# Patient Record
Sex: Female | Born: 1965 | Race: Black or African American | Hispanic: No | Smoking: Former smoker
Health system: Southern US, Community
[De-identification: ages and names within clinical notes are randomized; demographics above are authoritative.]

## PROBLEM LIST (undated history)

## (undated) DIAGNOSIS — G4700 Insomnia, unspecified: Secondary | ICD-10-CM

## (undated) DIAGNOSIS — E1140 Type 2 diabetes mellitus with diabetic neuropathy, unspecified: Secondary | ICD-10-CM

## (undated) DIAGNOSIS — F17200 Nicotine dependence, unspecified, uncomplicated: Secondary | ICD-10-CM

## (undated) DIAGNOSIS — R740 Nonspecific elevation of levels of transaminase and lactic acid dehydrogenase [LDH]: Secondary | ICD-10-CM

## (undated) DIAGNOSIS — N185 Chronic kidney disease, stage 5: Secondary | ICD-10-CM

## (undated) DIAGNOSIS — B182 Chronic viral hepatitis C: Secondary | ICD-10-CM

## (undated) DIAGNOSIS — Z7251 High risk heterosexual behavior: Secondary | ICD-10-CM

## (undated) DIAGNOSIS — R7402 Elevation of levels of lactic acid dehydrogenase (LDH): Secondary | ICD-10-CM

## (undated) DIAGNOSIS — R7401 Elevation of levels of liver transaminase levels: Secondary | ICD-10-CM

## (undated) DIAGNOSIS — R410 Disorientation, unspecified: Secondary | ICD-10-CM

## (undated) DIAGNOSIS — I10 Essential (primary) hypertension: Secondary | ICD-10-CM

## (undated) DIAGNOSIS — K219 Gastro-esophageal reflux disease without esophagitis: Secondary | ICD-10-CM

## (undated) DIAGNOSIS — N186 End stage renal disease: Secondary | ICD-10-CM

## (undated) DIAGNOSIS — H5440 Blindness, one eye, unspecified eye: Secondary | ICD-10-CM

## (undated) DIAGNOSIS — F32A Depression, unspecified: Secondary | ICD-10-CM

## (undated) DIAGNOSIS — F419 Anxiety disorder, unspecified: Secondary | ICD-10-CM

## (undated) DIAGNOSIS — E785 Hyperlipidemia, unspecified: Secondary | ICD-10-CM

## (undated) DIAGNOSIS — E162 Hypoglycemia, unspecified: Secondary | ICD-10-CM

## (undated) DIAGNOSIS — R809 Proteinuria, unspecified: Secondary | ICD-10-CM

## (undated) DIAGNOSIS — K5909 Other constipation: Secondary | ICD-10-CM

## (undated) DIAGNOSIS — A048 Other specified bacterial intestinal infections: Secondary | ICD-10-CM

## (undated) DIAGNOSIS — D649 Anemia, unspecified: Secondary | ICD-10-CM

## (undated) HISTORY — DX: High risk heterosexual behavior: Z72.51

## (undated) HISTORY — DX: Chronic viral hepatitis C: B18.2

## (undated) HISTORY — DX: Hyperlipidemia, unspecified: E78.5

## (undated) HISTORY — DX: Elevation of levels of liver transaminase levels: R74.01

## (undated) HISTORY — DX: Type 2 diabetes mellitus with diabetic neuropathy, unspecified: E11.40

## (undated) HISTORY — DX: Other constipation: K59.09

## (undated) HISTORY — DX: Blindness, one eye, unspecified eye: H54.40

## (undated) HISTORY — DX: Nonspecific elevation of levels of transaminase and lactic acid dehydrogenase (ldh): R74.0

## (undated) HISTORY — DX: Essential (primary) hypertension: I10

## (undated) HISTORY — DX: Chronic kidney disease, stage 5: N18.5

## (undated) HISTORY — DX: Other specified bacterial intestinal infections: A04.8

## (undated) HISTORY — DX: Insomnia, unspecified: G47.00

## (undated) HISTORY — PX: ESOPHAGOGASTRODUODENOSCOPY: SHX1529

## (undated) HISTORY — PX: COLONOSCOPY: SHX174

## (undated) HISTORY — DX: Anemia, unspecified: D64.9

## (undated) HISTORY — DX: Anxiety disorder, unspecified: F41.9

## (undated) HISTORY — DX: Gastro-esophageal reflux disease without esophagitis: K21.9

## (undated) HISTORY — DX: Proteinuria, unspecified: R80.9

## (undated) HISTORY — DX: Nicotine dependence, unspecified, uncomplicated: F17.200

## (undated) HISTORY — DX: Elevation of levels of lactic acid dehydrogenase (LDH): R74.02

---

## 1998-12-12 ENCOUNTER — Emergency Department (HOSPITAL_COMMUNITY): Admission: EM | Admit: 1998-12-12 | Discharge: 1998-12-12 | Payer: Self-pay | Admitting: Emergency Medicine

## 1999-05-28 ENCOUNTER — Emergency Department (HOSPITAL_COMMUNITY): Admission: EM | Admit: 1999-05-28 | Discharge: 1999-05-28 | Payer: Self-pay | Admitting: Emergency Medicine

## 1999-06-09 ENCOUNTER — Emergency Department (HOSPITAL_COMMUNITY): Admission: EM | Admit: 1999-06-09 | Discharge: 1999-06-09 | Payer: Self-pay | Admitting: Emergency Medicine

## 1999-10-26 ENCOUNTER — Emergency Department (HOSPITAL_COMMUNITY): Admission: EM | Admit: 1999-10-26 | Discharge: 1999-10-26 | Payer: Self-pay | Admitting: Emergency Medicine

## 1999-11-13 ENCOUNTER — Encounter: Payer: Self-pay | Admitting: Emergency Medicine

## 1999-11-13 ENCOUNTER — Emergency Department (HOSPITAL_COMMUNITY): Admission: EM | Admit: 1999-11-13 | Discharge: 1999-11-13 | Payer: Self-pay | Admitting: Emergency Medicine

## 2000-05-26 ENCOUNTER — Emergency Department (HOSPITAL_COMMUNITY): Admission: EM | Admit: 2000-05-26 | Discharge: 2000-05-26 | Payer: Self-pay | Admitting: Emergency Medicine

## 2001-06-05 ENCOUNTER — Emergency Department (HOSPITAL_COMMUNITY): Admission: EM | Admit: 2001-06-05 | Discharge: 2001-06-06 | Payer: Self-pay | Admitting: Emergency Medicine

## 2001-07-06 ENCOUNTER — Other Ambulatory Visit: Admission: RE | Admit: 2001-07-06 | Discharge: 2001-07-06 | Payer: Self-pay | Admitting: Family Medicine

## 2001-07-11 ENCOUNTER — Ambulatory Visit (HOSPITAL_COMMUNITY): Admission: RE | Admit: 2001-07-11 | Discharge: 2001-07-11 | Payer: Self-pay | Admitting: Gynecology

## 2002-07-08 ENCOUNTER — Emergency Department (HOSPITAL_COMMUNITY): Admission: EM | Admit: 2002-07-08 | Discharge: 2002-07-08 | Payer: Self-pay | Admitting: Emergency Medicine

## 2008-12-02 ENCOUNTER — Encounter: Payer: Self-pay | Admitting: Internal Medicine

## 2009-02-28 ENCOUNTER — Encounter: Payer: Self-pay | Admitting: Internal Medicine

## 2009-03-17 ENCOUNTER — Encounter: Payer: Self-pay | Admitting: Internal Medicine

## 2009-04-30 HISTORY — PX: ABDOMINAL HYSTERECTOMY: SHX81

## 2009-05-08 ENCOUNTER — Encounter: Payer: Self-pay | Admitting: Internal Medicine

## 2009-06-24 ENCOUNTER — Emergency Department (HOSPITAL_COMMUNITY): Admission: EM | Admit: 2009-06-24 | Discharge: 2009-06-24 | Payer: Self-pay | Admitting: Family Medicine

## 2009-08-06 ENCOUNTER — Ambulatory Visit: Payer: Self-pay | Admitting: Internal Medicine

## 2009-08-06 DIAGNOSIS — R1031 Right lower quadrant pain: Secondary | ICD-10-CM

## 2009-08-06 DIAGNOSIS — R079 Chest pain, unspecified: Secondary | ICD-10-CM

## 2009-08-06 DIAGNOSIS — R198 Other specified symptoms and signs involving the digestive system and abdomen: Secondary | ICD-10-CM

## 2009-08-06 DIAGNOSIS — K5900 Constipation, unspecified: Secondary | ICD-10-CM | POA: Insufficient documentation

## 2009-08-07 LAB — CONVERTED CEMR LAB
ALT: 27 units/L (ref 0–35)
AST: 32 units/L (ref 0–37)
Albumin: 3.9 g/dL (ref 3.5–5.2)
Alkaline Phosphatase: 68 units/L (ref 39–117)
BUN: 7 mg/dL (ref 6–23)
Basophils Absolute: 0 10*3/uL (ref 0.0–0.1)
Basophils Relative: 0.9 % (ref 0.0–3.0)
CO2: 28 meq/L (ref 19–32)
Calcium: 9.2 mg/dL (ref 8.4–10.5)
Chloride: 99 meq/L (ref 96–112)
Creatinine, Ser: 0.6 mg/dL (ref 0.4–1.2)
Eosinophils Absolute: 0.2 10*3/uL (ref 0.0–0.7)
Eosinophils Relative: 3.7 % (ref 0.0–5.0)
GFR calc non Af Amer: 139 mL/min (ref >60)
Glucose, Bld: 103 mg/dL — ABNORMAL HIGH (ref 70–99)
HCT: 36.5 % (ref 36.0–46.0)
Hemoglobin: 11.8 g/dL — ABNORMAL LOW (ref 12.0–15.0)
Lymphocytes Relative: 49 % — ABNORMAL HIGH (ref 12.0–46.0)
Lymphs Abs: 2 10*3/uL (ref 0.7–4.0)
MCHC: 32.3 g/dL (ref 30.0–36.0)
MCV: 77.6 fL — ABNORMAL LOW (ref 78.0–100.0)
Monocytes Absolute: 0.6 10*3/uL (ref 0.1–1.0)
Monocytes Relative: 14.1 % — ABNORMAL HIGH (ref 3.0–12.0)
Neutro Abs: 1.4 10*3/uL (ref 1.4–7.7)
Neutrophils Relative %: 32.3 % — ABNORMAL LOW (ref 43.0–77.0)
Platelets: 254 10*3/uL (ref 150.0–400.0)
Potassium: 4.2 meq/L (ref 3.5–5.1)
RBC: 4.7 M/uL (ref 3.87–5.11)
RDW: 21.3 % — ABNORMAL HIGH (ref 11.5–14.6)
Sodium: 139 meq/L (ref 135–145)
TSH: 1.92 microintl units/mL (ref 0.35–5.50)
Total Bilirubin: 0.6 mg/dL (ref 0.3–1.2)
Total Protein: 8.3 g/dL (ref 6.0–8.3)
WBC: 4.2 10*3/uL — ABNORMAL LOW (ref 4.5–10.5)

## 2009-08-08 LAB — CONVERTED CEMR LAB
Ferritin: 10.2 ng/mL (ref 10.0–291.0)
Folate: 6.5 ng/mL
Iron: 34 ug/dL — ABNORMAL LOW (ref 42–145)
Saturation Ratios: 5.5 % — ABNORMAL LOW (ref 20.0–50.0)
Transferrin: 439.7 mg/dL — ABNORMAL HIGH (ref 212.0–360.0)
Vitamin B-12: 1127 pg/mL — ABNORMAL HIGH (ref 211–911)

## 2009-08-15 ENCOUNTER — Encounter: Payer: Self-pay | Admitting: Internal Medicine

## 2009-08-15 ENCOUNTER — Ambulatory Visit: Payer: Self-pay | Admitting: Internal Medicine

## 2009-08-25 ENCOUNTER — Ambulatory Visit: Payer: Self-pay | Admitting: Family Medicine

## 2009-09-04 ENCOUNTER — Ambulatory Visit: Payer: Self-pay | Admitting: Family Medicine

## 2009-09-08 ENCOUNTER — Ambulatory Visit: Payer: Self-pay | Admitting: Family Medicine

## 2009-09-15 ENCOUNTER — Telehealth: Payer: Self-pay | Admitting: Internal Medicine

## 2009-11-08 ENCOUNTER — Encounter: Admission: RE | Admit: 2009-11-08 | Discharge: 2009-11-08 | Payer: Self-pay | Admitting: Neurology

## 2010-01-02 ENCOUNTER — Ambulatory Visit: Payer: Self-pay | Admitting: Family Medicine

## 2010-07-15 ENCOUNTER — Ambulatory Visit: Payer: Self-pay | Admitting: Family Medicine

## 2010-07-29 ENCOUNTER — Ambulatory Visit: Payer: Self-pay | Admitting: Family Medicine

## 2010-08-11 ENCOUNTER — Ambulatory Visit: Payer: Self-pay | Admitting: Endocrinology

## 2010-08-11 DIAGNOSIS — H409 Unspecified glaucoma: Secondary | ICD-10-CM | POA: Insufficient documentation

## 2010-08-11 DIAGNOSIS — I10 Essential (primary) hypertension: Secondary | ICD-10-CM | POA: Insufficient documentation

## 2010-08-11 DIAGNOSIS — H5440 Blindness, one eye, unspecified eye: Secondary | ICD-10-CM | POA: Insufficient documentation

## 2010-08-11 DIAGNOSIS — E876 Hypokalemia: Secondary | ICD-10-CM

## 2010-08-11 DIAGNOSIS — E7800 Pure hypercholesterolemia, unspecified: Secondary | ICD-10-CM | POA: Insufficient documentation

## 2010-08-11 DIAGNOSIS — F329 Major depressive disorder, single episode, unspecified: Secondary | ICD-10-CM | POA: Insufficient documentation

## 2010-08-11 DIAGNOSIS — H547 Unspecified visual loss: Secondary | ICD-10-CM | POA: Insufficient documentation

## 2010-08-11 DIAGNOSIS — E109 Type 1 diabetes mellitus without complications: Secondary | ICD-10-CM | POA: Insufficient documentation

## 2010-08-11 DIAGNOSIS — K219 Gastro-esophageal reflux disease without esophagitis: Secondary | ICD-10-CM

## 2010-08-18 ENCOUNTER — Ambulatory Visit: Payer: Self-pay | Admitting: Endocrinology

## 2010-08-18 DIAGNOSIS — M545 Low back pain, unspecified: Secondary | ICD-10-CM | POA: Insufficient documentation

## 2010-09-04 ENCOUNTER — Encounter (INDEPENDENT_AMBULATORY_CARE_PROVIDER_SITE_OTHER): Payer: Self-pay | Admitting: *Deleted

## 2010-09-10 ENCOUNTER — Telehealth: Payer: Self-pay | Admitting: Endocrinology

## 2010-09-21 ENCOUNTER — Encounter
Admission: RE | Admit: 2010-09-21 | Discharge: 2010-11-24 | Payer: Self-pay | Source: Home / Self Care | Attending: Physical Medicine & Rehabilitation | Admitting: Physical Medicine & Rehabilitation

## 2010-09-25 ENCOUNTER — Ambulatory Visit: Payer: Self-pay | Admitting: Physical Medicine & Rehabilitation

## 2010-10-12 ENCOUNTER — Encounter
Admission: RE | Admit: 2010-10-12 | Discharge: 2010-10-12 | Payer: Self-pay | Source: Home / Self Care | Attending: Physical Medicine & Rehabilitation | Admitting: Physical Medicine & Rehabilitation

## 2010-10-27 ENCOUNTER — Encounter
Admission: RE | Admit: 2010-10-27 | Discharge: 2010-11-24 | Payer: Self-pay | Source: Home / Self Care | Attending: Physical Medicine & Rehabilitation | Admitting: Physical Medicine & Rehabilitation

## 2010-11-02 ENCOUNTER — Ambulatory Visit
Admission: RE | Admit: 2010-11-02 | Discharge: 2010-11-02 | Payer: Self-pay | Source: Home / Self Care | Attending: Physical Medicine & Rehabilitation | Admitting: Physical Medicine & Rehabilitation

## 2010-11-15 ENCOUNTER — Encounter: Payer: Self-pay | Admitting: Neurology

## 2010-11-17 ENCOUNTER — Encounter
Admission: RE | Admit: 2010-11-17 | Discharge: 2010-11-24 | Payer: Self-pay | Source: Home / Self Care | Attending: Physical Medicine & Rehabilitation | Admitting: Physical Medicine & Rehabilitation

## 2010-11-24 NOTE — Assessment & Plan Note (Signed)
Summary: NEW ENDO CONSULT/ DM /EVERCARE/MEDICAID/NWS  #   Vital Signs:  Patient profile:   45 year old female Height:      61 inches (154.94 cm) Weight:      170.50 pounds (77.50 kg) BMI:     32.33 O2 Sat:      99 % on Room air Temp:     98.4 degrees F (36.89 degrees C) oral Pulse rate:   82 / minute BP sitting:   132 / 90  (left arm) Cuff size:   regular  Vitals Entered By: Rebeca Alert MA (August 11, 2010 11:13 AM)  O2 Flow:  Room air CC: New Endo/DM/aj Is Patient Diabetic? Yes   Referring Provider:  Donne Hazel MD Primary Provider:  n/a  CC:  New Endo/DM/aj.  History of Present Illness: pt states 10 years h/o dm.  she is unaware of any chronic complications.  she has been on insulin x 3 years.  she takes humulin 70/30 at a varying dosage.  she averages a total of approx 70 units per day.  no cbg record, but states cbg's are variable.   pt says her diet and exercise are "good."  symptomatically, pt states of 18 mos of intermittent moderate heartburn, but no assoc nausea.   oral surgeon is awaiting go-ahead for dental extraction.   Current Medications (verified): 1)  Lisinopril-Hydrochlorothiazide 20-12.5 Mg Tabs (Lisinopril-Hydrochlorothiazide) .... 2 By Mouth Once Daily 2)  Wellbutrin 100 Mg Tabs (Bupropion Hcl) .... One Tablet By Mouth Once Daily 3)  Ativan 2 Mg Tabs (Lorazepam) .... One Tablet By Mouth As Needed 4)  Humulin 70/30 70-30 % Susp (Insulin Isophane & Regular) .... 35 Units in The Morning and 35 Units At Night and Sliding Scale As Needed 5)  Omeprazole 40 Mg  Cpdr (Omeprazole) .Marland Kitchen.. 1 Twice A Day 30 Minutes Before The First Meal of The Day and At [facility] 6)  Vicodin 5-500 Mg Tabs (Hydrocodone-Acetaminophen) .... 1/2 To A Whole Tablet By Mouth As Needed For Back Pain 7)  Klor-Con 10 10 Meq Cr-Tabs (Potassium Chloride) .Marland Kitchen.. 1 By Mouth Once Daily 8)  Metoclopramide Hcl 10 Mg Tabs (Metoclopramide Hcl) .Marland Kitchen.. 1 By Mouth Once Daily 9)  Pravastatin Sodium 80 Mg  Tabs (Pravastatin Sodium) .Marland Kitchen.. 1 By Mouth Once Daily 10)  Trazodone Hcl 100 Mg Tabs (Trazodone Hcl) .Marland Kitchen.. 1 To 3 Tablets By Mouth At Bedtime 11)  Amlodipine Besylate 10 Mg Tabs (Amlodipine Besylate) .Marland Kitchen.. 1 By Mouth Once Daily 12)  Propranolol Hcl Cr 60 Mg Xr24h-Cap (Propranolol Hcl) .Marland Kitchen.. 1 By Mouth Once Daily 13)  Citalopram Hydrobromide 10 Mg Tabs (Citalopram Hydrobromide) .Marland Kitchen.. 1 By Mouth At Bedtime  Allergies (verified): 1)  ! 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 Family History of Colon Cancer:PGM Family History of Celiac Disease: Sister  Family History of Diabetes: Mother and Father   Social History: Reviewed history from 08/06/2009 and no changes required. Occupation:  Unemployed---Laid Off Married 2 childern Patient currently smokes.  Alcohol Use - yes: On weekends 6 pack of beer  or sharing a 5th of liquor  Daily Caffeine Use: 2 sodas weekly Illicit Drug Use - no Patient does not get regular exercise.  Patient has been counseled to quit smoking  Review of Systems       The patient complains of weight gain, headaches, and depression.         denies change in vision, sob, memory loss, hypoglycemia, and easy bruising.  w/u of chest pain by cardiol was neg.  she has vomiting very seldom.  she reports polyuria, constipation, excessive diaphoresis, rhinorrhea, and leg cramps.  Physical Exam  General:  obese.  no distress  Head:  head: no deformity eyes: no periorbital swelling, no proptosis external nose and ears are normal mouth: no lesion  seen Neck:  Supple without thyroid enlargement or tenderness.  Lungs:  Clear to auscultation bilaterally. Normal respiratory effort.  Heart:  Regular rate and rhythm without murmurs or gallops noted. Normal S1,S2.   Abdomen:  abdomen is soft, nontender.  no hepatosplenomegaly.   not distended.  no hernia  Msk:  muscle bulk and strength are grossly normal.  no obvious joint swelling.  gait is normal and steady  Pulses:  dorsalis pedis intact bilat.  no carotid bruit  Extremities:  no deformity.  no ulcer on the feet.  feet are of normal color and temp.  no edema  Neurologic:  cn 2-12 grossly intact.   readily moves all 4's.   sensation is intact to touch on the feet  Skin:  normal texture and temp.  no rash.  not diaphoretic  Cervical Nodes:  No significant adenopathy.  Psych:  Alert and cooperative; normal mood and affect; normal attention span and concentration.     Impression & Recommendations:  Problem # 1:  IDDM (ICD-250.01) needs increased rx the first priority is improving control so she can get her dental work done.    Problem # 2:  DEPRESSION (123456) this complicates the rx of #1  Problem # 3:  weight gain this complicates the rx of #1  Medications Added to Medication List This Visit: 1)  Lisinopril-hydrochlorothiazide 20-12.5 Mg Tabs (Lisinopril-hydrochlorothiazide) .... 2 by mouth once daily 2)  Humulin 70/30 70-30 % Susp (Insulin isophane & regular) .... 35 units in the morning and 35 units at night and sliding scale as needed 3)  Humalog Mix 75/25 Kwikpen 75-25 % Susp (Insulin lispro prot & lispro) .... 45 units two times a day (with 1st and last meals of the day), and pen needles two times a day 4)  Klor-con 10 10 Meq Cr-tabs (Potassium chloride) .Marland Kitchen.. 1 by mouth once daily 5)  Metoclopramide Hcl 10 Mg Tabs (Metoclopramide hcl) .Marland Kitchen.. 1 by mouth once daily 6)  Pravastatin Sodium 80 Mg Tabs (Pravastatin sodium) .Marland Kitchen.. 1 by mouth once daily 7)  Trazodone Hcl 100 Mg  Tabs (Trazodone hcl) .Marland Kitchen.. 1 to 3 tablets by mouth at bedtime 8)  Amlodipine Besylate 10 Mg Tabs (Amlodipine besylate) .Marland Kitchen.. 1 by mouth once daily 9)  Propranolol Hcl Cr 60 Mg Xr24h-cap (Propranolol hcl) .Marland Kitchen.. 1 by mouth once daily 10)  Citalopram Hydrobromide 10 Mg Tabs (Citalopram hydrobromide) .Marland Kitchen.. 1 by mouth at bedtime  Other Orders: Consultation Level IV [id number])  Patient Instructions: 1)  good diet and exercise habits significanly improve the control of your diabetes.  please let me know if you wish to be referred to a dietician.  high blood sugar is very risky to your health.  you should see an eye doctor every year. 2)  controlling your blood pressure and cholesterol drastically reduces the damage diabetes does to your body.  this also applies to quitting smoking.  please discuss these with your doctor.  you should take an aspirin every day, unless you have been advised by a doctor not to. 3)  check your blood sugar 2 times a day.  vary the time of day when you check, between before the 3 meals, and at bedtime.  also check if you have symptoms of your blood sugar being too high or too low.  please keep a record of the readings and bring it to your next appointment here.  please call us sooner if you are having low blood sugar episodes. 4)  we will need to take this complex situation in stages. 5)  for now, change insulin to humalog 75/25, 45 units two times a day 6)  Please schedule a follow-up appointment in 1 week. Prescriptions: HUMALOG MIX 75/25 KWIKPEN 75-25 % SUSP (INSULIN LISPRO PROT & LISPRO) 45 units two times a day (with 1st and last meals of the day), and pen needles two times a day  #2 boxes x 11   Entered and Authorized by:   Donavan Foil MD   Signed by:   Donavan Foil MD on 08/11/2010   Method used:   Electronically to        [facility] (retail)       [address]       [place], [place]  [id number]       Ph: [phone]       Fax: [phone]    RxID:   [id number]    Orders Added: 1)  Consultation Level IV [id number]     Preventive Care Screening  Last Tetanus Booster:    Date:  10/25/2004    Results:  Historical

## 2010-11-24 NOTE — Assessment & Plan Note (Signed)
Summary: 1 WK FU---STC   Vital Signs:  Patient profile:   45 year old female Height:      61 inches (154.94 cm) Weight:      177 pounds (80.45 kg) BMI:     33.56 O2 Sat:      97 % on Room air Temp:     98.5 degrees F (36.94 degrees C) oral Pulse rate:   95 / minute BP sitting:   132 / 84  (left arm) Cuff size:   regular  Vitals Entered By: Rebeca Alert MA (August 18, 2010 2:51 PM)  O2 Flow:  Room air CC: 1 week F/U/discuss medications/aj Is Patient Diabetic? Yes   Referring Provider:  Donne Hazel MD Primary Provider:  n/a  CC:  1 week F/U/discuss medications/aj.  History of Present Illness: she brings a record of her cbg's which i have reviewed today.  it varies from 120-250.  it is in general lowest before lunch and in the afternoon.  pt states she feels well in general, except for a few lbs of weight gain.   pt states 2 years of severe pain at the lower back, and assoc numbness of the feet.  she has had an mri done for this.  Current Medications (verified): 1)  Lisinopril-Hydrochlorothiazide 20-12.5 Mg Tabs (Lisinopril-Hydrochlorothiazide) .... 2 By Mouth Once Daily 2)  Wellbutrin 100 Mg Tabs (Bupropion Hcl) .... One Tablet By Mouth Once Daily 3)  Ativan 2 Mg Tabs (Lorazepam) .... One Tablet By Mouth As Needed 4)  Humalog Mix 75/25 Kwikpen 75-25 % Susp (Insulin Lispro Prot & Lispro) .... 45 Units Two Times A Day (With 1st and Last Meals of The Day), and Pen Needles Two Times A Day 5)  Omeprazole 40 Mg  Cpdr (Omeprazole) .Marland Kitchen.. 1 Twice A Day 30 Minutes Before The First Meal of The Day and At [facility] 6)  Vicodin 5-500 Mg Tabs (Hydrocodone-Acetaminophen) .... 1/2 To A Whole Tablet By Mouth As Needed For Back Pain 7)  Klor-Con 10 10 Meq Cr-Tabs (Potassium Chloride) .Marland Kitchen.. 1 By Mouth Once Daily 8)  Metoclopramide Hcl 10 Mg Tabs (Metoclopramide Hcl) .Marland Kitchen.. 1 By Mouth Once Daily 9)  Pravastatin Sodium 80 Mg Tabs (Pravastatin Sodium) .Marland Kitchen.. 1 By Mouth Once Daily 10)  Trazodone Hcl 100  Mg Tabs (Trazodone Hcl) .Marland Kitchen.. 1 To 3 Tablets By Mouth At Bedtime 11)  Amlodipine Besylate 10 Mg Tabs (Amlodipine Besylate) .Marland Kitchen.. 1 By Mouth Once Daily 12)  Propranolol Hcl Cr 60 Mg Xr24h-Cap (Propranolol Hcl) .Marland Kitchen.. 1 By Mouth Once Daily 13)  Citalopram Hydrobromide 10 Mg Tabs (Citalopram Hydrobromide) .Marland Kitchen.. 1 By Mouth At Bedtime  Allergies (verified): 1)  ! 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         she also has chronic leg and foot pain  Physical Exam  General:  obese.  no distress  Msk:  gait is normal and steady. spine is nontender. Neurologic:  sensation is intact to touch on the feet    Impression & Recommendations:  Problem # 1:  IDDM (ICD-250.01) needs increased rx  Problem # 2:  BACK PAIN, LUMBAR (ICD-724.2) usually due to oa  Medications Added to Medication List This Visit: 1)  Humalog Mix 75/25 Claiborne Rigg  75-25 % Susp (Insulin lispro prot & lispro) .... 45 units with 1st meal of the day, and 50 units with the last meal of the day, and pen needles two times a day  Other Orders: Ophthalmology Referral (Ophthalmology) Pain Clinic Referral (Pain) Diabetic Clinic Referral (Diabetic) Est. Patient Level IV [id number])  Patient Instructions: 1)  check your blood sugar 2 times a day.  vary the time of day when you check, between before the 3 meals, and at bedtime.  also check if you have symptoms of your blood sugar being too high or too low.  please keep a record of the readings and bring it to your next appointment here.  please call us sooner if  you are having low blood sugar episodes. 2)  increase humalog 75/25, to 45 units with breakfast, and 50 units with the evening meal. 3)  Please schedule a follow-up appointment in 1 month. 4)  your surgical risk is low and outweighed by the potential benefit of the dental surgery.  you are therefore medically cleared, from the standpoint of the diabetes only.  5)  refer to a dietician.  you will be called with a day and time for an appointment. 6)  please see dr Asa Lente or dr Ronnald Ramp, to be your new primary doctor.   7)  refer pain specialist.  you will be called with a day and time for an appointment. 8)  refer eye doctor.  you will be called with a day and time for an appointment   Orders Added: 1)  Ophthalmology Referral [Ophthalmology] 2)  Pain Clinic Referral [Pain] 3)  Diabetic Clinic Referral [Diabetic] 4)  Est. Patient Level IV [id number]

## 2010-11-24 NOTE — Letter (Signed)
Summary: [facility] Consult Scheduled Letter  [facility]   [place], [place] [address]   Phone: [phone]  Fax: [phone]      09/04/2010 MRN: [id number]  Tammy Moses [address] [place], [place]  [address]    Dear Tammy Moses,      We have scheduled an appointment for you.  At the recommendation of Dr.Ellison, we have scheduled you a consult with Dr Letta Pate on 09/25/10 at 10:45am.  Their phone number is [phone].  If this appointment day and time is not convenient for you, please feel free to call the office of the doctor you are being referred to at the number listed above and reschedule the appointment.    [facility] [address] [place], [place] [address]    Thank you,  Patient Care Coordinator [facility]

## 2010-11-24 NOTE — Progress Notes (Signed)
Summary: Med Clearance  Phone Note Call from Patient Call back at Home Phone [phone]   Caller: Patient Summary of Call: Pt called requesting Medical Clearance for tooth extraction 11/22 at [facility] [phone] Fax. Initial call taken by: Crissie Sickles, CMA,  September 10, 2010 4:08 PM  Follow-up for Phone Call        please fax 08/18/10 note Follow-up by: Donavan Foil MD,  September 10, 2010 4:22 PM  Additional Follow-up for Phone Call Additional follow up Details #1::        Note faxed to requested faxed number Additional Follow-up by: Crissie Sickles, [place],  September 10, 2010 4:26 PM

## 2010-11-25 ENCOUNTER — Encounter: Payer: Self-pay | Admitting: Physical Therapy

## 2010-11-27 ENCOUNTER — Encounter: Payer: Self-pay | Admitting: Physical Therapy

## 2010-11-30 ENCOUNTER — Encounter: Payer: Self-pay | Admitting: Physical Therapy

## 2010-12-02 ENCOUNTER — Encounter: Payer: Self-pay | Admitting: Physical Therapy

## 2010-12-04 ENCOUNTER — Encounter: Payer: Self-pay | Admitting: Occupational Therapy

## 2011-01-30 LAB — POCT URINALYSIS DIP (DEVICE)
Bilirubin Urine: NEGATIVE
Glucose, UA: NEGATIVE mg/dL
Ketones, ur: NEGATIVE mg/dL
Nitrite: NEGATIVE

## 2011-01-30 LAB — URINE CULTURE
Colony Count: NO GROWTH
Culture: NO GROWTH

## 2011-03-12 ENCOUNTER — Inpatient Hospital Stay (INDEPENDENT_AMBULATORY_CARE_PROVIDER_SITE_OTHER)
Admission: RE | Admit: 2011-03-12 | Discharge: 2011-03-12 | Disposition: A | Discharge status: Home / Self Care | Payer: Medicaid Other | Source: Ambulatory Visit | Attending: Emergency Medicine | Admitting: Emergency Medicine

## 2011-03-12 ENCOUNTER — Ambulatory Visit (INDEPENDENT_AMBULATORY_CARE_PROVIDER_SITE_OTHER): Payer: Medicaid Other

## 2011-03-12 DIAGNOSIS — S92919A Unspecified fracture of unspecified toe(s), initial encounter for closed fracture: Secondary | ICD-10-CM

## 2011-03-12 DIAGNOSIS — I10 Essential (primary) hypertension: Secondary | ICD-10-CM

## 2011-05-24 ENCOUNTER — Other Ambulatory Visit: Payer: Self-pay

## 2011-05-24 MED ORDER — AMLODIPINE BESYLATE 10 MG PO TABS: 10.0000 mg | ORAL_TABLET | Freq: Every day | ORAL | Status: DC

## 2011-05-25 ENCOUNTER — Encounter: Payer: Self-pay | Admitting: Family Medicine

## 2011-08-16 ENCOUNTER — Telehealth: Payer: Self-pay | Admitting: *Deleted

## 2011-08-16 NOTE — Telephone Encounter (Signed)
Pt called and advised that her symptoms have not improved at all since Thursday and wanted to know if there is anything else she needs to do. She also wants to check on the referral to Orthopedic

## 2011-08-16 NOTE — Telephone Encounter (Signed)
i have never seen this pt (that i can tell)

## 2011-08-16 NOTE — Telephone Encounter (Signed)
Documented on chart in error

## 2012-07-24 ENCOUNTER — Encounter (HOSPITAL_COMMUNITY): Payer: Self-pay | Admitting: Family Medicine

## 2012-07-24 ENCOUNTER — Other Ambulatory Visit: Payer: Self-pay

## 2012-07-24 ENCOUNTER — Emergency Department (HOSPITAL_COMMUNITY)
Admission: EM | Admit: 2012-07-24 | Discharge: 2012-07-24 | Disposition: A | Discharge status: Home / Self Care | Payer: PRIVATE HEALTH INSURANCE | Attending: Emergency Medicine | Admitting: Emergency Medicine

## 2012-07-24 ENCOUNTER — Emergency Department (HOSPITAL_COMMUNITY): Payer: PRIVATE HEALTH INSURANCE

## 2012-07-24 DIAGNOSIS — R071 Chest pain on breathing: Secondary | ICD-10-CM | POA: Insufficient documentation

## 2012-07-24 DIAGNOSIS — H5440 Blindness, one eye, unspecified eye: Secondary | ICD-10-CM | POA: Insufficient documentation

## 2012-07-24 DIAGNOSIS — F17200 Nicotine dependence, unspecified, uncomplicated: Secondary | ICD-10-CM | POA: Insufficient documentation

## 2012-07-24 DIAGNOSIS — R05 Cough: Secondary | ICD-10-CM | POA: Insufficient documentation

## 2012-07-24 DIAGNOSIS — E119 Type 2 diabetes mellitus without complications: Secondary | ICD-10-CM | POA: Insufficient documentation

## 2012-07-24 DIAGNOSIS — Z79899 Other long term (current) drug therapy: Secondary | ICD-10-CM | POA: Insufficient documentation

## 2012-07-24 DIAGNOSIS — R0789 Other chest pain: Secondary | ICD-10-CM

## 2012-07-24 DIAGNOSIS — D649 Anemia, unspecified: Secondary | ICD-10-CM | POA: Insufficient documentation

## 2012-07-24 DIAGNOSIS — E785 Hyperlipidemia, unspecified: Secondary | ICD-10-CM | POA: Insufficient documentation

## 2012-07-24 DIAGNOSIS — Z794 Long term (current) use of insulin: Secondary | ICD-10-CM | POA: Insufficient documentation

## 2012-07-24 DIAGNOSIS — K219 Gastro-esophageal reflux disease without esophagitis: Secondary | ICD-10-CM | POA: Insufficient documentation

## 2012-07-24 DIAGNOSIS — R059 Cough, unspecified: Secondary | ICD-10-CM | POA: Insufficient documentation

## 2012-07-24 DIAGNOSIS — I10 Essential (primary) hypertension: Secondary | ICD-10-CM | POA: Insufficient documentation

## 2012-07-24 LAB — GLUCOSE, CAPILLARY

## 2012-07-24 LAB — BASIC METABOLIC PANEL
BUN: 16 mg/dL (ref 6–23)
Calcium: 9.8 mg/dL (ref 8.4–10.5)
GFR calc Af Amer: >90 mL/min (ref >90)
GFR calc non Af Amer: >90 mL/min (ref >90)
Potassium: 4.5 mEq/L (ref 3.5–5.1)
Sodium: 128 mEq/L — ABNORMAL LOW (ref 135–145)

## 2012-07-24 LAB — CBC
HCT: 41.6 % (ref 36.0–46.0)
MCHC: 36.8 g/dL — ABNORMAL HIGH (ref 30.0–36.0)
RDW: 12.6 % (ref 11.5–15.5)

## 2012-07-24 LAB — POCT I-STAT TROPONIN I: Troponin i, poc: 0 ng/mL (ref 0.00–0.08)

## 2012-07-24 MED ORDER — HYDROCOD POLST-CHLORPHEN POLST 10-8 MG/5ML PO LQCR: 5.0000 mL | Freq: Two times a day (BID) | ORAL | Status: DC | PRN

## 2012-07-24 MED ORDER — SODIUM CHLORIDE 0.9 % IV BOLUS (SEPSIS)
1000.0000 mL | Freq: Once | INTRAVENOUS | Status: AC
  Administered 2012-07-24: 1000 mL via INTRAVENOUS

## 2012-07-24 NOTE — ED Notes (Signed)
Pt having 3 days of chest pain radiating down left arm and tingling. sts also increased BP and nasal congestion. Being treated for possible Pneumonia.

## 2012-07-24 NOTE — ED Provider Notes (Signed)
History     CSN: [id number]  Arrival date & time 07/24/12  1255   First MD Initiated Contact with Patient 07/24/12 1717      Chief Complaint  Patient presents with  . Chest Pain    (Consider location/radiation/quality/duration/timing/severity/associated sxs/prior treatment)  Patient is a 46 y.o. female presenting with chest pain.  Chest Pain The chest pain began 3 - 5 days ago. Chest pain occurs constantly. The chest pain is improving. The pain is associated with coughing. At its most intense, the pain is at 5/10. The pain is currently at 1/10. The severity of the pain is mild. The quality of the pain is described as aching. The pain does not radiate. Chest pain is worsened by certain positions. Primary symptoms include cough. Pertinent negatives for primary symptoms include no fever, no fatigue, no syncope, no shortness of breath, no wheezing, no palpitations, no abdominal pain, no nausea, no vomiting, no dizziness and no altered mental status.  Pertinent negatives for associated symptoms include no claudication, no diaphoresis, no lower extremity edema, no near-syncope, no numbness, no orthopnea, no paroxysmal nocturnal dyspnea and no weakness. She tried aspirin for the symptoms. Risk factors include lack of exercise, sedentary lifestyle and smoking/tobacco exposure (Diabetes, HTN).  Her past medical history is significant for diabetes, hyperlipidemia and hypertension.  Her family medical history is significant for CAD in family, diabetes in family and heart disease in family.  Procedure history is positive for echocardiogram (at OSH).        Past Medical History  Diagnosis Date  . Hypertension   . Hyperlipidemia   . Diabetes mellitus   . GERD (gastroesophageal reflux disease)   . Constipation, chronic   . Glaucoma   . Diabetic retinopathy   . Blind right eye   . Anemia   . Dental caries   . High risk sexual behavior   . Vitamin d deficiency   . Microalbuminuria     Past  Surgical History  Procedure Date  . Abdominal hysterectomy 04/30/2009    PARTIAL    History reviewed. No pertinent family history.  History  Substance Use Topics  . Smoking status: Current Every Day Smoker  . Smokeless tobacco: Not on file  . Alcohol Use: Yes    OB History    Grav Para Term Preterm Abortions TAB SAB Ect Mult Living                  Review of Systems  Constitutional: Negative for fever, chills, diaphoresis, activity change, appetite change and fatigue.  HENT: Negative for ear pain, congestion, rhinorrhea and neck pain.   Eyes: Negative for pain.  Respiratory: Positive for cough. Negative for shortness of breath and wheezing.   Cardiovascular: Positive for chest pain. Negative for palpitations, orthopnea, claudication, syncope and near-syncope.  Gastrointestinal: Negative for nausea, vomiting and abdominal pain.  Genitourinary: Negative for dysuria, difficulty urinating and pelvic pain.  Musculoskeletal: Negative for back pain.  Skin: Negative for rash and wound.  Neurological: Negative for dizziness, weakness, numbness and headaches.  Psychiatric/Behavioral: Negative for behavioral problems, confusion, agitation and altered mental status.    Allergies  Metoclopramide hcl  Home Medications   Current Outpatient Rx  Name Route Sig Dispense Refill  . ALBUTEROL SULFATE HFA 108 (90 BASE) MCG/ACT IN AERS Inhalation Inhale 2 puffs into the lungs every 4 (four) hours as needed. For shortness of breath    . AZITHROMYCIN 250 MG PO TABS Oral Take 250-500 mg by mouth daily. zpak  5 day course started 3 days ago    . CITALOPRAM HYDROBROMIDE 10 MG PO TABS Oral Take 10 mg by mouth daily.      . INSULIN LISPRO PROT & LISPRO (75-25) 100 UNIT/ML [place] SUSP Subcutaneous Inject 40 Units into the skin 2 (two) times daily.    Marland Kitchen LISINOPRIL 40 MG PO TABS Oral Take 40 mg by mouth daily.    Marland Kitchen PROPRANOLOL HCL ER 60 MG PO CP24 Oral Take 60 mg by mouth daily.    . TRAZODONE HCL 100 MG  PO TABS Oral Take 100 mg by mouth at bedtime as needed. For sleep    . BUPROPION HCL ER (XL) 150 MG PO TB24 Oral Take 150 mg by mouth daily.      Marland Kitchen PRAVASTATIN SODIUM 80 MG PO TABS Oral Take 80 mg by mouth daily.        BP 140/90  Pulse 78  Temp 98 F (36.7 C)  Resp 18  SpO2 99%  Physical Exam  Constitutional: She is oriented to person, place, and time. She appears well-developed and well-nourished. No distress.  HENT:  Head: Normocephalic and atraumatic.  Nose: Nose normal.  Mouth/Throat: Oropharynx is clear and moist.  Eyes: EOM are normal. Pupils are equal, round, and reactive to light.  Neck: Normal range of motion. Neck supple. No tracheal deviation present.  Cardiovascular: Normal rate, regular rhythm, normal heart sounds and intact distal pulses.   Pulmonary/Chest: Effort normal and breath sounds normal. No respiratory distress. She has no wheezes. She has no rales. She exhibits tenderness ( Left upper chest wall).  Abdominal: Soft. Bowel sounds are normal. She exhibits no distension. There is no tenderness. There is no rebound and no guarding.  Musculoskeletal: Normal range of motion. She exhibits no tenderness.  Neurological: She is alert and oriented to person, place, and time.       L foot drop has had it for a couple weeks.   Skin: Skin is warm and dry. No rash noted.  Psychiatric: She has a normal mood and affect. Her behavior is normal.    ED Course  Procedures (including critical care time)     Results for orders placed during the hospital encounter of 07/24/12  POCT I-STAT TROPONIN I      Component Value Range   Troponin i, poc 0.00  0.00 - 0.08 ng/mL   Comment 3           CBC      Component Value Range   WBC 3.8 (*) 4.0 - 10.5 K/uL   RBC 4.69  3.87 - 5.11 MIL/uL   Hemoglobin 15.3 (*) 12.0 - 15.0 g/dL   HCT 41.6  36.0 - 46.0 %   MCV 88.7  78.0 - 100.0 fL   MCH 32.6  26.0 - 34.0 pg   MCHC 36.8 (*) 30.0 - 36.0 g/dL   RDW 12.6  11.5 - 15.5 %   Platelets  189  150 - 400 K/uL  BASIC METABOLIC PANEL      Component Value Range   Sodium 128 (*) 135 - 145 mEq/L   Potassium 4.5  3.5 - 5.1 mEq/L   Chloride 91 (*) 96 - 112 mEq/L   CO2 25  19 - 32 mEq/L   Glucose, Bld 426 (*) 70 - 99 mg/dL   BUN 16  6 - 23 mg/dL   Creatinine, Ser 0.71  0.50 - 1.10 mg/dL   Calcium 9.8  8.4 - 10.5 mg/dL  GFR calc non Af Amer >90  >90 mL/min   GFR calc Af Amer >90  >90 mL/min  GLUCOSE, CAPILLARY      Component Value Range   Glucose-Capillary 370 (*) 70 - 99 mg/dL   Comment 1 Notify RN     Comment 2 Documented in Chart    POCT I-STAT TROPONIN I      Component Value Range   Troponin i, poc 0.00  0.00 - 0.08 ng/mL   Comment 3           GLUCOSE, CAPILLARY      Component Value Range   Glucose-Capillary 277 (*) 70 - 99 mg/dL   Comment 1 Documented in Chart     Comment 2 Notify RN          DG Chest 2 View (Final result)   Result time:07/24/12 1405    Final result by Rad Results In Interface (07/24/12 14:05:01)    Narrative:   *RADIOLOGY REPORT*  Clinical Data: Left-sided chest pain, wheezing  CHEST - 2 VIEW  Comparison: None.  Findings: Normal cardiac silhouette. No effusion, infiltrate, pneumothorax. No acute osseous abnormality.  IMPRESSION: No acute cardiopulmonary process.   Original Report Authenticated By: Suzy Bouchard, M.D.      1. Chest wall pain   2. Cough       MDM    46 yo F in no acute distress, afebrile, vital signs stable, non toxic appearing with hx of HT, hyperlipidemia, DM, smoker who is non compliant with medications who presents with couple days hx of L upper chest pain and cough. She was seen at urgent care center on Saturday and given a Z pack for possible bronchitis. Patient now reports continued chest pain , non radiating, not associated with nausea, vomiting or diaphoresis. Negative troponin x 2. EKG without ST segment elevations. NSR. Doubt ACS. Patient non hypoxic not tachycardic hx and exam not consistent with  PE. Chest pain reproducible on exam and movement of L arm. Most likely chest wall pain. Thorough discussion with patient on smoking cessation, plan, return precautions and encouraged compliance of current medication regimen. She expressed understanding.  Will follow up with PCP.         Ruthell Rummage, MD 07/25/12 [phone]

## 2012-07-24 NOTE — ED Notes (Signed)
Patient states she has been having left sided chest pain with nausea and diapharesis since Thursday. She also states she has been having a stabbing, tingling feeling in her left arm. Husband states she has been slow in her responses. He also states she has been non-compliant with her insulin and her sugars have been running over 300.

## 2012-07-24 NOTE — ED Notes (Signed)
Called lab states has blood and will run test CBC and BMP

## 2012-07-25 NOTE — ED Provider Notes (Signed)
I saw and evaluated the patient, reviewed the resident's note and I agree with the findings and plan.   .Face to face Exam:  General:  Awake HEENT:  Atraumatic Resp:  Normal effort Abd:  Nondistended Neuro:No focal weakness Lymph: No adenopathy   Adya Wirz L Jamillia Closson, MD 07/25/12 2018 

## 2012-10-09 DIAGNOSIS — I120 Hypertensive chronic kidney disease with stage 5 chronic kidney disease or end stage renal disease: Secondary | ICD-10-CM | POA: Insufficient documentation

## 2012-10-09 DIAGNOSIS — Z131 Encounter for screening for diabetes mellitus: Secondary | ICD-10-CM | POA: Insufficient documentation

## 2012-10-09 DIAGNOSIS — N185 Chronic kidney disease, stage 5: Secondary | ICD-10-CM | POA: Insufficient documentation

## 2012-10-12 DIAGNOSIS — Z961 Presence of intraocular lens: Secondary | ICD-10-CM | POA: Insufficient documentation

## 2012-10-12 DIAGNOSIS — H401190 Primary open-angle glaucoma, unspecified eye, stage unspecified: Secondary | ICD-10-CM | POA: Insufficient documentation

## 2012-10-13 DIAGNOSIS — K219 Gastro-esophageal reflux disease without esophagitis: Secondary | ICD-10-CM

## 2012-10-13 DIAGNOSIS — J45909 Unspecified asthma, uncomplicated: Secondary | ICD-10-CM | POA: Insufficient documentation

## 2012-10-30 DIAGNOSIS — Z961 Presence of intraocular lens: Secondary | ICD-10-CM | POA: Insufficient documentation

## 2013-03-20 ENCOUNTER — Other Ambulatory Visit: Payer: Self-pay

## 2013-03-20 DIAGNOSIS — Z1231 Encounter for screening mammogram for malignant neoplasm of breast: Secondary | ICD-10-CM

## 2013-04-24 ENCOUNTER — Ambulatory Visit: Payer: Medicaid Other

## 2013-09-15 ENCOUNTER — Emergency Department (HOSPITAL_COMMUNITY): Payer: PRIVATE HEALTH INSURANCE

## 2013-09-15 ENCOUNTER — Encounter (HOSPITAL_COMMUNITY): Payer: Self-pay | Admitting: Emergency Medicine

## 2013-09-15 ENCOUNTER — Emergency Department (HOSPITAL_COMMUNITY)
Admission: EM | Admit: 2013-09-15 | Discharge: 2013-09-15 | Payer: PRIVATE HEALTH INSURANCE | Attending: Emergency Medicine | Admitting: Emergency Medicine

## 2013-09-15 DIAGNOSIS — H409 Unspecified glaucoma: Secondary | ICD-10-CM | POA: Insufficient documentation

## 2013-09-15 DIAGNOSIS — E785 Hyperlipidemia, unspecified: Secondary | ICD-10-CM | POA: Insufficient documentation

## 2013-09-15 DIAGNOSIS — H5440 Blindness, one eye, unspecified eye: Secondary | ICD-10-CM | POA: Insufficient documentation

## 2013-09-15 DIAGNOSIS — Y9389 Activity, other specified: Secondary | ICD-10-CM | POA: Insufficient documentation

## 2013-09-15 DIAGNOSIS — F17200 Nicotine dependence, unspecified, uncomplicated: Secondary | ICD-10-CM | POA: Insufficient documentation

## 2013-09-15 DIAGNOSIS — S0003XA Contusion of scalp, initial encounter: Secondary | ICD-10-CM | POA: Insufficient documentation

## 2013-09-15 DIAGNOSIS — Y92410 Unspecified street and highway as the place of occurrence of the external cause: Secondary | ICD-10-CM | POA: Insufficient documentation

## 2013-09-15 DIAGNOSIS — I10 Essential (primary) hypertension: Secondary | ICD-10-CM | POA: Insufficient documentation

## 2013-09-15 DIAGNOSIS — K219 Gastro-esophageal reflux disease without esophagitis: Secondary | ICD-10-CM | POA: Insufficient documentation

## 2013-09-15 DIAGNOSIS — K5900 Constipation, unspecified: Secondary | ICD-10-CM | POA: Insufficient documentation

## 2013-09-15 DIAGNOSIS — Z794 Long term (current) use of insulin: Secondary | ICD-10-CM | POA: Insufficient documentation

## 2013-09-15 DIAGNOSIS — E1139 Type 2 diabetes mellitus with other diabetic ophthalmic complication: Secondary | ICD-10-CM | POA: Insufficient documentation

## 2013-09-15 DIAGNOSIS — R51 Headache: Secondary | ICD-10-CM | POA: Insufficient documentation

## 2013-09-15 DIAGNOSIS — R404 Transient alteration of awareness: Secondary | ICD-10-CM | POA: Insufficient documentation

## 2013-09-15 DIAGNOSIS — E11319 Type 2 diabetes mellitus with unspecified diabetic retinopathy without macular edema: Secondary | ICD-10-CM | POA: Insufficient documentation

## 2013-09-15 DIAGNOSIS — Z79899 Other long term (current) drug therapy: Secondary | ICD-10-CM | POA: Insufficient documentation

## 2013-09-15 DIAGNOSIS — IMO0001 Reserved for inherently not codable concepts without codable children: Secondary | ICD-10-CM | POA: Insufficient documentation

## 2013-09-15 MED ORDER — IBUPROFEN 200 MG PO TABS: 400.0000 mg | ORAL_TABLET | ORAL | Status: DC | PRN

## 2013-09-15 MED ORDER — ACETAMINOPHEN 325 MG PO TABS
650.0000 mg | ORAL_TABLET | Freq: Once | ORAL | Status: AC
  Administered 2013-09-15: 650 mg via ORAL
  Filled 2013-09-15: qty 2
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
## 2013-09-15 NOTE — ED Provider Notes (Signed)
Medical screening examination/treatment/procedure(s) were performed by non-physician practitioner and as supervising physician I was immediately available for consultation/collaboration.    Kalman Drape, MD 09/15/13 [phone]
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
## 2013-11-20 ENCOUNTER — Encounter: Payer: Self-pay | Admitting: Internal Medicine

## 2013-11-21 ENCOUNTER — Emergency Department (HOSPITAL_COMMUNITY)
Admission: EM | Admit: 2013-11-21 | Discharge: 2013-11-21 | Disposition: A | Discharge status: Home / Self Care | Payer: Medicare Other | Attending: Emergency Medicine | Admitting: Emergency Medicine

## 2013-11-21 ENCOUNTER — Encounter (HOSPITAL_COMMUNITY): Payer: Self-pay | Admitting: Emergency Medicine

## 2013-11-21 DIAGNOSIS — E119 Type 2 diabetes mellitus without complications: Secondary | ICD-10-CM

## 2013-11-21 DIAGNOSIS — Z79899 Other long term (current) drug therapy: Secondary | ICD-10-CM | POA: Insufficient documentation

## 2013-11-21 DIAGNOSIS — Z862 Personal history of diseases of the blood and blood-forming organs and certain disorders involving the immune mechanism: Secondary | ICD-10-CM | POA: Insufficient documentation

## 2013-11-21 DIAGNOSIS — H5440 Blindness, one eye, unspecified eye: Secondary | ICD-10-CM | POA: Insufficient documentation

## 2013-11-21 DIAGNOSIS — Z8639 Personal history of other endocrine, nutritional and metabolic disease: Secondary | ICD-10-CM | POA: Insufficient documentation

## 2013-11-21 DIAGNOSIS — I10 Essential (primary) hypertension: Secondary | ICD-10-CM | POA: Insufficient documentation

## 2013-11-21 DIAGNOSIS — Z794 Long term (current) use of insulin: Secondary | ICD-10-CM | POA: Insufficient documentation

## 2013-11-21 DIAGNOSIS — R0602 Shortness of breath: Secondary | ICD-10-CM | POA: Insufficient documentation

## 2013-11-21 DIAGNOSIS — B1920 Unspecified viral hepatitis C without hepatic coma: Secondary | ICD-10-CM | POA: Insufficient documentation

## 2013-11-21 DIAGNOSIS — Z90710 Acquired absence of both cervix and uterus: Secondary | ICD-10-CM | POA: Insufficient documentation

## 2013-11-21 DIAGNOSIS — F17200 Nicotine dependence, unspecified, uncomplicated: Secondary | ICD-10-CM | POA: Insufficient documentation

## 2013-11-21 DIAGNOSIS — A048 Other specified bacterial intestinal infections: Secondary | ICD-10-CM | POA: Insufficient documentation

## 2013-11-21 DIAGNOSIS — K219 Gastro-esophageal reflux disease without esophagitis: Secondary | ICD-10-CM | POA: Insufficient documentation

## 2013-11-21 DIAGNOSIS — E1139 Type 2 diabetes mellitus with other diabetic ophthalmic complication: Secondary | ICD-10-CM | POA: Insufficient documentation

## 2013-11-21 DIAGNOSIS — E11319 Type 2 diabetes mellitus with unspecified diabetic retinopathy without macular edema: Secondary | ICD-10-CM | POA: Insufficient documentation

## 2013-11-21 LAB — COMPREHENSIVE METABOLIC PANEL
ALT: 20 U/L (ref 0–35)
AST: 28 U/L (ref 0–37)
Albumin: 3.3 g/dL — ABNORMAL LOW (ref 3.5–5.2)
Alkaline Phosphatase: 50 U/L (ref 39–117)
BUN: 11 mg/dL (ref 6–23)
CALCIUM: 9.3 mg/dL (ref 8.4–10.5)
CO2: 25 meq/L (ref 19–32)
CREATININE: 0.73 mg/dL (ref 0.50–1.10)
Chloride: 102 mEq/L (ref 96–112)
GLUCOSE: 181 mg/dL — AB (ref 70–99)
Potassium: 4.5 mEq/L (ref 3.7–5.3)
Sodium: 140 mEq/L (ref 137–147)
Total Bilirubin: 0.3 mg/dL (ref 0.3–1.2)
Total Protein: 7.4 g/dL (ref 6.0–8.3)

## 2013-11-21 LAB — URINALYSIS, ROUTINE W REFLEX MICROSCOPIC
BILIRUBIN URINE: NEGATIVE
Glucose, UA: NEGATIVE mg/dL
Ketones, ur: NEGATIVE mg/dL
LEUKOCYTES UA: NEGATIVE
NITRITE: NEGATIVE
Protein, ur: >300 mg/dL — AB
SPECIFIC GRAVITY, URINE: 1.025 (ref 1.005–1.030)
UROBILINOGEN UA: 1 mg/dL (ref 0.0–1.0)
pH: 6.5 (ref 5.0–8.0)

## 2013-11-21 LAB — CBC WITH DIFFERENTIAL/PLATELET
Basophils Absolute: 0.1 10*3/uL (ref 0.0–0.1)
Basophils Relative: 1 % (ref 0–1)
EOS ABS: 0.4 10*3/uL (ref 0.0–0.7)
EOS PCT: 6 % — AB (ref 0–5)
HEMATOCRIT: 38 % (ref 36.0–46.0)
Hemoglobin: 13.2 g/dL (ref 12.0–15.0)
LYMPHS ABS: 3.3 10*3/uL (ref 0.7–4.0)
LYMPHS PCT: 55 % — AB (ref 12–46)
MCH: 29.9 pg (ref 26.0–34.0)
MCHC: 34.7 g/dL (ref 30.0–36.0)
MCV: 86.2 fL (ref 78.0–100.0)
MONO ABS: 0.5 10*3/uL (ref 0.1–1.0)
MONOS PCT: 9 % (ref 3–12)
Neutro Abs: 1.8 10*3/uL (ref 1.7–7.7)
Neutrophils Relative %: 29 % — ABNORMAL LOW (ref 43–77)
Platelets: 293 10*3/uL (ref 150–400)
RBC: 4.41 MIL/uL (ref 3.87–5.11)
RDW: 12.9 % (ref 11.5–15.5)
WBC: 6.1 10*3/uL (ref 4.0–10.5)

## 2013-11-21 LAB — URINE MICROSCOPIC-ADD ON

## 2013-11-21 LAB — LIPASE, BLOOD: Lipase: 87 U/L — ABNORMAL HIGH (ref 11–59)

## 2013-11-21 LAB — GLUCOSE, CAPILLARY: GLUCOSE-CAPILLARY: 157 mg/dL — AB (ref 70–99)

## 2013-11-21 MED ORDER — OMEPRAZOLE 20 MG PO CPDR: 20.0000 mg | DELAYED_RELEASE_CAPSULE | Freq: Two times a day (BID) | ORAL | Status: DC

## 2013-11-21 MED ORDER — HYDROMORPHONE HCL PF 1 MG/ML IJ SOLN
1.0000 mg | Freq: Once | INTRAMUSCULAR | Status: AC
  Administered 2013-11-21: 1 mg via INTRAVENOUS
  Filled 2013-11-21: qty 1

## 2013-11-21 MED ORDER — ONDANSETRON HCL 4 MG/2ML IJ SOLN
4.0000 mg | Freq: Once | INTRAMUSCULAR | Status: AC
  Administered 2013-11-21: 4 mg via INTRAVENOUS
  Filled 2013-11-21: qty 2

## 2013-11-21 MED ORDER — AMOXICILLIN 500 MG PO TABS: 1000.0000 mg | ORAL_TABLET | Freq: Two times a day (BID) | ORAL | Status: DC

## 2013-11-21 MED ORDER — CLARITHROMYCIN 500 MG PO TABS: 500.0000 mg | ORAL_TABLET | Freq: Two times a day (BID) | ORAL | Status: DC

## 2013-11-21 NOTE — ED Notes (Signed)
Per EMS pt from home with c/o abdominal pain and pain all over x 1 month. Cold chills with nausea. No diarrhea/vomiting. Diagnosed with H.Pylori last month, given OTC medications, but aren't helping.

## 2013-11-21 NOTE — ED Notes (Signed)
Pt states she was diagnosed with hepatitis c 2 weeks ago, wanting more information.

## 2013-11-21 NOTE — Discharge Instructions (Signed)
You have been diagnosed with H Pylori previously. You were unable to afford the Prevpac, but today I have written a prescription for all of them medications in the Prevpac separately. Your insurance should cover this. Please take these medications twice daily for 14 days. To help control your neuropathy please gradually increase your gabapentin. You may take one additional dose today. Tomorrow take 2 pills at breakfast and then your normal 2 doses. Friday you may increase to 2 doses at breakfast as well as lunch. Take one pill at dinner. If you get dizzy do not take additional gabapentin. Please follow up with your PCP. Hepatitis C Hepatitis C is a viral infection of the liver. Infection may go undetected for months or years because symptoms may be absent or very mild. Chronic liver disease is the main danger of hepatitis C. This may lead to scarring of the liver (cirrhosis), liver failure, and liver cancer. CAUSES  Hepatitis C is caused by the hepatitis C virus (HCV). Formerly, hepatitis C infections were most commonly transmitted through blood transfusions. In the early 1990s, routine testing of donated blood for hepatitis C and exclusion of blood that tests positive for HCV began. Now, HCV is most commonly transmitted from person to person through injection drug use, sharing needles, or sex with an infected person. A caregiver may also get the infection from exposure to the blood of an infected patient by way of a cut or needle stick.  SYMPTOMS  Acute Phase Many cases of acute HCV infection are mild and cause few problems.Some people may not even realize they are sick.Symptoms in others may last a few weeks to several months and include:  Feeling very tired.  Loss of appetite.  Nausea.  Vomiting.  Abdominal pain.  Dark yellow urine.  Yellow skin and eyes (jaundice).  Itching of the skin. Chronic Phase  Between 50% to 85% of people who get HCV infection become "chronic carriers." They  often have no symptoms, but the virus stays in their body.They may spread the virus to others and can get long-term liver disease.  Many people with chronic HCV infection remain healthy for many years. However, up to 1 in 5 chronically infected people may develop severe liver diseases including scarring of the liver (cirrhosis), liver failure, or liver cancer. DIAGNOSIS  Diagnosis of hepatitis C infection is made by testing blood for the presence of hepatitis C viral particles called RNA. Other tests may also be done to measure the status of current liver function, exclude other liver problems, or assess liver damage. TREATMENT  Treatment with many antiviral drugs is available and recommended for some patients with chronic HCV infection. Drug treatment is generally considered appropriate for patients who:  Are 54 years of age or older.  Have a positive test for HCV particles in the blood.  Have a liver tissue sample (biopsy) that shows chronic hepatitis and significant scarring (fibrosis).  Do not have signs of liver failure.  Have acceptable blood test results that confirm the wellness of other body organs.  Are willing to be treated and conform to treatment requirements.  Have no other circumstances that would prevent treatment from being recommended (contraindications). All people who are offered and choose to receive drug treatment must understand that careful medical follow up for many months and even years is crucial in order to make successful care possible. The goal of drug treatment is to eliminate any evidence of HCV in the blood on a long-term basis. This is called  a "sustained virologic response" or SVR. Achieving a SVR is associated with a decrease in the chance of life-threatening liver problems, need for a liver transplant, liver cancer rates, and liver-related complications. Successful treatment currently requires taking treatment drugs for at least 24 weeks and up to 72 weeks.  An injected drug (interferon) given weekly and an oral antiviral medicine taken daily are usually prescribed. Side effects from these drugs are common and some may be very serious. Your response to treatment must be carefully monitored by both you and your caregiver throughout the entire treatment period. PREVENTION There is no vaccine for hepatitis C. The only way to prevent the disease is to reduce the risk of exposure to the virus.   Avoid sharing drug needles or personal items like toothbrushes, razors, and nail clippers with an infected person.  Healthcare workers need to avoid injuries and wear appropriate protective equipment such as gloves, gowns, and face masks when performing invasive medical or nursing procedures. HOME CARE INSTRUCTIONS  To avoid making your liver disease worse:  Strictly avoid drinking alcohol.  Carefully review all new prescriptions of medicines with your caregiver. Ask your caregiver which drugs you should avoid. The following drugs are toxic to the liver, and your caregiver may tell you to avoid them:  Isoniazid.  Methyldopa.  Acetaminophen.  Anabolic steroids (muscle-building drugs).  Erythromycin.  Oral contraceptives (birth control pills).  Check with your caregiver to make sure medicine you are currently taking will not be harmful.  Periodic blood tests may be required. Follow your caregiver's advice about when you should have blood tests.  Avoid a sexual relationship until advised otherwise by your caregiver.  Avoid activities that could expose other people to your blood. Examples include sharing a toothbrush, nail clippers, razors, and needles.  Bed rest is not necessary, but it may make you feel better. Recovery time is not related to the amount of rest you receive.  This infection is contagious. Follow your caregiver's instructions in order to avoid spread of the infection. SEEK IMMEDIATE MEDICAL CARE IF:  You have increasing fatigue or  weakness.  You have an oral temperature above 102 F (38.9 C), not controlled by medicine.  You develop loss of appetite, nausea, or vomiting.  You develop jaundice.  You develop easy bruising or bleeding.  You develop any severe problems as a result of your treatment. MAKE SURE YOU:   Understand these instructions.  Will watch your condition.  Will get help right away if you are not doing well or get worse. Document Released: 10/08/2000 Document Revised: 01/03/2012 Document Reviewed: 02/10/2011 [facility] Patient Information 2014 [place], [place].  Gastritis, Adult Gastritis is soreness and puffiness (inflammation) of the lining of the stomach. If you do not get help, gastritis can cause bleeding and sores (ulcers) in the stomach. HOME CARE   Only take medicine as told by your doctor.  If you were given antibiotic medicines, take them as told. Finish the medicines even if you start to feel better.  Drink enough fluids to keep your pee (urine) clear or pale yellow.  Avoid foods and drinks that make your problems worse. Foods you may want to avoid include:  Caffeine or alcohol.  Chocolate.  Mint.  Garlic and onions.  Spicy foods.  Citrus fruits, including oranges, lemons, or limes.  Food containing tomatoes, including sauce, chili, salsa, and pizza.  Fried and fatty foods.  Eat small meals throughout the day instead of large meals. GET HELP RIGHT AWAY IF:  You have black or dark red poop (stools).  You throw up (vomit) blood. It may look like coffee grounds.  You cannot keep fluids down.  Your belly (abdominal) pain gets worse.  You have a fever.  You do not feel better after 1 week.  You have any other questions or concerns. MAKE SURE YOU:   Understand these instructions.  Will watch your condition.  Will get help right away if you are not doing well or get worse. Document Released: 03/29/2008 Document Revised: 01/03/2012 Document Reviewed:  11/24/2011 [facility] Patient Information 2014 [employer].

## 2013-11-21 NOTE — ED Provider Notes (Signed)
CSN: [id number]     Arrival date & time 11/21/13  [id number] History   First MD Initiated Contact with Patient 11/21/13 [phone]     Chief Complaint  Patient presents with  . Abdominal Pain   (Consider location/radiation/quality/duration/timing/severity/associated sxs/prior Treatment) HPI Comments: Patient is a 48 year old female with history of hypertension, hyperlipidemia, diabetes, anemia, hepatitis C, H. Pylori who presents today with abdominal pain. She reports the pain feels as though someone is sticking a needle into her. The pain is diffusely over her body. The pain is worse in her abdomen and her lower extremities. The pain in her lower extremities has been going on for years. Her Abdominal pain has been worsening for the past month. She was diagnosed with H. Pylori by her PCP. She was prescribed a Prevpac, but could not afford this medication. She has not taken any medication for the H. Pylori infection. Additionally she reports that she was diagnosed with hepatitis C one month ago. She has not received any treatment for this. She has associated subjective fevers and chills. She has nausea without vomiting. She denies any diarrhea. Her last bowel movement was last night. This is normal for her. She reports her bowel movements are generally "scrappy". She takes MiraLax for constipation.  The history is provided by the patient. No language interpreter was used.    Past Medical History  Diagnosis Date  . Hypertension   . Hyperlipidemia   . Diabetes mellitus   . GERD (gastroesophageal reflux disease)   . Constipation, chronic   . Glaucoma   . Diabetic retinopathy   . Blind right eye   . Anemia   . Dental caries   . High risk sexual behavior   . Vitamin D deficiency   . Microalbuminuria    Past Surgical History  Procedure Laterality Date  . Abdominal hysterectomy  04/30/2009    PARTIAL   No family history on file. History  Substance Use Topics  . Smoking status: Current Every Day  Smoker  . Smokeless tobacco: Not on file  . Alcohol Use: Yes   OB History   Grav Para Term Preterm Abortions TAB SAB Ect Mult Living                 Review of Systems  Constitutional: Positive for fever and chills.  Respiratory: Positive for shortness of breath and wheezing.   Cardiovascular: Negative for chest pain.  Gastrointestinal: Positive for nausea, vomiting, abdominal pain and abdominal distention. Negative for diarrhea and constipation.  All other systems reviewed and are negative.    Allergies  Review of patient's allergies indicates no known allergies.  Home Medications   Current Outpatient Rx  Name  Route  Sig  Dispense  Refill  . albuterol (PROVENTIL HFA;VENTOLIN HFA) 108 (90 BASE) MCG/ACT inhaler   Inhalation   Inhale 2 puffs into the lungs every 4 (four) hours as needed for shortness of breath. For shortness of breath         . atropine 1 % ophthalmic solution   Both Eyes   Place 1 drop into both eyes 2 (two) times daily.         . benazepril (LOTENSIN) 20 MG tablet   Oral   Take 20 mg by mouth every morning.         . clonazePAM (KLONOPIN) 1 MG tablet   Oral   Take 1 mg by mouth 3 (three) times daily as needed for anxiety.         Marland Kitchen  gabapentin (NEURONTIN) 300 MG capsule   Oral   Take 300 mg by mouth 3 (three) times daily.         Marland Kitchen ibuprofen (ADVIL,MOTRIN) 200 MG tablet   Oral   Take 2 tablets (400 mg total) by mouth every 4 (four) hours as needed for headache or moderate pain.   30 tablet   0   . insulin aspart protamine- aspart (NOVOLOG MIX 70/30) (70-30) 100 UNIT/ML injection   Subcutaneous   Inject 55 Units into the skin 2 (two) times daily with a meal.         . lisinopril (PRINIVIL,ZESTRIL) 40 MG tablet   Oral   Take 40 mg by mouth every morning.          . metFORMIN (GLUCOPHAGE) 500 MG tablet   Oral   Take 500 mg by mouth 2 (two) times daily with a meal.         . omeprazole (PRILOSEC) 20 MG capsule   Oral   Take  20 mg by mouth every morning.         . Travoprost, BAK Free, (TRAVATAN) 0.004 % SOLN ophthalmic solution   Both Eyes   Place 1 drop into both eyes at bedtime.         . traZODone (DESYREL) 150 MG tablet   Oral   Take 150 mg by mouth at bedtime as needed for sleep.          BP 148/93  Pulse 83  Resp 16  SpO2 100% Physical Exam  Nursing note and vitals reviewed. Constitutional: She is oriented to person, place, and time. She appears well-developed and well-nourished. No distress.  HENT:  Head: Normocephalic and atraumatic.  Right Ear: External ear normal.  Left Ear: External ear normal.  Nose: Nose normal.  Mouth/Throat: Oropharynx is clear and moist.  Eyes: Scleral icterus is present.  Neck: Normal range of motion.  No nuchal rigidity or meningeal signs  Cardiovascular: Normal rate, regular rhythm and normal heart sounds.   Pulmonary/Chest: Effort normal and breath sounds normal. No stridor. No respiratory distress. She has no wheezes. She has no rales.  Abdominal: Soft. Bowel sounds are normal. She exhibits no distension. There is no tenderness. There is no rigidity and no guarding.  Musculoskeletal: Normal range of motion.  Neurological: She is alert and oriented to person, place, and time. She has normal strength.  Skin: Skin is warm and dry. She is not diaphoretic. No erythema.  Psychiatric: She has a normal mood and affect. Her behavior is normal.    ED Course  Procedures (including critical care time) Labs Review Labs Reviewed  CBC WITH DIFFERENTIAL - Abnormal; Notable for the following:    Neutrophils Relative % 29 (*)    Lymphocytes Relative 55 (*)    Eosinophils Relative 6 (*)    All other components within normal limits  COMPREHENSIVE METABOLIC PANEL - Abnormal; Notable for the following:    Glucose, Bld 181 (*)    Albumin 3.3 (*)    All other components within normal limits  URINALYSIS, ROUTINE W REFLEX MICROSCOPIC - Abnormal; Notable for the  following:    APPearance CLOUDY (*)    Hgb urine dipstick TRACE (*)    Protein, ur >300 (*)    All other components within normal limits  LIPASE, BLOOD - Abnormal; Notable for the following:    Lipase 87 (*)    All other components within normal limits  GLUCOSE, CAPILLARY - Abnormal; Notable for  the following:    Glucose-Capillary 157 (*)    All other components within normal limits  URINE MICROSCOPIC-ADD ON - Abnormal; Notable for the following:    Squamous Epithelial / LPF MANY (*)    Bacteria, UA FEW (*)    All other components within normal limits   Imaging Review No results found.  EKG Interpretation   None       MDM   1. H. pylori infection   2. Hepatitis C   3. Diabetes    Patient is a 48 year old female with history of hepatitis C and expiratory infection. She's been unable to take her Prevpac. I gave her Cipro prescription for omeprazole, amoxicillin, clarithromycin in hopes she will be able to afford that medication. Also discussed increasing her gabapentin and gradually. Her abdomen is soft and nontender. Lab work appears at her baseline. She will followup with her primary care provider. Return instructions given. Vital signs stable for discharge. Dr. Jeneen Rinks evaluated patient and agrees with plan. Patient / Family / Caregiver informed of clinical course, understand medical decision-making process, and agree with plan.    Elwyn Lade, PA-C 11/22/13 [phone]

## 2013-11-23 NOTE — ED Provider Notes (Signed)
Medical screening examination/treatment/procedure(s) were conducted as a shared visit with non-physician practitioner(s) and myself.  I personally evaluated the patient during the encounter.  EKG Interpretation   None       Please see my note above.  Tanna Furry, MD 11/23/13 [phone]

## 2013-11-23 NOTE — ED Provider Notes (Signed)
I saw and examined this patient with Hardie Lora, PA. Her symptoms her abdominal pain over several weeks. She has a positive hepatitis C screen. No abnormalities of transaminases. She was diagnosed with H. pylori be a breath test. Could not afford the Prevpac. She will be given a Prevpac medications in individual prescription form. Her abdomen is benign her studies are reassuring.  Tanna Furry, MD 11/23/13 1114

## 2013-12-19 ENCOUNTER — Encounter: Payer: Self-pay | Admitting: Internal Medicine

## 2013-12-19 ENCOUNTER — Ambulatory Visit (INDEPENDENT_AMBULATORY_CARE_PROVIDER_SITE_OTHER): Payer: Medicare Other | Admitting: Internal Medicine

## 2013-12-19 VITALS — BP 128/72 | HR 70 | Ht 61.5 in | Wt 141.0 lb

## 2013-12-19 DIAGNOSIS — R634 Abnormal weight loss: Secondary | ICD-10-CM

## 2013-12-19 DIAGNOSIS — K5909 Other constipation: Secondary | ICD-10-CM

## 2013-12-19 DIAGNOSIS — IMO0002 Reserved for concepts with insufficient information to code with codable children: Secondary | ICD-10-CM

## 2013-12-19 DIAGNOSIS — K5900 Constipation, unspecified: Secondary | ICD-10-CM

## 2013-12-19 DIAGNOSIS — E1165 Type 2 diabetes mellitus with hyperglycemia: Secondary | ICD-10-CM

## 2013-12-19 DIAGNOSIS — R1013 Epigastric pain: Secondary | ICD-10-CM

## 2013-12-19 DIAGNOSIS — IMO0001 Reserved for inherently not codable concepts without codable children: Secondary | ICD-10-CM

## 2013-12-19 DIAGNOSIS — R1110 Vomiting, unspecified: Secondary | ICD-10-CM

## 2013-12-19 DIAGNOSIS — E1139 Type 2 diabetes mellitus with other diabetic ophthalmic complication: Secondary | ICD-10-CM

## 2013-12-19 MED ORDER — POLYETHYLENE GLYCOL 3350 17 GM/SCOOP PO POWD: 17.0000 g | Freq: Two times a day (BID) | ORAL | Status: DC

## 2013-12-19 MED ORDER — METOCLOPRAMIDE HCL 10 MG PO TABS: 10.0000 mg | ORAL_TABLET | Freq: Three times a day (TID) | ORAL | Status: DC

## 2013-12-19 NOTE — Progress Notes (Signed)
Subjective:  Referred by Dr. Leighton Roach  Patient ID: Tammy Moses, female    DOB: [birth year], 48 y.o.   MRN: [id number]  HPI The patient is here with several complaints today. She has frequent foul-smelling belching, regurgitation and early satiety. She has lost weight and she has chronic constipation. Metaclopramide 5 mg tid has helped upper GI sxs some. She uses daily MiraLAx but requires MOM every 2-3 days to promote defecation. Last Hgb A1C was 13% 1 month ago but she says adding metformin to insulin has lowered BS. No rectal bleeding.   Wt Readings from Last 3 Encounters:  12/19/13 141 lb (63.957 kg)  07/24/12 140 lb (63.504 kg)  07/29/10 171 lb (77.565 kg)    Recent dx hepatitis C and is to see ID about this. Hx heavy EtOH, reducing. No Known Allergies Outpatient Prescriptions Prior to Visit  Medication Sig Dispense Refill  . albuterol (PROVENTIL HFA;VENTOLIN HFA) 108 (90 BASE) MCG/ACT inhaler Inhale 2 puffs into the lungs every 4 (four) hours as needed for shortness of breath. For shortness of breath      . amoxicillin (AMOXIL) 500 MG tablet Take 2 tablets (1,000 mg total) by mouth 2 (two) times daily.  28 tablet  0  . atropine 1 % ophthalmic solution Place 1 drop into both eyes 2 (two) times daily.      . benazepril (LOTENSIN) 20 MG tablet Take 20 mg by mouth every morning.      . clarithromycin (BIAXIN) 500 MG tablet Take 1 tablet (500 mg total) by mouth 2 (two) times daily.  28 tablet  0  . clonazePAM (KLONOPIN) 1 MG tablet Take 1 mg by mouth 3 (three) times daily as needed for anxiety.      . gabapentin (NEURONTIN) 300 MG capsule Take 300 mg by mouth 3 (three) times daily.      Marland Kitchen ibuprofen (ADVIL,MOTRIN) 200 MG tablet Take 2 tablets (400 mg total) by mouth every 4 (four) hours as needed for headache or moderate pain.  30 tablet  0  . insulin aspart protamine- aspart (NOVOLOG MIX 70/30) (70-30) 100 UNIT/ML injection Inject 55 Units into the skin 2 (two) times daily  with a meal.      . lisinopril (PRINIVIL,ZESTRIL) 40 MG tablet Take 40 mg by mouth every morning.       . metFORMIN (GLUCOPHAGE) 500 MG tablet Take 1,000 mg by mouth 2 (two) times daily with a meal.       . omeprazole (PRILOSEC) 20 MG capsule Take 1 capsule (20 mg total) by mouth 2 (two) times daily before a meal.  28 capsule  0  . Travoprost, BAK Free, (TRAVATAN) 0.004 % SOLN ophthalmic solution Place 1 drop into both eyes at bedtime.      . traZODone (DESYREL) 150 MG tablet Take 150 mg by mouth at bedtime as needed for sleep.      . metoCLOPramide (REGLAN) 5 MG tablet Take 5 mg by mouth 3 (three) times daily before meals.      Marland Kitchen omeprazole (PRILOSEC) 20 MG capsule Take 20 mg by mouth every morning.       No facility-administered medications prior to visit.   Past Medical History  Diagnosis Date  . Hypertension   . Hyperlipidemia   . Diabetes mellitus   . GERD (gastroesophageal reflux disease)   . Constipation, chronic   . Glaucoma   . Diabetic retinopathy   . Blind right eye   . Anemia   .  Dental caries   . High risk sexual behavior   . Vitamin D deficiency   . Microalbuminuria   . Anxiety   . H. pylori infection   . Hepatitis C carrier   . Diabetic neuropathy   . Tobacco dependence   . Nonspecific elevation of levels of transaminase or lactic acid dehydrogenase (LDH)   . Insomnia    Past Surgical History  Procedure Laterality Date  . Abdominal hysterectomy  04/30/2009    PARTIAL  . Esophagogastroduodenoscopy    . Colonoscopy     History   Social History  . Marital Status: Married    Spouse Name: N/A    Number of Children: N/A  . Years of Education: N/A   Social History Main Topics  . Smoking status: Current Every Day Smoker  . Smokeless tobacco: None  . Alcohol Use: Yes  . Drug Use: Yes    Special: Marijuana, Cocaine  . Sexual Activity: None    FHx non contributory     Review of Systems Weakness, back pain, legally blind.all other ROS negative.      Objective:   Physical Exam General:  Chronically ill and in no acute distress Eyes:  Anicteric but muddy sclerae ENT:   Mouth and posterior pharynx - mucosa ok - poor dentition   Neck:   supple w/o thyromegaly or mass.  Lungs: Clear to auscultation bilaterally. Heart:  S1S2, no rubs, murmurs, gallops. Abdomen:  soft, non-tender, no hepatosplenomegaly, hernia, or mass and BS+. No splash no bruits Lymph:  no cervical or supraclavicular adenopathy. Extremities:   no edema Skin   no rash. Neuro:  A&O x 3.  Psych:  appropriate mood and  Affect.   Data Reviewed: PCP note 2010 EGD/colonoscopy in chart    Assessment & Plan:  Regurgitation - Plan: Ambulatory referral to Gastroenterology  Abdominal pain, epigastric - Plan: Ambulatory referral to Gastroenterology  Loss of weight - Plan: Ambulatory referral to Gastroenterology  Chronic constipation  Type II diabetes mellitus with ophthalmic manifestations, uncontrolled  1. I think she has gastroparesis from DM and exacerbated by hyperglycemia. Will increase metaclopramide to 10 mg tid/hs. 2. Schedule EGD to look for other problems like ulcer dz, gastritis 3. Increase MiraLax to bid 4.   The risks and benefits as well as alternatives of endoscopic procedure(s) have been discussed and reviewed. All questions answered. The patient agrees to proceed. 5.   Continue to work on DM control and eliminate EtOH  [id number], MD

## 2013-12-19 NOTE — Patient Instructions (Addendum)
You have been scheduled for an endoscopy with propofol. Please follow written instructions given to you at your visit today. If you use inhalers (even only as needed), please bring them with you on the day of your procedure.   Please take Miralax twice a day.  Continue to work on your blood sugars and try to cut down/elimate alcohol consumption.  We have sent the following medications to your pharmacy for you to pick up at your convenience: reglan  I appreciate the opportunity to care for you.

## 2013-12-20 ENCOUNTER — Encounter: Payer: Self-pay | Admitting: Internal Medicine

## 2014-01-14 ENCOUNTER — Other Ambulatory Visit: Payer: Self-pay

## 2014-01-14 MED ORDER — POLYETHYLENE GLYCOL 3350 17 GM/SCOOP PO POWD: 17.0000 g | Freq: Two times a day (BID) | ORAL | Status: DC

## 2014-01-15 ENCOUNTER — Emergency Department (HOSPITAL_COMMUNITY)
Admission: EM | Admit: 2014-01-15 | Discharge: 2014-01-15 | Disposition: A | Discharge status: Home / Self Care | Payer: Medicare Other | Attending: Emergency Medicine | Admitting: Emergency Medicine

## 2014-01-15 ENCOUNTER — Encounter (HOSPITAL_COMMUNITY): Payer: Self-pay | Admitting: Emergency Medicine

## 2014-01-15 DIAGNOSIS — E559 Vitamin D deficiency, unspecified: Secondary | ICD-10-CM | POA: Insufficient documentation

## 2014-01-15 DIAGNOSIS — K219 Gastro-esophageal reflux disease without esophagitis: Secondary | ICD-10-CM

## 2014-01-15 DIAGNOSIS — E119 Type 2 diabetes mellitus without complications: Secondary | ICD-10-CM | POA: Insufficient documentation

## 2014-01-15 DIAGNOSIS — H409 Unspecified glaucoma: Secondary | ICD-10-CM | POA: Insufficient documentation

## 2014-01-15 DIAGNOSIS — R300 Dysuria: Secondary | ICD-10-CM | POA: Insufficient documentation

## 2014-01-15 DIAGNOSIS — F17200 Nicotine dependence, unspecified, uncomplicated: Secondary | ICD-10-CM | POA: Insufficient documentation

## 2014-01-15 DIAGNOSIS — E11319 Type 2 diabetes mellitus with unspecified diabetic retinopathy without macular edema: Secondary | ICD-10-CM | POA: Insufficient documentation

## 2014-01-15 DIAGNOSIS — I10 Essential (primary) hypertension: Secondary | ICD-10-CM | POA: Insufficient documentation

## 2014-01-15 DIAGNOSIS — Z79899 Other long term (current) drug therapy: Secondary | ICD-10-CM | POA: Insufficient documentation

## 2014-01-15 DIAGNOSIS — F411 Generalized anxiety disorder: Secondary | ICD-10-CM | POA: Insufficient documentation

## 2014-01-15 DIAGNOSIS — M25569 Pain in unspecified knee: Secondary | ICD-10-CM | POA: Insufficient documentation

## 2014-01-15 DIAGNOSIS — M25549 Pain in joints of unspecified hand: Secondary | ICD-10-CM | POA: Insufficient documentation

## 2014-01-15 DIAGNOSIS — E1140 Type 2 diabetes mellitus with diabetic neuropathy, unspecified: Secondary | ICD-10-CM

## 2014-01-15 DIAGNOSIS — Z794 Long term (current) use of insulin: Secondary | ICD-10-CM | POA: Insufficient documentation

## 2014-01-15 DIAGNOSIS — G4700 Insomnia, unspecified: Secondary | ICD-10-CM | POA: Insufficient documentation

## 2014-01-15 DIAGNOSIS — G8929 Other chronic pain: Secondary | ICD-10-CM

## 2014-01-15 DIAGNOSIS — E1139 Type 2 diabetes mellitus with other diabetic ophthalmic complication: Secondary | ICD-10-CM | POA: Insufficient documentation

## 2014-01-15 DIAGNOSIS — E1149 Type 2 diabetes mellitus with other diabetic neurological complication: Secondary | ICD-10-CM | POA: Insufficient documentation

## 2014-01-15 DIAGNOSIS — Z862 Personal history of diseases of the blood and blood-forming organs and certain disorders involving the immune mechanism: Secondary | ICD-10-CM | POA: Insufficient documentation

## 2014-01-15 DIAGNOSIS — E1142 Type 2 diabetes mellitus with diabetic polyneuropathy: Secondary | ICD-10-CM | POA: Insufficient documentation

## 2014-01-15 LAB — URINALYSIS, ROUTINE W REFLEX MICROSCOPIC
GLUCOSE, UA: NEGATIVE mg/dL
Hgb urine dipstick: NEGATIVE
KETONES UR: NEGATIVE mg/dL
LEUKOCYTES UA: NEGATIVE
Nitrite: NEGATIVE
PH: 7 (ref 5.0–8.0)
Protein, ur: >300 mg/dL — AB
Specific Gravity, Urine: 1.022 (ref 1.005–1.030)
Urobilinogen, UA: 1 mg/dL (ref 0.0–1.0)

## 2014-01-15 LAB — CBG MONITORING, ED: Glucose-Capillary: 194 mg/dL — ABNORMAL HIGH (ref 70–99)

## 2014-01-15 LAB — URINE MICROSCOPIC-ADD ON

## 2014-01-15 MED ORDER — KETOROLAC TROMETHAMINE 60 MG/2ML IM SOLN
60.0000 mg | Freq: Once | INTRAMUSCULAR | Status: AC
  Administered 2014-01-15: 60 mg via INTRAMUSCULAR
  Filled 2014-01-15: qty 2

## 2014-01-15 MED ORDER — GABAPENTIN 300 MG PO CAPS: 300.0000 mg | ORAL_CAPSULE | Freq: Three times a day (TID) | ORAL | Status: DC

## 2014-01-15 MED ORDER — PANTOPRAZOLE SODIUM 40 MG PO TBEC
40.0000 mg | DELAYED_RELEASE_TABLET | Freq: Once | ORAL | Status: AC
  Administered 2014-01-15: 40 mg via ORAL
  Filled 2014-01-15: qty 1

## 2014-01-15 NOTE — ED Notes (Signed)
Pt's CBG is 194mg /dl. RN notified.

## 2014-01-15 NOTE — ED Provider Notes (Signed)
CSN: [id number]     Arrival date & time 01/15/14  1126 History   First MD Initiated Contact with Patient 01/15/14 1230     Chief Complaint  Patient presents with  . Extremity Pain  . Gastrophageal Reflux  . Dysuria     (Consider location/radiation/quality/duration/timing/severity/associated sxs/prior Treatment) HPI Comments: Patient is a 48 year old female with an extensive past medical history including diabetes, hypertension, GERD, H. pylori and hepatitis C among multiple other medical problems who presents to the emergency department claiming of burning pain in her hands, feet and lower legs beginning last night after running out of her Neurontin. Patient states she took her last Neurontin last night, was unable to get an appointment with her primary care physician for a refill. States she "always has pain in her feet" and has difficulty walking. Also complaining of increased foul-smelling burping, she was recently treated for H. pylori, has a followup appointment with the gastroenterologist next month. Also complaining of increased urinary frequency and dysuria. Denies abdominal pain, nausea, vomiting or diarrhea. No fever or chills. She checks her sugar at home and it has been running between 100-200. States she is compliant with her insulin.  Patient is a 48 y.o. female presenting with extremity pain, GERD, and dysuria. The history is provided by the patient.  Extremity Pain Associated symptoms include arthralgias.  Gastrophageal Reflux Associated symptoms include arthralgias.  Dysuria   Past Medical History  Diagnosis Date  . Hypertension   . Hyperlipidemia   . Diabetes mellitus   . GERD (gastroesophageal reflux disease)   . Constipation, chronic   . Glaucoma   . Diabetic retinopathy   . Blind right eye   . Anemia   . Dental caries   . High risk sexual behavior   . Vitamin D deficiency   . Microalbuminuria   . Anxiety   . H. pylori infection   . Hepatitis C carrier   .  Diabetic neuropathy   . Tobacco dependence   . Nonspecific elevation of levels of transaminase or lactic acid dehydrogenase (LDH)   . Insomnia    Past Surgical History  Procedure Laterality Date  . Abdominal hysterectomy  04/30/2009    PARTIAL  . Esophagogastroduodenoscopy    . Colonoscopy     History reviewed. No pertinent family history. History  Substance Use Topics  . Smoking status: Current Every Day Smoker  . Smokeless tobacco: Not on file  . Alcohol Use: Yes   OB History   Grav Para Term Preterm Abortions TAB SAB Ect Mult Living                 Review of Systems  Gastrointestinal:       Positive for increased belching.  Genitourinary: Positive for dysuria.  Musculoskeletal: Positive for arthralgias.  All other systems reviewed and are negative.      Allergies  Review of patient's allergies indicates no known allergies.  Home Medications   Current Outpatient Rx  Name  Route  Sig  Dispense  Refill  . albuterol (PROVENTIL HFA;VENTOLIN HFA) 108 (90 BASE) MCG/ACT inhaler   Inhalation   Inhale 2 puffs into the lungs every 4 (four) hours as needed for shortness of breath. For shortness of breath         . atropine 1 % ophthalmic solution   Both Eyes   Place 1 drop into both eyes 2 (two) times daily.         . benazepril (LOTENSIN) 20 MG tablet  Oral   Take 20 mg by mouth every morning.         . Cholecalciferol (VITAMIN D3) 1200 UNIT/15ML LIQD   Oral   Take 15 mLs by mouth daily.         . clonazePAM (KLONOPIN) 1 MG tablet   Oral   Take 1 mg by mouth 3 (three) times daily as needed for anxiety.         . gabapentin (NEURONTIN) 300 MG capsule   Oral   Take 300 mg by mouth 3 (three) times daily.         Marland Kitchen ibuprofen (ADVIL,MOTRIN) 200 MG tablet   Oral   Take 2 tablets (400 mg total) by mouth every 4 (four) hours as needed for headache or moderate pain.   30 tablet   0   . insulin aspart protamine- aspart (NOVOLOG MIX 70/30) (70-30) 100  UNIT/ML injection   Subcutaneous   Inject 55 Units into the skin 2 (two) times daily with a meal.         . lisinopril (PRINIVIL,ZESTRIL) 40 MG tablet   Oral   Take 40 mg by mouth every morning.          . metFORMIN (GLUCOPHAGE) 500 MG tablet   Oral   Take 1,000 mg by mouth 2 (two) times daily with a meal.          . metoCLOPramide (REGLAN) 10 MG tablet   Oral   Take 1 tablet (10 mg total) by mouth 4 (four) times daily -  before meals and at bedtime.   120 tablet   2   . omeprazole (PRILOSEC) 20 MG capsule   Oral   Take 1 capsule (20 mg total) by mouth 2 (two) times daily before a meal.   28 capsule   0   . polyethylene glycol (MIRALAX / GLYCOLAX) packet   Oral   Take 17 g by mouth 2 (two) times daily as needed for mild constipation.         . Travoprost, BAK Free, (TRAVATAN) 0.004 % SOLN ophthalmic solution   Both Eyes   Place 1 drop into both eyes at bedtime.         . traZODone (DESYREL) 150 MG tablet   Oral   Take 150-300 mg by mouth at bedtime as needed for sleep.          Marland Kitchen gabapentin (NEURONTIN) 300 MG capsule   Oral   Take 1 capsule (300 mg total) by mouth 3 (three) times daily.   90 capsule   0    BP 157/82  Pulse 94  Temp(Src) 98.7 F (37.1 C) (Oral)  Resp 17  Wt 143 lb (64.864 kg)  SpO2 100% Physical Exam  Nursing note and vitals reviewed. Constitutional: She is oriented to person, place, and time. She appears well-developed and well-nourished. No distress.  HENT:  Head: Normocephalic and atraumatic.  Mouth/Throat: Oropharynx is clear and moist.  Eyes: Conjunctivae are normal.  Neck: Normal range of motion. Neck supple.  Cardiovascular: Normal rate, regular rhythm, normal heart sounds and intact distal pulses.   Pulmonary/Chest: Effort normal and breath sounds normal.  Abdominal: Soft. Bowel sounds are normal. There is no tenderness.  Musculoskeletal: Normal range of motion. She exhibits no edema.  Tenderness to palpation of  bilateral hands, both legs and feet without swelling or erythema.  Neurological: She is alert and oriented to person, place, and time.  Strength upper and lower extremites 5/5 equal bilaterally.  Sensation intact.  Skin: Skin is warm and dry. She is not diaphoretic.  Psychiatric: She has a normal mood and affect. Her behavior is normal.    ED Course  Procedures (including critical care time) Labs Review Labs Reviewed  URINALYSIS, ROUTINE W REFLEX MICROSCOPIC - Abnormal; Notable for the following:    Bilirubin Urine SMALL (*)    Protein, ur >300 (*)    All other components within normal limits  URINE MICROSCOPIC-ADD ON - Abnormal; Notable for the following:    Squamous Epithelial / LPF FEW (*)    All other components within normal limits  CBG MONITORING, ED - Abnormal; Notable for the following:    Glucose-Capillary 194 (*)    All other components within normal limits   Imaging Review No results found.   EKG Interpretation None      MDM   Final diagnoses:  Diabetic neuropathy  GERD (gastroesophageal reflux disease)  Chronic pain   Patient presenting with increased chronic pain from diabetic neuropathy after running out of her Neurontin. She is well appearing and in no apparent distress, afebrile and stable vital signs. Also complaining of increased reflux. She has an appointment with gastroenterology next month. No abdominal pain, nausea, vomiting or fever. Pain treated in the emergency department with Toradol, Protonix by mouth given. Patient takes omeprazole at home, discussed importance of medication compliance. CBG 194. Urine without infection. Patient stable for discharge, will get a prescription for Neurontin, discussed importance of followup care physician for management of chronic conditions. Return precautions given. Patient states understanding of treatment care plan and is agreeable.     Illene Labrador, PA-C 01/15/14 1355

## 2014-01-15 NOTE — Discharge Instructions (Signed)
Take Neurontin daily as prescribed. Followup with your primary care physician regarding your chronic issues. Case omeprazole, also known as Prilosec daily as prescribed for your reflux.  Chronic Pain Chronic pain can be defined as pain that is off and on and lasts for 3 6 months or longer. Many things cause chronic pain, which can make it difficult to make a diagnosis. There are many treatment options available for chronic pain. However, finding a treatment that works well for you may require trying various approaches until the right one is found. Many people benefit from a combination of two or more types of treatment to control their pain. SYMPTOMS  Chronic pain can occur anywhere in the body and can range from mild to very severe. Some types of chronic pain include:  Headache.  Low back pain.  Cancer pain.  Arthritis pain.  Neurogenic pain. This is pain resulting from damage to nerves. People with chronic pain may also have other symptoms such as:  Depression.  Anger.  Insomnia.  Anxiety. DIAGNOSIS  Your health care provider will help diagnose your condition over time. In many cases, the initial focus will be on excluding possible conditions that could be causing the pain. Depending on your symptoms, your health care provider may order tests to diagnose your condition. Some of these tests may include:   Blood tests.   CT scan.   MRI.   X-rays.   Ultrasounds.   Nerve conduction studies.  You may need to see a specialist.  TREATMENT  Finding treatment that works well may take time. You may be referred to a pain specialist. He or she may prescribe medicine or therapies, such as:   Mindful meditation or yoga.  Shots (injections) of numbing or pain-relieving medicines into the spine or area of pain.  Local electrical stimulation.  Acupuncture.   Massage therapy.   Aroma, color, light, or sound therapy.   Biofeedback.   Working with a physical  therapist to keep from getting stiff.   Regular, gentle exercise.   Cognitive or behavioral therapy.   Group support.  Sometimes, surgery may be recommended.  HOME CARE INSTRUCTIONS   Take all medicines as directed by your health care provider.   Lessen stress in your life by relaxing and doing things such as listening to calming music.   Exercise or be active as directed by your health care provider.   Eat a healthy diet and include things such as vegetables, fruits, fish, and lean meats in your diet.   Keep all follow-up appointments with your health care provider.   Attend a support group with others suffering from chronic pain. SEEK MEDICAL CARE IF:   Your pain gets worse.   You develop a new pain that was not there before.   You cannot tolerate medicines given to you by your health care provider.   You have new symptoms since your last visit with your health care provider.  SEEK IMMEDIATE MEDICAL CARE IF:   You feel weak.   You have decreased sensation or numbness.   You lose control of bowel or bladder function.   Your pain suddenly gets much worse.   You develop shaking.  You develop chills.  You develop confusion.  You develop chest pain.  You develop shortness of breath.  MAKE SURE YOU:  Understand these instructions.  Will watch your condition.  Will get help right away if you are not doing well or get worse. Document Released: 07/03/2002 Document Revised: 06/13/2013 Document  Reviewed: 04/06/2013 [employer] Patient Information 2014 [place], [place].  Diabetic Neuropathy Diabetic neuropathy is a nerve disease or nerve damage that is caused by diabetes mellitus. About half of all people with diabetes mellitus have some form of nerve damage. Nerve damage is more common in those who have had diabetes mellitus for many years and who generally have not had good control of their blood sugar (glucose) level. Diabetic neuropathy is a common  complication of diabetes mellitus. There are three more common types of diabetic neuropathy and a fourth type that is less common and less understood:   Peripheral neuropathy This is the most common type of diabetic neuropathy. It causes damage to the nerves of the feet and legs first and then eventually the hands and arms.The damage affects the ability to sense touch.  Autonomic neuropathy This type causes damage to the autonomic nervous system, which controls the following functions:  Heartbeat.  Body temperature.  Blood pressure.  Urination.  Digestion.  Sweating.  Sexual function.  Focal neuropathy Focal neuropathy can be painful and unpredictable and occurs most often in older adults with diabetes mellitus. It involves a specific nerve or one area and often comes on suddenly. It usually does not cause long-term problems.  Radiculoplexus neuropathy Sometimes called lumbosacral radiculoplexus neuropathy, radiculoplexus neuropathy affects the nerves of the thighs, hips, buttocks, or legs. It is more common in people with type 2 diabetes mellitus and in older men. It is characterized by debilitating pain, weakness, and atrophy, usually in the thigh muscles. CAUSES  The cause of peripheral, autonomic, and focal neuropathies is diabetes mellitus that is uncontrolled and high glucose levels. The cause of radiculoplexus neuropathy is unknown. However, it is thought to be caused by inflammation related to uncontrolled glucose levels. SIGNS AND SYMPTOMS  Peripheral Neuropathy Peripheral neuropathy develops slowly over time. When the nerves of the feet and legs no longer work there may be:   Burning, stabbing, or aching pain in the legs or feet.  Inability to feel pressure or pain in your feet. This can lead to:  Thick calluses over pressure areas.  Pressure sores.  Ulcers.  Foot deformities.  Reduced ability to feel temperature changes.  Muscle weakness. Autonomic  Neuropathy The symptoms of autonomic neuropathy vary depending on which nerves are affected. Symptoms may include:  Problems with digestion, such as:  Feeling sick to your stomach (nausea).  Vomiting.  Bloating.  Constipation.  Diarrhea.  Abdominal pain.  Difficulty with urination. This occurs if you lose your ability to sense when your bladder is full. Problems include:  Urine leakage (incontinence).  Inability to empty your bladder completely (retention).  Rapid or irregular heartbeat (palpitations).  Blood pressure drops when you stand up (orthostatic hypotension). When you stand up you may feel:  Dizzy.  Weak.  Faint.  In men, inability to attain and maintain an erection.  In women, vaginal dryness and problems with decreased sexual desire and arousal.  Problems with body temperature regulation.  Increased or decreased sweating. Focal Neuropathy  Abnormal eye movements or abnormal alignment of both eyes.  Weakness in the wrist.  Foot drop. This results in an inability to lift the foot properly and abnormal walking or foot movement.  Paralysis on one side of your face (Bell palsy).  Chest or abdominal pain. Radiculoplexus Neuropathy  Sudden, severe pain in your hip, thigh, or buttocks.  Weakness and wasting of thigh muscles.  Difficulty rising from a seated position.  Abdominal swelling.  Unexplained weight loss (usually  more than 10 lb [4.5 kg]). DIAGNOSIS  Peripheral Neuropathy Your senses may be tested. Sensory function testing can be done with:  A light touch using a monofilament.  A vibration with tuning fork.  A sharp sensation with a pin prick. Other tests that can help diagnose neuropathy are:  Nerve conduction velocity. This test checks the transmission of an electrical current through a nerve.  Electromyography. This shows how muscles respond to electrical signals transmitted by nearby nerves.  Quantitative sensory testing.  This is used to assess how your nerves respond to vibrations and changes in temperature. Autonomic Neuropathy Diagnosis is often based on reported symptoms. Tell your health care provider if you experience:   Dizziness.   Constipation.   Diarrhea.   Inappropriate urination or inability to urinate.   Inability to get or maintain an erection.  Tests that may be done include:   Electrocardiography or Holter monitor. These are tests that can help show problems with the heart rate or heart rhythm.   An X-ray exam may be done. Focal Neuropathy Diagnosis is made based on your symptoms and what your health care provider finds during your exam. Other tests may be done. They may include:  Nerve conduction velocities. This checks the transmission of electrical current through a nerve.  Electromyography. This shows how muscles respond to electrical signals transmitted by nearby nerves.  Quantitative sensory testing. This test is used to assess how your nerves respond to vibration and changes in temperature. Radiculoplexus Neuropathy  Often the first thing is to eliminate any other issue or problems that might be the cause, as there is no stick test for diagnosis.  X-ray exam of your spine and lumbar region.  Spinal tap to rule out cancer.  MRI to rule out other lesions. TREATMENT  Once nerve damage occurs, it cannot be reversed. The goal of treatment is to keep the disease or nerve damage from getting worse and affecting more nerve fibers. Controlling your blood glucose level is the key. Most people with radiculoplexus neuropathy see at least a partial improvement over time. You will need to keep your blood glucose and HbA1c levels in the target range determined by your health care provider. Things that help control blood glucose levels include:   Blood glucose monitoring.   Meal planning.   Physical activity.   Diabetes medicine.  Over time, maintaining lower blood glucose  levels helps lessen symptoms. Sometimes, prescription pain medicine is needed. HOME CARE INSTRUCTIONS:  Do not smoke.  Keep your blood glucose level in the range that you and your health care provider have determined acceptable for you.  Keep your blood pressure level in the range that you and your health care provider have determined acceptable for you.  Eat a well-balanced diet.  Be active every day.  Check your feet every day. SEEK MEDICAL CARE IF:   You have burning, stabbing, or aching pain in the legs or feet.  You are unable to feel pressure or pain in your feet.  You develop problems with digestion such as:  Nausea.  Vomiting.  Bloating.  Constipation.  Diarrhea.  Abdominal pain.  You have difficulty with urination, such as:  Incontinence.  Retention.  You have palpitations.  You develop orthostatic hypotension. When you stand up you may feel:  Dizzy.  Weak.  Faint.  You cannot attain and maintain an erection (in men).  You have vaginal dryness and problems with decreased sexual desire and arousal (in women).  You have severe  pain in your thighs, legs, or buttocks.  You have unexplained weight loss. Document Released: 12/20/2001 Document Revised: 08/01/2013 Document Reviewed: 03/22/2013 [facility] Patient Information 2014 [place].  Gastroesophageal Reflux Disease, Adult Gastroesophageal reflux disease (GERD) happens when acid from your stomach flows up into the esophagus. When acid comes in contact with the esophagus, the acid causes soreness (inflammation) in the esophagus. Over time, GERD may create small holes (ulcers) in the lining of the esophagus. CAUSES   Increased body weight. This puts pressure on the stomach, making acid rise from the stomach into the esophagus.  Smoking. This increases acid production in the stomach.  Drinking alcohol. This causes decreased pressure in the lower esophageal sphincter (valve or ring of muscle  between the esophagus and stomach), allowing acid from the stomach into the esophagus.  Late evening meals and a full stomach. This increases pressure and acid production in the stomach.  A malformed lower esophageal sphincter. Sometimes, no cause is found. SYMPTOMS   Burning pain in the lower part of the mid-chest behind the breastbone and in the mid-stomach area. This may occur twice a week or more often.  Trouble swallowing.  Sore throat.  Dry cough.  Asthma-like symptoms including chest tightness, shortness of breath, or wheezing. DIAGNOSIS  Your caregiver may be able to diagnose GERD based on your symptoms. In some cases, X-rays and other tests may be done to check for complications or to check the condition of your stomach and esophagus. TREATMENT  Your caregiver may recommend over-the-counter or prescription medicines to help decrease acid production. Ask your caregiver before starting or adding any new medicines.  HOME CARE INSTRUCTIONS   Change the factors that you can control. Ask your caregiver for guidance concerning weight loss, quitting smoking, and alcohol consumption.  Avoid foods and drinks that make your symptoms worse, such as:  Caffeine or alcoholic drinks.  Chocolate.  Peppermint or mint flavorings.  Garlic and onions.  Spicy foods.  Citrus fruits, such as oranges, lemons, or limes.  Tomato-based foods such as sauce, chili, salsa, and pizza.  Fried and fatty foods.  Avoid lying down for the 3 hours prior to your bedtime or prior to taking a nap.  Eat small, frequent meals instead of large meals.  Wear loose-fitting clothing. Do not wear anything tight around your waist that causes pressure on your stomach.  Raise the head of your bed 6 to 8 inches with wood blocks to help you sleep. Extra pillows will not help.  Only take over-the-counter or prescription medicines for pain, discomfort, or fever as directed by your caregiver.  Do not take  aspirin, ibuprofen, or other nonsteroidal anti-inflammatory drugs (NSAIDs). SEEK IMMEDIATE MEDICAL CARE IF:   You have pain in your arms, neck, jaw, teeth, or back.  Your pain increases or changes in intensity or duration.  You develop nausea, vomiting, or sweating (diaphoresis).  You develop shortness of breath, or you faint.  Your vomit is green, yellow, black, or looks like coffee grounds or blood.  Your stool is red, bloody, or black. These symptoms could be signs of other problems, such as heart disease, gastric bleeding, or esophageal bleeding. MAKE SURE YOU:   Understand these instructions.  Will watch your condition.  Will get help right away if you are not doing well or get worse. Document Released: 07/21/2005 Document Revised: 01/03/2012 Document Reviewed: 04/30/2011 [facility] Patient Information 2014 [place], [place].

## 2014-01-15 NOTE — ED Notes (Signed)
Pt reports burning pain in her feet increasing last night. States she's tried taking her neurontin isnt helping. States her feet hurt all the time and is having difficulty walking. Pt also reports recent illness with h, pylori and has been having a lot of reflux and burping. Pt reports some dysuria as well.

## 2014-01-15 NOTE — ED Provider Notes (Signed)
Medical screening examination/treatment/procedure(s) were performed by non-physician practitioner and as supervising physician I was immediately available for consultation/collaboration.   EKG Interpretation None        Mervin Kung, MD 01/15/14 [phone]

## 2014-01-30 ENCOUNTER — Other Ambulatory Visit: Payer: Self-pay | Admitting: *Deleted

## 2014-01-30 ENCOUNTER — Other Ambulatory Visit: Payer: Medicaid Other

## 2014-01-30 DIAGNOSIS — B182 Chronic viral hepatitis C: Secondary | ICD-10-CM

## 2014-01-30 LAB — CBC WITH DIFFERENTIAL/PLATELET
BASOS PCT: 1 % (ref 0–1)
Basophils Absolute: 0.1 10*3/uL (ref 0.0–0.1)
EOS ABS: 0.4 10*3/uL (ref 0.0–0.7)
Eosinophils Relative: 5 % (ref 0–5)
HCT: 38 % (ref 36.0–46.0)
Hemoglobin: 12.7 g/dL (ref 12.0–15.0)
Lymphocytes Relative: 63 % — ABNORMAL HIGH (ref 12–46)
Lymphs Abs: 4.9 10*3/uL — ABNORMAL HIGH (ref 0.7–4.0)
MCH: 28.5 pg (ref 26.0–34.0)
MCHC: 33.4 g/dL (ref 30.0–36.0)
MCV: 85.2 fL (ref 78.0–100.0)
Monocytes Absolute: 0.6 10*3/uL (ref 0.1–1.0)
Monocytes Relative: 8 % (ref 3–12)
NEUTROS PCT: 23 % — AB (ref 43–77)
Neutro Abs: 1.8 10*3/uL (ref 1.7–7.7)
Platelets: 308 10*3/uL (ref 150–400)
RBC: 4.46 MIL/uL (ref 3.87–5.11)
RDW: 15 % (ref 11.5–15.5)
WBC: 7.7 10*3/uL (ref 4.0–10.5)

## 2014-01-30 LAB — COMPREHENSIVE METABOLIC PANEL
ALK PHOS: 50 U/L (ref 39–117)
ALT: 21 U/L (ref 0–35)
AST: 22 U/L (ref 0–37)
Albumin: 3.8 g/dL (ref 3.5–5.2)
BILIRUBIN TOTAL: 0.4 mg/dL (ref 0.2–1.2)
BUN: 22 mg/dL (ref 6–23)
CO2: 26 mEq/L (ref 19–32)
Calcium: 9.5 mg/dL (ref 8.4–10.5)
Chloride: 96 mEq/L (ref 96–112)
Creat: 1.24 mg/dL — ABNORMAL HIGH (ref 0.50–1.10)
Glucose, Bld: 186 mg/dL — ABNORMAL HIGH (ref 70–99)
Potassium: 4.2 mEq/L (ref 3.5–5.3)
SODIUM: 135 meq/L (ref 135–145)
TOTAL PROTEIN: 7.4 g/dL (ref 6.0–8.3)

## 2014-01-30 LAB — HEPATITIS B SURFACE ANTIGEN: Hepatitis B Surface Ag: NEGATIVE

## 2014-01-30 LAB — HEPATITIS B SURFACE ANTIBODY,QUALITATIVE: HEP B S AB: NEGATIVE

## 2014-01-30 LAB — IRON: Iron: 85 ug/dL (ref 42–145)

## 2014-01-30 LAB — PROTIME-INR
INR: 0.88 (ref <1.50)
Prothrombin Time: 11.9 seconds (ref 11.6–15.2)

## 2014-01-30 LAB — HEPATITIS A ANTIBODY, TOTAL: Hep A Total Ab: NONREACTIVE

## 2014-01-30 LAB — HIV ANTIBODY (ROUTINE TESTING W REFLEX): HIV 1&2 Ab, 4th Generation: NONREACTIVE

## 2014-01-30 LAB — HEPATITIS B CORE ANTIBODY, TOTAL: HEP B C TOTAL AB: REACTIVE — AB

## 2014-01-31 LAB — ANA: Anti Nuclear Antibody(ANA): NEGATIVE

## 2014-02-01 LAB — HEPATITIS C RNA QUANTITATIVE
HCV QUANT LOG: 5.79 {Log} — AB (ref <1.18)
HCV Quantitative: 611532 IU/mL — ABNORMAL HIGH (ref <15)

## 2014-02-04 LAB — HEPATITIS C GENOTYPE

## 2014-02-11 ENCOUNTER — Ambulatory Visit: Payer: Medicare Other | Attending: Internal Medicine | Admitting: Physical Therapy

## 2014-02-11 DIAGNOSIS — IMO0001 Reserved for inherently not codable concepts without codable children: Secondary | ICD-10-CM | POA: Diagnosis present

## 2014-02-11 DIAGNOSIS — M545 Low back pain, unspecified: Secondary | ICD-10-CM | POA: Diagnosis not present

## 2014-02-14 ENCOUNTER — Telehealth: Payer: Self-pay | Admitting: Internal Medicine

## 2014-02-14 ENCOUNTER — Encounter: Payer: Self-pay | Admitting: Internal Medicine

## 2014-02-14 ENCOUNTER — Ambulatory Visit (AMBULATORY_SURGERY_CENTER): Payer: Medicare Other | Admitting: Internal Medicine

## 2014-02-14 VITALS — BP 156/92 | HR 94 | Resp 13 | Ht 62.0 in | Wt 135.0 lb

## 2014-02-14 DIAGNOSIS — K319 Disease of stomach and duodenum, unspecified: Secondary | ICD-10-CM

## 2014-02-14 DIAGNOSIS — R1013 Epigastric pain: Secondary | ICD-10-CM

## 2014-02-14 DIAGNOSIS — R634 Abnormal weight loss: Secondary | ICD-10-CM

## 2014-02-14 DIAGNOSIS — K2960 Other gastritis without bleeding: Secondary | ICD-10-CM

## 2014-02-14 LAB — GLUCOSE, CAPILLARY
GLUCOSE-CAPILLARY: 90 mg/dL (ref 70–99)
Glucose-Capillary: 103 mg/dL — ABNORMAL HIGH (ref 70–99)

## 2014-02-14 MED ORDER — OMEPRAZOLE 40 MG PO CPDR: 40.0000 mg | DELAYED_RELEASE_CAPSULE | Freq: Two times a day (BID) | ORAL | Status: DC

## 2014-02-14 NOTE — Op Note (Signed)
[place]  [address]. [place], [id number]   ENDOSCOPY PROCEDURE REPORT  PATIENT: Courtni, Quinby  MR#: [id number] BIRTHDATE: 1966-10-18 , 47  yrs. old GENDER: Female ENDOSCOPIST: Gatha Mayer, MD, [facility] PROCEDURE DATE:  02/14/2014 PROCEDURE:  EGD w/ biopsy ASA CLASS:     Class III INDICATIONS:  Weight loss.   Epigastric pain. MEDICATIONS: propofol (Diprivan) 100mg  IV, MAC sedation, administered by CRNA, and These medications were titrated to patient response per physician's verbal order TOPICAL ANESTHETIC: Cetacaine Spray  DESCRIPTION OF PROCEDURE: After the risks benefits and alternatives of the procedure were thoroughly explained, informed consent was obtained.  The [id number] endoscope was introduced through the mouth and advanced to the second portion of the duodenum. Without limitations.  The instrument was slowly withdrawn as the mucosa was fully examined.  STOMACH: Abnormal mucosa was found in the gastric body and prepyloric region of the stomach.  The mucosa had erosions. Multiple biopsies were performed using cold forceps.  Sample sent for histology.   Food residue was found in the gastric fundus.  The remainder of the upper endoscopy exam was otherwise normal. Retroflexed views revealed as above.     The scope was then withdrawn from the patient and the procedure completed.  COMPLICATIONS: There were no complications. ENDOSCOPIC IMPRESSION: 1.   Abnormal mucosa was found in the gastric body and prepyloric region of the stomach; The mucosa had erosions; multiple biopsies 2.   Food residue in the gastric fundus 3.   The remainder of the upper endoscopy exam was otherwise normal  RECOMMENDATIONS: 1.  Await pathology results 2.  Continue PPI - increase omeprazole to 40 mg bid, continue metaclopramide 3.   Continue to get blood sugars better controlled   eSigned:  Gatha Mayer, MD, [facility] 02/14/2014 3:05 PM   [id number],  Iona Beard MD and The Patient

## 2014-02-14 NOTE — Telephone Encounter (Signed)
Spoke with patient. She states her BP is 159/99, wants to know if she may take her BP meds today. Instructed patient to take both her BP meds as she normally would. Notified her not to take metformin/insulin she understands. Patient instructed to keep check on BP and call back if needed. She understands.

## 2014-02-14 NOTE — Patient Instructions (Addendum)
There were several erosions (tiny ulcers) in your stomach. I took biopsies to see what are causing this and will let you know.  In the meantime lets increase acid blocking medicine (omeprazole) to 40 mg twice a day.  I appreciate the opportunity to care for you. Gatha Mayer, MD, FACG  YOU HAD AN ENDOSCOPIC PROCEDURE TODAY AT [facility]: Refer to the procedure report that was given to you for any specific questions about what was found during the examination.  If the procedure report does not answer your questions, please call your gastroenterologist to clarify.  If you requested that your care partner not be given the details of your procedure findings, then the procedure report has been included in a sealed envelope for you to review at your convenience later.  YOU SHOULD EXPECT: Some feelings of bloating in the abdomen. Passage of more gas than usual.  Walking can help get rid of the air that was put into your GI tract during the procedure and reduce the bloating. If you had a lower endoscopy (such as a colonoscopy or flexible sigmoidoscopy) you may notice spotting of blood in your stool or on the toilet paper. If you underwent a bowel prep for your procedure, then you may not have a normal bowel movement for a few days.  DIET: Your first meal following the procedure should be a light meal and then it is ok to progress to your normal diet.  A half-sandwich or bowl of soup is an example of a good first meal.  Heavy or fried foods are harder to digest and may make you feel nauseous or bloated.  Likewise meals heavy in dairy and vegetables can cause extra gas to form and this can also increase the bloating.  Drink plenty of fluids but you should avoid alcoholic beverages for 24 hours.  ACTIVITY: Your care partner should take you home directly after the procedure.  You should plan to take it easy, moving slowly for the rest of the day.  You can resume normal activity the day after  the procedure however you should NOT DRIVE or use heavy machinery for 24 hours (because of the sedation medicines used during the test).    SYMPTOMS TO REPORT IMMEDIATELY: A gastroenterologist can be reached at any hour.  During normal business hours, 8:30 AM to 5:00 PM Monday through Friday, call [phone].  After hours and on weekends, please call the GI answering service at [phone] who will take a message and have the physician on call contact you.   Following upper endoscopy (EGD)  Vomiting of blood or coffee ground material  New chest pain or pain under the shoulder blades  Painful or persistently difficult swallowing  New shortness of breath  Fever of 100F or higher  Black, tarry-looking stools  FOLLOW UP: If any biopsies were taken you will be contacted by phone or by letter within the next 1-3 weeks.  Call your gastroenterologist if you have not heard about the biopsies in 3 weeks.  Our staff will call the home number listed on your records the next business day following your procedure to check on you and address any questions or concerns that you may have at that time regarding the information given to you following your procedure. This is a courtesy call and so if there is no answer at the home number and we have not heard from you through the emergency physician on call, we will assume that you  have returned to your regular daily activities without incident.  SIGNATURES/CONFIDENTIALITY: You and/or your care partner have signed paperwork which will be entered into your electronic medical record.  These signatures attest to the fact that that the information above on your After Visit Summary has been reviewed and is understood.  Full responsibility of the confidentiality of this discharge information lies with you and/or your care-partner.

## 2014-02-15 ENCOUNTER — Telehealth: Payer: Self-pay | Admitting: *Deleted

## 2014-02-15 NOTE — Telephone Encounter (Signed)
  Follow up Call-  No flowsheet data found.   Patient questions:  Do you have a fever, pain , or abdominal swelling? no Pain Score  0 *  Have you tolerated food without any problems? yes  Have you been able to return to your normal activities? yes  Do you have any questions about your discharge instructions: Diet   no Medications  no Follow up visit  no  Do you have questions or concerns about your Care? no  Actions: * If pain score is 4 or above: No action needed, pain <4.

## 2014-02-20 ENCOUNTER — Encounter: Payer: Self-pay | Admitting: Internal Medicine

## 2014-03-04 ENCOUNTER — Ambulatory Visit (INDEPENDENT_AMBULATORY_CARE_PROVIDER_SITE_OTHER): Payer: Medicare Other | Admitting: Internal Medicine

## 2014-03-04 ENCOUNTER — Encounter: Payer: Self-pay | Admitting: Internal Medicine

## 2014-03-04 VITALS — BP 127/83 | HR 105 | Temp 98.2°F | Ht 62.0 in | Wt 125.0 lb

## 2014-03-04 DIAGNOSIS — B1920 Unspecified viral hepatitis C without hepatic coma: Secondary | ICD-10-CM | POA: Insufficient documentation

## 2014-03-04 DIAGNOSIS — Z8619 Personal history of other infectious and parasitic diseases: Secondary | ICD-10-CM | POA: Insufficient documentation

## 2014-03-04 DIAGNOSIS — Z23 Encounter for immunization: Secondary | ICD-10-CM

## 2014-03-04 NOTE — Patient Instructions (Signed)
Date 03/04/2014  Dear Mrs. Arnesen, As discussed in the [facility], your hepatitis C therapy will include the following medications:          Harvoni 90mg /400mg  tablet:           Take 1 tablet by mouth once daily   Please note that ALL MEDICATIONS WILL START ON THE SAME DATE for a total of _ weeks. ---------------------------------------------------------------- Your HCV Treatment Start Date: TBA   Your HCV genotype:  1b    Liver Fibrosis:    TBA ---------------------------------------------------------------- YOUR PHARMACY CONTACT:   [place] [facility] [place] Phone: [phone] Hours: Monday to Friday 7:30 am to 6:00 pm   Please always contact your pharmacy at least 3-4 business days before you run out of medications to ensure your next month's medication is ready or 1 week prior to running out if you receive it by mail.  Remember, each prescription is for 28 days. ---------------------------------------------------------------- GENERAL NOTES REGARDING YOUR HEPATITIS C MEDICATIONS:  SOFOSBUVIR/LEDIPASVIR (HARVONI): - Harvoni tablet is taken daily with OR without food. - The tablets are orange. - The tablets should be stored at room temperature.  - Acid reducing agents such as H2 blockers (ie. Pepcid (famotidine), Zantac (ranitidine), Tagamet (cimetidine), Axid (nizatidine) and proton pump inhibitors (ie. Prilosec (omeprazole), Protonix (pantoprazole), Nexium (esomeprazole), or Aciphex (rabeprazole)) can decrease effectiveness of Harvoni. Do not take until you have discussed with a health care provider.    -Antacids that contain magnesium and/or aluminum hydroxide (ie. Milk of Magensia, Rolaids, Gaviscon, Maalox, Mylanta, an dArthritis Pain Formula)can reduce absorption of Harvoni, so take them at least 4 hours before or after Harvoni.  -Calcium carbonate (calcium supplements or antacids such as Tums, Caltrate,  Os-Cal)needs to be taken at least 4 hours hours before or after Harvoni.  -St. John's wort or any products that contain St. John's wort like some herbal supplements  Please inform the office prior to starting any of these medications.  - The common side effects with Harvoni:      1. Fatigue      2. Headache      3. Nausea      4. Diarrhea      5. Insomnia   Support Path is a suite of resources designed to help patients start with HARVONI and move toward treatment completion [place] helps patients access therapy and get off to an efficient start  Benefits investigation and prior authorization support Co-pay and other financial assistance A specialty pharmacy finder CO-PAY COUPON The [place] co-pay coupon may help eligible patients lower their out-of-pocket costs. With a co-pay coupon, most eligible patients may pay no more than $5 per co-pay (restrictions apply) www.harvoni.com call (984) 046-0174 Not valid for patients enrolled in government healthcare prescription drug programs, such as Medicare Part D and Medicaid. Patients in the coverage gap known as the "donut hole" also are not eligible The HARVONI co-pay coupon program will cover the out-of-pocket costs for HARVONI prescriptions up to a maximum of 25% of the catalog price of a 12-week regimen of HARVONI  Please note that this only lists the most common side effects and is NOT a comprehensive list of the potential side effects of these medications. For more information, please review the drug information sheets that come with your medication package from the pharmacy.  ---------------------------------------------------------------- GENERAL HELPFUL HINTS ON HCV THERAPY: 1. No alcohol. 2. Protect against sun-sensitivity/sunburns (wear sunglasses, hat, long sleeves, pants  and sunscreen). 3. Stay well-hydrated/well-moisturized. 4. Notify the [facility] of any changes in your other over-the-counter/herbal or  prescription medications. 5. If you miss a dose of your medication, take the missed dose as soon as you remember. Return to your regular time/dose schedule the next day.  6.  Do not stop taking your medications without first talking with your healthcare provider. 7.  You may take Tylenol (acetaminophen), as long as the dose is less than 2000 mg (OR no more than 4 tablets of the Tylenol Extra Strengths 500mg  tablet) in 24 hours. 8.  You will need to obtain routine labs and/or office visits at [facility] at weeks 2, 4, 8,  and 12 as well as 12 and 24 weeks after completion of treatment.  If you are not compliant with labs and office visits, we may discontinue HCV therapy.  Thayer Headings, [place] [address] [place], [place]  [id number] [phone]
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
## 2014-03-07 ENCOUNTER — Encounter: Payer: Self-pay | Admitting: Neurology

## 2014-03-07 ENCOUNTER — Ambulatory Visit (INDEPENDENT_AMBULATORY_CARE_PROVIDER_SITE_OTHER): Payer: Medicare Other | Admitting: Neurology

## 2014-03-07 VITALS — BP 117/82 | HR 82 | Ht 62.0 in | Wt 125.0 lb

## 2014-03-07 DIAGNOSIS — M545 Low back pain, unspecified: Secondary | ICD-10-CM

## 2014-03-07 DIAGNOSIS — R269 Unspecified abnormalities of gait and mobility: Secondary | ICD-10-CM

## 2014-03-07 MED ORDER — GABAPENTIN (ONCE-DAILY) 600 MG PO TABS: 600.0000 mg | ORAL_TABLET | Freq: Three times a day (TID) | ORAL | Status: DC
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
## 2014-03-14 ENCOUNTER — Ambulatory Visit
Admission: RE | Admit: 2014-03-14 | Discharge: 2014-03-14 | Disposition: A | Discharge status: Home / Self Care | Payer: Medicaid Other | Source: Ambulatory Visit | Attending: Neurology | Admitting: Neurology

## 2014-03-14 ENCOUNTER — Ambulatory Visit
Admission: RE | Admit: 2014-03-14 | Discharge: 2014-03-14 | Disposition: A | Discharge status: Home / Self Care | Payer: Medicare Other | Source: Ambulatory Visit | Attending: Neurology | Admitting: Neurology

## 2014-03-14 DIAGNOSIS — M545 Low back pain, unspecified: Secondary | ICD-10-CM

## 2014-03-14 DIAGNOSIS — R269 Unspecified abnormalities of gait and mobility: Secondary | ICD-10-CM

## 2014-03-22 ENCOUNTER — Telehealth: Payer: Self-pay | Admitting: Licensed Clinical Social Worker

## 2014-03-22 ENCOUNTER — Encounter: Payer: Self-pay | Admitting: Neurology

## 2014-03-22 NOTE — Telephone Encounter (Signed)
Patient was calling to see if her U/S has been scheduled? She was here on 5/11 just wondering if you completed this?

## 2014-03-25 ENCOUNTER — Telehealth: Payer: Self-pay | Admitting: *Deleted

## 2014-03-25 NOTE — Telephone Encounter (Signed)
U/S appt scheduled for Wed., June 3 @ 0930, NPO after MN.  Left pt the number for Radiology Scheduling, [phone] for questions or rescheduling.

## 2014-03-27 ENCOUNTER — Ambulatory Visit (HOSPITAL_COMMUNITY): Admission: RE | Admit: 2014-03-27 | Payer: Medicare Other | Source: Ambulatory Visit

## 2014-03-27 ENCOUNTER — Encounter: Payer: Medicare Other | Admitting: Neurology

## 2014-03-27 ENCOUNTER — Ambulatory Visit (HOSPITAL_COMMUNITY)
Admission: RE | Admit: 2014-03-27 | Discharge: 2014-03-27 | Disposition: A | Discharge status: Home / Self Care | Payer: Medicare Other | Source: Ambulatory Visit | Attending: Internal Medicine | Admitting: Internal Medicine

## 2014-03-27 DIAGNOSIS — B1920 Unspecified viral hepatitis C without hepatic coma: Secondary | ICD-10-CM | POA: Insufficient documentation

## 2014-03-27 NOTE — Progress Notes (Signed)
Quick Note:  Left message with MRI lumbar spine results, per Dr. Krista Blue. Told to call with any questions. ______

## 2014-04-01 ENCOUNTER — Ambulatory Visit: Payer: Medicare Other | Admitting: Internal Medicine

## 2014-04-02 ENCOUNTER — Encounter: Payer: Self-pay | Admitting: Internal Medicine

## 2014-04-02 ENCOUNTER — Ambulatory Visit (INDEPENDENT_AMBULATORY_CARE_PROVIDER_SITE_OTHER): Payer: Medicare Other | Admitting: Internal Medicine

## 2014-04-02 VITALS — BP 135/89 | HR 89 | Temp 98.5°F | Ht 62.0 in | Wt 121.0 lb

## 2014-04-02 DIAGNOSIS — B1920 Unspecified viral hepatitis C without hepatic coma: Secondary | ICD-10-CM

## 2014-04-02 NOTE — Assessment & Plan Note (Addendum)
Doing well.  Discussed results including F0 scan.  Pateint pleased and understands that insurance at this time is not covering patients with no fibrosis and we certainly do have time to wait.   Return in 1 year and will rescan, retry for Harvoni.    Will need hep A #2 in November 2015

## 2014-04-02 NOTE — Progress Notes (Signed)
   Subjective:    Patient ID: Tammy Moses, female    DOB: April 13, 1966, 48 y.o.   MRN: [id number]  HPI Here for follow up of HCV.  Has genotype 1b, viral load of [id number].  Ultrasound with elastography shows F0.  No new issues.  Started hepatitis A vaccine series.  Hep B immune.     Review of Systems  Constitutional: Negative for fatigue.  Neurological: Negative for dizziness and light-headedness.       Objective:   Physical Exam  Constitutional: She appears well-developed and well-nourished. No distress.  Eyes: No scleral icterus.  Cardiovascular: Normal rate, regular rhythm and normal heart sounds.   No murmur heard. Skin: Skin is warm and dry. No rash noted.          Assessment & Plan:

## 2014-04-03 ENCOUNTER — Encounter: Payer: Medicare Other | Admitting: Neurology

## 2014-04-29 ENCOUNTER — Telehealth: Payer: Self-pay

## 2014-04-29 ENCOUNTER — Encounter: Payer: Medicare Other | Admitting: Neurology

## 2014-04-29 NOTE — Telephone Encounter (Signed)
Patient requesting refill on generic reglan 10mg  , please advise Sir, thank you.

## 2014-05-01 ENCOUNTER — Other Ambulatory Visit: Payer: Self-pay | Admitting: Internal Medicine

## 2014-05-01 MED ORDER — METOCLOPRAMIDE HCL 10 MG PO TABS: 10.0000 mg | ORAL_TABLET | Freq: Three times a day (TID) | ORAL | Status: DC

## 2014-05-01 NOTE — Telephone Encounter (Signed)
Dr. Carlean Purl refilled the Reglan 05/01/14.

## 2014-05-21 ENCOUNTER — Other Ambulatory Visit: Payer: Self-pay | Admitting: Licensed Clinical Social Worker

## 2014-05-21 MED ORDER — LEDIPASVIR-SOFOSBUVIR 90-400 MG PO TABS: 1.0000 | ORAL_TABLET | Freq: Every day | ORAL | Status: DC

## 2014-05-28 ENCOUNTER — Other Ambulatory Visit: Payer: Self-pay | Admitting: *Deleted

## 2014-05-28 ENCOUNTER — Other Ambulatory Visit: Payer: Self-pay | Admitting: Pharmacist Clinician (PhC)/ Clinical Pharmacy Specialist

## 2014-05-28 DIAGNOSIS — K219 Gastro-esophageal reflux disease without esophagitis: Secondary | ICD-10-CM

## 2014-05-28 MED ORDER — OMEPRAZOLE 20 MG PO CPDR: 20.0000 mg | DELAYED_RELEASE_CAPSULE | Freq: Every day | ORAL | Status: DC

## 2014-05-29 ENCOUNTER — Telehealth: Payer: Self-pay | Admitting: *Deleted

## 2014-05-29 NOTE — Telephone Encounter (Signed)
Left message for patient to call back  

## 2014-05-29 NOTE — Telephone Encounter (Signed)
Message copied by Landis Gandy on Wed May 29, 2014 12:15 PM ------      Message from: Thayer Headings      Created: Wed May 29, 2014 12:02 PM       Surprisingly, she was apparanetly approved for Harvoni and may have already heard from pharmacy.  She should follow up with me 4 weeks after starting.  thanks ------

## 2014-05-30 NOTE — Telephone Encounter (Signed)
Spoke with patietn. She picked up her Harvoni, Omperazole 20mg . Knows to take together on empty stomach. Also recently prescribed levothyroxine 40mcg. Per Dr. Linus Salmons, ok to take all 3 together on empty stomach first thing in am.  Patient given follow up appointment 9/3. Landis Gandy, RN

## 2014-06-27 ENCOUNTER — Encounter: Payer: Self-pay | Admitting: Internal Medicine

## 2014-06-27 ENCOUNTER — Ambulatory Visit (INDEPENDENT_AMBULATORY_CARE_PROVIDER_SITE_OTHER): Payer: Medicare Other | Admitting: Internal Medicine

## 2014-06-27 VITALS — BP 126/89 | HR 103 | Temp 98.3°F | Wt 117.0 lb

## 2014-06-27 DIAGNOSIS — B182 Chronic viral hepatitis C: Secondary | ICD-10-CM

## 2014-06-27 DIAGNOSIS — Z23 Encounter for immunization: Secondary | ICD-10-CM

## 2014-06-27 LAB — CBC WITH DIFFERENTIAL/PLATELET
BASOS PCT: 1 % (ref 0–1)
Basophils Absolute: 0.1 10*3/uL (ref 0.0–0.1)
EOS ABS: 0.4 10*3/uL (ref 0.0–0.7)
EOS PCT: 7 % — AB (ref 0–5)
HEMATOCRIT: 43.6 % (ref 36.0–46.0)
Hemoglobin: 14.6 g/dL (ref 12.0–15.0)
Lymphocytes Relative: 55 % — ABNORMAL HIGH (ref 12–46)
Lymphs Abs: 3.2 10*3/uL (ref 0.7–4.0)
MCH: 29.1 pg (ref 26.0–34.0)
MCHC: 33.5 g/dL (ref 30.0–36.0)
MCV: 86.9 fL (ref 78.0–100.0)
MONO ABS: 0.5 10*3/uL (ref 0.1–1.0)
MONOS PCT: 9 % (ref 3–12)
Neutro Abs: 1.7 10*3/uL (ref 1.7–7.7)
Neutrophils Relative %: 28 % — ABNORMAL LOW (ref 43–77)
Platelets: 306 10*3/uL (ref 150–400)
RBC: 5.02 MIL/uL (ref 3.87–5.11)
RDW: 14.8 % (ref 11.5–15.5)
WBC: 5.9 10*3/uL (ref 4.0–10.5)

## 2014-06-27 LAB — COMPLETE METABOLIC PANEL WITH GFR
ALBUMIN: 4.2 g/dL (ref 3.5–5.2)
ALK PHOS: 56 U/L (ref 39–117)
ALT: 15 U/L (ref 0–35)
AST: 20 U/L (ref 0–37)
BILIRUBIN TOTAL: 0.5 mg/dL (ref 0.2–1.2)
BUN: 27 mg/dL — ABNORMAL HIGH (ref 6–23)
CO2: 29 mEq/L (ref 19–32)
CREATININE: 1.44 mg/dL — AB (ref 0.50–1.10)
Calcium: 10.6 mg/dL — ABNORMAL HIGH (ref 8.4–10.5)
Chloride: 96 mEq/L (ref 96–112)
GFR, EST NON AFRICAN AMERICAN: 43 mL/min — AB
GFR, Est African American: 49 mL/min — ABNORMAL LOW
GLUCOSE: 162 mg/dL — AB (ref 70–99)
Potassium: 4.7 mEq/L (ref 3.5–5.3)
Sodium: 138 mEq/L (ref 135–145)
Total Protein: 7.9 g/dL (ref 6.0–8.3)

## 2014-06-27 NOTE — Assessment & Plan Note (Signed)
Doing great.  Labs today and rtc 4-5 weeks.

## 2014-06-27 NOTE — Progress Notes (Signed)
   Subjective:    Patient ID: Tammy Moses, female    DOB: [birth year], 48 y.o.   MRN: [id number]  HPI  Here for follow up of hep C, genotype 1b, F0.  Started about 3 weeks ago with Harvoni and feels like she has less fatigue.  Greatful to be on it.  No missed doses and has already called to pick up next month supply.  No headache.  Review of Systems  Constitutional: Negative for fatigue and unexpected weight change.  Gastrointestinal: Negative for diarrhea.  Skin: Negative for rash.  Neurological: Negative for dizziness and headaches.       Objective:   Physical Exam  Constitutional: She appears well-developed and well-nourished.  Eyes: No scleral icterus.  Cardiovascular: Normal rate, regular rhythm and normal heart sounds.   No murmur heard. Pulmonary/Chest: Effort normal and breath sounds normal. No respiratory distress.  Skin: No rash noted.          Assessment & Plan:

## 2014-06-30 LAB — HEPATITIS C RNA QUANTITATIVE
HCV Quantitative Log: 2.13 {Log} — ABNORMAL HIGH (ref <1.18)
HCV Quantitative: 134 IU/mL — ABNORMAL HIGH (ref <15)

## 2014-08-01 ENCOUNTER — Other Ambulatory Visit: Payer: Self-pay | Admitting: Specialist

## 2014-08-01 DIAGNOSIS — I639 Cerebral infarction, unspecified: Secondary | ICD-10-CM

## 2014-08-05 ENCOUNTER — Other Ambulatory Visit: Payer: Self-pay | Admitting: Specialist

## 2014-08-05 DIAGNOSIS — M5416 Radiculopathy, lumbar region: Secondary | ICD-10-CM

## 2014-08-06 ENCOUNTER — Ambulatory Visit
Admission: RE | Admit: 2014-08-06 | Discharge: 2014-08-06 | Disposition: A | Discharge status: Home / Self Care | Payer: PRIVATE HEALTH INSURANCE | Source: Ambulatory Visit | Attending: Specialist | Admitting: Specialist

## 2014-08-06 DIAGNOSIS — M5416 Radiculopathy, lumbar region: Secondary | ICD-10-CM

## 2014-08-06 DIAGNOSIS — I639 Cerebral infarction, unspecified: Secondary | ICD-10-CM

## 2014-08-08 ENCOUNTER — Ambulatory Visit: Payer: Medicare Other | Admitting: Internal Medicine

## 2014-08-12 ENCOUNTER — Ambulatory Visit: Payer: Medicare Other | Admitting: Internal Medicine

## 2014-08-22 ENCOUNTER — Telehealth: Payer: Self-pay

## 2014-08-22 NOTE — Telephone Encounter (Signed)
Left message for patient to call me.  I need to confirm what PPI she is taking.  [employer] sent over a fax saying both omeprazole 20mg  & 40mg  and pantoprazole 40mg .

## 2014-08-26 ENCOUNTER — Ambulatory Visit: Payer: Medicare Other

## 2014-08-27 NOTE — Telephone Encounter (Signed)
Per patient, she is only taking omeprazole. She no longer takes pantoprazole.

## 2014-09-17 ENCOUNTER — Ambulatory Visit: Payer: PRIVATE HEALTH INSURANCE | Attending: Specialist | Admitting: Physical Therapy

## 2014-09-17 ENCOUNTER — Encounter: Payer: Self-pay | Admitting: Physical Therapy

## 2014-09-17 DIAGNOSIS — M6281 Muscle weakness (generalized): Secondary | ICD-10-CM | POA: Diagnosis not present

## 2014-09-17 DIAGNOSIS — M5416 Radiculopathy, lumbar region: Secondary | ICD-10-CM | POA: Insufficient documentation

## 2014-09-17 DIAGNOSIS — Z5189 Encounter for other specified aftercare: Secondary | ICD-10-CM | POA: Diagnosis not present

## 2014-09-17 DIAGNOSIS — R269 Unspecified abnormalities of gait and mobility: Secondary | ICD-10-CM | POA: Insufficient documentation

## 2014-09-17 DIAGNOSIS — G629 Polyneuropathy, unspecified: Secondary | ICD-10-CM | POA: Diagnosis not present

## 2014-09-18 NOTE — Therapy (Signed)
Physical Therapy Evaluation  Patient Details  Name: Tammy Moses MRN: [id number] Date of Birth: 1966/10/16  Encounter Date: 09/17/2014      PT End of Session - 09/18/14 0814    Visit Number 1  G1   Number of Visits 17   Date for PT Re-Evaluation 11/17/14   PT Start Time 0852   PT Stop Time 0934   PT Time Calculation (min) 42 min      Past Medical History  Diagnosis Date  . Hypertension   . Hyperlipidemia   . Diabetes mellitus   . GERD (gastroesophageal reflux disease)   . Constipation, chronic   . Glaucoma   . Diabetic retinopathy   . Blind right eye   . Anemia   . Dental caries   . High risk sexual behavior   . Vitamin D deficiency   . Microalbuminuria   . Anxiety   . H. pylori infection   . Hepatitis C carrier   . Diabetic neuropathy   . Tobacco dependence   . Nonspecific elevation of levels of transaminase or lactic acid dehydrogenase (LDH)   . Insomnia     Past Surgical History  Procedure Laterality Date  . Abdominal hysterectomy  04/30/2009    PARTIAL  . Esophagogastroduodenoscopy    . Colonoscopy      There were no vitals taken for this visit.  Visit Diagnosis:  Abnormality of gait - Plan: PT plan of care cert/re-cert  Generalized muscle weakness - Plan: PT plan of care cert/re-cert      Subjective Assessment - 09/17/14 0859    Symptoms Pt presents to [facility] PT with diagnosis of peripheral neuropathy (G62.9) and lumbar radiculopathy (M54.16).  She has complaints of decreased strength in hands, pain in legs and feet, decreased balance, back pain.  Pt reports foot slapping in RLE summer of 2014.  Pt presents with rollator walker, which is new in the past 3 days.  Previous to this, she was using cane.     Pertinent History Pt has approximately one year history of R foot drop, peripheral neuropathy   Patient Stated Goals To walker better and improve balance   Currently in Pain? Yes   Pain Score 7    Pain Location Back   Pain Orientation Lower;Mid   Pain Descriptors / Indicators Sharp   Pain Type Chronic pain   Pain Radiating Towards R leg   Pain Onset More than a month ago   Pain Frequency Constant   Aggravating Factors  cold weather, walking too much without walker   Pain Relieving Factors Pain medication alleviates pain; pain cream alleviates          [facility] PT Assessment - 09/17/14 0922    Assessment   Medical Diagnosis peripheral neuropathy, lumbar radiculopathy   Onset Date --  1 year ago   Precautions   Precautions Fall  Blind R eye; Hepatitis C   Balance Screen   Has the patient fallen in the past 6 months Yes   How many times? 10   Has the patient had a decrease in activity level because of a fear of falling?  Yes   Is the patient reluctant to leave their home because of a fear of falling?  Yes   [place] Private residence   Living Arrangements Spouse/significant other   Available Help at Discharge Family   Type of [place] to enter;Level entry  [place] farther around complex to  get to level entry   Entrance Stairs-Number of Steps 3   Entrance Stairs-Rails None   Home Layout One level   [place] - 4 wheels;Cane - single point;Bedside commode;Shower seat   Prior Function   Level of Independence Needs assistance with homemaking;Needs assistance with ADLs  Aide for ADLs, supervision with gait   Strength   Overall Strength Deficits   Right Hip Flexion 3-/5   Left Hip Flexion 4/5   Right Knee Flexion 3/5   Right Knee Extension 3/5   Left Knee Flexion 4/5   Left Knee Extension 4/5   Right Ankle Dorsiflexion 1/5   Left Ankle Dorsiflexion 3-/5   Ambulation/Gait   Ambulation/Gait Yes   Ambulation/Gait Assistance 5: Supervision   Ambulation Distance (Feet) 120 Feet   Assistive device 4-wheeled walker   Gait Pattern Decreased dorsiflexion - right;Decreased dorsiflexion - left;Right steppage;Wide base of support   Gait velocity 25.27 sec (1.3  ft/sec)   Balance   Balance Assessed Yes  eyes open unsupported 30 sec; unable to stand eyes closed   Standardized Balance Assessment   Standardized Balance Assessment Timed Up and Go Test   Timed Up and Go Test   TUG Normal TUG   Normal TUG (seconds) 25.09  rollator; 29.38 sec with cane              PT Short Term Goals - 09/18/14 [id number]    PT SHORT TERM GOAL #1   Title be independent with HEP to address strength, balance.   Baseline No current HEP   Time 4   Period Weeks   Status New   PT SHORT TERM GOAL #2   Title improve TUG score to less than or equal to 20 seconds for decreased fall risk.   Baseline TUG score:  25.09 sec (Scores >13.35 sec indicates fall risk.)   Time 4   Period Weeks   Status New   PT SHORT TERM GOAL #3   Title improve Berg Balance score by at least 5 points for decreased fall risk.   Baseline Pt able to stand 30 seconds unsupported with increased sway.  Unable to stand unsupported.   Time 4   Period Weeks   Status New   PT SHORT TERM GOAL #4   Title verbalize at least 3 means to decrease low back pain   Baseline Pt reports pain 7/10, constant, longstanding   Time 4   Period Weeks   Status New          PT Long Term Goals - 09/18/14 [id number]    PT LONG TERM GOAL #1   Title verbalize understanding of fall prevention techniques within the home environment.   Baseline pt at fall risk per TUG and gait velocity scores; history of 10 falls in past 6 months   Time 8   Period Weeks   Status New   PT LONG TERM GOAL #2   Title improve TUG score to less than or equal to 15 seconds for decreased fall risk.   Baseline TUG score 24.09 sec (>13.5 sec indicates increased fall risk.)   Time 8   Period Weeks   Status New   PT LONG TERM GOAL #3   Title improve gait velocity to at least 1.8 ft/sec for decreased fall risk.   Baseline gait velocity 1.3 ft/sec (<1.8 ft/sec indicates recurrent fall risk.)   Time 8   Period Weeks   Status New   PT LONG TERM  GOAL #4  Title verbalize/demonstrate safe use of rollator walker for at least 500 ft. for improved independence and safety with gait.   Baseline pt has obtained rollator walker in 3 days prior to eval   Time 8   Period Weeks   Status New          Plan - 2014-10-17 0816    Clinical Impression Statement Pt is a 48 year old female who presents with at least year history of worsening R foot drop to steppage gait pattern, due to peripheral neuropathy.  She also has complaints of low back pain, which is longstanding.  She presents with decreased balance over the past year, with at least 10 falls in the past 6 months.  She has recently begun using rollator walker and desires to have better balance.  She also reports having significant difficulty with UE fine motor coordination and ADL tasks due to decreased hand/UE strength.  Pt would likely benefit from occupational therapy evaluation.   Pt will benefit from skilled therapeutic intervention in order to improve on the following deficits Abnormal gait;Decreased balance;Decreased mobility;Decreased knowledge of use of DME;Decreased strength;Difficulty walking;Pain   Rehab Potential Good   PT Frequency 2x / week   PT Duration 8 weeks   PT Treatment/Interventions ADLs/Self Care Home Management;Functional mobility training;Gait training;DME Instruction;Therapeutic activities;Therapeutic exercise;Balance training;Neuromuscular re-education;Patient/family education   PT Next Visit Plan Perform BERG test; Initiate HEP for lower extremity strengthening, balance; pt to bring ankle brace/trial AFO?   PT Home Exercise Plan Plan to initiate for hip, lower extremity strengthening/stretching at ankles, standing balance exercises at counter   Recommended Other Services Recommend OT evaluation   Consulted and Agree with Plan of Care Patient;Family member/caregiver          G-Codes - October 17, 2014 [id number]    Functional Assessment Tool Used TUG, gait velocity   Functional  Limitation Mobility: Walking and moving around   Mobility: Walking and Moving Around Current Status [phone]) At least 60 percent but less than 80 percent impaired, limited or restricted   Mobility: Walking and Moving Around Goal Status [phone]) At least 20 percent but less than 40 percent impaired, limited or restricted      Problem List Patient Active Problem List   Diagnosis Date Noted  . Gait abnormality 03/07/2014  . Abnormality of gait 03/07/2014  . Hepatitis C virus infection without hepatic coma 03/04/2014  . Erosive gastritis 02/14/2014  . BACK PAIN, LUMBAR 08/18/2010  . IDDM 08/11/2010  . HYPERCHOLESTEROLEMIA 08/11/2010  . HYPOKALEMIA 08/11/2010  . DEPRESSION 08/11/2010  . GLAUCOMA 08/11/2010  . BLINDNESS, RIGHT EYE 08/11/2010  . HYPERTENSION 08/11/2010  . GERD 08/11/2010  . CONSTIPATION 08/06/2009                                              Donevan Biller W. 2014/10/17, 10:25 AM      Mady Haagensen, PT 10/17/2014 10:26 AM Phone: [phone] Fax: [phone]

## 2014-09-24 ENCOUNTER — Ambulatory Visit: Payer: PRIVATE HEALTH INSURANCE | Attending: Specialist | Admitting: Physical Therapy

## 2014-09-24 DIAGNOSIS — G629 Polyneuropathy, unspecified: Secondary | ICD-10-CM | POA: Insufficient documentation

## 2014-09-24 DIAGNOSIS — M6281 Muscle weakness (generalized): Secondary | ICD-10-CM | POA: Insufficient documentation

## 2014-09-24 DIAGNOSIS — R269 Unspecified abnormalities of gait and mobility: Secondary | ICD-10-CM | POA: Insufficient documentation

## 2014-09-24 DIAGNOSIS — Z5189 Encounter for other specified aftercare: Secondary | ICD-10-CM | POA: Insufficient documentation

## 2014-09-24 DIAGNOSIS — M5416 Radiculopathy, lumbar region: Secondary | ICD-10-CM | POA: Insufficient documentation

## 2014-09-26 ENCOUNTER — Ambulatory Visit: Payer: PRIVATE HEALTH INSURANCE | Admitting: Physical Therapy

## 2014-10-01 ENCOUNTER — Ambulatory Visit: Payer: PRIVATE HEALTH INSURANCE

## 2014-10-01 DIAGNOSIS — M6281 Muscle weakness (generalized): Secondary | ICD-10-CM | POA: Diagnosis not present

## 2014-10-01 DIAGNOSIS — G629 Polyneuropathy, unspecified: Secondary | ICD-10-CM | POA: Diagnosis not present

## 2014-10-01 DIAGNOSIS — R269 Unspecified abnormalities of gait and mobility: Secondary | ICD-10-CM | POA: Diagnosis not present

## 2014-10-01 DIAGNOSIS — Z5189 Encounter for other specified aftercare: Secondary | ICD-10-CM | POA: Diagnosis present

## 2014-10-01 DIAGNOSIS — M5416 Radiculopathy, lumbar region: Secondary | ICD-10-CM | POA: Diagnosis not present

## 2014-10-01 NOTE — Therapy (Signed)
[facility] [address] [place], [place], [address] Phone: [phone]   Fax:  [phone]  Physical Therapy Treatment  Patient Details  Name: Tammy Moses MRN: [id number] Date of Birth: 1966-10-19  Encounter Date: 10/01/2014      PT End of Session - 10/01/14 1141    Visit Number 2   Number of Visits 17   Date for PT Re-Evaluation 11/17/14   PT Start Time 1112   PT Stop Time 1143   PT Time Calculation (min) 31 min      Past Medical History  Diagnosis Date  . Hypertension   . Hyperlipidemia   . Diabetes mellitus   . GERD (gastroesophageal reflux disease)   . Constipation, chronic   . Glaucoma   . Diabetic retinopathy   . Blind right eye   . Anemia   . Dental caries   . High risk sexual behavior   . Vitamin D deficiency   . Microalbuminuria   . Anxiety   . H. pylori infection   . Hepatitis C carrier   . Diabetic neuropathy   . Tobacco dependence   . Nonspecific elevation of levels of transaminase or lactic acid dehydrogenase (LDH)   . Insomnia     Past Surgical History  Procedure Laterality Date  . Abdominal hysterectomy  04/30/2009    PARTIAL  . Esophagogastroduodenoscopy    . Colonoscopy      There were no vitals taken for this visit.  Visit Diagnosis:  Abnormality of gait  Generalized muscle weakness      Subjective Assessment - 10/01/14 1113    Symptoms Pt arrived 10 minutes late. Pt reported she fell Sunday night (09/29/14) while walking with her cane and holding a bowl. Pt reported she believes she fell due to R toe dragging, so pt is now using rollator all the time.   Pertinent History Pt has approximately one year history of R foot drop, peripheral neuropathy   Patient Stated Goals To walker better and improve balance   Currently in Pain? Yes   Pain Score 3    Pain Location Foot   Pain Orientation Left   Pain Descriptors / Indicators Burning   Pain Type Chronic pain;Other (Comment)  nerve  pain   Pain Onset More than a month ago   Pain Frequency Intermittent   Aggravating Factors  cold weather, long distance walking   Pain Relieving Factors medication and seated rest breaks.            [facility] PT Treatment/Exercise - 10/01/14 1116    Ambulation/Gait   Ambulation/Gait Yes   Ambulation/Gait Assistance 5: Supervision   Ambulation/Gait Assistance Details Pt ambulated with R donjoy brace and rollator over even terrain. VC's to improve R heel strike, upright posture, and to walk in a straight path.   Ambulation Distance (Feet) --  58'   Assistive device 4-wheeled walker   Gait Pattern Decreased dorsiflexion - right;Decreased dorsiflexion - left;Right steppage;Wide base of support   Standardized Balance Assessment   Standardized Balance Assessment Berg Balance Test   Berg Balance Test   Sit to Stand Able to stand  independently using hands   Standing Unsupported Able to stand 2 minutes with supervision   Sitting with Back Unsupported but Feet Supported on Floor or Stool Able to sit safely and securely 2 minutes   Stand to Sit Controls descent by using hands   Transfers Able to transfer with verbal cueing and /or supervision  with supervision  and use of hands   Standing Unsupported with Eyes Closed Able to stand 10 seconds with supervision   Standing Ubsupported with Feet Together Needs help to attain position but able to stand for 30 seconds with feet together   From Standing, Reach Forward with Outstretched Arm Can reach forward >5 cm safely (2")   From Standing Position, Pick up Object from Floor Unable to try/needs assist to keep balance  need assist to maintain balance but is able to pick up   From Standing Position, Turn to Look Behind Over each Shoulder Turn sideways only but maintains balance   Turn 360 Degrees Needs assistance while turning   Standing Unsupported, Alternately Place Feet on Step/Stool Needs assistance to keep from falling or unable to try    Standing Unsupported, One Foot in Front Needs help to step but can hold 15 seconds   Standing on One Leg Tries to lift leg/unable to hold 3 seconds but remains standing independently   Total Score 25   Exercises   Exercises Knee/Hip   Knee/Hip Exercises: Aerobic   Stationary Bike Nustep: level 2.0 with B LE only x8 minutes. VC's to keep entire foot flat on pedals, as pt pedals with toes only.           PT Education - 10/01/14 1309    Education provided Yes   Education Details Education on gym activities and increasing time or resistance on Nustep, not both at the same time. PT also reiterated the importance of walking daily with pt's aide, to improve strength and endurance. PT encouraged pt to use rollator at all times, to decr. falls risk.   Person(s) Educated Patient   Methods Explanation;Demonstration;Verbal cues   Comprehension Verbal cues required;Returned demonstration;Verbalized understanding          PT Short Term Goals - 10/01/14 1313    PT SHORT TERM GOAL #1   Title be independent with HEP to address strength, balance.   Baseline No current HEP   Time 4   Period Weeks   Status On-going   PT SHORT TERM GOAL #2   Title improve TUG score to less than or equal to 20 seconds for decreased fall risk.   Baseline TUG score:  25.09 sec (Scores >13.35 sec indicates fall risk.)   Time 4   Period Weeks   Status On-going   PT SHORT TERM GOAL #3   Title improve Berg Balance score by at least 5 points for decreased fall risk.   Baseline Pt able to stand 30 seconds unsupported with increased sway.  Unable to stand unsupported. BERG score on 10/01/14: 25/56.   Time 4   Period Weeks   Status On-going   PT SHORT TERM GOAL #4   Title verbalize at least 3 means to decrease low back pain   Baseline Pt reports pain 7/10, constant, longstanding   Time 4   Period Weeks   Status On-going          PT Long Term Goals - 10/01/14 1314    PT LONG TERM GOAL #1   Title verbalize  understanding of fall prevention techniques within the home environment.   Baseline pt at fall risk per TUG and gait velocity scores; history of 10 falls in past 6 months   Time 8   Period Weeks   Status On-going   PT LONG TERM GOAL #2   Title improve TUG score to less than or equal to 15 seconds for decreased fall risk.  Baseline TUG score 24.09 sec (>13.5 sec indicates increased fall risk.)   Time 8   Period Weeks   Status On-going   PT LONG TERM GOAL #3   Title improve gait velocity to at least 1.8 ft/sec for decreased fall risk.   Baseline gait velocity 1.3 ft/sec (<1.8 ft/sec indicates recurrent fall risk.)   Time 8   Period Weeks   Status On-going   PT LONG TERM GOAL #4   Title verbalize/demonstrate safe use of rollator walker for at least 500 ft. for improved independence and safety with gait.   Baseline pt has obtained rollator walker in 3 days prior to eval   Time 8   Period Weeks   Status On-going          Plan - 10/01/14 1310    Clinical Impression Statement Pt scored 25/56 on BERG balance, indicating pt is at high risk for falls. Pt continues to experience R foot drop and steppage gait pattern and would benefit from AFO trial next session, as pt wore a Donjoy ankle brace that did not assist R ankle dorsiflexion. Pt would continue to benefit from skilled therapy to improve safety during functional mobilty.   Pt will benefit from skilled therapeutic intervention in order to improve on the following deficits Abnormal gait;Decreased balance;Decreased mobility;Decreased knowledge of use of DME;Decreased strength;Difficulty walking;Pain   Rehab Potential Good   PT Frequency 2x / week   PT Duration 8 weeks   PT Treatment/Interventions ADLs/Self Care Home Management;Functional mobility training;Gait training;DME Instruction;Therapeutic activities;Therapeutic exercise;Balance training;Neuromuscular re-education;Patient/family education   PT Next Visit Plan Provide balance and  LE strengthening HEP. Trial R AFO during gait trianing.   Consulted and Agree with Plan of Care Patient                               Problem List Patient Active Problem List   Diagnosis Date Noted  . Gait abnormality 03/07/2014  . Abnormality of gait 03/07/2014  . Hepatitis C virus infection without hepatic coma 03/04/2014  . Erosive gastritis 02/14/2014  . BACK PAIN, LUMBAR 08/18/2010  . IDDM 08/11/2010  . HYPERCHOLESTEROLEMIA 08/11/2010  . HYPOKALEMIA 08/11/2010  . DEPRESSION 08/11/2010  . GLAUCOMA 08/11/2010  . BLINDNESS, RIGHT EYE 08/11/2010  . HYPERTENSION 08/11/2010  . GERD 08/11/2010  . CONSTIPATION 08/06/2009    Peterson Mathey L 10/01/2014, 1:18 PM     Geoffry Paradise, PT,DPT 10/01/2014 1:18 PM Phone: [phone] Fax: [phone]

## 2014-10-03 ENCOUNTER — Ambulatory Visit: Payer: PRIVATE HEALTH INSURANCE

## 2014-10-03 ENCOUNTER — Telehealth: Payer: Self-pay

## 2014-10-03 NOTE — Telephone Encounter (Signed)
PT called pt regarding no-show to 10/03/14 appt., pt stated she overslept and forgot about appt. PT reminded pt of next appt. Date/time and pt verbalized she will make that appt.

## 2014-10-08 ENCOUNTER — Ambulatory Visit: Payer: PRIVATE HEALTH INSURANCE

## 2014-10-08 DIAGNOSIS — M6281 Muscle weakness (generalized): Secondary | ICD-10-CM

## 2014-10-08 DIAGNOSIS — Z5189 Encounter for other specified aftercare: Secondary | ICD-10-CM | POA: Diagnosis not present

## 2014-10-08 DIAGNOSIS — R269 Unspecified abnormalities of gait and mobility: Secondary | ICD-10-CM

## 2014-10-08 NOTE — Therapy (Signed)
[facility] [address] [place], [place], [address] Phone: [phone]   Fax:  [phone]  Physical Therapy Treatment  Patient Details  Name: Tammy Moses MRN: [id number] Date of Birth: June 29, 1966  Encounter Date: 10/08/2014      PT End of Session - 10/08/14 0937    Visit Number 3   Number of Visits 17   Date for PT Re-Evaluation 11/17/14   PT Start Time 0846   PT Stop Time 0928   PT Time Calculation (min) 42 min   Equipment Utilized During Treatment Gait belt   Activity Tolerance Patient tolerated treatment well   Behavior During Therapy [facility] for tasks assessed/performed      Past Medical History  Diagnosis Date  . Hypertension   . Hyperlipidemia   . Diabetes mellitus   . GERD (gastroesophageal reflux disease)   . Constipation, chronic   . Glaucoma   . Diabetic retinopathy   . Blind right eye   . Anemia   . Dental caries   . High risk sexual behavior   . Vitamin D deficiency   . Microalbuminuria   . Anxiety   . H. pylori infection   . Hepatitis C carrier   . Diabetic neuropathy   . Tobacco dependence   . Nonspecific elevation of levels of transaminase or lactic acid dehydrogenase (LDH)   . Insomnia     Past Surgical History  Procedure Laterality Date  . Abdominal hysterectomy  04/30/2009    PARTIAL  . Esophagogastroduodenoscopy    . Colonoscopy      There were no vitals taken for this visit.  Visit Diagnosis:  Abnormality of gait  Generalized muscle weakness      Subjective Assessment - 10/08/14 0849    Symptoms "I feel pretty good today." Pt denied falls or changes since last visit.   Patient Stated Goals To walker better and improve balance   Currently in Pain? Yes   Pain Score 2    Pain Location Foot   Pain Orientation Left   Pain Descriptors / Indicators Burning   Pain Type Chronic pain   Pain Onset More than a month ago   Pain Frequency Intermittent   Aggravating Factors  cold  weather, long distance walking   Pain Relieving Factors medication and seated rest breaks.            [facility] Adult PT Treatment/Exercise - 10/08/14 0852    Balance   Balance Assessed Yes   Static Standing Balance   Static Standing - Balance Support No upper extremity supported   Static Standing - Level of Assistance 4: Min assist;Other (comment);5: Stand by assistance  min guard   Static Standing - Comment/# of Minutes Balance activities performed in corner with chair in front of pt for safety, B LE with 10-30 second holds and 3 reps: feet apart/togehter with eyes open/close, with and without head turns, with arms out and at side. VC's for technique and to improve weight shifting. Pt required min A during 4 LOB episodes to maintain balance and UE support to self correct LOB.   Exercises   Exercises Knee/Hip;Ankle   Knee/Hip Exercises: Stretches   Active Hamstring Stretch 3 reps;30 seconds  B LE   Active Hamstring Stretch Limitations VC's, demonstration and tactile cues for technique, to keep back straight and knee extended.   Gastroc Stretch 3 reps;30 seconds  R LE   Gastroc Stretch Limitations performed with sheet under foot, vc's for technique.   Ankle  Exercises: Seated   Toe Raise 20 reps   Toe Raise Limitations B LE, pt unable to perform R dorsiflexion due to decr. strength. vc's for technique.          PT Education - 10/08/14 0936    Education provided Yes   Education Details HEP. PT educated pt on the importance of getting DME provider to tighten rollator brakes for safety.   Person(s) Educated Patient   Methods Explanation;Demonstration;Tactile cues;Verbal cues;Handout   Comprehension Verbalized understanding;Returned demonstration;Need further instruction          PT Short Term Goals - 10/08/14 0941    PT SHORT TERM GOAL #1   Title be independent with HEP to address strength, balance.   Baseline No current HEP   Time 4   Period Weeks   Status On-going   PT  SHORT TERM GOAL #2   Title improve TUG score to less than or equal to 20 seconds for decreased fall risk.   Baseline TUG score:  25.09 sec (Scores >13.35 sec indicates fall risk.)   Time 4   Period Weeks   Status On-going   PT SHORT TERM GOAL #3   Title improve Berg Balance score by at least 5 points for decreased fall risk.   Baseline Pt able to stand 30 seconds unsupported with increased sway.  Unable to stand unsupported. BERG score on 10/01/14: 25/56.   Time 4   Period Weeks   Status On-going   PT SHORT TERM GOAL #4   Title verbalize at least 3 means to decrease low back pain   Baseline Pt reports pain 7/10, constant, longstanding   Time 4   Period Weeks   Status On-going          PT Long Term Goals - 10/08/14 0941    PT LONG TERM GOAL #1   Title verbalize understanding of fall prevention techniques within the home environment.   Baseline pt at fall risk per TUG and gait velocity scores; history of 10 falls in past 6 months   Time 8   Period Weeks   Status On-going   PT LONG TERM GOAL #2   Title improve TUG score to less than or equal to 15 seconds for decreased fall risk.   Baseline TUG score 24.09 sec (>13.5 sec indicates increased fall risk.)   Time 8   Period Weeks   Status On-going   PT LONG TERM GOAL #3   Title improve gait velocity to at least 1.8 ft/sec for decreased fall risk.   Baseline gait velocity 1.3 ft/sec (<1.8 ft/sec indicates recurrent fall risk.)   Time 8   Period Weeks   Status On-going   PT LONG TERM GOAL #4   Title verbalize/demonstrate safe use of rollator walker for at least 500 ft. for improved independence and safety with gait.   Baseline pt has obtained rollator walker in 3 days prior to eval   Time 8   Period Weeks   Status On-going          Plan - 10/08/14 [id number]    Clinical Impression Statement Pt tolerated HEP well, and was very motivated to participate in PT. Pt continues to experience R foot drop during ambulation and would  benefit from AFO trial. Pt would continue to benefit from skilled therapy to improve functional mobility.   Pt will benefit from skilled therapeutic intervention in order to improve on the following deficits Abnormal gait;Decreased balance;Decreased mobility;Decreased knowledge of use of DME;Decreased strength;Difficulty walking;Pain  Rehab Potential Good   PT Frequency 2x / week   PT Duration 8 weeks   PT Treatment/Interventions ADLs/Self Care Home Management;Functional mobility training;Gait training;DME Instruction;Therapeutic activities;Therapeutic exercise;Balance training;Neuromuscular re-education;Patient/family education   PT Next Visit Plan Continue to administer strengthening HEP. Trial R AFO during gait training.   PT Home Exercise Plan Balance, stretching, strengthening HEP.   Consulted and Agree with Plan of Care Patient                               Problem List Patient Active Problem List   Diagnosis Date Noted  . Gait abnormality 03/07/2014  . Abnormality of gait 03/07/2014  . Hepatitis C virus infection without hepatic coma 03/04/2014  . Erosive gastritis 02/14/2014  . BACK PAIN, LUMBAR 08/18/2010  . IDDM 08/11/2010  . HYPERCHOLESTEROLEMIA 08/11/2010  . HYPOKALEMIA 08/11/2010  . DEPRESSION 08/11/2010  . GLAUCOMA 08/11/2010  . BLINDNESS, RIGHT EYE 08/11/2010  . HYPERTENSION 08/11/2010  . GERD 08/11/2010  . CONSTIPATION 08/06/2009    Kashvi Prevette L 10/08/2014, 9:48 AM     Geoffry Paradise, PT,DPT 10/08/2014 9:48 AM Phone: [phone] Fax: [phone]

## 2014-10-08 NOTE — Patient Instructions (Addendum)
Perform all balance activities in a corner with a chair in front of you for safety.  Feet Apart, Varied Arm Positions - Eyes Open   With eyes open, feet shoulder width apart, arms at your side, look straight ahead at a stationary object. Hold _30___ seconds. Repeat __3__ times per session. Do _1___ sessions per day.  Copyright  VHI. All rights reserved.  Feet Apart, Varied Arm Positions - Eyes Closed   Stand with feet shoulder width apart and arms out. Close eyes and visualize upright position. Hold _10-30___ seconds. Repeat _3___ times per session. Do __1__ sessions per day.  Copyright  VHI. All rights reserved.  Feet Apart, Head Motion - Eyes Open   With eyes open, feet apart, move head slowly: up and down and side to side for 10-30 seconds. Repeat _3___ times per session. Do __1__ sessions per day.  Copyright  VHI. All rights reserved.  Feet Together, Varied Arm Positions - Eyes Open   With eyes open, feet together, arms out, look straight ahead at a stationary object. Hold _10-30___ seconds. Repeat __3__ times per session. Do _1___ sessions per day.  Copyright  VHI. All rights reserved.  HIP: Hamstrings - Short Sitting   Rest leg on raised surface, or on floor. Keep knee straight. Lift chest. Hold _30__ seconds. _3__ reps per set, _2__ sets per day, __7_ days per week. Toe Raise (Sitting)  Repeat __20__ times per set. Do __1__ sets per session. Do _1___ sessions per day. Raise toes, keeping heels on floor. Perform with left foot and try to perform with right foot.   http://orth.exer.us/47   Copyright  VHI. All rights reserved.    Stretching: Calf - Towel   Sit with knee straight and towel looped around left foot. Gently pull on towel until stretch is felt in calf. Hold __30__ seconds. Repeat __3__ times per set. Do __1__ sets per session. Do __1__ sessions per day.  http://orth.exer.us/707   Copyright  VHI. All rights reserved.     Copyright  VHI.  All rights reserved.

## 2014-10-10 ENCOUNTER — Ambulatory Visit: Payer: PRIVATE HEALTH INSURANCE | Admitting: Physical Therapy

## 2014-10-15 ENCOUNTER — Ambulatory Visit: Payer: PRIVATE HEALTH INSURANCE | Admitting: Physical Therapy

## 2014-10-15 ENCOUNTER — Telehealth: Payer: Self-pay | Admitting: Physical Therapy

## 2014-10-17 ENCOUNTER — Ambulatory Visit: Payer: PRIVATE HEALTH INSURANCE

## 2014-10-23 NOTE — Telephone Encounter (Signed)
Spoke with patient and informed her that she had no showed for the 5th time and any remaining appointments would be canceled.

## 2014-10-26 DIAGNOSIS — G4700 Insomnia, unspecified: Secondary | ICD-10-CM | POA: Diagnosis not present

## 2014-10-26 DIAGNOSIS — H409 Unspecified glaucoma: Secondary | ICD-10-CM | POA: Diagnosis not present

## 2014-10-27 DIAGNOSIS — H409 Unspecified glaucoma: Secondary | ICD-10-CM | POA: Diagnosis not present

## 2014-10-27 DIAGNOSIS — G4700 Insomnia, unspecified: Secondary | ICD-10-CM | POA: Diagnosis not present

## 2014-10-28 DIAGNOSIS — H409 Unspecified glaucoma: Secondary | ICD-10-CM | POA: Diagnosis not present

## 2014-10-28 DIAGNOSIS — G4700 Insomnia, unspecified: Secondary | ICD-10-CM | POA: Diagnosis not present

## 2014-10-29 DIAGNOSIS — G4700 Insomnia, unspecified: Secondary | ICD-10-CM | POA: Diagnosis not present

## 2014-10-29 DIAGNOSIS — H409 Unspecified glaucoma: Secondary | ICD-10-CM | POA: Diagnosis not present

## 2014-10-30 DIAGNOSIS — H409 Unspecified glaucoma: Secondary | ICD-10-CM | POA: Diagnosis not present

## 2014-10-30 DIAGNOSIS — G4700 Insomnia, unspecified: Secondary | ICD-10-CM | POA: Diagnosis not present

## 2014-10-31 DIAGNOSIS — H409 Unspecified glaucoma: Secondary | ICD-10-CM | POA: Diagnosis not present

## 2014-10-31 DIAGNOSIS — G4700 Insomnia, unspecified: Secondary | ICD-10-CM | POA: Diagnosis not present

## 2014-10-31 DIAGNOSIS — B9681 Helicobacter pylori [H. pylori] as the cause of diseases classified elsewhere: Secondary | ICD-10-CM | POA: Diagnosis not present

## 2014-11-01 DIAGNOSIS — H409 Unspecified glaucoma: Secondary | ICD-10-CM | POA: Diagnosis not present

## 2014-11-01 DIAGNOSIS — G4700 Insomnia, unspecified: Secondary | ICD-10-CM | POA: Diagnosis not present

## 2014-11-02 DIAGNOSIS — G4700 Insomnia, unspecified: Secondary | ICD-10-CM | POA: Diagnosis not present

## 2014-11-02 DIAGNOSIS — H409 Unspecified glaucoma: Secondary | ICD-10-CM | POA: Diagnosis not present

## 2014-11-03 DIAGNOSIS — G4700 Insomnia, unspecified: Secondary | ICD-10-CM | POA: Diagnosis not present

## 2014-11-03 DIAGNOSIS — H409 Unspecified glaucoma: Secondary | ICD-10-CM | POA: Diagnosis not present

## 2014-11-04 DIAGNOSIS — G4700 Insomnia, unspecified: Secondary | ICD-10-CM | POA: Diagnosis not present

## 2014-11-04 DIAGNOSIS — H409 Unspecified glaucoma: Secondary | ICD-10-CM | POA: Diagnosis not present

## 2014-11-05 DIAGNOSIS — H409 Unspecified glaucoma: Secondary | ICD-10-CM | POA: Diagnosis not present

## 2014-11-05 DIAGNOSIS — G4700 Insomnia, unspecified: Secondary | ICD-10-CM | POA: Diagnosis not present

## 2014-11-06 DIAGNOSIS — G4700 Insomnia, unspecified: Secondary | ICD-10-CM | POA: Diagnosis not present

## 2014-11-06 DIAGNOSIS — H409 Unspecified glaucoma: Secondary | ICD-10-CM | POA: Diagnosis not present

## 2014-11-07 DIAGNOSIS — F064 Anxiety disorder due to known physiological condition: Secondary | ICD-10-CM | POA: Diagnosis not present

## 2014-11-07 DIAGNOSIS — J440 Chronic obstructive pulmonary disease with acute lower respiratory infection: Secondary | ICD-10-CM | POA: Diagnosis not present

## 2014-11-07 DIAGNOSIS — G4700 Insomnia, unspecified: Secondary | ICD-10-CM | POA: Diagnosis not present

## 2014-11-07 DIAGNOSIS — I10 Essential (primary) hypertension: Secondary | ICD-10-CM | POA: Diagnosis not present

## 2014-11-07 DIAGNOSIS — H409 Unspecified glaucoma: Secondary | ICD-10-CM | POA: Diagnosis not present

## 2014-11-07 DIAGNOSIS — E108 Type 1 diabetes mellitus with unspecified complications: Secondary | ICD-10-CM | POA: Diagnosis not present

## 2014-11-07 DIAGNOSIS — E559 Vitamin D deficiency, unspecified: Secondary | ICD-10-CM | POA: Diagnosis not present

## 2014-11-08 ENCOUNTER — Other Ambulatory Visit: Payer: Self-pay | Admitting: Geriatric Medicine

## 2014-11-08 ENCOUNTER — Other Ambulatory Visit: Payer: Self-pay | Admitting: Nurse Practitioner

## 2014-11-08 DIAGNOSIS — Z1231 Encounter for screening mammogram for malignant neoplasm of breast: Secondary | ICD-10-CM

## 2014-11-08 DIAGNOSIS — H409 Unspecified glaucoma: Secondary | ICD-10-CM | POA: Diagnosis not present

## 2014-11-08 DIAGNOSIS — G4700 Insomnia, unspecified: Secondary | ICD-10-CM | POA: Diagnosis not present

## 2014-11-09 DIAGNOSIS — G4700 Insomnia, unspecified: Secondary | ICD-10-CM | POA: Diagnosis not present

## 2014-11-09 DIAGNOSIS — H409 Unspecified glaucoma: Secondary | ICD-10-CM | POA: Diagnosis not present

## 2014-11-10 DIAGNOSIS — G4700 Insomnia, unspecified: Secondary | ICD-10-CM | POA: Diagnosis not present

## 2014-11-10 DIAGNOSIS — H409 Unspecified glaucoma: Secondary | ICD-10-CM | POA: Diagnosis not present

## 2014-11-12 DIAGNOSIS — G4700 Insomnia, unspecified: Secondary | ICD-10-CM | POA: Diagnosis not present

## 2014-11-12 DIAGNOSIS — H409 Unspecified glaucoma: Secondary | ICD-10-CM | POA: Diagnosis not present

## 2014-11-13 DIAGNOSIS — H409 Unspecified glaucoma: Secondary | ICD-10-CM | POA: Diagnosis not present

## 2014-11-13 DIAGNOSIS — G4700 Insomnia, unspecified: Secondary | ICD-10-CM | POA: Diagnosis not present

## 2014-11-14 DIAGNOSIS — H409 Unspecified glaucoma: Secondary | ICD-10-CM | POA: Diagnosis not present

## 2014-11-14 DIAGNOSIS — G4700 Insomnia, unspecified: Secondary | ICD-10-CM | POA: Diagnosis not present

## 2014-11-15 DIAGNOSIS — H409 Unspecified glaucoma: Secondary | ICD-10-CM | POA: Diagnosis not present

## 2014-11-15 DIAGNOSIS — G4700 Insomnia, unspecified: Secondary | ICD-10-CM | POA: Diagnosis not present

## 2014-11-16 DIAGNOSIS — G4700 Insomnia, unspecified: Secondary | ICD-10-CM | POA: Diagnosis not present

## 2014-11-16 DIAGNOSIS — H409 Unspecified glaucoma: Secondary | ICD-10-CM | POA: Diagnosis not present

## 2014-11-17 DIAGNOSIS — H409 Unspecified glaucoma: Secondary | ICD-10-CM | POA: Diagnosis not present

## 2014-11-17 DIAGNOSIS — G4700 Insomnia, unspecified: Secondary | ICD-10-CM | POA: Diagnosis not present

## 2014-11-18 ENCOUNTER — Encounter: Payer: Commercial Managed Care - PPO | Admitting: Certified Nurse Midwife

## 2014-11-18 DIAGNOSIS — H409 Unspecified glaucoma: Secondary | ICD-10-CM | POA: Diagnosis not present

## 2014-11-18 DIAGNOSIS — G4700 Insomnia, unspecified: Secondary | ICD-10-CM | POA: Diagnosis not present

## 2014-11-19 ENCOUNTER — Inpatient Hospital Stay: Admission: RE | Admit: 2014-11-19 | Payer: Commercial Managed Care - PPO | Source: Ambulatory Visit

## 2014-11-19 DIAGNOSIS — H409 Unspecified glaucoma: Secondary | ICD-10-CM | POA: Diagnosis not present

## 2014-11-19 DIAGNOSIS — G4700 Insomnia, unspecified: Secondary | ICD-10-CM | POA: Diagnosis not present

## 2014-11-20 ENCOUNTER — Ambulatory Visit: Payer: Commercial Managed Care - PPO

## 2014-11-20 DIAGNOSIS — H409 Unspecified glaucoma: Secondary | ICD-10-CM | POA: Diagnosis not present

## 2014-11-20 DIAGNOSIS — G4700 Insomnia, unspecified: Secondary | ICD-10-CM | POA: Diagnosis not present

## 2014-11-21 ENCOUNTER — Telehealth: Payer: Self-pay | Admitting: Certified Nurse Midwife

## 2014-11-21 DIAGNOSIS — G4700 Insomnia, unspecified: Secondary | ICD-10-CM | POA: Diagnosis not present

## 2014-11-21 DIAGNOSIS — H409 Unspecified glaucoma: Secondary | ICD-10-CM | POA: Diagnosis not present

## 2014-11-21 NOTE — Telephone Encounter (Signed)
Left patient a message reconfirming new appointment date and time and to call back if she had any questions.

## 2014-11-22 DIAGNOSIS — G4700 Insomnia, unspecified: Secondary | ICD-10-CM | POA: Diagnosis not present

## 2014-11-22 DIAGNOSIS — H409 Unspecified glaucoma: Secondary | ICD-10-CM | POA: Diagnosis not present

## 2014-11-23 DIAGNOSIS — G4700 Insomnia, unspecified: Secondary | ICD-10-CM | POA: Diagnosis not present

## 2014-11-23 DIAGNOSIS — H409 Unspecified glaucoma: Secondary | ICD-10-CM | POA: Diagnosis not present

## 2014-11-24 DIAGNOSIS — H409 Unspecified glaucoma: Secondary | ICD-10-CM | POA: Diagnosis not present

## 2014-11-24 DIAGNOSIS — G4700 Insomnia, unspecified: Secondary | ICD-10-CM | POA: Diagnosis not present

## 2014-11-25 DIAGNOSIS — H409 Unspecified glaucoma: Secondary | ICD-10-CM | POA: Diagnosis not present

## 2014-11-25 DIAGNOSIS — G4700 Insomnia, unspecified: Secondary | ICD-10-CM | POA: Diagnosis not present

## 2014-11-26 DIAGNOSIS — G4700 Insomnia, unspecified: Secondary | ICD-10-CM | POA: Diagnosis not present

## 2014-11-26 DIAGNOSIS — H409 Unspecified glaucoma: Secondary | ICD-10-CM | POA: Diagnosis not present

## 2014-11-27 ENCOUNTER — Ambulatory Visit: Payer: Commercial Managed Care - PPO | Admitting: *Deleted

## 2014-11-27 DIAGNOSIS — H409 Unspecified glaucoma: Secondary | ICD-10-CM | POA: Diagnosis not present

## 2014-11-27 DIAGNOSIS — G4700 Insomnia, unspecified: Secondary | ICD-10-CM | POA: Diagnosis not present

## 2014-11-28 DIAGNOSIS — G4700 Insomnia, unspecified: Secondary | ICD-10-CM | POA: Diagnosis not present

## 2014-11-28 DIAGNOSIS — H409 Unspecified glaucoma: Secondary | ICD-10-CM | POA: Diagnosis not present

## 2014-11-29 DIAGNOSIS — G4700 Insomnia, unspecified: Secondary | ICD-10-CM | POA: Diagnosis not present

## 2014-11-29 DIAGNOSIS — H409 Unspecified glaucoma: Secondary | ICD-10-CM | POA: Diagnosis not present

## 2014-11-30 DIAGNOSIS — H409 Unspecified glaucoma: Secondary | ICD-10-CM | POA: Diagnosis not present

## 2014-11-30 DIAGNOSIS — G4700 Insomnia, unspecified: Secondary | ICD-10-CM | POA: Diagnosis not present

## 2014-12-01 DIAGNOSIS — G4700 Insomnia, unspecified: Secondary | ICD-10-CM | POA: Diagnosis not present

## 2014-12-01 DIAGNOSIS — B9681 Helicobacter pylori [H. pylori] as the cause of diseases classified elsewhere: Secondary | ICD-10-CM | POA: Diagnosis not present

## 2014-12-01 DIAGNOSIS — H409 Unspecified glaucoma: Secondary | ICD-10-CM | POA: Diagnosis not present

## 2014-12-02 DIAGNOSIS — H409 Unspecified glaucoma: Secondary | ICD-10-CM | POA: Diagnosis not present

## 2014-12-02 DIAGNOSIS — G4700 Insomnia, unspecified: Secondary | ICD-10-CM | POA: Diagnosis not present

## 2014-12-03 DIAGNOSIS — H409 Unspecified glaucoma: Secondary | ICD-10-CM | POA: Diagnosis not present

## 2014-12-03 DIAGNOSIS — G4700 Insomnia, unspecified: Secondary | ICD-10-CM | POA: Diagnosis not present

## 2014-12-04 DIAGNOSIS — G4700 Insomnia, unspecified: Secondary | ICD-10-CM | POA: Diagnosis not present

## 2014-12-04 DIAGNOSIS — H409 Unspecified glaucoma: Secondary | ICD-10-CM | POA: Diagnosis not present

## 2014-12-05 DIAGNOSIS — E108 Type 1 diabetes mellitus with unspecified complications: Secondary | ICD-10-CM | POA: Diagnosis not present

## 2014-12-05 DIAGNOSIS — K3184 Gastroparesis: Secondary | ICD-10-CM | POA: Diagnosis not present

## 2014-12-05 DIAGNOSIS — H409 Unspecified glaucoma: Secondary | ICD-10-CM | POA: Diagnosis not present

## 2014-12-05 DIAGNOSIS — G4700 Insomnia, unspecified: Secondary | ICD-10-CM | POA: Diagnosis not present

## 2014-12-05 DIAGNOSIS — E559 Vitamin D deficiency, unspecified: Secondary | ICD-10-CM | POA: Diagnosis not present

## 2014-12-05 DIAGNOSIS — G8929 Other chronic pain: Secondary | ICD-10-CM | POA: Diagnosis not present

## 2014-12-05 DIAGNOSIS — I10 Essential (primary) hypertension: Secondary | ICD-10-CM | POA: Diagnosis not present

## 2014-12-06 DIAGNOSIS — H409 Unspecified glaucoma: Secondary | ICD-10-CM | POA: Diagnosis not present

## 2014-12-06 DIAGNOSIS — G4700 Insomnia, unspecified: Secondary | ICD-10-CM | POA: Diagnosis not present

## 2014-12-07 DIAGNOSIS — G4700 Insomnia, unspecified: Secondary | ICD-10-CM | POA: Diagnosis not present

## 2014-12-07 DIAGNOSIS — K5900 Constipation, unspecified: Secondary | ICD-10-CM | POA: Diagnosis not present

## 2014-12-07 DIAGNOSIS — H409 Unspecified glaucoma: Secondary | ICD-10-CM | POA: Diagnosis not present

## 2014-12-08 DIAGNOSIS — G4700 Insomnia, unspecified: Secondary | ICD-10-CM | POA: Diagnosis not present

## 2014-12-08 DIAGNOSIS — H409 Unspecified glaucoma: Secondary | ICD-10-CM | POA: Diagnosis not present

## 2014-12-09 DIAGNOSIS — H409 Unspecified glaucoma: Secondary | ICD-10-CM | POA: Diagnosis not present

## 2014-12-09 DIAGNOSIS — G4700 Insomnia, unspecified: Secondary | ICD-10-CM | POA: Diagnosis not present

## 2014-12-10 DIAGNOSIS — H409 Unspecified glaucoma: Secondary | ICD-10-CM | POA: Diagnosis not present

## 2014-12-10 DIAGNOSIS — G4700 Insomnia, unspecified: Secondary | ICD-10-CM | POA: Diagnosis not present

## 2014-12-11 DIAGNOSIS — H409 Unspecified glaucoma: Secondary | ICD-10-CM | POA: Diagnosis not present

## 2014-12-11 DIAGNOSIS — G4700 Insomnia, unspecified: Secondary | ICD-10-CM | POA: Diagnosis not present

## 2014-12-12 DIAGNOSIS — H409 Unspecified glaucoma: Secondary | ICD-10-CM | POA: Diagnosis not present

## 2014-12-12 DIAGNOSIS — G4700 Insomnia, unspecified: Secondary | ICD-10-CM | POA: Diagnosis not present

## 2014-12-13 DIAGNOSIS — H409 Unspecified glaucoma: Secondary | ICD-10-CM | POA: Diagnosis not present

## 2014-12-13 DIAGNOSIS — G4700 Insomnia, unspecified: Secondary | ICD-10-CM | POA: Diagnosis not present

## 2014-12-14 DIAGNOSIS — H409 Unspecified glaucoma: Secondary | ICD-10-CM | POA: Diagnosis not present

## 2014-12-14 DIAGNOSIS — G4700 Insomnia, unspecified: Secondary | ICD-10-CM | POA: Diagnosis not present

## 2014-12-15 DIAGNOSIS — H409 Unspecified glaucoma: Secondary | ICD-10-CM | POA: Diagnosis not present

## 2014-12-15 DIAGNOSIS — G4700 Insomnia, unspecified: Secondary | ICD-10-CM | POA: Diagnosis not present

## 2014-12-16 DIAGNOSIS — G4700 Insomnia, unspecified: Secondary | ICD-10-CM | POA: Diagnosis not present

## 2014-12-16 DIAGNOSIS — H409 Unspecified glaucoma: Secondary | ICD-10-CM | POA: Diagnosis not present

## 2014-12-16 DIAGNOSIS — G5731 Lesion of lateral popliteal nerve, right lower limb: Secondary | ICD-10-CM | POA: Diagnosis not present

## 2014-12-16 DIAGNOSIS — M21371 Foot drop, right foot: Secondary | ICD-10-CM | POA: Diagnosis not present

## 2014-12-17 DIAGNOSIS — H409 Unspecified glaucoma: Secondary | ICD-10-CM | POA: Diagnosis not present

## 2014-12-17 DIAGNOSIS — G4700 Insomnia, unspecified: Secondary | ICD-10-CM | POA: Diagnosis not present

## 2014-12-18 DIAGNOSIS — G4700 Insomnia, unspecified: Secondary | ICD-10-CM | POA: Diagnosis not present

## 2014-12-18 DIAGNOSIS — H409 Unspecified glaucoma: Secondary | ICD-10-CM | POA: Diagnosis not present

## 2014-12-19 DIAGNOSIS — H409 Unspecified glaucoma: Secondary | ICD-10-CM | POA: Diagnosis not present

## 2014-12-19 DIAGNOSIS — G4700 Insomnia, unspecified: Secondary | ICD-10-CM | POA: Diagnosis not present

## 2014-12-20 DIAGNOSIS — G4700 Insomnia, unspecified: Secondary | ICD-10-CM | POA: Diagnosis not present

## 2014-12-20 DIAGNOSIS — H409 Unspecified glaucoma: Secondary | ICD-10-CM | POA: Diagnosis not present

## 2014-12-21 DIAGNOSIS — G4700 Insomnia, unspecified: Secondary | ICD-10-CM | POA: Diagnosis not present

## 2014-12-21 DIAGNOSIS — H409 Unspecified glaucoma: Secondary | ICD-10-CM | POA: Diagnosis not present

## 2014-12-22 DIAGNOSIS — H409 Unspecified glaucoma: Secondary | ICD-10-CM | POA: Diagnosis not present

## 2014-12-22 DIAGNOSIS — G4700 Insomnia, unspecified: Secondary | ICD-10-CM | POA: Diagnosis not present

## 2014-12-23 DIAGNOSIS — H409 Unspecified glaucoma: Secondary | ICD-10-CM | POA: Diagnosis not present

## 2014-12-23 DIAGNOSIS — G4700 Insomnia, unspecified: Secondary | ICD-10-CM | POA: Diagnosis not present

## 2014-12-24 DIAGNOSIS — G4700 Insomnia, unspecified: Secondary | ICD-10-CM | POA: Diagnosis not present

## 2014-12-24 DIAGNOSIS — H409 Unspecified glaucoma: Secondary | ICD-10-CM | POA: Diagnosis not present

## 2014-12-25 DIAGNOSIS — H409 Unspecified glaucoma: Secondary | ICD-10-CM | POA: Diagnosis not present

## 2014-12-25 DIAGNOSIS — G4700 Insomnia, unspecified: Secondary | ICD-10-CM | POA: Diagnosis not present

## 2014-12-26 DIAGNOSIS — H409 Unspecified glaucoma: Secondary | ICD-10-CM | POA: Diagnosis not present

## 2014-12-26 DIAGNOSIS — G4700 Insomnia, unspecified: Secondary | ICD-10-CM | POA: Diagnosis not present

## 2014-12-27 DIAGNOSIS — H409 Unspecified glaucoma: Secondary | ICD-10-CM | POA: Diagnosis not present

## 2014-12-27 DIAGNOSIS — G4700 Insomnia, unspecified: Secondary | ICD-10-CM | POA: Diagnosis not present

## 2014-12-28 DIAGNOSIS — H409 Unspecified glaucoma: Secondary | ICD-10-CM | POA: Diagnosis not present

## 2014-12-28 DIAGNOSIS — G4700 Insomnia, unspecified: Secondary | ICD-10-CM | POA: Diagnosis not present

## 2014-12-29 DIAGNOSIS — G4700 Insomnia, unspecified: Secondary | ICD-10-CM | POA: Diagnosis not present

## 2014-12-29 DIAGNOSIS — H409 Unspecified glaucoma: Secondary | ICD-10-CM | POA: Diagnosis not present

## 2014-12-30 DIAGNOSIS — B9681 Helicobacter pylori [H. pylori] as the cause of diseases classified elsewhere: Secondary | ICD-10-CM | POA: Diagnosis not present

## 2014-12-30 DIAGNOSIS — H409 Unspecified glaucoma: Secondary | ICD-10-CM | POA: Diagnosis not present

## 2014-12-30 DIAGNOSIS — G4700 Insomnia, unspecified: Secondary | ICD-10-CM | POA: Diagnosis not present

## 2014-12-31 DIAGNOSIS — G4700 Insomnia, unspecified: Secondary | ICD-10-CM | POA: Diagnosis not present

## 2014-12-31 DIAGNOSIS — H409 Unspecified glaucoma: Secondary | ICD-10-CM | POA: Diagnosis not present

## 2015-01-01 DIAGNOSIS — E559 Vitamin D deficiency, unspecified: Secondary | ICD-10-CM | POA: Diagnosis not present

## 2015-01-01 DIAGNOSIS — K5901 Slow transit constipation: Secondary | ICD-10-CM | POA: Diagnosis not present

## 2015-01-01 DIAGNOSIS — G4700 Insomnia, unspecified: Secondary | ICD-10-CM | POA: Diagnosis not present

## 2015-01-01 DIAGNOSIS — E108 Type 1 diabetes mellitus with unspecified complications: Secondary | ICD-10-CM | POA: Diagnosis not present

## 2015-01-01 DIAGNOSIS — I10 Essential (primary) hypertension: Secondary | ICD-10-CM | POA: Diagnosis not present

## 2015-01-01 DIAGNOSIS — K3184 Gastroparesis: Secondary | ICD-10-CM | POA: Diagnosis not present

## 2015-01-01 DIAGNOSIS — H409 Unspecified glaucoma: Secondary | ICD-10-CM | POA: Diagnosis not present

## 2015-01-02 DIAGNOSIS — H409 Unspecified glaucoma: Secondary | ICD-10-CM | POA: Diagnosis not present

## 2015-01-02 DIAGNOSIS — G4700 Insomnia, unspecified: Secondary | ICD-10-CM | POA: Diagnosis not present

## 2015-01-03 ENCOUNTER — Encounter: Payer: Commercial Managed Care - PPO | Admitting: Certified Nurse Midwife

## 2015-01-03 DIAGNOSIS — G4700 Insomnia, unspecified: Secondary | ICD-10-CM | POA: Diagnosis not present

## 2015-01-03 DIAGNOSIS — H409 Unspecified glaucoma: Secondary | ICD-10-CM | POA: Diagnosis not present

## 2015-01-04 DIAGNOSIS — G4700 Insomnia, unspecified: Secondary | ICD-10-CM | POA: Diagnosis not present

## 2015-01-04 DIAGNOSIS — H409 Unspecified glaucoma: Secondary | ICD-10-CM | POA: Diagnosis not present

## 2015-01-05 DIAGNOSIS — H409 Unspecified glaucoma: Secondary | ICD-10-CM | POA: Diagnosis not present

## 2015-01-05 DIAGNOSIS — G4700 Insomnia, unspecified: Secondary | ICD-10-CM | POA: Diagnosis not present

## 2015-01-06 DIAGNOSIS — G4700 Insomnia, unspecified: Secondary | ICD-10-CM | POA: Diagnosis not present

## 2015-01-06 DIAGNOSIS — H409 Unspecified glaucoma: Secondary | ICD-10-CM | POA: Diagnosis not present

## 2015-01-07 DIAGNOSIS — G4700 Insomnia, unspecified: Secondary | ICD-10-CM | POA: Diagnosis not present

## 2015-01-07 DIAGNOSIS — H409 Unspecified glaucoma: Secondary | ICD-10-CM | POA: Diagnosis not present

## 2015-01-08 DIAGNOSIS — I10 Essential (primary) hypertension: Secondary | ICD-10-CM | POA: Diagnosis not present

## 2015-01-08 DIAGNOSIS — E559 Vitamin D deficiency, unspecified: Secondary | ICD-10-CM | POA: Diagnosis not present

## 2015-01-08 DIAGNOSIS — H409 Unspecified glaucoma: Secondary | ICD-10-CM | POA: Diagnosis not present

## 2015-01-08 DIAGNOSIS — E108 Type 1 diabetes mellitus with unspecified complications: Secondary | ICD-10-CM | POA: Diagnosis not present

## 2015-01-08 DIAGNOSIS — K5901 Slow transit constipation: Secondary | ICD-10-CM | POA: Diagnosis not present

## 2015-01-08 DIAGNOSIS — G4700 Insomnia, unspecified: Secondary | ICD-10-CM | POA: Diagnosis not present

## 2015-01-08 DIAGNOSIS — K3184 Gastroparesis: Secondary | ICD-10-CM | POA: Diagnosis not present

## 2015-01-09 DIAGNOSIS — G4700 Insomnia, unspecified: Secondary | ICD-10-CM | POA: Diagnosis not present

## 2015-01-09 DIAGNOSIS — H409 Unspecified glaucoma: Secondary | ICD-10-CM | POA: Diagnosis not present

## 2015-01-10 DIAGNOSIS — G4700 Insomnia, unspecified: Secondary | ICD-10-CM | POA: Diagnosis not present

## 2015-01-10 DIAGNOSIS — H409 Unspecified glaucoma: Secondary | ICD-10-CM | POA: Diagnosis not present

## 2015-01-11 DIAGNOSIS — G4700 Insomnia, unspecified: Secondary | ICD-10-CM | POA: Diagnosis not present

## 2015-01-11 DIAGNOSIS — H409 Unspecified glaucoma: Secondary | ICD-10-CM | POA: Diagnosis not present

## 2015-01-12 DIAGNOSIS — G4700 Insomnia, unspecified: Secondary | ICD-10-CM | POA: Diagnosis not present

## 2015-01-12 DIAGNOSIS — H409 Unspecified glaucoma: Secondary | ICD-10-CM | POA: Diagnosis not present

## 2015-01-13 DIAGNOSIS — H409 Unspecified glaucoma: Secondary | ICD-10-CM | POA: Diagnosis not present

## 2015-01-13 DIAGNOSIS — G4700 Insomnia, unspecified: Secondary | ICD-10-CM | POA: Diagnosis not present

## 2015-01-14 DIAGNOSIS — H409 Unspecified glaucoma: Secondary | ICD-10-CM | POA: Diagnosis not present

## 2015-01-14 DIAGNOSIS — G4700 Insomnia, unspecified: Secondary | ICD-10-CM | POA: Diagnosis not present

## 2015-01-15 DIAGNOSIS — G4700 Insomnia, unspecified: Secondary | ICD-10-CM | POA: Diagnosis not present

## 2015-01-15 DIAGNOSIS — H409 Unspecified glaucoma: Secondary | ICD-10-CM | POA: Diagnosis not present

## 2015-01-16 DIAGNOSIS — H409 Unspecified glaucoma: Secondary | ICD-10-CM | POA: Diagnosis not present

## 2015-01-16 DIAGNOSIS — G4700 Insomnia, unspecified: Secondary | ICD-10-CM | POA: Diagnosis not present

## 2015-01-18 DIAGNOSIS — H409 Unspecified glaucoma: Secondary | ICD-10-CM | POA: Diagnosis not present

## 2015-01-18 DIAGNOSIS — G4700 Insomnia, unspecified: Secondary | ICD-10-CM | POA: Diagnosis not present

## 2015-01-19 DIAGNOSIS — H409 Unspecified glaucoma: Secondary | ICD-10-CM | POA: Diagnosis not present

## 2015-01-19 DIAGNOSIS — G4700 Insomnia, unspecified: Secondary | ICD-10-CM | POA: Diagnosis not present

## 2015-01-20 DIAGNOSIS — H409 Unspecified glaucoma: Secondary | ICD-10-CM | POA: Diagnosis not present

## 2015-01-20 DIAGNOSIS — G4700 Insomnia, unspecified: Secondary | ICD-10-CM | POA: Diagnosis not present

## 2015-01-21 DIAGNOSIS — H409 Unspecified glaucoma: Secondary | ICD-10-CM | POA: Diagnosis not present

## 2015-01-21 DIAGNOSIS — G4700 Insomnia, unspecified: Secondary | ICD-10-CM | POA: Diagnosis not present

## 2015-01-22 DIAGNOSIS — H409 Unspecified glaucoma: Secondary | ICD-10-CM | POA: Diagnosis not present

## 2015-01-22 DIAGNOSIS — G4700 Insomnia, unspecified: Secondary | ICD-10-CM | POA: Diagnosis not present

## 2015-01-23 DIAGNOSIS — H409 Unspecified glaucoma: Secondary | ICD-10-CM | POA: Diagnosis not present

## 2015-01-23 DIAGNOSIS — G4700 Insomnia, unspecified: Secondary | ICD-10-CM | POA: Diagnosis not present

## 2015-01-24 DIAGNOSIS — H409 Unspecified glaucoma: Secondary | ICD-10-CM | POA: Diagnosis not present

## 2015-01-24 DIAGNOSIS — G4700 Insomnia, unspecified: Secondary | ICD-10-CM | POA: Diagnosis not present

## 2015-01-25 DIAGNOSIS — G4700 Insomnia, unspecified: Secondary | ICD-10-CM | POA: Diagnosis not present

## 2015-01-25 DIAGNOSIS — H409 Unspecified glaucoma: Secondary | ICD-10-CM | POA: Diagnosis not present

## 2015-01-26 DIAGNOSIS — G4700 Insomnia, unspecified: Secondary | ICD-10-CM | POA: Diagnosis not present

## 2015-01-26 DIAGNOSIS — H409 Unspecified glaucoma: Secondary | ICD-10-CM | POA: Diagnosis not present

## 2015-01-27 DIAGNOSIS — H409 Unspecified glaucoma: Secondary | ICD-10-CM | POA: Diagnosis not present

## 2015-01-27 DIAGNOSIS — G4700 Insomnia, unspecified: Secondary | ICD-10-CM | POA: Diagnosis not present

## 2015-01-28 DIAGNOSIS — H409 Unspecified glaucoma: Secondary | ICD-10-CM | POA: Diagnosis not present

## 2015-01-28 DIAGNOSIS — G4700 Insomnia, unspecified: Secondary | ICD-10-CM | POA: Diagnosis not present

## 2015-01-29 DIAGNOSIS — H409 Unspecified glaucoma: Secondary | ICD-10-CM | POA: Diagnosis not present

## 2015-01-29 DIAGNOSIS — G4700 Insomnia, unspecified: Secondary | ICD-10-CM | POA: Diagnosis not present

## 2015-01-30 DIAGNOSIS — H409 Unspecified glaucoma: Secondary | ICD-10-CM | POA: Diagnosis not present

## 2015-01-30 DIAGNOSIS — G4700 Insomnia, unspecified: Secondary | ICD-10-CM | POA: Diagnosis not present

## 2015-01-30 DIAGNOSIS — B9681 Helicobacter pylori [H. pylori] as the cause of diseases classified elsewhere: Secondary | ICD-10-CM | POA: Diagnosis not present

## 2015-01-30 DIAGNOSIS — J449 Chronic obstructive pulmonary disease, unspecified: Secondary | ICD-10-CM | POA: Diagnosis not present

## 2015-01-31 DIAGNOSIS — G4700 Insomnia, unspecified: Secondary | ICD-10-CM | POA: Diagnosis not present

## 2015-01-31 DIAGNOSIS — H409 Unspecified glaucoma: Secondary | ICD-10-CM | POA: Diagnosis not present

## 2015-02-01 DIAGNOSIS — H409 Unspecified glaucoma: Secondary | ICD-10-CM | POA: Diagnosis not present

## 2015-02-01 DIAGNOSIS — G4700 Insomnia, unspecified: Secondary | ICD-10-CM | POA: Diagnosis not present

## 2015-02-02 DIAGNOSIS — H409 Unspecified glaucoma: Secondary | ICD-10-CM | POA: Diagnosis not present

## 2015-02-02 DIAGNOSIS — G4700 Insomnia, unspecified: Secondary | ICD-10-CM | POA: Diagnosis not present

## 2015-02-03 DIAGNOSIS — H409 Unspecified glaucoma: Secondary | ICD-10-CM | POA: Diagnosis not present

## 2015-02-03 DIAGNOSIS — G4700 Insomnia, unspecified: Secondary | ICD-10-CM | POA: Diagnosis not present

## 2015-02-04 DIAGNOSIS — G4700 Insomnia, unspecified: Secondary | ICD-10-CM | POA: Diagnosis not present

## 2015-02-04 DIAGNOSIS — H409 Unspecified glaucoma: Secondary | ICD-10-CM | POA: Diagnosis not present

## 2015-02-05 DIAGNOSIS — H409 Unspecified glaucoma: Secondary | ICD-10-CM | POA: Diagnosis not present

## 2015-02-05 DIAGNOSIS — G4700 Insomnia, unspecified: Secondary | ICD-10-CM | POA: Diagnosis not present

## 2015-02-06 DIAGNOSIS — G4700 Insomnia, unspecified: Secondary | ICD-10-CM | POA: Diagnosis not present

## 2015-02-06 DIAGNOSIS — H409 Unspecified glaucoma: Secondary | ICD-10-CM | POA: Diagnosis not present

## 2015-02-07 DIAGNOSIS — H409 Unspecified glaucoma: Secondary | ICD-10-CM | POA: Diagnosis not present

## 2015-02-07 DIAGNOSIS — G4700 Insomnia, unspecified: Secondary | ICD-10-CM | POA: Diagnosis not present

## 2015-02-07 DIAGNOSIS — M21371 Foot drop, right foot: Secondary | ICD-10-CM | POA: Diagnosis not present

## 2015-02-08 DIAGNOSIS — G4700 Insomnia, unspecified: Secondary | ICD-10-CM | POA: Diagnosis not present

## 2015-02-08 DIAGNOSIS — H409 Unspecified glaucoma: Secondary | ICD-10-CM | POA: Diagnosis not present

## 2015-02-09 DIAGNOSIS — H409 Unspecified glaucoma: Secondary | ICD-10-CM | POA: Diagnosis not present

## 2015-02-09 DIAGNOSIS — G4700 Insomnia, unspecified: Secondary | ICD-10-CM | POA: Diagnosis not present

## 2015-02-10 DIAGNOSIS — H409 Unspecified glaucoma: Secondary | ICD-10-CM | POA: Diagnosis not present

## 2015-02-10 DIAGNOSIS — G4700 Insomnia, unspecified: Secondary | ICD-10-CM | POA: Diagnosis not present

## 2015-02-11 DIAGNOSIS — G4700 Insomnia, unspecified: Secondary | ICD-10-CM | POA: Diagnosis not present

## 2015-02-11 DIAGNOSIS — H409 Unspecified glaucoma: Secondary | ICD-10-CM | POA: Diagnosis not present

## 2015-02-12 DIAGNOSIS — G4700 Insomnia, unspecified: Secondary | ICD-10-CM | POA: Diagnosis not present

## 2015-02-12 DIAGNOSIS — H409 Unspecified glaucoma: Secondary | ICD-10-CM | POA: Diagnosis not present

## 2015-02-13 DIAGNOSIS — G4700 Insomnia, unspecified: Secondary | ICD-10-CM | POA: Diagnosis not present

## 2015-02-13 DIAGNOSIS — H409 Unspecified glaucoma: Secondary | ICD-10-CM | POA: Diagnosis not present

## 2015-02-14 DIAGNOSIS — H409 Unspecified glaucoma: Secondary | ICD-10-CM | POA: Diagnosis not present

## 2015-02-14 DIAGNOSIS — G4700 Insomnia, unspecified: Secondary | ICD-10-CM | POA: Diagnosis not present

## 2015-02-15 DIAGNOSIS — G4700 Insomnia, unspecified: Secondary | ICD-10-CM | POA: Diagnosis not present

## 2015-02-15 DIAGNOSIS — H409 Unspecified glaucoma: Secondary | ICD-10-CM | POA: Diagnosis not present

## 2015-02-16 DIAGNOSIS — G4700 Insomnia, unspecified: Secondary | ICD-10-CM | POA: Diagnosis not present

## 2015-02-16 DIAGNOSIS — H409 Unspecified glaucoma: Secondary | ICD-10-CM | POA: Diagnosis not present

## 2015-02-17 DIAGNOSIS — H409 Unspecified glaucoma: Secondary | ICD-10-CM | POA: Diagnosis not present

## 2015-02-17 DIAGNOSIS — G4700 Insomnia, unspecified: Secondary | ICD-10-CM | POA: Diagnosis not present

## 2015-02-18 DIAGNOSIS — H409 Unspecified glaucoma: Secondary | ICD-10-CM | POA: Diagnosis not present

## 2015-02-18 DIAGNOSIS — G4700 Insomnia, unspecified: Secondary | ICD-10-CM | POA: Diagnosis not present

## 2015-02-19 DIAGNOSIS — H409 Unspecified glaucoma: Secondary | ICD-10-CM | POA: Diagnosis not present

## 2015-02-19 DIAGNOSIS — G4700 Insomnia, unspecified: Secondary | ICD-10-CM | POA: Diagnosis not present

## 2015-02-20 DIAGNOSIS — H409 Unspecified glaucoma: Secondary | ICD-10-CM | POA: Diagnosis not present

## 2015-02-20 DIAGNOSIS — G4700 Insomnia, unspecified: Secondary | ICD-10-CM | POA: Diagnosis not present

## 2015-02-21 DIAGNOSIS — G4700 Insomnia, unspecified: Secondary | ICD-10-CM | POA: Diagnosis not present

## 2015-02-21 DIAGNOSIS — H409 Unspecified glaucoma: Secondary | ICD-10-CM | POA: Diagnosis not present

## 2015-02-22 DIAGNOSIS — G4700 Insomnia, unspecified: Secondary | ICD-10-CM | POA: Diagnosis not present

## 2015-02-22 DIAGNOSIS — H409 Unspecified glaucoma: Secondary | ICD-10-CM | POA: Diagnosis not present

## 2015-02-23 DIAGNOSIS — H409 Unspecified glaucoma: Secondary | ICD-10-CM | POA: Diagnosis not present

## 2015-02-23 DIAGNOSIS — G4700 Insomnia, unspecified: Secondary | ICD-10-CM | POA: Diagnosis not present

## 2015-02-24 DIAGNOSIS — G4700 Insomnia, unspecified: Secondary | ICD-10-CM | POA: Diagnosis not present

## 2015-02-24 DIAGNOSIS — H409 Unspecified glaucoma: Secondary | ICD-10-CM | POA: Diagnosis not present

## 2015-02-25 DIAGNOSIS — H409 Unspecified glaucoma: Secondary | ICD-10-CM | POA: Diagnosis not present

## 2015-02-25 DIAGNOSIS — G4700 Insomnia, unspecified: Secondary | ICD-10-CM | POA: Diagnosis not present

## 2015-02-26 DIAGNOSIS — H409 Unspecified glaucoma: Secondary | ICD-10-CM | POA: Diagnosis not present

## 2015-02-26 DIAGNOSIS — G4700 Insomnia, unspecified: Secondary | ICD-10-CM | POA: Diagnosis not present

## 2015-02-27 DIAGNOSIS — H409 Unspecified glaucoma: Secondary | ICD-10-CM | POA: Diagnosis not present

## 2015-02-27 DIAGNOSIS — G4700 Insomnia, unspecified: Secondary | ICD-10-CM | POA: Diagnosis not present

## 2015-02-28 DIAGNOSIS — G4700 Insomnia, unspecified: Secondary | ICD-10-CM | POA: Diagnosis not present

## 2015-02-28 DIAGNOSIS — H409 Unspecified glaucoma: Secondary | ICD-10-CM | POA: Diagnosis not present

## 2015-03-01 DIAGNOSIS — G4700 Insomnia, unspecified: Secondary | ICD-10-CM | POA: Diagnosis not present

## 2015-03-01 DIAGNOSIS — H409 Unspecified glaucoma: Secondary | ICD-10-CM | POA: Diagnosis not present

## 2015-03-02 DIAGNOSIS — G4700 Insomnia, unspecified: Secondary | ICD-10-CM | POA: Diagnosis not present

## 2015-03-02 DIAGNOSIS — H409 Unspecified glaucoma: Secondary | ICD-10-CM | POA: Diagnosis not present

## 2015-03-03 DIAGNOSIS — G4700 Insomnia, unspecified: Secondary | ICD-10-CM | POA: Diagnosis not present

## 2015-03-03 DIAGNOSIS — H409 Unspecified glaucoma: Secondary | ICD-10-CM | POA: Diagnosis not present

## 2015-03-04 DIAGNOSIS — H409 Unspecified glaucoma: Secondary | ICD-10-CM | POA: Diagnosis not present

## 2015-03-04 DIAGNOSIS — G4700 Insomnia, unspecified: Secondary | ICD-10-CM | POA: Diagnosis not present

## 2015-03-05 DIAGNOSIS — G4700 Insomnia, unspecified: Secondary | ICD-10-CM | POA: Diagnosis not present

## 2015-03-05 DIAGNOSIS — H409 Unspecified glaucoma: Secondary | ICD-10-CM | POA: Diagnosis not present

## 2015-03-06 DIAGNOSIS — H409 Unspecified glaucoma: Secondary | ICD-10-CM | POA: Diagnosis not present

## 2015-03-06 DIAGNOSIS — G4700 Insomnia, unspecified: Secondary | ICD-10-CM | POA: Diagnosis not present

## 2015-03-07 DIAGNOSIS — H409 Unspecified glaucoma: Secondary | ICD-10-CM | POA: Diagnosis not present

## 2015-03-07 DIAGNOSIS — G4700 Insomnia, unspecified: Secondary | ICD-10-CM | POA: Diagnosis not present

## 2015-03-08 DIAGNOSIS — G4700 Insomnia, unspecified: Secondary | ICD-10-CM | POA: Diagnosis not present

## 2015-03-08 DIAGNOSIS — H409 Unspecified glaucoma: Secondary | ICD-10-CM | POA: Diagnosis not present

## 2015-03-09 DIAGNOSIS — G4700 Insomnia, unspecified: Secondary | ICD-10-CM | POA: Diagnosis not present

## 2015-03-09 DIAGNOSIS — H409 Unspecified glaucoma: Secondary | ICD-10-CM | POA: Diagnosis not present

## 2015-03-10 DIAGNOSIS — G4700 Insomnia, unspecified: Secondary | ICD-10-CM | POA: Diagnosis not present

## 2015-03-10 DIAGNOSIS — H409 Unspecified glaucoma: Secondary | ICD-10-CM | POA: Diagnosis not present

## 2015-03-11 DIAGNOSIS — G4700 Insomnia, unspecified: Secondary | ICD-10-CM | POA: Diagnosis not present

## 2015-03-11 DIAGNOSIS — H409 Unspecified glaucoma: Secondary | ICD-10-CM | POA: Diagnosis not present

## 2015-03-12 DIAGNOSIS — E559 Vitamin D deficiency, unspecified: Secondary | ICD-10-CM | POA: Diagnosis not present

## 2015-03-12 DIAGNOSIS — K3184 Gastroparesis: Secondary | ICD-10-CM | POA: Diagnosis not present

## 2015-03-12 DIAGNOSIS — E108 Type 1 diabetes mellitus with unspecified complications: Secondary | ICD-10-CM | POA: Diagnosis not present

## 2015-03-12 DIAGNOSIS — I10 Essential (primary) hypertension: Secondary | ICD-10-CM | POA: Diagnosis not present

## 2015-03-12 DIAGNOSIS — F064 Anxiety disorder due to known physiological condition: Secondary | ICD-10-CM | POA: Diagnosis not present

## 2015-03-12 DIAGNOSIS — H409 Unspecified glaucoma: Secondary | ICD-10-CM | POA: Diagnosis not present

## 2015-03-12 DIAGNOSIS — G4700 Insomnia, unspecified: Secondary | ICD-10-CM | POA: Diagnosis not present

## 2015-03-13 DIAGNOSIS — H409 Unspecified glaucoma: Secondary | ICD-10-CM | POA: Diagnosis not present

## 2015-03-13 DIAGNOSIS — G4700 Insomnia, unspecified: Secondary | ICD-10-CM | POA: Diagnosis not present

## 2015-03-14 DIAGNOSIS — H409 Unspecified glaucoma: Secondary | ICD-10-CM | POA: Diagnosis not present

## 2015-03-14 DIAGNOSIS — G4700 Insomnia, unspecified: Secondary | ICD-10-CM | POA: Diagnosis not present

## 2015-03-15 DIAGNOSIS — G4700 Insomnia, unspecified: Secondary | ICD-10-CM | POA: Diagnosis not present

## 2015-03-15 DIAGNOSIS — H409 Unspecified glaucoma: Secondary | ICD-10-CM | POA: Diagnosis not present

## 2015-03-16 DIAGNOSIS — H409 Unspecified glaucoma: Secondary | ICD-10-CM | POA: Diagnosis not present

## 2015-03-16 DIAGNOSIS — G4700 Insomnia, unspecified: Secondary | ICD-10-CM | POA: Diagnosis not present

## 2015-03-17 DIAGNOSIS — G4700 Insomnia, unspecified: Secondary | ICD-10-CM | POA: Diagnosis not present

## 2015-03-17 DIAGNOSIS — H409 Unspecified glaucoma: Secondary | ICD-10-CM | POA: Diagnosis not present

## 2015-03-18 DIAGNOSIS — G4700 Insomnia, unspecified: Secondary | ICD-10-CM | POA: Diagnosis not present

## 2015-03-18 DIAGNOSIS — H409 Unspecified glaucoma: Secondary | ICD-10-CM | POA: Diagnosis not present

## 2015-03-19 DIAGNOSIS — H409 Unspecified glaucoma: Secondary | ICD-10-CM | POA: Diagnosis not present

## 2015-03-19 DIAGNOSIS — G4700 Insomnia, unspecified: Secondary | ICD-10-CM | POA: Diagnosis not present

## 2015-03-20 DIAGNOSIS — H409 Unspecified glaucoma: Secondary | ICD-10-CM | POA: Diagnosis not present

## 2015-03-20 DIAGNOSIS — G4700 Insomnia, unspecified: Secondary | ICD-10-CM | POA: Diagnosis not present

## 2015-03-21 DIAGNOSIS — G4700 Insomnia, unspecified: Secondary | ICD-10-CM | POA: Diagnosis not present

## 2015-03-21 DIAGNOSIS — H409 Unspecified glaucoma: Secondary | ICD-10-CM | POA: Diagnosis not present

## 2015-03-22 DIAGNOSIS — G4700 Insomnia, unspecified: Secondary | ICD-10-CM | POA: Diagnosis not present

## 2015-03-22 DIAGNOSIS — H409 Unspecified glaucoma: Secondary | ICD-10-CM | POA: Diagnosis not present

## 2015-03-23 DIAGNOSIS — G4700 Insomnia, unspecified: Secondary | ICD-10-CM | POA: Diagnosis not present

## 2015-03-23 DIAGNOSIS — H409 Unspecified glaucoma: Secondary | ICD-10-CM | POA: Diagnosis not present

## 2015-03-24 DIAGNOSIS — G4700 Insomnia, unspecified: Secondary | ICD-10-CM | POA: Diagnosis not present

## 2015-03-24 DIAGNOSIS — H409 Unspecified glaucoma: Secondary | ICD-10-CM | POA: Diagnosis not present

## 2015-03-25 DIAGNOSIS — H409 Unspecified glaucoma: Secondary | ICD-10-CM | POA: Diagnosis not present

## 2015-03-25 DIAGNOSIS — G4700 Insomnia, unspecified: Secondary | ICD-10-CM | POA: Diagnosis not present

## 2015-03-26 DIAGNOSIS — H409 Unspecified glaucoma: Secondary | ICD-10-CM | POA: Diagnosis not present

## 2015-03-26 DIAGNOSIS — G4700 Insomnia, unspecified: Secondary | ICD-10-CM | POA: Diagnosis not present

## 2015-03-27 DIAGNOSIS — G4700 Insomnia, unspecified: Secondary | ICD-10-CM | POA: Diagnosis not present

## 2015-03-27 DIAGNOSIS — H409 Unspecified glaucoma: Secondary | ICD-10-CM | POA: Diagnosis not present

## 2015-03-28 DIAGNOSIS — H409 Unspecified glaucoma: Secondary | ICD-10-CM | POA: Diagnosis not present

## 2015-03-28 DIAGNOSIS — G4700 Insomnia, unspecified: Secondary | ICD-10-CM | POA: Diagnosis not present

## 2015-03-29 DIAGNOSIS — H409 Unspecified glaucoma: Secondary | ICD-10-CM | POA: Diagnosis not present

## 2015-03-29 DIAGNOSIS — G4700 Insomnia, unspecified: Secondary | ICD-10-CM | POA: Diagnosis not present

## 2015-03-30 DIAGNOSIS — H409 Unspecified glaucoma: Secondary | ICD-10-CM | POA: Diagnosis not present

## 2015-03-30 DIAGNOSIS — G4700 Insomnia, unspecified: Secondary | ICD-10-CM | POA: Diagnosis not present

## 2015-03-31 DIAGNOSIS — G4700 Insomnia, unspecified: Secondary | ICD-10-CM | POA: Diagnosis not present

## 2015-03-31 DIAGNOSIS — H409 Unspecified glaucoma: Secondary | ICD-10-CM | POA: Diagnosis not present

## 2015-04-01 DIAGNOSIS — G4700 Insomnia, unspecified: Secondary | ICD-10-CM | POA: Diagnosis not present

## 2015-04-01 DIAGNOSIS — H409 Unspecified glaucoma: Secondary | ICD-10-CM | POA: Diagnosis not present

## 2015-04-02 DIAGNOSIS — H409 Unspecified glaucoma: Secondary | ICD-10-CM | POA: Diagnosis not present

## 2015-04-02 DIAGNOSIS — G4700 Insomnia, unspecified: Secondary | ICD-10-CM | POA: Diagnosis not present

## 2015-04-03 DIAGNOSIS — H409 Unspecified glaucoma: Secondary | ICD-10-CM | POA: Diagnosis not present

## 2015-04-03 DIAGNOSIS — G4700 Insomnia, unspecified: Secondary | ICD-10-CM | POA: Diagnosis not present

## 2015-04-04 DIAGNOSIS — H409 Unspecified glaucoma: Secondary | ICD-10-CM | POA: Diagnosis not present

## 2015-04-04 DIAGNOSIS — E1140 Type 2 diabetes mellitus with diabetic neuropathy, unspecified: Secondary | ICD-10-CM | POA: Diagnosis not present

## 2015-04-04 DIAGNOSIS — R801 Persistent proteinuria, unspecified: Secondary | ICD-10-CM | POA: Diagnosis not present

## 2015-04-04 DIAGNOSIS — I129 Hypertensive chronic kidney disease with stage 1 through stage 4 chronic kidney disease, or unspecified chronic kidney disease: Secondary | ICD-10-CM | POA: Diagnosis not present

## 2015-04-04 DIAGNOSIS — N183 Chronic kidney disease, stage 3 (moderate): Secondary | ICD-10-CM | POA: Diagnosis not present

## 2015-04-04 DIAGNOSIS — E1029 Type 1 diabetes mellitus with other diabetic kidney complication: Secondary | ICD-10-CM | POA: Diagnosis not present

## 2015-04-04 DIAGNOSIS — G4700 Insomnia, unspecified: Secondary | ICD-10-CM | POA: Diagnosis not present

## 2015-04-05 DIAGNOSIS — G4700 Insomnia, unspecified: Secondary | ICD-10-CM | POA: Diagnosis not present

## 2015-04-05 DIAGNOSIS — H409 Unspecified glaucoma: Secondary | ICD-10-CM | POA: Diagnosis not present

## 2015-04-06 DIAGNOSIS — G4700 Insomnia, unspecified: Secondary | ICD-10-CM | POA: Diagnosis not present

## 2015-04-06 DIAGNOSIS — H409 Unspecified glaucoma: Secondary | ICD-10-CM | POA: Diagnosis not present

## 2015-04-07 ENCOUNTER — Other Ambulatory Visit: Payer: Self-pay | Admitting: Nephrology

## 2015-04-07 DIAGNOSIS — N189 Chronic kidney disease, unspecified: Secondary | ICD-10-CM

## 2015-04-07 DIAGNOSIS — H409 Unspecified glaucoma: Secondary | ICD-10-CM | POA: Diagnosis not present

## 2015-04-07 DIAGNOSIS — I10 Essential (primary) hypertension: Secondary | ICD-10-CM

## 2015-04-07 DIAGNOSIS — G4700 Insomnia, unspecified: Secondary | ICD-10-CM | POA: Diagnosis not present

## 2015-04-08 DIAGNOSIS — E108 Type 1 diabetes mellitus with unspecified complications: Secondary | ICD-10-CM | POA: Diagnosis not present

## 2015-04-08 DIAGNOSIS — G4700 Insomnia, unspecified: Secondary | ICD-10-CM | POA: Diagnosis not present

## 2015-04-08 DIAGNOSIS — I10 Essential (primary) hypertension: Secondary | ICD-10-CM | POA: Diagnosis not present

## 2015-04-08 DIAGNOSIS — K3184 Gastroparesis: Secondary | ICD-10-CM | POA: Diagnosis not present

## 2015-04-08 DIAGNOSIS — H409 Unspecified glaucoma: Secondary | ICD-10-CM | POA: Diagnosis not present

## 2015-04-08 DIAGNOSIS — E559 Vitamin D deficiency, unspecified: Secondary | ICD-10-CM | POA: Diagnosis not present

## 2015-04-08 DIAGNOSIS — F064 Anxiety disorder due to known physiological condition: Secondary | ICD-10-CM | POA: Diagnosis not present

## 2015-04-09 ENCOUNTER — Ambulatory Visit (HOSPITAL_COMMUNITY): Payer: Medicare Other | Attending: Cardiology

## 2015-04-09 DIAGNOSIS — H409 Unspecified glaucoma: Secondary | ICD-10-CM | POA: Diagnosis not present

## 2015-04-09 DIAGNOSIS — E785 Hyperlipidemia, unspecified: Secondary | ICD-10-CM | POA: Diagnosis not present

## 2015-04-09 DIAGNOSIS — I10 Essential (primary) hypertension: Secondary | ICD-10-CM

## 2015-04-09 DIAGNOSIS — N189 Chronic kidney disease, unspecified: Secondary | ICD-10-CM | POA: Insufficient documentation

## 2015-04-09 DIAGNOSIS — I129 Hypertensive chronic kidney disease with stage 1 through stage 4 chronic kidney disease, or unspecified chronic kidney disease: Secondary | ICD-10-CM | POA: Diagnosis not present

## 2015-04-09 DIAGNOSIS — E1122 Type 2 diabetes mellitus with diabetic chronic kidney disease: Secondary | ICD-10-CM | POA: Diagnosis not present

## 2015-04-09 DIAGNOSIS — G4700 Insomnia, unspecified: Secondary | ICD-10-CM | POA: Diagnosis not present

## 2015-04-10 DIAGNOSIS — H409 Unspecified glaucoma: Secondary | ICD-10-CM | POA: Diagnosis not present

## 2015-04-10 DIAGNOSIS — G4700 Insomnia, unspecified: Secondary | ICD-10-CM | POA: Diagnosis not present

## 2015-04-11 DIAGNOSIS — H409 Unspecified glaucoma: Secondary | ICD-10-CM | POA: Diagnosis not present

## 2015-04-11 DIAGNOSIS — G4700 Insomnia, unspecified: Secondary | ICD-10-CM | POA: Diagnosis not present

## 2015-04-12 DIAGNOSIS — G4700 Insomnia, unspecified: Secondary | ICD-10-CM | POA: Diagnosis not present

## 2015-04-12 DIAGNOSIS — H409 Unspecified glaucoma: Secondary | ICD-10-CM | POA: Diagnosis not present

## 2015-04-13 DIAGNOSIS — G4700 Insomnia, unspecified: Secondary | ICD-10-CM | POA: Diagnosis not present

## 2015-04-13 DIAGNOSIS — H409 Unspecified glaucoma: Secondary | ICD-10-CM | POA: Diagnosis not present

## 2015-04-14 DIAGNOSIS — H409 Unspecified glaucoma: Secondary | ICD-10-CM | POA: Diagnosis not present

## 2015-04-14 DIAGNOSIS — G4700 Insomnia, unspecified: Secondary | ICD-10-CM | POA: Diagnosis not present

## 2015-04-15 DIAGNOSIS — H409 Unspecified glaucoma: Secondary | ICD-10-CM | POA: Diagnosis not present

## 2015-04-15 DIAGNOSIS — E108 Type 1 diabetes mellitus with unspecified complications: Secondary | ICD-10-CM | POA: Diagnosis not present

## 2015-04-15 DIAGNOSIS — G4700 Insomnia, unspecified: Secondary | ICD-10-CM | POA: Diagnosis not present

## 2015-04-15 DIAGNOSIS — I10 Essential (primary) hypertension: Secondary | ICD-10-CM | POA: Diagnosis not present

## 2015-04-15 DIAGNOSIS — E559 Vitamin D deficiency, unspecified: Secondary | ICD-10-CM | POA: Diagnosis not present

## 2015-04-15 DIAGNOSIS — K3184 Gastroparesis: Secondary | ICD-10-CM | POA: Diagnosis not present

## 2015-04-15 DIAGNOSIS — F064 Anxiety disorder due to known physiological condition: Secondary | ICD-10-CM | POA: Diagnosis not present

## 2015-04-16 DIAGNOSIS — G4700 Insomnia, unspecified: Secondary | ICD-10-CM | POA: Diagnosis not present

## 2015-04-16 DIAGNOSIS — H409 Unspecified glaucoma: Secondary | ICD-10-CM | POA: Diagnosis not present

## 2015-04-17 DIAGNOSIS — H409 Unspecified glaucoma: Secondary | ICD-10-CM | POA: Diagnosis not present

## 2015-04-17 DIAGNOSIS — G4700 Insomnia, unspecified: Secondary | ICD-10-CM | POA: Diagnosis not present

## 2015-04-18 DIAGNOSIS — G4700 Insomnia, unspecified: Secondary | ICD-10-CM | POA: Diagnosis not present

## 2015-04-18 DIAGNOSIS — H409 Unspecified glaucoma: Secondary | ICD-10-CM | POA: Diagnosis not present

## 2015-04-19 DIAGNOSIS — G4700 Insomnia, unspecified: Secondary | ICD-10-CM | POA: Diagnosis not present

## 2015-04-19 DIAGNOSIS — H409 Unspecified glaucoma: Secondary | ICD-10-CM | POA: Diagnosis not present

## 2015-04-20 DIAGNOSIS — H409 Unspecified glaucoma: Secondary | ICD-10-CM | POA: Diagnosis not present

## 2015-04-20 DIAGNOSIS — G4700 Insomnia, unspecified: Secondary | ICD-10-CM | POA: Diagnosis not present

## 2015-04-21 DIAGNOSIS — H409 Unspecified glaucoma: Secondary | ICD-10-CM | POA: Diagnosis not present

## 2015-04-21 DIAGNOSIS — G4700 Insomnia, unspecified: Secondary | ICD-10-CM | POA: Diagnosis not present

## 2015-04-22 DIAGNOSIS — H409 Unspecified glaucoma: Secondary | ICD-10-CM | POA: Diagnosis not present

## 2015-04-22 DIAGNOSIS — G4700 Insomnia, unspecified: Secondary | ICD-10-CM | POA: Diagnosis not present

## 2015-04-23 DIAGNOSIS — G4700 Insomnia, unspecified: Secondary | ICD-10-CM | POA: Diagnosis not present

## 2015-04-23 DIAGNOSIS — H409 Unspecified glaucoma: Secondary | ICD-10-CM | POA: Diagnosis not present

## 2015-04-24 DIAGNOSIS — H409 Unspecified glaucoma: Secondary | ICD-10-CM | POA: Diagnosis not present

## 2015-04-24 DIAGNOSIS — G4700 Insomnia, unspecified: Secondary | ICD-10-CM | POA: Diagnosis not present

## 2015-04-25 DIAGNOSIS — G4700 Insomnia, unspecified: Secondary | ICD-10-CM | POA: Diagnosis not present

## 2015-04-25 DIAGNOSIS — H409 Unspecified glaucoma: Secondary | ICD-10-CM | POA: Diagnosis not present

## 2015-04-26 DIAGNOSIS — G4700 Insomnia, unspecified: Secondary | ICD-10-CM | POA: Diagnosis not present

## 2015-04-26 DIAGNOSIS — H409 Unspecified glaucoma: Secondary | ICD-10-CM | POA: Diagnosis not present

## 2015-04-27 DIAGNOSIS — H409 Unspecified glaucoma: Secondary | ICD-10-CM | POA: Diagnosis not present

## 2015-04-27 DIAGNOSIS — G4700 Insomnia, unspecified: Secondary | ICD-10-CM | POA: Diagnosis not present

## 2015-04-29 DIAGNOSIS — G4700 Insomnia, unspecified: Secondary | ICD-10-CM | POA: Diagnosis not present

## 2015-04-29 DIAGNOSIS — H409 Unspecified glaucoma: Secondary | ICD-10-CM | POA: Diagnosis not present

## 2015-04-30 DIAGNOSIS — G4700 Insomnia, unspecified: Secondary | ICD-10-CM | POA: Diagnosis not present

## 2015-04-30 DIAGNOSIS — H409 Unspecified glaucoma: Secondary | ICD-10-CM | POA: Diagnosis not present

## 2015-05-01 DIAGNOSIS — H409 Unspecified glaucoma: Secondary | ICD-10-CM | POA: Diagnosis not present

## 2015-05-01 DIAGNOSIS — G4700 Insomnia, unspecified: Secondary | ICD-10-CM | POA: Diagnosis not present

## 2015-05-02 DIAGNOSIS — G4700 Insomnia, unspecified: Secondary | ICD-10-CM | POA: Diagnosis not present

## 2015-05-02 DIAGNOSIS — H409 Unspecified glaucoma: Secondary | ICD-10-CM | POA: Diagnosis not present

## 2015-05-03 DIAGNOSIS — G4700 Insomnia, unspecified: Secondary | ICD-10-CM | POA: Diagnosis not present

## 2015-05-03 DIAGNOSIS — H409 Unspecified glaucoma: Secondary | ICD-10-CM | POA: Diagnosis not present

## 2015-05-04 DIAGNOSIS — H409 Unspecified glaucoma: Secondary | ICD-10-CM | POA: Diagnosis not present

## 2015-05-04 DIAGNOSIS — G4700 Insomnia, unspecified: Secondary | ICD-10-CM | POA: Diagnosis not present

## 2015-05-05 DIAGNOSIS — G4700 Insomnia, unspecified: Secondary | ICD-10-CM | POA: Diagnosis not present

## 2015-05-05 DIAGNOSIS — H409 Unspecified glaucoma: Secondary | ICD-10-CM | POA: Diagnosis not present

## 2015-05-06 DIAGNOSIS — H409 Unspecified glaucoma: Secondary | ICD-10-CM | POA: Diagnosis not present

## 2015-05-06 DIAGNOSIS — G4700 Insomnia, unspecified: Secondary | ICD-10-CM | POA: Diagnosis not present

## 2015-05-07 DIAGNOSIS — H409 Unspecified glaucoma: Secondary | ICD-10-CM | POA: Diagnosis not present

## 2015-05-07 DIAGNOSIS — G4700 Insomnia, unspecified: Secondary | ICD-10-CM | POA: Diagnosis not present

## 2015-05-08 DIAGNOSIS — G4700 Insomnia, unspecified: Secondary | ICD-10-CM | POA: Diagnosis not present

## 2015-05-08 DIAGNOSIS — H409 Unspecified glaucoma: Secondary | ICD-10-CM | POA: Diagnosis not present

## 2015-05-09 DIAGNOSIS — H409 Unspecified glaucoma: Secondary | ICD-10-CM | POA: Diagnosis not present

## 2015-05-09 DIAGNOSIS — G4700 Insomnia, unspecified: Secondary | ICD-10-CM | POA: Diagnosis not present

## 2015-05-10 DIAGNOSIS — H409 Unspecified glaucoma: Secondary | ICD-10-CM | POA: Diagnosis not present

## 2015-05-10 DIAGNOSIS — G4700 Insomnia, unspecified: Secondary | ICD-10-CM | POA: Diagnosis not present

## 2015-05-11 DIAGNOSIS — H409 Unspecified glaucoma: Secondary | ICD-10-CM | POA: Diagnosis not present

## 2015-05-11 DIAGNOSIS — G4700 Insomnia, unspecified: Secondary | ICD-10-CM | POA: Diagnosis not present

## 2015-05-12 DIAGNOSIS — H409 Unspecified glaucoma: Secondary | ICD-10-CM | POA: Diagnosis not present

## 2015-05-12 DIAGNOSIS — G4700 Insomnia, unspecified: Secondary | ICD-10-CM | POA: Diagnosis not present

## 2015-05-13 DIAGNOSIS — H409 Unspecified glaucoma: Secondary | ICD-10-CM | POA: Diagnosis not present

## 2015-05-13 DIAGNOSIS — G4700 Insomnia, unspecified: Secondary | ICD-10-CM | POA: Diagnosis not present

## 2015-05-14 DIAGNOSIS — H409 Unspecified glaucoma: Secondary | ICD-10-CM | POA: Diagnosis not present

## 2015-05-14 DIAGNOSIS — G4700 Insomnia, unspecified: Secondary | ICD-10-CM | POA: Diagnosis not present

## 2015-05-15 DIAGNOSIS — H409 Unspecified glaucoma: Secondary | ICD-10-CM | POA: Diagnosis not present

## 2015-05-15 DIAGNOSIS — I129 Hypertensive chronic kidney disease with stage 1 through stage 4 chronic kidney disease, or unspecified chronic kidney disease: Secondary | ICD-10-CM | POA: Diagnosis not present

## 2015-05-15 DIAGNOSIS — N183 Chronic kidney disease, stage 3 (moderate): Secondary | ICD-10-CM | POA: Diagnosis not present

## 2015-05-15 DIAGNOSIS — G4700 Insomnia, unspecified: Secondary | ICD-10-CM | POA: Diagnosis not present

## 2015-05-16 DIAGNOSIS — H409 Unspecified glaucoma: Secondary | ICD-10-CM | POA: Diagnosis not present

## 2015-05-16 DIAGNOSIS — G4700 Insomnia, unspecified: Secondary | ICD-10-CM | POA: Diagnosis not present

## 2015-05-17 DIAGNOSIS — G4700 Insomnia, unspecified: Secondary | ICD-10-CM | POA: Diagnosis not present

## 2015-05-17 DIAGNOSIS — H409 Unspecified glaucoma: Secondary | ICD-10-CM | POA: Diagnosis not present

## 2015-05-18 DIAGNOSIS — H409 Unspecified glaucoma: Secondary | ICD-10-CM | POA: Diagnosis not present

## 2015-05-18 DIAGNOSIS — G4700 Insomnia, unspecified: Secondary | ICD-10-CM | POA: Diagnosis not present

## 2015-05-19 DIAGNOSIS — G4700 Insomnia, unspecified: Secondary | ICD-10-CM | POA: Diagnosis not present

## 2015-05-19 DIAGNOSIS — H409 Unspecified glaucoma: Secondary | ICD-10-CM | POA: Diagnosis not present

## 2015-05-20 DIAGNOSIS — H409 Unspecified glaucoma: Secondary | ICD-10-CM | POA: Diagnosis not present

## 2015-05-20 DIAGNOSIS — G4700 Insomnia, unspecified: Secondary | ICD-10-CM | POA: Diagnosis not present

## 2015-05-21 DIAGNOSIS — H409 Unspecified glaucoma: Secondary | ICD-10-CM | POA: Diagnosis not present

## 2015-05-21 DIAGNOSIS — G4700 Insomnia, unspecified: Secondary | ICD-10-CM | POA: Diagnosis not present

## 2015-05-22 DIAGNOSIS — H409 Unspecified glaucoma: Secondary | ICD-10-CM | POA: Diagnosis not present

## 2015-05-22 DIAGNOSIS — G4700 Insomnia, unspecified: Secondary | ICD-10-CM | POA: Diagnosis not present

## 2015-05-23 DIAGNOSIS — H409 Unspecified glaucoma: Secondary | ICD-10-CM | POA: Diagnosis not present

## 2015-05-23 DIAGNOSIS — G4700 Insomnia, unspecified: Secondary | ICD-10-CM | POA: Diagnosis not present

## 2015-05-24 DIAGNOSIS — H409 Unspecified glaucoma: Secondary | ICD-10-CM | POA: Diagnosis not present

## 2015-05-24 DIAGNOSIS — G4700 Insomnia, unspecified: Secondary | ICD-10-CM | POA: Diagnosis not present

## 2015-05-25 DIAGNOSIS — G4700 Insomnia, unspecified: Secondary | ICD-10-CM | POA: Diagnosis not present

## 2015-05-25 DIAGNOSIS — H409 Unspecified glaucoma: Secondary | ICD-10-CM | POA: Diagnosis not present

## 2015-05-26 DIAGNOSIS — H409 Unspecified glaucoma: Secondary | ICD-10-CM | POA: Diagnosis not present

## 2015-05-26 DIAGNOSIS — G4700 Insomnia, unspecified: Secondary | ICD-10-CM | POA: Diagnosis not present

## 2015-05-27 DIAGNOSIS — H409 Unspecified glaucoma: Secondary | ICD-10-CM | POA: Diagnosis not present

## 2015-05-27 DIAGNOSIS — G4700 Insomnia, unspecified: Secondary | ICD-10-CM | POA: Diagnosis not present

## 2015-05-28 DIAGNOSIS — G4700 Insomnia, unspecified: Secondary | ICD-10-CM | POA: Diagnosis not present

## 2015-05-28 DIAGNOSIS — H409 Unspecified glaucoma: Secondary | ICD-10-CM | POA: Diagnosis not present

## 2015-05-29 DIAGNOSIS — G4700 Insomnia, unspecified: Secondary | ICD-10-CM | POA: Diagnosis not present

## 2015-05-29 DIAGNOSIS — H409 Unspecified glaucoma: Secondary | ICD-10-CM | POA: Diagnosis not present

## 2015-05-30 DIAGNOSIS — G4700 Insomnia, unspecified: Secondary | ICD-10-CM | POA: Diagnosis not present

## 2015-05-30 DIAGNOSIS — H409 Unspecified glaucoma: Secondary | ICD-10-CM | POA: Diagnosis not present

## 2015-05-31 DIAGNOSIS — G4700 Insomnia, unspecified: Secondary | ICD-10-CM | POA: Diagnosis not present

## 2015-05-31 DIAGNOSIS — H409 Unspecified glaucoma: Secondary | ICD-10-CM | POA: Diagnosis not present

## 2015-06-01 DIAGNOSIS — G4700 Insomnia, unspecified: Secondary | ICD-10-CM | POA: Diagnosis not present

## 2015-06-01 DIAGNOSIS — H409 Unspecified glaucoma: Secondary | ICD-10-CM | POA: Diagnosis not present

## 2015-06-02 DIAGNOSIS — H409 Unspecified glaucoma: Secondary | ICD-10-CM | POA: Diagnosis not present

## 2015-06-02 DIAGNOSIS — G4700 Insomnia, unspecified: Secondary | ICD-10-CM | POA: Diagnosis not present

## 2015-06-03 DIAGNOSIS — G4700 Insomnia, unspecified: Secondary | ICD-10-CM | POA: Diagnosis not present

## 2015-06-03 DIAGNOSIS — H409 Unspecified glaucoma: Secondary | ICD-10-CM | POA: Diagnosis not present

## 2015-06-04 DIAGNOSIS — H409 Unspecified glaucoma: Secondary | ICD-10-CM | POA: Diagnosis not present

## 2015-06-04 DIAGNOSIS — G4700 Insomnia, unspecified: Secondary | ICD-10-CM | POA: Diagnosis not present

## 2015-06-05 DIAGNOSIS — H409 Unspecified glaucoma: Secondary | ICD-10-CM | POA: Diagnosis not present

## 2015-06-05 DIAGNOSIS — G4700 Insomnia, unspecified: Secondary | ICD-10-CM | POA: Diagnosis not present

## 2015-06-06 DIAGNOSIS — H409 Unspecified glaucoma: Secondary | ICD-10-CM | POA: Diagnosis not present

## 2015-06-06 DIAGNOSIS — G4700 Insomnia, unspecified: Secondary | ICD-10-CM | POA: Diagnosis not present

## 2015-06-07 DIAGNOSIS — G4700 Insomnia, unspecified: Secondary | ICD-10-CM | POA: Diagnosis not present

## 2015-06-07 DIAGNOSIS — H409 Unspecified glaucoma: Secondary | ICD-10-CM | POA: Diagnosis not present

## 2015-06-08 DIAGNOSIS — G4700 Insomnia, unspecified: Secondary | ICD-10-CM | POA: Diagnosis not present

## 2015-06-08 DIAGNOSIS — H409 Unspecified glaucoma: Secondary | ICD-10-CM | POA: Diagnosis not present

## 2015-06-09 DIAGNOSIS — G4700 Insomnia, unspecified: Secondary | ICD-10-CM | POA: Diagnosis not present

## 2015-06-09 DIAGNOSIS — H409 Unspecified glaucoma: Secondary | ICD-10-CM | POA: Diagnosis not present

## 2015-06-10 DIAGNOSIS — H409 Unspecified glaucoma: Secondary | ICD-10-CM | POA: Diagnosis not present

## 2015-06-10 DIAGNOSIS — G4700 Insomnia, unspecified: Secondary | ICD-10-CM | POA: Diagnosis not present

## 2015-06-11 DIAGNOSIS — H409 Unspecified glaucoma: Secondary | ICD-10-CM | POA: Diagnosis not present

## 2015-06-11 DIAGNOSIS — G4700 Insomnia, unspecified: Secondary | ICD-10-CM | POA: Diagnosis not present

## 2015-06-12 DIAGNOSIS — H409 Unspecified glaucoma: Secondary | ICD-10-CM | POA: Diagnosis not present

## 2015-06-12 DIAGNOSIS — G4700 Insomnia, unspecified: Secondary | ICD-10-CM | POA: Diagnosis not present

## 2015-06-13 DIAGNOSIS — H409 Unspecified glaucoma: Secondary | ICD-10-CM | POA: Diagnosis not present

## 2015-06-13 DIAGNOSIS — G4700 Insomnia, unspecified: Secondary | ICD-10-CM | POA: Diagnosis not present

## 2015-06-14 DIAGNOSIS — H409 Unspecified glaucoma: Secondary | ICD-10-CM | POA: Diagnosis not present

## 2015-06-14 DIAGNOSIS — G4700 Insomnia, unspecified: Secondary | ICD-10-CM | POA: Diagnosis not present

## 2015-06-15 DIAGNOSIS — G4700 Insomnia, unspecified: Secondary | ICD-10-CM | POA: Diagnosis not present

## 2015-06-15 DIAGNOSIS — H409 Unspecified glaucoma: Secondary | ICD-10-CM | POA: Diagnosis not present

## 2015-06-16 DIAGNOSIS — G4700 Insomnia, unspecified: Secondary | ICD-10-CM | POA: Diagnosis not present

## 2015-06-16 DIAGNOSIS — H409 Unspecified glaucoma: Secondary | ICD-10-CM | POA: Diagnosis not present

## 2015-06-17 DIAGNOSIS — H409 Unspecified glaucoma: Secondary | ICD-10-CM | POA: Diagnosis not present

## 2015-06-17 DIAGNOSIS — G4700 Insomnia, unspecified: Secondary | ICD-10-CM | POA: Diagnosis not present

## 2015-06-18 DIAGNOSIS — H409 Unspecified glaucoma: Secondary | ICD-10-CM | POA: Diagnosis not present

## 2015-06-18 DIAGNOSIS — G4700 Insomnia, unspecified: Secondary | ICD-10-CM | POA: Diagnosis not present

## 2015-06-19 DIAGNOSIS — G4700 Insomnia, unspecified: Secondary | ICD-10-CM | POA: Diagnosis not present

## 2015-06-19 DIAGNOSIS — H409 Unspecified glaucoma: Secondary | ICD-10-CM | POA: Diagnosis not present

## 2015-06-20 DIAGNOSIS — G4700 Insomnia, unspecified: Secondary | ICD-10-CM | POA: Diagnosis not present

## 2015-06-20 DIAGNOSIS — H409 Unspecified glaucoma: Secondary | ICD-10-CM | POA: Diagnosis not present

## 2015-06-21 DIAGNOSIS — G4700 Insomnia, unspecified: Secondary | ICD-10-CM | POA: Diagnosis not present

## 2015-06-21 DIAGNOSIS — H409 Unspecified glaucoma: Secondary | ICD-10-CM | POA: Diagnosis not present

## 2015-06-22 DIAGNOSIS — G4700 Insomnia, unspecified: Secondary | ICD-10-CM | POA: Diagnosis not present

## 2015-06-22 DIAGNOSIS — H409 Unspecified glaucoma: Secondary | ICD-10-CM | POA: Diagnosis not present

## 2015-07-03 ENCOUNTER — Encounter: Payer: Self-pay | Admitting: Internal Medicine

## 2015-08-28 NOTE — Therapy (Signed)
[facility] [address] [place], [place], [address] Phone: [phone]   Fax:  [phone]  Patient Details  Name: Tammy Moses MRN: [id number] Date of Birth: [birth year] Referring Provider:  No ref. provider found  Encounter Date: 08/28/2015  PHYSICAL THERAPY DISCHARGE SUMMARY  Visits from Start of Care: 3  Current functional level related to goals / functional outcomes:     PT Short Term Goals - 10/08/14 0941    PT SHORT TERM GOAL #1   Title be independent with HEP to address strength, balance.   Baseline No current HEP   Time 4   Period Weeks   Status On-going   PT SHORT TERM GOAL #2   Title improve TUG score to less than or equal to 20 seconds for decreased fall risk.   Baseline TUG score:  25.09 sec (Scores >13.35 sec indicates fall risk.)   Time 4   Period Weeks   Status On-going   PT SHORT TERM GOAL #3   Title improve Berg Balance score by at least 5 points for decreased fall risk.   Baseline Pt able to stand 30 seconds unsupported with increased sway.  Unable to stand unsupported. BERG score on 10/01/14: 25/56.   Time 4   Period Weeks   Status On-going   PT SHORT TERM GOAL #4   Title verbalize at least 3 means to decrease low back pain   Baseline Pt reports pain 7/10, constant, longstanding   Time 4   Period Weeks   Status On-going        Remaining deficits: Unknown, as pt did not return after 3 visits.  Please see pt's eval and 2 treatment notes for details on pt's deficits.   Education / Equipment: HEP  Plan: Patient agrees to discharge.  Patient goals were not met. Patient is being discharged due to meeting the stated rehab goals.  ?????       Reiss Mowrey L 08/28/2015, 3:10 PM  [facility] [address] [place], [place], [address] Phone: [phone]   Fax:  [phone]

## 2015-09-11 ENCOUNTER — Encounter (HOSPITAL_COMMUNITY): Payer: Self-pay | Admitting: Neurology

## 2015-09-11 ENCOUNTER — Emergency Department (HOSPITAL_COMMUNITY)
Admission: EM | Admit: 2015-09-11 | Discharge: 2015-09-11 | Disposition: A | Discharge status: Home / Self Care | Payer: Medicare Other | Attending: Emergency Medicine | Admitting: Emergency Medicine

## 2015-09-11 DIAGNOSIS — K219 Gastro-esophageal reflux disease without esophagitis: Secondary | ICD-10-CM | POA: Insufficient documentation

## 2015-09-11 DIAGNOSIS — H109 Unspecified conjunctivitis: Secondary | ICD-10-CM

## 2015-09-11 DIAGNOSIS — Z794 Long term (current) use of insulin: Secondary | ICD-10-CM | POA: Insufficient documentation

## 2015-09-11 DIAGNOSIS — Z79899 Other long term (current) drug therapy: Secondary | ICD-10-CM | POA: Insufficient documentation

## 2015-09-11 DIAGNOSIS — I10 Essential (primary) hypertension: Secondary | ICD-10-CM | POA: Insufficient documentation

## 2015-09-11 DIAGNOSIS — F17210 Nicotine dependence, cigarettes, uncomplicated: Secondary | ICD-10-CM | POA: Diagnosis not present

## 2015-09-11 DIAGNOSIS — Z7952 Long term (current) use of systemic steroids: Secondary | ICD-10-CM | POA: Insufficient documentation

## 2015-09-11 DIAGNOSIS — G629 Polyneuropathy, unspecified: Secondary | ICD-10-CM | POA: Diagnosis not present

## 2015-09-11 DIAGNOSIS — E1140 Type 2 diabetes mellitus with diabetic neuropathy, unspecified: Secondary | ICD-10-CM | POA: Diagnosis not present

## 2015-09-11 DIAGNOSIS — Z8619 Personal history of other infectious and parasitic diseases: Secondary | ICD-10-CM | POA: Diagnosis not present

## 2015-09-11 DIAGNOSIS — H578 Other specified disorders of eye and adnexa: Secondary | ICD-10-CM | POA: Diagnosis present

## 2015-09-11 DIAGNOSIS — Z7984 Long term (current) use of oral hypoglycemic drugs: Secondary | ICD-10-CM | POA: Diagnosis not present

## 2015-09-11 DIAGNOSIS — E559 Vitamin D deficiency, unspecified: Secondary | ICD-10-CM | POA: Insufficient documentation

## 2015-09-11 DIAGNOSIS — H5441 Blindness, right eye, normal vision left eye: Secondary | ICD-10-CM | POA: Insufficient documentation

## 2015-09-11 DIAGNOSIS — Z862 Personal history of diseases of the blood and blood-forming organs and certain disorders involving the immune mechanism: Secondary | ICD-10-CM | POA: Insufficient documentation

## 2015-09-11 DIAGNOSIS — E785 Hyperlipidemia, unspecified: Secondary | ICD-10-CM | POA: Diagnosis not present

## 2015-09-11 DIAGNOSIS — K5900 Constipation, unspecified: Secondary | ICD-10-CM | POA: Insufficient documentation

## 2015-09-11 DIAGNOSIS — F419 Anxiety disorder, unspecified: Secondary | ICD-10-CM | POA: Insufficient documentation

## 2015-09-11 MED ORDER — ERYTHROMYCIN 5 MG/GM OP OINT
1.0000 "application " | TOPICAL_OINTMENT | Freq: Once | OPHTHALMIC | Status: AC
  Administered 2015-09-11: 1 via OPHTHALMIC
  Filled 2015-09-11: qty 3.5

## 2015-09-11 MED ORDER — METHOCARBAMOL 500 MG PO TABS: 500.0000 mg | ORAL_TABLET | Freq: Two times a day (BID) | ORAL | Status: DC

## 2015-09-11 NOTE — Discharge Instructions (Signed)
Use the eye ointment to your right eye 4 times a day for 1 week. Call your eye doctor to schedule a follow-up visit  How to Use Eye Drops and Eye [place] Follow these steps when applying eye drops:  Wash your hands.  Tilt your head back.  Put a finger under your eye and use it to gently pull your lower lid downward. Keep that finger in place.  Using your other hand, hold the dropper between your thumb and index finger.  Position the dropper just over the edge of the lower lid. Hold it as close to your eye as you can without touching the dropper to your eye.  Steady your hand. One way to do this is to lean your index finger against your brow.  Look up.  Slowly and gently squeeze one drop of medicine into your eye.  Close your eye.  Place a finger between your lower eyelid and your nose. Press gently for 2 minutes. This increases the amount of time that the medicine is exposed to the eye. It also reduces side effects that can develop if the drop gets into the bloodstream through the nose. HOW TO APPLY EYE OINTMENTS Follow these steps when applying eye ointments:  Wash your hands.  Put a finger under your eye and use it to gently pull your lower lid downward. Keep that finger in place.  Using your other hand, place the tip of the tube between your thumb and index finger with the remaining fingers braced against your cheek or nose.  Hold the tube just over the edge of your lower lid without touching the tube to your lid or eyeball.  Look up.  Line the inner part of your lower lid with ointment.  Gently pull up on your upper lid and look down. This will force the ointment to spread over the surface of the eye.  Release the upper lid.  If you can, close your eyes for 1-2 minutes. Do not rub your eyes. If you applied the ointment correctly, your vision will be blurry for a few minutes. This is normal. ADDITIONAL INFORMATION  Make sure to use the eye  drops or ointment as told by your health care provider.  If you have been told to use both eye drops and an eye ointment, apply the eye drops first, then wait 3-4 minutes before you apply the ointment.  Try not to touch the tip of the dropper or tube to your eye. A dropper or tube that has touched the eye can become contaminated.   This information is not intended to replace advice given to you by your health care provider. Make sure you discuss any questions you have with your health care provider.   Document Released: 01/17/2001 Document Revised: 02/25/2015 Document Reviewed: 10/07/2014 Elsevier Interactive Patient Education 2016 Elsevier Inc.  Bacterial Conjunctivitis Bacterial conjunctivitis, commonly called pink eye, is an inflammation of the clear membrane that covers the white part of the eye (conjunctiva). The inflammation can also happen on the underside of the eyelids. The blood vessels in the conjunctiva become inflamed, causing the eye to become red or pink. Bacterial conjunctivitis may spread easily from one eye to another and from person to person (contagious).  CAUSES  Bacterial conjunctivitis is caused by bacteria. The bacteria may come from your own skin, your upper respiratory tract, or from someone else with bacterial conjunctivitis. SYMPTOMS  The normally white color of the eye or the underside of  the eyelid is usually pink or red. The pink eye is usually associated with irritation, tearing, and some sensitivity to light. Bacterial conjunctivitis is often associated with a thick, yellowish discharge from the eye. The discharge may turn into a crust on the eyelids overnight, which causes your eyelids to stick together. If a discharge is present, there may also be some blurred vision in the affected eye. DIAGNOSIS  Bacterial conjunctivitis is diagnosed by your caregiver through an eye exam and the symptoms that you report. Your caregiver looks for changes in the surface tissues  of your eyes, which may point to the specific type of conjunctivitis. A sample of any discharge may be collected on a cotton-tip swab if you have a severe case of conjunctivitis, if your cornea is affected, or if you keep getting repeat infections that do not respond to treatment. The sample will be sent to a lab to see if the inflammation is caused by a bacterial infection and to see if the infection will respond to antibiotic medicines. TREATMENT   Bacterial conjunctivitis is treated with antibiotics. Antibiotic eyedrops are most often used. However, antibiotic ointments are also available. Antibiotics pills are sometimes used. Artificial tears or eye washes may ease discomfort. HOME CARE INSTRUCTIONS   To ease discomfort, apply a cool, clean washcloth to your eye for 10-20 minutes, 3-4 times a day.  Gently wipe away any drainage from your eye with a warm, wet washcloth or a cotton ball.  Wash your hands often with soap and water. Use paper towels to dry your hands.  Do not share towels or washcloths. This may spread the infection.  Change or wash your pillowcase every day.  You should not use eye makeup until the infection is gone.  Do not operate machinery or drive if your vision is blurred.  Stop using contact lenses. Ask your caregiver how to sterilize or replace your contacts before using them again. This depends on the type of contact lenses that you use.  When applying medicine to the infected eye, do not touch the edge of your eyelid with the eyedrop bottle or ointment tube. SEEK IMMEDIATE MEDICAL CARE IF:   Your infection has not improved within 3 days after beginning treatment.  You had yellow discharge from your eye and it returns.  You have increased eye pain.  Your eye redness is spreading.  Your vision becomes blurred.  You have a fever or persistent symptoms for more than 2-3 days.  You have a fever and your symptoms suddenly get worse.  You have facial pain,  redness, or swelling. MAKE SURE YOU:   Understand these instructions.  Will watch your condition.  Will get help right away if you are not doing well or get worse.   This information is not intended to replace advice given to you by your health care provider. Make sure you discuss any questions you have with your health care provider.   Document Released: 10/11/2005 Document Revised: 11/01/2014 Document Reviewed: 03/13/2012 Elsevier Interactive Patient Education [employer].

## 2015-09-11 NOTE — ED Notes (Signed)
Pt requesting taxi voucher but stated to this nurse "I can take the bus home but I dont want to, I'm legally blind" Pt can see out of L eye and is not legally blind in left eye and confirmed with this nurse. I then spoke with AD who allowed me to give bus pass to the pt. Pt then upset and stating that she can no longer take the bus and demands to speak to a supervisor. Pt yelled at this nurse out in the waiting room.

## 2015-09-11 NOTE — ED Provider Notes (Signed)
CSN: [id number]     Arrival date & time 09/11/15  1349 History   First MD Initiated Contact with Patient 09/11/15 1714     Chief Complaint  Patient presents with  . Eye Drainage  . Peripheral Neuropathy  . Hypertension     (Consider location/radiation/quality/duration/timing/severity/associated sxs/prior Treatment) HPI Comments: Patient here complaining of drainage characterized as clear at times and yellow at times 2 weeks from her right eye. Denies any fever or chills. No severe headache. 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 Also has a cataract. She also notes some pruritus as well 2. Recently she is without a primary care doctor at this time. 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 Is requesting treatment for that as well 2.  The history is provided by the patient.    Past Medical History  Diagnosis Date  . Hypertension   . Hyperlipidemia   . Diabetes mellitus   . GERD (gastroesophageal reflux disease)   . Constipation, chronic   . Glaucoma   . Diabetic retinopathy   . Blind right eye   . Anemia   . Dental caries   . High risk sexual behavior   . Vitamin D deficiency   . Microalbuminuria   . Anxiety   . H. pylori infection   . Hepatitis C carrier   . Diabetic neuropathy ([place])   . Tobacco dependence   . Nonspecific elevation of levels of transaminase or lactic acid dehydrogenase (LDH)   . Insomnia    Past Surgical History  Procedure Laterality Date  . Abdominal hysterectomy  04/30/2009    PARTIAL  . Esophagogastroduodenoscopy    . Colonoscopy     Family History  Problem Relation Age of Onset  . Diabetes Father   . High blood pressure Mother   . High blood pressure Father   . Diabetes Mother   . Thyroid disease Mother    Social History  Substance Use Topics  . Smoking status: Current Every Day Smoker -- 0.50 packs/day    Types: Cigarettes  . Smokeless tobacco: Never  Used     Comment: trying to quit  . Alcohol Use: Yes     Comment: wine   OB History    No data available     Review of Systems  All other systems reviewed and are negative.     Allergies  Review of patient's allergies indicates no known allergies.  Home Medications   Prior to Admission medications   Medication Sig Start Date End Date Taking? Authorizing Provider  albuterol (PROVENTIL HFA;VENTOLIN HFA) 108 (90 BASE) MCG/ACT inhaler Inhale 2 puffs into the lungs every 4 (four) hours as needed for shortness of breath. For shortness of breath    Historical Provider, MD  amLODipine (NORVASC) 10 MG tablet  01/08/10   Historical Provider, MD  atropine 1 % ophthalmic solution Place 1 drop into both eyes 2 (two) times daily.    Historical Provider, MD  benazepril (LOTENSIN) 20 MG tablet Take 20 mg by mouth every morning.    Historical Provider, MD  buprenorphine-naloxone (SUBOXONE) 2-0.5 MG SUBL SL tablet Place 1 tablet under the tongue 3 (three) times daily.     Historical Provider, MD  Cholecalciferol (VITAMIN D3) 1200 UNIT/15ML LIQD Take 15 mLs by mouth daily.    Historical Provider, MD  clonazePAM (KLONOPIN) 1 MG tablet Take 1 mg by mouth 3 (three) times  daily as needed for anxiety.    Historical Provider, MD  furosemide (LASIX) 20 MG tablet Take 20 mg by mouth daily.    Historical Provider, MD  Gabapentin, PHN, 600 MG TABS Take 600 mg by mouth 3 (three) times daily. 03/07/14   Marcial Pacas, MD  insulin lispro protamine-lispro (HUMALOG 75/25 MIX) (75-25) 100 UNIT/ML SUSP injection Inject 55 Units into the skin 2 (two) times daily before a meal.    Historical Provider, MD  Ledipasvir-Sofosbuvir (HARVONI) 90-400 MG TABS Take 1 tablet by mouth daily. 05/21/14   Thayer Headings, MD  levothyroxine (SYNTHROID, LEVOTHROID) 50 MCG tablet Take 50 mcg by mouth daily before breakfast.    Historical Provider, MD  lisinopril (PRINIVIL,ZESTRIL) 40 MG tablet Take 40 mg by mouth every morning.     Historical  Provider, MD  metFORMIN (GLUCOPHAGE) 500 MG tablet Take 1,000 mg by mouth 2 (two) times daily with a meal.     Historical Provider, MD  metoCLOPramide (REGLAN) 10 MG tablet Take 1 tablet (10 mg total) by mouth 4 (four) times daily -  before meals and at bedtime. 05/01/14   Gatha Mayer, MD  omeprazole (PRILOSEC) 20 MG capsule Take 1 capsule (20 mg total) by mouth daily. Has to be taken at the same time with Harvoni FASTED 05/28/14   Thayer Headings, MD  oxyCODONE-acetaminophen (PERCOCET) 7.5-325 MG per tablet Take 1 tablet by mouth every 6 (six) hours as needed for pain.    Historical Provider, MD  polyethylene glycol (MIRALAX / GLYCOLAX) packet Take 17 g by mouth 2 (two) times daily as needed for mild constipation.    Historical Provider, MD  potassium chloride (K-DUR) 10 MEQ tablet Take 10 mEq by mouth daily.    Historical Provider, MD  pravastatin (PRAVACHOL) 80 MG tablet  01/08/10   Historical Provider, MD  prednisoLONE acetate (PRED FORTE) 1 % ophthalmic suspension Apply to eye.    Historical Provider, MD  Travoprost, BAK Free, (TRAVATAN) 0.004 % SOLN ophthalmic solution Place 1 drop into both eyes at bedtime.    Historical Provider, MD  traZODone (DESYREL) 150 MG tablet Take 150-300 mg by mouth at bedtime as needed for sleep.     Historical Provider, MD   BP 179/106 mmHg  Pulse 81  Temp(Src) 98.5 F (36.9 C) (Oral)  Resp 14  SpO2 100% Physical Exam  Constitutional: She is oriented to person, place, and time. She appears well-developed and well-nourished.  Non-toxic appearance. No distress.  HENT:  Head: Normocephalic and atraumatic.  Eyes: Conjunctivae, EOM and lids are normal.  Right eye with obvious cataract noted. Globe is soft to palpation. Clear drainage noted without purulence. No surrounding periorbital edema. Extraocular muscles are intact. No scleral injection noted.  Neck: Normal range of motion. Neck supple. No tracheal deviation present. No thyroid mass present.   Cardiovascular: Normal rate, regular rhythm and normal heart sounds.  Exam reveals no gallop.   No murmur heard. Pulmonary/Chest: Effort normal and breath sounds normal. No stridor. No respiratory distress. She has no decreased breath sounds. She has no wheezes. She has no rhonchi. She has no rales.  Abdominal: Soft. Normal appearance and bowel sounds are normal. She exhibits no distension. There is no tenderness. There is no rebound and no CVA tenderness.  Musculoskeletal: Normal range of motion. She exhibits no edema or tenderness.  Neurovascular status intact  Neurological: She is alert and oriented to person, place, and time. She has normal strength. No cranial nerve deficit or  sensory deficit. GCS eye subscore is 4. GCS verbal subscore is 5. GCS motor subscore is 6.  Skin: Skin is warm and dry. No abrasion and no rash noted.  Psychiatric: She has a normal mood and affect. Her speech is normal and behavior is normal.  Nursing note and vitals reviewed.   ED Course  Procedures (including critical care time) Labs Review Labs Reviewed - No data to display  Imaging Review No results found. I have personally reviewed and evaluated these images and lab results as part of my medical decision-making.   EKG Interpretation None      MDM   Final diagnoses:  None    Patient has been seen by nephrology last week due to renal insufficiency and has follow-up scheduled for that. Blood pressure noted here. States that this is her baseline. I will not make any adjustments to her hypertensive regimen. Patient's right eye will be treated for possible infection. We placed on muscle relaxants to help with her chronic neuropathy.    Lacretia Leigh, MD 09/11/15 [phone]

## 2015-09-11 NOTE — ED Notes (Addendum)
Pt here with drainage to right eye for 2 weeks. Is blind in her right eye for 5 years, has glaucoma and cataract to right eye. Has tube placed in right eye for glaucoma. Reports neuropathy in bilateral hands. Her BP has been high for several days, is wondering if her BP meds need to be adjusted. Pt is a x 4. Also has h/a. Denies cp or sob

## 2016-03-30 ENCOUNTER — Ambulatory Visit (HOSPITAL_COMMUNITY)
Admission: EM | Admit: 2016-03-30 | Discharge: 2016-03-30 | Disposition: A | Discharge status: Home / Self Care | Payer: Medicare Other | Attending: Family Medicine | Admitting: Family Medicine

## 2016-03-30 ENCOUNTER — Encounter (HOSPITAL_COMMUNITY): Payer: Self-pay | Admitting: Emergency Medicine

## 2016-03-30 DIAGNOSIS — Z794 Long term (current) use of insulin: Secondary | ICD-10-CM | POA: Diagnosis not present

## 2016-03-30 DIAGNOSIS — I10 Essential (primary) hypertension: Secondary | ICD-10-CM

## 2016-03-30 DIAGNOSIS — H10403 Unspecified chronic conjunctivitis, bilateral: Secondary | ICD-10-CM | POA: Diagnosis not present

## 2016-03-30 DIAGNOSIS — E1142 Type 2 diabetes mellitus with diabetic polyneuropathy: Secondary | ICD-10-CM | POA: Diagnosis not present

## 2016-03-30 LAB — GLUCOSE, CAPILLARY: GLUCOSE-CAPILLARY: 185 mg/dL — AB (ref 65–99)

## 2016-03-30 MED ORDER — POLYMYXIN B-TRIMETHOPRIM 10000-0.1 UNIT/ML-% OP SOLN: 1.0000 [drp] | OPHTHALMIC | Status: DC

## 2016-03-30 MED ORDER — GLUCOSE BLOOD VI STRP: ORAL_STRIP | Status: DC

## 2016-03-30 MED ORDER — KETOTIFEN FUMARATE 0.025 % OP SOLN: 1.0000 [drp] | Freq: Two times a day (BID) | OPHTHALMIC | Status: DC

## 2016-03-30 MED ORDER — GABAPENTIN 300 MG PO CAPS: ORAL_CAPSULE | ORAL | Status: DC

## 2016-03-30 NOTE — ED Notes (Signed)
Pt here for refills on meds and c/o bilatleral eye redness and watery... Does no have a PCP at the moment... Last had most of her meds x3 days ago... A&O x4... No acute distress.

## 2016-03-30 NOTE — Discharge Instructions (Signed)
Basics of Medicine Management WHAT SHOULD I DO WHEN I AM TAKING MEDICINES?  1. Read all of the labels and the inserts that come with your medicines. Review the information often. 2. Talk with your pharmacist if you notice a change in the size, color, or shape of your medicines. 3. Try to get all of your medicines at one pharmacy. The pharmacist will have all your information and will understand possible drug interactions. 4. Ask your health care provider any questions that you have about your prescribed medicines and any over-the-counter medicines, vitamins, and herbal or dietary supplements that you take. It is important to make sure that nothing will interact with any of your prescribed medicines. WHAT SHOULD I KNOW ABOUT MY MEDICINES? 1. Know the potential side effects for each medicine that you take. 2. Know what each of your medicines looks like. This includes size, color, and shape. 1. If you are getting confused and having trouble recognizing your different medicines, ask your health care provider or pharmacist about changing your medicines or helping you to identify them more easily. HOW CAN I TAKE MY MEDICINES SAFELY?  Take medicines only as directed by your health care provider.  Do not take more of your medicine than instructed.  Do not take anyone else's medicines.  Do not share your medicines with other people.  Do not stop taking your medicines unless you have talked about that with your health care provider.  You may need to avoid alcohol or certain foods or liquids with one or more of your medicines. Follow your health care provider's instructions.  Do not split, mash, or chew your medicines unless your health care provider tells you to do so. Tell your health care provider if you have trouble swallowing your medicines.  For every liquid medicine, use the dosing container that was provided. HOW SHOULD I ORGANIZE MY MEDICINES?  Use a tool, such as a weekly pillbox, a  written chart from your health care provider, a notebook, or your own calendar to organize your medicine schedule.  If you have trouble recognizing your different medicines, keep them in their original bottles.  Create reminders for taking your medicines. Use sticky notes, or use alarms on your watch, mobile device, or phone calendar.  Your organization system should help you to remember the following information about each medicine:  Name of the medicine.  Dosage.  Schedule. This includes the day and time when it should be taken.  Appearance. This includes color, shape, size, and stamp.  How to take your medicines. You may need to take them with or without certain foods, on an empty stomach, with fluids, or by following some other instruction.  More advanced medicine management systems are also available. These offer weekly or monthly options that are complete with storage, alarms, and visual and audio prompts.  Review your medicine schedule with a family member, friend, or caregiver. Other household members should understand your medicines.  If you have trouble reading the names of your different medicines, ask your pharmacist to provide your medicines in containers with large print.  If you take any medicines on an "as needed" basis, such as medicines for nausea or pain, it is important that you remember what you have taken and when you did so. Write down the following information each time you take an "as needed" medicine: the name, the dosage, and the date and time that you took it. HOW SHOULD I PLAN AHEAD FOR TRAVEL?  Take your pillbox, medicines, and  organization system with you when you travel.   Have your medicines refilled before you leave for travel. This will ensure that you do not run out of your medicines while you are away from home.  Always carry an updated list of your medicines with you. If there is an emergency, a respondent can quickly see what medicines you are  taking. HOW SHOULD I STORE AND DISCARD MY MEDICINES?  Store medicines in a cool, dry area away from light or as directed by your pharmacist or health care provider. The bathroom is not a good place for medicine storage because of heat and humidity.  Store your medicines away from chemicals, medicines for your pet, and medicines of other household members.  Keep medicines where children cannot reach them. Do not leave them on counters or bedside tables. Store them in high cabinets or on high shelves.  Check expiration dates regularly. Do not take expired medicines. Discard medicines that are older than the expiration date.  Learn about the best way to dispose of each medicine that you take. Find out if your local government recycling program, hospital, or pharmacy has a medicine take-back program for safe disposal. If not, some medicines may be mixed with inedible substances and thrown away in the trash in a sealed bag or empty container. WHAT SHOULD I REMEMBER?  Tell your health care provider if you experience side effects, you have new symptoms, or you have other concerns. There may be dosing changes or alternative medicines that would be better for you.  Review your medicines regularly with your health care provider. Ask if you need to continue to take each medicine, and discuss how well each one is working. Medicines, diet, medical conditions, weight changes, and other habits can all affect how medicines work.  Refill your medicines early so that you do not risk running out.  In case of an accidental overdose, call your local [place] at [phone] or visit your local emergency department immediately. This is important. WHAT SHOULD I KNOW ABOUT GIVING MEDICINES TO MY CHILD?  Use positive reinforcement to help your child take necessary medicines. Try singing, cuddling, and rewards.  Use only the syringes, droppers, dosing spoons, or dosing cups from your child's health  care provider or pharmacist.  Always wash your hands before giving medicines.  Learn about the medicine policies at your child's school.  Meet with the school nurse to review your child's medicine schedule in detail.  Do not send oral medicines to school with your child.  If your child has trouble taking medicine, forgets a dose, or spits it up, talk with his or her health care provider.  Do not give over-the-counter cough and cold medicines to your child who is younger than 27 years old, unless directed by his or her health care provider.  Do not give your child aspirin unless instructed to do so by your child's pediatrician or cardiologist.  Make sure that your child knows how to use an inhaler properly, if needed.   This information is not intended to replace advice given to you by your health care provider. Make sure you discuss any questions you have with your health care provider.   Document Released: 01/26/2011 Document Revised: 11/01/2014 Document Reviewed: 06/13/2014 Elsevier Interactive Patient Education 2016 [employer]  Blood Glucose Monitoring, Adult Monitoring your blood glucose (also know as blood sugar) helps you to manage your diabetes. It also helps you and your health care provider monitor your diabetes and determine  how well your treatment plan is working. WHY SHOULD YOU MONITOR YOUR BLOOD GLUCOSE? 5. It can help you understand how food, exercise, and medicine affect your blood glucose. 6. It allows you to know what your blood glucose is at any given moment. You can quickly tell if you are having low blood glucose (hypoglycemia) or high blood glucose (hyperglycemia). 7. It can help you and your health care provider know how to adjust your medicines. 8. It can help you understand how to manage an illness or adjust medicine for exercise. WHEN SHOULD YOU TEST? Your health care provider will help you decide how often you should check your blood glucose. This may  depend on the type of diabetes you have, your diabetes control, or the types of medicines you are taking. Be sure to write down all of your blood glucose readings so that this information can be reviewed with your health care provider. See below for examples of testing times that your health care provider may suggest. Type 1 Diabetes 3. Test at least 2 times per day if your diabetes is well controlled, if you are using an insulin pump, or if you perform multiple daily injections. 4. If your diabetes is not well controlled or if you are sick, you may need to test more often. 5. It is a good idea to also test: 1. Before every insulin injection. 2. Before and after exercise. 3. Between meals and 2 hours after a meal. 4. Occasionally between 2:00 a.m. and 3:00 a.m. Type 2 Diabetes  If you are taking insulin, test at least 2 times per day. However, it is best to test before every insulin injection.  If you take medicines by mouth (orally), test 2 times a day.  If you are on a controlled diet, test once a day.  If your diabetes is not well controlled or if you are sick, you may need to monitor more often. HOW TO MONITOR YOUR BLOOD GLUCOSE Supplies Needed  Blood glucose meter.  Test strips for your meter. Each meter has its own strips. You must use the strips that go with your own meter.  A pricking needle (lancet).  A device that holds the lancet (lancing device).  A journal or log book to write down your results. Procedure  Wash your hands with soap and water. Alcohol is not preferred.  Prick the side of your finger (not the tip) with the lancet.  Gently milk the finger until a small drop of blood appears.  Follow the instructions that come with your meter for inserting the test strip, applying blood to the strip, and using your blood glucose meter. Other Areas to Get Blood for Testing Some meters allow you to use other areas of your body (other than your finger) to test your  blood. These areas are called alternative sites. The most common alternative sites are:  The forearm.  The thigh.  The back area of the lower leg.  The palm of the hand. The blood flow in these areas is slower. Therefore, the blood glucose values you get may be delayed, and the numbers are different from what you would get from your fingers. Do not use alternative sites if you think you are having hypoglycemia. Your reading will not be accurate. Always use a finger if you are having hypoglycemia. Also, if you cannot feel your lows (hypoglycemia unawareness), always use your fingers for your blood glucose checks. ADDITIONAL TIPS FOR GLUCOSE MONITORING  Do not reuse lancets.  Always  carry your supplies with you.  All blood glucose meters have a 24-hour "hotline" number to call if you have questions or need help.  Adjust (calibrate) your blood glucose meter with a control solution after finishing a few boxes of strips. BLOOD GLUCOSE RECORD KEEPING It is a good idea to keep a daily record or log of your blood glucose readings. Most glucose meters, if not all, keep your glucose records stored in the meter. Some meters come with the ability to download your records to your home computer. Keeping a record of your blood glucose readings is especially helpful if you are wanting to look for patterns. Make notes to go along with the blood glucose readings because you might forget what happened at that exact time. Keeping good records helps you and your health care provider to work together to achieve good diabetes management.    This information is not intended to replace advice given to you by your health care provider. Make sure you discuss any questions you have with your health care provider.   Document Released: 10/14/2003 Document Revised: 11/01/2014 Document Reviewed: 03/05/2013 Elsevier Interactive Patient Education 2016 [employer].  Hypertension Hypertension is another name for high  blood pressure. High blood pressure forces your heart to work harder to pump blood. A blood pressure reading has two numbers, which includes a higher number over a lower number (example: 110/72). HOME CARE  9. Have your blood pressure rechecked by your doctor. 10. Only take medicine as told by your doctor. Follow the directions carefully. The medicine does not work as well if you skip doses. Skipping doses also puts you at risk for problems. 11. Do not smoke. 12. Monitor your blood pressure at home as told by your doctor. GET HELP IF: 6. You think you are having a reaction to the medicine you are taking. 7. You have repeat headaches or feel dizzy. 8. You have puffiness (swelling) in your ankles. 9. You have trouble with your vision. GET HELP RIGHT AWAY IF:   You get a very bad headache and are confused.  You feel weak, numb, or faint.  You get chest or belly (abdominal) pain.  You throw up (vomit).  You cannot breathe very well. MAKE SURE YOU:   Understand these instructions.  Will watch your condition.  Will get help right away if you are not doing well or get worse.   This information is not intended to replace advice given to you by your health care provider. Make sure you discuss any questions you have with your health care provider.   Document Released: 03/29/2008 Document Revised: 10/16/2013 Document Reviewed: 08/03/2013 Elsevier Interactive Patient Education 2016 [employer].  How to Use Eye Drops and Eye Ointments HOW TO APPLY EYE DROPS Follow these steps when applying eye drops: 13. Wash your hands. 14. Tilt your head back. 15. Put a finger under your eye and use it to gently pull your lower lid downward. Keep that finger in place. 16. Using your other hand, hold the dropper between your thumb and index finger. 17. Position the dropper just over the edge of the lower lid. Hold it as close to your eye as you can without touching the dropper to your eye. 18. Steady  your hand. One way to do this is to lean your index finger against your brow. 19. Look up. 20. Slowly and gently squeeze one drop of medicine into your eye. 21. Close your eye. 22. Place a finger between your lower eyelid  and your nose. Press gently for 2 minutes. This increases the amount of time that the medicine is exposed to the eye. It also reduces side effects that can develop if the drop gets into the bloodstream through the nose. HOW TO APPLY EYE OINTMENTS Follow these steps when applying eye ointments: 10. Wash your hands. 11. Put a finger under your eye and use it to gently pull your lower lid downward. Keep that finger in place. 12. Using your other hand, place the tip of the tube between your thumb and index finger with the remaining fingers braced against your cheek or nose. 13. Hold the tube just over the edge of your lower lid without touching the tube to your lid or eyeball. 14. Look up. 15. Line the inner part of your lower lid with ointment. 16. Gently pull up on your upper lid and look down. This will force the ointment to spread over the surface of the eye. 17. Release the upper lid. 18. If you can, close your eyes for 1-2 minutes. Do not rub your eyes. If you applied the ointment correctly, your vision will be blurry for a few minutes. This is normal. ADDITIONAL INFORMATION  Make sure to use the eye drops or ointment as told by your health care provider.  If you have been told to use both eye drops and an eye ointment, apply the eye drops first, then wait 3-4 minutes before you apply the ointment.  Try not to touch the tip of the dropper or tube to your eye. A dropper or tube that has touched the eye can become contaminated.   This information is not intended to replace advice given to you by your health care provider. Make sure you discuss any questions you have with your health care provider.   Document Released: 01/17/2001 Document Revised: 02/25/2015 Document  Reviewed: 10/07/2014 Elsevier Interactive Patient Education [employer].

## 2016-03-30 NOTE — ED Provider Notes (Signed)
CSN: [id number]     Arrival date & time 03/30/16  1256 History   First MD Initiated Contact with Patient 03/30/16 1309     Chief Complaint  Patient presents with  . Medication Refill  . Eye Problem   (Consider location/radiation/quality/duration/timing/severity/associated sxs/prior Treatment) HPI Comments: 50 year old female who appears much older than stated age presents to the urgent care for bilateral eye redness, irritation and clear watery drainage. This started approximately 3 months ago. She was seen in the urgent care at that time was treated with medication and it did resolve for short. Time before returning. 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 She has had surgery on both eyes and she has limited vision only in the left eye. Her medical history includes poorly controlled type 2 diabetes mellitus, diabetic retinopathy, nephropathy, hypertension, high risk sexual behavior resulting in hepatitis C and various STDs, and diabetic neuropathy. She continues to smoke a half a pack per day. She admits that she does not check her blood sugars regularly, more like once or twice a week. She gives a story of recently being told that she does not have a physician because he moved out of town. She is requesting medication on gabapentin and an strips for her glucometer.   Past Medical History  Diagnosis Date  . Hypertension   . Hyperlipidemia   . Diabetes mellitus   . GERD (gastroesophageal reflux disease)   . Constipation, chronic   . Glaucoma   . Diabetic retinopathy   . Blind right eye   . Anemia   . Dental caries   . High risk sexual behavior   . Vitamin D deficiency   . Microalbuminuria   . Anxiety   . H. pylori infection   . Hepatitis C carrier   . Diabetic neuropathy ([place])   . Tobacco dependence   . Nonspecific elevation of levels of transaminase or lactic acid dehydrogenase (LDH)   . Insomnia    Past Surgical History  Procedure  Laterality Date  . Abdominal hysterectomy  04/30/2009    PARTIAL  . Esophagogastroduodenoscopy    . Colonoscopy     Family History  Problem Relation Age of Onset  . Diabetes Father   . High blood pressure Mother   . High blood pressure Father   . Diabetes Mother   . Thyroid disease Mother    Social History  Substance Use Topics  . Smoking status: Current Every Day Smoker -- 0.50 packs/day    Types: Cigarettes  . Smokeless tobacco: Never Used     Comment: trying to quit  . Alcohol Use: Yes     Comment: wine   OB History    No data available     Review of Systems  Constitutional: Negative for fever, activity change and fatigue.  HENT: Negative for congestion, rhinorrhea and trouble swallowing.   Eyes: Positive for pain, discharge, redness, itching and visual disturbance.  Respiratory: Negative.   Cardiovascular: Negative.  Negative for palpitations and leg swelling.  Gastrointestinal: Negative.   Endocrine: Positive for polydipsia and polyuria.  Genitourinary: Negative for dysuria and pelvic pain.  Skin: Negative.  Negative for wound.  Neurological: Negative for tremors and seizures.  Psychiatric/Behavioral: Negative for behavioral problems.    Allergies  Review of patient's allergies indicates no known allergies.  Home Medications   Prior to Admission medications   Medication Sig Start Date End Date Taking? Authorizing Provider  albuterol (PROVENTIL HFA;VENTOLIN HFA) 108 (90  BASE) MCG/ACT inhaler Inhale 2 puffs into the lungs every 4 (four) hours as needed for shortness of breath. For shortness of breath   Yes Historical Provider, MD  amLODipine (NORVASC) 10 MG tablet  01/08/10  Yes Historical Provider, MD  atropine 1 % ophthalmic solution Place 1 drop into both eyes 2 (two) times daily.   Yes Historical Provider, MD  benazepril (LOTENSIN) 20 MG tablet Take 20 mg by mouth every morning.   Yes Historical Provider, MD  buprenorphine-naloxone (SUBOXONE) 2-0.5 MG SUBL SL  tablet Place 1 tablet under the tongue 3 (three) times daily.    Yes Historical Provider, MD  Cholecalciferol (VITAMIN D3) 1200 UNIT/15ML LIQD Take 15 mLs by mouth daily.   Yes Historical Provider, MD  clonazePAM (KLONOPIN) 1 MG tablet Take 1 mg by mouth 3 (three) times daily as needed for anxiety.   Yes Historical Provider, MD  furosemide (LASIX) 20 MG tablet Take 20 mg by mouth daily.   Yes Historical Provider, MD  Gabapentin, PHN, 600 MG TABS Take 600 mg by mouth 3 (three) times daily. 03/07/14  Yes Marcial Pacas, MD  insulin lispro protamine-lispro (HUMALOG 75/25 MIX) (75-25) 100 UNIT/ML SUSP injection Inject 55 Units into the skin 2 (two) times daily before a meal.   Yes Historical Provider, MD  levothyroxine (SYNTHROID, LEVOTHROID) 50 MCG tablet Take 50 mcg by mouth daily before breakfast.   Yes Historical Provider, MD  lisinopril (PRINIVIL,ZESTRIL) 40 MG tablet Take 40 mg by mouth every morning.    Yes Historical Provider, MD  methocarbamol (ROBAXIN) 500 MG tablet Take 1 tablet (500 mg total) by mouth 2 (two) times daily. 09/11/15  Yes Lacretia Leigh, MD  polyethylene glycol [facility] / GLYCOLAX) packet Take 17 g by mouth 2 (two) times daily as needed for mild constipation.   Yes Historical Provider, MD  pravastatin (PRAVACHOL) 80 MG tablet  01/08/10  Yes Historical Provider, MD  traZODone (DESYREL) 150 MG tablet Take 150-300 mg by mouth at bedtime as needed for sleep.    Yes Historical Provider, MD  gabapentin (NEURONTIN) 300 MG capsule Take 1 tablet at night for tonight stand one twice a day. 03/30/16   Janne Napoleon, NP  glucose blood test strip Check your sugar in the twice daily and record 03/30/16   Janne Napoleon, NP  ketotifen (ZADITOR) 0.025 % ophthalmic solution Place 1 drop into both eyes 2 (two) times daily. 03/30/16   Janne Napoleon, NP  Ledipasvir-Sofosbuvir (HARVONI) 90-400 MG TABS Take 1 tablet by mouth daily. 05/21/14   Thayer Headings, MD  metFORMIN (GLUCOPHAGE) 500 MG tablet Take 1,000 mg by mouth 2  (two) times daily with a meal.     Historical Provider, MD  metoCLOPramide (REGLAN) 10 MG tablet Take 1 tablet (10 mg total) by mouth 4 (four) times daily -  before meals and at bedtime. 05/01/14   Gatha Mayer, MD  omeprazole (PRILOSEC) 20 MG capsule Take 1 capsule (20 mg total) by mouth daily. Has to be taken at the same time with Harvoni FASTED 05/28/14   Thayer Headings, MD  oxyCODONE-acetaminophen (PERCOCET) 7.5-325 MG per tablet Take 1 tablet by mouth every 6 (six) hours as needed for pain.    Historical Provider, MD  potassium chloride (K-DUR) 10 MEQ tablet Take 10 mEq by mouth daily.    Historical Provider, MD  prednisoLONE acetate (PRED FORTE) 1 % ophthalmic suspension Apply to eye.    Historical Provider, MD  Travoprost, BAK Free, (TRAVATAN) 0.004 % SOLN  ophthalmic solution Place 1 drop into both eyes at bedtime.    Historical Provider, MD  trimethoprim-polymyxin b (POLYTRIM) ophthalmic solution Place 1 drop into both eyes every 4 (four) hours. 03/30/16   Janne Napoleon, NP   Meds Ordered and Administered this Visit  Medications - No data to display  BP 159/105 mmHg  Pulse 94  Temp(Src) 97.7 F (36.5 C) (Oral)  Resp 16  SpO2 97% No data found.   Physical Exam  Constitutional: She is oriented to person, place, and time. She appears well-developed and well-nourished. No distress.  HENT:  Head: Normocephalic and atraumatic.  Poor dental repair.  Eyes: EOM are normal.  Bilateral conjunctiva with erythema and swelling, particularly of the lower lids. Clear watery drainage. The right eye is covered with a gray opaque material. She is blind in that eye. 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 No purulence.  Neck: Normal range of motion. Neck supple.  Cardiovascular: Normal rate and regular rhythm.   Pulmonary/Chest: Effort normal and breath sounds normal. No respiratory distress. She has no wheezes.  Musculoskeletal: She exhibits no edema.   Lymphadenopathy:    She has no cervical adenopathy.  Neurological: She is alert and oriented to person, place, and time. She exhibits normal muscle tone.  Skin: Skin is warm and dry.  Psychiatric: She has a normal mood and affect.  Nursing note and vitals reviewed.   ED Course  Procedures (including critical care time)  Labs Review Labs Reviewed  GLUCOSE, CAPILLARY - Abnormal; Notable for the following:    Glucose-Capillary 185 (*)    All other components within normal limits    Imaging Review No results found.   Visual Acuity Review  Right Eye Distance:   Left Eye Distance:   Bilateral Distance:    Right Eye Near:   Left Eye Near:    Bilateral Near:         MDM   1. Type 2 diabetes mellitus with diabetic polyneuropathy, without long-term current use of insulin ([place])   2. Conjunctivitis, chronic, bilateral   3. Diabetic polyneuropathy associated with type 2 diabetes mellitus ([place])   4. Essential hypertension    Meds ordered this encounter  Medications  . trimethoprim-polymyxin b (POLYTRIM) ophthalmic solution    Sig: Place 1 drop into both eyes every 4 (four) hours.    Dispense:  10 mL    Refill:  0    Order Specific Question:  Supervising Provider    Answer:  Melony Overly [id number]  . gabapentin (NEURONTIN) 300 MG capsule    Sig: Take 1 tablet at night for tonight stand one twice a day.    Dispense:  60 capsule    Refill:  0    Order Specific Question:  Supervising Provider    Answer:  Melony Overly [id number]  . glucose blood test strip    Sig: Check your sugar in the twice daily and record    Dispense:  100 each    Refill:  0    Order Specific Question:  Supervising Provider    Answer:  Melony Overly [id number]  . ketotifen (ZADITOR) 0.025 % ophthalmic solution    Sig: Place 1 drop into both eyes 2 (two) times daily.    Dispense:  5 mL    Refill:  0    Order Specific Question:  Supervising Provider    Answer:  Bernadette Hoit has set up  appt with [facility]. Instructions and map given to pt.    Janne Napoleon, NP 03/30/16 1358

## 2016-04-30 ENCOUNTER — Other Ambulatory Visit: Payer: Self-pay | Admitting: Family Medicine

## 2016-04-30 ENCOUNTER — Ambulatory Visit (INDEPENDENT_AMBULATORY_CARE_PROVIDER_SITE_OTHER): Payer: Medicare Other | Admitting: Family Medicine

## 2016-04-30 ENCOUNTER — Encounter: Payer: Self-pay | Admitting: Family Medicine

## 2016-04-30 ENCOUNTER — Other Ambulatory Visit: Payer: Self-pay | Admitting: *Deleted

## 2016-04-30 VITALS — BP 199/104 | HR 93 | Temp 98.3°F | Resp 18 | Ht 61.0 in | Wt 114.0 lb

## 2016-04-30 DIAGNOSIS — E138 Other specified diabetes mellitus with unspecified complications: Secondary | ICD-10-CM | POA: Diagnosis not present

## 2016-04-30 DIAGNOSIS — H409 Unspecified glaucoma: Secondary | ICD-10-CM | POA: Diagnosis not present

## 2016-04-30 LAB — CBC WITH DIFFERENTIAL/PLATELET
BASOS PCT: 1 %
Basophils Absolute: 99 cells/uL (ref 0–200)
EOS ABS: 495 {cells}/uL (ref 15–500)
Eosinophils Relative: 5 %
HEMATOCRIT: 44.3 % (ref 35.0–45.0)
Hemoglobin: 14.2 g/dL (ref 11.7–15.5)
LYMPHS PCT: 48 %
Lymphs Abs: 4752 cells/uL — ABNORMAL HIGH (ref 850–3900)
MCH: 28.5 pg (ref 27.0–33.0)
MCHC: 32.1 g/dL (ref 32.0–36.0)
MCV: 88.8 fL (ref 80.0–100.0)
MONO ABS: 693 {cells}/uL (ref 200–950)
MONOS PCT: 7 %
MPV: 11.3 fL (ref 7.5–12.5)
NEUTROS PCT: 39 %
Neutro Abs: 3861 cells/uL (ref 1500–7800)
PLATELETS: 271 10*3/uL (ref 140–400)
RBC: 4.99 MIL/uL (ref 3.80–5.10)
RDW: 14.4 % (ref 11.0–15.0)
WBC: 9.9 10*3/uL (ref 3.8–10.8)

## 2016-04-30 LAB — TSH: TSH: 2.83 m[IU]/L

## 2016-04-30 MED ORDER — ATENOLOL 25 MG PO TABS: 25.0000 mg | ORAL_TABLET | Freq: Every day | ORAL | Status: DC

## 2016-04-30 MED ORDER — PANTOPRAZOLE SODIUM 40 MG PO TBEC: 40.0000 mg | DELAYED_RELEASE_TABLET | Freq: Every day | ORAL | Status: DC

## 2016-04-30 MED ORDER — GLUCOSE BLOOD VI STRP: ORAL_STRIP | Status: DC

## 2016-04-30 MED ORDER — ONETOUCH ULTRASOFT LANCETS MISC: Status: DC

## 2016-04-30 MED ORDER — GABAPENTIN 300 MG PO CAPS: ORAL_CAPSULE | ORAL | Status: DC

## 2016-04-30 NOTE — Progress Notes (Signed)
Patient ID: Tammy Moses, female   DOB: 1966/06/01, 50 y.o.   MRN: [id number]   Tammy Moses, is a 50 y.o. female  [id number]  [id number]  DOB - April 12, 1966  CC:  Chief Complaint  Patient presents with  . Establish Care       HPI: Tammy Moses is a 50 y.o. female here to establish care. Her main reason for this appointment today is to get a referral to opthalmology for glaucoma in left eye and cataracts in right. She is legally blind. She has been [facility] at [facility] for this in past. She reports not being on medications for glaucoma in over a year. She also has a history of hypertension and pedal edema. She is on amlodipine 10, Lasix 20 and lisinopril 40 mg. Her pressure today is 199/104, 208/104. She has diabetes and is on Humalog 75/25 and metformin 500 bid. She reports her last a1c a couple of weeks ago was around 8. She reports she is OK on her diabetic medications, but needs refill on strips. She also has a history of GERD, diabetic gastropresis and has been on Reglan and Protonix. She needs refills on these. She also needs refills on gabapentin for diabetic neuropathy. She reports no regular exercise and does not follow a diabetic diet. She reports smoking 4-5 cigarettes a day, drinks 3 glasses of wine weekly and does not use illicit drugs. Today we are focusing on information collection, referral to opthalmology, and refill of needed medications. Will have her return in one month for health maintenance.  No Known Allergies Past Medical History  Diagnosis Date  . Hypertension   . Hyperlipidemia   . Diabetes mellitus   . GERD (gastroesophageal reflux disease)   . Constipation, chronic   . Glaucoma   . Diabetic retinopathy   . Blind right eye   . Anemia   . Dental caries   . High risk sexual behavior   . Vitamin D deficiency   . Microalbuminuria   . Anxiety   . H. pylori infection   . Hepatitis C carrier   . Diabetic neuropathy ([place])   . Tobacco dependence   .  Nonspecific elevation of levels of transaminase or lactic acid dehydrogenase (LDH)   . Insomnia    Current Outpatient Prescriptions on File Prior to Visit  Medication Sig Dispense Refill  . albuterol (PROVENTIL HFA;VENTOLIN HFA) 108 (90 BASE) MCG/ACT inhaler Inhale 2 puffs into the lungs every 4 (four) hours as needed for shortness of breath. For shortness of breath    . amLODipine (NORVASC) 10 MG tablet     . atropine 1 % ophthalmic solution Place 1 drop into both eyes 2 (two) times daily.    . buprenorphine-naloxone (SUBOXONE) 2-0.5 MG SUBL SL tablet Place 1 tablet under the tongue 3 (three) times daily.     . Cholecalciferol (VITAMIN D3) 1200 UNIT/15ML LIQD Take 15 mLs by mouth daily.    . clonazePAM (KLONOPIN) 1 MG tablet Take 1 mg by mouth 3 (three) times daily as needed for anxiety.    . furosemide (LASIX) 20 MG tablet Take 20 mg by mouth daily.    . Gabapentin, PHN, 600 MG TABS Take 600 mg by mouth 3 (three) times daily. 90 tablet 12  . insulin lispro protamine-lispro (HUMALOG 75/25 MIX) (75-25) 100 UNIT/ML SUSP injection Inject 55 Units into the skin 2 (two) times daily before a meal.    . ketotifen (ZADITOR) 0.025 % ophthalmic solution Place 1 drop into both eyes  2 (two) times daily. 5 mL 0  . Ledipasvir-Sofosbuvir (HARVONI) 90-400 MG TABS Take 1 tablet by mouth daily. 30 tablet 2  . levothyroxine (SYNTHROID, LEVOTHROID) 50 MCG tablet Take 50 mcg by mouth daily before breakfast.    . lisinopril (PRINIVIL,ZESTRIL) 40 MG tablet Take 40 mg by mouth every morning.     . metFORMIN (GLUCOPHAGE) 500 MG tablet Take 1,000 mg by mouth 2 (two) times daily with a meal.     . methocarbamol (ROBAXIN) 500 MG tablet Take 1 tablet (500 mg total) by mouth 2 (two) times daily. 30 tablet 0  . metoCLOPramide (REGLAN) 10 MG tablet Take 1 tablet (10 mg total) by mouth 4 (four) times daily -  before meals and at bedtime. 120 tablet 3  . omeprazole (PRILOSEC) 20 MG capsule Take 1 capsule (20 mg total) by mouth  daily. Has to be taken at the same time with Harvoni FASTED 30 capsule 11  . oxyCODONE-acetaminophen (PERCOCET) 7.5-325 MG per tablet Take 1 tablet by mouth every 6 (six) hours as needed for pain.    . polyethylene glycol (MIRALAX / GLYCOLAX) packet Take 17 g by mouth 2 (two) times daily as needed for mild constipation.    . potassium chloride (K-DUR) 10 MEQ tablet Take 10 mEq by mouth daily.    . pravastatin (PRAVACHOL) 80 MG tablet     . prednisoLONE acetate (PRED FORTE) 1 % ophthalmic suspension Apply to eye.    . Travoprost, BAK Free, (TRAVATAN) 0.004 % SOLN ophthalmic solution Place 1 drop into both eyes at bedtime.    . traZODone (DESYREL) 150 MG tablet Take 150-300 mg by mouth at bedtime as needed for sleep.     Marland Kitchen trimethoprim-polymyxin b (POLYTRIM) ophthalmic solution Place 1 drop into both eyes every 4 (four) hours. 10 mL 0   No current facility-administered medications on file prior to visit.   Family History  Problem Relation Age of Onset  . Diabetes Father   . High blood pressure Mother   . High blood pressure Father   . Diabetes Mother   . Thyroid disease Mother    Social History   Social History  . Marital Status: Married    Spouse Name: N/A  . Number of Children: 2  . Years of Education: 11   Occupational History  .      Disabled   Social History Main Topics  . Smoking status: Current Every Day Smoker -- 0.25 packs/day    Types: Cigarettes  . Smokeless tobacco: Never Used     Comment: trying to quit  . Alcohol Use: Yes     Comment: wine  . Drug Use: Yes    Special: Marijuana, Cocaine     Comment: quit 10/25/13  . Sexual Activity: Not on file   Other Topics Concern  . Not on file   Social History Narrative   Patient lives at home with he boyfriend.    Disabled.   Right handed.   Education 11 th grade.   Caffeine three cups of coffee daily.    Review of Systems: Constitutional: Negative for fever, chills, appetite change, weight loss, fatigue.   Skin: Negative for rashes or lesions of concern. HENT: Negative for ear pain, ear discharge.nose bleeds Eyes: Positive for glaucoma on left and cataracts on left. She is legally blind.  Neck: Negative for pain, stiffness Respiratory: Negative for cough, shortness of breath,   Cardiovascular: Negative for chest pain, palpitations. Positive for pedal edema Gastrointestinal: Negative for  abdominal pain, nausea, vomiting, diarrhea. Positive for constipation, GERD and gastroperesis Genitourinary: Negative for dysuria, urgency, frequency, hematuria,  Musculoskeletal: Negative for back pain, joint pain, joint  swelling, and gait problem.Negative for weakness. Neurological: Negative for dizziness, tremors, seizures, syncope,   light-headedness. Positive for burning and pain in hands and feet Hematological: Negative for easy bruising or bleeding Psychiatric/Behavioral: Negative for depression, anxiety, decreased concentration, confusion   Objective:   Filed Vitals:   04/30/16 1024  BP: 199/104  Pulse: 93  Temp: 98.3 F (36.8 C)  Resp: 18    Physical Exam: Constitutional: Patient appears well-developed and well-nourished. No distress. HENT: Normocephalic, atraumatic, External right and left ear normal. Oropharynx is clear and moist.  Eyes: Conjunctivae and EOM are normal. PERRLA, no scleral icterus. Neck: Normal ROM. Neck supple. No lymphadenopathy, No thyromegaly. CVS: RRR, S1/S2 +, no murmurs, no gallops, no rubs Pulmonary: Effort and breath sounds normal, no stridor, rhonchi, wheezes, rales.  Abdominal: Soft. Normoactive BS,, no distension, tenderness, rebound or guarding.  Musculoskeletal: Normal range of motion. No edema and no tenderness.  Neuro: Alert.Normal muscle tone coordination. Non-focal Skin: Skin is warm and dry. No rash noted. Not diaphoretic. No erythema. No pallor. Psychiatric: Normal mood and affect. Behavior, judgment, thought content normal.  Lab Results  Component  Value Date   WBC 5.9 06/27/2014   HGB 14.6 06/27/2014   HCT 43.6 06/27/2014   MCV 86.9 06/27/2014   PLT 306 06/27/2014   Lab Results  Component Value Date   CREATININE 1.44* 06/27/2014   BUN 27* 06/27/2014   NA 138 06/27/2014   K 4.7 06/27/2014   CL 96 06/27/2014   CO2 29 06/27/2014    No results found for: HGBA1C Lipid Panel  No results found for: CHOL, TRIG, HDL, CHOLHDL, VLDL, LDLCALC     Assessment and plan:   1. Diabetes mellitus of other type with complication ([place])  - COMPLETE METABOLIC PANEL WITH GFR - CBC with Differential - Lipid panel - TSH - Microalbumin, urine - Ambulatory referral to Ophthalmology  2. Glaucoma and cataracts, legally blind. -referral to opthalmology.  3. Hypertension, uncontrolled. - Clonodine 0.1 mg. Now. 178/100 afterward -Atenolol 25 mg, One po bid.    One month for follow-up and bp check.  The patient was given clear instructions to go to ER or return to medical center if symptoms don't improve, worsen or new problems develop. The patient verbalized understanding.    Micheline Chapman FNP  04/30/2016, 11:36 AM

## 2016-04-30 NOTE — Patient Instructions (Addendum)
Follow-up one month for BP check. Someone will call you about your appointment with eye doctor.

## 2016-04-30 NOTE — Progress Notes (Signed)
Patient is here to Establish Care.  Patient denies pain at this time.  Patient has taken medication today and patient has eaten.  Patient states she has high pressure behind her eyes.

## 2016-04-30 NOTE — Telephone Encounter (Signed)
Patients prescription printed and provided to the patient.

## 2016-05-01 LAB — COMPLETE METABOLIC PANEL WITH GFR
ALBUMIN: 3.5 g/dL — AB (ref 3.6–5.1)
ALT: 13 U/L (ref 6–29)
AST: 17 U/L (ref 10–35)
Alkaline Phosphatase: 66 U/L (ref 33–130)
BILIRUBIN TOTAL: 0.2 mg/dL (ref 0.2–1.2)
BUN: 13 mg/dL (ref 7–25)
CALCIUM: 9.3 mg/dL (ref 8.6–10.4)
CO2: 26 mmol/L (ref 20–31)
CREATININE: 1.55 mg/dL — AB (ref 0.50–1.05)
Chloride: 103 mmol/L (ref 98–110)
GFR, EST AFRICAN AMERICAN: 44 mL/min — AB (ref >=60)
GFR, Est Non African American: 38 mL/min — ABNORMAL LOW (ref >=60)
Glucose, Bld: 156 mg/dL — ABNORMAL HIGH (ref 65–99)
Potassium: 4.7 mmol/L (ref 3.5–5.3)
Sodium: 141 mmol/L (ref 135–146)
TOTAL PROTEIN: 7.2 g/dL (ref 6.1–8.1)

## 2016-05-01 LAB — LIPID PANEL
Cholesterol: 303 mg/dL — ABNORMAL HIGH (ref 125–200)
HDL: 81 mg/dL (ref >=46)
LDL CALC: 183 mg/dL — AB (ref <130)
Total CHOL/HDL Ratio: 3.7 Ratio (ref <=5.0)
Triglycerides: 193 mg/dL — ABNORMAL HIGH (ref <150)
VLDL: 39 mg/dL — AB (ref <30)

## 2016-05-01 LAB — MICROALBUMIN, URINE: Microalb, Ur: >600 mg/dL

## 2016-05-03 ENCOUNTER — Other Ambulatory Visit: Payer: Self-pay | Admitting: Family Medicine

## 2016-05-03 DIAGNOSIS — Z1231 Encounter for screening mammogram for malignant neoplasm of breast: Secondary | ICD-10-CM

## 2016-05-03 LAB — HEMOGLOBIN A1C
Hgb A1c MFr Bld: 7.4 % — ABNORMAL HIGH (ref <5.7)
MEAN PLASMA GLUCOSE: 166 mg/dL

## 2016-05-06 ENCOUNTER — Ambulatory Visit: Payer: Medicare Other

## 2016-05-18 ENCOUNTER — Other Ambulatory Visit: Payer: Self-pay | Admitting: Family Medicine

## 2016-05-18 MED ORDER — GABAPENTIN 300 MG PO CAPS: ORAL_CAPSULE | ORAL | 1 refills | Status: DC

## 2016-05-18 MED ORDER — GLIMEPIRIDE 4 MG PO TABS: 4.0000 mg | ORAL_TABLET | Freq: Two times a day (BID) | ORAL | 3 refills | Status: DC

## 2016-05-31 ENCOUNTER — Ambulatory Visit: Payer: Medicare Other | Admitting: Family Medicine

## 2016-06-08 ENCOUNTER — Emergency Department (HOSPITAL_COMMUNITY): Payer: Medicare Other

## 2016-06-08 ENCOUNTER — Emergency Department (HOSPITAL_COMMUNITY)
Admission: EM | Admit: 2016-06-08 | Discharge: 2016-06-08 | Disposition: A | Discharge status: Home / Self Care | Payer: Medicare Other | Attending: Emergency Medicine | Admitting: Emergency Medicine

## 2016-06-08 ENCOUNTER — Encounter (HOSPITAL_COMMUNITY): Payer: Self-pay | Admitting: Emergency Medicine

## 2016-06-08 DIAGNOSIS — Z794 Long term (current) use of insulin: Secondary | ICD-10-CM | POA: Diagnosis not present

## 2016-06-08 DIAGNOSIS — I10 Essential (primary) hypertension: Secondary | ICD-10-CM | POA: Insufficient documentation

## 2016-06-08 DIAGNOSIS — E11319 Type 2 diabetes mellitus with unspecified diabetic retinopathy without macular edema: Secondary | ICD-10-CM | POA: Diagnosis not present

## 2016-06-08 DIAGNOSIS — Z7984 Long term (current) use of oral hypoglycemic drugs: Secondary | ICD-10-CM | POA: Insufficient documentation

## 2016-06-08 DIAGNOSIS — S199XXA Unspecified injury of neck, initial encounter: Secondary | ICD-10-CM | POA: Diagnosis present

## 2016-06-08 DIAGNOSIS — E1140 Type 2 diabetes mellitus with diabetic neuropathy, unspecified: Secondary | ICD-10-CM | POA: Insufficient documentation

## 2016-06-08 DIAGNOSIS — F17210 Nicotine dependence, cigarettes, uncomplicated: Secondary | ICD-10-CM | POA: Insufficient documentation

## 2016-06-08 DIAGNOSIS — Z79899 Other long term (current) drug therapy: Secondary | ICD-10-CM | POA: Diagnosis not present

## 2016-06-08 DIAGNOSIS — S161XXA Strain of muscle, fascia and tendon at neck level, initial encounter: Secondary | ICD-10-CM | POA: Insufficient documentation

## 2016-06-08 DIAGNOSIS — S0990XA Unspecified injury of head, initial encounter: Secondary | ICD-10-CM | POA: Diagnosis not present

## 2016-06-08 DIAGNOSIS — Y999 Unspecified external cause status: Secondary | ICD-10-CM | POA: Insufficient documentation

## 2016-06-08 DIAGNOSIS — Y939 Activity, unspecified: Secondary | ICD-10-CM | POA: Diagnosis not present

## 2016-06-08 DIAGNOSIS — Y929 Unspecified place or not applicable: Secondary | ICD-10-CM | POA: Diagnosis not present

## 2016-06-08 MED ORDER — FUROSEMIDE 20 MG PO TABS
20.0000 mg | ORAL_TABLET | Freq: Once | ORAL | Status: AC
  Administered 2016-06-08: 20 mg via ORAL
  Filled 2016-06-08: qty 1

## 2016-06-08 MED ORDER — LABETALOL HCL 200 MG PO TABS
100.0000 mg | ORAL_TABLET | Freq: Once | ORAL | Status: AC
  Administered 2016-06-08: 100 mg via ORAL
  Filled 2016-06-08: qty 1

## 2016-06-08 NOTE — ED Notes (Signed)
Pt being taken to [facility] with officer at this time.

## 2016-06-08 NOTE — ED Notes (Signed)
Pt elevated BP relayed to EDP

## 2016-06-08 NOTE — Discharge Instructions (Signed)
Ibuprofen 600 mg rotated with Tylenol 1000 g every 4 hours as needed for pain.  Return to the emergency department if you develop worsening headache, difficulty breathing, or other new and concerning symptoms.

## 2016-06-08 NOTE — ED Triage Notes (Signed)
Pt. added bilateral eye pain and posterior neck pain . C- collar applied at triage .

## 2016-06-08 NOTE — ED Notes (Signed)
Pt requested to receive Gabapentin and Lasix. MD Zavitz made aware. MD ordered lasix at this this time.
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
## 2016-06-08 NOTE — ED Notes (Signed)
Pt to CT

## 2016-06-08 NOTE — ED Notes (Signed)
[employer] at bedside 

## 2016-07-15 ENCOUNTER — Other Ambulatory Visit: Payer: Self-pay

## 2016-07-15 MED ORDER — PANTOPRAZOLE SODIUM 40 MG PO TBEC: 40.0000 mg | DELAYED_RELEASE_TABLET | Freq: Every day | ORAL | 3 refills | Status: DC

## 2016-07-15 NOTE — Telephone Encounter (Signed)
Refill for pantoprazole sent into pharmacy. Thanks!

## 2016-07-16 ENCOUNTER — Ambulatory Visit: Payer: Medicare Other | Admitting: Family Medicine

## 2016-08-10 ENCOUNTER — Ambulatory Visit: Payer: Medicare Other | Admitting: Family Medicine

## 2016-08-16 ENCOUNTER — Other Ambulatory Visit: Payer: Self-pay

## 2016-08-16 MED ORDER — GABAPENTIN 300 MG PO CAPS: ORAL_CAPSULE | ORAL | 1 refills | Status: DC

## 2016-08-16 NOTE — Telephone Encounter (Signed)
Refill for gabapentin sent into pharmacy. Thanks!  

## 2016-09-07 ENCOUNTER — Ambulatory Visit (INDEPENDENT_AMBULATORY_CARE_PROVIDER_SITE_OTHER): Payer: Medicare Other | Admitting: Family Medicine

## 2016-09-07 VITALS — BP 186/96 | HR 97 | Temp 98.4°F | Resp 16 | Ht 61.0 in | Wt 132.0 lb

## 2016-09-07 DIAGNOSIS — Z1231 Encounter for screening mammogram for malignant neoplasm of breast: Secondary | ICD-10-CM

## 2016-09-07 DIAGNOSIS — Z23 Encounter for immunization: Secondary | ICD-10-CM | POA: Diagnosis not present

## 2016-09-07 DIAGNOSIS — E118 Type 2 diabetes mellitus with unspecified complications: Secondary | ICD-10-CM

## 2016-09-07 DIAGNOSIS — Z1239 Encounter for other screening for malignant neoplasm of breast: Secondary | ICD-10-CM

## 2016-09-07 LAB — CBC WITH DIFFERENTIAL/PLATELET
BASOS PCT: 1 %
Basophils Absolute: 94 cells/uL (ref 0–200)
EOS ABS: 188 {cells}/uL (ref 15–500)
EOS PCT: 2 %
HCT: 41.1 % (ref 35.0–45.0)
Hemoglobin: 13.1 g/dL (ref 11.7–15.5)
LYMPHS ABS: 2820 {cells}/uL (ref 850–3900)
Lymphocytes Relative: 30 %
MCH: 28.3 pg (ref 27.0–33.0)
MCHC: 31.9 g/dL — ABNORMAL LOW (ref 32.0–36.0)
MCV: 88.8 fL (ref 80.0–100.0)
MONOS PCT: 6 %
MPV: 11.1 fL (ref 7.5–12.5)
Monocytes Absolute: 564 cells/uL (ref 200–950)
NEUTROS ABS: 5734 {cells}/uL (ref 1500–7800)
Neutrophils Relative %: 61 %
PLATELETS: 288 10*3/uL (ref 140–400)
RBC: 4.63 MIL/uL (ref 3.80–5.10)
RDW: 14.9 % (ref 11.0–15.0)
WBC: 9.4 10*3/uL (ref 3.8–10.8)

## 2016-09-07 LAB — COMPLETE METABOLIC PANEL WITH GFR
ALT: 12 U/L (ref 6–29)
AST: 23 U/L (ref 10–35)
Albumin: 3.4 g/dL — ABNORMAL LOW (ref 3.6–5.1)
Alkaline Phosphatase: 74 U/L (ref 33–130)
BILIRUBIN TOTAL: 0.3 mg/dL (ref 0.2–1.2)
BUN: 29 mg/dL — ABNORMAL HIGH (ref 7–25)
CO2: 25 mmol/L (ref 20–31)
Calcium: 9 mg/dL (ref 8.6–10.4)
Chloride: 106 mmol/L (ref 98–110)
Creat: 2.49 mg/dL — ABNORMAL HIGH (ref 0.50–1.05)
GFR, EST AFRICAN AMERICAN: 25 mL/min — AB (ref >=60)
GFR, EST NON AFRICAN AMERICAN: 22 mL/min — AB (ref >=60)
Glucose, Bld: 184 mg/dL — ABNORMAL HIGH (ref 65–99)
POTASSIUM: 4.4 mmol/L (ref 3.5–5.3)
Sodium: 139 mmol/L (ref 135–146)
TOTAL PROTEIN: 6.9 g/dL (ref 6.1–8.1)

## 2016-09-07 LAB — LIPID PANEL
CHOL/HDL RATIO: 3.9 ratio (ref <5.0)
CHOLESTEROL: 339 mg/dL — AB (ref <200)
HDL: 87 mg/dL (ref >50)
LDL Cholesterol: 213 mg/dL — ABNORMAL HIGH (ref <100)
Triglycerides: 193 mg/dL — ABNORMAL HIGH (ref <150)
VLDL: 39 mg/dL — ABNORMAL HIGH (ref <30)

## 2016-09-07 LAB — HEMOGLOBIN A1C
HEMOGLOBIN A1C: 6.7 % — AB (ref <5.7)
MEAN PLASMA GLUCOSE: 146 mg/dL

## 2016-09-07 MED ORDER — LISINOPRIL 40 MG PO TABS: 40.0000 mg | ORAL_TABLET | Freq: Every morning | ORAL | 1 refills | Status: DC

## 2016-09-07 MED ORDER — GLIMEPIRIDE 4 MG PO TABS: 4.0000 mg | ORAL_TABLET | Freq: Two times a day (BID) | ORAL | 3 refills | Status: DC

## 2016-09-07 MED ORDER — GABAPENTIN (ONCE-DAILY) 600 MG PO TABS
600.0000 mg | ORAL_TABLET | Freq: Three times a day (TID) | ORAL | 12 refills | Status: DC
Start: 2016-09-07 — End: 2017-12-13

## 2016-09-07 MED ORDER — PNEUMOCOCCAL 13-VAL CONJ VACC IM SUSP: 0.5000 mL | INTRAMUSCULAR | Status: DC | PRN

## 2016-09-07 MED ORDER — INSULIN LISPRO PROT & LISPRO (75-25 MIX) 100 UNIT/ML ~~LOC~~ SUSP: 55.0000 [IU] | Freq: Two times a day (BID) | SUBCUTANEOUS | 1 refills | Status: DC

## 2016-09-07 MED ORDER — FUROSEMIDE 20 MG PO TABS: 20.0000 mg | ORAL_TABLET | Freq: Every day | ORAL | 1 refills | Status: DC

## 2016-09-07 MED ORDER — ATENOLOL 25 MG PO TABS: 25.0000 mg | ORAL_TABLET | Freq: Every day | ORAL | 1 refills | Status: DC

## 2016-09-08 LAB — MICROALBUMIN, URINE: Microalb, Ur: 0.7 mg/dL

## 2016-09-08 NOTE — Progress Notes (Signed)
Tammy Moses, is a 50 y.o. female  [id number]  [id number]  DOB - [birth year]  CC:  Chief Complaint  Patient presents with  . Hypertension  . Follow-up    asking for referral to [place] neurology        HPI: Tammy Moses is a 50 y.o. female here for follow-up. She has a diagnosis of anemia, anxiety, right eye blindness, DM II. Diabetic neuropathy. GERD, Hypertension. She is on numerous medications, which are in her mediction list and have been reviewed. She reports not needing refills today.  She does request a referral to neurology. She does see an eye doctor about her right eye. She is followed at pain clinic   Health maintenance: She is to receive flu shot today.She has been screened for HIV. Has been treated for Hep C.  Will offer pneumonia at next visit. Declines Tdap. No Known Allergies Past Medical History:  Diagnosis Date  . Anemia   . Anxiety   . Blind right eye   . Constipation, chronic   . Dental caries   . Diabetes mellitus   . Diabetic neuropathy ([place])   . Diabetic retinopathy   . GERD (gastroesophageal reflux disease)   . Glaucoma   . H. pylori infection   . Hepatitis C carrier ([place])   . High risk sexual behavior   . Hyperlipidemia   . Hypertension   . Insomnia   . Microalbuminuria   . Nonspecific elevation of levels of transaminase or lactic acid dehydrogenase (LDH)   . Tobacco dependence   . Vitamin D deficiency    Current Outpatient Prescriptions on File Prior to Visit  Medication Sig Dispense Refill  . albuterol (PROVENTIL HFA;VENTOLIN HFA) 108 (90 BASE) MCG/ACT inhaler Inhale 2 puffs into the lungs every 4 (four) hours as needed for shortness of breath. For shortness of breath    . amLODipine (NORVASC) 10 MG tablet Take 10 mg by mouth daily.     Marland Kitchen atropine 1 % ophthalmic solution Place 1 drop into both eyes 2 (two) times daily.    . buprenorphine-naloxone (SUBOXONE) 2-0.5 MG SUBL SL tablet Place 1 tablet under the tongue 3 (three) times  daily.     . Cholecalciferol (VITAMIN D3) 1200 UNIT/15ML LIQD Take 15 mLs by mouth daily.    . clonazePAM (KLONOPIN) 1 MG tablet Take 1 mg by mouth 3 (three) times daily as needed for anxiety.    . gabapentin (NEURONTIN) 300 MG capsule Take  2 tablets twice a day 180 capsule 1  . glucose blood (ONE TOUCH ULTRA TEST) test strip Use as instructed 100 each 12  . ketotifen (ZADITOR) 0.025 % ophthalmic solution Place 1 drop into both eyes 2 (two) times daily. (Patient not taking: Reported on 06/08/2016) 5 mL 0  . Lancets (ONETOUCH ULTRASOFT) lancets Use as instructed 100 each 12  . Ledipasvir-Sofosbuvir (HARVONI) 90-400 MG TABS Take 1 tablet by mouth daily. 30 tablet 2  . levothyroxine (SYNTHROID, LEVOTHROID) 50 MCG tablet Take 50 mcg by mouth daily before breakfast.    . metFORMIN (GLUCOPHAGE) 500 MG tablet Take 1,000 mg by mouth 2 (two) times daily with a meal.     . methocarbamol (ROBAXIN) 500 MG tablet Take 1 tablet (500 mg total) by mouth 2 (two) times daily. 30 tablet 0  . metoCLOPramide (REGLAN) 10 MG tablet Take 1 tablet (10 mg total) by mouth 4 (four) times daily -  before meals and at bedtime. 120 tablet 3  . omeprazole (PRILOSEC) 20 MG  capsule Take 1 capsule (20 mg total) by mouth daily. Has to be taken at the same time with Harvoni FASTED 30 capsule 11  . oxyCODONE-acetaminophen (PERCOCET) 7.5-325 MG per tablet Take 1 tablet by mouth every 6 (six) hours as needed for pain.    . pantoprazole (PROTONIX) 40 MG tablet Take 1 tablet (40 mg total) by mouth daily. 30 tablet 3  . polyethylene glycol (MIRALAX / GLYCOLAX) packet Take 17 g by mouth 2 (two) times daily as needed for mild constipation.    . potassium chloride (K-DUR) 10 MEQ tablet Take 10 mEq by mouth daily.    . pravastatin (PRAVACHOL) 80 MG tablet Take 80 mg by mouth daily.     . Travoprost, BAK Free, (TRAVATAN) 0.004 % SOLN ophthalmic solution Place 1 drop into both eyes at bedtime.    . traZODone (DESYREL) 150 MG tablet Take 150-300  mg by mouth at bedtime as needed for sleep.     Marland Kitchen trimethoprim-polymyxin b (POLYTRIM) ophthalmic solution Place 1 drop into both eyes every 4 (four) hours. (Patient not taking: Reported on 06/08/2016) 10 mL 0   No current facility-administered medications on file prior to visit.    Family History  Problem Relation Age of Onset  . High blood pressure Mother   . Diabetes Mother   . Thyroid disease Mother   . Diabetes Father   . High blood pressure Father    Social History   Social History  . Marital status: Married    Spouse name: N/A  . Number of children: 2  . Years of education: 11   Occupational History  .  Disabled    Disabled   Social History Main Topics  . Smoking status: Current Every Day Smoker    Packs/day: 0.25    Types: Cigarettes  . Smokeless tobacco: Never Used     Comment: trying to quit  . Alcohol use Yes     Comment: wine  . Drug use:     Types: Marijuana, Cocaine     Comment: quit 10/25/13  . Sexual activity: Not on file   Other Topics Concern  . Not on file   Social History Narrative   Patient lives at home with he boyfriend.    Disabled.   Right handed.   Education 11 th grade.   Caffeine three cups of coffee daily.    Review of Systems: Constitutional: + fatigue Skin: Negative HENT: Negative  Eyes: Blind in right eye, with some drainage. Neck: Negative Respiratory: Negative Cardiovascular: Negative Gastrointestinal: + occ abd pain, constipation, diarrhea Genitourinary: Negative  Musculoskeletal: + low back pain  Neurological: + for headaches Hematological: Negative  Psychiatric/Behavioral: +depression and anxiety  Objective:   Vitals:   09/07/16 1153  BP: (!) 186/96  Pulse: 97  Resp: 16  Temp: 98.4 F (36.9 C)    Physical Exam: Constitutional: Patient appears well-developed and well-nourished. No distress. HENT: Normocephalic, atraumatic, External right and left ear normal. Oropharynx is clear and moist.  Eyes: Conjunctivae  and EOM are normal. PERRLA, no scleral icterus. Neck: Normal ROM. Neck supple. No lymphadenopathy, No thyromegaly. CVS: RRR, S1/S2 +, no murmurs, no gallops, no rubs Pulmonary: Effort and breath sounds normal, no stridor, rhonchi, wheezes, rales.  Abdominal: Soft. Normoactive BS,, no distension, tenderness, rebound or guarding.  Musculoskeletal: Normal range of motion. No edema and no tenderness.  Neuro: Alert.Normal muscle tone coordination. Non-focal Skin: Skin is warm and dry. No rash noted. Not diaphoretic. No erythema. No pallor. Psychiatric:  Normal mood and affect. Behavior, judgment, thought content normal.  Lab Results  Component Value Date   WBC 9.4 09/07/2016   HGB 13.1 09/07/2016   HCT 41.1 09/07/2016   MCV 88.8 09/07/2016   PLT 288 09/07/2016   Lab Results  Component Value Date   CREATININE 2.49 (H) 09/07/2016   BUN 29 (H) 09/07/2016   NA 139 09/07/2016   K 4.4 09/07/2016   CL 106 09/07/2016   CO2 25 09/07/2016    Lab Results  Component Value Date   HGBA1C 6.7 (H) 09/07/2016   Lipid Panel     Component Value Date/Time   CHOL 339 (H) 09/07/2016 1226   TRIG 193 (H) 09/07/2016 1226   HDL 87 09/07/2016 1226   CHOLHDL 3.9 09/07/2016 1226   VLDL 39 (H) 09/07/2016 1226   LDLCALC 213 (H) 09/07/2016 1226        Assessment and plan:   1. Need for prophylactic vaccination and inoculation against influenza  - Flu Vaccine QUAD 36+ mos PF IM (Fluarix & Fluzone Quad PF)   3. Type 2 diabetes mellitus with complication, unspecified long term insulin use status (HCC)  - Hemoglobin A1c - COMPLETE METABOLIC PANEL WITH GFR - CBC with Differential - Microalbumin, urine - Lipid panel  4. Screening for breast cancer  - MM DIGITAL SCREENING BILATERAL; Future   Return in about 3 months (around 12/08/2016).  The patient was given clear instructions to go to ER or return to medical center if symptoms don't improve, worsen or new problems develop. The patient  verbalized understanding.    Micheline Chapman FNP  09/08/2016, 11:59 AM

## 2016-09-10 ENCOUNTER — Telehealth: Payer: Self-pay

## 2016-09-10 NOTE — Telephone Encounter (Signed)
Patient is needing an eye exam and asked to be referred to [facility]. I called their office [phone] and spoke to them about referral. They only accept [employer] for eye exams and Ms. Krist does not have this type of insurance. I have tried to call her back but all numbers in chart are not in service. Thanks!

## 2016-09-30 ENCOUNTER — Ambulatory Visit: Payer: Medicare Other

## 2016-10-04 ENCOUNTER — Other Ambulatory Visit: Payer: Self-pay | Admitting: Family Medicine

## 2016-10-04 ENCOUNTER — Telehealth: Payer: Self-pay | Admitting: Family Medicine

## 2016-10-04 NOTE — Telephone Encounter (Signed)
Patient calls with several concerns today. She is requesting referral to Neurology for neuropathy and Pain clinic for back and leg pain. She is requesting insulin pen instead of vial because she is visually challenged. Do you want her to continue her glimepride with insulin?

## 2016-10-11 ENCOUNTER — Other Ambulatory Visit: Payer: Self-pay | Admitting: Family Medicine

## 2016-10-11 DIAGNOSIS — G609 Hereditary and idiopathic neuropathy, unspecified: Secondary | ICD-10-CM

## 2016-10-11 MED ORDER — INSULIN LISPRO 100 UNIT/ML (KWIKPEN): 55.0000 [IU] | PEN_INJECTOR | Freq: Two times a day (BID) | SUBCUTANEOUS | 11 refills | Status: DC

## 2016-10-11 MED ORDER — INSULIN LISPRO PROT & LISPRO (75-25 MIX) 100 UNIT/ML KWIKPEN: 55.0000 [IU] | PEN_INJECTOR | Freq: Two times a day (BID) | SUBCUTANEOUS | 11 refills | Status: DC

## 2016-11-03 ENCOUNTER — Other Ambulatory Visit: Payer: Self-pay

## 2016-11-08 ENCOUNTER — Other Ambulatory Visit: Payer: Self-pay

## 2016-11-29 ENCOUNTER — Other Ambulatory Visit: Payer: Self-pay | Admitting: Family Medicine

## 2016-11-29 MED ORDER — GLIMEPIRIDE 4 MG PO TABS: 4.0000 mg | ORAL_TABLET | Freq: Two times a day (BID) | ORAL | 3 refills | Status: DC

## 2016-12-08 ENCOUNTER — Other Ambulatory Visit: Payer: Self-pay | Admitting: Family Medicine

## 2016-12-08 ENCOUNTER — Ambulatory Visit: Payer: Medicare Other | Admitting: Family Medicine

## 2016-12-20 ENCOUNTER — Other Ambulatory Visit: Payer: Self-pay | Admitting: Family Medicine

## 2016-12-20 ENCOUNTER — Ambulatory Visit: Payer: Medicare Other | Admitting: Family Medicine

## 2016-12-27 DIAGNOSIS — H26492 Other secondary cataract, left eye: Secondary | ICD-10-CM | POA: Insufficient documentation

## 2016-12-27 DIAGNOSIS — H409 Unspecified glaucoma: Secondary | ICD-10-CM | POA: Insufficient documentation

## 2016-12-27 DIAGNOSIS — H402223 Chronic angle-closure glaucoma, left eye, severe stage: Secondary | ICD-10-CM | POA: Insufficient documentation

## 2016-12-27 DIAGNOSIS — H44511 Absolute glaucoma, right eye: Secondary | ICD-10-CM | POA: Insufficient documentation

## 2017-02-25 ENCOUNTER — Ambulatory Visit: Payer: Medicare Other

## 2017-03-11 ENCOUNTER — Ambulatory Visit (INDEPENDENT_AMBULATORY_CARE_PROVIDER_SITE_OTHER): Payer: Medicare Other | Admitting: Family Medicine

## 2017-03-11 ENCOUNTER — Encounter: Payer: Self-pay | Admitting: Family Medicine

## 2017-03-11 VITALS — BP 146/88 | HR 78 | Temp 98.9°F | Resp 14 | Ht 61.0 in | Wt 141.0 lb

## 2017-03-11 DIAGNOSIS — F4321 Adjustment disorder with depressed mood: Secondary | ICD-10-CM

## 2017-03-11 DIAGNOSIS — E785 Hyperlipidemia, unspecified: Secondary | ICD-10-CM

## 2017-03-11 DIAGNOSIS — N184 Chronic kidney disease, stage 4 (severe): Secondary | ICD-10-CM

## 2017-03-11 DIAGNOSIS — R51 Headache: Secondary | ICD-10-CM | POA: Diagnosis not present

## 2017-03-11 DIAGNOSIS — E119 Type 2 diabetes mellitus without complications: Secondary | ICD-10-CM

## 2017-03-11 DIAGNOSIS — R011 Cardiac murmur, unspecified: Secondary | ICD-10-CM

## 2017-03-11 DIAGNOSIS — I10 Essential (primary) hypertension: Secondary | ICD-10-CM | POA: Diagnosis not present

## 2017-03-11 DIAGNOSIS — R519 Headache, unspecified: Secondary | ICD-10-CM

## 2017-03-11 LAB — CBC WITH DIFFERENTIAL/PLATELET
BASOS PCT: 1 %
Basophils Absolute: 79 cells/uL (ref 0–200)
EOS ABS: 237 {cells}/uL (ref 15–500)
Eosinophils Relative: 3 %
HEMATOCRIT: 40.8 % (ref 35.0–45.0)
Hemoglobin: 12.7 g/dL (ref 11.7–15.5)
Lymphocytes Relative: 40 %
Lymphs Abs: 3160 cells/uL (ref 850–3900)
MCH: 28.1 pg (ref 27.0–33.0)
MCHC: 31.1 g/dL — ABNORMAL LOW (ref 32.0–36.0)
MCV: 90.3 fL (ref 80.0–100.0)
MONO ABS: 553 {cells}/uL (ref 200–950)
MPV: 11.4 fL (ref 7.5–12.5)
Monocytes Relative: 7 %
NEUTROS ABS: 3871 {cells}/uL (ref 1500–7800)
Neutrophils Relative %: 49 %
Platelets: 285 10*3/uL (ref 140–400)
RBC: 4.52 MIL/uL (ref 3.80–5.10)
RDW: 15.1 % — ABNORMAL HIGH (ref 11.0–15.0)
WBC: 7.9 10*3/uL (ref 3.8–10.8)

## 2017-03-11 LAB — COMPLETE METABOLIC PANEL WITH GFR
ALT: 11 U/L (ref 6–29)
AST: 16 U/L (ref 10–35)
Albumin: 3.4 g/dL — ABNORMAL LOW (ref 3.6–5.1)
Alkaline Phosphatase: 70 U/L (ref 33–130)
BUN: 36 mg/dL — ABNORMAL HIGH (ref 7–25)
CHLORIDE: 109 mmol/L (ref 98–110)
CO2: 23 mmol/L (ref 20–31)
CREATININE: 3.4 mg/dL — AB (ref 0.50–1.05)
Calcium: 8.7 mg/dL (ref 8.6–10.4)
GFR, EST AFRICAN AMERICAN: 17 mL/min — AB (ref >=60)
GFR, Est Non African American: 15 mL/min — ABNORMAL LOW (ref >=60)
Glucose, Bld: 138 mg/dL — ABNORMAL HIGH (ref 65–99)
Potassium: 5 mmol/L (ref 3.5–5.3)
SODIUM: 142 mmol/L (ref 135–146)
Total Bilirubin: 0.2 mg/dL (ref 0.2–1.2)
Total Protein: 6.5 g/dL (ref 6.1–8.1)

## 2017-03-11 LAB — GLUCOSE, CAPILLARY: GLUCOSE-CAPILLARY: 131 mg/dL — AB (ref 65–99)

## 2017-03-11 LAB — POCT GLYCOSYLATED HEMOGLOBIN (HGB A1C): HEMOGLOBIN A1C: 6.8

## 2017-03-11 MED ORDER — SAXAGLIPTIN HCL 2.5 MG PO TABS: 2.5000 mg | ORAL_TABLET | Freq: Every day | ORAL | 0 refills | Status: DC

## 2017-03-11 MED ORDER — OMEPRAZOLE 20 MG PO CPDR: 20.0000 mg | DELAYED_RELEASE_CAPSULE | Freq: Every day | ORAL | 11 refills | Status: DC

## 2017-03-11 MED ORDER — CLONIDINE HCL 0.1 MG PO TABS
0.2000 mg | ORAL_TABLET | Freq: Once | ORAL | Status: AC
  Administered 2017-03-11: 0.2 mg via ORAL

## 2017-03-11 MED ORDER — BUPROPION HCL ER (SR) 150 MG PO TB12: 150.0000 mg | ORAL_TABLET | Freq: Two times a day (BID) | ORAL | 2 refills | Status: DC

## 2017-03-11 MED ORDER — CLONIDINE HCL 0.2 MG/24HR TD PTWK: 0.2000 mg | MEDICATED_PATCH | TRANSDERMAL | 12 refills | Status: DC

## 2017-03-11 MED ORDER — ATENOLOL 25 MG PO TABS: 25.0000 mg | ORAL_TABLET | Freq: Every day | ORAL | 1 refills | Status: DC

## 2017-03-11 MED ORDER — INSULIN GLARGINE 100 UNIT/ML SOLOSTAR PEN: 50.0000 [IU] | PEN_INJECTOR | Freq: Every day | SUBCUTANEOUS | 99 refills | Status: DC

## 2017-03-11 MED ORDER — GABAPENTIN 300 MG PO CAPS: 600.0000 mg | ORAL_CAPSULE | Freq: Three times a day (TID) | ORAL | 3 refills | Status: DC

## 2017-03-11 MED ORDER — INSULIN PEN NEEDLE 29G X 12MM MISC: 3 refills | Status: DC

## 2017-03-11 MED ORDER — PRAVASTATIN SODIUM 80 MG PO TABS: 80.0000 mg | ORAL_TABLET | Freq: Every day | ORAL | 1 refills | Status: DC

## 2017-03-11 MED ORDER — LISINOPRIL 40 MG PO TABS: 40.0000 mg | ORAL_TABLET | Freq: Every morning | ORAL | 1 refills | Status: DC

## 2017-03-11 NOTE — Patient Instructions (Addendum)
Diabetes  Start Lantus 50 units at bedtime nightly.  Take oral Onglyaza 2.5 mg daily. Discontinue Glimepiride and Novolin.  Hypertension  Start on Clonidine 0.2 patch one weekly Continue Lisinopril 40 mg Continue Atenolol 25 mg daily   Depression and Anxiety Wellbutrin 150 mg twice daily   I will mail your paperwork next week. We will schedule you a follow-up with [facility], [facility], and [facility].  If you begin experiencing any dizziness, severe headaches, chest pain, or shortness of breath, report to the nearest emergency department.   Coping with Quitting Smoking Quitting smoking is a physical and mental challenge. You will face cravings, withdrawal symptoms, and temptation. Before quitting, work with your health care provider to make a plan that can help you cope. Preparation can help you quit and keep you from giving in. How can I cope with cravings? Cravings usually last for 5-10 minutes. If you get through it, the craving will pass. Consider taking the following actions to help you cope with cravings:  Keep your mouth busy:  Chew sugar-free gum.  Suck on hard candies or a straw.  Brush your teeth.  Keep your hands and body busy:  Immediately change to a different activity when you feel a craving.  Squeeze or play with a ball.  Do an activity or a hobby, like making bead jewelry, practicing needlepoint, or working with wood.  Mix up your normal routine.  Take a short exercise break. Go for a quick walk or run up and down stairs.  Spend time in public places where smoking is not allowed.  Focus on doing something kind or helpful for someone else.  Call a friend or family member to talk during a craving.  Join a support group.  Call a quit line, such as 1-800-QUIT-NOW.  Talk with your health care provider about medicines that might help you cope with cravings and make quitting easier for you. How can I deal with withdrawal  symptoms? Your body may experience negative effects as it tries to get used to not having nicotine in the system. These effects are called withdrawal symptoms. They may include:  Feeling hungrier than normal.  Trouble concentrating.  Irritability.  Trouble sleeping.  Feeling depressed.  Restlessness and agitation.  Craving a cigarette. 1.  To manage withdrawal symptoms:  Avoid places, people, and activities that trigger your cravings.  Remember why you want to quit.  Get plenty of sleep.  Avoid coffee and other caffeinated drinks. These may worsen some of your symptoms. How can I handle social situations? Social situations can be difficult when you are quitting smoking, especially in the first few weeks. To manage this, you can:  Avoid parties, bars, and other social situations where people might be smoking.  Avoid alcohol.  Leave right away if you have the urge to smoke.  Explain to your family and friends that you are quitting smoking. Ask for understanding and support.  Plan activities with friends or family where smoking is not an option. What are some ways I can cope with stress? Wanting to smoke may cause stress, and stress can make you want to smoke. Find ways to manage your stress. Relaxation techniques can help. For example:  Breathe slowly and deeply, in through your nose and out through your mouth.  Listen to soothing, relaxing music.  Talk with a family member or friend about your stress.  Light a candle.  Soak in a bath or take a shower.  Think about a  peaceful place. What are some ways I can prevent weight gain? Be aware that many people gain weight after they quit smoking. However, not everyone does. To keep from gaining weight, have a plan in place before you quit and stick to the plan after you quit. Your plan should include:  Having healthy snacks. When you have a craving, it may help to:  Eat plain popcorn, crunchy carrots, celery, or other  cut vegetables.  Chew sugar-free gum.  Changing how you eat:  Eat small portion sizes at meals.  Eat 4-6 small meals throughout the day instead of 1-2 large meals a day.  Be mindful when you eat. Do not watch television or do other things that might distract you as you eat.  Exercising regularly:  Make time to exercise each day. If you do not have time for a long workout, do short bouts of exercise for 5-10 minutes several times a day.  Do some form of strengthening exercise, like weight lifting, and some form of aerobic exercise, like running or swimming.  Drinking plenty of water or other low-calorie or no-calorie drinks. Drink 6-8 glasses of water daily, or as much as instructed by your health care provider. Summary  Quitting smoking is a physical and mental challenge. You will face cravings, withdrawal symptoms, and temptation to smoke again. Preparation can help you as you go through these challenges.  You can cope with cravings by keeping your mouth busy (such as by chewing gum), keeping your body and hands busy, and making calls to family, friends, or a helpline for people who want to quit smoking.  You can cope with withdrawal symptoms by avoiding places where people smoke, avoiding drinks with caffeine, and getting plenty of rest.  Ask your health care provider about the different ways to prevent weight gain, avoid stress, and handle social situations. This information is not intended to replace advice given to you by your health care provider. Make sure you discuss any questions you have with your health care provider. Document Released: 10/08/2016 Document Revised: 10/08/2016 Document Reviewed: 10/08/2016 Elsevier Interactive Patient Education  2017 [employer]  Diabetic Nephropathy Diabetic nephropathy is kidney disease that is caused by diabetes (diabetes mellitus). Kidneys are organs that filter and clean blood and get rid of body waste products and extra fluid.  Diabetes can cause gradual kidney damage over many years. Diabetic nephropathy that continues to get worse (progress) can lead to kidney failure. What are the causes? This condition is caused by kidney damage from diabetes that is not well controlled with treatment. Having high blood sugar (glucose) for a long time can damage blood vessels in the kidneys and cause them to thicken and scar. This prevents the kidneys from functioning normally. What increases the risk? This condition is more likely to develop in people with diabetes who:  Have had diabetes for many years.  Have high blood pressure.  Have high blood glucose levels over a long period of time.  Have a family history of kidney disease.  Have a history of tobacco use.  Have certain genes that are passed from parent to child (inherited).  Are of African-American, Hispanic, Native American, Asian, or [place] descent. What are the signs or symptoms? This condition may not cause symptoms at first. If you do have symptoms, they may include:  Swelling of your hands, feet, or ankles.  Weakness.  Poor appetite.  Nausea.  Confusion.  Fatigue.  Trouble sleeping.  Dry, itchy skin. If nephropathy leads  to kidney failure, symptoms may include:  Vomiting.  Shortness of breath.  Jerky movements you cannot control (seizure).  Coma. How is this diagnosed? It is important to diagnose this condition before symptoms develop. You may be screened for diabetic nephropathy at a routine health care visit. Screening tests may include:  Yearly (annual) urine tests.  Urine collection over a 24-hour period to measure kidney function.  Blood tests that measure blood glucose levels and kidney function. If your health care provider suspects diabetic nephropathy, he or she may:  Review your medical history and symptoms.  Do a physical exam.  Do an ultrasound of your kidneys.  Perform a procedure to take a sample of  kidney tissue for testing (biopsy). How is this treated? The goal of treatment is to prevent or slow down damage to your kidneys by managing your diabetes. To do this, it is important to control:  Your blood pressure.  Generally, the goal is to keep your blood pressure below 130/80. Your target blood pressure depends on many factors.  To help control blood pressure, you may be prescribed medicines to lower blood pressure (ACE inhibitors) or to help your body get rid of excess fluid (diuretics).  Your A1c (hemoglobin A1c) level. Generally, the goal of treatment is to maintain an A1c level of less than 7%.  Your blood glucose level.  Your blood lipids. If you have high cholesterol, you may need to take lipid-lowering drugs, such as statins. Other treatments may include:  Medicines, including insulin injections.  Lifestyle changes, such as:  Losing weight.  Quitting smoking (smoking cessation).  Changes to your diet, which may include:  Limiting your salt (sodium), protein, and fluid intake.  Taking vitamin D supplements. If your disease progresses to end-stage kidney failure, treatment may include:  Dialysis. This is a procedure to filter your blood with a machine.  Kidney transplant. Follow these instructions at home: Lifestyle   Maintain a healthy weight. Work with your health care provider to lose weight, if needed.  Do not use any products that contain nicotine or tobacco, such as cigarettes and e-cigarettes. If you need help quitting, ask your health care provider.  Be physically active every day. Ask your health care provider what type of exercise is best for you.  Eat healthy foods, and eat healthy snacks between meals. Follow instructions from your health care provider about eating and drinking restrictions.  Limit your sodium, protein, or fluid intake as directed.  Work with your health care provider to manage your blood pressure. General instructions    Follow your diabetes management plan as directed.  Check your blood glucose levels as directed by your health care provider.  Keep your blood glucose in your target range as directed by your health care provider.  Have your A1c level checked at least two times a year, or as often as told by your health care provider.  Take over-the-counter and prescription medicines only as told by your health care provider. This includes insulin and supplements.  Keep all follow-up visits and routine visits as told by your health care provider. This is important. Make sure to get screening tests as directed. Contact a health care provider if:  You have trouble keeping your blood glucose in your goal range.  Your blood glucose level is higher than 240 mg/dL (13.3 mmol/L) for 2 days in a row.  You have swelling in your hands, ankles, or feet.  You feel weak, tired, or dizzy.  You have involuntary  muscle tightening (spasms).  You have nausea or vomiting.  You feel tired all the time. Get help right away if:  You are very sleepy.  You have:  A seizure.  Severe, painful muscle spasms.  Shortness of breath.  Chest pain.  You faint. Summary  Keep your blood glucose in your target range as directed by your health care provider.  Work with your health care provider to manage your blood pressure.  Keep all follow-up visits and routine visits as told by your health care provider. This is important. Make sure to get screening tests as directed. This information is not intended to replace advice given to you by your health care provider. Make sure you discuss any questions you have with your health care provider. Document Released: 10/31/2007 Document Revised: 09/08/2016 Document Reviewed: 09/08/2016 Elsevier Interactive Patient Education  2017 [employer]  Hypertension Hypertension is another name for high blood pressure. High blood pressure forces your heart to work harder to pump  blood. This can cause problems over time. There are two numbers in a blood pressure reading. There is a top number (systolic) over a bottom number (diastolic). It is best to have a blood pressure below 120/80. Healthy choices can help lower your blood pressure. You may need medicine to help lower your blood pressure if:  Your blood pressure cannot be lowered with healthy choices.  Your blood pressure is higher than 130/80. Follow these instructions at home: Eating and drinking   If directed, follow the DASH eating plan. This diet includes:  Filling half of your plate at each meal with fruits and vegetables.  Filling one quarter of your plate at each meal with whole grains. Whole grains include whole wheat pasta, brown rice, and whole grain bread.  Eating or drinking low-fat dairy products, such as skim milk or low-fat yogurt.  Filling one quarter of your plate at each meal with low-fat (lean) proteins. Low-fat proteins include fish, skinless chicken, eggs, beans, and tofu.  Avoiding fatty meat, cured and processed meat, or chicken with skin.  Avoiding premade or processed food.  Eat less than 1,500 mg of salt (sodium) a day.  Limit alcohol use to no more than 1 drink a day for nonpregnant women and 2 drinks a day for men. One drink equals 12 oz of beer, 5 oz of wine, or 1 oz of hard liquor. Lifestyle   Work with your doctor to stay at a healthy weight or to lose weight. Ask your doctor what the best weight is for you.  Get at least 30 minutes of exercise that causes your heart to beat faster (aerobic exercise) most days of the week. This may include walking, swimming, or biking.  Get at least 30 minutes of exercise that strengthens your muscles (resistance exercise) at least 3 days a week. This may include lifting weights or pilates.  Do not use any products that contain nicotine or tobacco. This includes cigarettes and e-cigarettes. If you need help quitting, ask your  doctor.  Check your blood pressure at home as told by your doctor.  Keep all follow-up visits as told by your doctor. This is important. Medicines   Take over-the-counter and prescription medicines only as told by your doctor. Follow directions carefully.  Do not skip doses of blood pressure medicine. The medicine does not work as well if you skip doses. Skipping doses also puts you at risk for problems.  Ask your doctor about side effects or reactions to medicines that you  should watch for. Contact a doctor if:  You think you are having a reaction to the medicine you are taking.  You have headaches that keep coming back (recurring).  You feel dizzy.  You have swelling in your ankles.  You have trouble with your vision. Get help right away if:  You get a very bad headache.  You start to feel confused.  You feel weak or numb.  You feel faint.  You get very bad pain in your:  Chest.  Belly (abdomen).  You throw up (vomit) more than once.  You have trouble breathing. Summary  Hypertension is another name for high blood pressure.  Making healthy choices can help lower blood pressure. If your blood pressure cannot be controlled with healthy choices, you may need to take medicine. This information is not intended to replace advice given to you by your health care provider. Make sure you discuss any questions you have with your health care provider. Document Released: 03/29/2008 Document Revised: 09/08/2016 Document Reviewed: 09/08/2016 Elsevier Interactive Patient Education  2017 [employer].
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
## 2017-03-12 LAB — THYROID PANEL WITH TSH
FREE THYROXINE INDEX: 1.8 (ref 1.4–3.8)
T3 Uptake: 26 % (ref 22–35)
T4, Total: 6.8 ug/dL (ref 4.5–12.0)
TSH: 1.28 m[IU]/L

## 2017-03-12 LAB — MICROALBUMIN, URINE: Microalb, Ur: 397.9 mg/dL

## 2017-03-14 ENCOUNTER — Other Ambulatory Visit: Payer: Self-pay | Admitting: Family Medicine

## 2017-03-14 ENCOUNTER — Telehealth: Payer: Self-pay | Admitting: Family Medicine

## 2017-03-14 LAB — POCT URINALYSIS DIP (DEVICE)
Bilirubin Urine: NEGATIVE
Glucose, UA: 100 mg/dL — AB
KETONES UR: NEGATIVE mg/dL
LEUKOCYTES UA: NEGATIVE
Nitrite: NEGATIVE
Protein, ur: >=300 mg/dL — AB
SPECIFIC GRAVITY, URINE: 1.025 (ref 1.005–1.030)
UROBILINOGEN UA: 0.2 mg/dL (ref 0.0–1.0)
pH: 5.5 (ref 5.0–8.0)

## 2017-03-14 MED ORDER — HYDRALAZINE HCL 100 MG PO TABS: 100.0000 mg | ORAL_TABLET | Freq: Two times a day (BID) | ORAL | 2 refills | Status: DC

## 2017-03-14 NOTE — Telephone Encounter (Signed)
Patient made aware of lab results and urgent referral to nephrology. To prevent worsening of kidney's discontinuing lisinopril. Staring on hydralazine 100 mg twice daily .  Tammy Moses, I have placed several referrals for patient, but nephrology is priority. Could we try and get an appointment for patient within the next 7-10 days. She is likely transition to the point of possible requiring dialysis in the near future.

## 2017-03-14 NOTE — Addendum Note (Signed)
Addended by: Scot Jun on: 03/14/2017 06:13 PM   Modules accepted: Orders

## 2017-03-15 NOTE — Telephone Encounter (Signed)
Referral sent to [facility] as a urgent referral.

## 2017-03-16 ENCOUNTER — Telehealth: Payer: Self-pay

## 2017-03-16 NOTE — Telephone Encounter (Signed)
Spoke with [facility] and they will be reaching out to patient this afternoon. They were concern with patient already being established with [facility]. They will discuss with patient to make sure she is ok with seeing them.

## 2017-03-16 NOTE — Telephone Encounter (Signed)
-----   Message from Scot Jun, [place] sent at 03/15/2017 10:14 PM EDT ----- Please contact [facility] tomorrow to follow-up on referral. It is imperative that we get this patient in with nephrology  If [facility] provided you with no response, Attempt a referral to  [facility] [address], [place], [place] [address] Phone: [phone]

## 2017-03-16 NOTE — Telephone Encounter (Signed)
Spoke with [employer] from [employer] [employer] and they will call ans schedule patient today.

## 2017-03-17 NOTE — Telephone Encounter (Signed)
Patient has a appointment with [facility] on 03/18/2017 at 2pm.

## 2017-03-18 ENCOUNTER — Ambulatory Visit (INDEPENDENT_AMBULATORY_CARE_PROVIDER_SITE_OTHER): Payer: Medicare Other | Admitting: Family Medicine

## 2017-03-18 VITALS — BP 140/80

## 2017-03-18 DIAGNOSIS — Z719 Counseling, unspecified: Secondary | ICD-10-CM

## 2017-03-18 NOTE — Progress Notes (Signed)
Completed scat form and was given to patient in office.

## 2017-03-29 ENCOUNTER — Other Ambulatory Visit: Payer: Self-pay | Admitting: Family Medicine

## 2017-03-29 DIAGNOSIS — Z1239 Encounter for other screening for malignant neoplasm of breast: Secondary | ICD-10-CM

## 2017-04-07 DIAGNOSIS — R809 Proteinuria, unspecified: Secondary | ICD-10-CM | POA: Insufficient documentation

## 2017-04-07 DIAGNOSIS — E559 Vitamin D deficiency, unspecified: Secondary | ICD-10-CM | POA: Insufficient documentation

## 2017-04-14 ENCOUNTER — Ambulatory Visit: Payer: Medicare Other

## 2017-04-14 ENCOUNTER — Encounter: Payer: Self-pay | Admitting: Internal Medicine

## 2017-04-18 ENCOUNTER — Ambulatory Visit: Payer: Medicare Other | Admitting: Family Medicine

## 2017-04-25 ENCOUNTER — Ambulatory Visit: Payer: Medicare Other | Admitting: Family Medicine

## 2017-04-30 NOTE — Progress Notes (Deleted)
   Cardiology Office Note   Date:  04/30/2017   ID:  Tammy Moses, DOB 21-Jan-1966, MRN [id number]  PCP:  Scot Jun, FNP  Cardiologist:   Dorris Carnes, MD       History of Present Illness: Tammy Moses is a 51 y.o. female with a history of       No outpatient prescriptions have been marked as taking for the 05/02/17 encounter (Appointment) with Fay Records, MD.     Allergies:   Patient has no known allergies.   Past Medical History:  Diagnosis Date  . Anemia   . Anxiety   . Blind right eye   . Constipation, chronic   . Dental caries   . Diabetes mellitus   . Diabetic neuropathy ([place])   . Diabetic retinopathy   . GERD (gastroesophageal reflux disease)   . Glaucoma   . H. pylori infection   . Hepatitis C carrier ([place])   . High risk sexual behavior   . Hyperlipidemia   . Hypertension   . Insomnia   . Microalbuminuria   . Nonspecific elevation of levels of transaminase or lactic acid dehydrogenase (LDH)   . Tobacco dependence   . Vitamin D deficiency     Past Surgical History:  Procedure Laterality Date  . ABDOMINAL HYSTERECTOMY  04/30/2009   PARTIAL  . COLONOSCOPY    . ESOPHAGOGASTRODUODENOSCOPY       Social History:  The patient  reports that she has been smoking Cigarettes.  She has been smoking about 0.25 packs per day. She has never used smokeless tobacco. She reports that she drinks alcohol. She reports that she uses drugs, including Marijuana and Cocaine.   Family History:  The patient's family history includes Diabetes in her father and mother; High blood pressure in her father and mother; Thyroid disease in her mother.    ROS:  Please see the history of present illness. All other systems are reviewed and  Negative to the above problem except as noted.    PHYSICAL EXAM: VS:  There were no vitals taken for this visit.  GEN: Well nourished, well developed, in no acute distress  HEENT: normal  Neck: no JVD, carotid bruits, or  masses Cardiac: RRR; no murmurs, rubs, or gallops,no edema  Respiratory:  clear to auscultation bilaterally, normal work of breathing GI: soft, nontender, nondistended, + BS  No hepatomegaly  MS: no deformity Moving all extremities   Skin: warm and dry, no rash Neuro:  Strength and sensation are intact Psych: euthymic mood, full affect   EKG:  EKG is ordered today.   Lipid Panel    Component Value Date/Time   CHOL 339 (H) 09/07/2016 1226   TRIG 193 (H) 09/07/2016 1226   HDL 87 09/07/2016 1226   CHOLHDL 3.9 09/07/2016 1226   VLDL 39 (H) 09/07/2016 1226   LDLCALC 213 (H) 09/07/2016 1226      Wt Readings from Last 3 Encounters:  03/11/17 64 kg (141 lb)  09/07/16 59.9 kg (132 lb)  04/30/16 51.7 kg (114 lb)      ASSESSMENT AND PLAN:     Current medicines are reviewed at length with the patient today.  The patient does not have concerns regarding medicines.  Signed, Dorris Carnes, MD  04/30/2017 11:32 AM    [facility] [place], [place], [place]  [address] Phone: [phone]; Fax: [phone]

## 2017-05-02 ENCOUNTER — Ambulatory Visit: Payer: Medicare Other | Admitting: Internal Medicine

## 2017-05-03 ENCOUNTER — Telehealth: Payer: Self-pay | Admitting: Family Medicine

## 2017-05-03 ENCOUNTER — Other Ambulatory Visit: Payer: Self-pay

## 2017-05-03 NOTE — Telephone Encounter (Signed)
Pt requests refill of glucose test strips; states that she will be out by Thursday; states uses [facility]

## 2017-05-03 NOTE — Telephone Encounter (Signed)
Pt requests med refill of glucose test strips be sent to pharmacy; pharmacy verified as [facility]

## 2017-05-04 NOTE — Telephone Encounter (Signed)
Test strips were called into [facility] with 12 refills

## 2017-05-04 NOTE — Telephone Encounter (Signed)
Waiting for patient to callback to let me know which pharmacy she needs this sent to. Got a request from [facility] and [facility].

## 2017-05-10 ENCOUNTER — Encounter: Payer: Self-pay | Admitting: Neurology

## 2017-05-10 ENCOUNTER — Ambulatory Visit (INDEPENDENT_AMBULATORY_CARE_PROVIDER_SITE_OTHER): Payer: Medicare Other | Admitting: Neurology

## 2017-05-10 VITALS — BP 172/92 | HR 86 | Ht 62.0 in | Wt 141.0 lb

## 2017-05-10 DIAGNOSIS — E1122 Type 2 diabetes mellitus with diabetic chronic kidney disease: Secondary | ICD-10-CM | POA: Insufficient documentation

## 2017-05-10 DIAGNOSIS — M545 Low back pain: Secondary | ICD-10-CM | POA: Diagnosis not present

## 2017-05-10 DIAGNOSIS — N186 End stage renal disease: Secondary | ICD-10-CM | POA: Insufficient documentation

## 2017-05-10 DIAGNOSIS — G6289 Other specified polyneuropathies: Secondary | ICD-10-CM

## 2017-05-10 DIAGNOSIS — H5440 Blindness, one eye, unspecified eye: Secondary | ICD-10-CM | POA: Diagnosis not present

## 2017-05-10 DIAGNOSIS — E119 Type 2 diabetes mellitus without complications: Secondary | ICD-10-CM | POA: Insufficient documentation

## 2017-05-10 DIAGNOSIS — R269 Unspecified abnormalities of gait and mobility: Secondary | ICD-10-CM

## 2017-05-10 DIAGNOSIS — G8929 Other chronic pain: Secondary | ICD-10-CM | POA: Diagnosis not present

## 2017-05-10 DIAGNOSIS — G629 Polyneuropathy, unspecified: Secondary | ICD-10-CM | POA: Insufficient documentation

## 2017-05-10 DIAGNOSIS — Z794 Long term (current) use of insulin: Secondary | ICD-10-CM

## 2017-05-10 DIAGNOSIS — E1339 Other specified diabetes mellitus with other diabetic ophthalmic complication: Secondary | ICD-10-CM

## 2017-05-10 NOTE — Progress Notes (Signed)
PATIENT: Tammy Moses DOB: 1966/09/01  Chief Complaint  Patient presents with  . headaches    Referred and PCP: Molli Barrows, FNP at [facility], pt is having daily headaches, is asking for an suboxone RX which seemed to help her pain, pt say that she is a 7/10 on pain scale today for headaches and has pain all over her body.  . Loss of Vision    pt reports that she is legally blind, unable to do a vision screen today     HISTORICAL  Tammy Moses is a 51 years old right-handed female was brought in by her family, but alone at today's clinical visit, seen in refer by  her primary care nurse practitioner Molli Barrows for evaluation of headache, diffuse body achy pain, initial evaluation was on May 10 2017.  She had a history of diabetes, insulin-dependent, hyperlipidemia, sickle cell disease, right eye blind, poor vision on the left eye, can only read large print, glaucoma, diabetic retinopathy, history of hepatitis C, post treatment, hypertension, smoked daily, she lives at home with her husband. She reported a history of marijuana and cocaine use, last use was 4 days ago.  She complains gradual onset bilateral feet paresthesia, getting worse since 2016, right foot drop, gait abnormality, chronic low back pain since she fell off the truck in 2010, since 2016 also noticed bilateral fingertips paresthesia, low extremity discomfort, especially at right foot, constant cold sensation.  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  CT head without contrast in August 2017, small vessel disease, no acute abnormality, CT  cervical spine: Straightening of cervical lordosis, no acute abnormality, moderate multilevel cervical spondylosis, with endplate degenerative changes, disc space narrowing, marginal osteophyte at C6-7,  Laboratory evaluation in May 2018: CMP, creatinine was 3.4, glucose 138, estimated GFR 17, Normal thyroid functional test, CBC, elevated total cholesterol 339, triglyceride 193, LDL 213, A1c 6.7  REVIEW OF SYSTEMS: Full 14 system review of systems performed and notable only for as above  ALLERGIES: No Known Allergies  HOME MEDICATIONS: Current Outpatient Prescriptions  Medication Sig Dispense Refill  . albuterol (PROVENTIL HFA;VENTOLIN HFA) 108 (90 BASE) MCG/ACT inhaler Inhale 2 puffs into the lungs every 4 (four) hours as needed for shortness of breath. For shortness of breath    . atenolol (TENORMIN) 25 MG tablet Take 1 tablet (25 mg total) by mouth daily. 90 tablet 1  . atropine 1 % ophthalmic solution Place 1 drop into both eyes 2 (two) times daily.    Marland Kitchen buPROPion (WELLBUTRIN SR) 150 MG 12 hr tablet Take 1 tablet (150 mg total) by mouth 2 (two) times daily. 60 tablet 2  . Cholecalciferol (VITAMIN D3) 1200 UNIT/15ML LIQD Take 15 mLs by mouth daily.    . cloNIDine (CATAPRES - DOSED IN MG/24 HR) 0.2 mg/24hr patch Place 1 patch (0.2 mg total) onto the skin once a week. 4 patch 12  . furosemide (LASIX) 20 MG tablet Take 20 mg by mouth daily.    Marland Kitchen gabapentin (NEURONTIN) 300 MG capsule Take 2 capsules (600 mg total) by mouth 3 (three) times daily. 180 capsule 3  .  Gabapentin, Once-Daily, 600 MG TABS Take 600 mg by mouth 3 (three) times daily. 90 tablet 12  . glimepiride (AMARYL) 4 MG tablet Take 4 mg by mouth 2 (two) times daily.    Marland Kitchen glucose blood (ONE TOUCH ULTRA TEST) test strip Use as instructed 100 each 12  . hydrALAZINE (APRESOLINE) 100 MG tablet Take 1 tablet (100 mg total) by mouth 2 (two) times daily. 60 tablet 2  . Insulin Glargine (LANTUS SOLOSTAR) 100 UNIT/ML Solostar Pen Inject 50  Units into the skin daily at 10 pm. 5 pen PRN  . Insulin Pen Needle 29G X 12MM MISC E11.9 Dx Code Use once at bedtime with each nightly insulin 100 each 3  . ketotifen (ZADITOR) 0.025 % ophthalmic solution Place 1 drop into both eyes 2 (two) times daily. 5 mL 0  . Lancets (ONETOUCH ULTRASOFT) lancets Use as instructed 100 each 12  . omeprazole (PRILOSEC) 20 MG capsule Take 1 capsule (20 mg total) by mouth daily. Has to be taken at the same time with Harvoni FASTED 30 capsule 11  . pravastatin (PRAVACHOL) 80 MG tablet Take 1 tablet (80 mg total) by mouth daily. 90 tablet 1  . saxagliptin HCl (ONGLYZA) 2.5 MG TABS tablet Take 1 tablet (2.5 mg total) by mouth daily. 90 tablet 0  . Travoprost, BAK Free, (TRAVATAN) 0.004 % SOLN ophthalmic solution Place 1 drop into both eyes at bedtime.    Marland Kitchen trimethoprim-polymyxin b (POLYTRIM) ophthalmic solution Place 1 drop into both eyes every 4 (four) hours. 10 mL 0   No current facility-administered medications for this visit.     PAST MEDICAL HISTORY: Past Medical History:  Diagnosis Date  . Anemia   . Anxiety   . Blind right eye   . Constipation, chronic   . Dental caries   . Diabetes mellitus   . Diabetic neuropathy ([place])   . Diabetic retinopathy   . GERD (gastroesophageal reflux disease)   . Glaucoma   . H. pylori infection   . Hepatitis C carrier ([place])   . High risk sexual behavior   . Hyperlipidemia   . Hypertension   . Insomnia   . Microalbuminuria   . Nonspecific elevation of levels of transaminase or lactic acid dehydrogenase (LDH)   . Tobacco dependence   . Vitamin D deficiency     PAST SURGICAL HISTORY: Past Surgical History:  Procedure Laterality Date  . ABDOMINAL HYSTERECTOMY  04/30/2009   PARTIAL  . COLONOSCOPY    . ESOPHAGOGASTRODUODENOSCOPY      FAMILY HISTORY: Family History  Problem Relation Age of Onset  . High blood pressure Mother   . Diabetes Mother   . Thyroid disease Mother   . Diabetes Father   . High  blood pressure Father     SOCIAL HISTORY:  Social History   Social History  . Marital status: Married    Spouse name: N/A  . Number of children: 2  . Years of education: 11   Occupational History  .  Disabled    Disabled   Social History Main Topics  . Smoking status: Current Every Day Smoker    Packs/day: 0.25    Types: Cigarettes  . Smokeless tobacco: Never Used     Comment: trying to quit  . Alcohol use Yes     Comment: wine  . Drug use: Yes    Types: Marijuana, Cocaine     Comment: quit 10/25/13  . Sexual activity: Not on file   Other Topics Concern  .  Not on file   Social History Narrative   Patient lives at home with he boyfriend.    Disabled.   Right handed.   Education 11 th grade.   Caffeine three cups of coffee daily.     PHYSICAL EXAM   Vitals:   05/10/17 0912  BP: (!) 172/92  Pulse: 86  Weight: 141 lb (64 kg)  Height: 5\' 2"  (1.575 m)    Not recorded      Body mass index is 25.79 kg/m.  PHYSICAL EXAMNIATION:  Gen: NAD, conversant, well nourised, obese, well groomed                     Cardiovascular: Regular rate rhythm, no peripheral edema, warm, nontender. Eyes: Conjunctivae clear without exudates or hemorrhage Neck: Supple, no carotid bruits. Pulmonary: Clear to auscultation bilaterally   NEUROLOGICAL EXAM:  MENTAL STATUS: Speech:    Speech is normal; fluent and spontaneous with normal comprehension.  Cognition:     Orientation to time, place and person     Normal recent and remote memory     Normal Attention span and concentration     Normal Language, naming, repeating,spontaneous speech     Fund of knowledge   CRANIAL NERVES: CN II: Right cornea opaque, no light sensitivity, left pupil was irregular, able to count fingers CN III, IV, VI: extraocular movement are normal. No ptosis. CN V: Facial sensation is intact to pinprick in all 3 divisions bilaterally. Corneal responses are intact.  CN VII: Face is symmetric with  normal eye closure and smile. CN VIII: Hearing is normal to rubbing fingers CN IX, X: Palate elevates symmetrically. Phonation is normal. CN XI: Head turning and shoulder shrug are intact CN XII: Tongue is midline with normal movements and no atrophy.  MOTOR: Mild bilateral hand intrinsic muscle atrophy, greater and finger abduction weakness 4/5, no significant bilateral lower extremity proximal muscle weakness, bilateral distal leg weakness, ankle dorsi flexion (R/L), 1/4, ankle plantar flexion 3/4,  REFLEXES: Reflexes are absent at bilateral lower extremity, plantar responses were flexor  SENSORY: Length dependent decreased to light touch, pinprick, positional sensation and vibratory sensation at bilateral lower extremities, worsening on the right side to knee level, Left side was to distal shin  COORDINATION: Rapid alternating movements and fine finger movements are intact. There is no dysmetria on finger-to-nose and heel-knee-shin.    GAIT/STANCE: She needs to push up to get up from seated position, bilateral foot drop, worsening on the right side  DIAGNOSTIC DATA (LABS, IMAGING, TESTING) - I reviewed patient records, labs, notes, testing and imaging myself where available.   ASSESSMENT AND PLAN  SHARRIE SELF is a 51 y.o. female   Gait abnormality  Significant distal leg weakness, likely due to diabetic peripheral neuropathy  Laboratory evaluation to rule out other treatable etiology Chronic low back pain  Right leg distal weakness and sensory loss more than the left side, MRI of lumbar to rule out lumbar radiculopathy  EMG nerve conduction study   Marcial Pacas, M.D. Ph.D.  [facility] [address], [place], [place] [address] Ph: [phone] Fax: [phone]  CC: Scot Jun, FNP

## 2017-05-11 ENCOUNTER — Ambulatory Visit: Payer: Medicare Other | Admitting: Physician Assistant

## 2017-05-13 ENCOUNTER — Telehealth: Payer: Self-pay | Admitting: Neurology

## 2017-05-13 LAB — COMPREHENSIVE METABOLIC PANEL
ALBUMIN: 3.9 g/dL (ref 3.5–5.5)
ALK PHOS: 76 IU/L (ref 39–117)
ALT: 21 IU/L (ref 0–32)
AST: 23 IU/L (ref 0–40)
Albumin/Globulin Ratio: 1.3 (ref 1.2–2.2)
BUN / CREAT RATIO: 9 (ref 9–23)
BUN: 38 mg/dL — AB (ref 6–24)
CHLORIDE: 109 mmol/L — AB (ref 96–106)
CO2: 15 mmol/L — AB (ref 20–29)
Calcium: 9 mg/dL (ref 8.7–10.2)
Creatinine, Ser: 4.18 mg/dL — ABNORMAL HIGH (ref 0.57–1.00)
GFR calc Af Amer: 13 mL/min/{1.73_m2} — ABNORMAL LOW (ref >59)
GFR calc non Af Amer: 12 mL/min/{1.73_m2} — ABNORMAL LOW (ref >59)
Globulin, Total: 3 g/dL (ref 1.5–4.5)
Glucose: 180 mg/dL — ABNORMAL HIGH (ref 65–99)
Potassium: 4.9 mmol/L (ref 3.5–5.2)
Sodium: 146 mmol/L — ABNORMAL HIGH (ref 134–144)
Total Protein: 6.9 g/dL (ref 6.0–8.5)

## 2017-05-13 LAB — HEPATITIS PANEL, ACUTE
HEP B C IGM: NEGATIVE
HEP B S AG: NEGATIVE
Hep A IgM: NEGATIVE
Hep C Virus Ab: >11 s/co ratio — ABNORMAL HIGH (ref 0.0–0.9)

## 2017-05-13 LAB — CBC WITH DIFFERENTIAL/PLATELET
BASOS ABS: 0.1 10*3/uL (ref 0.0–0.2)
Basos: 1 %
EOS (ABSOLUTE): 0.3 10*3/uL (ref 0.0–0.4)
Eos: 4 %
Hematocrit: 38.6 % (ref 34.0–46.6)
Hemoglobin: 11.7 g/dL (ref 11.1–15.9)
IMMATURE GRANULOCYTES: 0 %
Immature Grans (Abs): 0 10*3/uL (ref 0.0–0.1)
LYMPHS: 43 %
Lymphocytes Absolute: 4 10*3/uL — ABNORMAL HIGH (ref 0.7–3.1)
MCH: 28.3 pg (ref 26.6–33.0)
MCHC: 30.3 g/dL — ABNORMAL LOW (ref 31.5–35.7)
MCV: 93 fL (ref 79–97)
MONOS ABS: 0.6 10*3/uL (ref 0.1–0.9)
Monocytes: 6 %
NEUTROS PCT: 46 %
Neutrophils Absolute: 4.3 10*3/uL (ref 1.4–7.0)
PLATELETS: 242 10*3/uL (ref 150–379)
RBC: 4.14 x10E6/uL (ref 3.77–5.28)
RDW: 14.7 % (ref 12.3–15.4)
WBC: 9.3 10*3/uL (ref 3.4–10.8)

## 2017-05-13 LAB — COPPER, SERUM: Copper: 70 ug/dL — ABNORMAL LOW (ref 72–166)

## 2017-05-13 LAB — VITAMIN B12: Vitamin B-12: 623 pg/mL (ref 232–1245)

## 2017-05-13 LAB — CK: Total CK: 598 U/L (ref 24–173)

## 2017-05-13 LAB — FOLATE: FOLATE: 6.8 ng/mL (ref >3.0)

## 2017-05-13 LAB — C-REACTIVE PROTEIN: CRP: 0.4 mg/L (ref 0.0–4.9)

## 2017-05-13 LAB — SEDIMENTATION RATE: SED RATE: 23 mm/h (ref 0–40)

## 2017-05-13 LAB — HGB A1C W/O EAG: Hgb A1c MFr Bld: 6.6 % — ABNORMAL HIGH (ref 4.8–5.6)

## 2017-05-13 LAB — RPR: RPR Ser Ql: NONREACTIVE

## 2017-05-13 LAB — TSH: TSH: 3 u[IU]/mL (ref 0.450–4.500)

## 2017-05-13 LAB — HIV ANTIBODY (ROUTINE TESTING W REFLEX): HIV SCREEN 4TH GENERATION: NONREACTIVE

## 2017-05-13 LAB — VITAMIN D 25 HYDROXY (VIT D DEFICIENCY, FRACTURES): VIT D 25 HYDROXY: 9 ng/mL — AB (ref 30.0–100.0)

## 2017-05-13 NOTE — Telephone Encounter (Signed)
Please call patient, laboratory evaluation showed significant decrease vitamin D, she should take vitamin D 3 1000 units, 2 tablets daily  Elevated A1c 6.6,  Significant decreased kidney function, continued decline in GFR only 12, in comparison to 15 in May 2018.

## 2017-05-16 ENCOUNTER — Encounter: Payer: Self-pay | Admitting: *Deleted

## 2017-05-16 NOTE — Telephone Encounter (Signed)
Left message requesting a return call.

## 2017-05-16 NOTE — Telephone Encounter (Addendum)
Spoke to patient - she is aware of results.  She has a pending appt w/ her nephrologist, Dr. Biagio Quint on 05/17/17.  Her lab results have been faxed over for her appt.  She will also start the recommended vitamin D3 supplement.

## 2017-05-17 DIAGNOSIS — Z9114 Patient's other noncompliance with medication regimen: Secondary | ICD-10-CM | POA: Insufficient documentation

## 2017-05-20 ENCOUNTER — Ambulatory Visit (INDEPENDENT_AMBULATORY_CARE_PROVIDER_SITE_OTHER): Payer: Self-pay | Admitting: Neurology

## 2017-05-20 ENCOUNTER — Ambulatory Visit (INDEPENDENT_AMBULATORY_CARE_PROVIDER_SITE_OTHER): Payer: Medicare Other | Admitting: Neurology

## 2017-05-20 DIAGNOSIS — Z794 Long term (current) use of insulin: Secondary | ICD-10-CM

## 2017-05-20 DIAGNOSIS — M545 Low back pain: Secondary | ICD-10-CM | POA: Diagnosis not present

## 2017-05-20 DIAGNOSIS — Z0289 Encounter for other administrative examinations: Secondary | ICD-10-CM

## 2017-05-20 DIAGNOSIS — G6289 Other specified polyneuropathies: Secondary | ICD-10-CM

## 2017-05-20 DIAGNOSIS — G5622 Lesion of ulnar nerve, left upper limb: Secondary | ICD-10-CM | POA: Insufficient documentation

## 2017-05-20 DIAGNOSIS — G8929 Other chronic pain: Secondary | ICD-10-CM

## 2017-05-20 DIAGNOSIS — G63 Polyneuropathy in diseases classified elsewhere: Secondary | ICD-10-CM

## 2017-05-20 DIAGNOSIS — G5602 Carpal tunnel syndrome, left upper limb: Secondary | ICD-10-CM | POA: Diagnosis not present

## 2017-05-20 DIAGNOSIS — H5440 Blindness, one eye, unspecified eye: Secondary | ICD-10-CM

## 2017-05-20 DIAGNOSIS — E1339 Other specified diabetes mellitus with other diabetic ophthalmic complication: Secondary | ICD-10-CM

## 2017-05-20 DIAGNOSIS — R269 Unspecified abnormalities of gait and mobility: Secondary | ICD-10-CM

## 2017-05-20 NOTE — Progress Notes (Signed)
EMG/NCS study report is under procedure

## 2017-05-20 NOTE — Procedures (Signed)
Full Name: Tammy Moses Gender: Female MRN #: [id number] Date of Birth: 2066/05/24    Visit Date: 05/20/17 11:31 Age: 51 Years 70 Months Old Examining Physician: Marcial Pacas, MD  Referring Physician: Krista Blue, MD History: 51 years old female, with complicated past medical history, sickle cell disease, polysubstance abuse, diabetes, end-stage renal disease, progressive low back pain, gait abnormality, leg weakness, right worse than left.  Summary of the test:  Nerve conduction study: Bilateral sural, superficial peroneal sensory responses were absent. Left median, ulnar sensory responses were absent. Left median motor responses showed severely prolonged distal latency, with severely decreased C map amplitude, mildly slow conduction velocity. Left ulnar motor responses showed mildly prolonged distal latency, severely decreased C map amplitude, with severely decreased conduction velocity.  Electromyography: Selected needle examination was performed at bilateral lower extremity muscles, bilateral lumbar sacral paraspinal muscles, left upper extremity muscles, left cervical paraspinal muscles.   There is evidence of active neuropathic changes involving bilateral distal leg muscles, right worse than left,  There is also evidence of mild chronic neuropathic changes involving distal left upper extremity muscles,  There is no evidence of spontaneous activity at bilateral lumbar sacral paraspinal muscles, and left cervical paraspinal muscles.  Conclusion: This is an abnormal study. There is electrodiagnostic evidence of severe axonal peripheral neuropathy, active neuropathic changes involving right L4-5 myotomes more than the right side, could not rule out possibility of superimposed right lumbar sacral radiculopathy. 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   ------------------------------- Marcial Pacas, M.D.  [facility] [address], [place] [address] Tel: [phone] Fax: [phone]        [facility]    Nerve / Sites Muscle Latency Ref. Amplitude Ref. Rel Amp Segments Distance Velocity Ref. Area    ms ms mV mV %  cm m/s m/s mVms  L Median - APB     Wrist APB 8.6 ?4.4 1.9 ?4.0 100 Wrist - APB 7   7.6     Upper arm APB 13.1  1.3  69.6 Upper arm - Wrist 18 40 ?49 5.3  L Ulnar - ADM     Wrist ADM 3.6 ?3.3 1.4 ?6.0 100 Wrist - ADM 7   4.8     B.Elbow ADM 12.4  0.6  45.4 B.Elbow - Wrist 27 30 ?49 1.7     A.Elbow ADM 15.7  0.5  86.3 A.Elbow - B.Elbow 10 30 ?49 1.7         A.Elbow - Wrist      L Peroneal - EDB     Ankle EDB NR ?6.5 NR ?2.0 NR Ankle - EDB 9   NR     Fib head EDB      Fib head - Ankle   ?44          Pop fossa - Ankle      R Peroneal - EDB     Ankle EDB NR ?6.5 NR ?2.0 NR Ankle - EDB 9   NR     Fib head EDB      Fib head - Ankle   ?44          Pop fossa - Ankle      L Tibial - AH     Ankle AH NR ?5.8 NR ?4.0 NR Ankle - AH 9   NR  R Tibial - AH     Ankle AH NR ?5.8 NR ?4.0 NR Ankle - AH 9   NR                 SNC    Nerve / Sites Rec. Site Peak Lat Ref.  Amp Ref. Segments Distance    ms ms V V  cm  L Radial - Anatomical snuff box (Forearm)     Forearm Wrist NR ?2.9 NR ?15 Forearm - Wrist 10  L Sural - Ankle (Calf)     Calf Ankle NR ?4.4 NR ?6 Calf - Ankle 14  R Sural - Ankle (Calf)     Calf Ankle NR ?4.4 NR ?6 Calf - Ankle 14  L Superficial peroneal - Ankle     Lat leg Ankle NR ?4.4 NR ?6 Lat leg - Ankle 14  R Superficial peroneal - Ankle     Lat leg Ankle NR ?4.4 NR ?6 Lat leg - Ankle 14  L Median - Orthodromic (Dig II, Mid palm)     Dig II Wrist NR ?3.4 NR ?10 Dig II - Wrist 13  L Ulnar - Orthodromic, (Dig V, Mid palm)     Dig V Wrist NR ?3.1 NR ?5 Dig V - Wrist 52                   F  Wave    Nerve F Lat Ref.   ms ms  L Ulnar - ADM 38.2 ?32.0       EMG full        EMG Summary Table    Spontaneous MUAP Recruitment  Muscle IA Fib PSW Fasc Other Amp Dur. Poly Pattern  R. Tibialis anterior Increased 3+ None None _______ Normal Normal Normal Discrete  R. Tibialis posterior Increased 2+ None None _______ Normal Normal Normal Discrete  R. Gastrocnemius (Medial head) Increased None None None _______ Decreased Normal Normal Discrete  L. Tibialis anterior Increased 3+ 3+ None _______ Increased Increased Normal Reduced  L. Tibialis posterior Increased 3+ 3+ None _______ Increased Increased 1+ Reduced  L. Gastrocnemius (Medial head) Increased 2+ 2+ None _______ Increased Increased 1+ Reduced  R. Vastus  lateralis Increased none None None _______ Increased Increased Normal Mildly reduced  L. Vastus lateralis Increased None None None _______ Increased Normal Normal Reduced  R. Lumbar paraspinals (mid) Normal None None None _______ Normal Normal Normal Normal  R. Lumbar paraspinals (low) Normal None None None _______ Normal Normal Normal Normal  L. Lumbar paraspinals (mid) Normal None None None _______ Normal Normal Normal Normal  L. Lumbar paraspinals (low) Normal None None None _______ Normal Normal Normal Normal  L. Cervical paraspinals Normal None None None _______ Normal Normal Normal Normal  R. Vastus lateralis Increased None None None _______ Increased Normal Normal Reduced  L. First dorsal interosseous Increased None None None _______ Normal Normal Normal Reduced  L. Pronator teres Normal None None None _______ Normal Normal Normal Normal  L. Biceps brachii Normal None None None _______ Normal Normal Normal Reduced  L. Deltoid Normal None None None _______ Normal Normal Normal Normal

## 2017-05-26 ENCOUNTER — Telehealth: Payer: Self-pay | Admitting: Neurology

## 2017-05-26 ENCOUNTER — Encounter: Payer: Self-pay | Admitting: *Deleted

## 2017-05-26 DIAGNOSIS — M21379 Foot drop, unspecified foot: Secondary | ICD-10-CM | POA: Insufficient documentation

## 2017-05-26 NOTE — Telephone Encounter (Signed)
[email or web address]  will be in the office until 4:00 today and will be back on Tues morning @8 :30

## 2017-05-26 NOTE — Telephone Encounter (Signed)
Returned call to Fritz Pickerel at [employer] - he is requesting an updated order for this patient for "eval and tx of bilateral foot drop and AFO brace".  Dr. Krista Blue agreeable to request.  Order signed, faxed and confirmed to [employer] at [phone].

## 2017-05-26 NOTE — Telephone Encounter (Signed)
Fritz Pickerel from [employer] has called re: the brace ordered for pt.  It will not help lift pt foot. He is asking for a cal at [phone]

## 2017-05-30 ENCOUNTER — Ambulatory Visit
Admission: RE | Admit: 2017-05-30 | Discharge: 2017-05-30 | Disposition: A | Discharge status: Home / Self Care | Payer: Medicare Other | Source: Ambulatory Visit | Attending: Neurology | Admitting: Neurology

## 2017-05-30 DIAGNOSIS — R269 Unspecified abnormalities of gait and mobility: Secondary | ICD-10-CM | POA: Diagnosis not present

## 2017-05-30 DIAGNOSIS — G8929 Other chronic pain: Secondary | ICD-10-CM

## 2017-05-30 DIAGNOSIS — Z794 Long term (current) use of insulin: Secondary | ICD-10-CM

## 2017-05-30 DIAGNOSIS — E1339 Other specified diabetes mellitus with other diabetic ophthalmic complication: Secondary | ICD-10-CM

## 2017-05-30 DIAGNOSIS — M545 Low back pain: Secondary | ICD-10-CM | POA: Diagnosis not present

## 2017-05-30 DIAGNOSIS — G6289 Other specified polyneuropathies: Secondary | ICD-10-CM

## 2017-05-30 DIAGNOSIS — H5440 Blindness, one eye, unspecified eye: Secondary | ICD-10-CM

## 2017-05-31 ENCOUNTER — Telehealth: Payer: Self-pay | Admitting: *Deleted

## 2017-05-31 NOTE — Telephone Encounter (Signed)
-----   Message from Marcial Pacas, MD sent at 05/30/2017  5:34 PM EDT ----- Please call pt: MRI of lumbar showed mild degenerative changes, but there was no significant foraminal or canal narrowing, no significant change compared to previous MRI in 2015

## 2017-05-31 NOTE — Telephone Encounter (Signed)
Spoke to pt and relayed that her MRI lumbar showed mild degenerative changes, no significant change compared to previous MRI 2015 ( no sigificant narrowing).  Pt verbalized understanding.

## 2017-06-07 ENCOUNTER — Telehealth: Payer: Self-pay | Admitting: Family Medicine

## 2017-06-07 ENCOUNTER — Ambulatory Visit (INDEPENDENT_AMBULATORY_CARE_PROVIDER_SITE_OTHER): Payer: Medicare Other | Admitting: Family Medicine

## 2017-06-07 ENCOUNTER — Encounter: Payer: Self-pay | Admitting: Family Medicine

## 2017-06-07 VITALS — BP 176/90 | HR 92 | Temp 97.8°F | Resp 16 | Ht 62.0 in | Wt 139.6 lb

## 2017-06-07 DIAGNOSIS — E1122 Type 2 diabetes mellitus with diabetic chronic kidney disease: Secondary | ICD-10-CM | POA: Diagnosis not present

## 2017-06-07 DIAGNOSIS — N185 Chronic kidney disease, stage 5: Secondary | ICD-10-CM

## 2017-06-07 DIAGNOSIS — N186 End stage renal disease: Secondary | ICD-10-CM | POA: Diagnosis not present

## 2017-06-07 DIAGNOSIS — Z794 Long term (current) use of insulin: Secondary | ICD-10-CM | POA: Diagnosis not present

## 2017-06-07 DIAGNOSIS — I10 Essential (primary) hypertension: Secondary | ICD-10-CM | POA: Diagnosis not present

## 2017-06-07 LAB — GLUCOSE, CAPILLARY: Glucose-Capillary: 113 mg/dL — ABNORMAL HIGH (ref 65–99)

## 2017-06-07 MED ORDER — CLONIDINE HCL 0.1 MG PO TABS
0.1000 mg | ORAL_TABLET | Freq: Once | ORAL | Status: AC
  Administered 2017-06-07: 0.1 mg via ORAL

## 2017-06-07 MED ORDER — METOPROLOL TARTRATE 25 MG PO TABS: 25.0000 mg | ORAL_TABLET | Freq: Two times a day (BID) | ORAL | 3 refills | Status: DC

## 2017-06-07 NOTE — Telephone Encounter (Signed)
Received a phone call from [facility] Neph[facility] Dr. Biagio Quint office to advise me that patient was recently prescribed Labetalol for blood pressure management and that I should discontinue metoprolol. Reviewed medication list with nurse and patient reported that she is still taking Lisinopril which was discontinued by me on 03/15/2017. Nurse from [facility] contacting patient to continue labetalol, discontinuing metoprolol, and patient should not be taking Lisinopril.

## 2017-06-07 NOTE — Progress Notes (Signed)
Patient ID: IDONA Moses, female    DOB: 01-25-1966, 51 y.o.   MRN: [id number]  PCP: Scot Jun, FNP  Chief Complaint  Patient presents with  . Hypertension    Subjective:  HPI  Tammy Moses is a 51 y.o. female presents for evaluation of hypertension. Medical problems include: Hypertension, Hepatitis C, Blind Right Eye (glaucoma), CKD, Type 2 Insulin dependent diabetes , gait abnormality requiring a mobility aid, hx of polysubstance abuse. Kierstynn reports elevated blood pressure for greater than 1 week. She was advised to follow-up today for blood pressure management as she is scheduled to have a access placed as she will be starting dialysis due to end-stage renal disease. Analysa reports that her blood pressure must be under 150/90 in order to prevent rescheduling of procedure.  Mckynlee reports compliance with current medication regimen.  Social History   Social History  . Marital status: Married    Spouse name: N/A  . Number of children: 2  . Years of education: 11   Occupational History  .  Disabled    Disabled   Social History Main Topics  . Smoking status: Current Every Day Smoker    Packs/day: 0.25    Types: Cigarettes  . Smokeless tobacco: Never Used     Comment: trying to quit  . Alcohol use Yes     Comment: wine  . Drug use: Yes    Types: Marijuana, Cocaine     Comment: quit 10/25/13  . Sexual activity: Not on file   Other Topics Concern  . Not on file   Social History Narrative   Patient lives at home with he boyfriend.    Disabled.   Right handed.   Education 11 th grade.   Caffeine three cups of coffee daily.    Family History  Problem Relation Age of Onset  . High blood pressure Mother   . Diabetes Mother   . Thyroid disease Mother   . Diabetes Father   . High blood pressure Father    Review of Systems See HPI  Patient Active Problem List   Diagnosis Date Noted  . Foot drop 05/26/2017  . Carpal tunnel syndrome of left wrist  05/20/2017  . 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 Peripheral neuropathy 05/10/2017  . Diabetes mellitus ([place]) 05/10/2017  . Gait abnormality 03/07/2014  . Abnormality of gait 03/07/2014  . Hepatitis C virus infection without hepatic coma 03/04/2014  . Erosive gastritis 02/14/2014  . BACK PAIN, LUMBAR 08/18/2010  . IDDM 08/11/2010  . HYPERCHOLESTEROLEMIA 08/11/2010  . HYPOKALEMIA 08/11/2010  . DEPRESSION 08/11/2010  . GLAUCOMA 08/11/2010  . BLINDNESS, RIGHT EYE 08/11/2010  . HYPERTENSION 08/11/2010  . GERD 08/11/2010  . CONSTIPATION 08/06/2009    No Known Allergies  Prior to Admission medications   Medication Sig Start Date End Date Taking? Authorizing Provider  albuterol (PROVENTIL HFA;VENTOLIN HFA) 108 (90 BASE) MCG/ACT inhaler Inhale 2 puffs into the lungs every 4 (four) hours as needed for shortness of breath. For shortness of breath   Yes [provider]  atropine 1 % ophthalmic solution Place 1 drop into both eyes 2 (two) times daily.   Yes [provider]  buPROPion (WELLBUTRIN SR) 150 MG 12 hr tablet Take 1 tablet (150 mg total) by mouth 2 (two) times daily. 03/11/17  Yes Scot Jun, FNP  Cholecalciferol (VITAMIN D-3) 1000 units CAPS Take 2,000 Units by mouth daily.   Yes [provider]  cloNIDine (  CATAPRES - DOSED IN MG/24 HR) 0.2 mg/24hr patch Place 1 patch (0.2 mg total) onto the skin once a week. 03/11/17  Yes Scot Jun, FNP  furosemide (LASIX) 20 MG tablet Take 20 mg by mouth daily.   Yes [provider]  gabapentin (NEURONTIN) 300 MG capsule Take 2 capsules (600 mg total) by mouth 3 (three) times daily. 03/11/17  Yes Scot Jun, FNP  Gabapentin, Once-Daily, 600 MG TABS Take 600 mg by mouth 3 (three) times daily. 09/07/16  Yes Micheline Chapman, NP  glimepiride (AMARYL) 4 MG tablet Take 4 mg by mouth 2 (two) times daily.   Yes [provider]  glucose blood (ONE TOUCH ULTRA TEST) test  strip Use as instructed 04/30/16  Yes Micheline Chapman, NP  hydrALAZINE (APRESOLINE) 100 MG tablet Take 1 tablet (100 mg total) by mouth 2 (two) times daily. 03/14/17  Yes Scot Jun, FNP  Insulin Glargine (LANTUS SOLOSTAR) 100 UNIT/ML Solostar Pen Inject 50 Units into the skin daily at 10 pm. 03/11/17  Yes Scot Jun, FNP  Insulin Pen Needle 29G X 12MM MISC E11.9 Dx Code Use once at bedtime with each nightly insulin 03/11/17  Yes Scot Jun, FNP  ketotifen (ZADITOR) 0.025 % ophthalmic solution Place 1 drop into both eyes 2 (two) times daily. 03/30/16  Yes Mabe, Shanon Brow, NP  Lancets [facility] ULTRASOFT) lancets Use as instructed 04/30/16  Yes Micheline Chapman, NP  omeprazole (PRILOSEC) 20 MG capsule Take 1 capsule (20 mg total) by mouth daily. Has to be taken at the same time with Harvoni FASTED 03/11/17  Yes Scot Jun, FNP  pravastatin (PRAVACHOL) 80 MG tablet Take 1 tablet (80 mg total) by mouth daily. 03/11/17  Yes Scot Jun, FNP  saxagliptin HCl (ONGLYZA) 2.5 MG TABS tablet Take 1 tablet (2.5 mg total) by mouth daily. 03/11/17  Yes Scot Jun, FNP  Travoprost, BAK Free, (TRAVATAN) 0.004 % SOLN ophthalmic solution Place 1 drop into both eyes at bedtime.   Yes [provider]  atenolol (TENORMIN) 25 MG tablet Take 1 tablet (25 mg total) by mouth daily. Patient not taking: Reported on 06/07/2017 03/11/17   Scot Jun, FNP  trimethoprim-polymyxin b (POLYTRIM) ophthalmic solution Place 1 drop into both eyes every 4 (four) hours. Patient not taking: Reported on 06/07/2017 03/30/16   Janne Napoleon, NP    Past Medical, Surgical Family and Social History reviewed and updated.    Objective:   Vitals:   06/07/17 1027  BP: (!) 176/90  Pulse: 92  Resp: 16  Temp: 97.8 F (36.6 C)  SpO2: 100%     Wt Readings from Last 3 Encounters:  06/07/17 139 lb 9.6 oz (63.3 kg)  05/10/17 141 lb (64 kg)  03/11/17 141 lb (64 kg)   Physical Exam   Constitutional: She is oriented to person, place, and time. She appears well-nourished. She appears ill.  HENT:  Head: Normocephalic and atraumatic.  Eyes: Pupils are equal, round, and reactive to light. Conjunctivae are normal. No scleral icterus.  Neck: Normal range of motion. Neck supple.  Cardiovascular: Normal rate, regular rhythm, normal heart sounds and intact distal pulses.   Pulmonary/Chest: Effort normal and breath sounds normal.  Musculoskeletal: Normal range of motion.  Lymphadenopathy:    She has no cervical adenopathy.  Neurological: She is alert and oriented to person, place, and time.  Skin: Skin is warm and dry.  Psychiatric: She has a normal mood and affect. Her  behavior is normal. Judgment and thought content normal.   Assessment & Plan:  1. Essential hypertension - CloNIDine (CATAPRES) tablet 0.1 mg; Take 1 tablet (0.1 mg total) by mouth once. -Metoprolol 25 mg twice daily for blood pressure management.  2. ESRD (end stage renal disease) ([place]) -Continue follow-up with nephrology.  3. Type 2 diabetes mellitus with stage 5 chronic kidney disease not on chronic dialysis, with long-term current use of insulin (HCC) - Glucose, capillary-113 -No changes to current regimen presently.   RTC: 1 week for hypertension follow-up  Carroll Sage. [name], MSN, FNP-C [facility]  [address] [place], [place] [address] [phone]

## 2017-06-07 NOTE — Patient Instructions (Signed)
Start metoprolol 25 mg twice daily for blood pressure. Continue all other medications as prescribed.

## 2017-06-09 ENCOUNTER — Encounter (HOSPITAL_COMMUNITY): Payer: Self-pay

## 2017-06-09 ENCOUNTER — Emergency Department (HOSPITAL_COMMUNITY)
Admission: EM | Admit: 2017-06-09 | Discharge: 2017-06-09 | Disposition: A | Discharge status: Home / Self Care | Payer: Medicare Other | Attending: Emergency Medicine | Admitting: Emergency Medicine

## 2017-06-09 DIAGNOSIS — R339 Retention of urine, unspecified: Secondary | ICD-10-CM | POA: Diagnosis present

## 2017-06-09 DIAGNOSIS — Z79899 Other long term (current) drug therapy: Secondary | ICD-10-CM | POA: Insufficient documentation

## 2017-06-09 DIAGNOSIS — E1140 Type 2 diabetes mellitus with diabetic neuropathy, unspecified: Secondary | ICD-10-CM | POA: Insufficient documentation

## 2017-06-09 DIAGNOSIS — I120 Hypertensive chronic kidney disease with stage 5 chronic kidney disease or end stage renal disease: Secondary | ICD-10-CM | POA: Diagnosis not present

## 2017-06-09 DIAGNOSIS — Z794 Long term (current) use of insulin: Secondary | ICD-10-CM | POA: Diagnosis not present

## 2017-06-09 DIAGNOSIS — F17210 Nicotine dependence, cigarettes, uncomplicated: Secondary | ICD-10-CM | POA: Insufficient documentation

## 2017-06-09 DIAGNOSIS — N186 End stage renal disease: Secondary | ICD-10-CM | POA: Diagnosis not present

## 2017-06-09 DIAGNOSIS — Z7902 Long term (current) use of antithrombotics/antiplatelets: Secondary | ICD-10-CM | POA: Diagnosis not present

## 2017-06-09 LAB — BASIC METABOLIC PANEL
Anion gap: 10 (ref 5–15)
BUN: 44 mg/dL — AB (ref 6–20)
CHLORIDE: 110 mmol/L (ref 101–111)
CO2: 18 mmol/L — ABNORMAL LOW (ref 22–32)
Calcium: 8.6 mg/dL — ABNORMAL LOW (ref 8.9–10.3)
Creatinine, Ser: 4.81 mg/dL — ABNORMAL HIGH (ref 0.44–1.00)
GFR calc Af Amer: 11 mL/min — ABNORMAL LOW (ref >60)
GFR calc non Af Amer: 10 mL/min — ABNORMAL LOW (ref >60)
GLUCOSE: 99 mg/dL (ref 65–99)
POTASSIUM: 4.9 mmol/L (ref 3.5–5.1)
Sodium: 138 mmol/L (ref 135–145)

## 2017-06-09 LAB — CBC
HCT: 33.9 % — ABNORMAL LOW (ref 36.0–46.0)
HEMOGLOBIN: 11.2 g/dL — AB (ref 12.0–15.0)
MCH: 29.1 pg (ref 26.0–34.0)
MCHC: 33 g/dL (ref 30.0–36.0)
MCV: 88.1 fL (ref 78.0–100.0)
Platelets: 260 10*3/uL (ref 150–400)
RBC: 3.85 MIL/uL — AB (ref 3.87–5.11)
RDW: 14.7 % (ref 11.5–15.5)
WBC: 8.6 10*3/uL (ref 4.0–10.5)

## 2017-06-09 LAB — URINALYSIS, ROUTINE W REFLEX MICROSCOPIC
BILIRUBIN URINE: NEGATIVE
Bacteria, UA: NONE SEEN
Glucose, UA: NEGATIVE mg/dL
HGB URINE DIPSTICK: NEGATIVE
KETONES UR: NEGATIVE mg/dL
LEUKOCYTES UA: NEGATIVE
NITRITE: NEGATIVE
PH: 5 (ref 5.0–8.0)
PROTEIN: 100 mg/dL — AB
SPECIFIC GRAVITY, URINE: 1.006 (ref 1.005–1.030)
Squamous Epithelial / LPF: NONE SEEN

## 2017-06-09 MED ORDER — LABETALOL HCL 100 MG PO TABS
100.0000 mg | ORAL_TABLET | Freq: Once | ORAL | Status: AC
  Administered 2017-06-09: 100 mg via ORAL
  Filled 2017-06-09: qty 1

## 2017-06-09 MED ORDER — HYDRALAZINE HCL 50 MG PO TABS
100.0000 mg | ORAL_TABLET | Freq: Once | ORAL | Status: AC
  Administered 2017-06-09: 100 mg via ORAL
  Filled 2017-06-09: qty 2

## 2017-06-09 NOTE — ED Triage Notes (Signed)
Patient reports the last time she was able to "have a stream of pee" was on Thursday 06/02/17. Patient reports, for the past couple days, she has "only had drops here and there." Patient states she is scheduled to have a dialysis shunt placed on 06/24/17.

## 2017-06-09 NOTE — Discharge Instructions (Signed)
1. Your kidney function is stable as compared to prior values. 2. Your urine does not show signs of infection. 3. Follow-up with your nephrologist as soon as possible. 4. Take your blood pressure medications as prescribed.

## 2017-06-09 NOTE — ED Notes (Signed)
Patient is alert and oriented x3.  She was given DC instructions and follow up visit instructions.  Patient gave verbal understanding. She was DC ambulatory under her own power to home.  V/S stable.  He was not showing any signs of distress on DC 

## 2017-06-09 NOTE — ED Provider Notes (Signed)
[facility] DEPT Provider Note   CSN: [id number] Arrival date & time: 06/09/17  1927     History   Chief Complaint Chief Complaint  Patient presents with  . Urinary Retention  . Generalized Body Aches    HPI Tammy Moses is a 51 y.o. female.  HPI Patient has end-stage kidney disease and will be getting a graft placed to eventually start dialysis. She reports that she's felt like she is not putting out urine is much as usual and her lower back hurts. She is worried that her kidneys have gotten worse. She wants to make sure that she won't have to get a central catheter placed to get emergent dialysis before her graft is done. No fever, no chills, no vomiting. Patient reports that she has not taken her p.m. blood pressure medications yet. No chest pain no short of breath Past Medical History:  Diagnosis Date  . Anemia   . Anxiety   . Blind right eye   . Constipation, chronic   . Dental caries   . Diabetes mellitus   . Diabetic neuropathy ([place])   . Diabetic retinopathy   . GERD (gastroesophageal reflux disease)   . Glaucoma   . H. pylori infection   . Hepatitis C carrier ([place])   . High risk sexual behavior   . Hyperlipidemia   . Hypertension   . Insomnia   . Microalbuminuria   . Nonspecific elevation of levels of transaminase or lactic acid dehydrogenase (LDH)   . Tobacco dependence   . Vitamin D deficiency     Patient Active Problem List   Diagnosis Date Noted  . Foot drop 05/26/2017  . Carpal tunnel syndrome of left wrist 05/20/2017  . 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 Peripheral neuropathy 05/10/2017  . Diabetes mellitus ([place]) 05/10/2017  . Gait abnormality 03/07/2014  . Abnormality of gait 03/07/2014  . Hepatitis C virus infection without hepatic coma 03/04/2014  . Erosive gastritis 02/14/2014  . BACK PAIN, LUMBAR 08/18/2010  . IDDM 08/11/2010  . HYPERCHOLESTEROLEMIA 08/11/2010  . HYPOKALEMIA 08/11/2010  . DEPRESSION 08/11/2010  .  GLAUCOMA 08/11/2010  . BLINDNESS, RIGHT EYE 08/11/2010  . HYPERTENSION 08/11/2010  . GERD 08/11/2010  . CONSTIPATION 08/06/2009    Past Surgical History:  Procedure Laterality Date  . ABDOMINAL HYSTERECTOMY  04/30/2009   PARTIAL  . COLONOSCOPY    . ESOPHAGOGASTRODUODENOSCOPY      OB History    No data available       Home Medications    Prior to Admission medications   Medication Sig Start Date End Date Taking? Authorizing Provider  albuterol (PROVENTIL HFA;VENTOLIN HFA) 108 (90 BASE) MCG/ACT inhaler Inhale 2 puffs into the lungs every 4 (four) hours as needed for shortness of breath. For shortness of breath   Yes [provider]  atropine 1 % ophthalmic solution Place 1 drop into both eyes 2 (two) times daily.   Yes [provider]  buPROPion (WELLBUTRIN SR) 150 MG 12 hr tablet Take 1 tablet (150 mg total) by mouth 2 (two) times daily. 03/11/17  Yes Scot Jun, FNP  Cholecalciferol (VITAMIN D-3) 1000 units CAPS Take 2,000 Units by mouth daily.   Yes [provider]  cloNIDine (CATAPRES - DOSED IN MG/24 HR) 0.2 mg/24hr patch Place 1 patch (0.2 mg total) onto the skin once a week. 03/11/17  Yes Scot Jun, FNP  furosemide (LASIX) 20 MG tablet Take 20 mg by mouth daily.   Yes  [provider]  Gabapentin, Once-Daily, 600 MG TABS Take 600 mg by mouth 3 (three) times daily. 09/07/16  Yes Micheline Chapman, NP  glimepiride (AMARYL) 4 MG tablet Take 4 mg by mouth 2 (two) times daily.   Yes [provider]  glucose blood (ONE TOUCH ULTRA TEST) test strip Use as instructed 04/30/16  Yes Micheline Chapman, NP  hydrALAZINE (APRESOLINE) 100 MG tablet Take 1 tablet (100 mg total) by mouth 2 (two) times daily. 03/14/17  Yes Scot Jun, FNP  Insulin Glargine (LANTUS SOLOSTAR) 100 UNIT/ML Solostar Pen Inject 50 Units into the skin daily at 10 pm. Patient taking differently: Inject 30 Units into the skin daily at 10 pm.  03/11/17   Yes Scot Jun, FNP  Insulin Pen Needle 29G X 12MM MISC E11.9 Dx Code Use once at bedtime with each nightly insulin 03/11/17  Yes Scot Jun, FNP  ketotifen (ZADITOR) 0.025 % ophthalmic solution Place 1 drop into both eyes 2 (two) times daily. 03/30/16  Yes Mabe, Shanon Brow, NP  labetalol (NORMODYNE) 200 MG tablet Take 400 mg by mouth daily. 06/07/17  Yes [provider]  Lancets [facility] ULTRASOFT) lancets Use as instructed 04/30/16  Yes Micheline Chapman, NP  omeprazole (PRILOSEC) 20 MG capsule Take 1 capsule (20 mg total) by mouth daily. Has to be taken at the same time with Harvoni FASTED 03/11/17  Yes Scot Jun, FNP  pravastatin (PRAVACHOL) 80 MG tablet Take 1 tablet (80 mg total) by mouth daily. 03/11/17  Yes Scot Jun, FNP  saxagliptin HCl (ONGLYZA) 2.5 MG TABS tablet Take 1 tablet (2.5 mg total) by mouth daily. 03/11/17  Yes Scot Jun, FNP  timolol (TIMOPTIC) 0.5 % ophthalmic solution Place 1 drop into the left eye daily. (100/1=100) 05/23/17  Yes [provider]  Travoprost, BAK Free, (TRAVATAN) 0.004 % SOLN ophthalmic solution Place 1 drop into both eyes at bedtime.   Yes [provider]  gabapentin (NEURONTIN) 300 MG capsule Take 2 capsules (600 mg total) by mouth 3 (three) times daily. Patient not taking: Reported on 06/09/2017 03/11/17   Scot Jun, FNP  metoprolol tartrate (LOPRESSOR) 25 MG tablet Take 1 tablet (25 mg total) by mouth 2 (two) times daily. Patient not taking: Reported on 06/09/2017 06/07/17   Scot Jun, FNP  trimethoprim-polymyxin b (POLYTRIM) ophthalmic solution Place 1 drop into both eyes every 4 (four) hours. Patient not taking: Reported on 06/07/2017 03/30/16   Janne Napoleon, NP    Family History Family History  Problem Relation Age of Onset  . High blood pressure Mother   . Diabetes Mother   . Thyroid disease Mother   . Diabetes Father   . High blood pressure Father     Social  History Social History  Substance Use Topics  . Smoking status: Current Every Day Smoker    Packs/day: 0.25    Types: Cigarettes  . Smokeless tobacco: Never Used     Comment: trying to quit  . Alcohol use Yes     Comment: wine     Allergies   Patient has no known allergies.   Review of Systems Review of Systems 10 Systems reviewed and are negative for acute change except as noted in the HPI.  Physical Exam Updated Vital Signs BP (!) 160/83   Pulse 86   Temp 98 F (36.7 C) (Oral)   Resp 17   Ht 5\' 2"  (1.575 m)   Wt 63 kg (139  lb)   SpO2 99%   BMI 25.42 kg/m   Physical Exam  Constitutional: She is oriented to person, place, and time. She appears well-developed and well-nourished. No distress.  HENT:  Head: Normocephalic and atraumatic.  Eyes:  Right cornea opacified. Left eye injected sclera  Neck: Neck supple.  Cardiovascular: Normal rate and regular rhythm.   No murmur heard. Pulmonary/Chest: Effort normal and breath sounds normal. No respiratory distress.  Abdominal: Soft. She exhibits no distension. There is no tenderness. There is no guarding.  Musculoskeletal: Normal range of motion. She exhibits no edema or tenderness.  Neurological: She is alert and oriented to person, place, and time. No cranial nerve deficit. She exhibits normal muscle tone. Coordination normal.  Skin: Skin is warm and dry.  Psychiatric: She has a normal mood and affect.  Nursing note and vitals reviewed.    ED Treatments / Results  Labs (all labs ordered are listed, but only abnormal results are displayed) Labs Reviewed  URINALYSIS, ROUTINE W REFLEX MICROSCOPIC - Abnormal; Notable for the following:       Result Value   Color, Urine STRAW (*)    Protein, ur 100 (*)    All other components within normal limits  CBC - Abnormal; Notable for the following:    RBC 3.85 (*)    Hemoglobin 11.2 (*)    HCT 33.9 (*)    All other components within normal limits  BASIC METABOLIC PANEL -  Abnormal; Notable for the following:    CO2 18 (*)    BUN 44 (*)    Creatinine, Ser 4.81 (*)    Calcium 8.6 (*)    GFR calc non Af Amer 10 (*)    GFR calc Af Amer 11 (*)    All other components within normal limits    EKG  EKG Interpretation None       Radiology No results found.  Procedures Procedures (including critical care time)  Medications Ordered in ED Medications  hydrALAZINE (APRESOLINE) tablet 100 mg (100 mg Oral Given 06/09/17 2057)  labetalol (NORMODYNE) tablet 100 mg (100 mg Oral Given 06/09/17 2057)     Initial Impression / Assessment and Plan / ED Course  I have reviewed the triage vital signs and the nursing notes.  Pertinent labs & imaging results that were available during my care of the patient were reviewed by me and considered in my medical decision making (see chart for details).      Final Clinical Impressions(s) / ED Diagnoses   Final diagnoses:  ESRD (end stage renal disease) ([place])   Patient's bladder scan showed 200 mL. Urinalysis obtained and no signs of infection. Patient's renal function is stable. Patient is nontoxic. Blood pressures responded well to her evening medications. Patient is stable to follow-up with her nephrologist on an outpatient basis. New Prescriptions New Prescriptions   No medications on file     Charlesetta Shanks, MD 06/09/17 2322

## 2017-06-09 NOTE — ED Triage Notes (Signed)
Pt from homeless shelter via EMS.  Multiple complaints.  Pt reports urinary retention for several days, hx kidney failure and waiting to start dialysis.  No indwelling foley cath.  Also c/o head, back, and leg pain. VS: 168/100, 82 bpm, 88 CBG

## 2017-06-14 ENCOUNTER — Ambulatory Visit: Payer: Medicare Other | Admitting: Family Medicine

## 2017-06-16 ENCOUNTER — Ambulatory Visit: Payer: Medicare Other | Admitting: Physical Therapy

## 2017-06-16 ENCOUNTER — Ambulatory Visit: Payer: Medicare Other | Attending: Neurology | Admitting: Occupational Therapy

## 2017-06-17 DIAGNOSIS — Z992 Dependence on renal dialysis: Secondary | ICD-10-CM

## 2017-06-17 DIAGNOSIS — N186 End stage renal disease: Secondary | ICD-10-CM | POA: Insufficient documentation

## 2017-06-24 DIAGNOSIS — J449 Chronic obstructive pulmonary disease, unspecified: Secondary | ICD-10-CM | POA: Insufficient documentation

## 2017-06-25 HISTORY — PX: DIALYSIS FISTULA CREATION: SHX611

## 2017-06-28 ENCOUNTER — Encounter: Payer: Self-pay | Admitting: Pediatric Intensive Care

## 2017-06-29 ENCOUNTER — Telehealth: Payer: Self-pay

## 2017-06-29 DIAGNOSIS — Z7409 Other reduced mobility: Secondary | ICD-10-CM

## 2017-06-29 NOTE — Telephone Encounter (Signed)
Patient needs a order for a walker and a hospital bed. Patient will be moving out the shelter and will be living by herself.

## 2017-06-30 NOTE — Telephone Encounter (Signed)
Completed. Patient can pick-up 07/01/2017

## 2017-07-01 ENCOUNTER — Telehealth: Payer: Self-pay | Admitting: Family Medicine

## 2017-07-01 DIAGNOSIS — Z7409 Other reduced mobility: Secondary | ICD-10-CM

## 2017-07-01 NOTE — Telephone Encounter (Signed)
Patient needs a hospital bed due to immobility problems.  Tammy Moses. [name], MSN, FNP-C [facility]  [address] [place], [place] [address] [phone]

## 2017-07-04 NOTE — Congregational Nurse Program (Signed)
Congregational Nurse Program Note  Date of Encounter: 06/13/2017  Past Medical History: Past Medical History:  Diagnosis Date  . Anemia   . Anxiety   . Blind right eye   . Constipation, chronic   . Dental caries   . Diabetes mellitus   . Diabetic neuropathy ([place])   . Diabetic retinopathy   . GERD (gastroesophageal reflux disease)   . Glaucoma   . H. pylori infection   . Hepatitis C carrier ([place])   . High risk sexual behavior   . Hyperlipidemia   . Hypertension   . Insomnia   . Microalbuminuria   . Nonspecific elevation of levels of transaminase or lactic acid dehydrogenase (LDH)   . Tobacco dependence   . Vitamin D deficiency     Encounter Details:     CNP Questionnaire - 06/13/17 1204      Patient Demographics   Is this a new or existing patient? New   Patient is considered a/an Not Applicable   Race American Indian/[place] Native     Patient Assistance   Location of Patient Assistance Not Applicable   Patient's financial/insurance status Low Income;Medicaid   Uninsured Patient ([facility]) No   Patient referred to apply for the following financial assistance Not Applicable   Food insecurities addressed Not Applicable   Transportation assistance Yes   Type of Assistance Bus Pass Given   Assistance securing medications No   Educational health offerings Chronic disease     Encounter Details   Primary purpose of visit [place]   Was an Emergency Department visit averted? Not Applicable   Patient referred to Not Applicable   Was a mental health screening completed? (GAINS tool) No   Does patient have dental issues? No   Does patient have vision issues? No   Does your patient have an abnormal blood pressure today? No   Since previous encounter, have you referred patient for abnormal blood pressure that resulted in a new diagnosis or medication change? No   Does your patient have an abnormal blood glucose today? No   Since  previous encounter, have you referred patient for abnormal blood glucose that resulted in a new diagnosis or medication change? No   Was there a life-saving intervention made? No     Requested information about housing and transportation.  Provided information about the [employer] and [employer].  Bus passes given to get to next HCP appointment

## 2017-07-07 ENCOUNTER — Telehealth: Payer: Self-pay | Admitting: Family Medicine

## 2017-07-07 NOTE — Telephone Encounter (Signed)
Paperwork completed for advance home care indicting diagnosis associated with prescription for hospital bed.  Tammy Moses. [name], MSN, FNP-C [facility]  [address] [place],  [address] [phone]

## 2017-07-11 ENCOUNTER — Observation Stay (HOSPITAL_COMMUNITY)
Admission: EM | Admit: 2017-07-11 | Discharge: 2017-07-13 | Disposition: A | Discharge status: Home / Self Care | Payer: Medicare Other | Attending: Internal Medicine | Admitting: Internal Medicine

## 2017-07-11 ENCOUNTER — Encounter (HOSPITAL_COMMUNITY): Payer: Self-pay

## 2017-07-11 DIAGNOSIS — R269 Unspecified abnormalities of gait and mobility: Secondary | ICD-10-CM | POA: Diagnosis not present

## 2017-07-11 DIAGNOSIS — H5440 Blindness, one eye, unspecified eye: Secondary | ICD-10-CM | POA: Diagnosis present

## 2017-07-11 DIAGNOSIS — E1140 Type 2 diabetes mellitus with diabetic neuropathy, unspecified: Secondary | ICD-10-CM | POA: Diagnosis not present

## 2017-07-11 DIAGNOSIS — Z23 Encounter for immunization: Secondary | ICD-10-CM | POA: Insufficient documentation

## 2017-07-11 DIAGNOSIS — H42 Glaucoma in diseases classified elsewhere: Secondary | ICD-10-CM | POA: Insufficient documentation

## 2017-07-11 DIAGNOSIS — I120 Hypertensive chronic kidney disease with stage 5 chronic kidney disease or end stage renal disease: Secondary | ICD-10-CM | POA: Diagnosis not present

## 2017-07-11 DIAGNOSIS — M21371 Foot drop, right foot: Secondary | ICD-10-CM | POA: Insufficient documentation

## 2017-07-11 DIAGNOSIS — F17210 Nicotine dependence, cigarettes, uncomplicated: Secondary | ICD-10-CM | POA: Diagnosis not present

## 2017-07-11 DIAGNOSIS — D649 Anemia, unspecified: Secondary | ICD-10-CM

## 2017-07-11 DIAGNOSIS — E1122 Type 2 diabetes mellitus with diabetic chronic kidney disease: Secondary | ICD-10-CM

## 2017-07-11 DIAGNOSIS — Y9289 Other specified places as the place of occurrence of the external cause: Secondary | ICD-10-CM | POA: Diagnosis not present

## 2017-07-11 DIAGNOSIS — E559 Vitamin D deficiency, unspecified: Secondary | ICD-10-CM | POA: Diagnosis not present

## 2017-07-11 DIAGNOSIS — F10920 Alcohol use, unspecified with intoxication, uncomplicated: Secondary | ICD-10-CM

## 2017-07-11 DIAGNOSIS — I10 Essential (primary) hypertension: Secondary | ICD-10-CM | POA: Diagnosis present

## 2017-07-11 DIAGNOSIS — W19XXXA Unspecified fall, initial encounter: Secondary | ICD-10-CM

## 2017-07-11 DIAGNOSIS — E11649 Type 2 diabetes mellitus with hypoglycemia without coma: Secondary | ICD-10-CM | POA: Diagnosis not present

## 2017-07-11 DIAGNOSIS — Z794 Long term (current) use of insulin: Secondary | ICD-10-CM | POA: Insufficient documentation

## 2017-07-11 DIAGNOSIS — Z79899 Other long term (current) drug therapy: Secondary | ICD-10-CM | POA: Diagnosis not present

## 2017-07-11 DIAGNOSIS — H5461 Unqualified visual loss, right eye, normal vision left eye: Secondary | ICD-10-CM | POA: Insufficient documentation

## 2017-07-11 DIAGNOSIS — N185 Chronic kidney disease, stage 5: Secondary | ICD-10-CM | POA: Diagnosis not present

## 2017-07-11 DIAGNOSIS — G4700 Insomnia, unspecified: Secondary | ICD-10-CM | POA: Insufficient documentation

## 2017-07-11 DIAGNOSIS — F419 Anxiety disorder, unspecified: Secondary | ICD-10-CM | POA: Diagnosis not present

## 2017-07-11 DIAGNOSIS — H547 Unspecified visual loss: Secondary | ICD-10-CM | POA: Diagnosis present

## 2017-07-11 DIAGNOSIS — R55 Syncope and collapse: Secondary | ICD-10-CM | POA: Insufficient documentation

## 2017-07-11 DIAGNOSIS — W010XXA Fall on same level from slipping, tripping and stumbling without subsequent striking against object, initial encounter: Secondary | ICD-10-CM | POA: Diagnosis not present

## 2017-07-11 DIAGNOSIS — Y905 Blood alcohol level of 100-119 mg/100 ml: Secondary | ICD-10-CM | POA: Diagnosis not present

## 2017-07-11 DIAGNOSIS — E119 Type 2 diabetes mellitus without complications: Secondary | ICD-10-CM

## 2017-07-11 DIAGNOSIS — E785 Hyperlipidemia, unspecified: Secondary | ICD-10-CM | POA: Insufficient documentation

## 2017-07-11 DIAGNOSIS — E162 Hypoglycemia, unspecified: Secondary | ICD-10-CM | POA: Diagnosis present

## 2017-07-11 DIAGNOSIS — E875 Hyperkalemia: Secondary | ICD-10-CM | POA: Diagnosis not present

## 2017-07-11 DIAGNOSIS — D631 Anemia in chronic kidney disease: Secondary | ICD-10-CM | POA: Diagnosis not present

## 2017-07-11 DIAGNOSIS — K219 Gastro-esophageal reflux disease without esophagitis: Secondary | ICD-10-CM | POA: Diagnosis present

## 2017-07-11 DIAGNOSIS — R011 Cardiac murmur, unspecified: Secondary | ICD-10-CM | POA: Diagnosis not present

## 2017-07-11 DIAGNOSIS — F10120 Alcohol abuse with intoxication, uncomplicated: Secondary | ICD-10-CM | POA: Diagnosis not present

## 2017-07-11 DIAGNOSIS — Z992 Dependence on renal dialysis: Secondary | ICD-10-CM

## 2017-07-11 DIAGNOSIS — E1139 Type 2 diabetes mellitus with other diabetic ophthalmic complication: Secondary | ICD-10-CM | POA: Insufficient documentation

## 2017-07-11 DIAGNOSIS — B182 Chronic viral hepatitis C: Secondary | ICD-10-CM | POA: Diagnosis not present

## 2017-07-11 HISTORY — DX: Hypoglycemia, unspecified: E16.2

## 2017-07-11 LAB — CBC WITH DIFFERENTIAL/PLATELET
Basophils Absolute: 0 10*3/uL (ref 0.0–0.1)
Basophils Relative: 0 %
EOS PCT: 3 %
Eosinophils Absolute: 0.2 10*3/uL (ref 0.0–0.7)
HEMATOCRIT: 29.1 % — AB (ref 36.0–46.0)
Hemoglobin: 9.4 g/dL — ABNORMAL LOW (ref 12.0–15.0)
LYMPHS ABS: 3.1 10*3/uL (ref 0.7–4.0)
LYMPHS PCT: 50 %
MCH: 28.6 pg (ref 26.0–34.0)
MCHC: 32.3 g/dL (ref 30.0–36.0)
MCV: 88.4 fL (ref 78.0–100.0)
MONO ABS: 0.4 10*3/uL (ref 0.1–1.0)
MONOS PCT: 7 %
NEUTROS ABS: 2.4 10*3/uL (ref 1.7–7.7)
Neutrophils Relative %: 40 %
PLATELETS: 270 10*3/uL (ref 150–400)
RBC: 3.29 MIL/uL — AB (ref 3.87–5.11)
RDW: 15.1 % (ref 11.5–15.5)
WBC: 6.2 10*3/uL (ref 4.0–10.5)

## 2017-07-11 LAB — CBG MONITORING, ED: Glucose-Capillary: 80 mg/dL (ref 65–99)

## 2017-07-11 NOTE — ED Provider Notes (Signed)
[facility] DEPT Provider Note   CSN: [id number] Arrival date & time: 07/11/17  2211     History   Chief Complaint Chief Complaint  Patient presents with  . Fall  . Hypoglycemia    HPI Tammy Moses is a 51 y.o. female.  Patient arrives by EMS after fall at home. She states "I fell because of alcohol". She feels she tripped over something and fell onto left arm. The patient has slurred speech that is slow, and is somnolent c/w significant alcohol intoxication. She is an unreliable historian. She currently denies abdominal, neck or chest pain. Per EMS, CBG 36 on their arrival. D10 given with improvement. No apparent vomiting or bleeding wound.    The history is provided by the patient. No language interpreter was used.    Past Medical History:  Diagnosis Date  . Anemia   . Anxiety   . Blind right eye   . Constipation, chronic   . Dental caries   . Diabetes mellitus   . Diabetic neuropathy ([place])   . Diabetic retinopathy   . GERD (gastroesophageal reflux disease)   . Glaucoma   . H. pylori infection   . Hepatitis C carrier ([place])   . High risk sexual behavior   . Hyperlipidemia   . Hypertension   . Insomnia   . Microalbuminuria   . Nonspecific elevation of levels of transaminase or lactic acid dehydrogenase (LDH)   . Tobacco dependence   . Vitamin D deficiency     Patient Active Problem List   Diagnosis Date Noted  . Foot drop 05/26/2017  . Carpal tunnel syndrome of left wrist 05/20/2017  . 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 Peripheral neuropathy 05/10/2017  . Diabetes mellitus ([place]) 05/10/2017  . Gait abnormality 03/07/2014  . Abnormality of gait 03/07/2014  . Hepatitis C virus infection without hepatic coma 03/04/2014  . Erosive gastritis 02/14/2014  . BACK PAIN, LUMBAR 08/18/2010  . IDDM 08/11/2010  . HYPERCHOLESTEROLEMIA 08/11/2010  . HYPOKALEMIA 08/11/2010  . DEPRESSION 08/11/2010  . GLAUCOMA 08/11/2010  . BLINDNESS, RIGHT EYE  08/11/2010  . HYPERTENSION 08/11/2010  . GERD 08/11/2010  . CONSTIPATION 08/06/2009    Past Surgical History:  Procedure Laterality Date  . ABDOMINAL HYSTERECTOMY  04/30/2009   PARTIAL  . COLONOSCOPY    . ESOPHAGOGASTRODUODENOSCOPY      OB History    No data available       Home Medications    Prior to Admission medications   Medication Sig Start Date End Date Taking? Authorizing Provider  albuterol (PROVENTIL HFA;VENTOLIN HFA) 108 (90 BASE) MCG/ACT inhaler Inhale 2 puffs into the lungs every 4 (four) hours as needed for shortness of breath. For shortness of breath    [provider]  atropine 1 % ophthalmic solution Place 1 drop into both eyes 2 (two) times daily.    [provider]  buPROPion (WELLBUTRIN SR) 150 MG 12 hr tablet Take 1 tablet (150 mg total) by mouth 2 (two) times daily. 03/11/17   Scot Jun, FNP  Cholecalciferol (VITAMIN D-3) 1000 units CAPS Take 2,000 Units by mouth daily.    [provider]  cloNIDine (CATAPRES - DOSED IN MG/24 HR) 0.2 mg/24hr patch Place 1 patch (0.2 mg total) onto the skin once a week. 03/11/17   Scot Jun, FNP  furosemide (LASIX) 20 MG tablet Take 20 mg by mouth daily.    [provider]  gabapentin (NEURONTIN) 300 MG capsule Take 2  capsules (600 mg total) by mouth 3 (three) times daily. Patient not taking: Reported on 06/09/2017 03/11/17   Scot Jun, FNP  Gabapentin, Once-Daily, 600 MG TABS Take 600 mg by mouth 3 (three) times daily. 09/07/16   Micheline Chapman, NP  glimepiride (AMARYL) 4 MG tablet Take 4 mg by mouth 2 (two) times daily.    [provider]  glucose blood (ONE TOUCH ULTRA TEST) test strip Use as instructed 04/30/16   Micheline Chapman, NP  hydrALAZINE (APRESOLINE) 100 MG tablet Take 1 tablet (100 mg total) by mouth 2 (two) times daily. 03/14/17   Scot Jun, FNP  Insulin Glargine (LANTUS SOLOSTAR) 100 UNIT/ML Solostar Pen Inject 50 Units into the  skin daily at 10 pm. Patient taking differently: Inject 30 Units into the skin daily at 10 pm.  03/11/17   Scot Jun, FNP  Insulin Pen Needle 29G X 12MM MISC E11.9 Dx Code Use once at bedtime with each nightly insulin 03/11/17   Scot Jun, FNP  ketotifen (ZADITOR) 0.025 % ophthalmic solution Place 1 drop into both eyes 2 (two) times daily. 03/30/16   Janne Napoleon, NP  labetalol (NORMODYNE) 200 MG tablet Take 400 mg by mouth daily. 06/07/17   [provider]  Lancets Glory Rosebush ULTRASOFT) lancets Use as instructed 04/30/16   Micheline Chapman, NP  metoprolol tartrate (LOPRESSOR) 25 MG tablet Take 1 tablet (25 mg total) by mouth 2 (two) times daily. Patient not taking: Reported on 06/09/2017 06/07/17   Scot Jun, FNP  omeprazole (PRILOSEC) 20 MG capsule Take 1 capsule (20 mg total) by mouth daily. Has to be taken at the same time with Harvoni FASTED 03/11/17   Scot Jun, FNP  pravastatin (PRAVACHOL) 80 MG tablet Take 1 tablet (80 mg total) by mouth daily. 03/11/17   Scot Jun, FNP  saxagliptin HCl (ONGLYZA) 2.5 MG TABS tablet Take 1 tablet (2.5 mg total) by mouth daily. 03/11/17   Scot Jun, FNP  timolol (TIMOPTIC) 0.5 % ophthalmic solution Place 1 drop into the left eye daily. (100/1=100) 05/23/17   [provider]  Travoprost, BAK Free, (TRAVATAN) 0.004 % SOLN ophthalmic solution Place 1 drop into both eyes at bedtime.    [provider]  trimethoprim-polymyxin b (POLYTRIM) ophthalmic solution Place 1 drop into both eyes every 4 (four) hours. Patient not taking: Reported on 06/07/2017 03/30/16   Janne Napoleon, NP    Family History Family History  Problem Relation Age of Onset  . High blood pressure Mother   . Diabetes Mother   . Thyroid disease Mother   . Diabetes Father   . High blood pressure Father     Social History Social History  Substance Use Topics  . Smoking status: Current Every Day Smoker    Packs/day: 0.25     Types: Cigarettes  . Smokeless tobacco: Never Used     Comment: trying to quit  . Alcohol use Yes     Comment: wine     Allergies   Patient has no known allergies.   Review of Systems Review of Systems  Reason unable to perform ROS: Altered mental status.     Physical Exam Updated Vital Signs BP (!) 142/73 (BP Location: Right Arm)   Pulse 63   Temp 98.7 F (37.1 C) (Oral)   Resp 18   Ht 5\' 2"  (1.575 m)   Wt 63 kg (138 lb 14.2 oz)   SpO2 100%   BMI  25.40 kg/m   Physical Exam  Constitutional: She appears well-developed and well-nourished.  Somnolent, easily awakened. Slow to respons verbally. Follows command.   HENT:  Head: Normocephalic and atraumatic.  Neck: Normal range of motion. Neck supple.  Cardiovascular: Normal rate and regular rhythm.   Pulmonary/Chest: Effort normal and breath sounds normal.  Abdominal: Soft. Bowel sounds are normal. There is no tenderness. There is no rebound and no guarding.  Musculoskeletal: Normal range of motion. She exhibits no edema.  LUE has stapled surgical site proximal to [facility] area. Palpable thrill distal upper arm.   Neurological:  Neurologic exam limited due to AMS  Skin: Skin is warm and dry. No rash noted.  Psychiatric: She has a normal mood and affect.     ED Treatments / Results  Labs (all labs ordered are listed, but only abnormal results are displayed) Labs Reviewed  CBC WITH DIFFERENTIAL/PLATELET - Abnormal; Notable for the following:       Result Value   RBC 3.29 (*)    Hemoglobin 9.4 (*)    HCT 29.1 (*)    All other components within normal limits  BASIC METABOLIC PANEL  TROPONIN I  ETHANOL  RAPID URINE DRUG SCREEN, HOSP PERFORMED  CBG MONITORING, ED  CBG MONITORING, ED    EKG  EKG Interpretation None       Radiology No results found.  Procedures Procedures (including critical care time)  Medications Ordered in ED Medications - No data to display   Initial Impression / Assessment and Plan  / ED Course  I have reviewed the triage vital signs and the nursing notes.  Pertinent labs & imaging results that were available during my care of the patient were reviewed by me and considered in my medical decision making (see chart for details).     Patient here after fall, found hypoglycemic at home to 36. She denies having taken her insulin today. She admits to excessive alcohol use. AMS presumed due to intoxication.   CT head and neck ordered. CBG 80 on arrival. She has had orange juice. Will give meal and keep close watch on repeated CBG's.   1:00 - patient's CBG is not improving. Amp D50 provided (after CBG of 30), D5 1/2 NS infusion started. On re-evaluation, she is more alert, speaking with less slurring of words. History of fall is consistent with initial interview.   2:15 - She continues to improve and becomes more awake, alert, and coherent. Neuro exam with without deficit: CN's 3-12 grossly intact. Speech is clear and focused. No facial asymmetry. No lateralizing weakness. No deficits of coordination. Ambulatory without imbalance.   4:30 - Blood sugar 112. D5 1/2 NS fluids stopped. Will continue to monitor CBG.  5:30 - CBG 93. Ambulatory to the bathroom. She has returned to baseline mental status. She states she cannot remember if she took too much insulin last night. Feel another CBG warranted to insure no further hypoglycemia.   7:00 - CBG again decreases although in normal range at 75. Will order breakfast tray and monitor for another 1-2 hours. Patient care signed out to Delos Haring, PA-C.   Final Clinical Impressions(s) / ED Diagnoses   Final diagnoses:  None   1. Hypoglycemia 2. Alcohol intoxication 3. Fall   New Prescriptions New Prescriptions   No medications on file     Charlann Lange, Hershal Coria 07/12/17 0710    Ezequiel Essex, MD 07/12/17 [phone]

## 2017-07-11 NOTE — ED Notes (Addendum)
Patient unwilling to stand up for orthostatic VS.  Patient aware they need to have them done soon.

## 2017-07-11 NOTE — ED Triage Notes (Signed)
Patient here for evaluation of left arm pain after a fall.  Patient has had a dialysis access placed on the left arm and there is pain in that area.  No bleeding or obvious deformity. Patient also was hypoglycemia for EMS at 36, resolved to 123 after D10.  80 on arrival to ED.  ETOH on board, patient in C collar.

## 2017-07-12 ENCOUNTER — Emergency Department (HOSPITAL_COMMUNITY): Payer: Medicare Other

## 2017-07-12 ENCOUNTER — Encounter (HOSPITAL_COMMUNITY): Payer: Self-pay | Admitting: Internal Medicine

## 2017-07-12 DIAGNOSIS — H42 Glaucoma in diseases classified elsewhere: Secondary | ICD-10-CM

## 2017-07-12 DIAGNOSIS — D649 Anemia, unspecified: Secondary | ICD-10-CM | POA: Diagnosis not present

## 2017-07-12 DIAGNOSIS — H409 Unspecified glaucoma: Secondary | ICD-10-CM | POA: Diagnosis not present

## 2017-07-12 DIAGNOSIS — E1139 Type 2 diabetes mellitus with other diabetic ophthalmic complication: Secondary | ICD-10-CM | POA: Diagnosis not present

## 2017-07-12 DIAGNOSIS — N185 Chronic kidney disease, stage 5: Secondary | ICD-10-CM

## 2017-07-12 DIAGNOSIS — R011 Cardiac murmur, unspecified: Secondary | ICD-10-CM | POA: Diagnosis not present

## 2017-07-12 DIAGNOSIS — F1910 Other psychoactive substance abuse, uncomplicated: Secondary | ICD-10-CM

## 2017-07-12 DIAGNOSIS — I120 Hypertensive chronic kidney disease with stage 5 chronic kidney disease or end stage renal disease: Secondary | ICD-10-CM

## 2017-07-12 DIAGNOSIS — E1122 Type 2 diabetes mellitus with diabetic chronic kidney disease: Secondary | ICD-10-CM | POA: Diagnosis not present

## 2017-07-12 DIAGNOSIS — Z90711 Acquired absence of uterus with remaining cervical stump: Secondary | ICD-10-CM

## 2017-07-12 DIAGNOSIS — E875 Hyperkalemia: Secondary | ICD-10-CM

## 2017-07-12 DIAGNOSIS — Z8249 Family history of ischemic heart disease and other diseases of the circulatory system: Secondary | ICD-10-CM

## 2017-07-12 DIAGNOSIS — Z79899 Other long term (current) drug therapy: Secondary | ICD-10-CM

## 2017-07-12 DIAGNOSIS — M21371 Foot drop, right foot: Secondary | ICD-10-CM

## 2017-07-12 DIAGNOSIS — E11649 Type 2 diabetes mellitus with hypoglycemia without coma: Secondary | ICD-10-CM

## 2017-07-12 DIAGNOSIS — Z794 Long term (current) use of insulin: Secondary | ICD-10-CM

## 2017-07-12 DIAGNOSIS — F10920 Alcohol use, unspecified with intoxication, uncomplicated: Secondary | ICD-10-CM

## 2017-07-12 DIAGNOSIS — R55 Syncope and collapse: Secondary | ICD-10-CM

## 2017-07-12 DIAGNOSIS — B1920 Unspecified viral hepatitis C without hepatic coma: Secondary | ICD-10-CM

## 2017-07-12 DIAGNOSIS — Z8349 Family history of other endocrine, nutritional and metabolic diseases: Secondary | ICD-10-CM

## 2017-07-12 DIAGNOSIS — E162 Hypoglycemia, unspecified: Secondary | ICD-10-CM

## 2017-07-12 DIAGNOSIS — Z833 Family history of diabetes mellitus: Secondary | ICD-10-CM

## 2017-07-12 DIAGNOSIS — F17210 Nicotine dependence, cigarettes, uncomplicated: Secondary | ICD-10-CM

## 2017-07-12 DIAGNOSIS — D631 Anemia in chronic kidney disease: Secondary | ICD-10-CM | POA: Insufficient documentation

## 2017-07-12 DIAGNOSIS — Z9181 History of falling: Secondary | ICD-10-CM

## 2017-07-12 HISTORY — DX: Hypoglycemia, unspecified: E16.2

## 2017-07-12 LAB — RENAL FUNCTION PANEL
Albumin: 3.5 g/dL (ref 3.5–5.0)
Anion gap: 9 (ref 5–15)
BUN: 43 mg/dL — ABNORMAL HIGH (ref 6–20)
CALCIUM: 8.3 mg/dL — AB (ref 8.9–10.3)
CO2: 15 mmol/L — AB (ref 22–32)
CREATININE: 4.39 mg/dL — AB (ref 0.44–1.00)
Chloride: 108 mmol/L (ref 101–111)
GFR calc Af Amer: 12 mL/min — ABNORMAL LOW (ref >60)
GFR calc non Af Amer: 11 mL/min — ABNORMAL LOW (ref >60)
GLUCOSE: 144 mg/dL — AB (ref 65–99)
Phosphorus: 4.6 mg/dL (ref 2.5–4.6)
Potassium: 5.7 mmol/L — ABNORMAL HIGH (ref 3.5–5.1)
SODIUM: 132 mmol/L — AB (ref 135–145)

## 2017-07-12 LAB — IRON AND TIBC
Iron: 58 ug/dL (ref 28–170)
SATURATION RATIOS: 22 % (ref 10.4–31.8)
TIBC: 269 ug/dL (ref 250–450)
UIBC: 211 ug/dL

## 2017-07-12 LAB — URINALYSIS, ROUTINE W REFLEX MICROSCOPIC
BILIRUBIN URINE: NEGATIVE
GLUCOSE, UA: NEGATIVE mg/dL
KETONES UR: NEGATIVE mg/dL
Leukocytes, UA: NEGATIVE
Nitrite: NEGATIVE
PH: 6 (ref 5.0–8.0)
Protein, ur: 100 mg/dL — AB
SPECIFIC GRAVITY, URINE: 1.01 (ref 1.005–1.030)

## 2017-07-12 LAB — URINALYSIS, MICROSCOPIC (REFLEX)

## 2017-07-12 LAB — BASIC METABOLIC PANEL
Anion gap: 10 (ref 5–15)
BUN: 46 mg/dL — AB (ref 6–20)
CHLORIDE: 108 mmol/L (ref 101–111)
CO2: 14 mmol/L — ABNORMAL LOW (ref 22–32)
CREATININE: 4.65 mg/dL — AB (ref 0.44–1.00)
Calcium: 8.2 mg/dL — ABNORMAL LOW (ref 8.9–10.3)
GFR calc Af Amer: 12 mL/min — ABNORMAL LOW (ref >60)
GFR, EST NON AFRICAN AMERICAN: 10 mL/min — AB (ref >60)
GLUCOSE: 51 mg/dL — AB (ref 65–99)
Potassium: 4.7 mmol/L (ref 3.5–5.1)
SODIUM: 132 mmol/L — AB (ref 135–145)

## 2017-07-12 LAB — GLUCOSE, CAPILLARY: GLUCOSE-CAPILLARY: 176 mg/dL — AB (ref 65–99)

## 2017-07-12 LAB — RAPID URINE DRUG SCREEN, HOSP PERFORMED
Amphetamines: NOT DETECTED
BARBITURATES: NOT DETECTED
BENZODIAZEPINES: NOT DETECTED
Cocaine: NOT DETECTED
OPIATES: NOT DETECTED
TETRAHYDROCANNABINOL: NOT DETECTED

## 2017-07-12 LAB — CBG MONITORING, ED
GLUCOSE-CAPILLARY: 75 mg/dL (ref 65–99)
GLUCOSE-CAPILLARY: 52 mg/dL — AB (ref 65–99)
GLUCOSE-CAPILLARY: 175 mg/dL — AB (ref 65–99)
Glucose-Capillary: 112 mg/dL — ABNORMAL HIGH (ref 65–99)
Glucose-Capillary: 145 mg/dL — ABNORMAL HIGH (ref 65–99)
Glucose-Capillary: 182 mg/dL — ABNORMAL HIGH (ref 65–99)
Glucose-Capillary: 184 mg/dL — ABNORMAL HIGH (ref 65–99)
Glucose-Capillary: 30 mg/dL — CL (ref 65–99)
Glucose-Capillary: 82 mg/dL (ref 65–99)
Glucose-Capillary: 93 mg/dL (ref 65–99)

## 2017-07-12 LAB — POTASSIUM: Potassium: 5.6 mmol/L — ABNORMAL HIGH (ref 3.5–5.1)

## 2017-07-12 LAB — HEMOGLOBIN A1C
HEMOGLOBIN A1C: 6.5 % — AB (ref 4.8–5.6)
MEAN PLASMA GLUCOSE: 139.85 mg/dL

## 2017-07-12 LAB — FERRITIN: FERRITIN: 101 ng/mL (ref 11–307)

## 2017-07-12 LAB — TROPONIN I

## 2017-07-12 LAB — ETHANOL: ALCOHOL ETHYL (B): 109 mg/dL — AB (ref <5)

## 2017-07-12 MED ORDER — POLYETHYLENE GLYCOL 3350 17 GM/SCOOP PO POWD: 1.0000 | Freq: Once | ORAL | Status: DC

## 2017-07-12 MED ORDER — ACETAMINOPHEN 650 MG RE SUPP
650.0000 mg | Freq: Four times a day (QID) | RECTAL | Status: DC | PRN
Start: 2017-07-12 — End: 2017-07-12

## 2017-07-12 MED ORDER — FOLIC ACID 1 MG PO TABS
1.0000 mg | ORAL_TABLET | Freq: Every day | ORAL | Status: DC
  Administered 2017-07-13: 1 mg via ORAL
  Filled 2017-07-12: qty 1

## 2017-07-12 MED ORDER — DEXTROSE-NACL 5-0.9 % IV SOLN: INTRAVENOUS | Status: DC

## 2017-07-12 MED ORDER — PRAVASTATIN SODIUM 40 MG PO TABS
80.0000 mg | ORAL_TABLET | Freq: Every day | ORAL | Status: DC
  Administered 2017-07-12: 80 mg via ORAL
  Filled 2017-07-12 (×2): qty 2

## 2017-07-12 MED ORDER — ACETAMINOPHEN 500 MG PO TABS
1000.0000 mg | ORAL_TABLET | Freq: Three times a day (TID) | ORAL | Status: DC | PRN
  Administered 2017-07-12: 1000 mg via ORAL
  Filled 2017-07-12: qty 2

## 2017-07-12 MED ORDER — POLYETHYLENE GLYCOL 3350 17 G PO PACK
17.0000 g | PACK | Freq: Every day | ORAL | Status: DC
  Administered 2017-07-12 – 2017-07-13 (×2): 17 g via ORAL
  Filled 2017-07-12 (×2): qty 1

## 2017-07-12 MED ORDER — SODIUM CHLORIDE 0.9 % IV SOLN: 250.0000 mL | INTRAVENOUS | Status: DC | PRN

## 2017-07-12 MED ORDER — KETOTIFEN FUMARATE 0.025 % OP SOLN
1.0000 [drp] | Freq: Two times a day (BID) | OPHTHALMIC | Status: DC
  Administered 2017-07-12 – 2017-07-13 (×2): 1 [drp] via OPHTHALMIC
  Filled 2017-07-12: qty 5

## 2017-07-12 MED ORDER — ATROPINE SULFATE 1 % OP SOLN
1.0000 [drp] | Freq: Two times a day (BID) | OPHTHALMIC | Status: DC
  Administered 2017-07-12 – 2017-07-13 (×2): 1 [drp] via OPHTHALMIC
  Filled 2017-07-12: qty 2

## 2017-07-12 MED ORDER — INFLUENZA VAC SPLIT QUAD 0.5 ML IM SUSY
0.5000 mL | PREFILLED_SYRINGE | INTRAMUSCULAR | Status: AC
  Administered 2017-07-13: 0.5 mL via INTRAMUSCULAR
  Filled 2017-07-12: qty 0.5

## 2017-07-12 MED ORDER — LATANOPROST 0.005 % OP SOLN
1.0000 [drp] | Freq: Every day | OPHTHALMIC | Status: DC
  Administered 2017-07-12: 1 [drp] via OPHTHALMIC
  Filled 2017-07-12: qty 2.5

## 2017-07-12 MED ORDER — GLUCOSE 40 % PO GEL: 1.0000 | Freq: Once | ORAL | Status: DC | PRN

## 2017-07-12 MED ORDER — OMEPRAZOLE 20 MG PO CPDR
20.0000 mg | DELAYED_RELEASE_CAPSULE | Freq: Every day | ORAL | Status: DC
  Administered 2017-07-13: 20 mg via ORAL
  Filled 2017-07-12 (×2): qty 1

## 2017-07-12 MED ORDER — ALBUTEROL SULFATE (2.5 MG/3ML) 0.083% IN NEBU
2.5000 mg | INHALATION_SOLUTION | Freq: Once | RESPIRATORY_TRACT | Status: AC
  Administered 2017-07-12: 2.5 mg via RESPIRATORY_TRACT
  Filled 2017-07-12: qty 3

## 2017-07-12 MED ORDER — ACETAMINOPHEN 325 MG PO TABS: 650.0000 mg | ORAL_TABLET | Freq: Four times a day (QID) | ORAL | Status: DC | PRN

## 2017-07-12 MED ORDER — HEPARIN SODIUM (PORCINE) 5000 UNIT/ML IJ SOLN
5000.0000 [IU] | Freq: Three times a day (TID) | INTRAMUSCULAR | Status: DC
  Administered 2017-07-12 – 2017-07-13 (×3): 5000 [IU] via SUBCUTANEOUS
  Filled 2017-07-12 (×3): qty 1

## 2017-07-12 MED ORDER — SODIUM CHLORIDE 0.9% FLUSH
3.0000 mL | Freq: Two times a day (BID) | INTRAVENOUS | Status: DC
  Administered 2017-07-13: 3 mL via INTRAVENOUS

## 2017-07-12 MED ORDER — CLONIDINE HCL 0.2 MG/24HR TD PTWK: 0.2000 mg | MEDICATED_PATCH | TRANSDERMAL | Status: DC

## 2017-07-12 MED ORDER — VITAMIN B-1 100 MG PO TABS
100.0000 mg | ORAL_TABLET | Freq: Every day | ORAL | Status: DC
  Administered 2017-07-13: 100 mg via ORAL
  Filled 2017-07-12: qty 1

## 2017-07-12 MED ORDER — BUPROPION HCL ER (SR) 150 MG PO TB12
150.0000 mg | ORAL_TABLET | Freq: Two times a day (BID) | ORAL | Status: DC
  Administered 2017-07-12 – 2017-07-13 (×2): 150 mg via ORAL
  Filled 2017-07-12 (×3): qty 1

## 2017-07-12 MED ORDER — DEXTROSE-NACL 5-0.45 % IV SOLN
INTRAVENOUS | Status: DC
  Administered 2017-07-12: 01:00:00 via INTRAVENOUS

## 2017-07-12 MED ORDER — DEXTROSE 10 % IV SOLN: INTRAVENOUS | Status: AC

## 2017-07-12 MED ORDER — ENSURE ENLIVE PO LIQD
237.0000 mL | Freq: Two times a day (BID) | ORAL | Status: DC
  Administered 2017-07-12 – 2017-07-13 (×2): 237 mL via ORAL

## 2017-07-12 MED ORDER — SODIUM POLYSTYRENE SULFONATE 15 GM/60ML PO SUSP
30.0000 g | Freq: Once | ORAL | Status: AC
  Administered 2017-07-12: 30 g via ORAL
  Filled 2017-07-12 (×2): qty 120

## 2017-07-12 MED ORDER — TIMOLOL MALEATE 0.5 % OP SOLN
1.0000 [drp] | Freq: Every day | OPHTHALMIC | Status: DC
Start: 2017-07-12 — End: 2017-07-13
  Administered 2017-07-12 – 2017-07-13 (×2): 1 [drp] via OPHTHALMIC
  Filled 2017-07-12: qty 5

## 2017-07-12 MED ORDER — SODIUM CHLORIDE 0.9% FLUSH
3.0000 mL | Freq: Two times a day (BID) | INTRAVENOUS | Status: DC
  Administered 2017-07-12: 3 mL via INTRAVENOUS

## 2017-07-12 MED ORDER — DEXTROSE 50 % IV SOLN: 1.0000 | Freq: Once | INTRAVENOUS | Status: DC

## 2017-07-12 MED ORDER — ACETAMINOPHEN 650 MG RE SUPP: 650.0000 mg | Freq: Four times a day (QID) | RECTAL | Status: DC | PRN

## 2017-07-12 MED ORDER — SODIUM CHLORIDE 0.9% FLUSH: 3.0000 mL | INTRAVENOUS | Status: DC | PRN

## 2017-07-12 MED ORDER — DEXTROSE 50 % IV SOLN
50.0000 mL | Freq: Once | INTRAVENOUS | Status: AC
  Administered 2017-07-12: 50 mL via INTRAVENOUS
  Filled 2017-07-12: qty 50

## 2017-07-12 NOTE — ED Notes (Signed)
Attempted report 

## 2017-07-12 NOTE — ED Notes (Signed)
[place] sandwich given , patient is alert oriented.

## 2017-07-12 NOTE — ED Notes (Signed)
Patient utilized restroom via wheelchair able to stand and transfer to bed

## 2017-07-12 NOTE — ED Notes (Signed)
CBG 52; RN notified; pt given orange juice and graham crackers per RN

## 2017-07-12 NOTE — ED Notes (Signed)
EDP at bedside  

## 2017-07-12 NOTE — ED Notes (Signed)
Patient is sleeping  

## 2017-07-12 NOTE — ED Provider Notes (Signed)
Patient sign out from [employer], she came in intoxicated and hypoglycemic. She has known renal failure and recently had a fistula placed. 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  Because of difficulty keeping her glucose up she was placed on a D50 drip and was fed.  Plan is to recheck a CBG at 8:15 to ensure it is stable and not falling.  Results for orders placed or performed during the hospital encounter of 07/11/17  CBC with Differential/Platelet  Result Value Ref Range   WBC 6.2 4.0 - 10.5 K/uL   RBC 3.29 (L) 3.87 - 5.11 MIL/uL   Hemoglobin 9.4 (L) 12.0 - 15.0 g/dL   HCT 29.1 (L) 36.0 - 46.0 %   MCV 88.4 78.0 - 100.0 fL   MCH 28.6 26.0 - 34.0 pg   MCHC 32.3 30.0 - 36.0 g/dL   RDW 15.1 11.5 - 15.5 %   Platelets 270 150 - 400 K/uL   Neutrophils Relative % 40 %   Neutro Abs 2.4 1.7 - 7.7 K/uL   Lymphocytes Relative 50 %   Lymphs Abs 3.1 0.7 - 4.0 K/uL   Monocytes Relative 7 %   Monocytes Absolute 0.4 0.1 - 1.0 K/uL   Eosinophils Relative 3 %   Eosinophils Absolute 0.2 0.0 - 0.7 K/uL   Basophils Relative 0 %   Basophils Absolute 0.0 0.0 - 0.1 K/uL  Basic metabolic panel  Result Value Ref Range   Sodium 132 (L) 135 - 145 mmol/L   Potassium 4.7 3.5 - 5.1 mmol/L   Chloride 108 101 - 111 mmol/L   CO2 14 (L) 22 - 32 mmol/L   Glucose, Bld 51 (L) 65 - 99 mg/dL   BUN 46 (H) 6 - 20 mg/dL   Creatinine, Ser 4.65 (H) 0.44 - 1.00 mg/dL   Calcium 8.2 (L) 8.9 - 10.3 mg/dL   GFR calc non Af Amer 10 (L) >60 mL/min   GFR calc Af Amer 12 (L) >60 mL/min   Anion gap 10 5 - 15  Troponin I  Result Value Ref Range   Troponin I <0.03 <0.03 ng/mL  Ethanol  Result Value Ref Range   Alcohol, Ethyl (B) 109 (H) <5 mg/dL  Urine rapid drug screen (hosp performed)  Result Value Ref Range   Opiates NONE DETECTED NONE DETECTED   Cocaine NONE DETECTED NONE DETECTED   Benzodiazepines NONE DETECTED NONE DETECTED    Amphetamines NONE DETECTED NONE DETECTED   Tetrahydrocannabinol NONE DETECTED NONE DETECTED   Barbiturates NONE DETECTED NONE DETECTED  CBG monitoring, ED  Result Value Ref Range   Glucose-Capillary 80 65 - 99 mg/dL  CBG monitoring, ED  Result Value Ref Range   Glucose-Capillary 30 (LL) 65 - 99 mg/dL   Comment 1 Document in Chart   CBG monitoring, ED  Result Value Ref Range   Glucose-Capillary 182 (H) 65 - 99 mg/dL  CBG monitoring, ED  Result Value Ref Range   Glucose-Capillary 145 (H) 65 - 99 mg/dL  CBG monitoring, ED  Result Value Ref Range   Glucose-Capillary 184 (H) 65 - 99 mg/dL  CBG monitoring, ED  Result Value Ref Range   Glucose-Capillary 112 (H) 65 - 99 mg/dL  CBG monitoring, ED  Result Value Ref Range   Glucose-Capillary 93 65 - 99 mg/dL  CBG monitoring, ED  Result Value Ref Range   Glucose-Capillary 75 65 - 99 mg/dL  CBG monitoring,  ED  Result Value Ref Range   Glucose-Capillary 52 (L) 65 - 99 mg/dL   Dg Elbow 2 Views Left  Result Date: 07/12/2017 CLINICAL DATA:  Syncope and fall. Left elbow pain for couple of hours. EXAM: LEFT ELBOW - 2 VIEW COMPARISON:  None. FINDINGS: Surgical clips and skin clips along the ulnar aspect of the left elbow. Soft tissue swelling in the antecubital region. No evidence of acute fracture or dislocation of the left elbow. No significant effusion. IMPRESSION: No acute bony abnormalities. Surgical clips and soft tissue swelling. Electronically Signed   By: Lucienne Capers M.D.   On: 07/12/2017 05:18   Dg Forearm Left  Result Date: 07/12/2017 CLINICAL DATA:  Left forearm pain after a fall. EXAM: LEFT FOREARM - 2 VIEW COMPARISON:  None. FINDINGS: Skin clips and surgical clips over the elbow. Soft tissue swelling over the ulnar aspect of the elbow. Radius and ulna appear intact. No evidence of acute fracture or dislocation. No focal bone lesions. IMPRESSION: No acute bony abnormalities. Electronically Signed   By: Lucienne Capers M.D.   On:  07/12/2017 05:18   Ct Head Wo Contrast  Result Date: 07/12/2017 CLINICAL DATA:  Altered mental status EXAM: CT HEAD WITHOUT CONTRAST CT CERVICAL SPINE WITHOUT CONTRAST TECHNIQUE: Multidetector CT imaging of the head and cervical spine was performed following the standard protocol without intravenous contrast. Multiplanar CT image reconstructions of the cervical spine were also generated. COMPARISON:  CT head and cervical spine 06/08/2016 FINDINGS: CT HEAD FINDINGS Brain: No mass lesion, intraparenchymal hemorrhage or extra-axial collection. No evidence of acute cortical infarct. There is periventricular hypoattenuation compatible with chronic microvascular disease. Vascular: No hyperdense vessel or unexpected calcification. Skull: Normal visualized skull base, calvarium and extracranial soft tissues. Sinuses/Orbits: No sinus fluid levels or advanced mucosal thickening. No mastoid effusion. Bilateral glaucoma treatment devices. CT CERVICAL SPINE FINDINGS Alignment: No static subluxation. Facets are aligned. Occipital condyles are normally positioned. Skull base and vertebrae: No acute fracture. Soft tissues and spinal canal: No prevertebral fluid or swelling. No visible canal hematoma. Disc levels: Multilevel degenerative disc disease with disc osteophyte complexes at C4- C7. No bony spinal canal stenosis. Upper chest: No pneumothorax, pulmonary nodule or pleural effusion. Other: Normal visualized paraspinal cervical soft tissues. IMPRESSION: 1. No acute intracranial abnormality. 2. No acute fracture or static subluxation of cervical spine. Electronically Signed   By: Ulyses Jarred M.D.   On: 07/12/2017 01:01   Ct Cervical Spine Wo Contrast  Result Date: 07/12/2017 CLINICAL DATA:  Altered mental status EXAM: CT HEAD WITHOUT CONTRAST CT CERVICAL SPINE WITHOUT CONTRAST TECHNIQUE: Multidetector CT imaging of the head and cervical spine was performed following the standard protocol without intravenous contrast.  Multiplanar CT image reconstructions of the cervical spine were also generated. COMPARISON:  CT head and cervical spine 06/08/2016 FINDINGS: CT HEAD FINDINGS Brain: No mass lesion, intraparenchymal hemorrhage or extra-axial collection. No evidence of acute cortical infarct. There is periventricular hypoattenuation compatible with chronic microvascular disease. Vascular: No hyperdense vessel or unexpected calcification. Skull: Normal visualized skull base, calvarium and extracranial soft tissues. Sinuses/Orbits: No sinus fluid levels or advanced mucosal thickening. No mastoid effusion. Bilateral glaucoma treatment devices. CT CERVICAL SPINE FINDINGS Alignment: No static subluxation. Facets are aligned. Occipital condyles are normally positioned. Skull base and vertebrae: No acute fracture. Soft tissues and spinal canal: No prevertebral fluid or swelling. No visible canal hematoma. Disc levels: Multilevel degenerative disc disease with disc osteophyte complexes at C4- C7. No bony spinal canal stenosis. Upper chest:  No pneumothorax, pulmonary nodule or pleural effusion. Other: Normal visualized paraspinal cervical soft tissues. IMPRESSION: 1. No acute intracranial abnormality. 2. No acute fracture or static subluxation of cervical spine. Electronically Signed   By: Ulyses Jarred M.D.   On: 07/12/2017 01:01   Dg Humerus Left  Result Date: 07/12/2017 CLINICAL DATA:  Fall with pain in the humerus EXAM: LEFT HUMERUS - 2+ VIEW COMPARISON:  None. FINDINGS: No fracture or malalignment. Soft tissue staples at the elbow. No radiopaque foreign body in the soft tissues elsewhere. IMPRESSION: No acute osseous abnormality Electronically Signed   By: Donavan Foil M.D.   On: 07/12/2017 01:19    REPEAT CBG is 53. Patient put up for unassigned admission. Hospitalist service has agreed to admit.        Delos Haring, PA-C 07/12/17 [id number]    Ezequiel Essex, MD 07/12/17 [phone]

## 2017-07-12 NOTE — ED Notes (Signed)
Patient taken to CT/XRAY

## 2017-07-12 NOTE — H&P (Signed)
Date: 07/12/2017               Patient Name:  Tammy Moses MRN: [id number]  DOB: 10/20/1966 Age / Sex: 51 y.o., female   PCP: Tammy Jun, FNP         Medical Service: Internal Medicine Teaching Service         Attending Physician: Dr. Sid Falcon, MD    First Contact: Dr. Berline Moses Pager: [phone]  Second Contact: Dr. Reesa Moses Pager: [phone]       After Hours (After 5p/  First Contact Pager: [phone]  weekends / holidays): Second Contact Pager: [phone]   Chief Complaint: Fall, low blood sugars  History of Present Illness:  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 She reports to me that she was having drinks with a friend last night and she was sitting down at the bus stop. When she stood up she reports losing consciousness and does not remember anything until she woke up in the ambulance. She denies any pre-syncopal symptoms. Denies any post-ictal symptoms, no tongue biting, no bowel or bladder incontinence. Reports only drinking two beers last night. Ethanol level in the ED was 109 and patient appeared to be intoxicated per EDP. States she drinks 2-3 beers weekly. Denies any current illicit drug use. She reports to me that she had an episode of dizziness/lightheadedness/confusion upon standing Saturday evening as well without loss of consciousness. Upon arrival to the ED her CBGs were noted to be low in the 30s. This improved with D50, however have continued to drop. She reports taking Lantus 50 units qhs however reports she did not take it last night. Does tell me that this is a new medication for her in the past 2-3 months. Reports good PO intake  without any decreased appetite. No nausea, vomiting, diarrhea. Does report some dysuria three weeks ago that has cleared but does report persistent urinary frequency and urgency.   In the ED, initial altered mental status was thought to be secondary to alcohol intoxication though CT head and neck was ordered which was negative. Mental status improved over night and cleared. Her hypoglycemia was initially treated with D50 by EMS. CBGs continued to drop in the ED necessitating a D5 1/2 NS infusion. Blood sugars continued to drop following discontinuation of the D5 infusion and IMTS was called for admission.   Meds:  No outpatient prescriptions have been marked as taking for the 07/11/17 encounter [facility] Encounter).   Allergies: Allergies as of 07/11/2017  . (No Known Allergies)   Past Medical History:  Diagnosis Date  . Anemia   . Anxiety   . Blind right eye   . Constipation, chronic   . Dental caries   . Diabetes mellitus   . Diabetic neuropathy ([place])   . Diabetic retinopathy   . GERD (gastroesophageal reflux disease)   . Glaucoma   . H. pylori infection   . Hepatitis C carrier ([place])   . High risk sexual behavior   . Hyperlipidemia   . Hypertension   . Insomnia   . Microalbuminuria   . Nonspecific elevation of levels of transaminase or lactic acid dehydrogenase (LDH)   . Tobacco  dependence   . Vitamin D deficiency    Past Surgical History:  Procedure Laterality Date  . ABDOMINAL HYSTERECTOMY  04/30/2009   PARTIAL  . COLONOSCOPY    . ESOPHAGOGASTRODUODENOSCOPY     Family History:  Family History  Problem Relation Age of Onset  . High blood pressure Mother   . Diabetes Mother   . Thyroid disease Mother   . Diabetes Father   . High blood pressure Father    Social History:  Social History   Social History  . Marital status: Married    Spouse name: N/A  . Number of children: 2  . Years of education: 11   Occupational History  .  Disabled    Disabled   Social  History Main Topics  . Smoking status: Current Every Day Smoker    Packs/day: 0.25    Types: Cigarettes  . Smokeless tobacco: Never Used     Comment: trying to quit  . Alcohol use Yes     Comment: 2-3 beers weekly  . Drug use: No     Comment: quit 3 months ago  . Sexual activity: Not Asked   Other Topics Concern  . None   Social History Narrative   Patient lives at home with he boyfriend.    Disabled.   Right handed.   Education 11 th grade.   Caffeine three cups of coffee daily.   Review of Systems: A complete ROS was negative except as per HPI.   Physical Exam: Blood pressure (!) 145/80, pulse 86, temperature 98.7 F (37.1 C), temperature source Oral, resp. rate 12, height 5\' 2"  (1.575 m), weight 138 lb 14.2 oz (63 kg), SpO2 98 %. General: alert, well-developed, and cooperative to examination.  Head: normocephalic and atraumatic.  Eyes: blind in left eye with changes consistent with glaucoma, right eye with reactive pupils, EOMI Mouth: pharynx pink and moist, no erythema, and no exudates.  Neck: supple, full ROM, no thyromegaly, no JVD, and no carotid bruits.  Lungs: normal respiratory effort, no accessory muscle use, normal breath sounds Heart: normal rate, regular rhythm,  2/6 systolic murmur, no gallop, and no rub.  Abdomen: soft, non-tender, normal bowel sounds, no distention, no guarding Msk: no joint swelling, no joint warmth, and no redness over joints.  Pulses: 2+ DP/PT pulses bilaterally. LUE with stapled surgical site to proximal AC area from L arm AVF. Palpable thrill present.  Extremities: No cyanosis, clubbing, edema Neurologic: alert & oriented X3, no focal deficits Skin: turgor normal and no rashes.  Psych: normal mood and affect  EKG: personally reviewed my interpretation is NSR  Assessment & Plan by Problem:  Hypoglycemia in insulin dependent type 2 diabetic:  [name] presents following a fall with possible syncopal episode. CBGs in the 86s by EMS.  Improved with treatment, however CBGs have been persistently low into the 50s when D5 infusions has been discontinued. Reports taking Lantus 50 units qhs, glimepiride 4 mg bid, and saxagliptin 2.5 mg daily at home. Reports taking her oral medications yesterday but did not take her Lantus per report. Tells me that her Lantus is new in the past 2-3 months. It appears that she was on Novolin 75/25 55 units bid previously and converted to Lantus in May. It also appears her glimepiride was discontinued at that time but still appears on her medication list. Her last A1c on 05/10/17 was 6.6 though may be falsely low due to her CKD stage V. She was also intoxicated on arrival.  Reports only drinking last night and denies several day alcohol binge. She reports good appetite and good oral intake. Denies any fevers, chills, nausea, vomiting, diarrhea. Does report some urinary frequency and urgency. Weights appear stable since May from outpatient records.  Her persistent hypoglycemia is likely iatrogenic due to her medications though if she has been drinking alcohol for several days this may be playing a role as well. She likely does not need 3 medications her her diabetes or such a high insulin requirement. Will hold her home medications, start D10 infusion x6 hours, monitor CBGs q4hr, and feed patient. Will check UA given urinary symptoms.    Syncopal episode:  Patient reports loss of consciousness last night upon standing. No mention of this by EMS or ED report. She reports symptoms upon standing and reports dizziness and lightheadedness 2 days ago with standing. Continues to be lightheaded when sitting up it the ED. Denies any prodromal symptoms. Could be related to her hypoglycemia as her CBG was 36 when EMS arrived. However, given her symptoms with standing and being on 4 antihypertensives and Lasix, will check orthostatics and place on telemetry. Holding home antihypertensives except for clonidine to avoid rebound HTN.    CKD stage V: Followed by nephrology outpatient. Thought to be secondary to DM and HTN with nephrotic range proteinuria. AVF placed 8/31. Not currently on dialysis. Takes lasix 20 mg daily outpatient. Holding with concern for possible orthostatic hypotension.   Hypertension: It is difficult to tell what she is actually taking outpatient. PCP and Nephrology medicaiton lists do not match up with medications. She has listed amlodipine 10 mg daily (not on PCP med rec), Hydralazine 100 mg bid, Clonidine 0.2 mg patch weekly, Labetalol 400 mg bid, Lasix 20 mg daily. Metoprolol 25 mg bid also listed on PCP HTN management. SBP has been 120-140s here. Will hold all except home clonidine to avoid rebound hypertension. Will need to clarify her home medications prior to discharge.   History of Hepatitis C: Genotype 1b treated with Harvoni by Dr. Linus Salmons  Normocytic anemia: Likely anemia of chronic disease secondary to her CKD. Checking iron studies.   History of polysubstance abuse:  Reports history of marijuana and cocaine abuse. Reports quitting 3 months ago. UDS negative.   Alcohol intoxication:  ETOH level 109 on arrival last night. Mentation is back to normal in the ED this morning. Will give thiamine and folate.   Left eye glaucoma: Continue home medications  Tobacco Abuse:  Trying to quit. Previously 1 PPD. Down to 6 cigarettes a day. Denies any cravings currently. She reports improvement since starting Wellbutrin outpatient.   Code: Full  Dispo: Admit patient to Observation with expected length of stay less than 2 midnights.  Signed: Maryellen Pile, MD 07/12/2017, 10:27 AM

## 2017-07-12 NOTE — ED Notes (Signed)
Breakfast Tray ordered,.

## 2017-07-12 NOTE — ED Notes (Signed)
Pt ambulated in the hall with her rolling walker. Patient tolerated well.

## 2017-07-12 NOTE — ED Notes (Signed)
Iv team at bedside / report called

## 2017-07-13 DIAGNOSIS — E1139 Type 2 diabetes mellitus with other diabetic ophthalmic complication: Secondary | ICD-10-CM | POA: Diagnosis not present

## 2017-07-13 DIAGNOSIS — R55 Syncope and collapse: Secondary | ICD-10-CM | POA: Diagnosis not present

## 2017-07-13 DIAGNOSIS — F10129 Alcohol abuse with intoxication, unspecified: Secondary | ICD-10-CM | POA: Diagnosis not present

## 2017-07-13 DIAGNOSIS — E11649 Type 2 diabetes mellitus with hypoglycemia without coma: Secondary | ICD-10-CM | POA: Diagnosis not present

## 2017-07-13 DIAGNOSIS — Z9181 History of falling: Secondary | ICD-10-CM | POA: Diagnosis not present

## 2017-07-13 DIAGNOSIS — I120 Hypertensive chronic kidney disease with stage 5 chronic kidney disease or end stage renal disease: Secondary | ICD-10-CM | POA: Diagnosis not present

## 2017-07-13 DIAGNOSIS — H409 Unspecified glaucoma: Secondary | ICD-10-CM | POA: Diagnosis not present

## 2017-07-13 DIAGNOSIS — K219 Gastro-esophageal reflux disease without esophagitis: Secondary | ICD-10-CM | POA: Diagnosis not present

## 2017-07-13 DIAGNOSIS — E1122 Type 2 diabetes mellitus with diabetic chronic kidney disease: Secondary | ICD-10-CM | POA: Diagnosis not present

## 2017-07-13 DIAGNOSIS — H42 Glaucoma in diseases classified elsewhere: Secondary | ICD-10-CM | POA: Diagnosis not present

## 2017-07-13 DIAGNOSIS — R269 Unspecified abnormalities of gait and mobility: Secondary | ICD-10-CM

## 2017-07-13 DIAGNOSIS — W19XXXA Unspecified fall, initial encounter: Secondary | ICD-10-CM

## 2017-07-13 DIAGNOSIS — N185 Chronic kidney disease, stage 5: Secondary | ICD-10-CM | POA: Diagnosis not present

## 2017-07-13 LAB — COMPREHENSIVE METABOLIC PANEL
ALT: 16 U/L (ref 14–54)
AST: 27 U/L (ref 15–41)
Albumin: 3.5 g/dL (ref 3.5–5.0)
Alkaline Phosphatase: 73 U/L (ref 38–126)
Anion gap: 9 (ref 5–15)
BILIRUBIN TOTAL: 0.2 mg/dL — AB (ref 0.3–1.2)
BUN: 44 mg/dL — AB (ref 6–20)
CO2: 18 mmol/L — ABNORMAL LOW (ref 22–32)
Calcium: 8.5 mg/dL — ABNORMAL LOW (ref 8.9–10.3)
Chloride: 109 mmol/L (ref 101–111)
Creatinine, Ser: 4.78 mg/dL — ABNORMAL HIGH (ref 0.44–1.00)
GFR calc Af Amer: 11 mL/min — ABNORMAL LOW (ref >60)
GFR, EST NON AFRICAN AMERICAN: 10 mL/min — AB (ref >60)
Glucose, Bld: 212 mg/dL — ABNORMAL HIGH (ref 65–99)
POTASSIUM: 5 mmol/L (ref 3.5–5.1)
Sodium: 136 mmol/L (ref 135–145)
TOTAL PROTEIN: 6.9 g/dL (ref 6.5–8.1)

## 2017-07-13 LAB — POTASSIUM: Potassium: 5 mmol/L (ref 3.5–5.1)

## 2017-07-13 LAB — GLUCOSE, CAPILLARY
GLUCOSE-CAPILLARY: 115 mg/dL — AB (ref 65–99)
GLUCOSE-CAPILLARY: 211 mg/dL — AB (ref 65–99)
GLUCOSE-CAPILLARY: 147 mg/dL — AB (ref 65–99)
Glucose-Capillary: 212 mg/dL — ABNORMAL HIGH (ref 65–99)
Glucose-Capillary: 217 mg/dL — ABNORMAL HIGH (ref 65–99)

## 2017-07-13 MED ORDER — HYDRALAZINE HCL 50 MG PO TABS
50.0000 mg | ORAL_TABLET | Freq: Once | ORAL | Status: AC
  Administered 2017-07-13: 50 mg via ORAL
  Filled 2017-07-13: qty 1

## 2017-07-13 MED ORDER — LABETALOL HCL 200 MG PO TABS
200.0000 mg | ORAL_TABLET | Freq: Once | ORAL | Status: AC
  Administered 2017-07-13: 200 mg via ORAL
  Filled 2017-07-13: qty 1

## 2017-07-13 MED ORDER — GLUCERNA SHAKE PO LIQD
237.0000 mL | Freq: Three times a day (TID) | ORAL | Status: DC
  Administered 2017-07-13: 237 mL via ORAL

## 2017-07-13 MED ORDER — INSULIN GLARGINE 100 UNIT/ML SOLOSTAR PEN: 40.0000 [IU] | PEN_INJECTOR | Freq: Every day | SUBCUTANEOUS | 99 refills | Status: DC

## 2017-07-13 NOTE — Evaluation (Addendum)
Occupational Therapy Evaluation Patient Details Name: Tammy Moses MRN: [id number] DOB: December 19, 1965 Today's Date: 07/13/2017    History of Present Illness 51 yo female with onset of hypoglycemia was noted to pass out at bus stop, admitted for medical management of her K+ and anemia.  Has been cleared for DC with pt noting she has a caregiver 4 hours a day.  Pt lost her R foot brace and has pre-existing foot drop.  PMHx:  glaucoma with R eye blindness, CKD 5, new shunt, DM, HTN, CIWA   Clinical Impression   Pt at baseline level of function with LB ADLs. Pt with decreased balance and reports pain in B LEs. Pt will resume with caregiver 7 days/wk for ADL and ADL sup/assist and safety per pt and have adequate assist from family. All education completed and no further acute OT indicated at this time. Pt to continue with acute PT for functional mobility safety     Follow Up Recommendations  Home Health OT    Equipment Recommendations  Toilet rise with handles;Tub/shower bench;Other (comment) (ADL A/E kit)    Recommendations for Other Services       Precautions / Restrictions Precautions Precautions: Fall Required Braces or Orthoses: Other Brace/Splint Restrictions Weight Bearing Restrictions: No      Mobility Bed Mobility Overal bed mobility: Needs Assistance Bed Mobility: Supine to Sit     Supine to sit: Min assist     General bed mobility comments: pt up in recliner upon arrival  Transfers Overall transfer level: Needs assistance Equipment used: 1 person hand held assist;4-wheeled walker Transfers: Sit to/from Stand Sit to Stand: Min guard         General transfer comment: needs extra time    Balance Overall balance assessment: Needs assistance;History of Falls Sitting-balance support: Feet supported Sitting balance-Leahy Scale: Fair     Standing balance support: Bilateral upper extremity supported;During functional activity;Single extremity supported Standing  balance-Leahy Scale: Poor                             ADL either performed or assessed with clinical judgement   ADL Overall ADL's : Needs assistance/impaired     Grooming: Wash/dry hands;Wash/dry face;Standing;Min guard   Upper Body Bathing: Set up;Sitting   Lower Body Bathing: Minimal assistance;Min guard;Sit to/from stand   Upper Body Dressing : Set up;Sitting   Lower Body Dressing: Minimal assistance;Min guard;Sit to/from stand       Toileting- Water quality scientist and Hygiene: Min guard;Sit to/from stand       Functional mobility during ADLs: Minimal assistance;Min guard General ADL Comments: At [facility], pt has assist 3 hrs/day with mobility and ADLs sup/safety     Vision Baseline Vision/History: Glaucoma (blind in R eye) Patient Visual Report: No change from baseline       Perception     Praxis      Pertinent Vitals/Pain Pain Assessment: 0-10 Pain Score: 7  Pain Location: B hands and LEs Pain Descriptors / Indicators: Aching;Sore Pain Intervention(s): Monitored during session;Repositioned     Hand Dominance Right   Extremity/Trunk Assessment Upper Extremity Assessment Upper Extremity Assessment: Overall WFL for tasks assessed (pain in B shoulder at 90 degrees active/passive flexion)   Lower Extremity Assessment Lower Extremity Assessment: Defer to PT evaluation RLE Deficits / Details: R foot drop and rest of RLE is 4- to 4 strength RLE Coordination: decreased gross motor   Cervical / Trunk Assessment Cervical / Trunk Assessment:  Normal   Communication Communication Communication: No difficulties   Cognition Arousal/Alertness: Awake/alert Behavior During Therapy: WFL for tasks assessed/performed Overall Cognitive Status: Within Functional Limits for tasks assessed                                     General Comments  Pt very pleasant and cooperative    Exercises    Shoulder Instructions      Home Living  Family/patient expects to be discharged to:: Private residence Living Arrangements: Alone Available Help at Discharge: Personal care attendant;Available PRN/intermittently Type of Home: Apartment Home Access: Level entry     Home Layout: One level     Bathroom Shower/Tub: Teacher, early years/pre: Standard     Home Equipment: Environmental consultant - 4 wheels          Prior Functioning/Environment Level of Independence: Needs assistance  Gait / Transfers Assistance Needed: rollator for short trips ADL's / Homemaking Assistance Needed: has caregiver to assist her mobility, sup with showers and set up of toiletries and clothing items. Min A intermittently with LB ADLs            OT Problem List: Impaired balance (sitting and/or standing);Pain      OT Treatment/Interventions:      OT Goals(Current goals can be found in the care plan section) Acute Rehab OT Goals Patient Stated Goal: go home OT Goal Formulation: All assessment and education complete, DC therapy  OT Frequency:     Barriers to D/C:    pt states that she will have adequate assist from family and paid caregiver 7 days/wk       Co-evaluation              AM-PAC PT "6 Clicks" Daily Activity     Outcome Measure Help from another person eating meals?: None Help from another person taking care of personal grooming?: A Little Help from another person toileting, which includes using toliet, bedpan, or urinal?: A Little Help from another person bathing (including washing, rinsing, drying)?: A Little Help from another person to put on and taking off regular upper body clothing?: None Help from another person to put on and taking off regular lower body clothing?: A Little 6 Click Score: 20   End of Session Equipment Utilized During Treatment: Rolling walker  Activity Tolerance: Patient tolerated treatment well Patient left: in chair;with call bell/phone within reach  OT Visit Diagnosis: Unsteadiness on feet  (R26.81);Pain                Time: 9147-8295 OT Time Calculation (min): 21 min Charges:  OT General Charges $OT Visit: 1 Visit OT Evaluation $OT Eval Moderate Complexity: 1 Mod G-Codes: OT G-codes **NOT FOR INPATIENT CLASS** Functional Assessment Tool Used: AM-PAC 6 Clicks Daily Activity Functional Limitation: Self care Self Care Current Status (A2130): At least 20 percent but less than 40 percent impaired, limited or restricted Self Care Goal Status (Q6578): At least 20 percent but less than 40 percent impaired, limited or restricted Self Care Discharge Status [phone]): At least 20 percent but less than 40 percent impaired, limited or restricted     Britt Bottom 07/13/2017, 2:55 PM
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
## 2017-07-13 NOTE — Progress Notes (Signed)
   07/13/17 1448  Vitals  BP (!) 180/86  BP Location Right Arm  BP Method Manual  Patient Position (if appropriate) Sitting  Pulse Rate 90  Pulse Rate Source Dinam[employer]  provider notified and will place order

## 2017-07-13 NOTE — Discharge Summary (Signed)
Name: Tammy Moses MRN: [id number] DOB: 29-May-1966 51 y.o. PCP: Scot Jun, FNP  Date of Admission: 07/11/2017 10:11 PM Date of Discharge: 07/15/2017 Attending Physician: Gilles Chiquito, MD  Discharge Diagnosis: Principal Problem:   Hypoglycemia Active Problems:   BLINDNESS, RIGHT EYE   Essential hypertension   GERD   Gait abnormality   Abnormality of gait   Diabetes mellitus ([place])   CKD (chronic kidney disease) stage 5, GFR less than 15 ml/min (HCC)   Alcoholic intoxication without complication ([place])   Fall  Discharge Medications: Allergies as of 07/13/2017   No Known Allergies     Medication List    STOP taking these medications   glimepiride 4 MG tablet Commonly known as:  AMARYL   metoprolol tartrate 25 MG tablet Commonly known as:  LOPRESSOR     TAKE these medications   albuterol 108 (90 Base) MCG/ACT inhaler Commonly known as:  PROVENTIL HFA;VENTOLIN HFA Inhale 2 puffs into the lungs every 4 (four) hours as needed for shortness of breath. For shortness of breath   atropine 1 % ophthalmic solution Place 1 drop into both eyes 2 (two) times daily.   buPROPion 150 MG 12 hr tablet Commonly known as:  WELLBUTRIN SR Take 1 tablet (150 mg total) by mouth 2 (two) times daily.   cloNIDine 0.2 mg/24hr patch Commonly known as:  CATAPRES - Dosed in mg/24 hr Place 1 patch (0.2 mg total) onto the skin once a week.   furosemide 20 MG tablet Commonly known as:  LASIX Take 20 mg by mouth daily.   Gabapentin (Once-Daily) 600 MG Tabs Take 600 mg by mouth 3 (three) times daily.   glucose blood test strip Commonly known as:  ONE TOUCH ULTRA TEST Use as instructed   hydrALAZINE 100 MG tablet Commonly known as:  APRESOLINE Take 1 tablet (100 mg total) by mouth 2 (two) times daily.   Insulin Glargine 100 UNIT/ML Solostar Pen Commonly known as:  LANTUS SOLOSTAR Inject 40 Units into the skin daily at 10 pm. What changed:  how much to take   Insulin Pen  Needle 29G X 12MM Misc E11.9 Dx Code Use once at bedtime with each nightly insulin   ketotifen 0.025 % ophthalmic solution Commonly known as:  ZADITOR Place 1 drop into both eyes 2 (two) times daily.   labetalol 200 MG tablet Commonly known as:  NORMODYNE Take 400 mg by mouth daily.   omeprazole 20 MG capsule Commonly known as:  PRILOSEC Take 1 capsule (20 mg total) by mouth daily. Has to be taken at the same time with Harvoni FASTED   onetouch ultrasoft lancets Use as instructed   pravastatin 80 MG tablet Commonly known as:  PRAVACHOL Take 1 tablet (80 mg total) by mouth daily.   saxagliptin HCl 2.5 MG Tabs tablet Commonly known as:  ONGLYZA Take 1 tablet (2.5 mg total) by mouth daily.   timolol 0.5 % ophthalmic solution Commonly known as:  TIMOPTIC Place 1 drop into the left eye daily. (100/1=100)   Travoprost (BAK Free) 0.004 % Soln ophthalmic solution Commonly known as:  TRAVATAN Place 1 drop into both eyes at bedtime.   trimethoprim-polymyxin b ophthalmic solution Commonly known as:  POLYTRIM Place 1 drop into both eyes every 4 (four) hours.   Vitamin D-3 1000 units Caps Take 2,000 Units by mouth daily.            Durable Medical Equipment        Start  Ordered   07/13/17 0000  For home use only DME Shower stool     07/13/17 1256       Discharge Care Instructions        Start     Ordered   07/13/17 0000  Insulin Glargine (LANTUS SOLOSTAR) 100 UNIT/ML Solostar Pen  Daily at 10 pm     07/13/17 1132   07/13/17 0000  Increase activity slowly     07/13/17 1132   07/13/17 0000  Diet - low sodium heart healthy     07/13/17 1132   07/13/17 0000  Call MD for:  persistant dizziness or light-headedness     07/13/17 1132   07/13/17 0000  Call MD for:  redness, tenderness, or signs of infection (pain, swelling, redness, odor or green/yellow discharge around incision site)     07/13/17 1132   07/13/17 0000  For home use only DME Shower stool      07/13/17 1256   07/13/17 [place]  (Home health needs / face to face )    Question Answer Comment  To provide the following care/treatments PT   To provide the following care/treatments OT   To provide the following care/treatments [place]   To provide the following care/treatments Social work      07/13/17 1259   07/13/17 0000  Face-to-face encounter (required for Medicare/Medicaid patients)  (Home health needs / face to face )    Comments:  I Kathi Ludwig certify that this patient is under my care and that I, or a nurse practitioner or physician's assistant working with me, had a face-to-face encounter that meets the physician face-to-face encounter requirements with this patient on 07/13/2017. The encounter with the patient was in whole, or in part for the following medical condition(s) which is the primary reason for home health care (List medical condition): Gait abnormality, right eye blindness,  Question Answer Comment  The encounter with the patient was in whole, or in part, for the following medical condition, which is the primary reason for home health care Glaucoma, blindness, gait abnormality,   I certify that, based on my findings, the following services are medically necessary home health services Physical therapy   Reason for Medically [place] Therapy- Therapeutic Exercises to Increase Strength and Endurance   My clinical findings support the need for the above services Unsafe ambulation due to balance issues   Further, I certify that my clinical findings support that this patient is homebound due to: Unsafe ambulation due to balance issues      07/13/17 1259      Disposition and follow-up:   Ms.Naja R Boschee was discharged from [facility] in Stable condition.  At the hospital follow up visit please address:  1.  A. Please follow-up with the patients home health needs      B. Please follow-up with the her  diabetes medications, decreased Lantus due to hypoglycemia      C. Please assist the patient with gait abnormality      D. Please discuss with the patient her ability to medicate herself nightly given her poor vision.  2.  Labs / imaging needed at time of follow-up: CBG  3.  Pending labs/ test needing follow-up: n/a  Follow-up Appointments: Follow-up Information    Scot Jun, FNP Follow up.   Specialty:  Family Medicine Contact information: [address] [place] [address] [phone]  [facility], [facility] Follow up.   Why:  home health service arranged, office will call and set up home visits Contact information: [place] [address] [phone]          Hospital Course by problem list: Principal Problem:   Hypoglycemia Active Problems:   BLINDNESS, RIGHT EYE   Essential hypertension   GERD   Gait abnormality   Abnormality of gait   Diabetes mellitus (HCC)   CKD (chronic kidney disease) stage 5, GFR less than 15 ml/min (HCC)   Alcoholic intoxication without complication ([place])   Fall   1. Hypoglycemia, Diabetes mellitus: Patient presented after syncopal event which was most likely secondary to hypoglycemia. Patient was noted to have a blood glucose of 36 by EMS which resolved to 1.3 after D10 administration. He was 80 point arrival to the ED that time she was given additional D50 which improved her hyperglycemia to 120, however,  It again dropped down to 34, requiring continuous D5W. The patient remained somnolent throughout. 2 hours and to continue his teeth 5W, IV infiltrated IV access was unobtainable by the IV team. At this point her blood glucose stabilized and remained elevated about 82 now requiring additional IV glucose. Dextrose gel was prescribed when necessary for hypoglycemia given the lack of IV access. All hypoglycemic medications were held during admission. The patient's hypoglycemia resolved during her  stay she can became hyperglycemic. At this time it was determined that this was most likely caused by her multiple PO diabetes medications. Patient's glimepiride was discontinued upon discharge and fever of Lantus 40 units daily at bedtime, and saxagliptin. Given the patients poor vision, TID insulin aspart was not advised as she has difficulty drawing the medication for injection.  2. Syncope with fall: The patient attested to a syncopal event most limited secondary to hypoglycemia as above. No additional events occurred during her stay, she denied presyncopal prodrome, shaking, seizure activity, loss of bowel or bladder control, palpitations, chest pain prior, her other sign indicating neuro or cardiac origin of the syncope.  3. 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 Patient multiple medications for glaucoma on the left eye and follows with ophthalmology. She was advised to continue following with ophthalmology as an outpatient.  4. Alcohol intoxication: Patient presented with a blood alcohol level of 109 the setting of clinical consumed only 2 beers. Patient was advised to discontinue alcohol use.  5. CKD stage 5: The patient is followed by nephrology as an outpatient. AV fistula was placed on 8/31 however, patient is not currently on dialysis. Patient's furosemide 20 mg daily was held initially due to hypotension, but later a minister. Patient was discharged home on furosemide 20 mg.  6. Hypertension: On admission the patient was noted to be hypotensive, as such, her antihypertensives were held. Her stay however her blood pressure typically increased to 180 over upper 80s requiring a dose of hydralazine 50, labetalol 100, prior to discharge. She was discharged home on her previous home antihypertensive medication regimen.    7. Gait abnormality: Patient has marked difficulty  ambulating at baseline, requiring a walker. She often falls without syncope secondary to mechanical intervention. In conjunction with blindness in her right eye and decreased vision on the left, home health was ordered to assist patient with her medication administration and home health needs.  Discharge Vitals:   BP (!) 186/88 (BP  Location: Right Arm)   Pulse 98   Temp 98.5 F (36.9 C) (Oral)   Resp 16   Ht 5\' 1"  (1.549 m)   Wt 149 lb (67.6 kg)   SpO2 100%   BMI 28.15 kg/m   Pertinent Labs, Studies, and Procedures:  CBC CBC Latest Ref Rng & Units 07/11/2017 06/09/2017 05/10/2017  WBC 4.0 - 10.5 K/uL 6.2 8.6 9.3  Hemoglobin 12.0 - 15.0 g/dL 9.4(L) 11.2(L) 11.7  Hematocrit 36.0 - 46.0 % 29.1(L) 33.9(L) 38.6  Platelets 150 - 400 K/uL 270 260 242   CMP Latest Ref Rng & Units 07/13/2017 07/12/2017 07/12/2017  Glucose 65 - 99 mg/dL 212(H) - -  BUN 6 - 20 mg/dL 44(H) - -  Creatinine 0.44 - 1.00 mg/dL 4.78(H) - -  Sodium 135 - 145 mmol/L 136 - -  Potassium 3.5 - 5.1 mmol/L 5.0 5.0 5.6(H)  Chloride 101 - 111 mmol/L 109 - -  CO2 22 - 32 mmol/L 18(L) - -  Calcium 8.9 - 10.3 mg/dL 8.5(L) - -  Total Protein 6.5 - 8.1 g/dL 6.9 - -  Total Bilirubin 0.3 - 1.2 mg/dL 0.2(L) - -  Alkaline Phos 38 - 126 U/L 73 - -  AST 15 - 41 U/L 27 - -  ALT 14 - 54 U/L 16 - -   CT Head WO contrast: IMPRESSION: 1. No acute intracranial abnormality. 2. No acute fracture or static subluxation of cervical spine.  Hgb A1c: Last one completed on 05/10/2017 at 6.6  Discharge Instructions: Discharge Instructions    Call MD for:  persistant dizziness or light-headedness    Complete by:  As directed    Call MD for:  redness, tenderness, or signs of infection (pain, swelling, redness, odor or green/yellow discharge around incision site)    Complete by:  As directed    Diet - low sodium heart healthy    Complete by:  As directed    Face-to-face encounter (required for Medicare/Medicaid patients)    Complete by:  As  directed    I Kathi Ludwig certify that this patient is under my care and that I, or a nurse practitioner or physician's assistant working with me, had a face-to-face encounter that meets the physician face-to-face encounter requirements with this patient on 07/13/2017. The encounter with the patient was in whole, or in part for the following medical condition(s) which is the primary reason for home health care (List medical condition): Gait abnormality, right eye blindness,   The encounter with the patient was in whole, or in part, for the following medical condition, which is the primary reason for home health care:  Glaucoma, blindness, gait abnormality,   I certify that, based on my findings, the following services are medically necessary home health services:  Physical therapy   Reason for Medically Necessary Home Health Services:  Therapy- Therapeutic Exercises to Increase Strength and Endurance   My clinical findings support the need for the above services:  Unsafe ambulation due to balance issues   Further, I certify that my clinical findings support that this patient is homebound due to:  Unsafe ambulation due to balance issues   Please evaluate the patient and plan PT/OT as well a social status.   For home use only DME Shower stool    Complete by:  As directed    Home Health    Complete by:  As directed    To provide the following care/treatments:   PT [place] work  Increase activity slowly    Complete by:  As directed      Signed: Kathi Ludwig, MD 07/15/2017, 2:40 PM   Pager: Pager# [phone]

## 2017-07-13 NOTE — Discharge Instructions (Signed)
You will need to check your blood sugar frequently throughout today. Continue your medications as usual. Return here with any new symptoms or concerns.   Please call your PCP to schedule an appointment within 1-2 weeks.  Thank you for your visit to [facility].

## 2017-07-13 NOTE — Progress Notes (Signed)
Notified that BP is 186/84, given Hydralazine PO 50mg , no response, given 200 labetalol (half home dose), patient states she has this occur regularly and when it does, it takes 24hrs to return to normal. Ordered second dose of hydralazine 50mg  PO if pressure stays elevated. Okay to discharge on home meds as soon as ride is available. Please inform patient to call if her symptoms fail to resolve as they typically do.

## 2017-07-13 NOTE — Plan of Care (Signed)
Problem: Food- and Nutrition-Related Knowledge Deficit (NB-1.1) Goal: Nutrition education Formal process to instruct or train a patient/client in a skill or to impart knowledge to help patients/clients voluntarily manage or modify food choices and eating behavior to maintain or improve health.  Outcome: Adequate for Discharge  RD completed diet education for low potassium diet and carb modified diet per pt request.   Lab Results  Component Value Date   HGBA1C 6.5 (H) 07/12/2017   Pt reports she is interested in optimizing blood sugar control. She is also interested in lower potassium in diet, due to upcoming needs to start HD next month.   RD provided "Carbohydrate Counting for People with Diabetes" and "Potassium Content of Foods", and "Low Potassium Foods" handouts from the Academy of Nutrition and Dietetics. Discussed different food groups and their effects on blood sugar, emphasizing carbohydrate-containing foods. Provided list of carbohydrates and recommended serving sizes of common foods.  Discussed importance of controlled and consistent carbohydrate intake throughout the day. Provided examples of ways to balance meals/snacks and encouraged intake of high-fiber, whole grain complex carbohydrates.   Reviewed diet recall and provided specific examples of ways pt could decrease potassium diet as well as substitutions for lower potassium food items.Teach back method used.  Expect fair to good compliance.  Body mass index is 28.15 kg/m. Pt meets criteria for overweight based on current BMI.  Current diet order is Carb Modified, patient is consuming approximately 100% of meals at this time. Labs and medications reviewed. No further nutrition interventions warranted at this time. RD contact information provided. If additional nutrition issues arise, please re-consult RD.  Tammy Moses A. Tammy Moses, RD, LDN, CDE Pager: [phone] After hours Pager: [phone]

## 2017-07-13 NOTE — Progress Notes (Signed)
   Subjective: Patient was standing attempting to enter her bed today when the team entered the room. She stated that she may need a little assistance getting into bed, but managed to accomplish the task with only minimal assistance. She stated today that she felt much better, was still not sure what happened, but thought perhaps it was related to her insulin. She was in agreement with being discharged home today but would like to speak with case management regarding home health needs. She denied nausea, vomiting, lightheadedness, chest pain, abdominal pain or other concerning symptoms.   Objective:  Vital signs in last 24 hours: Vitals:   07/12/17 2105 07/13/17 0412 07/13/17 0413 07/13/17 0832  BP: (!) 155/80 (!) 162/75 (!) 161/73 (!) 155/70  Pulse: 88 87 86 88  Resp: 16 17  16   Temp: 98.4 F (36.9 C) 98.2 F (36.8 C)  98.5 F (36.9 C)  TempSrc: Oral Oral  Oral  SpO2: 100% 99% 100% 100%  Weight:   149 lb (67.6 kg)   Height:       ROS was negative except as per HPI.  Physical Exam  Constitutional: She appears well-developed and well-nourished. No distress.  HENT:  Head: Normocephalic and atraumatic.  Eyes: Right pupil is not reactive.  Blind in right eye   Neck: Normal range of motion.  Cardiovascular: Normal rate and regular rhythm.   Pulmonary/Chest: Effort normal and breath sounds normal. No respiratory distress.  Abdominal: Soft. Bowel sounds are normal. She exhibits no distension. There is no tenderness.  Musculoskeletal: She exhibits no edema or tenderness.  Neurological: She is alert.  Skin: Skin is warm. She is not diaphoretic.  Psychiatric: She has a normal mood and affect.   Assessment/Plan:  Principal Problem:   Hypoglycemia Active Problems:   BLINDNESS, RIGHT EYE   Essential hypertension   GERD   Abnormality of gait   Diabetes mellitus (HCC)   CKD (chronic kidney disease) stage 5, GFR less than 15 ml/min (HCC)   Anemia   Alcoholic intoxication without  complication (HCC)  Hypoglycemia: Resolved CBG 212 and maintaining  Patient most likely ingested an excess of her insulin  Cleared for discharge today with CM on board for home health needs  Syncopal episode, possible syncope at bus stop, most likely secondary to hypoglycemia   HCV: Genotype 1b treated w/ Harvoni by Dr. Linus Salmons  EtOH intoxication: EtOH level 109 on arrival last night. Patient is no longer intoxicated, at baseline mentally  Normocytic anemia: Iron studies: Ferritin 101, Iron 58, TIBC 269 Most likely anemia of chronic disease    Dispo: Anticipated discharge in approximately today(s).   Kathi Ludwig, MD 07/13/2017, 11:35 AM Pager: Pager# [phone]

## 2017-07-13 NOTE — Progress Notes (Signed)
   07/13/17 1609  Vitals  BP (!) 184/86  BP Location Right Arm  BP Method Manual  Patient Position (if appropriate) Lying  Pulse Rate 93  notified provider

## 2017-07-13 NOTE — Care Management Note (Addendum)
Case Management Note  Patient Details  Name: Tammy Moses MRN: [id number] Date of Birth: 1966-02-29  Subjective/Objective:             Presents with hypoglycemia.      HUO:[id number] [place]  Action/Plan: Plan is to d/c to home today. Per PT's recommendations HHPT with supervision, however, pt will not have 24/7 supervision once d/c and refuses SNF placement. Pt lives alone. Pt states family ( son(Jeremy), niece,nephew) will assist with care once d/c. Pt receives PCS qd M-F /3.5 hrs and 2.5 hrs on weekends  which is provided by medicaid. Pt states will need transportation to home. CM will arrange transportation services via [facility] for pt to get home.  Expected Discharge Date:  07/13/17               Expected Discharge Plan:  [place]  In-House Referral:     Discharge planning Services  CM Consult  Post Acute Care Choice:    Choice offered to:     DME Arranged:   [facility] DME Agency:   shower stool  HH Arranged:   PT,OT,SW HH Agency:   [facility]  Status of Service:  Completed, signed off  If discussed at [place] of Stay Meetings, dates discussed:    Additional Comments:  Sharin Mons, RN 07/13/2017, 12:43 PM

## 2017-07-13 NOTE — Progress Notes (Addendum)
Discharge order received, care manger Levada Dy has been notified, as patient need home health and possible equipment.    CBG 211 still okay to discharge per provider.
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
## 2017-07-13 NOTE — Progress Notes (Signed)
  Date: 07/13/2017  Patient name: JANESSA MICKLE  Medical record number: [id number]  Date of birth: [birth year]   I have seen and evaluated this patient and I have discussed the plan of care with the house staff. Please see Dr. Nelma Rothman note for complete details. I concur with his findings with the following additions/corrections:   We will discontinue glimepiride on discharge and decrease her insulin.  I think she was overtreated for her DM given her 2 episodes of hypoglycemia since starting Lantus.  She should follow closely with her primary care provider.    Sid Falcon, MD 07/13/2017, 2:42 PM
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
## 2017-07-27 ENCOUNTER — Telehealth: Payer: Self-pay | Admitting: Family Medicine

## 2017-07-27 NOTE — Telephone Encounter (Signed)
Explanation for need of electronic wheelchair faxed to [facility].  Tammy Moses. [name], MSN, FNP-C [facility] Tammy Moses  [address] [name] Tammy Moses,  [phone]

## 2017-07-27 NOTE — Congregational Nurse Program (Signed)
Congregational Nurse Program Note  Date of Encounter: 06/28/2017  Past Medical History: Past Medical History:  Diagnosis Date  . Anemia   . Anxiety   . Blind right eye   . Constipation, chronic   . Dental caries   . Diabetes mellitus   . Diabetic neuropathy ([place])   . Diabetic retinopathy   . GERD (gastroesophageal reflux disease)   . Glaucoma   . H. pylori infection   . Hepatitis C carrier ([place])   . High risk sexual behavior   . Hyperlipidemia   . Hypertension   . Hypoglycemia 07/12/2017  . Insomnia   . Microalbuminuria   . Nonspecific elevation of levels of transaminase or lactic acid dehydrogenase (LDH)   . Tobacco dependence   . Vitamin D deficiency     Encounter Details:     CNP Questionnaire - 06/28/17 0945      Patient Demographics   Is this a new or existing patient? New   Patient is considered a/an Not Applicable   Race African-American/Black     Patient Assistance   Location of Patient Assistance [place]   Patient's financial/insurance status Medicaid;Low Income   Uninsured Patient ([facility]) No   Patient referred to apply for the following financial assistance Not Applicable   Food insecurities addressed Not Applicable   Transportation assistance No   Type of Assistance Other   Assistance securing medications No   Educational health offerings Chronic disease     Encounter Details   Primary purpose of visit Education/Health Concerns   Was an Emergency Department visit averted? Not Applicable   Does patient have a medical provider? Yes   Patient referred to Not Applicable   Was a mental health screening completed? (GAINS tool) No   Does patient have dental issues? No   Does patient have vision issues? No   Does your patient have an abnormal blood pressure today? No   Since previous encounter, have you referred patient for abnormal blood pressure that resulted in a new diagnosis or medication change? No   Does your patient have an  abnormal blood glucose today? No   Since previous encounter, have you referred patient for abnormal blood glucose that resulted in a new diagnosis or medication change? No   Was there a life-saving intervention made? No     BP check. Client states that she is starting dialysis at [facility] and is a patient at [facility]. She is leaving [employer] and requests assistance with furniture. CN directed client to Barnett Applebaum, Tourist information centre manager. CN will assist with shower chair for client.

## 2017-08-08 ENCOUNTER — Ambulatory Visit: Payer: Medicare Other | Admitting: Family Medicine

## 2017-08-22 ENCOUNTER — Other Ambulatory Visit: Payer: Self-pay | Admitting: Family Medicine

## 2017-08-22 ENCOUNTER — Ambulatory Visit: Payer: Medicare Other | Admitting: Neurology

## 2017-08-22 ENCOUNTER — Telehealth: Payer: Self-pay | Admitting: *Deleted

## 2017-08-22 MED ORDER — HYDRALAZINE HCL 100 MG PO TABS: 100.0000 mg | ORAL_TABLET | Freq: Two times a day (BID) | ORAL | 0 refills | Status: DC

## 2017-08-22 NOTE — Telephone Encounter (Signed)
No showed follow up appointment. 

## 2017-08-23 ENCOUNTER — Encounter: Payer: Self-pay | Admitting: Neurology

## 2017-09-02 ENCOUNTER — Other Ambulatory Visit: Payer: Self-pay | Admitting: Family Medicine

## 2017-09-02 ENCOUNTER — Telehealth: Payer: Self-pay | Admitting: Family Medicine

## 2017-09-02 NOTE — Telephone Encounter (Signed)
Please contact patient to schedule a follow-up, chronic disease management visit. 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

## 2017-09-05 ENCOUNTER — Other Ambulatory Visit: Payer: Self-pay | Admitting: Family Medicine

## 2017-09-27 ENCOUNTER — Ambulatory Visit: Payer: Medicare Other | Admitting: Family Medicine

## 2017-09-28 ENCOUNTER — Telehealth: Payer: Self-pay

## 2017-09-28 NOTE — Telephone Encounter (Signed)
Patient notified and states that she has a appointment on 12/142018. Patient would like to know if she can get her nebulizer solution refilled

## 2017-09-28 NOTE — Telephone Encounter (Signed)
No medication refills until patient comes in for office visit

## 2017-09-28 NOTE — Telephone Encounter (Signed)
Patient requesting a refill on Lisinopril 90 qty but I don't see on patients chart

## 2017-09-29 NOTE — Telephone Encounter (Signed)
Left a vm for patient to call back and let us know which medication she needs

## 2017-10-05 DIAGNOSIS — N179 Acute kidney failure, unspecified: Secondary | ICD-10-CM | POA: Insufficient documentation

## 2017-10-05 DIAGNOSIS — J189 Pneumonia, unspecified organism: Secondary | ICD-10-CM | POA: Insufficient documentation

## 2017-10-05 DIAGNOSIS — N185 Chronic kidney disease, stage 5: Secondary | ICD-10-CM

## 2017-10-07 ENCOUNTER — Ambulatory Visit: Payer: Medicare Other | Admitting: Family Medicine

## 2017-10-21 ENCOUNTER — Other Ambulatory Visit: Payer: Self-pay | Admitting: Family Medicine

## 2017-10-27 ENCOUNTER — Other Ambulatory Visit: Payer: Self-pay | Admitting: Family Medicine

## 2017-11-08 ENCOUNTER — Other Ambulatory Visit: Payer: Self-pay | Admitting: Family Medicine

## 2017-11-30 ENCOUNTER — Telehealth: Payer: Self-pay

## 2017-11-30 MED ORDER — BLOOD GLUCOSE MONITOR KIT: PACK | 0 refills | Status: DC

## 2017-11-30 NOTE — Telephone Encounter (Signed)
Please contact patient to determine which pharmacy she would like her glucometer prescription sent to.   Tammy Moses. [name], MSN, FNP-C [facility]  [address] [place], [place] [address] [phone]

## 2017-12-01 NOTE — Telephone Encounter (Signed)
Tried to contact patient no answer and no vm

## 2017-12-02 NOTE — Telephone Encounter (Signed)
Patient states meter was already sent to pharmacy

## 2017-12-06 ENCOUNTER — Ambulatory Visit: Payer: Medicare Other | Admitting: Family Medicine

## 2017-12-13 ENCOUNTER — Encounter: Payer: Self-pay | Admitting: Family Medicine

## 2017-12-13 ENCOUNTER — Ambulatory Visit (INDEPENDENT_AMBULATORY_CARE_PROVIDER_SITE_OTHER): Payer: Medicare Other | Admitting: Family Medicine

## 2017-12-13 VITALS — BP 124/68 | HR 92 | Temp 98.1°F | Resp 14 | Ht 61.0 in | Wt 142.0 lb

## 2017-12-13 DIAGNOSIS — E1339 Other specified diabetes mellitus with other diabetic ophthalmic complication: Secondary | ICD-10-CM | POA: Diagnosis not present

## 2017-12-13 DIAGNOSIS — Z992 Dependence on renal dialysis: Secondary | ICD-10-CM

## 2017-12-13 DIAGNOSIS — Z794 Long term (current) use of insulin: Secondary | ICD-10-CM

## 2017-12-13 DIAGNOSIS — I10 Essential (primary) hypertension: Secondary | ICD-10-CM | POA: Diagnosis not present

## 2017-12-13 LAB — CBC WITH DIFFERENTIAL/PLATELET
BASOS ABS: 0.1 10*3/uL (ref 0.0–0.2)
Basos: 1 %
EOS (ABSOLUTE): 0.3 10*3/uL (ref 0.0–0.4)
Eos: 3 %
HEMATOCRIT: 37.8 % (ref 34.0–46.6)
HEMOGLOBIN: 12.4 g/dL (ref 11.1–15.9)
LYMPHS: 39 %
Lymphocytes Absolute: 4.1 10*3/uL — ABNORMAL HIGH (ref 0.7–3.1)
MCH: 27.3 pg (ref 26.6–33.0)
MCHC: 32.8 g/dL (ref 31.5–35.7)
MCV: 83 fL (ref 79–97)
MONOCYTES: 10 %
Monocytes Absolute: 1 10*3/uL — ABNORMAL HIGH (ref 0.1–0.9)
NEUTROS ABS: 5.2 10*3/uL (ref 1.4–7.0)
NEUTROS PCT: 47 %
Platelets: 254 10*3/uL (ref 150–379)
RBC: 4.55 x10E6/uL (ref 3.77–5.28)
RDW: 15.8 % — ABNORMAL HIGH (ref 12.3–15.4)
WBC: 10.7 10*3/uL (ref 3.4–10.8)

## 2017-12-13 LAB — POCT URINALYSIS DIP (DEVICE)
BILIRUBIN URINE: NEGATIVE
GLUCOSE, UA: NEGATIVE mg/dL
Ketones, ur: NEGATIVE mg/dL
NITRITE: NEGATIVE
Protein, ur: >=300 mg/dL — AB
UROBILINOGEN UA: 0.2 mg/dL (ref 0.0–1.0)
pH: 5.5 (ref 5.0–8.0)

## 2017-12-13 LAB — POCT GLYCOSYLATED HEMOGLOBIN (HGB A1C): Hemoglobin A1C: 5.9

## 2017-12-13 MED ORDER — CLONIDINE HCL 0.2 MG/24HR TD PTWK: 0.2000 mg | MEDICATED_PATCH | TRANSDERMAL | 12 refills | Status: DC

## 2017-12-13 MED ORDER — OMEPRAZOLE 20 MG PO CPDR: 20.0000 mg | DELAYED_RELEASE_CAPSULE | Freq: Every day | ORAL | 11 refills | Status: DC

## 2017-12-13 MED ORDER — BUPROPION HCL ER (SR) 150 MG PO TB12: 150.0000 mg | ORAL_TABLET | Freq: Two times a day (BID) | ORAL | 2 refills | Status: DC

## 2017-12-13 MED ORDER — ALBUTEROL SULFATE HFA 108 (90 BASE) MCG/ACT IN AERS: 2.0000 | INHALATION_SPRAY | RESPIRATORY_TRACT | 3 refills | Status: DC | PRN

## 2017-12-13 MED ORDER — FUROSEMIDE 20 MG PO TABS: 20.0000 mg | ORAL_TABLET | Freq: Every day | ORAL | 1 refills | Status: DC | PRN

## 2017-12-13 MED ORDER — GABAPENTIN (ONCE-DAILY) 600 MG PO TABS: 600.0000 mg | ORAL_TABLET | Freq: Three times a day (TID) | ORAL | 6 refills | Status: DC

## 2017-12-13 MED ORDER — VITAMIN D-3 25 MCG (1000 UT) PO CAPS: 2000.0000 [IU] | ORAL_CAPSULE | Freq: Every day | ORAL | 3 refills | Status: DC

## 2017-12-13 MED ORDER — PRAVASTATIN SODIUM 80 MG PO TABS: 80.0000 mg | ORAL_TABLET | Freq: Every day | ORAL | 0 refills | Status: DC

## 2017-12-13 MED ORDER — INSULIN GLARGINE 100 UNIT/ML SOLOSTAR PEN: 20.0000 [IU] | PEN_INJECTOR | Freq: Every day | SUBCUTANEOUS | 99 refills | Status: DC

## 2017-12-13 NOTE — Patient Instructions (Addendum)
Decrease Lantus 20 units at bedtime with high protein snack.  Your A1C today has improved to 5.9.  Continue blood pressure medications as prescribed.    I will see you back in 6 months! Contact us sooner if needed.    Diabetes Mellitus and Nutrition When you have diabetes (diabetes mellitus), it is very important to have healthy eating habits because your blood sugar (glucose) levels are greatly affected by what you eat and drink. Eating healthy foods in the appropriate amounts, at about the same times every day, can help you:  Control your blood glucose.  Lower your risk of heart disease.  Improve your blood pressure.  Reach or maintain a healthy weight.  Every person with diabetes is different, and each person has different needs for a meal plan. Your health care provider may recommend that you work with a diet and nutrition specialist (dietitian) to make a meal plan that is best for you. Your meal plan may vary depending on factors such as:  The calories you need.  The medicines you take.  Your weight.  Your blood glucose, blood pressure, and cholesterol levels.  Your activity level.  Other health conditions you have, such as heart or kidney disease.  How do carbohydrates affect me? Carbohydrates affect your blood glucose level more than any other type of food. Eating carbohydrates naturally increases the amount of glucose in your blood. Carbohydrate counting is a method for keeping track of how many carbohydrates you eat. Counting carbohydrates is important to keep your blood glucose at a healthy level, especially if you use insulin or take certain oral diabetes medicines. It is important to know how many carbohydrates you can safely have in each meal. This is different for every person. Your dietitian can help you calculate how many carbohydrates you should have at each meal and for snack. Foods that contain carbohydrates include:  Bread, cereal, rice, pasta, and  crackers.  Potatoes and corn.  Peas, beans, and lentils.  Milk and yogurt.  Fruit and juice.  Desserts, such as cakes, cookies, ice cream, and candy.  How does alcohol affect me? Alcohol can cause a sudden decrease in blood glucose (hypoglycemia), especially if you use insulin or take certain oral diabetes medicines. Hypoglycemia can be a life-threatening condition. Symptoms of hypoglycemia (sleepiness, dizziness, and confusion) are similar to symptoms of having too much alcohol. If your health care provider says that alcohol is safe for you, follow these guidelines:  Limit alcohol intake to no more than 1 drink per day for nonpregnant women and 2 drinks per day for men. One drink equals 12 oz of beer, 5 oz of wine, or 1 oz of hard liquor.  Do not drink on an empty stomach.  Keep yourself hydrated with water, diet soda, or unsweetened iced tea.  Keep in mind that regular soda, juice, and other mixers may contain a lot of sugar and must be counted as carbohydrates.  What are tips for following this plan? Reading food labels  Start by checking the serving size on the label. The amount of calories, carbohydrates, fats, and other nutrients listed on the label are based on one serving of the food. Many foods contain more than one serving per package.  Check the total grams (g) of carbohydrates in one serving. You can calculate the number of servings of carbohydrates in one serving by dividing the total carbohydrates by 15. For example, if a food has 30 g of total carbohydrates, it would be equal to  2 servings of carbohydrates.  Check the number of grams (g) of saturated and trans fats in one serving. Choose foods that have low or no amount of these fats.  Check the number of milligrams (mg) of sodium in one serving. Most people should limit total sodium intake to less than 2,300 mg per day.  Always check the nutrition information of foods labeled as "low-fat" or "nonfat". These foods  may be higher in added sugar or refined carbohydrates and should be avoided.  Talk to your dietitian to identify your daily goals for nutrients listed on the label. Shopping  Avoid buying canned, premade, or processed foods. These foods tend to be high in fat, sodium, and added sugar.  Shop around the outside edge of the grocery store. This includes fresh fruits and vegetables, bulk grains, fresh meats, and fresh dairy. Cooking  Use low-heat cooking methods, such as baking, instead of high-heat cooking methods like deep frying.  Cook using healthy oils, such as olive, canola, or sunflower oil.  Avoid cooking with butter, cream, or high-fat meats. Meal planning  Eat meals and snacks regularly, preferably at the same times every day. Avoid going long periods of time without eating.  Eat foods high in fiber, such as fresh fruits, vegetables, beans, and whole grains. Talk to your dietitian about how many servings of carbohydrates you can eat at each meal.  Eat 4-6 ounces of lean protein each day, such as lean meat, chicken, fish, eggs, or tofu. 1 ounce is equal to 1 ounce of meat, chicken, or fish, 1 egg, or 1/4 cup of tofu.  Eat some foods each day that contain healthy fats, such as avocado, nuts, seeds, and fish. Lifestyle   Check your blood glucose regularly.  Exercise at least 30 minutes 5 or more days each week, or as told by your health care provider.  Take medicines as told by your health care provider.  Do not use any products that contain nicotine or tobacco, such as cigarettes and e-cigarettes. If you need help quitting, ask your health care provider.  Work with a Social worker or diabetes educator to identify strategies to manage stress and any emotional and social challenges. What are some questions to ask my health care provider?  Do I need to meet with a diabetes educator?  Do I need to meet with a dietitian?  What number can I call if I have questions?  When are the  best times to check my blood glucose? Where to find more information:  American Diabetes Association: diabetes.org/food-and-fitness/food  Academy of Nutrition and Dietetics: PokerClues.dk  [employer] of Diabetes and Digestive and Kidney Diseases (NIH): ContactWire.be Summary  A healthy meal plan will help you control your blood glucose and maintain a healthy lifestyle.  Working with a diet and nutrition specialist (dietitian) can help you make a meal plan that is best for you.  Keep in mind that carbohydrates and alcohol have immediate effects on your blood glucose levels. It is important to count carbohydrates and to use alcohol carefully. This information is not intended to replace advice given to you by your health care provider. Make sure you discuss any questions you have with your health care provider. Document Released: 07/08/2005 Document Revised: 11/15/2016 Document Reviewed: 11/15/2016 Elsevier Interactive Patient Education  [employer].

## 2017-12-13 NOTE — Progress Notes (Signed)
Patient ID: Tammy Moses, female    DOB: Apr 05, 1966, 52 y.o.   MRN: [id number]  PCP: Scot Jun, FNP  Chief Complaint  Patient presents with  . Follow-up    HTN    Subjective:  HPI Tammy Moses is a 52 y.o. female with history hypertension, type 2 diabetes, current everyday smoker,  presents for chronic condition management. Last A1C 6.5 over 5 months prior. She has is currently managing her diabetes with Lantus only as she hasn't taken Onglyza since early summer of 2018. Last A1C Her blood pressure has been well controlled on her current medication regimen. Jovee suffer ESRD and attends HD @ [facility]. New fistula graft to the left arm and recently placed and is now being accessed for HD. Continues to urinate scant amounts. Chrishonda also complain of left foot and ankle pain which developed after tripping and falling. She denies syncope or dizziness occurred during the fall. Continues to have mobility of left foot complains of swelling and tenderness. Social History   Socioeconomic History  . Marital status: Married    Spouse name: Not on file  . Number of children: 2  . Years of education: 15  . Highest education level: Not on file  Social Needs  . Financial resource strain: Not on file  . Food insecurity - worry: Not on file  . Food insecurity - inability: Not on file  . Transportation needs - medical: Not on file  . Transportation needs - non-medical: Not on file  Occupational History    Employer: DISABLED    Comment: Disabled  Tobacco Use  . Smoking status: Current Every Day Smoker    Packs/day: 0.25    Types: Cigarettes  . Smokeless tobacco: Never Used  . Tobacco comment: trying to quit  Substance and Sexual Activity  . Alcohol use: Yes    Comment: 2-3 beers weekly  . Drug use: No    Comment: quit 3 months ago  . Sexual activity: Not on file  Other Topics Concern  . Not on file  Social History Narrative   Patient lives at home with he boyfriend.    Disabled.   Right handed.   Education 11 th grade.   Caffeine three cups of coffee daily.    Family History  Problem Relation Age of Onset  . High blood pressure Mother   . Diabetes Mother   . Thyroid disease Mother   . Diabetes Father   . High blood pressure Father    Review of Systems  Respiratory: Negative.   Cardiovascular: Negative.   Musculoskeletal: Positive for arthralgias and gait problem.  Psychiatric/Behavioral: Negative.      Patient Active Problem List   Diagnosis Date Noted  . Fall   . Hypoglycemia 07/12/2017  . CKD (chronic kidney disease) stage 5, GFR less than 15 ml/min (HCC) 07/12/2017  . Anemia 07/12/2017  . Alcoholic intoxication without complication ([place])   . Foot drop 05/26/2017  . Carpal tunnel syndrome of left wrist 05/20/2017  . 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 Peripheral neuropathy 05/10/2017  . Diabetes mellitus ([place]) 05/10/2017  . Gait abnormality 03/07/2014  . Abnormality of gait 03/07/2014  . Hepatitis C virus infection without hepatic coma 03/04/2014  . Erosive gastritis 02/14/2014  . BACK PAIN, LUMBAR 08/18/2010  . IDDM 08/11/2010  . HYPERCHOLESTEROLEMIA 08/11/2010  . HYPOKALEMIA 08/11/2010  . DEPRESSION 08/11/2010  . GLAUCOMA 08/11/2010  . BLINDNESS, RIGHT EYE 08/11/2010  . Essential hypertension 08/11/2010  .  GERD 08/11/2010  . CONSTIPATION 08/06/2009    No Known Allergies  Prior to Admission medications   Medication Sig Start Date End Date Taking? Authorizing Provider  albuterol (PROVENTIL HFA;VENTOLIN HFA) 108 (90 BASE) MCG/ACT inhaler Inhale 2 puffs into the lungs every 4 (four) hours as needed for shortness of breath. For shortness of breath   Yes [provider]  atropine 1 % ophthalmic solution Place 1 drop into both eyes 2 (two) times daily.   Yes [provider]  blood glucose meter kit and supplies KIT Dispense based on patient and insurance preference. Use up to four times daily  as directed. (FOR ICD-10 E.11.9) 11/30/17  Yes Scot Jun, FNP  buPROPion [facility] SR) 150 MG 12 hr tablet Take 1 tablet (150 mg total) by mouth 2 (two) times daily. 03/11/17  Yes Scot Jun, FNP  Cholecalciferol (VITAMIN D-3) 1000 units CAPS Take 2,000 Units by mouth daily.   Yes [provider]  cloNIDine (CATAPRES - DOSED IN MG/24 HR) 0.2 mg/24hr patch Place 1 patch (0.2 mg total) onto the skin once a week. 03/11/17  Yes Scot Jun, FNP  furosemide (LASIX) 20 MG tablet Take 20 mg by mouth daily.   Yes [provider]  Gabapentin, Once-Daily, 600 MG TABS Take 600 mg by mouth 3 (three) times daily. 09/07/16  Yes Micheline Chapman, NP  glucose blood (ONE TOUCH ULTRA TEST) test strip Use as instructed 04/30/16  Yes Micheline Chapman, NP  hydrALAZINE (APRESOLINE) 100 MG tablet Take 1 tablet (100 mg total) by mouth 2 (two) times daily. 08/22/17  Yes Scot Jun, FNP  Insulin Glargine (LANTUS SOLOSTAR) 100 UNIT/ML Solostar Pen Inject 40 Units into the skin daily at 10 pm. 07/13/17  Yes Kathi Ludwig, MD  Insulin Pen Needle 29G X 12MM MISC E11.9 Dx Code Use once at bedtime with each nightly insulin 03/11/17  Yes Scot Jun, FNP  ketotifen (ZADITOR) 0.025 % ophthalmic solution Place 1 drop into both eyes 2 (two) times daily. 03/30/16  Yes Mabe, Shanon Brow, NP  labetalol (NORMODYNE) 200 MG tablet Take 400 mg by mouth daily. 06/07/17  Yes [provider]  Lancets Glory Rosebush ULTRASOFT) lancets Use as instructed 04/30/16  Yes Micheline Chapman, NP  omeprazole (PRILOSEC) 20 MG capsule Take 1 capsule (20 mg total) by mouth daily. Has to be taken at the same time with Harvoni FASTED 03/11/17  Yes Scot Jun, FNP  pravastatin (PRAVACHOL) 80 MG tablet Take 1 tablet (80 mg total) by mouth daily. 10/26/17  Yes Scot Jun, FNP  saxagliptin HCl (ONGLYZA) 2.5 MG TABS tablet Take 1 tablet (2.5 mg total) by mouth daily. 03/11/17  Yes Scot Jun,  FNP  timolol (TIMOPTIC) 0.5 % ophthalmic solution Place 1 drop into the left eye daily. (100/1=100) 05/23/17  Yes [provider]  Travoprost, BAK Free, (TRAVATAN) 0.004 % SOLN ophthalmic solution Place 1 drop into both eyes at bedtime.   Yes [provider]  trimethoprim-polymyxin b (POLYTRIM) ophthalmic solution Place 1 drop into both eyes every 4 (four) hours. Patient not taking: Reported on 12/13/2017 03/30/16   Janne Napoleon, NP    Past Medical, Surgical Family and Social History reviewed and updated.    Objective:   Today's Vitals   12/13/17 1331  BP: 124/68  Pulse: 92  Resp: 14  Temp: 98.1 F (36.7 C)  TempSrc: Oral  SpO2: 100%  Weight: 142 lb (64.4 kg)  Height: 5' 1"  (1.549 m)  Wt Readings from Last 3 Encounters:  12/13/17 142 lb (64.4 kg)  07/13/17 149 lb (67.6 kg)  06/09/17 139 lb (63 kg)   Physical Exam  Constitutional: She appears ill.  HENT:  Head: Normocephalic and atraumatic.  Right Ear: External ear normal.  Left Ear: External ear normal.  Nose: Nose normal.  Mouth/Throat: Oropharynx is clear and moist.  Eyes: Conjunctivae are normal. Pupils are equal, round, and reactive to light.  Neck: Normal range of motion. Neck supple.  Cardiovascular: Normal rate, regular rhythm, normal heart sounds and intact distal pulses.  Thrill palpable and bruit auscultated in left forearm fistula   Pulmonary/Chest: Effort normal and breath sounds normal.  Abdominal: Soft. Bowel sounds are normal. There is no tenderness.  Lymphadenopathy:    She has no cervical adenopathy.  Skin: Skin is warm and dry.  Psychiatric: She has a normal mood and affect. Her behavior is normal. Judgment and thought content normal.  Vitals reviewed.  Assessment & Plan:  1. 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 Improved. Reduce Lantus 20 units at bedtime with high protein snack. Do not skip  meals. Recommend consistent carbohydrate meal plan. Discontinued Onglyza, patient doesn't recall taking.  2. Hypertension, essential  We have discussed target BP range and blood pressure goal. I have advised patient to check BP regularly and to call us back or report to clinic if the numbers are consistently higher than 140/90. We discussed the importance of compliance with medical therapy and DASH diet recommended, consequences of uncontrolled hypertension discussed.  - continue current BP medications   Orders Placed This Encounter  Procedures  . Comprehensive metabolic panel  . CBC with Differential  . TSH  . Ambulatory referral to Podiatry  . POCT glycosylated hemoglobin (Hb A1C)  . POCT urinalysis dip (device)   Meds ordered this encounter  Medications  . omeprazole (PRILOSEC) 20 MG capsule    Sig: Take 1 capsule (20 mg total) by mouth daily. Has to be taken at the same time with Harvoni FASTED    Dispense:  30 capsule    Refill:  11    Order Specific Question:   Supervising Provider    Answer:   Tresa Garter [[id number]]  . pravastatin (PRAVACHOL) 80 MG tablet    Sig: Take 1 tablet (80 mg total) by mouth daily.    Dispense:  15 tablet    Refill:  0    Patient needs a office visit    Order Specific Question:   Supervising Provider    Answer:   Tresa Garter [id number]  . Gabapentin, Once-Daily, 600 MG TABS    Sig: Take 600 mg by mouth 3 (three) times daily.    Dispense:  180 tablet    Refill:  6    Order Specific Question:   Supervising Provider    Answer:   Tresa Garter [id number]  . furosemide (LASIX) 20 MG tablet    Sig: Take 1 tablet (20 mg total) by mouth daily as needed.    Dispense:  30 tablet    Refill:  1    Order Specific Question:   Supervising Provider    Answer:   Tresa Garter [id number]  . cloNIDine (CATAPRES - DOSED IN MG/24 HR) 0.2 mg/24hr patch    Sig: Place 1 patch (0.2 mg total) onto the skin once a week.    Dispense:  4  patch  Refill:  12    Order Specific Question:   Supervising Provider    Answer:   Tresa Garter [id number]  . buPROPion (WELLBUTRIN SR) 150 MG 12 hr tablet    Sig: Take 1 tablet (150 mg total) by mouth 2 (two) times daily.    Dispense:  60 tablet    Refill:  2    Order Specific Question:   Supervising Provider    Answer:   Tresa Garter [id number]  . Cholecalciferol (VITAMIN D-3) 1000 units CAPS    Sig: Take 2 capsules (2,000 Units total) by mouth daily.    Dispense:  90 capsule    Refill:  3    Order Specific Question:   Supervising Provider    Answer:   Tresa Garter [id number]  . albuterol (PROVENTIL HFA;VENTOLIN HFA) 108 (90 Base) MCG/ACT inhaler    Sig: Inhale 2 puffs into the lungs every 4 (four) hours as needed for shortness of breath. For shortness of breath    Dispense:  1 Inhaler    Refill:  3    Order Specific Question:   Supervising Provider    Answer:   Tresa Garter [id number]  . Insulin Glargine (LANTUS SOLOSTAR) 100 UNIT/ML Solostar Pen    Sig: Inject 20 Units into the skin daily at 10 pm.    Dispense:  5 pen    Refill:  PRN    Order Specific Question:   Supervising Provider    Answer:   Tresa Garter [[id number]]     RTC: 6 months chronic condition management   Carroll Sage. [name], MSN, FNP-C [facility]  [address] [place],  [address] [phone]

## 2017-12-13 NOTE — Progress Notes (Signed)
POCT

## 2017-12-14 LAB — TSH: TSH: 2.69 u[IU]/mL (ref 0.450–4.500)

## 2017-12-17 ENCOUNTER — Other Ambulatory Visit: Payer: Self-pay | Admitting: Family Medicine

## 2018-01-09 ENCOUNTER — Other Ambulatory Visit: Payer: Self-pay | Admitting: Family Medicine

## 2018-02-01 ENCOUNTER — Other Ambulatory Visit: Payer: Self-pay | Admitting: Family Medicine

## 2018-02-09 ENCOUNTER — Ambulatory Visit: Payer: Medicare Other | Admitting: Podiatry

## 2018-02-17 ENCOUNTER — Other Ambulatory Visit: Payer: Self-pay | Admitting: Family Medicine

## 2018-02-17 DIAGNOSIS — Z1231 Encounter for screening mammogram for malignant neoplasm of breast: Secondary | ICD-10-CM

## 2018-02-23 ENCOUNTER — Encounter: Payer: Medicare Other | Admitting: Podiatry

## 2018-02-24 NOTE — Progress Notes (Signed)
Erroneous

## 2018-03-09 ENCOUNTER — Ambulatory Visit
Admission: RE | Admit: 2018-03-09 | Discharge: 2018-03-09 | Disposition: A | Discharge status: Home / Self Care | Payer: Medicare Other | Source: Ambulatory Visit | Attending: Family Medicine | Admitting: Family Medicine

## 2018-03-09 ENCOUNTER — Ambulatory Visit: Payer: Medicare Other

## 2018-03-09 DIAGNOSIS — Z1231 Encounter for screening mammogram for malignant neoplasm of breast: Secondary | ICD-10-CM

## 2018-03-29 ENCOUNTER — Other Ambulatory Visit: Payer: Self-pay | Admitting: Family Medicine

## 2018-05-23 LAB — HM DIABETES EYE EXAM

## 2018-06-08 ENCOUNTER — Other Ambulatory Visit: Payer: Self-pay | Admitting: Internal Medicine

## 2018-06-13 ENCOUNTER — Encounter: Payer: Self-pay | Admitting: Family Medicine

## 2018-06-13 ENCOUNTER — Ambulatory Visit (INDEPENDENT_AMBULATORY_CARE_PROVIDER_SITE_OTHER): Payer: Medicare Other | Admitting: Family Medicine

## 2018-06-13 VITALS — BP 116/70 | HR 74 | Temp 97.9°F | Ht 61.0 in | Wt 145.0 lb

## 2018-06-13 DIAGNOSIS — R8290 Unspecified abnormal findings in urine: Secondary | ICD-10-CM

## 2018-06-13 DIAGNOSIS — Z09 Encounter for follow-up examination after completed treatment for conditions other than malignant neoplasm: Secondary | ICD-10-CM

## 2018-06-13 DIAGNOSIS — E1122 Type 2 diabetes mellitus with diabetic chronic kidney disease: Secondary | ICD-10-CM

## 2018-06-13 DIAGNOSIS — Z992 Dependence on renal dialysis: Secondary | ICD-10-CM

## 2018-06-13 DIAGNOSIS — N390 Urinary tract infection, site not specified: Secondary | ICD-10-CM

## 2018-06-13 DIAGNOSIS — N185 Chronic kidney disease, stage 5: Secondary | ICD-10-CM

## 2018-06-13 DIAGNOSIS — G4700 Insomnia, unspecified: Secondary | ICD-10-CM

## 2018-06-13 DIAGNOSIS — R319 Hematuria, unspecified: Secondary | ICD-10-CM

## 2018-06-13 LAB — POCT URINALYSIS DIP (MANUAL ENTRY)
Glucose, UA: NEGATIVE mg/dL
Nitrite, UA: NEGATIVE
Protein Ur, POC: >=300 mg/dL — AB
Spec Grav, UA: 1.025 (ref 1.010–1.025)
Urobilinogen, UA: 0.2 E.U./dL
pH, UA: 5.5 (ref 5.0–8.0)

## 2018-06-13 LAB — POCT GLYCOSYLATED HEMOGLOBIN (HGB A1C): Hemoglobin A1C: 6.7 % — AB (ref 4.0–5.6)

## 2018-06-13 MED ORDER — SULFAMETHOXAZOLE-TRIMETHOPRIM 800-160 MG PO TABS: 1.0000 | ORAL_TABLET | Freq: Two times a day (BID) | ORAL | 0 refills | Status: DC

## 2018-06-13 MED ORDER — TRAZODONE HCL 50 MG PO TABS: 50.0000 mg | ORAL_TABLET | Freq: Every evening | ORAL | 3 refills | Status: DC | PRN

## 2018-06-13 NOTE — Patient Instructions (Addendum)
Sulfamethoxazole; Trimethoprim, SMX-TMP oral suspension What is this medicine? SULFAMETHOXAZOLE; TRIMETHOPRIM or SMX-TMP (suhl fuh meth OK suh zohl; trye METH oh prim) is a combination of a sulfonamide antibiotic and a second antibiotic, trimethoprim. It is used to treat or prevent certain kinds of bacterial infections.It will not work for colds, flu, or other viral infections. This medicine may be used for other purposes; ask your health care provider or pharmacist if you have questions. COMMON BRAND NAME(S): Septra, Sulfatrim, Sulfatrim Pediatric, Sultrex Pediatric What should I tell my health care provider before I take this medicine? They need to know if you have any of these conditions: -anemia -asthma -being treated with anticonvulsants -if you frequently drink alcohol containing drinks -kidney disease -liver disease -low level of folic acid or [id number] dehydrogenase -poor nutrition or malabsorption -porphyria -severe allergies -thyroid disorder -an unusual or allergic reaction to sulfamethoxazole, trimethoprim, sulfa drugs, other medicines, foods, dyes, or preservatives -pregnant or trying to get pregnant -breast-feeding How should I use this medicine? Take this suspension by mouth. Follow the directions on the prescription label. Shake the bottle well before taking. Use a specially marked spoon or container to measure your medicine. Ask your pharmacist if you do not have one. Household spoons are not accurate. Take your doses at regular intervals. Do not take more medicine than directed. Talk to your pediatrician regarding the use of this medicine in children. Special care may be needed. While this drug may be prescribed for children as young as 108 months of age for selected conditions, precautions do apply. Overdosage: If you think you have taken too much of this medicine contact a poison control center or emergency room at once. NOTE: This medicine is only for you. Do not  share this medicine with others. What if I miss a dose? If you miss a dose, take it as soon as you can. If it is almost time for your next dose, take only that dose. Do not take double or extra doses. What may interact with this medicine? Do not take this medicine with any of the following medications -aminobenzoate potassium -dofetilide -metronidazole This medicine may also interact with the following medications -ACE inhibitors like benazepril, enalapril, lisinopril, and ramipril -birth control pills -cyclosporine -digoxin -diuretics -indomethacin -medicines for diabetes -methenamine -methotrexate -phenytoin -potassium supplements -pyrimethamine -sulfinpyrazone -tricyclic antidepressants -warfarin This list may not describe all possible interactions. Give your health care provider a list of all the medicines, herbs, non-prescription drugs, or dietary supplements you use. Also tell them if you smoke, drink alcohol, or use illegal drugs. Some items may interact with your medicine. What should I watch for while using this medicine? Tell your doctor or health care professional if your symptoms do not improve. Drink several glasses of water a day to reduce the risk of kidney problems. Do not treat diarrhea with over the counter products. Contact your doctor if you have diarrhea that lasts more than 2 days or if it is severe and watery. This medicine can make you more sensitive to the sun. Keep out of the sun. If you cannot avoid being in the sun, wear protective clothing and use a sunscreen. Do not use sun lamps or tanning beds/booths. What side effects may I notice from receiving this medicine? Side effects that you should report to your doctor or health care professional as soon as possible: -allergic reactions like skin rash or hives, swelling of the face, lips, or tongue -breathing problems -fever or chills, sore throat -irregular heartbeat, chest pain -  joint or muscle pain -pain  or difficulty passing urine -red pinpoint spots on skin -redness, blistering, peeling or loosening of the skin, including inside the mouth -unusual bleeding or bruising -unusual weakness or tiredness -yellowing of the eyes or skin Side effects that usually do not require medical attention (report to your doctor or health care professional if they continue or are bothersome): -diarrhea -dizziness -headache -loss of appetite -nausea, vomiting -nervousness This list may not describe all possible side effects. Call your doctor for medical advice about side effects. You may report side effects to FDA at 1-800-FDA-1088. Where should I keep my medicine? Keep out of the reach of children. Store at room temperature between 15 and 25 degrees C (59 and 77 degrees F). Protect from light and moisture. Throw away any unused medicine after the expiration date. NOTE: This sheet is a summary. It may not cover all possible information. If you have questions about this medicine, talk to your doctor, pharmacist, or health care provider.  2018 Elsevier/Gold Standard (2013-05-18 14:37:40) Trazodone tablets What is this medicine? TRAZODONE (TRAZ oh done) is used to treat depression. This medicine may be used for other purposes; ask your health care provider or pharmacist if you have questions. COMMON BRAND NAME(S): Desyrel What should I tell my health care provider before I take this medicine? They need to know if you have any of these conditions: -attempted suicide or thinking about it -bipolar disorder -bleeding problems -glaucoma -heart disease, or previous heart attack -irregular heart beat -kidney or liver disease -low levels of sodium in the blood -an unusual or allergic reaction to trazodone, other medicines, foods, dyes or preservatives -pregnant or trying to get pregnant -breast-feeding How should I use this medicine? Take this medicine by mouth with a glass of water. Follow the directions  on the prescription label. Take this medicine shortly after a meal or a light snack. Take your medicine at regular intervals. Do not take your medicine more often than directed. Do not stop taking this medicine suddenly except upon the advice of your doctor. Stopping this medicine too quickly may cause serious side effects or your condition may worsen. A special MedGuide will be given to you by the pharmacist with each prescription and refill. Be sure to read this information carefully each time. Talk to your pediatrician regarding the use of this medicine in children. Special care may be needed. Overdosage: If you think you have taken too much of this medicine contact a poison control center or emergency room at once. NOTE: This medicine is only for you. Do not share this medicine with others. What if I miss a dose? If you miss a dose, take it as soon as you can. If it is almost time for your next dose, take only that dose. Do not take double or extra doses. What may interact with this medicine? Do not take this medicine with any of the following medications: -certain medicines for fungal infections like fluconazole, itraconazole, ketoconazole, posaconazole, voriconazole -cisapride -dofetilide -dronedarone -linezolid -MAOIs like Carbex, Eldepryl, Marplan, Nardil, and Parnate -mesoridazine -methylene blue (injected into a vein) -pimozide -saquinavir -thioridazine -ziprasidone This medicine may also interact with the following medications: -alcohol -antiviral medicines for HIV or AIDS -aspirin and aspirin-like medicines -barbiturates like phenobarbital -certain medicines for blood pressure, heart disease, irregular heart beat -certain medicines for depression, anxiety, or psychotic disturbances -certain medicines for migraine headache like almotriptan, eletriptan, frovatriptan, naratriptan, rizatriptan, sumatriptan, zolmitriptan -certain medicines for seizures like carbamazepine and  phenytoin -certain  medicines for sleep -certain medicines that treat or prevent blood clots like dalteparin, enoxaparin, warfarin -digoxin -fentanyl -lithium -NSAIDS, medicines for pain and inflammation, like ibuprofen or naproxen -other medicines that prolong the QT interval (cause an abnormal heart rhythm) -rasagiline -supplements like St. John's wort, kava kava, valerian -tramadol -tryptophan This list may not describe all possible interactions. Give your health care provider a list of all the medicines, herbs, non-prescription drugs, or dietary supplements you use. Also tell them if you smoke, drink alcohol, or use illegal drugs. Some items may interact with your medicine. What should I watch for while using this medicine? Tell your doctor if your symptoms do not get better or if they get worse. Visit your doctor or health care professional for regular checks on your progress. Because it may take several weeks to see the full effects of this medicine, it is important to continue your treatment as prescribed by your doctor. Patients and their families should watch out for new or worsening thoughts of suicide or depression. Also watch out for sudden changes in feelings such as feeling anxious, agitated, panicky, irritable, hostile, aggressive, impulsive, severely restless, overly excited and hyperactive, or not being able to sleep. If this happens, especially at the beginning of treatment or after a change in dose, call your health care professional. Dennis Bast may get drowsy or dizzy. Do not drive, use machinery, or do anything that needs mental alertness until you know how this medicine affects you. Do not stand or sit up quickly, especially if you are an older patient. This reduces the risk of dizzy or fainting spells. Alcohol may interfere with the effect of this medicine. Avoid alcoholic drinks. This medicine may cause dry eyes and blurred vision. If you wear contact lenses you may feel some  discomfort. Lubricating drops may help. See your eye doctor if the problem does not go away or is severe. Your mouth may get dry. Chewing sugarless gum, sucking hard candy and drinking plenty of water may help. Contact your doctor if the problem does not go away or is severe. What side effects may I notice from receiving this medicine? Side effects that you should report to your doctor or health care professional as soon as possible: -allergic reactions like skin rash, itching or hives, swelling of the face, lips, or tongue -elevated mood, decreased need for sleep, racing thoughts, impulsive behavior -confusion -fast, irregular heartbeat -feeling faint or lightheaded, falls -feeling agitated, angry, or irritable -loss of balance or coordination -painful or prolonged erections -restlessness, pacing, inability to keep still -suicidal thoughts or other mood changes -tremors -trouble sleeping -seizures -unusual bleeding or bruising Side effects that usually do not require medical attention (report to your doctor or health care professional if they continue or are bothersome): -change in sex drive or performance -change in appetite or weight -constipation -headache -muscle aches or pains -nausea This list may not describe all possible side effects. Call your doctor for medical advice about side effects. You may report side effects to FDA at 1-800-FDA-1088. Where should I keep my medicine? Keep out of the reach of children. Store at room temperature between 15 and 30 degrees C (59 to 86 degrees F). Protect from light. Keep container tightly closed. Throw away any unused medicine after the expiration date. NOTE: This sheet is a summary. It may not cover all possible information. If you have questions about this medicine, talk to your doctor, pharmacist, or health care provider.  2018 Elsevier/Gold Standard (2016-03-11 16:57:05)

## 2018-06-13 NOTE — Progress Notes (Signed)
Follow Up  Subjective:    Patient ID: Tammy Moses, female    DOB: [birth year], 51 y.o.   MRN: [id number]  Chief Complaint  Patient presents with  . Follow-up    6 month on chronic condition    HPI  Tammy Moses has a past medical history of Kidney Disease, Tobacco Dependence, Insomnia, Hypoglycemia, Hypertension, Hyperlipidemia, Hepatitis C, Glaucoma, GERD, Diabetic Retinopathy, Diabetes, Dental Caries, Constipation, Anxiety, and Anemia. She is here today for follow up.    Current Status: Since her last office visit, she has been doing well. She continues to go to Dialysis on Mondays, Wednesdays, and Fridays. She states that she is pursuing Psychiatry.  She continues to have chronic back and leg pain, which she takes Acetaminophen as needed. She has occasional headaches and cough. She continues to have problems sleeping.   She is requesting Rx for new rolling walker.   She denies fevers, chills, fatigue, recent infections, weight loss, and night sweats.   She has not had any visual changes, dizziness, and falls.   No chest pain, heart palpitations, and shortness of breath reported.   No reports of GI problems such as nausea, vomiting, diarrhea, and constipation. She has no reports of blood in stools, dysuria and hematuria.   No depression or anxiety reported.  Review of Systems  Constitutional: Negative.   HENT: Negative.   Eyes: Negative.   Respiratory: Positive for cough (r/t smoking).   Cardiovascular: Negative.   Gastrointestinal: Negative.   Endocrine: Negative.   Genitourinary: Negative.   Musculoskeletal: Positive for arthralgias (generalized).  Skin:       Upper left arm graft   Allergic/Immunologic: Negative.   Neurological: Positive for headaches.  Hematological: Negative.   Psychiatric/Behavioral: Positive for sleep disturbance (insomnia).   Objective:   Physical Exam  Constitutional: She appears well-developed and well-nourished.  Cardiovascular: Normal  rate, regular rhythm, normal heart sounds and intact distal pulses.  Pulmonary/Chest: Effort normal and breath sounds normal.  Abdominal: Soft. Bowel sounds are normal.  Skin: Skin is warm and dry.  Psychiatric: She has a normal mood and affect. Her behavior is normal. Judgment and thought content normal.   Assessment & Plan:   1. Type 2 diabetes mellitus with stage 5 chronic kidney disease ([place]) Diialysis treatments.  Order for rolling walker given to patient today for insurance to order.  - POCT glycosylated hemoglobin (Hb A1C) - POCT urinalysis dipstick  2. Has received maintenance renal dialysis treatments [facility]) She receives Dialysis treatments on Mondays, Wednesdays, and Fridays.  She has been tolerating treatments well.   3. Insomnia, unspecified type We will initiate Trazodone today.  - traZODone (DESYREL) 50 MG tablet; Take 1 tablet (50 mg total) by mouth at bedtime as needed for sleep.  Dispense: 30 tablet; Refill: 3  4. Urinary tract infection with hematuria, site unspecified - sulfamethoxazole-trimethoprim (BACTRIM DS,SEPTRA DS) 800-160 MG tablet; Take 1 tablet by mouth 2 (two) times daily.  Dispense: 20 tablet; Refill: 0  5. Abnormal urinalysis - Urine Culture  6. Follow up She will follow up in 6 months.   Meds ordered this encounter  Medications  . sulfamethoxazole-trimethoprim (BACTRIM DS,SEPTRA DS) 800-160 MG tablet    Sig: Take 1 tablet by mouth 2 (two) times daily.    Dispense:  20 tablet    Refill:  0  . traZODone (DESYREL) 50 MG tablet    Sig: Take 1 tablet (50 mg total) by mouth at bedtime as needed for sleep.  Dispense:  30 tablet    Refill:  [place],  MSN, FNP-C [facility] [address] [place], [place] [address] [phone]

## 2018-06-16 LAB — URINE CULTURE

## 2018-06-17 ENCOUNTER — Other Ambulatory Visit: Payer: Self-pay | Admitting: Family Medicine

## 2018-06-17 DIAGNOSIS — B958 Unspecified staphylococcus as the cause of diseases classified elsewhere: Secondary | ICD-10-CM

## 2018-06-17 MED ORDER — CIPROFLOXACIN HCL 500 MG PO TABS: 500.0000 mg | ORAL_TABLET | Freq: Two times a day (BID) | ORAL | 0 refills | Status: AC

## 2018-06-17 NOTE — Progress Notes (Signed)
Rx for Cipro to pharmacy today.

## 2018-06-19 ENCOUNTER — Telehealth: Payer: Self-pay

## 2018-06-19 NOTE — Telephone Encounter (Signed)
-----   Message from [name], [place] sent at 06/17/2018  5:04 PM EDT ----- Regarding: "Urine Culture Lab Results" Tammy Moses,   Urine culture identified bacteria. Please inform patient that we have sent new Rx for Cipro to pharmacy today. This antibiotic is specific to eliminate this bacteria. She is to discontinue Septra if she has not completed and begin new antibiotic. She is to take medication as directed. She is to complete all medication..   Thank you.

## 2018-06-19 NOTE — Telephone Encounter (Signed)
Patient notified

## 2018-06-19 NOTE — Telephone Encounter (Signed)
-----   Message from [name], [place] sent at 06/17/2018  5:04 PM EDT ----- Regarding: "Urine Culture Lab Results" Morey Hummingbird,   Urine culture identified bacteria. Please inform patient that we have sent new Rx for Cipro to pharmacy today. This antibiotic is specific to eliminate this bacteria. She is to discontinue Septra if she has not completed and begin new antibiotic. She is to take medication as directed. She is to complete all medication..   Thank you.

## 2018-06-20 ENCOUNTER — Other Ambulatory Visit: Payer: Self-pay | Admitting: Family Medicine

## 2018-06-20 DIAGNOSIS — B958 Unspecified staphylococcus as the cause of diseases classified elsewhere: Secondary | ICD-10-CM

## 2018-07-12 ENCOUNTER — Emergency Department (HOSPITAL_COMMUNITY)
Admission: EM | Admit: 2018-07-12 | Discharge: 2018-07-12 | Disposition: A | Discharge status: Home / Self Care | Payer: Medicare Other | Attending: Emergency Medicine | Admitting: Emergency Medicine

## 2018-07-12 ENCOUNTER — Encounter (HOSPITAL_COMMUNITY): Payer: Self-pay | Admitting: *Deleted

## 2018-07-12 ENCOUNTER — Other Ambulatory Visit: Payer: Self-pay

## 2018-07-12 ENCOUNTER — Emergency Department (HOSPITAL_COMMUNITY): Payer: Medicare Other

## 2018-07-12 DIAGNOSIS — E119 Type 2 diabetes mellitus without complications: Secondary | ICD-10-CM | POA: Diagnosis not present

## 2018-07-12 DIAGNOSIS — I120 Hypertensive chronic kidney disease with stage 5 chronic kidney disease or end stage renal disease: Secondary | ICD-10-CM | POA: Diagnosis not present

## 2018-07-12 DIAGNOSIS — Z794 Long term (current) use of insulin: Secondary | ICD-10-CM | POA: Diagnosis not present

## 2018-07-12 DIAGNOSIS — F17210 Nicotine dependence, cigarettes, uncomplicated: Secondary | ICD-10-CM | POA: Insufficient documentation

## 2018-07-12 DIAGNOSIS — Z79899 Other long term (current) drug therapy: Secondary | ICD-10-CM | POA: Diagnosis not present

## 2018-07-12 DIAGNOSIS — N186 End stage renal disease: Secondary | ICD-10-CM

## 2018-07-12 DIAGNOSIS — R079 Chest pain, unspecified: Secondary | ICD-10-CM

## 2018-07-12 LAB — URINALYSIS, ROUTINE W REFLEX MICROSCOPIC
Bilirubin Urine: NEGATIVE
Glucose, UA: 50 mg/dL — AB
Ketones, ur: NEGATIVE mg/dL
Leukocytes, UA: NEGATIVE
Nitrite: NEGATIVE
PH: 7 (ref 5.0–8.0)
Protein, ur: 100 mg/dL — AB
Specific Gravity, Urine: 1.006 (ref 1.005–1.030)

## 2018-07-12 LAB — CBC
HEMATOCRIT: 44.2 % (ref 36.0–46.0)
HEMOGLOBIN: 13.6 g/dL (ref 12.0–15.0)
MCH: 30 pg (ref 26.0–34.0)
MCHC: 30.8 g/dL (ref 30.0–36.0)
MCV: 97.4 fL (ref 78.0–100.0)
Platelets: 264 10*3/uL (ref 150–400)
RBC: 4.54 MIL/uL (ref 3.87–5.11)
RDW: 14.6 % (ref 11.5–15.5)
WBC: 9.1 10*3/uL (ref 4.0–10.5)

## 2018-07-12 LAB — COMPREHENSIVE METABOLIC PANEL
ALBUMIN: 3.7 g/dL (ref 3.5–5.0)
ALT: 20 U/L (ref 0–44)
AST: 27 U/L (ref 15–41)
Alkaline Phosphatase: 69 U/L (ref 38–126)
Anion gap: 14 (ref 5–15)
BILIRUBIN TOTAL: 0.6 mg/dL (ref 0.3–1.2)
BUN: 41 mg/dL — AB (ref 6–20)
CO2: 19 mmol/L — AB (ref 22–32)
Calcium: 9.2 mg/dL (ref 8.9–10.3)
Chloride: 106 mmol/L (ref 98–111)
Creatinine, Ser: 9.02 mg/dL — ABNORMAL HIGH (ref 0.44–1.00)
GFR calc Af Amer: 5 mL/min — ABNORMAL LOW (ref >60)
GFR calc non Af Amer: 4 mL/min — ABNORMAL LOW (ref >60)
GLUCOSE: 106 mg/dL — AB (ref 70–99)
POTASSIUM: 4.3 mmol/L (ref 3.5–5.1)
Sodium: 139 mmol/L (ref 135–145)
TOTAL PROTEIN: 7.6 g/dL (ref 6.5–8.1)

## 2018-07-12 LAB — TROPONIN I: Troponin I: <0.03 ng/mL (ref <0.03)

## 2018-07-12 MED ORDER — LABETALOL HCL 200 MG PO TABS
100.0000 mg | ORAL_TABLET | Freq: Once | ORAL | Status: AC
  Administered 2018-07-12: 100 mg via ORAL
  Filled 2018-07-12: qty 1

## 2018-07-12 MED ORDER — HYDRALAZINE HCL 25 MG PO TABS
100.0000 mg | ORAL_TABLET | Freq: Once | ORAL | Status: AC
  Administered 2018-07-12: 100 mg via ORAL
  Filled 2018-07-12: qty 4

## 2018-07-12 NOTE — ED Notes (Signed)
Pt given sandwich and water per MD.

## 2018-07-12 NOTE — ED Triage Notes (Signed)
C/o chest pain onset yest. States she didn't go to dialysis on Monday she was at her [facility] is scheduled for dialysis today at 11am. C/o headache, left arm restricted.

## 2018-07-12 NOTE — Progress Notes (Signed)
CSW consulted as pt is needing transportation back home. CSW provided pt with taxi to home address at this time.    There are no further CSW needs warrant, CSW will sign off.   Jeanette Caprice S. Alaysiah Browder, MSW, [facility] Clinical Social Worker [phone]

## 2018-07-12 NOTE — Discharge Instructions (Addendum)
Please call your dialysis center for rescheduling of your dialysis to tomorrow

## 2018-07-12 NOTE — ED Notes (Signed)
Attempted x 1 and EMS attempted x 2 for IV stick unsuccessful.

## 2018-07-12 NOTE — ED Provider Notes (Signed)
[facility] EMERGENCY DEPARTMENT Provider Note   CSN: [id number] Arrival date & time: 07/12/18  0854     History   Chief Complaint Chief Complaint  Patient presents with  . Chest Pain  . Headache    HPI Tammy Moses is a 52 y.o. female.  HPI 52 year old female presents the emergency department complaints of chest discomfort which began yesterday.  It has been persistent.  She has end-stage renal disease and missed her last dialysis session on Monday.  She is scheduled for dialysis today but did not go because of the chest discomfort.  No significant shortness of breath.  Some mild headache.  Denies fevers and chills.  No productive cough.  No radiation of her anterior chest discomfort.  Is been constant since yesterday.  No meds prior to arrival were attempted.   Past Medical History:  Diagnosis Date  . Anemia   . Anxiety   . Blind right eye   . Constipation, chronic   . Dental caries   . Diabetes mellitus   . Diabetic neuropathy ([place])   . Diabetic retinopathy   . GERD (gastroesophageal reflux disease)   . Glaucoma   . H. pylori infection   . Hepatitis C carrier ([place])   . High risk sexual behavior   . Hyperlipidemia   . Hypertension   . Hypoglycemia 07/12/2017  . Insomnia   . Microalbuminuria   . Nonspecific elevation of levels of transaminase or lactic acid dehydrogenase (LDH)   . Tobacco dependence   . Vitamin D deficiency     Patient Active Problem List   Diagnosis Date Noted  . Fall   . Hypoglycemia 07/12/2017  . CKD (chronic kidney disease) stage 5, GFR less than 15 ml/min (HCC) 07/12/2017  . Anemia 07/12/2017  . Alcoholic intoxication without complication ([place])   . Foot drop 05/26/2017  . Carpal tunnel syndrome of left wrist 05/20/2017  . 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 Peripheral neuropathy 05/10/2017  . Diabetes mellitus ([place]) 05/10/2017  . Gait abnormality 03/07/2014  . Abnormality of gait 03/07/2014  .  Hepatitis C virus infection without hepatic coma 03/04/2014  . Erosive gastritis 02/14/2014  . BACK PAIN, LUMBAR 08/18/2010  . IDDM 08/11/2010  . HYPERCHOLESTEROLEMIA 08/11/2010  . HYPOKALEMIA 08/11/2010  . DEPRESSION 08/11/2010  . GLAUCOMA 08/11/2010  . BLINDNESS, RIGHT EYE 08/11/2010  . Essential hypertension 08/11/2010  . GERD 08/11/2010  . CONSTIPATION 08/06/2009    Past Surgical History:  Procedure Laterality Date  . ABDOMINAL HYSTERECTOMY  04/30/2009   PARTIAL  . COLONOSCOPY    . DIALYSIS FISTULA CREATION Left 06/2017  . ESOPHAGOGASTRODUODENOSCOPY       OB History   None      Home Medications    Prior to Admission medications   Medication Sig Start Date End Date Taking? Authorizing Provider  albuterol (PROVENTIL HFA;VENTOLIN HFA) 108 (90 Base) MCG/ACT inhaler Inhale 2 puffs into the lungs every 4 (four) hours as needed for shortness of breath. For shortness of breath 12/13/17  Yes Scot Jun, FNP  amLODipine (NORVASC) 10 MG tablet Take 10 mg by mouth daily. 07/03/18  Yes [provider]  atropine 1 % ophthalmic solution Place 1 drop into both eyes 2 (two) times daily.   Yes [provider]  buPROPion (WELLBUTRIN SR) 150 MG 12 hr tablet Take 1 tablet (150 mg total) by mouth 2 (two) times daily. 02/01/18  Yes Scot Jun, FNP  calcium acetate [facility])  667 MG capsule Take 2,001 mg by mouth 3 (three) times daily with meals. 07/06/18  Yes [provider]  furosemide (LASIX) 20 MG tablet Take 1 tablet (20 mg total) by mouth daily as needed. 01/10/18  Yes Scot Jun, FNP  furosemide (LASIX) 40 MG tablet Take 40 mg by mouth daily. 07/03/18  Yes [provider]  gabapentin (NEURONTIN) 300 MG capsule Take 2 capsules (600 mg total) by mouth 3 (three) times daily. 12/19/17  Yes Scot Jun, FNP  hydrALAZINE (APRESOLINE) 100 MG tablet Take 1 tablet (100 mg total) by mouth 2 (two) times daily. 08/22/17  Yes Scot Jun,  FNP  Insulin Glargine (LANTUS SOLOSTAR) 100 UNIT/ML Solostar Pen Inject 20 Units into the skin daily at 10 pm. 12/13/17  Yes Scot Jun, FNP  labetalol (NORMODYNE) 200 MG tablet Take 400 mg by mouth daily. 06/07/17  Yes [provider]  omeprazole (PRILOSEC) 20 MG capsule Take 1 capsule (20 mg total) by mouth daily. Has to be taken at the same time with Harvoni FASTED 12/13/17  Yes Scot Jun, FNP  pravastatin (PRAVACHOL) 80 MG tablet Take 1 tablet (80 mg total) by mouth daily. 12/13/17  Yes Scot Jun, FNP  timolol (TIMOPTIC) 0.5 % ophthalmic solution Place 1 drop into the left eye at bedtime.  05/23/17  Yes [provider]  Travoprost, BAK Free, (TRAVATAN) 0.004 % SOLN ophthalmic solution Place 1 drop into both eyes at bedtime.   Yes [provider]  blood glucose meter kit and supplies KIT Dispense based on patient and insurance preference. Use up to four times daily as directed. (FOR ICD-10 E.11.9) 11/30/17   Scot Jun, FNP  Cholecalciferol (VITAMIN D-3) 1000 units CAPS Take 2 capsules (2,000 Units total) by mouth daily. 12/13/17   Scot Jun, FNP  cloNIDine (CATAPRES - DOSED IN MG/24 HR) 0.2 mg/24hr patch Place 1 patch (0.2 mg total) onto the skin once a week. Patient not taking: Reported on 06/13/2018 03/11/17   Scot Jun, FNP  cloNIDine (CATAPRES - DOSED IN MG/24 HR) 0.2 mg/24hr patch Place 1 patch (0.2 mg total) onto the skin once a week. Patient not taking: Reported on 06/13/2018 12/13/17   Scot Jun, FNP  Gabapentin, Once-Daily, 600 MG TABS Take 600 mg by mouth 3 (three) times daily. Patient not taking: Reported on 06/13/2018 12/13/17   Scot Jun, FNP  glucose blood (ONE TOUCH ULTRA TEST) test strip USE TO TEST 3 TIMES A DAY 06/11/18   [name], FNP  Insulin Pen Needle 29G X 12MM MISC E11.9 Dx Code Use once at bedtime with each nightly insulin 03/11/17   Scot Jun, FNP  ketotifen (ZADITOR)  0.025 % ophthalmic solution Place 1 drop into both eyes 2 (two) times daily. Patient not taking: Reported on 07/12/2018 03/30/16   Janne Napoleon, NP  Lancets [facility] ULTRASOFT) lancets Use as instructed 04/30/16   Micheline Chapman, NP  sulfamethoxazole-trimethoprim (BACTRIM DS,SEPTRA DS) 800-160 MG tablet Take 1 tablet by mouth 2 (two) times daily. Patient not taking: Reported on 07/12/2018 06/13/18   [name], FNP  traZODone (DESYREL) 50 MG tablet Take 1 tablet (50 mg total) by mouth at bedtime as needed for sleep. Patient not taking: Reported on 07/12/2018 06/13/18   [name], FNP    Family History Family History  Problem Relation Age of Onset  . High blood pressure Mother   . Diabetes Mother   . Thyroid  disease Mother   . Diabetes Father   . High blood pressure Father     Social History Social History   Tobacco Use  . Smoking status: Current Every Day Smoker    Packs/day: 0.25    Types: Cigarettes  . Smokeless tobacco: Never Used  . Tobacco comment: trying to quit  Substance Use Topics  . Alcohol use: Yes    Comment: 2-3 beers weekly  . Drug use: No    Types: Marijuana, Cocaine    Comment: quit 3 months ago     Allergies   Acyclovir and related   Review of Systems Review of Systems  All other systems reviewed and are negative.    Physical Exam Updated Vital Signs BP (!) 206/109 (BP Location: Right Arm)   Pulse 78   Temp (!) 97.5 F (36.4 C) (Oral)   Resp 18   Ht 5' 1"  (1.549 m)   Wt 65.3 kg   SpO2 100%   BMI 27.21 kg/m   Physical Exam  Constitutional: She is oriented to person, place, and time. She appears well-developed and well-nourished. No distress.  HENT:  Head: Normocephalic and atraumatic.  Eyes: EOM are normal.  Neck: Normal range of motion.  Cardiovascular: Normal rate, regular rhythm and normal heart sounds.  Pulmonary/Chest: Effort normal and breath sounds normal.  Abdominal: Soft. She exhibits no distension. There is no  tenderness.  Musculoskeletal: Normal range of motion.  Neurological: She is alert and oriented to person, place, and time.  Skin: Skin is warm and dry.  Psychiatric: She has a normal mood and affect. Judgment normal.  Nursing note and vitals reviewed.    ED Treatments / Results  Labs (all labs ordered are listed, but only abnormal results are displayed) Labs Reviewed  URINALYSIS, ROUTINE W REFLEX MICROSCOPIC    EKG None  Radiology No results found.  Procedures Procedures (including critical care time)  Medications Ordered in ED Medications - No data to display   Initial Impression / Assessment and Plan / ED Course  I have reviewed the triage vital signs and the nursing notes.  Pertinent labs & imaging results that were available during my care of the patient were reviewed by me and considered in my medical decision making (see chart for details).     Overall the patient is well-appearing.  She feels much better at this time.  Blood pressure elevated and additional dose of her home meds given.  This will likely improve with dialysis tomorrow.  No indication for emergent dialysis through the emergency department today.  Potassium is within normal limits.  No signs to suggest volume overload on her chest x-ray.  Stable condition for discharge.  Patient encouraged to return to the ER for new or worsening symptoms  Final Clinical Impressions(s) / ED Diagnoses   Final diagnoses:  None    ED Discharge Orders    None       Jola Schmidt, MD 07/12/18 1325

## 2018-07-22 ENCOUNTER — Emergency Department (HOSPITAL_COMMUNITY)
Admission: EM | Admit: 2018-07-22 | Discharge: 2018-07-23 | Disposition: A | Discharge status: Home / Self Care | Payer: Medicare Other | Attending: Emergency Medicine | Admitting: Emergency Medicine

## 2018-07-22 ENCOUNTER — Other Ambulatory Visit: Payer: Self-pay

## 2018-07-22 ENCOUNTER — Encounter (HOSPITAL_COMMUNITY): Payer: Self-pay

## 2018-07-22 ENCOUNTER — Emergency Department (HOSPITAL_COMMUNITY): Payer: Medicare Other

## 2018-07-22 DIAGNOSIS — E1122 Type 2 diabetes mellitus with diabetic chronic kidney disease: Secondary | ICD-10-CM | POA: Diagnosis not present

## 2018-07-22 DIAGNOSIS — I728 Aneurysm of other specified arteries: Secondary | ICD-10-CM | POA: Diagnosis not present

## 2018-07-22 DIAGNOSIS — I120 Hypertensive chronic kidney disease with stage 5 chronic kidney disease or end stage renal disease: Secondary | ICD-10-CM | POA: Diagnosis not present

## 2018-07-22 DIAGNOSIS — Z79899 Other long term (current) drug therapy: Secondary | ICD-10-CM | POA: Insufficient documentation

## 2018-07-22 DIAGNOSIS — R1030 Lower abdominal pain, unspecified: Secondary | ICD-10-CM | POA: Diagnosis present

## 2018-07-22 DIAGNOSIS — F419 Anxiety disorder, unspecified: Secondary | ICD-10-CM | POA: Insufficient documentation

## 2018-07-22 DIAGNOSIS — N185 Chronic kidney disease, stage 5: Secondary | ICD-10-CM | POA: Insufficient documentation

## 2018-07-22 DIAGNOSIS — F329 Major depressive disorder, single episode, unspecified: Secondary | ICD-10-CM | POA: Diagnosis not present

## 2018-07-22 DIAGNOSIS — Z794 Long term (current) use of insulin: Secondary | ICD-10-CM | POA: Insufficient documentation

## 2018-07-22 DIAGNOSIS — F17210 Nicotine dependence, cigarettes, uncomplicated: Secondary | ICD-10-CM | POA: Insufficient documentation

## 2018-07-22 LAB — CBC WITH DIFFERENTIAL/PLATELET
Basophils Absolute: 0 10*3/uL (ref 0.0–0.1)
Basophils Relative: 1 %
Eosinophils Absolute: 0.3 10*3/uL (ref 0.0–0.7)
Eosinophils Relative: 4 %
HCT: 39 % (ref 36.0–46.0)
HEMOGLOBIN: 12.6 g/dL (ref 12.0–15.0)
LYMPHS ABS: 3.7 10*3/uL (ref 0.7–4.0)
Lymphocytes Relative: 44 %
MCH: 30.3 pg (ref 26.0–34.0)
MCHC: 32.3 g/dL (ref 30.0–36.0)
MCV: 93.8 fL (ref 78.0–100.0)
MONOS PCT: 7 %
Monocytes Absolute: 0.6 10*3/uL (ref 0.1–1.0)
NEUTROS PCT: 44 %
Neutro Abs: 3.8 10*3/uL (ref 1.7–7.7)
Platelets: 252 10*3/uL (ref 150–400)
RBC: 4.16 MIL/uL (ref 3.87–5.11)
RDW: 15.5 % (ref 11.5–15.5)
WBC: 8.5 10*3/uL (ref 4.0–10.5)

## 2018-07-22 LAB — BASIC METABOLIC PANEL
Anion gap: 13 (ref 5–15)
BUN: 34 mg/dL — AB (ref 6–20)
CHLORIDE: 100 mmol/L (ref 98–111)
CO2: 29 mmol/L (ref 22–32)
CREATININE: 6.42 mg/dL — AB (ref 0.44–1.00)
Calcium: 8.8 mg/dL — ABNORMAL LOW (ref 8.9–10.3)
GFR, EST AFRICAN AMERICAN: 8 mL/min — AB (ref >60)
GFR, EST NON AFRICAN AMERICAN: 7 mL/min — AB (ref >60)
Glucose, Bld: 150 mg/dL — ABNORMAL HIGH (ref 70–99)
POTASSIUM: 3.5 mmol/L (ref 3.5–5.1)
Sodium: 142 mmol/L (ref 135–145)

## 2018-07-22 MED ORDER — IOPAMIDOL (ISOVUE-370) INJECTION 76%
INTRAVENOUS | Status: AC
  Filled 2018-07-22: qty 100

## 2018-07-22 MED ORDER — IOPAMIDOL (ISOVUE-370) INJECTION 76%
100.0000 mL | Freq: Once | INTRAVENOUS | Status: AC | PRN
  Administered 2018-07-22: 100 mL via INTRAVENOUS

## 2018-07-22 MED ORDER — ACETAMINOPHEN 325 MG PO TABS
650.0000 mg | ORAL_TABLET | Freq: Once | ORAL | Status: AC
  Administered 2018-07-22: 650 mg via ORAL
  Filled 2018-07-22: qty 2

## 2018-07-22 NOTE — ED Provider Notes (Signed)
[facility] DEPT Provider Note   CSN: [id number] Arrival date & time: 07/22/18  1650     History   Chief Complaint Chief Complaint  Patient presents with  . Fall    HPI MAHOGANIE BASHER is a 52 y.o. female with hx of HTN, Hep C, CKD with dialysis, ETOH abuse, DM, Anemia and peripheral neuropathy who presents to the ED s/p fall. Pt arrived via [facility]. Pt is AOx4 and ambulatory with walker. Pt called 911 due to slipping in bathroom at [employer] station. Pt fell on toilet and tile floor from standing height, denies hitting head and LOC, chief complaint is low back pain / flank pain from falling. No hip, spine or neck pain, was able to stand and sit on stretcher by self per EMS. she is able to stand and sit in wheelchair by self currently.   HPI  Past Medical History:  Diagnosis Date  . Anemia   . Anxiety   . Blind right eye   . Constipation, chronic   . Dental caries   . Diabetes mellitus   . Diabetic neuropathy ([place])   . Diabetic retinopathy   . GERD (gastroesophageal reflux disease)   . Glaucoma   . H. pylori infection   . Hepatitis C carrier ([place])   . High risk sexual behavior   . Hyperlipidemia   . Hypertension   . Hypoglycemia 07/12/2017  . Insomnia   . Microalbuminuria   . Nonspecific elevation of levels of transaminase or lactic acid dehydrogenase (LDH)   . Tobacco dependence   . Vitamin D deficiency     Patient Active Problem List   Diagnosis Date Noted  . Fall   . Hypoglycemia 07/12/2017  . CKD (chronic kidney disease) stage 5, GFR less than 15 ml/min (HCC) 07/12/2017  . Anemia 07/12/2017  . Alcoholic intoxication without complication ([place])   . Foot drop 05/26/2017  . Carpal tunnel syndrome of left wrist 05/20/2017  . 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 Peripheral neuropathy 05/10/2017  . Diabetes mellitus ([place]) 05/10/2017  . Gait abnormality 03/07/2014  . Abnormality of gait 03/07/2014  . Hepatitis C virus  infection without hepatic coma 03/04/2014  . Erosive gastritis 02/14/2014  . BACK PAIN, LUMBAR 08/18/2010  . IDDM 08/11/2010  . HYPERCHOLESTEROLEMIA 08/11/2010  . HYPOKALEMIA 08/11/2010  . DEPRESSION 08/11/2010  . GLAUCOMA 08/11/2010  . BLINDNESS, RIGHT EYE 08/11/2010  . Essential hypertension 08/11/2010  . GERD 08/11/2010  . CONSTIPATION 08/06/2009    Past Surgical History:  Procedure Laterality Date  . ABDOMINAL HYSTERECTOMY  04/30/2009   PARTIAL  . COLONOSCOPY    . DIALYSIS FISTULA CREATION Left 06/2017  . ESOPHAGOGASTRODUODENOSCOPY       OB History   None      Home Medications    Prior to Admission medications   Medication Sig Start Date End Date Taking? Authorizing Provider  albuterol (PROVENTIL HFA;VENTOLIN HFA) 108 (90 Base) MCG/ACT inhaler Inhale 2 puffs into the lungs every 4 (four) hours as needed for shortness of breath. For shortness of breath 12/13/17   Scot Jun, FNP  amLODipine (NORVASC) 10 MG tablet Take 10 mg by mouth daily. 07/03/18   [provider]  atropine 1 % ophthalmic solution Place 1 drop into both eyes 2 (two) times daily.    [provider]  blood glucose meter kit and supplies KIT Dispense based on patient and insurance preference. Use up to four times daily as directed. (FOR ICD-10  E.11.9) 11/30/17   Scot Jun, FNP  buPROPion (WELLBUTRIN SR) 150 MG 12 hr tablet Take 1 tablet (150 mg total) by mouth 2 (two) times daily. 02/01/18   Scot Jun, FNP  calcium acetate (PHOSLO) 667 MG capsule Take 2,001 mg by mouth 3 (three) times daily with meals. 07/06/18   [provider]  Cholecalciferol (VITAMIN D-3) 1000 units CAPS Take 2 capsules (2,000 Units total) by mouth daily. 12/13/17   Scot Jun, FNP  cloNIDine (CATAPRES - DOSED IN MG/24 HR) 0.2 mg/24hr patch Place 1 patch (0.2 mg total) onto the skin once a week. Patient not taking: Reported on 06/13/2018 03/11/17   Scot Jun, FNP  cloNIDine  (CATAPRES - DOSED IN MG/24 HR) 0.2 mg/24hr patch Place 1 patch (0.2 mg total) onto the skin once a week. Patient not taking: Reported on 06/13/2018 12/13/17   Scot Jun, FNP  furosemide (LASIX) 20 MG tablet Take 1 tablet (20 mg total) by mouth daily as needed. 01/10/18   Scot Jun, FNP  furosemide (LASIX) 40 MG tablet Take 40 mg by mouth daily. 07/03/18   [provider]  gabapentin (NEURONTIN) 300 MG capsule Take 2 capsules (600 mg total) by mouth 3 (three) times daily. 12/19/17   Scot Jun, FNP  Gabapentin, Once-Daily, 600 MG TABS Take 600 mg by mouth 3 (three) times daily. Patient not taking: Reported on 06/13/2018 12/13/17   Scot Jun, FNP  glucose blood (ONE TOUCH ULTRA TEST) test strip USE TO TEST 3 TIMES A DAY 06/11/18   [name], FNP  hydrALAZINE (APRESOLINE) 100 MG tablet Take 1 tablet (100 mg total) by mouth 2 (two) times daily. 08/22/17   Scot Jun, FNP  Insulin Glargine (LANTUS SOLOSTAR) 100 UNIT/ML Solostar Pen Inject 20 Units into the skin daily at 10 pm. 12/13/17   Scot Jun, FNP  Insulin Pen Needle 29G X 12MM MISC E11.9 Dx Code Use once at bedtime with each nightly insulin 03/11/17   Scot Jun, FNP  ketotifen (ZADITOR) 0.025 % ophthalmic solution Place 1 drop into both eyes 2 (two) times daily. Patient not taking: Reported on 07/12/2018 03/30/16   Janne Napoleon, NP  labetalol (NORMODYNE) 200 MG tablet Take 400 mg by mouth daily. 06/07/17   [provider]  Lancets [facility] ULTRASOFT) lancets Use as instructed 04/30/16   Micheline Chapman, NP  omeprazole (PRILOSEC) 20 MG capsule Take 1 capsule (20 mg total) by mouth daily. Has to be taken at the same time with Harvoni FASTED 12/13/17   Scot Jun, FNP  pravastatin (PRAVACHOL) 80 MG tablet Take 1 tablet (80 mg total) by mouth daily. 12/13/17   Scot Jun, FNP  sulfamethoxazole-trimethoprim (BACTRIM DS,SEPTRA DS) 800-160 MG tablet Take 1 tablet by  mouth 2 (two) times daily. Patient not taking: Reported on 07/12/2018 06/13/18   [name], FNP  timolol (TIMOPTIC) 0.5 % ophthalmic solution Place 1 drop into the left eye at bedtime.  05/23/17   [provider]  Travoprost, BAK Free, (TRAVATAN) 0.004 % SOLN ophthalmic solution Place 1 drop into both eyes at bedtime.    [provider]  traZODone (DESYREL) 50 MG tablet Take 1 tablet (50 mg total) by mouth at bedtime as needed for sleep. Patient not taking: Reported on 07/12/2018 06/13/18   [name], FNP    Family History Family History  Problem Relation Age of Onset  . High blood pressure Mother   .  Diabetes Mother   . Thyroid disease Mother   . Diabetes Father   . High blood pressure Father     Social History Social History   Tobacco Use  . Smoking status: Current Every Day Smoker    Packs/day: 0.25    Types: Cigarettes  . Smokeless tobacco: Never Used  . Tobacco comment: trying to quit  Substance Use Topics  . Alcohol use: Yes    Comment: 2-3 beers weekly  . Drug use: No    Types: Marijuana, Cocaine    Comment: quit 3 months ago     Allergies   Acyclovir and related   Review of Systems Review of Systems  Constitutional: Negative for diaphoresis.  HENT: Negative.   Eyes:       Blind right eye  Respiratory: Negative for cough.   Cardiovascular: Negative for chest pain.  Gastrointestinal: Negative for abdominal pain and vomiting.  Musculoskeletal: Positive for back pain.  Skin: Negative for wound.  Neurological: Negative for headaches.     Physical Exam Updated Vital Signs BP (!) 147/87 (BP Location: Right Arm)   Pulse 84   Temp 98.2 F (36.8 C) (Oral)   Resp 19   Ht 5' 1"  (1.549 m)   Wt 65.1 kg   SpO2 96%   BMI 27.12 kg/m   Physical Exam  Constitutional: She appears well-developed and well-nourished. No distress.  HENT:  Head: Normocephalic.  Eyes:  Right eye opaque, patient blind in right eye.  Neck: Neck  supple.  Cardiovascular: Normal rate and regular rhythm.  Pulmonary/Chest: Effort normal. No respiratory distress. She has no wheezes. She has no rales.  Abdominal: Soft. There is no tenderness.  Musculoskeletal:       Lumbar back: She exhibits tenderness. She exhibits normal pulse.       Back:  Patient able to get out of her chair and ambulate to the bathroom using her walker.   Neurological: She is alert.  Skin: Skin is warm and dry.  Nursing note and vitals reviewed.    ED Treatments / Results  Labs (all labs ordered are listed, but only abnormal results are displayed) Labs Reviewed  BASIC METABOLIC PANEL - Abnormal; Notable for the following components:      Result Value   Glucose, Bld 150 (*)    BUN 34 (*)    Creatinine, Ser 6.42 (*)    Calcium 8.8 (*)    GFR calc non Af Amer 7 (*)    GFR calc Af Amer 8 (*)    All other components within normal limits  CBC WITH DIFFERENTIAL/PLATELET    Radiology Dg Lumbar Spine Complete  Result Date: 07/22/2018 CLINICAL DATA:  Fall with pain to the lumbar spine EXAM: LUMBAR SPINE - COMPLETE 4+ VIEW COMPARISON:  MRI 05/30/2017 FINDINGS: Lumbar alignment within normal limits. Vertebral body heights are normal. Disc spaces are within normal limits. Aortic atherosclerosis. 16 mm round calcification in the left upper quadrant likely reflects vascular aneurysm. IMPRESSION: 1. No acute osseous abnormality. 2. 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 Electronically Signed   By: Donavan Foil M.D.   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 I have discussed  this case with Dr. NKathrynn Humbleand he will assume care of the patient and f/u on CT scan and make disposition.  Initial Impression / Assessment and Plan / ED Course  I have reviewed the triage vital signs and the nursing notes.   Final Clinical Impressions(s) / ED Diagnoses   ED Discharge Orders    None       NDebroah BallerMLoma Rica NWisconsin09/28/19 2229    NVarney Biles MD 07/22/18 [phone]

## 2018-07-22 NOTE — ED Notes (Signed)
x1 unsuccessful IV attempt by Adella Hare. Charge nurse asked to attempt ultrasound IV.

## 2018-07-22 NOTE — ED Notes (Signed)
Bed: PV66 Expected date:  Expected time:  Means of arrival:  Comments: Triage 7

## 2018-07-22 NOTE — Discharge Instructions (Addendum)
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 Fortunately, it appears to be stable.  However it is prudent to follow-up with the hematologist at the cancer center to make sure that you do not end up with any complication.

## 2018-07-22 NOTE — ED Notes (Signed)
Pt is currently in restroom providing urine sample and changing into clean paper scrubs and socks.

## 2018-07-22 NOTE — ED Triage Notes (Signed)
Pt arrived via [facility]. Pt is AOx4 and ambulatory with walker. Pt called 911 due to slipping in bathroom at [employer] station. Pt fell on toilet and tile floor from standing height, denies hitting head and LOC, chief complaint is mild back pain / flank pain from falling. No hip, spine or neck pain, was able to stand and sit on stretcher by self. Is able to stand and sit in wheelchair by self currently in [place].

## 2018-07-25 ENCOUNTER — Ambulatory Visit (INDEPENDENT_AMBULATORY_CARE_PROVIDER_SITE_OTHER): Payer: Medicare Other | Admitting: Podiatry

## 2018-07-25 ENCOUNTER — Encounter

## 2018-07-25 ENCOUNTER — Encounter: Payer: Self-pay | Admitting: Podiatry

## 2018-07-25 VITALS — BP 147/86 | HR 97

## 2018-07-25 DIAGNOSIS — B351 Tinea unguium: Secondary | ICD-10-CM

## 2018-07-25 DIAGNOSIS — E1142 Type 2 diabetes mellitus with diabetic polyneuropathy: Secondary | ICD-10-CM

## 2018-07-25 DIAGNOSIS — M79675 Pain in left toe(s): Secondary | ICD-10-CM | POA: Diagnosis not present

## 2018-07-25 DIAGNOSIS — M79674 Pain in right toe(s): Secondary | ICD-10-CM

## 2018-07-25 DIAGNOSIS — E1159 Type 2 diabetes mellitus with other circulatory complications: Secondary | ICD-10-CM | POA: Diagnosis not present

## 2018-07-25 NOTE — Progress Notes (Signed)
This patient presents to the office with chief complaint of long thick nails and diabetic feet.  This patient  says there  is  no pain and discomfort in her  feet.  This patient says there are long thick painful nails.  These nails are painful walking and wearing shoes. She states she has eye problems. Patient has no history of infection or drainage from both feet.  Patient is unable to  self treat his own nails . This patient presents  to the office today for treatment of the  long nails and a foot evaluation due to history of  diabetes.  General Appearance  Alert, conversant and in no acute stress.  Vascular  Dorsalis pedis and posterior tibial  pulses are weakly  palpable  bilaterally.  Capillary return is within normal limits  bilaterally. Temperature is within normal limits  bilaterally.  Neurologic  Senn-Weinstein monofilament wire test diminished   bilaterally. Muscle power within normal limits bilaterally.  Nails Thick disfigured discolored nails with subungual debris  from hallux to fifth toes bilaterally. No evidence of bacterial infection or drainage bilaterally.  Orthopedic  No limitations of motion of motion feet .  No crepitus or effusions noted.  HAV  B/L  Skin  normotropic skin with no porokeratosis noted bilaterally.  No signs of infections or ulcers noted.     Onychomycosis  Diabetes with angiopathy and neuropathy.  IE  Debride nails x 10.  A diabetic foot exam was performed and there is is  evidence of  vascular and  neurologic pathology.   RTC 3 months.   Gardiner Barefoot DPM

## 2018-12-12 ENCOUNTER — Ambulatory Visit: Payer: Medicare Other | Admitting: Family Medicine

## 2019-01-04 ENCOUNTER — Ambulatory Visit (INDEPENDENT_AMBULATORY_CARE_PROVIDER_SITE_OTHER): Payer: Medicare Other | Admitting: Podiatry

## 2019-01-04 ENCOUNTER — Other Ambulatory Visit: Payer: Self-pay

## 2019-01-04 ENCOUNTER — Encounter: Payer: Self-pay | Admitting: Podiatry

## 2019-01-04 DIAGNOSIS — E1142 Type 2 diabetes mellitus with diabetic polyneuropathy: Secondary | ICD-10-CM | POA: Diagnosis not present

## 2019-01-04 DIAGNOSIS — Z7409 Other reduced mobility: Secondary | ICD-10-CM | POA: Insufficient documentation

## 2019-01-04 DIAGNOSIS — M79674 Pain in right toe(s): Secondary | ICD-10-CM | POA: Diagnosis not present

## 2019-01-04 DIAGNOSIS — M79675 Pain in left toe(s): Secondary | ICD-10-CM

## 2019-01-04 DIAGNOSIS — B351 Tinea unguium: Secondary | ICD-10-CM

## 2019-01-04 DIAGNOSIS — L84 Corns and callosities: Secondary | ICD-10-CM

## 2019-01-04 NOTE — Patient Instructions (Signed)
Diabetic Neuropathy Diabetic neuropathy refers to nerve damage that is caused by diabetes (diabetes mellitus). Over time, people with diabetes can develop nerve damage throughout the body. There are several types of diabetic neuropathy:  Peripheral neuropathy. This is the most common type of diabetic neuropathy. It causes damage to nerves that carry signals between the spinal cord and other parts of the body (peripheral nerves). This usually affects nerves in the feet and legs first, and may eventually affect the hands and arms. The damage affects the ability to sense touch or temperature.  Autonomic neuropathy. This type causes damage to nerves that control involuntary functions (autonomic nerves). These nerves carry signals that control: ? Heartbeat. ? Body temperature. ? Blood pressure. ? Urination. ? Digestion. ? Sweating. ? Sexual function. ? Response to changing blood sugar (glucose) levels.  Focal neuropathy. This type of nerve damage affects one area of the body, such as an arm, a leg, or the face. The injury may involve one nerve or a small group of nerves. Focal neuropathy can be painful and unpredictable, and occurs most often in older adults with diabetes. This often develops suddenly, but usually improves over time and does not cause long-term problems.  Proximal neuropathy. This type of nerve damage affects the nerves of the thighs, hips, buttocks, or legs. It causes severe pain, weakness, and muscle death (atrophy), usually in the thigh muscles. It is more common among older men and people who have type 2 diabetes. The length of recovery time may vary. What are the causes? Peripheral, autonomic, and focal neuropathies are caused by diabetes that is not well controlled with treatment. The cause of proximal neuropathy is not known, but it may be caused by inflammation related to uncontrolled blood glucose levels. What are the signs or symptoms? Peripheral neuropathy Peripheral  neuropathy develops slowly over time. When the nerves of the feet and legs no longer work, you may experience:  Burning, stabbing, or aching pain in the legs or feet.  Pain or cramping in the legs or feet.  Loss of feeling (numbness) and inability to feel pressure or pain in the feet. This can lead to: ? Thick calluses or sores on areas of constant pressure. ? Ulcers. ? Reduced ability to feel temperature changes.  Foot deformities.  Muscle weakness.  Loss of balance or coordination. Autonomic neuropathy The symptoms of autonomic neuropathy vary depending on which nerves are affected. Symptoms may include:  Problems with digestion, such as: ? Nausea or vomiting. ? Poor appetite. ? Bloating. ? Diarrhea or constipation. ? Trouble swallowing. ? Losing weight without trying to.  Problems with the heart, blood and lungs, such as: ? Dizziness, especially when standing up. ? Fainting. ? Shortness of breath. ? Irregular heartbeat.  Bladder problems, such as: ? Trouble starting or stopping urination. ? Leaking urine. ? Trouble emptying the bladder. ? Urinary tract infections (UTIs).  Problems with other body functions, such as: ? Sweat. You may sweat too much or too little. ? Temperature. You might get hot easily. Or, you might feel cold more than usual. ? Sexual function. Men may not be able to get or maintain an erection. Women may have vaginal dryness and difficulty with arousal. Focal neuropathy Symptoms affect only one area of the body. Common symptoms include:  Numbness.  Tingling.  Burning pain.  Prickling feeling.  Very sensitive skin.  Weakness.  Inability to move (paralysis).  Muscle twitching.  Muscles getting smaller (wasting).  Poor coordination.  Double or blurred vision. Proximal   neuropathy  Sudden, severe pain in the hip, thigh, or buttocks. Pain may spread from the back into the legs (sciatica).  Pain and numbness in the arms and legs.   Tingling.  Loss of bladder control or bowel control.  Weakness and wasting of thigh muscles.  Difficulty getting up from a seated position.  Abdominal swelling.  Unexplained weight loss. How is this diagnosed? Diagnosis usually involves reviewing your medical history and any symptoms you have. Diagnosis varies depending on the type of neuropathy your health care provider suspects. Peripheral neuropathy Your health care provider will check areas that are affected by your nervous system (neurologic exam), such as your reflexes, how you move, and what you can feel. You may have other tests, such as:  Blood tests.  Removal and examination of fluid that surrounds the spinal cord (lumbar puncture).  CT scan.  MRI.  A test to check the nerves that control muscles (electromyogram, EMG).  Tests of how quickly messages pass through your nerves (nerve conduction velocity tests).  Removal of a small piece of nerve to be examined under a microscope (biopsy). Autonomic neuropathy You may have tests, such as:  Tests to measure your blood pressure and heart rate. This may include monitoring you while you are safely secured to an exam table that moves you from a lying position to an upright position (table tilt test).  Breathing tests to check your lungs.  Tests to check how food moves through the digestive system (gastric emptying tests).  Blood, sweat, or urine tests.  Ultrasound of your bladder.  Spinal fluid tests. Focal neuropathy This condition may be diagnosed with:  A neurologic exam.  CT scan.  MRI.  EMG.  Nerve conduction velocity tests. Proximal neuropathy There is no test to diagnose this type of neuropathy. You may have tests to rule out other possible causes of this type of neuropathy. Tests may include:  X-rays of your spine and lumbar region.  Lumbar puncture.  MRI. How is this treated? The goal of treatment is to keep nerve damage from getting worse.  The most important part of treatment is keeping your blood glucose level and your A1C level within your target range by following your diabetes management plan. Over time, maintaining lower blood glucose levels helps lessen symptoms. In some cases, you may need prescription pain medicine. Follow these instructions at home:  Lifestyle   Do not use any products that contain nicotine or tobacco, such as cigarettes and e-cigarettes. If you need help quitting, ask your health care provider.  Be physically active every day. Include strength training and balance exercises.  Follow a healthy meal plan.  Work with your health care provider to manage your blood pressure. General instructions  Follow your diabetes management plan as directed. ? Check your blood glucose levels as directed by your health care provider. ? Keep your blood glucose in your target range as directed by your health care provider. ? Have your A1C level checked at least two times a year, or as often as told by your health care provider.  Take over the counter and prescription medicines only as told by your health care provider. This includes insulin and diabetes medicine.  Do not drive or use heavy machinery while taking prescription pain medicines.  Check your skin and feet every day for cuts, bruises, redness, blisters, or sores.  Keep all follow up visits as told by your health care provider. This is important. Contact a health care provider if:    You have burning, stabbing, or aching pain in your legs or feet.  You are unable to feel pressure or pain in your feet.  You develop problems with digestion, such as: ? Nausea. ? Vomiting. ? Bloating. ? Constipation. ? Diarrhea. ? Abdominal pain.  You have difficulty with urination, such as inability: ? To control when you urinate (incontinence). ? To completely empty the bladder (retention).  You have palpitations.  You feel dizzy, weak, or faint when you stand  up. Get help right away if:  You cannot urinate.  You have sudden weakness or loss of coordination.  You have trouble speaking.  You have pain or pressure in your chest.  You have an irregular heart beat.  You have sudden inability to move a part of your body. Summary  Diabetic neuropathy refers to nerve damage that is caused by diabetes. It can affect nerves throughout the entire body, causing numbness and pain in the arms, legs, digestive tract, heart, and other body systems.  Keep your blood glucose level and your blood pressure in your target range, as directed by your health care provider. This can help prevent neuropathy from getting worse.  Check your skin and feet every day for cuts, bruises, redness, blisters, or sores.  Do not use any products that contain nicotine or tobacco, such as cigarettes and e-cigarettes. If you need help quitting, ask your health care provider. This information is not intended to replace advice given to you by your health care provider. Make sure you discuss any questions you have with your health care provider. Document Released: 12/20/2001 Document Revised: 11/23/2017 Document Reviewed: 11/15/2016 [employer] Interactive Patient Education  2019 [employer]  Onychomycosis/Fungal Toenails  WHAT IS IT? An infection that lies within the keratin of your nail plate that is caused by a fungus.  WHY ME? Fungal infections affect all ages, sexes, races, and creeds.  There may be many factors that predispose you to a fungal infection such as age, coexisting medical conditions such as diabetes, or an autoimmune disease; stress, medications, fatigue, genetics, etc.  Bottom line: fungus thrives in a warm, moist environment and your shoes offer such a location.  IS IT CONTAGIOUS? Theoretically, yes.  You do not want to share shoes, nail clippers or files with someone who has fungal toenails.  Walking around barefoot in the same room or sleeping in the same bed  is unlikely to transfer the organism.  It is important to realize, however, that fungus can spread easily from one nail to the next on the same foot.  HOW DO WE TREAT THIS?  There are several ways to treat this condition.  Treatment may depend on many factors such as age, medications, pregnancy, liver and kidney conditions, etc.  It is best to ask your doctor which options are available to you.  1. No treatment.   Unlike many other medical concerns, you can live with this condition.  However for many people this can be a painful condition and may lead to ingrown toenails or a bacterial infection.  It is recommended that you keep the nails cut short to help reduce the amount of fungal nail. 2. Topical treatment.  These range from herbal remedies to prescription strength nail lacquers.  About 40-50% effective, topicals require twice daily application for approximately 9 to 12 months or until an entirely new nail has grown out.  The most effective topicals are medical grade medications available through physicians offices. 3. Oral antifungal medications.  With an 80-90%  cure rate, the most common oral medication requires 3 to 4 months of therapy and stays in your system for a year as the new nail grows out.  Oral antifungal medications do require blood work to make sure it is a safe drug for you.  A liver function panel will be performed prior to starting the medication and after the first month of treatment.  It is important to have the blood work performed to avoid any harmful side effects.  In general, this medication safe but blood work is required. 4. Laser Therapy.  This treatment is performed by applying a specialized laser to the affected nail plate.  This therapy is noninvasive, fast, and non-painful.  It is not covered by insurance and is therefore, out of pocket.  The results have been very good with a 80-95% cure rate.  [facility] is the only practice in the area to offer this therapy. 5.  Permanent Nail Avulsion.  Removing the entire nail so that a new nail will not grow back.

## 2019-01-14 NOTE — Progress Notes (Signed)
Subjective: Tammy Moses presents with diabetes, diabetic neuropathy and cc of painful, discolored, thick toenails and painful calluses left foot which interfere with activities of daily living. Pain is aggravated when wearing enclosed shoe gear. Pain is relieved with periodic professional debridement.  Patient states she has lesion on plantar aspect of her left foot from a childhood injury in which she stepped on a broken soda bottle.  [name], FNP is her PCP.    Current Outpatient Medications:  .  albuterol (PROVENTIL HFA;VENTOLIN HFA) 108 (90 Base) MCG/ACT inhaler, Inhale 2 puffs into the lungs every 4 (four) hours as needed for shortness of breath. For shortness of breath, Disp: 1 Inhaler, Rfl: 3 .  amLODipine (NORVASC) 10 MG tablet, Take 10 mg by mouth daily., Disp: , Rfl: 3 .  atropine 1 % ophthalmic solution, Place 1 drop into both eyes 2 (two) times daily., Disp: , Rfl:  .  blood glucose meter kit and supplies KIT, Dispense based on patient and insurance preference. Use up to four times daily as directed. (FOR ICD-10 E.11.9), Disp: 1 each, Rfl: 0 .  buPROPion (WELLBUTRIN SR) 150 MG 12 hr tablet, Take 1 tablet (150 mg total) by mouth 2 (two) times daily., Disp: 60 tablet, Rfl: 2 .  calcium acetate (PHOSLO) 667 MG capsule, Take 2,001 mg by mouth 3 (three) times daily with meals., Disp: , Rfl:  .  Cholecalciferol (VITAMIN D-3) 1000 units CAPS, Take 2 capsules (2,000 Units total) by mouth daily., Disp: 90 capsule, Rfl: 3 .  cloNIDine (CATAPRES - DOSED IN MG/24 HR) 0.2 mg/24hr patch, Place 1 patch (0.2 mg total) onto the skin once a week. (Patient not taking: Reported on 06/13/2018), Disp: 4 patch, Rfl: 12 .  furosemide (LASIX) 20 MG tablet, Take 1 tablet (20 mg total) by mouth daily as needed., Disp: 30 tablet, Rfl: 1 .  furosemide (LASIX) 40 MG tablet, Take 40 mg by mouth daily., Disp: , Rfl: 3 .  gabapentin (NEURONTIN) 300 MG capsule, Take 2 capsules (600 mg total) by mouth 3  (three) times daily., Disp: 180 capsule, Rfl: 3 .  gabapentin (NEURONTIN) 600 MG tablet, TK 1 T PO BID, Disp: , Rfl:  .  Gabapentin, Once-Daily, 600 MG TABS, Take 600 mg by mouth 3 (three) times daily. (Patient not taking: Reported on 06/13/2018), Disp: 180 tablet, Rfl: 6 .  glucose blood (ONE TOUCH ULTRA TEST) test strip, USE TO TEST 3 TIMES A DAY, Disp: 100 each, Rfl: 11 .  hydrALAZINE (APRESOLINE) 100 MG tablet, Take 1 tablet (100 mg total) by mouth 2 (two) times daily., Disp: 60 tablet, Rfl: 0 .  Insulin Glargine (LANTUS SOLOSTAR) 100 UNIT/ML Solostar Pen, Inject 20 Units into the skin daily at 10 pm., Disp: 5 pen, Rfl: PRN .  Insulin Pen Needle 29G X 12MM MISC, E11.9 Dx Code Use once at bedtime with each nightly insulin, Disp: 100 each, Rfl: 3 .  ketotifen (ZADITOR) 0.025 % ophthalmic solution, Place 1 drop into both eyes 2 (two) times daily. (Patient not taking: Reported on 07/12/2018), Disp: 5 mL, Rfl: 0 .  labetalol (NORMODYNE) 200 MG tablet, Take 400 mg by mouth daily., Disp: , Rfl: 1 .  Lancets (ONETOUCH ULTRASOFT) lancets, Use as instructed, Disp: 100 each, Rfl: 12 .  omeprazole (PRILOSEC) 20 MG capsule, Take 1 capsule (20 mg total) by mouth daily. Has to be taken at the same time with Harvoni FASTED, Disp: 30 capsule, Rfl: 11 .  pravastatin (PRAVACHOL) 80 MG tablet,  Take 1 tablet (80 mg total) by mouth daily., Disp: 15 tablet, Rfl: 0 .  sulfamethoxazole-trimethoprim (BACTRIM DS,SEPTRA DS) 800-160 MG tablet, Take 1 tablet by mouth 2 (two) times daily. (Patient not taking: Reported on 07/12/2018), Disp: 20 tablet, Rfl: 0 .  timolol (TIMOPTIC) 0.5 % ophthalmic solution, Place 1 drop into the left eye at bedtime. , Disp: , Rfl: 11 .  Travoprost, BAK Free, (TRAVATAN) 0.004 % SOLN ophthalmic solution, Place 1 drop into both eyes at bedtime., Disp: , Rfl:  .  traZODone (DESYREL) 50 MG tablet, Take 1 tablet (50 mg total) by mouth at bedtime as needed for sleep. (Patient not taking: Reported on  07/12/2018), Disp: 30 tablet, Rfl: 3  Allergies  Allergen Reactions  . Acyclovir And Related Itching    Vascular Examination: Capillary refill time <3 seconds  x 10 digits.  Dorsalis pedis pulses 1/4 b/l  Posterior tibial pulses 1/4 b/l  Dgital hair decreased x 10 digits.  Skin temperature gradient  WNL b/l.  Dermatological Examination: Skin with normal turgor, texture and tone b/l.  Toenails 1-5 b/l discolored, thick, dystrophic with subungual debris and pain with palpation to nailbeds due to thickness of nails.  Hyperkeratotic lesion plantar arch area x 2 . No erythema, no edema, no drainage, no flocculence noted.   Musculoskeletal: Muscle strength 5/5 to all LE muscle groups  Neurological: Sensation diminished with 10 gram monofilament.  Assessment: 1. Painful onychomycosis toenails 1-5 b/l 2. Calluses left arch x 2 3. NIDDM with Diabetic neuropathy  Plan: 1. Continue diabetic foot care principles. Literature dispensed on today. 2. Toenails 1-5 b/l were debrided in length and girth without iatrogenic bleeding. 3. Calluses pared plantar arch area x 2 utilizing sterile scalpel blade without incident.  4. Patient to continue soft, supportive shoe gear. 5. Patient to report any pedal injuries to medical professional.  6. Follow up 3 months.  7. Patient/POA to call should there be a concern in the interim.

## 2019-01-16 ENCOUNTER — Encounter: Payer: Self-pay | Admitting: Family Medicine

## 2019-01-16 ENCOUNTER — Ambulatory Visit (INDEPENDENT_AMBULATORY_CARE_PROVIDER_SITE_OTHER): Payer: Medicare Other | Admitting: Family Medicine

## 2019-01-16 ENCOUNTER — Other Ambulatory Visit: Payer: Self-pay

## 2019-01-16 VITALS — BP 150/80 | HR 90 | Temp 98.1°F | Ht 61.0 in | Wt 146.0 lb

## 2019-01-16 DIAGNOSIS — Z09 Encounter for follow-up examination after completed treatment for conditions other than malignant neoplasm: Secondary | ICD-10-CM

## 2019-01-16 DIAGNOSIS — J45909 Unspecified asthma, uncomplicated: Secondary | ICD-10-CM

## 2019-01-16 DIAGNOSIS — J449 Chronic obstructive pulmonary disease, unspecified: Secondary | ICD-10-CM | POA: Diagnosis not present

## 2019-01-16 DIAGNOSIS — R8290 Unspecified abnormal findings in urine: Secondary | ICD-10-CM

## 2019-01-16 DIAGNOSIS — G4700 Insomnia, unspecified: Secondary | ICD-10-CM

## 2019-01-16 DIAGNOSIS — N185 Chronic kidney disease, stage 5: Secondary | ICD-10-CM | POA: Diagnosis not present

## 2019-01-16 DIAGNOSIS — I10 Essential (primary) hypertension: Secondary | ICD-10-CM

## 2019-01-16 DIAGNOSIS — Z992 Dependence on renal dialysis: Secondary | ICD-10-CM

## 2019-01-16 DIAGNOSIS — E1122 Type 2 diabetes mellitus with diabetic chronic kidney disease: Secondary | ICD-10-CM | POA: Diagnosis not present

## 2019-01-16 LAB — POCT URINALYSIS DIP (MANUAL ENTRY)
Bilirubin, UA: NEGATIVE
Glucose, UA: NEGATIVE mg/dL
Ketones, POC UA: NEGATIVE mg/dL
Nitrite, UA: NEGATIVE
Protein Ur, POC: >=300 mg/dL — AB
Spec Grav, UA: 1.02 (ref 1.010–1.025)
Urobilinogen, UA: 0.2 E.U./dL
pH, UA: 6 (ref 5.0–8.0)

## 2019-01-16 LAB — POCT GLYCOSYLATED HEMOGLOBIN (HGB A1C): Hemoglobin A1C: 5.8 % — AB (ref 4.0–5.6)

## 2019-01-16 MED ORDER — ALBUTEROL SULFATE HFA 108 (90 BASE) MCG/ACT IN AERS: 2.0000 | INHALATION_SPRAY | RESPIRATORY_TRACT | 6 refills | Status: DC | PRN

## 2019-01-16 MED ORDER — TRAZODONE HCL 50 MG PO TABS: ORAL_TABLET | ORAL | 3 refills | Status: DC

## 2019-01-16 NOTE — Progress Notes (Signed)
Patient [facility]   Established Patient Office Visit  Subjective:  Patient ID: Tammy Moses, female    DOB: 03/22/1966  Age: 53 y.o. MRN: [id number]  CC:  Chief Complaint  Patient presents with  . Follow-up    Chronic conditon     HPI Tammy Moses is a 53 year old female who presents for Follow Up today.   Past Medical History:  Diagnosis Date  . Anemia   . Anxiety   . Blind right eye   . Constipation, chronic   . Dental caries   . Diabetes mellitus   . Diabetic neuropathy ([place])   . Diabetic retinopathy   . GERD (gastroesophageal reflux disease)   . Glaucoma   . H. pylori infection   . Hepatitis C carrier ([place])   . High risk sexual behavior   . Hyperlipidemia   . Hypertension   . Hypoglycemia 07/12/2017  . Insomnia   . Microalbuminuria   . Nonspecific elevation of levels of transaminase or lactic acid dehydrogenase (LDH)   . Tobacco dependence   . Vitamin D deficiency    Current Status: Since her last office visit, she is doing well with no complaints. She arrives via rollator today. She continues hemodialysis treatments on Monday, Wednesdays, and Friday. She has seen her Podiatrist. She has an Optometry appointment in a few months. She denies visual changes, chest pain, cough, shortness of breath, heart palpitations, and falls. She has occasional headaches and dizziness with position changes. Denies severe headaches, confusion, seizures, double vision, and blurred vision, nausea and vomiting. Her normal range of preprandial blood glucose levels are between 90-136. She denies fatigue, frequent urination, blurred vision, excessive hunger, excessive thirst, weight gain, weight loss, and poor wound healing. Her anxiety is stable today. She denies suicidal ideations, homicidal ideations, or auditory hallucinations.   She denies fevers, chills, fatigue, recent infections, weight loss, and night sweats. No reports of GI problems  such as diarrhea, and constipation. She has no reports of blood in stools, dysuria and hematuria. She denies pain today.   Past Surgical History:  Procedure Laterality Date  . ABDOMINAL HYSTERECTOMY  04/30/2009   PARTIAL  . COLONOSCOPY    . DIALYSIS FISTULA CREATION Left 06/2017  . ESOPHAGOGASTRODUODENOSCOPY      Family History  Problem Relation Age of Onset  . High blood pressure Mother   . Diabetes Mother   . Thyroid disease Mother   . Diabetes Father   . High blood pressure Father     Social History   Socioeconomic History  . Marital status: Married    Spouse name: Not on file  . Number of children: 2  . Years of education: 16  . Highest education level: Not on file  Occupational History    Employer: DISABLED    Comment: Disabled  Social Needs  . Financial resource strain: Not on file  . Food insecurity:    Worry: Not on file    Inability: Not on file  . Transportation needs:    Medical: Not on file    Non-medical: Not on file  Tobacco Use  . Smoking status: Current Every Day Smoker    Packs/day: 0.25    Types: Cigarettes  . Smokeless tobacco: Never Used  . Tobacco comment: trying to quit  Substance and Sexual Activity  . Alcohol use: Yes    Comment: 2-3 beers weekly  . Drug use: No    Types: Marijuana,  Cocaine    Comment: quit 3 months ago  . Sexual activity: Not Currently  Lifestyle  . Physical activity:    Days per week: Not on file    Minutes per session: Not on file  . Stress: Not on file  Relationships  . Social connections:    Talks on phone: Not on file    Gets together: Not on file    Attends religious service: Not on file    Active member of club or organization: Not on file    Attends meetings of clubs or organizations: Not on file    Relationship status: Not on file  . Intimate partner violence:    Fear of current or ex partner: Not on file    Emotionally abused: Not on file    Physically abused: Not on file    Forced sexual  activity: Not on file  Other Topics Concern  . Not on file  Social History Narrative   Patient lives at home with he boyfriend.    Disabled.   Right handed.   Education 11 th grade.   Caffeine three cups of coffee daily.    Outpatient Medications Prior to Visit  Medication Sig Dispense Refill  . amLODipine (NORVASC) 10 MG tablet Take 10 mg by mouth daily.  3  . atropine 1 % ophthalmic solution Place 1 drop into both eyes 2 (two) times daily.    . blood glucose meter kit and supplies KIT Dispense based on patient and insurance preference. Use up to four times daily as directed. (FOR ICD-10 E.11.9) 1 each 0  . buPROPion (WELLBUTRIN SR) 150 MG 12 hr tablet Take 1 tablet (150 mg total) by mouth 2 (two) times daily. 60 tablet 2  . calcium acetate (PHOSLO) 667 MG capsule Take 2,001 mg by mouth 3 (three) times daily with meals.    . Cholecalciferol (VITAMIN D-3) 1000 units CAPS Take 2 capsules (2,000 Units total) by mouth daily. 90 capsule 3  . furosemide (LASIX) 40 MG tablet Take 40 mg by mouth daily.  3  . gabapentin (NEURONTIN) 600 MG tablet TK 1 T PO BID    . glucose blood (ONE TOUCH ULTRA TEST) test strip USE TO TEST 3 TIMES A DAY 100 each 11  . hydrALAZINE (APRESOLINE) 100 MG tablet Take 1 tablet (100 mg total) by mouth 2 (two) times daily. 60 tablet 0  . Insulin Glargine (LANTUS SOLOSTAR) 100 UNIT/ML Solostar Pen Inject 20 Units into the skin daily at 10 pm. 5 pen PRN  . Insulin Pen Needle 29G X 12MM MISC E11.9 Dx Code Use once at bedtime with each nightly insulin 100 each 3  . ketotifen (ZADITOR) 0.025 % ophthalmic solution Place 1 drop into both eyes 2 (two) times daily. 5 mL 0  . Lancets (ONETOUCH ULTRASOFT) lancets Use as instructed 100 each 12  . omeprazole (PRILOSEC) 20 MG capsule Take 1 capsule (20 mg total) by mouth daily. Has to be taken at the same time with Harvoni FASTED 30 capsule 11  . pravastatin (PRAVACHOL) 80 MG tablet Take 1 tablet (80 mg total) by mouth daily. 15 tablet  0  . timolol (TIMOPTIC) 0.5 % ophthalmic solution Place 1 drop into the left eye at bedtime.   11  . Travoprost, BAK Free, (TRAVATAN) 0.004 % SOLN ophthalmic solution Place 1 drop into both eyes at bedtime.    Marland Kitchen albuterol (PROVENTIL HFA;VENTOLIN HFA) 108 (90 Base) MCG/ACT inhaler Inhale 2 puffs into the lungs every 4 (four)  hours as needed for shortness of breath. For shortness of breath 1 Inhaler 3  . cloNIDine (CATAPRES - DOSED IN MG/24 HR) 0.2 mg/24hr patch Place 1 patch (0.2 mg total) onto the skin once a week. (Patient not taking: Reported on 06/13/2018) 4 patch 12  . furosemide (LASIX) 20 MG tablet Take 1 tablet (20 mg total) by mouth daily as needed. 30 tablet 1  . gabapentin (NEURONTIN) 300 MG capsule Take 2 capsules (600 mg total) by mouth 3 (three) times daily. (Patient not taking: Reported on 01/16/2019) 180 capsule 3  . Gabapentin, Once-Daily, 600 MG TABS Take 600 mg by mouth 3 (three) times daily. (Patient not taking: Reported on 01/16/2019) 180 tablet 6  . labetalol (NORMODYNE) 200 MG tablet Take 400 mg by mouth daily.  1  . sulfamethoxazole-trimethoprim (BACTRIM DS,SEPTRA DS) 800-160 MG tablet Take 1 tablet by mouth 2 (two) times daily. (Patient not taking: Reported on 07/12/2018) 20 tablet 0  . traZODone (DESYREL) 50 MG tablet Take 1 tablet (50 mg total) by mouth at bedtime as needed for sleep. (Patient not taking: Reported on 07/12/2018) 30 tablet 3   No facility-administered medications prior to visit.     Allergies  Allergen Reactions  . Acyclovir And Related Itching    ROS Review of Systems  Constitutional: Negative.   HENT: Negative.   Eyes: Positive for visual disturbance.  Respiratory: Negative.   Cardiovascular: Negative.   Gastrointestinal: Negative.   Endocrine: Negative.   Genitourinary: Negative.   Musculoskeletal: Negative.   Skin: Negative.   Allergic/Immunologic: Negative.   Neurological: Positive for dizziness and headaches.  Hematological: Negative.    Psychiatric/Behavioral: Negative.    Objective:    Physical Exam  Constitutional: She is oriented to person, place, and time. She appears well-developed and well-nourished.  HENT:  Head: Normocephalic and atraumatic.  Eyes: Conjunctivae are normal.  Neck: Normal range of motion. Neck supple.  Cardiovascular: Normal rate, regular rhythm, normal heart sounds and intact distal pulses.  Pulmonary/Chest: Effort normal and breath sounds normal.  Abdominal: Soft. Bowel sounds are normal.  Musculoskeletal:     Comments: Decreased ROM  Neurological: She is alert and oriented to person, place, and time. She has normal reflexes.  Skin: Skin is warm and dry.  Psychiatric: She has a normal mood and affect. Her behavior is normal. Judgment and thought content normal.  Nursing note and vitals reviewed.   BP (!) 150/80 (BP Location: Right Arm, Patient Position: Sitting, Cuff Size: Small)   Pulse 90   Temp 98.1 F (36.7 C) (Oral)   Ht _0  (1.549 m)   Wt 146 lb (66.2 kg)   SpO2 100%   BMI 27.59 kg/m  Wt Readings from Last 3 Encounters:  01/16/19 146 lb (66.2 kg)  07/22/18 143 lb 8.3 oz (65.1 kg)  07/12/18 144 lb (65.3 kg)     Health Maintenance Due  Topic Date Due  . PNEUMOCOCCAL POLYSACCHARIDE VACCINE AGE 62-64 HIGH RISK  07/20/1966  . PAP SMEAR-Modifier  07/20/1985  . TETANUS/TDAP  10/25/2014    There are no preventive care reminders to display for this patient.  Lab Results  Component Value Date   TSH 2.690 12/13/2017   Lab Results  Component Value Date   WBC 8.5 07/22/2018   HGB 12.6 07/22/2018   HCT 39.0 07/22/2018   MCV 93.8 07/22/2018   PLT 252 07/22/2018   Lab Results  Component Value Date   NA 142 07/22/2018   K 3.5 07/22/2018  CO2 29 07/22/2018   GLUCOSE 150 (H) 07/22/2018   BUN 34 (H) 07/22/2018   CREATININE 6.42 (H) 07/22/2018   BILITOT 0.6 07/12/2018   ALKPHOS 69 07/12/2018   AST 27 07/12/2018   ALT 20 07/12/2018   PROT 7.6 07/12/2018   ALBUMIN 3.7  07/12/2018   CALCIUM 8.8 (L) 07/22/2018   ANIONGAP 13 07/22/2018   Lab Results  Component Value Date   CHOL 339 (H) 09/07/2016   Lab Results  Component Value Date   HDL 87 09/07/2016   Lab Results  Component Value Date   LDLCALC 213 (H) 09/07/2016   Lab Results  Component Value Date   TRIG 193 (H) 09/07/2016   Lab Results  Component Value Date   CHOLHDL 3.9 09/07/2016   Lab Results  Component Value Date   HGBA1C 5.8 (A) 01/16/2019   Assessment & Plan:   1. Type 2 diabetes mellitus with stage 5 chronic kidney disease (HCC) Hgb A1c is decreased at 5.8 today, from 6.7 on 06/14/2019. Continue insulin as prescribed. She will continue to decrease foods/beverages high in sugars and carbs and follow Heart Healthy or DASH diet. Increase physical activity to at least 30 minutes cardio exercise daily.  - POCT urinalysis dipstick - POCT glycosylated hemoglobin (Hb A1C)  2. Essential hypertension Blood pressure is 147/86 today. She will continue Amlodipine and Hydralazine as prescribed. She will continue to decrease high sodium intake, excessive alcohol intake, increase potassium intake, smoking cessation, and increase physical activity of at least 30 minutes of cardio activity daily. She will continue to follow Heart Healthy or DASH diet.  3. Moderate asthma without complication, unspecified whether persistent No signs and symptoms of respiratory distress noted or reported.  - albuterol (PROVENTIL HFA;VENTOLIN HFA) 108 (90 Base) MCG/ACT inhaler; Inhale 2 puffs into the lungs every 4 (four) hours as needed for shortness of breath. For shortness of breath  Dispense: 1 Inhaler; Refill: 6  4. Chronic obstructive pulmonary disease, unspecified COPD type (HCC) - albuterol (PROVENTIL HFA;VENTOLIN HFA) 108 (90 Base) MCG/ACT inhaler; Inhale 2 puffs into the lungs every 4 (four) hours as needed for shortness of breath. For shortness of breath  Dispense: 1 Inhaler; Refill: 6  5. Insomnia,  unspecified type We will increase Trazodone to 100 mg QHS.  - traZODone (DESYREL) 50 MG tablet; Take 2 tablets every night at bedtime for sleep  Dispense: 60 tablet; Refill: 3  6. Hemodialysis patient [facility]) Continue Hemodialysis on Mondays, Wednesdays, and Fridays.   7. Abnormal urinalysis Results are pending.  - Urine Culture  8. Follow up She will follow up in 6 months.   Meds ordered this encounter  Medications  . albuterol (PROVENTIL HFA;VENTOLIN HFA) 108 (90 Base) MCG/ACT inhaler    Sig: Inhale 2 puffs into the lungs every 4 (four) hours as needed for shortness of breath. For shortness of breath    Dispense:  1 Inhaler    Refill:  6  . traZODone (DESYREL) 50 MG tablet    Sig: Take 2 tablets every night at bedtime for sleep    Dispense:  60 tablet    Refill:  3    Orders Placed This Encounter  Procedures  . Urine Culture  . POCT urinalysis dipstick  . POCT glycosylated hemoglobin (Hb A1C)    Referral Orders  No referral(s) requested today    Kathe Becton,  MSN, FNP-C [facility] [address] [place], [place] [address] [phone]   Problem List  Items Addressed This Visit      Cardiovascular and Mediastinum   Essential hypertension     Respiratory   Asthma   Relevant Medications   albuterol (PROVENTIL HFA;VENTOLIN HFA) 108 (90 Base) MCG/ACT inhaler   COPD (chronic obstructive pulmonary disease) (HCC)   Relevant Medications   albuterol (PROVENTIL HFA;VENTOLIN HFA) 108 (90 Base) MCG/ACT inhaler    Other Visit Diagnoses    Type 2 diabetes mellitus with stage 5 chronic kidney disease ([place])    -  Primary   Relevant Orders   POCT urinalysis dipstick (Completed)   POCT glycosylated hemoglobin (Hb A1C) (Completed)   Insomnia, unspecified type       Relevant Medications   traZODone (DESYREL) 50 MG tablet   Hemodialysis patient ([place])       Abnormal urinalysis       Relevant Orders   Urine Culture   Follow up           Meds ordered this encounter  Medications  . albuterol (PROVENTIL HFA;VENTOLIN HFA) 108 (90 Base) MCG/ACT inhaler    Sig: Inhale 2 puffs into the lungs every 4 (four) hours as needed for shortness of breath. For shortness of breath    Dispense:  1 Inhaler    Refill:  6  . traZODone (DESYREL) 50 MG tablet    Sig: Take 2 tablets every night at bedtime for sleep    Dispense:  60 tablet    Refill:  3    Follow-up: Return in about 6 months (around 07/19/2019).    [name], FNP

## 2019-01-16 NOTE — Patient Instructions (Addendum)
Insomnia Insomnia is a sleep disorder that makes it difficult to fall asleep or stay asleep. Insomnia can cause fatigue, low energy, difficulty concentrating, mood swings, and poor performance at work or school. There are three different ways to classify insomnia:  Difficulty falling asleep.  Difficulty staying asleep.  Waking up too early in the morning. Any type of insomnia can be long-term (chronic) or short-term (acute). Both are common. Short-term insomnia usually lasts for three months or less. Chronic insomnia occurs at least three times a week for longer than three months. What are the causes? Insomnia may be caused by another condition, situation, or substance, such as:  Anxiety.  Certain medicines.  Gastroesophageal reflux disease (GERD) or other gastrointestinal conditions.  Asthma or other breathing conditions.  Restless legs syndrome, sleep apnea, or other sleep disorders.  Chronic pain.  Menopause.  Stroke.  Abuse of alcohol, tobacco, or illegal drugs.  Mental health conditions, such as depression.  Caffeine.  Neurological disorders, such as Alzheimer's disease.  An overactive thyroid (hyperthyroidism). Sometimes, the cause of insomnia may not be known. What increases the risk? Risk factors for insomnia include:  Gender. Women are affected more often than men.  Age. Insomnia is more common as you get older.  Stress.  Lack of exercise.  Irregular work schedule or working night shifts.  Traveling between different time zones.  Certain medical and mental health conditions. What are the signs or symptoms? If you have insomnia, the main symptom is having trouble falling asleep or having trouble staying asleep. This may lead to other symptoms, such as:  Feeling fatigued or having low energy.  Feeling nervous about going to sleep.  Not feeling rested in the morning.  Having trouble concentrating.  Feeling irritable, anxious, or depressed. How  is this diagnosed? This condition may be diagnosed based on:  Your symptoms and medical history. Your health care provider may ask about: ? Your sleep habits. ? Any medical conditions you have. ? Your mental health.  A physical exam. How is this treated? Treatment for insomnia depends on the cause. Treatment may focus on treating an underlying condition that is causing insomnia. Treatment may also include:  Medicines to help you sleep.  Counseling or therapy.  Lifestyle adjustments to help you sleep better. Follow these instructions at home: Eating and drinking   Limit or avoid alcohol, caffeinated beverages, and cigarettes, especially close to bedtime. These can disrupt your sleep.  Do not eat a large meal or eat spicy foods right before bedtime. This can lead to digestive discomfort that can make it hard for you to sleep. Sleep habits   Keep a sleep diary to help you and your health care provider figure out what could be causing your insomnia. Write down: ? When you sleep. ? When you wake up during the night. ? How well you sleep. ? How rested you feel the next day. ? Any side effects of medicines you are taking. ? What you eat and drink.  Make your bedroom a dark, comfortable place where it is easy to fall asleep. ? Put up shades or blackout curtains to block light from outside. ? Use a white noise machine to block noise. ? Keep the temperature cool.  Limit screen use before bedtime. This includes: ? Watching TV. ? Using your smartphone, tablet, or computer.  Stick to a routine that includes going to bed and waking up at the same times every day and night. This can help you fall asleep faster. Consider   making a quiet activity, such as reading, part of your nighttime routine.  Try to avoid taking naps during the day so that you sleep better at night.  Get out of bed if you are still awake after 15 minutes of trying to sleep. Keep the lights down, but try reading or  doing a quiet activity. When you feel sleepy, go back to bed. General instructions  Take over-the-counter and prescription medicines only as told by your health care provider.  Exercise regularly, as told by your health care provider. Avoid exercise starting several hours before bedtime.  Use relaxation techniques to manage stress. Ask your health care provider to suggest some techniques that may work well for you. These may include: ? Breathing exercises. ? Routines to release muscle tension. ? Visualizing peaceful scenes.  Make sure that you drive carefully. Avoid driving if you feel very sleepy.  Keep all follow-up visits as told by your health care provider. This is important. Contact a health care provider if:  You are tired throughout the day.  You have trouble in your daily routine due to sleepiness.  You continue to have sleep problems, or your sleep problems get worse. Get help right away if:  You have serious thoughts about hurting yourself or someone else. If you ever feel like you may hurt yourself or others, or have thoughts about taking your own life, get help right away. You can go to your nearest emergency department or call:  Your local emergency services (911 in the [place]  A suicide crisis helpline, such as the [place] at (947) 854-9096. This is open 24 hours a day. Summary  Insomnia is a sleep disorder that makes it difficult to fall asleep or stay asleep.  Insomnia can be long-term (chronic) or short-term (acute).  Treatment for insomnia depends on the cause. Treatment may focus on treating an underlying condition that is causing insomnia.  Keep a sleep diary to help you and your health care provider figure out what could be causing your insomnia. This information is not intended to replace advice given to you by your health care provider. Make sure you discuss any questions you have with your health care provider. Document  Released: 10/08/2000 Document Revised: 07/21/2017 Document Reviewed: 07/21/2017 Elsevier Interactive Patient Education  2019 [employer]    Trazodone tablets What is this medicine? TRAZODONE (TRAZ oh done) is used to treat depression. This medicine may be used for other purposes; ask your health care provider or pharmacist if you have questions. COMMON BRAND NAME(S): Desyrel What should I tell my health care provider before I take this medicine? They need to know if you have any of these conditions: -attempted suicide or thinking about it -bipolar disorder -bleeding problems -glaucoma -heart disease, or previous heart attack -irregular heart beat -kidney or liver disease -low levels of sodium in the blood -an unusual or allergic reaction to trazodone, other medicines, foods, dyes or preservatives -pregnant or trying to get pregnant -breast-feeding How should I use this medicine? Take this medicine by mouth with a glass of water. Follow the directions on the prescription label. Take this medicine shortly after a meal or a light snack. Take your medicine at regular intervals. Do not take your medicine more often than directed. Do not stop taking this medicine suddenly except upon the advice of your doctor. Stopping this medicine too quickly may cause serious side effects or your condition may worsen. A special MedGuide will be given to you by the  pharmacist with each prescription and refill. Be sure to read this information carefully each time. Talk to your pediatrician regarding the use of this medicine in children. Special care may be needed. Overdosage: If you think you have taken too much of this medicine contact a poison control center or emergency room at once. NOTE: This medicine is only for you. Do not share this medicine with others. What if I miss a dose? If you miss a dose, take it as soon as you can. If it is almost time for your next dose, take only that dose. Do not take  double or extra doses. What may interact with this medicine? Do not take this medicine with any of the following medications: -certain medicines for fungal infections like fluconazole, itraconazole, ketoconazole, posaconazole, voriconazole -cisapride -dofetilide -dronedarone -linezolid -MAOIs like Carbex, Eldepryl, Marplan, Nardil, and Parnate -mesoridazine -methylene blue (injected into a vein) -pimozide -saquinavir -thioridazine This medicine may also interact with the following medications: -alcohol -antiviral medicines for HIV or AIDS -aspirin and aspirin-like medicines -barbiturates like phenobarbital -certain medicines for blood pressure, heart disease, irregular heart beat -certain medicines for depression, anxiety, or psychotic disturbances -certain medicines for migraine headache like almotriptan, eletriptan, frovatriptan, naratriptan, rizatriptan, sumatriptan, zolmitriptan -certain medicines for seizures like carbamazepine and phenytoin -certain medicines for sleep -certain medicines that treat or prevent blood clots like dalteparin, enoxaparin, warfarin -digoxin -fentanyl -lithium -NSAIDS, medicines for pain and inflammation, like ibuprofen or naproxen -other medicines that prolong the QT interval (cause an abnormal heart rhythm) -rasagiline -supplements like St. John's wort, kava kava, valerian -tramadol -tryptophan This list may not describe all possible interactions. Give your health care provider a list of all the medicines, herbs, non-prescription drugs, or dietary supplements you use. Also tell them if you smoke, drink alcohol, or use illegal drugs. Some items may interact with your medicine. What should I watch for while using this medicine? Tell your doctor if your symptoms do not get better or if they get worse. Visit your doctor or health care professional for regular checks on your progress. Because it may take several weeks to see the full effects of this  medicine, it is important to continue your treatment as prescribed by your doctor. Patients and their families should watch out for new or worsening thoughts of suicide or depression. Also watch out for sudden changes in feelings such as feeling anxious, agitated, panicky, irritable, hostile, aggressive, impulsive, severely restless, overly excited and hyperactive, or not being able to sleep. If this happens, especially at the beginning of treatment or after a change in dose, call your health care professional. Dennis Bast may get drowsy or dizzy. Do not drive, use machinery, or do anything that needs mental alertness until you know how this medicine affects you. Do not stand or sit up quickly, especially if you are an older patient. This reduces the risk of dizzy or fainting spells. Alcohol may interfere with the effect of this medicine. Avoid alcoholic drinks. This medicine may cause dry eyes and blurred vision. If you wear contact lenses you may feel some discomfort. Lubricating drops may help. See your eye doctor if the problem does not go away or is severe. Your mouth may get dry. Chewing sugarless gum, sucking hard candy and drinking plenty of water may help. Contact your doctor if the problem does not go away or is severe. What side effects may I notice from receiving this medicine? Side effects that you should report to your doctor or health care professional as  soon as possible: -allergic reactions like skin rash, itching or hives, swelling of the face, lips, or tongue -elevated mood, decreased need for sleep, racing thoughts, impulsive behavior -confusion -fast, irregular heartbeat -feeling faint or lightheaded, falls -feeling agitated, angry, or irritable -loss of balance or coordination -painful or prolonged erections -restlessness, pacing, inability to keep still -suicidal thoughts or other mood changes -tremors -trouble sleeping -seizures -unusual bleeding or bruising Side effects that  usually do not require medical attention (report to your doctor or health care professional if they continue or are bothersome): -change in sex drive or performance -change in appetite or weight -constipation -headache -muscle aches or pains -nausea This list may not describe all possible side effects. Call your doctor for medical advice about side effects. You may report side effects to FDA at 1-800-FDA-1088. Where should I keep my medicine? Keep out of the reach of children. Store at room temperature between 15 and 30 degrees C (59 to 86 degrees F). Protect from light. Keep container tightly closed. Throw away any unused medicine after the expiration date. NOTE: This sheet is a summary. It may not cover all possible information. If you have questions about this medicine, talk to your doctor, pharmacist, or health care provider.  2019 Elsevier/Gold Standard (2017-12-20 17:51:24)

## 2019-01-18 LAB — URINE CULTURE

## 2019-01-21 ENCOUNTER — Other Ambulatory Visit: Payer: Self-pay | Admitting: Family Medicine

## 2019-01-21 DIAGNOSIS — N390 Urinary tract infection, site not specified: Secondary | ICD-10-CM

## 2019-01-21 DIAGNOSIS — R319 Hematuria, unspecified: Secondary | ICD-10-CM

## 2019-03-15 ENCOUNTER — Telehealth: Payer: Self-pay

## 2019-03-15 ENCOUNTER — Other Ambulatory Visit: Payer: Self-pay | Admitting: Family Medicine

## 2019-03-15 DIAGNOSIS — G63 Polyneuropathy in diseases classified elsewhere: Secondary | ICD-10-CM

## 2019-03-15 MED ORDER — GABAPENTIN 600 MG PO TABS: 600.0000 mg | ORAL_TABLET | Freq: Two times a day (BID) | ORAL | 1 refills | Status: DC

## 2019-03-15 NOTE — Telephone Encounter (Signed)
Left a detail vm  

## 2019-03-15 NOTE — Telephone Encounter (Signed)
Requesting a refill on Gabapentin 90 day

## 2019-04-03 ENCOUNTER — Ambulatory Visit (INDEPENDENT_AMBULATORY_CARE_PROVIDER_SITE_OTHER): Payer: Medicare Other | Admitting: Podiatry

## 2019-04-03 ENCOUNTER — Other Ambulatory Visit: Payer: Self-pay

## 2019-04-03 ENCOUNTER — Encounter: Payer: Self-pay | Admitting: Podiatry

## 2019-04-03 VITALS — Temp 98.0°F

## 2019-04-03 DIAGNOSIS — E1142 Type 2 diabetes mellitus with diabetic polyneuropathy: Secondary | ICD-10-CM | POA: Diagnosis not present

## 2019-04-03 DIAGNOSIS — M79675 Pain in left toe(s): Secondary | ICD-10-CM | POA: Diagnosis not present

## 2019-04-03 DIAGNOSIS — L84 Corns and callosities: Secondary | ICD-10-CM

## 2019-04-03 DIAGNOSIS — M79674 Pain in right toe(s): Secondary | ICD-10-CM

## 2019-04-03 DIAGNOSIS — B351 Tinea unguium: Secondary | ICD-10-CM

## 2019-04-03 NOTE — Patient Instructions (Signed)
Diabetes Mellitus and Foot Care °Foot care is an important part of your health, especially when you have diabetes. Diabetes may cause you to have problems because of poor blood flow (circulation) to your feet and legs, which can cause your skin to: °· Become thinner and drier. °· Break more easily. °· Heal more slowly. °· Peel and crack. °You may also have nerve damage (neuropathy) in your legs and feet, causing decreased feeling in them. This means that you may not notice minor injuries to your feet that could lead to more serious problems. Noticing and addressing any potential problems early is the best way to prevent future foot problems. °How to care for your feet °Foot hygiene °· Wash your feet daily with warm water and mild soap. Do not use hot water. Then, pat your feet and the areas between your toes until they are completely dry. Do not soak your feet as this can dry your skin. °· Trim your toenails straight across. Do not dig under them or around the cuticle. File the edges of your nails with an emery board or nail file. °· Apply a moisturizing lotion or petroleum jelly to the skin on your feet and to dry, brittle toenails. Use lotion that does not contain alcohol and is unscented. Do not apply lotion between your toes. °Shoes and socks °· Wear clean socks or stockings every day. Make sure they are not too tight. Do not wear knee-high stockings since they may decrease blood flow to your legs. °· Wear shoes that fit properly and have enough cushioning. Always look in your shoes before you put them on to be sure there are no objects inside. °· To break in new shoes, wear them for just a few hours a day. This prevents injuries on your feet. °Wounds, scrapes, corns, and calluses °· Check your feet daily for blisters, cuts, bruises, sores, and redness. If you cannot see the bottom of your feet, use a mirror or ask someone for help. °· Do not cut corns or calluses or try to remove them with medicine. °· If you  find a minor scrape, cut, or break in the skin on your feet, keep it and the skin around it clean and dry. You may clean these areas with mild soap and water. Do not clean the area with peroxide, alcohol, or iodine. °· If you have a wound, scrape, corn, or callus on your foot, look at it several times a day to make sure it is healing and not infected. Check for: °? Redness, swelling, or pain. °? Fluid or blood. °? Warmth. °? Pus or a bad smell. °General instructions °· Do not cross your legs. This may decrease blood flow to your feet. °· Do not use heating pads or hot water bottles on your feet. They may burn your skin. If you have lost feeling in your feet or legs, you may not know this is happening until it is too late. °· Protect your feet from hot and cold by wearing shoes, such as at the beach or on hot pavement. °· Schedule a complete foot exam at least once a year (annually) or more often if you have foot problems. If you have foot problems, report any cuts, sores, or bruises to your health care provider immediately. °Contact a health care provider if: °· You have a medical condition that increases your risk of infection and you have any cuts, sores, or bruises on your feet. °· You have an injury that is not   healing. °· You have redness on your legs or feet. °· You feel burning or tingling in your legs or feet. °· You have pain or cramps in your legs and feet. °· Your legs or feet are numb. °· Your feet always feel cold. °· You have pain around a toenail. °Get help right away if: °· You have a wound, scrape, corn, or callus on your foot and: °? You have pain, swelling, or redness that gets worse. °? You have fluid or blood coming from the wound, scrape, corn, or callus. °? Your wound, scrape, corn, or callus feels warm to the touch. °? You have pus or a bad smell coming from the wound, scrape, corn, or callus. °? You have a fever. °? You have a red line going up your leg. °Summary °· Check your feet every day  for cuts, sores, red spots, swelling, and blisters. °· Moisturize feet and legs daily. °· Wear shoes that fit properly and have enough cushioning. °· If you have foot problems, report any cuts, sores, or bruises to your health care provider immediately. °· Schedule a complete foot exam at least once a year (annually) or more often if you have foot problems. °This information is not intended to replace advice given to you by your health care provider. Make sure you discuss any questions you have with your health care provider. °Document Released: 10/08/2000 Document Revised: 11/23/2017 Document Reviewed: 11/12/2016 °[employer] Interactive Patient Education © 2019 [employer] ° °Diabetic Neuropathy °Diabetic neuropathy refers to nerve damage that is caused by diabetes (diabetes mellitus). Over time, people with diabetes can develop nerve damage throughout the body. There are several types of diabetic neuropathy: °· Peripheral neuropathy. This is the most common type of diabetic neuropathy. It causes damage to nerves that carry signals between the spinal cord and other parts of the body (peripheral nerves). This usually affects nerves in the feet and legs first, and may eventually affect the hands and arms. The damage affects the ability to sense touch or temperature. °· Autonomic neuropathy. This type causes damage to nerves that control involuntary functions (autonomic nerves). These nerves carry signals that control: °? Heartbeat. °? Body temperature. °? Blood pressure. °? Urination. °? Digestion. °? Sweating. °? Sexual function. °? Response to changing blood sugar (glucose) levels. °· Focal neuropathy. This type of nerve damage affects one area of the body, such as an arm, a leg, or the face. The injury may involve one nerve or a small group of nerves. Focal neuropathy can be painful and unpredictable, and occurs most often in older adults with diabetes. This often develops suddenly, but usually improves over time  and does not cause long-term problems. °· Proximal neuropathy. This type of nerve damage affects the nerves of the thighs, hips, buttocks, or legs. It causes severe pain, weakness, and muscle death (atrophy), usually in the thigh muscles. It is more common among older men and people who have type 2 diabetes. The length of recovery time may vary. °What are the causes? °Peripheral, autonomic, and focal neuropathies are caused by diabetes that is not well controlled with treatment. The cause of proximal neuropathy is not known, but it may be caused by inflammation related to uncontrolled blood glucose levels. °What are the signs or symptoms? °Peripheral neuropathy °Peripheral neuropathy develops slowly over time. When the nerves of the feet and legs no longer work, you may experience: °· Burning, stabbing, or aching pain in the legs or feet. °· Pain or cramping in the   legs or feet. °· Loss of feeling (numbness) and inability to feel pressure or pain in the feet. This can lead to: °? Thick calluses or sores on areas of constant pressure. °? Ulcers. °? Reduced ability to feel temperature changes. °· Foot deformities. °· Muscle weakness. °· Loss of balance or coordination. °Autonomic neuropathy °The symptoms of autonomic neuropathy vary depending on which nerves are affected. Symptoms may include: °· Problems with digestion, such as: °? Nausea or vomiting. °? Poor appetite. °? Bloating. °? Diarrhea or constipation. °? Trouble swallowing. °? Losing weight without trying to. °· Problems with the heart, blood and lungs, such as: °? Dizziness, especially when standing up. °? Fainting. °? Shortness of breath. °? Irregular heartbeat. °· Bladder problems, such as: °? Trouble starting or stopping urination. °? Leaking urine. °? Trouble emptying the bladder. °? Urinary tract infections (UTIs). °· Problems with other body functions, such as: °? Sweat. You may sweat too much or too little. °? Temperature. You might get hot easily.  Or, you might feel cold more than usual. °? Sexual function. Men may not be able to get or maintain an erection. Women may have vaginal dryness and difficulty with arousal. °Focal neuropathy °Symptoms affect only one area of the body. Common symptoms include: °· Numbness. °· Tingling. °· Burning pain. °· Prickling feeling. °· Very sensitive skin. °· Weakness. °· Inability to move (paralysis). °· Muscle twitching. °· Muscles getting smaller (wasting). °· Poor coordination. °· Double or blurred vision. °Proximal neuropathy °· Sudden, severe pain in the hip, thigh, or buttocks. Pain may spread from the back into the legs (sciatica). °· Pain and numbness in the arms and legs. °· Tingling. °· Loss of bladder control or bowel control. °· Weakness and wasting of thigh muscles. °· Difficulty getting up from a seated position. °· Abdominal swelling. °· Unexplained weight loss. °How is this diagnosed? °Diagnosis usually involves reviewing your medical history and any symptoms you have. Diagnosis varies depending on the type of neuropathy your health care provider suspects. °Peripheral neuropathy °Your health care provider will check areas that are affected by your nervous system (neurologic exam), such as your reflexes, how you move, and what you can feel. You may have other tests, such as: °· Blood tests. °· Removal and examination of fluid that surrounds the spinal cord (lumbar puncture). °· CT scan. °· MRI. °· A test to check the nerves that control muscles (electromyogram, EMG). °· Tests of how quickly messages pass through your nerves (nerve conduction velocity tests). °· Removal of a small piece of nerve to be examined under a microscope (biopsy). °Autonomic neuropathy °You may have tests, such as: °· Tests to measure your blood pressure and heart rate. This may include monitoring you while you are safely secured to an exam table that moves you from a lying position to an upright position (table tilt test). °· Breathing  tests to check your lungs. °· Tests to check how food moves through the digestive system (gastric emptying tests). °· Blood, sweat, or urine tests. °· Ultrasound of your bladder. °· Spinal fluid tests. °Focal neuropathy °This condition may be diagnosed with: °· A neurologic exam. °· CT scan. °· MRI. °· EMG. °· Nerve conduction velocity tests. °Proximal neuropathy °There is no test to diagnose this type of neuropathy. You may have tests to rule out other possible causes of this type of neuropathy. Tests may include: °· X-rays of your spine and lumbar region. °· Lumbar puncture. °· MRI. °How is this treated? °The   goal of treatment is to keep nerve damage from getting worse. The most important part of treatment is keeping your blood glucose level and your A1C level within your target range by following your diabetes management plan. Over time, maintaining lower blood glucose levels helps lessen symptoms. In some cases, you may need prescription pain medicine. °Follow these instructions at home: ° °Lifestyle ° °· Do not use any products that contain nicotine or tobacco, such as cigarettes and e-cigarettes. If you need help quitting, ask your health care provider. °· Be physically active every day. Include strength training and balance exercises. °· Follow a healthy meal plan. °· Work with your health care provider to manage your blood pressure. °General instructions °· Follow your diabetes management plan as directed. °? Check your blood glucose levels as directed by your health care provider. °? Keep your blood glucose in your target range as directed by your health care provider. °? Have your A1C level checked at least two times a year, or as often as told by your health care provider. °· Take over the counter and prescription medicines only as told by your health care provider. This includes insulin and diabetes medicine. °· Do not drive or use heavy machinery while taking prescription pain medicines. °· Check your  skin and feet every day for cuts, bruises, redness, blisters, or sores. °· Keep all follow up visits as told by your health care provider. This is important. °Contact a health care provider if: °· You have burning, stabbing, or aching pain in your legs or feet. °· You are unable to feel pressure or pain in your feet. °· You develop problems with digestion, such as: °? Nausea. °? Vomiting. °? Bloating. °? Constipation. °? Diarrhea. °? Abdominal pain. °· You have difficulty with urination, such as inability: °? To control when you urinate (incontinence). °? To completely empty the bladder (retention). °· You have palpitations. °· You feel dizzy, weak, or faint when you stand up. °Get help right away if: °· You cannot urinate. °· You have sudden weakness or loss of coordination. °· You have trouble speaking. °· You have pain or pressure in your chest. °· You have an irregular heart beat. °· You have sudden inability to move a part of your body. °Summary °· Diabetic neuropathy refers to nerve damage that is caused by diabetes. It can affect nerves throughout the entire body, causing numbness and pain in the arms, legs, digestive tract, heart, and other body systems. °· Keep your blood glucose level and your blood pressure in your target range, as directed by your health care provider. This can help prevent neuropathy from getting worse. °· Check your skin and feet every day for cuts, bruises, redness, blisters, or sores. °· Do not use any products that contain nicotine or tobacco, such as cigarettes and e-cigarettes. If you need help quitting, ask your health care provider. °This information is not intended to replace advice given to you by your health care provider. Make sure you discuss any questions you have with your health care provider. °Document Released: 12/20/2001 Document Revised: 11/23/2017 Document Reviewed: 11/15/2016 °Elsevier Interactive Patient Education © 2019 [employer] ° °

## 2019-04-05 ENCOUNTER — Ambulatory Visit: Payer: Medicare Other | Admitting: Podiatry

## 2019-04-10 NOTE — Progress Notes (Addendum)
Subjective: Tammy Moses presents for preventative foot care visit with h/o diabetic neuropathy, painful, mycotic toenails and plantar callus left foot. Callus is due to scar sustained in childhood from cutting her foot on a [employer] bottle.  Tammy Moses voices no new pedal concerns on today's visit. She is requesting assistance in obtaining diabetic shoes.  [name], FNP is her PCP.   Current Outpatient Medications:  .  albuterol (PROVENTIL HFA;VENTOLIN HFA) 108 (90 Base) MCG/ACT inhaler, Inhale 2 puffs into the lungs every 4 (four) hours as needed for shortness of breath. For shortness of breath, Disp: 1 Inhaler, Rfl: 6 .  amLODipine (NORVASC) 10 MG tablet, Take 10 mg by mouth daily., Disp: , Rfl: 3 .  atropine 1 % ophthalmic solution, Place 1 drop into both eyes 2 (two) times daily., Disp: , Rfl:  .  blood glucose meter kit and supplies KIT, Dispense based on patient and insurance preference. Use up to four times daily as directed. (FOR ICD-10 E.11.9), Disp: 1 each, Rfl: 0 .  buPROPion (WELLBUTRIN SR) 150 MG 12 hr tablet, Take 1 tablet (150 mg total) by mouth 2 (two) times daily., Disp: 60 tablet, Rfl: 2 .  calcium acetate (PHOSLO) 667 MG capsule, Take 2,001 mg by mouth 3 (three) times daily with meals., Disp: , Rfl:  .  Cholecalciferol (VITAMIN D-3) 1000 units CAPS, Take 2 capsules (2,000 Units total) by mouth daily., Disp: 90 capsule, Rfl: 3 .  furosemide (LASIX) 40 MG tablet, Take 40 mg by mouth daily., Disp: , Rfl: 3 .  gabapentin (NEURONTIN) 600 MG tablet, Take 1 tablet (600 mg total) by mouth 2 (two) times daily., Disp: 180 tablet, Rfl: 1 .  glucose blood (ONE TOUCH ULTRA TEST) test strip, USE TO TEST 3 TIMES A DAY, Disp: 100 each, Rfl: 11 .  hydrALAZINE (APRESOLINE) 100 MG tablet, Take 1 tablet (100 mg total) by mouth 2 (two) times daily., Disp: 60 tablet, Rfl: 0 .  Insulin Glargine (LANTUS SOLOSTAR) 100 UNIT/ML Solostar Pen, Inject 20 Units into the skin daily at 10 pm., Disp:  5 pen, Rfl: PRN .  Insulin Pen Needle 29G X 12MM MISC, E11.9 Dx Code Use once at bedtime with each nightly insulin, Disp: 100 each, Rfl: 3 .  ketotifen (ZADITOR) 0.025 % ophthalmic solution, Place 1 drop into both eyes 2 (two) times daily., Disp: 5 mL, Rfl: 0 .  Lancets (ONETOUCH ULTRASOFT) lancets, Use as instructed, Disp: 100 each, Rfl: 12 .  omeprazole (PRILOSEC) 20 MG capsule, Take 1 capsule (20 mg total) by mouth daily. Has to be taken at the same time with Harvoni FASTED, Disp: 30 capsule, Rfl: 11 .  pravastatin (PRAVACHOL) 80 MG tablet, Take 1 tablet (80 mg total) by mouth daily., Disp: 15 tablet, Rfl: 0 .  timolol (TIMOPTIC) 0.5 % ophthalmic solution, Place 1 drop into the left eye at bedtime. , Disp: , Rfl: 11 .  Travoprost, BAK Free, (TRAVATAN) 0.004 % SOLN ophthalmic solution, Place 1 drop into both eyes at bedtime., Disp: , Rfl:  .  traZODone (DESYREL) 50 MG tablet, Take 2 tablets every night at bedtime for sleep, Disp: 60 tablet, Rfl: 3  Allergies  Allergen Reactions  . Acyclovir And Related Itching    Objective: Vitals:   04/03/19 0805  Temp: 98 F (36.7 C)    Vascular Examination: Capillary refill time <3 seconds x 10 digits.  Dorsalis pedis pulses 1/4 b/l.  Posterior tibial pulses 1/4 b/l.  Digital hair decreased x 10  digits.  Skin temperature gradient WNL b/l.  Dermatological Examination: Skin with normal turgor, texture and tone b/l.  Toenails 1-5 b/l discolored, thick, dystrophic with subungual debris and pain with palpation to nailbeds due to thickness of nails.  Hyperkeratotic lesion(s) plantar arch left foot x 2. No erythema, no edema, no drainage, no flocculence noted.   Musculoskeletal: Muscle strength 5/5 to all LE muscle groups.  No pain, crepitus or joint limitation with passive/active ROM.  Neurological: Sensation left foot 0/5, right foot 4/5 when tested with 10 gram monofilament.  Assessment: 1. Painful onychomycosis toenails 1-5  b/l 2. Callus plantar arch x 2 left foot 3. NIDDM with Diabetic neuropathy  Plan: 1. Continue diabetic foot care principles. Literature dispensed on today. 2. Toenails 1-5 b/l were debrided in length and girth without iatrogenic bleeding. 3. Calluses pared left arch x 2 utilizing sterile scalpel blade without  4. Patient to continue soft, supportive shoe gear.  We did discuss diabetic shoes. Per Medicare guidelines, patient's feet need to be evaluated by an MD/DO managing patient's diabetes and diabetic shoe certification form needs to be signed by the MD/DO. If patient's diabetes is being managed by an Endocrinologist, the Endocrinologist must evaluate patient's feet and sign the Medicare diabetic shoe certification form. She related understanding of guidelines. 5. Patient to report any pedal injuries to medical professional  6. Follow up 3 months.  7. Patient/POA to call should there be a concern in the interim.

## 2019-05-02 ENCOUNTER — Emergency Department (HOSPITAL_COMMUNITY)
Admission: EM | Admit: 2019-05-02 | Discharge: 2019-05-02 | Disposition: A | Discharge status: Home / Self Care | Payer: Medicare Other | Attending: Emergency Medicine | Admitting: Emergency Medicine

## 2019-05-02 ENCOUNTER — Encounter (HOSPITAL_COMMUNITY): Payer: Self-pay | Admitting: Emergency Medicine

## 2019-05-02 ENCOUNTER — Emergency Department (HOSPITAL_COMMUNITY): Payer: Medicare Other

## 2019-05-02 DIAGNOSIS — F329 Major depressive disorder, single episode, unspecified: Secondary | ICD-10-CM | POA: Insufficient documentation

## 2019-05-02 DIAGNOSIS — Z794 Long term (current) use of insulin: Secondary | ICD-10-CM | POA: Insufficient documentation

## 2019-05-02 DIAGNOSIS — J449 Chronic obstructive pulmonary disease, unspecified: Secondary | ICD-10-CM | POA: Insufficient documentation

## 2019-05-02 DIAGNOSIS — Z79899 Other long term (current) drug therapy: Secondary | ICD-10-CM | POA: Insufficient documentation

## 2019-05-02 DIAGNOSIS — I120 Hypertensive chronic kidney disease with stage 5 chronic kidney disease or end stage renal disease: Secondary | ICD-10-CM | POA: Diagnosis not present

## 2019-05-02 DIAGNOSIS — Z992 Dependence on renal dialysis: Secondary | ICD-10-CM | POA: Insufficient documentation

## 2019-05-02 DIAGNOSIS — E1122 Type 2 diabetes mellitus with diabetic chronic kidney disease: Secondary | ICD-10-CM | POA: Insufficient documentation

## 2019-05-02 DIAGNOSIS — F17210 Nicotine dependence, cigarettes, uncomplicated: Secondary | ICD-10-CM | POA: Insufficient documentation

## 2019-05-02 DIAGNOSIS — N186 End stage renal disease: Secondary | ICD-10-CM | POA: Diagnosis not present

## 2019-05-02 DIAGNOSIS — Z9115 Patient's noncompliance with renal dialysis: Secondary | ICD-10-CM | POA: Diagnosis not present

## 2019-05-02 DIAGNOSIS — B182 Chronic viral hepatitis C: Secondary | ICD-10-CM | POA: Insufficient documentation

## 2019-05-02 DIAGNOSIS — F32A Depression, unspecified: Secondary | ICD-10-CM

## 2019-05-02 LAB — URINALYSIS, ROUTINE W REFLEX MICROSCOPIC
Bilirubin Urine: NEGATIVE
Glucose, UA: 50 mg/dL — AB
Ketones, ur: 5 mg/dL — AB
Leukocytes,Ua: NEGATIVE
Nitrite: NEGATIVE
Protein, ur: >=300 mg/dL — AB
Specific Gravity, Urine: 1.015 (ref 1.005–1.030)
pH: 5 (ref 5.0–8.0)

## 2019-05-02 LAB — RAPID URINE DRUG SCREEN, HOSP PERFORMED
Amphetamines: NOT DETECTED
Barbiturates: NOT DETECTED
Benzodiazepines: NOT DETECTED
Cocaine: NOT DETECTED
Opiates: NOT DETECTED
Tetrahydrocannabinol: POSITIVE — AB

## 2019-05-02 LAB — HEPATIC FUNCTION PANEL
ALT: 16 U/L (ref 0–44)
AST: 25 U/L (ref 15–41)
Albumin: 4.5 g/dL (ref 3.5–5.0)
Alkaline Phosphatase: 68 U/L (ref 38–126)
Bilirubin, Direct: 0.2 mg/dL (ref 0.0–0.2)
Indirect Bilirubin: 0.5 mg/dL (ref 0.3–0.9)
Total Bilirubin: 0.7 mg/dL (ref 0.3–1.2)
Total Protein: 9.8 g/dL — ABNORMAL HIGH (ref 6.5–8.1)

## 2019-05-02 LAB — BASIC METABOLIC PANEL
Anion gap: 15 (ref 5–15)
BUN: 55 mg/dL — ABNORMAL HIGH (ref 6–20)
CO2: 14 mmol/L — ABNORMAL LOW (ref 22–32)
Calcium: 9.1 mg/dL (ref 8.9–10.3)
Chloride: 108 mmol/L (ref 98–111)
Creatinine, Ser: 10.54 mg/dL — ABNORMAL HIGH (ref 0.44–1.00)
GFR calc Af Amer: 4 mL/min — ABNORMAL LOW (ref >60)
GFR calc non Af Amer: 4 mL/min — ABNORMAL LOW (ref >60)
Glucose, Bld: 100 mg/dL — ABNORMAL HIGH (ref 70–99)
Potassium: 4.5 mmol/L (ref 3.5–5.1)
Sodium: 137 mmol/L (ref 135–145)

## 2019-05-02 LAB — CBC
HCT: 48.1 % — ABNORMAL HIGH (ref 36.0–46.0)
Hemoglobin: 14.9 g/dL (ref 12.0–15.0)
MCH: 31.4 pg (ref 26.0–34.0)
MCHC: 31 g/dL (ref 30.0–36.0)
MCV: 101.3 fL — ABNORMAL HIGH (ref 80.0–100.0)
Platelets: 293 10*3/uL (ref 150–400)
RBC: 4.75 MIL/uL (ref 3.87–5.11)
RDW: 13.8 % (ref 11.5–15.5)
WBC: 7.2 10*3/uL (ref 4.0–10.5)
nRBC: 0 % (ref 0.0–0.2)

## 2019-05-02 LAB — CBG MONITORING, ED: Glucose-Capillary: 79 mg/dL (ref 70–99)

## 2019-05-02 MED ORDER — AMLODIPINE BESYLATE 5 MG PO TABS
10.0000 mg | ORAL_TABLET | Freq: Once | ORAL | Status: AC
  Administered 2019-05-02: 10 mg via ORAL
  Filled 2019-05-02: qty 2

## 2019-05-02 MED ORDER — HYDRALAZINE HCL 25 MG PO TABS
100.0000 mg | ORAL_TABLET | Freq: Once | ORAL | Status: AC
  Administered 2019-05-02: 12:00:00 100 mg via ORAL
  Filled 2019-05-02: qty 4

## 2019-05-02 MED ORDER — SODIUM CHLORIDE 0.9% FLUSH: 3.0000 mL | Freq: Once | INTRAVENOUS | Status: DC

## 2019-05-02 NOTE — ED Triage Notes (Signed)
Pt has missed her last 4 dialysis treatments due to being out of town. Pt also did not take any of her medications today.

## 2019-05-02 NOTE — ED Notes (Signed)
Pt given sprite per RN.

## 2019-05-02 NOTE — ED Notes (Signed)
TTS in progress 

## 2019-05-02 NOTE — ED Notes (Addendum)
Pt states that she hasn't had dialysis in almost two weeks states "I was going through a time when I didn't want to live anymore but I don't feel like that anymore. July is a hard time for me." States that she last took her medication for her BP yesterday.

## 2019-05-02 NOTE — Discharge Instructions (Signed)
Please call your dialysis center to see if they can see you tomorrow.   If your symptoms worsen or you have additional concerns please return to the emergency room.

## 2019-05-02 NOTE — ED Notes (Signed)
Pt ambulating to and from restroom.

## 2019-05-02 NOTE — ED Provider Notes (Signed)
[facility] EMERGENCY DEPARTMENT Provider Note   CSN: [id number] Arrival date & time: 05/02/19  0945    History   Chief Complaint Chief Complaint  Patient presents with  . Vascular Access Problem    Missed dialysis x4 sessions    HPI Tammy Moses is a 53 y.o. female with a past medical history of hepatitis C, DM, ESRD on HD Monday Wednesday Friday, visually impaired, hypertension, who presents today for evaluation.  She reports that she has not been to dialysis since 04/22/2019 missing approximately 4 dialysis sessions.  She states that she called her dialysis center today and they told her that after missing that many sessions she needs to come to the emergency room.  She also states that she did not take her morning blood pressure medicines of hydralazine and amlodipine.  She reports that she has these medicines available however just forgot to take them this morning. She reports that she is not having any chest pain or shortness of breath.  She says that she has been having issues with insomnia and has had a difficult time recently.  Her daughter passed away in 06-25-2023 and her birthday is this month.  Patient states that last week she got to the point where "I do not care if I live or die."  She states that she went on a trip to [facility] and did not have any dialysis during that time.  She states that she was able to see her grandchild which is helped her feel better and she currently denies any SI.  She states that she wants to live to see her grandchild grow up.  She denies any history of previous SI.  She states that she has had difficulty getting into see outpatient psychiatry.  Currently she denies any chest pain or shortness of breath.  No abdominal pain, nausea, vomiting, or diarrhea.  No fevers or coughing.  She denies any headache.  She reports that      HPI  Past Medical History:  Diagnosis Date  . Anemia   . Anxiety   . Blind right eye   .  Constipation, chronic   . Dental caries   . Diabetes mellitus   . Diabetic neuropathy ([place])   . Diabetic retinopathy   . GERD (gastroesophageal reflux disease)   . Glaucoma   . H. pylori infection   . Hepatitis C carrier ([place])   . High risk sexual behavior   . Hyperlipidemia   . Hypertension   . Hypoglycemia 07/12/2017  . Insomnia   . Microalbuminuria   . Nonspecific elevation of levels of transaminase or lactic acid dehydrogenase (LDH)   . Tobacco dependence   . Vitamin D deficiency     Patient Active Problem List   Diagnosis Date Noted  . Impaired functional mobility and activity tolerance 01/04/2019  . Acute renal failure superimposed on stage 5 chronic kidney disease, not on chronic dialysis ([place]) 10/05/2017  . Pneumonia of both lungs due to infectious organism 10/05/2017  . Fall   . Hypoglycemia 07/12/2017  . CKD (chronic kidney disease) stage 5, GFR less than 15 ml/min ([facility]) 07/12/2017  . Anemia 07/12/2017  . Alcoholic intoxication without complication ([place])   . COPD (chronic obstructive pulmonary disease) ([place]) 06/24/2017  . ESRD (end stage renal disease) on dialysis ([place]) 06/17/2017  . Foot drop 05/26/2017  . Carpal tunnel syndrome of left wrist 05/20/2017  . 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 Poor compliance  with medication 05/17/2017  . Peripheral neuropathy 05/10/2017  . Diabetes mellitus ([place]) 05/10/2017  . 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  . Vitamin D deficiency 04/07/2017  . Absolute glaucoma of right eye 12/27/2016  . Chronic angle-closure glaucoma of left eye, severe stage 12/27/2016  . PCO (posterior capsule opacification), left 12/27/2016  . Gait abnormality 03/07/2014  . Abnormality of gait 03/07/2014  . Hepatitis C virus infection without hepatic coma 03/04/2014  . History of hepatitis C 03/04/2014  . Erosive gastritis 02/14/2014  . Pseudophakia of left eye 10/30/2012  . Asthma 10/13/2012  . Reflux 10/13/2012  . Lens  replaced by other means 10/12/2012  . Primary open angle glaucoma 10/12/2012  . Encounter for screening for diabetes mellitus 10/09/2012  . 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 BACK PAIN, LUMBAR 08/18/2010  . IDDM 08/11/2010  . HYPERCHOLESTEROLEMIA 08/11/2010  . HYPOKALEMIA 08/11/2010  . DEPRESSION 08/11/2010  . GLAUCOMA 08/11/2010  . BLINDNESS, RIGHT EYE 08/11/2010  . Essential hypertension 08/11/2010  . GERD 08/11/2010  . CONSTIPATION 08/06/2009    Past Surgical History:  Procedure Laterality Date  . ABDOMINAL HYSTERECTOMY  04/30/2009   PARTIAL  . COLONOSCOPY    . DIALYSIS FISTULA CREATION Left 06/2017  . ESOPHAGOGASTRODUODENOSCOPY       OB History   No obstetric history on file.      Home Medications    Prior to Admission medications   Medication Sig Start Date End Date Taking? Authorizing Provider  albuterol (PROVENTIL HFA;VENTOLIN HFA) 108 (90 Base) MCG/ACT inhaler Inhale 2 puffs into the lungs every 4 (four) hours as needed for shortness of breath. For shortness of breath 01/16/19  Yes [name], FNP  amLODipine (NORVASC) 10 MG tablet Take 10 mg by mouth daily. 07/03/18  Yes [provider]  atropine 1 % ophthalmic solution Place 1 drop into both eyes 2 (two) times daily.   Yes [provider]  blood glucose meter kit and supplies KIT Dispense based on patient and insurance preference. Use up to four times daily as directed. (FOR ICD-10 E.11.9) 11/30/17  Yes Scot Jun, FNP  buPROPion [facility] SR) 150 MG 12 hr tablet Take 1 tablet (150 mg total) by mouth 2 (two) times daily. 02/01/18  Yes Scot Jun, FNP  calcium acetate (PHOSLO) 667 MG capsule Take 2,001 mg by mouth 3 (three) times daily with meals. 07/06/18  Yes [provider]  Cholecalciferol (VITAMIN D-3) 1000 units CAPS Take 2 capsules (2,000 Units total) by mouth daily. 12/13/17  Yes Scot Jun, FNP  furosemide  (LASIX) 40 MG tablet Take 40 mg by mouth daily. 07/03/18  Yes [provider]  gabapentin (NEURONTIN) 600 MG tablet Take 1 tablet (600 mg total) by mouth 2 (two) times daily. 03/15/19  Yes [name], FNP  glucose blood (ONE TOUCH ULTRA TEST) test strip USE TO TEST 3 TIMES A DAY 06/11/18  Yes [name], FNP  hydrALAZINE (APRESOLINE) 100 MG tablet Take 1 tablet (100 mg total) by mouth 2 (two) times daily. 08/22/17  Yes Scot Jun, FNP  ketotifen (ZADITOR) 0.025 % ophthalmic solution Place 1 drop into both eyes 2 (two) times daily. 03/30/16  Yes Mabe, Shanon Brow, NP  omeprazole (PRILOSEC) 20 MG capsule Take 1 capsule (20 mg total) by mouth daily. Has to be taken at the same time with Harvoni FASTED 12/13/17  Yes Scot Jun, FNP  pravastatin (PRAVACHOL) 80 MG tablet Take 1 tablet (80  mg total) by mouth daily. 12/13/17  Yes Scot Jun, FNP  timolol (TIMOPTIC) 0.5 % ophthalmic solution Place 1 drop into the left eye at bedtime.  05/23/17  Yes [provider]  Travoprost, BAK Free, (TRAVATAN) 0.004 % SOLN ophthalmic solution Place 1 drop into both eyes at bedtime.   Yes [provider]  traZODone (DESYREL) 50 MG tablet Take 2 tablets every night at bedtime for sleep 01/16/19  Yes Kathe Becton M, FNP  Insulin Glargine (LANTUS SOLOSTAR) 100 UNIT/ML Solostar Pen Inject 20 Units into the skin daily at 10 pm. Patient not taking: Reported on 05/02/2019 12/13/17   Scot Jun, FNP  Insulin Pen Needle 29G X 12MM MISC E11.9 Dx Code Use once at bedtime with each nightly insulin 03/11/17   Scot Jun, FNP  Lancets [facility] ULTRASOFT) lancets Use as instructed 04/30/16   Micheline Chapman, NP    Family History Family History  Problem Relation Age of Onset  . High blood pressure Mother   . Diabetes Mother   . Thyroid disease Mother   . Diabetes Father   . High blood pressure Father     Social History Social History   Tobacco Use  . Smoking  status: Current Every Day Smoker    Packs/day: 0.25    Types: Cigarettes  . Smokeless tobacco: Never Used  . Tobacco comment: trying to quit  Substance Use Topics  . Alcohol use: Yes    Comment: 2-3 beers weekly  . Drug use: Not Currently    Types: Marijuana, Cocaine    Comment: quit 3 months ago     Allergies   Acyclovir and related   Review of Systems Review of Systems  Constitutional: Negative for chills and fever.  Respiratory: Negative for cough, chest tightness and shortness of breath.   Cardiovascular: Negative for chest pain and leg swelling.  Gastrointestinal: Negative for abdominal pain, diarrhea, nausea and vomiting.  Genitourinary: Negative for dysuria and vaginal pain.  Musculoskeletal: Negative for back pain and neck pain.  Neurological: Negative for weakness and headaches.  All other systems reviewed and are negative.    Physical Exam Updated Vital Signs BP (!) 183/97   Pulse 97   Temp 98.6 F (37 C) (Oral)   Resp (!) 21   SpO2 100%   Physical Exam Vitals signs and nursing note reviewed.  Constitutional:      General: She is not in acute distress.    Appearance: She is well-developed. She is not diaphoretic.  HENT:     Head: Normocephalic and atraumatic.     Mouth/Throat:     Mouth: Mucous membranes are moist.  Eyes:     General: No scleral icterus.       Right eye: No discharge.        Left eye: No discharge.     Conjunctiva/sclera: Conjunctivae normal.  Neck:     Musculoskeletal: Normal range of motion and neck supple.  Cardiovascular:     Rate and Rhythm: Normal rate and regular rhythm.     Pulses: Normal pulses.     Heart sounds: Normal heart sounds. No murmur.     Comments: Palpable thrill over fistula on LUE Pulmonary:     Effort: Pulmonary effort is normal. No respiratory distress.     Breath sounds: No stridor.  Abdominal:     General: Abdomen is flat. There is no distension.     Tenderness: There is no abdominal tenderness.  There is no guarding.  Musculoskeletal:        General: No deformity.     Right lower leg: No edema.     Left lower leg: No edema.  Skin:    General: Skin is warm and dry.  Neurological:     General: No focal deficit present.     Mental Status: She is alert and oriented to person, place, and time.     Motor: No abnormal muscle tone.  Psychiatric:        Attention and Perception: Attention normal.        Mood and Affect: Mood is depressed.        Thought Content: Thought content does not include homicidal or suicidal ideation. Thought content does not include homicidal or suicidal plan.      ED Treatments / Results  Labs (all labs ordered are listed, but only abnormal results are displayed) Labs Reviewed  BASIC METABOLIC PANEL - Abnormal; Notable for the following components:      Result Value   CO2 14 (*)    Glucose, Bld 100 (*)    BUN 55 (*)    Creatinine, Ser 10.54 (*)    GFR calc non Af Amer 4 (*)    GFR calc Af Amer 4 (*)    All other components within normal limits  CBC - Abnormal; Notable for the following components:   HCT 48.1 (*)    MCV 101.3 (*)    All other components within normal limits  URINALYSIS, ROUTINE W REFLEX MICROSCOPIC - Abnormal; Notable for the following components:   APPearance HAZY (*)    Glucose, UA 50 (*)    Hgb urine dipstick MODERATE (*)    Ketones, ur 5 (*)    Protein, ur >=300 (*)    Bacteria, UA RARE (*)    All other components within normal limits  HEPATIC FUNCTION PANEL - Abnormal; Notable for the following components:   Total Protein 9.8 (*)    All other components within normal limits  RAPID URINE DRUG SCREEN, HOSP PERFORMED - Abnormal; Notable for the following components:   Tetrahydrocannabinol POSITIVE (*)    All other components within normal limits  URINE CULTURE  CBG MONITORING, ED    EKG None  Radiology Dg Chest Port 1 View  Result Date: 05/02/2019 CLINICAL DATA:  Hypertension.  Missed dialysis. EXAM: PORTABLE  CHEST 1 VIEW COMPARISON:  Multiple previous chest x-rays. The most recent is 07/12/2018 FINDINGS: The cardiac silhouette, mediastinal and hilar contours are normal and stable. There is mild tortuosity and calcification of the thoracic aorta. The lungs are clear of an acute process. No infiltrates, edema or effusions. No worrisome pulmonary lesions. The bony thorax is intact. IMPRESSION: No acute cardiopulmonary findings. Electronically Signed   By: Marijo Sanes M.D.   On: 05/02/2019 11:19    Procedures Procedures (including critical care time)  Medications Ordered in ED Medications  sodium chloride flush (NS) 0.9 % injection 3 mL (has no administration in time range)  amLODipine (NORVASC) tablet 10 mg (10 mg Oral Given 05/02/19 1139)  hydrALAZINE (APRESOLINE) tablet 100 mg (100 mg Oral Given 05/02/19 1138)     Initial Impression / Assessment and Plan / ED Course  I have reviewed the triage vital signs and the nursing notes.  Pertinent labs & imaging results that were available during my care of the patient were reviewed by me and considered in my medical decision making (see chart for details).  Clinical Course as of May 01 1442  Wed May 02, 2019  1311 Patient does not meet inpatient criteria.    [EH]    Clinical Course User Index [EH] Lorin Glass, PA-C      Patient presents today for evaluation after missing multiple dialysis sessions.  She missed 3 sessions of she went on vacation.  She states that she was having low mood and was at a point where she felt like she did not care if she lived or died, and missed approximately 4 dialysis sessions.  Chest x-ray obtained without evidence of significant fluid overload.  She was initially markedly hypertensive while in the emergency room, however admitted to not taking her home medicines.  She was given her p.o. home medications after which her blood pressure improved significantly.  She did not have any headache, there he had not  obtained.  She denied any chest pain.  Do not suspect ACS.  EKG obtained without evidence of acute ischemia.  Does not have any chest pain.  Her potassium is not elevated at 4.5.  Clinically she does not appear fluid overloaded, states that she feels well and does not have other symptoms.    Urine appears consistent with her baseline with hematuria and proteinuria.  Creatinine is significantly elevated at 10.54 in line with her missing dialysis in the setting of ESRD.  Do not suspect that her hematuria or proteinuria is related to her hypertension, as this appears to be her baseline.    Blood pressure improved to 160/89.  As patient reported depressive symptoms TTS was consulted who recommended outpatient treatment, does not meet inpatient criteria.  She feels safe going home today, is excited to see her grandchild.   This patient was seen as a shared visit with Dr. Vanita Panda.    Return precautions were discussed with patient who states their understanding.  At the time of discharge patient denied any unaddressed complaints or concerns.  Patient is agreeable for discharge home.    Final Clinical Impressions(s) / ED Diagnoses   Final diagnoses:  Dialysis patient, noncompliant [facility])  Depression, unspecified depression type    ED Discharge Orders    None       Ollen Gross 05/02/19 1449    Carmin Muskrat, MD 05/05/19 [phone]

## 2019-05-02 NOTE — BH Assessment (Signed)
Tele Assessment Note   Patient Name: Tammy Moses MRN: [id number] Referring Physician: EDP Location of Patient: [facility] Location of Provider: Behavioral Health TTS Department  AMAN BATLEY is a 53 y.o. female who presented to [facility] on voluntary basis with complaint of despondency.  Pt had been referred to [facility] by her outpatient dialysis clinic.  Per report, Pt had not been consistent with dialysis treatment and had missed three or four appointments.  She was advised that she would need to go to [facility] before she could receive treatment again.  While at [facility], she endorsed despondency.  Pt lives alone in [place].  She is legally blind and receives disability pay.  Pt does not receive outpatient psychiatric services.  Pt reported that she has been despondent over about two weeks because she is approaching the anniversary of her daughter's death.  Pt reported that she stopped going to dialysis and missed 2-3 appointments because she ''wanted to hurt herself.'' Pt now denies suicidal ideation.  Pt reported that she attempted suicide about 40 years ago by overdosing on aspirin (suicide attempt due to struggle in family).  Pt endorsed the following symptoms:  Despondency; disturbed sleep; poor appetite; feelings of worthlessness and hopelessness; fatigue; isolation.  Pt denied hallucination, homicidal ideation, and self-injurious behavior, and substance use concerns.  Pt denied past inpatient psychiatric care.  Pt expressed interest in outpatient services.  During assessment, Pt presented as alert and oriented.  Pt was dressed in street clothes, and she appeared appropriately groomed.  Pt's mood was sad.  Affect was mood-congruent.  Pt's speech was normal in rate, rhythm, and volume.  Thought processes were within normal range, and thought content was logical and goal-oriented.  There was no evidence of delusion.  Pt's memory and concentration were intact.  Insight, judgment, and impulse control were  fair.  L. Marcello Moores, FNP, was present for the assessment, and she also spoke with Pt.  Per L. Marcello Moores, FNP, Pt does not meet inpatient criteria and should be discharged with outpatient resources.  Diagnosis: Major Depressive Disorder, Recurrent, Severe w/o psychotic features  Past Medical History:  Past Medical History:  Diagnosis Date  . Anemia   . Anxiety   . Blind right eye   . Constipation, chronic   . Dental caries   . Diabetes mellitus   . Diabetic neuropathy ([place])   . Diabetic retinopathy   . GERD (gastroesophageal reflux disease)   . Glaucoma   . H. pylori infection   . Hepatitis C carrier ([place])   . High risk sexual behavior   . Hyperlipidemia   . Hypertension   . Hypoglycemia 07/12/2017  . Insomnia   . Microalbuminuria   . Nonspecific elevation of levels of transaminase or lactic acid dehydrogenase (LDH)   . Tobacco dependence   . Vitamin D deficiency     Past Surgical History:  Procedure Laterality Date  . ABDOMINAL HYSTERECTOMY  04/30/2009   PARTIAL  . COLONOSCOPY    . DIALYSIS FISTULA CREATION Left 06/2017  . ESOPHAGOGASTRODUODENOSCOPY      Family History:  Family History  Problem Relation Age of Onset  . High blood pressure Mother   . Diabetes Mother   . Thyroid disease Mother   . Diabetes Father   . High blood pressure Father     Social History:  reports that she has been smoking cigarettes. She has been smoking about 0.25 packs per day. She has never used smokeless tobacco. She reports current alcohol use. She reports  previous drug use. Drugs: Marijuana and Cocaine.  Additional Social History:  Alcohol / Drug Use Pain Medications: See MAR Prescriptions: See MAR Over the Counter: See MAR History of alcohol / drug use?: Yes  CIWA: CIWA-Ar BP: (!) 128/93 Pulse Rate: (!) 101 COWS:    Allergies:  Allergies  Allergen Reactions  . Acyclovir And Related Itching    Home Medications: (Not in a hospital admission)   OB/GYN Status:  No LMP  recorded. Patient has had a hysterectomy.  General Assessment Data TTS Assessment: In system Is this a Tele or Face-to-Face Assessment?: Tele Assessment Is this an Initial Assessment or a Re-assessment for this encounter?: Initial Assessment Language Other than English: No Living Arrangements: Other (Comment) What gender do you identify as?: Female Marital status: Divorced Pregnancy Status: No Living Arrangements: Alone Can pt return to current living arrangement?: Yes Admission Status: Voluntary Is patient capable of signing voluntary admission?: Yes Referral Source: Self/Family/Friend Insurance type: [facility]     Crisis Care Plan Living Arrangements: Alone Name of Psychiatrist: None Name of Therapist: None  Education Status Is patient currently in school?: No Is the patient employed, unemployed or receiving disability?: Receiving disability income  Risk to self with the past 6 months Suicidal Ideation: No-Not Currently/Within Last 6 Months Has patient been a risk to self within the past 6 months prior to admission? : Yes Suicidal Intent: No-Not Currently/Within Last 6 Months Has patient had any suicidal intent within the past 6 months prior to admission? : Yes Is patient at risk for suicide?: No Suicidal Plan?: No-Not Currently/Within Last 6 Months Has patient had any suicidal plan within the past 6 months prior to admission? : Other (comment)(See notes) Access to Means: No What has been your use of drugs/alcohol within the last 12 months?: Denied Previous Attempts/Gestures: Yes How many times?: 1 Triggers for Past Attempts: Family contact Intentional Self Injurious Behavior: Damaging Comment - Self Injurious Behavior: Pt stopped taking dialysis tx(''I guess I wanted to die'') Family Suicide History: Unknown Recent stressful life event(s): Other (Comment)(Pending anniversary of daughter's death) Persecutory voices/beliefs?: No Depression: Yes Depression Symptoms:  Despondent, Insomnia, Feeling worthless/self pity, Loss of interest in usual pleasures, Fatigue, Isolating Substance abuse history and/or treatment for substance abuse?: Yes Suicide prevention information given to non-admitted patients: Not applicable  Risk to Others within the past 6 months Homicidal Ideation: No Does patient have any lifetime risk of violence toward others beyond the six months prior to admission? : No Thoughts of Harm to Others: No Current Homicidal Intent: No Current Homicidal Plan: No Access to Homicidal Means: No History of harm to others?: No Assessment of Violence: None Noted Does patient have access to weapons?: No Criminal Charges Pending?: No Does patient have a court date: No Is patient on probation?: No  Psychosis Hallucinations: None noted Delusions: None noted  Mental Status Report Appearance/Hygiene: Other (Comment), Unremarkable(Street clothes) Eye Contact: Good Motor Activity: Freedom of movement, Unremarkable Speech: Logical/coherent Level of Consciousness: Alert Mood: Sad, Preoccupied Affect: Appropriate to circumstance Anxiety Level: None Thought Processes: Coherent, Relevant Judgement: Partial Orientation: Person, Place, Time, Situation Obsessive Compulsive Thoughts/Behaviors: None  Cognitive Functioning Concentration: Normal Memory: Recent Intact, Remote Intact Is patient IDD: No Insight: Fair Impulse Control: Fair Appetite: Poor Have you had any weight changes? : Loss Amount of the weight change? (lbs): 5 lbs(over a week) Sleep: Decreased Total Hours of Sleep: (Mixed) Vegetative Symptoms: None  ADLScreening [facility] Assessment Services) Patient's cognitive ability adequate to safely complete daily activities?: Yes  Patient able to express need for assistance with ADLs?: Yes Independently performs ADLs?: Yes (appropriate for developmental age)  Prior Inpatient Therapy Prior Inpatient Therapy: No  Prior Outpatient  Therapy Prior Outpatient Therapy: No Does patient have an ACCT team?: No Does patient have Intensive In-House Services?  : No Does patient have [facility] services? : No Does patient have P4CC services?: No  ADL Screening (condition at time of admission) Patient's cognitive ability adequate to safely complete daily activities?: Yes Is the patient deaf or have difficulty hearing?: No Does the patient have difficulty seeing, even when wearing glasses/contacts?: Yes(Legally blind, per self-report) Does the patient have difficulty concentrating, remembering, or making decisions?: No Patient able to express need for assistance with ADLs?: Yes Does the patient have difficulty dressing or bathing?: No Independently performs ADLs?: Yes (appropriate for developmental age) Does the patient have difficulty walking or climbing stairs?: No Weakness of Legs: None Weakness of Arms/Hands: None     Therapy Consults (therapy consults require a physician order) PT Evaluation Needed: No OT Evalulation Needed: No SLP Evaluation Needed: No Abuse/Neglect Assessment (Assessment to be complete while patient is alone) Abuse/Neglect Assessment Can Be Completed: Yes Physical Abuse: Denies Verbal Abuse: Denies Sexual Abuse: Denies Exploitation of patient/patient's resources: Denies Self-Neglect: Denies Values / Beliefs Cultural Requests During Hospitalization: None Spiritual Requests During Hospitalization: None Consults Spiritual Care Consult Needed: No Social Work Consult Needed: No Regulatory affairs officer (For Healthcare) Does Patient Have a Medical Advance Directive?: No          Disposition:  Disposition Initial Assessment Completed for this Encounter: Yes Disposition of Patient: Discharge(Per L. Marcello Moores, FNP, Pt does not meet inpt criteria)  This service was provided via telemedicine using a 2-way, interactive audio and video technology.  Names of all persons participating in this telemedicine  service and their role in this encounter. Name: Clemencia, Helzer Role: Pt             Marlowe Aschoff 05/02/2019 12:44 PM

## 2019-05-04 LAB — URINE CULTURE: Culture: NO GROWTH

## 2019-07-05 ENCOUNTER — Telehealth: Payer: Self-pay

## 2019-07-05 MED ORDER — GLUCOSE BLOOD VI STRP: ORAL_STRIP | 11 refills | Status: DC

## 2019-07-05 NOTE — Telephone Encounter (Signed)
Called and left a message to call back. Test strips reordered.

## 2019-07-10 ENCOUNTER — Other Ambulatory Visit: Payer: Self-pay

## 2019-07-10 ENCOUNTER — Telehealth: Payer: Self-pay | Admitting: Family Medicine

## 2019-07-10 ENCOUNTER — Ambulatory Visit: Payer: Medicare Other | Admitting: Podiatry

## 2019-07-10 MED ORDER — GLUCOSE BLOOD VI STRP: ORAL_STRIP | 11 refills | Status: DC

## 2019-07-10 MED ORDER — ONETOUCH ULTRASOFT LANCETS MISC: 12 refills | Status: DC

## 2019-07-10 NOTE — Telephone Encounter (Signed)
tar

## 2019-07-10 NOTE — Telephone Encounter (Signed)
Sent in the pharmacy

## 2019-07-13 ENCOUNTER — Telehealth: Payer: Self-pay | Admitting: Family Medicine

## 2019-07-13 NOTE — Telephone Encounter (Signed)
Called and spoke with [facility] on [address] they have her strips and lancets ready for pt to pick up . Call and l/m for pt to inform her of this , also let her know if she used a different pharmacy to call us back to let us know.

## 2019-07-15 ENCOUNTER — Encounter: Payer: Self-pay | Admitting: Internal Medicine

## 2019-07-17 ENCOUNTER — Telehealth: Payer: Self-pay | Admitting: Podiatry

## 2019-07-17 ENCOUNTER — Other Ambulatory Visit: Payer: Self-pay

## 2019-07-17 ENCOUNTER — Ambulatory Visit (INDEPENDENT_AMBULATORY_CARE_PROVIDER_SITE_OTHER): Payer: Medicare Other | Admitting: Family Medicine

## 2019-07-17 VITALS — BP 111/70 | HR 78 | Temp 98.0°F | Ht 61.0 in | Wt 143.2 lb

## 2019-07-17 DIAGNOSIS — Z992 Dependence on renal dialysis: Secondary | ICD-10-CM

## 2019-07-17 DIAGNOSIS — G63 Polyneuropathy in diseases classified elsewhere: Secondary | ICD-10-CM

## 2019-07-17 DIAGNOSIS — I721 Aneurysm of artery of upper extremity: Secondary | ICD-10-CM

## 2019-07-17 DIAGNOSIS — Z09 Encounter for follow-up examination after completed treatment for conditions other than malignant neoplasm: Secondary | ICD-10-CM

## 2019-07-17 DIAGNOSIS — N186 End stage renal disease: Secondary | ICD-10-CM

## 2019-07-17 DIAGNOSIS — Z23 Encounter for immunization: Secondary | ICD-10-CM

## 2019-07-17 DIAGNOSIS — Z1211 Encounter for screening for malignant neoplasm of colon: Secondary | ICD-10-CM

## 2019-07-17 DIAGNOSIS — N185 Chronic kidney disease, stage 5: Secondary | ICD-10-CM | POA: Diagnosis not present

## 2019-07-17 DIAGNOSIS — J45909 Unspecified asthma, uncomplicated: Secondary | ICD-10-CM | POA: Diagnosis not present

## 2019-07-17 DIAGNOSIS — E1122 Type 2 diabetes mellitus with diabetic chronic kidney disease: Secondary | ICD-10-CM | POA: Diagnosis not present

## 2019-07-17 DIAGNOSIS — Z1239 Encounter for other screening for malignant neoplasm of breast: Secondary | ICD-10-CM

## 2019-07-17 DIAGNOSIS — I10 Essential (primary) hypertension: Secondary | ICD-10-CM

## 2019-07-17 DIAGNOSIS — J449 Chronic obstructive pulmonary disease, unspecified: Secondary | ICD-10-CM

## 2019-07-17 LAB — POCT URINALYSIS DIPSTICK
Glucose, UA: NEGATIVE
Ketones, UA: NEGATIVE
Leukocytes, UA: NEGATIVE
Nitrite, UA: NEGATIVE
Protein, UA: NEGATIVE
Spec Grav, UA: >=1.03 — AB (ref 1.010–1.025)
Urobilinogen, UA: 0.2 E.U./dL
pH, UA: 5.5 (ref 5.0–8.0)

## 2019-07-17 LAB — POCT GLYCOSYLATED HEMOGLOBIN (HGB A1C)
HbA1c POC (<> result, manual entry): 5.2 % (ref 4.0–5.6)
HbA1c, POC (controlled diabetic range): 5.2 % (ref 0.0–7.0)
HbA1c, POC (prediabetic range): 5.2 % — AB (ref 5.7–6.4)
Hemoglobin A1C: 5.2 % (ref 4.0–5.6)

## 2019-07-17 LAB — POCT GLUCOSE (DEVICE FOR HOME USE)
Glucose Fasting, POC: 135 mg/dL — AB (ref 70–99)
POC Glucose: 135 mg/dl — AB (ref 70–99)

## 2019-07-17 MED ORDER — GABAPENTIN 600 MG PO TABS: 600.0000 mg | ORAL_TABLET | Freq: Two times a day (BID) | ORAL | 1 refills | Status: DC

## 2019-07-17 MED ORDER — ALBUTEROL SULFATE HFA 108 (90 BASE) MCG/ACT IN AERS: 2.0000 | INHALATION_SPRAY | RESPIRATORY_TRACT | 11 refills | Status: DC | PRN

## 2019-07-17 MED ORDER — GLUCOSE BLOOD VI STRP: ORAL_STRIP | 11 refills | Status: DC

## 2019-07-17 NOTE — Progress Notes (Signed)
Patient [facility]   Established Patient Office Visit  Subjective:  Patient ID: Tammy Moses, female    DOB: 09/21/1966  Age: 53 y.o. MRN: [id number]  CC:  Chief Complaint  Patient presents with   Follow-up    need a referral appt for appt.     HPI Tammy Moses is a 53 year old female who presents for Follow Up today.   Past Medical History:  Diagnosis Date   Anemia    Anxiety    Blind right eye    Constipation, chronic    Dental caries    Diabetes mellitus    Diabetic neuropathy ([place])    Diabetic retinopathy    GERD (gastroesophageal reflux disease)    Glaucoma    H. pylori infection    Hepatitis C carrier ([place])    High risk sexual behavior    Hyperlipidemia    Hypertension    Hypoglycemia 07/12/2017   Insomnia    Microalbuminuria    Nonspecific elevation of levels of transaminase or lactic acid dehydrogenase (LDH)    Tobacco dependence    Vitamin D deficiency    Current Status: Since her last office visit, she is doing well with no complaints. She is requesting Pap Smear today. Last Pap was 7 years ago. She last had Colonoscopy in 2010, by Dr. Carlean Purl at [facility]. Her last Mammogram 02/2019. Her most recent normal range of preprandial blood glucose levels have been between 136-144. She has seen low range of 85 and high of 310 since his last office visit. She denies fatigue, frequent urination, blurred vision, excessive hunger, excessive thirst, weight gain, weight loss, and poor wound healing. She continues to check her feet regularly. She has c/o aneurysm located in upper left arm near graft and would like further evaluation of this area. She continues dialysis treatments 3 times weekly.  She denies visual changes, chest pain, cough, shortness of breath, heart palpitations, and falls. She has occasional headaches and dizziness with position changes. Denies severe headaches, confusion, seizures, double  vision, and blurred vision, nausea and vomiting.  She denies fevers, chills, fatigue, recent infections, weight loss, and night sweats. No reports of GI problems such as diarrhea, and constipation. She has no reports of blood in stools, dysuria and hematuria. No depression or anxiety reported today. She denies pain today.   Past Surgical History:  Procedure Laterality Date   ABDOMINAL HYSTERECTOMY  04/30/2009   PARTIAL   COLONOSCOPY     DIALYSIS FISTULA CREATION Left 06/2017   ESOPHAGOGASTRODUODENOSCOPY      Family History  Problem Relation Age of Onset   High blood pressure Mother    Diabetes Mother    Thyroid disease Mother    Diabetes Father    High blood pressure Father     Social History   Socioeconomic History   Marital status: Divorced    Spouse name: Not on file   Number of children: 2   Years of education: 11   Highest education level: Not on file  Occupational History    Employer: DISABLED    Comment: Disabled  Scientist, product/process development strain: Not on file   Food insecurity    Worry: Not on file    Inability: Not on file   Transportation needs    Medical: Not on file    Non-medical: Not on file  Tobacco Use   Smoking status: Current Every Day Smoker  Packs/day: 0.25    Types: Cigarettes   Smokeless tobacco: Never Used   Tobacco comment: trying to quit  Substance and Sexual Activity   Alcohol use: Yes    Comment: 2-3 beers weekly   Drug use: Not Currently    Types: Marijuana, Cocaine    Comment: quit 3 months ago   Sexual activity: Not Currently  Lifestyle   Physical activity    Days per week: Not on file    Minutes per session: Not on file   Stress: Not on file  Relationships   Social connections    Talks on phone: Not on file    Gets together: Not on file    Attends religious service: Not on file    Active member of club or organization: Not on file    Attends meetings of clubs or organizations: Not on file     Relationship status: Not on file   Intimate partner violence    Fear of current or ex partner: Not on file    Emotionally abused: Not on file    Physically abused: Not on file    Forced sexual activity: Not on file  Other Topics Concern   Not on file  Social History Narrative   Patient lives at home alone.   Disabled.   Right handed.   Education 11 th grade.   Caffeine three cups of coffee daily.   No psychiatrist    Outpatient Medications Prior to Visit  Medication Sig Dispense Refill   amLODipine (NORVASC) 10 MG tablet Take 10 mg by mouth daily.  3   atropine 1 % ophthalmic solution Place 1 drop into both eyes 2 (two) times daily.     blood glucose meter kit and supplies KIT Dispense based on patient and insurance preference. Use up to four times daily as directed. (FOR ICD-10 E.11.9) 1 each 0   buPROPion (WELLBUTRIN SR) 150 MG 12 hr tablet Take 1 tablet (150 mg total) by mouth 2 (two) times daily. 60 tablet 2   calcium acetate (PHOSLO) 667 MG capsule Take 2,001 mg by mouth 3 (three) times daily with meals.     Cholecalciferol (VITAMIN D-3) 1000 units CAPS Take 2 capsules (2,000 Units total) by mouth daily. 90 capsule 3   furosemide (LASIX) 40 MG tablet Take 40 mg by mouth daily.  3   hydrALAZINE (APRESOLINE) 100 MG tablet Take 1 tablet (100 mg total) by mouth 2 (two) times daily. 60 tablet 0   Insulin Glargine (LANTUS SOLOSTAR) 100 UNIT/ML Solostar Pen Inject 20 Units into the skin daily at 10 pm. 5 pen PRN   Insulin Pen Needle 29G X 12MM MISC E11.9 Dx Code Use once at bedtime with each nightly insulin 100 each 3   ketotifen (ZADITOR) 0.025 % ophthalmic solution Place 1 drop into both eyes 2 (two) times daily. 5 mL 0   Lancets (ONETOUCH ULTRASOFT) lancets Use as instructed 100 each 12   pravastatin (PRAVACHOL) 80 MG tablet Take 1 tablet (80 mg total) by mouth daily. 15 tablet 0   timolol (TIMOPTIC) 0.5 % ophthalmic solution Place 1 drop into the left eye at  bedtime.   11   Travoprost, BAK Free, (TRAVATAN) 0.004 % SOLN ophthalmic solution Place 1 drop into both eyes at bedtime.     traZODone (DESYREL) 50 MG tablet Take 2 tablets every night at bedtime for sleep 60 tablet 3   albuterol (PROVENTIL HFA;VENTOLIN HFA) 108 (90 Base) MCG/ACT inhaler Inhale 2 puffs into the  lungs every 4 (four) hours as needed for shortness of breath. For shortness of breath 1 Inhaler 6   gabapentin (NEURONTIN) 600 MG tablet Take 1 tablet (600 mg total) by mouth 2 (two) times daily. 180 tablet 1   glucose blood (ONE TOUCH ULTRA TEST) test strip USE TO TEST 3 TIMES A DAY 100 each 11   omeprazole (PRILOSEC) 20 MG capsule Take 1 capsule (20 mg total) by mouth daily. Has to be taken at the same time with Harvoni FASTED (Patient not taking: Reported on 07/17/2019) 30 capsule 11   No facility-administered medications prior to visit.     Allergies  Allergen Reactions   Acyclovir And Related Itching    ROS Review of Systems  Constitutional: Negative.   HENT: Negative.   Eyes: Positive for visual disturbance.  Respiratory: Negative.   Cardiovascular: Negative.   Gastrointestinal: Negative.   Endocrine: Negative.   Genitourinary: Negative.   Musculoskeletal: Negative.   Skin: Negative.   Allergic/Immunologic: Negative.   Neurological: Positive for dizziness (occasional ) and headaches (occasional ).  Hematological: Negative.   Psychiatric/Behavioral: Negative.    Objective:    Physical Exam  Constitutional: She is oriented to person, place, and time. She appears well-developed and well-nourished.  HENT:  Head: Normocephalic and atraumatic.  Eyes: Right eye exhibits chemosis. Left eye exhibits chemosis. Left conjunctiva has a hemorrhage.  Chronic visual problems   Neck: Normal range of motion. Neck supple.  Cardiovascular: Normal rate, regular rhythm, normal heart sounds and intact distal pulses.  Pulmonary/Chest: Effort normal and breath sounds normal.    Abdominal: Soft. Bowel sounds are normal.    Musculoskeletal: Normal range of motion.  Neurological: She is alert and oriented to person, place, and time. She has normal reflexes.  Skin: Skin is warm and dry.  Psychiatric: She has a normal mood and affect. Her behavior is normal. Judgment and thought content normal.  Nursing note and vitals reviewed.   BP 111/70 (BP Location: Right Arm, Patient Position: Sitting, Cuff Size: Normal)    Pulse 78    Temp 98 F (36.7 C)    Ht 5' 1"  (1.549 m)    Wt 143 lb 3.2 oz (65 kg)    BMI 27.06 kg/m  Wt Readings from Last 3 Encounters:  07/17/19 143 lb 3.2 oz (65 kg)  01/16/19 146 lb (66.2 kg)  07/22/18 143 lb 8.3 oz (65.1 kg)     Health Maintenance Due  Topic Date Due   PNEUMOCOCCAL POLYSACCHARIDE VACCINE AGE 55-64 HIGH RISK  07/20/1966   PAP SMEAR-Modifier  07/20/1985   TETANUS/TDAP  10/25/2014   OPHTHALMOLOGY EXAM  05/24/2019   INFLUENZA VACCINE  05/26/2019    There are no preventive care reminders to display for this patient.  Lab Results  Component Value Date   TSH 2.690 12/13/2017   Lab Results  Component Value Date   WBC 7.2 05/02/2019   HGB 14.9 05/02/2019   HCT 48.1 (H) 05/02/2019   MCV 101.3 (H) 05/02/2019   PLT 293 05/02/2019   Lab Results  Component Value Date   NA 137 05/02/2019   K 4.5 05/02/2019   CO2 14 (L) 05/02/2019   GLUCOSE 100 (H) 05/02/2019   BUN 55 (H) 05/02/2019   CREATININE 10.54 (H) 05/02/2019   BILITOT 0.7 05/02/2019   ALKPHOS 68 05/02/2019   AST 25 05/02/2019   ALT 16 05/02/2019   PROT 9.8 (H) 05/02/2019   ALBUMIN 4.5 05/02/2019   CALCIUM 9.1 05/02/2019   ANIONGAP  15 05/02/2019   Lab Results  Component Value Date   CHOL 339 (H) 09/07/2016   Lab Results  Component Value Date   HDL 87 09/07/2016   Lab Results  Component Value Date   LDLCALC 213 (H) 09/07/2016   Lab Results  Component Value Date   TRIG 193 (H) 09/07/2016   Lab Results  Component Value Date   CHOLHDL 3.9  09/07/2016   Lab Results  Component Value Date   HGBA1C 5.2 07/17/2019   HGBA1C 5.2 07/17/2019   HGBA1C 5.2 (A) 07/17/2019   HGBA1C 5.2 07/17/2019    Assessment & Plan:   1. Type 2 diabetes mellitus with stage 5 chronic kidney disease (HCC) Hgb A1c is within normal range. She will continue to decrease foods/beverages high in sugars and carbs and follow Heart Healthy or DASH diet. Increase physical activity to at least 30 minutes cardio exercise daily.  - POCT Urinalysis Dipstick - HgB A1c - POCT Glucose (Device for Home Use) - glucose blood (ONE TOUCH ULTRA TEST) test strip; USE TO TEST 3 TIMES A DAY  Dispense: 100 each; Refill: 11  2. ESRD (end stage renal disease) on dialysis [facility]) Continue dialysis treatments on Mon, Tues, and Fri.   3. Aneurysm artery, upper extremity ([place]) - Ambulatory referral to Vascular Surgery  4. Moderate asthma without complication, unspecified whether persistent - albuterol (VENTOLIN HFA) 108 (90 Base) MCG/ACT inhaler; Inhale 2 puffs into the lungs every 4 (four) hours as needed for shortness of breath. For shortness of breath  Dispense: 8 g; Refill: 11  5. Chronic obstructive pulmonary disease, unspecified COPD type ([place]) No signs or symptoms of respiratory distress noted or reported today. - albuterol (VENTOLIN HFA) 108 (90 Base) MCG/ACT inhaler; Inhale 2 puffs into the lungs every 4 (four) hours as needed for shortness of breath. For shortness of breath  Dispense: 8 g; Refill: 11  6. Polyneuropathy associated with underlying disease ([place]) - gabapentin (NEURONTIN) 600 MG tablet; Take 1 tablet (600 mg total) by mouth 2 (two) times daily.  Dispense: 180 tablet; Refill: 1  7. Essential hypertension The current medical regimen is effective; blood pressure is stable at 111/70 today; continue present plan and medications as prescribed. She will continue to decrease high sodium intake, excessive alcohol intake, increase potassium intake, smoking cessation,  and increase physical activity of at least 30 minutes of cardio activity daily. She will continue to follow Heart Healthy or DASH diet.  8. Screening for breast cancer - MM Digital Diagnostic Bilat; Future  9. Screening for colon cancer - Ambulatory referral to Gastroenterology  10. Need for influenza vaccination - Flu Vaccine QUAD 6+ mos PF IM (Fluarix Quad PF)  11. Follow up She will follow up for Pap Smear. She will follow up for office visit in 6 months.   Meds ordered this encounter  Medications   albuterol (VENTOLIN HFA) 108 (90 Base) MCG/ACT inhaler    Sig: Inhale 2 puffs into the lungs every 4 (four) hours as needed for shortness of breath. For shortness of breath    Dispense:  8 g    Refill:  11   gabapentin (NEURONTIN) 600 MG tablet    Sig: Take 1 tablet (600 mg total) by mouth 2 (two) times daily.    Dispense:  180 tablet    Refill:  1   glucose blood (ONE TOUCH ULTRA TEST) test strip    Sig: USE TO TEST 3 TIMES A DAY    Dispense:  100 each  Refill:  11    Orders Placed This Encounter  Procedures   MM Digital Diagnostic Bilat   Flu Vaccine QUAD 6+ mos PF IM (Fluarix Quad PF)   Ambulatory referral to Gastroenterology   Ambulatory referral to Vascular Surgery   POCT Urinalysis Dipstick   HgB A1c   POCT Glucose (Device for Home Use)     Referral Orders     Ambulatory referral to Gastroenterology     Ambulatory referral to Vascular Surgery   Kathe Becton,  MSN, FNP-BC [facility] [address] [place], [place] [address] [phone]- fax   Problem List Items Addressed This Visit      Cardiovascular and Mediastinum   Essential hypertension     Respiratory   Asthma   Relevant Medications   albuterol (VENTOLIN HFA) 108 (90 Base) MCG/ACT inhaler   COPD (chronic obstructive pulmonary disease) (HCC)   Relevant Medications   albuterol (VENTOLIN HFA) 108 (90  Base) MCG/ACT inhaler     Nervous and Auditory   Peripheral neuropathy   Relevant Medications   gabapentin (NEURONTIN) 600 MG tablet     Genitourinary   ESRD (end stage renal disease) on dialysis ([place])    Other Visit Diagnoses    Type 2 diabetes mellitus with stage 5 chronic kidney disease ([place])    -  Primary   Relevant Medications   glucose blood (ONE TOUCH ULTRA TEST) test strip   Other Relevant Orders   POCT Urinalysis Dipstick (Completed)   HgB A1c (Completed)   POCT Glucose (Device for Home Use) (Completed)   Aneurysm artery, upper extremity ([place])       Relevant Orders   Ambulatory referral to Vascular Surgery   Screening for breast cancer       Relevant Orders   MM Digital Diagnostic Bilat   Screening for colon cancer       Relevant Orders   Ambulatory referral to Gastroenterology   Need for influenza vaccination       Relevant Orders   Flu Vaccine QUAD 6+ mos PF IM (Fluarix Quad PF)   Follow up          Meds ordered this encounter  Medications   albuterol (VENTOLIN HFA) 108 (90 Base) MCG/ACT inhaler    Sig: Inhale 2 puffs into the lungs every 4 (four) hours as needed for shortness of breath. For shortness of breath    Dispense:  8 g    Refill:  11   gabapentin (NEURONTIN) 600 MG tablet    Sig: Take 1 tablet (600 mg total) by mouth 2 (two) times daily.    Dispense:  180 tablet    Refill:  1   glucose blood (ONE TOUCH ULTRA TEST) test strip    Sig: USE TO TEST 3 TIMES A DAY    Dispense:  100 each    Refill:  11    Follow-up: No follow-ups on file.    [name], FNP

## 2019-07-17 NOTE — Telephone Encounter (Signed)
Pt left message on nurse line asking about getting diabetic shoes and paperwork that she needed.  I returned call and left message upon looking in chart pt is seen by a nurse practitioner and per medicare guidelines pt has to be seen a md/do. Or if she is seeing an endocrinologist she needs to get an appt to with [place] and we would send paperwork to the doctor. I asked her to give me a call back to discuss further.

## 2019-07-19 ENCOUNTER — Ambulatory Visit: Payer: Medicare Other | Admitting: Family Medicine

## 2019-07-23 ENCOUNTER — Ambulatory Visit: Payer: Self-pay | Admitting: Family Medicine

## 2019-07-31 ENCOUNTER — Ambulatory Visit (INDEPENDENT_AMBULATORY_CARE_PROVIDER_SITE_OTHER): Payer: Medicare Other | Admitting: Family Medicine

## 2019-07-31 ENCOUNTER — Other Ambulatory Visit: Payer: Self-pay

## 2019-07-31 ENCOUNTER — Encounter: Payer: Self-pay | Admitting: Family Medicine

## 2019-07-31 VITALS — BP 122/74 | HR 79 | Temp 98.4°F | Ht 61.0 in | Wt 142.4 lb

## 2019-07-31 DIAGNOSIS — I10 Essential (primary) hypertension: Secondary | ICD-10-CM

## 2019-07-31 DIAGNOSIS — Z09 Encounter for follow-up examination after completed treatment for conditions other than malignant neoplasm: Secondary | ICD-10-CM

## 2019-07-31 DIAGNOSIS — Z992 Dependence on renal dialysis: Secondary | ICD-10-CM

## 2019-07-31 DIAGNOSIS — J449 Chronic obstructive pulmonary disease, unspecified: Secondary | ICD-10-CM | POA: Diagnosis not present

## 2019-07-31 DIAGNOSIS — E1122 Type 2 diabetes mellitus with diabetic chronic kidney disease: Secondary | ICD-10-CM | POA: Diagnosis not present

## 2019-07-31 DIAGNOSIS — N185 Chronic kidney disease, stage 5: Secondary | ICD-10-CM | POA: Diagnosis not present

## 2019-07-31 DIAGNOSIS — Z124 Encounter for screening for malignant neoplasm of cervix: Secondary | ICD-10-CM | POA: Diagnosis not present

## 2019-07-31 DIAGNOSIS — N186 End stage renal disease: Secondary | ICD-10-CM

## 2019-07-31 LAB — POCT URINALYSIS DIPSTICK
Bilirubin, UA: NEGATIVE
Glucose, UA: NEGATIVE
Leukocytes, UA: NEGATIVE
Nitrite, UA: NEGATIVE
Protein, UA: POSITIVE — AB
Spec Grav, UA: 1.02 (ref 1.010–1.025)
Urobilinogen, UA: 0.2 E.U./dL
pH, UA: 6 (ref 5.0–8.0)

## 2019-07-31 NOTE — Progress Notes (Signed)
[facility]  Pap Smear  Subjective:  Patient ID: Tammy Moses, female    DOB: [birth year]  Age: 53 y.o. MRN: [id number]  CC:  Chief Complaint  Patient presents with  . Follow-up    HPI Tammy Moses is a 53 year old female who presents for Pap Smear today.   Past Medical History:  Diagnosis Date  . Anemia   . Anxiety   . Blind right eye   . Constipation, chronic   . Dental caries   . Diabetes mellitus   . Diabetic neuropathy ([place])   . Diabetic retinopathy   . GERD (gastroesophageal reflux disease)   . Glaucoma   . H. pylori infection   . Hepatitis C carrier ([place])   . High risk sexual behavior   . Hyperlipidemia   . Hypertension   . Hypoglycemia 07/12/2017  . Insomnia   . Microalbuminuria   . Nonspecific elevation of levels of transaminase or lactic acid dehydrogenase (LDH)   . Tobacco dependence   . Vitamin D deficiency     Current Status: Since her last office visit, she is doing well with no complaints. Patient here for routine gynecological exam.  She has previously had one Pap smear. Patient states that she has never had an abnormal Pap smear. She denies abnormal vaginal discharge, vaginal  itching, vaginal burning, or dyspareunia. Patient states that she does perform monthly self breast exams. She does not have any family history of cancer that she knows of.  She typically follows a balanced diet but does not exercise routinely. Body mass index is 26.91. No discharge, discomfort, irregular bleeding, abdominal pain. She denies visual changes, chest pain, cough, shortness of breath, heart palpitations, and falls. She has occasional headaches and dizziness with position changes. Denies severe headaches, confusion, seizures, double vision, and blurred vision, nausea and vomiting. She denies fevers, chills, fatigue, recent infections, weight loss, and night sweats.  No reports of GI problems such as diarrhea, and  constipation. She has no reports of blood in stools, dysuria and hematuria. No depression or anxiety reported today. She denies pain today.   Past Surgical History:  Procedure Laterality Date  . ABDOMINAL HYSTERECTOMY  04/30/2009   PARTIAL  . COLONOSCOPY    . DIALYSIS FISTULA CREATION Left 06/2017  . ESOPHAGOGASTRODUODENOSCOPY      Family History  Problem Relation Age of Onset  . High blood pressure Mother   . Diabetes Mother   . Thyroid disease Mother   . Diabetes Father   . High blood pressure Father     Social History   Socioeconomic History  . Marital status: Divorced    Spouse name: Not on file  . Number of children: 2  . Years of education: 30  . Highest education level: Not on file  Occupational History    Employer: DISABLED    Comment: Disabled  Social Needs  . Financial resource strain: Not on file  . Food insecurity    Worry: Not on file    Inability: Not on file  . Transportation needs    Medical: Not on file    Non-medical: Not on file  Tobacco Use  . Smoking status: Current Every Day Smoker    Packs/day: 0.25    Types: Cigarettes  . Smokeless tobacco: Never Used  . Tobacco comment: trying to quit  Substance and Sexual Activity  . Alcohol use: Yes    Comment: 2-3 beers weekly  .  Drug use: Not Currently    Types: Marijuana, Cocaine    Comment: quit 3 months ago  . Sexual activity: Not Currently  Lifestyle  . Physical activity    Days per week: Not on file    Minutes per session: Not on file  . Stress: Not on file  Relationships  . Social Herbalist on phone: Not on file    Gets together: Not on file    Attends religious service: Not on file    Active member of club or organization: Not on file    Attends meetings of clubs or organizations: Not on file    Relationship status: Not on file  . Intimate partner violence    Fear of current or ex partner: Not on file    Emotionally abused: Not on file    Physically abused: Not on file     Forced sexual activity: Not on file  Other Topics Concern  . Not on file  Social History Narrative   Patient lives at home alone.   Disabled.   Right handed.   Education 11 th grade.   Caffeine three cups of coffee daily.   No psychiatrist    Outpatient Medications Prior to Visit  Medication Sig Dispense Refill  . albuterol (VENTOLIN HFA) 108 (90 Base) MCG/ACT inhaler Inhale 2 puffs into the lungs every 4 (four) hours as needed for shortness of breath. For shortness of breath 8 g 11  . amLODipine (NORVASC) 10 MG tablet Take 10 mg by mouth daily.  3  . atropine 1 % ophthalmic solution Place 1 drop into both eyes 2 (two) times daily.    . blood glucose meter kit and supplies KIT Dispense based on patient and insurance preference. Use up to four times daily as directed. (FOR ICD-10 E.11.9) 1 each 0  . buPROPion (WELLBUTRIN SR) 150 MG 12 hr tablet Take 1 tablet (150 mg total) by mouth 2 (two) times daily. 60 tablet 2  . calcium acetate (PHOSLO) 667 MG capsule Take 2,001 mg by mouth 3 (three) times daily with meals.    . Cholecalciferol (VITAMIN D-3) 1000 units CAPS Take 2 capsules (2,000 Units total) by mouth daily. 90 capsule 3  . furosemide (LASIX) 40 MG tablet Take 40 mg by mouth daily.  3  . gabapentin (NEURONTIN) 600 MG tablet Take 1 tablet (600 mg total) by mouth 2 (two) times daily. 180 tablet 1  . glucose blood (ONE TOUCH ULTRA TEST) test strip USE TO TEST 3 TIMES A DAY 100 each 11  . hydrALAZINE (APRESOLINE) 100 MG tablet Take 1 tablet (100 mg total) by mouth 2 (two) times daily. 60 tablet 0  . Insulin Glargine (LANTUS SOLOSTAR) 100 UNIT/ML Solostar Pen Inject 20 Units into the skin daily at 10 pm. 5 pen PRN  . Insulin Pen Needle 29G X 12MM MISC E11.9 Dx Code Use once at bedtime with each nightly insulin 100 each 3  . ketotifen (ZADITOR) 0.025 % ophthalmic solution Place 1 drop into both eyes 2 (two) times daily. 5 mL 0  . Lancets (ONETOUCH ULTRASOFT) lancets Use as instructed 100  each 12  . omeprazole (PRILOSEC) 20 MG capsule Take 1 capsule (20 mg total) by mouth daily. Has to be taken at the same time with Harvoni FASTED (Patient not taking: Reported on 07/17/2019) 30 capsule 11  . pravastatin (PRAVACHOL) 80 MG tablet Take 1 tablet (80 mg total) by mouth daily. 15 tablet 0  . timolol (TIMOPTIC)  0.5 % ophthalmic solution Place 1 drop into the left eye at bedtime.   11  . Travoprost, BAK Free, (TRAVATAN) 0.004 % SOLN ophthalmic solution Place 1 drop into both eyes at bedtime.    . traZODone (DESYREL) 50 MG tablet Take 2 tablets every night at bedtime for sleep 60 tablet 3   No facility-administered medications prior to visit.     Allergies  Allergen Reactions  . Acyclovir And Related Itching    ROS Review of Systems  Constitutional: Negative.   HENT: Negative.   Eyes: Positive for visual disturbance.  Respiratory: Negative.   Cardiovascular: Negative.   Gastrointestinal: Negative.   Endocrine: Negative.   Genitourinary: Negative.   Musculoskeletal: Positive for arthralgias (generalized joint pain).  Skin: Negative.   Allergic/Immunologic: Negative.   Neurological: Negative.   Hematological: Negative.   Psychiatric/Behavioral: Negative.       Objective:    Physical Exam  Constitutional: She is oriented to person, place, and time. She appears well-developed and well-nourished.  HENT:  Head: Normocephalic and atraumatic.  Eyes: Conjunctivae are normal.  Neck: Neck supple.  Cardiovascular: Normal rate, regular rhythm, normal heart sounds and intact distal pulses.  Pulmonary/Chest: Effort normal and breath sounds normal.  Abdominal: Soft. Bowel sounds are normal.  Musculoskeletal: Normal range of motion.  Neurological: She is alert and oriented to person, place, and time. She has normal reflexes.  Skin: Skin is warm and dry.  Psychiatric: She has a normal mood and affect. Her behavior is normal. Judgment and thought content normal.    BP 122/74 (BP  Location: Right Arm, Patient Position: Sitting, Cuff Size: Normal)   Pulse 79   Temp 98.4 F (36.9 C) (Oral)   Ht 5' 1"  (1.549 m)   Wt 142 lb 6.4 oz (64.6 kg)   SpO2 97%   BMI 26.91 kg/m  Wt Readings from Last 3 Encounters:  07/31/19 142 lb 6.4 oz (64.6 kg)  07/17/19 143 lb 3.2 oz (65 kg)  01/16/19 146 lb (66.2 kg)     Health Maintenance Due  Topic Date Due  . PNEUMOCOCCAL POLYSACCHARIDE VACCINE AGE 49-64 HIGH RISK  07/20/1966  . PAP SMEAR-Modifier  07/20/1985  . TETANUS/TDAP  10/25/2014  . OPHTHALMOLOGY EXAM  05/24/2019    There are no preventive care reminders to display for this patient.  Lab Results  Component Value Date   TSH 2.690 12/13/2017   Lab Results  Component Value Date   WBC 7.2 05/02/2019   HGB 14.9 05/02/2019   HCT 48.1 (H) 05/02/2019   MCV 101.3 (H) 05/02/2019   PLT 293 05/02/2019   Lab Results  Component Value Date   NA 137 05/02/2019   K 4.5 05/02/2019   CO2 14 (L) 05/02/2019   GLUCOSE 100 (H) 05/02/2019   BUN 55 (H) 05/02/2019   CREATININE 10.54 (H) 05/02/2019   BILITOT 0.7 05/02/2019   ALKPHOS 68 05/02/2019   AST 25 05/02/2019   ALT 16 05/02/2019   PROT 9.8 (H) 05/02/2019   ALBUMIN 4.5 05/02/2019   CALCIUM 9.1 05/02/2019   ANIONGAP 15 05/02/2019   Lab Results  Component Value Date   CHOL 339 (H) 09/07/2016   Lab Results  Component Value Date   HDL 87 09/07/2016   Lab Results  Component Value Date   LDLCALC 213 (H) 09/07/2016   Lab Results  Component Value Date   TRIG 193 (H) 09/07/2016   Lab Results  Component Value Date   CHOLHDL 3.9 09/07/2016   Lab  Results  Component Value Date   HGBA1C 5.2 07/17/2019   HGBA1C 5.2 07/17/2019   HGBA1C 5.2 (A) 07/17/2019   HGBA1C 5.2 07/17/2019   Assessment & Plan:   1. Screening for cervical cancer Normal exam today. She tolerated procedure well. Results are pending.  Follow-up for scheduled mammogram Recommend monthly self breast exam Recommend daily multivitamin for women  Recommend strength training in 150 minutes of cardiovascular exercise per week - Pap IG w/ reflex to HPV when ASC-U ([facility])  2. Type 2 diabetes mellitus with stage 5 chronic kidney disease (HCC) Hgb A1c is stable at 5.2. She will continue medication as prescribed, to decrease foods/beverages high in sugars and carbs and follow Heart Healthy or DASH diet. Increase physical activity to at least 30 minutes cardio exercise daily.  - POCT urinalysis dipstick  3. Chronic obstructive pulmonary disease, unspecified COPD type ([place]) Stable. No signs or symptoms of respiratory distress noted or reported today.   4. Essential hypertension The current medical regimen is effective; blood pressure is stable at 122/74 today; continue present plan and medications as prescribed. She will continue to take medications as prescribed, to decrease high sodium intake, excessive alcohol intake, increase potassium intake, smoking cessation, and increase physical activity of at least 30 minutes of cardio activity daily. She will continue to follow Heart Healthy or DASH diet.  5. ESRD (end stage renal disease) on dialysis [facility]) Continue dialysis treatments on Tues, Thurs, and Sat.   6. Follow up She will follow up in 3 months.   Problem List Items Addressed This Visit      Cardiovascular and Mediastinum   Essential hypertension     Respiratory   COPD (chronic obstructive pulmonary disease) ([place])     Genitourinary   ESRD (end stage renal disease) on dialysis ([place])    Other Visit Diagnoses    Screening for cervical cancer    -  Primary   Relevant Orders   Pap IG w/ reflex to HPV when ASC-U ([facility])   Type 2 diabetes mellitus with stage 5 chronic kidney disease ([place])       Relevant Orders   POCT urinalysis dipstick (Completed)   Follow up          No orders of the defined types were placed in this encounter.   Follow-up: Return in about 3 months (around 10/31/2019).    [name], FNP

## 2019-08-06 LAB — PAP IG W/ RFLX HPV ASCU

## 2019-09-10 ENCOUNTER — Ambulatory Visit (INDEPENDENT_AMBULATORY_CARE_PROVIDER_SITE_OTHER): Payer: Medicare Other | Admitting: Podiatry

## 2019-09-10 ENCOUNTER — Encounter: Payer: Self-pay | Admitting: Podiatry

## 2019-09-10 ENCOUNTER — Other Ambulatory Visit: Payer: Self-pay

## 2019-09-10 DIAGNOSIS — E1142 Type 2 diabetes mellitus with diabetic polyneuropathy: Secondary | ICD-10-CM | POA: Diagnosis not present

## 2019-09-10 DIAGNOSIS — M79674 Pain in right toe(s): Secondary | ICD-10-CM

## 2019-09-10 DIAGNOSIS — B351 Tinea unguium: Secondary | ICD-10-CM | POA: Diagnosis not present

## 2019-09-10 DIAGNOSIS — M79675 Pain in left toe(s): Secondary | ICD-10-CM | POA: Diagnosis not present

## 2019-09-10 DIAGNOSIS — L84 Corns and callosities: Secondary | ICD-10-CM | POA: Diagnosis not present

## 2019-09-10 NOTE — Patient Instructions (Signed)
Diabetes Mellitus and Foot Care Foot care is an important part of your health, especially when you have diabetes. Diabetes may cause you to have problems because of poor blood flow (circulation) to your feet and legs, which can cause your skin to:  Become thinner and drier.  Break more easily.  Heal more slowly.  Peel and crack. You may also have nerve damage (neuropathy) in your legs and feet, causing decreased feeling in them. This means that you may not notice minor injuries to your feet that could lead to more serious problems. Noticing and addressing any potential problems early is the best way to prevent future foot problems. How to care for your feet Foot hygiene  Wash your feet daily with warm water and mild soap. Do not use hot water. Then, pat your feet and the areas between your toes until they are completely dry. Do not soak your feet as this can dry your skin.  Trim your toenails straight across. Do not dig under them or around the cuticle. File the edges of your nails with an emery board or nail file.  Apply a moisturizing lotion or petroleum jelly to the skin on your feet and to dry, brittle toenails. Use lotion that does not contain alcohol and is unscented. Do not apply lotion between your toes. Shoes and socks  Wear clean socks or stockings every day. Make sure they are not too tight. Do not wear knee-high stockings since they may decrease blood flow to your legs.  Wear shoes that fit properly and have enough cushioning. Always look in your shoes before you put them on to be sure there are no objects inside.  To break in new shoes, wear them for just a few hours a day. This prevents injuries on your feet. Wounds, scrapes, corns, and calluses  Check your feet daily for blisters, cuts, bruises, sores, and redness. If you cannot see the bottom of your feet, use a mirror or ask someone for help.  Do not cut corns or calluses or try to remove them with medicine.  If you  find a minor scrape, cut, or break in the skin on your feet, keep it and the skin around it clean and dry. You may clean these areas with mild soap and water. Do not clean the area with peroxide, alcohol, or iodine.  If you have a wound, scrape, corn, or callus on your foot, look at it several times a day to make sure it is healing and not infected. Check for: ? Redness, swelling, or pain. ? Fluid or blood. ? Warmth. ? Pus or a bad smell. General instructions  Do not cross your legs. This may decrease blood flow to your feet.  Do not use heating pads or hot water bottles on your feet. They may burn your skin. If you have lost feeling in your feet or legs, you may not know this is happening until it is too late.  Protect your feet from hot and cold by wearing shoes, such as at the beach or on hot pavement.  Schedule a complete foot exam at least once a year (annually) or more often if you have foot problems. If you have foot problems, report any cuts, sores, or bruises to your health care provider immediately. Contact a health care provider if:  You have a medical condition that increases your risk of infection and you have any cuts, sores, or bruises on your feet.  You have an injury that is not   healing.  You have redness on your legs or feet.  You feel burning or tingling in your legs or feet.  You have pain or cramps in your legs and feet.  Your legs or feet are numb.  Your feet always feel cold.  You have pain around a toenail. Get help right away if:  You have a wound, scrape, corn, or callus on your foot and: ? You have pain, swelling, or redness that gets worse. ? You have fluid or blood coming from the wound, scrape, corn, or callus. ? Your wound, scrape, corn, or callus feels warm to the touch. ? You have pus or a bad smell coming from the wound, scrape, corn, or callus. ? You have a fever. ? You have a red line going up your leg. Summary  Check your feet every day  for cuts, sores, red spots, swelling, and blisters.  Moisturize feet and legs daily.  Wear shoes that fit properly and have enough cushioning.  If you have foot problems, report any cuts, sores, or bruises to your health care provider immediately.  Schedule a complete foot exam at least once a year (annually) or more often if you have foot problems. This information is not intended to replace advice given to you by your health care provider. Make sure you discuss any questions you have with your health care provider. Document Released: 10/08/2000 Document Revised: 11/23/2017 Document Reviewed: 11/12/2016 Elsevier Patient Education  2020 [employer]  

## 2019-09-11 ENCOUNTER — Ambulatory Visit
Admission: RE | Admit: 2019-09-11 | Discharge: 2019-09-11 | Disposition: A | Discharge status: Home / Self Care | Payer: Medicare Other | Source: Ambulatory Visit | Attending: Family Medicine | Admitting: Family Medicine

## 2019-09-11 DIAGNOSIS — Z1239 Encounter for other screening for malignant neoplasm of breast: Secondary | ICD-10-CM

## 2019-09-16 NOTE — Progress Notes (Signed)
Subjective: Tammy Moses is seen today for follow up painful, elongated, thickened toenails 1-5 b/l feet that she cannot cut. Pain interferes with daily activities. Aggravating factor includes wearing enclosed shoe gear and relieved with periodic debridement.  Current Outpatient Medications on File Prior to Visit  Medication Sig  . albuterol (VENTOLIN HFA) 108 (90 Base) MCG/ACT inhaler Inhale 2 puffs into the lungs every 4 (four) hours as needed for shortness of breath. For shortness of breath  . amLODipine (NORVASC) 10 MG tablet Take 10 mg by mouth daily.  Marland Kitchen atropine 1 % ophthalmic solution Place 1 drop into both eyes 2 (two) times daily.  . blood glucose meter kit and supplies KIT Dispense based on patient and insurance preference. Use up to four times daily as directed. (FOR ICD-10 E.11.9)  . buPROPion (WELLBUTRIN SR) 150 MG 12 hr tablet Take 1 tablet (150 mg total) by mouth 2 (two) times daily.  . calcium acetate (PHOSLO) 667 MG capsule Take 2,001 mg by mouth 3 (three) times daily with meals.  . Cholecalciferol (VITAMIN D-3) 1000 units CAPS Take 2 capsules (2,000 Units total) by mouth daily.  . furosemide (LASIX) 40 MG tablet Take 40 mg by mouth daily.  Marland Kitchen gabapentin (NEURONTIN) 600 MG tablet Take 1 tablet (600 mg total) by mouth 2 (two) times daily.  Marland Kitchen glucose blood (ONE TOUCH ULTRA TEST) test strip USE TO TEST 3 TIMES A DAY  . hydrALAZINE (APRESOLINE) 100 MG tablet Take 1 tablet (100 mg total) by mouth 2 (two) times daily.  . Insulin Glargine (LANTUS SOLOSTAR) 100 UNIT/ML Solostar Pen Inject 20 Units into the skin daily at 10 pm.  . Insulin Pen Needle 29G X 12MM MISC E11.9 Dx Code Use once at bedtime with each nightly insulin  . ketotifen (ZADITOR) 0.025 % ophthalmic solution Place 1 drop into both eyes 2 (two) times daily.  . Lancets (ONETOUCH ULTRASOFT) lancets Use as instructed  . omeprazole (PRILOSEC) 20 MG capsule Take 1 capsule (20 mg total) by mouth daily. Has to be taken at the  same time with Harvoni FASTED (Patient not taking: Reported on 07/17/2019)  . pravastatin (PRAVACHOL) 80 MG tablet Take 1 tablet (80 mg total) by mouth daily.  . timolol (TIMOPTIC) 0.5 % ophthalmic solution Place 1 drop into the left eye at bedtime.   . Travoprost, BAK Free, (TRAVATAN) 0.004 % SOLN ophthalmic solution Place 1 drop into both eyes at bedtime.  . traZODone (DESYREL) 50 MG tablet Take 2 tablets every night at bedtime for sleep   No current facility-administered medications on file prior to visit.      Allergies  Allergen Reactions  . Acyclovir And Related Itching    Objective:  Vascular Examination: Capillary refill time <3 seconds b/l.  Dorsalis pedis and posterior tibial pulses faintly palpable b/l.  Digital hair decreased b/l.   Skin temperature gradient WNL b/l.   Dermatological Examination: Skin with normal turgor, texture and tone b/l.  Toenails 1-5 b/l discolored, thick, dystrophic with subungual debris and pain with palpation to nailbeds due to thickness of nails.  Hyperkeratotic lesion submet head plantar arch x 1 left foot with tenderness to palpation. No edema, no erythema, no drainage, no flocculence.  Musculoskeletal: Muscle strength 5/5 to all LE muscle groups b/l.   No pain, crepitus or joint limitation noted with ROM b/l.    Neurological Examination: Protective sensation diminished left foot and 4/5 right foot with 10 gram monofilament.  Assessment: Painful onychomycosis toenails 1-5 b/l  Callus  plantar arch left foot x 1  NIDDM with neuropathy  Plan: 1. Toenails 1-5 b/l were debrided in length and girth without iatrogenic bleeding.  2. Callus pared plantar arch left foot utilizing sterile scalpel blade without incident. 3. Patient to continue soft, supportive shoe gear. 4. Patient to report any pedal injuries to medical professional immediately. 5. Follow up 3 months.  6. Patient/POA to call should there be a concern in the interim.

## 2019-10-31 ENCOUNTER — Ambulatory Visit: Payer: Self-pay | Admitting: Family Medicine

## 2019-11-01 ENCOUNTER — Encounter: Payer: Medicare Other | Admitting: Vascular Surgery

## 2019-11-27 ENCOUNTER — Ambulatory Visit: Payer: Self-pay | Admitting: Family Medicine

## 2019-11-28 ENCOUNTER — Encounter: Payer: Self-pay | Admitting: Surgery

## 2019-12-04 ENCOUNTER — Encounter: Payer: Self-pay | Admitting: Surgery

## 2019-12-10 ENCOUNTER — Ambulatory Visit: Payer: Medicare Other | Admitting: Podiatry

## 2019-12-11 ENCOUNTER — Other Ambulatory Visit: Payer: Self-pay

## 2019-12-11 ENCOUNTER — Encounter: Payer: Self-pay | Admitting: Family Medicine

## 2019-12-11 ENCOUNTER — Ambulatory Visit (INDEPENDENT_AMBULATORY_CARE_PROVIDER_SITE_OTHER): Payer: Medicare Other | Admitting: Family Medicine

## 2019-12-11 VITALS — BP 157/89 | HR 81 | Temp 97.6°F | Ht 61.0 in | Wt 138.2 lb

## 2019-12-11 DIAGNOSIS — E1122 Type 2 diabetes mellitus with diabetic chronic kidney disease: Secondary | ICD-10-CM | POA: Diagnosis not present

## 2019-12-11 DIAGNOSIS — N186 End stage renal disease: Secondary | ICD-10-CM | POA: Diagnosis not present

## 2019-12-11 DIAGNOSIS — H44511 Absolute glaucoma, right eye: Secondary | ICD-10-CM

## 2019-12-11 DIAGNOSIS — G63 Polyneuropathy in diseases classified elsewhere: Secondary | ICD-10-CM

## 2019-12-11 DIAGNOSIS — G894 Chronic pain syndrome: Secondary | ICD-10-CM

## 2019-12-11 DIAGNOSIS — I10 Essential (primary) hypertension: Secondary | ICD-10-CM | POA: Diagnosis not present

## 2019-12-11 DIAGNOSIS — Z09 Encounter for follow-up examination after completed treatment for conditions other than malignant neoplasm: Secondary | ICD-10-CM

## 2019-12-11 DIAGNOSIS — K219 Gastro-esophageal reflux disease without esophagitis: Secondary | ICD-10-CM

## 2019-12-11 DIAGNOSIS — J449 Chronic obstructive pulmonary disease, unspecified: Secondary | ICD-10-CM

## 2019-12-11 DIAGNOSIS — N185 Chronic kidney disease, stage 5: Secondary | ICD-10-CM

## 2019-12-11 DIAGNOSIS — Z992 Dependence on renal dialysis: Secondary | ICD-10-CM

## 2019-12-11 DIAGNOSIS — F419 Anxiety disorder, unspecified: Secondary | ICD-10-CM

## 2019-12-11 DIAGNOSIS — G4700 Insomnia, unspecified: Secondary | ICD-10-CM

## 2019-12-11 DIAGNOSIS — J45909 Unspecified asthma, uncomplicated: Secondary | ICD-10-CM

## 2019-12-11 LAB — POCT URINALYSIS DIPSTICK
Bilirubin, UA: NEGATIVE
Glucose, UA: POSITIVE — AB
Ketones, UA: NEGATIVE
Leukocytes, UA: NEGATIVE
Nitrite, UA: NEGATIVE
Protein, UA: POSITIVE — AB
Spec Grav, UA: 1.015 (ref 1.010–1.025)
Urobilinogen, UA: 0.2 E.U./dL
pH, UA: 6 (ref 5.0–8.0)

## 2019-12-11 LAB — GLUCOSE, POCT (MANUAL RESULT ENTRY): POC Glucose: 93 mg/dl (ref 70–99)

## 2019-12-11 LAB — POCT GLYCOSYLATED HEMOGLOBIN (HGB A1C): Hemoglobin A1C: 5.3 % (ref 4.0–5.6)

## 2019-12-11 MED ORDER — FUROSEMIDE 40 MG PO TABS: 40.0000 mg | ORAL_TABLET | Freq: Every day | ORAL | 3 refills | Status: DC

## 2019-12-11 MED ORDER — TRAZODONE HCL 150 MG PO TABS: 150.0000 mg | ORAL_TABLET | Freq: Every day | ORAL | 6 refills | Status: DC

## 2019-12-11 MED ORDER — PRAVASTATIN SODIUM 80 MG PO TABS: 80.0000 mg | ORAL_TABLET | Freq: Every day | ORAL | 3 refills | Status: DC

## 2019-12-11 MED ORDER — AMLODIPINE BESYLATE 10 MG PO TABS: 10.0000 mg | ORAL_TABLET | Freq: Every day | ORAL | 3 refills | Status: DC

## 2019-12-11 MED ORDER — BUPROPION HCL ER (SR) 150 MG PO TB12: 150.0000 mg | ORAL_TABLET | Freq: Two times a day (BID) | ORAL | 3 refills | Status: DC

## 2019-12-11 MED ORDER — HYDRALAZINE HCL 50 MG PO TABS: 50.0000 mg | ORAL_TABLET | Freq: Two times a day (BID) | ORAL | 3 refills | Status: DC

## 2019-12-11 MED ORDER — OMEPRAZOLE 20 MG PO CPDR: 20.0000 mg | DELAYED_RELEASE_CAPSULE | Freq: Every day | ORAL | 3 refills | Status: DC

## 2019-12-11 MED ORDER — LANTUS SOLOSTAR 100 UNIT/ML ~~LOC~~ SOPN: 20.0000 [IU] | PEN_INJECTOR | Freq: Every day | SUBCUTANEOUS | 99 refills | Status: DC

## 2019-12-11 MED ORDER — GABAPENTIN 600 MG PO TABS: 600.0000 mg | ORAL_TABLET | Freq: Two times a day (BID) | ORAL | 3 refills | Status: DC

## 2019-12-11 MED ORDER — HYDRALAZINE HCL 50 MG PO TABS: 50.0000 mg | ORAL_TABLET | Freq: Three times a day (TID) | ORAL | 3 refills | Status: DC

## 2019-12-11 MED ORDER — ALBUTEROL SULFATE HFA 108 (90 BASE) MCG/ACT IN AERS: 2.0000 | INHALATION_SPRAY | RESPIRATORY_TRACT | 11 refills | Status: AC | PRN

## 2019-12-11 NOTE — Progress Notes (Signed)
[facility]    Established Patient Office Visit  Subjective:  Patient ID: Tammy Moses, female    DOB: 02/03/1966  Age: 54 y.o. MRN: [id number]  CC:  Chief Complaint  Patient presents with  . Follow-up    3 MTH DM, CKD    HPI Tammy Moses is a 54 year old female who presents for Follow Up today.   Past Medical History:  Diagnosis Date  . Anemia   . Anxiety   . Blind right eye   . Constipation, chronic   . Dental caries   . Diabetes mellitus   . Diabetic neuropathy (HCC)   . Diabetic retinopathy   . GERD (gastroesophageal reflux disease)   . Glaucoma   . H. pylori infection   . Hepatitis C carrier (HCC)   . High risk sexual behavior   . Hyperlipidemia   . Hypertension   . Hypoglycemia 07/12/2017  . Insomnia   . Microalbuminuria   . Nonspecific elevation of levels of transaminase or lactic acid dehydrogenase (LDH)   . Tobacco dependence   . Vitamin D deficiency    Current Status: Since her last office visit, she continues to follow up with Dialysis with treatments on M,W, F at [facility]. Her most recent normal range of preprandial blood glucose levels have been between 97. She has seen low range of 52, in which she did eat graham crackers and high of 181 since his last office visit. She denies fatigue, frequent urination, blurred vision, excessive hunger, excessive thirst, weight gain, weight loss, and poor wound healing. She continues to check her feet regularly. She denies visual changes, chest pain, cough, shortness of breath, heart palpitations, and falls. She has occasional headaches and dizziness with position changes. Denies severe headaches, confusion, seizures, double vision, and blurred vision, nausea and vomiting.She states that her vision is getting worse. She was recently seeing Dr. Barner at [facility] was her latest Optometrist, and she would like to see someone her in [place].    Her anxiety is mild today. She denies suicidal ideations, homicidal ideations, or auditory hallucinations. She denies fevers, chills, recent infections, weight loss, and night sweats. No reports of GI problems such as diarrhea, and constipation. She has no reports of blood in stools, dysuria and hematuria. She continues to have generalized chronic pain.    Past Surgical History:  Procedure Laterality Date  . ABDOMINAL HYSTERECTOMY  04/30/2009   PARTIAL  . COLONOSCOPY    . DIALYSIS FISTULA CREATION Left 06/2017  . ESOPHAGOGASTRODUODENOSCOPY      Family History  Problem Relation Age of Onset  . High blood pressure Mother   . Diabetes Mother   . Thyroid disease Mother   . Diabetes Father   . High blood pressure Father     Social History   Socioeconomic History  . Marital status: Divorced    Spouse name: Not on file  . Number of children: 2  . Years of education: 11  . Highest education level: Not on file  Occupational History    Employer: DISABLED    Comment: Disabled  Tobacco Use  . Smoking status: Current Every Day Smoker    Packs/day: 0.25    Types: Cigarettes  . Smokeless tobacco: Never Used  . Tobacco comment: trying to quit  Substance and Sexual Activity  . Alcohol use: Yes    Comment: 2-3 beers weekly  . Drug use: Not Currently      Types: Marijuana, Cocaine    Comment: quit 3 months ago  . Sexual activity: Yes  Other Topics Concern  . Not on file  Social History Narrative   Patient lives at home alone.   Disabled.   Right handed.   Education 11 th grade.   Caffeine three cups of coffee daily.   No psychiatrist   Social Determinants of Health   Financial Resource Strain:   . Difficulty of Paying Living Expenses: Not on file  Food Insecurity:   . Worried About Running Out of Food in the Last Year: Not on file  . Ran Out of Food in the Last Year: Not on file  Transportation Needs:   . Lack of Transportation (Medical): Not on file  . Lack of  Transportation (Non-Medical): Not on file  Physical Activity:   . Days of Exercise per Week: Not on file  . Minutes of Exercise per Session: Not on file  Stress:   . Feeling of Stress : Not on file  Social Connections:   . Frequency of Communication with Friends and Family: Not on file  . Frequency of Social Gatherings with Friends and Family: Not on file  . Attends Religious Services: Not on file  . Active Member of Clubs or Organizations: Not on file  . Attends Club or Organization Meetings: Not on file  . Marital Status: Not on file  Intimate Partner Violence:   . Fear of Current or Ex-Partner: Not on file  . Emotionally Abused: Not on file  . Physically Abused: Not on file  . Sexually Abused: Not on file    Outpatient Medications Prior to Visit  Medication Sig Dispense Refill  . atropine 1 % ophthalmic solution Place 1 drop into both eyes 2 (two) times daily.    . blood glucose meter kit and supplies KIT Dispense based on patient and insurance preference. Use up to four times daily as directed. (FOR ICD-10 E.11.9) 1 each 0  . calcium acetate (PHOSLO) 667 MG capsule Take 2,001 mg by mouth 3 (three) times daily with meals.    . Cholecalciferol (VITAMIN D-3) 1000 units CAPS Take 2 capsules (2,000 Units total) by mouth daily. 90 capsule 3  . glucose blood (ONE TOUCH ULTRA TEST) test strip USE TO TEST 3 TIMES A DAY 100 each 11  . Insulin Pen Needle 29G X 12MM MISC E11.9 Dx Code Use once at bedtime with each nightly insulin 100 each 3  . ketotifen (ZADITOR) 0.025 % ophthalmic solution Place 1 drop into both eyes 2 (two) times daily. 5 mL 0  . Lancets (ONETOUCH ULTRASOFT) lancets Use as instructed 100 each 12  . timolol (TIMOPTIC) 0.5 % ophthalmic solution Place 1 drop into the left eye at bedtime.   11  . Travoprost, BAK Free, (TRAVATAN) 0.004 % SOLN ophthalmic solution Place 1 drop into both eyes at bedtime.    . albuterol (VENTOLIN HFA) 108 (90 Base) MCG/ACT inhaler Inhale 2 puffs  into the lungs every 4 (four) hours as needed for shortness of breath. For shortness of breath 8 g 11  . amLODipine (NORVASC) 10 MG tablet Take 10 mg by mouth daily.  3  . buPROPion (WELLBUTRIN SR) 150 MG 12 hr tablet Take 1 tablet (150 mg total) by mouth 2 (two) times daily. 60 tablet 2  . furosemide (LASIX) 40 MG tablet Take 40 mg by mouth daily.  3  . gabapentin (NEURONTIN) 600 MG tablet Take 1 tablet (600 mg total) by mouth 2 (  two) times daily. 180 tablet 1  . hydrALAZINE (APRESOLINE) 100 MG tablet Take 1 tablet (100 mg total) by mouth 2 (two) times daily. 60 tablet 0  . Insulin Glargine (LANTUS SOLOSTAR) 100 UNIT/ML Solostar Pen Inject 20 Units into the skin daily at 10 pm. 5 pen PRN  . omeprazole (PRILOSEC) 20 MG capsule Take 1 capsule (20 mg total) by mouth daily. Has to be taken at the same time with Harvoni FASTED 30 capsule 11  . pravastatin (PRAVACHOL) 80 MG tablet Take 1 tablet (80 mg total) by mouth daily. 15 tablet 0  . traZODone (DESYREL) 50 MG tablet Take 2 tablets every night at bedtime for sleep 60 tablet 3   No facility-administered medications prior to visit.    Allergies  Allergen Reactions  . Acyclovir And Related Itching    ROS Review of Systems  Constitutional: Positive for fatigue.  HENT: Negative.   Eyes: Positive for visual disturbance ([place] right eye).  Respiratory: Negative.   Cardiovascular: Negative.   Gastrointestinal: Negative.   Endocrine: Negative.   Genitourinary:       ESRD; dialysis treatments M,W,F  Musculoskeletal: Positive for arthralgias (generalized ).  Skin: Negative.   Allergic/Immunologic: Negative.   Neurological: Positive for dizziness (occasional ) and headaches (occasional ).  Hematological: Negative.   Psychiatric/Behavioral: Negative.       Objective:    Physical Exam  Constitutional: She is oriented to person, place, and time. She appears well-developed and well-nourished.  Ambulated with assistance of cane today.     HENT:  Head: Normocephalic and atraumatic.  Eyes: Conjunctivae are normal.  Cardiovascular: Normal rate, regular rhythm, normal heart sounds and intact distal pulses.  Pulmonary/Chest: Effort normal and breath sounds normal.  Abdominal: Bowel sounds are normal.  Musculoskeletal:        General: Normal range of motion.     Cervical back: Normal range of motion and neck supple.  Neurological: She is alert and oriented to person, place, and time. She has normal reflexes.  Skin: Skin is warm and dry.  Psychiatric: She has a normal mood and affect. Her behavior is normal. Judgment and thought content normal.  Nursing note and vitals reviewed.   BP (!) 157/89   Pulse 81   Temp 97.6 F (36.4 C) (Oral)   Ht 5' 1" (1.549 m)   Wt 138 lb 3.2 oz (62.7 kg)   SpO2 100%   BMI 26.11 kg/m  Wt Readings from Last 3 Encounters:  12/11/19 138 lb 3.2 oz (62.7 kg)  07/31/19 142 lb 6.4 oz (64.6 kg)  07/17/19 143 lb 3.2 oz (65 kg)     Health Maintenance Due  Topic Date Due  . PNEUMOCOCCAL POLYSACCHARIDE VACCINE AGE 2-64 HIGH RISK  07/20/1966  . TETANUS/TDAP  10/25/2014  . OPHTHALMOLOGY EXAM  05/24/2019  . COLONOSCOPY  08/16/2019    There are no preventive care reminders to display for this patient.  Lab Results  Component Value Date   TSH 2.690 12/13/2017   Lab Results  Component Value Date   WBC 7.2 05/02/2019   HGB 14.9 05/02/2019   HCT 48.1 (H) 05/02/2019   MCV 101.3 (H) 05/02/2019   PLT 293 05/02/2019   Lab Results  Component Value Date   NA 137 05/02/2019   K 4.5 05/02/2019   CO2 14 (L) 05/02/2019   GLUCOSE 100 (H) 05/02/2019   BUN 55 (H) 05/02/2019   CREATININE 10.54 (H) 05/02/2019   BILITOT 0.7 05/02/2019   ALKPHOS 68 05/02/2019     AST 25 05/02/2019   ALT 16 05/02/2019   PROT 9.8 (H) 05/02/2019   ALBUMIN 4.5 05/02/2019   CALCIUM 9.1 05/02/2019   ANIONGAP 15 05/02/2019   Lab Results  Component Value Date   CHOL 339 (H) 09/07/2016   Lab Results  Component Value  Date   HDL 87 09/07/2016   Lab Results  Component Value Date   LDLCALC 213 (H) 09/07/2016   Lab Results  Component Value Date   TRIG 193 (H) 09/07/2016   Lab Results  Component Value Date   CHOLHDL 3.9 09/07/2016   Lab Results  Component Value Date   HGBA1C 5.3 12/11/2019      Assessment & Plan:   1. ESRD (end stage renal disease) on dialysis (HCC) Continue dialysis treatments as scheduled on M,W, and Fs.   2. Type 2 diabetes mellitus with stage 5 chronic kidney disease (HCC) She denies fatigue, frequent urination, blurred vision, excessive hunger, excessive thirst, weight gain, weight loss, and poor wound healing. She continues to check her feet regularly.  - POCT glycosylated hemoglobin (Hb A1C) - POCT urinalysis dipstick - POCT glucose (manual entry) - Insulin Glargine (LANTUS SOLOSTAR) 100 UNIT/ML Solostar Pen; Inject 20 Units into the skin daily at 10 pm.  Dispense: 5 pen; Refill: PRN  3. Essential hypertension The current medical regimen is effective; blood pressure is stable today; continue present plan and medications as prescribed. She will continue to take medications as prescribed, to decrease high sodium intake, excessive alcohol intake, increase potassium intake, smoking cessation, and increase physical activity of at least 30 minutes of cardio activity daily. She will continue to follow Heart Healthy or DASH diet. - amLODipine (NORVASC) 10 MG tablet; Take 1 tablet (10 mg total) by mouth daily.  Dispense: 30 tablet; Refill: 3 - furosemide (LASIX) 40 MG tablet; Take 1 tablet (40 mg total) by mouth daily.  Dispense: 30 tablet; Refill: 3 - pravastatin (PRAVACHOL) 80 MG tablet; Take 1 tablet (80 mg total) by mouth daily.  Dispense: 30 tablet; Refill: 3 - hydrALAZINE (APRESOLINE) 50 MG tablet; Take 1 tablet (50 mg total) by mouth in the morning and at bedtime.  Dispense: 60 tablet; Refill: 3  4. Chronic pain syndrome - Ambulatory referral to Pain Clinic  5. Absolute  glaucoma of right eye Worsening. We will refer her to Optometry today.  - Ambulatory referral to Ophthalmology  6. Insomnia, unspecified type - traZODone (DESYREL) 150 MG tablet; Take 1 tablet (150 mg total) by mouth at bedtime.  Dispense: 30 tablet; Refill: 6  7. Moderate asthma without complication, unspecified whether persistent Stable. No signs or symptoms of respiratory distress noted or reported today.  - albuterol (VENTOLIN HFA) 108 (90 Base) MCG/ACT inhaler; Inhale 2 puffs into the lungs every 4 (four) hours as needed for shortness of breath. For shortness of breath  Dispense: 8 g; Refill: 11  8. Chronic obstructive pulmonary disease, unspecified COPD type (HCC) - albuterol (VENTOLIN HFA) 108 (90 Base) MCG/ACT inhaler; Inhale 2 puffs into the lungs every 4 (four) hours as needed for shortness of breath. For shortness of breath  Dispense: 8 g; Refill: 11  9. Polyneuropathy associated with underlying disease (HCC) - gabapentin (NEURONTIN) 600 MG tablet; Take 1 tablet (600 mg total) by mouth 2 (two) times daily.  Dispense: 180 tablet; Refill: 3  10. Gastroesophageal reflux disease without esophagitis - omeprazole (PRILOSEC) 20 MG capsule; Take 1 capsule (20 mg total) by mouth daily. Has to be taken at the same time with   Harvoni FASTED  Dispense: 30 capsule; Refill: 3  11. Anxiety - buPROPion (WELLBUTRIN SR) 150 MG 12 hr tablet; Take 1 tablet (150 mg total) by mouth 2 (two) times daily.  Dispense: 60 tablet; Refill: 3  12. Follow up She will follow up in 3 months.   Meds ordered this encounter  Medications  . traZODone (DESYREL) 150 MG tablet    Sig: Take 1 tablet (150 mg total) by mouth at bedtime.    Dispense:  30 tablet    Refill:  6  . albuterol (VENTOLIN HFA) 108 (90 Base) MCG/ACT inhaler    Sig: Inhale 2 puffs into the lungs every 4 (four) hours as needed for shortness of breath. For shortness of breath    Dispense:  8 g    Refill:  11  . amLODipine (NORVASC) 10 MG  tablet    Sig: Take 1 tablet (10 mg total) by mouth daily.    Dispense:  30 tablet    Refill:  3  . buPROPion (WELLBUTRIN SR) 150 MG 12 hr tablet    Sig: Take 1 tablet (150 mg total) by mouth 2 (two) times daily.    Dispense:  60 tablet    Refill:  3    This prescription was filled on 02/01/2018. Any refills authorized will be placed on file.  . furosemide (LASIX) 40 MG tablet    Sig: Take 1 tablet (40 mg total) by mouth daily.    Dispense:  30 tablet    Refill:  3  . gabapentin (NEURONTIN) 600 MG tablet    Sig: Take 1 tablet (600 mg total) by mouth 2 (two) times daily.    Dispense:  180 tablet    Refill:  3  . Insulin Glargine (LANTUS SOLOSTAR) 100 UNIT/ML Solostar Pen    Sig: Inject 20 Units into the skin daily at 10 pm.    Dispense:  5 pen    Refill:  PRN  . omeprazole (PRILOSEC) 20 MG capsule    Sig: Take 1 capsule (20 mg total) by mouth daily. Has to be taken at the same time with Harvoni FASTED    Dispense:  30 capsule    Refill:  3  . pravastatin (PRAVACHOL) 80 MG tablet    Sig: Take 1 tablet (80 mg total) by mouth daily.    Dispense:  30 tablet    Refill:  3    Patient needs a office visit  . DISCONTD: hydrALAZINE (APRESOLINE) 50 MG tablet    Sig: Take 1 tablet (50 mg total) by mouth 3 (three) times daily.    Dispense:  60 tablet    Refill:  3  . hydrALAZINE (APRESOLINE) 50 MG tablet    Sig: Take 1 tablet (50 mg total) by mouth in the morning and at bedtime.    Dispense:  60 tablet    Refill:  3    Orders Placed This Encounter  Procedures  . Ambulatory referral to Pain Clinic  . Ambulatory referral to Ophthalmology  . POCT glycosylated hemoglobin (Hb A1C)  . POCT urinalysis dipstick  . POCT glucose (manual entry)     Referral Orders     Ambulatory referral to Pain Clinic     Ambulatory referral to Ophthalmology    ,  MSN, FNP-BC [facility] [address]  [place],  [place] [address] [phone]  Problem List Items Addressed This Visit      Cardiovascular   and Mediastinum   Essential hypertension   Relevant Medications   amLODipine (NORVASC) 10 MG tablet   furosemide (LASIX) 40 MG tablet   pravastatin (PRAVACHOL) 80 MG tablet   hydrALAZINE (APRESOLINE) 50 MG tablet     Respiratory   Asthma   Relevant Medications   albuterol (VENTOLIN HFA) 108 (90 Base) MCG/ACT inhaler   COPD (chronic obstructive pulmonary disease) (HCC)   Relevant Medications   albuterol (VENTOLIN HFA) 108 (90 Base) MCG/ACT inhaler     Digestive   GERD   Relevant Medications   omeprazole (PRILOSEC) 20 MG capsule     Nervous and Auditory   Peripheral neuropathy   Relevant Medications   traZODone (DESYREL) 150 MG tablet   buPROPion (WELLBUTRIN SR) 150 MG 12 hr tablet   gabapentin (NEURONTIN) 600 MG tablet     Genitourinary   ESRD (end stage renal disease) on dialysis (HCC)     Other   Absolute glaucoma of right eye   Relevant Orders   Ambulatory referral to Ophthalmology    Other Visit Diagnoses    Type 2 diabetes mellitus with stage 5 chronic kidney disease (HCC)    -  Primary   Relevant Medications   Insulin Glargine (LANTUS SOLOSTAR) 100 UNIT/ML Solostar Pen   pravastatin (PRAVACHOL) 80 MG tablet   Other Relevant Orders   POCT glycosylated hemoglobin (Hb A1C) (Completed)   POCT urinalysis dipstick (Completed)   POCT glucose (manual entry) (Completed)   Chronic pain syndrome       Relevant Medications   traZODone (DESYREL) 150 MG tablet   buPROPion (WELLBUTRIN SR) 150 MG 12 hr tablet   gabapentin (NEURONTIN) 600 MG tablet   Other Relevant Orders   Ambulatory referral to Pain Clinic   Insomnia, unspecified type       Relevant Medications   traZODone (DESYREL) 150 MG tablet   Anxiety       Relevant Medications   traZODone (DESYREL) 150 MG tablet   buPROPion (WELLBUTRIN SR) 150 MG 12 hr tablet   Follow up          Meds ordered this  encounter  Medications  . traZODone (DESYREL) 150 MG tablet    Sig: Take 1 tablet (150 mg total) by mouth at bedtime.    Dispense:  30 tablet    Refill:  6  . albuterol (VENTOLIN HFA) 108 (90 Base) MCG/ACT inhaler    Sig: Inhale 2 puffs into the lungs every 4 (four) hours as needed for shortness of breath. For shortness of breath    Dispense:  8 g    Refill:  11  . amLODipine (NORVASC) 10 MG tablet    Sig: Take 1 tablet (10 mg total) by mouth daily.    Dispense:  30 tablet    Refill:  3  . buPROPion (WELLBUTRIN SR) 150 MG 12 hr tablet    Sig: Take 1 tablet (150 mg total) by mouth 2 (two) times daily.    Dispense:  60 tablet    Refill:  3    This prescription was filled on 02/01/2018. Any refills authorized will be placed on file.  . furosemide (LASIX) 40 MG tablet    Sig: Take 1 tablet (40 mg total) by mouth daily.    Dispense:  30 tablet    Refill:  3  . gabapentin (NEURONTIN) 600 MG tablet    Sig: Take 1 tablet (600 mg total) by mouth 2 (two) times daily.    Dispense:  180 tablet      Refill:  3  . Insulin Glargine (LANTUS SOLOSTAR) 100 UNIT/ML Solostar Pen    Sig: Inject 20 Units into the skin daily at 10 pm.    Dispense:  5 pen    Refill:  PRN  . omeprazole (PRILOSEC) 20 MG capsule    Sig: Take 1 capsule (20 mg total) by mouth daily. Has to be taken at the same time with Harvoni FASTED    Dispense:  30 capsule    Refill:  3  . pravastatin (PRAVACHOL) 80 MG tablet    Sig: Take 1 tablet (80 mg total) by mouth daily.    Dispense:  30 tablet    Refill:  3    Patient needs a office visit  . DISCONTD: hydrALAZINE (APRESOLINE) 50 MG tablet    Sig: Take 1 tablet (50 mg total) by mouth 3 (three) times daily.    Dispense:  60 tablet    Refill:  3  . hydrALAZINE (APRESOLINE) 50 MG tablet    Sig: Take 1 tablet (50 mg total) by mouth in the morning and at bedtime.    Dispense:  60 tablet    Refill:  3    Follow-up: Return in about 3 months (around 03/09/2020).     M  , FNP 

## 2019-12-14 ENCOUNTER — Telehealth: Payer: Self-pay

## 2019-12-14 NOTE — Telephone Encounter (Signed)
A detaild voicemail message has been left on [employer] phone. She has been given an appointment with Dr. Webb Laws on 01/10/2020 at 3 pm, ph# [phone].

## 2019-12-26 DIAGNOSIS — N2581 Secondary hyperparathyroidism of renal origin: Secondary | ICD-10-CM | POA: Diagnosis not present

## 2019-12-26 DIAGNOSIS — N186 End stage renal disease: Secondary | ICD-10-CM | POA: Diagnosis not present

## 2019-12-26 DIAGNOSIS — E11621 Type 2 diabetes mellitus with foot ulcer: Secondary | ICD-10-CM | POA: Diagnosis not present

## 2019-12-26 DIAGNOSIS — D631 Anemia in chronic kidney disease: Secondary | ICD-10-CM | POA: Diagnosis not present

## 2019-12-28 DIAGNOSIS — D631 Anemia in chronic kidney disease: Secondary | ICD-10-CM | POA: Diagnosis not present

## 2019-12-28 DIAGNOSIS — N2581 Secondary hyperparathyroidism of renal origin: Secondary | ICD-10-CM | POA: Diagnosis not present

## 2019-12-28 DIAGNOSIS — N186 End stage renal disease: Secondary | ICD-10-CM | POA: Diagnosis not present

## 2020-01-02 DIAGNOSIS — N2581 Secondary hyperparathyroidism of renal origin: Secondary | ICD-10-CM | POA: Diagnosis not present

## 2020-01-02 DIAGNOSIS — N186 End stage renal disease: Secondary | ICD-10-CM | POA: Diagnosis not present

## 2020-01-02 DIAGNOSIS — D631 Anemia in chronic kidney disease: Secondary | ICD-10-CM | POA: Diagnosis not present

## 2020-01-07 DIAGNOSIS — N186 End stage renal disease: Secondary | ICD-10-CM | POA: Diagnosis not present

## 2020-01-07 DIAGNOSIS — D509 Iron deficiency anemia, unspecified: Secondary | ICD-10-CM | POA: Diagnosis not present

## 2020-01-07 DIAGNOSIS — N2581 Secondary hyperparathyroidism of renal origin: Secondary | ICD-10-CM | POA: Diagnosis not present

## 2020-01-08 ENCOUNTER — Ambulatory Visit: Payer: Medicare Other | Admitting: Internal Medicine

## 2020-01-18 ENCOUNTER — Ambulatory Visit: Payer: Self-pay | Admitting: Family Medicine

## 2020-01-21 DIAGNOSIS — N2581 Secondary hyperparathyroidism of renal origin: Secondary | ICD-10-CM | POA: Diagnosis not present

## 2020-01-21 DIAGNOSIS — D509 Iron deficiency anemia, unspecified: Secondary | ICD-10-CM | POA: Diagnosis not present

## 2020-01-21 DIAGNOSIS — N186 End stage renal disease: Secondary | ICD-10-CM | POA: Diagnosis not present

## 2020-01-23 DIAGNOSIS — D509 Iron deficiency anemia, unspecified: Secondary | ICD-10-CM | POA: Diagnosis not present

## 2020-01-23 DIAGNOSIS — N186 End stage renal disease: Secondary | ICD-10-CM | POA: Diagnosis not present

## 2020-01-23 DIAGNOSIS — N2581 Secondary hyperparathyroidism of renal origin: Secondary | ICD-10-CM | POA: Diagnosis not present

## 2020-01-23 DIAGNOSIS — Z992 Dependence on renal dialysis: Secondary | ICD-10-CM | POA: Diagnosis not present

## 2020-01-29 DIAGNOSIS — N2581 Secondary hyperparathyroidism of renal origin: Secondary | ICD-10-CM | POA: Diagnosis not present

## 2020-01-29 DIAGNOSIS — I10 Essential (primary) hypertension: Secondary | ICD-10-CM | POA: Diagnosis not present

## 2020-01-29 DIAGNOSIS — N186 End stage renal disease: Secondary | ICD-10-CM | POA: Diagnosis not present

## 2020-01-29 DIAGNOSIS — D631 Anemia in chronic kidney disease: Secondary | ICD-10-CM | POA: Diagnosis not present

## 2020-02-04 DIAGNOSIS — N2581 Secondary hyperparathyroidism of renal origin: Secondary | ICD-10-CM | POA: Diagnosis not present

## 2020-02-04 DIAGNOSIS — N186 End stage renal disease: Secondary | ICD-10-CM | POA: Diagnosis not present

## 2020-02-04 DIAGNOSIS — I10 Essential (primary) hypertension: Secondary | ICD-10-CM | POA: Diagnosis not present

## 2020-02-04 DIAGNOSIS — E11621 Type 2 diabetes mellitus with foot ulcer: Secondary | ICD-10-CM | POA: Diagnosis not present

## 2020-02-04 DIAGNOSIS — D631 Anemia in chronic kidney disease: Secondary | ICD-10-CM | POA: Diagnosis not present

## 2020-02-06 DIAGNOSIS — D631 Anemia in chronic kidney disease: Secondary | ICD-10-CM | POA: Diagnosis not present

## 2020-02-06 DIAGNOSIS — N2581 Secondary hyperparathyroidism of renal origin: Secondary | ICD-10-CM | POA: Diagnosis not present

## 2020-02-06 DIAGNOSIS — I10 Essential (primary) hypertension: Secondary | ICD-10-CM | POA: Diagnosis not present

## 2020-02-06 DIAGNOSIS — N186 End stage renal disease: Secondary | ICD-10-CM | POA: Diagnosis not present

## 2020-02-08 DIAGNOSIS — N2581 Secondary hyperparathyroidism of renal origin: Secondary | ICD-10-CM | POA: Diagnosis not present

## 2020-02-08 DIAGNOSIS — D631 Anemia in chronic kidney disease: Secondary | ICD-10-CM | POA: Diagnosis not present

## 2020-02-08 DIAGNOSIS — N186 End stage renal disease: Secondary | ICD-10-CM | POA: Diagnosis not present

## 2020-02-08 DIAGNOSIS — I10 Essential (primary) hypertension: Secondary | ICD-10-CM | POA: Diagnosis not present

## 2020-02-11 DIAGNOSIS — N2581 Secondary hyperparathyroidism of renal origin: Secondary | ICD-10-CM | POA: Diagnosis not present

## 2020-02-11 DIAGNOSIS — I10 Essential (primary) hypertension: Secondary | ICD-10-CM | POA: Diagnosis not present

## 2020-02-11 DIAGNOSIS — N186 End stage renal disease: Secondary | ICD-10-CM | POA: Diagnosis not present

## 2020-02-11 DIAGNOSIS — D631 Anemia in chronic kidney disease: Secondary | ICD-10-CM | POA: Diagnosis not present

## 2020-02-13 DIAGNOSIS — N186 End stage renal disease: Secondary | ICD-10-CM | POA: Diagnosis not present

## 2020-02-13 DIAGNOSIS — D631 Anemia in chronic kidney disease: Secondary | ICD-10-CM | POA: Diagnosis not present

## 2020-02-13 DIAGNOSIS — I10 Essential (primary) hypertension: Secondary | ICD-10-CM | POA: Diagnosis not present

## 2020-02-13 DIAGNOSIS — N2581 Secondary hyperparathyroidism of renal origin: Secondary | ICD-10-CM | POA: Diagnosis not present

## 2020-02-20 ENCOUNTER — Ambulatory Visit: Payer: Medicare Other | Admitting: Podiatry

## 2020-02-20 DIAGNOSIS — N186 End stage renal disease: Secondary | ICD-10-CM | POA: Diagnosis not present

## 2020-02-20 DIAGNOSIS — I10 Essential (primary) hypertension: Secondary | ICD-10-CM | POA: Diagnosis not present

## 2020-02-20 DIAGNOSIS — D631 Anemia in chronic kidney disease: Secondary | ICD-10-CM | POA: Diagnosis not present

## 2020-02-20 DIAGNOSIS — N2581 Secondary hyperparathyroidism of renal origin: Secondary | ICD-10-CM | POA: Diagnosis not present

## 2020-02-22 DIAGNOSIS — Z992 Dependence on renal dialysis: Secondary | ICD-10-CM | POA: Diagnosis not present

## 2020-02-22 DIAGNOSIS — N186 End stage renal disease: Secondary | ICD-10-CM | POA: Diagnosis not present

## 2020-02-22 DIAGNOSIS — N2581 Secondary hyperparathyroidism of renal origin: Secondary | ICD-10-CM | POA: Diagnosis not present

## 2020-02-22 DIAGNOSIS — I10 Essential (primary) hypertension: Secondary | ICD-10-CM | POA: Diagnosis not present

## 2020-02-22 DIAGNOSIS — D631 Anemia in chronic kidney disease: Secondary | ICD-10-CM | POA: Diagnosis not present

## 2020-03-03 DIAGNOSIS — D509 Iron deficiency anemia, unspecified: Secondary | ICD-10-CM | POA: Diagnosis not present

## 2020-03-03 DIAGNOSIS — N2581 Secondary hyperparathyroidism of renal origin: Secondary | ICD-10-CM | POA: Diagnosis not present

## 2020-03-03 DIAGNOSIS — E11621 Type 2 diabetes mellitus with foot ulcer: Secondary | ICD-10-CM | POA: Diagnosis not present

## 2020-03-03 DIAGNOSIS — D631 Anemia in chronic kidney disease: Secondary | ICD-10-CM | POA: Diagnosis not present

## 2020-03-03 DIAGNOSIS — N186 End stage renal disease: Secondary | ICD-10-CM | POA: Diagnosis not present

## 2020-03-05 DIAGNOSIS — D631 Anemia in chronic kidney disease: Secondary | ICD-10-CM | POA: Diagnosis not present

## 2020-03-05 DIAGNOSIS — N186 End stage renal disease: Secondary | ICD-10-CM | POA: Diagnosis not present

## 2020-03-05 DIAGNOSIS — N2581 Secondary hyperparathyroidism of renal origin: Secondary | ICD-10-CM | POA: Diagnosis not present

## 2020-03-05 DIAGNOSIS — D509 Iron deficiency anemia, unspecified: Secondary | ICD-10-CM | POA: Diagnosis not present

## 2020-03-10 DIAGNOSIS — N2581 Secondary hyperparathyroidism of renal origin: Secondary | ICD-10-CM | POA: Diagnosis not present

## 2020-03-10 DIAGNOSIS — D631 Anemia in chronic kidney disease: Secondary | ICD-10-CM | POA: Diagnosis not present

## 2020-03-10 DIAGNOSIS — N186 End stage renal disease: Secondary | ICD-10-CM | POA: Diagnosis not present

## 2020-03-10 DIAGNOSIS — D509 Iron deficiency anemia, unspecified: Secondary | ICD-10-CM | POA: Diagnosis not present

## 2020-03-11 ENCOUNTER — Ambulatory Visit: Payer: Self-pay | Admitting: Family Medicine

## 2020-03-17 ENCOUNTER — Encounter (HOSPITAL_COMMUNITY): Payer: Self-pay | Admitting: *Deleted

## 2020-03-17 ENCOUNTER — Emergency Department (HOSPITAL_COMMUNITY)
Admission: EM | Admit: 2020-03-17 | Discharge: 2020-03-17 | Disposition: A | Discharge status: Home / Self Care | Payer: Medicare Other | Attending: Emergency Medicine | Admitting: Emergency Medicine

## 2020-03-17 DIAGNOSIS — Z79899 Other long term (current) drug therapy: Secondary | ICD-10-CM | POA: Insufficient documentation

## 2020-03-17 DIAGNOSIS — J449 Chronic obstructive pulmonary disease, unspecified: Secondary | ICD-10-CM | POA: Diagnosis not present

## 2020-03-17 DIAGNOSIS — E1122 Type 2 diabetes mellitus with diabetic chronic kidney disease: Secondary | ICD-10-CM | POA: Diagnosis not present

## 2020-03-17 DIAGNOSIS — I120 Hypertensive chronic kidney disease with stage 5 chronic kidney disease or end stage renal disease: Secondary | ICD-10-CM | POA: Diagnosis not present

## 2020-03-17 DIAGNOSIS — M79671 Pain in right foot: Secondary | ICD-10-CM | POA: Insufficient documentation

## 2020-03-17 DIAGNOSIS — N186 End stage renal disease: Secondary | ICD-10-CM | POA: Insufficient documentation

## 2020-03-17 DIAGNOSIS — F17210 Nicotine dependence, cigarettes, uncomplicated: Secondary | ICD-10-CM | POA: Insufficient documentation

## 2020-03-17 DIAGNOSIS — Z992 Dependence on renal dialysis: Secondary | ICD-10-CM | POA: Diagnosis not present

## 2020-03-17 DIAGNOSIS — Z794 Long term (current) use of insulin: Secondary | ICD-10-CM | POA: Insufficient documentation

## 2020-03-17 MED ORDER — PREDNISONE 20 MG PO TABS
60.0000 mg | ORAL_TABLET | Freq: Once | ORAL | Status: AC
  Administered 2020-03-17: 60 mg via ORAL
  Filled 2020-03-17: qty 3

## 2020-03-17 MED ORDER — OXYCODONE HCL 5 MG PO TABS: 2.5000 mg | ORAL_TABLET | ORAL | 0 refills | Status: DC | PRN

## 2020-03-17 MED ORDER — PREDNISONE 20 MG PO TABS
ORAL_TABLET | ORAL | 0 refills | Status: DC
Start: 2020-03-17 — End: 2021-06-19

## 2020-03-17 MED ORDER — OXYCODONE HCL 5 MG PO TABS
5.0000 mg | ORAL_TABLET | Freq: Once | ORAL | Status: AC
  Administered 2020-03-17: 5 mg via ORAL
  Filled 2020-03-17: qty 1

## 2020-03-17 NOTE — ED Provider Notes (Signed)
[facility] EMERGENCY DEPARTMENT Provider Note   CSN: [id number] Arrival date & time: 03/17/20  1040     History Chief Complaint  Patient presents with  . Foot Pain    Tammy Moses is a 54 y.o. female.  53 yo F with a chief complaints of right foot pain.  This is been going on for a couple days.  Really severe across the top of her foot especially at the base of the right great toe.  She denies injury denies fever.  Feels that even as she is on top of it makes the pain much worse.  She gets dialysis Monday Wednesday and Friday.  Denies any missed sessions.  Denies any increased fluid intake.  Is planning on going to dialysis today after her visit here.  The history is provided by the patient.  Foot Pain This is a new problem. The current episode started 2 days ago. The problem occurs constantly. The problem has not changed since onset.Pertinent negatives include no chest pain, no abdominal pain, no headaches and no shortness of breath. The symptoms are aggravated by bending and twisting. Nothing relieves the symptoms. She has tried nothing for the symptoms. The treatment provided no relief.       Past Medical History:  Diagnosis Date  . Anemia   . Anxiety   . Blind right eye   . Constipation, chronic   . Dental caries   . Diabetes mellitus   . Diabetic neuropathy ([place])   . Diabetic retinopathy   . GERD (gastroesophageal reflux disease)   . Glaucoma   . H. pylori infection   . Hepatitis C carrier ([place])   . High risk sexual behavior   . Hyperlipidemia   . Hypertension   . Hypoglycemia 07/12/2017  . Insomnia   . Microalbuminuria   . Nonspecific elevation of levels of transaminase or lactic acid dehydrogenase (LDH)   . Tobacco dependence   . Vitamin D deficiency     Patient Active Problem List   Diagnosis Date Noted  . Impaired functional mobility and activity tolerance 01/04/2019  . Acute renal failure superimposed on stage 5 chronic kidney  disease, not on chronic dialysis ([place]) 10/05/2017  . Pneumonia of both lungs due to infectious organism 10/05/2017  . Fall   . Hypoglycemia 07/12/2017  . CKD (chronic kidney disease) stage 5, GFR less than 15 ml/min ([facility]) 07/12/2017  . Anemia 07/12/2017  . Alcoholic intoxication without complication ([place])   . COPD (chronic obstructive pulmonary disease) ([place]) 06/24/2017  . ESRD (end stage renal disease) on dialysis ([place]) 06/17/2017  . Foot drop 05/26/2017  . Carpal tunnel syndrome of left wrist 05/20/2017  . 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 Poor compliance with medication 05/17/2017  . Peripheral neuropathy 05/10/2017  . Diabetes mellitus ([place]) 05/10/2017  . 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  . Vitamin D deficiency 04/07/2017  . Absolute glaucoma of right eye 12/27/2016  . Chronic angle-closure glaucoma of left eye, severe stage 12/27/2016  . PCO (posterior capsule opacification), left 12/27/2016  . Gait abnormality 03/07/2014  . Abnormality of gait 03/07/2014  . Hepatitis C virus infection without hepatic coma 03/04/2014  . History of hepatitis C 03/04/2014  . Erosive gastritis 02/14/2014  . Pseudophakia of left eye 10/30/2012  . Asthma 10/13/2012  . Reflux 10/13/2012  . Lens replaced by other means 10/12/2012  . Primary open angle glaucoma 10/12/2012  . Encounter for screening for diabetes mellitus 10/09/2012  . 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 BACK PAIN, LUMBAR 08/18/2010  . IDDM 08/11/2010  . HYPERCHOLESTEROLEMIA 08/11/2010  . HYPOKALEMIA 08/11/2010  . DEPRESSION 08/11/2010  . GLAUCOMA 08/11/2010  . BLINDNESS, RIGHT EYE 08/11/2010  . Essential hypertension 08/11/2010  . GERD 08/11/2010  . CONSTIPATION 08/06/2009    Past Surgical History:  Procedure Laterality Date  . ABDOMINAL HYSTERECTOMY  04/30/2009   PARTIAL  . COLONOSCOPY    . DIALYSIS FISTULA CREATION Left 06/2017  .  ESOPHAGOGASTRODUODENOSCOPY       OB History   No obstetric history on file.     Family History  Problem Relation Age of Onset  . High blood pressure Mother   . Diabetes Mother   . Thyroid disease Mother   . Diabetes Father   . High blood pressure Father     Social History   Tobacco Use  . Smoking status: Current Every Day Smoker    Packs/day: 0.25    Types: Cigarettes  . Smokeless tobacco: Never Used  . Tobacco comment: trying to quit  Substance Use Topics  . Alcohol use: Yes    Comment: 2-3 beers weekly  . Drug use: Not Currently    Types: Marijuana, Cocaine    Comment: quit 3 months ago    Home Medications Prior to Admission medications   Medication Sig Start Date End Date Taking? Authorizing Provider  albuterol (VENTOLIN HFA) 108 (90 Base) MCG/ACT inhaler Inhale 2 puffs into the lungs every 4 (four) hours as needed for shortness of breath. For shortness of breath 12/11/19   [name], FNP  amLODipine (NORVASC) 10 MG tablet Take 1 tablet (10 mg total) by mouth daily. 12/11/19   [name], FNP  atropine 1 % ophthalmic solution Place 1 drop into both eyes 2 (two) times daily.    [provider]  blood glucose meter kit and supplies KIT Dispense based on patient and insurance preference. Use up to four times daily as directed. (FOR ICD-10 E.11.9) 11/30/17   Scot Jun, FNP  buPROPion [facility] SR) 150 MG 12 hr tablet Take 1 tablet (150 mg total) by mouth 2 (two) times daily. 12/11/19   [name], FNP  calcium acetate (PHOSLO) 667 MG capsule Take 2,001 mg by mouth 3 (three) times daily with meals. 07/06/18   [provider]  Cholecalciferol (VITAMIN D-3) 1000 units CAPS Take 2 capsules (2,000 Units total) by mouth daily. 12/13/17   Scot Jun, FNP  furosemide (LASIX) 40 MG tablet Take 1 tablet (40 mg total) by mouth daily. 12/11/19   [name], FNP  gabapentin (NEURONTIN) 600 MG tablet Take 1 tablet (600 mg total)  by mouth 2 (two) times daily. 12/11/19   [name], FNP  glucose blood (ONE TOUCH ULTRA TEST) test strip USE TO TEST 3 TIMES A DAY 07/17/19   [name], FNP  hydrALAZINE (APRESOLINE) 50 MG tablet Take 1 tablet (50 mg total) by mouth in the morning and at bedtime. 12/11/19   [name], FNP  Insulin Glargine (LANTUS SOLOSTAR) 100 UNIT/ML Solostar Pen Inject 20 Units into the skin daily at 10 pm. 12/11/19   [name], FNP  Insulin Pen Needle 29G X 12MM MISC E11.9 Dx Code Use once at bedtime with each nightly insulin 03/11/17   Scot Jun, FNP  ketotifen (ZADITOR) 0.025 % ophthalmic solution Place 1 drop into both eyes 2 (two) times daily. 03/30/16  Janne Napoleon, NP  Lancets [facility] ULTRASOFT) lancets Use as instructed 07/10/19   [name], FNP  omeprazole (PRILOSEC) 20 MG capsule Take 1 capsule (20 mg total) by mouth daily. Has to be taken at the same time with [place] FASTED 12/11/19   [name], FNP  oxyCODONE (ROXICODONE) 5 MG immediate release tablet Take 0.5 tablets (2.5 mg total) by mouth every 4 (four) hours as needed for severe pain. 03/17/20   Deno Etienne, DO  pravastatin (PRAVACHOL) 80 MG tablet Take 1 tablet (80 mg total) by mouth daily. 12/11/19   [name], FNP  predniSONE (DELTASONE) 20 MG tablet 2 tabs po daily x 4 days 03/17/20   Deno Etienne, DO  timolol (TIMOPTIC) 0.5 % ophthalmic solution Place 1 drop into the left eye at bedtime.  05/23/17   [provider]  Travoprost, BAK Free, (TRAVATAN) 0.004 % SOLN ophthalmic solution Place 1 drop into both eyes at bedtime.    [provider]  traZODone (DESYREL) 150 MG tablet Take 1 tablet (150 mg total) by mouth at bedtime. 12/11/19   [name], FNP    Allergies    Acyclovir and related  Review of Systems   Review of Systems  Constitutional: Negative for chills and fever.  HENT: Negative for congestion and rhinorrhea.   Eyes: Negative for redness and visual  disturbance.  Respiratory: Negative for shortness of breath and wheezing.   Cardiovascular: Negative for chest pain and palpitations.  Gastrointestinal: Negative for abdominal pain, nausea and vomiting.  Genitourinary: Negative for dysuria and urgency.  Musculoskeletal: Positive for arthralgias. Negative for myalgias.  Skin: Negative for pallor and wound.  Neurological: Negative for dizziness and headaches.    Physical Exam Updated Vital Signs BP (!) 193/92 (BP Location: Right Arm)   Pulse 87   Temp 98.7 F (37.1 C) (Oral)   Resp 16   SpO2 100%   Physical Exam Vitals and nursing note reviewed.  Constitutional:      General: She is not in acute distress.    Appearance: She is well-developed. She is not diaphoretic.  HENT:     Head: Normocephalic and atraumatic.  Eyes:     Pupils: Pupils are equal, round, and reactive to light.  Cardiovascular:     Rate and Rhythm: Normal rate and regular rhythm.     Heart sounds: No murmur. No friction rub. No gallop.   Pulmonary:     Effort: Pulmonary effort is normal.     Breath sounds: No wheezing or rales.  Abdominal:     General: There is no distension.     Palpations: Abdomen is soft.     Tenderness: There is no abdominal tenderness.  Musculoskeletal:        General: Tenderness present.     Cervical back: Normal range of motion and neck supple.     Comments: Tenderness and swelling to the first MTP of the right foot.   Skin:    General: Skin is warm and dry.  Neurological:     Mental Status: She is alert and oriented to person, place, and time.  Psychiatric:        Behavior: Behavior normal.     ED Results / Procedures / Treatments   Labs (all labs ordered are listed, but only abnormal results are displayed) Labs Reviewed - No data to display  EKG None  Radiology No results found.  Procedures Procedures (including critical care time)  Medications Ordered in ED Medications  predniSONE (DELTASONE) tablet 60 mg  (has no administration in time range)  oxyCODONE (Oxy IR/ROXICODONE) immediate release tablet 5 mg (has no administration in time range)    ED Course  I have reviewed the triage vital signs and the nursing notes.  Pertinent labs & imaging results that were available during my care of the patient were reviewed by me and considered in my medical decision making (see chart for details).    MDM Rules/Calculators/A&P                      54 yo F with a chief complaints of right foot pain.  Patient has no history of gout though clinically that seems to be the most likely diagnosis.  As the patient is on dialysis will do a burst dose of steroids short course of narcotics.  Have her follow-up with her family doctor and nephrologist.  1:11 PM:  I have discussed the diagnosis/risks/treatment options with the patient and believe the pt to be eligible for discharge home to follow-up with PCP. We also discussed returning to the ED immediately if new or worsening sx occur. We discussed the sx which are most concerning (e.g., sudden worsening pain, fever, inability to tolerate by mouth) that necessitate immediate return. Medications administered to the patient during their visit and any new prescriptions provided to the patient are listed below.  Medications given during this visit Medications  predniSONE (DELTASONE) tablet 60 mg (has no administration in time range)  oxyCODONE (Oxy IR/ROXICODONE) immediate release tablet 5 mg (has no administration in time range)     The patient appears reasonably screen and/or stabilized for discharge and I doubt any other medical condition or other [facility] requiring further screening, evaluation, or treatment in the ED at this time prior to discharge.   Final Clinical Impression(s) / ED Diagnoses Final diagnoses:  Acute pain of right foot    Rx / DC Orders ED Discharge Orders         Ordered    oxyCODONE (ROXICODONE) 5 MG immediate release tablet  Every 4 hours  PRN,   Status:  Discontinued     03/17/20 1305    predniSONE (DELTASONE) 20 MG tablet     03/17/20 1305    oxyCODONE (ROXICODONE) 5 MG immediate release tablet  Every 4 hours PRN     03/17/20 [place], Semaj Coburn, DO 03/17/20 1311

## 2020-03-17 NOTE — ED Notes (Signed)
Patient verbalizes understanding of discharge instructions. Opportunity for questioning and answers were provided. Armband removed by staff, pt discharged from ED to home in bus, reports relief with surg shoes applied in ED

## 2020-03-17 NOTE — ED Triage Notes (Addendum)
To ED for eval of left foot pain since Saturday. No injury noted. No wound noted. States pain hurts at great toe joint. No hx of gout. Dialysis MWF- states she is underweight. No sob.

## 2020-03-17 NOTE — Discharge Instructions (Addendum)
Take tylenol 1000mg(2 extra strength) four times a day.  ° °Then take the pain medicine if you feel like you need it. Narcotics do not help with the pain, they only make you care about it less.  You can become addicted to this, people may break into your house to steal it.  It will constipate you.  If you drive under the influence of this medicine you can get a DUI.   ° °

## 2020-03-20 DIAGNOSIS — N186 End stage renal disease: Secondary | ICD-10-CM | POA: Diagnosis not present

## 2020-03-20 DIAGNOSIS — N2581 Secondary hyperparathyroidism of renal origin: Secondary | ICD-10-CM | POA: Diagnosis not present

## 2020-03-20 DIAGNOSIS — D631 Anemia in chronic kidney disease: Secondary | ICD-10-CM | POA: Diagnosis not present

## 2020-03-20 DIAGNOSIS — D509 Iron deficiency anemia, unspecified: Secondary | ICD-10-CM | POA: Diagnosis not present

## 2020-03-21 DIAGNOSIS — D631 Anemia in chronic kidney disease: Secondary | ICD-10-CM | POA: Diagnosis not present

## 2020-03-21 DIAGNOSIS — N2581 Secondary hyperparathyroidism of renal origin: Secondary | ICD-10-CM | POA: Diagnosis not present

## 2020-03-21 DIAGNOSIS — N186 End stage renal disease: Secondary | ICD-10-CM | POA: Diagnosis not present

## 2020-03-21 DIAGNOSIS — D509 Iron deficiency anemia, unspecified: Secondary | ICD-10-CM | POA: Diagnosis not present

## 2020-03-24 DIAGNOSIS — N186 End stage renal disease: Secondary | ICD-10-CM | POA: Diagnosis not present

## 2020-03-24 DIAGNOSIS — Z992 Dependence on renal dialysis: Secondary | ICD-10-CM | POA: Diagnosis not present

## 2020-03-26 DIAGNOSIS — N2581 Secondary hyperparathyroidism of renal origin: Secondary | ICD-10-CM | POA: Diagnosis not present

## 2020-03-26 DIAGNOSIS — D509 Iron deficiency anemia, unspecified: Secondary | ICD-10-CM | POA: Diagnosis not present

## 2020-03-26 DIAGNOSIS — E11621 Type 2 diabetes mellitus with foot ulcer: Secondary | ICD-10-CM | POA: Diagnosis not present

## 2020-03-26 DIAGNOSIS — N186 End stage renal disease: Secondary | ICD-10-CM | POA: Diagnosis not present

## 2020-03-26 DIAGNOSIS — D631 Anemia in chronic kidney disease: Secondary | ICD-10-CM | POA: Diagnosis not present

## 2020-03-31 DIAGNOSIS — N2581 Secondary hyperparathyroidism of renal origin: Secondary | ICD-10-CM | POA: Diagnosis not present

## 2020-03-31 DIAGNOSIS — D509 Iron deficiency anemia, unspecified: Secondary | ICD-10-CM | POA: Diagnosis not present

## 2020-03-31 DIAGNOSIS — D631 Anemia in chronic kidney disease: Secondary | ICD-10-CM | POA: Diagnosis not present

## 2020-03-31 DIAGNOSIS — N186 End stage renal disease: Secondary | ICD-10-CM | POA: Diagnosis not present

## 2020-04-04 DIAGNOSIS — N186 End stage renal disease: Secondary | ICD-10-CM | POA: Diagnosis not present

## 2020-04-04 DIAGNOSIS — N2581 Secondary hyperparathyroidism of renal origin: Secondary | ICD-10-CM | POA: Diagnosis not present

## 2020-04-04 DIAGNOSIS — D509 Iron deficiency anemia, unspecified: Secondary | ICD-10-CM | POA: Diagnosis not present

## 2020-04-04 DIAGNOSIS — D631 Anemia in chronic kidney disease: Secondary | ICD-10-CM | POA: Diagnosis not present

## 2020-04-09 DIAGNOSIS — N186 End stage renal disease: Secondary | ICD-10-CM | POA: Diagnosis not present

## 2020-04-09 DIAGNOSIS — D631 Anemia in chronic kidney disease: Secondary | ICD-10-CM | POA: Diagnosis not present

## 2020-04-09 DIAGNOSIS — N2581 Secondary hyperparathyroidism of renal origin: Secondary | ICD-10-CM | POA: Diagnosis not present

## 2020-04-09 DIAGNOSIS — D509 Iron deficiency anemia, unspecified: Secondary | ICD-10-CM | POA: Diagnosis not present

## 2020-04-17 DIAGNOSIS — N2581 Secondary hyperparathyroidism of renal origin: Secondary | ICD-10-CM | POA: Diagnosis not present

## 2020-04-17 DIAGNOSIS — D509 Iron deficiency anemia, unspecified: Secondary | ICD-10-CM | POA: Diagnosis not present

## 2020-04-17 DIAGNOSIS — N186 End stage renal disease: Secondary | ICD-10-CM | POA: Diagnosis not present

## 2020-04-17 DIAGNOSIS — D631 Anemia in chronic kidney disease: Secondary | ICD-10-CM | POA: Diagnosis not present

## 2020-04-21 DIAGNOSIS — H5202 Hypermetropia, left eye: Secondary | ICD-10-CM | POA: Diagnosis not present

## 2020-04-21 DIAGNOSIS — H401130 Primary open-angle glaucoma, bilateral, stage unspecified: Secondary | ICD-10-CM | POA: Diagnosis not present

## 2020-04-23 DIAGNOSIS — Z992 Dependence on renal dialysis: Secondary | ICD-10-CM | POA: Diagnosis not present

## 2020-04-23 DIAGNOSIS — N186 End stage renal disease: Secondary | ICD-10-CM | POA: Diagnosis not present

## 2020-04-24 DIAGNOSIS — H5213 Myopia, bilateral: Secondary | ICD-10-CM | POA: Diagnosis not present

## 2020-05-05 ENCOUNTER — Emergency Department (HOSPITAL_COMMUNITY)
Admission: EM | Admit: 2020-05-05 | Discharge: 2020-05-05 | Disposition: A | Discharge status: Home / Self Care | Payer: Medicare Other | Attending: Emergency Medicine | Admitting: Emergency Medicine

## 2020-05-05 ENCOUNTER — Other Ambulatory Visit: Payer: Self-pay

## 2020-05-05 ENCOUNTER — Encounter (HOSPITAL_COMMUNITY): Payer: Self-pay

## 2020-05-05 DIAGNOSIS — Z79899 Other long term (current) drug therapy: Secondary | ICD-10-CM | POA: Insufficient documentation

## 2020-05-05 DIAGNOSIS — Z992 Dependence on renal dialysis: Secondary | ICD-10-CM | POA: Diagnosis not present

## 2020-05-05 DIAGNOSIS — E1140 Type 2 diabetes mellitus with diabetic neuropathy, unspecified: Secondary | ICD-10-CM | POA: Diagnosis not present

## 2020-05-05 DIAGNOSIS — N186 End stage renal disease: Secondary | ICD-10-CM | POA: Diagnosis not present

## 2020-05-05 DIAGNOSIS — E1122 Type 2 diabetes mellitus with diabetic chronic kidney disease: Secondary | ICD-10-CM | POA: Insufficient documentation

## 2020-05-05 DIAGNOSIS — F32A Depression, unspecified: Secondary | ICD-10-CM

## 2020-05-05 DIAGNOSIS — F419 Anxiety disorder, unspecified: Secondary | ICD-10-CM | POA: Diagnosis not present

## 2020-05-05 DIAGNOSIS — F17210 Nicotine dependence, cigarettes, uncomplicated: Secondary | ICD-10-CM | POA: Insufficient documentation

## 2020-05-05 DIAGNOSIS — I120 Hypertensive chronic kidney disease with stage 5 chronic kidney disease or end stage renal disease: Secondary | ICD-10-CM | POA: Insufficient documentation

## 2020-05-05 DIAGNOSIS — J449 Chronic obstructive pulmonary disease, unspecified: Secondary | ICD-10-CM | POA: Insufficient documentation

## 2020-05-05 DIAGNOSIS — Z794 Long term (current) use of insulin: Secondary | ICD-10-CM | POA: Diagnosis not present

## 2020-05-05 DIAGNOSIS — F329 Major depressive disorder, single episode, unspecified: Secondary | ICD-10-CM | POA: Diagnosis not present

## 2020-05-05 LAB — COMPREHENSIVE METABOLIC PANEL
ALT: 26 U/L (ref 0–44)
AST: 30 U/L (ref 15–41)
Albumin: 3.9 g/dL (ref 3.5–5.0)
Alkaline Phosphatase: 49 U/L (ref 38–126)
Anion gap: 13 (ref 5–15)
BUN: 61 mg/dL — ABNORMAL HIGH (ref 6–20)
CO2: 12 mmol/L — ABNORMAL LOW (ref 22–32)
Calcium: 8.6 mg/dL — ABNORMAL LOW (ref 8.9–10.3)
Chloride: 114 mmol/L — ABNORMAL HIGH (ref 98–111)
Creatinine, Ser: 9.09 mg/dL — ABNORMAL HIGH (ref 0.44–1.00)
GFR calc Af Amer: 5 mL/min — ABNORMAL LOW (ref >60)
GFR calc non Af Amer: 4 mL/min — ABNORMAL LOW (ref >60)
Glucose, Bld: 104 mg/dL — ABNORMAL HIGH (ref 70–99)
Potassium: 5.2 mmol/L — ABNORMAL HIGH (ref 3.5–5.1)
Sodium: 139 mmol/L (ref 135–145)
Total Bilirubin: 0.3 mg/dL (ref 0.3–1.2)
Total Protein: 7.9 g/dL (ref 6.5–8.1)

## 2020-05-05 LAB — CBC
HCT: 38.3 % (ref 36.0–46.0)
Hemoglobin: 11.8 g/dL — ABNORMAL LOW (ref 12.0–15.0)
MCH: 31.1 pg (ref 26.0–34.0)
MCHC: 30.8 g/dL (ref 30.0–36.0)
MCV: 101.1 fL — ABNORMAL HIGH (ref 80.0–100.0)
Platelets: 238 10*3/uL (ref 150–400)
RBC: 3.79 MIL/uL — ABNORMAL LOW (ref 3.87–5.11)
RDW: 15.3 % (ref 11.5–15.5)
WBC: 8.6 10*3/uL (ref 4.0–10.5)
nRBC: 0 % (ref 0.0–0.2)

## 2020-05-05 LAB — SALICYLATE LEVEL: Salicylate Lvl: <7 mg/dL — ABNORMAL LOW (ref 7.0–30.0)

## 2020-05-05 LAB — ACETAMINOPHEN LEVEL: Acetaminophen (Tylenol), Serum: <10 ug/mL — ABNORMAL LOW (ref 10–30)

## 2020-05-05 LAB — ETHANOL: Alcohol, Ethyl (B): <10 mg/dL (ref <10)

## 2020-05-05 NOTE — ED Notes (Signed)
Patient verbalizes understanding of discharge instructions. Opportunity for questioning and answers were provided. Armband removed by staff, pt discharged from ED ambulatory to [facility]

## 2020-05-05 NOTE — ED Triage Notes (Signed)
Pt states she has not had dialysis in 3 weeks, when asked why pt states "I need to speak with a psychiatrist" Pt denies SI/HI. States she has bad anxiety and wants to speak with a psychiatrists. Pt spoke with her dialysis center and they said they can schedule her for tomorrow if she does not need it here. Pt has no other complaints.

## 2020-05-05 NOTE — ED Provider Notes (Signed)
[facility] EMERGENCY DEPARTMENT Provider Note   CSN: [id number] Arrival date & time: 05/05/20  1233     History Chief Complaint  Patient presents with  . Needs Dialysis  . Anxiety    Tammy Moses is a 54 y.o. female who presents emergency department with depression and missing dialysis.  Patient has been on dialysis for a long time.  She has not been for the past 3 weeks because she has been very depressed.  She states that she is feeling like she just does not care about anything and wants to lay in her bed all the time.  She is a history of previous episodes of depression in her past and when she was young she did attempt overdose on aspirin.  She denies feeling suicidal, homicidal or having any audiovisual hallucinations.  She does not want to be admitted to a psychiatric hospital but does state that she feels like she needs some help with her anxiety and depression.  Patient also denies having any shortness of breath.  This lady is only 37 is a little older than the  HPI     Past Medical History:  Diagnosis Date  . Anemia   . Anxiety   . Blind right eye   . Constipation, chronic   . Dental caries   . Diabetes mellitus   . Diabetic neuropathy ([place])   . Diabetic retinopathy   . GERD (gastroesophageal reflux disease)   . Glaucoma   . H. pylori infection   . Hepatitis C carrier ([place])   . High risk sexual behavior   . Hyperlipidemia   . Hypertension   . Hypoglycemia 07/12/2017  . Insomnia   . Microalbuminuria   . Nonspecific elevation of levels of transaminase or lactic acid dehydrogenase (LDH)   . Tobacco dependence   . Vitamin D deficiency     Patient Active Problem List   Diagnosis Date Noted  . Impaired functional mobility and activity tolerance 01/04/2019  . Acute renal failure superimposed on stage 5 chronic kidney disease, not on chronic dialysis ([place]) 10/05/2017  . Pneumonia of both lungs due to infectious organism 10/05/2017  . Fall   .  Hypoglycemia 07/12/2017  . CKD (chronic kidney disease) stage 5, GFR less than 15 ml/min (HCC) 07/12/2017  . Anemia 07/12/2017  . Alcoholic intoxication without complication ([place])   . COPD (chronic obstructive pulmonary disease) ([place]) 06/24/2017  . ESRD (end stage renal disease) on dialysis ([place]) 06/17/2017  . Foot drop 05/26/2017  . Carpal tunnel syndrome of left wrist 05/20/2017  . 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 Poor compliance with medication 05/17/2017  . Peripheral neuropathy 05/10/2017  . Diabetes mellitus ([place]) 05/10/2017  . 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  . Vitamin D deficiency 04/07/2017  . Absolute glaucoma of right eye 12/27/2016  . Chronic angle-closure glaucoma of left eye, severe stage 12/27/2016  . PCO (posterior capsule opacification), left 12/27/2016  . Gait abnormality 03/07/2014  . Abnormality of gait 03/07/2014  . Hepatitis C virus infection without hepatic coma 03/04/2014  . History of hepatitis C 03/04/2014  . Erosive gastritis 02/14/2014  . Pseudophakia of left eye 10/30/2012  . Asthma 10/13/2012  . Reflux 10/13/2012  . Lens replaced by other means 10/12/2012  . Primary open angle glaucoma 10/12/2012  . Encounter for screening for diabetes mellitus 10/09/2012  . 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 BACK PAIN, LUMBAR 08/18/2010  . IDDM 08/11/2010  .  HYPERCHOLESTEROLEMIA 08/11/2010  . HYPOKALEMIA 08/11/2010  . DEPRESSION 08/11/2010  . GLAUCOMA 08/11/2010  . BLINDNESS, RIGHT EYE 08/11/2010  . Essential hypertension 08/11/2010  . GERD 08/11/2010  . CONSTIPATION 08/06/2009    Past Surgical History:  Procedure Laterality Date  . ABDOMINAL HYSTERECTOMY  04/30/2009   PARTIAL  . COLONOSCOPY    . DIALYSIS FISTULA CREATION Left 06/2017  . ESOPHAGOGASTRODUODENOSCOPY       OB History   No obstetric history on file.     Family History  Problem Relation Age of Onset  . High blood  pressure Mother   . Diabetes Mother   . Thyroid disease Mother   . Diabetes Father   . High blood pressure Father     Social History   Tobacco Use  . Smoking status: Current Every Day Smoker    Packs/day: 0.25    Types: Cigarettes  . Smokeless tobacco: Never Used  . Tobacco comment: trying to quit  Vaping Use  . Vaping Use: Never used  Substance Use Topics  . Alcohol use: Yes    Comment: 2-3 beers weekly  . Drug use: Not Currently    Types: Marijuana, Cocaine    Comment: quit 3 months ago    Home Medications Prior to Admission medications   Medication Sig Start Date End Date Taking? Authorizing Provider  albuterol (VENTOLIN HFA) 108 (90 Base) MCG/ACT inhaler Inhale 2 puffs into the lungs every 4 (four) hours as needed for shortness of breath. For shortness of breath 12/11/19   [name], FNP  amLODipine (NORVASC) 10 MG tablet Take 1 tablet (10 mg total) by mouth daily. 12/11/19   [name], FNP  atropine 1 % ophthalmic solution Place 1 drop into both eyes 2 (two) times daily.    [provider]  blood glucose meter kit and supplies KIT Dispense based on patient and insurance preference. Use up to four times daily as directed. (FOR ICD-10 E.11.9) 11/30/17   Scot Jun, FNP  buPROPion [facility] SR) 150 MG 12 hr tablet Take 1 tablet (150 mg total) by mouth 2 (two) times daily. 12/11/19   [name], FNP  calcium acetate (PHOSLO) 667 MG capsule Take 2,001 mg by mouth 3 (three) times daily with meals. 07/06/18   [provider]  Cholecalciferol (VITAMIN D-3) 1000 units CAPS Take 2 capsules (2,000 Units total) by mouth daily. 12/13/17   Scot Jun, FNP  furosemide (LASIX) 40 MG tablet Take 1 tablet (40 mg total) by mouth daily. 12/11/19   [name], FNP  gabapentin (NEURONTIN) 600 MG tablet Take 1 tablet (600 mg total) by mouth 2 (two) times daily. 12/11/19   [name], FNP  glucose blood (ONE TOUCH ULTRA TEST) test  strip USE TO TEST 3 TIMES A DAY 07/17/19   [name], FNP  hydrALAZINE (APRESOLINE) 50 MG tablet Take 1 tablet (50 mg total) by mouth in the morning and at bedtime. 12/11/19   [name], FNP  Insulin Glargine (LANTUS SOLOSTAR) 100 UNIT/ML Solostar Pen Inject 20 Units into the skin daily at 10 pm. 12/11/19   [name], FNP  Insulin Pen Needle 29G X 12MM MISC E11.9 Dx Code Use once at bedtime with each nightly insulin 03/11/17   Scot Jun, FNP  ketotifen (ZADITOR) 0.025 % ophthalmic solution Place 1 drop into both eyes 2 (two) times daily. 03/30/16   Janne Napoleon, NP  Lancets [facility] ULTRASOFT) lancets Use as instructed 07/10/19  [name], FNP  omeprazole (PRILOSEC) 20 MG capsule Take 1 capsule (20 mg total) by mouth daily. Has to be taken at the same time with Harvoni FASTED 12/11/19   [name], FNP  oxyCODONE (ROXICODONE) 5 MG immediate release tablet Take 0.5 tablets (2.5 mg total) by mouth every 4 (four) hours as needed for severe pain. 03/17/20   Deno Etienne, DO  pravastatin (PRAVACHOL) 80 MG tablet Take 1 tablet (80 mg total) by mouth daily. 12/11/19   [name], FNP  predniSONE (DELTASONE) 20 MG tablet 2 tabs po daily x 4 days 03/17/20   Deno Etienne, DO  timolol (TIMOPTIC) 0.5 % ophthalmic solution Place 1 drop into the left eye at bedtime.  05/23/17   [provider]  Travoprost, BAK Free, (TRAVATAN) 0.004 % SOLN ophthalmic solution Place 1 drop into both eyes at bedtime.    [provider]  traZODone (DESYREL) 150 MG tablet Take 1 tablet (150 mg total) by mouth at bedtime. 12/11/19   [name], FNP    Allergies    Acyclovir and related  Review of Systems   Review of Systems Ten systems reviewed and are negative for acute change, except as noted in the HPI.   Physical Exam Updated Vital Signs BP (!) 198/98 (BP Location: Right Arm)   Pulse 89   Temp 98.6 F (37 C) (Oral)   Resp 16   Ht 5' 2"  (1.575 m)   Wt 59.4  kg   SpO2 100%   BMI 23.96 kg/m   Physical Exam Vitals and nursing note reviewed.  Constitutional:      General: She is not in acute distress.    Appearance: She is well-developed. She is not ill-appearing or diaphoretic.  HENT:     Head: Normocephalic and atraumatic.  Eyes:     General: No scleral icterus.    Comments: R corneal opacity   Neck:     Comments: NO jvd Cardiovascular:     Rate and Rhythm: Normal rate and regular rhythm.     Heart sounds: Normal heart sounds. No murmur heard.  No friction rub. No gallop.   Pulmonary:     Effort: Pulmonary effort is normal. No respiratory distress.     Breath sounds: Normal breath sounds. No rales.  Abdominal:     General: Bowel sounds are normal. There is no distension.     Palpations: Abdomen is soft. There is no mass.     Tenderness: There is no abdominal tenderness. There is no guarding.  Musculoskeletal:     Cervical back: Normal range of motion.  Skin:    General: Skin is warm and dry.  Neurological:     Mental Status: She is alert and oriented to person, place, and time.  Psychiatric:        Behavior: Behavior normal.     ED Results / Procedures / Treatments   Labs (all labs ordered are listed, but only abnormal results are displayed) Labs Reviewed  COMPREHENSIVE METABOLIC PANEL - Abnormal; Notable for the following components:      Result Value   Potassium 5.2 (*)    Chloride 114 (*)    CO2 12 (*)    Glucose, Bld 104 (*)    BUN 61 (*)    Creatinine, Ser 9.09 (*)    Calcium 8.6 (*)    GFR calc non Af Amer 4 (*)    GFR calc Af Amer 5 (*)    All other  components within normal limits  SALICYLATE LEVEL - Abnormal; Notable for the following components:   Salicylate Lvl <0.0 (*)    All other components within normal limits  ACETAMINOPHEN LEVEL - Abnormal; Notable for the following components:   Acetaminophen (Tylenol), Serum <10 (*)    All other components within normal limits  CBC - Abnormal; Notable for the  following components:   RBC 3.79 (*)    Hemoglobin 11.8 (*)    MCV 101.1 (*)    All other components within normal limits  ETHANOL    EKG EKG Interpretation  Date/Time:  Monday May 05 2020 12:54:58 EDT Ventricular Rate:  87 PR Interval:  172 QRS Duration: 82 QT Interval:  382 QTC Calculation: 459 R Axis:   35 Text Interpretation: Normal sinus rhythm No significant change since last tracing Confirmed by Lajean Saver [phone]) on 05/05/2020 6:10:54 PM   Radiology No results found.  Procedures Procedures (including critical care time)  Medications Ordered in ED Medications - No data to display  ED Course  I have reviewed the triage vital signs and the nursing notes.  Pertinent labs & imaging results that were available during my care of the patient were reviewed by me and considered in my medical decision making (see chart for details).  Clinical Course as of May 05 1825  Mon May 05, 2020  1825 Case discussed with Dr. Tamala Julian of Nephrology- does not need emergent dialysis and can wait until tomorrow. I sent an inbox message to our Dialysis coordinator Terri Piedra to help coordinate this.   [AH]    Clinical Course User Index [AH] Margarita Mail, PA-C   MDM Rules/Calculators/A&P                          Patient here with depression.  She has not been to dialysis in 3 weeks.  I reviewed the patient's labs which showed creatinine of 9.09, BUN of 61, no anion gap acidosis.  Potassium at 5.2.  CBC without significant abnormality, normal ethanol salicylate and Tylenol levels.  Patient is not suicidal or homicidal.  Normal sinus rhythm at a rate of 87 on EKG without T wave abnormalities.  I discussed the case with Dr. Tamala Julian of nephrology who recommends outpatient dialysis tomorrow.  I also gave referral to the dialysis coordinator, Terri Piedra.  Case discussed with Dr. Ashok Cordia is agreeing with discharge at this time.  She was referred to the behavioral health urgent care.  Appears  appropriate for discharge at this time Final Clinical Impression(s) / ED Diagnoses Final diagnoses:  Depression, unspecified depression type  ESRD (end stage renal disease) [facility])    Rx / DC Orders ED Discharge Orders    None       Margarita Mail, PA-C 05/06/20 1616    Lajean Saver, MD 05/06/20 [place]

## 2020-05-05 NOTE — Discharge Instructions (Addendum)
I am sending you with a copy of your labs. Please call your dialysis clinic to schedule your dialysis tomorrow.  Return for any new or worsening symptoms

## 2020-05-05 NOTE — Care Management (Signed)
ED CM arranged [employer] transportation  service to transport patient home. Patient waiting in the ED lobby

## 2020-05-06 NOTE — Progress Notes (Signed)
Renal Navigator received Epic inbox message from A. Harris/ED PA requesting assistance with arranging OP HD today for patient who was seen in the ED yesterday and discharged after business hours.  Navigator has located her OP HD clinic as [facility] in [facility] and spoken with clinic Social Worker/S. Mosley. [name] states she spoke with patient yesterday and encouraged follow up with Medicaid Transportation to ensure transportation on Wednesday, since she has been a "no-show" so often. [name] confirmed that patient has not been to OP HD in nearly a month. [name] can offer a spot for HD today if patient can get there, however, patient will not be able to schedule Medicaid Transportation for same day, so will need to find a friend/family member to take her or pay for transportation. Navigator attempted to contact patient on number listed in Epic to assist her in arranging HD today if agreeable, but it went to voicemail. Patient's name identified on voicemail, so Navigator left message requesting a call back.   Alphonzo Cruise, [place] Renal Navigator [phone]

## 2020-05-07 DIAGNOSIS — I10 Essential (primary) hypertension: Secondary | ICD-10-CM | POA: Diagnosis not present

## 2020-05-07 DIAGNOSIS — N2581 Secondary hyperparathyroidism of renal origin: Secondary | ICD-10-CM | POA: Diagnosis not present

## 2020-05-07 DIAGNOSIS — D631 Anemia in chronic kidney disease: Secondary | ICD-10-CM | POA: Diagnosis not present

## 2020-05-07 DIAGNOSIS — N186 End stage renal disease: Secondary | ICD-10-CM | POA: Diagnosis not present

## 2020-05-07 DIAGNOSIS — D509 Iron deficiency anemia, unspecified: Secondary | ICD-10-CM | POA: Diagnosis not present

## 2020-05-07 DIAGNOSIS — E11621 Type 2 diabetes mellitus with foot ulcer: Secondary | ICD-10-CM | POA: Diagnosis not present

## 2020-05-14 DIAGNOSIS — N186 End stage renal disease: Secondary | ICD-10-CM | POA: Diagnosis not present

## 2020-05-14 DIAGNOSIS — I10 Essential (primary) hypertension: Secondary | ICD-10-CM | POA: Diagnosis not present

## 2020-05-14 DIAGNOSIS — D509 Iron deficiency anemia, unspecified: Secondary | ICD-10-CM | POA: Diagnosis not present

## 2020-05-14 DIAGNOSIS — D631 Anemia in chronic kidney disease: Secondary | ICD-10-CM | POA: Diagnosis not present

## 2020-05-14 DIAGNOSIS — N2581 Secondary hyperparathyroidism of renal origin: Secondary | ICD-10-CM | POA: Diagnosis not present

## 2020-05-16 DIAGNOSIS — D631 Anemia in chronic kidney disease: Secondary | ICD-10-CM | POA: Diagnosis not present

## 2020-05-16 DIAGNOSIS — N186 End stage renal disease: Secondary | ICD-10-CM | POA: Diagnosis not present

## 2020-05-16 DIAGNOSIS — I10 Essential (primary) hypertension: Secondary | ICD-10-CM | POA: Diagnosis not present

## 2020-05-16 DIAGNOSIS — D509 Iron deficiency anemia, unspecified: Secondary | ICD-10-CM | POA: Diagnosis not present

## 2020-05-16 DIAGNOSIS — N2581 Secondary hyperparathyroidism of renal origin: Secondary | ICD-10-CM | POA: Diagnosis not present

## 2020-05-19 DIAGNOSIS — D631 Anemia in chronic kidney disease: Secondary | ICD-10-CM | POA: Diagnosis not present

## 2020-05-19 DIAGNOSIS — N2581 Secondary hyperparathyroidism of renal origin: Secondary | ICD-10-CM | POA: Diagnosis not present

## 2020-05-19 DIAGNOSIS — N186 End stage renal disease: Secondary | ICD-10-CM | POA: Diagnosis not present

## 2020-05-19 DIAGNOSIS — D509 Iron deficiency anemia, unspecified: Secondary | ICD-10-CM | POA: Diagnosis not present

## 2020-05-19 DIAGNOSIS — I10 Essential (primary) hypertension: Secondary | ICD-10-CM | POA: Diagnosis not present

## 2020-05-21 DIAGNOSIS — H5203 Hypermetropia, bilateral: Secondary | ICD-10-CM | POA: Diagnosis not present

## 2020-05-24 DIAGNOSIS — Z992 Dependence on renal dialysis: Secondary | ICD-10-CM | POA: Diagnosis not present

## 2020-05-24 DIAGNOSIS — N186 End stage renal disease: Secondary | ICD-10-CM | POA: Diagnosis not present

## 2020-05-26 DIAGNOSIS — N2581 Secondary hyperparathyroidism of renal origin: Secondary | ICD-10-CM | POA: Diagnosis not present

## 2020-05-26 DIAGNOSIS — D631 Anemia in chronic kidney disease: Secondary | ICD-10-CM | POA: Diagnosis not present

## 2020-05-26 DIAGNOSIS — R58 Hemorrhage, not elsewhere classified: Secondary | ICD-10-CM | POA: Diagnosis not present

## 2020-05-26 DIAGNOSIS — N186 End stage renal disease: Secondary | ICD-10-CM | POA: Diagnosis not present

## 2020-05-26 DIAGNOSIS — D509 Iron deficiency anemia, unspecified: Secondary | ICD-10-CM | POA: Diagnosis not present

## 2020-06-02 DIAGNOSIS — D509 Iron deficiency anemia, unspecified: Secondary | ICD-10-CM | POA: Diagnosis not present

## 2020-06-02 DIAGNOSIS — R58 Hemorrhage, not elsewhere classified: Secondary | ICD-10-CM | POA: Diagnosis not present

## 2020-06-02 DIAGNOSIS — N186 End stage renal disease: Secondary | ICD-10-CM | POA: Diagnosis not present

## 2020-06-02 DIAGNOSIS — N2581 Secondary hyperparathyroidism of renal origin: Secondary | ICD-10-CM | POA: Diagnosis not present

## 2020-06-02 DIAGNOSIS — D631 Anemia in chronic kidney disease: Secondary | ICD-10-CM | POA: Diagnosis not present

## 2020-06-06 DIAGNOSIS — D631 Anemia in chronic kidney disease: Secondary | ICD-10-CM | POA: Diagnosis not present

## 2020-06-06 DIAGNOSIS — N2581 Secondary hyperparathyroidism of renal origin: Secondary | ICD-10-CM | POA: Diagnosis not present

## 2020-06-06 DIAGNOSIS — D509 Iron deficiency anemia, unspecified: Secondary | ICD-10-CM | POA: Diagnosis not present

## 2020-06-06 DIAGNOSIS — N186 End stage renal disease: Secondary | ICD-10-CM | POA: Diagnosis not present

## 2020-06-06 DIAGNOSIS — E11621 Type 2 diabetes mellitus with foot ulcer: Secondary | ICD-10-CM | POA: Diagnosis not present

## 2020-06-06 DIAGNOSIS — R58 Hemorrhage, not elsewhere classified: Secondary | ICD-10-CM | POA: Diagnosis not present

## 2020-06-11 DIAGNOSIS — D631 Anemia in chronic kidney disease: Secondary | ICD-10-CM | POA: Diagnosis not present

## 2020-06-11 DIAGNOSIS — R58 Hemorrhage, not elsewhere classified: Secondary | ICD-10-CM | POA: Diagnosis not present

## 2020-06-11 DIAGNOSIS — D509 Iron deficiency anemia, unspecified: Secondary | ICD-10-CM | POA: Diagnosis not present

## 2020-06-11 DIAGNOSIS — N186 End stage renal disease: Secondary | ICD-10-CM | POA: Diagnosis not present

## 2020-06-11 DIAGNOSIS — N2581 Secondary hyperparathyroidism of renal origin: Secondary | ICD-10-CM | POA: Diagnosis not present

## 2020-06-13 DIAGNOSIS — D631 Anemia in chronic kidney disease: Secondary | ICD-10-CM | POA: Diagnosis not present

## 2020-06-13 DIAGNOSIS — N2581 Secondary hyperparathyroidism of renal origin: Secondary | ICD-10-CM | POA: Diagnosis not present

## 2020-06-13 DIAGNOSIS — R58 Hemorrhage, not elsewhere classified: Secondary | ICD-10-CM | POA: Diagnosis not present

## 2020-06-13 DIAGNOSIS — D509 Iron deficiency anemia, unspecified: Secondary | ICD-10-CM | POA: Diagnosis not present

## 2020-06-13 DIAGNOSIS — N186 End stage renal disease: Secondary | ICD-10-CM | POA: Diagnosis not present

## 2020-06-18 DIAGNOSIS — D509 Iron deficiency anemia, unspecified: Secondary | ICD-10-CM | POA: Diagnosis not present

## 2020-06-18 DIAGNOSIS — D631 Anemia in chronic kidney disease: Secondary | ICD-10-CM | POA: Diagnosis not present

## 2020-06-18 DIAGNOSIS — N2581 Secondary hyperparathyroidism of renal origin: Secondary | ICD-10-CM | POA: Diagnosis not present

## 2020-06-18 DIAGNOSIS — N186 End stage renal disease: Secondary | ICD-10-CM | POA: Diagnosis not present

## 2020-06-18 DIAGNOSIS — R58 Hemorrhage, not elsewhere classified: Secondary | ICD-10-CM | POA: Diagnosis not present

## 2020-06-20 DIAGNOSIS — N2581 Secondary hyperparathyroidism of renal origin: Secondary | ICD-10-CM | POA: Diagnosis not present

## 2020-06-20 DIAGNOSIS — D631 Anemia in chronic kidney disease: Secondary | ICD-10-CM | POA: Diagnosis not present

## 2020-06-20 DIAGNOSIS — D509 Iron deficiency anemia, unspecified: Secondary | ICD-10-CM | POA: Diagnosis not present

## 2020-06-20 DIAGNOSIS — R58 Hemorrhage, not elsewhere classified: Secondary | ICD-10-CM | POA: Diagnosis not present

## 2020-06-20 DIAGNOSIS — N186 End stage renal disease: Secondary | ICD-10-CM | POA: Diagnosis not present

## 2020-06-23 DIAGNOSIS — N2581 Secondary hyperparathyroidism of renal origin: Secondary | ICD-10-CM | POA: Diagnosis not present

## 2020-06-23 DIAGNOSIS — D631 Anemia in chronic kidney disease: Secondary | ICD-10-CM | POA: Diagnosis not present

## 2020-06-23 DIAGNOSIS — N186 End stage renal disease: Secondary | ICD-10-CM | POA: Diagnosis not present

## 2020-06-23 DIAGNOSIS — R58 Hemorrhage, not elsewhere classified: Secondary | ICD-10-CM | POA: Diagnosis not present

## 2020-06-23 DIAGNOSIS — D509 Iron deficiency anemia, unspecified: Secondary | ICD-10-CM | POA: Diagnosis not present

## 2020-06-24 DIAGNOSIS — N186 End stage renal disease: Secondary | ICD-10-CM | POA: Diagnosis not present

## 2020-06-24 DIAGNOSIS — Z992 Dependence on renal dialysis: Secondary | ICD-10-CM | POA: Diagnosis not present

## 2020-07-07 DIAGNOSIS — E11621 Type 2 diabetes mellitus with foot ulcer: Secondary | ICD-10-CM | POA: Diagnosis not present

## 2020-07-07 DIAGNOSIS — N2581 Secondary hyperparathyroidism of renal origin: Secondary | ICD-10-CM | POA: Diagnosis not present

## 2020-07-07 DIAGNOSIS — N186 End stage renal disease: Secondary | ICD-10-CM | POA: Diagnosis not present

## 2020-07-07 DIAGNOSIS — D631 Anemia in chronic kidney disease: Secondary | ICD-10-CM | POA: Diagnosis not present

## 2020-07-14 DIAGNOSIS — D631 Anemia in chronic kidney disease: Secondary | ICD-10-CM | POA: Diagnosis not present

## 2020-07-14 DIAGNOSIS — N186 End stage renal disease: Secondary | ICD-10-CM | POA: Diagnosis not present

## 2020-07-14 DIAGNOSIS — N2581 Secondary hyperparathyroidism of renal origin: Secondary | ICD-10-CM | POA: Diagnosis not present

## 2020-07-18 DIAGNOSIS — N186 End stage renal disease: Secondary | ICD-10-CM | POA: Diagnosis not present

## 2020-07-18 DIAGNOSIS — N2581 Secondary hyperparathyroidism of renal origin: Secondary | ICD-10-CM | POA: Diagnosis not present

## 2020-07-18 DIAGNOSIS — D631 Anemia in chronic kidney disease: Secondary | ICD-10-CM | POA: Diagnosis not present

## 2020-07-23 DIAGNOSIS — N186 End stage renal disease: Secondary | ICD-10-CM | POA: Diagnosis not present

## 2020-07-23 DIAGNOSIS — N2581 Secondary hyperparathyroidism of renal origin: Secondary | ICD-10-CM | POA: Diagnosis not present

## 2020-07-23 DIAGNOSIS — D631 Anemia in chronic kidney disease: Secondary | ICD-10-CM | POA: Diagnosis not present

## 2020-07-24 DIAGNOSIS — Z992 Dependence on renal dialysis: Secondary | ICD-10-CM | POA: Diagnosis not present

## 2020-07-24 DIAGNOSIS — N186 End stage renal disease: Secondary | ICD-10-CM | POA: Diagnosis not present

## 2020-07-28 DIAGNOSIS — D631 Anemia in chronic kidney disease: Secondary | ICD-10-CM | POA: Diagnosis not present

## 2020-07-28 DIAGNOSIS — I10 Essential (primary) hypertension: Secondary | ICD-10-CM | POA: Diagnosis not present

## 2020-07-28 DIAGNOSIS — Z23 Encounter for immunization: Secondary | ICD-10-CM | POA: Diagnosis not present

## 2020-07-28 DIAGNOSIS — N2581 Secondary hyperparathyroidism of renal origin: Secondary | ICD-10-CM | POA: Diagnosis not present

## 2020-07-28 DIAGNOSIS — N186 End stage renal disease: Secondary | ICD-10-CM | POA: Diagnosis not present

## 2020-08-04 ENCOUNTER — Other Ambulatory Visit: Payer: Self-pay | Admitting: Family Medicine

## 2020-08-04 DIAGNOSIS — N185 Chronic kidney disease, stage 5: Secondary | ICD-10-CM

## 2020-08-04 DIAGNOSIS — D631 Anemia in chronic kidney disease: Secondary | ICD-10-CM | POA: Diagnosis not present

## 2020-08-04 DIAGNOSIS — N186 End stage renal disease: Secondary | ICD-10-CM | POA: Diagnosis not present

## 2020-08-04 DIAGNOSIS — N2581 Secondary hyperparathyroidism of renal origin: Secondary | ICD-10-CM | POA: Diagnosis not present

## 2020-08-04 DIAGNOSIS — I10 Essential (primary) hypertension: Secondary | ICD-10-CM | POA: Diagnosis not present

## 2020-08-04 DIAGNOSIS — E11621 Type 2 diabetes mellitus with foot ulcer: Secondary | ICD-10-CM | POA: Diagnosis not present

## 2020-08-04 DIAGNOSIS — Z23 Encounter for immunization: Secondary | ICD-10-CM | POA: Diagnosis not present

## 2020-08-08 DIAGNOSIS — Z23 Encounter for immunization: Secondary | ICD-10-CM | POA: Diagnosis not present

## 2020-08-08 DIAGNOSIS — I10 Essential (primary) hypertension: Secondary | ICD-10-CM | POA: Diagnosis not present

## 2020-08-08 DIAGNOSIS — N2581 Secondary hyperparathyroidism of renal origin: Secondary | ICD-10-CM | POA: Diagnosis not present

## 2020-08-08 DIAGNOSIS — N186 End stage renal disease: Secondary | ICD-10-CM | POA: Diagnosis not present

## 2020-08-08 DIAGNOSIS — D631 Anemia in chronic kidney disease: Secondary | ICD-10-CM | POA: Diagnosis not present

## 2020-08-11 ENCOUNTER — Other Ambulatory Visit: Payer: Self-pay | Admitting: Family Medicine

## 2020-08-11 DIAGNOSIS — N185 Chronic kidney disease, stage 5: Secondary | ICD-10-CM

## 2020-08-11 DIAGNOSIS — E1122 Type 2 diabetes mellitus with diabetic chronic kidney disease: Secondary | ICD-10-CM

## 2020-08-12 ENCOUNTER — Other Ambulatory Visit: Payer: Self-pay

## 2020-08-12 ENCOUNTER — Ambulatory Visit (INDEPENDENT_AMBULATORY_CARE_PROVIDER_SITE_OTHER): Payer: Medicare Other | Admitting: Podiatry

## 2020-08-12 ENCOUNTER — Encounter: Payer: Self-pay | Admitting: Podiatry

## 2020-08-12 DIAGNOSIS — L84 Corns and callosities: Secondary | ICD-10-CM | POA: Diagnosis not present

## 2020-08-12 DIAGNOSIS — M79675 Pain in left toe(s): Secondary | ICD-10-CM | POA: Diagnosis not present

## 2020-08-12 DIAGNOSIS — B351 Tinea unguium: Secondary | ICD-10-CM | POA: Diagnosis not present

## 2020-08-12 DIAGNOSIS — E1142 Type 2 diabetes mellitus with diabetic polyneuropathy: Secondary | ICD-10-CM

## 2020-08-12 DIAGNOSIS — M79674 Pain in right toe(s): Secondary | ICD-10-CM

## 2020-08-12 DIAGNOSIS — E119 Type 2 diabetes mellitus without complications: Secondary | ICD-10-CM

## 2020-08-12 NOTE — Progress Notes (Signed)
ANNUAL DIABETIC FOOT EXAM  Subjective: Tammy Moses presents today for for diabetic foot evaluation, at risk foot care with history of diabetic neuropathy and painful callus(es) left foot and painful thick toenails that are difficult to trim. Painful toenails interfere with ambulation. Aggravating factors include wearing enclosed shoe gear. Pain is relieved with periodic professional debridement. Painful calluses are aggravated when weightbearing with and without shoegear. Pain is relieved with periodic professional debridement..  Patient relates 15 year h/o diabetes.  She also has ESRD and is on hemodialysis on MWF .  She sees Dr. Tracie Moses for nephrology.  Patient relates h/o foot wounds.   Patient's blood sugar was 99 mg/dl this morning.  She does have numbness and tingling in both feet and takes gabapentin for this.  Tammy Moses is patient's PCP. Last visit was 12/11/2019.  Past Medical History:  Diagnosis Date  . Anemia   . Anxiety   . Blind right eye   . Constipation, chronic   . Dental caries   . Diabetes mellitus   . Diabetic neuropathy ([place])   . Diabetic retinopathy   . GERD (gastroesophageal reflux disease)   . Glaucoma   . H. pylori infection   . Hepatitis C carrier ([place])   . High risk sexual behavior   . Hyperlipidemia   . Hypertension   . Hypoglycemia 07/12/2017  . Insomnia   . Microalbuminuria   . Nonspecific elevation of levels of transaminase or lactic acid dehydrogenase (LDH)   . Tobacco dependence   . Vitamin D deficiency     Patient Active Problem List   Diagnosis Date Noted  . Impaired functional mobility and activity tolerance 01/04/2019  . Acute renal failure superimposed on stage 5 chronic kidney disease, not on chronic dialysis ([place]) 10/05/2017  . Pneumonia of both lungs due to infectious organism 10/05/2017  . Fall   . Hypoglycemia 07/12/2017  . CKD (chronic kidney disease) stage 5, GFR less than 15 ml/min (HCC)  07/12/2017  . Anemia 07/12/2017  . Alcoholic intoxication without complication ([place])   . COPD (chronic obstructive pulmonary disease) ([place]) 06/24/2017  . ESRD (end stage renal disease) on dialysis ([place]) 06/17/2017  . Foot drop 05/26/2017  . Carpal tunnel syndrome of left wrist 05/20/2017  . 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 Poor compliance with medication 05/17/2017  . Peripheral neuropathy 05/10/2017  . Diabetes mellitus ([place]) 05/10/2017  . 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  . Vitamin D deficiency 04/07/2017  . Absolute glaucoma of right eye 12/27/2016  . Chronic angle-closure glaucoma of left eye, severe stage 12/27/2016  . PCO (posterior capsule opacification), left 12/27/2016  . Gait abnormality 03/07/2014  . Abnormality of gait 03/07/2014  . Hepatitis C virus infection without hepatic coma 03/04/2014  . History of hepatitis C 03/04/2014  . Erosive gastritis 02/14/2014  . Pseudophakia of left eye 10/30/2012  . Asthma 10/13/2012  . Reflux 10/13/2012  . Lens replaced by other means 10/12/2012  . Primary open angle glaucoma 10/12/2012  . Encounter for screening for diabetes mellitus 10/09/2012  . 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 BACK PAIN, LUMBAR 08/18/2010  . IDDM 08/11/2010  . HYPERCHOLESTEROLEMIA 08/11/2010  . HYPOKALEMIA 08/11/2010  . DEPRESSION 08/11/2010  . GLAUCOMA 08/11/2010  . BLINDNESS, RIGHT EYE 08/11/2010  . Essential hypertension 08/11/2010  . GERD 08/11/2010  . CONSTIPATION 08/06/2009    Past Surgical History:  Procedure Laterality Date  . ABDOMINAL HYSTERECTOMY  04/30/2009  PARTIAL  . COLONOSCOPY    . DIALYSIS FISTULA CREATION Left 06/2017  . ESOPHAGOGASTRODUODENOSCOPY      Current Outpatient Medications on File Prior to Visit  Medication Sig Dispense Refill  . albuterol (VENTOLIN HFA) 108 (90 Base) MCG/ACT inhaler Inhale 2 puffs into the lungs every 4 (four) hours as needed for  shortness of breath. For shortness of breath 8 g 11  . amLODipine (NORVASC) 10 MG tablet Take 1 tablet (10 mg total) by mouth daily. 30 tablet 3  . atropine 1 % ophthalmic solution Place 1 drop into both eyes 2 (two) times daily.    . blood glucose meter kit and supplies KIT Dispense based on patient and insurance preference. Use up to four times daily as directed. (FOR ICD-10 E.11.9) 1 each 0  . buPROPion (WELLBUTRIN SR) 150 MG 12 hr tablet Take 1 tablet (150 mg total) by mouth 2 (two) times daily. 60 tablet 3  . calcium acetate (PHOSLO) 667 MG capsule Take 2,001 mg by mouth 3 (three) times daily with meals.    . Cholecalciferol (VITAMIN D-3) 1000 units CAPS Take 2 capsules (2,000 Units total) by mouth daily. 90 capsule 3  . furosemide (LASIX) 40 MG tablet Take 1 tablet (40 mg total) by mouth daily. 30 tablet 3  . gabapentin (NEURONTIN) 600 MG tablet Take 1 tablet (600 mg total) by mouth 2 (two) times daily. 180 tablet 3  . hydrALAZINE (APRESOLINE) 100 MG tablet Take 100 mg by mouth 2 (two) times daily.    . hydrALAZINE (APRESOLINE) 50 MG tablet Take 1 tablet (50 mg total) by mouth in the morning and at bedtime. 60 tablet 3  . Insulin Glargine (LANTUS SOLOSTAR) 100 UNIT/ML Solostar Pen Inject 20 Units into the skin daily at 10 pm. 5 pen PRN  . Insulin Pen Needle 29G X 12MM MISC E11.9 Dx Code Use once at bedtime with each nightly insulin 100 each 3  . ketotifen (ZADITOR) 0.025 % ophthalmic solution Place 1 drop into both eyes 2 (two) times daily. 5 mL 0  . Lancets (ONETOUCH ULTRASOFT) lancets Use as instructed 100 each 12  . omeprazole (PRILOSEC) 20 MG capsule Take 1 capsule (20 mg total) by mouth daily. Has to be taken at the same time with Harvoni FASTED 30 capsule 3  . oxyCODONE (ROXICODONE) 5 MG immediate release tablet Take 0.5 tablets (2.5 mg total) by mouth every 4 (four) hours as needed for severe pain. 4 tablet 0  . pravastatin (PRAVACHOL) 80 MG tablet Take 1 tablet (80 mg total) by mouth  daily. 30 tablet 3  . predniSONE (DELTASONE) 20 MG tablet 2 tabs po daily x 4 days 8 tablet 0  . timolol (TIMOPTIC) 0.5 % ophthalmic solution Place 1 drop into the left eye at bedtime.   11  . Travoprost, BAK Free, (TRAVATAN) 0.004 % SOLN ophthalmic solution Place 1 drop into both eyes at bedtime.    . traZODone (DESYREL) 150 MG tablet Take 1 tablet (150 mg total) by mouth at bedtime. 30 tablet 6   No current facility-administered medications on file prior to visit.     Allergies  Allergen Reactions  . Acyclovir And Related Itching    Social History   Occupational History    Employer: DISABLED    Comment: Disabled  Tobacco Use  . Smoking status: Current Every Day Smoker    Packs/day: 0.25    Types: Cigarettes  . Smokeless tobacco: Never Used  . Tobacco comment: trying to quit  Vaping Use  . Vaping Use: Never used  Substance and Sexual Activity  . Alcohol use: Yes    Comment: 2-3 beers weekly  . Drug use: Not Currently    Types: Marijuana, Cocaine    Comment: quit 3 months ago  . Sexual activity: Yes    Family History  Problem Relation Age of Onset  . High blood pressure Mother   . Diabetes Mother   . Thyroid disease Mother   . Diabetes Father   . High blood pressure Father     Immunization History  Administered Date(s) Administered  . Hepatitis A, Adult 03/04/2014  . Influenza Whole 08/25/2009, 07/15/2010  . Influenza,inj,Quad PF,6+ Mos 06/27/2014, 09/07/2016, 07/13/2017, 07/17/2019  . Influenza-Unspecified 06/27/2014, 09/07/2016, 07/13/2017  . Pneumococcal-Unspecified 06/08/2017  . Td 10/25/2004     Objective: There were no vitals filed for this visit.  Tammy Moses is a pleasant 54 y.o. female in NAD. AAO X 3.  Vascular Examination: Capillary fill time to digits <3 seconds b/l lower extremities. Faintly palpable DP pulse(s) b/l lower extremities. Nonpalpable PT pulse(s) b/l lower extremities. Pedal hair sparse. Lower extremity skin temperature gradient  within normal limits. No pain with calf compression b/l. No edema noted b/l lower extremities.  Dermatological Examination: Pedal skin with normal turgor, texture and tone bilaterally. No open wounds bilaterally. No interdigital macerations bilaterally. Toenails 1-5 b/l elongated, discolored, dystrophic, thickened, crumbly with subungual debris and tenderness to dorsal palpation. Hyperkeratotic lesion(s) plantar central arch left foot.  No erythema, no edema, no drainage, no flocculence.  Musculoskeletal Examination: Normal muscle strength 5/5 to all lower extremity muscle groups bilaterally. No pain crepitus or joint limitation noted with ROM b/l. No gross bony deformities bilaterally.  Footwear Assessment: Does the patient wear appropriate shoes? Yes. Does the patient need inserts/orthotics? No..  Neurological Examination: Pt has subjective symptoms of neuropathy. Protective sensation diminished with 10g monofilament b/l.  Hemoglobin A1C Latest Ref Rng & Units 12/11/2019  HGBA1C 4.0 - 5.6 % 5.3  Some recent data might be hidden   Assessment: 1. Pain due to onychomycosis of toenails of both feet   2. Callus   3. Diabetic peripheral neuropathy associated with type 2 diabetes mellitus ([place])   4. Encounter for diabetic foot exam ([place])     High Risk  Patient has one or more of the following: Loss of protective sensation Absent pedal pulses Severe Foot deformity History of foot ulcer  Plan: -Examined patient. -No new findings. No new orders. -Diabetic foot examination performed on today's visit. -Continue diabetic foot care principles. -Toenails 1-5 b/l were debrided in length and girth with sterile nail nippers and dremel without iatrogenic bleeding.  -Callus(es) left foot pared utilizing sterile scalpel blade without complication or incident. Total number debrided =1. -Patient to report any pedal injuries to medical professional immediately. -Patient to continue soft, supportive  shoe gear daily. -Patient/POA to call should there be question/concern in the interim.  Return in about 3 months (around 11/12/2020).  Marzetta Board, DPM

## 2020-08-13 DIAGNOSIS — D631 Anemia in chronic kidney disease: Secondary | ICD-10-CM | POA: Diagnosis not present

## 2020-08-13 DIAGNOSIS — N2581 Secondary hyperparathyroidism of renal origin: Secondary | ICD-10-CM | POA: Diagnosis not present

## 2020-08-13 DIAGNOSIS — N186 End stage renal disease: Secondary | ICD-10-CM | POA: Diagnosis not present

## 2020-08-13 DIAGNOSIS — Z23 Encounter for immunization: Secondary | ICD-10-CM | POA: Diagnosis not present

## 2020-08-13 DIAGNOSIS — I10 Essential (primary) hypertension: Secondary | ICD-10-CM | POA: Diagnosis not present

## 2020-08-15 ENCOUNTER — Other Ambulatory Visit: Payer: Self-pay | Admitting: Family Medicine

## 2020-08-15 ENCOUNTER — Telehealth: Payer: Self-pay | Admitting: Family Medicine

## 2020-08-15 DIAGNOSIS — N185 Chronic kidney disease, stage 5: Secondary | ICD-10-CM

## 2020-08-15 DIAGNOSIS — E1122 Type 2 diabetes mellitus with diabetic chronic kidney disease: Secondary | ICD-10-CM

## 2020-08-15 DIAGNOSIS — D631 Anemia in chronic kidney disease: Secondary | ICD-10-CM | POA: Diagnosis not present

## 2020-08-15 DIAGNOSIS — Z23 Encounter for immunization: Secondary | ICD-10-CM | POA: Diagnosis not present

## 2020-08-15 DIAGNOSIS — N2581 Secondary hyperparathyroidism of renal origin: Secondary | ICD-10-CM | POA: Diagnosis not present

## 2020-08-15 DIAGNOSIS — N186 End stage renal disease: Secondary | ICD-10-CM | POA: Diagnosis not present

## 2020-08-15 DIAGNOSIS — I10 Essential (primary) hypertension: Secondary | ICD-10-CM | POA: Diagnosis not present

## 2020-08-15 MED ORDER — GLUCOSE BLOOD VI STRP: ORAL_STRIP | 11 refills | Status: DC

## 2020-08-18 DIAGNOSIS — Z23 Encounter for immunization: Secondary | ICD-10-CM | POA: Diagnosis not present

## 2020-08-18 DIAGNOSIS — I10 Essential (primary) hypertension: Secondary | ICD-10-CM | POA: Diagnosis not present

## 2020-08-18 DIAGNOSIS — N2581 Secondary hyperparathyroidism of renal origin: Secondary | ICD-10-CM | POA: Diagnosis not present

## 2020-08-18 DIAGNOSIS — D631 Anemia in chronic kidney disease: Secondary | ICD-10-CM | POA: Diagnosis not present

## 2020-08-18 DIAGNOSIS — N186 End stage renal disease: Secondary | ICD-10-CM | POA: Diagnosis not present

## 2020-08-18 NOTE — Telephone Encounter (Signed)
Done

## 2020-08-20 DIAGNOSIS — D631 Anemia in chronic kidney disease: Secondary | ICD-10-CM | POA: Diagnosis not present

## 2020-08-20 DIAGNOSIS — Z23 Encounter for immunization: Secondary | ICD-10-CM | POA: Diagnosis not present

## 2020-08-20 DIAGNOSIS — I10 Essential (primary) hypertension: Secondary | ICD-10-CM | POA: Diagnosis not present

## 2020-08-20 DIAGNOSIS — N186 End stage renal disease: Secondary | ICD-10-CM | POA: Diagnosis not present

## 2020-08-20 DIAGNOSIS — N2581 Secondary hyperparathyroidism of renal origin: Secondary | ICD-10-CM | POA: Diagnosis not present

## 2020-08-22 DIAGNOSIS — N2581 Secondary hyperparathyroidism of renal origin: Secondary | ICD-10-CM | POA: Diagnosis not present

## 2020-08-22 DIAGNOSIS — N186 End stage renal disease: Secondary | ICD-10-CM | POA: Diagnosis not present

## 2020-08-22 DIAGNOSIS — D631 Anemia in chronic kidney disease: Secondary | ICD-10-CM | POA: Diagnosis not present

## 2020-08-22 DIAGNOSIS — Z23 Encounter for immunization: Secondary | ICD-10-CM | POA: Diagnosis not present

## 2020-08-22 DIAGNOSIS — I10 Essential (primary) hypertension: Secondary | ICD-10-CM | POA: Diagnosis not present

## 2020-08-24 DIAGNOSIS — N186 End stage renal disease: Secondary | ICD-10-CM | POA: Diagnosis not present

## 2020-08-24 DIAGNOSIS — Z992 Dependence on renal dialysis: Secondary | ICD-10-CM | POA: Diagnosis not present

## 2020-08-29 DIAGNOSIS — E11621 Type 2 diabetes mellitus with foot ulcer: Secondary | ICD-10-CM | POA: Diagnosis not present

## 2020-08-29 DIAGNOSIS — N2581 Secondary hyperparathyroidism of renal origin: Secondary | ICD-10-CM | POA: Diagnosis not present

## 2020-08-29 DIAGNOSIS — D631 Anemia in chronic kidney disease: Secondary | ICD-10-CM | POA: Diagnosis not present

## 2020-08-29 DIAGNOSIS — N186 End stage renal disease: Secondary | ICD-10-CM | POA: Diagnosis not present

## 2020-08-29 DIAGNOSIS — D509 Iron deficiency anemia, unspecified: Secondary | ICD-10-CM | POA: Diagnosis not present

## 2020-09-02 ENCOUNTER — Ambulatory Visit: Payer: Self-pay | Admitting: Family Medicine

## 2020-09-05 DIAGNOSIS — N2581 Secondary hyperparathyroidism of renal origin: Secondary | ICD-10-CM | POA: Diagnosis not present

## 2020-09-05 DIAGNOSIS — N186 End stage renal disease: Secondary | ICD-10-CM | POA: Diagnosis not present

## 2020-09-05 DIAGNOSIS — D631 Anemia in chronic kidney disease: Secondary | ICD-10-CM | POA: Diagnosis not present

## 2020-09-05 DIAGNOSIS — D509 Iron deficiency anemia, unspecified: Secondary | ICD-10-CM | POA: Diagnosis not present

## 2020-09-10 DIAGNOSIS — D509 Iron deficiency anemia, unspecified: Secondary | ICD-10-CM | POA: Diagnosis not present

## 2020-09-10 DIAGNOSIS — N186 End stage renal disease: Secondary | ICD-10-CM | POA: Diagnosis not present

## 2020-09-10 DIAGNOSIS — N2581 Secondary hyperparathyroidism of renal origin: Secondary | ICD-10-CM | POA: Diagnosis not present

## 2020-09-10 DIAGNOSIS — D631 Anemia in chronic kidney disease: Secondary | ICD-10-CM | POA: Diagnosis not present

## 2020-09-19 DIAGNOSIS — N186 End stage renal disease: Secondary | ICD-10-CM | POA: Diagnosis not present

## 2020-09-19 DIAGNOSIS — D509 Iron deficiency anemia, unspecified: Secondary | ICD-10-CM | POA: Diagnosis not present

## 2020-09-19 DIAGNOSIS — N2581 Secondary hyperparathyroidism of renal origin: Secondary | ICD-10-CM | POA: Diagnosis not present

## 2020-09-19 DIAGNOSIS — D631 Anemia in chronic kidney disease: Secondary | ICD-10-CM | POA: Diagnosis not present

## 2020-09-23 DIAGNOSIS — N186 End stage renal disease: Secondary | ICD-10-CM | POA: Diagnosis not present

## 2020-09-23 DIAGNOSIS — Z992 Dependence on renal dialysis: Secondary | ICD-10-CM | POA: Diagnosis not present

## 2020-09-24 DIAGNOSIS — N2581 Secondary hyperparathyroidism of renal origin: Secondary | ICD-10-CM | POA: Diagnosis not present

## 2020-09-24 DIAGNOSIS — N186 End stage renal disease: Secondary | ICD-10-CM | POA: Diagnosis not present

## 2020-09-24 DIAGNOSIS — E11621 Type 2 diabetes mellitus with foot ulcer: Secondary | ICD-10-CM | POA: Diagnosis not present

## 2020-09-24 DIAGNOSIS — D509 Iron deficiency anemia, unspecified: Secondary | ICD-10-CM | POA: Diagnosis not present

## 2020-09-24 DIAGNOSIS — D631 Anemia in chronic kidney disease: Secondary | ICD-10-CM | POA: Diagnosis not present

## 2020-09-29 DIAGNOSIS — N2581 Secondary hyperparathyroidism of renal origin: Secondary | ICD-10-CM | POA: Diagnosis not present

## 2020-09-29 DIAGNOSIS — D631 Anemia in chronic kidney disease: Secondary | ICD-10-CM | POA: Diagnosis not present

## 2020-09-29 DIAGNOSIS — N186 End stage renal disease: Secondary | ICD-10-CM | POA: Diagnosis not present

## 2020-09-29 DIAGNOSIS — D509 Iron deficiency anemia, unspecified: Secondary | ICD-10-CM | POA: Diagnosis not present

## 2020-10-03 DIAGNOSIS — D631 Anemia in chronic kidney disease: Secondary | ICD-10-CM | POA: Diagnosis not present

## 2020-10-03 DIAGNOSIS — N186 End stage renal disease: Secondary | ICD-10-CM | POA: Diagnosis not present

## 2020-10-03 DIAGNOSIS — N2581 Secondary hyperparathyroidism of renal origin: Secondary | ICD-10-CM | POA: Diagnosis not present

## 2020-10-03 DIAGNOSIS — D509 Iron deficiency anemia, unspecified: Secondary | ICD-10-CM | POA: Diagnosis not present

## 2020-10-13 DIAGNOSIS — E119 Type 2 diabetes mellitus without complications: Secondary | ICD-10-CM | POA: Diagnosis not present

## 2020-10-13 DIAGNOSIS — H401133 Primary open-angle glaucoma, bilateral, severe stage: Secondary | ICD-10-CM | POA: Diagnosis not present

## 2020-10-13 DIAGNOSIS — H26492 Other secondary cataract, left eye: Secondary | ICD-10-CM | POA: Diagnosis not present

## 2020-10-15 DIAGNOSIS — N2581 Secondary hyperparathyroidism of renal origin: Secondary | ICD-10-CM | POA: Diagnosis not present

## 2020-10-15 DIAGNOSIS — D631 Anemia in chronic kidney disease: Secondary | ICD-10-CM | POA: Diagnosis not present

## 2020-10-15 DIAGNOSIS — D509 Iron deficiency anemia, unspecified: Secondary | ICD-10-CM | POA: Diagnosis not present

## 2020-10-15 DIAGNOSIS — N186 End stage renal disease: Secondary | ICD-10-CM | POA: Diagnosis not present

## 2020-10-24 DIAGNOSIS — N186 End stage renal disease: Secondary | ICD-10-CM | POA: Diagnosis not present

## 2020-10-24 DIAGNOSIS — Z992 Dependence on renal dialysis: Secondary | ICD-10-CM | POA: Diagnosis not present

## 2020-10-29 DIAGNOSIS — D631 Anemia in chronic kidney disease: Secondary | ICD-10-CM | POA: Diagnosis not present

## 2020-10-29 DIAGNOSIS — E11621 Type 2 diabetes mellitus with foot ulcer: Secondary | ICD-10-CM | POA: Diagnosis not present

## 2020-10-29 DIAGNOSIS — N2581 Secondary hyperparathyroidism of renal origin: Secondary | ICD-10-CM | POA: Diagnosis not present

## 2020-10-29 DIAGNOSIS — N186 End stage renal disease: Secondary | ICD-10-CM | POA: Diagnosis not present

## 2020-10-29 DIAGNOSIS — I10 Essential (primary) hypertension: Secondary | ICD-10-CM | POA: Diagnosis not present

## 2020-10-31 DIAGNOSIS — I10 Essential (primary) hypertension: Secondary | ICD-10-CM | POA: Diagnosis not present

## 2020-10-31 DIAGNOSIS — D631 Anemia in chronic kidney disease: Secondary | ICD-10-CM | POA: Diagnosis not present

## 2020-10-31 DIAGNOSIS — N186 End stage renal disease: Secondary | ICD-10-CM | POA: Diagnosis not present

## 2020-10-31 DIAGNOSIS — N2581 Secondary hyperparathyroidism of renal origin: Secondary | ICD-10-CM | POA: Diagnosis not present

## 2020-11-10 DIAGNOSIS — I10 Essential (primary) hypertension: Secondary | ICD-10-CM | POA: Diagnosis not present

## 2020-11-10 DIAGNOSIS — D631 Anemia in chronic kidney disease: Secondary | ICD-10-CM | POA: Diagnosis not present

## 2020-11-10 DIAGNOSIS — N2581 Secondary hyperparathyroidism of renal origin: Secondary | ICD-10-CM | POA: Diagnosis not present

## 2020-11-10 DIAGNOSIS — N186 End stage renal disease: Secondary | ICD-10-CM | POA: Diagnosis not present

## 2020-11-19 ENCOUNTER — Ambulatory Visit: Payer: Medicare Other | Admitting: Podiatry

## 2020-12-10 ENCOUNTER — Emergency Department (HOSPITAL_COMMUNITY)
Admission: EM | Admit: 2020-12-10 | Discharge: 2020-12-10 | Disposition: A | Discharge status: Home / Self Care | Payer: Medicare Other | Attending: Emergency Medicine | Admitting: Emergency Medicine

## 2020-12-10 ENCOUNTER — Emergency Department (HOSPITAL_COMMUNITY): Payer: Medicare Other

## 2020-12-10 DIAGNOSIS — R079 Chest pain, unspecified: Secondary | ICD-10-CM | POA: Diagnosis not present

## 2020-12-10 DIAGNOSIS — Z79899 Other long term (current) drug therapy: Secondary | ICD-10-CM | POA: Diagnosis not present

## 2020-12-10 DIAGNOSIS — I120 Hypertensive chronic kidney disease with stage 5 chronic kidney disease or end stage renal disease: Secondary | ICD-10-CM | POA: Diagnosis not present

## 2020-12-10 DIAGNOSIS — F17210 Nicotine dependence, cigarettes, uncomplicated: Secondary | ICD-10-CM | POA: Insufficient documentation

## 2020-12-10 DIAGNOSIS — Z9115 Patient's noncompliance with renal dialysis: Secondary | ICD-10-CM

## 2020-12-10 DIAGNOSIS — Z794 Long term (current) use of insulin: Secondary | ICD-10-CM | POA: Insufficient documentation

## 2020-12-10 DIAGNOSIS — J45909 Unspecified asthma, uncomplicated: Secondary | ICD-10-CM | POA: Diagnosis not present

## 2020-12-10 DIAGNOSIS — N186 End stage renal disease: Secondary | ICD-10-CM | POA: Insufficient documentation

## 2020-12-10 DIAGNOSIS — E1140 Type 2 diabetes mellitus with diabetic neuropathy, unspecified: Secondary | ICD-10-CM | POA: Insufficient documentation

## 2020-12-10 DIAGNOSIS — R0789 Other chest pain: Secondary | ICD-10-CM

## 2020-12-10 DIAGNOSIS — E11319 Type 2 diabetes mellitus with unspecified diabetic retinopathy without macular edema: Secondary | ICD-10-CM | POA: Diagnosis not present

## 2020-12-10 DIAGNOSIS — Z992 Dependence on renal dialysis: Secondary | ICD-10-CM | POA: Insufficient documentation

## 2020-12-10 DIAGNOSIS — J449 Chronic obstructive pulmonary disease, unspecified: Secondary | ICD-10-CM | POA: Insufficient documentation

## 2020-12-10 LAB — CBC
HCT: 43.4 % (ref 36.0–46.0)
Hemoglobin: 13.4 g/dL (ref 12.0–15.0)
MCH: 29.8 pg (ref 26.0–34.0)
MCHC: 30.9 g/dL (ref 30.0–36.0)
MCV: 96.7 fL (ref 80.0–100.0)
Platelets: 174 10*3/uL (ref 150–400)
RBC: 4.49 MIL/uL (ref 3.87–5.11)
RDW: 15.1 % (ref 11.5–15.5)
WBC: 6.8 10*3/uL (ref 4.0–10.5)
nRBC: 0 % (ref 0.0–0.2)

## 2020-12-10 LAB — I-STAT BETA HCG BLOOD, ED (MC, WL, AP ONLY): I-stat hCG, quantitative: <5 m[IU]/mL (ref <5)

## 2020-12-10 LAB — BASIC METABOLIC PANEL
Anion gap: 13 (ref 5–15)
BUN: 80 mg/dL — ABNORMAL HIGH (ref 6–20)
CO2: 10 mmol/L — ABNORMAL LOW (ref 22–32)
Calcium: 8.5 mg/dL — ABNORMAL LOW (ref 8.9–10.3)
Chloride: 114 mmol/L — ABNORMAL HIGH (ref 98–111)
Creatinine, Ser: 10.49 mg/dL — ABNORMAL HIGH (ref 0.44–1.00)
GFR, Estimated: 4 mL/min — ABNORMAL LOW (ref >60)
Glucose, Bld: 105 mg/dL — ABNORMAL HIGH (ref 70–99)
Potassium: 4.1 mmol/L (ref 3.5–5.1)
Sodium: 137 mmol/L (ref 135–145)

## 2020-12-10 LAB — TROPONIN I (HIGH SENSITIVITY)
Troponin I (High Sensitivity): 10 ng/L (ref <18)
Troponin I (High Sensitivity): 11 ng/L (ref <18)

## 2020-12-10 MED ORDER — LABETALOL HCL 5 MG/ML IV SOLN
20.0000 mg | Freq: Once | INTRAVENOUS | Status: AC
  Administered 2020-12-10: 20 mg via INTRAVENOUS
  Filled 2020-12-10: qty 4

## 2020-12-10 NOTE — Discharge Instructions (Addendum)
It is very important for you to show up to your dialysis session on a regular basis to prevent worsening of your health.  Your blood pressure is high today, please take your blood pressure medications and have your blood pressure recheck in the next several days.  The dialysis center will contact you if they have a spot available tomorrow.  Otherwise please show up on Friday as per your usual time.

## 2020-12-10 NOTE — ED Triage Notes (Signed)
Her family advised her to come to the ER because she has not been going to dialysis she reports for over 1 month. She told me "I just rather die than do that". She is also having some chest pain.

## 2020-12-10 NOTE — ED Provider Notes (Signed)
[facility] EMERGENCY DEPARTMENT Provider Note   CSN: [id number] Arrival date & time: 12/10/20  7253     History Chief Complaint  Patient presents with  . Chest Pain    Tammy Moses is a 55 y.o. female.  The history is provided by the patient and medical records. No language interpreter was used.  Chest Pain    55 year old female with history of end-stage renal disease currently on Monday Wednesday Friday dialysis, diabetes, medical noncompliance brought here via EMS from home for concerns of not going to dialysis.  Patient report a caregiver encouraged patient to come to the ER due to concerns of her not showing up for dialysis for quite a while.  Patient states she has not gone to dialysis for a month.  She mention she is feeling fine.  She endorsed occasional pain in her chest.  She describes a sharp pain to the right upper chest that usually lasting for less than 5 minutes and can happen sporadically.  No associated lightheadedness dizziness shortness of breath with this pain.  No fever or chills.  No nausea or diaphoresis.  She does have high blood pressure but does take a medication.  She has had 1 Covid vaccine only.  She denies having any active chest pain.  Last time she experienced chest pain was a few days ago.  Pain has been sporadic within the past 2 weeks.  She reports she does make urine and "urinate a lot".  Past Medical History:  Diagnosis Date  . Anemia   . Anxiety   . Blind right eye   . Constipation, chronic   . Dental caries   . Diabetes mellitus   . Diabetic neuropathy ([place])   . Diabetic retinopathy   . GERD (gastroesophageal reflux disease)   . Glaucoma   . H. pylori infection   . Hepatitis C carrier ([place])   . High risk sexual behavior   . Hyperlipidemia   . Hypertension   . Hypoglycemia 07/12/2017  . Insomnia   . Microalbuminuria   . Nonspecific elevation of levels of transaminase or lactic acid dehydrogenase (LDH)   . Tobacco  dependence   . Vitamin D deficiency     Patient Active Problem List   Diagnosis Date Noted  . Impaired functional mobility and activity tolerance 01/04/2019  . Acute renal failure superimposed on stage 5 chronic kidney disease, not on chronic dialysis ([place]) 10/05/2017  . Pneumonia of both lungs due to infectious organism 10/05/2017  . Fall   . Hypoglycemia 07/12/2017  . CKD (chronic kidney disease) stage 5, GFR less than 15 ml/min (H[facility]) 07/12/2017  . Anemia 07/12/2017  . Alcoholic intoxication without complication ([place])   . COPD (chronic obstructive pulmonary disease) ([place]) 06/24/2017  . ESRD (end stage renal disease) on dialysis ([place]) 06/17/2017  . Foot drop 05/26/2017  . Carpal tunnel syndrome of left wrist 05/20/2017  . 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 Poor compliance with medication 05/17/2017  . Peripheral neuropathy 05/10/2017  . Diabetes mellitus ([place]) 05/10/2017  . 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  . Vitamin D deficiency 04/07/2017  . Absolute glaucoma of right eye 12/27/2016  . Chronic angle-closure glaucoma of left eye, severe stage 12/27/2016  . PCO (posterior capsule opacification), left 12/27/2016  . Gait abnormality 03/07/2014  . Abnormality of gait 03/07/2014  . Hepatitis C virus infection without hepatic coma 03/04/2014  . History of hepatitis C 03/04/2014  . Erosive gastritis 02/14/2014  . Pseudophakia  of left eye 10/30/2012  . Asthma 10/13/2012  . Reflux 10/13/2012  . Lens replaced by other means 10/12/2012  . Primary open angle glaucoma 10/12/2012  . Encounter for screening for diabetes mellitus 10/09/2012  . 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 BACK PAIN, LUMBAR 08/18/2010  . IDDM 08/11/2010  . HYPERCHOLESTEROLEMIA 08/11/2010  . HYPOKALEMIA 08/11/2010  . DEPRESSION 08/11/2010  . GLAUCOMA 08/11/2010  . BLINDNESS, RIGHT EYE 08/11/2010  . Essential hypertension 08/11/2010  . GERD  08/11/2010  . CONSTIPATION 08/06/2009    Past Surgical History:  Procedure Laterality Date  . ABDOMINAL HYSTERECTOMY  04/30/2009   PARTIAL  . COLONOSCOPY    . DIALYSIS FISTULA CREATION Left 06/2017  . ESOPHAGOGASTRODUODENOSCOPY       OB History   No obstetric history on file.     Family History  Problem Relation Age of Onset  . High blood pressure Mother   . Diabetes Mother   . Thyroid disease Mother   . Diabetes Father   . High blood pressure Father     Social History   Tobacco Use  . Smoking status: Current Every Day Smoker    Packs/day: 0.25    Types: Cigarettes  . Smokeless tobacco: Never Used  . Tobacco comment: trying to quit  Vaping Use  . Vaping Use: Never used  Substance Use Topics  . Alcohol use: Yes    Comment: 2-3 beers weekly  . Drug use: Not Currently    Types: Marijuana, Cocaine    Comment: quit 3 months ago    Home Medications Prior to Admission medications   Medication Sig Start Date End Date Taking? Authorizing Provider  albuterol (VENTOLIN HFA) 108 (90 Base) MCG/ACT inhaler Inhale 2 puffs into the lungs every 4 (four) hours as needed for shortness of breath. For shortness of breath 12/11/19   [name], FNP  amLODipine (NORVASC) 10 MG tablet Take 1 tablet (10 mg total) by mouth daily. 12/11/19   [name], FNP  atropine 1 % ophthalmic solution Place 1 drop into both eyes 2 (two) times daily.    [provider]  blood glucose meter kit and supplies KIT Dispense based on patient and insurance preference. Use up to four times daily as directed. (FOR ICD-10 E.11.9) 11/30/17   Scot Jun, FNP  buPROPion [facility] SR) 150 MG 12 hr tablet Take 1 tablet (150 mg total) by mouth 2 (two) times daily. 12/11/19   [name], FNP  calcium acetate (PHOSLO) 667 MG capsule Take 2,001 mg by mouth 3 (three) times daily with meals. 07/06/18   [provider]  Cholecalciferol (VITAMIN D-3) 1000 units CAPS Take 2 capsules  (2,000 Units total) by mouth daily. 12/13/17   Scot Jun, FNP  furosemide (LASIX) 40 MG tablet Take 1 tablet (40 mg total) by mouth daily. 12/11/19   [name], FNP  gabapentin (NEURONTIN) 600 MG tablet Take 1 tablet (600 mg total) by mouth 2 (two) times daily. 12/11/19   [name], FNP  glucose blood (ONE TOUCH ULTRA TEST) test strip USE TO TEST 3 TIMES A DAY 08/15/20   [name], FNP  hydrALAZINE (APRESOLINE) 100 MG tablet Take 100 mg by mouth 2 (two) times daily. 05/19/20   [provider]  hydrALAZINE (APRESOLINE) 50 MG tablet Take 1 tablet (50 mg total) by mouth in the morning and at bedtime. 12/11/19   [name], FNP  Insulin Glargine (  LANTUS SOLOSTAR) 100 UNIT/ML Solostar Pen Inject 20 Units into the skin daily at 10 pm. 12/11/19   [name], FNP  Insulin Pen Needle 29G X 12MM MISC E11.9 Dx Code Use once at bedtime with each nightly insulin 03/11/17   Scot Jun, FNP  ketotifen (ZADITOR) 0.025 % ophthalmic solution Place 1 drop into both eyes 2 (two) times daily. 03/30/16   Janne Napoleon, NP  Lancets [facility] ULTRASOFT) lancets Use as instructed 07/10/19   [name], FNP  omeprazole (PRILOSEC) 20 MG capsule Take 1 capsule (20 mg total) by mouth daily. Has to be taken at the same time with [place] FASTED 12/11/19   [name], FNP  oxyCODONE (ROXICODONE) 5 MG immediate release tablet Take 0.5 tablets (2.5 mg total) by mouth every 4 (four) hours as needed for severe pain. 03/17/20   Deno Etienne, DO  pravastatin (PRAVACHOL) 80 MG tablet Take 1 tablet (80 mg total) by mouth daily. 12/11/19   [name], FNP  predniSONE (DELTASONE) 20 MG tablet 2 tabs po daily x 4 days 03/17/20   Deno Etienne, DO  timolol (TIMOPTIC) 0.5 % ophthalmic solution Place 1 drop into the left eye at bedtime.  05/23/17   [provider]  Travoprost, BAK Free, (TRAVATAN) 0.004 % SOLN ophthalmic solution Place 1 drop into both eyes at bedtime.     [provider]  traZODone (DESYREL) 150 MG tablet Take 1 tablet (150 mg total) by mouth at bedtime. 12/11/19   [name], FNP    Allergies    Acyclovir and related  Review of Systems   Review of Systems  Cardiovascular: Positive for chest pain.  All other systems reviewed and are negative.   Physical Exam Updated Vital Signs BP (!) 188/96 (BP Location: Right Arm)   Pulse 83   Temp 97.8 F (36.6 C) (Oral)   Resp 18   SpO2 100%   Physical Exam Vitals and nursing note reviewed.  Constitutional:      General: She is not in acute distress.    Appearance: She is well-developed and well-nourished.  HENT:     Head: Atraumatic.  Cardiovascular:     Rate and Rhythm: Normal rate and regular rhythm.     Pulses: Normal pulses.     Heart sounds: Normal heart sounds.  Pulmonary:     Effort: Pulmonary effort is normal.     Breath sounds: Normal breath sounds.  Abdominal:     General: Abdomen is flat.     Palpations: Abdomen is soft.     Tenderness: There is no abdominal tenderness.  Musculoskeletal:     Cervical back: Neck supple.  Skin:    Findings: No rash.     Comments: Left upper extremity AV fistula, functional with normal palpable thrill and bruit.  Neurological:     Mental Status: She is alert and oriented to person, place, and time.  Psychiatric:        Mood and Affect: Mood and affect and mood normal.     ED Results / Procedures / Treatments   Labs (all labs ordered are listed, but only abnormal results are displayed) Labs Reviewed  BASIC METABOLIC PANEL - Abnormal; Notable for the following components:      Result Value   Chloride 114 (*)    CO2 10 (*)    Glucose, Bld 105 (*)    BUN 80 (*)    Creatinine, Ser 10.49 (*)    Calcium 8.5 (*)  GFR, Estimated 4 (*)    All other components within normal limits  CBC  I-STAT BETA HCG BLOOD, ED ([facility], [facility], [facility] ONLY)  TROPONIN I (HIGH SENSITIVITY)  TROPONIN I (HIGH SENSITIVITY)    EKG EKG  Interpretation  Date/Time:  Wednesday December 10 2020 10:06:01 EST Ventricular Rate:  84 PR Interval:  168 QRS Duration: 88 QT Interval:  388 QTC Calculation: 458 R Axis:   65 Text Interpretation:  Poor data quality, interpretation may be adversely affected Normal sinus rhythm Septal infarct , age undetermined Possible Lateral infarct , age undetermined Abnormal ECG No significant change since last tracing Confirmed by Calvert Cantor [phone]) on 12/10/2020 1:34:57 PM   Radiology DG Chest 2 View  Result Date: 12/10/2020 CLINICAL DATA:  Chest pain EXAM: CHEST - 2 VIEW COMPARISON:  May 02, 2019 FINDINGS: Lungs are clear. Heart size and pulmonary vascularity are normal. No adenopathy. There is aortic atherosclerosis. No pneumothorax. No bone lesions. IMPRESSION: Lungs clear. Cardiac silhouette normal. Aortic Atherosclerosis (ICD10-I70.0). Electronically Signed   By: Lowella Grip III M.D.   On: 12/10/2020 10:49    Procedures Procedures   Medications Ordered in ED Medications  labetalol (NORMODYNE) injection 20 mg (20 mg Intravenous Given 12/10/20 1420)    ED Course  I have reviewed the triage vital signs and the nursing notes.  Pertinent labs & imaging results that were available during my care of the patient were reviewed by me and considered in my medical decision making (see chart for details).    MDM Rules/Calculators/A&P                          BP (!) 171/93   Pulse 72   Temp 97.8 F (36.6 C) (Oral)   Resp 14   SpO2 100%   Final Clinical Impression(s) / ED Diagnoses Final diagnoses:  Dialysis patient, noncompliant ([place])  Atypical chest pain    Rx / DC Orders ED Discharge Orders    None     1:42 PM Patient here due to missing dialysis for more than a month due to noncompliance.  She still make urine.She endorsed occasional atypical chest pain.  No active chest pain at this time.  Labs show evidence of chronic kidney disease but normal potassium level, and a  chest x-ray that is clear.  She endorsed some occasional atypical chest pain not consistent with ACS.  EKG is nonspecific but troponin is normal.  Patient found to be hypertensive with systolic blood pressure 423.  Will give labetalol 20 mg.  Plan to consult nephrology to have patient follow-up for resuming dialysis.  [facility] in [place], Dr. Neta Ehlers.  I appreciate consultation over the phone with Dr. Neta Ehlers who will contact dialysis clinic to reserve a spot for patient either tomorrow or Friday to get dialyzed. Encourage patient to show up as appropriate otherwise she may not be able to return to her facility for dialysis due to noncompliance.  At this time she does not meet criteria for hospital admission for analysis.  I have discussed this with our hospital on-call nephrologist Dr. Posey Pronto.   Domenic Moras, PA-C 12/10/20 1509    Truddie Hidden, MD 12/10/20 1539

## 2020-12-12 DIAGNOSIS — N186 End stage renal disease: Secondary | ICD-10-CM | POA: Diagnosis not present

## 2020-12-12 DIAGNOSIS — D631 Anemia in chronic kidney disease: Secondary | ICD-10-CM | POA: Diagnosis not present

## 2020-12-12 DIAGNOSIS — E11621 Type 2 diabetes mellitus with foot ulcer: Secondary | ICD-10-CM | POA: Diagnosis not present

## 2020-12-12 DIAGNOSIS — N2581 Secondary hyperparathyroidism of renal origin: Secondary | ICD-10-CM | POA: Diagnosis not present

## 2020-12-15 DIAGNOSIS — N186 End stage renal disease: Secondary | ICD-10-CM | POA: Diagnosis not present

## 2020-12-15 DIAGNOSIS — N2581 Secondary hyperparathyroidism of renal origin: Secondary | ICD-10-CM | POA: Diagnosis not present

## 2020-12-15 DIAGNOSIS — D631 Anemia in chronic kidney disease: Secondary | ICD-10-CM | POA: Diagnosis not present

## 2020-12-19 DIAGNOSIS — N2581 Secondary hyperparathyroidism of renal origin: Secondary | ICD-10-CM | POA: Diagnosis not present

## 2020-12-19 DIAGNOSIS — N186 End stage renal disease: Secondary | ICD-10-CM | POA: Diagnosis not present

## 2020-12-19 DIAGNOSIS — D631 Anemia in chronic kidney disease: Secondary | ICD-10-CM | POA: Diagnosis not present

## 2020-12-22 DIAGNOSIS — Z992 Dependence on renal dialysis: Secondary | ICD-10-CM | POA: Diagnosis not present

## 2020-12-22 DIAGNOSIS — N186 End stage renal disease: Secondary | ICD-10-CM | POA: Diagnosis not present

## 2020-12-24 DIAGNOSIS — N186 End stage renal disease: Secondary | ICD-10-CM | POA: Diagnosis not present

## 2020-12-24 DIAGNOSIS — E11621 Type 2 diabetes mellitus with foot ulcer: Secondary | ICD-10-CM | POA: Diagnosis not present

## 2020-12-24 DIAGNOSIS — D631 Anemia in chronic kidney disease: Secondary | ICD-10-CM | POA: Diagnosis not present

## 2020-12-24 DIAGNOSIS — D509 Iron deficiency anemia, unspecified: Secondary | ICD-10-CM | POA: Diagnosis not present

## 2020-12-24 DIAGNOSIS — N2581 Secondary hyperparathyroidism of renal origin: Secondary | ICD-10-CM | POA: Diagnosis not present

## 2020-12-29 DIAGNOSIS — D509 Iron deficiency anemia, unspecified: Secondary | ICD-10-CM | POA: Diagnosis not present

## 2020-12-29 DIAGNOSIS — N186 End stage renal disease: Secondary | ICD-10-CM | POA: Diagnosis not present

## 2020-12-29 DIAGNOSIS — D631 Anemia in chronic kidney disease: Secondary | ICD-10-CM | POA: Diagnosis not present

## 2020-12-29 DIAGNOSIS — N2581 Secondary hyperparathyroidism of renal origin: Secondary | ICD-10-CM | POA: Diagnosis not present

## 2020-12-30 ENCOUNTER — Other Ambulatory Visit: Payer: Self-pay

## 2020-12-30 ENCOUNTER — Other Ambulatory Visit: Payer: Self-pay | Admitting: Family Medicine

## 2020-12-30 DIAGNOSIS — I10 Essential (primary) hypertension: Secondary | ICD-10-CM

## 2020-12-30 MED ORDER — AMLODIPINE BESYLATE 10 MG PO TABS: 10.0000 mg | ORAL_TABLET | Freq: Every day | ORAL | 0 refills | Status: DC

## 2021-01-01 ENCOUNTER — Other Ambulatory Visit: Payer: Self-pay | Admitting: Family Medicine

## 2021-01-01 DIAGNOSIS — I10 Essential (primary) hypertension: Secondary | ICD-10-CM

## 2021-01-05 DIAGNOSIS — N186 End stage renal disease: Secondary | ICD-10-CM | POA: Diagnosis not present

## 2021-01-05 DIAGNOSIS — D631 Anemia in chronic kidney disease: Secondary | ICD-10-CM | POA: Diagnosis not present

## 2021-01-05 DIAGNOSIS — N2581 Secondary hyperparathyroidism of renal origin: Secondary | ICD-10-CM | POA: Diagnosis not present

## 2021-01-05 DIAGNOSIS — D509 Iron deficiency anemia, unspecified: Secondary | ICD-10-CM | POA: Diagnosis not present

## 2021-01-09 DIAGNOSIS — N2581 Secondary hyperparathyroidism of renal origin: Secondary | ICD-10-CM | POA: Diagnosis not present

## 2021-01-09 DIAGNOSIS — D631 Anemia in chronic kidney disease: Secondary | ICD-10-CM | POA: Diagnosis not present

## 2021-01-09 DIAGNOSIS — D509 Iron deficiency anemia, unspecified: Secondary | ICD-10-CM | POA: Diagnosis not present

## 2021-01-09 DIAGNOSIS — N186 End stage renal disease: Secondary | ICD-10-CM | POA: Diagnosis not present

## 2021-01-14 DIAGNOSIS — D631 Anemia in chronic kidney disease: Secondary | ICD-10-CM | POA: Diagnosis not present

## 2021-01-14 DIAGNOSIS — N2581 Secondary hyperparathyroidism of renal origin: Secondary | ICD-10-CM | POA: Diagnosis not present

## 2021-01-14 DIAGNOSIS — N186 End stage renal disease: Secondary | ICD-10-CM | POA: Diagnosis not present

## 2021-01-14 DIAGNOSIS — D509 Iron deficiency anemia, unspecified: Secondary | ICD-10-CM | POA: Diagnosis not present

## 2021-01-16 DIAGNOSIS — D509 Iron deficiency anemia, unspecified: Secondary | ICD-10-CM | POA: Diagnosis not present

## 2021-01-16 DIAGNOSIS — D631 Anemia in chronic kidney disease: Secondary | ICD-10-CM | POA: Diagnosis not present

## 2021-01-16 DIAGNOSIS — N2581 Secondary hyperparathyroidism of renal origin: Secondary | ICD-10-CM | POA: Diagnosis not present

## 2021-01-16 DIAGNOSIS — N186 End stage renal disease: Secondary | ICD-10-CM | POA: Diagnosis not present

## 2021-01-22 DIAGNOSIS — Z992 Dependence on renal dialysis: Secondary | ICD-10-CM | POA: Diagnosis not present

## 2021-01-22 DIAGNOSIS — N186 End stage renal disease: Secondary | ICD-10-CM | POA: Diagnosis not present

## 2021-01-23 DIAGNOSIS — D631 Anemia in chronic kidney disease: Secondary | ICD-10-CM | POA: Diagnosis not present

## 2021-01-23 DIAGNOSIS — D509 Iron deficiency anemia, unspecified: Secondary | ICD-10-CM | POA: Diagnosis not present

## 2021-01-23 DIAGNOSIS — E785 Hyperlipidemia, unspecified: Secondary | ICD-10-CM | POA: Diagnosis not present

## 2021-01-23 DIAGNOSIS — N2581 Secondary hyperparathyroidism of renal origin: Secondary | ICD-10-CM | POA: Diagnosis not present

## 2021-01-23 DIAGNOSIS — I10 Essential (primary) hypertension: Secondary | ICD-10-CM | POA: Diagnosis not present

## 2021-01-23 DIAGNOSIS — N186 End stage renal disease: Secondary | ICD-10-CM | POA: Diagnosis not present

## 2021-01-30 DIAGNOSIS — D509 Iron deficiency anemia, unspecified: Secondary | ICD-10-CM | POA: Diagnosis not present

## 2021-01-30 DIAGNOSIS — N186 End stage renal disease: Secondary | ICD-10-CM | POA: Diagnosis not present

## 2021-01-30 DIAGNOSIS — N2581 Secondary hyperparathyroidism of renal origin: Secondary | ICD-10-CM | POA: Diagnosis not present

## 2021-01-30 DIAGNOSIS — E785 Hyperlipidemia, unspecified: Secondary | ICD-10-CM | POA: Diagnosis not present

## 2021-01-30 DIAGNOSIS — I10 Essential (primary) hypertension: Secondary | ICD-10-CM | POA: Diagnosis not present

## 2021-01-30 DIAGNOSIS — D631 Anemia in chronic kidney disease: Secondary | ICD-10-CM | POA: Diagnosis not present

## 2021-02-02 ENCOUNTER — Ambulatory Visit: Payer: Self-pay

## 2021-02-02 DIAGNOSIS — R531 Weakness: Secondary | ICD-10-CM | POA: Diagnosis not present

## 2021-02-02 DIAGNOSIS — R42 Dizziness and giddiness: Secondary | ICD-10-CM | POA: Diagnosis not present

## 2021-02-02 DIAGNOSIS — I10 Essential (primary) hypertension: Secondary | ICD-10-CM | POA: Diagnosis not present

## 2021-02-02 DIAGNOSIS — G4489 Other headache syndrome: Secondary | ICD-10-CM | POA: Diagnosis not present

## 2021-02-02 NOTE — Telephone Encounter (Signed)
Pt called with BP of 238/84. Pt has since taken her BP medication.   Instructed PT to go to ED. PT agrees with POC.  Reason for Disposition . [id number] Systolic BP  >= 0000000 OR Diastolic >= [id number] AND [id number] cardiac or neurologic symptoms (e.g., chest pain, difficulty breathing, unsteady gait, blurred vision)  Answer Assessment - Initial Assessment Questions 1. BLOOD PRESSURE: "What is the blood pressure?" "Did you take at least two measurements 5 minutes apart?"     238/84 - No 2. ONSET: "When did you take your blood pressure?"     A few minutes ago 3. HOW: "How did you obtain the blood pressure?" (e.g., visiting nurse, automatic home BP monitor)     Home automatic 4. HISTORY: "Do you have a history of high blood pressure?"     yes 5. MEDICATIONS: "Are you taking any medications for blood pressure?" "Have you missed any doses recently?"     Took medication after gettingthis reading 6. OTHER SYMPTOMS: "Do you have any symptoms?" (e.g., headache, chest pain, blurred vision, difficulty breathing, weakness)     Headache 7. PREGNANCY: "Is there any chance you are pregnant?" "When was your last menstrual period?"     no  Protocols used: BLOOD PRESSURE - HIGH-A-AH

## 2021-02-03 ENCOUNTER — Other Ambulatory Visit: Payer: Self-pay | Admitting: Family Medicine

## 2021-02-03 DIAGNOSIS — E1122 Type 2 diabetes mellitus with diabetic chronic kidney disease: Secondary | ICD-10-CM

## 2021-02-03 DIAGNOSIS — N185 Chronic kidney disease, stage 5: Secondary | ICD-10-CM

## 2021-02-04 DIAGNOSIS — N186 End stage renal disease: Secondary | ICD-10-CM | POA: Diagnosis not present

## 2021-02-04 DIAGNOSIS — D631 Anemia in chronic kidney disease: Secondary | ICD-10-CM | POA: Diagnosis not present

## 2021-02-04 DIAGNOSIS — N2581 Secondary hyperparathyroidism of renal origin: Secondary | ICD-10-CM | POA: Diagnosis not present

## 2021-02-04 DIAGNOSIS — D509 Iron deficiency anemia, unspecified: Secondary | ICD-10-CM | POA: Diagnosis not present

## 2021-02-04 DIAGNOSIS — E785 Hyperlipidemia, unspecified: Secondary | ICD-10-CM | POA: Diagnosis not present

## 2021-02-04 DIAGNOSIS — I10 Essential (primary) hypertension: Secondary | ICD-10-CM | POA: Diagnosis not present

## 2021-02-09 ENCOUNTER — Other Ambulatory Visit: Payer: Self-pay | Admitting: Family Medicine

## 2021-02-09 DIAGNOSIS — I10 Essential (primary) hypertension: Secondary | ICD-10-CM

## 2021-02-10 ENCOUNTER — Other Ambulatory Visit: Payer: Self-pay | Admitting: Family Medicine

## 2021-02-10 DIAGNOSIS — I10 Essential (primary) hypertension: Secondary | ICD-10-CM

## 2021-02-13 DIAGNOSIS — D509 Iron deficiency anemia, unspecified: Secondary | ICD-10-CM | POA: Diagnosis not present

## 2021-02-13 DIAGNOSIS — E785 Hyperlipidemia, unspecified: Secondary | ICD-10-CM | POA: Diagnosis not present

## 2021-02-13 DIAGNOSIS — N2581 Secondary hyperparathyroidism of renal origin: Secondary | ICD-10-CM | POA: Diagnosis not present

## 2021-02-13 DIAGNOSIS — D631 Anemia in chronic kidney disease: Secondary | ICD-10-CM | POA: Diagnosis not present

## 2021-02-13 DIAGNOSIS — I10 Essential (primary) hypertension: Secondary | ICD-10-CM | POA: Diagnosis not present

## 2021-02-13 DIAGNOSIS — N186 End stage renal disease: Secondary | ICD-10-CM | POA: Diagnosis not present

## 2021-02-20 DIAGNOSIS — N2581 Secondary hyperparathyroidism of renal origin: Secondary | ICD-10-CM | POA: Diagnosis not present

## 2021-02-20 DIAGNOSIS — E785 Hyperlipidemia, unspecified: Secondary | ICD-10-CM | POA: Diagnosis not present

## 2021-02-20 DIAGNOSIS — D631 Anemia in chronic kidney disease: Secondary | ICD-10-CM | POA: Diagnosis not present

## 2021-02-20 DIAGNOSIS — N186 End stage renal disease: Secondary | ICD-10-CM | POA: Diagnosis not present

## 2021-02-20 DIAGNOSIS — I10 Essential (primary) hypertension: Secondary | ICD-10-CM | POA: Diagnosis not present

## 2021-02-20 DIAGNOSIS — D509 Iron deficiency anemia, unspecified: Secondary | ICD-10-CM | POA: Diagnosis not present

## 2021-02-21 DIAGNOSIS — N186 End stage renal disease: Secondary | ICD-10-CM | POA: Diagnosis not present

## 2021-02-21 DIAGNOSIS — Z992 Dependence on renal dialysis: Secondary | ICD-10-CM | POA: Diagnosis not present

## 2021-02-25 DIAGNOSIS — N186 End stage renal disease: Secondary | ICD-10-CM | POA: Diagnosis not present

## 2021-02-25 DIAGNOSIS — E11621 Type 2 diabetes mellitus with foot ulcer: Secondary | ICD-10-CM | POA: Diagnosis not present

## 2021-02-25 DIAGNOSIS — D509 Iron deficiency anemia, unspecified: Secondary | ICD-10-CM | POA: Diagnosis not present

## 2021-02-25 DIAGNOSIS — D631 Anemia in chronic kidney disease: Secondary | ICD-10-CM | POA: Diagnosis not present

## 2021-02-25 DIAGNOSIS — N2581 Secondary hyperparathyroidism of renal origin: Secondary | ICD-10-CM | POA: Diagnosis not present

## 2021-03-02 DIAGNOSIS — D631 Anemia in chronic kidney disease: Secondary | ICD-10-CM | POA: Diagnosis not present

## 2021-03-02 DIAGNOSIS — N186 End stage renal disease: Secondary | ICD-10-CM | POA: Diagnosis not present

## 2021-03-02 DIAGNOSIS — D509 Iron deficiency anemia, unspecified: Secondary | ICD-10-CM | POA: Diagnosis not present

## 2021-03-02 DIAGNOSIS — N2581 Secondary hyperparathyroidism of renal origin: Secondary | ICD-10-CM | POA: Diagnosis not present

## 2021-03-13 DIAGNOSIS — N186 End stage renal disease: Secondary | ICD-10-CM | POA: Diagnosis not present

## 2021-03-13 DIAGNOSIS — N2581 Secondary hyperparathyroidism of renal origin: Secondary | ICD-10-CM | POA: Diagnosis not present

## 2021-03-13 DIAGNOSIS — D631 Anemia in chronic kidney disease: Secondary | ICD-10-CM | POA: Diagnosis not present

## 2021-03-13 DIAGNOSIS — D509 Iron deficiency anemia, unspecified: Secondary | ICD-10-CM | POA: Diagnosis not present

## 2021-03-24 DIAGNOSIS — Z992 Dependence on renal dialysis: Secondary | ICD-10-CM | POA: Diagnosis not present

## 2021-03-24 DIAGNOSIS — N186 End stage renal disease: Secondary | ICD-10-CM | POA: Diagnosis not present

## 2021-03-25 DIAGNOSIS — E11621 Type 2 diabetes mellitus with foot ulcer: Secondary | ICD-10-CM | POA: Diagnosis not present

## 2021-03-25 DIAGNOSIS — N186 End stage renal disease: Secondary | ICD-10-CM | POA: Diagnosis not present

## 2021-03-25 DIAGNOSIS — N2581 Secondary hyperparathyroidism of renal origin: Secondary | ICD-10-CM | POA: Diagnosis not present

## 2021-03-25 DIAGNOSIS — D631 Anemia in chronic kidney disease: Secondary | ICD-10-CM | POA: Diagnosis not present

## 2021-03-30 DIAGNOSIS — D631 Anemia in chronic kidney disease: Secondary | ICD-10-CM | POA: Diagnosis not present

## 2021-03-30 DIAGNOSIS — N186 End stage renal disease: Secondary | ICD-10-CM | POA: Diagnosis not present

## 2021-03-30 DIAGNOSIS — N2581 Secondary hyperparathyroidism of renal origin: Secondary | ICD-10-CM | POA: Diagnosis not present

## 2021-04-08 DIAGNOSIS — D631 Anemia in chronic kidney disease: Secondary | ICD-10-CM | POA: Diagnosis not present

## 2021-04-08 DIAGNOSIS — N2581 Secondary hyperparathyroidism of renal origin: Secondary | ICD-10-CM | POA: Diagnosis not present

## 2021-04-08 DIAGNOSIS — N186 End stage renal disease: Secondary | ICD-10-CM | POA: Diagnosis not present

## 2021-04-22 DIAGNOSIS — D631 Anemia in chronic kidney disease: Secondary | ICD-10-CM | POA: Diagnosis not present

## 2021-04-22 DIAGNOSIS — N2581 Secondary hyperparathyroidism of renal origin: Secondary | ICD-10-CM | POA: Diagnosis not present

## 2021-04-22 DIAGNOSIS — N186 End stage renal disease: Secondary | ICD-10-CM | POA: Diagnosis not present

## 2021-04-23 DIAGNOSIS — N186 End stage renal disease: Secondary | ICD-10-CM | POA: Diagnosis not present

## 2021-04-23 DIAGNOSIS — Z992 Dependence on renal dialysis: Secondary | ICD-10-CM | POA: Diagnosis not present

## 2021-04-28 ENCOUNTER — Other Ambulatory Visit: Payer: Self-pay

## 2021-04-28 ENCOUNTER — Ambulatory Visit (INDEPENDENT_AMBULATORY_CARE_PROVIDER_SITE_OTHER): Payer: Medicare Other | Admitting: Podiatry

## 2021-04-28 DIAGNOSIS — M79674 Pain in right toe(s): Secondary | ICD-10-CM | POA: Diagnosis not present

## 2021-04-28 DIAGNOSIS — B351 Tinea unguium: Secondary | ICD-10-CM | POA: Diagnosis not present

## 2021-04-28 DIAGNOSIS — M79675 Pain in left toe(s): Secondary | ICD-10-CM | POA: Diagnosis not present

## 2021-04-28 DIAGNOSIS — L84 Corns and callosities: Secondary | ICD-10-CM | POA: Diagnosis not present

## 2021-04-28 DIAGNOSIS — E1142 Type 2 diabetes mellitus with diabetic polyneuropathy: Secondary | ICD-10-CM | POA: Diagnosis not present

## 2021-04-29 DIAGNOSIS — D631 Anemia in chronic kidney disease: Secondary | ICD-10-CM | POA: Diagnosis not present

## 2021-04-29 DIAGNOSIS — E785 Hyperlipidemia, unspecified: Secondary | ICD-10-CM | POA: Diagnosis not present

## 2021-04-29 DIAGNOSIS — E11621 Type 2 diabetes mellitus with foot ulcer: Secondary | ICD-10-CM | POA: Diagnosis not present

## 2021-04-29 DIAGNOSIS — E1122 Type 2 diabetes mellitus with diabetic chronic kidney disease: Secondary | ICD-10-CM | POA: Diagnosis not present

## 2021-04-29 DIAGNOSIS — N2581 Secondary hyperparathyroidism of renal origin: Secondary | ICD-10-CM | POA: Diagnosis not present

## 2021-04-29 DIAGNOSIS — D509 Iron deficiency anemia, unspecified: Secondary | ICD-10-CM | POA: Diagnosis not present

## 2021-04-29 DIAGNOSIS — N186 End stage renal disease: Secondary | ICD-10-CM | POA: Diagnosis not present

## 2021-05-02 ENCOUNTER — Encounter: Payer: Self-pay | Admitting: Podiatry

## 2021-05-02 NOTE — Progress Notes (Signed)
Subjective: Tammy Moses is a pleasant 55 y.o. female patient seen today for at risk diabetic foot care. She has h/o diabetic neuropathy and ESRD requiring hemodialysis. She is seen for painful thick toenails that are difficult to trim. Pain interferes with ambulation. Aggravating factors include wearing enclosed shoe gear. Pain is relieved with periodic professional debridement.  Dialysis days are MWF managed by Dr. Lyman Bishop. [place].  Patient states her blood glucose was 120 mg/dl today.  PCP is [name], FNP (Inactive). Last visit was: 02/03/2021.  She voices no new pedal problems on today's visit.  Allergies  Allergen Reactions   Acyclovir And Related Itching    Objective: Physical Exam  General: Tammy Moses is a pleasant 55 y.o. African American female, WD, WN in NAD. AAO x 3.   Vascular:  Capillary fill time to digits <3 seconds b/l lower extremities. Faintly palpable DP pulse(s) b/l lower extremities. Nonpalpable PT pulse(s) b/l lower extremities. Pedal hair sparse. Lower extremity skin temperature gradient within normal limits. No pain with calf compression b/l. No edema noted b/l lower extremities.  Dermatological:  Pedal skin with normal turgor, texture and tone b/l lower extremities No open wounds b/l lower extremities No interdigital macerations b/l lower extremities Toenails 1-5 b/l elongated, discolored, dystrophic, thickened, crumbly with subungual debris and tenderness to dorsal palpation. Hyperkeratotic lesion(s) plantarcentral arch left foot.  No erythema, no edema, no drainage, no fluctuance.  Musculoskeletal:  Normal muscle strength 5/5 to all lower extremity muscle groups bilaterally. No pain crepitus or joint limitation noted with ROM b/l. No gross bony deformities bilaterally.  Neurological:  Pt has subjective symptoms of neuropathy. Protective sensation diminished with 10g monofilament b/l.  Assessment and Plan:  1. Pain due to onychomycosis of  toenails of both feet   2. Callus   3. Diabetic peripheral neuropathy associated with type 2 diabetes mellitus ([place])    -Examined patient. -Continue diabetic foot care principles. -Patient to continue soft, supportive shoe gear daily. -Toenails 1-5 b/l were debrided in length and girth with sterile nail nippers and dremel without iatrogenic bleeding.  -Callus(es) plantarcentral arch left foot pared utilizing sterile scalpel blade without complication or incident. Total number debrided =1. -Patient to report any pedal injuries to medical professional immediately. -Patient/POA to call should there be question/concern in the interim.  Return in about 3 months (around 07/29/2021).  Marzetta Board, DPM

## 2021-05-13 ENCOUNTER — Ambulatory Visit (HOSPITAL_COMMUNITY)
Admission: EM | Admit: 2021-05-13 | Discharge: 2021-05-13 | Disposition: A | Discharge status: Home / Self Care | Payer: Medicare Other

## 2021-05-13 ENCOUNTER — Other Ambulatory Visit: Payer: Self-pay

## 2021-05-13 ENCOUNTER — Encounter (HOSPITAL_COMMUNITY): Payer: Self-pay | Admitting: Emergency Medicine

## 2021-05-13 DIAGNOSIS — I10 Essential (primary) hypertension: Secondary | ICD-10-CM

## 2021-05-13 DIAGNOSIS — N186 End stage renal disease: Secondary | ICD-10-CM | POA: Diagnosis not present

## 2021-05-13 DIAGNOSIS — R1084 Generalized abdominal pain: Secondary | ICD-10-CM | POA: Diagnosis not present

## 2021-05-13 DIAGNOSIS — R197 Diarrhea, unspecified: Secondary | ICD-10-CM | POA: Diagnosis not present

## 2021-05-13 DIAGNOSIS — Z992 Dependence on renal dialysis: Secondary | ICD-10-CM | POA: Diagnosis not present

## 2021-05-13 NOTE — Discharge Instructions (Addendum)
Go home and take your blood pressure medication.  Monitor blood pressure and if this remains elevated or if you develop any symptoms including chest pain, shortness of breath, vision changes, headache, dizziness you need to go to the emergency room.  Make sure you are drinking plenty of fluid and eat a bland diet.  Please bring back the stool to test for intestinal infections.  We will contact you if we need to arrange any treatment.  Follow-up with GI specialist as we discussed.  If you have any severe symptoms please go to the emergency room for further evaluation including imaging.

## 2021-05-13 NOTE — ED Provider Notes (Signed)
[place]    CSN: [id number] Arrival date & time: 05/13/21  [place]      History   Chief Complaint Chief Complaint  Patient presents with   Diarrhea    HPI Tammy Moses is a 55 y.o. female.   Patient presents today with a 3-day history of severe diarrhea.  She reports 10-15 watery bowel movements per day without blood or mucus.  She reports some abdominal pain prior to needing to use a bowel movement but denies ongoing abdominal pain.  She denies fever, nausea, vomiting, melena, hematochezia.  She does have a history of H. pylori and states current symptoms are similar to previous episodes of this condition.  Reports completing treatment approximately 1 year ago and has not seen a GI specialist since then.  She denies any recent antibiotic use, medication changes, recent travel, suspicious food intake, known sick contacts.  She denies history of diverticulitis or other gastrointestinal disorder.  She is concerned she might have eaten something expired as she has difficulty with her vision and is always read food labels accurately.  She has tried over-the-counter Imodium without improvement of symptoms.   Past Medical History:  Diagnosis Date   Anemia    Anxiety    Blind right eye    Constipation, chronic    Dental caries    Diabetes mellitus    Diabetic neuropathy ([place])    Diabetic retinopathy    GERD (gastroesophageal reflux disease)    Glaucoma    H. pylori infection    Hepatitis C carrier ([place])    High risk sexual behavior    Hyperlipidemia    Hypertension    Hypoglycemia 07/12/2017   Insomnia    Microalbuminuria    Nonspecific elevation of levels of transaminase or lactic acid dehydrogenase (LDH)    Tobacco dependence    Vitamin D deficiency     Patient Active Problem List   Diagnosis Date Noted   Impaired functional mobility and activity tolerance 01/04/2019   Acute renal failure superimposed on stage 5 chronic kidney disease, not on chronic dialysis  ([place]) 10/05/2017   Pneumonia of both lungs due to infectious organism 10/05/2017   Fall    Hypoglycemia 07/12/2017   CKD (chronic kidney disease) stage 5, GFR less than 15 ml/min (HCC) 07/12/2017   Anemia 57/32/2025   Alcoholic intoxication without complication (HCC)    COPD (chronic obstructive pulmonary disease) ([place]) 06/24/2017   ESRD (end stage renal disease) on dialysis ([place]) 06/17/2017   Foot drop 05/26/2017   Carpal tunnel syndrome of left wrist 05/20/2017   Ulnar neuropathy of left upper extremity 05/20/2017   Poor compliance with medication 05/17/2017   Peripheral neuropathy 05/10/2017   Diabetes mellitus ([place]) 05/10/2017   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   Vitamin D deficiency 04/07/2017   Absolute glaucoma of right eye 12/27/2016   Chronic angle-closure glaucoma of left eye, severe stage 12/27/2016   PCO (posterior capsule opacification), left 12/27/2016   Gait abnormality 03/07/2014   Abnormality of gait 03/07/2014   Hepatitis C virus infection without hepatic coma 03/04/2014   History of hepatitis C 03/04/2014   Erosive gastritis 02/14/2014   Pseudophakia of left eye 10/30/2012   Asthma 10/13/2012   Reflux 10/13/2012   Lens replaced by other means 10/12/2012   Primary open angle glaucoma 10/12/2012   Encounter for screening for diabetes mellitus 10/09/2012   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      Home  Medications    Prior to Admission medications   Medication Sig Start Date End Date Taking? Authorizing Provider  albuterol (VENTOLIN HFA) 108 (90 Base) MCG/ACT inhaler Inhale 2 puffs into the lungs every 4 (four) hours as needed for shortness of breath. For shortness of breath 12/11/19   [name], FNP  amLODipine (NORVASC) 10 MG tablet TAKE 1 TABLET(10 MG) BY MOUTH DAILY 02/10/21   [name], FNP  atropine 1 % ophthalmic solution Place 1 drop into both eyes 2 (two) times daily.    [provider]  blood glucose meter kit and supplies KIT Dispense based on patient and insurance preference. Use up to four times daily as directed. (FOR ICD-10 E.11.9) 11/30/17   Scot Jun, FNP  buPROPion [facility] SR) 150 MG 12 hr tablet Take 1 tablet (150 mg total) by mouth 2 (two) times daily. 12/11/19   [name], FNP  buPROPion (ZYBAN) 150 MG 12 hr tablet Take 150 mg by mouth every morning. 01/16/21   [provider]  calcium acetate (PHOSLO) 667 MG capsule Take 2,001 mg by mouth 3 (three) times daily with meals. 07/06/18   [provider]  calcium acetate (PHOSLO) 667 MG tablet Take by mouth. 11/22/20   [provider]  Cholecalciferol (VITAMIN D-3) 1000 units CAPS Take 2 capsules (2,000 Units total) by mouth daily. 12/13/17   Scot Jun, FNP  dorzolamide-timolol (COSOPT) 22.3-6.8 MG/ML ophthalmic solution 1 drop 2 (two) times daily. 12/08/20   [provider]  furosemide (LASIX) 40 MG tablet Take 1 tablet (40 mg total) by mouth daily. 12/11/19   [name], FNP  gabapentin (NEURONTIN) 600 MG tablet Take 1 tablet (600 mg total) by mouth 2 (two) times daily. 12/11/19   [name], FNP  glucose blood (ONE TOUCH ULTRA TEST) test strip USE TO TEST 3 TIMES A DAY 08/15/20   [name], FNP  hydrALAZINE (APRESOLINE) 100 MG tablet Take 100 mg by mouth 2 (two) times daily. 05/19/20   [provider]  hydrALAZINE  (APRESOLINE) 50 MG tablet Take 1 tablet (50 mg total) by mouth in the morning and at bedtime. 12/11/19   [name], FNP  Insulin Pen Needle 29G X 12MM MISC E11.9 Dx Code Use once at bedtime with each nightly insulin 03/11/17   Scot Jun, FNP  ketotifen (ZADITOR) 0.025 % ophthalmic solution Place 1 drop into both eyes 2 (two) times daily. 03/30/16   Janne Napoleon, NP  lamoTRIgine (LAMICTAL) 25 MG tablet Take by mouth. 01/16/21   [provider]  Lancets [facility] ULTRASOFT) lancets Use as instructed 07/10/19   [name], FNP  LANTUS SOLOSTAR 100 UNIT/ML Solostar Pen ADMINISTER 20 UNITS UNDER THE SKIN DAILY AT 10 PM 02/04/21   [name], FNP  latanoprost (XALATAN) 0.005 % ophthalmic solution SMARTSIG:1 Drop(s) In Eye(s) Every Evening 12/16/20   [provider]  omeprazole (PRILOSEC) 20 MG capsule Take  1 capsule (20 mg total) by mouth daily. Has to be taken at the same time with Harvoni FASTED 12/11/19   [name], FNP  oxyCODONE (ROXICODONE) 5 MG immediate release tablet Take 0.5 tablets (2.5 mg total) by mouth every 4 (four) hours as needed for severe pain. 03/17/20   Deno Etienne, DO  pravastatin (PRAVACHOL) 80 MG tablet Take 1 tablet (80 mg total) by mouth daily. 12/11/19   [name], FNP  predniSONE (DELTASONE) 20 MG tablet 2 tabs po daily x 4 days 03/17/20   Deno Etienne, DO  timolol (TIMOPTIC) 0.5 % ophthalmic solution Place 1 drop into the left eye at bedtime.  05/23/17   [provider]  Travoprost, BAK Free, (TRAVATAN) 0.004 % SOLN ophthalmic solution Place 1 drop into both eyes at bedtime.    [provider]  traZODone (DESYREL) 150 MG tablet Take 1 tablet (150 mg total) by mouth at bedtime. 12/11/19   [name], FNP    Family History Family History  Problem Relation Age of Onset   High blood pressure Mother    Diabetes Mother    Thyroid disease Mother    Diabetes Father    High blood pressure Father     Social  History Social History   Tobacco Use   Smoking status: Every Day    Packs/day: 0.25    Types: Cigarettes   Smokeless tobacco: Never   Tobacco comments:    trying to quit  Vaping Use   Vaping Use: Never used  Substance Use Topics   Alcohol use: Yes    Comment: 2-3 beers weekly   Drug use: Not Currently    Types: Marijuana, Cocaine    Comment: quit 3 months ago     Allergies   Acyclovir and related   Review of Systems Review of Systems  Constitutional:  Negative for activity change, appetite change and fatigue.  Eyes:  Negative for visual disturbance.  Respiratory:  Negative for cough and shortness of breath.   Cardiovascular:  Negative for chest pain.  Gastrointestinal:  Positive for abdominal pain and diarrhea. Negative for nausea and vomiting.  Musculoskeletal:  Negative for arthralgias and myalgias.  Neurological:  Negative for dizziness, light-headedness, numbness and headaches.    Physical Exam Triage Vital Signs ED Triage Vitals  Enc Vitals Group     BP 05/13/21 1834 (!) 205/90     Pulse Rate 05/13/21 1834 91     Resp 05/13/21 1834 17     Temp 05/13/21 1834 97.6 F (36.4 C)     Temp Source 05/13/21 1834 Oral     SpO2 05/13/21 1834 97 %     Weight --      Height --      Head Circumference --      Peak Flow --      Pain Score 05/13/21 1835 7     Pain Loc --      Pain Edu? --      Excl. in [place]? --    No data found.  Updated Vital Signs BP (!) 205/90 (BP Location: Right Arm)   Pulse 91   Temp 97.6 F (36.4 C) (Oral)   Resp 17   SpO2 97%   Visual Acuity Right Eye Distance:   Left Eye Distance:   Bilateral Distance:    Right Eye Near:   Left Eye Near:    Bilateral Near:     Physical Exam Vitals reviewed.  Constitutional:  General: She is awake. She is not in acute distress.    Appearance: Normal appearance. She is normal weight. She is not ill-appearing.     Comments: Very pleasant female appears stated age sitting comfortably in exam  room in no acute distress  HENT:     Head: Normocephalic and atraumatic.  Eyes:     Comments: Cataract noted right eye  Cardiovascular:     Rate and Rhythm: Normal rate and regular rhythm.     Heart sounds: Normal heart sounds, S1 normal and S2 normal. No murmur heard. Pulmonary:     Effort: Pulmonary effort is normal.     Breath sounds: Normal breath sounds. No wheezing, rhonchi or rales.     Comments: Clear to auscultation bilaterally Abdominal:     General: Bowel sounds are normal.     Palpations: Abdomen is soft.     Tenderness: There is no abdominal tenderness. There is no right CVA tenderness, left CVA tenderness, guarding or rebound.     Comments: Benign abdominal exam; nontender to palpation.  Psychiatric:        Behavior: Behavior is cooperative.     UC Treatments / Results  Labs (all labs ordered are listed, but only abnormal results are displayed) Labs Reviewed  C DIFFICILE QUICK SCREEN W PCR REFLEX      EKG   Radiology No results found.  Procedures Procedures (including critical care time)  Medications Ordered in UC Medications - No data to display  Initial Impression / Assessment and Plan / UC Course  I have reviewed the triage vital signs and the nursing notes.  Pertinent labs & imaging results that were available during my care of the patient were reviewed by me and considered in my medical decision making (see chart for details).     Vital signs and physical exam reassuring today; no indication for emergent evaluation or imaging.  Given severity of diarrhea will obtain GI stool panel including C. difficile.  Patient did not believe she can give [place] a specimen here so we will take his home and return this tomorrow.  Discussed that she should not take antimotility agents until we rule out infectious etiology.  Encouraged her to eat a bland diet and drink plenty of fluid.  Discussed alarm symptoms that warrant emergent evaluation to which patient expressed  understanding.  She was given contact information for local GI specialist encouraged to follow-up with them should symptoms persist.  We are unable to get imaging and so we discussed that if symptoms persist or she develops any persistent abdominal pain she needs to go to the emergency room for further evaluation and to consider abdominal CT scan.  Strict return precautions given to which patient expressed understanding.  Patient will pressure was noted to be very elevated today.  She denies any signs/symptoms of endorgan damage including headache, chest pain, shortness of breath, vision changes.  She has a history of end-stage renal disease with associated elevated blood pressure and has not taken her postdialysis blood pressure medications prior to visit today.  She intends to go home and take these medications and will monitor her blood pressure at home.  She has follow-up scheduled with nephrology and primary care and intends to keep these appointments.  Discussed alarm symptoms that warrant emergent evaluation to which she expressed understanding.   Final Clinical Impressions(s) / UC Diagnoses   Final diagnoses:  Diarrhea, unspecified type  Generalized abdominal pain  Elevated blood pressure reading in office with diagnosis  of hypertension  ESRD on dialysis [facility])     Discharge Instructions      Go home and take your blood pressure medication.  Monitor blood pressure and if this remains elevated or if you develop any symptoms including chest pain, shortness of breath, vision changes, headache, dizziness you need to go to the emergency room.  Make sure you are drinking plenty of fluid and eat a bland diet.  Please bring back the stool to test for intestinal infections.  We will contact you if we need to arrange any treatment.  Follow-up with GI specialist as we discussed.  If you have any severe symptoms please go to the emergency room for further evaluation including imaging.     ED  Prescriptions   None    PDMP not reviewed this encounter.   Terrilee Croak, PA-C 05/13/21 1927

## 2021-05-13 NOTE — ED Triage Notes (Addendum)
Patient c/o diarrhea x 3 days.   Patient endorses changing 10-15 diapers a day.   Patient endorses a history of similar symptoms due to H. Pylori.   Patient has history of hemorrhoids per patient statement.   Patient endorses intermittent lower ABD pain.   Patient has used an OTC "red pill" with minimal relief of symptoms.   Patient denies any recent change in medication regimen.

## 2021-05-14 ENCOUNTER — Encounter: Payer: Self-pay | Admitting: Internal Medicine

## 2021-05-15 ENCOUNTER — Emergency Department (HOSPITAL_BASED_OUTPATIENT_CLINIC_OR_DEPARTMENT_OTHER): Payer: Medicare Other

## 2021-05-15 ENCOUNTER — Emergency Department (HOSPITAL_BASED_OUTPATIENT_CLINIC_OR_DEPARTMENT_OTHER)
Admission: EM | Admit: 2021-05-15 | Discharge: 2021-05-15 | Disposition: A | Discharge status: Home / Self Care | Payer: Medicare Other | Attending: Emergency Medicine | Admitting: Emergency Medicine

## 2021-05-15 ENCOUNTER — Encounter (HOSPITAL_BASED_OUTPATIENT_CLINIC_OR_DEPARTMENT_OTHER): Payer: Self-pay

## 2021-05-15 DIAGNOSIS — E1122 Type 2 diabetes mellitus with diabetic chronic kidney disease: Secondary | ICD-10-CM | POA: Insufficient documentation

## 2021-05-15 DIAGNOSIS — I120 Hypertensive chronic kidney disease with stage 5 chronic kidney disease or end stage renal disease: Secondary | ICD-10-CM | POA: Diagnosis not present

## 2021-05-15 DIAGNOSIS — E1140 Type 2 diabetes mellitus with diabetic neuropathy, unspecified: Secondary | ICD-10-CM | POA: Diagnosis not present

## 2021-05-15 DIAGNOSIS — I10 Essential (primary) hypertension: Secondary | ICD-10-CM | POA: Diagnosis not present

## 2021-05-15 DIAGNOSIS — Z79899 Other long term (current) drug therapy: Secondary | ICD-10-CM | POA: Diagnosis not present

## 2021-05-15 DIAGNOSIS — Z794 Long term (current) use of insulin: Secondary | ICD-10-CM | POA: Diagnosis not present

## 2021-05-15 DIAGNOSIS — K529 Noninfective gastroenteritis and colitis, unspecified: Secondary | ICD-10-CM | POA: Diagnosis not present

## 2021-05-15 DIAGNOSIS — F17210 Nicotine dependence, cigarettes, uncomplicated: Secondary | ICD-10-CM | POA: Insufficient documentation

## 2021-05-15 DIAGNOSIS — Z992 Dependence on renal dialysis: Secondary | ICD-10-CM | POA: Diagnosis not present

## 2021-05-15 DIAGNOSIS — J449 Chronic obstructive pulmonary disease, unspecified: Secondary | ICD-10-CM | POA: Insufficient documentation

## 2021-05-15 DIAGNOSIS — N186 End stage renal disease: Secondary | ICD-10-CM

## 2021-05-15 DIAGNOSIS — N185 Chronic kidney disease, stage 5: Secondary | ICD-10-CM | POA: Diagnosis not present

## 2021-05-15 DIAGNOSIS — R109 Unspecified abdominal pain: Secondary | ICD-10-CM | POA: Diagnosis not present

## 2021-05-15 DIAGNOSIS — R197 Diarrhea, unspecified: Secondary | ICD-10-CM | POA: Diagnosis not present

## 2021-05-15 LAB — LIPASE, BLOOD: Lipase: 207 U/L — ABNORMAL HIGH (ref 11–51)

## 2021-05-15 LAB — CBC WITH DIFFERENTIAL/PLATELET
Abs Immature Granulocytes: 0.02 10*3/uL (ref 0.00–0.07)
Basophils Absolute: 0.1 10*3/uL (ref 0.0–0.1)
Basophils Relative: 1 %
Eosinophils Absolute: 0.2 10*3/uL (ref 0.0–0.5)
Eosinophils Relative: 2 %
HCT: 34.1 % — ABNORMAL LOW (ref 36.0–46.0)
Hemoglobin: 10.9 g/dL — ABNORMAL LOW (ref 12.0–15.0)
Immature Granulocytes: 0 %
Lymphocytes Relative: 25 %
Lymphs Abs: 2 10*3/uL (ref 0.7–4.0)
MCH: 30 pg (ref 26.0–34.0)
MCHC: 32 g/dL (ref 30.0–36.0)
MCV: 93.9 fL (ref 80.0–100.0)
Monocytes Absolute: 0.8 10*3/uL (ref 0.1–1.0)
Monocytes Relative: 10 %
Neutro Abs: 4.9 10*3/uL (ref 1.7–7.7)
Neutrophils Relative %: 62 %
Platelets: 277 10*3/uL (ref 150–400)
RBC: 3.63 MIL/uL — ABNORMAL LOW (ref 3.87–5.11)
RDW: 15 % (ref 11.5–15.5)
WBC: 7.9 10*3/uL (ref 4.0–10.5)
nRBC: 0 % (ref 0.0–0.2)

## 2021-05-15 LAB — COMPREHENSIVE METABOLIC PANEL
ALT: 15 U/L (ref 0–44)
AST: 21 U/L (ref 15–41)
Albumin: 4.1 g/dL (ref 3.5–5.0)
Alkaline Phosphatase: 52 U/L (ref 38–126)
Anion gap: 18 — ABNORMAL HIGH (ref 5–15)
BUN: 80 mg/dL — ABNORMAL HIGH (ref 6–20)
CO2: 12 mmol/L — ABNORMAL LOW (ref 22–32)
Calcium: 9.1 mg/dL (ref 8.9–10.3)
Chloride: 106 mmol/L (ref 98–111)
Creatinine, Ser: 12.65 mg/dL — ABNORMAL HIGH (ref 0.44–1.00)
GFR, Estimated: 3 mL/min — ABNORMAL LOW (ref >60)
Glucose, Bld: 91 mg/dL (ref 70–99)
Potassium: 5.2 mmol/L — ABNORMAL HIGH (ref 3.5–5.1)
Sodium: 136 mmol/L (ref 135–145)
Total Bilirubin: 0.4 mg/dL (ref 0.3–1.2)
Total Protein: 7.7 g/dL (ref 6.5–8.1)

## 2021-05-15 MED ORDER — HYDRALAZINE HCL 25 MG PO TABS
50.0000 mg | ORAL_TABLET | Freq: Once | ORAL | Status: AC
  Administered 2021-05-15: 50 mg via ORAL
  Filled 2021-05-15: qty 2

## 2021-05-15 MED ORDER — IOHEXOL 350 MG/ML SOLN
75.0000 mL | Freq: Once | INTRAVENOUS | Status: AC | PRN
  Administered 2021-05-15: 75 mL via INTRAVENOUS

## 2021-05-15 MED ORDER — CEFDINIR 300 MG PO CAPS: ORAL_CAPSULE | ORAL | 0 refills | Status: DC

## 2021-05-15 MED ORDER — SODIUM CHLORIDE 0.9 % IV SOLN
1.0000 g | Freq: Once | INTRAVENOUS | Status: AC
  Administered 2021-05-15: 1 g via INTRAVENOUS
  Filled 2021-05-15: qty 10

## 2021-05-15 MED ORDER — ONDANSETRON HCL 4 MG/2ML IJ SOLN
4.0000 mg | Freq: Once | INTRAMUSCULAR | Status: AC
  Administered 2021-05-15: 4 mg via INTRAVENOUS
  Filled 2021-05-15: qty 2

## 2021-05-15 MED ORDER — METRONIDAZOLE 500 MG PO TABS: 500.0000 mg | ORAL_TABLET | Freq: Two times a day (BID) | ORAL | 0 refills | Status: DC

## 2021-05-15 MED ORDER — MORPHINE SULFATE (PF) 4 MG/ML IV SOLN
4.0000 mg | Freq: Once | INTRAVENOUS | Status: AC
Start: 2021-05-15 — End: 2021-05-15
  Administered 2021-05-15: 4 mg via INTRAVENOUS
  Filled 2021-05-15: qty 1

## 2021-05-15 MED ORDER — HYDROCODONE-ACETAMINOPHEN 5-325 MG PO TABS
1.0000 | ORAL_TABLET | Freq: Four times a day (QID) | ORAL | 0 refills | Status: DC | PRN
Start: 2021-05-15 — End: 2021-06-19

## 2021-05-15 NOTE — ED Notes (Signed)
Pt verbalizes understanding of discharge instructions. Opportunity for questioning and answers were provided. [place] removed by staff, pt discharged from ED to home. Educated to pick up Rx.  

## 2021-05-15 NOTE — Discharge Instructions (Addendum)
Take the antibiotics as prescribed.  You can take the hydrocodone for pain.  He can also take over-the-counter Imodium to help with her diarrhea.  Follow-up with your doctor to make sure the symptoms improved.  Take the antibiotics until you are finished.  Make sure to go to dialysis as planned

## 2021-05-15 NOTE — ED Triage Notes (Addendum)
"  Severe diarrhea and rectal pain x 1 week, diagnosed with possible diverticulitis and I am supposed to see a GI specialist in August" per pt  Patient states she does also have a hemorrhoid

## 2021-05-15 NOTE — ED Triage Notes (Signed)
BIB EMS due to abd pain with diarrhea, reports she has gone through 60 diapers in a week. Seen 7/20 and has made an appoint with GI but can not be seen until end of Sept. [id number] 97

## 2021-05-15 NOTE — ED Notes (Signed)
Taxi voucher approved by charge

## 2021-05-15 NOTE — ED Provider Notes (Signed)
[facility] EMERGENCY DEPT Provider Note   CSN: [id number] Arrival date & time: 05/15/21  1212     History Chief Complaint  Patient presents with   Hemorrhoids    Tammy Moses is a 55 y.o. female.  HPI  Patient presents to the ED for evaluation of abdominal pain and diarrhea.  Patient states she has been having symptoms for several days now.  She is having frequent loose watery stools.  She also started having rectal pain and discomfort.  Patient states she is gone through about 60 adult diapers in the past week.  Patient states her rectal area is burning and uncomfortable.  She is also having pain diffusely throughout her abdomen.  She was seen at an urgent care a few days ago.  They referred her to a GI doctor.  Patient states her symptoms got worse today so she came to the ED.  No fevers.  No vomiting.  Patient is a dialysis patient.  She was supposed to go today.  She already called the unit will go tomorrow.  She did not go because she came to the ED today.  Past Medical History:  Diagnosis Date   Anemia    Anxiety    Blind right eye    Constipation, chronic    Dental caries    Diabetes mellitus    Diabetic neuropathy ([place])    Diabetic retinopathy    GERD (gastroesophageal reflux disease)    Glaucoma    H. pylori infection    Hepatitis C carrier ([place])    High risk sexual behavior    Hyperlipidemia    Hypertension    Hypoglycemia 07/12/2017   Insomnia    Microalbuminuria    Nonspecific elevation of levels of transaminase or lactic acid dehydrogenase (LDH)    Tobacco dependence    Vitamin D deficiency     Patient Active Problem List   Diagnosis Date Noted   Impaired functional mobility and activity tolerance 01/04/2019   Acute renal failure superimposed on stage 5 chronic kidney disease, not on chronic dialysis ([place]) 10/05/2017   Pneumonia of both lungs due to infectious organism 10/05/2017   Fall    Hypoglycemia 07/12/2017   CKD (chronic kidney  disease) stage 5, GFR less than 15 ml/min (HCC) 07/12/2017   Anemia 98/33/8250   Alcoholic intoxication without complication (HCC)    COPD (chronic obstructive pulmonary disease) ([place]) 06/24/2017   ESRD (end stage renal disease) on dialysis ([place]) 06/17/2017   Foot drop 05/26/2017   Carpal tunnel syndrome of left wrist 05/20/2017   Ulnar neuropathy of left upper extremity 05/20/2017   Poor compliance with medication 05/17/2017   Peripheral neuropathy 05/10/2017   Diabetes mellitus ([place]) 05/10/2017   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   Vitamin D deficiency 04/07/2017   Absolute glaucoma of right eye 12/27/2016   Chronic angle-closure glaucoma of left eye, severe stage 12/27/2016   PCO (posterior capsule opacification), left 12/27/2016   Gait abnormality 03/07/2014   Abnormality of gait 03/07/2014   Hepatitis C virus infection without hepatic coma 03/04/2014   History of hepatitis C 03/04/2014   Erosive gastritis 02/14/2014   Pseudophakia of left eye 10/30/2012   Asthma 10/13/2012   Reflux 10/13/2012   Lens replaced by other means 10/12/2012   Primary open angle glaucoma 10/12/2012   Encounter for screening for diabetes mellitus 10/09/2012   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     Family History  Problem Relation Age of Onset   High blood pressure Mother    Diabetes Mother    Thyroid disease Mother    Diabetes Father     High blood pressure Father     Social History   Tobacco Use   Smoking status: Every Day    Packs/day: 0.25    Types: Cigarettes   Smokeless tobacco: Never   Tobacco comments:    trying to quit  Vaping Use   Vaping Use: Never used  Substance Use Topics   Alcohol use: Yes    Comment: 2-3 beers weekly   Drug use: Not Currently    Types: Marijuana, Cocaine    Comment: quit 3 months ago    Home Medications Prior to Admission medications   Medication Sig Start Date End Date Taking? Authorizing Provider  cefdinir (OMNICEF) 300 MG capsule Take 300 mg orally every other day after your dialysis treatment 05/15/21  Yes Dorie Rank, MD  HYDROcodone-acetaminophen (NORCO/VICODIN) 5-325 MG tablet Take 1 tablet by mouth every 6 (six) hours as needed. 05/15/21  Yes Dorie Rank, MD  metroNIDAZOLE (FLAGYL) 500 MG tablet Take 1 tablet (500 mg total) by mouth 2 (two) times daily. 05/15/21  Yes Dorie Rank, MD  albuterol (VENTOLIN HFA) 108 (90 Base) MCG/ACT inhaler Inhale 2 puffs into the lungs every 4 (four) hours as needed for shortness of breath. For shortness of breath 12/11/19   [name], FNP  amLODipine (NORVASC) 10 MG tablet TAKE 1 TABLET(10 MG) BY MOUTH DAILY 02/10/21   [name], FNP  atropine 1 % ophthalmic solution Place 1 drop into both eyes 2 (two) times daily.    [provider]  blood glucose meter kit and supplies KIT Dispense based on patient and insurance preference. Use up to four times daily as directed. (FOR ICD-10 E.11.9) 11/30/17   Scot Jun, FNP  buPROPion [facility] SR) 150 MG 12 hr tablet Take 1 tablet (150 mg total) by mouth 2 (two) times daily. 12/11/19   [name], FNP  buPROPion (ZYBAN) 150 MG 12 hr tablet Take 150 mg by mouth every morning. 01/16/21   [provider]  calcium acetate (PHOSLO) 667 MG capsule Take 2,001 mg by mouth 3 (three) times daily with meals. 07/06/18   [provider]  calcium acetate (PHOSLO) 667 MG  tablet Take by mouth. 11/22/20   [provider]  Cholecalciferol (VITAMIN D-3) 1000 units CAPS Take 2 capsules (2,000 Units total) by mouth daily. 12/13/17   Scot Jun, FNP  dorzolamide-timolol (COSOPT) 22.3-6.8 MG/ML ophthalmic solution 1 drop 2 (two) times daily. 12/08/20   [provider]  furosemide (LASIX) 40 MG tablet Take 1 tablet (40 mg total) by mouth daily. 12/11/19   [name], FNP  gabapentin (NEURONTIN) 600 MG tablet Take 1 tablet (600 mg total) by mouth 2 (two) times daily. 12/11/19   [name], FNP  glucose blood (ONE TOUCH ULTRA TEST) test strip USE  TO TEST 3 TIMES A DAY 08/15/20   [name], FNP  hydrALAZINE (APRESOLINE) 100 MG tablet Take 100 mg by mouth 2 (two) times daily. 05/19/20   [provider]  hydrALAZINE (APRESOLINE) 50 MG tablet Take 1 tablet (50 mg total) by mouth in the morning and at bedtime. 12/11/19   [name], FNP  Insulin Pen Needle 29G X 12MM MISC E11.9 Dx Code Use once at bedtime with each nightly insulin 03/11/17   Scot Jun, FNP  ketotifen (ZADITOR) 0.025 % ophthalmic solution Place 1 drop into both eyes 2 (two) times daily. 03/30/16   Janne Napoleon, NP  lamoTRIgine (LAMICTAL) 25 MG tablet Take by mouth. 01/16/21   [provider]  Lancets [facility] ULTRASOFT) lancets Use as instructed 07/10/19   [name], FNP  LANTUS SOLOSTAR 100 UNIT/ML Solostar Pen ADMINISTER 20 UNITS UNDER THE SKIN DAILY AT 10 PM 02/04/21   [name], FNP  latanoprost (XALATAN) 0.005 % ophthalmic solution SMARTSIG:1 Drop(s) In Eye(s) Every Evening 12/16/20   [provider]  omeprazole (PRILOSEC) 20 MG capsule Take 1 capsule (20 mg total) by mouth daily. Has to be taken at the same time with Harvoni FASTED 12/11/19   [name], FNP  oxyCODONE (ROXICODONE) 5 MG immediate release tablet Take 0.5 tablets (2.5 mg total) by mouth every 4 (four) hours as needed for severe pain. 03/17/20   Deno Etienne, DO  pravastatin (PRAVACHOL) 80 MG tablet Take 1 tablet (80 mg total) by mouth daily. 12/11/19   [name], FNP  predniSONE (DELTASONE) 20 MG tablet 2 tabs po daily x 4 days 03/17/20   Deno Etienne, DO  timolol (TIMOPTIC) 0.5 % ophthalmic solution Place 1 drop into the left eye at bedtime.  05/23/17   [provider]  Travoprost, BAK Free, (TRAVATAN) 0.004 % SOLN ophthalmic solution Place 1 drop into both eyes at bedtime.    [provider]  traZODone (DESYREL) 150 MG tablet Take 1 tablet (150 mg total) by mouth at bedtime. 12/11/19   [name], FNP    Allergies    Acyclovir and related  Review of Systems   Review of Systems  All other systems reviewed and are negative.  Physical Exam Updated Vital Signs BP (!) 187/115   Pulse 92   Temp 97.9 F (36.6 C)   Resp 17   Ht 1.549 m (_0 )   Wt 54.4 kg   SpO2 100%   BMI 22.67 kg/m   Physical Exam Vitals and nursing note reviewed.  Constitutional:      General: She is not in acute distress.    Appearance: She is well-developed.  HENT:     Head: Normocephalic and atraumatic.     Right Ear: External ear normal.     Left Ear: External ear normal.  Eyes:     General: No scleral icterus.       Right eye: No discharge.        Left eye: No discharge.     Conjunctiva/sclera: Conjunctivae normal.  Neck:     Trachea: No tracheal deviation.  Cardiovascular:     Rate and Rhythm: Normal rate and regular rhythm.  Pulmonary:     Effort: Pulmonary effort is normal. No respiratory distress.     Breath sounds: Normal breath sounds. No stridor. No wheezing or rales.  Abdominal:     General: Bowel sounds are normal. There is no distension.     Palpations:  Abdomen is soft.     Tenderness: There is abdominal tenderness. There is no guarding or rebound.     Comments: Generalized tenderness  Genitourinary:    Comments: Tenderness palpation on rectal exam, no mass appreciated, liquidy tan stool on exam, no  melena or blood Musculoskeletal:        General: No tenderness or deformity.     Cervical back: Neck supple.  Skin:    General: Skin is warm and dry.     Findings: No rash.  Neurological:     General: No focal deficit present.     Mental Status: She is alert.     Cranial Nerves: No cranial nerve deficit (no facial droop, extraocular movements intact, no slurred speech).     Sensory: No sensory deficit.     Motor: No abnormal muscle tone or seizure activity.     Coordination: Coordination normal.  Psychiatric:        Mood and Affect: Mood normal.    ED Results / Procedures / Treatments   Labs (all labs ordered are listed, but only abnormal results are displayed) Labs Reviewed  COMPREHENSIVE METABOLIC PANEL - Abnormal; Notable for the following components:      Result Value   Potassium 5.2 (*)    CO2 12 (*)    BUN 80 (*)    Creatinine, Ser 12.65 (*)    GFR, Estimated 3 (*)    Anion gap 18 (*)    All other components within normal limits  LIPASE, BLOOD - Abnormal; Notable for the following components:   Lipase 207 (*)    All other components within normal limits  CBC WITH DIFFERENTIAL/PLATELET - Abnormal; Notable for the following components:   RBC 3.63 (*)    Hemoglobin 10.9 (*)    HCT 34.1 (*)    All other components within normal limits    EKG None  Radiology CT ABDOMEN PELVIS W CONTRAST  Result Date: 05/15/2021 CLINICAL DATA:  Abdominal pain and diarrhea. EXAM: CT ABDOMEN AND PELVIS WITH CONTRAST TECHNIQUE: Multidetector CT imaging of the abdomen and pelvis was performed using the standard protocol following bolus administration of intravenous contrast. CONTRAST:  74m OMNIPAQUE IOHEXOL 350 MG/ML SOLN COMPARISON:  July 22, 2018 FINDINGS: Lower chest: No acute abnormality. Hepatobiliary: No focal liver abnormality is seen. No gallstones, gallbladder wall thickening, or biliary dilatation. Pancreas: There is mild, stable prominence of the distal aspect of the  pancreatic duct (approximately 5 mm). No inflammatory changes are seen surrounding the pancreas. Spleen: No splenic injury or perisplenic hematoma. Adrenals/Urinary Tract: The right adrenal gland. Kidneys are normal, without renal calculi, focal lesion, or hydronephrosis. Bladder is unremarkable. Stomach/Bowel: Stomach is within normal limits. Appendix appears normal. No evidence of bowel dilatation. Mild thickening of the ascending colon is seen. Vascular/Lymphatic: Aortic atherosclerosis. 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 No enlarged abdominal or pelvic lymph nodes. Reproductive: Status post hysterectomy. No adnexal masses. Other: No abdominal wall hernia or abnormality. No abdominopelvic ascites. Musculoskeletal: No acute or significant osseous findings. IMPRESSION: 1. Findings suggestive of mild colitis involving the ascending colon. 2. 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 Electronically Signed   By: TVirgina NorfolkM.D.   On: 05/15/2021 20:20    Procedures Procedures   Medications Ordered in ED Medications  cefTRIAXone (ROCEPHIN) 1 g in sodium chloride 0.9 % 100 mL IVPB (has no administration in time range)  morphine 4 MG/ML injection 4 mg (4 mg Intravenous Given 05/15/21  1853)  ondansetron (ZOFRAN) injection 4 mg (4 mg Intravenous Given 05/15/21 1852)  hydrALAZINE (APRESOLINE) tablet 50 mg (50 mg Oral Given 05/15/21 1932)  iohexol (OMNIPAQUE) 350 MG/ML injection 75 mL (75 mLs Intravenous Contrast Given 05/15/21 2000)    ED Course  I have reviewed the triage vital signs and the nursing notes.  Pertinent labs & imaging results that were available during my care of the patient were reviewed by me and considered in my medical decision making (see chart for details).  Clinical Course as of 05/15/21 2113  Fri May 15, 2021  2029 CT scan suggestive of colitis [JK]    Clinical Course User Index [JK] Dorie Rank, MD   MDM Rules/Calculators/A&P                            Patient presented to the ED with complaints of abdominal pain and rectal pain.  Patient did have tenderness on exam.  Presentation concerning for the possibility of colitis, diverticulitis.  Rectal exam did not show any signs of mass.  No gross bleeding..  Laboratory tests are consistent with her chronic kidney disease.  No acute electrolyte abnormalities.  CT scan was performed and does show findings suggest mild colitis.  Patient is having frequent diarrhea.  We will have her start taking antibiotics.  Patient is afebrile and nontoxic.  I think she can manage with outpatient treatment.  Discussed with pharmacy and she will take Omnicef and Flagyl.   I will have the patient take hydrocodone as needed for pain.  She can also take Imodium for her diarrhea. Final Clinical Impression(s) / ED Diagnoses Final diagnoses:  Colitis  Hypertension, unspecified type  Stage 5 chronic kidney disease on chronic dialysis [facility])    Rx / DC Orders ED Discharge Orders          Ordered    cefdinir (OMNICEF) 300 MG capsule        05/15/21 2112    metroNIDAZOLE (FLAGYL) 500 MG tablet  2 times daily        05/15/21 2112    HYDROcodone-acetaminophen (NORCO/VICODIN) 5-325 MG tablet  Every 6 hours PRN        05/15/21 2112             Dorie Rank, MD 05/15/21 2115

## 2021-05-22 DIAGNOSIS — N186 End stage renal disease: Secondary | ICD-10-CM | POA: Diagnosis not present

## 2021-05-22 DIAGNOSIS — N2581 Secondary hyperparathyroidism of renal origin: Secondary | ICD-10-CM | POA: Diagnosis not present

## 2021-05-22 DIAGNOSIS — D509 Iron deficiency anemia, unspecified: Secondary | ICD-10-CM | POA: Diagnosis not present

## 2021-05-22 DIAGNOSIS — E785 Hyperlipidemia, unspecified: Secondary | ICD-10-CM | POA: Diagnosis not present

## 2021-05-22 DIAGNOSIS — D631 Anemia in chronic kidney disease: Secondary | ICD-10-CM | POA: Diagnosis not present

## 2021-05-24 DIAGNOSIS — N186 End stage renal disease: Secondary | ICD-10-CM | POA: Diagnosis not present

## 2021-05-24 DIAGNOSIS — Z992 Dependence on renal dialysis: Secondary | ICD-10-CM | POA: Diagnosis not present

## 2021-05-27 DIAGNOSIS — E11621 Type 2 diabetes mellitus with foot ulcer: Secondary | ICD-10-CM | POA: Diagnosis not present

## 2021-05-27 DIAGNOSIS — D509 Iron deficiency anemia, unspecified: Secondary | ICD-10-CM | POA: Diagnosis not present

## 2021-05-27 DIAGNOSIS — D631 Anemia in chronic kidney disease: Secondary | ICD-10-CM | POA: Diagnosis not present

## 2021-05-27 DIAGNOSIS — N2581 Secondary hyperparathyroidism of renal origin: Secondary | ICD-10-CM | POA: Diagnosis not present

## 2021-05-27 DIAGNOSIS — N186 End stage renal disease: Secondary | ICD-10-CM | POA: Diagnosis not present

## 2021-06-01 DIAGNOSIS — D509 Iron deficiency anemia, unspecified: Secondary | ICD-10-CM | POA: Diagnosis not present

## 2021-06-01 DIAGNOSIS — N186 End stage renal disease: Secondary | ICD-10-CM | POA: Diagnosis not present

## 2021-06-01 DIAGNOSIS — D631 Anemia in chronic kidney disease: Secondary | ICD-10-CM | POA: Diagnosis not present

## 2021-06-01 DIAGNOSIS — N2581 Secondary hyperparathyroidism of renal origin: Secondary | ICD-10-CM | POA: Diagnosis not present

## 2021-06-03 DIAGNOSIS — D631 Anemia in chronic kidney disease: Secondary | ICD-10-CM | POA: Diagnosis not present

## 2021-06-03 DIAGNOSIS — D509 Iron deficiency anemia, unspecified: Secondary | ICD-10-CM | POA: Diagnosis not present

## 2021-06-03 DIAGNOSIS — N186 End stage renal disease: Secondary | ICD-10-CM | POA: Diagnosis not present

## 2021-06-03 DIAGNOSIS — N2581 Secondary hyperparathyroidism of renal origin: Secondary | ICD-10-CM | POA: Diagnosis not present

## 2021-06-05 DIAGNOSIS — D631 Anemia in chronic kidney disease: Secondary | ICD-10-CM | POA: Diagnosis not present

## 2021-06-05 DIAGNOSIS — D509 Iron deficiency anemia, unspecified: Secondary | ICD-10-CM | POA: Diagnosis not present

## 2021-06-05 DIAGNOSIS — N186 End stage renal disease: Secondary | ICD-10-CM | POA: Diagnosis not present

## 2021-06-05 DIAGNOSIS — N2581 Secondary hyperparathyroidism of renal origin: Secondary | ICD-10-CM | POA: Diagnosis not present

## 2021-06-08 DIAGNOSIS — N186 End stage renal disease: Secondary | ICD-10-CM | POA: Diagnosis not present

## 2021-06-08 DIAGNOSIS — N2581 Secondary hyperparathyroidism of renal origin: Secondary | ICD-10-CM | POA: Diagnosis not present

## 2021-06-08 DIAGNOSIS — D631 Anemia in chronic kidney disease: Secondary | ICD-10-CM | POA: Diagnosis not present

## 2021-06-08 DIAGNOSIS — D509 Iron deficiency anemia, unspecified: Secondary | ICD-10-CM | POA: Diagnosis not present

## 2021-06-12 DIAGNOSIS — D631 Anemia in chronic kidney disease: Secondary | ICD-10-CM | POA: Diagnosis not present

## 2021-06-12 DIAGNOSIS — D509 Iron deficiency anemia, unspecified: Secondary | ICD-10-CM | POA: Diagnosis not present

## 2021-06-12 DIAGNOSIS — N2581 Secondary hyperparathyroidism of renal origin: Secondary | ICD-10-CM | POA: Diagnosis not present

## 2021-06-12 DIAGNOSIS — N186 End stage renal disease: Secondary | ICD-10-CM | POA: Diagnosis not present

## 2021-06-15 DIAGNOSIS — D509 Iron deficiency anemia, unspecified: Secondary | ICD-10-CM | POA: Diagnosis not present

## 2021-06-15 DIAGNOSIS — N186 End stage renal disease: Secondary | ICD-10-CM | POA: Diagnosis not present

## 2021-06-15 DIAGNOSIS — D631 Anemia in chronic kidney disease: Secondary | ICD-10-CM | POA: Diagnosis not present

## 2021-06-15 DIAGNOSIS — N2581 Secondary hyperparathyroidism of renal origin: Secondary | ICD-10-CM | POA: Diagnosis not present

## 2021-06-17 DIAGNOSIS — D509 Iron deficiency anemia, unspecified: Secondary | ICD-10-CM | POA: Diagnosis not present

## 2021-06-17 DIAGNOSIS — N186 End stage renal disease: Secondary | ICD-10-CM | POA: Diagnosis not present

## 2021-06-17 DIAGNOSIS — N2581 Secondary hyperparathyroidism of renal origin: Secondary | ICD-10-CM | POA: Diagnosis not present

## 2021-06-17 DIAGNOSIS — D631 Anemia in chronic kidney disease: Secondary | ICD-10-CM | POA: Diagnosis not present

## 2021-06-19 ENCOUNTER — Encounter: Payer: Self-pay | Admitting: Internal Medicine

## 2021-06-19 ENCOUNTER — Ambulatory Visit (INDEPENDENT_AMBULATORY_CARE_PROVIDER_SITE_OTHER): Payer: Medicare Other | Admitting: Internal Medicine

## 2021-06-19 DIAGNOSIS — A09 Infectious gastroenteritis and colitis, unspecified: Secondary | ICD-10-CM | POA: Diagnosis not present

## 2021-06-19 DIAGNOSIS — K602 Anal fissure, unspecified: Secondary | ICD-10-CM | POA: Diagnosis not present

## 2021-06-19 MED ORDER — DILTIAZEM GEL 2 %: 1.0000 "application " | Freq: Every day | CUTANEOUS | 1 refills | Status: DC

## 2021-06-19 NOTE — Patient Instructions (Signed)
We have sent a prescription for Diltazem gel to [facility]. You should apply a pea size amount to your rectum three times daily x 6-8 weeks.  [facility]'s information is below: Address: [address] [place], [place] [address]  [phone]  *Please DO NOT go directly from our office to pick up this medication! Give the pharmacy 1 day to process the prescription as this is compounded and takes time to make.   Take 2 tablespoons of Citrucel in water or juice daily  Follow up as needed with Dr. Carlean Purl

## 2021-06-19 NOTE — Progress Notes (Signed)
HISTORY OF PRESENT ILLNESS:  Tammy Moses is a 55 y.o. female with multiple significant medical problems as listed below, including longstanding diabetes mellitus, end-stage renal disease on hemodialysis, who presents today after having had problems with severe diarrhea.  She also mentions problems with "painful hemorrhoids".  She is actually a previous patient of Dr. Silvano Rusk but was advertently placed on my schedule today.  Previous evaluations with Dr. Arelia Longest included upper endoscopy April 2015 to evaluate epigastric pain.  Abnormal appearing gastric mucosa was biopsied and did not show any significant abnormalities.  She also was noted to have some retained food in the gastric fundus.  She was treated with PPI therapy and promotility agent.  She is also undergone previous colonoscopy in October 2010.  The examination was normal.  The preparation was excellent.  Patient tells me that she was in her usual state of health until mid July 2022 when she developed severe diarrhea with associated incontinence.  She was seen in the emergency department on May 13, 2021 and again on May 15, 2021.  CT scan of the abdomen and pelvis with contrast May 15, 2021 revealed mild right-sided colitis.  She was to submit stool studies but did not.  Her symptoms improved.  Over the past week or 2 her bowels have been back to baseline.  She does tell me that she has chronic intermittent problems with rectal discomfort and itching.  She believes this is due to hemorrhoids.  Review of blood work from May 27, 2021 shows hemoglobin 10.9  REVIEW OF SYSTEMS:  All non-GI ROS negative unless otherwise stated in the HPI except for anxiety, visual impairment  Past Medical History:  Diagnosis Date   Anemia    Anxiety    Blind right eye    Chronic kidney disease, stage V (HCC)    Constipation, chronic    Dental caries    Diabetes mellitus    Diabetic neuropathy ([place])    Diabetic retinopathy    GERD (gastroesophageal  reflux disease)    Glaucoma    H. pylori infection    Hepatitis C carrier ([place])    High risk sexual behavior    Hyperlipidemia    Hypertension    Hypoglycemia 07/12/2017   Insomnia    Microalbuminuria    Nonspecific elevation of levels of transaminase or lactic acid dehydrogenase (LDH)    Tobacco dependence    Vitamin D deficiency     Past Surgical History:  Procedure Laterality Date   ABDOMINAL HYSTERECTOMY  04/30/2009   PARTIAL   COLONOSCOPY     DIALYSIS FISTULA CREATION Left 06/2017   ESOPHAGOGASTRODUODENOSCOPY      Social History HAILO VINEYARD  reports that she has been smoking cigarettes. She has been smoking an average of .25 packs per day. She has never used smokeless tobacco. She reports current alcohol use. She reports that she does not currently use drugs after having used the following drugs: Marijuana and Cocaine.  family history includes Cerebral palsy in her daughter; Diabetes in her father and mother; High blood pressure in her father and mother; Other in her son; Thyroid disease in her mother.  Allergies  Allergen Reactions   Acyclovir And Related Itching       PHYSICAL EXAMINATION: Vital signs: There were no vitals taken for this visit.  Constitutional: Thin chronically ill-appearing female, no acute distress Psychiatric: alert and oriented x3, cooperative Eyes: Right eye blind.  Left eye without icterus Mouth: oral pharynx moist, no lesions.  Poor dentition Neck: supple no lymphadenopathy Cardiovascular: heart regular rate and rhythm, no murmur Lungs: clear to auscultation bilaterally Abdomen: soft, nontender, nondistended, no obvious ascites, no peritoneal signs, normal bowel sounds, no organomegaly Rectal: No external abnormalities.  No hemorrhoids.  Small tender posterior fissure Extremities: no clubbing, cyanosis, or lower extremity edema bilaterally Skin: no lesions on visible extremities Neuro: No focal deficits.  Cranial nerves  intact  ASSESSMENT:  1.  Acute infectious colitis.  Resolved 2.  Normal colonoscopy 2010 3.  Posterior anal fissure 4.  Multiple medical problems   PLAN:  1.  Citrucel or Metamucil 2 tablespoons daily 2.  2% diltiazem ointment 5 times daily.  Proper way to apply reviewed with the patient personally. 3.  Resume general medical care with PCP and nephrology 4.  GI follow-up as needed with Dr. Carlean Purl A total time of 45 minutes was spent preparing to see the patient, reviewing x-rays and laboratories.  Also, obtaining comprehensive history, reviewing emergency room evaluations, performing comprehensive physical examination, counseling the patient regarding her above listed issues, ordering medications, and documenting clinical information in the health record

## 2021-06-20 DIAGNOSIS — D631 Anemia in chronic kidney disease: Secondary | ICD-10-CM | POA: Diagnosis not present

## 2021-06-20 DIAGNOSIS — N186 End stage renal disease: Secondary | ICD-10-CM | POA: Diagnosis not present

## 2021-06-20 DIAGNOSIS — D509 Iron deficiency anemia, unspecified: Secondary | ICD-10-CM | POA: Diagnosis not present

## 2021-06-20 DIAGNOSIS — N2581 Secondary hyperparathyroidism of renal origin: Secondary | ICD-10-CM | POA: Diagnosis not present

## 2021-06-22 DIAGNOSIS — D631 Anemia in chronic kidney disease: Secondary | ICD-10-CM | POA: Diagnosis not present

## 2021-06-22 DIAGNOSIS — D509 Iron deficiency anemia, unspecified: Secondary | ICD-10-CM | POA: Diagnosis not present

## 2021-06-22 DIAGNOSIS — N2581 Secondary hyperparathyroidism of renal origin: Secondary | ICD-10-CM | POA: Diagnosis not present

## 2021-06-22 DIAGNOSIS — N186 End stage renal disease: Secondary | ICD-10-CM | POA: Diagnosis not present

## 2021-06-24 DIAGNOSIS — N186 End stage renal disease: Secondary | ICD-10-CM | POA: Diagnosis not present

## 2021-06-24 DIAGNOSIS — D631 Anemia in chronic kidney disease: Secondary | ICD-10-CM | POA: Diagnosis not present

## 2021-06-24 DIAGNOSIS — D509 Iron deficiency anemia, unspecified: Secondary | ICD-10-CM | POA: Diagnosis not present

## 2021-06-24 DIAGNOSIS — N2581 Secondary hyperparathyroidism of renal origin: Secondary | ICD-10-CM | POA: Diagnosis not present

## 2021-06-24 DIAGNOSIS — Z992 Dependence on renal dialysis: Secondary | ICD-10-CM | POA: Diagnosis not present

## 2021-07-03 DIAGNOSIS — N186 End stage renal disease: Secondary | ICD-10-CM | POA: Diagnosis not present

## 2021-07-03 DIAGNOSIS — D509 Iron deficiency anemia, unspecified: Secondary | ICD-10-CM | POA: Diagnosis not present

## 2021-07-03 DIAGNOSIS — N2581 Secondary hyperparathyroidism of renal origin: Secondary | ICD-10-CM | POA: Diagnosis not present

## 2021-07-03 DIAGNOSIS — D631 Anemia in chronic kidney disease: Secondary | ICD-10-CM | POA: Diagnosis not present

## 2021-07-03 DIAGNOSIS — E11621 Type 2 diabetes mellitus with foot ulcer: Secondary | ICD-10-CM | POA: Diagnosis not present

## 2021-07-06 DIAGNOSIS — N2581 Secondary hyperparathyroidism of renal origin: Secondary | ICD-10-CM | POA: Diagnosis not present

## 2021-07-06 DIAGNOSIS — D631 Anemia in chronic kidney disease: Secondary | ICD-10-CM | POA: Diagnosis not present

## 2021-07-06 DIAGNOSIS — D509 Iron deficiency anemia, unspecified: Secondary | ICD-10-CM | POA: Diagnosis not present

## 2021-07-06 DIAGNOSIS — N186 End stage renal disease: Secondary | ICD-10-CM | POA: Diagnosis not present

## 2021-07-08 DIAGNOSIS — N2581 Secondary hyperparathyroidism of renal origin: Secondary | ICD-10-CM | POA: Diagnosis not present

## 2021-07-08 DIAGNOSIS — D631 Anemia in chronic kidney disease: Secondary | ICD-10-CM | POA: Diagnosis not present

## 2021-07-08 DIAGNOSIS — N186 End stage renal disease: Secondary | ICD-10-CM | POA: Diagnosis not present

## 2021-07-08 DIAGNOSIS — D509 Iron deficiency anemia, unspecified: Secondary | ICD-10-CM | POA: Diagnosis not present

## 2021-07-13 DIAGNOSIS — D509 Iron deficiency anemia, unspecified: Secondary | ICD-10-CM | POA: Diagnosis not present

## 2021-07-13 DIAGNOSIS — D631 Anemia in chronic kidney disease: Secondary | ICD-10-CM | POA: Diagnosis not present

## 2021-07-13 DIAGNOSIS — N186 End stage renal disease: Secondary | ICD-10-CM | POA: Diagnosis not present

## 2021-07-13 DIAGNOSIS — N2581 Secondary hyperparathyroidism of renal origin: Secondary | ICD-10-CM | POA: Diagnosis not present

## 2021-07-22 DIAGNOSIS — D509 Iron deficiency anemia, unspecified: Secondary | ICD-10-CM | POA: Diagnosis not present

## 2021-07-22 DIAGNOSIS — D631 Anemia in chronic kidney disease: Secondary | ICD-10-CM | POA: Diagnosis not present

## 2021-07-22 DIAGNOSIS — N186 End stage renal disease: Secondary | ICD-10-CM | POA: Diagnosis not present

## 2021-07-22 DIAGNOSIS — N2581 Secondary hyperparathyroidism of renal origin: Secondary | ICD-10-CM | POA: Diagnosis not present

## 2021-07-24 DIAGNOSIS — Z992 Dependence on renal dialysis: Secondary | ICD-10-CM | POA: Diagnosis not present

## 2021-07-24 DIAGNOSIS — N186 End stage renal disease: Secondary | ICD-10-CM | POA: Diagnosis not present

## 2021-08-03 DIAGNOSIS — E785 Hyperlipidemia, unspecified: Secondary | ICD-10-CM | POA: Diagnosis not present

## 2021-08-03 DIAGNOSIS — Z23 Encounter for immunization: Secondary | ICD-10-CM | POA: Diagnosis not present

## 2021-08-03 DIAGNOSIS — D631 Anemia in chronic kidney disease: Secondary | ICD-10-CM | POA: Diagnosis not present

## 2021-08-03 DIAGNOSIS — N186 End stage renal disease: Secondary | ICD-10-CM | POA: Diagnosis not present

## 2021-08-03 DIAGNOSIS — N2581 Secondary hyperparathyroidism of renal origin: Secondary | ICD-10-CM | POA: Diagnosis not present

## 2021-08-04 ENCOUNTER — Ambulatory Visit: Payer: Medicare Other | Admitting: Podiatry

## 2021-08-05 DIAGNOSIS — Z23 Encounter for immunization: Secondary | ICD-10-CM | POA: Diagnosis not present

## 2021-08-05 DIAGNOSIS — N2581 Secondary hyperparathyroidism of renal origin: Secondary | ICD-10-CM | POA: Diagnosis not present

## 2021-08-05 DIAGNOSIS — D631 Anemia in chronic kidney disease: Secondary | ICD-10-CM | POA: Diagnosis not present

## 2021-08-05 DIAGNOSIS — N186 End stage renal disease: Secondary | ICD-10-CM | POA: Diagnosis not present

## 2021-08-05 DIAGNOSIS — E785 Hyperlipidemia, unspecified: Secondary | ICD-10-CM | POA: Diagnosis not present

## 2021-08-10 DIAGNOSIS — E785 Hyperlipidemia, unspecified: Secondary | ICD-10-CM | POA: Diagnosis not present

## 2021-08-10 DIAGNOSIS — N2581 Secondary hyperparathyroidism of renal origin: Secondary | ICD-10-CM | POA: Diagnosis not present

## 2021-08-10 DIAGNOSIS — Z23 Encounter for immunization: Secondary | ICD-10-CM | POA: Diagnosis not present

## 2021-08-10 DIAGNOSIS — D631 Anemia in chronic kidney disease: Secondary | ICD-10-CM | POA: Diagnosis not present

## 2021-08-10 DIAGNOSIS — N186 End stage renal disease: Secondary | ICD-10-CM | POA: Diagnosis not present

## 2021-08-19 DIAGNOSIS — Z23 Encounter for immunization: Secondary | ICD-10-CM | POA: Diagnosis not present

## 2021-08-19 DIAGNOSIS — D631 Anemia in chronic kidney disease: Secondary | ICD-10-CM | POA: Diagnosis not present

## 2021-08-19 DIAGNOSIS — N186 End stage renal disease: Secondary | ICD-10-CM | POA: Diagnosis not present

## 2021-08-19 DIAGNOSIS — N2581 Secondary hyperparathyroidism of renal origin: Secondary | ICD-10-CM | POA: Diagnosis not present

## 2021-08-19 DIAGNOSIS — E785 Hyperlipidemia, unspecified: Secondary | ICD-10-CM | POA: Diagnosis not present

## 2021-08-24 DIAGNOSIS — Z992 Dependence on renal dialysis: Secondary | ICD-10-CM | POA: Diagnosis not present

## 2021-08-24 DIAGNOSIS — Z23 Encounter for immunization: Secondary | ICD-10-CM | POA: Diagnosis not present

## 2021-08-24 DIAGNOSIS — N186 End stage renal disease: Secondary | ICD-10-CM | POA: Diagnosis not present

## 2021-08-24 DIAGNOSIS — D631 Anemia in chronic kidney disease: Secondary | ICD-10-CM | POA: Diagnosis not present

## 2021-08-24 DIAGNOSIS — E785 Hyperlipidemia, unspecified: Secondary | ICD-10-CM | POA: Diagnosis not present

## 2021-08-24 DIAGNOSIS — N2581 Secondary hyperparathyroidism of renal origin: Secondary | ICD-10-CM | POA: Diagnosis not present

## 2021-09-16 ENCOUNTER — Inpatient Hospital Stay (HOSPITAL_COMMUNITY)
Admission: EM | Admit: 2021-09-16 | Discharge: 2021-09-24 | DRG: 553 | Disposition: A | Discharge status: Home Health | Payer: Medicare Other | Attending: Internal Medicine | Admitting: Internal Medicine

## 2021-09-16 ENCOUNTER — Encounter (HOSPITAL_COMMUNITY): Payer: Self-pay | Admitting: Emergency Medicine

## 2021-09-16 ENCOUNTER — Other Ambulatory Visit: Payer: Self-pay

## 2021-09-16 ENCOUNTER — Emergency Department (HOSPITAL_COMMUNITY): Payer: Medicare Other

## 2021-09-16 DIAGNOSIS — Z992 Dependence on renal dialysis: Secondary | ICD-10-CM

## 2021-09-16 DIAGNOSIS — E119 Type 2 diabetes mellitus without complications: Secondary | ICD-10-CM

## 2021-09-16 DIAGNOSIS — N186 End stage renal disease: Secondary | ICD-10-CM | POA: Diagnosis present

## 2021-09-16 DIAGNOSIS — N185 Chronic kidney disease, stage 5: Secondary | ICD-10-CM | POA: Diagnosis not present

## 2021-09-16 DIAGNOSIS — Z888 Allergy status to other drugs, medicaments and biological substances status: Secondary | ICD-10-CM | POA: Diagnosis not present

## 2021-09-16 DIAGNOSIS — H5461 Unqualified visual loss, right eye, normal vision left eye: Secondary | ICD-10-CM | POA: Diagnosis present

## 2021-09-16 DIAGNOSIS — E559 Vitamin D deficiency, unspecified: Secondary | ICD-10-CM | POA: Diagnosis present

## 2021-09-16 DIAGNOSIS — F17210 Nicotine dependence, cigarettes, uncomplicated: Secondary | ICD-10-CM | POA: Diagnosis present

## 2021-09-16 DIAGNOSIS — E1122 Type 2 diabetes mellitus with diabetic chronic kidney disease: Secondary | ICD-10-CM | POA: Diagnosis present

## 2021-09-16 DIAGNOSIS — E872 Acidosis, unspecified: Secondary | ICD-10-CM | POA: Diagnosis present

## 2021-09-16 DIAGNOSIS — Z794 Long term (current) use of insulin: Secondary | ICD-10-CM

## 2021-09-16 DIAGNOSIS — M109 Gout, unspecified: Principal | ICD-10-CM | POA: Diagnosis present

## 2021-09-16 DIAGNOSIS — E785 Hyperlipidemia, unspecified: Secondary | ICD-10-CM | POA: Diagnosis present

## 2021-09-16 DIAGNOSIS — A4189 Other specified sepsis: Secondary | ICD-10-CM

## 2021-09-16 DIAGNOSIS — K219 Gastro-esophageal reflux disease without esophagitis: Secondary | ICD-10-CM | POA: Diagnosis present

## 2021-09-16 DIAGNOSIS — Z8349 Family history of other endocrine, nutritional and metabolic diseases: Secondary | ICD-10-CM | POA: Diagnosis not present

## 2021-09-16 DIAGNOSIS — I10 Essential (primary) hypertension: Secondary | ICD-10-CM | POA: Diagnosis not present

## 2021-09-16 DIAGNOSIS — M79671 Pain in right foot: Secondary | ICD-10-CM | POA: Diagnosis not present

## 2021-09-16 DIAGNOSIS — Z20822 Contact with and (suspected) exposure to covid-19: Secondary | ICD-10-CM | POA: Diagnosis present

## 2021-09-16 DIAGNOSIS — J9601 Acute respiratory failure with hypoxia: Secondary | ICD-10-CM | POA: Diagnosis present

## 2021-09-16 DIAGNOSIS — I248 Other forms of acute ischemic heart disease: Secondary | ICD-10-CM | POA: Diagnosis present

## 2021-09-16 DIAGNOSIS — Z833 Family history of diabetes mellitus: Secondary | ICD-10-CM | POA: Diagnosis not present

## 2021-09-16 DIAGNOSIS — D631 Anemia in chronic kidney disease: Secondary | ICD-10-CM | POA: Diagnosis present

## 2021-09-16 DIAGNOSIS — I120 Hypertensive chronic kidney disease with stage 5 chronic kidney disease or end stage renal disease: Secondary | ICD-10-CM | POA: Diagnosis present

## 2021-09-16 DIAGNOSIS — J449 Chronic obstructive pulmonary disease, unspecified: Secondary | ICD-10-CM | POA: Diagnosis present

## 2021-09-16 DIAGNOSIS — R6510 Systemic inflammatory response syndrome (SIRS) of non-infectious origin without acute organ dysfunction: Secondary | ICD-10-CM | POA: Diagnosis present

## 2021-09-16 DIAGNOSIS — Z79899 Other long term (current) drug therapy: Secondary | ICD-10-CM

## 2021-09-16 DIAGNOSIS — R7989 Other specified abnormal findings of blood chemistry: Secondary | ICD-10-CM | POA: Diagnosis present

## 2021-09-16 DIAGNOSIS — N189 Chronic kidney disease, unspecified: Secondary | ICD-10-CM | POA: Diagnosis present

## 2021-09-16 DIAGNOSIS — G4700 Insomnia, unspecified: Secondary | ICD-10-CM | POA: Diagnosis present

## 2021-09-16 DIAGNOSIS — I5021 Acute systolic (congestive) heart failure: Secondary | ICD-10-CM | POA: Diagnosis not present

## 2021-09-16 DIAGNOSIS — R509 Fever, unspecified: Secondary | ICD-10-CM

## 2021-09-16 DIAGNOSIS — B182 Chronic viral hepatitis C: Secondary | ICD-10-CM | POA: Diagnosis present

## 2021-09-16 DIAGNOSIS — N184 Chronic kidney disease, stage 4 (severe): Secondary | ICD-10-CM | POA: Diagnosis not present

## 2021-09-16 DIAGNOSIS — Z91199 Patient's noncompliance with other medical treatment and regimen due to unspecified reason: Secondary | ICD-10-CM

## 2021-09-16 DIAGNOSIS — F419 Anxiety disorder, unspecified: Secondary | ICD-10-CM | POA: Diagnosis present

## 2021-09-16 DIAGNOSIS — R778 Other specified abnormalities of plasma proteins: Secondary | ICD-10-CM | POA: Diagnosis not present

## 2021-09-16 DIAGNOSIS — E8729 Other acidosis: Secondary | ICD-10-CM | POA: Insufficient documentation

## 2021-09-16 DIAGNOSIS — A419 Sepsis, unspecified organism: Secondary | ICD-10-CM | POA: Diagnosis not present

## 2021-09-16 DIAGNOSIS — M79673 Pain in unspecified foot: Secondary | ICD-10-CM

## 2021-09-16 LAB — CBC WITH DIFFERENTIAL/PLATELET
Abs Immature Granulocytes: 0.06 10*3/uL (ref 0.00–0.07)
Basophils Absolute: 0.1 10*3/uL (ref 0.0–0.1)
Basophils Relative: 1 %
Eosinophils Absolute: 0 10*3/uL (ref 0.0–0.5)
Eosinophils Relative: 0 %
HCT: 31.9 % — ABNORMAL LOW (ref 36.0–46.0)
Hemoglobin: 10.1 g/dL — ABNORMAL LOW (ref 12.0–15.0)
Immature Granulocytes: 1 %
Lymphocytes Relative: 3 %
Lymphs Abs: 0.4 10*3/uL — ABNORMAL LOW (ref 0.7–4.0)
MCH: 29 pg (ref 26.0–34.0)
MCHC: 31.7 g/dL (ref 30.0–36.0)
MCV: 91.7 fL (ref 80.0–100.0)
Monocytes Absolute: 1.1 10*3/uL — ABNORMAL HIGH (ref 0.1–1.0)
Monocytes Relative: 10 %
Neutro Abs: 9.9 10*3/uL — ABNORMAL HIGH (ref 1.7–7.7)
Neutrophils Relative %: 85 %
Platelets: 354 10*3/uL (ref 150–400)
RBC: 3.48 MIL/uL — ABNORMAL LOW (ref 3.87–5.11)
RDW: 15.8 % — ABNORMAL HIGH (ref 11.5–15.5)
WBC: 11.6 10*3/uL — ABNORMAL HIGH (ref 4.0–10.5)
nRBC: 0 % (ref 0.0–0.2)

## 2021-09-16 LAB — COMPREHENSIVE METABOLIC PANEL
ALT: 10 U/L (ref 0–44)
AST: 13 U/L — ABNORMAL LOW (ref 15–41)
Albumin: 3.1 g/dL — ABNORMAL LOW (ref 3.5–5.0)
Alkaline Phosphatase: 62 U/L (ref 38–126)
Anion gap: 23 — ABNORMAL HIGH (ref 5–15)
BUN: 81 mg/dL — ABNORMAL HIGH (ref 6–20)
CO2: 12 mmol/L — ABNORMAL LOW (ref 22–32)
Calcium: 8.2 mg/dL — ABNORMAL LOW (ref 8.9–10.3)
Chloride: 99 mmol/L (ref 98–111)
Creatinine, Ser: 15.59 mg/dL — ABNORMAL HIGH (ref 0.44–1.00)
GFR, Estimated: 2 mL/min — ABNORMAL LOW (ref >60)
Glucose, Bld: 143 mg/dL — ABNORMAL HIGH (ref 70–99)
Potassium: 4.7 mmol/L (ref 3.5–5.1)
Sodium: 134 mmol/L — ABNORMAL LOW (ref 135–145)
Total Bilirubin: 0.6 mg/dL (ref 0.3–1.2)
Total Protein: 7.7 g/dL (ref 6.5–8.1)

## 2021-09-16 LAB — URINALYSIS, ROUTINE W REFLEX MICROSCOPIC
Bilirubin Urine: NEGATIVE
Glucose, UA: >=500 mg/dL — AB
Ketones, ur: 5 mg/dL — AB
Leukocytes,Ua: NEGATIVE
Nitrite: NEGATIVE
Protein, ur: 100 mg/dL — AB
Specific Gravity, Urine: 1.013 (ref 1.005–1.030)
pH: 5 (ref 5.0–8.0)

## 2021-09-16 LAB — I-STAT BETA HCG BLOOD, ED (MC, WL, AP ONLY): I-stat hCG, quantitative: 6.4 m[IU]/mL — ABNORMAL HIGH (ref <5)

## 2021-09-16 LAB — TROPONIN I (HIGH SENSITIVITY)
Troponin I (High Sensitivity): 94 ng/L — ABNORMAL HIGH (ref <18)
Troponin I (High Sensitivity): 210 ng/L (ref <18)

## 2021-09-16 LAB — LACTIC ACID, PLASMA
Lactic Acid, Venous: 1.4 mmol/L (ref 0.5–1.9)
Lactic Acid, Venous: 1 mmol/L (ref 0.5–1.9)

## 2021-09-16 LAB — RESP PANEL BY RT-PCR (FLU A&B, COVID) ARPGX2
Influenza A by PCR: NEGATIVE
Influenza B by PCR: NEGATIVE
SARS Coronavirus 2 by RT PCR: NEGATIVE

## 2021-09-16 LAB — GLUCOSE, CAPILLARY: Glucose-Capillary: 105 mg/dL — ABNORMAL HIGH (ref 70–99)

## 2021-09-16 LAB — PROTIME-INR
INR: 1.2 (ref 0.8–1.2)
Prothrombin Time: 14.9 seconds (ref 11.4–15.2)

## 2021-09-16 LAB — APTT: aPTT: 28 seconds (ref 24–36)

## 2021-09-16 LAB — BRAIN NATRIURETIC PEPTIDE: B Natriuretic Peptide: 3461.4 pg/mL — ABNORMAL HIGH (ref 0.0–100.0)

## 2021-09-16 LAB — PROCALCITONIN: Procalcitonin: 2.97 ng/mL

## 2021-09-16 MED ORDER — CEFAZOLIN IN SODIUM CHLORIDE 3-0.9 GM/100ML-% IV SOLN
INTRAVENOUS | Status: AC
  Filled 2021-09-16: qty 100

## 2021-09-16 MED ORDER — BUPROPION HCL ER (SR) 150 MG PO TB12
150.0000 mg | ORAL_TABLET | Freq: Every day | ORAL | Status: DC
  Administered 2021-09-17 – 2021-09-24 (×8): 150 mg via ORAL
  Filled 2021-09-16 (×9): qty 1

## 2021-09-16 MED ORDER — LACTATED RINGERS IV BOLUS (SEPSIS)
500.0000 mL | Freq: Once | INTRAVENOUS | Status: AC
  Administered 2021-09-16: 500 mL via INTRAVENOUS

## 2021-09-16 MED ORDER — VANCOMYCIN HCL 500 MG/100ML IV SOLN
500.0000 mg | INTRAVENOUS | Status: DC
Start: 2021-09-18 — End: 2021-09-19
  Administered 2021-09-18: 500 mg via INTRAVENOUS
  Filled 2021-09-16: qty 100

## 2021-09-16 MED ORDER — TIMOLOL MALEATE 0.5 % OP SOLN
1.0000 [drp] | Freq: Every day | OPHTHALMIC | Status: DC
  Administered 2021-09-16 – 2021-09-23 (×8): 1 [drp] via OPHTHALMIC
  Filled 2021-09-16: qty 5

## 2021-09-16 MED ORDER — LAMOTRIGINE 25 MG PO TABS
25.0000 mg | ORAL_TABLET | Freq: Every day | ORAL | Status: DC
  Administered 2021-09-17 – 2021-09-24 (×8): 25 mg via ORAL
  Filled 2021-09-16 (×8): qty 1

## 2021-09-16 MED ORDER — LATANOPROST 0.005 % OP SOLN
1.0000 [drp] | Freq: Every day | OPHTHALMIC | Status: DC
  Administered 2021-09-16 – 2021-09-23 (×8): 1 [drp] via OPHTHALMIC
  Filled 2021-09-16: qty 2.5

## 2021-09-16 MED ORDER — ONDANSETRON HCL 4 MG/2ML IJ SOLN
INTRAMUSCULAR | Status: AC
  Filled 2021-09-16: qty 2

## 2021-09-16 MED ORDER — NICOTINE 14 MG/24HR TD PT24
14.0000 mg | MEDICATED_PATCH | Freq: Every day | TRANSDERMAL | Status: DC
  Administered 2021-09-16 – 2021-09-24 (×9): 14 mg via TRANSDERMAL
  Filled 2021-09-16 (×9): qty 1

## 2021-09-16 MED ORDER — METRONIDAZOLE 500 MG/100ML IV SOLN
500.0000 mg | Freq: Once | INTRAVENOUS | Status: AC
  Administered 2021-09-16: 500 mg via INTRAVENOUS
  Filled 2021-09-16: qty 100

## 2021-09-16 MED ORDER — IPRATROPIUM-ALBUTEROL 0.5-2.5 (3) MG/3ML IN SOLN
3.0000 mL | Freq: Four times a day (QID) | RESPIRATORY_TRACT | Status: AC
  Administered 2021-09-16 – 2021-09-17 (×3): 3 mL via RESPIRATORY_TRACT
  Filled 2021-09-16 (×3): qty 3

## 2021-09-16 MED ORDER — GABAPENTIN 600 MG PO TABS
600.0000 mg | ORAL_TABLET | Freq: Two times a day (BID) | ORAL | Status: DC
  Administered 2021-09-16 – 2021-09-24 (×16): 600 mg via ORAL
  Filled 2021-09-16 (×16): qty 1

## 2021-09-16 MED ORDER — FUROSEMIDE 40 MG PO TABS
40.0000 mg | ORAL_TABLET | Freq: Every day | ORAL | Status: DC
  Administered 2021-09-17 – 2021-09-24 (×8): 40 mg via ORAL
  Filled 2021-09-16 (×8): qty 1

## 2021-09-16 MED ORDER — AMLODIPINE BESYLATE 5 MG PO TABS: 10.0000 mg | ORAL_TABLET | Freq: Every day | ORAL | Status: DC

## 2021-09-16 MED ORDER — FENTANYL CITRATE (PF) 100 MCG/2ML IJ SOLN
INTRAMUSCULAR | Status: AC
  Filled 2021-09-16: qty 2

## 2021-09-16 MED ORDER — ACETAMINOPHEN 325 MG PO TABS
650.0000 mg | ORAL_TABLET | Freq: Once | ORAL | Status: AC
  Administered 2021-09-16: 650 mg via ORAL
  Filled 2021-09-16: qty 2

## 2021-09-16 MED ORDER — ALBUTEROL SULFATE (2.5 MG/3ML) 0.083% IN NEBU: 2.5000 mg | INHALATION_SOLUTION | RESPIRATORY_TRACT | Status: DC | PRN

## 2021-09-16 MED ORDER — DEXAMETHASONE SODIUM PHOSPHATE 4 MG/ML IJ SOLN
INTRAMUSCULAR | Status: AC
  Filled 2021-09-16: qty 1

## 2021-09-16 MED ORDER — SCOPOLAMINE 1 MG/3DAYS TD PT72
MEDICATED_PATCH | TRANSDERMAL | Status: AC
  Filled 2021-09-16: qty 1

## 2021-09-16 MED ORDER — ONDANSETRON HCL 4 MG PO TABS
4.0000 mg | ORAL_TABLET | Freq: Four times a day (QID) | ORAL | Status: DC | PRN
  Administered 2021-09-20: 09:00:00 4 mg via ORAL
  Filled 2021-09-16: qty 1

## 2021-09-16 MED ORDER — HEPARIN SODIUM (PORCINE) 5000 UNIT/ML IJ SOLN
5000.0000 [IU] | Freq: Three times a day (TID) | INTRAMUSCULAR | Status: DC
  Administered 2021-09-16 – 2021-09-24 (×21): 5000 [IU] via SUBCUTANEOUS
  Filled 2021-09-16 (×21): qty 1

## 2021-09-16 MED ORDER — ACETAMINOPHEN 650 MG RE SUPP: 650.0000 mg | Freq: Four times a day (QID) | RECTAL | Status: DC | PRN

## 2021-09-16 MED ORDER — PRAVASTATIN SODIUM 40 MG PO TABS
80.0000 mg | ORAL_TABLET | Freq: Every day | ORAL | Status: DC
  Administered 2021-09-16 – 2021-09-23 (×8): 80 mg via ORAL
  Filled 2021-09-16 (×8): qty 2

## 2021-09-16 MED ORDER — HYDRALAZINE HCL 100 MG PO TABS: 150.0000 mg | ORAL_TABLET | Freq: Two times a day (BID) | ORAL | Status: DC

## 2021-09-16 MED ORDER — CALCIUM ACETATE (PHOS BINDER) 667 MG PO CAPS
2001.0000 mg | ORAL_CAPSULE | Freq: Three times a day (TID) | ORAL | Status: DC
  Administered 2021-09-17 – 2021-09-24 (×20): 2001 mg via ORAL
  Filled 2021-09-16 (×21): qty 3

## 2021-09-16 MED ORDER — SODIUM CHLORIDE 0.9% FLUSH
3.0000 mL | Freq: Two times a day (BID) | INTRAVENOUS | Status: DC
  Administered 2021-09-16 – 2021-09-24 (×10): 3 mL via INTRAVENOUS

## 2021-09-16 MED ORDER — ACETAMINOPHEN 325 MG PO TABS
650.0000 mg | ORAL_TABLET | Freq: Four times a day (QID) | ORAL | Status: DC | PRN
  Administered 2021-09-17 – 2021-09-22 (×6): 650 mg via ORAL
  Filled 2021-09-16 (×8): qty 2

## 2021-09-16 MED ORDER — OXYTOCIN-SODIUM CHLORIDE 30-0.9 UT/500ML-% IV SOLN
INTRAVENOUS | Status: AC
  Filled 2021-09-16: qty 500

## 2021-09-16 MED ORDER — MORPHINE SULFATE (PF) 0.5 MG/ML IJ SOLN
INTRAMUSCULAR | Status: AC
  Filled 2021-09-16: qty 10

## 2021-09-16 MED ORDER — SODIUM CHLORIDE 0.9 % IV SOLN
2.0000 g | Freq: Once | INTRAVENOUS | Status: AC
  Administered 2021-09-16: 2 g via INTRAVENOUS
  Filled 2021-09-16: qty 2

## 2021-09-16 MED ORDER — VANCOMYCIN HCL 750 MG/150ML IV SOLN: 750.0000 mg | INTRAVENOUS | Status: DC

## 2021-09-16 MED ORDER — DORZOLAMIDE HCL-TIMOLOL MAL 2-0.5 % OP SOLN
1.0000 [drp] | Freq: Two times a day (BID) | OPHTHALMIC | Status: DC
  Administered 2021-09-16 – 2021-09-24 (×16): 1 [drp] via OPHTHALMIC
  Filled 2021-09-16: qty 10

## 2021-09-16 MED ORDER — ONDANSETRON HCL 4 MG/2ML IJ SOLN
4.0000 mg | Freq: Four times a day (QID) | INTRAMUSCULAR | Status: DC | PRN
  Filled 2021-09-16: qty 2

## 2021-09-16 MED ORDER — ALBUTEROL SULFATE HFA 108 (90 BASE) MCG/ACT IN AERS: 2.0000 | INHALATION_SPRAY | RESPIRATORY_TRACT | Status: DC | PRN

## 2021-09-16 MED ORDER — VANCOMYCIN HCL 1000 MG/200ML IV SOLN
1000.0000 mg | Freq: Once | INTRAVENOUS | Status: AC
  Administered 2021-09-16: 1000 mg via INTRAVENOUS
  Filled 2021-09-16: qty 200

## 2021-09-16 MED ORDER — SODIUM CHLORIDE 0.9 % IV SOLN: 250.0000 mL | INTRAVENOUS | Status: DC | PRN

## 2021-09-16 MED ORDER — SODIUM CHLORIDE 0.9 % IV SOLN
1.0000 g | Freq: Every day | INTRAVENOUS | Status: DC
  Administered 2021-09-17 – 2021-09-18 (×2): 1 g via INTRAVENOUS
  Filled 2021-09-16 (×3): qty 1

## 2021-09-16 MED ORDER — ATROPINE SULFATE 1 % OP SOLN
1.0000 [drp] | Freq: Two times a day (BID) | OPHTHALMIC | Status: DC
  Administered 2021-09-16 – 2021-09-24 (×16): 1 [drp] via OPHTHALMIC
  Filled 2021-09-16: qty 2

## 2021-09-16 MED ORDER — HYDRALAZINE HCL 20 MG/ML IJ SOLN: 10.0000 mg | Freq: Four times a day (QID) | INTRAMUSCULAR | Status: DC | PRN

## 2021-09-16 MED ORDER — SODIUM CHLORIDE 0.9% FLUSH
3.0000 mL | INTRAVENOUS | Status: DC | PRN
  Administered 2021-09-16: 3 mL via INTRAVENOUS

## 2021-09-16 MED ORDER — INSULIN ASPART 100 UNIT/ML IJ SOLN
0.0000 [IU] | Freq: Three times a day (TID) | INTRAMUSCULAR | Status: DC
  Administered 2021-09-19 – 2021-09-20 (×2): 1 [IU] via SUBCUTANEOUS
  Administered 2021-09-23: 3 [IU] via SUBCUTANEOUS

## 2021-09-16 MED ORDER — METRONIDAZOLE 500 MG/100ML IV SOLN
500.0000 mg | Freq: Two times a day (BID) | INTRAVENOUS | Status: DC
  Administered 2021-09-17 – 2021-09-19 (×5): 500 mg via INTRAVENOUS
  Filled 2021-09-16 (×5): qty 100

## 2021-09-16 NOTE — H&P (Addendum)
History and Physical    Tammy Moses WFU:[id number] DOB: 12/19/1965 DOA: 09/16/2021  PCP: [name], FNP (Inactive) Consultants:  nephrology: Dr. Neta Ehlers  Patient coming from:  Home - lives alone   Chief Complaint: shortness of breath, cough, N/V  HPI: Tammy Moses is a 55 y.o. female with medical history significant of ESRD on HD MWF, T2DM, HTN, HLD, COPD/asthma who presented to ED for one week history of having shortness of breath and cough as well as nausea and vomiting. History is difficult as she is asleep and not really wanting to wake up and answer questions. She states the nausea and vomiting started yesterday and only had two episodes. She states her cough is dry in nature, denies any production/increased sputum. She states has felt tight, no increased wheezing. She isn't sure when she last went to dialysis, states last Monday or Tuesday maybe, told nephrology 11/16. She has a history of noncompliance. She has had chest pain with coughing per other notes in chart, but denies any chest pain with me. She is on 2-3L oxygen, but can not find any records of documented hypoxia. She had her oxygen off when I went into room. She has tried over the counter cough medication with no relief.   She denies any fever/chills at home. Denies any sick contacts.  She has a headache.  Denies any dizziness or lightheadedness, denies chest pain or palpitations, denies abdominal pain, denies dysuria urgency or frequency, denies any leg swelling.  She does smoke 1/3 pack per day. She drinks a little bit of alcohol, but not daily.   ED Course: vitals: temperature 101, blood pressure 186/72, heart rate 118, respiratory rate 20, oxygen 97% on room air Pertinent labs: WBC 11.6, hemoglobin 10.1, CO2 12, BUN 81, creatinine 15.59, anion gap 23, lactic acid 1.4, COVID and flu negative, troponin 94>pending, BNP 3461, urine negative nitrites negative leukocytes few bacteria.  Chest x-ray with no active  disease. In ED code sepsis activated and patient started on broad-spectrum antibiotics.  She was given 1 L LR bolus.  Nephrology consulted and [facility] was asked to admit.  Review of Systems: As per HPI; otherwise review of systems reviewed and negative.   Ambulatory Status:  Ambulates with walker and cane    Past Medical History:  Diagnosis Date   Anemia    Anxiety    Blind right eye    Chronic kidney disease, stage V (HCC)    Constipation, chronic    Dental caries    Diabetes mellitus    Diabetic neuropathy (HCC)    Diabetic retinopathy    GERD (gastroesophageal reflux disease)    Glaucoma    H. pylori infection    Hepatitis C carrier ([place])    High risk sexual behavior    Hyperlipidemia    Hypertension    Hypoglycemia 07/12/2017   Insomnia    Microalbuminuria    Nonspecific elevation of levels of transaminase or lactic acid dehydrogenase (LDH)    Tobacco dependence    Vitamin D deficiency     Past Surgical History:  Procedure Laterality Date   ABDOMINAL HYSTERECTOMY  04/30/2009   PARTIAL   COLONOSCOPY     DIALYSIS FISTULA CREATION Left 06/2017   ESOPHAGOGASTRODUODENOSCOPY      Social History   Socioeconomic History   Marital status: Divorced    Spouse name: Not on file   Number of children: 3   Years of education: 11   Highest education level: Not on file  Occupational History    Employer: DISABLED    Comment: Disabled  Tobacco Use   Smoking status: Every Day    Packs/day: 0.25    Types: Cigarettes   Smokeless tobacco: Never   Tobacco comments:    trying to quit  Vaping Use   Vaping Use: Never used  Substance and Sexual Activity   Alcohol use: Yes    Comment: 2-3 beers weekly   Drug use: Not Currently    Types: Marijuana, Cocaine    Comment: quit in 2018   Sexual activity: Yes  Other Topics Concern   Not on file  Social History Narrative   Patient lives at home alone.   Disabled.   Right handed.   Education 11 th grade.   Caffeine three cups  of coffee daily.   No psychiatrist   Social Determinants of Health   Financial Resource Strain: Not on file  Food Insecurity: Not on file  Transportation Needs: Not on file  Physical Activity: Not on file  Stress: Not on file  Social Connections: Not on file  Intimate Partner Violence: Not on file    Allergies  Allergen Reactions   Acyclovir And Related Itching    Family History  Problem Relation Age of Onset   High blood pressure Mother    Diabetes Mother    Thyroid disease Mother    Diabetes Father    High blood pressure Father    Cerebral palsy Daughter    Other Son        still born    Prior to Admission medications   Medication Sig Start Date End Date Taking? Authorizing Provider  albuterol (VENTOLIN HFA) 108 (90 Base) MCG/ACT inhaler Inhale 2 puffs into the lungs every 4 (four) hours as needed for shortness of breath. For shortness of breath 12/11/19  Yes [name], FNP  amLODipine (NORVASC) 10 MG tablet TAKE 1 TABLET(10 MG) BY MOUTH DAILY Patient taking differently: Take 10 mg by mouth daily. 02/10/21  Yes [name], FNP  atropine 1 % ophthalmic solution Place 1 drop into both eyes 2 (two) times daily.   Yes [provider]  buPROPion (ZYBAN) 150 MG 12 hr tablet Take 150 mg by mouth daily. 01/16/21  Yes [provider]  calcium acetate (PHOSLO) 667 MG capsule Take 2,001 mg by mouth 3 (three) times daily with meals. 07/06/18  Yes [provider]  diltiazem 2 % GEL Apply 1 application topically 5 (five) times daily. 06/19/21  Yes Irene Shipper, MD  dorzolamide-timolol (COSOPT) 22.3-6.8 MG/ML ophthalmic solution Place 1 drop into both eyes 2 (two) times daily. 12/08/20  Yes [provider]  furosemide (LASIX) 40 MG tablet Take 1 tablet (40 mg total) by mouth daily. 12/11/19  Yes [name], FNP  gabapentin (NEURONTIN) 600 MG tablet Take 1 tablet (600 mg total) by mouth 2 (two) times daily. 12/11/19  Yes [name], FNP  hydrALAZINE (APRESOLINE) 100 MG tablet Take 150 mg by mouth 2 (two) times daily. 05/19/20  Yes [provider]  lamoTRIgine (LAMICTAL) 25 MG tablet Take by mouth. 01/16/21  Yes [provider]  latanoprost (XALATAN) 0.005 % ophthalmic solution Place 1 drop into both eyes at bedtime. 12/16/20  Yes [provider]  pravastatin (PRAVACHOL) 80 MG tablet Take 1 tablet (80 mg total) by mouth daily. 12/11/19  Yes [name], FNP  timolol (TIMOPTIC) 0.5 % ophthalmic solution Place 1 drop into the left eye at bedtime.  05/23/17  Yes [provider]  Travoprost, BAK Free, (TRAVATAN) 0.004 % SOLN ophthalmic solution Place 1 drop into both eyes at bedtime.   Yes [provider]  blood glucose meter kit and supplies KIT Dispense based on patient and insurance preference. Use up to four times daily as directed. (FOR ICD-10 E.11.9) 11/30/17   Scot Jun, FNP  glucose blood (ONE TOUCH ULTRA TEST) test strip USE TO TEST 3 TIMES A DAY 08/15/20   [name], FNP  Insulin Pen Needle 29G X 12MM MISC E11.9 Dx Code Use once at bedtime with each nightly insulin 03/11/17   Scot Jun, FNP  Lancets [facility] ULTRASOFT) lancets Use as instructed 07/10/19   [name], FNP  LANTUS SOLOSTAR 100 UNIT/ML Solostar Pen ADMINISTER 20 UNITS UNDER THE SKIN DAILY AT 10 PM Patient not taking: Reported on 09/16/2021 02/04/21   [name], FNP    Physical Exam: Vitals:   09/16/21 1601 09/16/21 1700 09/16/21 1730 09/16/21 1955  BP: (!) 163/69 (!) 156/68 (!) 156/72 (!) 171/80  Pulse: (!) 109 (!) 103 (!) 105 97  Resp: (!) 21 16 (!) 21 20  Temp:    97.7 F (36.5 C)  TempSrc:    Oral  SpO2: 100% 95% 100% 93%  Weight:    58.9 kg  Height:    5' 1"  (1.549 m)     General:  Appears calm and comfortable and is in NAD. Sleeping. Will wake up and answer questions.  Eyes:  PERRL, EOMI, normal lids, iris. Right eye with cataract/blind.  ENT:  grossly  normal hearing, lips & tongue, mmm; poor dentition  Neck:  no LAD, masses or thyromegaly; no carotid bruits Cardiovascular:  RRR, no m/r/g. No LE edema.  Respiratory:   decreased breath sound bibasilar. Occasional expiratory wheeze. No crackles/rales.  Normal respiratory effort. Abdomen:  soft, NT, ND, NABS Back:   normal alignment, no CVAT Skin:  no rash or induration seen on limited exam. Left upper arm: AVF.  Musculoskeletal:  grossly normal tone BUE/BLE, good ROM, no bony abnormality Lower extremity:  No LE edema.  Limited foot exam with no ulcerations.  2+ distal pulses. Psychiatric:  grossly normal mood and affect, speech fluent and appropriate, AOx3 Neurologic:  CN 2-12 grossly intact, moves all extremities in coordinated fashion, sensation intact    Radiological Exams on Admission: Independently reviewed - see discussion in A/P where applicable  DG Chest Port 1 View  Result Date: 09/16/2021 CLINICAL DATA:  Sepsis. EXAM: PORTABLE CHEST 1 VIEW COMPARISON:  December 10, 2020. FINDINGS: The heart size and mediastinal contours are within normal limits. Both lungs are clear. The visualized skeletal structures are unremarkable. IMPRESSION: No active disease. Aortic Atherosclerosis (ICD10-I70.0). Electronically Signed   By: Marijo Conception M.D.   On: 09/16/2021 13:24    EKG: Independently reviewed.  Sinus tachycardia with rate 117; nonspecific ST changes with no evidence of acute ischemia   Labs on Admission: I have personally reviewed the available labs and imaging studies at the time of the admission.  Pertinent labs:  WBC 11.6,  hemoglobin 10.1,  CO2 12,  BUN 81,  creatinine 15.59,  anion gap 23,  lactic acid 1.4,  COVID and flu negative,  troponin 94>pending,  BNP 3461,  urine negative nitrites negative leukocytes few bacteria.  Assessment/Plan Principal Problem:   SIRS/sepsis  -55 year old female presenting with shortness of breath, cough, nausea and vomiting found to  meet SIRS/sepsis criteria with fever to 101,  tachycardia, and tachypnea of unknown source. -Interesting her chest x-ray is clear with no signs of pneumonia or volume overload. -Received broad-spectrum antibiotics and will continue this.  Check procalcitonin.  Tailor as indicated. -cultures pending  -lactic acid 1.4  -Respiratory viral panel pending -COVID and flu negative -COVID 1 L LR bolus in ED.  will hold IV fluids due to volume overload and need for dialysis.  Active Problems:   ESRD (end stage renal disease) ([place]) -Receives dialysis Monday Wednesday Friday, last session on 11/16. -Nephrology has seen with no emergent need for dialysis at this time. -they will follow clinically to see if emergent dialysis is needed tomorrow, but ideally would like to dialyze overnight on Thursday or Friday am   Hypoxia/? Respiratory failure with acute hypoxia  -Cannot find any documentation of hypoxia in chart.  4 L of oxygen was placed due to chest pain. -She was on no oxygen when I got into room and pulse ox around 93%, but jumped all around. Put her back on 2 L while in room.   -volume overload, ?infection contributing  -dialysis per nephrology, continue abx and respiratory viral panel pending  -Continue to wean as tolerated.    Essential hypertension -Took her a.m. meds today -Had 1 low reading others have been high per nephrology hold anti-hypertensive meds and follow.  Did not restart her Norvasc or hydralazine. -bp cuff on leg, doubt accurate readings.  -prn parameters written. Will likely need tomorrow in setting of missed HD sessions.     Increased anion gap metabolic acidosis -Likely secondary to worsening renal function and missed dialysis. -Lactate within normal limits  -continue to follow nephrology recommendation.    Diabetes mellitus ([place]) -Last A1c in October 2022 was 5.7 -She tells me she takes her Lantus daily however she told pharmacy tech that she has not been taking. -  will start her on a sliding scale and monitor may need long-acting insulin started back. -Accu-Cheks q. AC/HS.    Elevated troponin Initial troponin 94 with delta pending. Have called to have this repeated.  -ekg with no ST elevation and no active chest pain -likely secondary to demand ischemia/CKD  Elevated BNP with shortness of breath -no findings on exam to suggest CHF with no lower leg edema, no orthopnea -CXR with no signs of pulmonary edema or congestion -no baseline bnp in records  -volume control per dialysis -will check echo -intake/output and daily weights  -continue daily oral lasix     COPD (chronic obstructive pulmonary disease) (HCC) -occasional wheeze, moving air well.   no increased sputum production or change in sputum. Do not think in exacerbation at this time.  -already on broad spectrum antibiotics -schedule duonebs -albuterol prn  Anemia due to chronic kidney disease  -stable  Tobacco abuse Nicotine patch, encouraged cessation    Body mass index is 24.54 kg/m.    Level of care: Telemetry Medical DVT prophylaxis: heparin  Code Status:  Full - confirmed with patient Family Communication: None present Disposition Plan:  The patient is from: home  Anticipated d/c is to: home   Requires inpatient hospitalization and is at significant risk of worsening, requires constant monitoring, dialysis, IV medication and assessment and MDM with specialists.   Patient is currently: stable  Consults called: nephrology   Admission status:  inpatient   Dragon dictation used in completing this note.    Orma Flaming MD [facility]   How to contact the [facility] Attending or Consulting provider [place]  or covering provider during after hours [place], for this patient?  Check the care team in [facility] and look for a) attending/consulting [facility] provider listed and b) the [facility] team listed Log into [email or web address] and use [place]'s universal password to access. If you do  not have the password, please contact the hospital operator. Locate the [facility] provider you are looking for under [facility] and page to a number that you can be directly reached. If you still have difficulty reaching the provider, please page the [facility] (Director on Call) for the Hospitalists listed on amion for assistance.   09/16/2021, 8:09 PM

## 2021-09-16 NOTE — ED Notes (Signed)
All blood work collected

## 2021-09-16 NOTE — Sepsis Progress Note (Signed)
Notified provider of need to order antibiotics. Lactic acid 1.4. Blood cultures obtained. 518ml fluid bolus given.

## 2021-09-16 NOTE — ED Notes (Signed)
Unable to get 2nd set of BC. Will attempt after fluids.

## 2021-09-16 NOTE — Progress Notes (Signed)
Pharmacy Antibiotic Note  Tammy Moses is a 55 y.o. female admitted on 09/16/2021 with sepsis of unknown source. PMH ESRD on HD MWF. With the holiday schedule, next HD likely Friday 11/25 unless emergent need. Pharmacy has been consulted for vancomycin and cefepime dosing.  Plan: Cefepime 2g x1 load, then cefepime 1g q24h  Vancomycin 1000mg  x 1 load, then vancomycin 500mg  for post-HD dose F/u HD schedule and adjust with schedule changes  Height: 5\' 4"  (162.6 cm) Weight: 50.3 kg (111 lb) IBW/kg (Calculated) : 54.7  Temp (24hrs), Avg:101 F (38.3 C), Min:101 F (38.3 C), Max:101 F (38.3 C)  Recent Labs  Lab 09/16/21 1242  WBC 11.6*  CREATININE 15.59*  LATICACIDVEN 1.4    Estimated Creatinine Clearance: 3.2 mL/min (A) (by C-G formula based on SCr of 15.59 mg/dL (H)).    Allergies  Allergen Reactions   Acyclovir And Related Itching    Antimicrobials this admission: Cefepime 11/23 > Vancomycin 11/23 >  Dose adjustments this admission:  Microbiology results: 11/23 BCx: sent  Thank you for allowing pharmacy to participate in this patient's care.  Levonne Spiller, PharmD PGY1 Acute Care Resident  09/16/2021,3:27 PM

## 2021-09-16 NOTE — Consult Note (Signed)
Reason for Consult: To manage dialysis and dialysis related needs Referring Physician: Starleen Trussell is an 55 y.o. female with past medical history significant for diabetes mellitus, hypertension, hyperlipidemia, ESRD dialyzes at the [facility] we think Monday Wednesday Friday-  says Dr. Neta Ehlers.  She reports that she has been on dialysis for about 4 years.  She does not appear to be that compliant and has multiple trips to the emergency department.  Last dialysis was on 11/16 per her.  She came to the emergency department this morning with complaints of shortness of breath and cough-she also reports chest pain when she coughs.  She has been trying over-the-counter remedies without relief.  In the emergency room she is found to be hypoxic but currently oxygen saturation reasonable on 2 L.  There was 1 blood pressure that was low which set off sepsis protocol and she is received over a liter of lactated Ringer's now with a high blood pressure.  Chest x-ray was reading as no active disease.  We are asked to provide dialysis care.  Labs today also show potassium 4.7, bicarb 12, BUN of 81 and creatinine 15.6.  She is alert-asking when she can go home.  Does not reside in a facility, resides at home.  BNP is 3400 and troponin 94.  She says that she makes urine and usually does not have a big goal with dialysis.  I am unable to locate her orders in care anywhere or her attendance record  Dialyzes at [facility]-  MWF but non compliant  other orders unknown  Access left AVF.  Past Medical History:  Diagnosis Date   Anemia    Anxiety    Blind right eye    Chronic kidney disease, stage V (HCC)    Constipation, chronic    Dental caries    Diabetes mellitus    Diabetic neuropathy (HCC)    Diabetic retinopathy    GERD (gastroesophageal reflux disease)    Glaucoma    H. pylori infection    Hepatitis C carrier ([place])    High risk sexual behavior    Hyperlipidemia    Hypertension    Hypoglycemia  07/12/2017   Insomnia    Microalbuminuria    Nonspecific elevation of levels of transaminase or lactic acid dehydrogenase (LDH)    Tobacco dependence    Vitamin D deficiency     Past Surgical History:  Procedure Laterality Date   ABDOMINAL HYSTERECTOMY  04/30/2009   PARTIAL   COLONOSCOPY     DIALYSIS FISTULA CREATION Left 06/2017   ESOPHAGOGASTRODUODENOSCOPY      Family History  Problem Relation Age of Onset   High blood pressure Mother    Diabetes Mother    Thyroid disease Mother    Diabetes Father    High blood pressure Father    Cerebral palsy Daughter    Other Son        still born    Social History:  reports that she has been smoking cigarettes. She has been smoking an average of .25 packs per day. She has never used smokeless tobacco. She reports current alcohol use. She reports that she does not currently use drugs after having used the following drugs: Marijuana and Cocaine.  Allergies:  Allergies  Allergen Reactions   Acyclovir And Related Itching    Medications: I have reviewed the patient's current medications.  Results for orders placed or performed during the hospital encounter of 09/16/21 (from the past 48 hour(s))  Lactic acid, plasma     Status: None   Collection Time: 09/16/21 12:42 PM  Result Value Ref Range   Lactic Acid, Venous 1.4 0.5 - 1.9 mmol/L    Comment: Performed at [facility], [place] [address], [place], [place] [address]  Comprehensive metabolic panel     Status: Abnormal   Collection Time: 09/16/21 12:42 PM  Result Value Ref Range   Sodium 134 (L) 135 - 145 mmol/L   Potassium 4.7 3.5 - 5.1 mmol/L   Chloride 99 98 - 111 mmol/L   CO2 12 (L) 22 - 32 mmol/L   Glucose, Bld 143 (H) 70 - 99 mg/dL    Comment: Glucose reference range applies only to samples taken after fasting for at least 8 hours.   BUN 81 (H) 6 - 20 mg/dL   Creatinine, Ser 15.59 (H) 0.44 - 1.00 mg/dL   Calcium 8.2 (L) 8.9 - 10.3 mg/dL   Total Protein 7.7 6.5 - 8.1  g/dL   Albumin 3.1 (L) 3.5 - 5.0 g/dL   AST 13 (L) 15 - 41 U/L   ALT 10 0 - 44 U/L   Alkaline Phosphatase 62 38 - 126 U/L   Total Bilirubin 0.6 0.3 - 1.2 mg/dL   GFR, Estimated 2 (L) >60 mL/min    Comment: (NOTE) Calculated using the CKD-EPI Creatinine Equation (2021)    Anion gap 23 (H) 5 - 15    Comment: RESULT REPEATED AND VERIFIED Performed at [facility], [place] [address] [place], [place] [address]   CBC WITH DIFFERENTIAL     Status: Abnormal   Collection Time: 09/16/21 12:42 PM  Result Value Ref Range   WBC 11.6 (H) 4.0 - 10.5 K/uL   RBC 3.48 (L) 3.87 - 5.11 MIL/uL   Hemoglobin 10.1 (L) 12.0 - 15.0 g/dL   HCT 31.9 (L) 36.0 - 46.0 %   MCV 91.7 80.0 - 100.0 fL   MCH 29.0 26.0 - 34.0 pg   MCHC 31.7 30.0 - 36.0 g/dL   RDW 15.8 (H) 11.5 - 15.5 %   Platelets 354 150 - 400 K/uL   nRBC 0.0 0.0 - 0.2 %   Neutrophils Relative % 85 %   Neutro Abs 9.9 (H) 1.7 - 7.7 K/uL   Lymphocytes Relative 3 %   Lymphs Abs 0.4 (L) 0.7 - 4.0 K/uL   Monocytes Relative 10 %   Monocytes Absolute 1.1 (H) 0.1 - 1.0 K/uL   Eosinophils Relative 0 %   Eosinophils Absolute 0.0 0.0 - 0.5 K/uL   Basophils Relative 1 %   Basophils Absolute 0.1 0.0 - 0.1 K/uL   Immature Granulocytes 1 %   Abs Immature Granulocytes 0.06 0.00 - 0.07 K/uL    Comment: Performed at [facility], [place] [address], [place], [place] [address]  Protime-INR     Status: None   Collection Time: 09/16/21 12:42 PM  Result Value Ref Range   Prothrombin Time 14.9 11.4 - 15.2 seconds   INR 1.2 0.8 - 1.2    Comment: (NOTE) INR goal varies based on device and disease states. Performed at [facility], [place] [address] [place], [place] [address]   APTT     Status: None   Collection Time: 09/16/21 12:42 PM  Result Value Ref Range   aPTT 28 24 - 36 seconds    Comment: Performed at [facility] [address], [place], [place] [address]  I-Stat beta hCG blood,  ED     Status: Abnormal   Collection Time: 09/16/21  12:56 PM  Result Value Ref Range   I-stat hCG, quantitative 6.4 (H) <5 mIU/mL   Comment 3            Comment:   GEST. AGE      CONC.  (mIU/mL)   <=1 WEEK        5 - 50     2 WEEKS       50 - 500     3 WEEKS       100 - 10,000     4 WEEKS     1,000 - 30,000        FEMALE AND NON-PREGNANT FEMALE:     LESS THAN 5 mIU/mL   Resp Panel by RT-PCR (Flu A&B, Covid) Nasopharyngeal Swab     Status: None   Collection Time: 09/16/21  1:17 PM   Specimen: Nasopharyngeal Swab; Nasopharyngeal(NP) swabs in vial transport medium  Result Value Ref Range   SARS Coronavirus 2 by RT PCR NEGATIVE NEGATIVE    Comment: (NOTE) SARS-CoV-2 target nucleic acids are NOT DETECTED.  The SARS-CoV-2 RNA is generally detectable in upper respiratory specimens during the acute phase of infection. The lowest concentration of SARS-CoV-2 viral copies this assay can detect is 138 copies/mL. A negative result does not preclude SARS-Cov-2 infection and should not be used as the sole basis for treatment or other patient management decisions. A negative result may occur with  improper specimen collection/handling, submission of specimen other than nasopharyngeal swab, presence of viral mutation(s) within the areas targeted by this assay, and inadequate number of viral copies(<138 copies/mL). A negative result must be combined with clinical observations, patient history, and epidemiological information. The expected result is Negative.  Fact Sheet for Patients:  EntrepreneurPulse.com.au  Fact Sheet for Healthcare Providers:  IncredibleEmployment.be  This test is no t yet approved or cleared by the [place] FDA and  has been authorized for detection and/or diagnosis of SARS-CoV-2 by FDA under an Emergency Use Authorization (EUA). This EUA will remain  in effect (meaning this test can be used) for the duration of the COVID-19 declaration under Section 564(b)(1) of the Act,  21 U.S.C.section 360bbb-3(b)(1), unless the authorization is terminated  or revoked sooner.       Influenza A by PCR NEGATIVE NEGATIVE   Influenza B by PCR NEGATIVE NEGATIVE    Comment: (NOTE) The Xpert Xpress SARS-CoV-2/FLU/RSV plus assay is intended as an aid in the diagnosis of influenza from Nasopharyngeal swab specimens and should not be used as a sole basis for treatment. Nasal washings and aspirates are unacceptable for Xpert Xpress SARS-CoV-2/FLU/RSV testing.  Fact Sheet for Patients: EntrepreneurPulse.com.au  Fact Sheet for Healthcare Providers: IncredibleEmployment.be  This test is not yet approved or cleared by the [place] FDA and has been authorized for detection and/or diagnosis of SARS-CoV-2 by FDA under an Emergency Use Authorization (EUA). This EUA will remain in effect (meaning this test can be used) for the duration of the COVID-19 declaration under Section 564(b)(1) of the Act, 21 U.S.C. section 360bbb-3(b)(1), unless the authorization is terminated or revoked.  Performed at [facility], [place] [address] [place], [place] [address]   Troponin I (High Sensitivity)     Status: Abnormal   Collection Time: 09/16/21  2:05 PM  Result Value Ref Range   Troponin I (High Sensitivity) 94 (H) <18 ng/L    Comment: (NOTE) Elevated high sensitivity troponin I (  hsTnI) values and significant  changes across serial measurements may suggest ACS but many other  chronic and acute conditions are known to elevate hsTnI results.  Refer to the "Links" section for chest pain algorithms and additional  guidance. Performed at [facility], [place] [address], [place], [place] [address]   Brain natriuretic peptide     Status: Abnormal   Collection Time: 09/16/21  2:05 PM  Result Value Ref Range   B Natriuretic Peptide 3,461.4 (H) 0.0 - 100.0 pg/mL    Comment: Performed at [facility] [address] [place], [place]  [address]  Urinalysis, Routine w reflex microscopic     Status: Abnormal   Collection Time: 09/16/21  3:11 PM  Result Value Ref Range   Color, Urine YELLOW YELLOW   APPearance HAZY (A) CLEAR   Specific Gravity, Urine 1.013 1.005 - 1.030   pH 5.0 5.0 - 8.0   Glucose, UA >=500 (A) NEGATIVE mg/dL   Hgb urine dipstick SMALL (A) NEGATIVE   Bilirubin Urine NEGATIVE NEGATIVE   Ketones, ur 5 (A) NEGATIVE mg/dL   Protein, ur 100 (A) NEGATIVE mg/dL   Nitrite NEGATIVE NEGATIVE   Leukocytes,Ua NEGATIVE NEGATIVE   RBC / HPF 0-5 0 - 5 RBC/hpf   WBC, UA 0-5 0 - 5 WBC/hpf   Bacteria, UA FEW (A) NONE SEEN   Squamous Epithelial / LPF 0-5 0 - 5   Mucus PRESENT     Comment: Performed at [facility], [place] [address], [place], [place] [address]    DG Chest Port 1 View  Result Date: 09/16/2021 CLINICAL DATA:  Sepsis. EXAM: PORTABLE CHEST 1 VIEW COMPARISON:  December 10, 2020. FINDINGS: The heart size and mediastinal contours are within normal limits. Both lungs are clear. The visualized skeletal structures are unremarkable. IMPRESSION: No active disease. Aortic Atherosclerosis (ICD10-I70.0). Electronically Signed   By: Marijo Conception M.D.   On: 09/16/2021 13:24    ROS: cough, SOB-  chest pain  Blood pressure (!) 163/69, pulse (!) 109, temperature (!) 101 F (38.3 C), temperature source Oral, resp. rate (!) 21, height 5\' 4"  (1.626 m), weight 50.3 kg, SpO2 100 %. General appearance: slowed mentation and somnolent Resp: diminished breath sounds bibasilar Cardio: regular rate and rhythm, S1, S2 normal, no murmur, click, rub or gallop GI: soft, non-tender; bowel sounds normal; no masses,  no organomegaly Extremities: extremities normal, atraumatic, no cyanosis or edema Left upper arm AVF-  patent  Assessment/Plan: 55 year old non compliant HD patient last HD 11/16-  presents with sob, cough, hypoxia, elevated BNP and troponin  1 hypoxia - interesting that CXR is clear- has been coughing and SOB-  flu  and covid negative-  has fever and wbc so is being treated with abx for possible PNA.  She was 100% sat on 3 liters-  I turned her down to 2-  she does not have peripheral edema  so not sure she is overloaded but will stop her high dose IVF.  The HD nurses are maxed out on treatments overnight and only doing emergencies tomorrow so will watch and if we feel is overloaded I think she could respond to lasix since makes urine 2 ESRD: has assignment MWF at [facility] unit-  is not compliant it seems-  last HD was 11/16.  Labs do not give compelling argument for HD tonight-  will need to re evaluate in AM and see if she will need emergently tomorrow but ideally  would like to wait until overnight on Thursday or Friday AM-  her clinical picture will dictate 3 Hypertension: had one BP that was low-  others have been high-  hold antihypertensives and follow-  do not think she needs high volume IVF if is thought she is overloaded  4. Anemia of ESRD: not significant at this time-  no treatment needed 5. Metabolic Bone Disease: calcium wnl-  no phos yet- see that she Is on phoslo as OP-  no immediate need to resume right now 6. Metabolic acidosis-  bicarb 12-  probably from missed HD-  lactate not terrible -  got quite a bit of LR already   [employer] 09/16/2021, 4:19 PM

## 2021-09-16 NOTE — ED Notes (Signed)
ED Provider at bedside. 

## 2021-09-16 NOTE — ED Triage Notes (Addendum)
Pt BIB EMS due to right sided chest pain and SOB. Pt states chest pain got worse on palpation. MWF dialysis. Pt missed Friday and today dialysis. Pt started having diarrhea a week ago,2 days ago cough, vomiting and febrile. Pt is axox4. Pt is tachy and hypertensive on arrival. Pt received 2 duonebs 10 of albuterol and 0.5 of atrovent.

## 2021-09-16 NOTE — ED Provider Notes (Signed)
[facility] EMERGENCY DEPARTMENT Provider Note   CSN: [id number] Arrival date & time: 09/16/21  1133     History Chief Complaint  Patient presents with   Chest Pain    Tammy Moses is a 55 y.o. female.  HPI  55 year old female with past medical history of ESRD on HD every MWF, DM, HTN, HLD presents the emergency department with illness.  Patient states over the past week she has had days where she has been too ill to go to dialysis, specifically last Friday and today.  For the past week she has been having an intermittent productive cough, nausea/vomiting/diarrhea and fevers at home.  States that she is been trying over-the-counter medication without any relief.  Called EMS when she started to feel short of breath, received a breathing treatment in route with slight improvement. Denies CP and abdominal pain.   Past Medical History:  Diagnosis Date   Anemia    Anxiety    Blind right eye    Chronic kidney disease, stage V (HCC)    Constipation, chronic    Dental caries    Diabetes mellitus    Diabetic neuropathy ([place])    Diabetic retinopathy    GERD (gastroesophageal reflux disease)    Glaucoma    H. pylori infection    Hepatitis C carrier ([place])    High risk sexual behavior    Hyperlipidemia    Hypertension    Hypoglycemia 07/12/2017   Insomnia    Microalbuminuria    Nonspecific elevation of levels of transaminase or lactic acid dehydrogenase (LDH)    Tobacco dependence    Vitamin D deficiency     Patient Active Problem List   Diagnosis Date Noted   Impaired functional mobility and activity tolerance 01/04/2019   Acute renal failure superimposed on stage 5 chronic kidney disease, not on chronic dialysis ([place]) 10/05/2017   Pneumonia of both lungs due to infectious organism 10/05/2017   Fall    Hypoglycemia 07/12/2017   CKD (chronic kidney disease) stage 5, GFR less than 15 ml/min (HCC) 07/12/2017   Anemia 28/20/6015   Alcoholic intoxication  without complication (HCC)    COPD (chronic obstructive pulmonary disease) ([place]) 06/24/2017   ESRD (end stage renal disease) on dialysis ([place]) 06/17/2017   Foot drop 05/26/2017   Carpal tunnel syndrome of left wrist 05/20/2017   Ulnar neuropathy of left upper extremity 05/20/2017   Poor compliance with medication 05/17/2017   Peripheral neuropathy 05/10/2017   Diabetes mellitus ([place]) 05/10/2017   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   Vitamin D deficiency 04/07/2017   Absolute glaucoma of right eye 12/27/2016   Chronic angle-closure glaucoma of left eye, severe stage 12/27/2016   PCO (posterior capsule opacification), left 12/27/2016   Gait abnormality 03/07/2014   Abnormality of gait 03/07/2014   Hepatitis C virus infection without hepatic coma 03/04/2014   History of hepatitis C 03/04/2014   Erosive gastritis 02/14/2014   Pseudophakia of left eye 10/30/2012   Asthma 10/13/2012   Reflux 10/13/2012   Lens replaced by other means 10/12/2012   Primary open angle glaucoma 10/12/2012   Encounter for screening for diabetes mellitus 10/09/2012   Malignant hypertension with chronic kidney disease stage V ([place]) 10/09/2012   BACK PAIN, LUMBAR 08/18/2010   IDDM 08/11/2010   HYPERCHOLESTEROLEMIA 08/11/2010   HYPOKALEMIA 08/11/2010   DEPRESSION 08/11/2010   GLAUCOMA 08/11/2010   BLINDNESS, RIGHT EYE 08/11/2010   Essential hypertension 08/11/2010   GERD 08/11/2010   CONSTIPATION 08/06/2009  Past Surgical History:  Procedure Laterality Date   ABDOMINAL HYSTERECTOMY  04/30/2009   PARTIAL   COLONOSCOPY     DIALYSIS FISTULA CREATION Left 06/2017   ESOPHAGOGASTRODUODENOSCOPY       OB History   No obstetric history on file.     Family History  Problem Relation Age of Onset   High blood pressure Mother    Diabetes Mother    Thyroid disease Mother    Diabetes Father    High blood pressure Father    Cerebral palsy Daughter    Other Son        still born    Social History    Tobacco Use   Smoking status: Every Day    Packs/day: 0.25    Types: Cigarettes   Smokeless tobacco: Never   Tobacco comments:    trying to quit  Vaping Use   Vaping Use: Never used  Substance Use Topics   Alcohol use: Yes    Comment: 2-3 beers weekly   Drug use: Not Currently    Types: Marijuana, Cocaine    Comment: quit in 2018    Home Medications Prior to Admission medications   Medication Sig Start Date End Date Taking? Authorizing Provider  albuterol (VENTOLIN HFA) 108 (90 Base) MCG/ACT inhaler Inhale 2 puffs into the lungs every 4 (four) hours as needed for shortness of breath. For shortness of breath 12/11/19   [name], FNP  amLODipine (NORVASC) 10 MG tablet TAKE 1 TABLET(10 MG) BY MOUTH DAILY 02/10/21   [name], FNP  atropine 1 % ophthalmic solution Place 1 drop into both eyes 2 (two) times daily.    [provider]  blood glucose meter kit and supplies KIT Dispense based on patient and insurance preference. Use up to four times daily as directed. (FOR ICD-10 E.11.9) 11/30/17   Scot Jun, FNP  buPROPion (ZYBAN) 150 MG 12 hr tablet Take 150 mg by mouth every morning. 01/16/21   [provider]  calcium acetate (PHOSLO) 667 MG capsule Take 2,001 mg by mouth 3 (three) times daily with meals. 07/06/18   [provider]  diltiazem 2 % GEL Apply 1 application topically 5 (five) times daily. 06/19/21   Irene Shipper, MD  dorzolamide-timolol (COSOPT) 22.3-6.8 MG/ML ophthalmic solution 1 drop 2 (two) times daily. 12/08/20   [provider]  furosemide (LASIX) 40 MG tablet Take 1 tablet (40 mg total) by mouth daily. 12/11/19   [name], FNP  gabapentin (NEURONTIN) 600 MG tablet Take 1 tablet (600 mg total) by mouth 2 (two) times daily. 12/11/19   [name], FNP  glucose blood (ONE TOUCH ULTRA TEST) test strip USE TO TEST 3 TIMES A DAY 08/15/20   [name], FNP  hydrALAZINE (APRESOLINE) 100 MG tablet Take  150 mg by mouth 2 (two) times daily. 05/19/20   [provider]  Insulin Pen Needle 29G X 12MM MISC E11.9 Dx Code Use once at bedtime with each nightly insulin 03/11/17   Scot Jun, FNP  lamoTRIgine (LAMICTAL) 25 MG tablet Take by mouth. 01/16/21   [provider]  Lancets [facility] ULTRASOFT) lancets Use as instructed 07/10/19   [name], FNP  LANTUS SOLOSTAR 100 UNIT/ML Solostar Pen ADMINISTER 20 UNITS UNDER THE SKIN DAILY AT 10 PM 02/04/21   [name], FNP  latanoprost (XALATAN) 0.005 % ophthalmic solution SMARTSIG:1 Drop(s) In Eye(s) Every Evening 12/16/20   [provider]  pravastatin (  PRAVACHOL) 80 MG tablet Take 1 tablet (80 mg total) by mouth daily. 12/11/19   [name], FNP  timolol (TIMOPTIC) 0.5 % ophthalmic solution Place 1 drop into the left eye at bedtime.  05/23/17   [provider]  Travoprost, BAK Free, (TRAVATAN) 0.004 % SOLN ophthalmic solution Place 1 drop into both eyes at bedtime.    [provider]    Allergies    Acyclovir and related  Review of Systems   Review of Systems  Constitutional:  Positive for appetite change, chills, fatigue and fever.  HENT:  Negative for congestion.   Eyes:  Negative for visual disturbance.  Respiratory:  Positive for cough and shortness of breath.   Cardiovascular:  Negative for chest pain and leg swelling.  Gastrointestinal:  Positive for diarrhea, nausea and vomiting. Negative for abdominal pain.  Genitourinary:  Positive for decreased urine volume. Negative for dysuria and flank pain.  Musculoskeletal:  Negative for neck pain.  Skin:  Negative for rash.  Neurological:  Negative for headaches.   Physical Exam Updated Vital Signs Ht 5' 4"  (1.626 m)   Wt 50.3 kg   BMI 19.05 kg/m   Physical Exam  ED Results / Procedures / Treatments   Labs (all labs ordered are listed, but only abnormal results are displayed) Labs Reviewed  RESP PANEL BY RT-PCR (FLU  A&B, COVID) ARPGX2  CULTURE, BLOOD (ROUTINE X 2)  CULTURE, BLOOD (ROUTINE X 2)  LACTIC ACID, PLASMA  LACTIC ACID, PLASMA  COMPREHENSIVE METABOLIC PANEL  CBC WITH DIFFERENTIAL/PLATELET  PROTIME-INR  APTT  URINALYSIS, ROUTINE W REFLEX MICROSCOPIC  I-STAT BETA HCG BLOOD, ED ([facility], [facility], [facility] ONLY)    EKG None  Radiology No results found.  Procedures .Critical Care Performed by: Lorelle Gibbs, DO Authorized by: Lorelle Gibbs, DO   Critical care provider statement:    Critical care time (minutes):  45   Critical care time was exclusive of:  Separately billable procedures and treating other patients   Critical care was necessary to treat or prevent imminent or life-threatening deterioration of the following conditions:  Sepsis   Critical care was time spent personally by me on the following activities:  Development of treatment plan with patient or surrogate, discussions with consultants, evaluation of patient's response to treatment, examination of patient, ordering and review of laboratory studies, ordering and review of radiographic studies, ordering and performing treatments and interventions, pulse oximetry, re-evaluation of patient's condition and review of old charts   I assumed direction of critical care for this patient from another provider in my specialty: no     Care discussed with: admitting provider     Medications Ordered in ED Medications  lactated ringers bolus 500 mL (has no administration in time range)    ED Course  I have reviewed the triage vital signs and the nursing notes.  Pertinent labs & imaging results that were available during my care of the patient were reviewed by me and considered in my medical decision making (see chart for details).    MDM Rules/Calculators/A&P                           55 year old female presents emergency department concern for infection.  She has end-stage renal disease, hemodialysis, has missed 2 sessions secondary to  generalized illness.  States that she has had cough and diarrhea for the past 2 weeks.  Currently she has no complaint of chest pain or  abdominal pain.  She has slight rales bilaterally on lung auscultation, benign abdomen.  Blood work shows a mild leukocytosis of 11.6, worsening kidney dysfunction.  Lactic is normal.  Troponin and BNP are elevated, appears to be a component of CHF.  Although the chest x-ray shows no pneumonia or signs of acute pulmonary edema.  Patient was tachycardic on arrival, received small amount of fluid secondary to fluid status.  Was given Tylenol for fever and will reevaluate.  Urinalysis shows no infection, flu and COVID is negative.  SIRS criteria met with potential sepsis, will treat broadly with antibiotics as well as address fluid status with dialysis and possible treatment of CHF.  Will notify nephrology.  Patients evaluation and results requires admission for further treatment and care. Patient agrees with admission plan, offers no new complaints and is stable/unchanged at time of admit.  Final Clinical Impression(s) / ED Diagnoses Final diagnoses:  None    Rx / DC Orders ED Discharge Orders     None        Lorelle Gibbs, DO 09/16/21 1608

## 2021-09-16 NOTE — Sepsis Progress Note (Signed)
Code sepsis protocol being monitored by eLink. 

## 2021-09-16 NOTE — ED Notes (Signed)
EDP notified of chest pain. 4L of oxygen initiated.

## 2021-09-17 DIAGNOSIS — A419 Sepsis, unspecified organism: Secondary | ICD-10-CM | POA: Diagnosis not present

## 2021-09-17 DIAGNOSIS — J449 Chronic obstructive pulmonary disease, unspecified: Secondary | ICD-10-CM | POA: Diagnosis not present

## 2021-09-17 DIAGNOSIS — D631 Anemia in chronic kidney disease: Secondary | ICD-10-CM

## 2021-09-17 DIAGNOSIS — N184 Chronic kidney disease, stage 4 (severe): Secondary | ICD-10-CM

## 2021-09-17 DIAGNOSIS — R778 Other specified abnormalities of plasma proteins: Secondary | ICD-10-CM

## 2021-09-17 DIAGNOSIS — N186 End stage renal disease: Secondary | ICD-10-CM

## 2021-09-17 DIAGNOSIS — Z992 Dependence on renal dialysis: Secondary | ICD-10-CM

## 2021-09-17 DIAGNOSIS — E8729 Other acidosis: Secondary | ICD-10-CM

## 2021-09-17 DIAGNOSIS — E1122 Type 2 diabetes mellitus with diabetic chronic kidney disease: Secondary | ICD-10-CM

## 2021-09-17 DIAGNOSIS — I10 Essential (primary) hypertension: Secondary | ICD-10-CM

## 2021-09-17 LAB — RENAL FUNCTION PANEL
Albumin: 2.8 g/dL — ABNORMAL LOW (ref 3.5–5.0)
Anion gap: 20 — ABNORMAL HIGH (ref 5–15)
BUN: 87 mg/dL — ABNORMAL HIGH (ref 6–20)
CO2: 13 mmol/L — ABNORMAL LOW (ref 22–32)
Calcium: 8.4 mg/dL — ABNORMAL LOW (ref 8.9–10.3)
Chloride: 101 mmol/L (ref 98–111)
Creatinine, Ser: 15.61 mg/dL — ABNORMAL HIGH (ref 0.44–1.00)
GFR, Estimated: 2 mL/min — ABNORMAL LOW (ref >60)
Glucose, Bld: 104 mg/dL — ABNORMAL HIGH (ref 70–99)
Phosphorus: 11.5 mg/dL — ABNORMAL HIGH (ref 2.5–4.6)
Potassium: 5.2 mmol/L — ABNORMAL HIGH (ref 3.5–5.1)
Sodium: 134 mmol/L — ABNORMAL LOW (ref 135–145)

## 2021-09-17 LAB — RESPIRATORY PANEL BY PCR

## 2021-09-17 LAB — CBC
HCT: 32.5 % — ABNORMAL LOW (ref 36.0–46.0)
Hemoglobin: 10.4 g/dL — ABNORMAL LOW (ref 12.0–15.0)
MCH: 29.1 pg (ref 26.0–34.0)
MCHC: 32 g/dL (ref 30.0–36.0)
MCV: 91 fL (ref 80.0–100.0)
Platelets: 332 10*3/uL (ref 150–400)
RBC: 3.57 MIL/uL — ABNORMAL LOW (ref 3.87–5.11)
RDW: 15.8 % — ABNORMAL HIGH (ref 11.5–15.5)
WBC: 11.7 10*3/uL — ABNORMAL HIGH (ref 4.0–10.5)
nRBC: 0 % (ref 0.0–0.2)

## 2021-09-17 LAB — PROCALCITONIN: Procalcitonin: 4.04 ng/mL

## 2021-09-17 LAB — GLUCOSE, CAPILLARY
Glucose-Capillary: 121 mg/dL — ABNORMAL HIGH (ref 70–99)
Glucose-Capillary: 108 mg/dL — ABNORMAL HIGH (ref 70–99)
Glucose-Capillary: 112 mg/dL — ABNORMAL HIGH (ref 70–99)
Glucose-Capillary: 124 mg/dL — ABNORMAL HIGH (ref 70–99)

## 2021-09-17 LAB — RAPID URINE DRUG SCREEN, HOSP PERFORMED
Amphetamines: NOT DETECTED
Barbiturates: NOT DETECTED
Benzodiazepines: NOT DETECTED
Cocaine: NOT DETECTED
Opiates: NOT DETECTED
Tetrahydrocannabinol: POSITIVE — AB

## 2021-09-17 LAB — HIV ANTIBODY (ROUTINE TESTING W REFLEX): HIV Screen 4th Generation wRfx: NONREACTIVE

## 2021-09-17 LAB — HEPATITIS B SURFACE ANTIBODY,QUALITATIVE: Hep B S Ab: REACTIVE — AB

## 2021-09-17 LAB — HEPATITIS B SURFACE ANTIGEN: Hepatitis B Surface Ag: NONREACTIVE

## 2021-09-17 MED ORDER — CHLORHEXIDINE GLUCONATE CLOTH 2 % EX PADS
6.0000 | MEDICATED_PAD | Freq: Every day | CUTANEOUS | Status: DC
  Administered 2021-09-17: 6 via TOPICAL

## 2021-09-17 MED ORDER — HYDRALAZINE HCL 50 MG PO TABS
150.0000 mg | ORAL_TABLET | Freq: Two times a day (BID) | ORAL | Status: DC
  Administered 2021-09-17 – 2021-09-24 (×13): 150 mg via ORAL
  Filled 2021-09-17 (×13): qty 3

## 2021-09-17 MED ORDER — CHLORHEXIDINE GLUCONATE CLOTH 2 % EX PADS
6.0000 | MEDICATED_PAD | Freq: Every day | CUTANEOUS | Status: DC
  Administered 2021-09-17 – 2021-09-20 (×4): 6 via TOPICAL

## 2021-09-17 MED ORDER — AMLODIPINE BESYLATE 10 MG PO TABS
10.0000 mg | ORAL_TABLET | Freq: Every day | ORAL | Status: DC
  Administered 2021-09-17 – 2021-09-24 (×7): 10 mg via ORAL
  Filled 2021-09-17 (×8): qty 1

## 2021-09-17 NOTE — Plan of Care (Signed)

## 2021-09-17 NOTE — Progress Notes (Signed)
Patient is blind, lives alone in a private residence with a helpful neighbor who helps out with some simple tasks. Patient has a home health aide who visits from 3 am to 6 am to do some household chores such as washing dishes and tidying up the house. The patient sometimes have a hard time getting her medicine and the neighbor helps to get food for the patient whish is usually processed food. RN noted that patient uses a cane to try to walk, but unable to put pressure on the right foot stating that it is painful because of neuropathy. It is concerning that the patient lives alone without family support as her son apparently passed away few years ago. This patient is definitely high risk for readmission and may need help with placement. RN encouraged the patient to talk to the attending and case management.

## 2021-09-17 NOTE — Progress Notes (Signed)
PROGRESS NOTE  Tammy Moses HQP:[id number] DOB: 1966/10/20 DOA: 09/16/2021 PCP: [name], FNP (Inactive)   LOS: 1 day   Brief narrative:  Tammy Moses is a 55 y.o. female with past medical history of end-stage renal disease on hemodialysis Monday Wednesday Friday, type 2 diabetes mellitus, hypertension, hyperlipidemia, COPD and asthma presented to hospital with 1 week history of shortness of breath and cough with nausea and vomiting.  States that she is on oxygen at home at baseline and smokes 4 pack a day.  In the ED initial temperature was 101 F.  Initial creatinine was 15.59, anion gap 23, lactic acid 1.4, COVID and flu negative, troponin 94>pending, BNP 3461, urine analysis was negative for nitrites negative leukocytes few bacteria.  Chest x-ray did not show any acute infiltrate.  In the ED code sepsis was activated, patient was given 1 L of [place] lactate bolus, IV antibiotic and was admitted to hospital with nephrology consultation  Assessment/Plan:  Principal Problem:   Sepsis ([place]) Active Problems:   Essential hypertension   Diabetes mellitus ([place])   COPD (chronic obstructive pulmonary disease) (HCC)   ESRD (end stage renal disease) (HCC)   Increased anion gap metabolic acidosis   Elevated troponin   Anemia due to chronic kidney disease  SIRS/sepsis  Patient with cough shortness of breath and fever.  Chest x-ray did not show obvious infiltrate.  HIV was nonreactive.  Leukocytosis present at 11.7.  Procalcitonin elevated at 4.04.  Urinalysis showed negative nitrites.  COVID and influenza was negative.  Follow blood cultures.  On vancomycin and cefepime.  ESRD (end stage renal disease) with metabolic acidosis. On Monday Wednesday Friday schedule.  Nephrology has been consulted.  Acute respiratory failure with  hypoxia  On 3 L of oxygen by nasal cannula.     Essential hypertension Blood pressure is starting to be high.  Will resume Norvasc or hydralazine.      Diabetes mellitus type II. Latest hemoglobin A1c of 5.7 from October 2022.  Continue sliding scale insulin.  She was supposed to be on long-acting but is not taking.  Diabetic diet.    Elevated troponin Initial troponin 94 followed by 210.  EKG without acute changes and no chest pain.  We will continue to monitor closely.  Could be secondary to demand ischemia.  Check 2D echocardiogram for wall motion abnormality   Elevated BNP with shortness of breath Patient had chest x-ray without any infiltrate.  Continue volume control with dialysis.  Intake and output charting.     COPD (chronic obstructive pulmonary disease)  On IV antibiotic bronchodilators for COPD.   Anemia due to chronic kidney disease  -stable   Tobacco abuse Nicotine patch, counseled about quitting  DVT prophylaxis: heparin injection 5,000 Units Start: 09/16/21 2200   Code Status: Full code  Family Communication: Spoke with the patient at bedside  Status is: Inpatient  Remains inpatient appropriate because: Hypoxic respiratory failure, possible sepsis, hemodialysis patient,    Consultants: Nephrology  Procedures: None  Anti-infectives:  Vancomycin and cefepime  Anti-infectives (From admission, onward)    Start     Dose/Rate Route Frequency Ordered Stop   09/18/21 1200  vancomycin (VANCOREADY) IVPB 750 mg/150 mL  Status:  Discontinued        750 mg 150 mL/hr over 60 Minutes Intravenous Every M-W-F (Hemodialysis) 09/16/21 1520 09/16/21 1522   09/18/21 1200  vancomycin (VANCOREADY) IVPB 500 mg/100 mL        500 mg 100 mL/hr over  60 Minutes Intravenous Every M-W-F (Hemodialysis) 09/16/21 1522     09/17/21 2200  ceFEPIme (MAXIPIME) 1 g in sodium chloride 0.9 % 100 mL IVPB        1 g 200 mL/hr over 30 Minutes Intravenous Daily at bedtime 09/16/21 1528     09/17/21 0400  metroNIDAZOLE (FLAGYL) IVPB 500 mg       Note to Pharmacy: 12 hours from first dose.   500 mg 100 mL/hr over 60 Minutes Intravenous Every  12 hours 09/16/21 2033     09/16/21 1515  ceFEPIme (MAXIPIME) 2 g in sodium chloride 0.9 % 100 mL IVPB        2 g 200 mL/hr over 30 Minutes Intravenous  Once 09/16/21 1506 09/16/21 1708   09/16/21 1515  metroNIDAZOLE (FLAGYL) IVPB 500 mg        500 mg 100 mL/hr over 60 Minutes Intravenous  Once 09/16/21 1506 09/16/21 1722   09/16/21 1515  vancomycin (VANCOREADY) IVPB 1000 mg/200 mL        1,000 mg 200 mL/hr over 60 Minutes Intravenous  Once 09/16/21 1506 09/16/21 1833       Subjective: Today, patient was seen and examined at bedside.  Complains of shortness of breath cough and pain on the right side of the chest especially on lying down on that side  Objective: Vitals:   09/17/21 0744 09/17/21 0831  BP: (!) 180/70   Pulse: 94   Resp:    Temp: 97.9 F (36.6 C)   SpO2: 95% 97%    Intake/Output Summary (Last 24 hours) at 09/17/2021 1134 Last data filed at 09/17/2021 0600 Gross per 24 hour  Intake 1676.25 ml  Output 300 ml  Net 1376.25 ml   Filed Weights   09/16/21 1226 09/16/21 1955 09/17/21 0600  Weight: 50.3 kg 58.9 kg 59 kg   Body mass index is 24.58 kg/m.   Physical Exam: GENERAL: Patient is alert awake and oriented. Not in obvious distress.  On 3 L of oxygen by nasal cannula. HENT: No scleral pallor or icterus. Pupils equally reactive to light. Oral mucosa is moist NECK: is supple, no gross swelling noted. CHEST:  diminished breath sounds bilaterally.  Coarse breath sounds noted. CVS: S1 and S2 heard, no murmur. Regular rate and rhythm.  Tachycardia. ABDOMEN: Soft, non-tender, bowel sounds are present. EXTREMITIES: No edema.  Left upper extremity AV fistula CNS: Cranial nerves are intact. No focal motor deficits. SKIN: warm and dry without rashes.  Data Review: I have personally reviewed the following laboratory data and studies,  CBC: Recent Labs  Lab 09/16/21 1242 09/17/21 0212  WBC 11.6* 11.7*  NEUTROABS 9.9*  --   HGB 10.1* 10.4*  HCT 31.9* 32.5*   MCV 91.7 91.0  PLT 354 557   Basic Metabolic Panel: Recent Labs  Lab 09/16/21 1242 09/17/21 0212  NA 134* 134*  K 4.7 5.2*  CL 99 101  CO2 12* 13*  GLUCOSE 143* 104*  BUN 81* 87*  CREATININE 15.59* 15.61*  CALCIUM 8.2* 8.4*  PHOS  --  11.5*   Liver Function Tests: Recent Labs  Lab 09/16/21 1242 09/17/21 0212  AST 13*  --   ALT 10  --   ALKPHOS 62  --   BILITOT 0.6  --   PROT 7.7  --   ALBUMIN 3.1* 2.8*   No results for input(s): LIPASE, AMYLASE in the last 168 hours. No results for input(s): AMMONIA in the last 168 hours. Cardiac Enzymes: No results  for input(s): CKTOTAL, CKMB, CKMBINDEX, TROPONINI in the last 168 hours. BNP (last 3 results) Recent Labs    09/16/21 1405  BNP 3,461.4*    ProBNP (last 3 results) No results for input(s): PROBNP in the last 8760 hours.  CBG: Recent Labs  Lab 09/16/21 2114 09/17/21 0742  GLUCAP 105* 121*   Recent Results (from the past 240 hour(s))  Resp Panel by RT-PCR (Flu A&B, Covid) Nasopharyngeal Swab     Status: None   Collection Time: 09/16/21  1:17 PM   Specimen: Nasopharyngeal Swab; Nasopharyngeal(NP) swabs in vial transport medium  Result Value Ref Range Status   SARS Coronavirus 2 by RT PCR NEGATIVE NEGATIVE Final    Comment: (NOTE) SARS-CoV-2 target nucleic acids are NOT DETECTED.  The SARS-CoV-2 RNA is generally detectable in upper respiratory specimens during the acute phase of infection. The lowest concentration of SARS-CoV-2 viral copies this assay can detect is 138 copies/mL. A negative result does not preclude SARS-Cov-2 infection and should not be used as the sole basis for treatment or other patient management decisions. A negative result may occur with  improper specimen collection/handling, submission of specimen other than nasopharyngeal swab, presence of viral mutation(s) within the areas targeted by this assay, and inadequate number of viral copies(<138 copies/mL). A negative result must be  combined with clinical observations, patient history, and epidemiological information. The expected result is Negative.  Fact Sheet for Patients:  EntrepreneurPulse.com.au  Fact Sheet for Healthcare Providers:  IncredibleEmployment.be  This test is no t yet approved or cleared by the [place] FDA and  has been authorized for detection and/or diagnosis of SARS-CoV-2 by FDA under an Emergency Use Authorization (EUA). This EUA will remain  in effect (meaning this test can be used) for the duration of the COVID-19 declaration under Section 564(b)(1) of the Act, 21 U.S.C.section 360bbb-3(b)(1), unless the authorization is terminated  or revoked sooner.       Influenza A by PCR NEGATIVE NEGATIVE Final   Influenza B by PCR NEGATIVE NEGATIVE Final    Comment: (NOTE) The Xpert Xpress SARS-CoV-2/FLU/RSV plus assay is intended as an aid in the diagnosis of influenza from Nasopharyngeal swab specimens and should not be used as a sole basis for treatment. Nasal washings and aspirates are unacceptable for Xpert Xpress SARS-CoV-2/FLU/RSV testing.  Fact Sheet for Patients: EntrepreneurPulse.com.au  Fact Sheet for Healthcare Providers: IncredibleEmployment.be  This test is not yet approved or cleared by the [place] FDA and has been authorized for detection and/or diagnosis of SARS-CoV-2 by FDA under an Emergency Use Authorization (EUA). This EUA will remain in effect (meaning this test can be used) for the duration of the COVID-19 declaration under Section 564(b)(1) of the Act, 21 U.S.C. section 360bbb-3(b)(1), unless the authorization is terminated or revoked.  Performed at [facility], [place] [address] [place], [place] [address]      Studies: DG Chest Port 1 View  Result Date: 09/16/2021 CLINICAL DATA:  Sepsis. EXAM: PORTABLE CHEST 1 VIEW COMPARISON:  December 10, 2020. FINDINGS: The heart size  and mediastinal contours are within normal limits. Both lungs are clear. The visualized skeletal structures are unremarkable. IMPRESSION: No active disease. Aortic Atherosclerosis (ICD10-I70.0). Electronically Signed   By: Marijo Conception M.D.   On: 09/16/2021 13:24      Flora Lipps, MD  [facility] 09/17/2021  If 7PM-7AM, please contact night-coverage

## 2021-09-17 NOTE — Progress Notes (Signed)
Notified Dr. Olena Heckle on call for Hospitalist regarding patient elevated Troponin at 210 compared to 94 earlier when patient was still in the ER. No c/o chest pain at this time. No new orders received.

## 2021-09-17 NOTE — Progress Notes (Addendum)
[facility] Progress Note   Subjective:   Pt seen in room. Reports feeling more short of breath today and is tachypneic. On O2 3L with good O2 saturations. Reports R lower chest pain, feels like she pulled a muscle. Denies headache, dizziness, abdominal pain and nausea.  Think that her tachypnea might be more due to metabolic acidosis than SOB as hypoxia has not changed but still   Objective Vitals:   09/17/21 0533 09/17/21 0600 09/17/21 0744 09/17/21 0831  BP: (!) 167/72  (!) 180/70   Pulse: 98  94   Resp: 16     Temp: 98.3 F (36.8 C)  97.9 F (36.6 C)   TempSrc: Oral  Oral   SpO2: 98%  95% 97%  Weight:  59 kg    Height:       Physical Exam General: Alert, mildly tachypneic, appears uncomfortable Heart: Slightly tachycardic, regular rhythm, no murmurs Lungs: + crackles bilateral lower lobes, mild tachypnea on O2 3L via [place] Abdomen: Soft, non-distended, +BS Extremities: no pitting edema bilateral lower extremities Dialysis Access: LUE AVF +t/b  Additional Objective Labs: Basic Metabolic Panel: Recent Labs  Lab 09/16/21 1242 09/17/21 0212  NA 134* 134*  K 4.7 5.2*  CL 99 101  CO2 12* 13*  GLUCOSE 143* 104*  BUN 81* 87*  CREATININE 15.59* 15.61*  CALCIUM 8.2* 8.4*  PHOS  --  11.5*   Liver Function Tests: Recent Labs  Lab 09/16/21 1242 09/17/21 0212  AST 13*  --   ALT 10  --   ALKPHOS 62  --   BILITOT 0.6  --   PROT 7.7  --   ALBUMIN 3.1* 2.8*   No results for input(s): LIPASE, AMYLASE in the last 168 hours. CBC: Recent Labs  Lab 09/16/21 1242 09/17/21 0212  WBC 11.6* 11.7*  NEUTROABS 9.9*  --   HGB 10.1* 10.4*  HCT 31.9* 32.5*  MCV 91.7 91.0  PLT 354 332   Blood Culture    Component Value Date/Time   SDES URINE, RANDOM 05/02/2019 1235   SPECREQUEST NONE 05/02/2019 1235   CULT  05/02/2019 1235    NO GROWTH Performed at [facility], [place] [address], [place], [place] [address]    REPTSTATUS 05/04/2019 FINAL 05/02/2019  1235    Cardiac Enzymes: No results for input(s): CKTOTAL, CKMB, CKMBINDEX, TROPONINI in the last 168 hours. CBG: Recent Labs  Lab 09/16/21 2114 09/17/21 0742  GLUCAP 105* 121*   Iron Studies: No results for input(s): IRON, TIBC, TRANSFERRIN, FERRITIN in the last 72 hours. @lablastinr3 @ Studies/Results: DG Chest Port 1 View  Result Date: 09/16/2021 CLINICAL DATA:  Sepsis. EXAM: PORTABLE CHEST 1 VIEW COMPARISON:  December 10, 2020. FINDINGS: The heart size and mediastinal contours are within normal limits. Both lungs are clear. The visualized skeletal structures are unremarkable. IMPRESSION: No active disease. Aortic Atherosclerosis (ICD10-I70.0). Electronically Signed   By: Marijo Conception M.D.   On: 09/16/2021 13:24   Medications:  sodium chloride     ceFEPime (MAXIPIME) IV     metronidazole 500 mg (09/17/21 0500)   [START ON 09/18/2021] vancomycin      amLODipine  10 mg Oral Daily   atropine  1 drop Both Eyes BID   buPROPion  150 mg Oral Daily   calcium acetate  2,001 mg Oral TID WC   Chlorhexidine Gluconate Cloth  6 each Topical Q0600   dorzolamide-timolol  1 drop Both Eyes BID   furosemide  40 mg Oral Daily  gabapentin  600 mg Oral BID   heparin  5,000 Units Subcutaneous Q8H   hydrALAZINE  150 mg Oral BID   insulin aspart  0-6 Units Subcutaneous TID WC   lamoTRIgine  25 mg Oral Daily   latanoprost  1 drop Both Eyes QHS   nicotine  14 mg Transdermal Daily   pravastatin  80 mg Oral QHS   sodium chloride flush  3 mL Intravenous Q12H   timolol  1 drop Left Eye QHS    Dialysis Orders: [facility]- unable to obtain orders (Thanksgiving day)  Assessment/Plan: 1 hypoxia - interesting that CXR is clear- has been coughing and SOB-  flu and covid negative-  has fever and wbc so is being treated with abx for possible PNA.  Worsening shortness of breath this AM. Unknown EDW but given SOB will attempt 3L with HD today 2 ESRD: has assignment MWF at [facility] unit-  is not  compliant it seems-  last HD was 11/16. BUN and creatinine elevated, K+ 5.2. Will plan for HD today and again tomorrow to get back on schedule. It is more due to BUN and acidosis 3 Hypertension: had one BP that was low-on admission, has been high since. Amlodipnie and hydralazine resumed  4. Anemia of ESRD: Hgb 10.4. not significant at this time-  no treatment needed 5. Metabolic Bone Disease: calcium wnl. Phos 11.5. Resume calcium acetate.  6. Metabolic acidosis-  bicarb 13-  probably from missed HD-  lactate not terrible -  got quite a bit of LR already. Monitor for improvement post HD.   Anice Paganini, PA-C 09/17/2021, 10:55 AM  [facility] Pager: [phone]  Patient seen and examined, agree with above note with above modifications. Pt curled up in bedside chair-  tachypneic maybe due to acidosis vs volume-  will need to do short HD today to get out of trouble with 3 liters [facility] Corliss Parish, MD 09/17/2021

## 2021-09-17 NOTE — Progress Notes (Signed)
Notified Dr. Olena Heckle regarding patient dyspnea with exertion and expiratory wheezes. Oxygen saturation 93% on 3L/[place]. Rt in to give patient RT treatment. Patient c/o right sided chest pain with exertion. Elevated BP in the 180's SBP. Hydralazine has the parameter to give if SBP greater than 190.

## 2021-09-18 ENCOUNTER — Inpatient Hospital Stay (HOSPITAL_COMMUNITY): Payer: Medicare Other

## 2021-09-18 DIAGNOSIS — I5021 Acute systolic (congestive) heart failure: Secondary | ICD-10-CM

## 2021-09-18 DIAGNOSIS — E1122 Type 2 diabetes mellitus with diabetic chronic kidney disease: Secondary | ICD-10-CM | POA: Diagnosis not present

## 2021-09-18 DIAGNOSIS — A419 Sepsis, unspecified organism: Secondary | ICD-10-CM | POA: Diagnosis not present

## 2021-09-18 DIAGNOSIS — J449 Chronic obstructive pulmonary disease, unspecified: Secondary | ICD-10-CM | POA: Diagnosis not present

## 2021-09-18 DIAGNOSIS — N184 Chronic kidney disease, stage 4 (severe): Secondary | ICD-10-CM | POA: Diagnosis not present

## 2021-09-18 LAB — RENAL FUNCTION PANEL
Albumin: 2.4 g/dL — ABNORMAL LOW (ref 3.5–5.0)
Anion gap: 13 (ref 5–15)
BUN: 33 mg/dL — ABNORMAL HIGH (ref 6–20)
CO2: 23 mmol/L (ref 22–32)
Calcium: 8.6 mg/dL — ABNORMAL LOW (ref 8.9–10.3)
Chloride: 97 mmol/L — ABNORMAL LOW (ref 98–111)
Creatinine, Ser: 7.63 mg/dL — ABNORMAL HIGH (ref 0.44–1.00)
GFR, Estimated: 6 mL/min — ABNORMAL LOW (ref >60)
Glucose, Bld: 157 mg/dL — ABNORMAL HIGH (ref 70–99)
Phosphorus: 4.2 mg/dL (ref 2.5–4.6)
Potassium: 3.4 mmol/L — ABNORMAL LOW (ref 3.5–5.1)
Sodium: 133 mmol/L — ABNORMAL LOW (ref 135–145)

## 2021-09-18 LAB — ECHOCARDIOGRAM COMPLETE
AR max vel: 1.65 cm2
AV Area VTI: 1.65 cm2
AV Area mean vel: 1.67 cm2
AV Mean grad: 10.5 mmHg
AV Peak grad: 20.2 mmHg
Ao pk vel: 2.25 m/s
Area-P 1/2: 2.95 cm2
Height: 61 in
MV VTI: 1.55 cm2
S' Lateral: 2.9 cm
Weight: 1957.68 oz

## 2021-09-18 LAB — CBC
HCT: 31.9 % — ABNORMAL LOW (ref 36.0–46.0)
Hemoglobin: 10.7 g/dL — ABNORMAL LOW (ref 12.0–15.0)
MCH: 29.1 pg (ref 26.0–34.0)
MCHC: 33.5 g/dL (ref 30.0–36.0)
MCV: 86.7 fL (ref 80.0–100.0)
Platelets: 316 10*3/uL (ref 150–400)
RBC: 3.68 MIL/uL — ABNORMAL LOW (ref 3.87–5.11)
RDW: 15.5 % (ref 11.5–15.5)
WBC: 9.7 10*3/uL (ref 4.0–10.5)
nRBC: 0 % (ref 0.0–0.2)

## 2021-09-18 LAB — GLUCOSE, CAPILLARY
Glucose-Capillary: 69 mg/dL — ABNORMAL LOW (ref 70–99)
Glucose-Capillary: 64 mg/dL — ABNORMAL LOW (ref 70–99)
Glucose-Capillary: 150 mg/dL — ABNORMAL HIGH (ref 70–99)

## 2021-09-18 LAB — PROCALCITONIN: Procalcitonin: 18.96 ng/mL

## 2021-09-18 LAB — HEPATITIS B SURFACE ANTIBODY, QUANTITATIVE: Hep B S AB Quant (Post): 126.7 m[IU]/mL (ref >9.9)

## 2021-09-18 MED ORDER — LOPERAMIDE HCL 2 MG PO CAPS
2.0000 mg | ORAL_CAPSULE | ORAL | Status: DC | PRN
  Administered 2021-09-18: 2 mg via ORAL
  Filled 2021-09-18: qty 1

## 2021-09-18 NOTE — Progress Notes (Incomplete)
*  PRELIMINARY RESULTS* Echocardiogram 2D Echocardiogram has been performed.  Tammy Moses 09/18/2021, 2:54 PM

## 2021-09-18 NOTE — Progress Notes (Signed)
[facility] Progress Note   Subjective:   Pt ended up getting a little HD yesterday- removed 500-  back on HD today -  feels better -  admits that she understands she is here because she missed HD   Objective Vitals:   09/18/21 0500 09/18/21 0807 09/18/21 0840 09/18/21 0906  BP:  (!) 160/61 (!) 165/60 (!) 150/66  Pulse:  78 79 78  Resp:  17 18   Temp:  98.8 F (37.1 C) (!) 97.4 F (36.3 C)   TempSrc:      SpO2:  94% 100%   Weight: 60.7 kg     Height:       Physical Exam General: Alert, more comfortable  Heart: Slightly tachycardic, regular rhythm, no murmurs Lungs: + crackles bilateral lower lobes, mild tachypnea on O2 3L via [place] Abdomen: Soft, non-distended, +BS Extremities: no pitting edema bilateral lower extremities Dialysis Access: LUE AVF +t/b  Additional Objective Labs: Basic Metabolic Panel: Recent Labs  Lab 09/16/21 1242 09/17/21 0212  NA 134* 134*  K 4.7 5.2*  CL 99 101  CO2 12* 13*  GLUCOSE 143* 104*  BUN 81* 87*  CREATININE 15.59* 15.61*  CALCIUM 8.2* 8.4*  PHOS  --  11.5*   Liver Function Tests: Recent Labs  Lab 09/16/21 1242 09/17/21 0212  AST 13*  --   ALT 10  --   ALKPHOS 62  --   BILITOT 0.6  --   PROT 7.7  --   ALBUMIN 3.1* 2.8*   No results for input(s): LIPASE, AMYLASE in the last 168 hours. CBC: Recent Labs  Lab 09/16/21 1242 09/17/21 0212  WBC 11.6* 11.7*  NEUTROABS 9.9*  --   HGB 10.1* 10.4*  HCT 31.9* 32.5*  MCV 91.7 91.0  PLT 354 332   Blood Culture    Component Value Date/Time   SDES BLOOD RIGHT HAND 09/16/2021 2029   SPECREQUEST  09/16/2021 2029    BOTTLES DRAWN AEROBIC AND ANAEROBIC Blood Culture adequate volume   CULT  09/16/2021 2029    NO GROWTH < 24 HOURS Performed at [facility], [place] [address] [place], [place] [address]    REPTSTATUS PENDING 09/16/2021 2029    Cardiac Enzymes: No results for input(s): CKTOTAL, CKMB, CKMBINDEX, TROPONINI in the last 168 hours. CBG: Recent Labs   Lab 09/16/21 2114 09/17/21 0742 09/17/21 1141 09/17/21 1545 09/17/21 2207  GLUCAP 105* 121* 108* 112* 124*   Iron Studies: No results for input(s): IRON, TIBC, TRANSFERRIN, FERRITIN in the last 72 hours. @lablastinr3 @ Studies/Results: DG Chest Port 1 View  Result Date: 09/16/2021 CLINICAL DATA:  Sepsis. EXAM: PORTABLE CHEST 1 VIEW COMPARISON:  December 10, 2020. FINDINGS: The heart size and mediastinal contours are within normal limits. Both lungs are clear. The visualized skeletal structures are unremarkable. IMPRESSION: No active disease. Aortic Atherosclerosis (ICD10-I70.0). Electronically Signed   By: Marijo Conception M.D.   On: 09/16/2021 13:24   Medications:  sodium chloride     ceFEPime (MAXIPIME) IV 1 g (09/17/21 2148)   metronidazole 500 mg (09/18/21 0352)   vancomycin      amLODipine  10 mg Oral Daily   atropine  1 drop Both Eyes BID   buPROPion  150 mg Oral Daily   calcium acetate  2,001 mg Oral TID WC   Chlorhexidine Gluconate Cloth  6 each Topical Q0600   Chlorhexidine Gluconate Cloth  6 each Topical Q0600   dorzolamide-timolol  1 drop Both Eyes BID  furosemide  40 mg Oral Daily   gabapentin  600 mg Oral BID   heparin  5,000 Units Subcutaneous Q8H   hydrALAZINE  150 mg Oral BID   insulin aspart  0-6 Units Subcutaneous TID WC   lamoTRIgine  25 mg Oral Daily   latanoprost  1 drop Both Eyes QHS   nicotine  14 mg Transdermal Daily   pravastatin  80 mg Oral QHS   sodium chloride flush  3 mL Intravenous Q12H   timolol  1 drop Left Eye QHS    Dialysis Orders: [facility]- unable to obtain orders   Assessment/Plan: 1 hypoxia - interesting that CXR is clear- has been coughing and SOB-  flu and covid negative-  has fever and wbc so is being treated with cefepime, vanc and flagyl for PNA.  Unknown EDW but given SOB will attempt 3L with HD today 2 ESRD: has assignment MWF at [facility] unit-  is not compliant it seems-  last HD was 11/16. BUN and creatinine elevated, K+  5.2. Will plan for HD today to get back on schedule. Next due on Monday  3 Hypertension: had one BP that was low-on admission, has been high since. Amlodipnie and hydralazine resumed -   also UF 4. Anemia of ESRD: Hgb 10.4. not significant at this time-  no treatment needed 5. Metabolic Bone Disease: calcium wnl. Phos 11.5. Resumed calcium acetate.  6. Metabolic acidosis-  bicarb 13-   from missed HD-  lactate not terrible -  should  improve post HD.  7. Dispo-  now off of O2-  I think is probably approaching discharge-  per primary team  Corliss Parish, MD 09/18/2021

## 2021-09-18 NOTE — Progress Notes (Signed)
PROGRESS NOTE  Tammy Moses:[id number] DOB: [birth year] DOA: 09/16/2021 PCP: [name], FNP (Inactive)   LOS: 2 days   Brief narrative:  Tammy Moses is a 55 y.o. female with past medical history of end-stage renal disease on hemodialysis Monday Wednesday Friday, type 2 diabetes mellitus, hypertension, hyperlipidemia, COPD and asthma presented to hospital with 1 week history of shortness of breath and cough with nausea and vomiting.  States that she is on oxygen at home at baseline and smokes 4 pack a day.  In the ED initial temperature was 101 F.  Initial creatinine was 15.59, anion gap 23, lactic acid 1.4, COVID and flu negative, troponin 94>pending, BNP 3461, urine analysis was negative for nitrites, negative leukocytes, few bacteria.  Chest x-ray did not show any acute infiltrate.  In the ED, code sepsis was activated, patient was given 1 L of Ringer lactate bolus, IV antibiotic and was admitted to hospital with nephrology consultation  Assessment/Plan:  Principal Problem:   Sepsis ([place]) Active Problems:   Essential hypertension   Diabetes mellitus ([place])   COPD (chronic obstructive pulmonary disease) (H[facility])   ESRD (end stage renal disease) (H[facility])   Increased anion gap metabolic acidosis   Elevated troponin   Anemia due to chronic kidney disease  SIRS/sepsis  Patient with cough, shortness of breath and fever.  Chest x-ray did not show obvious infiltrate.  HIV was nonreactive.  Leukocytosis present at 11.7.  Procalcitonin elevated at 4.04.  Urinalysis showed negative nitrites.  COVID and influenza was negative.  Blood cultures negative in less than 24 hours.  On vancomycin and cefepime.  Respiratory viral panel negative.  Temperature max of 100.3 F.  End stage renal disease with metabolic acidosis. On Monday, Wednesday, Friday schedule.  Nephrology on board.  Acute respiratory failure with  hypoxia  Was on oxygen by nasal cannula.  We will continue to wean as able.      Essential hypertension Continue Norvasc and hydralazine.  Closely monitor blood pressure.     Diabetes mellitus type II. Latest hemoglobin A1c of 5.7 from October 2022.  Continue sliding scale insulin.  She was supposed to be on long-acting but is not taking.  Continue diabetic diet.    Elevated troponin Initial troponin 94 followed by 210.  EKG without acute changes and no chest pain.  We will continue to monitor closely.  Could be secondary to demand ischemia.  Pending check 2D echocardiogram for wall motion abnormality   Elevated BNP with shortness of breath Patient had chest x-ray without any infiltrate.  Continue volume control with dialysis.  Intake and output charting.  Possible component of shortness of breath due to metabolic acidosis.    Chronic obstructive pulmonary disease On IV antibiotics, bronchodilators for COPD.   Anemia due to chronic kidney disease  -stable   Tobacco abuse Nicotine patch, counseled about quitting  DVT prophylaxis: heparin injection 5,000 Units Start: 09/16/21 2200   Code Status: Full code  Family Communication:  Spoke with the patient at bedside  Status is: Inpatient  Remains inpatient appropriate because:  Hypoxic respiratory failure, possible sepsis, hemodialysis patient,  Consultants: Nephrology  Procedures: Hemodialysis  Anti-infectives:  Vancomycin and  cefepime  Anti-infectives (From admission, onward)    Start     Dose/Rate Route Frequency Ordered Stop   09/18/21 1200  vancomycin (VANCOREADY) IVPB 750 mg/150 mL  Status:  Discontinued        750 mg 150 mL/hr over 60 Minutes Intravenous Every M-W-F (Hemodialysis)  09/16/21 1520 09/16/21 1522   09/18/21 1200  vancomycin (VANCOREADY) IVPB 500 mg/100 mL        500 mg 100 mL/hr over 60 Minutes Intravenous Every M-W-F (Hemodialysis) 09/16/21 1522     09/17/21 2200  ceFEPIme (MAXIPIME) 1 g in sodium chloride 0.9 % 100 mL IVPB        1 g 200 mL/hr over 30 Minutes Intravenous  Daily at bedtime 09/16/21 1528     09/17/21 0400  metroNIDAZOLE (FLAGYL) IVPB 500 mg       Note to Pharmacy: 12 hours from first dose.   500 mg 100 mL/hr over 60 Minutes Intravenous Every 12 hours 09/16/21 2033     09/16/21 1515  ceFEPIme (MAXIPIME) 2 g in sodium chloride 0.9 % 100 mL IVPB        2 g 200 mL/hr over 30 Minutes Intravenous  Once 09/16/21 1506 09/16/21 1708   09/16/21 1515  metroNIDAZOLE (FLAGYL) IVPB 500 mg        500 mg 100 mL/hr over 60 Minutes Intravenous  Once 09/16/21 1506 09/16/21 1722   09/16/21 1515  vancomycin (VANCOREADY) IVPB 1000 mg/200 mL        1,000 mg 200 mL/hr over 60 Minutes Intravenous  Once 09/16/21 1506 09/16/21 1833      Subjective: Today, patient was seen and examined at bedside.  Complains of body pain especially on the legs.  Denies overt shortness of breath or chest pain.  No nausea, vomiting or abdominal pain.  Mild cough  Objective: Vitals:   09/18/21 1130 09/18/21 1200  BP: (!) 186/71 (!) 168/60  Pulse: 84 85  Resp:    Temp:    SpO2:      Intake/Output Summary (Last 24 hours) at 09/18/2021 1242 Last data filed at 09/17/2021 1555 Gross per 24 hour  Intake --  Output 500 ml  Net -500 ml    Filed Weights   09/17/21 1555 09/18/21 0500 09/18/21 0840  Weight: 60.4 kg 60.7 kg 58.6 kg   Body mass index is 24.41 kg/m.   Physical Exam:  GENERAL: Patient is alert awake and oriented. Not in obvious distress.   HENT: No scleral pallor or icterus. Pupils equally reactive to light. Oral mucosa is moist NECK: is supple, no gross swelling noted. CHEST: Decreased breath sounds bilaterally.  Coarse breath sounds noted. CVS: S1 and S2 heard, no murmur. Regular rate and rhythm.  Tachycardia. ABDOMEN: Soft, non-tender, bowel sounds are present. EXTREMITIES: Left upper extremity AV fistula. CNS: Cranial nerves are intact. No focal motor deficits. SKIN: warm and dry without rashes.  Data Review: I have personally reviewed the following  laboratory data and studies,  CBC: Recent Labs  Lab 09/16/21 1242 09/17/21 0212  WBC 11.6* 11.7*  NEUTROABS 9.9*  --   HGB 10.1* 10.4*  HCT 31.9* 32.5*  MCV 91.7 91.0  PLT 354 510    Basic Metabolic Panel: Recent Labs  Lab 09/16/21 1242 09/17/21 0212  NA 134* 134*  K 4.7 5.2*  CL 99 101  CO2 12* 13*  GLUCOSE 143* 104*  BUN 81* 87*  CREATININE 15.59* 15.61*  CALCIUM 8.2* 8.4*  PHOS  --  11.5*    Liver Function Tests: Recent Labs  Lab 09/16/21 1242 09/17/21 0212  AST 13*  --   ALT 10  --   ALKPHOS 62  --   BILITOT 0.6  --   PROT 7.7  --   ALBUMIN 3.1* 2.8*    No results for  input(s): LIPASE, AMYLASE in the last 168 hours. No results for input(s): AMMONIA in the last 168 hours. Cardiac Enzymes: No results for input(s): CKTOTAL, CKMB, CKMBINDEX, TROPONINI in the last 168 hours. BNP (last 3 results) Recent Labs    09/16/21 1405  BNP 3,461.4*     ProBNP (last 3 results) No results for input(s): PROBNP in the last 8760 hours.  CBG: Recent Labs  Lab 09/16/21 2114 09/17/21 0742 09/17/21 1141 09/17/21 1545 09/17/21 2207  GLUCAP 105* 121* 108* 112* 124*    Recent Results (from the past 240 hour(s))  Blood Culture (routine x 2)     Status: None (Preliminary result)   Collection Time: 09/16/21 12:13 PM   Specimen: BLOOD RIGHT ARM  Result Value Ref Range Status   Specimen Description BLOOD RIGHT ARM  Final   Special Requests   Final    BOTTLES DRAWN AEROBIC AND ANAEROBIC Blood Culture adequate volume   Culture   Final    NO GROWTH 1 DAY Performed at [facility], [place] [address] [place], [place] [address]    Report Status PENDING  Incomplete  Resp Panel by RT-PCR (Flu A&B, Covid) Nasopharyngeal Swab     Status: None   Collection Time: 09/16/21  1:17 PM   Specimen: Nasopharyngeal Swab; Nasopharyngeal(NP) swabs in vial transport medium  Result Value Ref Range Status   SARS Coronavirus 2 by RT PCR NEGATIVE NEGATIVE Final    Comment:  (NOTE) SARS-CoV-2 target nucleic acids are NOT DETECTED.  The SARS-CoV-2 RNA is generally detectable in upper respiratory specimens during the acute phase of infection. The lowest concentration of SARS-CoV-2 viral copies this assay can detect is 138 copies/mL. A negative result does not preclude SARS-Cov-2 infection and should not be used as the sole basis for treatment or other patient management decisions. A negative result may occur with  improper specimen collection/handling, submission of specimen other than nasopharyngeal swab, presence of viral mutation(s) within the areas targeted by this assay, and inadequate number of viral copies(<138 copies/mL). A negative result must be combined with clinical observations, patient history, and epidemiological information. The expected result is Negative.  Fact Sheet for Patients:  EntrepreneurPulse.com.au  Fact Sheet for Healthcare Providers:  IncredibleEmployment.be  This test is no t yet approved or cleared by the [place] FDA and  has been authorized for detection and/or diagnosis of SARS-CoV-2 by FDA under an Emergency Use Authorization (EUA). This EUA will remain  in effect (meaning this test can be used) for the duration of the COVID-19 declaration under Section 564(b)(1) of the Act, 21 U.S.C.section 360bbb-3(b)(1), unless the authorization is terminated  or revoked sooner.       Influenza A by PCR NEGATIVE NEGATIVE Final   Influenza B by PCR NEGATIVE NEGATIVE Final    Comment: (NOTE) The Xpert Xpress SARS-CoV-2/FLU/RSV plus assay is intended as an aid in the diagnosis of influenza from Nasopharyngeal swab specimens and should not be used as a sole basis for treatment. Nasal washings and aspirates are unacceptable for Xpert Xpress SARS-CoV-2/FLU/RSV testing.  Fact Sheet for Patients: EntrepreneurPulse.com.au  Fact Sheet for Healthcare  Providers: IncredibleEmployment.be  This test is not yet approved or cleared by the [place] FDA and has been authorized for detection and/or diagnosis of SARS-CoV-2 by FDA under an Emergency Use Authorization (EUA). This EUA will remain in effect (meaning this test can be used) for the duration of the COVID-19 declaration under Section 564(b)(1) of the Act, 21 U.S.C. section 360bbb-3(b)(1), unless the  authorization is terminated or revoked.  Performed at [facility], [place] [address] [place], [place] [address]   Respiratory (~20 pathogens) panel by PCR     Status: None   Collection Time: 09/16/21  1:17 PM   Specimen: Nasopharyngeal Swab; Respiratory  Result Value Ref Range Status   Adenovirus NOT DETECTED NOT DETECTED Final   Coronavirus 229E NOT DETECTED NOT DETECTED Final    Comment: (NOTE) The Coronavirus on the Respiratory Panel, DOES NOT test for the novel  Coronavirus (2019 nCoV)    Coronavirus HKU1 NOT DETECTED NOT DETECTED Final   Coronavirus NL63 NOT DETECTED NOT DETECTED Final   Coronavirus OC43 NOT DETECTED NOT DETECTED Final   Metapneumovirus NOT DETECTED NOT DETECTED Final   Rhinovirus / Enterovirus NOT DETECTED NOT DETECTED Final   Influenza A NOT DETECTED NOT DETECTED Final   Influenza B NOT DETECTED NOT DETECTED Final   Parainfluenza Virus 1 NOT DETECTED NOT DETECTED Final   Parainfluenza Virus 2 NOT DETECTED NOT DETECTED Final   Parainfluenza Virus 3 NOT DETECTED NOT DETECTED Final   Parainfluenza Virus 4 NOT DETECTED NOT DETECTED Final   Respiratory Syncytial Virus NOT DETECTED NOT DETECTED Final   Bordetella pertussis NOT DETECTED NOT DETECTED Final   Bordetella Parapertussis NOT DETECTED NOT DETECTED Final   Chlamydophila pneumoniae NOT DETECTED NOT DETECTED Final   Mycoplasma pneumoniae NOT DETECTED NOT DETECTED Final    Comment: Performed at [facility], [place]. [address] [place], [place] [address]  Blood Culture  (routine x 2)     Status: None (Preliminary result)   Collection Time: 09/16/21  8:29 PM   Specimen: BLOOD RIGHT HAND  Result Value Ref Range Status   Specimen Description BLOOD RIGHT HAND  Final   Special Requests   Final    BOTTLES DRAWN AEROBIC AND ANAEROBIC Blood Culture adequate volume   Culture   Final    NO GROWTH < 24 HOURS Performed at [facility], [place] [address] [place], [place] [address]    Report Status PENDING  Incomplete      Studies: DG Chest Port 1 View  Result Date: 09/16/2021 CLINICAL DATA:  Sepsis. EXAM: PORTABLE CHEST 1 VIEW COMPARISON:  December 10, 2020. FINDINGS: The heart size and mediastinal contours are within normal limits. Both lungs are clear. The visualized skeletal structures are unremarkable. IMPRESSION: No active disease. Aortic Atherosclerosis (ICD10-I70.0). Electronically Signed   By: Marijo Conception M.D.   On: 09/16/2021 13:24      Flora Lipps, MD  [facility] 09/18/2021  If 7PM-7AM, please contact night-coverage

## 2021-09-18 NOTE — Procedures (Signed)
Patient was seen on dialysis and the procedure was supervised.  BFR 300  Via AVF BP is  179/66.   Patient appears to be tolerating treatment well  Louis Meckel 09/18/2021

## 2021-09-19 DIAGNOSIS — A419 Sepsis, unspecified organism: Secondary | ICD-10-CM | POA: Diagnosis not present

## 2021-09-19 DIAGNOSIS — N184 Chronic kidney disease, stage 4 (severe): Secondary | ICD-10-CM | POA: Diagnosis not present

## 2021-09-19 DIAGNOSIS — E1122 Type 2 diabetes mellitus with diabetic chronic kidney disease: Secondary | ICD-10-CM | POA: Diagnosis not present

## 2021-09-19 DIAGNOSIS — J449 Chronic obstructive pulmonary disease, unspecified: Secondary | ICD-10-CM | POA: Diagnosis not present

## 2021-09-19 LAB — GLUCOSE, CAPILLARY
Glucose-Capillary: 108 mg/dL — ABNORMAL HIGH (ref 70–99)
Glucose-Capillary: 115 mg/dL — ABNORMAL HIGH (ref 70–99)
Glucose-Capillary: 125 mg/dL — ABNORMAL HIGH (ref 70–99)
Glucose-Capillary: 137 mg/dL — ABNORMAL HIGH (ref 70–99)
Glucose-Capillary: 146 mg/dL — ABNORMAL HIGH (ref 70–99)
Glucose-Capillary: 164 mg/dL — ABNORMAL HIGH (ref 70–99)

## 2021-09-19 NOTE — Progress Notes (Signed)
Patient refused iv and requested it be taken out.  MD is aware

## 2021-09-19 NOTE — Plan of Care (Signed)
  Problem: Pain Managment: Goal: General experience of comfort will improve Outcome: Progressing   Problem: Safety: Goal: Ability to remain free from injury will improve Outcome: Progressing   Problem: Skin Integrity: Goal: Risk for impaired skin integrity will decrease Outcome: Progressing   

## 2021-09-19 NOTE — Evaluation (Signed)
Physical Therapy Evaluation Patient Details Name: Tammy Moses MRN: [id number] DOB: 08/19/1966 Today's Date: 09/19/2021  History of Present Illness  55 y/o presented to ED on 11/23 for 1 week history of SOB and cough with nausea/vomiting. Also has missed HD for unknown time, told nephrology 11/16. Chest x-ray showed no obvious infiltrate. PMH: diabetes, ESRD on HD, diabetic neuropathy, diabetic retinopathy, HTN, tobacco dependence, blind in R eye, COPD/asthma  Clinical Impression  Patient admitted with above diagnosis. Patient presents with poor safety awareness, generalized weakness, impaired balance, decreased activity tolerance, and impaired cognition. Patient with chronic R foot drop but does not have brace. Patient reports frequent falls at home. Patient has PCA 2-3 hours/day MWF and they come at [facility]. Patient currently requiring min-modA for short ambulation with Rw due to balance deficits and R LE weakness. Educated patient on benefits of rehab prior to returning home for safety or having someone live with her or she live with them at discharge, patient not agreeable. Patient will benefit from skilled PT services during acute stay to address listed deficits. Recommend SNF at discharge to maximize functional independence, safety,and reduce fall risk. Patient will more than likely refuse SNF placement and will need max HH services ([place], [place], and [place] aide).     Recommendations for follow up therapy are one component of a multi-disciplinary discharge planning process, led by the attending physician.  Recommendations may be updated based on patient status, additional functional criteria and insurance authorization.  Follow Up Recommendations Skilled nursing-short term rehab (<3 hours/day)    Assistance Recommended at Discharge Frequent or constant Supervision/Assistance  Functional Status Assessment Patient has had a recent decline in their functional status and demonstrates the ability to make  significant improvements in function in a reasonable and predictable amount of time.  Equipment Recommendations  Rolling Adhrit Krenz (2 wheels)    Recommendations for Other Services       Precautions / Restrictions Precautions Precautions: Fall Precaution Comments: blind, R foot drop Restrictions Weight Bearing Restrictions: No      Mobility  Bed Mobility Overal bed mobility: Needs Assistance Bed Mobility: Supine to Sit;Sit to Supine     Supine to sit: Supervision Sit to supine: Supervision   General bed mobility comments: supervision for safety due to impulsivity    Transfers Overall transfer level: Needs assistance Equipment used: Straight cane Transfers: Sit to/from Stand Sit to Stand: Min assist           General transfer comment: minA to steady once standing. Unstable with use of cane so provided Rw    Ambulation/Gait Ambulation/Gait assistance: Min assist;Mod assist Gait Distance (Feet): 25 Feet Assistive device: Rolling Eon Zunker (2 wheels) Gait Pattern/deviations: Step-to pattern;Decreased stride length;Decreased dorsiflexion - right;Knees buckling;Wide base of support Gait velocity: decreased     General Gait Details: requires max cues for directions and RW management. Min-modA for balance with 1-2 instances of R knee buckling  Stairs            Wheelchair Mobility    Modified Rankin (Stroke Patients Only)       Balance Overall balance assessment: Needs assistance Sitting-balance support: No upper extremity supported;Feet supported Sitting balance-Leahy Scale: Fair     Standing balance support: Bilateral upper extremity supported;During functional activity Standing balance-Leahy Scale: Poor Standing balance comment: reliant on UE support and external assist  Pertinent Vitals/Pain Pain Assessment: No/denies pain    Home Living Family/patient expects to be discharged to:: Private residence Living  Arrangements: Alone Available Help at Discharge: Personal care attendant Type of Home: Apartment Home Access: Level entry       Home Layout: One level Home Equipment: Rollator (4 wheels);Cane - single point      Prior Function Prior Level of Function : Independent/Modified Independent;History of Falls (last six months)             Mobility Comments: reports 4-5 falls in past 6 months. Suspect more based off presentation ADLs Comments: has PCA 2-3 hours/day and comes 3 AM MWF     Hand Dominance   Dominant Hand: Right    Extremity/Trunk Assessment   Upper Extremity Assessment Upper Extremity Assessment: Generalized weakness    Lower Extremity Assessment Lower Extremity Assessment: Generalized weakness (chronic R foot drop (does not have brace))    Cervical / Trunk Assessment Cervical / Trunk Assessment: Kyphotic  Communication   Communication: No difficulties  Cognition Arousal/Alertness: Awake/alert Behavior During Therapy: Impulsive Overall Cognitive Status: No family/caregiver present to determine baseline cognitive functioning                                 General Comments: patient impulsive and with poor safety awareness. patient aware that she may fall at home but refuses SNF. No family present        General Comments      Exercises     Assessment/Plan    PT Assessment Patient needs continued PT services  PT Problem List Decreased strength;Decreased mobility;Decreased activity tolerance;Decreased balance;Decreased cognition;Decreased knowledge of use of DME;Decreased safety awareness       PT Treatment Interventions DME instruction;Gait training;Functional mobility training;Therapeutic activities;Therapeutic exercise;Balance training;Patient/family education    PT Goals (Current goals can be found in the Care Plan section)  Acute Rehab PT Goals Patient Stated Goal: to get this R leg better PT Goal Formulation: With patient Time  For Goal Achievement: 10/03/21 Potential to Achieve Goals: Fair    Frequency Min 3X/week   Barriers to discharge Decreased caregiver support      Co-evaluation               AM-PAC PT "6 Clicks" Mobility  Outcome Measure Help needed turning from your back to your side while in a flat bed without using bedrails?: A Little Help needed moving from lying on your back to sitting on the side of a flat bed without using bedrails?: A Little Help needed moving to and from a bed to a chair (including a wheelchair)?: A Little Help needed standing up from a chair using your arms (e.g., wheelchair or bedside chair)?: A Lot Help needed to walk in hospital room?: A Lot Help needed climbing 3-5 steps with a railing? : Total 6 Click Score: 14    End of Session Equipment Utilized During Treatment: Gait belt Activity Tolerance: Patient tolerated treatment well Patient left: in bed;with bed alarm set;with call bell/phone within reach Nurse Communication: Mobility status PT Visit Diagnosis: Unsteadiness on feet (R26.81);Muscle weakness (generalized) (M62.81);Repeated falls (R29.6);History of falling (Z91.81);Other abnormalities of gait and mobility (R26.89);Difficulty in walking, not elsewhere classified (R26.2)    Time: 1610-9604 PT Time Calculation (min) (ACUTE ONLY): 24 min   Charges:   PT Evaluation $PT Eval Moderate Complexity: 1 Mod PT Treatments $Therapeutic Activity: 8-22 mins        Alletta Mattos  Raina Mina, PT, DPT Acute Rehabilitation Services Pager [phone] Office [phone]   Linna Hoff 09/19/2021, 5:16 PM
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
## 2021-09-19 NOTE — Progress Notes (Addendum)
Mobility Specialist Progress Note   09/19/21 1210  Mobility  Activity Ambulated in hall  Level of Assistance Moderate assist, patient does 50-74%  Assistive Device [employer] Ambulated (ft) 50 ft  Mobility Ambulated with assistance in hallway  Mobility Response Tolerated well  Mobility performed by Mobility specialist  $Mobility charge 1 Mobility   Received pt in bed having no complaints and agreeable to mobility. Pt visually impaired and requires heavy VC for safe hand placement and direction. Contact guard for STS but Mod A for ambulation d/t LE weakness presented in episodes of LE giving out. X1 standing rest break d/t fatigue. Returned back to bed w/ call bell by side and all needs met.  Pt is requesting orders for physical therapy, expressed that mobility specialist will work w/ them in the mean time and I relayed message to RN.   Holland Falling Mobility Specialist Phone Number [phone]

## 2021-09-19 NOTE — Progress Notes (Addendum)
[facility] Progress Note   Subjective: Seen in room, asked me what I wanted. No C/Os. Working with PT. Wants to go home. HD yesterday -  removed another 2500 tolerated well=  on RA this AM   Objective Vitals:   09/18/21 1253 09/18/21 1943 09/19/21 0500 09/19/21 1007  BP: (!) 165/50 (!) 141/55  (!) 133/46  Pulse: 84 82  71  Resp: 19 17  17   Temp: 98.3 F (36.8 C) 98.7 F (37.1 C)  98.5 F (36.9 C)  TempSrc: Oral Oral  Oral  SpO2: 97% 98%  98%  Weight:   56.8 kg   Height:       Physical Exam General: chronically ill appearing female in NAD Heart: S1,S2 RRR Lungs: CTAB Abdomen: NABS, NT Extremities: No LE edema Dialysis Access: L AVF + T/B   Additional Objective Labs: Basic Metabolic Panel: Recent Labs  Lab 09/16/21 1242 09/17/21 0212 09/18/21 1538  NA 134* 134* 133*  K 4.7 5.2* 3.4*  CL 99 101 97*  CO2 12* 13* 23  GLUCOSE 143* 104* 157*  BUN 81* 87* 33*  CREATININE 15.59* 15.61* 7.63*  CALCIUM 8.2* 8.4* 8.6*  PHOS  --  11.5* 4.2   Liver Function Tests: Recent Labs  Lab 09/16/21 1242 09/17/21 0212 09/18/21 1538  AST 13*  --   --   ALT 10  --   --   ALKPHOS 62  --   --   BILITOT 0.6  --   --   PROT 7.7  --   --   ALBUMIN 3.1* 2.8* 2.4*   No results for input(s): LIPASE, AMYLASE in the last 168 hours. CBC: Recent Labs  Lab 09/16/21 1242 09/17/21 0212 09/18/21 1538  WBC 11.6* 11.7* 9.7  NEUTROABS 9.9*  --   --   HGB 10.1* 10.4* 10.7*  HCT 31.9* 32.5* 31.9*  MCV 91.7 91.0 86.7  PLT 354 332 316   Blood Culture    Component Value Date/Time   SDES BLOOD RIGHT HAND 09/16/2021 2029   SPECREQUEST  09/16/2021 2029    BOTTLES DRAWN AEROBIC AND ANAEROBIC Blood Culture adequate volume   CULT  09/16/2021 2029    NO GROWTH 3 DAYS Performed at [facility], [place] [address] [place], [place] [address]    REPTSTATUS PENDING 09/16/2021 2029    Cardiac Enzymes: No results for input(s): CKTOTAL, CKMB, CKMBINDEX, TROPONINI in the  last 168 hours. CBG: Recent Labs  Lab 09/18/21 1336 09/18/21 1537 09/18/21 2359 09/19/21 0342 09/19/21 0826  GLUCAP 64* 150* 108* 146* 115*   Iron Studies: No results for input(s): IRON, TIBC, TRANSFERRIN, FERRITIN in the last 72 hours. @lablastinr3 @ Studies/Results: ECHOCARDIOGRAM COMPLETE  Result Date: 09/18/2021    ECHOCARDIOGRAM REPORT   Patient Name:   Tammy Moses Date of Exam: 09/18/2021 Medical Rec #:  [id number]      Height:       61.0 in Accession #:    [id number]     Weight:       122.4 lb Date of Birth:  1966/04/04      BSA:          1.533 m Patient Age:    25 years       BP:           160/66 mmHg Patient Gender: F              HR:           81 bpm. Exam  Location:  Inpatient Procedure: 2D Echo, Cardiac Doppler and Color Doppler Indications:    CHF-Acute Systolic  History:        Patient has no prior history of Echocardiogram examinations.                 COPD; Risk Factors:Hypertension and Diabetes.  Sonographer:    Wenda Low Referring Phys: [id number] [place]  1. Left ventricular ejection fraction, by estimation, is 60 to 65%. The left ventricle has normal function. The left ventricle has no regional wall motion abnormalities. There is moderate left ventricular hypertrophy. Left ventricular diastolic parameters are indeterminate.  2. Right ventricular systolic function is normal. The right ventricular size is normal. There is mildly elevated pulmonary artery systolic pressure.  3. The mitral valve is degenerative. Trivial mitral valve regurgitation. No evidence of mitral stenosis. Moderate mitral annular calcification.  4. The aortic valve is tricuspid. There is moderate calcification of the aortic valve. There is moderate thickening of the aortic valve. Aortic valve regurgitation is not visualized. Mild aortic valve stenosis.  5. The inferior vena cava is normal in size with greater than 50% respiratory variability, suggesting right atrial pressure of 3 mmHg.  FINDINGS  Left Ventricle: Left ventricular ejection fraction, by estimation, is 60 to 65%. The left ventricle has normal function. The left ventricle has no regional wall motion abnormalities. The left ventricular internal cavity size was normal in size. There is  moderate left ventricular hypertrophy. Left ventricular diastolic parameters are indeterminate. Right Ventricle: The right ventricular size is normal. No increase in right ventricular wall thickness. Right ventricular systolic function is normal. There is mildly elevated pulmonary artery systolic pressure. The tricuspid regurgitant velocity is 2.96  m/s, and with an assumed right atrial pressure of 8 mmHg, the estimated right ventricular systolic pressure is 10.1 mmHg. Left Atrium: Left atrial size was normal in size. Right Atrium: Right atrial size was normal in size. Pericardium: There is no evidence of pericardial effusion. Mitral Valve: The mitral valve is degenerative in appearance. There is mild thickening of the mitral valve leaflet(s). There is mild calcification of the mitral valve leaflet(s). Moderate mitral annular calcification. Trivial mitral valve regurgitation. No evidence of mitral valve stenosis. MV peak gradient, 10.8 mmHg. The mean mitral valve gradient is 4.0 mmHg. Tricuspid Valve: The tricuspid valve is normal in structure. Tricuspid valve regurgitation is mild . No evidence of tricuspid stenosis. Aortic Valve: The aortic valve is tricuspid. There is moderate calcification of the aortic valve. There is moderate thickening of the aortic valve. Aortic valve regurgitation is not visualized. Mild aortic stenosis is present. Aortic valve mean gradient measures 10.5 mmHg. Aortic valve peak gradient measures 20.2 mmHg. Aortic valve area, by VTI measures 1.65 cm. Pulmonic Valve: The pulmonic valve was normal in structure. Pulmonic valve regurgitation is not visualized. No evidence of pulmonic stenosis. Aorta: The aortic root is normal in size  and structure. Venous: The inferior vena cava is normal in size with greater than 50% respiratory variability, suggesting right atrial pressure of 3 mmHg. IAS/Shunts: No atrial level shunt detected by color flow Doppler.  LEFT VENTRICLE PLAX 2D LVIDd:         4.80 cm   Diastology LVIDs:         2.90 cm   LV e' medial:    4.68 cm/s LV PW:         1.30 cm   LV E/e' medial:  25.6 LV IVS:  1.50 cm   LV e' lateral:   6.42 cm/s LVOT diam:     1.90 cm   LV E/e' lateral: 18.7 LV SV:         63 LV SV Index:   41 LVOT Area:     2.84 cm  RIGHT VENTRICLE RV Basal diam:  3.15 cm RV Mid diam:    2.60 cm RV S prime:     11.70 cm/s TAPSE (M-mode): 2.0 cm LEFT ATRIUM           Index        RIGHT ATRIUM           Index LA diam:      3.60 cm 2.35 cm/m   RA Area:     15.60 cm LA Vol (A2C): 37.8 ml 24.66 ml/m  RA Volume:   40.00 ml  26.10 ml/m LA Vol (A4C): 54.2 ml 35.36 ml/m  AORTIC VALVE                     PULMONIC VALVE AV Area (Vmax):    1.65 cm      PV Vmax:       0.98 m/s AV Area (Vmean):   1.67 cm      PV Peak grad:  3.9 mmHg AV Area (VTI):     1.65 cm AV Vmax:           224.50 cm/s AV Vmean:          151.000 cm/s AV VTI:            0.380 m AV Peak Grad:      20.2 mmHg AV Mean Grad:      10.5 mmHg LVOT Vmax:         131.00 cm/s LVOT Vmean:        89.000 cm/s LVOT VTI:          0.221 m LVOT/AV VTI ratio: 0.58  AORTA Ao Root diam: 2.80 cm MITRAL VALVE                TRICUSPID VALVE MV Area (PHT): 2.95 cm     TR Peak grad:   35.0 mmHg MV Area VTI:   1.55 cm     TR Vmax:        296.00 cm/s MV Peak grad:  10.8 mmHg MV Mean grad:  4.0 mmHg     SHUNTS MV Vmax:       1.64 m/s     Systemic VTI:  0.22 m MV Vmean:      96.0 cm/s    Systemic Diam: 1.90 cm MV Decel Time: 257 msec MV E velocity: 120.00 cm/s MV A velocity: 134.00 cm/s MV E/A ratio:  0.90 Jenkins Rouge MD Electronically signed by Jenkins Rouge MD Signature Date/Time: 09/18/2021/3:06:24 PM    Final    Medications:  sodium chloride     ceFEPime (MAXIPIME) IV  Stopped (09/18/21 2232)   metronidazole 500 mg (09/19/21 0535)   vancomycin 500 mg (09/18/21 1104)    amLODipine  10 mg Oral Daily   atropine  1 drop Both Eyes BID   buPROPion  150 mg Oral Daily   calcium acetate  2,001 mg Oral TID WC   Chlorhexidine Gluconate Cloth  6 each Topical Q0600   Chlorhexidine Gluconate Cloth  6 each Topical Q0600   dorzolamide-timolol  1 drop Both Eyes BID   furosemide  40 mg Oral Daily   gabapentin  600 mg Oral  BID   heparin  5,000 Units Subcutaneous Q8H   hydrALAZINE  150 mg Oral BID   insulin aspart  0-6 Units Subcutaneous TID WC   lamoTRIgine  25 mg Oral Daily   latanoprost  1 drop Both Eyes QHS   nicotine  14 mg Transdermal Daily   pravastatin  80 mg Oral QHS   sodium chloride flush  3 mL Intravenous Q12H   timolol  1 drop Left Eye QHS     Dialysis Orders: [facility]. Will call for orders.   Assessment/Plan: 1 hypoxia - interesting that CXR is clear- Extra volume removed with HD -Net UF with HD 2.5 09/18/2021. Now on RA. Per primary 2. SIRS/Sepsis. No obvious source. ABX per primary.  3 ESRD: [facility] MWF. Next HD 09/21/2021 hopefully as OP  4 Hypertension/Volume: BP better controlled. Amlodipine and hydralazine resumed. Post wt 55.5 kg 09/18/2021. Getting orders from OP clinic.  5. 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 No ESA needed. Follow HGB. 6. Metabolic Bone Disease: calcium wnl. Phos 11.5 on admit. Now at goal.  Resumed calcium acetate.  7. Metabolic acidosis- Resolved with HD.  8. Dispo-  now off of O2-  I think is probably safe for discharge-  per primary team. She wants to go home.   Rita H. Brown NP-C 09/19/2021, 11:27 AM  [facility] [phone]  Patient seen and examined, agree with above note with above modifications. She is talkative-  she c/o pain in her feet-  asks how many neurontin she can take and if she could take anything else-  I told her to discuss it with her rounding nephrology team when she sees them.  She seems ready  for discharge -  I will communicate to the primary team  Corliss Parish, MD 09/19/2021

## 2021-09-20 ENCOUNTER — Inpatient Hospital Stay (HOSPITAL_COMMUNITY): Payer: Medicare Other

## 2021-09-20 DIAGNOSIS — A419 Sepsis, unspecified organism: Secondary | ICD-10-CM | POA: Diagnosis not present

## 2021-09-20 LAB — CBC
HCT: 29.1 % — ABNORMAL LOW (ref 36.0–46.0)
Hemoglobin: 9.4 g/dL — ABNORMAL LOW (ref 12.0–15.0)
MCH: 28.8 pg (ref 26.0–34.0)
MCHC: 32.3 g/dL (ref 30.0–36.0)
MCV: 89.3 fL (ref 80.0–100.0)
Platelets: 339 10*3/uL (ref 150–400)
RBC: 3.26 MIL/uL — ABNORMAL LOW (ref 3.87–5.11)
RDW: 15.8 % — ABNORMAL HIGH (ref 11.5–15.5)
WBC: 8.6 10*3/uL (ref 4.0–10.5)
nRBC: 0 % (ref 0.0–0.2)

## 2021-09-20 LAB — BASIC METABOLIC PANEL
Anion gap: 15 (ref 5–15)
BUN: 68 mg/dL — ABNORMAL HIGH (ref 6–20)
CO2: 23 mmol/L (ref 22–32)
Calcium: 8.2 mg/dL — ABNORMAL LOW (ref 8.9–10.3)
Chloride: 94 mmol/L — ABNORMAL LOW (ref 98–111)
Creatinine, Ser: 10.09 mg/dL — ABNORMAL HIGH (ref 0.44–1.00)
GFR, Estimated: 4 mL/min — ABNORMAL LOW (ref >60)
Glucose, Bld: 147 mg/dL — ABNORMAL HIGH (ref 70–99)
Potassium: 3.9 mmol/L (ref 3.5–5.1)
Sodium: 132 mmol/L — ABNORMAL LOW (ref 135–145)

## 2021-09-20 LAB — HEPATIC FUNCTION PANEL
ALT: 10 U/L (ref 0–44)
AST: 14 U/L — ABNORMAL LOW (ref 15–41)
Albumin: 2.5 g/dL — ABNORMAL LOW (ref 3.5–5.0)
Alkaline Phosphatase: 45 U/L (ref 38–126)
Bilirubin, Direct: <0.1 mg/dL (ref 0.0–0.2)
Total Bilirubin: 0.5 mg/dL (ref 0.3–1.2)
Total Protein: 6.6 g/dL (ref 6.5–8.1)

## 2021-09-20 LAB — GLUCOSE, CAPILLARY
Glucose-Capillary: 142 mg/dL — ABNORMAL HIGH (ref 70–99)
Glucose-Capillary: 175 mg/dL — ABNORMAL HIGH (ref 70–99)
Glucose-Capillary: 129 mg/dL — ABNORMAL HIGH (ref 70–99)
Glucose-Capillary: 134 mg/dL — ABNORMAL HIGH (ref 70–99)
Glucose-Capillary: 114 mg/dL — ABNORMAL HIGH (ref 70–99)

## 2021-09-20 LAB — PROCALCITONIN: Procalcitonin: 25.89 ng/mL

## 2021-09-20 LAB — MAGNESIUM: Magnesium: 2 mg/dL (ref 1.7–2.4)

## 2021-09-20 MED ORDER — DARBEPOETIN ALFA 60 MCG/0.3ML IJ SOSY
60.0000 ug | PREFILLED_SYRINGE | INTRAMUSCULAR | Status: DC
  Administered 2021-09-21: 60 ug via INTRAVENOUS
  Filled 2021-09-20 (×2): qty 0.3

## 2021-09-20 MED ORDER — CHLORHEXIDINE GLUCONATE CLOTH 2 % EX PADS
6.0000 | MEDICATED_PAD | Freq: Every day | CUTANEOUS | Status: DC
  Administered 2021-09-20 – 2021-09-23 (×4): 6 via TOPICAL

## 2021-09-20 NOTE — Assessment & Plan Note (Signed)
GLucose normal -Continue SS corrections -Continue gabapentin

## 2021-09-20 NOTE — Progress Notes (Signed)
Progress Note    Tammy Moses   DJM:[id number]  DOB: [birth year]  DOA: 09/16/2021     4 Date of Service: 09/20/2021       Brief summary: Tammy Moses is a 55 y.o. F with hx ESRD on HD MWF, DM, HTN, and asthma/COPD hwo presented with 1 week SOB as well as cough, nausea and vomiting.  In the ER, Temp 101F, BNP elevated, UA and CXR unremarkable.        Assessment and Plan * Sepsis ([place]) Unclear source.  Procal up but hard to interpret with renal failure. -Hold antibiotics and monitor fever curve -Repeat procalcitonin - Repeat blood cultures - Obtain right upper quadrant ultrasound  ESRD (end stage renal disease) ([place]) - Consult Nephrology  Essential hypertension -Continue amlodipine, furosemide, hydralazine,   COPD (chronic obstructive pulmonary disease) (HCC) Respiratory failure ruled out. -Continue albuterol PRN  Diabetes mellitus (HCC) GLucose normal -Continue SS corrections -Continue gabapentin  Elevated troponin [facility]     Subjective:  No fever, chest pain, abdominal pain.  She has some vomiting after eating.  No sputum, dyspnea, confusion.  Objective Vitals:   09/19/21 1007 09/19/21 1923 09/20/21 0500 09/20/21 0755  BP: (!) 133/46 (!) 132/53  (!) 129/44  Pulse: 71 72  72  Resp: 17 17  20   Temp: 98.5 F (36.9 C) 98.5 F (36.9 C)  98.2 F (36.8 C)  TempSrc: Oral Oral    SpO2: 98%   97%  Weight:   56.8 kg   Height:       56.8 kg  Vital signs were reviewed and unremarkable.   Exam Physical Exam Constitutional:      General: She is not in acute distress. HENT:     Nose: No nasal deformity or rhinorrhea.     Mouth/Throat:     Lips: Pink. No lesions.     Mouth: Mucous membranes are moist. No oral lesions.     Dentition: Normal dentition.     Pharynx: Oropharynx is clear. No posterior oropharyngeal erythema.  Eyes:     General: Lids are normal.     Extraocular Movements: Extraocular movements intact.     Right eye: No nystagmus.      Left eye: No nystagmus.     Conjunctiva/sclera: Conjunctivae normal.     Right eye: Right conjunctiva is not injected.     Left eye: Left conjunctiva is not injected.     Comments: Amblyopia noted  Cardiovascular:     Rate and Rhythm: Normal rate and regular rhythm.     Pulses:          Radial pulses are 2+ on the right side and 2+ on the left side.     Heart sounds: Normal heart sounds, S1 normal and S2 normal. No murmur heard. Pulmonary:     Effort: Pulmonary effort is normal. No respiratory distress.     Breath sounds: No wheezing or rales.  Abdominal:     General: There is no distension.     Palpations: Abdomen is soft.     Tenderness: There is no abdominal tenderness. There is no guarding or rebound.  Musculoskeletal:     Right lower leg: No edema.     Left lower leg: No edema.  Skin:    General: Skin is warm and dry.     Findings: No lesion or rash.  Neurological:     Mental Status: She is alert and oriented to person, place, and time.  Cranial Nerves: Cranial nerves are intact.     Motor: No weakness.  Psychiatric:        Attention and Perception: Attention normal.        Mood and Affect: Mood and affect normal.        Behavior: Behavior is cooperative.        Cognition and Memory: Memory normal.        Judgment: Judgment normal.       Labs / Other Information My review of labs, imaging, notes and other tests shows no new significant findings.    Disposition Plan: Status is: Inpatient  Remains inpatient appropriate because: The patient has rising procalcitonin, in the setting of sepsis without obvious source, I feel that it would be risking serious morbidity or mortality to discharge the patient at this point.  Will obtain US, repeat cultures, trend procal, and monitor fever curve for 24 hours.  If testing negative tomorrow, likely home with PCP follow up.        Time spent: 25 minutes [facility] 09/20/2021, 5:43 PM

## 2021-09-20 NOTE — Progress Notes (Signed)
    RE:  Tammy Moses       Date of Birth:  2066-01-30     Date:  09/20/21        To Whom It May Concern:  Please be advised that the above-named patient will require a short-term nursing home stay - anticipated 30 days or less for rehabilitation and strengthening.  The plan is for return home.                 MD signature                Date

## 2021-09-20 NOTE — Assessment & Plan Note (Signed)
-  Continue amlodipine, furosemide, hydralazine,

## 2021-09-20 NOTE — NC FL2 (Signed)
[place] LEVEL OF CARE SCREENING TOOL     IDENTIFICATION  Patient Name: Tammy Moses Birthdate: February 22, 1966 Sex: female Admission Date (Current Location): 09/16/2021  Tammy Moses and [place] Number:  Tammy Moses [id number] [place] and Address:  [facility]. Tammy Moses, [place] [address] [place], [place] [address]      Provider Number: [id number]  Attending Physician Name and Address:  Tammy Moses, *  Relative Name and Phone Number:  Tammy Moses Other   [phone]    Current Level of Care: Hospital Recommended Level of Care: Tammy Moses Prior Approval Number:    Date Approved/Denied:   PASRR Number:    Discharge Plan: SNF    Current Diagnoses: Patient Active Problem List   Diagnosis Date Noted   Sepsis ([place]) 09/16/2021   Increased anion gap metabolic acidosis 64/40/3474   Elevated troponin 09/16/2021   Anemia due to chronic kidney disease 09/16/2021   Impaired functional mobility and activity tolerance 01/04/2019   Pneumonia of both lungs due to infectious organism 10/05/2017   Fall    Hypoglycemia 07/12/2017   Anemia 25/95/6387   Alcoholic intoxication without complication ([place])    COPD (chronic obstructive pulmonary disease) ([place]) 06/24/2017   ESRD (end stage renal disease) ([place]) 06/17/2017   Foot drop 05/26/2017   Carpal tunnel syndrome of left wrist 05/20/2017   Ulnar neuropathy of left upper extremity 05/20/2017   Poor compliance with medication 05/17/2017   Peripheral neuropathy 05/10/2017   Diabetes mellitus ([place]) 05/10/2017   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   Vitamin D deficiency 04/07/2017   Absolute glaucoma of right eye 12/27/2016   Chronic angle-closure glaucoma of left eye, severe stage 12/27/2016   PCO (posterior capsule opacification), left 12/27/2016   Gait abnormality 03/07/2014   Abnormality of gait 03/07/2014   Hepatitis C virus infection without hepatic coma 03/04/2014   History of  hepatitis C 03/04/2014   Erosive gastritis 02/14/2014   Pseudophakia of left eye 10/30/2012   Asthma 10/13/2012   Reflux 10/13/2012   Lens replaced by other means 10/12/2012   Primary open angle glaucoma 10/12/2012   BACK PAIN, LUMBAR 08/18/2010   IDDM 08/11/2010   HYPERCHOLESTEROLEMIA 08/11/2010   HYPOKALEMIA 08/11/2010   DEPRESSION 08/11/2010   GLAUCOMA 08/11/2010   BLINDNESS, RIGHT EYE 08/11/2010   Essential hypertension 08/11/2010   GERD 08/11/2010   CONSTIPATION 08/06/2009    Orientation RESPIRATION BLADDER Height & Weight     Self, Time, Situation, Place  Normal Continent Weight: 125 lb 3.5 oz (56.8 kg) Height:  5\' 1"  (154.9 cm)  BEHAVIORAL SYMPTOMS/MOOD NEUROLOGICAL BOWEL NUTRITION STATUS      Incontinent Diet (see discharge summary)  AMBULATORY STATUS COMMUNICATION OF NEEDS Skin   Limited Assist Verbally Normal                       Personal Care Assistance Level of Assistance  Bathing, Feeding, Dressing Bathing Assistance: Limited assistance Feeding assistance: Independent Dressing Assistance: Limited assistance     Functional Limitations Info  Sight, Hearing, Speech Sight Info: Impaired Hearing Info: Adequate Speech Info: Adequate    SPECIAL CARE FACTORS FREQUENCY  PT (By licensed PT), OT (By licensed OT)     PT Frequency: 5x week OT Frequency: 5x week            Contractures Contractures Info: Not present    Additional Factors Info  Code Status, Allergies, Insulin Sliding Scale Code Status Info: full Allergies Info: Acyclovir And Related  Insulin Sliding Scale Info: Novolog, 0-6 units 3x day with meals.  See discharge summary       Current Medications (09/20/2021):  This is the current hospital active medication list Current Facility-Administered Medications  Medication Dose Route Frequency Provider Last Rate Last Admin   0.9 %  sodium chloride infusion  250 mL Intravenous PRN Tammy Flaming, MD       acetaminophen (TYLENOL) tablet  650 mg  650 mg Oral Q6H PRN Tammy Flaming, MD   650 mg at 09/19/21 2300   Or   acetaminophen (TYLENOL) suppository 650 mg  650 mg Rectal Q6H PRN Tammy Flaming, MD       albuterol (PROVENTIL) (2.5 MG/3ML) 0.083% nebulizer solution 2.5 mg  2.5 mg Nebulization Q4H PRN Tammy Julian, Tammy A, MD       amLODipine (NORVASC) tablet 10 mg  10 mg Oral Daily Tammy Moses, Laxman, MD   10 mg at 09/19/21 1011   atropine 1 % ophthalmic solution 1 drop  1 drop Both Eyes BID Tammy Flaming, MD   1 drop at 09/20/21 0909   buPROPion (WELLBUTRIN SR) 12 hr tablet 150 mg  150 mg Oral Daily Tammy Flaming, MD   150 mg at 09/20/21 0859   calcium acetate (PHOSLO) capsule 2,001 mg  2,001 mg Oral TID WC Tammy Flaming, MD   2,001 mg at 09/20/21 0900   Chlorhexidine Gluconate Cloth 2 % PADS 6 each  6 each Topical Q0600 Tammy Parish, MD       Derrill Memo ON 09/21/2021] Darbepoetin Alfa (ARANESP) injection 60 mcg  60 mcg Intravenous Q Mon-HD Tammy Parish, MD       dorzolamide-timolol (COSOPT) 22.3-6.8 MG/ML ophthalmic solution 1 drop  1 drop Both Eyes BID Tammy Flaming, MD   1 drop at 09/20/21 0910   furosemide (LASIX) tablet 40 mg  40 mg Oral Daily Tammy Flaming, MD   40 mg at 09/20/21 8119   gabapentin (NEURONTIN) tablet 600 mg  600 mg Oral BID Tammy Flaming, MD   600 mg at 09/20/21 0901   heparin injection 5,000 Units  5,000 Units Subcutaneous Tammy Peeks, MD   5,000 Units at 09/20/21 1478   hydrALAZINE (APRESOLINE) injection 10 mg  10 mg Intravenous Q6H PRN Tammy Flaming, MD       hydrALAZINE (APRESOLINE) tablet 150 mg  150 mg Oral BID Tammy Moses, Laxman, MD   150 mg at 09/19/21 2300   insulin aspart (novoLOG) injection 0-6 Units  0-6 Units Subcutaneous TID WC Tammy Flaming, MD   1 Units at 09/19/21 1736   lamoTRIgine (LAMICTAL) tablet 25 mg  25 mg Oral Daily Tammy Flaming, MD   25 mg at 09/20/21 0904   latanoprost (XALATAN) 0.005 % ophthalmic solution 1 drop  1 drop Both Eyes QHS Tammy Flaming, MD   1 drop  at 09/19/21 2259   loperamide (IMODIUM) capsule 2 mg  2 mg Oral PRN Tammy Moses, Laxman, MD   2 mg at 09/18/21 1538   nicotine (NICODERM CQ - dosed in mg/24 hours) patch 14 mg  14 mg Transdermal Daily Tammy Flaming, MD   14 mg at 09/20/21 0915   ondansetron (ZOFRAN) tablet 4 mg  4 mg Oral Q6H PRN Tammy Flaming, MD   4 mg at 09/20/21 0859   Or   ondansetron (ZOFRAN) injection 4 mg  4 mg Intravenous Q6H PRN Tammy Flaming, MD       pravastatin (PRAVACHOL) tablet 80 mg  80 mg Oral QHS Tammy Flaming, MD   80  mg at 09/19/21 2300   sodium chloride flush (NS) 0.9 % injection 3 mL  3 mL Intravenous Q12H Tammy Flaming, MD   3 mL at 09/19/21 1017   sodium chloride flush (NS) 0.9 % injection 3 mL  3 mL Intravenous PRN Tammy Flaming, MD   3 mL at 09/16/21 2152   timolol (TIMOPTIC) 0.5 % ophthalmic solution 1 drop  1 drop Left Eye Gerri Lins, MD   1 drop at 09/19/21 2259     Discharge Medications: Please see discharge summary for Moses list of discharge medications.  Relevant Imaging Results:  Relevant Lab Results:   Additional Information SSN: [id number].  HD MWF schedule.  Pt reports she is vaccinated for covid with one booster.  Joanne Chars, LCSW

## 2021-09-20 NOTE — Assessment & Plan Note (Signed)
-   Consult Nephrology 

## 2021-09-20 NOTE — Assessment & Plan Note (Signed)
[facility]

## 2021-09-20 NOTE — Hospital Course (Addendum)
Tammy Moses is a 55 y.o. F with hx ESRD on HD MWF, DM, HTN, and asthma/COPD who presented with 1 week SOB as well as cough, nausea and vomiting.  In the ER, Temp 101F, BNP elevated, UA and CXR unremarkable.  11/23: admitted and started on empiric antibiotics 11/26: cultures all negative, no obvious source, antibiotics stopped  11/27-11/29: asymptomatic, at baseline, no further fever or leukocytosis, repeat cultures negative, however, patient with new foot pain without swelling, ?neuropathy, muscle strain? Preventing patient from walking

## 2021-09-20 NOTE — TOC Initial Note (Signed)
Transition of Care [facility]) - Initial/Assessment Note    Patient Details  Name: Tammy Moses MRN: [id number] Date of Birth: [birth year]  Transition of Care [facility]) CM/SW Contact:    Joanne Chars, LCSW Phone Number: 09/20/2021, 10:56 AM  Clinical Narrative: CSW met with pt regarding DC recommendation for SNF.  Pt agreeable to SNF, choice document given, permission given to send out referral in hub.  Pt lives alone in an apartment but has home Health aide through [facility] care every day, 3 hours per day.  Permission given so speak with niece Hulan Fray (not sure on spelling, no listed on face sheet)  Pt reports she is vaccinated for covid with one booster.                    Expected Discharge Plan: Skilled Nursing Facility Barriers to Discharge: Continued Medical Work up, SNF Pending bed offer   Patient Goals and CMS Choice Patient states their goals for this hospitalization and ongoing recovery are:: "go home" CMS Medicare.gov Compare Post Acute Care list provided to:: Patient Choice offered to / list presented to : Patient  Expected Discharge Plan and Services Expected Discharge Plan: [place] In-house Referral: Clinical Social Work   Post Acute Care Choice: [place] Living arrangements for the past 2 months: Apartment                                      Prior Living Arrangements/Services Living arrangements for the past 2 months: Apartment Lives with:: Self Patient language and need for interpreter reviewed:: Yes Do you feel safe going back to the place where you live?: Yes      Need for Family Participation in Patient Care: No (Comment) Care giver support system in place?: Yes (comment) Current home services: Homehealth aide ([place]) Criminal Activity/Legal Involvement Pertinent to Current Situation/Hospitalization: No - Comment as needed  Activities of Daily Living Home Assistive Devices/Equipment: Cane  (specify quad or straight) ADL Screening (condition at time of admission) Patient's cognitive ability adequate to safely complete daily activities?: Yes Is the patient deaf or have difficulty hearing?: No Does the patient have difficulty seeing, even when wearing glasses/contacts?: Yes Does the patient have difficulty concentrating, remembering, or making decisions?: No Patient able to express need for assistance with ADLs?: Yes Does the patient have difficulty dressing or bathing?: Yes Independently performs ADLs?: No Communication: Independent Dressing (OT): Needs assistance Is this a change from baseline?: Pre-admission baseline Grooming: Needs assistance Is this a change from baseline?: Pre-admission baseline Feeding: Appropriate for developmental age (patient is blind) Bathing: Needs assistance Is this a change from baseline?: Pre-admission baseline Toileting: Needs assistance Is this a change from baseline?: Pre-admission baseline In/Out Bed: Needs assistance Is this a change from baseline?: Pre-admission baseline Walks in Home: Needs assistance Is this a change from baseline?: Pre-admission baseline Does the patient have difficulty walking or climbing stairs?: Yes Weakness of Legs: Right Weakness of Arms/Hands: None  Permission Sought/Granted Permission sought to share information with : Family Supports, Chartered certified accountant granted to share information with : Yes, Verbal Permission Granted  Share Information with NAME: Niece Hulan Fray. (not listed in contacts)  Permission granted to share info w AGENCY: SNF        Emotional Assessment Appearance:: Appears stated age Attitude/Demeanor/Rapport: Engaged Affect (typically observed): Appropriate, Pleasant Orientation: :  (not recorded, appears oriented) Alcohol /  Substance Use: Not Applicable Psych Involvement: No (comment)  Admission diagnosis:  SIRS (systemic inflammatory response syndrome) (HCC)  [R65.10] Sepsis ([place]) [A41.9] Fever, unspecified fever cause [R50.9] Patient Active Problem List   Diagnosis Date Noted   Sepsis ([place]) 09/16/2021   Increased anion gap metabolic acidosis 18/55/0158   Elevated troponin 09/16/2021   Anemia due to chronic kidney disease 09/16/2021   Impaired functional mobility and activity tolerance 01/04/2019   Pneumonia of both lungs due to infectious organism 10/05/2017   Fall    Hypoglycemia 07/12/2017   Anemia 68/25/7493   Alcoholic intoxication without complication ([place])    COPD (chronic obstructive pulmonary disease) ([place]) 06/24/2017   ESRD (end stage renal disease) ([place]) 06/17/2017   Foot drop 05/26/2017   Carpal tunnel syndrome of left wrist 05/20/2017   Ulnar neuropathy of left upper extremity 05/20/2017   Poor compliance with medication 05/17/2017   Peripheral neuropathy 05/10/2017   Diabetes mellitus ([place]) 05/10/2017   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   Vitamin D deficiency 04/07/2017   Absolute glaucoma of right eye 12/27/2016   Chronic angle-closure glaucoma of left eye, severe stage 12/27/2016   PCO (posterior capsule opacification), left 12/27/2016   Gait abnormality 03/07/2014   Abnormality of gait 03/07/2014   Hepatitis C virus infection without hepatic coma 03/04/2014   History of hepatitis C 03/04/2014   Erosive gastritis 02/14/2014   Pseudophakia of left eye 10/30/2012   Asthma 10/13/2012   Reflux 10/13/2012   Lens replaced by other means 10/12/2012   Primary open angle glaucoma 10/12/2012   BACK PAIN, LUMBAR 08/18/2010   IDDM 08/11/2010   HYPERCHOLESTEROLEMIA 08/11/2010   HYPOKALEMIA 08/11/2010   DEPRESSION 08/11/2010   GLAUCOMA 08/11/2010   BLINDNESS, RIGHT EYE 08/11/2010   Essential hypertension 08/11/2010   GERD 08/11/2010   CONSTIPATION 08/06/2009   PCP:  [name], FNP (Inactive) Pharmacy:   [place], [place] - [address] [place]  [address] Phone: [phone] Fax: [phone]  [facility] [phone] - [place], [place] - [phone] AT [address] [place] [address] Phone: [phone] Fax: [phone]     Social Determinants of Health (SDOH) Interventions    Readmission Risk Interventions No flowsheet data found.

## 2021-09-20 NOTE — NC FL2 (Deleted)
[place] LEVEL OF CARE SCREENING TOOL     IDENTIFICATION  Patient Name: Tammy Moses Birthdate: 12-30-1965 Sex: female Admission Date (Current Location): 09/16/2021  [place] and [place] Number:  [place] [id number] [place] and Address:  The . [facility], [place] [address] [place], [place] [address]      Provider Number: [id number]  Attending Physician Name and Address:  Edwin Dada, *  Relative Name and Phone Number:  Arma Heading Other   [phone]    Current Level of Care: Hospital Recommended Level of Care: [place] Prior Approval Number:    Date Approved/Denied:   PASRR Number:    Discharge Plan: SNF    Current Diagnoses: Patient Active Problem List   Diagnosis Date Noted   Sepsis ([place]) 09/16/2021   Increased anion gap metabolic acidosis 31/49/7026   Elevated troponin 09/16/2021   Anemia due to chronic kidney disease 09/16/2021   Impaired functional mobility and activity tolerance 01/04/2019   Pneumonia of both lungs due to infectious organism 10/05/2017   Fall    Hypoglycemia 07/12/2017   Anemia 37/85/8850   Alcoholic intoxication without complication ([place])    COPD (chronic obstructive pulmonary disease) ([place]) 06/24/2017   ESRD (end stage renal disease) ([place]) 06/17/2017   Foot drop 05/26/2017   Carpal tunnel syndrome of left wrist 05/20/2017   Ulnar neuropathy of left upper extremity 05/20/2017   Poor compliance with medication 05/17/2017   Peripheral neuropathy 05/10/2017   Diabetes mellitus ([place]) 05/10/2017   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   Vitamin D deficiency 04/07/2017   Absolute glaucoma of right eye 12/27/2016   Chronic angle-closure glaucoma of left eye, severe stage 12/27/2016   PCO (posterior capsule opacification), left 12/27/2016   Gait abnormality 03/07/2014   Abnormality of gait 03/07/2014   Hepatitis C virus infection without hepatic coma 03/04/2014   History of  hepatitis C 03/04/2014   Erosive gastritis 02/14/2014   Pseudophakia of left eye 10/30/2012   Asthma 10/13/2012   Reflux 10/13/2012   Lens replaced by other means 10/12/2012   Primary open angle glaucoma 10/12/2012   BACK PAIN, LUMBAR 08/18/2010   IDDM 08/11/2010   HYPERCHOLESTEROLEMIA 08/11/2010   HYPOKALEMIA 08/11/2010   DEPRESSION 08/11/2010   GLAUCOMA 08/11/2010   BLINDNESS, RIGHT EYE 08/11/2010   Essential hypertension 08/11/2010   GERD 08/11/2010   CONSTIPATION 08/06/2009    Orientation RESPIRATION BLADDER Height & Weight     Self, Time, Situation, Place  Normal Continent Weight: 125 lb 3.5 oz (56.8 kg) Height:  5\' 1"  (154.9 cm)  BEHAVIORAL SYMPTOMS/MOOD NEUROLOGICAL BOWEL NUTRITION STATUS      Incontinent Diet (see discharge summary)  AMBULATORY STATUS COMMUNICATION OF NEEDS Skin   Limited Assist Verbally Normal                       Personal Care Assistance Level of Assistance  Bathing, Feeding, Dressing Bathing Assistance: Limited assistance Feeding assistance: Independent Dressing Assistance: Limited assistance     Functional Limitations Info  Sight, Hearing, Speech Sight Info: Adequate Hearing Info: Adequate Speech Info: Adequate    SPECIAL CARE FACTORS FREQUENCY  PT (By licensed PT), OT (By licensed OT)     PT Frequency: 5x week OT Frequency: 5x week            Contractures Contractures Info: Not present    Additional Factors Info  Code Status, Allergies, Insulin Sliding Scale Code Status Info: full Allergies Info: Acyclovir And Related  Insulin Sliding Scale Info: Novolog, 0-6 units 3x day with meals.  See discharge summary       Current Medications (09/20/2021):  This is the current hospital active medication list Current Facility-Administered Medications  Medication Dose Route Frequency Provider Last Rate Last Admin   0.9 %  sodium chloride infusion  250 mL Intravenous PRN Orma Flaming, MD       acetaminophen (TYLENOL) tablet  650 mg  650 mg Oral Q6H PRN Orma Flaming, MD   650 mg at 09/19/21 2300   Or   acetaminophen (TYLENOL) suppository 650 mg  650 mg Rectal Q6H PRN Orma Flaming, MD       albuterol (PROVENTIL) (2.5 MG/3ML) 0.083% nebulizer solution 2.5 mg  2.5 mg Nebulization Q4H PRN Tamala Julian, Rondell A, MD       amLODipine (NORVASC) tablet 10 mg  10 mg Oral Daily Pokhrel, Laxman, MD   10 mg at 09/19/21 1011   atropine 1 % ophthalmic solution 1 drop  1 drop Both Eyes BID Orma Flaming, MD   1 drop at 09/20/21 0909   buPROPion (WELLBUTRIN SR) 12 hr tablet 150 mg  150 mg Oral Daily Orma Flaming, MD   150 mg at 09/20/21 0859   calcium acetate (PHOSLO) capsule 2,001 mg  2,001 mg Oral TID WC Orma Flaming, MD   2,001 mg at 09/20/21 0900   Chlorhexidine Gluconate Cloth 2 % PADS 6 each  6 each Topical Q0600 Corliss Parish, MD       Derrill Memo ON 09/21/2021] Darbepoetin Alfa (ARANESP) injection 60 mcg  60 mcg Intravenous Q Mon-HD Corliss Parish, MD       dorzolamide-timolol (COSOPT) 22.3-6.8 MG/ML ophthalmic solution 1 drop  1 drop Both Eyes BID Orma Flaming, MD   1 drop at 09/20/21 0910   furosemide (LASIX) tablet 40 mg  40 mg Oral Daily Orma Flaming, MD   40 mg at 09/20/21 1610   gabapentin (NEURONTIN) tablet 600 mg  600 mg Oral BID Orma Flaming, MD   600 mg at 09/20/21 0901   heparin injection 5,000 Units  5,000 Units Subcutaneous Jetty Peeks, MD   5,000 Units at 09/20/21 9604   hydrALAZINE (APRESOLINE) injection 10 mg  10 mg Intravenous Q6H PRN Orma Flaming, MD       hydrALAZINE (APRESOLINE) tablet 150 mg  150 mg Oral BID Pokhrel, Laxman, MD   150 mg at 09/19/21 2300   insulin aspart (novoLOG) injection 0-6 Units  0-6 Units Subcutaneous TID WC Orma Flaming, MD   1 Units at 09/19/21 1736   lamoTRIgine (LAMICTAL) tablet 25 mg  25 mg Oral Daily Orma Flaming, MD   25 mg at 09/20/21 0904   latanoprost (XALATAN) 0.005 % ophthalmic solution 1 drop  1 drop Both Eyes QHS Orma Flaming, MD   1 drop  at 09/19/21 2259   loperamide (IMODIUM) capsule 2 mg  2 mg Oral PRN Pokhrel, Laxman, MD   2 mg at 09/18/21 1538   nicotine (NICODERM CQ - dosed in mg/24 hours) patch 14 mg  14 mg Transdermal Daily Orma Flaming, MD   14 mg at 09/20/21 0915   ondansetron (ZOFRAN) tablet 4 mg  4 mg Oral Q6H PRN Orma Flaming, MD   4 mg at 09/20/21 0859   Or   ondansetron (ZOFRAN) injection 4 mg  4 mg Intravenous Q6H PRN Orma Flaming, MD       pravastatin (PRAVACHOL) tablet 80 mg  80 mg Oral QHS Orma Flaming, MD   80  mg at 09/19/21 2300   sodium chloride flush (NS) 0.9 % injection 3 mL  3 mL Intravenous Q12H Orma Flaming, MD   3 mL at 09/19/21 1017   sodium chloride flush (NS) 0.9 % injection 3 mL  3 mL Intravenous PRN Orma Flaming, MD   3 mL at 09/16/21 2152   timolol (TIMOPTIC) 0.5 % ophthalmic solution 1 drop  1 drop Left Eye Gerri Lins, MD   1 drop at 09/19/21 2259     Discharge Medications: Please see discharge summary for a list of discharge medications.  Relevant Imaging Results:  Relevant Lab Results:   Additional Information SSN: [id number].  HD MWF schedule.  Pt reports she is vaccinated for covid with one booster.  Joanne Chars, LCSW

## 2021-09-20 NOTE — Evaluation (Signed)
Occupational Therapy Evaluation Patient Details Name: Tammy Moses MRN: [id number] DOB: 1966/02/27 Today's Date: 09/20/2021   History of Present Illness 55 y/o presented to ED on 11/23 for 1 week history of SOB and cough with nausea/vomiting. Also has missed HD for unknown time, told nephrology 11/16. Chest x-ray showed no obvious infiltrate. PMH: diabetes, ESRD on HD, diabetic neuropathy, diabetic retinopathy, HTN, tobacco dependence, blind in R eye, COPD/asthma   Clinical Impression   Pt admitted for concern listed above. PTA pt reported that she was independent with all ADL's and functional mobility. Additionally pt states that she has an aide that comes to her home multiple times a week for 3 hours from 3am-6am to assist with IADL's. Pt presents with poor cognition, increased pain, weakness, balance deficits, and decreased activity tolerance. Pt requiring min-Max Assist for all ADL's and functional mobility. She is unable to tolerate/remain upright to complete a full step pivot transfer to a BSC or recliner this session. Her RLE continually "gives out" on her. OT will continue to follow acutely and recommends SNF level therapies to maximize her independence and safety.      Recommendations for follow up therapy are one component of a multi-disciplinary discharge planning process, led by the attending physician.  Recommendations may be updated based on patient status, additional functional criteria and insurance authorization.   Follow Up Recommendations  Skilled nursing-short term rehab (<3 hours/day)    Assistance Recommended at Discharge Frequent or constant Supervision/Assistance  Functional Status Assessment  Patient has had a recent decline in their functional status and demonstrates the ability to make significant improvements in function in a reasonable and predictable amount of time.  Equipment Recommendations  Other (comment) (TBD)    Recommendations for Other Services        Precautions / Restrictions Precautions Precautions: Fall Precaution Comments: blind, R foot drop Restrictions Weight Bearing Restrictions: No      Mobility Bed Mobility Overal bed mobility: Needs Assistance Bed Mobility: Supine to Sit;Sit to Supine     Supine to sit: Supervision Sit to supine: Supervision   General bed mobility comments: supervision for safety due to impulsivity    Transfers Overall transfer level: Needs assistance Equipment used: Straight cane Transfers: Sit to/from Stand Sit to Stand: Mod assist           General transfer comment: Mod A due to increased assist to power up and increased time and assist to steady.      Balance Overall balance assessment: Needs assistance Sitting-balance support: No upper extremity supported;Feet supported Sitting balance-Leahy Scale: Fair     Standing balance support: Bilateral upper extremity supported;During functional activity Standing balance-Leahy Scale: Poor Standing balance comment: reliant on UE support and external assist                           ADL either performed or assessed with clinical judgement   ADL Overall ADL's : Needs assistance/impaired Eating/Feeding: Minimal assistance;Sitting   Grooming: Minimal assistance;Sitting   Upper Body Bathing: Minimal assistance;Sitting   Lower Body Bathing: Moderate assistance;Sitting/lateral leans;Sit to/from stand   Upper Body Dressing : Minimal assistance;Sitting   Lower Body Dressing: Moderate assistance;Sitting/lateral leans;Sit to/from stand   Toilet Transfer: Moderate assistance;Stand-pivot   Toileting- Clothing Manipulation and Hygiene: Moderate assistance;Sit to/from stand;Sitting/lateral lean       Functional mobility during ADLs: Moderate assistance General ADL Comments: Pt very weak and unsteady, requiring hands on assist, refusing RW, very poor insight  Vision Baseline Vision/History: 2 Legally blind Ability to See  in Adequate Light: 4 Severely impaired Patient Visual Report: No change from baseline Vision Assessment?: Vision impaired- to be further tested in functional context Additional Comments: 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 and only sees shadows in her L     Perception     Praxis      Pertinent Vitals/Pain Pain Assessment: Faces Faces Pain Scale: Hurts a little bit Pain Location: Back Pain Descriptors / Indicators: Aching;Discomfort Pain Intervention(s): Monitored during session;Repositioned     Hand Dominance Right   Extremity/Trunk Assessment Upper Extremity Assessment Upper Extremity Assessment: Generalized weakness   Lower Extremity Assessment Lower Extremity Assessment: Defer to PT evaluation   Cervical / Trunk Assessment Cervical / Trunk Assessment: Kyphotic   Communication Communication Communication: No difficulties   Cognition Arousal/Alertness: Awake/alert Behavior During Therapy: Impulsive Overall Cognitive Status: No family/caregiver present to determine baseline cognitive functioning                                 General Comments: patient impulsive and with poor safety awareness. patient aware that she may fall at home but refuses SNF. No family present. Tangential conversations.     General Comments  Pt with very poor insight to safety    Exercises     Shoulder Instructions      Home Living Family/patient expects to be discharged to:: Private residence Living Arrangements: Alone Available Help at Discharge: Personal care attendant Type of Home: Apartment Home Access: Level entry     Home Layout: One level     Bathroom Shower/Tub: Teacher, early years/pre: Standard     Home Equipment: Rollator (4 wheels);Cane - single point          Prior Functioning/Environment Prior Level of Function : Independent/Modified Independent;History of Falls (last six months)             Mobility Comments: reports 4-5 falls in  past 6 months. Suspect more based off presentation ADLs Comments: has PCA 2-3 hours/day and comes 3 AM MWF        OT Problem List: Decreased strength;Decreased activity tolerance;Impaired balance (sitting and/or standing);Decreased coordination;Decreased cognition;Decreased safety awareness;Decreased knowledge of use of DME or AE      OT Treatment/Interventions: Self-care/ADL training;Therapeutic exercise;Energy conservation;DME and/or AE instruction;Therapeutic activities;Cognitive remediation/compensation;Patient/family education;Balance training    OT Goals(Current goals can be found in the care plan section) Acute Rehab OT Goals Patient Stated Goal: To go home OT Goal Formulation: With patient Time For Goal Achievement: 10/04/21 Potential to Achieve Goals: Good ADL Goals Pt Will Perform Lower Body Bathing: with min guard assist;sitting/lateral leans;sit to/from stand Pt Will Perform Lower Body Dressing: with min guard assist;sitting/lateral leans;sit to/from stand Pt Will Transfer to Toilet: with min guard assist;ambulating Pt Will Perform Toileting - Clothing Manipulation and hygiene: with min guard assist;sitting/lateral leans;sit to/from stand Additional ADL Goal #1: Pt will follow simple commands 100% of the time with no cuing.  OT Frequency: Min 2X/week   Barriers to D/C:    Pt lives alone       Co-evaluation              AM-PAC OT "6 Clicks" Daily Activity     Outcome Measure Help from another person eating meals?: A Little Help from another person taking care of personal grooming?: A Little Help from another person toileting, which includes using toliet, bedpan,  or urinal?: A Lot Help from another person bathing (including washing, rinsing, drying)?: A Lot Help from another person to put on and taking off regular upper body clothing?: A Little Help from another person to put on and taking off regular lower body clothing?: A Lot 6 Click Score: 15   End of  Session Equipment Utilized During Treatment: Gait belt Nurse Communication: Mobility status  Activity Tolerance: Patient tolerated treatment well Patient left: in bed;with call bell/phone within reach;with bed alarm set  OT Visit Diagnosis: Unsteadiness on feet (R26.81);Other abnormalities of gait and mobility (R26.89);Muscle weakness (generalized) (M62.81)                Time: 9233-0076 OT Time Calculation (min): 36 min Charges:  OT General Charges $OT Visit: 1 Visit OT Evaluation $OT Eval Moderate Complexity: 1 Mod OT Treatments $Self Care/Home Management : 8-22 mins  Blondell Laperle H., OTR/L Acute Rehabilitation  [place] Bearden Elane Yolanda Bonine 09/20/2021, 4:38 PM

## 2021-09-20 NOTE — Progress Notes (Signed)
Pt began vomiting and stopped, was given Zofran and morning meds but Hydralazine and amlodipine. Alerted Dr. Loleta Books and made aware of vomiting.

## 2021-09-20 NOTE — Assessment & Plan Note (Signed)
Unclear source.  Procal up but hard to interpret with renal failure. -Hold antibiotics and monitor fever curve

## 2021-09-20 NOTE — Progress Notes (Signed)
[facility] Progress Note   Subjective: Now looks like placement is being pursued -  is asking about getting her kidneys stronger ( on hd for 4 years? )  and about transplant-  300 of urine on lasix  Objective Vitals:   09/19/21 1007 09/19/21 1923 09/20/21 0500 09/20/21 0755  BP: (!) 133/46 (!) 132/53  (!) 129/44  Pulse: 71 72  72  Resp: 17 17  20   Temp: 98.5 F (36.9 C) 98.5 F (36.9 C)  98.2 F (36.8 C)  TempSrc: Oral Oral    SpO2: 98%   97%  Weight:   56.8 kg   Height:       Physical Exam General: chronically ill appearing female in NAD Heart: S1,S2 RRR Lungs: CTAB Abdomen: NABS, NT Extremities: No LE edema Dialysis Access: L AVF + T/B   Additional Objective Labs: Basic Metabolic Panel: Recent Labs  Lab 09/17/21 0212 09/18/21 1538 09/20/21 0044  NA 134* 133* 132*  K 5.2* 3.4* 3.9  CL 101 97* 94*  CO2 13* 23 23  GLUCOSE 104* 157* 147*  BUN 87* 33* 68*  CREATININE 15.61* 7.63* 10.09*  CALCIUM 8.4* 8.6* 8.2*  PHOS 11.5* 4.2  --    Liver Function Tests: Recent Labs  Lab 09/16/21 1242 09/17/21 0212 09/18/21 1538  AST 13*  --   --   ALT 10  --   --   ALKPHOS 62  --   --   BILITOT 0.6  --   --   PROT 7.7  --   --   ALBUMIN 3.1* 2.8* 2.4*   No results for input(s): LIPASE, AMYLASE in the last 168 hours. CBC: Recent Labs  Lab 09/16/21 1242 09/17/21 0212 09/18/21 1538 09/20/21 0044  WBC 11.6* 11.7* 9.7 8.6  NEUTROABS 9.9*  --   --   --   HGB 10.1* 10.4* 10.7* 9.4*  HCT 31.9* 32.5* 31.9* 29.1*  MCV 91.7 91.0 86.7 89.3  PLT 354 332 316 339   Blood Culture    Component Value Date/Time   SDES BLOOD RIGHT HAND 09/16/2021 2029   SPECREQUEST  09/16/2021 2029    BOTTLES DRAWN AEROBIC AND ANAEROBIC Blood Culture adequate volume   CULT  09/16/2021 2029    NO GROWTH 4 DAYS Performed at [facility] [address], [place], [place] [address]    REPTSTATUS PENDING 09/16/2021 2029    Cardiac Enzymes: No results for input(s):  CKTOTAL, CKMB, CKMBINDEX, TROPONINI in the last 168 hours. CBG: Recent Labs  Lab 09/19/21 0826 09/19/21 1245 09/19/21 1647 09/19/21 2107 09/20/21 0752  GLUCAP 115* 137* 164* 125* 142*   Iron Studies: No results for input(s): IRON, TIBC, TRANSFERRIN, FERRITIN in the last 72 hours. @lablastinr3 @ Studies/Results: ECHOCARDIOGRAM COMPLETE  Result Date: 09/18/2021    ECHOCARDIOGRAM REPORT   Patient Name:   Tammy Moses Date of Exam: 09/18/2021 Medical Rec #:  [id number]      Height:       61.0 in Accession #:    [id number]     Weight:       122.4 lb Date of Birth:  [birth year]      BSA:          1.533 m Patient Age:    55 years       BP:           160/66 mmHg Patient Gender: F              HR:  81 bpm. Exam Location:  Inpatient Procedure: 2D Echo, Cardiac Doppler and Color Doppler Indications:    CHF-Acute Systolic  History:        Patient has no prior history of Echocardiogram examinations.                 COPD; Risk Factors:Hypertension and Diabetes.  Sonographer:    Wenda Low Referring Phys: [id number] [place]  1. Left ventricular ejection fraction, by estimation, is 60 to 65%. The left ventricle has normal function. The left ventricle has no regional wall motion abnormalities. There is moderate left ventricular hypertrophy. Left ventricular diastolic parameters are indeterminate.  2. Right ventricular systolic function is normal. The right ventricular size is normal. There is mildly elevated pulmonary artery systolic pressure.  3. The mitral valve is degenerative. Trivial mitral valve regurgitation. No evidence of mitral stenosis. Moderate mitral annular calcification.  4. The aortic valve is tricuspid. There is moderate calcification of the aortic valve. There is moderate thickening of the aortic valve. Aortic valve regurgitation is not visualized. Mild aortic valve stenosis.  5. The inferior vena cava is normal in size with greater than 50% respiratory variability,  suggesting right atrial pressure of 3 mmHg. FINDINGS  Left Ventricle: Left ventricular ejection fraction, by estimation, is 60 to 65%. The left ventricle has normal function. The left ventricle has no regional wall motion abnormalities. The left ventricular internal cavity size was normal in size. There is  moderate left ventricular hypertrophy. Left ventricular diastolic parameters are indeterminate. Right Ventricle: The right ventricular size is normal. No increase in right ventricular wall thickness. Right ventricular systolic function is normal. There is mildly elevated pulmonary artery systolic pressure. The tricuspid regurgitant velocity is 2.96  m/s, and with an assumed right atrial pressure of 8 mmHg, the estimated right ventricular systolic pressure is 61.6 mmHg. Left Atrium: Left atrial size was normal in size. Right Atrium: Right atrial size was normal in size. Pericardium: There is no evidence of pericardial effusion. Mitral Valve: The mitral valve is degenerative in appearance. There is mild thickening of the mitral valve leaflet(s). There is mild calcification of the mitral valve leaflet(s). Moderate mitral annular calcification. Trivial mitral valve regurgitation. No evidence of mitral valve stenosis. MV peak gradient, 10.8 mmHg. The mean mitral valve gradient is 4.0 mmHg. Tricuspid Valve: The tricuspid valve is normal in structure. Tricuspid valve regurgitation is mild . No evidence of tricuspid stenosis. Aortic Valve: The aortic valve is tricuspid. There is moderate calcification of the aortic valve. There is moderate thickening of the aortic valve. Aortic valve regurgitation is not visualized. Mild aortic stenosis is present. Aortic valve mean gradient measures 10.5 mmHg. Aortic valve peak gradient measures 20.2 mmHg. Aortic valve area, by VTI measures 1.65 cm. Pulmonic Valve: The pulmonic valve was normal in structure. Pulmonic valve regurgitation is not visualized. No evidence of pulmonic  stenosis. Aorta: The aortic root is normal in size and structure. Venous: The inferior vena cava is normal in size with greater than 50% respiratory variability, suggesting right atrial pressure of 3 mmHg. IAS/Shunts: No atrial level shunt detected by color flow Doppler.  LEFT VENTRICLE PLAX 2D LVIDd:         4.80 cm   Diastology LVIDs:         2.90 cm   LV e' medial:    4.68 cm/s LV PW:         1.30 cm   LV E/e' medial:  25.6 LV IVS:  1.50 cm   LV e' lateral:   6.42 cm/s LVOT diam:     1.90 cm   LV E/e' lateral: 18.7 LV SV:         63 LV SV Index:   41 LVOT Area:     2.84 cm  RIGHT VENTRICLE RV Basal diam:  3.15 cm RV Mid diam:    2.60 cm RV S prime:     11.70 cm/s TAPSE (M-mode): 2.0 cm LEFT ATRIUM           Index        RIGHT ATRIUM           Index LA diam:      3.60 cm 2.35 cm/m   RA Area:     15.60 cm LA Vol (A2C): 37.8 ml 24.66 ml/m  RA Volume:   40.00 ml  26.10 ml/m LA Vol (A4C): 54.2 ml 35.36 ml/m  AORTIC VALVE                     PULMONIC VALVE AV Area (Vmax):    1.65 cm      PV Vmax:       0.98 m/s AV Area (Vmean):   1.67 cm      PV Peak grad:  3.9 mmHg AV Area (VTI):     1.65 cm AV Vmax:           224.50 cm/s AV Vmean:          151.000 cm/s AV VTI:            0.380 m AV Peak Grad:      20.2 mmHg AV Mean Grad:      10.5 mmHg LVOT Vmax:         131.00 cm/s LVOT Vmean:        89.000 cm/s LVOT VTI:          0.221 m LVOT/AV VTI ratio: 0.58  AORTA Ao Root diam: 2.80 cm MITRAL VALVE                TRICUSPID VALVE MV Area (PHT): 2.95 cm     TR Peak grad:   35.0 mmHg MV Area VTI:   1.55 cm     TR Vmax:        296.00 cm/s MV Peak grad:  10.8 mmHg MV Mean grad:  4.0 mmHg     SHUNTS MV Vmax:       1.64 m/s     Systemic VTI:  0.22 m MV Vmean:      96.0 cm/s    Systemic Diam: 1.90 cm MV Decel Time: 257 msec MV E velocity: 120.00 cm/s MV A velocity: 134.00 cm/s MV E/A ratio:  0.90 Jenkins Rouge MD Electronically signed by Jenkins Rouge MD Signature Date/Time: 09/18/2021/3:06:24 PM    Final     Medications:  sodium chloride      amLODipine  10 mg Oral Daily   atropine  1 drop Both Eyes BID   buPROPion  150 mg Oral Daily   calcium acetate  2,001 mg Oral TID WC   Chlorhexidine Gluconate Cloth  6 each Topical Q0600   Chlorhexidine Gluconate Cloth  6 each Topical Q0600   dorzolamide-timolol  1 drop Both Eyes BID   furosemide  40 mg Oral Daily   gabapentin  600 mg Oral BID   heparin  5,000 Units Subcutaneous Q8H   hydrALAZINE  150 mg Oral BID   insulin aspart  0-6 Units Subcutaneous TID WC   lamoTRIgine  25 mg Oral Daily   latanoprost  1 drop Both Eyes QHS   nicotine  14 mg Transdermal Daily   pravastatin  80 mg Oral QHS   sodium chloride flush  3 mL Intravenous Q12H   timolol  1 drop Left Eye QHS     Dialysis Orders: [facility]  MWF- no orders for review   Assessment/Plan: 1 hypoxia - interesting that CXR is clear- Extra volume removed with HD - HD 11/24 and 11/25. Now on RA.  2. SIRS/Sepsis. No obvious source. ABX per primary have been stopped  3 ESRD: [facility] MWF. [facility] from [place]-  was done on 11/24 and 11/25 Next HD 09/21/2021  4 Hypertension/Volume: BP better controlled. Amlodipine and hydralazine resumed. Post wt 55.5 kg 09/18/2021. Dont know her EDW.  Makes some urine-  on lasix-  continue  5. Anemia of ESRD: Hg 10.7----> 9.4. No ESA needed yet but will resume with HD tomorrow  6. Metabolic Bone Disease: calcium wnl. Phos 11.5 on admit. Now at goal.  Resumed calcium acetate.  7. Metabolic acidosis- Resolved with HD.  8. Dispo-  now off of O2-  I think is probably medically safe for discharge-  now possibly looking at placement-  she is not entirely sure but is staying so far.  I told her that she is ESRD-  needs to continue with dialysis-  there is nothing that will bring her kidneys back.  In order to be a transplant candidate would need to stop smoking and be more compliant-  there seem to be social issues as well.  I am sure this has been explained to her in the  past    Corliss Parish, MD 09/20/2021

## 2021-09-20 NOTE — Assessment & Plan Note (Addendum)
Respiratory failure ruled out. -Continue albuterol PRN

## 2021-09-20 NOTE — Plan of Care (Signed)
  Problem: Education: Goal: Knowledge of General Education information will improve Description: Including pain rating scale, medication(s)/side effects and non-pharmacologic comfort measures Outcome: Progressing   Problem: Safety: Goal: Ability to remain free from injury will improve Outcome: Progressing   Problem: Skin Integrity: Goal: Risk for impaired skin integrity will decrease Outcome: Progressing   

## 2021-09-21 ENCOUNTER — Inpatient Hospital Stay (HOSPITAL_COMMUNITY): Payer: Medicare Other

## 2021-09-21 DIAGNOSIS — A419 Sepsis, unspecified organism: Secondary | ICD-10-CM | POA: Diagnosis not present

## 2021-09-21 LAB — COMPREHENSIVE METABOLIC PANEL
ALT: 12 U/L (ref 0–44)
AST: 23 U/L (ref 15–41)
Albumin: 2.5 g/dL — ABNORMAL LOW (ref 3.5–5.0)
Alkaline Phosphatase: 44 U/L (ref 38–126)
Anion gap: 16 — ABNORMAL HIGH (ref 5–15)
BUN: 72 mg/dL — ABNORMAL HIGH (ref 6–20)
CO2: 19 mmol/L — ABNORMAL LOW (ref 22–32)
Calcium: 8.8 mg/dL — ABNORMAL LOW (ref 8.9–10.3)
Chloride: 89 mmol/L — ABNORMAL LOW (ref 98–111)
Creatinine, Ser: 10.56 mg/dL — ABNORMAL HIGH (ref 0.44–1.00)
GFR, Estimated: 4 mL/min — ABNORMAL LOW (ref >60)
Glucose, Bld: 110 mg/dL — ABNORMAL HIGH (ref 70–99)
Potassium: 3.9 mmol/L (ref 3.5–5.1)
Sodium: 124 mmol/L — ABNORMAL LOW (ref 135–145)
Total Bilirubin: 0.4 mg/dL (ref 0.3–1.2)
Total Protein: 6.7 g/dL (ref 6.5–8.1)

## 2021-09-21 LAB — CBC
HCT: 28.7 % — ABNORMAL LOW (ref 36.0–46.0)
Hemoglobin: 9.4 g/dL — ABNORMAL LOW (ref 12.0–15.0)
MCH: 28.9 pg (ref 26.0–34.0)
MCHC: 32.8 g/dL (ref 30.0–36.0)
MCV: 88.3 fL (ref 80.0–100.0)
Platelets: 361 10*3/uL (ref 150–400)
RBC: 3.25 MIL/uL — ABNORMAL LOW (ref 3.87–5.11)
RDW: 15.7 % — ABNORMAL HIGH (ref 11.5–15.5)
WBC: 9 10*3/uL (ref 4.0–10.5)
nRBC: 0 % (ref 0.0–0.2)

## 2021-09-21 LAB — GLUCOSE, CAPILLARY
Glucose-Capillary: 124 mg/dL — ABNORMAL HIGH (ref 70–99)
Glucose-Capillary: 106 mg/dL — ABNORMAL HIGH (ref 70–99)
Glucose-Capillary: 123 mg/dL — ABNORMAL HIGH (ref 70–99)
Glucose-Capillary: 164 mg/dL — ABNORMAL HIGH (ref 70–99)

## 2021-09-21 LAB — CULTURE, BLOOD (ROUTINE X 2)
Culture: NO GROWTH
Culture: NO GROWTH
Special Requests: ADEQUATE
Special Requests: ADEQUATE

## 2021-09-21 LAB — PROCALCITONIN: Procalcitonin: 23.74 ng/mL

## 2021-09-21 MED ORDER — HEPARIN SODIUM (PORCINE) 1000 UNIT/ML DIALYSIS: 20.0000 [IU]/kg | INTRAMUSCULAR | Status: DC | PRN

## 2021-09-21 NOTE — Assessment & Plan Note (Addendum)
Unclear source.  Procal up but hard to interpret with renal failure.  Flu and COVID negative.  RVP negative.  CXR clear.  Blood cultures negative.  I presume this was a viral syndrome, but no data guide [place].  No fever for last 48 hours off antibiotics. - Hold Abx

## 2021-09-21 NOTE — Progress Notes (Signed)
[facility] Progress Note   Subjective:   Patient seen and examined at bedside.  Reports "knees gave out" this morning when walking to the bathroom.  No injuries with fall.  No other complaints.  Agrees to go to dialysis today.  Denies CP, SOB, abdominal pain, weakness, dizziness and fatigue.    Objective Vitals:   09/20/21 0755 09/20/21 2033 09/21/21 0401 09/21/21 0420  BP: (!) 129/44 (!) 190/66  (!) 137/59  Pulse: 72 73  71  Resp: 20 20  18   Temp: 98.2 F (36.8 C) 98.1 F (36.7 C)  98 F (36.7 C)  TempSrc:  Oral  Oral  SpO2: 97% 99%  100%  Weight:   57.2 kg   Height:       Physical Exam General:chronically ill appearing female in NAD Heart:RRR, no mrg Lungs:CTAB, nml WOB Abdomen:soft, NTND Extremities:no LE edema Dialysis Access: LU AVF +b/t   Filed Weights   09/19/21 0500 09/20/21 0500 09/21/21 0401  Weight: 56.8 kg 56.8 kg 57.2 kg   No intake or output data in the 24 hours ending 09/21/21 1249  Additional Objective Labs: Basic Metabolic Panel: Recent Labs  Lab 09/17/21 0212 09/18/21 1538 09/20/21 0044 09/21/21 0248  NA 134* 133* 132* 124*  K 5.2* 3.4* 3.9 3.9  CL 101 97* 94* 89*  CO2 13* 23 23 19*  GLUCOSE 104* 157* 147* 110*  BUN 87* 33* 68* 72*  CREATININE 15.61* 7.63* 10.09* 10.56*  CALCIUM 8.4* 8.6* 8.2* 8.8*  PHOS 11.5* 4.2  --   --    Liver Function Tests: Recent Labs  Lab 09/16/21 1242 09/17/21 0212 09/18/21 1538 09/20/21 1221 09/21/21 0248  AST 13*  --   --  14* 23  ALT 10  --   --  10 12  ALKPHOS 62  --   --  45 44  BILITOT 0.6  --   --  0.5 0.4  PROT 7.7  --   --  6.6 6.7  ALBUMIN 3.1*   < > 2.4* 2.5* 2.5*   < > = values in this interval not displayed.   CBC: Recent Labs  Lab 09/16/21 1242 09/17/21 0212 09/18/21 1538 09/20/21 0044 09/21/21 0248  WBC 11.6* 11.7* 9.7 8.6 9.0  NEUTROABS 9.9*  --   --   --   --   HGB 10.1* 10.4* 10.7* 9.4* 9.4*  HCT 31.9* 32.5* 31.9* 29.1* 28.7*  MCV 91.7 91.0 86.7 89.3 88.3   PLT 354 332 316 339 361   Blood Culture    Component Value Date/Time   SDES BLOOD RIGHT HAND 09/20/2021 1224   SPECREQUEST  09/20/2021 1224    BOTTLES DRAWN AEROBIC ONLY Blood Culture adequate volume   CULT  09/20/2021 1224    NO GROWTH < 24 HOURS Performed at [facility], [place] [address], [place], [place] [address]    REPTSTATUS PENDING 09/20/2021 1224    CBG: Recent Labs  Lab 09/20/21 1551 09/20/21 1758 09/20/21 2035 09/21/21 0756 09/21/21 1113  GLUCAP 129* 134* 114* 124* 106*    Studies/Results: DG Chest 2 View  Result Date: 09/20/2021 CLINICAL DATA:  Fever, diabetes, hypertension EXAM: CHEST - 2 VIEW COMPARISON:  09/16/2021 FINDINGS: Enlargement of cardiac silhouette with pulmonary vascular congestion. Atherosclerotic calcification aorta. Lungs clear. No pulmonary infiltrate, pleural effusion, or pneumothorax. Osseous demineralization. IMPRESSION: Enlargement of cardiac silhouette. No acute abnormalities. Aortic Atherosclerosis (ICD10-I70.0). Electronically Signed   By: Lavonia Dana M.D.   On: 09/20/2021 15:38  US Abdomen Limited RUQ (LIVER/GB)  Result Date: 09/21/2021 CLINICAL DATA:  Fever, nausea and vomiting EXAM: ULTRASOUND ABDOMEN LIMITED RIGHT UPPER QUADRANT COMPARISON:  None. FINDINGS: Gallbladder: No gallstones or wall thickening visualized. No sonographic Murphy sign noted by sonographer. Common bile duct: Diameter: 4.5 mm, normal Liver: No focal lesion identified. Within normal limits in parenchymal echogenicity. Portal vein is patent on color Doppler imaging with normal direction of blood flow towards the liver. Other: None. IMPRESSION: Unremarkable right upper quadrant ultrasound. Electronically Signed   By: Macy Mis M.D.   On: 09/21/2021 08:36    Medications:  sodium chloride      amLODipine  10 mg Oral Daily   atropine  1 drop Both Eyes BID   buPROPion  150 mg Oral Daily   calcium acetate  2,001 mg Oral TID WC   Chlorhexidine Gluconate Cloth   6 each Topical Q0600   darbepoetin (ARANESP) injection - DIALYSIS  60 mcg Intravenous Q Mon-HD   dorzolamide-timolol  1 drop Both Eyes BID   furosemide  40 mg Oral Daily   gabapentin  600 mg Oral BID   heparin  5,000 Units Subcutaneous Q8H   hydrALAZINE  150 mg Oral BID   insulin aspart  0-6 Units Subcutaneous TID WC   lamoTRIgine  25 mg Oral Daily   latanoprost  1 drop Both Eyes QHS   nicotine  14 mg Transdermal Daily   pravastatin  80 mg Oral QHS   sodium chloride flush  3 mL Intravenous Q12H   timolol  1 drop Left Eye QHS    Dialysis Orders: [facility]  MWF   Assessment/Plan: 1 hypoxia - repeat CXR remains clear.  Extra volume removed with HD - HD 11/24 and 11/25. Now on RA.  2. SIRS/Sepsis. No obvious source. ABX per primary have been stopped  3 ESRD: [facility] MWF. Plan for HD today per regular schedule.  4 Hypertension/Volume: BP close to goal. Amlodipine and hydralazine resumed. Post wt 55.5 kg 09/18/2021. On lasix.  Unsure of dry weight, does not appear volume overloaded. 5. Anemia of ESRD: Hgb 9.4. ESA ordered with HD today.  6. Metabolic Bone Disease: Calcium and phos in goal.  Continue calcium acetate.  7. Metabolic acidosis- Resolved with HD.  8. [place] for d/c from renal standpoint.  Reports she has decided to go to rehab to try to get stronger.  Reiterated importance of compliance with dialysis.  Per previous provider "I told her that she is ESRD-  needs to continue with dialysis-  there is nothing that will bring her kidneys back.  In order to be a transplant candidate would need to stop smoking and be more compliant. "  Jen Mow, PA-C [facility] 09/21/2021,12:49 PM  LOS: 5 days

## 2021-09-21 NOTE — Assessment & Plan Note (Signed)
-  Continue amlodipine, furosemide, hydralazine,

## 2021-09-21 NOTE — Progress Notes (Signed)
Physical Therapy Treatment Patient Details Name: Tammy Moses MRN: [id number] DOB: 1966-07-25 Today's Date: 09/21/2021   History of Present Illness 55 y/o presented to ED on 11/23 for 1 week history of SOB and cough with nausea/vomiting. Also has missed HD for unknown time, told nephrology 11/16. Found to have hypoxia. PMH: diabetes, ESRD on HD, diabetic neuropathy, diabetic retinopathy, HTN, tobacco dependence, blind in R eye, COPD/asthma    PT Comments    Patient not progressing with mobility today due to LEs buckling with standing on a few occasions. Need second person for chair follow to progress ambulation away from bed today. Session focused on functional strengthening, marching and there ex. Pt understands that she cannot return home in this state and motivated to get to rehab so she can work towards home. Instructed pt in there ex. Will follow and progress as tolerated.   Recommendations for follow up therapy are one component of a multi-disciplinary discharge planning process, led by the attending physician.  Recommendations may be updated based on patient status, additional functional criteria and insurance authorization.  Follow Up Recommendations  Skilled nursing-short term rehab (<3 hours/day)     Assistance Recommended at Discharge Frequent or constant Supervision/Assistance  Equipment Recommendations  Rolling walker (2 wheels)    Recommendations for Other Services       Precautions / Restrictions Precautions Precautions: Fall;Other (comment) Precaution Comments: blind, Rt foot drop, LEs buckling Restrictions Weight Bearing Restrictions: No     Mobility  Bed Mobility Overal bed mobility: Needs Assistance Bed Mobility: Supine to Sit;Sit to Supine     Supine to sit: Supervision;HOB elevated Sit to supine: Supervision;HOB elevated   General bed mobility comments: supervision for safety, use of rails with some guidance due to blindness.    Transfers Overall  transfer level: Needs assistance Equipment used: Rolling walker (2 wheels) Transfers: Sit to/from Stand Sit to Stand: Mod assist           General transfer comment: Initially mod A to stand with LEs buckling immediately and falling down onto bed x3, stood from EOB x10 with less assist needed each time. Pt more comfortable pulling up on RW to rise.    Ambulation/Gait             Pre-gait activities: Performed marching x10 x4 times in standing with fatigue noted and buckling towards end. Side stepped along side bed with Min A for support and RW. General Gait Details: Deferred as need second person fo chair follow due to LEs buckling without warning.   Stairs             Wheelchair Mobility    Modified Rankin (Stroke Patients Only)       Balance Overall balance assessment: Needs assistance Sitting-balance support: No upper extremity supported;Feet supported Sitting balance-Leahy Scale: Fair     Standing balance support: During functional activity;Reliant on assistive device for balance Standing balance-Leahy Scale: Poor Standing balance comment: reliant on UE support and external assist due LEs buckling, more assist needed with marching.                            Cognition Arousal/Alertness: Awake/alert Behavior During Therapy: WFL for tasks assessed/performed Overall Cognitive Status: No family/caregiver present to determine baseline cognitive functioning                                 General  Comments: Pt motivated to work with therapy and get stronger so she can go home. Showing some awareness of safety today as she states she cannot walk and needs rehab.        Exercises Other Exercises Other Exercises: Sit to stand from EOB x10 with marching x10 Other Exercises: SLR x10 BLEs Other Exercises: SLR into hip abduction x10 BLEs Other Exercises: Resisted hip abduction sitting EOB x10 Other Exercises: Assisted sit ups using bed  rails to help x10 with emphasis on slow descent    General Comments        Pertinent Vitals/Pain Pain Assessment: No/denies pain    Home Living                          Prior Function            PT Goals (current goals can now be found in the care plan section) Progress towards PT goals: Progressing toward goals    Frequency    Min 3X/week      PT Plan Current plan remains appropriate    Co-evaluation              AM-PAC PT "6 Clicks" Mobility   Outcome Measure  Help needed turning from your back to your side while in a flat bed without using bedrails?: A Little Help needed moving from lying on your back to sitting on the side of a flat bed without using bedrails?: A Little Help needed moving to and from a bed to a chair (including a wheelchair)?: A Little Help needed standing up from a chair using your arms (e.g., wheelchair or bedside chair)?: A Lot Help needed to walk in hospital room?: A Lot Help needed climbing 3-5 steps with a railing? : Total 6 Click Score: 14    End of Session Equipment Utilized During Treatment: Gait belt Activity Tolerance: Patient tolerated treatment well Patient left: in bed;with call bell/phone within reach;with bed alarm set Nurse Communication: Mobility status PT Visit Diagnosis: Unsteadiness on feet (R26.81);Muscle weakness (generalized) (M62.81);Repeated falls (R29.6);History of falling (Z91.81);Other abnormalities of gait and mobility (R26.89);Difficulty in walking, not elsewhere classified (R26.2)     Time: 1040-1110 PT Time Calculation (min) (ACUTE ONLY): 30 min  Charges:  $Therapeutic Exercise: 8-22 mins $Therapeutic Activity: 8-22 mins                     Marisa Severin, PT, DPT Acute Rehabilitation Services Pager [phone] Office [phone]      Marguarite Arbour A Sabra Heck 09/21/2021, 12:20 PM

## 2021-09-21 NOTE — Assessment & Plan Note (Signed)
No ischemia or infarction

## 2021-09-21 NOTE — Care Management Important Message (Signed)
Important Message  Patient Details  Name: Tammy Moses MRN: [id number] Date of Birth: [birth year]   Medicare Important Message Given:  Yes     Joetta Manners 09/21/2021, 2:29 PM

## 2021-09-21 NOTE — Progress Notes (Signed)
  Progress Note    CARLTON SWEANEY   GQQ:[id number]  DOB: 20-Aug-1966  DOA: 09/16/2021     5 Date of Service: 09/21/2021      Brief summary: Mrs. Tammy Moses is a 55 y.o. F with hx ESRD on HD MWF, DM, HTN, and asthma/COPD who presented with 1 week SOB as well as cough, nausea and vomiting.  In the ER, Temp 101F, BNP elevated, UA and CXR unremarkable.        Assessment and Plan * SIRS (systemic inflammatory response syndrome) (HCC) Unclear source.  Procal up but hard to interpret with renal failure.  Flu and COVID negative.  RVP negative.  CXR clear.  Blood cultures negative.  I presume this was a viral syndrome, but no data guide [place].  No fever for last 48 hours off antibiotics. - Hold Abx  ESRD (end stage renal disease) ([place]) - Consult Nephrology  Essential hypertension -Continue amlodipine, furosemide, hydralazine,   COPD (chronic obstructive pulmonary disease) (HCC) Respiratory failure ruled out. -Continue albuterol PRN  Diabetes mellitus (HCC) Glucose normal -Continue SS corrections -Continue gabapentin  Anemia due to chronic kidney disease    Elevated troponin No ischemia or infarction     Subjective:  Patient has leg cramps.  Otherwise she feels at her baseline.  No fever.  No headache, neck pain, change in mentation, change in vision.  No seizures.  No abdominal pain, vomiting.  No diarrhea.  No respiratory symptoms.  Objective Vitals:   09/21/21 1530 09/21/21 1600 09/21/21 1630 09/21/21 1700  BP: (!) 145/59 (!) 164/53 (!) 171/51 (!) 136/52  Pulse: 70 80 71 71  Resp: 13 (!) 24 15 15   Temp:      TempSrc:      SpO2:      Weight:      Height:       60.3 kg  Vital signs were reviewed and unremarkable.   Exam General appearance: Adult female, lying in bed, no obvious distress, appears chronically ill, slightly disheveled     HEENT: Opacification of both corneas, amblyopia noted, patient is blind.  No nasal deformity, discharge, or epistaxis.   Dentition in poor repair, no oral lesions, lips normal. Skin: No suspicious rashes or lesions. Cardiac: RRR, no murmurs, no lower extremity edema Respiratory: Normal respiratory rate and rhythm, lungs clear without rales or wheezes. Abdomen: Abdomen soft without tenderness palpation or guarding, no ascites or distention. MSK: Somewhat reduced muscle mass and fat diffusely, bilateral feet have no changes to the skin, no swelling, no deformity, no ulcers. Neuro: Awake and alert, blind, face symmetric, speech fluent, upper extremity strength normal without tremor or weakness.  Lower extremity strength bilaterally weak, no focality. Psych: attention Normal, affect normal, judgment insight appear normal    Labs / Other Information My review of labs, imaging, notes and other tests is significant for Sodium up to 124, hemoglobin stable at 9.4, procalcitonin still greater than 20, white blood cell count normal     Disposition Plan: Status is: Inpatient  Remains inpatient appropriate because: She is unable to stand or walk due to leg cramps.  We will work towards disposition to rehab  Called family number listed in chart, no answer        Time spent: 25 minutes [facility] 09/21/2021, 5:36 PM

## 2021-09-21 NOTE — Assessment & Plan Note (Signed)
Respiratory failure ruled out. -Continue albuterol PRN

## 2021-09-21 NOTE — TOC Progression Note (Addendum)
Transition of Care [facility]) - Progression Note    Patient Details  Name: Tammy Moses MRN: [id number] Date of Birth: 1965-12-01  Transition of Care [facility]) CM/SW Contact  Joanne Chars, LCSW Phone Number: 09/21/2021, 10:21 AM  Clinical Narrative:   level 2 PASSR docs uploaded in [place] [facility].  1600: PASSR received: [id number]    Expected Discharge Plan: [place] Barriers to Discharge: Continued Medical Work up, SNF Pending bed offer  Expected Discharge Plan and Services Expected Discharge Plan: [place] In-house Referral: Clinical Social Work   Post Acute Care Choice: [place] Living arrangements for the past 2 months: Apartment                                       Social Determinants of Health (SDOH) Interventions    Readmission Risk Interventions No flowsheet data found.

## 2021-09-21 NOTE — Assessment & Plan Note (Signed)
-   Consult Nephrology 

## 2021-09-21 NOTE — Assessment & Plan Note (Signed)
Glucose normal -Continue SS corrections -Continue gabapentin

## 2021-09-22 ENCOUNTER — Inpatient Hospital Stay (HOSPITAL_COMMUNITY): Payer: Medicare Other

## 2021-09-22 DIAGNOSIS — A419 Sepsis, unspecified organism: Secondary | ICD-10-CM | POA: Diagnosis not present

## 2021-09-22 DIAGNOSIS — M79671 Pain in right foot: Secondary | ICD-10-CM | POA: Diagnosis not present

## 2021-09-22 LAB — GLUCOSE, CAPILLARY
Glucose-Capillary: 106 mg/dL — ABNORMAL HIGH (ref 70–99)
Glucose-Capillary: 99 mg/dL (ref 70–99)
Glucose-Capillary: 159 mg/dL — ABNORMAL HIGH (ref 70–99)
Glucose-Capillary: 136 mg/dL — ABNORMAL HIGH (ref 70–99)

## 2021-09-22 LAB — PROCALCITONIN: Procalcitonin: 17.06 ng/mL

## 2021-09-22 NOTE — Progress Notes (Signed)
Mobility Specialist Progress Note   09/22/21 1400  Mobility  Activity Ambulated in room  Level of Assistance Moderate assist, patient does 50-74%  Assistive Device Front wheel walker  Distance Ambulated (ft) 25 ft  Mobility Ambulated with assistance in room  Mobility Response Tolerated fair  Mobility performed by Mobility specialist  $Mobility charge 1 Mobility   Received pt EOB requesting a mobility session. Before starting, pt c/o having slight pain in the RLE that was sensitive to the touch but was still motivated to get up. Pt's ambulation is restricted d/t lack in spatial awareness because of vision impairment and bouts of buckling d/t LE weakness. Returned to bed w/ call bell by side and all needs met and no new complaints.    Holland Falling Mobility Specialist Phone Number [phone]

## 2021-09-22 NOTE — Assessment & Plan Note (Signed)
No ischemia or infarction

## 2021-09-22 NOTE — Plan of Care (Signed)
Patient is calm and cooperative with safety precautions. Frequent checks on patient due to blindness and concerns over any challenges this may make.

## 2021-09-22 NOTE — Progress Notes (Signed)
  Mobility Specialist Criteria Algorithm Info.  09/22/21 1007  Pain Assessment  Faces Pain Scale 10  Pain Location  (RLE)  Pain Descriptors / Indicators Jabbing;Sharp  Pain Intervention(s) Patient requesting pain meds-RN notified  Mobility  Activity Contraindicated/medical hold;Refused mobility;Turned to right side;Turned to left side;Turned to back (supine) (Pt in 10/10 pain. Hallux of RLE is sensitive to touch, warm and red. Declined due to significant pain.)  Mobility Response RN notified  Mobility performed by Mobility specialist       09/22/2021 10:07 AM

## 2021-09-22 NOTE — Consult Note (Addendum)
   [facility] Inpatient Consult   09/22/2021  YLONDA STORR 07/18/1966 [id number]  [place] [facility] [ACO] Patient:  Primary Care Provider:  [name], FNP (Inactive) noted   Patient referred from inpatient [facility]  for post hospitalization follow up for potential [place] [facility] service needs for post hospital transition.  Review of patient's medical record reveals patient was currently of the unit and returning now.  Plan:  Continue to follow progress and disposition to assess for post hospital care management needs.    For questions contact:   Natividad Brood, RN BSN [facility] Liaison  [phone] business mobile phone Toll free office [phone]  Fax number: [phone] [email or web address]     .

## 2021-09-22 NOTE — Assessment & Plan Note (Signed)
Respiratory failure ruled out. -Continue albuterol PRN

## 2021-09-22 NOTE — Progress Notes (Addendum)
Occupational Therapy Treatment Patient Details Name: Tammy Moses MRN: [id number] DOB: Jul 30, 1966 Today's Date: 09/22/2021   History of present illness 55 y/o presented to ED on 11/23 for 1 week history of SOB and cough with nausea/vomiting. Also has missed HD for unknown time, told nephrology 11/16. Found to have hypoxia. PMH: diabetes, ESRD on HD, diabetic neuropathy, diabetic retinopathy, HTN, tobacco dependence, blind in R eye, COPD/asthma   OT comments  Pt is slowly progressing towards OT goals. During session, pt donned/doffed socks with Min A and completed toilet transfer with Min A. Continues to be limited by pain, vision, and balance, also demonstrating decreased awareness of safety/deficits. Continue to recommend SNF. Will continue to follow acutely.   Recommendations for follow up therapy are one component of a multi-disciplinary discharge planning process, led by the attending physician.  Recommendations may be updated based on patient status, additional functional criteria and insurance authorization.    Follow Up Recommendations  Skilled nursing-short term rehab (<3 hours/day)    Assistance Recommended at Discharge Frequent or constant Supervision/Assistance  Equipment Recommendations  Other (comment) (Defer)    Recommendations for Other Services      Precautions / Restrictions Precautions Precautions: Fall;Other (comment) Precaution Comments: blind, Rt foot drop, LEs buckling Restrictions Weight Bearing Restrictions: No       Mobility Bed Mobility Overal bed mobility: Needs Assistance Bed Mobility: Supine to Sit;Sit to Supine     Supine to sit: Supervision;HOB elevated Sit to supine: Supervision;HOB elevated        Transfers Overall transfer level: Needs assistance Equipment used: Rolling walker (2 wheels) Transfers: Sit to/from Stand Sit to Stand: Min assist                 Balance Overall balance assessment: Needs assistance Sitting-balance  support: No upper extremity supported;Feet supported Sitting balance-Leahy Scale: Fair     Standing balance support: During functional activity;Reliant on assistive device for balance Standing balance-Leahy Scale: Poor                             ADL either performed or assessed with clinical judgement   ADL Overall ADL's : Needs assistance/impaired                     Lower Body Dressing: Minimal assistance;Sitting/lateral leans Lower Body Dressing Details (indicate cue type and reason): Donned/doffed socks at EOB with assist for balance Toilet Transfer: Minimal assistance;Cueing for safety;Stand-pivot;BSC/3in1;Rolling walker (2 wheels)                  Extremity/Trunk Assessment              Vision       Perception     Praxis      Cognition Arousal/Alertness: Awake/alert Behavior During Therapy: WFL for tasks assessed/performed Overall Cognitive Status: No family/caregiver present to determine baseline cognitive functioning                                 General Comments: A&Ox4. Slow processing and decreased awareness of deficits.          Exercises     Shoulder Instructions       General Comments      Pertinent Vitals/ Pain       Pain Assessment: 0-10 Pain Score: 9  Pain Descriptors / Indicators: Jabbing;Sharp Pain Intervention(s): Limited activity within  patient's tolerance;Monitored during session  Home Living                                          Prior Functioning/Environment              Frequency  Min 2X/week        Progress Toward Goals  OT Goals(current goals can now be found in the care plan section)  Progress towards OT goals: Progressing toward goals  Acute Rehab OT Goals Patient Stated Goal: return home OT Goal Formulation: With patient Time For Goal Achievement: 10/04/21 Potential to Achieve Goals: Good ADL Goals Pt Will Perform Lower Body Bathing: with min  guard assist;sitting/lateral leans;sit to/from stand Pt Will Perform Lower Body Dressing: with min guard assist;sitting/lateral leans;sit to/from stand Pt Will Transfer to Toilet: with min guard assist;ambulating Pt Will Perform Toileting - Clothing Manipulation and hygiene: with min guard assist;sitting/lateral leans;sit to/from stand Additional ADL Goal #1: Pt will follow simple commands 100% of the time with no cuing.  Plan Discharge plan remains appropriate;Frequency remains appropriate    Co-evaluation                 AM-PAC OT "6 Clicks" Daily Activity     Outcome Measure   Help from another person eating meals?: A Little Help from another person taking care of personal grooming?: A Little Help from another person toileting, which includes using toliet, bedpan, or urinal?: A Lot Help from another person bathing (including washing, rinsing, drying)?: A Lot Help from another person to put on and taking off regular upper body clothing?: A Little Help from another person to put on and taking off regular lower body clothing?: A Lot 6 Click Score: 15    End of Session Equipment Utilized During Treatment: Gait belt;Rolling walker (2 wheels)  OT Visit Diagnosis: Unsteadiness on feet (R26.81);Other abnormalities of gait and mobility (R26.89);Muscle weakness (generalized) (M62.81)   Activity Tolerance Patient limited by pain   Patient Left in bed;with call bell/phone within reach   Nurse Communication Mobility status        Time: 3220-2542 OT Time Calculation (min): 28 min  Charges: OT General Charges $OT Visit: 1 Visit OT Treatments $Self Care/Home Management : 8-22 mins $Therapeutic Activity: 8-22 mins  Denise Washburn C, OT/L  [facility] [place] 09/22/2021, 5:40 PM

## 2021-09-22 NOTE — Assessment & Plan Note (Signed)
Unclear source.   Procal up but hard to interpret with renal failure.  Flu and COVID negative.  RVP negative.  CXR clear.  Blood cultures negative.  I presume this was a viral syndrome, but no data guide [place].  No fever for last 72 hours off antibiotics.  This appears to be resolved. - Hold Abx

## 2021-09-22 NOTE — Assessment & Plan Note (Signed)
Glucose normal -Continue SS corrections -Continue gabapentin

## 2021-09-22 NOTE — Progress Notes (Signed)
Progress Note    Tammy Moses   FGH:[id number]  DOB: 04/06/1966  DOA: 09/16/2021     6 Date of Service: 09/22/2021     Brief summary: Tammy Moses is a 55 y.o. F with hx ESRD on HD MWF, DM, HTN, and asthma/COPD who presented with 1 week SOB as well as cough, nausea and vomiting.  In the ER, Temp 101F, BNP elevated, UA and CXR unremarkable.  11/23: admitted and started on empiric antibiotics 11/26: cultures all negative, no obvious source, antibiotics stopped  11/27-11/29: asymptomatic, at baseline, no further fever or leukocytosis, repeat cultures negative, however, patient with new foot pain without swelling, ?neuropathy, muscle strain? Preventing patient from walking        Assessment and Plan * SIRS (systemic inflammatory response syndrome) (HCC) Unclear source.   Procal up but hard to interpret with renal failure.  Flu and COVID negative.  RVP negative.  CXR clear.  Blood cultures negative.  I presume this was a viral syndrome, but no data guide [place].  No fever for last 72 hours off antibiotics.  This appears to be resolved. - Hold Abx  Foot pain, right Patient reports "swelling" and pain of the right foot.  There is no tenderness of the MTP joint, it is not gout.  There is no swelling or deformity.  I do not appreciate any redness, and have been observing her foot daily since Sunday without change.  She reports pain with weight bearing and "tremor" which I have also not observed.  She refuses to walk and therapy recommend SNF.  - X-ray right foot - TOC consult for SNF referral  The x-ray of the right foot is normal I would recommend following up with her PCP for neuropathy. - Continue gabapentin    ESRD (end stage renal disease) (HCC) - Consult Nephrology  Essential hypertension BP controlled -Continue amlodipine, furosemide, hydralazine,   COPD (chronic obstructive pulmonary disease) (HCC) Respiratory failure ruled out. -Continue albuterol PRN  Diabetes  mellitus (HCC) Glucose normal -Continue SS corrections -Continue gabapentin  Anemia due to chronic kidney disease    Elevated troponin No ischemia or infarction     Subjective:  Still has foot pain.  Feels "swollen".  No fever.  Objective Vitals:   09/21/21 1718 09/21/21 1725 09/21/21 1801 09/22/21 0805  BP: (!) 142/44 (!) 157/61 (!) 147/55 (!) 143/58  Pulse: 71 70 72 75  Resp: (!) 22 13 16 18  Temp:   98.2 F (36.8 C) 98.2 F (36.8 C)  TempSrc:   Oral   SpO2:   98% 100%  Weight:      Height:       60 .3 kg  Vital signs were reviewed and unremarkable.   Exam General appearance: Female, lying in bed, in no acute distress.  Interactive.     HEENT: Bilateral eyes cloudy, amblyopia, no nasal deformity, discharge, or epistaxis.  Oropharynx moist, dentition poor. Skin: No redness or swelling of the right foot. Cardiac: RRR, no murmurs, no lower extremity edema Respiratory: Normal respiratory rate and rhythm, lungs clear without rales or wheezes Abdomen: Abdomen soft no tenderness palpation or guarding, no ascites or distention MSK: Reduced subcutaneous muscle mass and fat diffusely Neuro: Awake and alert, extraocular movements irregular due to chronic blindness, speech fluent, face symmetric, upper extremity strength normal, bilateral lower extremity strength weak Psych: Attention normal, affect normal, judgment insight appear normal    Labs / Other Information There are no new results to review at this time.  Disposition Plan: Status is: Inpatient  Remains inpatient appropriate because: She is unable to walk, will require rehabilitation to return to her prior level of function.  Called to primary contact in chart, no answer.        Time spent: 25 minutes [facility] 09/22/2021, 5:42 PM

## 2021-09-22 NOTE — Assessment & Plan Note (Signed)
-   Consult Nephrology 

## 2021-09-22 NOTE — Plan of Care (Signed)
Problem: Education: Goal: Knowledge of General Education information will improve Description: Including pain rating scale, medication(s)/side effects and non-pharmacologic comfort measures 09/22/2021 1719 by Reeves Forth, RN Outcome: Progressing 09/22/2021 1436 by Reeves Forth, RN Outcome: Progressing   Problem: Health Behavior/Discharge Planning: Goal: Ability to manage health-related needs will improve 09/22/2021 1719 by Reeves Forth, RN Outcome: Progressing 09/22/2021 1436 by Reeves Forth, RN Outcome: Progressing   Problem: Clinical Measurements: Goal: Ability to maintain clinical measurements within normal limits will improve 09/22/2021 1719 by Reeves Forth, RN Outcome: Progressing 09/22/2021 1436 by Reeves Forth, RN Outcome: Progressing Goal: Will remain free from infection 09/22/2021 1719 by Reeves Forth, RN Outcome: Progressing 09/22/2021 1436 by Reeves Forth, RN Outcome: Progressing Goal: Diagnostic test results will improve 09/22/2021 1719 by Reeves Forth, RN Outcome: Progressing 09/22/2021 1436 by Reeves Forth, RN Outcome: Progressing Goal: Respiratory complications will improve 09/22/2021 1719 by Reeves Forth, RN Outcome: Progressing 09/22/2021 1436 by Reeves Forth, RN Outcome: Progressing Goal: Cardiovascular complication will be avoided 09/22/2021 1719 by Reeves Forth, RN Outcome: Progressing 09/22/2021 1436 by Reeves Forth, RN Outcome: Progressing   Problem: Clinical Measurements: Goal: Ability to maintain clinical measurements within normal limits will improve 09/22/2021 1719 by Reeves Forth, RN Outcome: Progressing 09/22/2021 1436 by Reeves Forth, RN Outcome: Progressing Goal: Will remain free from infection 09/22/2021 1719 by Reeves Forth, RN Outcome: Progressing 09/22/2021 1436 by Reeves Forth, RN Outcome: Progressing Goal: Diagnostic test results will improve 09/22/2021 1719 by Reeves Forth, RN Outcome: Progressing 09/22/2021 1436 by Reeves Forth, RN Outcome: Progressing Goal: Respiratory complications will improve 09/22/2021 1719 by Reeves Forth, RN Outcome: Progressing 09/22/2021 1436 by Reeves Forth, RN Outcome: Progressing Goal: Cardiovascular complication will be avoided 09/22/2021 1719 by Reeves Forth, RN Outcome: Progressing 09/22/2021 1436 by Reeves Forth, RN Outcome: Progressing   Problem: Activity: Goal: Risk for activity intolerance will decrease 09/22/2021 1719 by Reeves Forth, RN Outcome: Progressing 09/22/2021 1436 by Reeves Forth, RN Outcome: Progressing   Problem: Nutrition: Goal: Adequate nutrition will be maintained 09/22/2021 1719 by Reeves Forth, RN Outcome: Progressing 09/22/2021 1436 by Reeves Forth, RN Outcome: Progressing   Problem: Coping: Goal: Level of anxiety will decrease 09/22/2021 1719 by Reeves Forth, RN Outcome: Progressing 09/22/2021 1436 by Reeves Forth, RN Outcome: Progressing   Problem: Elimination: Goal: Will not experience complications related to bowel motility 09/22/2021 1719 by Reeves Forth, RN Outcome: Progressing 09/22/2021 1436 by Reeves Forth, RN Outcome: Progressing Goal: Will not experience complications related to urinary retention 09/22/2021 1719 by Reeves Forth, RN Outcome: Progressing 09/22/2021 1436 by Reeves Forth, RN Outcome: Progressing   Problem: Pain Managment: Goal: General experience of comfort will improve 09/22/2021 1719 by Reeves Forth, RN Outcome: Progressing 09/22/2021 1436 by Reeves Forth, RN Outcome: Progressing   Problem: Safety: Goal: Ability to remain free from injury will improve 09/22/2021 1719 by Reeves Forth, RN Outcome: Progressing 09/22/2021 1436 by Reeves Forth, RN Outcome: Progressing   Problem: Education: Goal: Ability to demonstrate management of disease process will improve 09/22/2021 1719 by  Reeves Forth, RN Outcome: Progressing 09/22/2021 1436 by Reeves Forth, RN Outcome: Progressing Goal: Ability to verbalize understanding of medication therapies will improve 09/22/2021 1719 by Reeves Forth, RN Outcome: Progressing 09/22/2021 1436 by Reeves Forth, RN Outcome: Progressing Goal: Individualized Educational Video(s) 09/22/2021  1719 by Reeves Forth, RN Outcome: Progressing 09/22/2021 1436 by Reeves Forth, RN Outcome: Progressing   Problem: Activity: Goal: Capacity to carry out activities will improve 09/22/2021 1719 by Reeves Forth, RN Outcome: Progressing 09/22/2021 1436 by Reeves Forth, RN Outcome: Progressing   Problem: Cardiac: Goal: Ability to achieve and maintain adequate cardiopulmonary perfusion will improve 09/22/2021 1719 by Reeves Forth, RN Outcome: Progressing 09/22/2021 1436 by Reeves Forth, RN Outcome: Progressing

## 2021-09-22 NOTE — Progress Notes (Signed)
Tammy [facility] Progress Note   Subjective:   Patient seen and examined at bedside in room.  Reports pain in R foot for a few weeks but a little worse today.  Tolerated dialysis well yesterday.  No other specific complaints. Denies CP, SOB, abdominal pain and n/v/d.    Objective Vitals:   09/21/21 1718 09/21/21 1725 09/21/21 1801 09/22/21 0805  BP: (!) 142/44 (!) 157/61 (!) 147/55 (!) 143/58  Pulse: 71 70 72 75  Resp: (!) 22 13 16 18   Temp:   98.2 F (36.8 C) 98.2 F (36.8 C)  TempSrc:   Oral   SpO2:   98% 100%  Weight:      Height:       Physical Exam General:chronically ill appearing, blind female in NAD Heart:RRR, no mrg Lungs:CTAB, nml WOB on RA Abdomen:soft, NTND Extremities:no LE edema, no bruising or ulcerations b/l  Dialysis Access: LU AVF +b/t   Filed Weights   09/20/21 0500 09/21/21 0401 09/21/21 1338  Weight: 56.8 kg 57.2 kg 60.3 kg    Intake/Output Summary (Last 24 hours) at 09/22/2021 1126 Last data filed at 09/21/2021 1718 Gross per 24 hour  Intake 240 ml  Output 3000 ml  Net -2760 ml    Additional Objective Labs: Basic Metabolic Panel: Recent Labs  Lab 09/17/21 0212 09/18/21 1538 09/20/21 0044 09/21/21 0248  NA 134* 133* 132* 124*  K 5.2* 3.4* 3.9 3.9  CL 101 97* 94* 89*  CO2 13* 23 23 19*  GLUCOSE 104* 157* 147* 110*  BUN 87* 33* 68* 72*  CREATININE 15.61* 7.63* 10.09* 10.56*  CALCIUM 8.4* 8.6* 8.2* 8.8*  PHOS 11.5* 4.2  --   --    Liver Function Tests: Recent Labs  Lab 09/16/21 1242 09/17/21 0212 09/18/21 1538 09/20/21 1221 09/21/21 0248  AST 13*  --   --  14* 23  ALT 10  --   --  10 12  ALKPHOS 62  --   --  45 44  BILITOT 0.6  --   --  0.5 0.4  PROT 7.7  --   --  6.6 6.7  ALBUMIN 3.1*   < > 2.4* 2.5* 2.5*   < > = values in this interval not displayed.   CBC: Recent Labs  Lab 09/16/21 1242 09/17/21 0212 09/18/21 1538 09/20/21 0044 09/21/21 0248  WBC 11.6* 11.7* 9.7 8.6 9.0  NEUTROABS 9.9*  --   --   --    --   HGB 10.1* 10.4* 10.7* 9.4* 9.4*  HCT 31.9* 32.5* 31.9* 29.1* 28.7*  MCV 91.7 91.0 86.7 89.3 88.3  PLT 354 332 316 339 361   Blood Culture    Component Value Date/Time   SDES BLOOD RIGHT HAND 09/20/2021 1224   SPECREQUEST  09/20/2021 1224    BOTTLES DRAWN AEROBIC ONLY Blood Culture adequate volume   CULT  09/20/2021 1224    NO GROWTH 2 DAYS Performed at [facility], [place] [address] [place], [place] [address]    REPTSTATUS PENDING 09/20/2021 1224    Recent Labs  Lab 09/21/21 0756 09/21/21 1113 09/21/21 1800 09/21/21 2128 09/22/21 0804  GLUCAP 124* 106* 123* 164* 106*   Iron Studies: No results for input(s): IRON, TIBC, TRANSFERRIN, FERRITIN in the last 72 hours. Lab Results  Component Value Date   INR 1.2 09/16/2021   INR 0.88 01/30/2014   Studies/Results: DG Chest 2 View  Result Date: 09/20/2021 CLINICAL DATA:  Fever, diabetes, hypertension EXAM: CHEST - 2  VIEW COMPARISON:  09/16/2021 FINDINGS: Enlargement of cardiac silhouette with pulmonary vascular congestion. Atherosclerotic calcification aorta. Lungs clear. No pulmonary infiltrate, pleural effusion, or pneumothorax. Osseous demineralization. IMPRESSION: Enlargement of cardiac silhouette. No acute abnormalities. Aortic Atherosclerosis (ICD10-I70.0). Electronically Signed   By: Lavonia Dana M.D.   On: 09/20/2021 15:38   US Abdomen Limited RUQ (LIVER/GB)  Result Date: 09/21/2021 CLINICAL DATA:  Fever, nausea and vomiting EXAM: ULTRASOUND ABDOMEN LIMITED RIGHT UPPER QUADRANT COMPARISON:  None. FINDINGS: Gallbladder: No gallstones or wall thickening visualized. No sonographic Murphy sign noted by sonographer. Common bile duct: Diameter: 4.5 mm, normal Liver: No focal lesion identified. Within normal limits in parenchymal echogenicity. Portal vein is patent on color Doppler imaging with normal direction of blood flow towards the liver. Other: None. IMPRESSION: Unremarkable right upper quadrant ultrasound.  Electronically Signed   By: Macy Mis M.D.   On: 09/21/2021 08:36    Medications:  sodium chloride      amLODipine  10 mg Oral Daily   atropine  1 drop Both Eyes BID   buPROPion  150 mg Oral Daily   calcium acetate  2,001 mg Oral TID WC   Chlorhexidine Gluconate Cloth  6 each Topical Q0600   darbepoetin (ARANESP) injection - DIALYSIS  60 mcg Intravenous Q Mon-HD   dorzolamide-timolol  1 drop Both Eyes BID   furosemide  40 mg Oral Daily   gabapentin  600 mg Oral BID   heparin  5,000 Units Subcutaneous Q8H   hydrALAZINE  150 mg Oral BID   insulin aspart  0-6 Units Subcutaneous TID WC   lamoTRIgine  25 mg Oral Daily   latanoprost  1 drop Both Eyes QHS   nicotine  14 mg Transdermal Daily   pravastatin  80 mg Oral QHS   sodium chloride flush  3 mL Intravenous Q12H   timolol  1 drop Left Eye QHS    Dialysis Orders: [facility]  MWF   Assessment/Plan: 1 hypoxia - repeat CXR remains clear.  Extra volume removed with HD - HD 11/24 and 11/25. Now on RA.  2. SIRS/Sepsis. No obvious source. ABX per primary have been stopped  3 ESRD: [facility] MWF. HD yesterday per regular schedule.  If d/c today resume outpatient schedule.  4 Hypertension/Volume: BP close to goal. Amlodipine and hydralazine resumed. Post wt 55.5 kg 09/18/2021. On lasix.  Unsure of dry weight, does not appear volume overloaded. 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 ESA ordered with HD today.  6. Metabolic Bone Disease: Calcium and phos in goal.  Continue calcium acetate.  7. Metabolic acidosis- Resolved with HD.  8. [place] for d/c from renal standpoint.  Reports she has decided to go to rehab to try to get stronger.  Reiterated importance of compliance with dialysis.  Per previous provider "I told her that she is ESRD-  needs to continue with dialysis-  there is nothing that will bring her kidneys back.  In order to be a transplant candidate would need to stop smoking and be more compliant."  Jen Mow,  PA-C [facility] 09/22/2021,11:26 AM  LOS: 6 days

## 2021-09-22 NOTE — Progress Notes (Addendum)
Pt receives out-pt HD at [facility] on MWF. Pt arrives at 11:10 for 11:25 chair time. CSW and RN CM made aware of this info. Pt to d/c to home today per [facility] staff. [facility] has access to [facility] epic to obtain pt's d/c summary and aware pt to resume care tomorrow.   Melven Sartorius Renal Navigator [phone]   Addendum at 4:23 pm: D/C has been cancelled. Pt is in need of snf per [facility] staff. Contacted [facility] to make them aware pt will not be at clinic tomorrow as thought.

## 2021-09-22 NOTE — Assessment & Plan Note (Signed)
BP controlled -Continue amlodipine, furosemide, hydralazine,

## 2021-09-22 NOTE — Assessment & Plan Note (Signed)
Patient reports "swelling" and pain of the right foot.  There is no tenderness of the MTP joint, it is not gout.  There is no swelling or deformity.  I do not appreciate any redness, and have been observing her foot daily since Sunday without change.  She reports pain with weight bearing and "tremor" which I have also not observed.  She refuses to walk and therapy recommend SNF.  - X-ray right foot - TOC consult for SNF referral

## 2021-09-22 NOTE — TOC Transition Note (Incomplete)
Transition of Care [facility]) - CM/SW Discharge Note   Patient Details  Name: Tammy Moses MRN: [id number] Date of Birth: [birth year]  [facility]) CM/SW Contact:  Sharin Mons, RN Phone Number: 09/22/2021, 1:14 PM   Clinical Narrative:    Patient will DC to: home Anticipated DC date: Family notified: Transport by:   Per MD patient ready for DC today.  [facility] refuses SNF placement.RN, patient, patient's family, and [facility]  ( receives 2hrs, 45 min PCS qd) notified of DC. Pt states aware of HD session time and will arrange transportation for session. Agreeable to home health services. Pt with preference. Referral made with [facility], ACCEPTANCE PENDING. States will need transportation to home.  09/22/2021 1327 [place] accepted pt for home health services.  . DC packet on chart. Ambulance transport requested for patient.   RNCM will sign off for now as intervention is no longer needed. Please consult [place] again if new needs arise.    Final next level of care: [place] Barriers to Discharge: Continued Medical Work up, SNF Pending bed offer   Patient Goals and CMS Choice Patient states their goals for this hospitalization and ongoing recovery are:: "go home" CMS Medicare.gov Compare Post Acute Care list provided to:: Patient Choice offered to / list presented to : Patient  Discharge Placement                       Discharge Plan and Services In-house Referral: Clinical Social Work   Post Acute Care Choice: [place]          DME Arranged: Youth worker wheelchair with seat cushion DME Agency: [employer] Date DME Agency Contacted: 09/22/21 Time DME Agency Contacted: 0093 Representative spoke with at DME Agency: [place]: OT, PT, Social Work [employer] Agency: [place] ([place]) Date [place]: 09/22/21 Time [place]: [place] Representative spoke with at [place]: [place] ([place]) Interventions     Readmission Risk Interventions No flowsheet data found.

## 2021-09-23 DIAGNOSIS — N185 Chronic kidney disease, stage 5: Secondary | ICD-10-CM

## 2021-09-23 DIAGNOSIS — R6510 Systemic inflammatory response syndrome (SIRS) of non-infectious origin without acute organ dysfunction: Secondary | ICD-10-CM | POA: Diagnosis not present

## 2021-09-23 DIAGNOSIS — N186 End stage renal disease: Secondary | ICD-10-CM | POA: Diagnosis not present

## 2021-09-23 DIAGNOSIS — I10 Essential (primary) hypertension: Secondary | ICD-10-CM | POA: Diagnosis not present

## 2021-09-23 LAB — CBC
HCT: 31.3 % — ABNORMAL LOW (ref 36.0–46.0)
Hemoglobin: 10.1 g/dL — ABNORMAL LOW (ref 12.0–15.0)
MCH: 29.2 pg (ref 26.0–34.0)
MCHC: 32.3 g/dL (ref 30.0–36.0)
MCV: 90.5 fL (ref 80.0–100.0)
Platelets: 379 10*3/uL (ref 150–400)
RBC: 3.46 MIL/uL — ABNORMAL LOW (ref 3.87–5.11)
RDW: 15.9 % — ABNORMAL HIGH (ref 11.5–15.5)
WBC: 11.7 10*3/uL — ABNORMAL HIGH (ref 4.0–10.5)
nRBC: 0 % (ref 0.0–0.2)

## 2021-09-23 LAB — BASIC METABOLIC PANEL
Anion gap: 11 (ref 5–15)
BUN: 47 mg/dL — ABNORMAL HIGH (ref 6–20)
CO2: 22 mmol/L (ref 22–32)
Calcium: 8.9 mg/dL (ref 8.9–10.3)
Chloride: 94 mmol/L — ABNORMAL LOW (ref 98–111)
Creatinine, Ser: 9.24 mg/dL — ABNORMAL HIGH (ref 0.44–1.00)
GFR, Estimated: 5 mL/min — ABNORMAL LOW (ref >60)
Glucose, Bld: 111 mg/dL — ABNORMAL HIGH (ref 70–99)
Potassium: 4.1 mmol/L (ref 3.5–5.1)
Sodium: 127 mmol/L — ABNORMAL LOW (ref 135–145)

## 2021-09-23 LAB — GLUCOSE, CAPILLARY
Glucose-Capillary: 130 mg/dL — ABNORMAL HIGH (ref 70–99)
Glucose-Capillary: 106 mg/dL — ABNORMAL HIGH (ref 70–99)
Glucose-Capillary: 259 mg/dL — ABNORMAL HIGH (ref 70–99)
Glucose-Capillary: 250 mg/dL — ABNORMAL HIGH (ref 70–99)

## 2021-09-23 MED ORDER — HEPARIN SODIUM (PORCINE) 1000 UNIT/ML DIALYSIS
1000.0000 [IU] | INTRAMUSCULAR | Status: DC | PRN
  Filled 2021-09-23: qty 1

## 2021-09-23 MED ORDER — PENTAFLUOROPROP-TETRAFLUOROETH EX AERO: 1.0000 "application " | INHALATION_SPRAY | CUTANEOUS | Status: DC | PRN

## 2021-09-23 MED ORDER — HEPARIN SODIUM (PORCINE) 1000 UNIT/ML DIALYSIS
20.0000 [IU]/kg | INTRAMUSCULAR | Status: DC | PRN
  Filled 2021-09-23: qty 2

## 2021-09-23 MED ORDER — SODIUM CHLORIDE 0.9 % IV SOLN: 100.0000 mL | INTRAVENOUS | Status: DC | PRN

## 2021-09-23 MED ORDER — CHLORHEXIDINE GLUCONATE CLOTH 2 % EX PADS
6.0000 | MEDICATED_PAD | Freq: Every day | CUTANEOUS | Status: DC
  Administered 2021-09-23 – 2021-09-24 (×2): 6 via TOPICAL

## 2021-09-23 MED ORDER — LIDOCAINE-PRILOCAINE 2.5-2.5 % EX CREA
1.0000 "application " | TOPICAL_CREAM | CUTANEOUS | Status: DC | PRN
  Filled 2021-09-23: qty 5

## 2021-09-23 MED ORDER — LIDOCAINE HCL (PF) 1 % IJ SOLN
5.0000 mL | INTRAMUSCULAR | Status: DC | PRN
  Filled 2021-09-23: qty 5

## 2021-09-23 MED ORDER — PREDNISONE 20 MG PO TABS
40.0000 mg | ORAL_TABLET | Freq: Every day | ORAL | Status: DC
  Administered 2021-09-23 – 2021-09-24 (×2): 40 mg via ORAL
  Filled 2021-09-23 (×2): qty 2

## 2021-09-23 MED ORDER — ALTEPLASE 2 MG IJ SOLR
2.0000 mg | Freq: Once | INTRAMUSCULAR | Status: DC | PRN
  Filled 2021-09-23: qty 2

## 2021-09-23 NOTE — Progress Notes (Signed)
Continues with dialysis

## 2021-09-23 NOTE — Progress Notes (Signed)
Report received from Dialysis - High BP throughout treatment. At time of report 152/83 HR 80. Removed 2.5L. Tolerated well and states is hungry now.

## 2021-09-23 NOTE — Progress Notes (Signed)
Physical Therapy Treatment Patient Details Name: Tammy Moses MRN: [id number] DOB: Apr 16, 1966 Today's Date: 09/23/2021   History of Present Illness 55 y/o presented to ED on 11/23 for 1 week history of SOB and cough with nausea/vomiting. Also has missed HD for unknown time, told nephrology 11/16. Found to have hypoxia. PMH: diabetes, ESRD on HD, diabetic neuropathy, diabetic retinopathy, HTN, tobacco dependence, blind in R eye, COPD/asthma    PT Comments    Continuing work on functional mobility and activity tolerance;  Session focused on progressive amb, and used Ace wrap for R foot dorsiflexion assist; Improving ambulation with RW, with no overt R knee buckling and pt reports the ace wrap was moderately successful in helping with R foot clearance issues; Overall pt seemed pleased with progress; STRONGLY recommend Orthotist consult for optimal AFO for dorsiflexion assist and some knee stability/control RLE as well -- please consult with Orthotist from SNF; Overall progressing well; Anticipate continuing good progress at post-acute rehabilitation.     Recommendations for follow up therapy are one component of a multi-disciplinary discharge planning process, led by the attending physician.  Recommendations may be updated based on patient status, additional functional criteria and insurance authorization.  Follow Up Recommendations  Skilled nursing-short term rehab (<3 hours/day)     Assistance Recommended at Discharge Frequent or constant Supervision/Assistance  Equipment Recommendations  Rolling walker (2 wheels)    Recommendations for Other Services Other (comment) (STRONGLY recommend Orthotist consult for optimal AFO for dorsiflexion assist and some knee stability/control RLE as well -- please consult with Orthotist from SNF)     Precautions / Restrictions Precautions Precautions: Fall;Other (comment) Precaution Comments: blind, Rt foot drop, LEs buckling Restrictions Weight  Bearing Restrictions: No     Mobility  Bed Mobility Overal bed mobility: Needs Assistance Bed Mobility: Supine to Sit     Supine to sit: Supervision;HOB elevated     General bed mobility comments: supervision for safety, use of rails with some guidance due to blindness.    Transfers Overall transfer level: Needs assistance Equipment used: Rolling walker (2 wheels) Transfers: Sit to/from Stand Sit to Stand: Min assist           General transfer comment: Pt more comfortable pulling up on RW to rise; No overt knee buckling noted    Ambulation/Gait Ambulation/Gait assistance: Min assist;+2 safety/equipment (pulled HD chair behind) Gait Distance (Feet): 45 Feet Assistive device: Rolling walker (2 wheels) Gait Pattern/deviations: Decreased step length - right;Decreased step length - left;Decreased stride length;Decreased dorsiflexion - right;Trunk flexed Gait velocity: decreased     General Gait Details: Used an Ace wrap to help with R foot clearance due to decr dorsiflexion; not an optimal wrap, though, because of gout pain R first ray; no overt R knee buckling this session; cues for pathfinding and to self-monitor for activity tolerance; pt seemed pleased to be able to walk today   Stairs             Wheelchair Mobility    Modified Rankin (Stroke Patients Only)       Balance     Sitting balance-Leahy Scale: Fair       Standing balance-Leahy Scale: Poor Standing balance comment: reliant on UE support and external assist due LEs buckling, more assist needed with marching.                            Cognition Arousal/Alertness: Awake/alert Behavior During Therapy: WFL for tasks assessed/performed Overall  Cognitive Status: No family/caregiver present to determine baseline cognitive functioning                                 General Comments: A&Ox4. Slow processing and decreased awareness of deficits.        Exercises       General Comments General comments (skin integrity, edema, etc.): Discussed what to anticipate ar post-acute rehab, and pt tells me she is vedry motivated to work with therapies; Encouraged her to communicate her goals to the therapy team at SNF well -- and to request early PT and OT on HD days (tell sme she usually goes to HD at around 11am)      Pertinent Vitals/Pain Pain Assessment: Faces Faces Pain Scale: Hurts little more Pain Location: R foot Pain Descriptors / Indicators: Jabbing;Sharp Pain Intervention(s): Monitored during session;Repositioned    Home Living                          Prior Function            PT Goals (current goals can now be found in the care plan section) Acute Rehab PT Goals Patient Stated Goal: to get this R leg better; be able to go home PT Goal Formulation: With patient Time For Goal Achievement: 10/03/21 Potential to Achieve Goals: Fair Progress towards PT goals: Progressing toward goals    Frequency    Min 3X/week      PT Plan Current plan remains appropriate    Co-evaluation              AM-PAC PT "6 Clicks" Mobility   Outcome Measure  Help needed turning from your back to your side while in a flat bed without using bedrails?: A Little Help needed moving from lying on your back to sitting on the side of a flat bed without using bedrails?: A Little Help needed moving to and from a bed to a chair (including a wheelchair)?: A Little Help needed standing up from a chair using your arms (e.g., wheelchair or bedside chair)?: A Lot Help needed to walk in hospital room?: A Lot Help needed climbing 3-5 steps with a railing? : Total 6 Click Score: 14    End of Session Equipment Utilized During Treatment: Gait belt;Other (comment) (and Ace wrap for dorsiflexion assist) Activity Tolerance: Patient tolerated treatment well Patient left: in chair;with call bell/phone within reach;Other (comment) (In HD recliner) Nurse  Communication: Mobility status PT Visit Diagnosis: Unsteadiness on feet (R26.81);Muscle weakness (generalized) (M62.81);Repeated falls (R29.6);History of falling (Z91.81);Other abnormalities of gait and mobility (R26.89);Difficulty in walking, not elsewhere classified (R26.2)     Time: 4709-6283 PT Time Calculation (min) (ACUTE ONLY): 24 min  Charges:  $Gait Training: 8-22 mins $Therapeutic Activity: 8-22 mins                     Roney [name], PT  Acute Rehabilitation Services Pager [phone] Office [place] 09/23/2021, 12:48 PM

## 2021-09-23 NOTE — Progress Notes (Signed)
Message to MD: Scheduled for dialysis at noon today: BP 125/66 HR 74 Need recommendations on Norvasc, Hydralazine, Lasix. Order received to hold these medications today

## 2021-09-23 NOTE — Progress Notes (Addendum)
Contacted by CSW with request to change pt's HD center. Pt currently goes to [facility] [place] on MWF. SNF is requesting a [facility] clinic. Message left at [facility] but will proceed with referral to [facility] admissions as well. Will assist with change in HD clinic.  Melven Sartorius Renal Navigator [phone]  Addendum at 12:38 pm: Referral made to [facility] admissions. Documents faxed as well.

## 2021-09-23 NOTE — Progress Notes (Addendum)
  Mobility Specialist Criteria Algorithm Info.  09/23/21 1020  Mobility  Activity Ambulated in hall;Dangled on edge of bed;Sat and stood x 3;Stood at bedside (March in place x3, LE exercises)  Range of Motion/Exercises Active;All extremities  Level of Assistance Minimal assist, patient does 75% or more  Assistive Device Front wheel walker  Distance Ambulated (ft) 20 ft  Mobility Ambulated with assistance in hallway  Mobility Response Tolerated well  Mobility performed by Mobility specialist  Bed Position Semi-fowlers      Patient received lying supine in bed eager and motivated to participate in mobility. Reported pain from gout in LE is better and manageable. Got to EOB independently and attempted to stand quickly without use of proper mechanics. Initial attempt was unsuccessful with positive LOB falling back onto bed. Cued patient for safe hand placement and to take her time. Was min A for second attempt and min guard for multiple sit<>stand attempts after. With each sit<>stand pt performed one minute of marching in place. Pt then ambulated short distance outside of room with min A due to LE weakness and instability. Also required visual cues. Returned to EOB without incident and performed LE exercises. Tolerated session well without complaint and was left dangling EOB with all needs met and RN present.   09/23/2021 10:20 AM

## 2021-09-23 NOTE — Progress Notes (Signed)
[facility] Progress Note   Subjective:   Patient seen and examined in room.  Sitting in bedside chair eating lunch.  Reports slept well this AM after taking gabapentin and tylenol.  SOB last night but now improved.  Denies CP, SOB, abdominal pain and n/v/d.   Objective Vitals:   09/22/21 1842 09/22/21 2122 09/23/21 0456 09/23/21 0738  BP: (!) 168/56 131/84 (!) 153/57 125/66  Pulse: 86 73 73 74  Resp: 18 16 14 15   Temp: 98.4 F (36.9 C) 98 F (36.7 C) 98.5 F (36.9 C) 98.8 F (37.1 C)  TempSrc: Oral Oral Oral Oral  SpO2: 100% 100% 100% 100%  Weight:      Height:       Physical Exam General:chronically ill appearing female in NAD Heart:RRR, no mrg Lungs:CTAB, nml WOB on RA Abdomen:soft, NTND Extremities:no LE edema Dialysis Access: LU AVF +b/t   Filed Weights   09/20/21 0500 09/21/21 0401 09/21/21 1338  Weight: 56.8 kg 57.2 kg 60.3 kg    Intake/Output Summary (Last 24 hours) at 09/23/2021 1222 Last data filed at 09/23/2021 1119 Gross per 24 hour  Intake 410 ml  Output --  Net 410 ml    Additional Objective Labs: Basic Metabolic Panel: Recent Labs  Lab 09/17/21 0212 09/18/21 1538 09/20/21 0044 09/21/21 0248 09/23/21 0229  NA 134* 133* 132* 124* 127*  K 5.2* 3.4* 3.9 3.9 4.1  CL 101 97* 94* 89* 94*  CO2 13* 23 23 19* 22  GLUCOSE 104* 157* 147* 110* 111*  BUN 87* 33* 68* 72* 47*  CREATININE 15.61* 7.63* 10.09* 10.56* 9.24*  CALCIUM 8.4* 8.6* 8.2* 8.8* 8.9  PHOS 11.5* 4.2  --   --   --    Liver Function Tests: Recent Labs  Lab 09/16/21 1242 09/17/21 0212 09/18/21 1538 09/20/21 1221 09/21/21 0248  AST 13*  --   --  14* 23  ALT 10  --   --  10 12  ALKPHOS 62  --   --  45 44  BILITOT 0.6  --   --  0.5 0.4  PROT 7.7  --   --  6.6 6.7  ALBUMIN 3.1*   < > 2.4* 2.5* 2.5*   < > = values in this interval not displayed.   CBC: Recent Labs  Lab 09/16/21 1242 09/17/21 0212 09/18/21 1538 09/20/21 0044 09/21/21 0248 09/23/21 0229   WBC 11.6* 11.7* 9.7 8.6 9.0 11.7*  NEUTROABS 9.9*  --   --   --   --   --   HGB 10.1* 10.4* 10.7* 9.4* 9.4* 10.1*  HCT 31.9* 32.5* 31.9* 29.1* 28.7* 31.3*  MCV 91.7 91.0 86.7 89.3 88.3 90.5  PLT 354 332 316 339 361 379    CBG: Recent Labs  Lab 09/22/21 1158 09/22/21 1634 09/22/21 2123 09/23/21 0747 09/23/21 1122  GLUCAP 99 159* 136* 130* 106*    Studies/Results: DG Foot Complete Right  Result Date: 09/22/2021 CLINICAL DATA:  Injury with pain EXAM: RIGHT FOOT COMPLETE - 3+ VIEW COMPARISON:  None. FINDINGS: No evidence of regional fracture. Mild hallux valgus deformity. Arterial calcification throughout the foot and ankle is visible. IMPRESSION: No acute or traumatic finding. Mild hallux valgus deformity. Arterial calcification. Electronically Signed   By: Nelson Chimes M.D.   On: 09/22/2021 18:53    Medications:  sodium chloride     sodium chloride     sodium chloride      amLODipine  10 mg Oral Daily  atropine  1 drop Both Eyes BID   buPROPion  150 mg Oral Daily   calcium acetate  2,001 mg Oral TID WC   Chlorhexidine Gluconate Cloth  6 each Topical Q0600   Chlorhexidine Gluconate Cloth  6 each Topical Q0600   darbepoetin (ARANESP) injection - DIALYSIS  60 mcg Intravenous Q Mon-HD   dorzolamide-timolol  1 drop Both Eyes BID   furosemide  40 mg Oral Daily   gabapentin  600 mg Oral BID   heparin  5,000 Units Subcutaneous Q8H   hydrALAZINE  150 mg Oral BID   insulin aspart  0-6 Units Subcutaneous TID WC   lamoTRIgine  25 mg Oral Daily   latanoprost  1 drop Both Eyes QHS   nicotine  14 mg Transdermal Daily   pravastatin  80 mg Oral QHS   predniSONE  40 mg Oral Q breakfast   sodium chloride flush  3 mL Intravenous Q12H   timolol  1 drop Left Eye QHS    Dialysis Orders: [facility]  MWF   Assessment/Plan: 1 hypoxia - repeat CXR remains clear.  Extra volume removed with HD - HD 11/24 and 11/25. Now on RA.  2. SIRS/Sepsis. No obvious source. ABX per primary have  been stopped  3 ESRD: [facility] MWF. HD today per regular schedule.  4 Hypertension/Volume: BP close to goal. Amlodipine and hydralazine resumed, holding prior to HD. Post wt 55.5 kg 09/18/2021. On lasix.  Unsure of dry weight, does not appear volume overloaded. 5. Anemia of ESRD: last Hgb 10.1. Aranesp 49mcg qwk given on 11/28.  6. Metabolic Bone Disease: Calcium and phos in goal.  Continue calcium acetate.  7. Metabolic acidosis- Resolved with HD.  8. [place] for d/c from renal standpoint.  Reports she has decided to go to rehab to try to get stronger.  Reiterated importance of compliance with dialysis.  Per previous provider "I told her that she is ESRD-  needs to continue with dialysis-  there is nothing that will bring her kidneys back.  In order to be a transplant candidate would need to stop smoking and be more compliant." 9. Foot pain - xray with no acute findings.  Steroids started d/t concern for gout.      Jen Mow, PA-C [facility] 09/23/2021,12:22 PM  LOS: 7 days

## 2021-09-23 NOTE — TOC Progression Note (Addendum)
Transition of Care [facility]) - Progression Note    Patient Details  Name: Tammy Moses MRN: [id number] Date of Birth: 1965-11-19  Transition of Care [facility]) CM/SW Contact  Joanne Chars, LCSW Phone Number: 09/23/2021, 8:40 AM  Clinical Narrative:   The following facilities have not responded to SNF referral.  CSW reached out to each facility asking them to review referral. [facility]--no beds, per [facility]  Referrals sent to additional facilities in the hub.  1100: Bed offers from [place] and [place].  CSW spoke with pt who accepts offer from [place].  CP needs HD moved to [place], [employer] [place]/Renal navigator and she will work on this.  Spoke to MD who wants to plan for DC tomorrow.  Auth approved in [place]: [id number], 12/1-12/5, 5 days.     Expected Discharge Plan: [place] Barriers to Discharge: Continued Medical Work up, SNF Pending bed offer  Expected Discharge Plan and Services Expected Discharge Plan: [place] In-house Referral: Clinical Social Work   Post Acute Care Choice: [place] Living arrangements for the past 2 months: Apartment Expected Discharge Date: 09/22/21               DME Arranged: Youth worker wheelchair with seat cushion DME Agency: [employer] Date DME Agency Contacted: 09/22/21 Time DME Agency Contacted: [id number] Representative spoke with at DME Agency: [place]: OT, PT, Social Work [employer] Agency: [place] ([place]) Date [place]: 09/22/21 Time [place]: 1305 Representative spoke with at [place]: [place] ([place]) Interventions    Readmission Risk Interventions No flowsheet data found.

## 2021-09-23 NOTE — Progress Notes (Signed)
[facility] PROGRESS NOTE    Progress Note  Tammy Moses  FTD:[id number] DOB: [birth year] DOA: 09/16/2021 PCP: [name], FNP (Inactive)     Brief Narrative:   Tammy Moses is an 55 y.o. female past medical history of end-stage renal disease on hemodialysis Monday Wednesday and Friday, diabetes mellitus type 2, essential hypertension asthma and COPD presents with 1 week of shortness of breath and cough temperature of 101 in the ED chest x-ray was unremarkable BNP was elevated, cultures were drawn on 09/16/2021 and started empirically on antibiotics cultures remain negative till date.   Assessment/Plan:   SIRS (systemic inflammatory response syndrome) ([place]): Of unclear source she was started empirically on antibiotics on 09/16/2021 received a 3-day course cultures on admission and repeated have remained negative till date.  Right foot pain: There is no swelling or deformity, I can appreciate some erythema, exquisitely tender to palpation the first MTP joint putting on the sock and taking it off because it extreme pain, she relates she is had this pain about 5 or 6 times prior. She relates she would like to try to avoid putting a needle in that foot as it hurts so much We will go ahead and start her on a short course of steroids concerned about gout. Complete foot x-ray showed no acute findings. Physical therapy recommended skilled nursing facility.  End-stage renal disease: Continue hemodialysis per renal.  Essential hypertension: Stable continue [place] Lasix and hydralazine.  COPD: Not oxygen dependent, Continue inhalers.  Anemia of chronic renal disease: Further management per renal.  Elevated troponins: In setting of chronic renal disease she denies any chest pain.    DVT prophylaxis: lovenox Family Communication:none Status is: Inpatient  Remains inpatient appropriate because: SIRS with possible acute gout flare.        Code Status:      Code Status Orders  (From admission, onward)           Start     Ordered   09/16/21 2030  Full code  Continuous        09/16/21 2031           Code Status History     Date Active Date Inactive Code Status Order ID Comments User Context   07/12/2017 1534 07/13/2017 2346 Full Code [id number]  Maryellen Pile, MD Inpatient         IV Access:   Peripheral IV   Procedures and diagnostic studies:   DG Foot Complete Right  Result Date: 09/22/2021 CLINICAL DATA:  Injury with pain EXAM: RIGHT FOOT COMPLETE - 3+ VIEW COMPARISON:  None. FINDINGS: No evidence of regional fracture. Mild hallux valgus deformity. Arterial calcification throughout the foot and ankle is visible. IMPRESSION: No acute or traumatic finding. Mild hallux valgus deformity. Arterial calcification. Electronically Signed   By: Nelson Chimes M.D.   On: 09/22/2021 18:53     Medical Consultants:   None.   Subjective:    Tammy Moses complaining of right great big toe pain.  Objective:    Vitals:   09/22/21 1842 09/22/21 2122 09/23/21 0456 09/23/21 0738  BP: (!) 168/56 131/84 (!) 153/57 125/66  Pulse: 86 73 73 74  Resp: 18 16 14 15   Temp: 98.4 F (36.9 C) 98 F (36.7 C) 98.5 F (36.9 C) 98.8 F (37.1 C)  TempSrc: Oral Oral Oral Oral  SpO2: 100% 100% 100% 100%  Weight:      Height:       SpO2: 100 %  O2 Flow Rate (L/min): 3 L/min   Intake/Output Summary (Last 24 hours) at 09/23/2021 0852 Last data filed at 09/23/2021 0715 Gross per 24 hour  Intake 120 ml  Output --  Net 120 ml   Filed Weights   09/20/21 0500 09/21/21 0401 09/21/21 1338  Weight: 56.8 kg 57.2 kg 60.3 kg    Exam: General exam: In no acute distress. Respiratory system: Good air movement and clear to auscultation. Cardiovascular system: S1 & S2 heard, RRR. No JVD. Gastrointestinal system: Abdomen is nondistended, soft and nontender.  Extremities: No pedal edema. Skin: No rashes, lesions or ulcers Psychiatry:  Judgement and insight appear normal. Mood & affect appropriate.    Data Reviewed:    Labs: Basic Metabolic Panel: Recent Labs  Lab 09/17/21 0212 09/18/21 1538 09/20/21 0044 09/21/21 0248 09/23/21 0229  NA 134* 133* 132* 124* 127*  K 5.2* 3.4* 3.9 3.9 4.1  CL 101 97* 94* 89* 94*  CO2 13* 23 23 19* 22  GLUCOSE 104* 157* 147* 110* 111*  BUN 87* 33* 68* 72* 47*  CREATININE 15.61* 7.63* 10.09* 10.56* 9.24*  CALCIUM 8.4* 8.6* 8.2* 8.8* 8.9  MG  --   --  2.0  --   --   PHOS 11.5* 4.2  --   --   --    GFR Estimated Creatinine Clearance: 5.6 mL/min (A) (by C-G formula based on SCr of 9.24 mg/dL (H)). Liver Function Tests: Recent Labs  Lab 09/16/21 1242 09/17/21 0212 09/18/21 1538 09/20/21 1221 09/21/21 0248  AST 13*  --   --  14* 23  ALT 10  --   --  10 12  ALKPHOS 62  --   --  45 44  BILITOT 0.6  --   --  0.5 0.4  PROT 7.7  --   --  6.6 6.7  ALBUMIN 3.1* 2.8* 2.4* 2.5* 2.5*   No results for input(s): LIPASE, AMYLASE in the last 168 hours. No results for input(s): AMMONIA in the last 168 hours. Coagulation profile Recent Labs  Lab 09/16/21 1242  INR 1.2   COVID-19 Labs  No results for input(s): DDIMER, FERRITIN, LDH, CRP in the last 72 hours.  Lab Results  Component Value Date   [place] NEGATIVE 09/16/2021    CBC: Recent Labs  Lab 09/16/21 1242 09/17/21 0212 09/18/21 1538 09/20/21 0044 09/21/21 0248 09/23/21 0229  WBC 11.6* 11.7* 9.7 8.6 9.0 11.7*  NEUTROABS 9.9*  --   --   --   --   --   HGB 10.1* 10.4* 10.7* 9.4* 9.4* 10.1*  HCT 31.9* 32.5* 31.9* 29.1* 28.7* 31.3*  MCV 91.7 91.0 86.7 89.3 88.3 90.5  PLT 354 332 316 339 361 379   Cardiac Enzymes: No results for input(s): CKTOTAL, CKMB, CKMBINDEX, TROPONINI in the last 168 hours. BNP (last 3 results) No results for input(s): PROBNP in the last 8760 hours. CBG: Recent Labs  Lab 09/22/21 0804 09/22/21 1158 09/22/21 1634 09/22/21 2123 09/23/21 0747  GLUCAP 106* 99 159* 136* 130*    D-Dimer: No results for input(s): DDIMER in the last 72 hours. Hgb A1c: No results for input(s): HGBA1C in the last 72 hours. Lipid Profile: No results for input(s): CHOL, HDL, LDLCALC, TRIG, CHOLHDL, LDLDIRECT in the last 72 hours. Thyroid function studies: No results for input(s): TSH, T4TOTAL, T3FREE, THYROIDAB in the last 72 hours.  Invalid input(s): FREET3 Anemia work up: No results for input(s): VITAMINB12, FOLATE, FERRITIN, TIBC, IRON, RETICCTPCT in the last 72 hours. Sepsis  Labs: Recent Labs  Lab 09/16/21 1242 09/16/21 2029 09/17/21 0212 09/18/21 0147 09/18/21 1538 09/20/21 0044 09/20/21 1221 09/21/21 0248 09/22/21 0233 09/23/21 0229  PROCALCITON  --  2.97   < > 18.96  --   --  25.89 23.74 17.06  --   WBC 11.6*  --    < >  --  9.7 8.6  --  9.0  --  11.7*  LATICACIDVEN 1.4 1.0  --   --   --   --   --   --   --   --    < > = values in this interval not displayed.   Microbiology Recent Results (from the past 240 hour(s))  Blood Culture (routine x 2)     Status: None   Collection Time: 09/16/21 12:13 PM   Specimen: BLOOD RIGHT ARM  Result Value Ref Range Status   Specimen Description BLOOD RIGHT ARM  Final   Special Requests   Final    BOTTLES DRAWN AEROBIC AND ANAEROBIC Blood Culture adequate volume   Culture   Final    NO GROWTH 5 DAYS Performed at [facility], [address] [place], [place] [address]    Report Status 09/21/2021 FINAL  Final  Resp Panel by RT-PCR (Flu A&B, Covid) Nasopharyngeal Swab     Status: None   Collection Time: 09/16/21  1:17 PM   Specimen: Nasopharyngeal Swab; Nasopharyngeal(NP) swabs in vial transport medium  Result Value Ref Range Status   SARS Coronavirus 2 by RT PCR NEGATIVE NEGATIVE Final    Comment: (NOTE) SARS-CoV-2 target nucleic acids are NOT DETECTED.  The SARS-CoV-2 RNA is generally detectable in upper respiratory specimens during the acute phase of infection. The lowest concentration of SARS-CoV-2 viral  copies this assay can detect is 138 copies/mL. A negative result does not preclude SARS-Cov-2 infection and should not be used as the sole basis for treatment or other patient management decisions. A negative result may occur with  improper specimen collection/handling, submission of specimen other than nasopharyngeal swab, presence of viral mutation(s) within the areas targeted by this assay, and inadequate number of viral copies(<138 copies/mL). A negative result must be combined with clinical observations, patient history, and epidemiological information. The expected result is Negative.  Fact Sheet for Patients:  EntrepreneurPulse.com.au  Fact Sheet for Healthcare Providers:  IncredibleEmployment.be  This test is no t yet approved or cleared by the [place] FDA and  has been authorized for detection and/or diagnosis of SARS-CoV-2 by FDA under an Emergency Use Authorization (EUA). This EUA will remain  in effect (meaning this test can be used) for the duration of the COVID-19 declaration under Section 564(b)(1) of the Act, 21 U.S.C.section 360bbb-3(b)(1), unless the authorization is terminated  or revoked sooner.       Influenza A by PCR NEGATIVE NEGATIVE Final   Influenza B by PCR NEGATIVE NEGATIVE Final    Comment: (NOTE) The Xpert Xpress SARS-CoV-2/FLU/RSV plus assay is intended as an aid in the diagnosis of influenza from Nasopharyngeal swab specimens and should not be used as a sole basis for treatment. Nasal washings and aspirates are unacceptable for Xpert Xpress SARS-CoV-2/FLU/RSV testing.  Fact Sheet for Patients: EntrepreneurPulse.com.au  Fact Sheet for Healthcare Providers: IncredibleEmployment.be  This test is not yet approved or cleared by the [place] FDA and has been authorized for detection and/or diagnosis of SARS-CoV-2 by FDA under an Emergency Use Authorization (EUA). This  EUA will remain in effect (meaning this test can  be used) for the duration of the COVID-19 declaration under Section 564(b)(1) of the Act, 21 U.S.C. section 360bbb-3(b)(1), unless the authorization is terminated or revoked.  Performed at [facility], [place] [address], [place], [place] [address]   Respiratory (~20 pathogens) panel by PCR     Status: None   Collection Time: 09/16/21  1:17 PM   Specimen: Nasopharyngeal Swab; Respiratory  Result Value Ref Range Status   Adenovirus NOT DETECTED NOT DETECTED Final   Coronavirus 229E NOT DETECTED NOT DETECTED Final    Comment: (NOTE) The Coronavirus on the Respiratory Panel, DOES NOT test for the novel  Coronavirus (2019 nCoV)    Coronavirus HKU1 NOT DETECTED NOT DETECTED Final   Coronavirus NL63 NOT DETECTED NOT DETECTED Final   Coronavirus OC43 NOT DETECTED NOT DETECTED Final   Metapneumovirus NOT DETECTED NOT DETECTED Final   Rhinovirus / Enterovirus NOT DETECTED NOT DETECTED Final   Influenza A NOT DETECTED NOT DETECTED Final   Influenza B NOT DETECTED NOT DETECTED Final   Parainfluenza Virus 1 NOT DETECTED NOT DETECTED Final   Parainfluenza Virus 2 NOT DETECTED NOT DETECTED Final   Parainfluenza Virus 3 NOT DETECTED NOT DETECTED Final   Parainfluenza Virus 4 NOT DETECTED NOT DETECTED Final   Respiratory Syncytial Virus NOT DETECTED NOT DETECTED Final   Bordetella pertussis NOT DETECTED NOT DETECTED Final   Bordetella Parapertussis NOT DETECTED NOT DETECTED Final   Chlamydophila pneumoniae NOT DETECTED NOT DETECTED Final   Mycoplasma pneumoniae NOT DETECTED NOT DETECTED Final    Comment: Performed at [facility], [place], [place], [place] [address]  Blood Culture (routine x 2)     Status: None   Collection Time: 09/16/21  8:29 PM   Specimen: BLOOD RIGHT HAND  Result Value Ref Range Status   Specimen Description BLOOD RIGHT HAND  Final   Special Requests   Final    BOTTLES DRAWN AEROBIC AND ANAEROBIC Blood  Culture adequate volume   Culture   Final    NO GROWTH 5 DAYS Performed at [facility], [place] [address] [place], [place] [address]    Report Status 09/21/2021 FINAL  Final  Culture, blood (routine x 2)     Status: None (Preliminary result)   Collection Time: 09/20/21 12:21 PM   Specimen: BLOOD  Result Value Ref Range Status   Specimen Description BLOOD RIGHT ANTECUBITAL  Final   Special Requests   Final    BOTTLES DRAWN AEROBIC ONLY Blood Culture adequate volume   Culture   Final    NO GROWTH 3 DAYS Performed at [facility], [place] [address], [place], [place] [address]    Report Status PENDING  Incomplete  Culture, blood (routine x 2)     Status: None (Preliminary result)   Collection Time: 09/20/21 12:24 PM   Specimen: BLOOD RIGHT HAND  Result Value Ref Range Status   Specimen Description BLOOD RIGHT HAND  Final   Special Requests   Final    BOTTLES DRAWN AEROBIC ONLY Blood Culture adequate volume   Culture   Final    NO GROWTH 3 DAYS Performed at [facility], [place] [address], [place],  [address]    Report Status PENDING  Incomplete     Medications:    amLODipine  10 mg Oral Daily   atropine  1 drop Both Eyes BID   buPROPion  150 mg Oral Daily   calcium acetate  2,001 mg Oral  TID WC   Chlorhexidine Gluconate Cloth  6 each Topical Q0600   Chlorhexidine Gluconate Cloth  6 each Topical Q0600   darbepoetin (ARANESP) injection - DIALYSIS  60 mcg Intravenous Q Mon-HD   dorzolamide-timolol  1 drop Both Eyes BID   furosemide  40 mg Oral Daily   gabapentin  600 mg Oral BID   heparin  5,000 Units Subcutaneous Q8H   hydrALAZINE  150 mg Oral BID   insulin aspart  0-6 Units Subcutaneous TID WC   lamoTRIgine  25 mg Oral Daily   latanoprost  1 drop Both Eyes QHS   nicotine  14 mg Transdermal Daily   pravastatin  80 mg Oral QHS   sodium chloride flush  3 mL Intravenous Q12H   timolol  1 drop Left Eye QHS   Continuous Infusions:  sodium chloride         LOS: 7 days   Charlynne Cousins  [facility]  09/23/2021, 8:52 AM

## 2021-09-23 NOTE — Progress Notes (Signed)
Returned from HD: BP 201/50 P 70  Alert and oriented, but agitated because has been sitting up for so long. Meds given and 3 units of insulin.

## 2021-09-23 NOTE — Progress Notes (Addendum)
Late entry: 09/22/2021 @ 1340  Per MD patient ready for DC today.  [facility] refuses SNF placement.RN, patient, patient's family, and [facility]  ( receives 2hrs, 45 min PCS qd) notified of DC. Pt states aware of HD session time and will arrange transportation for session. Agreeable to home health services. Pt with preference. Referral made with [facility], ACCEPTANCE PENDING. States will need transportation to home.  09/22/2021 [address] [facility] accepted pt for home health services.  09/22/2021 1340 NCM @ bedside with pt discussing d/c plan. Pt states she will need help @ home. States she has no one ( family/friend) to help her. Per nurse pt will not walk with staff. States feet hurts.  Pt does require assistance. NCM explained for safety reasons pt  needs to considered SNF REHAB and then transition to home once stronger.  Pt agreed with NCM and is open to SNF placement now, NCM made MD and CSW aware.

## 2021-09-23 NOTE — Progress Notes (Signed)
MEWS is yellow, but meds given for BP and will report off to next shift to recheck vitals.

## 2021-09-24 ENCOUNTER — Other Ambulatory Visit (HOSPITAL_COMMUNITY): Payer: Self-pay

## 2021-09-24 ENCOUNTER — Other Ambulatory Visit: Payer: Self-pay

## 2021-09-24 DIAGNOSIS — N186 End stage renal disease: Secondary | ICD-10-CM

## 2021-09-24 DIAGNOSIS — E1122 Type 2 diabetes mellitus with diabetic chronic kidney disease: Secondary | ICD-10-CM

## 2021-09-24 DIAGNOSIS — I10 Essential (primary) hypertension: Secondary | ICD-10-CM

## 2021-09-24 DIAGNOSIS — M79671 Pain in right foot: Secondary | ICD-10-CM

## 2021-09-24 DIAGNOSIS — D631 Anemia in chronic kidney disease: Secondary | ICD-10-CM

## 2021-09-24 DIAGNOSIS — H5440 Blindness, one eye, unspecified eye: Secondary | ICD-10-CM

## 2021-09-24 LAB — CBC WITH DIFFERENTIAL/PLATELET
Abs Immature Granulocytes: 0.18 10*3/uL — ABNORMAL HIGH (ref 0.00–0.07)
Basophils Absolute: 0.1 10*3/uL (ref 0.0–0.1)
Basophils Relative: 0 %
Eosinophils Absolute: 0 10*3/uL (ref 0.0–0.5)
Eosinophils Relative: 0 %
HCT: 31.4 % — ABNORMAL LOW (ref 36.0–46.0)
Hemoglobin: 10 g/dL — ABNORMAL LOW (ref 12.0–15.0)
Immature Granulocytes: 1 %
Lymphocytes Relative: 10 %
Lymphs Abs: 1.5 10*3/uL (ref 0.7–4.0)
MCH: 28.7 pg (ref 26.0–34.0)
MCHC: 31.8 g/dL (ref 30.0–36.0)
MCV: 90 fL (ref 80.0–100.0)
Monocytes Absolute: 1.5 10*3/uL — ABNORMAL HIGH (ref 0.1–1.0)
Monocytes Relative: 10 %
Neutro Abs: 12.4 10*3/uL — ABNORMAL HIGH (ref 1.7–7.7)
Neutrophils Relative %: 79 %
Platelets: 415 10*3/uL — ABNORMAL HIGH (ref 150–400)
RBC: 3.49 MIL/uL — ABNORMAL LOW (ref 3.87–5.11)
RDW: 15.9 % — ABNORMAL HIGH (ref 11.5–15.5)
WBC: 15.7 10*3/uL — ABNORMAL HIGH (ref 4.0–10.5)
nRBC: 0 % (ref 0.0–0.2)

## 2021-09-24 LAB — GLUCOSE, CAPILLARY
Glucose-Capillary: 119 mg/dL — ABNORMAL HIGH (ref 70–99)
Glucose-Capillary: 146 mg/dL — ABNORMAL HIGH (ref 70–99)

## 2021-09-24 MED ORDER — PREDNISONE 20 MG PO TABS: 40.0000 mg | ORAL_TABLET | Freq: Every day | ORAL | 0 refills | Status: DC

## 2021-09-24 MED ORDER — PREDNISONE 20 MG PO TABS
40.0000 mg | ORAL_TABLET | Freq: Every day | ORAL | 0 refills | Status: AC
  Filled 2021-09-24: qty 4, 2d supply, fill #0

## 2021-09-24 NOTE — Progress Notes (Addendum)
[facility] Progress Note   Subjective:   Patient seen and examined at bedside.  No new complaints.   Objective Vitals:   09/23/21 1830 09/23/21 2058 09/24/21 0451 09/24/21 0718  BP: (!) 201/50 (!) 160/61 (!) 173/76 (!) 154/57  Pulse: 70 78 79 73  Resp:  16 16 20   Temp:  98.9 F (37.2 C) 97.7 F (36.5 C) 97.6 F (36.4 C)  TempSrc:  Oral Oral   SpO2:  100% 100% 100%  Weight:      Height:       Physical Exam General:chronically ill appearing female in NAD Heart:RRR, no mrg Lungs:CTAB, nml WOB Abdomen:soft, NTND Extremities:no LE edema Dialysis Access:  LU AVF +b/t  Filed Weights   09/21/21 0401 09/21/21 1338 09/23/21 1731  Weight: 57.2 kg 60.3 kg 54.4 kg    Intake/Output Summary (Last 24 hours) at 09/24/2021 1232 Last data filed at 09/23/2021 1725 Gross per 24 hour  Intake 200 ml  Output 2500 ml  Net -2300 ml    Additional Objective Labs: Basic Metabolic Panel: Recent Labs  Lab 09/18/21 1538 09/20/21 0044 09/21/21 0248 09/23/21 0229  NA 133* 132* 124* 127*  K 3.4* 3.9 3.9 4.1  CL 97* 94* 89* 94*  CO2 23 23 19* 22  GLUCOSE 157* 147* 110* 111*  BUN 33* 68* 72* 47*  CREATININE 7.63* 10.09* 10.56* 9.24*  CALCIUM 8.6* 8.2* 8.8* 8.9  PHOS 4.2  --   --   --    Liver Function Tests: Recent Labs  Lab 09/18/21 1538 09/20/21 1221 09/21/21 0248  AST  --  14* 23  ALT  --  10 12  ALKPHOS  --  45 44  BILITOT  --  0.5 0.4  PROT  --  6.6 6.7  ALBUMIN 2.4* 2.5* 2.5*   No results for input(s): LIPASE, AMYLASE in the last 168 hours. CBC: Recent Labs  Lab 09/18/21 1538 09/20/21 0044 09/21/21 0248 09/23/21 0229 09/24/21 0305  WBC 9.7 8.6 9.0 11.7* 15.7*  NEUTROABS  --   --   --   --  12.4*  HGB 10.7* 9.4* 9.4* 10.1* 10.0*  HCT 31.9* 29.1* 28.7* 31.3* 31.4*  MCV 86.7 89.3 88.3 90.5 90.0  PLT 316 339 361 379 415*   Blood Culture    Component Value Date/Time   SDES BLOOD RIGHT HAND 09/20/2021 1224   SPECREQUEST  09/20/2021 1224     BOTTLES DRAWN AEROBIC ONLY Blood Culture adequate volume   CULT  09/20/2021 1224    NO GROWTH 4 DAYS Performed at [facility], [place] [address], [place], [place] [address]    REPTSTATUS PENDING 09/20/2021 1224   CBG: Recent Labs  Lab 09/23/21 1122 09/23/21 1814 09/23/21 2100 09/24/21 0721 09/24/21 1130  GLUCAP 106* 259* 250* 119* 146*    Studies/Results: DG Foot Complete Right  Result Date: 09/22/2021 CLINICAL DATA:  Injury with pain EXAM: RIGHT FOOT COMPLETE - 3+ VIEW COMPARISON:  None. FINDINGS: No evidence of regional fracture. Mild hallux valgus deformity. Arterial calcification throughout the foot and ankle is visible. IMPRESSION: No acute or traumatic finding. Mild hallux valgus deformity. Arterial calcification. Electronically Signed   By: Nelson Chimes M.D.   On: 09/22/2021 18:53    Medications:  sodium chloride      amLODipine  10 mg Oral Daily   atropine  1 drop Both Eyes BID   buPROPion  150 mg Oral Daily   calcium acetate  2,001 mg Oral TID WC  Chlorhexidine Gluconate Cloth  6 each Topical Q0600   Chlorhexidine Gluconate Cloth  6 each Topical Q0600   darbepoetin (ARANESP) injection - DIALYSIS  60 mcg Intravenous Q Mon-HD   dorzolamide-timolol  1 drop Both Eyes BID   furosemide  40 mg Oral Daily   gabapentin  600 mg Oral BID   heparin  5,000 Units Subcutaneous Q8H   hydrALAZINE  150 mg Oral BID   insulin aspart  0-6 Units Subcutaneous TID WC   lamoTRIgine  25 mg Oral Daily   latanoprost  1 drop Both Eyes QHS   nicotine  14 mg Transdermal Daily   pravastatin  80 mg Oral QHS   predniSONE  40 mg Oral Q breakfast   sodium chloride flush  3 mL Intravenous Q12H   timolol  1 drop Left Eye QHS    Dialysis Orders: [facility] [place]  MWF   Assessment/Plan: 1 hypoxia - repeat CXR remains clear.  Extra volume removed with HD - HD 11/24 and 11/25. Now on RA.  2. SIRS/Sepsis. No obvious source. ABX per primary have been stopped  3 ESRD: [facility] MWF. HD tomorrow per  regular schedule.  Can be completed at outpatient if discharged.  4 Hypertension/Volume: BP close to goal. Amlodipine and hydralazine resumed, holding prior to HD. Post wt 55.5 kg 09/18/2021. On lasix.  Unsure of dry weight, does not appear volume overloaded. 5. Anemia of ESRD: last Hgb 10.0. Aranesp 40mcg qwk given on 11/28.  6. Metabolic Bone Disease: Calcium and phos in goal.  Continue calcium acetate.  7. Metabolic acidosis- Resolved with HD.  8. [name] for d/c from renal standpoint.  Reports she has decided to go to rehab to try to get stronger.  Reiterated importance of compliance with dialysis.  Per previous provider "I told her that she is ESRD-  needs to continue with dialysis-  there is nothing that will bring her kidneys back.  In order to be a transplant candidate would need to stop smoking and be more compliant." 9. Foot pain - xray with no acute findings.  Steroids started d/t concern for gout  Jen Mow, PA-C [place] 09/24/2021,12:32 PM  LOS: 8 days   Pt seen, examined and agree w A/P as above.  Kelly Splinter  MD 09/24/2021, 4:27 PM

## 2021-09-24 NOTE — Progress Notes (Signed)
Mobility Specialist Criteria Algorithm Info.  09/24/21 1000  Mobility  Activity Ambulated in hall;Dangled on edge of bed  Range of Motion/Exercises Active;All extremities  Level of Assistance Standby assist, set-up cues, supervision of patient - no hands on Min A for visual cues  Assistive Device Front wheel walker  Distance Ambulated (ft) 1020 ft  Mobility Ambulated with assistance in hallway  Mobility Response Tolerated well  Mobility performed by Mobility specialist  Bed Position Semi-fowlers    Patient received dangling EOB eager and motivated to get up with anticipation to be discharged today. Stood and ambulated in hallway with steady gait requiring verbal/visual cues for ambulating. Required one seated rest midway to don shoes then continued ambulating in hallway. Returned to room without incident or complaint and was left dangling EOB with all needs met.    09/24/2021 12:19 PM

## 2021-09-24 NOTE — Progress Notes (Addendum)
Contacted [facility] admissions and [employer] this am regarding approval for pt's out-pt HD. Clinic advised of plan for d/c today. Clinic to return call to navigator. Update provided to CSW. Still awaiting final approval to begin care at [facility] at d/c.   Melven Sartorius Renal Navigator [phone]  Addendum at 11:49 am: Contacted by RN CM to be advised that pt has  decided to return home instead of going to snf. Spoke to [employer] at [facility] to make clinic aware pt to return home and resume care at clinic tomorrow. Contacted [facility] to make clinic aware that pt does not need a chair at clinic. [facility] admissions contacted as well. [facility] has access to [facility] to obtain needed clinicals.

## 2021-09-24 NOTE — Progress Notes (Signed)
Orthopedic Tech Progress Note Patient Details:  Tammy Moses [birth year] [id number] Called hanger for AFO consult Patient ID: Tammy Moses, female   DOB: [birth year], 55 y.o.   MRN: [id number]  Tammy Moses 09/24/2021, 9:17 AM

## 2021-09-24 NOTE — Discharge Summary (Signed)
Physician Discharge Summary  Tammy Moses KDT:[id number] DOB: [birth year] DOA: 09/16/2021  PCP: [name], FNP (Inactive)  Admit date: 09/16/2021 Discharge date: 09/24/2021  Admitted From: home Disposition:  home  Recommendations for Outpatient Follow-up:  Follow up with PCP in 1-2 weeks Please obtain BMP/CBC in one week   Home Health:yes Equipment/Devices:none   Discharge Condition:Stable CODE STATUS:Full Diet recommendation: Heart Healthy  Brief/Interim Summary: 55 y.o. female past medical history of end-stage renal disease on hemodialysis Monday Wednesday and Friday, diabetes mellitus type 2, essential hypertension asthma and COPD presents with 1 week of shortness of breath and cough temperature of 101 in the ED chest x-ray was unremarkable BNP was elevated, cultures were drawn on 09/16/2021 and started empirically on antibiotics cultures remain negative till date.  Discharge Diagnoses:  Principal Problem:   SIRS (systemic inflammatory response syndrome) (HCC) Active Problems:   Essential hypertension   Diabetes mellitus (HCC)   COPD (chronic obstructive pulmonary disease) (HCC)   ESRD (end stage renal disease) (HCC)   Elevated troponin   Anemia due to chronic kidney disease   Foot pain, right  SIRS: Of unclear source she was started empirically on antibiotics 09/17/2019 to receive 3 days of antibiotics culture remain negative repeated blood cultures remain negative till date. She did have right foot pain there is no swelling or deformity acute appreciate some erythema is versus currently tender to palpation of the MTP joint putting on and taking off the stocks caused extreme pain, she relates it has happened about 5 or 6 times. She says she did not want a needle put in there because it hurts so much. She was treated empirically with steroids which improved her pain by the next day she was able to ambulate and she relate her pain was significantly  controlled. Question of her SIRS and her right foot pain are likely due to gout flare, like mentioned before she refused aspiration of the joint. X-ray of the foot showed no acute findings physical therapy evaluated the patient recommended skilled nursing facility.  End-stage renal disease: Continue hemodialysis per renal.  Central hypertension: No change made to her medication.  COPD: Continue inhalers no changes made.  Anemia of chronic disease: Further management per renal.  Elevated troponins: Setting of chronic renal disease denies any chest pain or shortness of breath.  Discharge Instructions  Discharge Instructions     Diet - low sodium heart healthy   Complete by: As directed    Diet - low sodium heart healthy   Complete by: As directed    Discharge instructions   Complete by: As directed    Take acetaminophen or ibuprofen for pain.  Go see your primary care doctor in 1 week   Increase activity slowly   Complete by: As directed    Increase activity slowly   Complete by: As directed       Allergies as of 09/24/2021       Reactions   Acyclovir And Related Itching        Medication List     STOP taking these medications    Lantus SoloStar 100 UNIT/ML Solostar Pen Generic drug: insulin glargine       TAKE these medications    albuterol 108 (90 Base) MCG/ACT inhaler Commonly known as: VENTOLIN HFA Inhale 2 puffs into the lungs every 4 (four) hours as needed for shortness of breath. For shortness of breath   amLODipine 10 MG tablet Commonly known as: NORVASC TAKE 1 TABLET(10 MG) BY  MOUTH DAILY What changed: See the new instructions.   atropine 1 % ophthalmic solution Place 1 drop into both eyes 2 (two) times daily.   blood glucose meter kit and supplies Kit Dispense based on patient and insurance preference. Use up to four times daily as directed. (FOR ICD-10 E.11.9)   buPROPion 150 MG 12 hr tablet Commonly known as: ZYBAN Take 150 mg by  mouth daily.   calcium acetate 667 MG capsule Commonly known as: PHOSLO Take 2,001 mg by mouth 3 (three) times daily with meals.   diltiazem 2 % Gel Apply 1 application topically 5 (five) times daily.   dorzolamide-timolol 22.3-6.8 MG/ML ophthalmic solution Commonly known as: COSOPT Place 1 drop into both eyes 2 (two) times daily.   furosemide 40 MG tablet Commonly known as: LASIX Take 1 tablet (40 mg total) by mouth daily.   gabapentin 600 MG tablet Commonly known as: Neurontin Take 1 tablet (600 mg total) by mouth 2 (two) times daily.   glucose blood test strip Commonly known as: ONE TOUCH ULTRA TEST USE TO TEST 3 TIMES A DAY   hydrALAZINE 100 MG tablet Commonly known as: APRESOLINE Take 150 mg by mouth 2 (two) times daily.   Insulin Pen Needle 29G X 12MM Misc E11.9 Dx Code Use once at bedtime with each nightly insulin   lamoTRIgine 25 MG tablet Commonly known as: LAMICTAL Take by mouth.   latanoprost 0.005 % ophthalmic solution Commonly known as: XALATAN Place 1 drop into both eyes at bedtime.   onetouch ultrasoft lancets Use as instructed   pravastatin 80 MG tablet Commonly known as: PRAVACHOL Take 1 tablet (80 mg total) by mouth daily.   predniSONE 20 MG tablet Commonly known as: DELTASONE Take 2 tablets (40 mg total) by mouth daily with breakfast for 2 days.   timolol 0.5 % ophthalmic solution Commonly known as: TIMOPTIC Place 1 drop into the left eye at bedtime.   Travoprost (BAK Free) 0.004 % Soln ophthalmic solution Commonly known as: TRAVATAN Place 1 drop into both eyes at bedtime.               Durable Medical Equipment  (From admission, onward)           Start     Ordered   09/22/21 1053  For home use only DME standard manual wheelchair with seat cushion  Once       Comments: Patient suffers from neuropathy which impairs their ability to perform daily activities like bathing, dressing, feeding, grooming, and toileting in the  home.  A cane, crutch, or walker will not resolve issue with performing activities of daily living. A wheelchair will allow patient to safely perform daily activities. Patient can safely propel the wheelchair in the home or has a caregiver who can provide assistance. Length of need Lifetime. Accessories: elevating leg rests (ELRs), wheel locks, extensions and anti-tippers.   09/22/21 1052            Follow-up Information     [name], FNP. Schedule an appointment as soon as possible for a visit in 1 week(s).   Specialty: Family Medicine Contact information: [place] [address] [phone]                Allergies  Allergen Reactions   Acyclovir And Related Itching    Consultations: Nephrology   Procedures/Studies: DG Chest 2 View  Result Date: 09/20/2021 CLINICAL DATA:  Fever, diabetes, hypertension EXAM: CHEST - 2 VIEW  COMPARISON:  09/16/2021 FINDINGS: Enlargement of cardiac silhouette with pulmonary vascular congestion. Atherosclerotic calcification aorta. Lungs clear. No pulmonary infiltrate, pleural effusion, or pneumothorax. Osseous demineralization. IMPRESSION: Enlargement of cardiac silhouette. No acute abnormalities. Aortic Atherosclerosis (ICD10-I70.0). Electronically Signed   By: Lavonia Dana M.D.   On: 09/20/2021 15:38   DG Chest Port 1 View  Result Date: 09/16/2021 CLINICAL DATA:  Sepsis. EXAM: PORTABLE CHEST 1 VIEW COMPARISON:  December 10, 2020. FINDINGS: The heart size and mediastinal contours are within normal limits. Both lungs are clear. The visualized skeletal structures are unremarkable. IMPRESSION: No active disease. Aortic Atherosclerosis (ICD10-I70.0). Electronically Signed   By: Marijo Conception M.D.   On: 09/16/2021 13:24   DG Foot Complete Right  Result Date: 09/22/2021 CLINICAL DATA:  Injury with pain EXAM: RIGHT FOOT COMPLETE - 3+ VIEW COMPARISON:  None. FINDINGS: No evidence of regional fracture. Mild hallux  valgus deformity. Arterial calcification throughout the foot and ankle is visible. IMPRESSION: No acute or traumatic finding. Mild hallux valgus deformity. Arterial calcification. Electronically Signed   By: Nelson Chimes M.D.   On: 09/22/2021 18:53   ECHOCARDIOGRAM COMPLETE  Result Date: 09/18/2021    ECHOCARDIOGRAM REPORT   Patient Name:   Tammy Moses Date of Exam: 09/18/2021 Medical Rec #:  [id number]      Height:       61.0 in Accession #:    [id number]     Weight:       122.4 lb Date of Birth:  [birth year]      BSA:          1.533 m Patient Age:    29 years       BP:           160/66 mmHg Patient Gender: F              HR:           81 bpm. Exam Location:  Inpatient Procedure: 2D Echo, Cardiac Doppler and Color Doppler Indications:    CHF-Acute Systolic  History:        Patient has no prior history of Echocardiogram examinations.                 COPD; Risk Factors:Hypertension and Diabetes.  Sonographer:    Wenda Low Referring Phys: [id number] Albert  1. Left ventricular ejection fraction, by estimation, is 60 to 65%. The left ventricle has normal function. The left ventricle has no regional wall motion abnormalities. There is moderate left ventricular hypertrophy. Left ventricular diastolic parameters are indeterminate.  2. Right ventricular systolic function is normal. The right ventricular size is normal. There is mildly elevated pulmonary artery systolic pressure.  3. The mitral valve is degenerative. Trivial mitral valve regurgitation. No evidence of mitral stenosis. Moderate mitral annular calcification.  4. The aortic valve is tricuspid. There is moderate calcification of the aortic valve. There is moderate thickening of the aortic valve. Aortic valve regurgitation is not visualized. Mild aortic valve stenosis.  5. The inferior vena cava is normal in size with greater than 50% respiratory variability, suggesting right atrial pressure of 3 mmHg. FINDINGS  Left Ventricle: Left  ventricular ejection fraction, by estimation, is 60 to 65%. The left ventricle has normal function. The left ventricle has no regional wall motion abnormalities. The left ventricular internal cavity size was normal in size. There is  moderate left ventricular hypertrophy. Left ventricular diastolic parameters are indeterminate. Right Ventricle: The right ventricular size is normal. No  increase in right ventricular wall thickness. Right ventricular systolic function is normal. There is mildly elevated pulmonary artery systolic pressure. The tricuspid regurgitant velocity is 2.96  m/s, and with an assumed right atrial pressure of 8 mmHg, the estimated right ventricular systolic pressure is 45.6 mmHg. Left Atrium: Left atrial size was normal in size. Right Atrium: Right atrial size was normal in size. Pericardium: There is no evidence of pericardial effusion. Mitral Valve: The mitral valve is degenerative in appearance. There is mild thickening of the mitral valve leaflet(s). There is mild calcification of the mitral valve leaflet(s). Moderate mitral annular calcification. Trivial mitral valve regurgitation. No evidence of mitral valve stenosis. MV peak gradient, 10.8 mmHg. The mean mitral valve gradient is 4.0 mmHg. Tricuspid Valve: The tricuspid valve is normal in structure. Tricuspid valve regurgitation is mild . No evidence of tricuspid stenosis. Aortic Valve: The aortic valve is tricuspid. There is moderate calcification of the aortic valve. There is moderate thickening of the aortic valve. Aortic valve regurgitation is not visualized. Mild aortic stenosis is present. Aortic valve mean gradient measures 10.5 mmHg. Aortic valve peak gradient measures 20.2 mmHg. Aortic valve area, by VTI measures 1.65 cm. Pulmonic Valve: The pulmonic valve was normal in structure. Pulmonic valve regurgitation is not visualized. No evidence of pulmonic stenosis. Aorta: The aortic root is normal in size and structure. Venous: The  inferior vena cava is normal in size with greater than 50% respiratory variability, suggesting right atrial pressure of 3 mmHg. IAS/Shunts: No atrial level shunt detected by color flow Doppler.  LEFT VENTRICLE PLAX 2D LVIDd:         4.80 cm   Diastology LVIDs:         2.90 cm   LV e' medial:    4.68 cm/s LV PW:         1.30 cm   LV E/e' medial:  25.6 LV IVS:        1.50 cm   LV e' lateral:   6.42 cm/s LVOT diam:     1.90 cm   LV E/e' lateral: 18.7 LV SV:         63 LV SV Index:   41 LVOT Area:     2.84 cm  RIGHT VENTRICLE RV Basal diam:  3.15 cm RV Mid diam:    2.60 cm RV S prime:     11.70 cm/s TAPSE (M-mode): 2.0 cm LEFT ATRIUM           Index        RIGHT ATRIUM           Index LA diam:      3.60 cm 2.35 cm/m   RA Area:     15.60 cm LA Vol (A2C): 37.8 ml 24.66 ml/m  RA Volume:   40.00 ml  26.10 ml/m LA Vol (A4C): 54.2 ml 35.36 ml/m  AORTIC VALVE                     PULMONIC VALVE AV Area (Vmax):    1.65 cm      PV Vmax:       0.98 m/s AV Area (Vmean):   1.67 cm      PV Peak grad:  3.9 mmHg AV Area (VTI):     1.65 cm AV Vmax:           224.50 cm/s AV Vmean:          151.000 cm/s AV VTI:  0.380 m AV Peak Grad:      20.2 mmHg AV Mean Grad:      10.5 mmHg LVOT Vmax:         131.00 cm/s LVOT Vmean:        89.000 cm/s LVOT VTI:          0.221 m LVOT/AV VTI ratio: 0.58  AORTA Ao Root diam: 2.80 cm MITRAL VALVE                TRICUSPID VALVE MV Area (PHT): 2.95 cm     TR Peak grad:   35.0 mmHg MV Area VTI:   1.55 cm     TR Vmax:        296.00 cm/s MV Peak grad:  10.8 mmHg MV Mean grad:  4.0 mmHg     SHUNTS MV Vmax:       1.64 m/s     Systemic VTI:  0.22 m MV Vmean:      96.0 cm/s    Systemic Diam: 1.90 cm MV Decel Time: 257 msec MV E velocity: 120.00 cm/s MV A velocity: 134.00 cm/s MV E/A ratio:  0.90 Jenkins Rouge MD Electronically signed by Jenkins Rouge MD Signature Date/Time: 09/18/2021/3:06:24 PM    Final    US Abdomen Limited RUQ (LIVER/GB)  Result Date: 09/21/2021 CLINICAL DATA:  Fever,  nausea and vomiting EXAM: ULTRASOUND ABDOMEN LIMITED RIGHT UPPER QUADRANT COMPARISON:  None. FINDINGS: Gallbladder: No gallstones or wall thickening visualized. No sonographic Murphy sign noted by sonographer. Common bile duct: Diameter: 4.5 mm, normal Liver: No focal lesion identified. Within normal limits in parenchymal echogenicity. Portal vein is patent on color Doppler imaging with normal direction of blood flow towards the liver. Other: None. IMPRESSION: Unremarkable right upper quadrant ultrasound. Electronically Signed   By: Macy Mis M.D.   On: 09/21/2021 08:36   (Echo, Carotid, EGD, Colonoscopy, ERCP)    Subjective: No complaints  Discharge Exam: Vitals:   09/24/21 0451 09/24/21 0718  BP: (!) 173/76 (!) 154/57  Pulse: 79 73  Resp: 16 20  Temp: 97.7 F (36.5 C) 97.6 F (36.4 C)  SpO2: 100% 100%   Vitals:   09/23/21 1830 09/23/21 2058 09/24/21 0451 09/24/21 0718  BP: (!) 201/50 (!) 160/61 (!) 173/76 (!) 154/57  Pulse: 70 78 79 73  Resp:  _0 Temp:  98.9 F (37.2 C) 97.7 F (36.5 C) 97.6 F (36.4 C)  TempSrc:  Oral Oral   SpO2:  100% 100% 100%  Weight:      Height:        General: Pt is alert, awake, not in acute distress Cardiovascular: RRR, S1/S2 +, no rubs, no gallops Respiratory: CTA bilaterally, no wheezing, no rhonchi Abdominal: Soft, NT, ND, bowel sounds + Extremities: no edema, no cyanosis    The results of significant diagnostics from this hospitalization (including imaging, microbiology, ancillary and laboratory) are listed below for reference.     Microbiology: Recent Results (from the past 240 hour(s))  Blood Culture (routine x 2)     Status: None   Collection Time: 09/16/21 12:13 PM   Specimen: BLOOD RIGHT ARM  Result Value Ref Range Status   Specimen Description BLOOD RIGHT ARM  Final   Special Requests   Final    BOTTLES DRAWN AEROBIC AND ANAEROBIC Blood Culture adequate volume   Culture   Final    NO GROWTH 5 DAYS Performed at  [facility], [address] [place], [place]  [address]    Report Status 09/21/2021 FINAL  Final  Resp Panel by RT-PCR (Flu A&B, Covid) Nasopharyngeal Swab     Status: None   Collection Time: 09/16/21  1:17 PM   Specimen: Nasopharyngeal Swab; Nasopharyngeal(NP) swabs in vial transport medium  Result Value Ref Range Status   SARS Coronavirus 2 by RT PCR NEGATIVE NEGATIVE Final    Comment: (NOTE) SARS-CoV-2 target nucleic acids are NOT DETECTED.  The SARS-CoV-2 RNA is generally detectable in upper respiratory specimens during the acute phase of infection. The lowest concentration of SARS-CoV-2 viral copies this assay can detect is 138 copies/mL. A negative result does not preclude SARS-Cov-2 infection and should not be used as the sole basis for treatment or other patient management decisions. A negative result may occur with  improper specimen collection/handling, submission of specimen other than nasopharyngeal swab, presence of viral mutation(s) within the areas targeted by this assay, and inadequate number of viral copies(<138 copies/mL). A negative result must be combined with clinical observations, patient history, and epidemiological information. The expected result is Negative.  Fact Sheet for Patients:  EntrepreneurPulse.com.au  Fact Sheet for Healthcare Providers:  IncredibleEmployment.be  This test is no t yet approved or cleared by the [place] FDA and  has been authorized for detection and/or diagnosis of SARS-CoV-2 by FDA under an Emergency Use Authorization (EUA). This EUA will remain  in effect (meaning this test can be used) for the duration of the COVID-19 declaration under Section 564(b)(1) of the Act, 21 U.S.C.section 360bbb-3(b)(1), unless the authorization is terminated  or revoked sooner.       Influenza A by PCR NEGATIVE NEGATIVE Final   Influenza B by PCR NEGATIVE NEGATIVE Final    Comment: (NOTE) The  Xpert Xpress SARS-CoV-2/FLU/RSV plus assay is intended as an aid in the diagnosis of influenza from Nasopharyngeal swab specimens and should not be used as a sole basis for treatment. Nasal washings and aspirates are unacceptable for Xpert Xpress SARS-CoV-2/FLU/RSV testing.  Fact Sheet for Patients: EntrepreneurPulse.com.au  Fact Sheet for Healthcare Providers: IncredibleEmployment.be  This test is not yet approved or cleared by the [place] FDA and has been authorized for detection and/or diagnosis of SARS-CoV-2 by FDA under an Emergency Use Authorization (EUA). This EUA will remain in effect (meaning this test can be used) for the duration of the COVID-19 declaration under Section 564(b)(1) of the Act, 21 U.S.C. section 360bbb-3(b)(1), unless the authorization is terminated or revoked.  Performed at [facility], [place] [address], [place], [place] [address]   Respiratory (~20 pathogens) panel by PCR     Status: None   Collection Time: 09/16/21  1:17 PM   Specimen: Nasopharyngeal Swab; Respiratory  Result Value Ref Range Status   Adenovirus NOT DETECTED NOT DETECTED Final   Coronavirus 229E NOT DETECTED NOT DETECTED Final    Comment: (NOTE) The Coronavirus on the Respiratory Panel, DOES NOT test for the novel  Coronavirus (2019 nCoV)    Coronavirus HKU1 NOT DETECTED NOT DETECTED Final   Coronavirus NL63 NOT DETECTED NOT DETECTED Final   Coronavirus OC43 NOT DETECTED NOT DETECTED Final   Metapneumovirus NOT DETECTED NOT DETECTED Final   Rhinovirus / Enterovirus NOT DETECTED NOT DETECTED Final   Influenza A NOT DETECTED NOT DETECTED Final   Influenza B NOT DETECTED NOT DETECTED Final   Parainfluenza Virus 1 NOT DETECTED NOT DETECTED Final   Parainfluenza Virus 2 NOT DETECTED NOT DETECTED Final   Parainfluenza Virus 3 NOT DETECTED NOT DETECTED  Final   Parainfluenza Virus 4 NOT DETECTED NOT DETECTED Final   Respiratory Syncytial  Virus NOT DETECTED NOT DETECTED Final   Bordetella pertussis NOT DETECTED NOT DETECTED Final   Bordetella Parapertussis NOT DETECTED NOT DETECTED Final   Chlamydophila pneumoniae NOT DETECTED NOT DETECTED Final   Mycoplasma pneumoniae NOT DETECTED NOT DETECTED Final    Comment: Performed at [facility], [place] [address] [place], [place] [address]  Blood Culture (routine x 2)     Status: None   Collection Time: 09/16/21  8:29 PM   Specimen: BLOOD RIGHT HAND  Result Value Ref Range Status   Specimen Description BLOOD RIGHT HAND  Final   Special Requests   Final    BOTTLES DRAWN AEROBIC AND ANAEROBIC Blood Culture adequate volume   Culture   Final    NO GROWTH 5 DAYS Performed at [facility], [place] [address] [place], [place] [address]    Report Status 09/21/2021 FINAL  Final  Culture, blood (routine x 2)     Status: None (Preliminary result)   Collection Time: 09/20/21 12:21 PM   Specimen: BLOOD  Result Value Ref Range Status   Specimen Description BLOOD RIGHT ANTECUBITAL  Final   Special Requests   Final    BOTTLES DRAWN AEROBIC ONLY Blood Culture adequate volume   Culture   Final    NO GROWTH 4 DAYS Performed at [facility], [place] [address], [place], [place] [address]    Report Status PENDING  Incomplete  Culture, blood (routine x 2)     Status: None (Preliminary result)   Collection Time: 09/20/21 12:24 PM   Specimen: BLOOD RIGHT HAND  Result Value Ref Range Status   Specimen Description BLOOD RIGHT HAND  Final   Special Requests   Final    BOTTLES DRAWN AEROBIC ONLY Blood Culture adequate volume   Culture   Final    NO GROWTH 4 DAYS Performed at [facility], [place] [address], [place], [place] [address]    Report Status PENDING  Incomplete     Labs: BNP (last 3 results) Recent Labs    09/16/21 1405  BNP 3,500.9*   Basic Metabolic Panel: Recent Labs  Lab 09/18/21 1538 09/20/21 0044 09/21/21 0248 09/23/21 0229  NA 133* 132* 124* 127*  K  3.4* 3.9 3.9 4.1  CL 97* 94* 89* 94*  CO2 23 23 19* 22  GLUCOSE 157* 147* 110* 111*  BUN 33* 68* 72* 47*  CREATININE 7.63* 10.09* 10.56* 9.24*  CALCIUM 8.6* 8.2* 8.8* 8.9  MG  --  2.0  --   --   PHOS 4.2  --   --   --    Liver Function Tests: Recent Labs  Lab 09/18/21 1538 09/20/21 1221 09/21/21 0248  AST  --  14* 23  ALT  --  10 12  ALKPHOS  --  45 44  BILITOT  --  0.5 0.4  PROT  --  6.6 6.7  ALBUMIN 2.4* 2.5* 2.5*   No results for input(s): LIPASE, AMYLASE in the last 168 hours. No results for input(s): AMMONIA in the last 168 hours. CBC: Recent Labs  Lab 09/18/21 1538 09/20/21 0044 09/21/21 0248 09/23/21 0229 09/24/21 0305  WBC 9.7 8.6 9.0 11.7* 15.7*  NEUTROABS  --   --   --   --  12.4*  HGB 10.7* 9.4* 9.4* 10.1* 10.0*  HCT 31.9* 29.1* 28.7* 31.3* 31.4*  MCV 86.7 89.3 88.3 90.5  90.0  PLT 316 339 361 379 415*   Cardiac Enzymes: No results for input(s): CKTOTAL, CKMB, CKMBINDEX, TROPONINI in the last 168 hours. BNP: Invalid input(s): POCBNP CBG: Recent Labs  Lab 09/23/21 0747 09/23/21 1122 09/23/21 1814 09/23/21 2100 09/24/21 0721  GLUCAP 130* 106* 259* 250* 119*   D-Dimer No results for input(s): DDIMER in the last 72 hours. Hgb A1c No results for input(s): HGBA1C in the last 72 hours. Lipid Profile No results for input(s): CHOL, HDL, LDLCALC, TRIG, CHOLHDL, LDLDIRECT in the last 72 hours. Thyroid function studies No results for input(s): TSH, T4TOTAL, T3FREE, THYROIDAB in the last 72 hours.  Invalid input(s): FREET3 Anemia work up No results for input(s): VITAMINB12, FOLATE, FERRITIN, TIBC, IRON, RETICCTPCT in the last 72 hours. Urinalysis    Component Value Date/Time   COLORURINE YELLOW 09/16/2021 1511   APPEARANCEUR HAZY (A) 09/16/2021 1511   LABSPEC 1.013 09/16/2021 1511   PHURINE 5.0 09/16/2021 1511   GLUCOSEU >=500 (A) 09/16/2021 1511   HGBUR SMALL (A) 09/16/2021 1511   BILIRUBINUR NEGATIVE 09/16/2021 1511   BILIRUBINUR Negative  12/11/2019 0809   KETONESUR 5 (A) 09/16/2021 1511   PROTEINUR 100 (A) 09/16/2021 1511   UROBILINOGEN 0.2 12/11/2019 0809   UROBILINOGEN 0.2 12/13/2017 1353   NITRITE NEGATIVE 09/16/2021 1511   LEUKOCYTESUR NEGATIVE 09/16/2021 1511   Sepsis Labs Invalid input(s): PROCALCITONIN,  WBC,  LACTICIDVEN Microbiology Recent Results (from the past 240 hour(s))  Blood Culture (routine x 2)     Status: None   Collection Time: 09/16/21 12:13 PM   Specimen: BLOOD RIGHT ARM  Result Value Ref Range Status   Specimen Description BLOOD RIGHT ARM  Final   Special Requests   Final    BOTTLES DRAWN AEROBIC AND ANAEROBIC Blood Culture adequate volume   Culture   Final    NO GROWTH 5 DAYS Performed at [facility], [place] [address] [place], [place] [address]    Report Status 09/21/2021 FINAL  Final  Resp Panel by RT-PCR (Flu A&B, Covid) Nasopharyngeal Swab     Status: None   Collection Time: 09/16/21  1:17 PM   Specimen: Nasopharyngeal Swab; Nasopharyngeal(NP) swabs in vial transport medium  Result Value Ref Range Status   SARS Coronavirus 2 by RT PCR NEGATIVE NEGATIVE Final    Comment: (NOTE) SARS-CoV-2 target nucleic acids are NOT DETECTED.  The SARS-CoV-2 RNA is generally detectable in upper respiratory specimens during the acute phase of infection. The lowest concentration of SARS-CoV-2 viral copies this assay can detect is 138 copies/mL. A negative result does not preclude SARS-Cov-2 infection and should not be used as the sole basis for treatment or other patient management decisions. A negative result may occur with  improper specimen collection/handling, submission of specimen other than nasopharyngeal swab, presence of viral mutation(s) within the areas targeted by this assay, and inadequate number of viral copies(<138 copies/mL). A negative result must be combined with clinical observations, patient history, and epidemiological information. The expected result is Negative.  Fact  Sheet for Patients:  EntrepreneurPulse.com.au  Fact Sheet for Healthcare Providers:  IncredibleEmployment.be  This test is no t yet approved or cleared by the [place] FDA and  has been authorized for detection and/or diagnosis of SARS-CoV-2 by FDA under an Emergency Use Authorization (EUA). This EUA will remain  in effect (meaning this test can be used) for the duration of the COVID-19 declaration under Section 564(b)(1) of the Act, 21 U.S.C.section 360bbb-3(b)(1), unless the authorization is terminated  or  revoked sooner.       Influenza A by PCR NEGATIVE NEGATIVE Final   Influenza B by PCR NEGATIVE NEGATIVE Final    Comment: (NOTE) The Xpert Xpress SARS-CoV-2/FLU/RSV plus assay is intended as an aid in the diagnosis of influenza from Nasopharyngeal swab specimens and should not be used as a sole basis for treatment. Nasal washings and aspirates are unacceptable for Xpert Xpress SARS-CoV-2/FLU/RSV testing.  Fact Sheet for Patients: EntrepreneurPulse.com.au  Fact Sheet for Healthcare Providers: IncredibleEmployment.be  This test is not yet approved or cleared by the [place] FDA and has been authorized for detection and/or diagnosis of SARS-CoV-2 by FDA under an Emergency Use Authorization (EUA). This EUA will remain in effect (meaning this test can be used) for the duration of the COVID-19 declaration under Section 564(b)(1) of the Act, 21 U.S.C. section 360bbb-3(b)(1), unless the authorization is terminated or revoked.  Performed at [facility], [place] [address] [place], [place] [address]   Respiratory (~20 pathogens) panel by PCR     Status: None   Collection Time: 09/16/21  1:17 PM   Specimen: Nasopharyngeal Swab; Respiratory  Result Value Ref Range Status   Adenovirus NOT DETECTED NOT DETECTED Final   Coronavirus 229E NOT DETECTED NOT DETECTED Final    Comment: (NOTE) The  Coronavirus on the Respiratory Panel, DOES NOT test for the novel  Coronavirus (2019 nCoV)    Coronavirus HKU1 NOT DETECTED NOT DETECTED Final   Coronavirus NL63 NOT DETECTED NOT DETECTED Final   Coronavirus OC43 NOT DETECTED NOT DETECTED Final   Metapneumovirus NOT DETECTED NOT DETECTED Final   Rhinovirus / Enterovirus NOT DETECTED NOT DETECTED Final   Influenza A NOT DETECTED NOT DETECTED Final   Influenza B NOT DETECTED NOT DETECTED Final   Parainfluenza Virus 1 NOT DETECTED NOT DETECTED Final   Parainfluenza Virus 2 NOT DETECTED NOT DETECTED Final   Parainfluenza Virus 3 NOT DETECTED NOT DETECTED Final   Parainfluenza Virus 4 NOT DETECTED NOT DETECTED Final   Respiratory Syncytial Virus NOT DETECTED NOT DETECTED Final   Bordetella pertussis NOT DETECTED NOT DETECTED Final   Bordetella Parapertussis NOT DETECTED NOT DETECTED Final   Chlamydophila pneumoniae NOT DETECTED NOT DETECTED Final   Mycoplasma pneumoniae NOT DETECTED NOT DETECTED Final    Comment: Performed at [facility], [place]. [address], [place], [place] [address]  Blood Culture (routine x 2)     Status: None   Collection Time: 09/16/21  8:29 PM   Specimen: BLOOD RIGHT HAND  Result Value Ref Range Status   Specimen Description BLOOD RIGHT HAND  Final   Special Requests   Final    BOTTLES DRAWN AEROBIC AND ANAEROBIC Blood Culture adequate volume   Culture   Final    NO GROWTH 5 DAYS Performed at [facility], [place] [address] [place], [place] [address]    Report Status 09/21/2021 FINAL  Final  Culture, blood (routine x 2)     Status: None (Preliminary result)   Collection Time: 09/20/21 12:21 PM   Specimen: BLOOD  Result Value Ref Range Status   Specimen Description BLOOD RIGHT ANTECUBITAL  Final   Special Requests   Final    BOTTLES DRAWN AEROBIC ONLY Blood Culture adequate volume   Culture   Final    NO GROWTH 4 DAYS Performed at [facility], [place] [address], [place], [place] [address]     Report Status PENDING  Incomplete  Culture, blood (routine x 2)  Status: None (Preliminary result)   Collection Time: 09/20/21 12:24 PM   Specimen: BLOOD RIGHT HAND  Result Value Ref Range Status   Specimen Description BLOOD RIGHT HAND  Final   Special Requests   Final    BOTTLES DRAWN AEROBIC ONLY Blood Culture adequate volume   Culture   Final    NO GROWTH 4 DAYS Performed at [facility], [address], [place], [place] [address]    Report Status PENDING  Incomplete     SIGNED:   Charlynne Cousins, MD  [facility] 09/24/2021, 8:54 AM Pager   If 7PM-7AM, please contact night-coverage [email or web address] Password [email or web address]

## 2021-09-24 NOTE — TOC Transition Note (Signed)
Transition of Care [facility]) - CM/SW Discharge Note   Patient Details  Name: Tammy Moses MRN: [id number] Date of Birth: 08-18-1966  Transition of Care [facility]) CM/SW Contact:  Sharin Mons, RN Phone Number: 09/24/2021, 10:34 AM   Clinical Narrative:    Patient will DC to: home Anticipated DC date: 09/24/2021 Family notified: mom, [phone], [name] Transport by: Ambulance  Per MD patient ready for DC today. Pt now states refusal of SNF placement and is going home. RN, patient, patient's Mom, [place] and [place] made aware of DC.[facility], MWF, chair time 11:25 am. [employer] [phone]) made aware of transportation needs for 09/25/2021. Mom states will get in touch with pt's niece Tammy Moses for pt's transportation to home, unsure of niece's availability.Marland KitchenMarland KitchenMarland KitchenMarland Kitchenawaiting call back.  [facility] will deliver Rx med to bedside prior to d/c.  DME :  Referral for rolling walker made with [facility].  W/C @ bedside ( [facility] will pick up and delivery to pt's home along with RW. Equipment can't accompany pt in ambulance.  Post hospital f/u noted on AVS.  11:43 am NCM f/u with pt's mom regarding transportation to home. Mom stated niece not available, no one to pick pt.  11:54 am [facility] transport requested for patient ( would be an out  of pocket expense for pt), pt unable  to afford. [employer] [phone] ( NO AVAILABILITY) , [employer] [phone]  ( DON'T [place]), [facility] [phone] ( don't accept insurance).   12:31 pm [name], [phone] called pt . NCM spoke with [place] stated she will pick pt up for transportation to home, nurse made aware.  RNCM will sign off for now as intervention is no longer needed. Please consult [place] again if new needs arise.   RNCM will sign off for now as intervention is no longer needed.  Please consult [place] again if new needs arise.    Final next level of care: [place] Barriers to Discharge: No Barriers Identified   Patient Goals and CMS Choice Patient states their goals for this hospitalization and ongoing recovery are:: "go home" CMS Medicare.gov Compare Post Acute Care list provided to:: Patient Choice offered to / list presented to : Patient  Discharge Placement                       Discharge Plan and Services In-house Referral: Clinical Social Work   Post Acute Care Choice: [place]          DME Arranged: Youth worker wheelchair with seat cushion DME Agency: [facility] Date DME Agency Contacted: 09/22/21 Time DME Agency Contacted: [id number] Representative spoke with at DME Agency: [place]: OT, PT, Social Work [employer] Agency: [employer] ([place]) Date [place]: 09/22/21 Time [place]: [place] Representative spoke with at [place]: [place] ([place]) Interventions     Readmission Risk Interventions No flowsheet data found.

## 2021-09-24 NOTE — Progress Notes (Signed)
Physical Therapy Treatment Patient Details Name: Tammy Moses MRN: [id number] DOB: Feb 04, 1966 Today's Date: 09/24/2021   History of Present Illness 55 y/o presented to ED on 11/23 for 1 week history of SOB and cough with nausea/vomiting. Also has missed HD for unknown time, told nephrology 11/16. Found to have hypoxia. PMH: diabetes, ESRD on HD, diabetic neuropathy, diabetic retinopathy, HTN, tobacco dependence, blind in R eye, COPD/asthma    PT Comments    Continuing work on functional mobility and activity tolerance;  Session focused on ambulation, and in particular, describing the function of the right AFO for smoother gait pattern, dorsiflexion assist, and R knee control; Had done some behind-the-scenes work Financial trader, discussing gait and skni concerns with Orthotist to get best options; Encouraged pt to stay until AFO is delivered -- noted she will be going home today   Recommendations for follow up therapy are one component of a multi-disciplinary discharge planning process, led by the attending physician.  Recommendations may be updated based on patient status, additional functional criteria and insurance authorization.  Follow Up Recommendations  Skilled nursing-short term rehab (<3 hours/day) (Pt choosing to go home)     Assistance Recommended at Discharge Intermittent Supervision/Assistance  Equipment Recommendations  Rolling walker (2 wheels) (delivered to her room)    Recommendations for Other Services Other (comment) (Orthotist consult in today)     Precautions / Restrictions Precautions Precautions: Fall;Other (comment) Precaution Comments: blind, Rt foot drop, LEs buckling     Mobility  Bed Mobility                    Transfers                        Ambulation/Gait Ambulation/Gait assistance: Min assist;Min guard Gait Distance (Feet): 40 Feet Assistive device: Rolling walker (2 wheels) Gait Pattern/deviations: Decreased step  length - right;Decreased step length - left;Decreased stride length;Decreased dorsiflexion - right;Trunk flexed Gait velocity: decreased     General Gait Details: Pt met ths PT in hallway, and ambualted back to room with PT; Dressed and in shoes, R foot drop still quite prevalent; occasional minguard to steady due to loss of balance   Stairs             Wheelchair Mobility    Modified Rankin (Stroke Patients Only)       Balance     Sitting balance-Leahy Scale: Fair       Standing balance-Leahy Scale: Poor                              Cognition Arousal/Alertness: Awake/alert Behavior During Therapy: WFL for tasks assessed/performed Overall Cognitive Status: No family/caregiver present to determine baseline cognitive functioning                                 General Comments: A&Ox4. Slow processing and decreased awareness of deficits.        Exercises      General Comments General comments (skin integrity, edema, etc.): Discussion today focused on getting the right AFO for her for optimal R foot clearance and better knee control to decr risk of falls; Encouraged pt to wait for Orthotist to deliver her AFO      Pertinent Vitals/Pain Pain Assessment: Faces Faces Pain Scale: Hurts little more Pain Location: R foot Pain Descriptors /  Indicators: Jabbing;Sharp Pain Intervention(s): Monitored during session    Home Living                          Prior Function            PT Goals (current goals can now be found in the care plan section) Acute Rehab PT Goals Patient Stated Goal: to get this R leg better; be able to go home PT Goal Formulation: With patient Time For Goal Achievement: 10/03/21 Potential to Achieve Goals: Fair Progress towards PT goals: Progressing toward goals    Frequency    Min 3X/week      PT Plan Current plan remains appropriate;Other (comment) (However per pt choice, plan has changed to  going home with [facility] services)    Co-evaluation              AM-PAC PT "6 Clicks" Mobility   Outcome Measure  Help needed turning from your back to your side while in a flat bed without using bedrails?: None Help needed moving from lying on your back to sitting on the side of a flat bed without using bedrails?: A Little Help needed moving to and from a bed to a chair (including a wheelchair)?: A Little Help needed standing up from a chair using your arms (e.g., wheelchair or bedside chair)?: A Little Help needed to walk in hospital room?: A Little Help needed climbing 3-5 steps with a railing? : Total 6 Click Score: 17    End of Session         PT Visit Diagnosis: Unsteadiness on feet (R26.81);Muscle weakness (generalized) (M62.81);Repeated falls (R29.6);History of falling (Z91.81);Other abnormalities of gait and mobility (R26.89);Difficulty in walking, not elsewhere classified (R26.2)     Time: 4514-6047 PT Time Calculation (min) (ACUTE ONLY): 9 min  Charges:  $Gait Training: 8-22 mins                     Tammy Moses, [place]  Acute Rehabilitation Services Pager [phone] Office [phone]    Tammy Moses 09/24/2021, 2:33 PM

## 2021-09-25 LAB — CULTURE, BLOOD (ROUTINE X 2)
Culture: NO GROWTH
Culture: NO GROWTH
Special Requests: ADEQUATE
Special Requests: ADEQUATE

## 2021-09-29 ENCOUNTER — Other Ambulatory Visit: Payer: Self-pay | Admitting: *Deleted

## 2021-09-29 NOTE — Patient Outreach (Signed)
[facility]) Care Management  09/29/2021  MADHURI VACCA 04-25-1966 [id number]  Referral Received 12/2 Transition of Care Initial Outreach Unsuccessful  RN attempted outreach call all available contacts today however unsuccessful and unable to leave a HIPAA approved voice message.  Will attempt another outreach call over the next week for pending [facility] services.  Raina Mina, RN Care Management Coordinator [place] Office [phone]

## 2021-09-30 ENCOUNTER — Inpatient Hospital Stay (HOSPITAL_COMMUNITY)
Admission: EM | Admit: 2021-09-30 | Discharge: 2021-10-09 | DRG: 252 | Disposition: A | Discharge status: Skilled Nursing Facility | Payer: Medicare Other | Attending: Internal Medicine | Admitting: Internal Medicine

## 2021-09-30 ENCOUNTER — Emergency Department (HOSPITAL_COMMUNITY): Payer: Medicare Other

## 2021-09-30 ENCOUNTER — Other Ambulatory Visit: Payer: Self-pay

## 2021-09-30 ENCOUNTER — Encounter (HOSPITAL_COMMUNITY): Payer: Self-pay | Admitting: Emergency Medicine

## 2021-09-30 DIAGNOSIS — K5909 Other constipation: Secondary | ICD-10-CM | POA: Diagnosis present

## 2021-09-30 DIAGNOSIS — I161 Hypertensive emergency: Secondary | ICD-10-CM

## 2021-09-30 DIAGNOSIS — Z20822 Contact with and (suspected) exposure to covid-19: Secondary | ICD-10-CM | POA: Diagnosis present

## 2021-09-30 DIAGNOSIS — I132 Hypertensive heart and chronic kidney disease with heart failure and with stage 5 chronic kidney disease, or end stage renal disease: Principal | ICD-10-CM | POA: Diagnosis present

## 2021-09-30 DIAGNOSIS — Z79899 Other long term (current) drug therapy: Secondary | ICD-10-CM

## 2021-09-30 DIAGNOSIS — I10 Essential (primary) hypertension: Secondary | ICD-10-CM

## 2021-09-30 DIAGNOSIS — J810 Acute pulmonary edema: Secondary | ICD-10-CM

## 2021-09-30 DIAGNOSIS — Z7251 High risk heterosexual behavior: Secondary | ICD-10-CM

## 2021-09-30 DIAGNOSIS — Z794 Long term (current) use of insulin: Secondary | ICD-10-CM

## 2021-09-30 DIAGNOSIS — Z90711 Acquired absence of uterus with remaining cervical stump: Secondary | ICD-10-CM

## 2021-09-30 DIAGNOSIS — Z90710 Acquired absence of both cervix and uterus: Secondary | ICD-10-CM

## 2021-09-30 DIAGNOSIS — N186 End stage renal disease: Secondary | ICD-10-CM | POA: Diagnosis present

## 2021-09-30 DIAGNOSIS — E1140 Type 2 diabetes mellitus with diabetic neuropathy, unspecified: Secondary | ICD-10-CM | POA: Diagnosis present

## 2021-09-30 DIAGNOSIS — Z9114 Patient's other noncompliance with medication regimen: Secondary | ICD-10-CM

## 2021-09-30 DIAGNOSIS — J449 Chronic obstructive pulmonary disease, unspecified: Secondary | ICD-10-CM | POA: Diagnosis present

## 2021-09-30 DIAGNOSIS — T82858A Stenosis of vascular prosthetic devices, implants and grafts, initial encounter: Secondary | ICD-10-CM | POA: Diagnosis not present

## 2021-09-30 DIAGNOSIS — I700 Atherosclerosis of aorta: Secondary | ICD-10-CM | POA: Diagnosis present

## 2021-09-30 DIAGNOSIS — Z91199 Patient's noncompliance with other medical treatment and regimen due to unspecified reason: Secondary | ICD-10-CM | POA: Diagnosis not present

## 2021-09-30 DIAGNOSIS — Z9115 Patient's noncompliance with renal dialysis: Secondary | ICD-10-CM

## 2021-09-30 DIAGNOSIS — R0602 Shortness of breath: Secondary | ICD-10-CM

## 2021-09-30 DIAGNOSIS — H548 Legal blindness, as defined in USA: Secondary | ICD-10-CM | POA: Diagnosis present

## 2021-09-30 DIAGNOSIS — E875 Hyperkalemia: Secondary | ICD-10-CM | POA: Diagnosis present

## 2021-09-30 DIAGNOSIS — I5043 Acute on chronic combined systolic (congestive) and diastolic (congestive) heart failure: Secondary | ICD-10-CM | POA: Diagnosis present

## 2021-09-30 DIAGNOSIS — Y712 Prosthetic and other implants, materials and accessory cardiovascular devices associated with adverse incidents: Secondary | ICD-10-CM | POA: Diagnosis not present

## 2021-09-30 DIAGNOSIS — J9601 Acute respiratory failure with hypoxia: Secondary | ICD-10-CM | POA: Diagnosis present

## 2021-09-30 DIAGNOSIS — R0603 Acute respiratory distress: Secondary | ICD-10-CM

## 2021-09-30 DIAGNOSIS — G4700 Insomnia, unspecified: Secondary | ICD-10-CM | POA: Diagnosis present

## 2021-09-30 DIAGNOSIS — H409 Unspecified glaucoma: Secondary | ICD-10-CM | POA: Diagnosis present

## 2021-09-30 DIAGNOSIS — Z881 Allergy status to other antibiotic agents status: Secondary | ICD-10-CM

## 2021-09-30 DIAGNOSIS — R079 Chest pain, unspecified: Secondary | ICD-10-CM

## 2021-09-30 DIAGNOSIS — E7800 Pure hypercholesterolemia, unspecified: Secondary | ICD-10-CM | POA: Diagnosis present

## 2021-09-30 DIAGNOSIS — E871 Hypo-osmolality and hyponatremia: Secondary | ICD-10-CM | POA: Diagnosis present

## 2021-09-30 DIAGNOSIS — F419 Anxiety disorder, unspecified: Secondary | ICD-10-CM | POA: Diagnosis present

## 2021-09-30 DIAGNOSIS — H547 Unspecified visual loss: Secondary | ICD-10-CM | POA: Diagnosis present

## 2021-09-30 DIAGNOSIS — B182 Chronic viral hepatitis C: Secondary | ICD-10-CM | POA: Diagnosis present

## 2021-09-30 DIAGNOSIS — E1122 Type 2 diabetes mellitus with diabetic chronic kidney disease: Secondary | ICD-10-CM | POA: Diagnosis present

## 2021-09-30 DIAGNOSIS — N2581 Secondary hyperparathyroidism of renal origin: Secondary | ICD-10-CM | POA: Diagnosis present

## 2021-09-30 DIAGNOSIS — H5440 Blindness, one eye, unspecified eye: Secondary | ICD-10-CM | POA: Diagnosis present

## 2021-09-30 DIAGNOSIS — Z992 Dependence on renal dialysis: Secondary | ICD-10-CM

## 2021-09-30 DIAGNOSIS — E11319 Type 2 diabetes mellitus with unspecified diabetic retinopathy without macular edema: Secondary | ICD-10-CM | POA: Diagnosis present

## 2021-09-30 DIAGNOSIS — F17210 Nicotine dependence, cigarettes, uncomplicated: Secondary | ICD-10-CM | POA: Diagnosis present

## 2021-09-30 DIAGNOSIS — D631 Anemia in chronic kidney disease: Secondary | ICD-10-CM | POA: Diagnosis present

## 2021-09-30 DIAGNOSIS — D72828 Other elevated white blood cell count: Secondary | ICD-10-CM | POA: Diagnosis present

## 2021-09-30 DIAGNOSIS — K219 Gastro-esophageal reflux disease without esophagitis: Secondary | ICD-10-CM | POA: Diagnosis present

## 2021-09-30 LAB — CBC WITH DIFFERENTIAL/PLATELET
Abs Immature Granulocytes: 0.12 10*3/uL — ABNORMAL HIGH (ref 0.00–0.07)
Basophils Absolute: 0.1 10*3/uL (ref 0.0–0.1)
Basophils Relative: 1 %
Eosinophils Absolute: 0.3 10*3/uL (ref 0.0–0.5)
Eosinophils Relative: 2 %
HCT: 34.2 % — ABNORMAL LOW (ref 36.0–46.0)
Hemoglobin: 10.7 g/dL — ABNORMAL LOW (ref 12.0–15.0)
Immature Granulocytes: 1 %
Lymphocytes Relative: 9 %
Lymphs Abs: 1.9 10*3/uL (ref 0.7–4.0)
MCH: 28.8 pg (ref 26.0–34.0)
MCHC: 31.3 g/dL (ref 30.0–36.0)
MCV: 92.2 fL (ref 80.0–100.0)
Monocytes Absolute: 2 10*3/uL — ABNORMAL HIGH (ref 0.1–1.0)
Monocytes Relative: 9 %
Neutro Abs: 16.9 10*3/uL — ABNORMAL HIGH (ref 1.7–7.7)
Neutrophils Relative %: 78 %
Platelets: 518 10*3/uL — ABNORMAL HIGH (ref 150–400)
RBC: 3.71 MIL/uL — ABNORMAL LOW (ref 3.87–5.11)
RDW: 16.9 % — ABNORMAL HIGH (ref 11.5–15.5)
WBC: 21.3 10*3/uL — ABNORMAL HIGH (ref 4.0–10.5)
nRBC: 0 % (ref 0.0–0.2)

## 2021-09-30 LAB — I-STAT CHEM 8, ED
BUN: 84 mg/dL — ABNORMAL HIGH (ref 6–20)
Calcium, Ion: 0.96 mmol/L — ABNORMAL LOW (ref 1.15–1.40)
Chloride: 107 mmol/L (ref 98–111)
Creatinine, Ser: 15.4 mg/dL — ABNORMAL HIGH (ref 0.44–1.00)
Glucose, Bld: 136 mg/dL — ABNORMAL HIGH (ref 70–99)
HCT: 36 % (ref 36.0–46.0)
Hemoglobin: 12.2 g/dL (ref 12.0–15.0)
Potassium: 6.6 mmol/L (ref 3.5–5.1)
Sodium: 133 mmol/L — ABNORMAL LOW (ref 135–145)
TCO2: 19 mmol/L — ABNORMAL LOW (ref 22–32)

## 2021-09-30 LAB — COMPREHENSIVE METABOLIC PANEL
ALT: 16 U/L (ref 0–44)
ALT: 18 U/L (ref 0–44)
AST: 19 U/L (ref 15–41)
AST: 23 U/L (ref 15–41)
Albumin: 3 g/dL — ABNORMAL LOW (ref 3.5–5.0)
Albumin: 3.2 g/dL — ABNORMAL LOW (ref 3.5–5.0)
Alkaline Phosphatase: 64 U/L (ref 38–126)
Alkaline Phosphatase: 66 U/L (ref 38–126)
Anion gap: 16 — ABNORMAL HIGH (ref 5–15)
Anion gap: 18 — ABNORMAL HIGH (ref 5–15)
BUN: 83 mg/dL — ABNORMAL HIGH (ref 6–20)
BUN: 83 mg/dL — ABNORMAL HIGH (ref 6–20)
CO2: 17 mmol/L — ABNORMAL LOW (ref 22–32)
CO2: 17 mmol/L — ABNORMAL LOW (ref 22–32)
Calcium: 8.9 mg/dL (ref 8.9–10.3)
Calcium: 9 mg/dL (ref 8.9–10.3)
Chloride: 100 mmol/L (ref 98–111)
Chloride: 99 mmol/L (ref 98–111)
Creatinine, Ser: 13.7 mg/dL — ABNORMAL HIGH (ref 0.44–1.00)
Creatinine, Ser: 13.61 mg/dL — ABNORMAL HIGH (ref 0.44–1.00)
GFR, Estimated: 3 mL/min — ABNORMAL LOW (ref >60)
GFR, Estimated: 3 mL/min — ABNORMAL LOW (ref >60)
Glucose, Bld: 163 mg/dL — ABNORMAL HIGH (ref 70–99)
Glucose, Bld: 177 mg/dL — ABNORMAL HIGH (ref 70–99)
Potassium: 6.1 mmol/L — ABNORMAL HIGH (ref 3.5–5.1)
Potassium: 5.8 mmol/L — ABNORMAL HIGH (ref 3.5–5.1)
Sodium: 133 mmol/L — ABNORMAL LOW (ref 135–145)
Sodium: 134 mmol/L — ABNORMAL LOW (ref 135–145)
Total Bilirubin: 0.6 mg/dL (ref 0.3–1.2)
Total Bilirubin: 0.5 mg/dL (ref 0.3–1.2)
Total Protein: 7.6 g/dL (ref 6.5–8.1)
Total Protein: 7.9 g/dL (ref 6.5–8.1)

## 2021-09-30 LAB — BASIC METABOLIC PANEL
Anion gap: 15 (ref 5–15)
BUN: 40 mg/dL — ABNORMAL HIGH (ref 6–20)
CO2: 22 mmol/L (ref 22–32)
Calcium: 8.6 mg/dL — ABNORMAL LOW (ref 8.9–10.3)
Chloride: 97 mmol/L — ABNORMAL LOW (ref 98–111)
Creatinine, Ser: 8.42 mg/dL — ABNORMAL HIGH (ref 0.44–1.00)
GFR, Estimated: 5 mL/min — ABNORMAL LOW (ref >60)
Glucose, Bld: 317 mg/dL — ABNORMAL HIGH (ref 70–99)
Potassium: 4.5 mmol/L (ref 3.5–5.1)
Sodium: 134 mmol/L — ABNORMAL LOW (ref 135–145)

## 2021-09-30 LAB — I-STAT VENOUS BLOOD GAS, ED
Acid-base deficit: 5 mmol/L — ABNORMAL HIGH (ref 0.0–2.0)
Bicarbonate: 19 mmol/L — ABNORMAL LOW (ref 20.0–28.0)
Calcium, Ion: 1.01 mmol/L — ABNORMAL LOW (ref 1.15–1.40)
HCT: 35 % — ABNORMAL LOW (ref 36.0–46.0)
Hemoglobin: 11.9 g/dL — ABNORMAL LOW (ref 12.0–15.0)
O2 Saturation: 79 %
Potassium: 6.6 mmol/L (ref 3.5–5.1)
Sodium: 133 mmol/L — ABNORMAL LOW (ref 135–145)
TCO2: 20 mmol/L — ABNORMAL LOW (ref 22–32)
pCO2, Ven: 31.6 mmHg — ABNORMAL LOW (ref 44.0–60.0)
pH, Ven: 7.387 (ref 7.250–7.430)
pO2, Ven: 43 mmHg (ref 32.0–45.0)

## 2021-09-30 LAB — CBC
HCT: 30.2 % — ABNORMAL LOW (ref 36.0–46.0)
Hemoglobin: 9.7 g/dL — ABNORMAL LOW (ref 12.0–15.0)
MCH: 29.2 pg (ref 26.0–34.0)
MCHC: 32.1 g/dL (ref 30.0–36.0)
MCV: 91 fL (ref 80.0–100.0)
Platelets: 390 10*3/uL (ref 150–400)
RBC: 3.32 MIL/uL — ABNORMAL LOW (ref 3.87–5.11)
RDW: 16.8 % — ABNORMAL HIGH (ref 11.5–15.5)
WBC: 25.9 10*3/uL — ABNORMAL HIGH (ref 4.0–10.5)
nRBC: 0 % (ref 0.0–0.2)

## 2021-09-30 LAB — PROCALCITONIN: Procalcitonin: 0.68 ng/mL

## 2021-09-30 LAB — RESP PANEL BY RT-PCR (FLU A&B, COVID) ARPGX2
Influenza A by PCR: NEGATIVE
Influenza B by PCR: NEGATIVE
SARS Coronavirus 2 by RT PCR: NEGATIVE

## 2021-09-30 LAB — HEMOGLOBIN A1C
Hgb A1c MFr Bld: 5.9 % — ABNORMAL HIGH (ref 4.8–5.6)
Mean Plasma Glucose: 122.63 mg/dL

## 2021-09-30 LAB — D-DIMER, QUANTITATIVE: D-Dimer, Quant: 1.76 ug/mL-FEU — ABNORMAL HIGH (ref 0.00–0.50)

## 2021-09-30 LAB — BRAIN NATRIURETIC PEPTIDE
B Natriuretic Peptide: 3585.7 pg/mL — ABNORMAL HIGH (ref 0.0–100.0)
B Natriuretic Peptide: 4037.8 pg/mL — ABNORMAL HIGH (ref 0.0–100.0)

## 2021-09-30 LAB — CBG MONITORING, ED
Glucose-Capillary: 183 mg/dL — ABNORMAL HIGH (ref 70–99)
Glucose-Capillary: 246 mg/dL — ABNORMAL HIGH (ref 70–99)

## 2021-09-30 LAB — TROPONIN I (HIGH SENSITIVITY)
Troponin I (High Sensitivity): 65 ng/L — ABNORMAL HIGH (ref <18)
Troponin I (High Sensitivity): 63 ng/L — ABNORMAL HIGH (ref <18)
Troponin I (High Sensitivity): 63 ng/L — ABNORMAL HIGH (ref <18)

## 2021-09-30 MED ORDER — FUROSEMIDE 40 MG PO TABS
40.0000 mg | ORAL_TABLET | Freq: Every day | ORAL | Status: DC
  Administered 2021-10-01 – 2021-10-09 (×9): 40 mg via ORAL
  Filled 2021-09-30 (×4): qty 1
  Filled 2021-09-30: qty 2
  Filled 2021-09-30 (×4): qty 1

## 2021-09-30 MED ORDER — ACETAMINOPHEN 325 MG PO TABS
650.0000 mg | ORAL_TABLET | Freq: Four times a day (QID) | ORAL | Status: DC | PRN
  Administered 2021-10-03 – 2021-10-09 (×14): 650 mg via ORAL
  Filled 2021-09-30 (×14): qty 2

## 2021-09-30 MED ORDER — ALBUTEROL SULFATE (2.5 MG/3ML) 0.083% IN NEBU
10.0000 mg | INHALATION_SOLUTION | Freq: Once | RESPIRATORY_TRACT | Status: AC
  Administered 2021-09-30: 10 mg via RESPIRATORY_TRACT
  Filled 2021-09-30: qty 12

## 2021-09-30 MED ORDER — ONDANSETRON HCL 4 MG PO TABS: 4.0000 mg | ORAL_TABLET | Freq: Four times a day (QID) | ORAL | Status: DC | PRN

## 2021-09-30 MED ORDER — VANCOMYCIN HCL 1250 MG/250ML IV SOLN
1250.0000 mg | Freq: Once | INTRAVENOUS | Status: AC
  Administered 2021-09-30: 1250 mg via INTRAVENOUS
  Filled 2021-09-30: qty 250

## 2021-09-30 MED ORDER — LAMOTRIGINE 25 MG PO TABS
25.0000 mg | ORAL_TABLET | Freq: Every day | ORAL | Status: DC
  Administered 2021-10-01 – 2021-10-09 (×9): 25 mg via ORAL
  Filled 2021-09-30 (×9): qty 1

## 2021-09-30 MED ORDER — ACETAMINOPHEN 650 MG RE SUPP: 650.0000 mg | Freq: Four times a day (QID) | RECTAL | Status: DC | PRN

## 2021-09-30 MED ORDER — LABETALOL HCL 5 MG/ML IV SOLN
10.0000 mg | Freq: Once | INTRAVENOUS | Status: AC
  Administered 2021-09-30: 10 mg via INTRAVENOUS
  Filled 2021-09-30: qty 4

## 2021-09-30 MED ORDER — INSULIN ASPART 100 UNIT/ML IV SOLN
5.0000 [IU] | Freq: Once | INTRAVENOUS | Status: AC
  Administered 2021-09-30: 5 [IU] via INTRAVENOUS

## 2021-09-30 MED ORDER — INSULIN ASPART 100 UNIT/ML IJ SOLN
0.0000 [IU] | Freq: Every day | INTRAMUSCULAR | Status: DC
  Administered 2021-09-30 – 2021-10-01 (×2): 2 [IU] via SUBCUTANEOUS

## 2021-09-30 MED ORDER — HYDRALAZINE HCL 50 MG PO TABS
100.0000 mg | ORAL_TABLET | Freq: Two times a day (BID) | ORAL | Status: DC
  Administered 2021-09-30: 100 mg via ORAL
  Filled 2021-09-30: qty 2

## 2021-09-30 MED ORDER — ALBUTEROL SULFATE (2.5 MG/3ML) 0.083% IN NEBU: 2.5000 mg | INHALATION_SOLUTION | RESPIRATORY_TRACT | Status: DC | PRN

## 2021-09-30 MED ORDER — AMLODIPINE BESYLATE 5 MG PO TABS
10.0000 mg | ORAL_TABLET | Freq: Every day | ORAL | Status: DC
  Administered 2021-09-30: 10 mg via ORAL
  Filled 2021-09-30: qty 2

## 2021-09-30 MED ORDER — HEPARIN SODIUM (PORCINE) 5000 UNIT/ML IJ SOLN
5000.0000 [IU] | Freq: Three times a day (TID) | INTRAMUSCULAR | Status: DC
  Administered 2021-09-30 – 2021-10-09 (×26): 5000 [IU] via SUBCUTANEOUS
  Filled 2021-09-30 (×26): qty 1

## 2021-09-30 MED ORDER — VANCOMYCIN HCL 1250 MG/250ML IV SOLN
1250.0000 mg | Freq: Once | INTRAVENOUS | Status: DC
  Filled 2021-09-30: qty 250

## 2021-09-30 MED ORDER — INSULIN ASPART 100 UNIT/ML IJ SOLN
0.0000 [IU] | Freq: Three times a day (TID) | INTRAMUSCULAR | Status: DC
Start: 2021-10-01 — End: 2021-10-10
  Administered 2021-10-01: 2 [IU] via SUBCUTANEOUS
  Administered 2021-10-02 – 2021-10-08 (×4): 1 [IU] via SUBCUTANEOUS

## 2021-09-30 MED ORDER — GABAPENTIN 600 MG PO TABS
600.0000 mg | ORAL_TABLET | Freq: Two times a day (BID) | ORAL | Status: DC
  Administered 2021-10-01 – 2021-10-09 (×18): 600 mg via ORAL
  Filled 2021-09-30 (×19): qty 1

## 2021-09-30 MED ORDER — PRAVASTATIN SODIUM 40 MG PO TABS
80.0000 mg | ORAL_TABLET | Freq: Every day | ORAL | Status: DC
  Administered 2021-10-01 – 2021-10-09 (×9): 80 mg via ORAL
  Filled 2021-09-30 (×11): qty 2

## 2021-09-30 MED ORDER — LABETALOL HCL 5 MG/ML IV SOLN
10.0000 mg | Freq: Four times a day (QID) | INTRAVENOUS | Status: DC
  Administered 2021-10-01: 10 mg via INTRAVENOUS
  Filled 2021-09-30: qty 4

## 2021-09-30 MED ORDER — IPRATROPIUM-ALBUTEROL 0.5-2.5 (3) MG/3ML IN SOLN
3.0000 mL | Freq: Once | RESPIRATORY_TRACT | Status: AC
  Administered 2021-09-30: 3 mL via RESPIRATORY_TRACT
  Filled 2021-09-30: qty 3

## 2021-09-30 MED ORDER — SODIUM CHLORIDE 0.9 % IV SOLN
2.0000 g | Freq: Once | INTRAVENOUS | Status: AC
  Administered 2021-09-30: 2 g via INTRAVENOUS
  Filled 2021-09-30: qty 2

## 2021-09-30 MED ORDER — NITROGLYCERIN IN D5W 200-5 MCG/ML-% IV SOLN
0.0000 ug/min | INTRAVENOUS | Status: DC
  Administered 2021-09-30: 5 ug/min via INTRAVENOUS
  Filled 2021-09-30: qty 250

## 2021-09-30 MED ORDER — CHLORHEXIDINE GLUCONATE CLOTH 2 % EX PADS: 6.0000 | MEDICATED_PAD | Freq: Every day | CUTANEOUS | Status: DC

## 2021-09-30 MED ORDER — METHYLPREDNISOLONE SODIUM SUCC 125 MG IJ SOLR
125.0000 mg | Freq: Once | INTRAMUSCULAR | Status: AC
  Administered 2021-09-30: 125 mg via INTRAVENOUS
  Filled 2021-09-30: qty 2

## 2021-09-30 MED ORDER — ONDANSETRON HCL 4 MG/2ML IJ SOLN: 4.0000 mg | Freq: Four times a day (QID) | INTRAMUSCULAR | Status: DC | PRN

## 2021-09-30 MED ORDER — DEXTROSE 50 % IV SOLN
1.0000 | Freq: Once | INTRAVENOUS | Status: AC
  Administered 2021-09-30: 50 mL via INTRAVENOUS
  Filled 2021-09-30: qty 50

## 2021-09-30 NOTE — ED Notes (Addendum)
Enid Derry, pt's friend-2ndary contact, [phone]  Jesse [place], pt's niece, [phone]. Contact for updates. Georgiann Mohs, pt's mother, [phone]

## 2021-09-30 NOTE — ED Provider Notes (Signed)
  Physical Exam  BP (!) 220/90   Pulse (!) 104   Temp 98.1 F (36.7 C) (Oral)   Resp (!) 21   SpO2 97%     ED Course/Procedures     Procedures  MDM  4F, hx of CHF, ESRD on HD, presented with flash pulmonary edema, hyperK >6. At dialysis now, reassess but likely admit after dialysis.  On reassessment, the patient remained persistently hypertensive.  Her lungs on auscultation were clear to auscultation bilaterally, she is no longer requiring BiPAP.  She continues to be tachycardic and hypertensive with systolic blood pressures greater than 200.  She has evidence of endorgan damage on her laboratory work-up.  CTA PE study was ordered and pending.  Concern for hypertensive emergency with persistent hypertension despite volume removal during dialysis.  Milligrams of IV labetalol was administered which resulted in some improvement to the 138I systolic.  Respiratory status much improved, no respiratory distress, saturating well on room air.  Believe that the patient is currently hemodynamically stable for the floor, not requiring IV infusion of antihypertensives at this time.  Hospitalist medicine consulted for admission, Dr. Bridgett Larsson who accepts the patient in admission at this time.       Regan Lemming, MD 09/30/21 2152

## 2021-09-30 NOTE — Assessment & Plan Note (Signed)
Patient still smoking.  Chronic.

## 2021-09-30 NOTE — Consult Note (Signed)
ESRD Consult Note  Assessment/Recommendations:   # ESRD:  -outpatient orders: [facility], MWF, 2.5hrs, f180, 2k, 2.5cal, edw 52kg, aranesp 74mg qweekly, venofer 528mqwkly -HD today, next HD tentatively planned for 12/9  # Hyperkalemia -HD today  # SOB, pulm edema -UF as tolerated today  # Volume/ hypertension: EDW 52kg. Attempt to achieve EDW as tolerated  # Anemia of Chronic Kidney Disease: Hemoglobin 12.2. receives aranesp and FePraxairs an outpatient.   # Secondary Hyperparathyroidism/Hyperphosphatemia: resume home phoslo   # Vascular access: LUE AVF +b/t  # Additional recommendations: - Dose all meds for creatinine clearance < 10 ml/min  - Unless absolutely necessary, no MRIs with gadolinium.  - Implement save arm precautions.  Prefer needle sticks in the dorsum of the hands or wrists.  No blood pressure measurements in arm. - If blood transfusion is requested during hemodialysis sessions, please alert usKorearior to the session.  - If a hemodialysis catheter line culture is requested, please alert usKoreas only hemodialysis nurses are able to collect those specimens.   Recommendations were discussed with the primary team.  ViGean QuintMD [facility]  History of Present Illness: Tammy HEMRICKs a/an [age].o. female with a past medical history of ESRD, asthma/COPD, DM, HTN who presents with SOB. Was recently admitted here from 11/23 to 12/1 for SOB, cough, fevers. CXR was unremarkable and the source of her SIRS was unclear. She reports being out of her COPD maintenance medications. Was brought in by EMS, o2 sat reported to be 75% in the field. Last HD was on Monday. CXR here showed cardiomegaly with mild interstitial edema and bibasilar airspace disease with concern for infection. K was found to be 6.6. She reports that she makes a normal amount of urine. She also reports feeling much better with oxygen now in place. No other complaints  currently.  Medications:  Current Facility-Administered Medications  Medication Dose Route Frequency Provider Last Rate Last Admin   ceFEPIme (MAXIPIME) 2 g in sodium chloride 0.9 % 100 mL IVPB  2 g Intravenous Once DiGodfrey PickMD       nitroGLYCERIN 50 mg in dextrose 5 % 250 mL (0.2 mg/mL) infusion  0-200 mcg/min Intravenous Continuous DiGodfrey PickMD 22.5 mL/hr at 09/30/21 1205 75 mcg/min at 09/30/21 1205   vancomycin (VANCOREADY) IVPB 1250 mg/250 mL  1,250 mg Intravenous Once DiGodfrey PickMD       Current Outpatient Medications  Medication Sig Dispense Refill   acetaminophen (TYLENOL) 500 MG tablet Take 1,000 mg by mouth every 6 (six) hours as needed for mild pain.     amLODipine (NORVASC) 10 MG tablet TAKE 1 TABLET(10 MG) BY MOUTH DAILY (Patient taking differently: Take 10 mg by mouth daily.) 30 tablet 0   atropine 1 % ophthalmic solution Place 1 drop into both eyes 2 (two) times daily.     buPROPion (ZYBAN) 150 MG 12 hr tablet Take 150 mg by mouth daily.     calcium acetate (PHOSLO) 667 MG capsule Take 2,001 mg by mouth 3 (three) times daily with meals.     dorzolamide-timolol (COSOPT) 22.3-6.8 MG/ML ophthalmic solution Place 1 drop into both eyes 2 (two) times daily.     furosemide (LASIX) 40 MG tablet Take 1 tablet (40 mg total) by mouth daily. 30 tablet 3   gabapentin (NEURONTIN) 600 MG tablet Take 1 tablet (600 mg total) by mouth 2 (two) times daily. 180 tablet 3   hydrALAZINE (APRESOLINE) 100 MG  tablet Take 100 mg by mouth 2 (two) times daily.     lamoTRIgine (LAMICTAL) 25 MG tablet Take 25 mg by mouth daily.     latanoprost (XALATAN) 0.005 % ophthalmic solution Place 1 drop into both eyes at bedtime.     metoCLOPramide (REGLAN) 10 MG tablet Take 10 mg by mouth 2 (two) times daily as needed for nausea.     albuterol (VENTOLIN HFA) 108 (90 Base) MCG/ACT inhaler Inhale 2 puffs into the lungs every 4 (four) hours as needed for shortness of breath. For shortness of breath 8 g 11    blood glucose meter kit and supplies KIT Dispense based on patient and insurance preference. Use up to four times daily as directed. (FOR ICD-10 E.11.9) 1 each 0   diltiazem 2 % GEL Apply 1 application topically 5 (five) times daily. 30 g 1   glucose blood (ONE TOUCH ULTRA TEST) test strip USE TO TEST 3 TIMES A DAY 100 each 11   Insulin Pen Needle 29G X 12MM MISC E11.9 Dx Code Use once at bedtime with each nightly insulin 100 each 3   Lancets (ONETOUCH ULTRASOFT) lancets Use as instructed 100 each 12   pravastatin (PRAVACHOL) 80 MG tablet Take 1 tablet (80 mg total) by mouth daily. 30 tablet 3     ALLERGIES Acyclovir and related  MEDICAL HISTORY Past Medical History:  Diagnosis Date   Anemia    Anxiety    Blind right eye    Chronic kidney disease, stage V (HCC)    Constipation, chronic    Dental caries    Diabetes mellitus    Diabetic neuropathy (HCC)    Diabetic retinopathy    GERD (gastroesophageal reflux disease)    Glaucoma    H. pylori infection    Hepatitis C carrier ([place])    High risk sexual behavior    Hyperlipidemia    Hypertension    Hypoglycemia 07/12/2017   Insomnia    Microalbuminuria    Nonspecific elevation of levels of transaminase or lactic acid dehydrogenase (LDH)    Tobacco dependence    Vitamin D deficiency      SOCIAL HISTORY Social History   Socioeconomic History   Marital status: Divorced    Spouse name: Not on file   Number of children: 3   Years of education: 11   Highest education level: Not on file  Occupational History    Employer: DISABLED    Comment: Disabled  Tobacco Use   Smoking status: Every Day    Packs/day: 0.25    Types: Cigarettes   Smokeless tobacco: Never   Tobacco comments:    trying to quit  Vaping Use   Vaping Use: Never used  Substance and Sexual Activity   Alcohol use: Yes    Comment: 2-3 beers weekly   Drug use: Not Currently    Types: Marijuana, Cocaine    Comment: quit in 2018   Sexual activity: Yes   Other Topics Concern   Not on file  Social History Narrative   Patient lives at home alone.   Disabled.   Right handed.   Education 11 th grade.   Caffeine three cups of coffee daily.   No psychiatrist   Social Determinants of Health   Financial Resource Strain: Not on file  Food Insecurity: Not on file  Transportation Needs: Not on file  Physical Activity: Not on file  Stress: Not on file  Social Connections: Not on file  Intimate Partner Violence: Not  on file     FAMILY HISTORY Family History  Problem Relation Age of Onset   High blood pressure Mother    Diabetes Mother    Thyroid disease Mother    Diabetes Father    High blood pressure Father    Cerebral palsy Daughter    Other Son        still born     Review of Systems: 65 systems were reviewed and negative except per HPI  Physical Exam: Vitals:   09/30/21 1202 09/30/21 1205  BP: (!) 210/80 (!) 202/82  Pulse: (!) 108 (!) 109  Resp: 18 (!) 21  Temp:    SpO2: 100% 100%   No intake/output data recorded. No intake or output data in the 24 hours ending 09/30/21 1215 General: well-appearing, no acute distress HEENT: anicteric sclera, MMM CV: normal rate, no murmurs, no edema Lungs: bibasilar crackles Abd: soft, non-tender, non-distended Skin: no visible lesions or rashes Neuro: normal speech, no gross focal deficits  Dialysis access: lue avf +b/t  Test Results Reviewed Lab Results  Component Value Date   NA 133 (L) 09/30/2021   NA 133 (L) 09/30/2021   K 6.6 (HH) 09/30/2021   K 6.6 (HH) 09/30/2021   CL 107 09/30/2021   CO2 22 09/23/2021   BUN 84 (H) 09/30/2021   CREATININE 15.40 (H) 09/30/2021   CALCIUM 8.9 09/23/2021   ALBUMIN 2.5 (L) 09/21/2021   PHOS 4.2 09/18/2021    I have reviewed relevant outside healthcare records

## 2021-09-30 NOTE — Consult Note (Signed)
Hospitalist Consultation History and Physical    Tammy Moses VHQ:[id number] DOB: Dec 22, 1965 DOA: 09/30/2021  PCP: [name], FNP   Patient coming from: Home  I have personally briefly reviewed patient's old medical records in [place] Requesting provider: Armandina Gemma, MD Reason for consult: hypertensive emergency CC: SOB HPI: 55 year old African-American female with a history of end-stage renal disease needing dialysis on Monday Wednesday Friday with a history of noncompliance, type 2 diabetes, hypertension, anemia chronic disease, COPD, chronic tobacco abuse presented to the ER today via EMS with hypoxia and respiratory distress.  She was seen initially around 10:40 AM.  She had been scheduled for dialysis today.  She was brought in with room air saturations of 75%.  In route she was placed on 6 L of oxygen with 85% O2 saturations.  Patient seen initially in the ER.  Nephrology was urgently consulted.  She underwent urgent hemodialysis.  She was hypertensive on admission.  With blood pressure 231 over 55.  She was tachycardic.  Chest x-ray demonstrated pulmonary edema.  She was diagnosed with flash pulmonary edema.  She was placed briefly on BiPAP while waiting for urgent dialysis.  She underwent urgent dialysis today.  She was also hyperkalemic on admission to the ER with serum potassium of 6.1.  Her BNP was elevated at 3587.  After hemodialysis, the patient was no longer requiring supplemental oxygen and room air saturations were 97%.  She has however remained persistently hypertensive despite volume removal today.  Hemodialysis orders were to remove 3 to 4 L of fluid.  Despite ultrafiltration, patient's blood pressure remains 190/67.  Heart rate remains tachycardic with a heart rate of 101.  Due to the patient's malignant hypertension, [facility] hospitalist contacted for admission.  Of note, the patient still has not undergone her CTPA that was ordered earlier today.    ED  Course: noted to be in respiratory distress on arrival to the ER.  Placed on BiPAP.  Patient underwent urgent dialysis.  Review of Systems:  Review of Systems  Constitutional: Negative.   HENT: Negative.    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 Has difficulty with vision in right eye.  Respiratory:  Positive for shortness of breath.   Cardiovascular: Negative.   Gastrointestinal: Negative.   Genitourinary: Negative.   Musculoskeletal: Negative.   Skin: Negative.   Neurological: Negative.   Endo/Heme/Allergies: Negative.   Psychiatric/Behavioral: Negative.    All other systems reviewed and are negative.  Past Medical History:  Diagnosis Date   Anemia    Anxiety    Blind right eye    Chronic kidney disease, stage V (HCC)    Constipation, chronic    Dental caries    Diabetes mellitus    Diabetic neuropathy ([place])    Diabetic retinopathy    GERD (gastroesophageal reflux disease)    Glaucoma    H. pylori infection    Hepatitis C carrier ([place])    High risk sexual behavior    Hyperlipidemia    Hypertension    Hypoglycemia 07/12/2017   Insomnia    Microalbuminuria    Nonspecific elevation of levels of transaminase or lactic acid dehydrogenase (LDH)    Tobacco dependence    Vitamin D deficiency     Past Surgical History:  Procedure Laterality Date   ABDOMINAL HYSTERECTOMY  04/30/2009   PARTIAL   COLONOSCOPY     DIALYSIS FISTULA CREATION Left 06/2017   ESOPHAGOGASTRODUODENOSCOPY  reports that she has been smoking cigarettes. She has been smoking an average of .25 packs per day. She has never used smokeless tobacco. She reports current alcohol use. She reports that she does not currently use drugs after having used the following drugs: Marijuana and Cocaine.  Allergies  Allergen Reactions   Acyclovir And Related Itching    Family History  Problem Relation Age of Onset   High blood pressure Mother    Diabetes Mother    Thyroid disease Mother    Diabetes  Father    High blood pressure Father    Cerebral palsy Daughter    Other Son        still born    Prior to Admission medications   Medication Sig Start Date End Date Taking? Authorizing Provider  acetaminophen (TYLENOL) 500 MG tablet Take 1,000 mg by mouth every 6 (six) hours as needed for mild pain.   Yes [provider]  amLODipine (NORVASC) 10 MG tablet TAKE 1 TABLET(10 MG) BY MOUTH DAILY Patient taking differently: Take 10 mg by mouth daily. 02/10/21  Yes [name], FNP  atropine 1 % ophthalmic solution Place 1 drop into both eyes 2 (two) times daily.   Yes [provider]  buPROPion (ZYBAN) 150 MG 12 hr tablet Take 150 mg by mouth daily. 01/16/21  Yes [provider]  calcium acetate (PHOSLO) 667 MG capsule Take 2,001 mg by mouth 3 (three) times daily with meals. 07/06/18  Yes [provider]  dorzolamide-timolol (COSOPT) 22.3-6.8 MG/ML ophthalmic solution Place 1 drop into both eyes 2 (two) times daily. 12/08/20  Yes [provider]  furosemide (LASIX) 40 MG tablet Take 1 tablet (40 mg total) by mouth daily. 12/11/19  Yes [name], FNP  gabapentin (NEURONTIN) 600 MG tablet Take 1 tablet (600 mg total) by mouth 2 (two) times daily. 12/11/19  Yes [name], FNP  hydrALAZINE (APRESOLINE) 100 MG tablet Take 100 mg by mouth 2 (two) times daily. 05/19/20  Yes [provider]  lamoTRIgine (LAMICTAL) 25 MG tablet Take 25 mg by mouth daily. 01/16/21  Yes [provider]  latanoprost (XALATAN) 0.005 % ophthalmic solution Place 1 drop into both eyes at bedtime. 12/16/20  Yes [provider]  metoCLOPramide (REGLAN) 10 MG tablet Take 10 mg by mouth 2 (two) times daily as needed for nausea. 09/22/21  Yes [provider]  albuterol (VENTOLIN HFA) 108 (90 Base) MCG/ACT inhaler Inhale 2 puffs into the lungs every 4 (four) hours as needed for shortness of breath. For shortness of breath 12/11/19   [name], FNP  blood glucose meter kit and supplies KIT Dispense based on patient and insurance preference. Use up to four times daily as directed. (FOR ICD-10 E.11.9) 11/30/17   Scot Jun, FNP  diltiazem 2 % GEL Apply 1 application topically 5 (five) times daily. 06/19/21   Irene Shipper, MD  glucose blood (ONE TOUCH ULTRA TEST) test strip USE TO TEST 3 TIMES A DAY 08/15/20   [name], FNP  Insulin Pen Needle 29G X 12MM MISC E11.9 Dx Code Use once at bedtime with each nightly insulin 03/11/17   Scot Jun, FNP  Lancets [facility] ULTRASOFT) lancets Use as instructed 07/10/19   [name], FNP  pravastatin (PRAVACHOL) 80 MG tablet Take 1 tablet (80 mg total) by mouth daily. 12/11/19   [name], FNP    Physical Exam: Vitals:   09/30/21 1700 09/30/21  1730 09/30/21 1800 09/30/21 1839  BP: (!) 241/91 (!) 201/72 (!) 199/77 (!) 190/67  Pulse: (!) 103 (!) 103 (!) 102 (!) 101  Resp:    16  Temp:    98.1 F (36.7 C)  TempSrc:    Oral  SpO2:    97%    Physical Exam Vitals and nursing note reviewed.  Constitutional:      Appearance: She is not toxic-appearing or diaphoretic.     Comments: Chronically ill-appearing.  Appears much older than stated age of 55 years old.  HENT:     Head: Normocephalic and atraumatic.     Nose: Nose normal.  Eyes:     Comments: Cloudy cornea on the right eye.  Patient has no light perception in the left eye.  Neck:     Thyroid: No thyromegaly.  Cardiovascular:     Rate and Rhythm: Regular rhythm. Tachycardia present.  Pulmonary:     Effort: Pulmonary effort is normal. No respiratory distress.     Breath sounds: No wheezing or rales.  Abdominal:     General: Bowel sounds are normal. There is no distension.     Tenderness: There is no guarding.  Musculoskeletal:     Comments: Left upper extremity AV fistula.  Positive thrill and bruit.  Skin:    General: Skin is warm and dry.     Capillary Refill: Capillary refill takes  less than 2 seconds.  Neurological:     Mental Status: She is oriented to person, place, and time.     Labs on Admission: I have personally reviewed following labs and imaging studies  CBC: Recent Labs  Lab 09/24/21 0305 09/30/21 1055 09/30/21 1106  WBC 15.7* 21.3*  --   NEUTROABS 12.4* 16.9*  --   HGB 10.0* 10.7* 12.2  11.9*  HCT 31.4* 34.2* 36.0  35.0*  MCV 90.0 92.2  --   PLT 415* 518*  --    Basic Metabolic Panel: Recent Labs  Lab 09/30/21 1106 09/30/21 1200 09/30/21 1316  NA 133*  133* 133* 134*  K 6.6*  6.6* 6.1* 5.8*  CL 107 100 99  CO2  --  17* 17*  GLUCOSE 136* 163* 177*  BUN 84* 83* 83*  CREATININE 15.40* 13.70* 13.61*  CALCIUM  --  8.9 9.0   GFR: Estimated Creatinine Clearance: 3.4 mL/min (A) (by C-G formula based on SCr of 13.61 mg/dL (H)). Liver Function Tests: Recent Labs  Lab 09/30/21 1200 09/30/21 1316  AST 19 23  ALT 16 18  ALKPHOS 64 66  BILITOT 0.6 0.5  PROT 7.6 7.9  ALBUMIN 3.0* 3.2*   No results for input(s): LIPASE, AMYLASE in the last 168 hours. No results for input(s): AMMONIA in the last 168 hours. Coagulation Profile: No results for input(s): INR, PROTIME in the last 168 hours. Cardiac Enzymes: No results for input(s): CKTOTAL, CKMB, CKMBINDEX, TROPONINI in the last 168 hours. BNP (last 3 results) No results for input(s): PROBNP in the last 8760 hours. HbA1C: No results for input(s): HGBA1C in the last 72 hours. CBG: Recent Labs  Lab 09/23/21 2100 09/24/21 0721 09/24/21 1130 09/30/21 1301  GLUCAP 250* 119* 146* 183*   Lipid Profile: No results for input(s): CHOL, HDL, LDLCALC, TRIG, CHOLHDL, LDLDIRECT in the last 72 hours. Thyroid Function Tests: No results for input(s): TSH, T4TOTAL, FREET4, T3FREE, THYROIDAB in the last 72 hours. Anemia Panel: No results for input(s): VITAMINB12, FOLATE, FERRITIN, TIBC, IRON, RETICCTPCT in the last 72 hours. Urine analysis:  Component Value Date/Time   COLORURINE YELLOW  09/16/2021 1511   APPEARANCEUR HAZY (A) 09/16/2021 1511   LABSPEC 1.013 09/16/2021 1511   PHURINE 5.0 09/16/2021 1511   GLUCOSEU >=500 (A) 09/16/2021 1511   HGBUR SMALL (A) 09/16/2021 1511   BILIRUBINUR NEGATIVE 09/16/2021 1511   BILIRUBINUR Negative 12/11/2019 0809   KETONESUR 5 (A) 09/16/2021 1511   PROTEINUR 100 (A) 09/16/2021 1511   UROBILINOGEN 0.2 12/11/2019 0809   UROBILINOGEN 0.2 12/13/2017 1353   NITRITE NEGATIVE 09/16/2021 1511   LEUKOCYTESUR NEGATIVE 09/16/2021 1511    Radiological Exams on Admission: I have personally reviewed images DG Chest Port 1 View  Result Date: 09/30/2021 CLINICAL DATA:  Shortness of breath.  COPD. EXAM: PORTABLE CHEST 1 VIEW COMPARISON:  09/20/2021 FINDINGS: Midline trachea. Cardiomegaly accentuated by AP portable technique. Atherosclerosis in the transverse aorta. No pneumothorax. Suspect trace right pleural fluid. Interstitial edema, as evidenced by septal thickening and interstitial indistinctness. Right greater than left base airspace disease. IMPRESSION: Cardiomegaly with mild interstitial edema. Bibasilar airspace disease. Although this could represent atelectasis, especially in the medial right lung base, concurrent infection is a concern. Possible trace right pleural fluid. Aortic Atherosclerosis (ICD10-I70.0). Electronically Signed   By: Abigail Miyamoto M.D.   On: 09/30/2021 11:14    EKG: I have personally reviewed EKG: sinus tachycardia   Assessment/Plan Principal Problem:   Malignant hypertension Active Problems:   Flash pulmonary edema (HCC)   Non compliance with medical treatment   BLINDNESS, RIGHT EYE   Type 2 DM with hypertension and ESRD on dialysis ([place])   COPD (chronic obstructive pulmonary disease) (HCC)   ESRD (end stage renal disease) ([place]) -  Dialyzes at [facility].  On Monday Wednesday Friday.    Malignant hypertension Given that the patient remains significantly hypertensive despite volume removal via hemodialysis, the  patient needs aggressive blood pressure control.  Appears patient's respiratory distress has been improved with volume removal as the patient is no longer requiring BiPAP and supplemental oxygen.  Discussed the case with the EDP recommended the patient's start either labetalol drip or Cardene drip.  EDP is going to check whether or not either of these drips can be given in a progressive care setting.  Given that the patient has likely been hypertensive for at least a day if not more, aggressive decrease in her blood pressure could have deleterious effects.  Would recommend dropping her blood pressure no more than 20% over the next 12-24 hours.  Flash pulmonary edema (HCC) Present on admission.  This is improved with volume removal via hemodialysis.  Patient is no longer hypoxic.  Patient is a long requiring BiPAP therapy.  Non compliance with medical treatment Patient with a history of noncompliance per nephrology note.  Patient is supposed to be on hemodialysis Monday Wednesday Friday at [facility] per nephrology note.  Patient states that she frequently misses her hemodialysis session due to not being dressed in time to leave for her dialysis.  She states that she has an aide that does help her in the morning but she cannot control what time the aide gets to her house.  I suggested to her that if she is having difficulty with getting dressed in the morning prior to her hemodialysis session that she wears comfortable clothing to bed the night before so she does not have to worry about getting dressed before hemodialysis.  BLINDNESS, RIGHT EYE Patient has some light perception in the right eye.  She states that has difficulty after gets  dark at night.  Type 2 DM with hypertension and ESRD on dialysis ([place]) Chronic.  COPD (chronic obstructive pulmonary disease) ([place]) Patient still smoking.  Chronic.  ESRD (end stage renal disease) ([place]) -  Dialyzes at [facility].  On Monday Wednesday  Friday. Noncompliant with hemodialysis.  Dialyzes at [facility].  On Monday Wednesday Friday.  DVT prophylaxis: SQ Heparin Code Status: Full Code Family Communication: no family at bedside  Disposition Plan: return home  Consults called: EDP consulted nephrology   Kristopher Oppenheim, DO [facility] 09/30/2021, 8:09 PM

## 2021-09-30 NOTE — Subjective & Objective (Signed)
CC: SOB HPI: 55 year old African-American female with a history of end-stage renal disease needing dialysis on Monday Wednesday Friday with a history of noncompliance, type 2 diabetes, hypertension, anemia chronic disease, COPD, chronic tobacco abuse presented to the ER today via EMS with hypoxia and respiratory distress.  She was seen initially around 10:40 AM.  She had been scheduled for dialysis today.  She was brought in with room air saturations of 75%.  In route she was placed on 6 L of oxygen with 85% O2 saturations.  Patient seen initially in the ER.  Nephrology was urgently consulted.  She underwent urgent hemodialysis.  She was hypertensive on admission.  With blood pressure 231 over 55.  She was tachycardic.  Chest x-ray demonstrated pulmonary edema.  She was diagnosed with flash pulmonary edema.  She was placed briefly on BiPAP while waiting for urgent dialysis.  She underwent urgent dialysis today.  She was also hyperkalemic on admission to the ER with serum potassium of 6.1.  Her BNP was elevated at 3587.  After hemodialysis, the patient was no longer requiring supplemental oxygen and room air saturations were 97%.  She has however remained persistently hypertensive despite volume removal today.  Hemodialysis orders were to remove 3 to 4 L of fluid.  Despite ultrafiltration, patient's blood pressure remains 190/67.  Heart rate remains tachycardic with a heart rate of 101.  Due to the patient's malignant hypertension, [facility] hospitalist contacted for admission.  Of note, the patient still has not undergone her CTPA that was ordered earlier today.

## 2021-09-30 NOTE — Assessment & Plan Note (Addendum)
Patient with a history of noncompliance per nephrology note.  Patient is supposed to be on hemodialysis Monday Wednesday Friday at [facility] per nephrology note.  Patient states that she frequently misses her hemodialysis session due to not being dressed in time to leave for her dialysis.  She states that she has an aide that does help her in the morning but she cannot control what time the aide gets to her house.  I suggested to her that if she is having difficulty with getting dressed in the morning prior to her hemodialysis session that she wears comfortable clothing to bed the night before so she does not have to worry about getting dressed before hemodialysis.

## 2021-09-30 NOTE — Assessment & Plan Note (Signed)
Noncompliant with hemodialysis.  Dialyzes at [facility].  On Monday Wednesday Friday.

## 2021-09-30 NOTE — ED Triage Notes (Signed)
Patient BIB [facility] from home for evaluation of shortness of breath, also complains of anterior chest wall pain. Patient reports history of COPD and has been out of her maintenance medications x1 week. Left sided wheezing and ronchi per EMS. Patient alert, oriented, speaking in complete sentences, and in no apparent distress at this time. Room air sat at home with EMS 75%, 85% on [place] 6L O2.  Does not wear supplemental O2 at baseline.   Patient was scheduled for dialysis today at 1100, states she has not missed any previous appointments, dialysis access in left arm.   EMS vitals CBG 131 HR 104 RR 24 BP 136/88

## 2021-09-30 NOTE — Assessment & Plan Note (Signed)
Present on admission.  This is improved with volume removal via hemodialysis.  Patient is no longer hypoxic.  Patient is a long requiring BiPAP therapy.

## 2021-09-30 NOTE — ED Notes (Signed)
Pt taken off of bipap per [place], so pt could go to dialysis

## 2021-09-30 NOTE — ACP (Advance Care Planning) (Signed)
  Advance Care Planning  Reason for Advance Care Planning Conversation: Acute hospitalization Principal Problem:   Malignant hypertension Active Problems:   Flash pulmonary edema (HCC)   Non compliance with medical treatment   BLINDNESS, RIGHT EYE   Type 2 DM with hypertension and ESRD on dialysis ([place])   COPD (chronic obstructive pulmonary disease) ([place])   ESRD (end stage renal disease) ([place]) -  Dialyzes at [facility].  On Monday Wednesday Friday.    I discussed with patient about advance care planning. Specifically, we discussed whether patient would desire cardiopulmonary resuscitation (CPR) in the event of acute cardiopulmonary arrest. We also discussed whether endotracheal intubation and temporary ventilator life support would be desired in the event of acute cardio- or pulmonary decompensation.   Code status order of Full Code has been entered in accord with the patient's wishes. with intubation  Living will: no  Health Care Agent / Tammy Moses              Name: Tammy Moses: Relationship to Patient: friend   Is agent appointed in legal document no  Time spent today in ACP discussion was  5 mins  Kristopher Oppenheim, DO [facility]

## 2021-09-30 NOTE — ED Notes (Signed)
Placed pt on 6L 02 per [place] 

## 2021-09-30 NOTE — H&P (Signed)
History and Physical    Tammy Moses HYQ:[id number] DOB: June 19, 1966 DOA: 09/30/2021  PCP: [name], FNP   Patient coming from: Home  I have personally briefly reviewed patient's old medical records in [place]  CC: SOB HPI: 55 year old African-American female with a history of end-stage renal disease needing dialysis on Monday Wednesday Friday with a history of noncompliance, type 2 diabetes, hypertension, anemia chronic disease, COPD, chronic tobacco abuse presented to the ER today via EMS with hypoxia and respiratory distress.  She was seen initially around 10:40 AM.  She had been scheduled for dialysis today.  She was brought in with room air saturations of 75%.  In route she was placed on 6 L of oxygen with 85% O2 saturations.  Patient seen initially in the ER.  Nephrology was urgently consulted.  She underwent urgent hemodialysis.  She was hypertensive on admission.  With blood pressure 231 over 55.  She was tachycardic.  Chest x-ray demonstrated pulmonary edema.  She was diagnosed with flash pulmonary edema.  She was placed briefly on BiPAP while waiting for urgent dialysis.  She underwent urgent dialysis today.  She was also hyperkalemic on admission to the ER with serum potassium of 6.1.  Her BNP was elevated at 3587.  After hemodialysis, the patient was no longer requiring supplemental oxygen and room air saturations were 97%.  She has however remained persistently hypertensive despite volume removal today.  Hemodialysis orders were to remove 3 to 4 L of fluid.  Despite ultrafiltration, patient's blood pressure remains 190/67.  Heart rate remains tachycardic with a heart rate of 101.  Due to the patient's malignant hypertension, [facility] hospitalist contacted for admission.  Of note, the patient still has not undergone her CTPA that was ordered earlier today.    ED Course: noted to be in respiratory distress on arrival to the ER.  Placed on BiPAP.  Patient  underwent urgent dialysis.  Review of Systems:  Review of Systems  Constitutional: Negative.   HENT: Negative.    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 Has difficulty with vision in right eye.  Respiratory:  Positive for shortness of breath.   Cardiovascular: Negative.   Gastrointestinal: Negative.   Genitourinary: Negative.   Musculoskeletal: Negative.   Skin: Negative.   Neurological: Negative.   Endo/Heme/Allergies: Negative.   Psychiatric/Behavioral: Negative.    All other systems reviewed and are negative.  Past Medical History:  Diagnosis Date   Anemia    Anxiety    Blind right eye    Chronic kidney disease, stage V (HCC)    Constipation, chronic    Dental caries    Diabetes mellitus    Diabetic neuropathy ([place])    Diabetic retinopathy    GERD (gastroesophageal reflux disease)    Glaucoma    H. pylori infection    Hepatitis C carrier ([place])    High risk sexual behavior    Hyperlipidemia    Hypertension    Hypoglycemia 07/12/2017   Insomnia    Microalbuminuria    Nonspecific elevation of levels of transaminase or lactic acid dehydrogenase (LDH)    Tobacco dependence    Vitamin D deficiency     Past Surgical History:  Procedure Laterality Date   ABDOMINAL HYSTERECTOMY  04/30/2009   PARTIAL   COLONOSCOPY     DIALYSIS FISTULA CREATION Left 06/2017   ESOPHAGOGASTRODUODENOSCOPY       reports that she has been smoking cigarettes. She has  been smoking an average of .25 packs per day. She has never used smokeless tobacco. She reports current alcohol use. She reports that she does not currently use drugs after having used the following drugs: Marijuana and Cocaine.  Allergies  Allergen Reactions   Acyclovir And Related Itching    Family History  Problem Relation Age of Onset   High blood pressure Mother    Diabetes Mother    Thyroid disease Mother    Diabetes Father    High blood pressure Father    Cerebral palsy Daughter    Other Son         still born    Prior to Admission medications   Medication Sig Start Date End Date Taking? Authorizing Provider  acetaminophen (TYLENOL) 500 MG tablet Take 1,000 mg by mouth every 6 (six) hours as needed for mild pain.   Yes [provider]  amLODipine (NORVASC) 10 MG tablet TAKE 1 TABLET(10 MG) BY MOUTH DAILY Patient taking differently: Take 10 mg by mouth daily. 02/10/21  Yes [name], FNP  atropine 1 % ophthalmic solution Place 1 drop into both eyes 2 (two) times daily.   Yes [provider]  buPROPion (ZYBAN) 150 MG 12 hr tablet Take 150 mg by mouth daily. 01/16/21  Yes [provider]  calcium acetate (PHOSLO) 667 MG capsule Take 2,001 mg by mouth 3 (three) times daily with meals. 07/06/18  Yes [provider]  dorzolamide-timolol (COSOPT) 22.3-6.8 MG/ML ophthalmic solution Place 1 drop into both eyes 2 (two) times daily. 12/08/20  Yes [provider]  furosemide (LASIX) 40 MG tablet Take 1 tablet (40 mg total) by mouth daily. 12/11/19  Yes [name], FNP  gabapentin (NEURONTIN) 600 MG tablet Take 1 tablet (600 mg total) by mouth 2 (two) times daily. 12/11/19  Yes [name], FNP  hydrALAZINE (APRESOLINE) 100 MG tablet Take 100 mg by mouth 2 (two) times daily. 05/19/20  Yes [provider]  lamoTRIgine (LAMICTAL) 25 MG tablet Take 25 mg by mouth daily. 01/16/21  Yes [provider]  latanoprost (XALATAN) 0.005 % ophthalmic solution Place 1 drop into both eyes at bedtime. 12/16/20  Yes [provider]  metoCLOPramide (REGLAN) 10 MG tablet Take 10 mg by mouth 2 (two) times daily as needed for nausea. 09/22/21  Yes [provider]  albuterol (VENTOLIN HFA) 108 (90 Base) MCG/ACT inhaler Inhale 2 puffs into the lungs every 4 (four) hours as needed for shortness of breath. For shortness of breath 12/11/19   [name], FNP  blood glucose meter kit and supplies KIT Dispense based on patient and  insurance preference. Use up to four times daily as directed. (FOR ICD-10 E.11.9) 11/30/17   Scot Jun, FNP  diltiazem 2 % GEL Apply 1 application topically 5 (five) times daily. 06/19/21   Irene Shipper, MD  glucose blood (ONE TOUCH ULTRA TEST) test strip USE TO TEST 3 TIMES A DAY 08/15/20   [name], FNP  Insulin Pen Needle 29G X 12MM MISC E11.9 Dx Code Use once at bedtime with each nightly insulin 03/11/17   Scot Jun, FNP  Lancets [facility] ULTRASOFT) lancets Use as instructed 07/10/19   [name], FNP  pravastatin (PRAVACHOL) 80 MG tablet Take 1 tablet (80 mg total) by mouth daily. 12/11/19   [name], FNP    Physical Exam: Vitals:   09/30/21 1700 09/30/21 1730 09/30/21 1800 09/30/21 1839  BP: (!) 241/91 Marland Kitchen)  201/72 (!) 199/77 (!) 190/67  Pulse: (!) 103 (!) 103 (!) 102 (!) 101  Resp:    16  Temp:    98.1 F (36.7 C)  TempSrc:    Oral  SpO2:    97%    Physical Exam Vitals and nursing note reviewed.  Constitutional:      Appearance: She is not toxic-appearing or diaphoretic.     Comments: Chronically ill-appearing.  Appears much older than stated age of 55 years old.  HENT:     Head: Normocephalic and atraumatic.     Nose: Nose normal.  Eyes:     Comments: Cloudy cornea on the right eye.  Patient has no light perception in the left eye.  Neck:     Thyroid: No thyromegaly.  Cardiovascular:     Rate and Rhythm: Regular rhythm. Tachycardia present.  Pulmonary:     Effort: Pulmonary effort is normal. No respiratory distress.     Breath sounds: No wheezing or rales.  Abdominal:     General: Bowel sounds are normal. There is no distension.     Tenderness: There is no guarding.  Musculoskeletal:     Comments: Left upper extremity AV fistula.  Positive thrill and bruit.  Skin:    General: Skin is warm and dry.     Capillary Refill: Capillary refill takes less than 2 seconds.  Neurological:     Mental Status: She is oriented to person,  place, and time.     Labs on Admission: I have personally reviewed following labs and imaging studies  CBC: Recent Labs  Lab 09/24/21 0305 09/30/21 1055 09/30/21 1106 09/30/21 2118  WBC 15.7* 21.3*  --  25.9*  NEUTROABS 12.4* 16.9*  --   --   HGB 10.0* 10.7* 12.2  11.9* 9.7*  HCT 31.4* 34.2* 36.0  35.0* 30.2*  MCV 90.0 92.2  --  91.0  PLT 415* 518*  --  015   Basic Metabolic Panel: Recent Labs  Lab 09/30/21 1106 09/30/21 1200 09/30/21 1316  NA 133*  133* 133* 134*  K 6.6*  6.6* 6.1* 5.8*  CL 107 100 99  CO2  --  17* 17*  GLUCOSE 136* 163* 177*  BUN 84* 83* 83*  CREATININE 15.40* 13.70* 13.61*  CALCIUM  --  8.9 9.0   GFR: Estimated Creatinine Clearance: 3.4 mL/min (A) (by C-G formula based on SCr of 13.61 mg/dL (H)). Liver Function Tests: Recent Labs  Lab 09/30/21 1200 09/30/21 1316  AST 19 23  ALT 16 18  ALKPHOS 64 66  BILITOT 0.6 0.5  PROT 7.6 7.9  ALBUMIN 3.0* 3.2*   No results for input(s): LIPASE, AMYLASE in the last 168 hours. No results for input(s): AMMONIA in the last 168 hours. Coagulation Profile: No results for input(s): INR, PROTIME in the last 168 hours. Cardiac Enzymes: No results for input(s): CKTOTAL, CKMB, CKMBINDEX, TROPONINI in the last 168 hours. BNP (last 3 results) No results for input(s): PROBNP in the last 8760 hours. HbA1C: No results for input(s): HGBA1C in the last 72 hours. CBG: Recent Labs  Lab 09/24/21 0721 09/24/21 1130 09/30/21 1301  GLUCAP 119* 146* 183*   Lipid Profile: No results for input(s): CHOL, HDL, LDLCALC, TRIG, CHOLHDL, LDLDIRECT in the last 72 hours. Thyroid Function Tests: No results for input(s): TSH, T4TOTAL, FREET4, T3FREE, THYROIDAB in the last 72 hours. Anemia Panel: No results for input(s): VITAMINB12, FOLATE, FERRITIN, TIBC, IRON, RETICCTPCT in the last 72 hours. Urine analysis:    Component  Value Date/Time   COLORURINE YELLOW 09/16/2021 1511   APPEARANCEUR HAZY (A) 09/16/2021 1511    LABSPEC 1.013 09/16/2021 1511   PHURINE 5.0 09/16/2021 1511   GLUCOSEU >=500 (A) 09/16/2021 1511   HGBUR SMALL (A) 09/16/2021 1511   BILIRUBINUR NEGATIVE 09/16/2021 1511   BILIRUBINUR Negative 12/11/2019 0809   KETONESUR 5 (A) 09/16/2021 1511   PROTEINUR 100 (A) 09/16/2021 1511   UROBILINOGEN 0.2 12/11/2019 0809   UROBILINOGEN 0.2 12/13/2017 1353   NITRITE NEGATIVE 09/16/2021 1511   LEUKOCYTESUR NEGATIVE 09/16/2021 1511    Radiological Exams on Admission: I have personally reviewed images DG Chest Port 1 View  Result Date: 09/30/2021 CLINICAL DATA:  Shortness of breath.  COPD. EXAM: PORTABLE CHEST 1 VIEW COMPARISON:  09/20/2021 FINDINGS: Midline trachea. Cardiomegaly accentuated by AP portable technique. Atherosclerosis in the transverse aorta. No pneumothorax. Suspect trace right pleural fluid. Interstitial edema, as evidenced by septal thickening and interstitial indistinctness. Right greater than left base airspace disease. IMPRESSION: Cardiomegaly with mild interstitial edema. Bibasilar airspace disease. Although this could represent atelectasis, especially in the medial right lung base, concurrent infection is a concern. Possible trace right pleural fluid. Aortic Atherosclerosis (ICD10-I70.0). Electronically Signed   By: Abigail Miyamoto M.D.   On: 09/30/2021 11:14    EKG: I have personally reviewed EKG: sinus tachycardia   Assessment/Plan Principal Problem:   Malignant hypertension Active Problems:   Flash pulmonary edema (HCC)   Non compliance with medical treatment   BLINDNESS, RIGHT EYE   Type 2 DM with hypertension and ESRD on dialysis ([place])   COPD (chronic obstructive pulmonary disease) (HCC)   ESRD (end stage renal disease) ([place]) -  Dialyzes at [facility].  On Monday Wednesday Friday.    Malignant hypertension Admit to medical telemetry observation bed. Pt's BP has improved with IV labetalol. No need for IV cardene gtts.  Continue with scheduled IV labetalol. Continue  with hold HTN meds. Will need nephrology assistance to help tailoring pt's HTN meds to the most simple regimens as possible. Ideally only once a day medication to increase her chances of compliance.  Flash pulmonary edema (HCC) Present on admission.  This is improved with volume removal via hemodialysis.  Patient is no longer hypoxic.  Patient is a long requiring BiPAP therapy.  Non compliance with medical treatment Patient with a history of noncompliance per nephrology note.  Patient is supposed to be on hemodialysis Monday Wednesday Friday at [facility] per nephrology note.  Patient states that she frequently misses her hemodialysis session due to not being dressed in time to leave for her dialysis.  She states that she has an aide that does help her in the morning but she cannot control what time the aide gets to her house.  I suggested to her that if she is having difficulty with getting dressed in the morning prior to her hemodialysis session that she wears comfortable clothing to bed the night before so she does not have to worry about getting dressed before hemodialysis.  BLINDNESS, RIGHT EYE Patient has some light perception in the right eye.  She states that has difficulty after gets dark at night.  Type 2 DM with hypertension and ESRD on dialysis ([place]) Chronic.  COPD (chronic obstructive pulmonary disease) ([place]) Patient still smoking.  Chronic.  ESRD (end stage renal disease) ([place]) -  Dialyzes at [facility].  On Monday Wednesday Friday. Noncompliant with hemodialysis.  Dialyzes at [facility].  On Monday Wednesday Friday.  DVT prophylaxis: SQ Heparin Code  Status: Full Code Family Communication: no family at bedside  Disposition Plan: return home  Consults called: nephrology  Admission status: Observation, Telemetry bed   Kristopher Oppenheim, DO [facility] 09/30/2021, 9:36 PM

## 2021-09-30 NOTE — Assessment & Plan Note (Signed)
Patient has some light perception in the right eye.  She states that has difficulty after gets dark at night.

## 2021-09-30 NOTE — Assessment & Plan Note (Addendum)
Admit to medical telemetry observation bed. Pt's BP has improved with IV labetalol. No need for IV cardene gtts.  Continue with scheduled IV labetalol. Continue with hold HTN meds. Will need nephrology assistance to help tailoring pt's HTN meds to the most simple regimens as possible. Ideally only once a day medication to increase her chances of compliance.

## 2021-09-30 NOTE — Assessment & Plan Note (Signed)
Chronic. 

## 2021-09-30 NOTE — ED Provider Notes (Signed)
[facility] EMERGENCY DEPARTMENT Provider Note   CSN: [id number] Arrival date & time: 09/30/21  1041     History Chief Complaint  Patient presents with   Shortness of Breath    Tammy Moses is a 55 y.o. female.   Shortness of Breath Associated symptoms: chest pain, cough and wheezing   Associated symptoms: no abdominal pain, no ear pain, no fever, no neck pain, no rash, no sore throat and no vomiting   Patient presents for shortness of breath, anterior chest wall pain, and hypoxia.  Medical history is notable for ESRD, asthma, COPD, DM, HTN.  She had a recent hospitalization from 11/23-12/1.  During this hospitalization, she was treated for pneumonia.  She reports shortness of breath over the past week.  She also states that she has not had her COPD maintenance medications.  This morning, her shortness of breath worsened.  EMS was called.  EMS noted her to be hypoxic on scene.  She was reportedly 75% SPO2 on room air.  She was given a DuoNeb breathing treatment during transit and she does report some improvement with this.  She states that she gets HD on M, W, F and did get a full session on Monday.  She does not wear supplemental oxygen at baseline.  Currently, she endorses right sided anterior chest pain.  Onset of this pain was this morning.  She denies any recent vomiting.  She does state that she has been having diarrhea.  Currently, she endorses chills.    Past Medical History:  Diagnosis Date   Anemia    Anxiety    Blind right eye    Chronic kidney disease, stage V (HCC)    Constipation, chronic    Dental caries    Diabetes mellitus    Diabetic neuropathy ([place])    Diabetic retinopathy    GERD (gastroesophageal reflux disease)    Glaucoma    H. pylori infection    Hepatitis C carrier ([place])    High risk sexual behavior    Hyperlipidemia    Hypertension    Hypoglycemia 07/12/2017   Insomnia    Microalbuminuria    Nonspecific elevation of levels of  transaminase or lactic acid dehydrogenase (LDH)    Tobacco dependence    Vitamin D deficiency     Patient Active Problem List   Diagnosis Date Noted   Foot pain, right 09/22/2021   SIRS (systemic inflammatory response syndrome) ([place]) 09/16/2021   Increased anion gap metabolic acidosis 84/16/6063   Elevated troponin 09/16/2021   Anemia due to chronic kidney disease 09/16/2021   Impaired functional mobility and activity tolerance 01/04/2019   Pneumonia of both lungs due to infectious organism 10/05/2017   Fall    Hypoglycemia 07/12/2017   Anemia 01/60/1093   Alcoholic intoxication without complication (HCC)    COPD (chronic obstructive pulmonary disease) ([place]) 06/24/2017   ESRD (end stage renal disease) ([place]) 06/17/2017   Foot drop 05/26/2017   Carpal tunnel syndrome of left wrist 05/20/2017   Ulnar neuropathy of left upper extremity 05/20/2017   Poor compliance with medication 05/17/2017   Peripheral neuropathy 05/10/2017   Diabetes mellitus ([place]) 05/10/2017   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   Vitamin D deficiency 04/07/2017   Absolute glaucoma of right eye 12/27/2016   Chronic angle-closure glaucoma of left eye, severe stage 12/27/2016   PCO (posterior capsule opacification), left 12/27/2016   Gait abnormality 03/07/2014   Abnormality of gait 03/07/2014   Hepatitis C virus infection without  hepatic coma 03/04/2014   History of hepatitis C 03/04/2014   Erosive gastritis 02/14/2014   Pseudophakia of left eye 10/30/2012   Asthma 10/13/2012   Reflux 10/13/2012   Lens replaced by other means 10/12/2012   Primary open angle glaucoma 10/12/2012   BACK PAIN, LUMBAR 08/18/2010   IDDM 08/11/2010   HYPERCHOLESTEROLEMIA 08/11/2010   HYPOKALEMIA 08/11/2010   DEPRESSION 08/11/2010   GLAUCOMA 08/11/2010   BLINDNESS, RIGHT EYE 08/11/2010   Essential hypertension 08/11/2010   GERD 08/11/2010   CONSTIPATION 08/06/2009    Past Surgical History:  Procedure Laterality Date    ABDOMINAL HYSTERECTOMY  04/30/2009   PARTIAL   COLONOSCOPY     DIALYSIS FISTULA CREATION Left 06/2017   ESOPHAGOGASTRODUODENOSCOPY       OB History   No obstetric history on file.     Family History  Problem Relation Age of Onset   High blood pressure Mother    Diabetes Mother    Thyroid disease Mother    Diabetes Father    High blood pressure Father    Cerebral palsy Daughter    Other Son        still born    Social History   Tobacco Use   Smoking status: Every Day    Packs/day: 0.25    Types: Cigarettes   Smokeless tobacco: Never   Tobacco comments:    trying to quit  Vaping Use   Vaping Use: Never used  Substance Use Topics   Alcohol use: Yes    Comment: 2-3 beers weekly   Drug use: Not Currently    Types: Marijuana, Cocaine    Comment: quit in 2018    Home Medications Prior to Admission medications   Medication Sig Start Date End Date Taking? Authorizing Provider  acetaminophen (TYLENOL) 500 MG tablet Take 1,000 mg by mouth every 6 (six) hours as needed for mild pain.   Yes [provider]  amLODipine (NORVASC) 10 MG tablet TAKE 1 TABLET(10 MG) BY MOUTH DAILY Patient taking differently: Take 10 mg by mouth daily. 02/10/21  Yes [name], FNP  atropine 1 % ophthalmic solution Place 1 drop into both eyes 2 (two) times daily.   Yes [provider]  buPROPion (ZYBAN) 150 MG 12 hr tablet Take 150 mg by mouth daily. 01/16/21  Yes [provider]  calcium acetate (PHOSLO) 667 MG capsule Take 2,001 mg by mouth 3 (three) times daily with meals. 07/06/18  Yes [provider]  dorzolamide-timolol (COSOPT) 22.3-6.8 MG/ML ophthalmic solution Place 1 drop into both eyes 2 (two) times daily. 12/08/20  Yes [provider]  furosemide (LASIX) 40 MG tablet Take 1 tablet (40 mg total) by mouth daily. 12/11/19  Yes [name], FNP  gabapentin (NEURONTIN) 600 MG tablet Take 1 tablet (600 mg total) by mouth 2 (two) times  daily. 12/11/19  Yes [name], FNP  hydrALAZINE (APRESOLINE) 100 MG tablet Take 100 mg by mouth 2 (two) times daily. 05/19/20  Yes [provider]  lamoTRIgine (LAMICTAL) 25 MG tablet Take 25 mg by mouth daily. 01/16/21  Yes [provider]  latanoprost (XALATAN) 0.005 % ophthalmic solution Place 1 drop into both eyes at bedtime. 12/16/20  Yes [provider]  metoCLOPramide (REGLAN) 10 MG tablet Take 10 mg by mouth 2 (two) times daily as needed for nausea. 09/22/21  Yes [provider]  albuterol (VENTOLIN HFA) 108 (90 Base) MCG/ACT inhaler Inhale 2 puffs into the lungs every 4 (four)  hours as needed for shortness of breath. For shortness of breath 12/11/19   [name], FNP  blood glucose meter kit and supplies KIT Dispense based on patient and insurance preference. Use up to four times daily as directed. (FOR ICD-10 E.11.9) 11/30/17   Scot Jun, FNP  diltiazem 2 % GEL Apply 1 application topically 5 (five) times daily. 06/19/21   Irene Shipper, MD  glucose blood (ONE TOUCH ULTRA TEST) test strip USE TO TEST 3 TIMES A DAY 08/15/20   [name], FNP  Insulin Pen Needle 29G X 12MM MISC E11.9 Dx Code Use once at bedtime with each nightly insulin 03/11/17   Scot Jun, FNP  Lancets [facility] ULTRASOFT) lancets Use as instructed 07/10/19   [name], FNP  pravastatin (PRAVACHOL) 80 MG tablet Take 1 tablet (80 mg total) by mouth daily. 12/11/19   [name], FNP    Allergies    Acyclovir and related  Review of Systems   Review of Systems  Constitutional:  Positive for chills and fatigue. Negative for appetite change and fever.  HENT:  Negative for congestion, ear pain and sore throat.   Eyes:  Negative for pain and visual disturbance.  Respiratory:  Positive for cough, chest tightness, shortness of breath and wheezing.   Cardiovascular:  Positive for chest pain. Negative for palpitations and leg swelling.   Gastrointestinal:  Positive for diarrhea. Negative for abdominal distention, abdominal pain, nausea and vomiting.  Genitourinary:  Negative for dysuria, flank pain, hematuria and pelvic pain.  Musculoskeletal:  Negative for arthralgias, back pain, gait problem, myalgias and neck pain.  Skin:  Negative for color change and rash.  Neurological:  Negative for dizziness, seizures, syncope, weakness, light-headedness and numbness.  Hematological:  Does not bruise/bleed easily.  Psychiatric/Behavioral:  Negative for confusion and decreased concentration.   All other systems reviewed and are negative.  Physical Exam Updated Vital Signs BP (!) 241/81   Pulse (!) 105   Temp 98.1 F (36.7 C) (Oral)   Resp (!) 21   SpO2 97%   Physical Exam Vitals and nursing note reviewed.  Constitutional:      General: She is not in acute distress.    Appearance: She is well-developed. She is ill-appearing. She is not toxic-appearing or diaphoretic.  HENT:     Head: Normocephalic and atraumatic.     Mouth/Throat:     Mouth: Mucous membranes are moist.  Eyes:     Conjunctiva/sclera: Conjunctivae normal.     Comments: Chronic cataract and blindness to right eye  Neck:     Vascular: JVD present.  Cardiovascular:     Rate and Rhythm: Regular rhythm. Tachycardia present.     Heart sounds: No murmur heard. Pulmonary:     Effort: Tachypnea, accessory muscle usage and respiratory distress present.     Breath sounds: Examination of the right-middle field reveals rales. Examination of the left-middle field reveals rales. Examination of the right-lower field reveals rales. Examination of the left-lower field reveals rales. Decreased breath sounds and rales present. No wheezing.  Chest:     Chest wall: No tenderness.  Abdominal:     Palpations: Abdomen is soft.     Tenderness: There is no abdominal tenderness.  Musculoskeletal:        General: No swelling. Normal range of motion.     Cervical back: Neck  supple.  Skin:    General: Skin is warm and dry.     Capillary Refill:  Capillary refill takes less than 2 seconds.     Coloration: Skin is not cyanotic or pale.  Neurological:     General: No focal deficit present.     Mental Status: She is alert and oriented to person, place, and time.     Cranial Nerves: No cranial nerve deficit.     Motor: No weakness.  Psychiatric:        Mood and Affect: Mood normal.        Behavior: Behavior normal.    ED Results / Procedures / Treatments   Labs (all labs ordered are listed, but only abnormal results are displayed) Labs Reviewed  CBC WITH DIFFERENTIAL/PLATELET - Abnormal; Notable for the following components:      Result Value   WBC 21.3 (*)    RBC 3.71 (*)    Hemoglobin 10.7 (*)    HCT 34.2 (*)    RDW 16.9 (*)    Platelets 518 (*)    Neutro Abs 16.9 (*)    Monocytes Absolute 2.0 (*)    Abs Immature Granulocytes 0.12 (*)    All other components within normal limits  D-DIMER, QUANTITATIVE - Abnormal; Notable for the following components:   D-Dimer, Quant 1.76 (*)    All other components within normal limits  BRAIN NATRIURETIC PEPTIDE - Abnormal; Notable for the following components:   B Natriuretic Peptide 3,585.7 (*)    All other components within normal limits  COMPREHENSIVE METABOLIC PANEL - Abnormal; Notable for the following components:   Sodium 133 (*)    Potassium 6.1 (*)    CO2 17 (*)    Glucose, Bld 163 (*)    BUN 83 (*)    Creatinine, Ser 13.70 (*)    Albumin 3.0 (*)    GFR, Estimated 3 (*)    Anion gap 16 (*)    All other components within normal limits  COMPREHENSIVE METABOLIC PANEL - Abnormal; Notable for the following components:   Sodium 134 (*)    Potassium 5.8 (*)    CO2 17 (*)    Glucose, Bld 177 (*)    BUN 83 (*)    Creatinine, Ser 13.61 (*)    Albumin 3.2 (*)    GFR, Estimated 3 (*)    Anion gap 18 (*)    All other components within normal limits  I-STAT CHEM 8, ED - Abnormal; Notable for the  following components:   Sodium 133 (*)    Potassium 6.6 (*)    BUN 84 (*)    Creatinine, Ser 15.40 (*)    Glucose, Bld 136 (*)    Calcium, Ion 0.96 (*)    TCO2 19 (*)    All other components within normal limits  I-STAT VENOUS BLOOD GAS, ED - Abnormal; Notable for the following components:   pCO2, Ven 31.6 (*)    Bicarbonate 19.0 (*)    TCO2 20 (*)    Acid-base deficit 5.0 (*)    Sodium 133 (*)    Potassium 6.6 (*)    Calcium, Ion 1.01 (*)    HCT 35.0 (*)    Hemoglobin 11.9 (*)    All other components within normal limits  CBG MONITORING, ED - Abnormal; Notable for the following components:   Glucose-Capillary 183 (*)    All other components within normal limits  TROPONIN I (HIGH SENSITIVITY) - Abnormal; Notable for the following components:   Troponin I (High Sensitivity) 65 (*)    All other components within normal limits  TROPONIN I (HIGH SENSITIVITY) - Abnormal; Notable for the following components:   Troponin I (High Sensitivity) 63 (*)    All other components within normal limits  RESP PANEL BY RT-PCR (FLU A&B, COVID) ARPGX2  C DIFFICILE QUICK SCREEN W PCR REFLEX    PROCALCITONIN    EKG EKG Interpretation  Date/Time:  Wednesday September 30 2021 11:11:36 EST Ventricular Rate:  106 PR Interval:  162 QRS Duration: 93 QT Interval:  339 QTC Calculation: 451 R Axis:   57 Text Interpretation: Sinus tachycardia Probable left atrial enlargement Anteroseptal infarct, old 43 Lead; [place] Confirmed by Godfrey Pick [phone]) on 09/30/2021 11:12:59 AM  Radiology DG Chest Port 1 View  Result Date: 09/30/2021 CLINICAL DATA:  Shortness of breath.  COPD. EXAM: PORTABLE CHEST 1 VIEW COMPARISON:  09/20/2021 FINDINGS: Midline trachea. Cardiomegaly accentuated by AP portable technique. Atherosclerosis in the transverse aorta. No pneumothorax. Suspect trace right pleural fluid. Interstitial edema, as evidenced by septal thickening and interstitial indistinctness. Right greater than  left base airspace disease. IMPRESSION: Cardiomegaly with mild interstitial edema. Bibasilar airspace disease. Although this could represent atelectasis, especially in the medial right lung base, concurrent infection is a concern. Possible trace right pleural fluid. Aortic Atherosclerosis (ICD10-I70.0). Electronically Signed   By: Abigail Miyamoto M.D.   On: 09/30/2021 11:14    Procedures Procedures   Medications Ordered in ED Medications  nitroGLYCERIN 50 mg in dextrose 5 % 250 mL (0.2 mg/mL) infusion (0 mcg/min Intravenous Hold 09/30/21 1319)  vancomycin (VANCOREADY) IVPB 1250 mg/250 mL (has no administration in time range)  Chlorhexidine Gluconate Cloth 2 % PADS 6 each (has no administration in time range)  ipratropium-albuterol (DUONEB) 0.5-2.5 (3) MG/3ML nebulizer solution 3 mL (3 mLs Nebulization Given 09/30/21 1113)  methylPREDNISolone sodium succinate (SOLU-MEDROL) 125 mg/2 mL injection 125 mg (125 mg Intravenous Given 09/30/21 1114)  ceFEPIme (MAXIPIME) 2 g in sodium chloride 0.9 % 100 mL IVPB (2 g Intravenous New Bag/Given 09/30/21 1419)  albuterol (PROVENTIL) (2.5 MG/3ML) 0.083% nebulizer solution 10 mg (10 mg Nebulization Given 09/30/21 1142)  insulin aspart (novoLOG) injection 5 Units (5 Units Intravenous Given 09/30/21 1152)    And  dextrose 50 % solution 50 mL (50 mLs Intravenous Given 09/30/21 1156)    ED Course  I have reviewed the triage vital signs and the nursing notes.  Pertinent labs & imaging results that were available during my care of the patient were reviewed by me and considered in my medical decision making (see chart for details).    MDM Rules/Calculators/A&P                         CRITICAL CARE Performed by: Godfrey Pick   Total critical care time: 40 minutes  Critical care time was exclusive of separately billable procedures and treating other patients.  Critical care was necessary to treat or prevent imminent or life-threatening deterioration.  Critical care  was time spent personally by me on the following activities: development of treatment plan with patient and/or surrogate as well as nursing, discussions with consultants, evaluation of patient's response to treatment, examination of patient, obtaining history from patient or surrogate, ordering and performing treatments and interventions, ordering and review of laboratory studies, ordering and review of radiographic studies, pulse oximetry and re-evaluation of patient's condition.   Patient is a 55 year old female with history of ESRD and COPD, presenting for shortness of breath.  She states that she has had shortness of breath over the past week  but this acutely worsened this morning.  EMS was called.  When they arrived, they noted labored breathing, hypoxia, and wheezing and rhonchi on lung auscultation.  Patient was given DuoNeb prior to arrival.  No other interventions were given.  Patient has increased work of breathing on arrival.  She remains on DuoNeb breathing treatment with supplemental oxygen.  On lung auscultation, patient has diffuse crackles.  Blood pressure is elevated in the range of 230/55.  On bedside ultrasound, patient has biapical B-lines and bibasilar pleural effusions.  I suspect her dyspnea is multifactorial.  I think the primary driver is fluid overload.  Additional etiologies include COPD as well as possible infection.  Patient currently complaining of chills.  She was recently hospitalized and treated for pneumonia.  She has had recent diarrhea.  Patient was started on high-dose nitro gtt.  Given her subjective improvement in breathing following DuoNeb, additional nebulized breathing treatments and Solu-Medrol were given.  Lab work shows a leukocytosis of 21.3.  This is concerning for acute infection.  Patient was treated for HCAP.  Additionally, D-dimer and BNP were elevated, raising concern for PE.  CTA of chest was ordered.  On patient's i-STAT, potassium was elevated at 6.6.   Temporizing medications (albuterol and insulin/dextrose) were given.  Nephrology was consulted.  On blood gas, patient is not hypercarbic.  On reassessment, patient endorses improved chest pain.  Blood pressure is downtrending with nitro gtt.  BiPAP was initiated to assist with work of breathing.  Patient tolerated BiPAP well.  She was able to come off of it and have minimal residual increased work of breathing.  She was taken to dialysis.  Delta troponin showed no increase.  Patient will require completion of work-up and reassessment upon her return from dialysis.  Care of patient was signed out to oncoming ED provider  Final Clinical Impression(s) / ED Diagnoses Final diagnoses:  Respiratory distress  Acute respiratory failure with hypoxia ([place])  Chest pain, unspecified type    Rx / DC Orders ED Discharge Orders     None        Godfrey Pick, MD 09/30/21 1656

## 2021-10-01 ENCOUNTER — Observation Stay (HOSPITAL_COMMUNITY): Payer: Medicare Other

## 2021-10-01 DIAGNOSIS — D631 Anemia in chronic kidney disease: Secondary | ICD-10-CM | POA: Diagnosis present

## 2021-10-01 DIAGNOSIS — N186 End stage renal disease: Secondary | ICD-10-CM | POA: Diagnosis present

## 2021-10-01 DIAGNOSIS — N2581 Secondary hyperparathyroidism of renal origin: Secondary | ICD-10-CM | POA: Diagnosis present

## 2021-10-01 DIAGNOSIS — E1122 Type 2 diabetes mellitus with diabetic chronic kidney disease: Secondary | ICD-10-CM | POA: Diagnosis present

## 2021-10-01 DIAGNOSIS — I132 Hypertensive heart and chronic kidney disease with heart failure and with stage 5 chronic kidney disease, or end stage renal disease: Secondary | ICD-10-CM | POA: Diagnosis present

## 2021-10-01 DIAGNOSIS — G4700 Insomnia, unspecified: Secondary | ICD-10-CM | POA: Diagnosis present

## 2021-10-01 DIAGNOSIS — K219 Gastro-esophageal reflux disease without esophagitis: Secondary | ICD-10-CM | POA: Diagnosis present

## 2021-10-01 DIAGNOSIS — I5043 Acute on chronic combined systolic (congestive) and diastolic (congestive) heart failure: Secondary | ICD-10-CM | POA: Diagnosis present

## 2021-10-01 DIAGNOSIS — J9601 Acute respiratory failure with hypoxia: Secondary | ICD-10-CM | POA: Diagnosis present

## 2021-10-01 DIAGNOSIS — K5909 Other constipation: Secondary | ICD-10-CM | POA: Diagnosis present

## 2021-10-01 DIAGNOSIS — E11319 Type 2 diabetes mellitus with unspecified diabetic retinopathy without macular edema: Secondary | ICD-10-CM | POA: Diagnosis present

## 2021-10-01 DIAGNOSIS — I10 Essential (primary) hypertension: Secondary | ICD-10-CM | POA: Diagnosis present

## 2021-10-01 DIAGNOSIS — J449 Chronic obstructive pulmonary disease, unspecified: Secondary | ICD-10-CM | POA: Diagnosis present

## 2021-10-01 DIAGNOSIS — E1140 Type 2 diabetes mellitus with diabetic neuropathy, unspecified: Secondary | ICD-10-CM | POA: Diagnosis present

## 2021-10-01 DIAGNOSIS — I700 Atherosclerosis of aorta: Secondary | ICD-10-CM | POA: Diagnosis present

## 2021-10-01 DIAGNOSIS — H409 Unspecified glaucoma: Secondary | ICD-10-CM | POA: Diagnosis present

## 2021-10-01 DIAGNOSIS — T82858A Stenosis of vascular prosthetic devices, implants and grafts, initial encounter: Secondary | ICD-10-CM | POA: Diagnosis not present

## 2021-10-01 DIAGNOSIS — E7800 Pure hypercholesterolemia, unspecified: Secondary | ICD-10-CM | POA: Diagnosis present

## 2021-10-01 DIAGNOSIS — E871 Hypo-osmolality and hyponatremia: Secondary | ICD-10-CM | POA: Diagnosis present

## 2021-10-01 DIAGNOSIS — F419 Anxiety disorder, unspecified: Secondary | ICD-10-CM | POA: Diagnosis present

## 2021-10-01 DIAGNOSIS — I161 Hypertensive emergency: Secondary | ICD-10-CM | POA: Diagnosis present

## 2021-10-01 DIAGNOSIS — B182 Chronic viral hepatitis C: Secondary | ICD-10-CM | POA: Diagnosis present

## 2021-10-01 DIAGNOSIS — Z20822 Contact with and (suspected) exposure to covid-19: Secondary | ICD-10-CM | POA: Diagnosis present

## 2021-10-01 DIAGNOSIS — Z992 Dependence on renal dialysis: Secondary | ICD-10-CM | POA: Diagnosis not present

## 2021-10-01 DIAGNOSIS — Y712 Prosthetic and other implants, materials and accessory cardiovascular devices associated with adverse incidents: Secondary | ICD-10-CM | POA: Diagnosis not present

## 2021-10-01 LAB — CBG MONITORING, ED
Glucose-Capillary: 201 mg/dL — ABNORMAL HIGH (ref 70–99)
Glucose-Capillary: 149 mg/dL — ABNORMAL HIGH (ref 70–99)

## 2021-10-01 LAB — COMPREHENSIVE METABOLIC PANEL
ALT: 10 U/L (ref 0–44)
AST: 13 U/L — ABNORMAL LOW (ref 15–41)
Albumin: 2.6 g/dL — ABNORMAL LOW (ref 3.5–5.0)
Alkaline Phosphatase: 59 U/L (ref 38–126)
Anion gap: 13 (ref 5–15)
BUN: 48 mg/dL — ABNORMAL HIGH (ref 6–20)
CO2: 22 mmol/L (ref 22–32)
Calcium: 8.5 mg/dL — ABNORMAL LOW (ref 8.9–10.3)
Chloride: 96 mmol/L — ABNORMAL LOW (ref 98–111)
Creatinine, Ser: 9.17 mg/dL — ABNORMAL HIGH (ref 0.44–1.00)
GFR, Estimated: 5 mL/min — ABNORMAL LOW (ref >60)
Glucose, Bld: 127 mg/dL — ABNORMAL HIGH (ref 70–99)
Potassium: 5 mmol/L (ref 3.5–5.1)
Sodium: 131 mmol/L — ABNORMAL LOW (ref 135–145)
Total Bilirubin: 0.5 mg/dL (ref 0.3–1.2)
Total Protein: 7.2 g/dL (ref 6.5–8.1)

## 2021-10-01 LAB — CBC WITH DIFFERENTIAL/PLATELET
Abs Immature Granulocytes: 0.15 10*3/uL — ABNORMAL HIGH (ref 0.00–0.07)
Basophils Absolute: 0.1 10*3/uL (ref 0.0–0.1)
Basophils Relative: 0 %
Eosinophils Absolute: 0 10*3/uL (ref 0.0–0.5)
Eosinophils Relative: 0 %
HCT: 32 % — ABNORMAL LOW (ref 36.0–46.0)
Hemoglobin: 10.2 g/dL — ABNORMAL LOW (ref 12.0–15.0)
Immature Granulocytes: 1 %
Lymphocytes Relative: 4 %
Lymphs Abs: 1 10*3/uL (ref 0.7–4.0)
MCH: 29.2 pg (ref 26.0–34.0)
MCHC: 31.9 g/dL (ref 30.0–36.0)
MCV: 91.7 fL (ref 80.0–100.0)
Monocytes Absolute: 1.2 10*3/uL — ABNORMAL HIGH (ref 0.1–1.0)
Monocytes Relative: 5 %
Neutro Abs: 19.5 10*3/uL — ABNORMAL HIGH (ref 1.7–7.7)
Neutrophils Relative %: 90 %
Platelets: 422 10*3/uL — ABNORMAL HIGH (ref 150–400)
RBC: 3.49 MIL/uL — ABNORMAL LOW (ref 3.87–5.11)
RDW: 17.1 % — ABNORMAL HIGH (ref 11.5–15.5)
WBC: 21.9 10*3/uL — ABNORMAL HIGH (ref 4.0–10.5)
nRBC: 0 % (ref 0.0–0.2)

## 2021-10-01 LAB — CBC
HCT: 34 % — ABNORMAL LOW (ref 36.0–46.0)
Hemoglobin: 10.6 g/dL — ABNORMAL LOW (ref 12.0–15.0)
MCH: 28.5 pg (ref 26.0–34.0)
MCHC: 31.2 g/dL (ref 30.0–36.0)
MCV: 91.4 fL (ref 80.0–100.0)
Platelets: 444 10*3/uL — ABNORMAL HIGH (ref 150–400)
RBC: 3.72 MIL/uL — ABNORMAL LOW (ref 3.87–5.11)
RDW: 16.7 % — ABNORMAL HIGH (ref 11.5–15.5)
WBC: 19.4 10*3/uL — ABNORMAL HIGH (ref 4.0–10.5)
nRBC: 0 % (ref 0.0–0.2)

## 2021-10-01 LAB — HEPATITIS B SURFACE ANTIBODY,QUALITATIVE: Hep B S Ab: REACTIVE — AB

## 2021-10-01 LAB — HEPATITIS B SURFACE ANTIGEN: Hepatitis B Surface Ag: NONREACTIVE

## 2021-10-01 LAB — PROCALCITONIN: Procalcitonin: 2.66 ng/mL

## 2021-10-01 LAB — URIC ACID: Uric Acid, Serum: 4.7 mg/dL (ref 2.5–7.1)

## 2021-10-01 LAB — TROPONIN I (HIGH SENSITIVITY): Troponin I (High Sensitivity): 64 ng/L — ABNORMAL HIGH (ref <18)

## 2021-10-01 LAB — GLUCOSE, CAPILLARY
Glucose-Capillary: 150 mg/dL — ABNORMAL HIGH (ref 70–99)
Glucose-Capillary: 207 mg/dL — ABNORMAL HIGH (ref 70–99)

## 2021-10-01 MED ORDER — SODIUM CHLORIDE 0.9 % IV SOLN: 100.0000 mL | INTRAVENOUS | Status: DC | PRN

## 2021-10-01 MED ORDER — LIDOCAINE-PRILOCAINE 2.5-2.5 % EX CREA: 1.0000 "application " | TOPICAL_CREAM | CUTANEOUS | Status: DC | PRN

## 2021-10-01 MED ORDER — COLCHICINE 0.3 MG HALF TABLET
0.3000 mg | ORAL_TABLET | Freq: Every day | ORAL | Status: AC
  Administered 2021-10-01 – 2021-10-02 (×2): 0.3 mg via ORAL
  Filled 2021-10-01 (×2): qty 1

## 2021-10-01 MED ORDER — LABETALOL HCL 5 MG/ML IV SOLN
10.0000 mg | Freq: Once | INTRAVENOUS | Status: AC
  Administered 2021-10-01: 10 mg via INTRAVENOUS
  Filled 2021-10-01: qty 4

## 2021-10-01 MED ORDER — ATROPINE SULFATE 1 % OP SOLN
1.0000 [drp] | Freq: Two times a day (BID) | OPHTHALMIC | Status: DC
  Administered 2021-10-01 – 2021-10-09 (×17): 1 [drp] via OPHTHALMIC
  Filled 2021-10-01: qty 2

## 2021-10-01 MED ORDER — CARVEDILOL 6.25 MG PO TABS
6.2500 mg | ORAL_TABLET | Freq: Two times a day (BID) | ORAL | Status: DC
  Administered 2021-10-01 – 2021-10-09 (×17): 6.25 mg via ORAL
  Filled 2021-10-01 (×13): qty 1
  Filled 2021-10-01: qty 2
  Filled 2021-10-01 (×3): qty 1

## 2021-10-01 MED ORDER — HYDRALAZINE HCL 50 MG PO TABS
100.0000 mg | ORAL_TABLET | Freq: Three times a day (TID) | ORAL | Status: DC
  Administered 2021-10-01 – 2021-10-05 (×13): 100 mg via ORAL
  Filled 2021-10-01 (×14): qty 2

## 2021-10-01 MED ORDER — AMLODIPINE BESYLATE 10 MG PO TABS
10.0000 mg | ORAL_TABLET | Freq: Every day | ORAL | Status: DC
  Administered 2021-10-01 – 2021-10-04 (×4): 10 mg via ORAL
  Filled 2021-10-01: qty 1
  Filled 2021-10-01: qty 2
  Filled 2021-10-01: qty 1
  Filled 2021-10-01: qty 2
  Filled 2021-10-01: qty 1

## 2021-10-01 MED ORDER — LABETALOL HCL 5 MG/ML IV SOLN
20.0000 mg | Freq: Four times a day (QID) | INTRAVENOUS | Status: DC
  Administered 2021-10-01: 20 mg via INTRAVENOUS
  Filled 2021-10-01: qty 4

## 2021-10-01 MED ORDER — LIDOCAINE HCL (PF) 1 % IJ SOLN: 5.0000 mL | INTRAMUSCULAR | Status: DC | PRN

## 2021-10-01 MED ORDER — DORZOLAMIDE HCL-TIMOLOL MAL 2-0.5 % OP SOLN
1.0000 [drp] | Freq: Two times a day (BID) | OPHTHALMIC | Status: DC
  Administered 2021-10-01 – 2021-10-09 (×17): 1 [drp] via OPHTHALMIC
  Filled 2021-10-01: qty 10

## 2021-10-01 MED ORDER — PENTAFLUOROPROP-TETRAFLUOROETH EX AERO: 1.0000 "application " | INHALATION_SPRAY | CUTANEOUS | Status: DC | PRN

## 2021-10-01 MED ORDER — IOHEXOL 350 MG/ML SOLN
69.0000 mL | Freq: Once | INTRAVENOUS | Status: AC | PRN
  Administered 2021-10-01: 69 mL via INTRAVENOUS

## 2021-10-01 MED ORDER — ALTEPLASE 2 MG IJ SOLR: 2.0000 mg | Freq: Once | INTRAMUSCULAR | Status: DC | PRN

## 2021-10-01 MED ORDER — CALCIUM ACETATE (PHOS BINDER) 667 MG PO CAPS
2001.0000 mg | ORAL_CAPSULE | Freq: Three times a day (TID) | ORAL | Status: DC
  Administered 2021-10-01 – 2021-10-09 (×23): 2001 mg via ORAL
  Filled 2021-10-01 (×25): qty 3

## 2021-10-01 MED ORDER — HEPARIN SODIUM (PORCINE) 1000 UNIT/ML DIALYSIS: 1000.0000 [IU] | INTRAMUSCULAR | Status: DC | PRN

## 2021-10-01 NOTE — ED Notes (Signed)
Report given to Nehemiah Settle, RN of [phone]

## 2021-10-01 NOTE — Progress Notes (Signed)
[facility] Progress Note    Assessment/ Plan:   # ESRD:  -outpatient orders: [facility], MWF, 2.5hrs, f180, 2k, 2.5cal, edw 52kg, aranesp 58mcg qweekly, venofer 50mg  qwkly -tolerated HD yesterday with net uf 3959cc, improved resp status. Next HD tomorrow   # Hyperkalemia -resolved with HD, recommend renal diet   # SOB, pulm edema -improved w/ HD -UF as tolerated today   # Volume/ hypertension: EDW 52kg. Attempt to achieve EDW as tolerated   # Anemia of Chronic Kidney Disease: Hemoglobin 12.2. receives aranesp and [employer] as an outpatient.    # Secondary Hyperparathyroidism/Hyperphosphatemia: resume home phoslo    # Vascular access: LUE AVF +b/t   # Additional recommendations: - Dose all meds for creatinine clearance < 10 ml/min  - Unless absolutely necessary, no MRIs with gadolinium.  - Implement save arm precautions.  Prefer needle sticks in the dorsum of the hands or wrists.  No blood pressure measurements in arm. - If blood transfusion is requested during hemodialysis sessions, please alert [place] prior to the session.  - If a hemodialysis catheter line culture is requested, please alert [place] as only hemodialysis nurses are able to collect those specimens.   Gean Quint, MD [facility]  Subjective:   No acute events. Patient reports that her breathing is better. Has some ankle pain otherwise no other complaints   Objective:   BP (!) 164/63   Pulse 80   Temp 98.1 F (36.7 C) (Oral)   Resp 17   SpO2 99%   Intake/Output Summary (Last 24 hours) at 10/01/2021 0721 Last data filed at 09/30/2021 1820 Gross per 24 hour  Intake --  Output 3959 ml  Net -3959 ml   Weight change:   Physical Exam: Gen:nad CVS:s1s2 Resp:no w/r/r/c, normal wob, speaking in full sentences HER:DEYC. Nt/nd Ext:no edema Neuro: awake, alert Dialysis access: lue avf +b/t, dressing in place  Imaging: CT Angio Chest PE W and/or Wo Contrast  Result Date:  10/01/2021 CLINICAL DATA:  Shortness of breath and chest pain EXAM: CT ANGIOGRAPHY CHEST WITH CONTRAST TECHNIQUE: Multidetector CT imaging of the chest was performed using the standard protocol during bolus administration of intravenous contrast. Multiplanar CT image reconstructions and MIPs were obtained to evaluate the vascular anatomy. CONTRAST:  72mL OMNIPAQUE IOHEXOL 350 MG/ML SOLN COMPARISON:  09/30/2021, CT from 05/15/2021 FINDINGS: Cardiovascular: Thoracic aorta and its branches demonstrate atherosclerotic calcifications. No aneurysmal dilatation or dissection is noted. Mild cardiac enlargement is seen. Scattered coronary calcifications are noted. Pulmonary artery is well visualized within normal branching pattern. No filling defect to suggest pulmonary embolism is noted. Mediastinum/Nodes: Thoracic inlet is within normal limits. No sizable hilar or mediastinal adenopathy is noted. The esophagus as visualized is within normal limits. Lungs/Pleura: Lungs are well aerated bilaterally with the exception of bilateral lower lobe atelectatic changes. Mild changes of vascular congestion and central parenchymal edema are seen. No pneumothorax is noted. Small effusions are seen bilaterally. Upper Abdomen: Visualized upper abdomen demonstrates a thrombosed calcified splenic artery aneurysm. No other focal abnormality is seen. Musculoskeletal: Undisplaced right sixth rib fracture is noted anteriorly. Degenerative changes of the thoracic spine are noted. Review of the MIP images confirms the above findings. IMPRESSION: No evidence of pulmonary emboli. Bilateral lower lobe atelectasis. Changes of mild CHF. Displaced right sixth rib fracture without complicating factors. 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 Aortic Atherosclerosis (ICD10-I70.0). Electronically Signed   By: Inez Catalina M.D.   On: 10/01/2021 02:59  DG Chest Port 1 View  Result Date: 09/30/2021 CLINICAL DATA:   Shortness of breath.  COPD. EXAM: PORTABLE CHEST 1 VIEW COMPARISON:  09/20/2021 FINDINGS: Midline trachea. Cardiomegaly accentuated by AP portable technique. Atherosclerosis in the transverse aorta. No pneumothorax. Suspect trace right pleural fluid. Interstitial edema, as evidenced by septal thickening and interstitial indistinctness. Right greater than left base airspace disease. IMPRESSION: Cardiomegaly with mild interstitial edema. Bibasilar airspace disease. Although this could represent atelectasis, especially in the medial right lung base, concurrent infection is a concern. Possible trace right pleural fluid. Aortic Atherosclerosis (ICD10-I70.0). Electronically Signed   By: Abigail Miyamoto M.D.   On: 09/30/2021 11:14    Labs: BMET Recent Labs  Lab 09/30/21 1106 09/30/21 1200 09/30/21 1316 09/30/21 2118 10/01/21 0400  NA 133*  133* 133* 134* 134* 131*  K 6.6*  6.6* 6.1* 5.8* 4.5 5.0  CL 107 100 99 97* 96*  CO2  --  17* 17* 22 22  GLUCOSE 136* 163* 177* 317* 127*  BUN 84* 83* 83* 40* 48*  CREATININE 15.40* 13.70* 13.61* 8.42* 9.17*  CALCIUM  --  8.9 9.0 8.6* 8.5*   CBC Recent Labs  Lab 09/30/21 1055 09/30/21 1106 09/30/21 2118 10/01/21 0400  WBC 21.3*  --  25.9* 21.9*  NEUTROABS 16.9*  --   --  19.5*  HGB 10.7* 12.2  11.9* 9.7* 10.2*  HCT 34.2* 36.0  35.0* 30.2* 32.0*  MCV 92.2  --  91.0 91.7  PLT 518*  --  390 422*    Medications:     amLODipine  10 mg Oral Daily   Chlorhexidine Gluconate Cloth  6 each Topical Q0600   furosemide  40 mg Oral Daily   gabapentin  600 mg Oral BID   heparin  5,000 Units Subcutaneous Q8H   hydrALAZINE  100 mg Oral Q8H   insulin aspart  0-5 Units Subcutaneous QHS   insulin aspart  0-6 Units Subcutaneous TID WC   labetalol  20 mg Intravenous Q6H   lamoTRIgine  25 mg Oral Daily   pravastatin  80 mg Oral q1800      Gean Quint, MD [facility] 10/01/2021, 7:21 AM

## 2021-10-01 NOTE — Progress Notes (Signed)
PROGRESS NOTE                                                                                                                                                                                                             Patient Demographics:    Tammy Moses, is a 55 y.o. female, DOB - 11-21-1965, HWK:[id number]  Outpatient Primary MD for the patient is [name], FNP    LOS - 0  Admit date - 09/30/2021    Chief Complaint  Patient presents with   Shortness of Breath       Brief Narrative (HPI from H&P)  55 year old African-American female with a history of end-stage renal disease needing dialysis on Monday Wednesday Friday with a history of noncompliance, type 2 diabetes, hypertension, anemia chronic disease, COPD, chronic tobacco abuse presented to the ER today via EMS with hypoxia and respiratory distress, +++ BP.   Subjective:    Tammy Moses today has, No headache, No chest pain, No abdominal pain - No Nausea, No new weakness tingling or numbness, no SOB.   Assessment  & Plan :     Acute Hypoxic Resp. Failure due to fluid overload from missed dialysis causing flash pulmonary edema, malignant hypertension - all due to noncompliance with HD regimen and blood pressure medications, was urgently dialyzed 09/30/2021 and we need another dialysis treatment on 10/02/2021.  Shortness of breath is improved but blood pressure is still quite high medications adjusted.  Monitor closely counseled on compliance.  2.  ESRD.  On MWF schedule nephrology on board.  Counseled on compliance  3.  Poorly controlled hypertension.  Noncompliant.  Counseled, medications adjusted further on 10/01/2021 for better control.  4.  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  Supportive care.  5.  COPD.  At baseline.  6.  Bilateral foot and toe pain.  Check uric acid level, trial of colchicine.  7.  Acute on chronic diastolic  CHF EF 94% on recent echo.  As in #1 above.    8.  DM type II.  On sliding scale.  Lab Results  Component Value Date   HGBA1C 5.9 (H) 09/30/2021    CBG (last 3)  Recent Labs    09/30/21 1301 09/30/21 2309 10/01/21 0802  GLUCAP 183* 246* 201*  Condition -  Guarded  Family Communication  :  none present  Code Status :  Full  Consults  :  Renal  PUD Prophylaxis :    Procedures  :     CTA - No evidence of pulmonary emboli. Bilateral lower lobe atelectasis. Changes of mild CHF. Displaced right sixth rib fracture without complicating factors. 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 Aortic Atherosclerosis      Disposition Plan  :    Status is: Observation  DVT Prophylaxis  :    heparin injection 5,000 Units Start: 09/30/21 2200 SCDs Start: 09/30/21 2139     Lab Results  Component Value Date   PLT 422 (H) 10/01/2021    Diet :  Diet Order             Diet renal/carb modified with fluid restriction Diet-HS Snack? Nothing; Fluid restriction: 1200 mL Fluid; Room service appropriate? Yes; Fluid consistency: Thin  Diet effective now                    Inpatient Medications  Scheduled Meds:  amLODipine  10 mg Oral Daily   atropine  1 drop Both Eyes BID   calcium acetate  2,001 mg Oral TID WC   carvedilol  6.25 mg Oral BID WC   Chlorhexidine Gluconate Cloth  6 each Topical Q0600   dorzolamide-timolol  1 drop Both Eyes BID   furosemide  40 mg Oral Daily   gabapentin  600 mg Oral BID   heparin  5,000 Units Subcutaneous Q8H   hydrALAZINE  100 mg Oral Q8H   insulin aspart  0-5 Units Subcutaneous QHS   insulin aspart  0-6 Units Subcutaneous TID WC   lamoTRIgine  25 mg Oral Daily   pravastatin  80 mg Oral q1800   Continuous Infusions: PRN Meds:.acetaminophen **OR** acetaminophen, albuterol, ondansetron **OR** ondansetron (ZOFRAN) IV  Antibiotics  :    Anti-infectives (From admission, onward)    Start      Dose/Rate Route Frequency Ordered Stop   09/30/21 2015  vancomycin (VANCOREADY) IVPB 1250 mg/250 mL        1,250 mg 166.7 mL/hr over 90 Minutes Intravenous  Once 09/30/21 2008 10/01/21 0122   09/30/21 1145  ceFEPIme (MAXIPIME) 2 g in sodium chloride 0.9 % 100 mL IVPB        2 g 200 mL/hr over 30 Minutes Intravenous  Once 09/30/21 1130 09/30/21 2053   09/30/21 1145  vancomycin (VANCOREADY) IVPB 1250 mg/250 mL  Status:  Discontinued        1,250 mg 166.7 mL/hr over 90 Minutes Intravenous  Once 09/30/21 1137 09/30/21 2008        Time Spent in minutes  30   Lala Lund M.D on 10/01/2021 at 9:30 AM  To page go to [email or web address]   [facility] -  Office  [phone]  See all Orders from today for further details    Objective:   Vitals:   10/01/21 0815 10/01/21 0830 10/01/21 0845 10/01/21 0900  BP: (!) 154/96 (!) 171/55 (!) 183/51 (!) 188/50  Pulse: 80 80 78 80  Resp: 15 18 14 11   Temp:      TempSrc:      SpO2: 99% 99% 99% 98%    Wt Readings from Last 3 Encounters:  09/23/21 54.4 kg  05/15/21 54.4 kg  05/05/20 59.4 kg     Intake/Output Summary (Last 24 hours) at 10/01/2021 0930 Last data filed at 09/30/2021  1820 Gross per 24 hour  Intake --  Output 3959 ml  Net -3959 ml     Physical Exam  Awake Alert, No new F.N deficits, Normal affect [place].AT, bilateral cataracts Supple Neck, No JVD,   Symmetrical Chest wall movement, Good air movement bilaterally, CTAB RRR,No Gallops,Rubs or new Murmurs,  +ve B.Sounds, Abd Soft, No tenderness,   No Cyanosis, Clubbing or edema        Data Review:    CBC Recent Labs  Lab 09/30/21 1055 09/30/21 1106 09/30/21 2118 10/01/21 0400  WBC 21.3*  --  25.9* 21.9*  HGB 10.7* 12.2  11.9* 9.7* 10.2*  HCT 34.2* 36.0  35.0* 30.2* 32.0*  PLT 518*  --  390 422*  MCV 92.2  --  91.0 91.7  MCH 28.8  --  29.2 29.2  MCHC 31.3  --  32.1 31.9  RDW 16.9*  --  16.8* 17.1*  LYMPHSABS 1.9  --   --  1.0  MONOABS 2.0*  --   --   1.2*  EOSABS 0.3  --   --  0.0  BASOSABS 0.1  --   --  0.1    Electrolytes Recent Labs  Lab 09/30/21 1055 09/30/21 1106 09/30/21 1200 09/30/21 1316 09/30/21 2118 10/01/21 0400  NA  --  133*  133* 133* 134* 134* 131*  K  --  6.6*  6.6* 6.1* 5.8* 4.5 5.0  CL  --  107 100 99 97* 96*  CO2  --   --  17* 17* 22 22  GLUCOSE  --  136* 163* 177* 317* 127*  BUN  --  84* 83* 83* 40* 48*  CREATININE  --  15.40* 13.70* 13.61* 8.42* 9.17*  CALCIUM  --   --  8.9 9.0 8.6* 8.5*  AST  --   --  19 23  --  13*  ALT  --   --  16 18  --  10  ALKPHOS  --   --  64 66  --  59  BILITOT  --   --  0.6 0.5  --  0.5  ALBUMIN  --   --  3.0* 3.2*  --  2.6*  DDIMER 1.76*  --   --   --   --   --   PROCALCITON  --   --  0.68  --   --  2.66  HGBA1C  --   --   --   --  5.9*  --   BNP 3,585.7*  --   --   --  4,037.8*  --     ------------------------------------------------------------------------------------------------------------------ No results for input(s): CHOL, HDL, LDLCALC, TRIG, CHOLHDL, LDLDIRECT in the last 72 hours.  Lab Results  Component Value Date   HGBA1C 5.9 (H) 09/30/2021    No results for input(s): TSH, T4TOTAL, T3FREE, THYROIDAB in the last 72 hours.  Invalid input(s): FREET3 ------------------------------------------------------------------------------------------------------------------ ID Labs Recent Labs  Lab 09/30/21 1055 09/30/21 1106 09/30/21 1200 09/30/21 1316 09/30/21 2118 10/01/21 0400  WBC 21.3*  --   --   --  25.9* 21.9*  PLT 518*  --   --   --  390 422*  DDIMER 1.76*  --   --   --   --   --   PROCALCITON  --   --  0.68  --   --  2.66  CREATININE  --  15.40* 13.70* 13.61* 8.42* 9.17*   Cardiac Enzymes No results for input(s): CKMB, TROPONINI, MYOGLOBIN in the  last 168 hours.  Invalid input(s): CK   Radiology Reports CT Angio Chest PE W and/or Wo Contrast  Result Date: 10/01/2021 CLINICAL DATA:  Shortness of breath and chest pain EXAM: CT ANGIOGRAPHY  CHEST WITH CONTRAST TECHNIQUE: Multidetector CT imaging of the chest was performed using the standard protocol during bolus administration of intravenous contrast. Multiplanar CT image reconstructions and MIPs were obtained to evaluate the vascular anatomy. CONTRAST:  41mL OMNIPAQUE IOHEXOL 350 MG/ML SOLN COMPARISON:  09/30/2021, CT from 05/15/2021 FINDINGS: Cardiovascular: Thoracic aorta and its branches demonstrate atherosclerotic calcifications. No aneurysmal dilatation or dissection is noted. Mild cardiac enlargement is seen. Scattered coronary calcifications are noted. Pulmonary artery is well visualized within normal branching pattern. No filling defect to suggest pulmonary embolism is noted. Mediastinum/Nodes: Thoracic inlet is within normal limits. No sizable hilar or mediastinal adenopathy is noted. The esophagus as visualized is within normal limits. Lungs/Pleura: Lungs are well aerated bilaterally with the exception of bilateral lower lobe atelectatic changes. Mild changes of vascular congestion and central parenchymal edema are seen. No pneumothorax is noted. Small effusions are seen bilaterally. Upper Abdomen: Visualized upper abdomen demonstrates a thrombosed calcified splenic artery aneurysm. No other focal abnormality is seen. Musculoskeletal: Undisplaced right sixth rib fracture is noted anteriorly. Degenerative changes of the thoracic spine are noted. Review of the MIP images confirms the above findings. IMPRESSION: No evidence of pulmonary emboli. Bilateral lower lobe atelectasis. Changes of mild CHF. Displaced right sixth rib fracture without complicating factors. 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 Aortic Atherosclerosis (ICD10-I70.0). Electronically Signed   By: Inez Catalina M.D.   On: 10/01/2021 02:59   DG Chest Port 1 View  Result Date: 10/01/2021 CLINICAL DATA:  Shortness of breath EXAM: PORTABLE CHEST 1 VIEW COMPARISON:  Chest radiograph 1 day prior  FINDINGS: The heart is enlarged, unchanged. The mediastinal contours are stable. Aeration of the right base has improved. There is no overt pulmonary edema. There is no new or worsening focal airspace disease. The small pleural effusion seen on the prior CT are not well seen on the current study. There is no enlarging pleural effusion. There is no pneumothorax. IMPRESSION: Improved aeration of the right base compared to the study from 1 day prior. No new or worsening airspace disease. Electronically Signed   By: Valetta Mole M.D.   On: 10/01/2021 08:02   DG Chest Port 1 View  Result Date: 09/30/2021 CLINICAL DATA:  Shortness of breath.  COPD. EXAM: PORTABLE CHEST 1 VIEW COMPARISON:  09/20/2021 FINDINGS: Midline trachea. Cardiomegaly accentuated by AP portable technique. Atherosclerosis in the transverse aorta. No pneumothorax. Suspect trace right pleural fluid. Interstitial edema, as evidenced by septal thickening and interstitial indistinctness. Right greater than left base airspace disease. IMPRESSION: Cardiomegaly with mild interstitial edema. Bibasilar airspace disease. Although this could represent atelectasis, especially in the medial right lung base, concurrent infection is a concern. Possible trace right pleural fluid. Aortic Atherosclerosis (ICD10-I70.0). Electronically Signed   By: Abigail Miyamoto M.D.   On: 09/30/2021 11:14

## 2021-10-01 NOTE — Progress Notes (Signed)
Pt receives out-pt HD at [facility] in [place] on MWF. Pt arrives at 11:10 for 11:25 chair time. Pt uses transportation to/from HD. Spoke to [employer], Agricultural consultant, who states clinic can provide HD tomorrow at clinic if pt stable for d/c in the am. Pt would have to arrive at clinic by 11:00. Transportation would likely prove to be a barrier since pt uses transportation services to/from [facility] in [place]. Will assist as needed.  Melven Sartorius Renal Navigator [phone]

## 2021-10-02 LAB — RENAL FUNCTION PANEL
Albumin: 2.4 g/dL — ABNORMAL LOW (ref 3.5–5.0)
Anion gap: 18 — ABNORMAL HIGH (ref 5–15)
BUN: 65 mg/dL — ABNORMAL HIGH (ref 6–20)
CO2: 23 mmol/L (ref 22–32)
Calcium: 7.9 mg/dL — ABNORMAL LOW (ref 8.9–10.3)
Chloride: 95 mmol/L — ABNORMAL LOW (ref 98–111)
Creatinine, Ser: 10.14 mg/dL — ABNORMAL HIGH (ref 0.44–1.00)
GFR, Estimated: 4 mL/min — ABNORMAL LOW (ref >60)
Glucose, Bld: 92 mg/dL (ref 70–99)
Phosphorus: 6.6 mg/dL — ABNORMAL HIGH (ref 2.5–4.6)
Potassium: 4.3 mmol/L (ref 3.5–5.1)
Sodium: 136 mmol/L (ref 135–145)

## 2021-10-02 LAB — GLUCOSE, CAPILLARY
Glucose-Capillary: 176 mg/dL — ABNORMAL HIGH (ref 70–99)
Glucose-Capillary: 134 mg/dL — ABNORMAL HIGH (ref 70–99)
Glucose-Capillary: 149 mg/dL — ABNORMAL HIGH (ref 70–99)

## 2021-10-02 LAB — PROCALCITONIN: Procalcitonin: 3.09 ng/mL

## 2021-10-02 LAB — HEPATITIS B SURFACE ANTIBODY, QUANTITATIVE: Hep B S AB Quant (Post): 123.3 m[IU]/mL (ref >9.9)

## 2021-10-02 NOTE — Progress Notes (Signed)
OT Cancellation Note  Patient Details Name: TOYA PALACIOS MRN: [id number] DOB: [birth year]   Cancelled Treatment:    Reason Eval/Treat Not Completed: Patient at procedure or test/ unavailable (Pt just left for HD.)  Malka So 10/02/2021, 9:29 AM Nestor Lewandowsky, OTR/L Acute Rehabilitation Services Pager: [phone] Office: [phone]

## 2021-10-02 NOTE — Progress Notes (Signed)
PROGRESS NOTE                                                                                                                                                                                                             Patient Demographics:    Tammy Moses, is a 55 y.o. female, DOB - [birth year], SHU:[id number]  Outpatient Primary MD for the patient is [name], FNP    LOS - 1  Admit date - 09/30/2021    Chief Complaint  Patient presents with   Shortness of Breath       Brief Narrative (HPI from H&P)  55 year old African-American female with a history of end-stage renal disease needing dialysis on Monday Wednesday Friday with a history of noncompliance, type 2 diabetes, hypertension, anemia chronic disease, COPD, chronic tobacco abuse presented to the ER today via EMS with hypoxia and respiratory distress, +++ BP.   Subjective:   Patient in bed, appears comfortable, denies any headache, no fever, no chest pain or pressure, no shortness of breath , no abdominal pain. No new focal weakness.    Assessment  & Plan :     Acute Hypoxic Resp. Failure due to fluid overload from missed dialysis causing flash pulmonary edema, malignant hypertension - all due to noncompliance with HD regimen and blood pressure medications, was urgently dialyzed 09/30/2021 and we need another dialysis treatment on 10/02/2021.  Shortness of breath is improved but blood pressure is still quite high medications adjusted.  Monitor closely counseled on compliance.  2.  ESRD.  On MWF schedule nephrology on board.  Counseled on compliance  3.  Poorly controlled hypertension.  Noncompliant.  Counseled, medications adjusted further on 10/01/2021 for better control.  4.  Legal blindness.  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  Lives alone, poor living situation, patient wants to be placed in an SNF, PT OT eval and social work to  follow  5.  COPD.  At baseline.  6.  Bilateral foot and toe pain.  Check uric acid level, trial of colchicine.  7.  Acute on chronic diastolic CHF EF 15% on recent echo.  As in #1 above.    8.  DM type II.  On sliding scale.  Lab Results  Component Value Date   HGBA1C 5.9 (H) 09/30/2021    CBG (  last 3)  Recent Labs    10/01/21 1632 10/01/21 2235 10/02/21 0752  GLUCAP 150* 207* 176*         Condition -  Guarded  Family Communication  :  none present  Code Status :  Full  Consults  :  Renal  PUD Prophylaxis :    Procedures  :     CTA - No evidence of pulmonary emboli. Bilateral lower lobe atelectasis. Changes of mild CHF. Displaced right sixth rib fracture without complicating factors. 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 Aortic Atherosclerosis      Disposition Plan  :    Status is: Observation  DVT Prophylaxis  :    heparin injection 5,000 Units Start: 09/30/21 2200 SCDs Start: 09/30/21 2139     Lab Results  Component Value Date   PLT 444 (H) 10/01/2021    Diet :  Diet Order             Diet renal/carb modified with fluid restriction Diet-HS Snack? Nothing; Fluid restriction: 1200 mL Fluid; Room service appropriate? Yes; Fluid consistency: Thin  Diet effective now                    Inpatient Medications  Scheduled Meds:  amLODipine  10 mg Oral Daily   atropine  1 drop Both Eyes BID   calcium acetate  2,001 mg Oral TID WC   carvedilol  6.25 mg Oral BID WC   colchicine  0.3 mg Oral Daily   dorzolamide-timolol  1 drop Both Eyes BID   furosemide  40 mg Oral Daily   gabapentin  600 mg Oral BID   heparin  5,000 Units Subcutaneous Q8H   hydrALAZINE  100 mg Oral Q8H   insulin aspart  0-5 Units Subcutaneous QHS   insulin aspart  0-6 Units Subcutaneous TID WC   lamoTRIgine  25 mg Oral Daily   pravastatin  80 mg Oral q1800   Continuous Infusions:  sodium chloride     sodium chloride     PRN  Meds:.sodium chloride, sodium chloride, acetaminophen **OR** acetaminophen, albuterol, alteplase, heparin, lidocaine (PF), lidocaine-prilocaine, ondansetron **OR** ondansetron (ZOFRAN) IV, pentafluoroprop-tetrafluoroeth  Antibiotics  :    Anti-infectives (From admission, onward)    Start     Dose/Rate Route Frequency Ordered Stop   09/30/21 2015  vancomycin (VANCOREADY) IVPB 1250 mg/250 mL        1,250 mg 166.7 mL/hr over 90 Minutes Intravenous  Once 09/30/21 2008 10/01/21 0122   09/30/21 1145  ceFEPIme (MAXIPIME) 2 g in sodium chloride 0.9 % 100 mL IVPB        2 g 200 mL/hr over 30 Minutes Intravenous  Once 09/30/21 1130 09/30/21 2053   09/30/21 1145  vancomycin (VANCOREADY) IVPB 1250 mg/250 mL  Status:  Discontinued        1,250 mg 166.7 mL/hr over 90 Minutes Intravenous  Once 09/30/21 1137 09/30/21 2008        Time Spent in minutes  30   Lala Lund M.D on 10/02/2021 at 10:21 AM  To page go to [email or web address]   [facility] -  Office  [phone]  See all Orders from today for further details    Objective:   Vitals:   10/02/21 0137 10/02/21 0600 10/02/21 0628 10/02/21 0930  BP: (!) 144/47  (!) 174/55 (!) 159/65  Pulse: 71  74 75  Resp: 16  18 18   Temp: 97.8 F (36.6 C)  97.7 F (  36.5 C) 98.6 F (37 C)  TempSrc: Oral  Oral Oral  SpO2: 100%  100%   Weight:  55.9 kg  59.4 kg  Height:        Wt Readings from Last 3 Encounters:  10/02/21 59.4 kg  09/23/21 54.4 kg  05/15/21 54.4 kg     Intake/Output Summary (Last 24 hours) at 10/02/2021 1021 Last data filed at 10/02/2021 0800 Gross per 24 hour  Intake 1219.91 ml  Output 150 ml  Net 1069.91 ml     Physical Exam  Awake Alert, No new F.N deficits, legally blind, with no vision in the right eye and minimal to blurry vision in the left [place].AT,PERRAL Supple Neck, No JVD,   Symmetrical Chest wall movement, Good air movement bilaterally, CTAB RRR,No Gallops, Rubs or new Murmurs,  +ve B.Sounds, Abd  Soft, No tenderness,   No Cyanosis, Clubbing or edema         Data Review:    CBC Recent Labs  Lab 09/30/21 1055 09/30/21 1106 09/30/21 2118 10/01/21 0400 10/01/21 1548  WBC 21.3*  --  25.9* 21.9* 19.4*  HGB 10.7* 12.2  11.9* 9.7* 10.2* 10.6*  HCT 34.2* 36.0  35.0* 30.2* 32.0* 34.0*  PLT 518*  --  390 422* 444*  MCV 92.2  --  91.0 91.7 91.4  MCH 28.8  --  29.2 29.2 28.5  MCHC 31.3  --  32.1 31.9 31.2  RDW 16.9*  --  16.8* 17.1* 16.7*  LYMPHSABS 1.9  --   --  1.0  --   MONOABS 2.0*  --   --  1.2*  --   EOSABS 0.3  --   --  0.0  --   BASOSABS 0.1  --   --  0.1  --     Electrolytes Recent Labs  Lab 09/30/21 1055 09/30/21 1106 09/30/21 1200 09/30/21 1316 09/30/21 2118 10/01/21 0400 10/02/21 0124 10/02/21 0153  NA  --    < > 133* 134* 134* 131*  --  136  K  --    < > 6.1* 5.8* 4.5 5.0  --  4.3  CL  --    < > 100 99 97* 96*  --  95*  CO2  --   --  17* 17* 22 22  --  23  GLUCOSE  --    < > 163* 177* 317* 127*  --  92  BUN  --    < > 83* 83* 40* 48*  --  65*  CREATININE  --    < > 13.70* 13.61* 8.42* 9.17*  --  10.14*  CALCIUM  --   --  8.9 9.0 8.6* 8.5*  --  7.9*  AST  --   --  19 23  --  13*  --   --   ALT  --   --  16 18  --  10  --   --   ALKPHOS  --   --  64 66  --  59  --   --   BILITOT  --   --  0.6 0.5  --  0.5  --   --   ALBUMIN  --   --  3.0* 3.2*  --  2.6*  --  2.4*  DDIMER 1.76*  --   --   --   --   --   --   --   PROCALCITON  --   --  0.68  --   --  2.66 3.09  --   HGBA1C  --   --   --   --  5.9*  --   --   --   BNP 3,585.7*  --   --   --  4,037.8*  --   --   --    < > = values in this interval not displayed.    ------------------------------------------------------------------------------------------------------------------ No results for input(s): CHOL, HDL, LDLCALC, TRIG, CHOLHDL, LDLDIRECT in the last 72 hours.  Lab Results  Component Value Date   HGBA1C 5.9 (H) 09/30/2021    No results for input(s): TSH, T4TOTAL, T3FREE, THYROIDAB in  the last 72 hours.  Invalid input(s): FREET3 ------------------------------------------------------------------------------------------------------------------ ID Labs Recent Labs  Lab 09/30/21 1055 09/30/21 1106 09/30/21 1200 09/30/21 1316 09/30/21 2118 10/01/21 0400 10/01/21 1548 10/02/21 0124 10/02/21 0153  WBC 21.3*  --   --   --  25.9* 21.9* 19.4*  --   --   PLT 518*  --   --   --  390 422* 444*  --   --   DDIMER 1.76*  --   --   --   --   --   --   --   --   PROCALCITON  --   --  0.68  --   --  2.66  --  3.09  --   CREATININE  --    < > 13.70* 13.61* 8.42* 9.17*  --   --  10.14*   < > = values in this interval not displayed.   Cardiac Enzymes No results for input(s): CKMB, TROPONINI, MYOGLOBIN in the last 168 hours.  Invalid input(s): CK   Radiology Reports CT Angio Chest PE W and/or Wo Contrast  Result Date: 10/01/2021 CLINICAL DATA:  Shortness of breath and chest pain EXAM: CT ANGIOGRAPHY CHEST WITH CONTRAST TECHNIQUE: Multidetector CT imaging of the chest was performed using the standard protocol during bolus administration of intravenous contrast. Multiplanar CT image reconstructions and MIPs were obtained to evaluate the vascular anatomy. CONTRAST:  4mL OMNIPAQUE IOHEXOL 350 MG/ML SOLN COMPARISON:  09/30/2021, CT from 05/15/2021 FINDINGS: Cardiovascular: Thoracic aorta and its branches demonstrate atherosclerotic calcifications. No aneurysmal dilatation or dissection is noted. Mild cardiac enlargement is seen. Scattered coronary calcifications are noted. Pulmonary artery is well visualized within normal branching pattern. No filling defect to suggest pulmonary embolism is noted. Mediastinum/Nodes: Thoracic inlet is within normal limits. No sizable hilar or mediastinal adenopathy is noted. The esophagus as visualized is within normal limits. Lungs/Pleura: Lungs are well aerated bilaterally with the exception of bilateral lower lobe atelectatic changes. Mild changes of  vascular congestion and central parenchymal edema are seen. No pneumothorax is noted. Small effusions are seen bilaterally. Upper Abdomen: Visualized upper abdomen demonstrates a thrombosed calcified splenic artery aneurysm. No other focal abnormality is seen. Musculoskeletal: Undisplaced right sixth rib fracture is noted anteriorly. Degenerative changes of the thoracic spine are noted. Review of the MIP images confirms the above findings. IMPRESSION: No evidence of pulmonary emboli. Bilateral lower lobe atelectasis. Changes of mild CHF. Displaced right sixth rib fracture without complicating factors. 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 Aortic Atherosclerosis (ICD10-I70.0). Electronically Signed   By: Inez Catalina M.D.   On: 10/01/2021 02:59   DG Chest Port 1 View  Result Date: 10/01/2021 CLINICAL DATA:  Shortness of breath EXAM: PORTABLE CHEST 1 VIEW COMPARISON:  Chest radiograph 1 day prior FINDINGS: The heart is enlarged, unchanged. The mediastinal contours are stable. Aeration of the  right base has improved. There is no overt pulmonary edema. There is no new or worsening focal airspace disease. The small pleural effusion seen on the prior CT are not well seen on the current study. There is no enlarging pleural effusion. There is no pneumothorax. IMPRESSION: Improved aeration of the right base compared to the study from 1 day prior. No new or worsening airspace disease. Electronically Signed   By: Valetta Mole M.D.   On: 10/01/2021 08:02   DG Chest Port 1 View  Result Date: 09/30/2021 CLINICAL DATA:  Shortness of breath.  COPD. EXAM: PORTABLE CHEST 1 VIEW COMPARISON:  09/20/2021 FINDINGS: Midline trachea. Cardiomegaly accentuated by AP portable technique. Atherosclerosis in the transverse aorta. No pneumothorax. Suspect trace right pleural fluid. Interstitial edema, as evidenced by septal thickening and interstitial indistinctness. Right greater than left base  airspace disease. IMPRESSION: Cardiomegaly with mild interstitial edema. Bibasilar airspace disease. Although this could represent atelectasis, especially in the medial right lung base, concurrent infection is a concern. Possible trace right pleural fluid. Aortic Atherosclerosis (ICD10-I70.0). Electronically Signed   By: Abigail Miyamoto M.D.   On: 09/30/2021 11:14

## 2021-10-02 NOTE — Progress Notes (Signed)
Pt's chart reviewed this am. Noted pt's desire for snf placement per MD note. Pt receives out-pt HD at [facility] in [facility]. Pt's schedule is MWF with 11:10 arrival and 11:25 chair time. SNF will need to be able to transport pt to out-pt clinic or clinic will need to be changed. Will assist as needed.  Melven Sartorius Renal Navigator [phone]

## 2021-10-02 NOTE — Evaluation (Signed)
Physical Therapy Evaluation Patient Details Name: Tammy Moses MRN: [id number] DOB: [birth year] Today's Date: 10/02/2021  History of Present Illness  55 year old African-American female with a history of end-stage renal disease needing dialysis on Monday Wednesday Friday with a history of noncompliance, type 2 diabetes, hypertension, anemia chronic disease, COPD, R eye blindness, diabetic retinopathy, neuropathy, chronic tobacco abuse presented to the ER today via EMS with hypoxia and respiratory distress, +++ BP.  Clinical Impression  Patient presents with decreased mobility due to decreased safety awareness, decreased balance and decreased vision.  She needs min A and cues to safely navigate the room and short hallway ambulation.  She reports being unable to give herself eye drops and aide she has comes super early and her help seems ineffective to get Tammy Moses out the door for dialysis.  She is hopeful to set up alternate situation with her family assisting, but currently needs STSNF for education on compensation for visual deficits and for safety awareness.  PT will continue to follow acutely.        Recommendations for follow up therapy are one component of a multi-disciplinary discharge planning process, led by the attending physician.  Recommendations may be updated based on patient status, additional functional criteria and insurance authorization.  Follow Up Recommendations Skilled nursing-short term rehab (<3 hours/day)    Assistance Recommended at Discharge Intermittent Supervision/Assistance  Functional Status Assessment Patient has had a recent decline in their functional status and demonstrates the ability to make significant improvements in function in a reasonable and predictable amount of time.  Equipment Recommendations  None recommended by PT    Recommendations for Other Services       Precautions / Restrictions Precautions Precautions: Fall;Other (comment) Precaution  Comments: blind, Rt foot drop      Mobility  Bed Mobility         Supine to sit: Modified independent (Device/Increase time) Sit to supine: Modified independent (Device/Increase time)        Transfers Overall transfer level: Needs assistance Equipment used: Rolling walker (2 wheels) Transfers: Sit to/from Stand Sit to Stand: Min guard           General transfer comment: assist for balance    Ambulation/Gait Ambulation/Gait assistance: Min assist Gait Distance (Feet): 70 Feet Assistive device: Rolling walker (2 wheels) Gait Pattern/deviations: Step-through pattern;Steppage;Decreased dorsiflexion - right;Decreased stride length       General Gait Details: lots of cues needed to maneuver around obstacles in the room and through doorway, pt reports only seeing shadows  Stairs            Wheelchair Mobility    Modified Rankin (Stroke Patients Only)       Balance Overall balance assessment: Needs assistance   Sitting balance-Leahy Scale: Good       Standing balance-Leahy Scale: Poor Standing balance comment: standing at sink to bush teeth minguard A and 1 UE support                             Pertinent Vitals/Pain Pain Assessment: No/denies pain    Home Living Family/patient expects to be discharged to:: Skilled nursing facility Living Arrangements: Alone Available Help at Discharge: Personal care attendant Type of Home: Apartment Home Access: Level entry       Home Layout: One level Home Equipment: Rollator (4 wheels);Kasandra Knudsen - single point      Prior Function  Mobility Comments: history of 5 falls in 6 months ADLs Comments: has PCA 2-3 hours/day and comes 3 AM MWF     Hand Dominance   Dominant Hand: Right    Extremity/Trunk Assessment   Upper Extremity Assessment Upper Extremity Assessment: Defer to OT evaluation    Lower Extremity Assessment Lower Extremity Assessment: RLE deficits/detail;LLE  deficits/detail RLE Deficits / Details: 0/5 dorsiflexion, otherwise 4/5 RLE Sensation: decreased light touch;history of peripheral neuropathy LLE Deficits / Details: decreased AROM ankle DF    Cervical / Trunk Assessment Cervical / Trunk Assessment: Normal  Communication   Communication: No difficulties  Cognition Arousal/Alertness: Awake/alert Behavior During Therapy: WFL for tasks assessed/performed Overall Cognitive Status: No family/caregiver present to determine baseline cognitive functioning                                 General Comments: decreased safety awareness        General Comments General comments (skin integrity, edema, etc.): Discussed options for d/c and pt eager for ST rehab and hopeful to get set up with her "daughter in law" to help her if she will agree when she does go home.    Exercises     Assessment/Plan    PT Assessment Patient needs continued PT services  PT Problem List Impaired sensation;Decreased mobility;Decreased safety awareness;Decreased balance       PT Treatment Interventions Therapeutic activities;Gait training;Therapeutic exercise;Patient/family education;Functional mobility training;Balance training;Other (comment) (compensatory technique)    PT Goals (Current goals can be found in the Care Plan section)  Acute Rehab PT Goals Patient Stated Goal: agreeable to rehab PT Goal Formulation: With patient Time For Goal Achievement: 10/16/21 Potential to Achieve Goals: Fair    Frequency Min 3X/week   Barriers to discharge Decreased caregiver support      Co-evaluation               AM-PAC PT "6 Clicks" Mobility  Outcome Measure Help needed turning from your back to your side while in a flat bed without using bedrails?: None Help needed moving from lying on your back to sitting on the side of a flat bed without using bedrails?: None Help needed moving to and from a bed to a chair (including a wheelchair)?: A  Little Help needed standing up from a chair using your arms (e.g., wheelchair or bedside chair)?: A Little Help needed to walk in hospital room?: A Little Help needed climbing 3-5 steps with a railing? : Total 6 Click Score: 18    End of Session Equipment Utilized During Treatment: Gait belt Activity Tolerance: Patient tolerated treatment well Patient left: in bed;with call bell/phone within reach;with bed alarm set   PT Visit Diagnosis: Difficulty in walking, not elsewhere classified (R26.2);Other symptoms and signs involving the nervous system (R29.898)    Time: 1771-1657 PT Time Calculation (min) (ACUTE ONLY): 25 min   Charges:   PT Evaluation $PT Eval Moderate Complexity: 1 Mod PT Treatments $Gait Training: 8-22 mins        Magda Kiel, PT Acute Rehabilitation Services [phone] Office:[phone] 10/02/2021   Reginia Naas 10/02/2021, 5:08 PM

## 2021-10-02 NOTE — Consult Note (Signed)
   [facility] Inpatient Consult   10/02/2021  Tammy Moses [birth year] [id number]  [facility] [ACO] Patient:  Primary Care Provider:  [name], FNP   Patient is currently pending with [facility] for chronic disease management services.   Plan: Follow for post hospital needs. Patient was in procedure.  Will follow for post hospital TOC recommendations.   Of note, [facility] Care Management services does not replace or interfere with any services that are needed or arranged by inpatient [facility] care management team.  For additional questions or referrals please contact:  Natividad Brood, RN BSN [facility] Liaison  [phone] business mobile phone Toll free office [phone]  Fax number: [phone] [email or web address]

## 2021-10-02 NOTE — Progress Notes (Signed)
PT Cancellation Note  Patient Details Name: Tammy Moses MRN: [id number] DOB: [birth year]   Cancelled Treatment:    Reason Eval/Treat Not Completed: Patient at procedure or test/unavailable; attempted to see pt, but out of the room at HD.  Will return as schedule permits.    Reginia Naas 10/02/2021, 9:41 AM Magda Kiel, PT Acute Rehabilitation Services YIFOY:[phone] Office:[phone] 10/02/2021

## 2021-10-02 NOTE — Progress Notes (Addendum)
[facility] NEPHROLOGY PROGRESS NOTE  Assessment/ Plan:  OP HD orders: [facility], MWF, 2.5hrs, f180, 2k, 2.5cal, edw 52kg, aranesp 70mcg qweekly, venofer 50mg  qwkly  # Hyperkalemia: Improved with dialysis.  Continue low potassium diet.  #Acute pulmonary edema/shortness of breath: Improved with HD and ultrafiltration.  # ESRD: MWF at [facility].  Receiving dialysis today.  UF as tolerated.  Clinically looks euvolemic.  # Anemia of ESRD: Hemoglobin at goal.  ESA with HD.  # Secondary hyperparathyroidism: Continue PhosLo for hyperphosphatemia.  Monitor lab.  # HTN/volume: Blood pressure acceptable, volume managed with dialysis.  #Dispo: It seems like patient has poor living situation therefore wants to be placed in SNF.  PT, OT and social worker evaluation ongoing.  Subjective: Seen and examined in dialysis unit.  Denies nausea, vomiting, chest pain, shortness of breath.  Tolerating HD well. Objective Vital signs in last 24 hours: Vitals:   10/02/21 0930 10/02/21 1000 10/02/21 1030 10/02/21 1100  BP: (!) 159/65 (!) 150/61 (!) 101/55 (!) 131/55  Pulse: 75 74 73 74  Resp: 18 15 17 10   Temp: 98.6 F (37 C)     TempSrc: Oral     SpO2:      Weight: 59.4 kg     Height:       Weight change:   Intake/Output Summary (Last 24 hours) at 10/02/2021 1111 Last data filed at 10/02/2021 0800 Gross per 24 hour  Intake 1219.91 ml  Output 150 ml  Net 1069.91 ml       Labs: Basic Metabolic Panel: Recent Labs  Lab 09/30/21 2118 10/01/21 0400 10/02/21 0153  NA 134* 131* 136  K 4.5 5.0 4.3  CL 97* 96* 95*  CO2 22 22 23   GLUCOSE 317* 127* 92  BUN 40* 48* 65*  CREATININE 8.42* 9.17* 10.14*  CALCIUM 8.6* 8.5* 7.9*  PHOS  --   --  6.6*   Liver Function Tests: Recent Labs  Lab 09/30/21 1200 09/30/21 1316 10/01/21 0400 10/02/21 0153  AST 19 23 13*  --   ALT 16 18 10   --   ALKPHOS 64 66 59  --   BILITOT 0.6 0.5 0.5  --   PROT 7.6 7.9 7.2  --   ALBUMIN 3.0*  3.2* 2.6* 2.4*   No results for input(s): LIPASE, AMYLASE in the last 168 hours. No results for input(s): AMMONIA in the last 168 hours. CBC: Recent Labs  Lab 09/30/21 1055 09/30/21 1106 09/30/21 2118 10/01/21 0400 10/01/21 1548  WBC 21.3*  --  25.9* 21.9* 19.4*  NEUTROABS 16.9*  --   --  19.5*  --   HGB 10.7*   < > 9.7* 10.2* 10.6*  HCT 34.2*   < > 30.2* 32.0* 34.0*  MCV 92.2  --  91.0 91.7 91.4  PLT 518*  --  390 422* 444*   < > = values in this interval not displayed.   Cardiac Enzymes: No results for input(s): CKTOTAL, CKMB, CKMBINDEX, TROPONINI in the last 168 hours. CBG: Recent Labs  Lab 10/01/21 0802 10/01/21 1228 10/01/21 1632 10/01/21 2235 10/02/21 0752  GLUCAP 201* 149* 150* 207* 176*    Iron Studies: No results for input(s): IRON, TIBC, TRANSFERRIN, FERRITIN in the last 72 hours. Studies/Results: CT Angio Chest PE W and/or Wo Contrast  Result Date: 10/01/2021 CLINICAL DATA:  Shortness of breath and chest pain EXAM: CT ANGIOGRAPHY CHEST WITH CONTRAST TECHNIQUE: Multidetector CT imaging of the chest was performed using the standard protocol during bolus administration  of intravenous contrast. Multiplanar CT image reconstructions and MIPs were obtained to evaluate the vascular anatomy. CONTRAST:  59mL OMNIPAQUE IOHEXOL 350 MG/ML SOLN COMPARISON:  09/30/2021, CT from 05/15/2021 FINDINGS: Cardiovascular: Thoracic aorta and its branches demonstrate atherosclerotic calcifications. No aneurysmal dilatation or dissection is noted. Mild cardiac enlargement is seen. Scattered coronary calcifications are noted. Pulmonary artery is well visualized within normal branching pattern. No filling defect to suggest pulmonary embolism is noted. Mediastinum/Nodes: Thoracic inlet is within normal limits. No sizable hilar or mediastinal adenopathy is noted. The esophagus as visualized is within normal limits. Lungs/Pleura: Lungs are well aerated bilaterally with the exception of bilateral  lower lobe atelectatic changes. Mild changes of vascular congestion and central parenchymal edema are seen. No pneumothorax is noted. Small effusions are seen bilaterally. Upper Abdomen: Visualized upper abdomen demonstrates a thrombosed calcified splenic artery aneurysm. No other focal abnormality is seen. Musculoskeletal: Undisplaced right sixth rib fracture is noted anteriorly. Degenerative changes of the thoracic spine are noted. Review of the MIP images confirms the above findings. IMPRESSION: No evidence of pulmonary emboli. Bilateral lower lobe atelectasis. Changes of mild CHF. Displaced right sixth rib fracture without complicating factors. 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 Aortic Atherosclerosis (ICD10-I70.0). Electronically Signed   By: Inez Catalina M.D.   On: 10/01/2021 02:59   DG Chest Port 1 View  Result Date: 10/01/2021 CLINICAL DATA:  Shortness of breath EXAM: PORTABLE CHEST 1 VIEW COMPARISON:  Chest radiograph 1 day prior FINDINGS: The heart is enlarged, unchanged. The mediastinal contours are stable. Aeration of the right base has improved. There is no overt pulmonary edema. There is no new or worsening focal airspace disease. The small pleural effusion seen on the prior CT are not well seen on the current study. There is no enlarging pleural effusion. There is no pneumothorax. IMPRESSION: Improved aeration of the right base compared to the study from 1 day prior. No new or worsening airspace disease. Electronically Signed   By: Valetta Mole M.D.   On: 10/01/2021 08:02    Medications: Infusions:  sodium chloride     sodium chloride      Scheduled Medications:  amLODipine  10 mg Oral Daily   atropine  1 drop Both Eyes BID   calcium acetate  2,001 mg Oral TID WC   carvedilol  6.25 mg Oral BID WC   colchicine  0.3 mg Oral Daily   dorzolamide-timolol  1 drop Both Eyes BID   furosemide  40 mg Oral Daily   gabapentin  600 mg Oral BID   heparin   5,000 Units Subcutaneous Q8H   hydrALAZINE  100 mg Oral Q8H   insulin aspart  0-5 Units Subcutaneous QHS   insulin aspart  0-6 Units Subcutaneous TID WC   lamoTRIgine  25 mg Oral Daily   pravastatin  80 mg Oral q1800    have reviewed scheduled and prn medications.  Physical Exam: General:NAD, comfortable Heart:RRR, s1s2 nl Lungs:clear b/l, no crackle Abdomen:soft, Non-tender, non-distended Extremities:No edema Dialysis Access: Left upper extremity AV fistula has good bruit and thrill.  Gillian Kluever Prasad Rush Salce 10/02/2021,11:11 AM  LOS: 1 day

## 2021-10-03 ENCOUNTER — Encounter (HOSPITAL_COMMUNITY): Payer: Self-pay | Admitting: Internal Medicine

## 2021-10-03 LAB — GLUCOSE, CAPILLARY
Glucose-Capillary: 195 mg/dL — ABNORMAL HIGH (ref 70–99)
Glucose-Capillary: 119 mg/dL — ABNORMAL HIGH (ref 70–99)
Glucose-Capillary: 149 mg/dL — ABNORMAL HIGH (ref 70–99)
Glucose-Capillary: 190 mg/dL — ABNORMAL HIGH (ref 70–99)

## 2021-10-03 NOTE — Progress Notes (Signed)
[facility] NEPHROLOGY PROGRESS NOTE  Assessment/ Plan:  OP HD orders: [facility], MWF, 2.5hrs, f180, 2k, 2.5cal, edw 52kg, aranesp 66mcg qweekly, venofer 50mg  qwkly  # Hyperkalemia: Improved with dialysis.  Continue low potassium diet.  #Acute pulmonary edema/shortness of breath: Improved with HD and ultrafiltration.  # ESRD: MWF at [facility].  Status post HD on 12/9 with 3 L UF, plan for next HD on 12/12. UF as tolerated.  Clinically looks euvolemic.  # Anemia of ESRD: Hemoglobin at goal.  ESA with HD.  # Secondary hyperparathyroidism: Continue PhosLo for hyperphosphatemia.  Monitor lab.  # HTN/volume: Blood pressure acceptable, volume managed with dialysis.  #Dispo: It seems like patient has poor living situation therefore wants to be placed in SNF.  PT, OT and social worker evaluation ongoing.  Discussed with the primary team.  Subjective: Seen and examined.  No new event.  Denies nausea, vomiting, chest pain, shortness breath.  Objective Vital signs in last 24 hours: Vitals:   10/02/21 1340 10/02/21 1345 10/03/21 0423 10/03/21 0620  BP: (!) 190/58 (!) 168/67  (!) 159/47  Pulse:    70  Resp: 20 20  18   Temp: 97.7 F (36.5 C) 97.7 F (36.5 C)  97.8 F (36.6 C)  TempSrc:    Oral  SpO2:    99%  Weight:  56.6 kg 54.3 kg   Height:       Weight change: 12 kg  Intake/Output Summary (Last 24 hours) at 10/03/2021 0943 Last data filed at 10/02/2021 1442 Gross per 24 hour  Intake --  Output 3075 ml  Net -3075 ml        Labs: Basic Metabolic Panel: Recent Labs  Lab 09/30/21 2118 10/01/21 0400 10/02/21 0153  NA 134* 131* 136  K 4.5 5.0 4.3  CL 97* 96* 95*  CO2 22 22 23   GLUCOSE 317* 127* 92  BUN 40* 48* 65*  CREATININE 8.42* 9.17* 10.14*  CALCIUM 8.6* 8.5* 7.9*  PHOS  --   --  6.6*    Liver Function Tests: Recent Labs  Lab 09/30/21 1200 09/30/21 1316 10/01/21 0400 10/02/21 0153  AST 19 23 13*  --   ALT 16 18 10   --   ALKPHOS 64 66  59  --   BILITOT 0.6 0.5 0.5  --   PROT 7.6 7.9 7.2  --   ALBUMIN 3.0* 3.2* 2.6* 2.4*    No results for input(s): LIPASE, AMYLASE in the last 168 hours. No results for input(s): AMMONIA in the last 168 hours. CBC: Recent Labs  Lab 09/30/21 1055 09/30/21 1106 09/30/21 2118 10/01/21 0400 10/01/21 1548  WBC 21.3*  --  25.9* 21.9* 19.4*  NEUTROABS 16.9*  --   --  19.5*  --   HGB 10.7*   < > 9.7* 10.2* 10.6*  HCT 34.2*   < > 30.2* 32.0* 34.0*  MCV 92.2  --  91.0 91.7 91.4  PLT 518*  --  390 422* 444*   < > = values in this interval not displayed.    Cardiac Enzymes: No results for input(s): CKTOTAL, CKMB, CKMBINDEX, TROPONINI in the last 168 hours. CBG: Recent Labs  Lab 10/01/21 2235 10/02/21 0752 10/02/21 1508 10/02/21 2242 10/03/21 0746  GLUCAP 207* 176* 134* 149* 195*     Iron Studies: No results for input(s): IRON, TIBC, TRANSFERRIN, FERRITIN in the last 72 hours. Studies/Results: No results found.  Medications: Infusions:    Scheduled Medications:  amLODipine  10 mg Oral Daily  atropine  1 drop Both Eyes BID   calcium acetate  2,001 mg Oral TID WC   carvedilol  6.25 mg Oral BID WC   dorzolamide-timolol  1 drop Both Eyes BID   furosemide  40 mg Oral Daily   gabapentin  600 mg Oral BID   heparin  5,000 Units Subcutaneous Q8H   hydrALAZINE  100 mg Oral Q8H   insulin aspart  0-5 Units Subcutaneous QHS   insulin aspart  0-6 Units Subcutaneous TID WC   lamoTRIgine  25 mg Oral Daily   pravastatin  80 mg Oral q1800    have reviewed scheduled and prn medications.  Physical Exam: General: Looks comfortable, able to lie flat Heart:RRR, s1s2 nl Lungs: Clear b/l, no crackle Abdomen:soft, Non-tender, non-distended Extremities:No edema Dialysis Access: Left upper extremity AV fistula has good bruit and thrill.  Tammy Moses Tammy Moses 10/03/2021,9:43 AM  LOS: 2 days

## 2021-10-03 NOTE — TOC Initial Note (Signed)
Transition of Care (TOC) - Initial/Assessment Note    Patient Details  Name: Tammy Moses MRN: [id number] Date of Birth: 02/23/1966  Transition of Care (TOC) CM/SW Contact:     E , LCSW Phone Number:[phone] 10/03/2021, 4:20 PM  Clinical Narrative:                   CSW met with patient to discuss PT recommendation of a SNF. Patient was aware of recommendation and in agreement with going to a ST SNF. CSW discussed the SNF process.CSW provided patient with medicare.gov rating list.  Patient gave CSW permission to fax referrals out to local facilities.CSW answered questions about the SNF process and the next steps in the process.  CSW discussed with pt about possibly needing to change HD facility due to fewer facilities having the capacity to transport to [place] and pt was in agreement. Pt actually informed CSW that she would prefer a change  to a facility in [place]. CSW explained that it would be best to see which bed offers comes back and if facility could accommodate current facility before changing.  Pt asked CSW about funding to assist niece with getting a bigger home so that pt could come stay with her. CSW provided pt with some follow up agencies such as DSS and [facility].  Pt has not been to a facility in the past.  TOC team will continue to assist with discharge planning needs.       Expected Discharge Plan: Skilled Nursing Facility Barriers to Discharge: Insurance Authorization, Continued Medical Work up, SNF Pending bed offer   Patient Goals and CMS Choice   CMS Medicare.gov Compare Post Acute Care list provided to:: Patient (will need assistance with reading it) Choice offered to / list presented to : Patient  Expected Discharge Plan and Services Expected Discharge Plan: Skilled Nursing Facility       Living arrangements for the past 2 months: Apartment                                      Prior Living  Arrangements/Services Living arrangements for the past 2 months: Apartment Lives with:: Self   Do you feel safe going back to the place where you live?: Yes               Activities of Daily Living Home Assistive Devices/Equipment: Cane (specify quad or straight) ADL Screening (condition at time of admission) Patient's cognitive ability adequate to safely complete daily activities?: Yes Is the patient deaf or have difficulty hearing?: No Does the patient have difficulty seeing, even when wearing glasses/contacts?: Yes Does the patient have difficulty concentrating, remembering, or making decisions?: No Patient able to express need for assistance with ADLs?: Yes Does the patient have difficulty dressing or bathing?: Yes Independently performs ADLs?: No Communication: Independent Dressing (OT): Needs assistance Is this a change from baseline?: Pre-admission baseline Grooming: Needs assistance Is this a change from baseline?: Pre-admission baseline Feeding: Needs assistance (needs assist setting up) Is this a change from baseline?: Pre-admission baseline Bathing: Needs assistance Is this a change from baseline?: Pre-admission baseline Toileting: Needs assistance Is this a change from baseline?: Pre-admission baseline In/Out Bed: Needs assistance Is this a change from baseline?: Pre-admission baseline Walks in Home: Needs assistance Is this a change from baseline?: Pre-admission baseline Does the patient have difficulty walking or climbing stairs?: Yes Weakness of Legs:   None Weakness of Arms/Hands: None  Permission Sought/Granted Permission sought to share information with : Family Supports Permission granted to share information with : Yes, Release of Information Signed  Share Information with NAME: Enid Derry  Permission granted to share info w AGENCY: SNF  Permission granted to share info w Relationship: friend  Permission granted to share info w Contact Information: [phone]  Emotional Assessment Appearance:: Appears older than stated age Attitude/Demeanor/Rapport: Engaged Affect (typically observed): Accepting, Adaptable Orientation: : Oriented to Self, Oriented to Place, Oriented to  Time, Oriented to Situation      Admission diagnosis:  Malignant hypertension [I10] Respiratory distress [R06.03] SOB (shortness of breath) [R06.02] Acute respiratory failure with hypoxia (HCC) [J96.01] Hypertensive emergency [I16.1] Chest pain, unspecified type [R07.9] Patient Active Problem List   Diagnosis Date Noted   Malignant hypertension 09/30/2021   Flash pulmonary edema ([place]) 09/30/2021   Non compliance with medical treatment 09/30/2021   Foot pain, right 09/22/2021   Elevated troponin 09/16/2021   Anemia due to chronic kidney disease 09/16/2021   Impaired functional mobility and activity tolerance 01/04/2019   Pneumonia of both lungs due to infectious organism 10/05/2017   Fall    Anemia 07/12/2017   COPD (chronic obstructive pulmonary disease) ([place]) 06/24/2017   ESRD (end stage renal disease) ([place]) -  Dialyzes at [facility].  On Monday Wednesday Friday. 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[place]) 05/10/2017   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   Vitamin D deficiency 04/07/2017   Absolute glaucoma of right eye 12/27/2016   Chronic angle-closure glaucoma of left eye, severe stage 12/27/2016   PCO (posterior capsule opacification), left 12/27/2016   Gait abnormality 03/07/2014   Abnormality of gait 03/07/2014   Hepatitis C virus infection without hepatic coma 03/04/2014   History of hepatitis C 03/04/2014   Erosive gastritis 02/14/2014   Pseudophakia of left eye 10/30/2012   Asthma 10/13/2012   Reflux  10/13/2012   Lens replaced by other means 10/12/2012   Primary open angle glaucoma 10/12/2012   BACK PAIN, LUMBAR 08/18/2010   IDDM 08/11/2010   HYPERCHOLESTEROLEMIA 08/11/2010   HYPOKALEMIA 08/11/2010   DEPRESSION 08/11/2010   GLAUCOMA 08/11/2010   BLINDNESS, RIGHT EYE 08/11/2010   Essential hypertension 08/11/2010   GERD 08/11/2010   CONSTIPATION 08/06/2009   PCP:  [name], FNP Pharmacy:   [place], [place] - [address] [place] [address] Phone: [phone] Fax: [phone]  [facility] [phone] - [place], [place] - [address] [place] [address] Phone: [phone] Fax: [phone]  [facility] [address] [place] [address] Phone: [phone] Fax: [phone]     Social Determinants of Health (SDOH) Interventions    Readmission Risk Interventions No flowsheet data found.

## 2021-10-03 NOTE — Progress Notes (Signed)
PROGRESS NOTE                                                                                                                                                                                                             Patient Demographics:    Tammy Moses, is a 55 y.o. female, DOB - [birth year], HWT:[id number]  Outpatient Primary MD for the patient is Tammy Glatter, FNP    LOS - 2  Admit date - 09/30/2021    Chief Complaint  Patient presents with   Shortness of Breath       Brief Narrative (HPI from H&P)  55 year old African-American female with a history of end-stage renal disease needing dialysis on Monday Wednesday Friday with a history of noncompliance, type 2 diabetes, hypertension, anemia chronic disease, COPD, chronic tobacco abuse presented to the ER today via EMS with hypoxia and respiratory distress, +++ BP.   Subjective:   Patient in bed, appears comfortable, denies any headache, no fever, no chest pain or pressure, no shortness of breath , no abdominal pain. No new focal weakness.   Assessment  & Plan :     Acute Hypoxic Resp. Failure due to fluid overload from missed dialysis causing flash pulmonary edema, malignant hypertension - all due to noncompliance with HD regimen and blood pressure medications, was urgently dialyzed 09/30/2021 and we need another dialysis treatment on 10/02/2021.  Shortness of breath is improved but blood pressure is still quite high medications adjusted.  Monitor closely counseled on compliance.  2.  ESRD.  On MWF schedule nephrology on board.  Counseled on compliance  3.  Poorly controlled hypertension.  Noncompliant.  Counseled, medications adjusted further on 10/01/2021 for better control.  4.  Legal blindness.  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  Lives alone, poor living situation, seen by PT OT qualifies for SNF, have requested social work to look for SNF  bed.  5.  COPD.  At baseline.  6.  Bilateral foot and toe pain.  Check uric acid level, trial of colchicine.  7.  Acute on chronic diastolic CHF EF 91% on recent echo.  As in #1 above.    8.  DM type II.  On sliding scale.  Lab Results  Component Value Date   HGBA1C 5.9 (H) 09/30/2021    CBG (last  3)  Recent Labs    10/02/21 1508 10/02/21 2242 10/03/21 0746  GLUCAP 134* 149* 195*         Condition -  Guarded  Family Communication  :  none present  Code Status :  Full  Consults  :  Renal  PUD Prophylaxis :    Procedures  :     CTA - No evidence of pulmonary emboli. Bilateral lower lobe atelectasis. Changes of mild CHF. Displaced right sixth rib fracture without complicating factors. 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 Aortic Atherosclerosis      Disposition Plan  :    Status is: Observation  DVT Prophylaxis  :    heparin injection 5,000 Units Start: 09/30/21 2200 SCDs Start: 09/30/21 2139     Lab Results  Component Value Date   PLT 444 (H) 10/01/2021    Diet :  Diet Order             Diet renal/carb modified with fluid restriction Diet-HS Snack? Nothing; Fluid restriction: 1200 mL Fluid; Room service appropriate? Yes; Fluid consistency: Thin  Diet effective now                    Inpatient Medications  Scheduled Meds:  amLODipine  10 mg Oral Daily   atropine  1 drop Both Eyes BID   calcium acetate  2,001 mg Oral TID WC   carvedilol  6.25 mg Oral BID WC   dorzolamide-timolol  1 drop Both Eyes BID   furosemide  40 mg Oral Daily   gabapentin  600 mg Oral BID   heparin  5,000 Units Subcutaneous Q8H   hydrALAZINE  100 mg Oral Q8H   insulin aspart  0-5 Units Subcutaneous QHS   insulin aspart  0-6 Units Subcutaneous TID WC   lamoTRIgine  25 mg Oral Daily   pravastatin  80 mg Oral q1800   Continuous Infusions:   PRN Meds:.acetaminophen **OR** acetaminophen, albuterol, ondansetron **OR**  ondansetron (ZOFRAN) IV  Antibiotics  :    Anti-infectives (From admission, onward)    Start     Dose/Rate Route Frequency Ordered Stop   09/30/21 2015  vancomycin (VANCOREADY) IVPB 1250 mg/250 mL        1,250 mg 166.7 mL/hr over 90 Minutes Intravenous  Once 09/30/21 2008 10/01/21 0122   09/30/21 1145  ceFEPIme (MAXIPIME) 2 g in sodium chloride 0.9 % 100 mL IVPB        2 g 200 mL/hr over 30 Minutes Intravenous  Once 09/30/21 1130 09/30/21 2053   09/30/21 1145  vancomycin (VANCOREADY) IVPB 1250 mg/250 mL  Status:  Discontinued        1,250 mg 166.7 mL/hr over 90 Minutes Intravenous  Once 09/30/21 1137 09/30/21 2008        Time Spent in minutes  30   Tammy Moses M.D on 10/03/2021 at 10:24 AM  To page go to [email or web address]   [facility] -  Office  [phone]  See all Orders from today for further details    Objective:   Vitals:   10/02/21 1340 10/02/21 1345 10/03/21 0423 10/03/21 0620  BP: (!) 190/58 (!) 168/67  (!) 159/47  Pulse:    70  Resp: 20 20  18   Temp: 97.7 F (36.5 C) 97.7 F (36.5 C)  97.8 F (36.6 C)  TempSrc:    Oral  SpO2:    99%  Weight:  56.6 kg 54.3 kg   Height:  Wt Readings from Last 3 Encounters:  10/03/21 54.3 kg  09/23/21 54.4 kg  05/15/21 54.4 kg     Intake/Output Summary (Last 24 hours) at 10/03/2021 1024 Last data filed at 10/02/2021 1442 Gross per 24 hour  Intake --  Output 3075 ml  Net -3075 ml     Physical Exam  Awake Alert, No new F.N deficits, legally blind, with no vision in the right eye and minimal to blurry vision in the left Tammy Moses.AT,PERRAL Supple Neck, No JVD,   Symmetrical Chest wall movement, Good air movement bilaterally, CTAB RRR,No Gallops, Rubs or new Murmurs,  +ve B.Sounds, Abd Soft, No tenderness,   No Cyanosis, Clubbing or edema     Data Review:    CBC Recent Labs  Lab 09/30/21 1055 09/30/21 1106 09/30/21 2118 10/01/21 0400 10/01/21 1548  WBC 21.3*  --  25.9* 21.9* 19.4*  HGB  10.7* 12.2  11.9* 9.7* 10.2* 10.6*  HCT 34.2* 36.0  35.0* 30.2* 32.0* 34.0*  PLT 518*  --  390 422* 444*  MCV 92.2  --  91.0 91.7 91.4  MCH 28.8  --  29.2 29.2 28.5  MCHC 31.3  --  32.1 31.9 31.2  RDW 16.9*  --  16.8* 17.1* 16.7*  LYMPHSABS 1.9  --   --  1.0  --   MONOABS 2.0*  --   --  1.2*  --   EOSABS 0.3  --   --  0.0  --   BASOSABS 0.1  --   --  0.1  --     Electrolytes Recent Labs  Lab 09/30/21 1055 09/30/21 1106 09/30/21 1200 09/30/21 1316 09/30/21 2118 10/01/21 0400 10/02/21 0124 10/02/21 0153  NA  --    < > 133* 134* 134* 131*  --  136  K  --    < > 6.1* 5.8* 4.5 5.0  --  4.3  CL  --    < > 100 99 97* 96*  --  95*  CO2  --   --  17* 17* 22 22  --  23  GLUCOSE  --    < > 163* 177* 317* 127*  --  92  BUN  --    < > 83* 83* 40* 48*  --  65*  CREATININE  --    < > 13.70* 13.61* 8.42* 9.17*  --  10.14*  CALCIUM  --   --  8.9 9.0 8.6* 8.5*  --  7.9*  AST  --   --  19 23  --  13*  --   --   ALT  --   --  16 18  --  10  --   --   ALKPHOS  --   --  64 66  --  59  --   --   BILITOT  --   --  0.6 0.5  --  0.5  --   --   ALBUMIN  --   --  3.0* 3.2*  --  2.6*  --  2.4*  DDIMER 1.76*  --   --   --   --   --   --   --   PROCALCITON  --   --  0.68  --   --  2.66 3.09  --   HGBA1C  --   --   --   --  5.9*  --   --   --   BNP 3,585.7*  --   --   --  4,037.8*  --   --   --    < > = values in this interval not displayed.    ------------------------------------------------------------------------------------------------------------------ No results for input(s): CHOL, HDL, LDLCALC, TRIG, CHOLHDL, LDLDIRECT in the last 72 hours.  Lab Results  Component Value Date   HGBA1C 5.9 (H) 09/30/2021     Radiology Reports CT Angio Chest PE W and/or Wo Contrast  Result Date: 10/01/2021 CLINICAL DATA:  Shortness of breath and chest pain EXAM: CT ANGIOGRAPHY CHEST WITH CONTRAST TECHNIQUE: Multidetector CT imaging of the chest was performed using the standard protocol during bolus  administration of intravenous contrast. Multiplanar CT image reconstructions and MIPs were obtained to evaluate the vascular anatomy. CONTRAST:  90mL OMNIPAQUE IOHEXOL 350 MG/ML SOLN COMPARISON:  09/30/2021, CT from 05/15/2021 FINDINGS: Cardiovascular: Thoracic aorta and its branches demonstrate atherosclerotic calcifications. No aneurysmal dilatation or dissection is noted. Mild cardiac enlargement is seen. Scattered coronary calcifications are noted. Pulmonary artery is well visualized within normal branching pattern. No filling defect to suggest pulmonary embolism is noted. Mediastinum/Nodes: Thoracic inlet is within normal limits. No sizable hilar or mediastinal adenopathy is noted. The esophagus as visualized is within normal limits. Lungs/Pleura: Lungs are well aerated bilaterally with the exception of bilateral lower lobe atelectatic changes. Mild changes of vascular congestion and central parenchymal edema are seen. No pneumothorax is noted. Small effusions are seen bilaterally. Upper Abdomen: Visualized upper abdomen demonstrates a thrombosed calcified splenic artery aneurysm. No other focal abnormality is seen. Musculoskeletal: Undisplaced right sixth rib fracture is noted anteriorly. Degenerative changes of the thoracic spine are noted. Review of the MIP images confirms the above findings. IMPRESSION: No evidence of pulmonary emboli. Bilateral lower lobe atelectasis. Changes of mild CHF. Displaced right sixth rib fracture without complicating factors. 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 Aortic Atherosclerosis (ICD10-I70.0). Electronically Signed   By: Inez Catalina M.D.   On: 10/01/2021 02:59   DG Chest Port 1 View  Result Date: 10/01/2021 CLINICAL DATA:  Shortness of breath EXAM: PORTABLE CHEST 1 VIEW COMPARISON:  Chest radiograph 1 day prior FINDINGS: The heart is enlarged, unchanged. The mediastinal contours are stable. Aeration of the right base has improved.  There is no overt pulmonary edema. There is no new or worsening focal airspace disease. The small pleural effusion seen on the prior CT are not well seen on the current study. There is no enlarging pleural effusion. There is no pneumothorax. IMPRESSION: Improved aeration of the right base compared to the study from 1 day prior. No new or worsening airspace disease. Electronically Signed   By: Valetta Mole M.D.   On: 10/01/2021 08:02   DG Chest Port 1 View  Result Date: 09/30/2021 CLINICAL DATA:  Shortness of breath.  COPD. EXAM: PORTABLE CHEST 1 VIEW COMPARISON:  09/20/2021 FINDINGS: Midline trachea. Cardiomegaly accentuated by AP portable technique. Atherosclerosis in the transverse aorta. No pneumothorax. Suspect trace right pleural fluid. Interstitial edema, as evidenced by septal thickening and interstitial indistinctness. Right greater than left base airspace disease. IMPRESSION: Cardiomegaly with mild interstitial edema. Bibasilar airspace disease. Although this could represent atelectasis, especially in the medial right lung base, concurrent infection is a concern. Possible trace right pleural fluid. Aortic Atherosclerosis (ICD10-I70.0). Electronically Signed   By: Abigail Miyamoto M.D.   On: 09/30/2021 11:14

## 2021-10-03 NOTE — NC FL2 (Signed)
[place] LEVEL OF CARE SCREENING TOOL     IDENTIFICATION  Patient Name: Tammy Moses Birthdate: June 25, 1966 Sex: female Admission Date (Current Location): 09/30/2021  [facility] and [place] Number:  Herbalist and Address:  [facility] [place], [place] [address] [place], [place] [address]      Provider Number: [id number]  Attending Physician Name and Address:  Thurnell Lose, MD  Relative Name and Phone Number:       Current Level of Care: Hospital Recommended Level of Care: [place] Prior Approval Number:    Date Approved/Denied:   PASRR Number: [id number]  Discharge Plan: SNF    Current Diagnoses: Patient Active Problem List   Diagnosis Date Noted   Malignant hypertension 09/30/2021   Flash pulmonary edema ([place]) 09/30/2021   Non compliance with medical treatment 09/30/2021   Foot pain, right 09/22/2021   Elevated troponin 09/16/2021   Anemia due to chronic kidney disease 09/16/2021   Impaired functional mobility and activity tolerance 01/04/2019   Pneumonia of both lungs due to infectious organism 10/05/2017   Fall    Anemia 07/12/2017   COPD (chronic obstructive pulmonary disease) ([place]) 06/24/2017   ESRD (end stage renal disease) ([place]) -  Dialyzes at [facility].  On Monday Wednesday Friday. 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[place]) 05/10/2017   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   Vitamin D deficiency 04/07/2017   Absolute glaucoma of right eye 12/27/2016   Chronic angle-closure glaucoma of left eye, severe stage 12/27/2016   PCO (posterior capsule opacification), left 12/27/2016   Gait abnormality 03/07/2014   Abnormality of gait 03/07/2014   Hepatitis C virus  infection without hepatic coma 03/04/2014   History of hepatitis C 03/04/2014   Erosive gastritis 02/14/2014   Pseudophakia of left eye 10/30/2012   Asthma 10/13/2012   Reflux 10/13/2012   Lens replaced by other means 10/12/2012   Primary open angle glaucoma 10/12/2012   BACK PAIN, LUMBAR 08/18/2010   IDDM 08/11/2010   HYPERCHOLESTEROLEMIA 08/11/2010   HYPOKALEMIA 08/11/2010   DEPRESSION 08/11/2010   GLAUCOMA 08/11/2010   BLINDNESS, RIGHT EYE 08/11/2010   Essential hypertension 08/11/2010   GERD 08/11/2010   CONSTIPATION 08/06/2009    Orientation RESPIRATION BLADDER Height & Weight     Self, Time, Situation, Place  Normal Continent, External catheter Weight: 119 lb 9.6 oz (54.3 kg) Height:  5\' 1"  (154.9 cm)  BEHAVIORAL SYMPTOMS/MOOD NEUROLOGICAL BOWEL NUTRITION STATUS      Continent Diet (See DC summary)  AMBULATORY STATUS COMMUNICATION OF NEEDS Skin   Limited Assist Verbally Normal                       Personal Care Assistance Level of Assistance  Bathing, Feeding, Dressing Bathing Assistance: Limited assistance Feeding assistance: Independent Dressing Assistance: Limited assistance     Functional Limitations Info  Sight, Hearing, Speech Sight Info: Impaired Hearing Info: Adequate Speech Info: Adequate    SPECIAL CARE FACTORS FREQUENCY  PT (By licensed PT), OT (By licensed OT)     PT Frequency: 5x per week OT Frequency: 5x per week            Contractures Contractures Info: Not present    Additional  Factors Info  Code Status, Allergies, Insulin Sliding Scale Code Status Info: Full     Insulin Sliding Scale Info: See DC summary       Current Medications (10/03/2021):  This is the current hospital active medication list Current Facility-Administered Medications  Medication Dose Route Frequency Provider Last Rate Last Admin   acetaminophen (TYLENOL) tablet 650 mg  650 mg Oral Q6H PRN Kristopher Oppenheim, DO   650 mg at 10/03/21 0100   Or    acetaminophen (TYLENOL) suppository 650 mg  650 mg Rectal Q6H PRN Kristopher Oppenheim, DO       albuterol (PROVENTIL) (2.5 MG/3ML) 0.083% nebulizer solution 2.5 mg  2.5 mg Nebulization Q2H PRN Kristopher Oppenheim, DO       amLODipine (NORVASC) tablet 10 mg  10 mg Oral Daily Thurnell Lose, MD   10 mg at 10/03/21 0753   atropine 1 % ophthalmic solution 1 drop  1 drop Both Eyes BID Thurnell Lose, MD   1 drop at 10/03/21 0803   calcium acetate (PHOSLO) capsule 2,001 mg  2,001 mg Oral TID WC Thurnell Lose, MD   2,001 mg at 10/03/21 1635   carvedilol (COREG) tablet 6.25 mg  6.25 mg Oral BID WC Thurnell Lose, MD   6.25 mg at 10/03/21 1635   dorzolamide-timolol (COSOPT) 22.3-6.8 MG/ML ophthalmic solution 1 drop  1 drop Both Eyes BID Thurnell Lose, MD   1 drop at 10/03/21 0803   furosemide (LASIX) tablet 40 mg  40 mg Oral Daily Kristopher Oppenheim, DO   40 mg at 10/03/21 0755   gabapentin (NEURONTIN) tablet 600 mg  600 mg Oral BID Kristopher Oppenheim, DO   600 mg at 10/03/21 0754   heparin injection 5,000 Units  5,000 Units Subcutaneous Q8H Kristopher Oppenheim, DO   5,000 Units at 10/03/21 1452   hydrALAZINE (APRESOLINE) tablet 100 mg  100 mg Oral Q8H Thurnell Lose, MD   100 mg at 10/03/21 1452   insulin aspart (novoLOG) injection 0-5 Units  0-5 Units Subcutaneous QHS Kristopher Oppenheim, DO   2 Units at 10/01/21 2318   insulin aspart (novoLOG) injection 0-6 Units  0-6 Units Subcutaneous TID WC Kristopher Oppenheim, DO   1 Units at 10/03/21 1140   lamoTRIgine (LAMICTAL) tablet 25 mg  25 mg Oral Daily Kristopher Oppenheim, DO   25 mg at 10/03/21 0800   ondansetron (ZOFRAN) tablet 4 mg  4 mg Oral Q6H PRN Kristopher Oppenheim, DO       Or   ondansetron [facility]) injection 4 mg  4 mg Intravenous Q6H PRN Kristopher Oppenheim, DO       pravastatin (PRAVACHOL) tablet 80 mg  80 mg Oral q1800 Kristopher Oppenheim, DO   80 mg at 10/03/21 1635     Discharge Medications: Please see discharge summary for a list of discharge medications.  Relevant Imaging Results:  Relevant Lab  Results:   Additional Information SSN [phone] HD MWF in [place] Pt reports vaccinated with one booster Pt is willing to change HD centers.   Bary Castilla, LCSW

## 2021-10-03 NOTE — Evaluation (Signed)
Occupational Therapy Evaluation Patient Details Name: Tammy Moses MRN: [id number] DOB: 12-16-1965 Today's Date: 10/03/2021   History of Present Illness 55 year old African-American female with a history of end-stage renal disease needing dialysis on Monday Wednesday Friday with a history of noncompliance, type 2 diabetes, hypertension, anemia chronic disease, COPD, R eye blindness, diabetic retinopathy, neuropathy, chronic tobacco abuse presented to the ER today via EMS with hypoxia and respiratory distress, +++ BP.   Clinical Impression   Pt presents with decline in function and safety with ADLs and ADL mobility with impaired strength, balance and endurance; pt with poor insight into deficits.  PTA pt reported that she was independent with all ADL's and functional mobility and that she has an aide that comes to her home 2-3 times a week for 3 hours from 3am-6am to assist with IADL's. Pt currently requires min A with grooming standing at sink, set up/Sup with UB selfcare seated, mod A with Lb selfcare, min A with toileting and min A with mobility/transfers using RW and SPTs. Pt would benefit from acute OT services to address impairments to maximize level of function and safety     Recommendations for follow up therapy are one component of a multi-disciplinary discharge planning process, led by the attending physician.  Recommendations may be updated based on patient status, additional functional criteria and insurance authorization.   Follow Up Recommendations  Skilled nursing-short term rehab (<3 hours/day)    Assistance Recommended at Discharge Frequent or constant Supervision/Assistance  Functional Status Assessment  Patient has had a recent decline in their functional status and demonstrates the ability to make significant improvements in function in a reasonable and predictable amount of time.  Equipment Recommendations  Other (comment) (TBD at SNF)    Recommendations for Other  Services       Precautions / Restrictions Precautions Precautions: Fall;Other (comment) Precaution Comments: blind, Rt foot drop Restrictions Weight Bearing Restrictions: No      Mobility Bed Mobility Overal bed mobility: Needs Assistance Bed Mobility: Supine to Sit;Sit to Supine     Supine to sit: Modified independent (Device/Increase time);Supervision Sit to supine: Modified independent (Device/Increase time);Supervision   General bed mobility comments: supervision for safety, use of rails with some guidance due to blindness.    Transfers Overall transfer level: Needs assistance Equipment used: Rolling walker (2 wheels) Transfers: Sit to/from Stand Sit to Stand: Min guard           General transfer comment: assist for balance      Balance Overall balance assessment: Needs assistance Sitting-balance support: No upper extremity supported;Feet supported Sitting balance-Leahy Scale: Good     Standing balance support: During functional activity;Reliant on assistive device for balance Standing balance-Leahy Scale: Poor                             ADL either performed or assessed with clinical judgement   ADL Overall ADL's : Needs assistance/impaired Eating/Feeding: Set up;Sitting   Grooming: Min guard;Wash/dry face;Wash/dry hands;Oral care;Standing   Upper Body Bathing: Min guard;Sitting   Lower Body Bathing: Moderate assistance;Sitting/lateral leans;Sit to/from stand   Upper Body Dressing : Min guard;Sitting   Lower Body Dressing: Sitting/lateral leans;Moderate assistance;Sit to/from stand Lower Body Dressing Details (indicate cue type and reason): Donned/doffed socks at EOB with assist for balance Toilet Transfer: Minimal assistance;Cueing for safety;Stand-pivot;BSC/3in1;Rolling walker (2 wheels);Ambulation   Toileting- Clothing Manipulation and Hygiene: Sit to/from stand;Sitting/lateral lean;Minimal assistance  Functional mobility  during ADLs: Minimal assistance;Rolling walker (2 wheels);Cueing for safety General ADL Comments: Pt very weak and unsteady, requiring hands on assist     Vision Baseline Vision/History: 2 Legally blind Ability to See in Adequate Light: 4 Severely impaired Patient Visual Report: No change from baseline       Perception     Praxis      Pertinent Vitals/Pain Pain Assessment: 0-10 Pain Score: 5  Pain Location: B feet Pain Descriptors / Indicators: Sore Pain Intervention(s): Monitored during session;Premedicated before session;Repositioned     Hand Dominance Right   Extremity/Trunk Assessment Upper Extremity Assessment Upper Extremity Assessment: Generalized weakness   Lower Extremity Assessment Lower Extremity Assessment: Defer to PT evaluation   Cervical / Trunk Assessment Cervical / Trunk Assessment: Normal   Communication Communication Communication: No difficulties   Cognition Arousal/Alertness: Awake/alert Behavior During Therapy: WFL for tasks assessed/performed Overall Cognitive Status: No family/caregiver present to determine baseline cognitive functioning                                 General Comments: decreased safety awareness     General Comments       Exercises     Shoulder Instructions      Home Living Family/patient expects to be discharged to:: Skilled nursing facility Living Arrangements: Alone Available Help at Discharge: Personal care attendant Type of Home: Apartment Home Access: Level entry     Home Layout: One level     Bathroom Shower/Tub: Tub/shower unit         Home Equipment: Rollator (4 wheels);Cane - single point;Toilet riser          Prior Functioning/Environment Prior Level of Function : Independent/Modified Independent;History of Falls (last six months)             Mobility Comments: history of 5 falls in 6 months ADLs Comments: has PCA 2-3 hours/day and comes 3 AM MWF        OT Problem  List: Decreased strength;Decreased activity tolerance;Impaired balance (sitting and/or standing);Decreased coordination;Decreased cognition;Decreased safety awareness;Decreased knowledge of use of DME or AE      OT Treatment/Interventions: Self-care/ADL training;Therapeutic exercise;Energy conservation;DME and/or AE instruction;Therapeutic activities;Cognitive remediation/compensation;Patient/family education;Balance training    OT Goals(Current goals can be found in the care plan section) Acute Rehab OT Goals Patient Stated Goal: "get stronger and go home" OT Goal Formulation: With patient Time For Goal Achievement: 10/17/21 Potential to Achieve Goals: Good ADL Goals Pt Will Perform Grooming: with supervision;with set-up;standing Pt Will Perform Lower Body Bathing: with min assist;with min guard assist Pt Will Perform Lower Body Dressing: with min assist;with min guard assist Pt Will Transfer to Toilet: with min guard assist;with supervision;ambulating Pt Will Perform Toileting - Clothing Manipulation and hygiene: with min guard assist;with supervision  OT Frequency: Min 2X/week   Barriers to D/C:            Co-evaluation              AM-PAC OT "6 Clicks" Daily Activity     Outcome Measure Help from another person eating meals?: None Help from another person taking care of personal grooming?: A Little Help from another person toileting, which includes using toliet, bedpan, or urinal?: A Little Help from another person bathing (including washing, rinsing, drying)?: A Lot Help from another person to put on and taking off regular upper body clothing?: A Little Help from another person to put  on and taking off regular lower body clothing?: A Lot 6 Click Score: 17   End of Session Equipment Utilized During Treatment: Gait belt;Rolling walker (2 wheels);Other (comment) (BSC) Nurse Communication: Mobility status  Activity Tolerance: Patient tolerated treatment well Patient left:  in bed;with call bell/phone within reach  OT Visit Diagnosis: Unsteadiness on feet (R26.81);Other abnormalities of gait and mobility (R26.89);Muscle weakness (generalized) (M62.81)                Time: 1006-1030 OT Time Calculation (min): 24 min Charges:  OT General Charges $OT Visit: 1 Visit OT Evaluation $OT Eval Moderate Complexity: 1 Mod OT Treatments $Self Care/Home Management : 8-22 mins    Britt Bottom 10/03/2021, 12:56 PM

## 2021-10-04 LAB — GLUCOSE, CAPILLARY
Glucose-Capillary: 141 mg/dL — ABNORMAL HIGH (ref 70–99)
Glucose-Capillary: 134 mg/dL — ABNORMAL HIGH (ref 70–99)
Glucose-Capillary: 138 mg/dL — ABNORMAL HIGH (ref 70–99)
Glucose-Capillary: 165 mg/dL — ABNORMAL HIGH (ref 70–99)

## 2021-10-04 MED ORDER — CHLORHEXIDINE GLUCONATE CLOTH 2 % EX PADS
6.0000 | MEDICATED_PAD | Freq: Every day | CUTANEOUS | Status: DC
  Administered 2021-10-04 – 2021-10-07 (×4): 6 via TOPICAL

## 2021-10-04 NOTE — Progress Notes (Signed)
PROGRESS NOTE                                                                                                                                                                                                             Patient Demographics:    Tammy Moses, is a 55 y.o. female, DOB - Jan 09, 1966, KAJ:[id number]  Outpatient Primary MD for the patient is [name], FNP    LOS - 3  Admit date - 09/30/2021    Chief Complaint  Patient presents with   Shortness of Breath       Brief Narrative (HPI from H&P)  55 year old African-American female with a history of end-stage renal disease needing dialysis on Monday Wednesday Friday with a history of noncompliance, chronic leukocytosis, type 2 diabetes, hypertension, anemia chronic disease, COPD, chronic tobacco abuse presented to the ER today via EMS with hypoxia and respiratory distress, +++ BP.   Subjective:   Patient in bed, appears comfortable, denies any headache, no fever, no chest pain or pressure, no shortness of breath , no abdominal pain. No new focal weakness.    Assessment  & Plan :     Acute Hypoxic Resp. Failure due to fluid overload from missed dialysis causing flash pulmonary edema, malignant hypertension - all due to noncompliance with HD regimen and blood pressure medications, was urgently dialyzed 09/30/2021 and we need another dialysis treatment on 10/02/2021.  Shortness of breath is improved but blood pressure is still quite high medications adjusted.  Monitor closely counseled on compliance.  2.  ESRD.  On MWF schedule nephrology on board.  Counseled on compliance  3.  Poorly controlled hypertension.  Noncompliant.  Counseled, medications adjusted further on 10/01/2021 for better control.  4.  Legal blindness.  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  Lives alone, poor living situation, seen by PT OT qualifies for SNF, have requested  social work to look for SNF bed.  5.  COPD.  At baseline.  6.  Bilateral foot and toe pain.  Check uric acid level, trial of colchicine.  7.  Acute on chronic diastolic CHF EF 03% on recent echo.  As in #1 above.    8.  Chronic leukocytosis.  Signs of active infection, outpatient PCP directed work-up.  9. DM type II.  On sliding scale.  Lab  Results  Component Value Date   HGBA1C 5.9 (H) 09/30/2021    CBG (last 3)  Recent Labs    10/03/21 1600 10/03/21 2109 10/04/21 0739  GLUCAP 149* 190* 141*         Condition -  Guarded  Family Communication  :  none present  Code Status :  Full  Consults  :  Renal  PUD Prophylaxis :    Procedures  :     CTA - No evidence of pulmonary emboli. Bilateral lower lobe atelectasis. Changes of mild CHF. Displaced right sixth rib fracture without complicating factors. 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 Aortic Atherosclerosis      Disposition Plan  :    Status is: Observation  DVT Prophylaxis  :    heparin injection 5,000 Units Start: 09/30/21 2200 SCDs Start: 09/30/21 2139     Lab Results  Component Value Date   PLT 444 (H) 10/01/2021    Diet :  Diet Order             Diet renal/carb modified with fluid restriction Diet-HS Snack? Nothing; Fluid restriction: 1200 mL Fluid; Room service appropriate? Yes; Fluid consistency: Thin  Diet effective now                    Inpatient Medications  Scheduled Meds:  amLODipine  10 mg Oral Daily   atropine  1 drop Both Eyes BID   calcium acetate  2,001 mg Oral TID WC   carvedilol  6.25 mg Oral BID WC   Chlorhexidine Gluconate Cloth  6 each Topical Q0600   dorzolamide-timolol  1 drop Both Eyes BID   furosemide  40 mg Oral Daily   gabapentin  600 mg Oral BID   heparin  5,000 Units Subcutaneous Q8H   hydrALAZINE  100 mg Oral Q8H   insulin aspart  0-5 Units Subcutaneous QHS   insulin aspart  0-6 Units Subcutaneous TID WC    lamoTRIgine  25 mg Oral Daily   pravastatin  80 mg Oral q1800   Continuous Infusions:   PRN Meds:.acetaminophen **OR** acetaminophen, albuterol, ondansetron **OR** ondansetron (ZOFRAN) IV  Antibiotics  :    Anti-infectives (From admission, onward)    Start     Dose/Rate Route Frequency Ordered Stop   09/30/21 2015  vancomycin (VANCOREADY) IVPB 1250 mg/250 mL        1,250 mg 166.7 mL/hr over 90 Minutes Intravenous  Once 09/30/21 2008 10/01/21 0122   09/30/21 1145  ceFEPIme (MAXIPIME) 2 g in sodium chloride 0.9 % 100 mL IVPB        2 g 200 mL/hr over 30 Minutes Intravenous  Once 09/30/21 1130 09/30/21 2053   09/30/21 1145  vancomycin (VANCOREADY) IVPB 1250 mg/250 mL  Status:  Discontinued        1,250 mg 166.7 mL/hr over 90 Minutes Intravenous  Once 09/30/21 1137 09/30/21 2008        Time Spent in minutes  30   Lala Lund M.D on 10/04/2021 at 10:54 AM  To page go to [email or web address]   [facility] -  Office  [phone]  See all Orders from today for further details    Objective:   Vitals:   10/04/21 0451 10/04/21 0819 10/04/21 0822 10/04/21 1048  BP: (!) 147/54 (!) 161/56  (!) 136/41  Pulse: 71 74  74  Resp: 16 16    Temp: 98.4 F (36.9 C) 98.1 F (36.7 C)  TempSrc: Oral Oral    SpO2: 96% 96% 98%   Weight:      Height:        Wt Readings from Last 3 Encounters:  10/03/21 54.3 kg  09/23/21 54.4 kg  05/15/21 54.4 kg    No intake or output data in the 24 hours ending 10/04/21 1054    Physical Exam  Awake Alert, No new F.N deficits, legally blind, with no vision in the right eye and minimal to blurry vision in the left .AT,PERRAL Supple Neck, No JVD,   Symmetrical Chest wall movement, Good air movement bilaterally, CTAB RRR,No Gallops, Rubs or new Murmurs,  +ve B.Sounds, Abd Soft, No tenderness,   No Cyanosis, Clubbing or edema      Data Review:    CBC Recent Labs  Lab 09/30/21 1055 09/30/21 1106 09/30/21 2118 10/01/21 0400  10/01/21 1548  WBC 21.3*  --  25.9* 21.9* 19.4*  HGB 10.7* 12.2  11.9* 9.7* 10.2* 10.6*  HCT 34.2* 36.0  35.0* 30.2* 32.0* 34.0*  PLT 518*  --  390 422* 444*  MCV 92.2  --  91.0 91.7 91.4  MCH 28.8  --  29.2 29.2 28.5  MCHC 31.3  --  32.1 31.9 31.2  RDW 16.9*  --  16.8* 17.1* 16.7*  LYMPHSABS 1.9  --   --  1.0  --   MONOABS 2.0*  --   --  1.2*  --   EOSABS 0.3  --   --  0.0  --   BASOSABS 0.1  --   --  0.1  --     Electrolytes Recent Labs  Lab 09/30/21 1055 09/30/21 1106 09/30/21 1200 09/30/21 1316 09/30/21 2118 10/01/21 0400 10/02/21 0124 10/02/21 0153  NA  --    < > 133* 134* 134* 131*  --  136  K  --    < > 6.1* 5.8* 4.5 5.0  --  4.3  CL  --    < > 100 99 97* 96*  --  95*  CO2  --   --  17* 17* 22 22  --  23  GLUCOSE  --    < > 163* 177* 317* 127*  --  92  BUN  --    < > 83* 83* 40* 48*  --  65*  CREATININE  --    < > 13.70* 13.61* 8.42* 9.17*  --  10.14*  CALCIUM  --   --  8.9 9.0 8.6* 8.5*  --  7.9*  AST  --   --  19 23  --  13*  --   --   ALT  --   --  16 18  --  10  --   --   ALKPHOS  --   --  64 66  --  59  --   --   BILITOT  --   --  0.6 0.5  --  0.5  --   --   ALBUMIN  --   --  3.0* 3.2*  --  2.6*  --  2.4*  DDIMER 1.76*  --   --   --   --   --   --   --   PROCALCITON  --   --  0.68  --   --  2.66 3.09  --   HGBA1C  --   --   --   --  5.9*  --   --   --   BNP  3,585.7*  --   --   --  4,037.8*  --   --   --    < > = values in this interval not displayed.    ------------------------------------------------------------------------------------------------------------------ No results for input(s): CHOL, HDL, LDLCALC, TRIG, CHOLHDL, LDLDIRECT in the last 72 hours.  Lab Results  Component Value Date   HGBA1C 5.9 (H) 09/30/2021     Radiology Reports CT Angio Chest PE W and/or Wo Contrast  Result Date: 10/01/2021 CLINICAL DATA:  Shortness of breath and chest pain EXAM: CT ANGIOGRAPHY CHEST WITH CONTRAST TECHNIQUE: Multidetector CT imaging of the chest was  performed using the standard protocol during bolus administration of intravenous contrast. Multiplanar CT image reconstructions and MIPs were obtained to evaluate the vascular anatomy. CONTRAST:  36mL OMNIPAQUE IOHEXOL 350 MG/ML SOLN COMPARISON:  09/30/2021, CT from 05/15/2021 FINDINGS: Cardiovascular: Thoracic aorta and its branches demonstrate atherosclerotic calcifications. No aneurysmal dilatation or dissection is noted. Mild cardiac enlargement is seen. Scattered coronary calcifications are noted. Pulmonary artery is well visualized within normal branching pattern. No filling defect to suggest pulmonary embolism is noted. Mediastinum/Nodes: Thoracic inlet is within normal limits. No sizable hilar or mediastinal adenopathy is noted. The esophagus as visualized is within normal limits. Lungs/Pleura: Lungs are well aerated bilaterally with the exception of bilateral lower lobe atelectatic changes. Mild changes of vascular congestion and central parenchymal edema are seen. No pneumothorax is noted. Small effusions are seen bilaterally. Upper Abdomen: Visualized upper abdomen demonstrates a thrombosed calcified splenic artery aneurysm. No other focal abnormality is seen. Musculoskeletal: Undisplaced right sixth rib fracture is noted anteriorly. Degenerative changes of the thoracic spine are noted. Review of the MIP images confirms the above findings. IMPRESSION: No evidence of pulmonary emboli. Bilateral lower lobe atelectasis. Changes of mild CHF. Displaced right sixth rib fracture without complicating factors. 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 Aortic Atherosclerosis (ICD10-I70.0). Electronically Signed   By: Inez Catalina M.D.   On: 10/01/2021 02:59   DG Chest Port 1 View  Result Date: 10/01/2021 CLINICAL DATA:  Shortness of breath EXAM: PORTABLE CHEST 1 VIEW COMPARISON:  Chest radiograph 1 day prior FINDINGS: The heart is enlarged, unchanged. The mediastinal contours are  stable. Aeration of the right base has improved. There is no overt pulmonary edema. There is no new or worsening focal airspace disease. The small pleural effusion seen on the prior CT are not well seen on the current study. There is no enlarging pleural effusion. There is no pneumothorax. IMPRESSION: Improved aeration of the right base compared to the study from 1 day prior. No new or worsening airspace disease. Electronically Signed   By: Valetta Mole M.D.   On: 10/01/2021 08:02   DG Chest Port 1 View  Result Date: 09/30/2021 CLINICAL DATA:  Shortness of breath.  COPD. EXAM: PORTABLE CHEST 1 VIEW COMPARISON:  09/20/2021 FINDINGS: Midline trachea. Cardiomegaly accentuated by AP portable technique. Atherosclerosis in the transverse aorta. No pneumothorax. Suspect trace right pleural fluid. Interstitial edema, as evidenced by septal thickening and interstitial indistinctness. Right greater than left base airspace disease. IMPRESSION: Cardiomegaly with mild interstitial edema. Bibasilar airspace disease. Although this could represent atelectasis, especially in the medial right lung base, concurrent infection is a concern. Possible trace right pleural fluid. Aortic Atherosclerosis (ICD10-I70.0). Electronically Signed   By: Abigail Miyamoto M.D.   On: 09/30/2021 11:14

## 2021-10-04 NOTE — Progress Notes (Signed)
Reordered continuous cardiac monitoring. Attempted to place patient back on telemetry but patient refused stating it disturbs her sleep, feels uncomfortable on her chest and is generally irritating. Educated patient on use and importance. Patient still refused. Tele remains off.

## 2021-10-04 NOTE — Progress Notes (Signed)
[facility] NEPHROLOGY PROGRESS NOTE  Assessment/ Plan:  OP HD orders: [facility], MWF, 2.5hrs, f180, 2k, 2.5cal, edw 52kg, aranesp 31mcg qweekly, venofer 50mg  qwkly  # Hyperkalemia: Improved with dialysis.  Continue low potassium diet.  #Acute pulmonary edema/shortness of breath: Improved with HD and ultrafiltration.  # ESRD: MWF at [facility].  Status post HD on 12/9 with 3 L UF, plan for next HD on 12/12. UF as tolerated.  Clinically looks euvolemic.  # Anemia of ESRD: Hemoglobin at goal.  ESA with HD.  # Secondary hyperparathyroidism: Continue PhosLo for hyperphosphatemia.  Monitor lab.  # HTN/volume: Blood pressure acceptable, volume managed with dialysis.  #Dispo: It seems like patient has poor living situation therefore wants to be placed in SNF.  PT, OT and social worker evaluation ongoing.  Discussed with the primary team.  Subjective: Seen and examined.  No new event.  Denies nausea, vomiting, chest pain, shortness of breath.  Awaiting safe discharge plan. Objective Vital signs in last 24 hours: Vitals:   10/03/21 2109 10/04/21 0451 10/04/21 0819 10/04/21 0822  BP: (!) 157/54 (!) 147/54 (!) 161/56   Pulse: 74 71 74   Resp: 19 16 16    Temp: 98.4 F (36.9 C) 98.4 F (36.9 C) 98.1 F (36.7 C)   TempSrc: Oral Oral Oral   SpO2: 98% 96% 96% 98%  Weight:      Height:       Weight change:  No intake or output data in the 24 hours ending 10/04/21 1048      Labs: Basic Metabolic Panel: Recent Labs  Lab 09/30/21 2118 10/01/21 0400 10/02/21 0153  NA 134* 131* 136  K 4.5 5.0 4.3  CL 97* 96* 95*  CO2 22 22 23   GLUCOSE 317* 127* 92  BUN 40* 48* 65*  CREATININE 8.42* 9.17* 10.14*  CALCIUM 8.6* 8.5* 7.9*  PHOS  --   --  6.6*    Liver Function Tests: Recent Labs  Lab 09/30/21 1200 09/30/21 1316 10/01/21 0400 10/02/21 0153  AST 19 23 13*  --   ALT 16 18 10   --   ALKPHOS 64 66 59  --   BILITOT 0.6 0.5 0.5  --   PROT 7.6 7.9 7.2  --    ALBUMIN 3.0* 3.2* 2.6* 2.4*    No results for input(s): LIPASE, AMYLASE in the last 168 hours. No results for input(s): AMMONIA in the last 168 hours. CBC: Recent Labs  Lab 09/30/21 1055 09/30/21 1106 09/30/21 2118 10/01/21 0400 10/01/21 1548  WBC 21.3*  --  25.9* 21.9* 19.4*  NEUTROABS 16.9*  --   --  19.5*  --   HGB 10.7*   < > 9.7* 10.2* 10.6*  HCT 34.2*   < > 30.2* 32.0* 34.0*  MCV 92.2  --  91.0 91.7 91.4  PLT 518*  --  390 422* 444*   < > = values in this interval not displayed.    Cardiac Enzymes: No results for input(s): CKTOTAL, CKMB, CKMBINDEX, TROPONINI in the last 168 hours. CBG: Recent Labs  Lab 10/03/21 0746 10/03/21 1113 10/03/21 1600 10/03/21 2109 10/04/21 0739  GLUCAP 195* 119* 149* 190* 141*     Iron Studies: No results for input(s): IRON, TIBC, TRANSFERRIN, FERRITIN in the last 72 hours. Studies/Results: No results found.  Medications: Infusions:    Scheduled Medications:  amLODipine  10 mg Oral Daily   atropine  1 drop Both Eyes BID   calcium acetate  2,001 mg Oral TID  WC   carvedilol  6.25 mg Oral BID WC   dorzolamide-timolol  1 drop Both Eyes BID   furosemide  40 mg Oral Daily   gabapentin  600 mg Oral BID   heparin  5,000 Units Subcutaneous Q8H   hydrALAZINE  100 mg Oral Q8H   insulin aspart  0-5 Units Subcutaneous QHS   insulin aspart  0-6 Units Subcutaneous TID WC   lamoTRIgine  25 mg Oral Daily   pravastatin  80 mg Oral q1800    have reviewed scheduled and prn medications.  Physical Exam: General: Able to lie flat, not in distress Heart:RRR, s1s2 nl Lungs: Clear b/l, no crackle Abdomen:soft, Non-tender, non-distended Extremities:No edema Dialysis Access: Left upper extremity AV fistula has good bruit and thrill.  Tammy Moses Tammy Moses 10/04/2021,10:48 AM  LOS: 3 days

## 2021-10-05 ENCOUNTER — Encounter: Payer: Self-pay | Admitting: *Deleted

## 2021-10-05 LAB — GLUCOSE, CAPILLARY
Glucose-Capillary: 150 mg/dL — ABNORMAL HIGH (ref 70–99)
Glucose-Capillary: 147 mg/dL — ABNORMAL HIGH (ref 70–99)
Glucose-Capillary: 122 mg/dL — ABNORMAL HIGH (ref 70–99)
Glucose-Capillary: 179 mg/dL — ABNORMAL HIGH (ref 70–99)

## 2021-10-05 LAB — RENAL FUNCTION PANEL
Albumin: 2.7 g/dL — ABNORMAL LOW (ref 3.5–5.0)
Anion gap: 14 (ref 5–15)
BUN: 76 mg/dL — ABNORMAL HIGH (ref 6–20)
CO2: 19 mmol/L — ABNORMAL LOW (ref 22–32)
Calcium: 8.9 mg/dL (ref 8.9–10.3)
Chloride: 93 mmol/L — ABNORMAL LOW (ref 98–111)
Creatinine, Ser: 10.46 mg/dL — ABNORMAL HIGH (ref 0.44–1.00)
GFR, Estimated: 4 mL/min — ABNORMAL LOW (ref >60)
Glucose, Bld: 224 mg/dL — ABNORMAL HIGH (ref 70–99)
Phosphorus: 5.1 mg/dL — ABNORMAL HIGH (ref 2.5–4.6)
Potassium: 5 mmol/L (ref 3.5–5.1)
Sodium: 126 mmol/L — ABNORMAL LOW (ref 135–145)

## 2021-10-05 LAB — CBC
HCT: 29.5 % — ABNORMAL LOW (ref 36.0–46.0)
Hemoglobin: 9.3 g/dL — ABNORMAL LOW (ref 12.0–15.0)
MCH: 28.4 pg (ref 26.0–34.0)
MCHC: 31.5 g/dL (ref 30.0–36.0)
MCV: 89.9 fL (ref 80.0–100.0)
Platelets: 282 10*3/uL (ref 150–400)
RBC: 3.28 MIL/uL — ABNORMAL LOW (ref 3.87–5.11)
RDW: 16.2 % — ABNORMAL HIGH (ref 11.5–15.5)
WBC: 9.8 10*3/uL (ref 4.0–10.5)
nRBC: 0 % (ref 0.0–0.2)

## 2021-10-05 MED ORDER — SODIUM CHLORIDE 0.9 % IV SOLN: 100.0000 mL | INTRAVENOUS | Status: DC | PRN

## 2021-10-05 MED ORDER — ALTEPLASE 2 MG IJ SOLR: 2.0000 mg | Freq: Once | INTRAMUSCULAR | Status: DC | PRN

## 2021-10-05 MED ORDER — PENTAFLUOROPROP-TETRAFLUOROETH EX AERO: 1.0000 "application " | INHALATION_SPRAY | CUTANEOUS | Status: DC | PRN

## 2021-10-05 MED ORDER — HYDRALAZINE HCL 50 MG PO TABS
100.0000 mg | ORAL_TABLET | Freq: Three times a day (TID) | ORAL | Status: DC
  Administered 2021-10-05 – 2021-10-09 (×11): 100 mg via ORAL
  Filled 2021-10-05 (×12): qty 2

## 2021-10-05 MED ORDER — LOPERAMIDE HCL 2 MG PO CAPS
2.0000 mg | ORAL_CAPSULE | Freq: Four times a day (QID) | ORAL | Status: DC | PRN
  Administered 2021-10-09: 2 mg via ORAL
  Filled 2021-10-05: qty 1

## 2021-10-05 MED ORDER — LOPERAMIDE HCL 2 MG PO CAPS
4.0000 mg | ORAL_CAPSULE | Freq: Once | ORAL | Status: AC
  Administered 2021-10-05: 4 mg via ORAL
  Filled 2021-10-05: qty 2

## 2021-10-05 MED ORDER — AMLODIPINE BESYLATE 10 MG PO TABS: 10.0000 mg | ORAL_TABLET | Freq: Every day | ORAL | Status: DC

## 2021-10-05 MED ORDER — LIDOCAINE-PRILOCAINE 2.5-2.5 % EX CREA: 1.0000 "application " | TOPICAL_CREAM | CUTANEOUS | Status: DC | PRN

## 2021-10-05 MED ORDER — HEPARIN SODIUM (PORCINE) 1000 UNIT/ML DIALYSIS: 1000.0000 [IU] | INTRAMUSCULAR | Status: DC | PRN

## 2021-10-05 MED ORDER — AMLODIPINE BESYLATE 10 MG PO TABS
10.0000 mg | ORAL_TABLET | Freq: Every day | ORAL | Status: DC
  Administered 2021-10-05 – 2021-10-09 (×5): 10 mg via ORAL
  Filled 2021-10-05 (×5): qty 1

## 2021-10-05 MED ORDER — LIDOCAINE HCL (PF) 1 % IJ SOLN: 5.0000 mL | INTRAMUSCULAR | Status: DC | PRN

## 2021-10-05 NOTE — Progress Notes (Signed)
PT Cancellation Note  Patient Details Name: Tammy Moses MRN: [id number] DOB: 18-Nov-1965   Cancelled Treatment:    Reason Eval/Treat Not Completed: Patient at procedure or test/unavailable; will attempt to see later as schedule permits.    Reginia Naas 10/05/2021, 9:37 AM Magda Kiel, PT Acute Rehabilitation Services HXTAV:[phone] [phone] 10/05/2021

## 2021-10-05 NOTE — Care Management Important Message (Signed)
Important Message  Patient Details  Name: Tammy Moses MRN: [id number] Date of Birth: 06/29/1966   Medicare Important Message Given:  Yes     [name] 10/05/2021, 10:27 AM

## 2021-10-05 NOTE — Progress Notes (Signed)
[facility] ROUNDING NOTE   Subjective:   Interval History: 55 year old lady end-stage renal disease Monday Wednesday Friday dialysis.  Admitted to the hospital with acute pulmonary edema and shortness of breath improved with dialysis and ultrafiltration.  Has poor living situation and therefore needs to be placed in SNF.  Blood pressure 130/80 pulse 73 temperature 98.3 O2 sats 100% room air  Sodium 136 potassium 4.3 chloride 95 CO2 23 BUN 65 creatinine 10 calcium 7.9 phosphorus 6.6 albumin 2.4.  Hemoglobin 10.6     Objective:  Vital signs in last 24 hours:  Temp:  [98.3 F (36.8 C)-98.4 F (36.9 C)] 98.3 F (36.8 C) (12/11 2019) Pulse Rate:  [70-74] 73 (12/11 2019) Resp:  [16-17] 16 (12/11 2019) BP: (135-147)/(41-67) 135/56 (12/11 2019) SpO2:  [99 %-100 %] 100 % (12/11 2019) Weight:  [56.8 kg] 56.8 kg (12/12 0600)  Weight change:  Filed Weights   10/02/21 1345 10/03/21 0423 10/05/21 0600  Weight: 56.6 kg 54.3 kg 56.8 kg    Intake/Output: I/O last 3 completed shifts: In: 240 [P.O.:240] Out: -    Intake/Output this shift:  No intake/output data recorded.  CVS- RRR RS- CTA ABD- BS present soft non-distended EXT- no edema left upper arm AV fistula with thrill and bruit   Basic Metabolic Panel: Recent Labs  Lab 09/30/21 1200 09/30/21 1316 09/30/21 2118 10/01/21 0400 10/02/21 0153  NA 133* 134* 134* 131* 136  K 6.1* 5.8* 4.5 5.0 4.3  CL 100 99 97* 96* 95*  CO2 17* 17* 22 22 23   GLUCOSE 163* 177* 317* 127* 92  BUN 83* 83* 40* 48* 65*  CREATININE 13.70* 13.61* 8.42* 9.17* 10.14*  CALCIUM 8.9 9.0 8.6* 8.5* 7.9*  PHOS  --   --   --   --  6.6*    Liver Function Tests: Recent Labs  Lab 09/30/21 1200 09/30/21 1316 10/01/21 0400 10/02/21 0153  AST 19 23 13*  --   ALT 16 18 10   --   ALKPHOS 64 66 59  --   BILITOT 0.6 0.5 0.5  --   PROT 7.6 7.9 7.2  --   ALBUMIN 3.0* 3.2* 2.6* 2.4*   No results for input(s): LIPASE, AMYLASE in the last  168 hours. No results for input(s): AMMONIA in the last 168 hours.  CBC: Recent Labs  Lab 09/30/21 1055 09/30/21 1106 09/30/21 2118 10/01/21 0400 10/01/21 1548  WBC 21.3*  --  25.9* 21.9* 19.4*  NEUTROABS 16.9*  --   --  19.5*  --   HGB 10.7* 12.2  11.9* 9.7* 10.2* 10.6*  HCT 34.2* 36.0  35.0* 30.2* 32.0* 34.0*  MCV 92.2  --  91.0 91.7 91.4  PLT 518*  --  390 422* 444*    Cardiac Enzymes: No results for input(s): CKTOTAL, CKMB, CKMBINDEX, TROPONINI in the last 168 hours.  BNP: Invalid input(s): POCBNP  CBG: Recent Labs  Lab 10/04/21 0739 10/04/21 1125 10/04/21 1635 10/04/21 2156 10/05/21 0722  GLUCAP 141* 134* 138* 165* 150*    Microbiology: Results for orders placed or performed during the hospital encounter of 09/30/21  Resp Panel by RT-PCR (Flu A&B, Covid) Nasopharyngeal Swab     Status: None   Collection Time: 09/30/21 11:38 AM   Specimen: Nasopharyngeal Swab; Nasopharyngeal(NP) swabs in vial transport medium  Result Value Ref Range Status   SARS Coronavirus 2 by RT PCR NEGATIVE NEGATIVE Final    Comment: (NOTE) SARS-CoV-2 target nucleic acids are NOT DETECTED.  The SARS-CoV-2 RNA  is generally detectable in upper respiratory specimens during the acute phase of infection. The lowest concentration of SARS-CoV-2 viral copies this assay can detect is 138 copies/mL. A negative result does not preclude SARS-Cov-2 infection and should not be used as the sole basis for treatment or other patient management decisions. A negative result may occur with  improper specimen collection/handling, submission of specimen other than nasopharyngeal swab, presence of viral mutation(s) within the areas targeted by this assay, and inadequate number of viral copies(<138 copies/mL). A negative result must be combined with clinical observations, patient history, and epidemiological information. The expected result is Negative.  Fact Sheet for Patients:   EntrepreneurPulse.com.au  Fact Sheet for Healthcare Providers:  IncredibleEmployment.be  This test is no t yet approved or cleared by the [place] FDA and  has been authorized for detection and/or diagnosis of SARS-CoV-2 by FDA under an Emergency Use Authorization (EUA). This EUA will remain  in effect (meaning this test can be used) for the duration of the COVID-19 declaration under Section 564(b)(1) of the Act, 21 U.S.C.section 360bbb-3(b)(1), unless the authorization is terminated  or revoked sooner.       Influenza A by PCR NEGATIVE NEGATIVE Final   Influenza B by PCR NEGATIVE NEGATIVE Final    Comment: (NOTE) The Xpert Xpress SARS-CoV-2/FLU/RSV plus assay is intended as an aid in the diagnosis of influenza from Nasopharyngeal swab specimens and should not be used as a sole basis for treatment. Nasal washings and aspirates are unacceptable for Xpert Xpress SARS-CoV-2/FLU/RSV testing.  Fact Sheet for Patients: EntrepreneurPulse.com.au  Fact Sheet for Healthcare Providers: IncredibleEmployment.be  This test is not yet approved or cleared by the [place] FDA and has been authorized for detection and/or diagnosis of SARS-CoV-2 by FDA under an Emergency Use Authorization (EUA). This EUA will remain in effect (meaning this test can be used) for the duration of the COVID-19 declaration under Section 564(b)(1) of the Act, 21 U.S.C. section 360bbb-3(b)(1), unless the authorization is terminated or revoked.  Performed at [facility], [place] [address] [place], [place] [address]     Coagulation Studies: No results for input(s): LABPROT, INR in the last 72 hours.  Urinalysis: No results for input(s): COLORURINE, LABSPEC, PHURINE, GLUCOSEU, HGBUR, BILIRUBINUR, KETONESUR, PROTEINUR, UROBILINOGEN, NITRITE, LEUKOCYTESUR in the last 72 hours.  Invalid input(s): APPERANCEUR    Imaging: No  results found.   Medications:    sodium chloride     sodium chloride      [START ON 10/06/2021] amLODipine  10 mg Oral Daily   atropine  1 drop Both Eyes BID   calcium acetate  2,001 mg Oral TID WC   carvedilol  6.25 mg Oral BID WC   Chlorhexidine Gluconate Cloth  6 each Topical Q0600   dorzolamide-timolol  1 drop Both Eyes BID   furosemide  40 mg Oral Daily   gabapentin  600 mg Oral BID   heparin  5,000 Units Subcutaneous Q8H   hydrALAZINE  100 mg Oral Q8H   insulin aspart  0-5 Units Subcutaneous QHS   insulin aspart  0-6 Units Subcutaneous TID WC   lamoTRIgine  25 mg Oral Daily   loperamide  4 mg Oral Once   pravastatin  80 mg Oral q1800   sodium chloride, sodium chloride, acetaminophen **OR** acetaminophen, albuterol, alteplase, heparin, lidocaine (PF), lidocaine-prilocaine, loperamide, ondansetron **OR** ondansetron (ZOFRAN) IV, pentafluoroprop-tetrafluoroeth  Assessment/ Plan:  [facility] Monday Wednesday Friday.  Outpatient orders 2-1/2 hours F1 80 2K 2.5 calcium.  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

## 2021-10-05 NOTE — TOC Progression Note (Addendum)
Transition of Care [facility]) - Progression Note    Patient Details  Name: ARICELA BERTAGNOLLI MRN: [id number] Date of Birth: 20-Oct-1966  [facility] CM/SW Tuttle, Anthony Phone Number: 10/05/2021, 3:44 PM  Clinical Narrative:     CSW spoke with patient at bedside. CSW informed patient there are no current SNF bed offers. Patient gave CSW permission to fax out initial referral to [facility] and [facility]. CSW called Bryson Ha with [facility] who is reviewing referral to see if she can make a SNF bed offer. CSW will start insurance authorization close to  patient being medically ready for dc.Patient also agreeable to receive the booster if SNF request for her to have one. CSW informed MD. CSW spoke with renal navigator. CSW will inform renal navigator once SNF bed is secure to help coordinate HD for patient.CSW will continue to follow and assist with patients dc planning needs.  Expected Discharge Plan: Skilled Nursing Facility Barriers to Discharge: Ship broker, Continued Medical Work up, SNF Pending bed offer  Expected Discharge Plan and Services Expected Discharge Plan: [place] arrangements for the past 2 months: Apartment                                       Social Determinants of Health (SDOH) Interventions    Readmission Risk Interventions No flowsheet data found.

## 2021-10-05 NOTE — Consult Note (Signed)
Chief Complaint: Pulling clots from AVF during dialysis. Request is for Fistulogram with possible intervention. Basilic vein-brachial artery AV fistula   Referring Physician(s): D. Zeyfang PA  Supervising Physician: Mir, Sharen Heck  Patient Status: Tammy Moses [facility] - Out-pt  History of Present Illness: Tammy Moses is a 55 y.o. female inpatient. History of chronic leukocytosis, type 2 diabetes, hypertension, anemia chronic disease, COPD, . ESRD on HD. Presented to the ED at [facility] on 2.11.22 today via EMS with hypoxia and respiratory distress, hypertension. She has a basilic vein-brachial artery AV fistula created on 8.31.18. Transposition on 12.20.18. per dialysis RN they are pulling clots. Specifically the arterial needle was pulling air and then clots. She did not get dialysis today. Team is requesting a fistulogram for further evaluation and possible intervention. Patient states that she has had issues with the arterial access for several months now. She states that she has had a prior fistulogram in [facility] with stent placement in 2019  Currently without any significant complaints. Patient alert and laying in bed, calm and comfortable. Denies any fevers, headache, chest pain, SOB, cough, abdominal pain, nausea, vomiting or bleeding. Return precautions and treatment recommendations and follow-up discussed with the patient who is agreeable with the plan.   Past Medical History:  Diagnosis Date   Anemia    Anxiety    Blind right eye    Chronic kidney disease, stage V (HCC)    Constipation, chronic    Dental caries    Diabetes mellitus    Diabetic neuropathy (HCC)    Diabetic retinopathy    GERD (gastroesophageal reflux disease)    Glaucoma    H. pylori infection    Hepatitis C carrier ([place])    High risk sexual behavior    Hyperlipidemia    Hypertension    Hypoglycemia 07/12/2017   Insomnia    Microalbuminuria    Nonspecific elevation of levels of transaminase or lactic acid  dehydrogenase (LDH)    Tobacco dependence    Vitamin D deficiency     Past Surgical History:  Procedure Laterality Date   ABDOMINAL HYSTERECTOMY  04/30/2009   PARTIAL   COLONOSCOPY     DIALYSIS FISTULA CREATION Left 06/2017   ESOPHAGOGASTRODUODENOSCOPY      Allergies: Acyclovir and related  Medications: Prior to Admission medications   Medication Sig Start Date End Date Taking? Authorizing Provider  acetaminophen (TYLENOL) 500 MG tablet Take 1,000 mg by mouth every 6 (six) hours as needed for mild pain.   Yes [provider]  amLODipine (NORVASC) 10 MG tablet TAKE 1 TABLET(10 MG) BY MOUTH DAILY Patient taking differently: Take 10 mg by mouth daily. 02/10/21  Yes [name], FNP  atropine 1 % ophthalmic solution Place 1 drop into both eyes 2 (two) times daily.   Yes [provider]  buPROPion (ZYBAN) 150 MG 12 hr tablet Take 150 mg by mouth daily. 01/16/21  Yes [provider]  calcium acetate (PHOSLO) 667 MG capsule Take 2,001 mg by mouth 3 (three) times daily with meals. 07/06/18  Yes [provider]  dorzolamide-timolol (COSOPT) 22.3-6.8 MG/ML ophthalmic solution Place 1 drop into both eyes 2 (two) times daily. 12/08/20  Yes [provider]  furosemide (LASIX) 40 MG tablet Take 1 tablet (40 mg total) by mouth daily. 12/11/19  Yes [name], FNP  gabapentin (NEURONTIN) 600 MG tablet Take 1 tablet (600 mg total) by mouth 2 (two) times daily. 12/11/19  Yes [name], FNP  hydrALAZINE (APRESOLINE)  100 MG tablet Take 100 mg by mouth 2 (two) times daily. 05/19/20  Yes [provider]  lamoTRIgine (LAMICTAL) 25 MG tablet Take 25 mg by mouth daily. 01/16/21  Yes [provider]  latanoprost (XALATAN) 0.005 % ophthalmic solution Place 1 drop into both eyes at bedtime. 12/16/20  Yes [provider]  metoCLOPramide (REGLAN) 10 MG tablet Take 10 mg by mouth 2 (two) times daily as needed for nausea. 09/22/21   Yes [provider]  albuterol (VENTOLIN HFA) 108 (90 Base) MCG/ACT inhaler Inhale 2 puffs into the lungs every 4 (four) hours as needed for shortness of breath. For shortness of breath 12/11/19   [name], FNP  blood glucose meter kit and supplies KIT Dispense based on patient and insurance preference. Use up to four times daily as directed. (FOR ICD-10 E.11.9) 11/30/17   Scot Jun, FNP  diltiazem 2 % GEL Apply 1 application topically 5 (five) times daily. 06/19/21   Irene Shipper, MD  glucose blood (ONE TOUCH ULTRA TEST) test strip USE TO TEST 3 TIMES A DAY 08/15/20   [name], FNP  Insulin Pen Needle 29G X 12MM MISC E11.9 Dx Code Use once at bedtime with each nightly insulin 03/11/17   Scot Jun, FNP  Lancets [facility] ULTRASOFT) lancets Use as instructed 07/10/19   [name], FNP  pravastatin (PRAVACHOL) 80 MG tablet Take 1 tablet (80 mg total) by mouth daily. 12/11/19   [name], FNP     Family History  Problem Relation Age of Onset   High blood pressure Mother    Diabetes Mother    Thyroid disease Mother    Diabetes Father    High blood pressure Father    Cerebral palsy Daughter    Other Son        still born    Social History   Socioeconomic History   Marital status: Divorced    Spouse name: Not on file   Number of children: 3   Years of education: 11   Highest education level: Not on file  Occupational History    Employer: DISABLED    Comment: Disabled  Tobacco Use   Smoking status: Every Day    Packs/day: 0.25    Types: Cigarettes   Smokeless tobacco: Never   Tobacco comments:    trying to quit  Vaping Use   Vaping Use: Never used  Substance and Sexual Activity   Alcohol use: Yes    Comment: 2-3 beers weekly   Drug use: Not Currently    Types: Marijuana, Cocaine    Comment: quit in 2018   Sexual activity: Yes  Other Topics Concern   Not on file  Social History Narrative   Patient lives at home alone.    Disabled.   Right handed.   Education 11 th grade.   Caffeine three cups of coffee daily.   No psychiatrist   Social Determinants of Health   Financial Resource Strain: Not on file  Food Insecurity: Not on file  Transportation Needs: Not on file  Physical Activity: Not on file  Stress: Not on file  Social Connections: Not on file    Review of Systems: A 12 point ROS discussed and pertinent positives are indicated in the HPI above.  All other systems are negative.  Review of Systems  Constitutional:  Negative for fatigue and fever.  HENT:  Negative for congestion.   Respiratory:  Negative for cough and shortness  of breath.   Gastrointestinal:  Negative for abdominal pain, diarrhea, nausea and vomiting.   Vital Signs: BP (!) 163/56   Pulse 76   Temp 98 F (36.7 C)   Resp 20   Ht 5' 1"  (1.549 m)   Wt 125 lb 14.1 oz (57.1 kg)   SpO2 98%   BMI 23.79 kg/m   Physical Exam Vitals and nursing note reviewed.  Constitutional:      Appearance: She is well-developed.  HENT:     Head: Normocephalic and atraumatic.  Eyes:     Conjunctiva/sclera: Conjunctivae normal.  Cardiovascular:     Rate and Rhythm: Normal rate and regular rhythm.     Comments: RUE AVF +/+ Pulmonary:     Effort: Pulmonary effort is normal.  Musculoskeletal:        General: Normal range of motion.     Cervical back: Normal range of motion.  Skin:    General: Skin is warm.  Neurological:     Mental Status: She is alert and oriented to person, place, and time.    Imaging: DG Chest 2 View  Result Date: 09/20/2021 CLINICAL DATA:  Fever, diabetes, hypertension EXAM: CHEST - 2 VIEW COMPARISON:  09/16/2021 FINDINGS: Enlargement of cardiac silhouette with pulmonary vascular congestion. Atherosclerotic calcification aorta. Lungs clear. No pulmonary infiltrate, pleural effusion, or pneumothorax. Osseous demineralization. IMPRESSION: Enlargement of cardiac silhouette. No acute abnormalities. Aortic  Atherosclerosis (ICD10-I70.0). Electronically Signed   By: Lavonia Dana M.D.   On: 09/20/2021 15:38   CT Angio Chest PE W and/or Wo Contrast  Result Date: 10/01/2021 CLINICAL DATA:  Shortness of breath and chest pain EXAM: CT ANGIOGRAPHY CHEST WITH CONTRAST TECHNIQUE: Multidetector CT imaging of the chest was performed using the standard protocol during bolus administration of intravenous contrast. Multiplanar CT image reconstructions and MIPs were obtained to evaluate the vascular anatomy. CONTRAST:  53m OMNIPAQUE IOHEXOL 350 MG/ML SOLN COMPARISON:  09/30/2021, CT from 05/15/2021 FINDINGS: Cardiovascular: Thoracic aorta and its branches demonstrate atherosclerotic calcifications. No aneurysmal dilatation or dissection is noted. Mild cardiac enlargement is seen. Scattered coronary calcifications are noted. Pulmonary artery is well visualized within normal branching pattern. No filling defect to suggest pulmonary embolism is noted. Mediastinum/Nodes: Thoracic inlet is within normal limits. No sizable hilar or mediastinal adenopathy is noted. The esophagus as visualized is within normal limits. Lungs/Pleura: Lungs are well aerated bilaterally with the exception of bilateral lower lobe atelectatic changes. Mild changes of vascular congestion and central parenchymal edema are seen. No pneumothorax is noted. Small effusions are seen bilaterally. Upper Abdomen: Visualized upper abdomen demonstrates a thrombosed calcified splenic artery aneurysm. No other focal abnormality is seen. Musculoskeletal: Undisplaced right sixth rib fracture is noted anteriorly. Degenerative changes of the thoracic spine are noted. Review of the MIP images confirms the above findings. IMPRESSION: No evidence of pulmonary emboli. Bilateral lower lobe atelectasis. Changes of mild CHF. Displaced right sixth rib fracture without complicating factors. 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 Aortic Atherosclerosis  (ICD10-I70.0). Electronically Signed   By: MInez CatalinaM.D.   On: 10/01/2021 02:59   DG Chest Port 1 View  Result Date: 10/01/2021 CLINICAL DATA:  Shortness of breath EXAM: PORTABLE CHEST 1 VIEW COMPARISON:  Chest radiograph 1 day prior FINDINGS: The heart is enlarged, unchanged. The mediastinal contours are stable. Aeration of the right base has improved. There is no overt pulmonary edema. There is no new or worsening focal airspace disease. The small pleural effusion seen on the prior  CT are not well seen on the current study. There is no enlarging pleural effusion. There is no pneumothorax. IMPRESSION: Improved aeration of the right base compared to the study from 1 day prior. No new or worsening airspace disease. Electronically Signed   By: Valetta Mole M.D.   On: 10/01/2021 08:02   DG Chest Port 1 View  Result Date: 09/30/2021 CLINICAL DATA:  Shortness of breath.  COPD. EXAM: PORTABLE CHEST 1 VIEW COMPARISON:  09/20/2021 FINDINGS: Midline trachea. Cardiomegaly accentuated by AP portable technique. Atherosclerosis in the transverse aorta. No pneumothorax. Suspect trace right pleural fluid. Interstitial edema, as evidenced by septal thickening and interstitial indistinctness. Right greater than left base airspace disease. IMPRESSION: Cardiomegaly with mild interstitial edema. Bibasilar airspace disease. Although this could represent atelectasis, especially in the medial right lung base, concurrent infection is a concern. Possible trace right pleural fluid. Aortic Atherosclerosis (ICD10-I70.0). Electronically Signed   By: Abigail Miyamoto M.D.   On: 09/30/2021 11:14   DG Chest Port 1 View  Result Date: 09/16/2021 CLINICAL DATA:  Sepsis. EXAM: PORTABLE CHEST 1 VIEW COMPARISON:  December 10, 2020. FINDINGS: The heart size and mediastinal contours are within normal limits. Both lungs are clear. The visualized skeletal structures are unremarkable. IMPRESSION: No active disease. Aortic Atherosclerosis  (ICD10-I70.0). Electronically Signed   By: Marijo Conception M.D.   On: 09/16/2021 13:24   DG Foot Complete Right  Result Date: 09/22/2021 CLINICAL DATA:  Injury with pain EXAM: RIGHT FOOT COMPLETE - 3+ VIEW COMPARISON:  None. FINDINGS: No evidence of regional fracture. Mild hallux valgus deformity. Arterial calcification throughout the foot and ankle is visible. IMPRESSION: No acute or traumatic finding. Mild hallux valgus deformity. Arterial calcification. Electronically Signed   By: Nelson Chimes M.D.   On: 09/22/2021 18:53   ECHOCARDIOGRAM COMPLETE  Result Date: 09/18/2021    ECHOCARDIOGRAM REPORT   Patient Name:   Tammy Moses Date of Exam: 09/18/2021 Medical Rec #:  [id number]      Height:       61.0 in Accession #:    [id number]     Weight:       122.4 lb Date of Birth:  22-Dec-1965      BSA:          1.533 m Patient Age:    60 years       BP:           160/66 mmHg Patient Gender: F              HR:           81 bpm. Exam Location:  Inpatient Procedure: 2D Echo, Cardiac Doppler and Color Doppler Indications:    CHF-Acute Systolic  History:        Patient has no prior history of Echocardiogram examinations.                 COPD; Risk Factors:Hypertension and Diabetes.  Sonographer:    Wenda Low Referring Phys: [id number] [place]  1. Left ventricular ejection fraction, by estimation, is 60 to 65%. The left ventricle has normal function. The left ventricle has no regional wall motion abnormalities. There is moderate left ventricular hypertrophy. Left ventricular diastolic parameters are indeterminate.  2. Right ventricular systolic function is normal. The right ventricular size is normal. There is mildly elevated pulmonary artery systolic pressure.  3. The mitral valve is degenerative. Trivial mitral valve regurgitation. No evidence of mitral stenosis. Moderate mitral annular calcification.  4.  The aortic valve is tricuspid. There is moderate calcification of the aortic valve. There  is moderate thickening of the aortic valve. Aortic valve regurgitation is not visualized. Mild aortic valve stenosis.  5. The inferior vena cava is normal in size with greater than 50% respiratory variability, suggesting right atrial pressure of 3 mmHg. FINDINGS  Left Ventricle: Left ventricular ejection fraction, by estimation, is 60 to 65%. The left ventricle has normal function. The left ventricle has no regional wall motion abnormalities. The left ventricular internal cavity size was normal in size. There is  moderate left ventricular hypertrophy. Left ventricular diastolic parameters are indeterminate. Right Ventricle: The right ventricular size is normal. No increase in right ventricular wall thickness. Right ventricular systolic function is normal. There is mildly elevated pulmonary artery systolic pressure. The tricuspid regurgitant velocity is 2.96  m/s, and with an assumed right atrial pressure of 8 mmHg, the estimated right ventricular systolic pressure is 00.9 mmHg. Left Atrium: Left atrial size was normal in size. Right Atrium: Right atrial size was normal in size. Pericardium: There is no evidence of pericardial effusion. Mitral Valve: The mitral valve is degenerative in appearance. There is mild thickening of the mitral valve leaflet(s). There is mild calcification of the mitral valve leaflet(s). Moderate mitral annular calcification. Trivial mitral valve regurgitation. No evidence of mitral valve stenosis. MV peak gradient, 10.8 mmHg. The mean mitral valve gradient is 4.0 mmHg. Tricuspid Valve: The tricuspid valve is normal in structure. Tricuspid valve regurgitation is mild . No evidence of tricuspid stenosis. Aortic Valve: The aortic valve is tricuspid. There is moderate calcification of the aortic valve. There is moderate thickening of the aortic valve. Aortic valve regurgitation is not visualized. Mild aortic stenosis is present. Aortic valve mean gradient measures 10.5 mmHg. Aortic valve peak  gradient measures 20.2 mmHg. Aortic valve area, by VTI measures 1.65 cm. Pulmonic Valve: The pulmonic valve was normal in structure. Pulmonic valve regurgitation is not visualized. No evidence of pulmonic stenosis. Aorta: The aortic root is normal in size and structure. Venous: The inferior vena cava is normal in size with greater than 50% respiratory variability, suggesting right atrial pressure of 3 mmHg. IAS/Shunts: No atrial level shunt detected by color flow Doppler.  LEFT VENTRICLE PLAX 2D LVIDd:         4.80 cm   Diastology LVIDs:         2.90 cm   LV e' medial:    4.68 cm/s LV PW:         1.30 cm   LV E/e' medial:  25.6 LV IVS:        1.50 cm   LV e' lateral:   6.42 cm/s LVOT diam:     1.90 cm   LV E/e' lateral: 18.7 LV SV:         63 LV SV Index:   41 LVOT Area:     2.84 cm  RIGHT VENTRICLE RV Basal diam:  3.15 cm RV Mid diam:    2.60 cm RV S prime:     11.70 cm/s TAPSE (M-mode): 2.0 cm LEFT ATRIUM           Index        RIGHT ATRIUM           Index LA diam:      3.60 cm 2.35 cm/m   RA Area:     15.60 cm LA Vol (A2C): 37.8 ml 24.66 ml/m  RA Volume:   40.00 ml  26.10  ml/m LA Vol (A4C): 54.2 ml 35.36 ml/m  AORTIC VALVE                     PULMONIC VALVE AV Area (Vmax):    1.65 cm      PV Vmax:       0.98 m/s AV Area (Vmean):   1.67 cm      PV Peak grad:  3.9 mmHg AV Area (VTI):     1.65 cm AV Vmax:           224.50 cm/s AV Vmean:          151.000 cm/s AV VTI:            0.380 m AV Peak Grad:      20.2 mmHg AV Mean Grad:      10.5 mmHg LVOT Vmax:         131.00 cm/s LVOT Vmean:        89.000 cm/s LVOT VTI:          0.221 m LVOT/AV VTI ratio: 0.58  AORTA Ao Root diam: 2.80 cm MITRAL VALVE                TRICUSPID VALVE MV Area (PHT): 2.95 cm     TR Peak grad:   35.0 mmHg MV Area VTI:   1.55 cm     TR Vmax:        296.00 cm/s MV Peak grad:  10.8 mmHg MV Mean grad:  4.0 mmHg     SHUNTS MV Vmax:       1.64 m/s     Systemic VTI:  0.22 m MV Vmean:      96.0 cm/s    Systemic Diam: 1.90 cm MV Decel Time:  257 msec MV E velocity: 120.00 cm/s MV A velocity: 134.00 cm/s MV E/A ratio:  0.90 Jenkins Rouge MD Electronically signed by Jenkins Rouge MD Signature Date/Time: 09/18/2021/3:06:24 PM    Final    US Abdomen Limited RUQ (LIVER/GB)  Result Date: 09/21/2021 CLINICAL DATA:  Fever, nausea and vomiting EXAM: ULTRASOUND ABDOMEN LIMITED RIGHT UPPER QUADRANT COMPARISON:  None. FINDINGS: Gallbladder: No gallstones or wall thickening visualized. No sonographic Murphy sign noted by sonographer. Common bile duct: Diameter: 4.5 mm, normal Liver: No focal lesion identified. Within normal limits in parenchymal echogenicity. Portal vein is patent on color Doppler imaging with normal direction of blood flow towards the liver. Other: None. IMPRESSION: Unremarkable right upper quadrant ultrasound. Electronically Signed   By: Macy Mis M.D.   On: 09/21/2021 08:36    Labs:  CBC: Recent Labs    09/30/21 2118 10/01/21 0400 10/01/21 1548 10/05/21 0824  WBC 25.9* 21.9* 19.4* 9.8  HGB 9.7* 10.2* 10.6* 9.3*  HCT 30.2* 32.0* 34.0* 29.5*  PLT 390 422* 444* 282    COAGS: Recent Labs    09/16/21 1242  INR 1.2  APTT 28    BMP: Recent Labs    09/30/21 2118 10/01/21 0400 10/02/21 0153 10/05/21 0824  NA 134* 131* 136 126*  K 4.5 5.0 4.3 5.0  CL 97* 96* 95* 93*  CO2 22 22 23  19*  GLUCOSE 317* 127* 92 224*  BUN 40* 48* 65* 76*  CALCIUM 8.6* 8.5* 7.9* 8.9  CREATININE 8.42* 9.17* 10.14* 10.46*  GFRNONAA 5* 5* 4* 4*    LIVER FUNCTION TESTS: Recent Labs    09/21/21 0248 09/30/21 1200 09/30/21 1316 10/01/21 0400 10/02/21 0153 10/05/21 0824  BILITOT 0.4 0.6 0.5 0.5  --   --  AST 23 19 23  13*  --   --   ALT 12 16 18 10   --   --   ALKPHOS 44 64 66 59  --   --   PROT 6.7 7.6 7.9 7.2  --   --   ALBUMIN 2.5* 3.0* 3.2* 2.6* 2.4* 2.7*      Assessment and Plan:  55 year old  female inpatient. History of chronic leukocytosis, type 2 diabetes, hypertension, anemia chronic disease, COPD, . ESRD on  HD. Presented to the ED at [facility] on 2.11.22 today via EMS with hypoxia and respiratory distress, hypertension. She has a  basilic vein-brachial artery AB fistula created on 8.31.18. tTranspotion on 12.20.18. per dialysis RN they are pulling clots. Specifically the arterial needle was pulling air and then clots. She did not get dialysis today. Team is requesting a fistulogram for further evaluation and possible intervention.   Patient states that she has had issues with the arterial access for several months now. She states that she has had a prior fisulogram in [facility] with stent placement in 2019. No pertinent imaging. No prior fistulograms. BUN 76, Cr 10.46, GFR >4. Sodium 126. Potassium is 5. Patient is on subcutaneous prophylactic dose of heparin. All labs and medications are within acceptable parameters. No pertinent allergies.   IR consulted for possible fistulogram. Case has been reviewed and procedure approved by Dr. Dwaine Gale.  Patient tentatively scheduled for 11.13.22.  Team instructed to: Keep Patient to be NPO after midnight  IR will call patient when ready.  Risks and benefits discussed with the patient including, but not limited to bleeding, infection, vascular injury, pulmonary embolism, need for tunneled HD catheter placement or even death.  All of the patient's questions were answered, patient is agreeable to proceed. Consent signed and in chart.   Thank you for this interesting consult.  I greatly enjoyed meeting Tammy Moses and look forward to participating in their care.  A copy of this report was sent to the requesting provider on this date.  Electronically Signed: Jacqualine Mau, NP 10/05/2021, 1:17 PM   I spent a total of 40 Minutes    in face to face in clinical consultation, greater than 50% of which was counseling/coordinating care for fistulogram possible intervention

## 2021-10-05 NOTE — Procedures (Signed)
Fistula cannulated with blood return noted. Upon initiation of dialysis it was noted that the arterial needle was pulling air into the dialysis line.  Upon inspection of arterial needle it was noted that clots were coming from the arterial needle cannulation site.  Needle was removed and clots were noted at site of cannulation.  Fistula has a positive thrill and bruit.  Justin Mend MD was notified and dialysis was withheld as per his verbal order.  Pt will be sent for a fistulagram at sometime today and we will attempt to perform hemodialysis at a later time.

## 2021-10-05 NOTE — Telephone Encounter (Signed)
This encounter was created in error - please disregard.

## 2021-10-05 NOTE — Progress Notes (Signed)
PROGRESS NOTE                                                                                                                                                                                                             Patient Demographics:    Tammy Moses, is a 55 y.o. female, DOB - 02-13-1966, OBS:[id number]  Outpatient Primary MD for the patient is [name], FNP    LOS - 4  Admit date - 09/30/2021    Chief Complaint  Patient presents with   Shortness of Breath       Brief Narrative (HPI from H&P)  55 year old African-American female with a history of end-stage renal disease needing dialysis on Monday Wednesday Friday with a history of noncompliance, chronic leukocytosis, type 2 diabetes, hypertension, anemia chronic disease, COPD, chronic tobacco abuse presented to the ER today via EMS with hypoxia and respiratory distress, +++ BP.   Subjective:   Patient in bed, appears comfortable, denies any headache, no fever, no chest pain or pressure, no shortness of breath , no abdominal pain. No focal weakness.   Assessment  & Plan :     Acute Hypoxic Resp. Failure due to fluid overload from missed dialysis causing flash pulmonary edema, malignant hypertension - all due to noncompliance with HD regimen and blood pressure medications, was urgently dialyzed 09/30/2021 and now on schedule.  Symptoms have resolved blood pressure medications have been adjusted and blood pressure stable.  Await bed at SNF.  2.  ESRD.  On MWF schedule nephrology on board.  Counseled on compliance  3.  Poorly controlled hypertension.  Noncompliant.  Counseled, medications adjusted further on 10/01/2021 for better control.  4.  Legal blindness.  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  Lives alone, poor living situation, seen by PT OT qualifies for SNF, have requested social work to look for SNF bed.  5.  COPD.  At  baseline.  6.  Bilateral foot and toe pain.  Check uric acid level, trial of colchicine.  7.  Acute on chronic diastolic CHF EF 47% on recent echo.  As in #1 above.    8.  Chronic leukocytosis.  Signs of active infection, outpatient PCP directed work-up.  9. DM type II.  On sliding scale.  Lab Results  Component Value Date   HGBA1C  5.9 (H) 09/30/2021    CBG (last 3)  Recent Labs    10/04/21 1635 10/04/21 2156 10/05/21 0722  GLUCAP 138* 165* 150*         Condition -  Guarded  Family Communication  :  none present  Code Status :  Full  Consults  :  Renal  PUD Prophylaxis :    Procedures  :     CTA - No evidence of pulmonary emboli. Bilateral lower lobe atelectasis. Changes of mild CHF. Displaced right sixth rib fracture without complicating factors. 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 Aortic Atherosclerosis      Disposition Plan  :    Status is: Observation  DVT Prophylaxis  :    heparin injection 5,000 Units Start: 09/30/21 2200 SCDs Start: 09/30/21 2139     Lab Results  Component Value Date   PLT 282 10/05/2021    Diet :  Diet Order             Diet renal/carb modified with fluid restriction Diet-HS Snack? Nothing; Fluid restriction: 1200 mL Fluid; Room service appropriate? Yes; Fluid consistency: Thin  Diet effective now                    Inpatient Medications  Scheduled Meds:  amLODipine  10 mg Oral Daily   atropine  1 drop Both Eyes BID   calcium acetate  2,001 mg Oral TID WC   carvedilol  6.25 mg Oral BID WC   Chlorhexidine Gluconate Cloth  6 each Topical Q0600   dorzolamide-timolol  1 drop Both Eyes BID   furosemide  40 mg Oral Daily   gabapentin  600 mg Oral BID   heparin  5,000 Units Subcutaneous Q8H   hydrALAZINE  100 mg Oral Q8H   insulin aspart  0-5 Units Subcutaneous QHS   insulin aspart  0-6 Units Subcutaneous TID WC   lamoTRIgine  25 mg Oral Daily   pravastatin  80 mg Oral q1800    Continuous Infusions:  sodium chloride     sodium chloride      PRN Meds:.sodium chloride, sodium chloride, acetaminophen **OR** acetaminophen, albuterol, alteplase, heparin, lidocaine (PF), lidocaine-prilocaine, loperamide, ondansetron **OR** ondansetron (ZOFRAN) IV, pentafluoroprop-tetrafluoroeth  Antibiotics  :    Anti-infectives (From admission, onward)    Start     Dose/Rate Route Frequency Ordered Stop   09/30/21 2015  vancomycin (VANCOREADY) IVPB 1250 mg/250 mL        1,250 mg 166.7 mL/hr over 90 Minutes Intravenous  Once 09/30/21 2008 10/01/21 0122   09/30/21 1145  ceFEPIme (MAXIPIME) 2 g in sodium chloride 0.9 % 100 mL IVPB        2 g 200 mL/hr over 30 Minutes Intravenous  Once 09/30/21 1130 09/30/21 2053   09/30/21 1145  vancomycin (VANCOREADY) IVPB 1250 mg/250 mL  Status:  Discontinued        1,250 mg 166.7 mL/hr over 90 Minutes Intravenous  Once 09/30/21 1137 09/30/21 2008        Time Spent in minutes  30   Lala Lund M.D on 10/05/2021 at 10:59 AM  To page go to [email or web address]   [facility] -  Office  [phone]  See all Orders from today for further details    Objective:   Vitals:   10/05/21 0600 10/05/21 0842 10/05/21 0939 10/05/21 1010  BP:  124/67 (!) 160/65 (!) 169/61  Pulse:  76 76   Resp:  16 13 20  Temp:  98.2 F (36.8 C) 98 F (36.7 C) 98 F (36.7 C)  TempSrc:  Oral Oral   SpO2:  98% 98% 98%  Weight: 56.8 kg  57.1 kg   Height:        Wt Readings from Last 3 Encounters:  10/05/21 57.1 kg  09/23/21 54.4 kg  05/15/21 54.4 kg    No intake or output data in the 24 hours ending 10/05/21 1059    Physical Exam  Awake Alert, No new F.N deficits, legally blind, with no vision in the right eye and minimal to blurry vision in the left [place].AT,PERRAL Supple Neck, No JVD,   Symmetrical Chest wall movement, Good air movement bilaterally, CTAB RRR,No Gallops, Rubs or new Murmurs,  +ve B.Sounds, Abd Soft, No tenderness,   No  Cyanosis, Clubbing or edema      Data Review:    CBC Recent Labs  Lab 09/30/21 1055 09/30/21 1106 09/30/21 2118 10/01/21 0400 10/01/21 1548 10/05/21 0824  WBC 21.3*  --  25.9* 21.9* 19.4* 9.8  HGB 10.7* 12.2  11.9* 9.7* 10.2* 10.6* 9.3*  HCT 34.2* 36.0  35.0* 30.2* 32.0* 34.0* 29.5*  PLT 518*  --  390 422* 444* 282  MCV 92.2  --  91.0 91.7 91.4 89.9  MCH 28.8  --  29.2 29.2 28.5 28.4  MCHC 31.3  --  32.1 31.9 31.2 31.5  RDW 16.9*  --  16.8* 17.1* 16.7* 16.2*  LYMPHSABS 1.9  --   --  1.0  --   --   MONOABS 2.0*  --   --  1.2*  --   --   EOSABS 0.3  --   --  0.0  --   --   BASOSABS 0.1  --   --  0.1  --   --     Electrolytes Recent Labs  Lab 09/30/21 1055 09/30/21 1106 09/30/21 1200 09/30/21 1316 09/30/21 2118 10/01/21 0400 10/02/21 0124 10/02/21 0153 10/05/21 0824  NA  --    < > 133* 134* 134* 131*  --  136 126*  K  --    < > 6.1* 5.8* 4.5 5.0  --  4.3 5.0  CL  --    < > 100 99 97* 96*  --  95* 93*  CO2  --    < > 17* 17* 22 22  --  23 19*  GLUCOSE  --    < > 163* 177* 317* 127*  --  92 224*  BUN  --    < > 83* 83* 40* 48*  --  65* 76*  CREATININE  --    < > 13.70* 13.61* 8.42* 9.17*  --  10.14* 10.46*  CALCIUM  --    < > 8.9 9.0 8.6* 8.5*  --  7.9* 8.9  AST  --   --  19 23  --  13*  --   --   --   ALT  --   --  16 18  --  10  --   --   --   ALKPHOS  --   --  64 66  --  59  --   --   --   BILITOT  --   --  0.6 0.5  --  0.5  --   --   --   ALBUMIN  --   --  3.0* 3.2*  --  2.6*  --  2.4* 2.7*  DDIMER 1.76*  --   --   --   --   --   --   --   --  PROCALCITON  --   --  0.68  --   --  2.66 3.09  --   --   HGBA1C  --   --   --   --  5.9*  --   --   --   --   BNP 3,585.7*  --   --   --  4,037.8*  --   --   --   --    < > = values in this interval not displayed.    ------------------------------------------------------------------------------------------------------------------ No results for input(s): CHOL, HDL, LDLCALC, TRIG, CHOLHDL, LDLDIRECT in the last  72 hours.  Lab Results  Component Value Date   HGBA1C 5.9 (H) 09/30/2021     Radiology Reports No results found.

## 2021-10-05 NOTE — Progress Notes (Signed)
Physical Therapy Treatment Patient Details Name: Tammy Moses MRN: [id number] DOB: May 22, 1966 Today's Date: 10/05/2021   History of Present Illness 55 year old African-American female with a history of end-stage renal disease needing dialysis on Monday Wednesday Friday with a history of noncompliance, type 2 diabetes, hypertension, anemia chronic disease, COPD, R eye blindness, diabetic retinopathy, neuropathy, chronic tobacco abuse presented to the ER today via EMS with hypoxia and respiratory distress, +++ BP.    PT Comments    Patient progressing some with ambulation distance though limited today due to R foot pain (pt reports gout).  She is frustrated by her set back with her fistula, but seems eager for opportunities for her niece to get a new bigger place where she can stay with her.  Patient remains appropriate for SNF level rehab at d/c.   Recommendations for follow up therapy are one component of a multi-disciplinary discharge planning process, led by the attending physician.  Recommendations may be updated based on patient status, additional functional criteria and insurance authorization.  Follow Up Recommendations  Skilled nursing-short term rehab (<3 hours/day)     Assistance Recommended at Discharge Intermittent Supervision/Assistance  Equipment Recommendations  None recommended by PT    Recommendations for Other Services       Precautions / Restrictions Precautions Precautions: Fall Precaution Comments: blind, Rt foot drop     Mobility  Bed Mobility         Supine to sit: Modified independent (Device/Increase time);HOB elevated     General bed mobility comments: sits up unaided with bed rails    Transfers Overall transfer level: Needs assistance Equipment used: Rolling walker (2 wheels) Transfers: Sit to/from Stand Sit to Stand: Min guard           General transfer comment: assist for balance    Ambulation/Gait Ambulation/Gait assistance: Min  assist Gait Distance (Feet): 100 Feet Assistive device: Rolling walker (2 wheels) Gait Pattern/deviations: Step-to pattern;Step-through pattern;Decreased stance time - right;Antalgic;Steppage;Decreased dorsiflexion - right       General Gait Details: cues and assist to guide walker for maneuvering in hallway, around doorway and to navigate back to EOB; mild antalgia with R foot pain   Stairs             Wheelchair Mobility    Modified Rankin (Stroke Patients Only)       Balance Overall balance assessment: Needs assistance   Sitting balance-Leahy Scale: Good     Standing balance support: Bilateral upper extremity supported Standing balance-Leahy Scale: Poor Standing balance comment: needs UE support for balance                            Cognition Arousal/Alertness: Awake/alert Behavior During Therapy: WFL for tasks assessed/performed Overall Cognitive Status: No family/caregiver present to determine baseline cognitive functioning                                 General Comments: decreased safety awareness        Exercises General Exercises - Lower Extremity Long Arc Quad: Strengthening;Both;10 reps;Seated Hip Flexion/Marching: AROM;5 reps;Both;Seated Other Exercises Other Exercises: encouraged toe AAROM as tolerated on R foot to help with gout    General Comments        Pertinent Vitals/Pain Faces Pain Scale: Hurts even more Pain Location: R foot Pain Descriptors / Indicators: Sore Pain Intervention(s): Monitored during session;Repositioned    Home  Living                          Prior Function            PT Goals (current goals can now be found in the care plan section) Progress towards PT goals: Progressing toward goals    Frequency    Min 3X/week      PT Plan Current plan remains appropriate    Co-evaluation              AM-PAC PT "6 Clicks" Mobility   Outcome Measure  Help needed  turning from your back to your side while in a flat bed without using bedrails?: None Help needed moving from lying on your back to sitting on the side of a flat bed without using bedrails?: None Help needed moving to and from a bed to a chair (including a wheelchair)?: A Little Help needed standing up from a chair using your arms (e.g., wheelchair or bedside chair)?: A Little Help needed to walk in hospital room?: A Little Help needed climbing 3-5 steps with a railing? : Total 6 Click Score: 18    End of Session Equipment Utilized During Treatment: Gait belt Activity Tolerance: Patient tolerated treatment well Patient left: in bed;with call bell/phone within reach;with bed alarm set (seated EOB)   PT Visit Diagnosis: Difficulty in walking, not elsewhere classified (R26.2);Other symptoms and signs involving the nervous system (R29.898)     Time: 6546-5035 PT Time Calculation (min) (ACUTE ONLY): 23 min  Charges:  $Gait Training: 8-22 mins $Therapeutic Exercise: 8-22 mins                     Tammy Moses, PT Acute Rehabilitation Services [phone] Office:[phone] 10/05/2021    Reginia Naas 10/05/2021, 5:36 PM

## 2021-10-06 ENCOUNTER — Inpatient Hospital Stay (HOSPITAL_COMMUNITY): Payer: Medicare Other

## 2021-10-06 HISTORY — PX: IR FLUORO GUIDE CV LINE RIGHT: IMG2283

## 2021-10-06 HISTORY — PX: IR US GUIDE VASC ACCESS RIGHT: IMG2390

## 2021-10-06 LAB — GLUCOSE, CAPILLARY
Glucose-Capillary: 142 mg/dL — ABNORMAL HIGH (ref 70–99)
Glucose-Capillary: 117 mg/dL — ABNORMAL HIGH (ref 70–99)
Glucose-Capillary: 148 mg/dL — ABNORMAL HIGH (ref 70–99)
Glucose-Capillary: 184 mg/dL — ABNORMAL HIGH (ref 70–99)

## 2021-10-06 LAB — CBC
HCT: 28.6 % — ABNORMAL LOW (ref 36.0–46.0)
Hemoglobin: 9 g/dL — ABNORMAL LOW (ref 12.0–15.0)
MCH: 28.3 pg (ref 26.0–34.0)
MCHC: 31.5 g/dL (ref 30.0–36.0)
MCV: 89.9 fL (ref 80.0–100.0)
Platelets: 278 10*3/uL (ref 150–400)
RBC: 3.18 MIL/uL — ABNORMAL LOW (ref 3.87–5.11)
RDW: 16.3 % — ABNORMAL HIGH (ref 11.5–15.5)
WBC: 10.7 10*3/uL — ABNORMAL HIGH (ref 4.0–10.5)
nRBC: 0 % (ref 0.0–0.2)

## 2021-10-06 LAB — BASIC METABOLIC PANEL
Anion gap: 16 — ABNORMAL HIGH (ref 5–15)
BUN: 92 mg/dL — ABNORMAL HIGH (ref 6–20)
CO2: 18 mmol/L — ABNORMAL LOW (ref 22–32)
Calcium: 9.2 mg/dL (ref 8.9–10.3)
Chloride: 91 mmol/L — ABNORMAL LOW (ref 98–111)
Creatinine, Ser: 12.45 mg/dL — ABNORMAL HIGH (ref 0.44–1.00)
GFR, Estimated: 3 mL/min — ABNORMAL LOW (ref >60)
Glucose, Bld: 94 mg/dL (ref 70–99)
Potassium: 5.5 mmol/L — ABNORMAL HIGH (ref 3.5–5.1)
Sodium: 125 mmol/L — ABNORMAL LOW (ref 135–145)

## 2021-10-06 LAB — HEPATITIS B CORE ANTIBODY, TOTAL: Hep B Core Total Ab: REACTIVE — AB

## 2021-10-06 MED ORDER — SODIUM ZIRCONIUM CYCLOSILICATE 10 G PO PACK
10.0000 g | PACK | Freq: Three times a day (TID) | ORAL | Status: AC
  Administered 2021-10-06 (×2): 10 g via ORAL
  Filled 2021-10-06 (×2): qty 1

## 2021-10-06 MED ORDER — LIDOCAINE HCL 1 % IJ SOLN
INTRAMUSCULAR | Status: AC
  Administered 2021-10-06: 10 mL
  Filled 2021-10-06: qty 20

## 2021-10-06 MED ORDER — HEPARIN SODIUM (PORCINE) 1000 UNIT/ML IJ SOLN
INTRAMUSCULAR | Status: AC
  Administered 2021-10-06: 2400 [IU]
  Filled 2021-10-06: qty 10

## 2021-10-06 NOTE — Progress Notes (Signed)
PROGRESS NOTE                                                                                                                                                                                                             Patient Demographics:    Tammy Moses, is a 55 y.o. female, DOB - 1966-07-11, OIZ:[id number]  Outpatient Primary MD for the patient is [name], FNP    LOS - 5  Admit date - 09/30/2021    Chief Complaint  Patient presents with   Shortness of Breath       Brief Narrative (HPI from H&P)  55 year old African-American female with a history of end-stage renal disease needing dialysis on Monday Wednesday Friday with a history of noncompliance, chronic leukocytosis, type 2 diabetes, hypertension, anemia chronic disease, COPD, chronic tobacco abuse presented to the ER today via EMS with hypoxia and respiratory distress, +++ BP.   Subjective:   Patient in bed, appears comfortable, denies any headache, no fever, no chest pain or pressure, no shortness of breath , no abdominal pain. No new focal weakness.   Assessment  & Plan :     Acute Hypoxic Resp. Failure due to fluid overload from missed dialysis causing flash pulmonary edema, malignant hypertension - all due to noncompliance with HD regimen and blood pressure medications, was urgently dialyzed 09/30/2021 and now on schedule.  Symptoms have resolved blood pressure medications have been adjusted and blood pressure stable.  Await bed at SNF.  2.  ESRD.  On MWF schedule nephrology on board.  Counseled on compliance, her access has clotted off nephrology has consulted IR on 10/06/2021 for declotting there after dialysis on 10/06/2021  3.  Poorly controlled hypertension.  Noncompliant.  Counseled, medications adjusted further on 10/01/2021 for better control.  4.  Legal blindness.  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  Lives alone, poor  living situation, seen by PT OT qualifies for SNF, have requested social work to look for SNF bed.  5.  COPD.  At baseline.  6.  Bilateral foot and toe pain.  Check uric acid level, trial of colchicine.  7.  Acute on chronic diastolic CHF EF 33% on recent echo.  As in #1 above.    8.  Chronic leukocytosis.  Signs of active infection, outpatient PCP directed work-up.  9. DM type II.  On sliding scale.  Lab Results  Component Value Date   HGBA1C 5.9 (H) 09/30/2021    CBG (last 3)  Recent Labs    10/05/21 1635 10/05/21 2133 10/06/21 0722  GLUCAP 122* 179* 142*         Condition -  Guarded  Family Communication  :  none present  Code Status :  Full  Consults  :  Renal, IR consulted by nephrology  PUD Prophylaxis :    Procedures  :     CTA - No evidence of pulmonary emboli. Bilateral lower lobe atelectasis. Changes of mild CHF. Displaced right sixth rib fracture without complicating factors. 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 Aortic Atherosclerosis      Disposition Plan  :    Status is: Observation  DVT Prophylaxis  :    heparin injection 5,000 Units Start: 09/30/21 2200 SCDs Start: 09/30/21 2139     Lab Results  Component Value Date   PLT 278 10/06/2021    Diet :  Diet Order             Diet NPO time specified Except for: Sips with Meds  Diet effective midnight                    Inpatient Medications  Scheduled Meds:  amLODipine  10 mg Oral Daily   atropine  1 drop Both Eyes BID   calcium acetate  2,001 mg Oral TID WC   carvedilol  6.25 mg Oral BID WC   Chlorhexidine Gluconate Cloth  6 each Topical Q0600   dorzolamide-timolol  1 drop Both Eyes BID   furosemide  40 mg Oral Daily   gabapentin  600 mg Oral BID   heparin  5,000 Units Subcutaneous Q8H   hydrALAZINE  100 mg Oral Q8H   insulin aspart  0-5 Units Subcutaneous QHS   insulin aspart  0-6 Units Subcutaneous TID WC   lamoTRIgine  25 mg Oral  Daily   pravastatin  80 mg Oral q1800   sodium zirconium cyclosilicate  10 g Oral TID   Continuous Infusions:  sodium chloride     sodium chloride      PRN Meds:.sodium chloride, sodium chloride, acetaminophen **OR** acetaminophen, albuterol, alteplase, heparin, lidocaine (PF), lidocaine-prilocaine, loperamide, ondansetron **OR** ondansetron (ZOFRAN) IV, pentafluoroprop-tetrafluoroeth  Antibiotics  :    Anti-infectives (From admission, onward)    Start     Dose/Rate Route Frequency Ordered Stop   09/30/21 2015  vancomycin (VANCOREADY) IVPB 1250 mg/250 mL        1,250 mg 166.7 mL/hr over 90 Minutes Intravenous  Once 09/30/21 2008 10/01/21 0122   09/30/21 1145  ceFEPIme (MAXIPIME) 2 g in sodium chloride 0.9 % 100 mL IVPB        2 g 200 mL/hr over 30 Minutes Intravenous  Once 09/30/21 1130 09/30/21 2053   09/30/21 1145  vancomycin (VANCOREADY) IVPB 1250 mg/250 mL  Status:  Discontinued        1,250 mg 166.7 mL/hr over 90 Minutes Intravenous  Once 09/30/21 1137 09/30/21 2008        Time Spent in minutes  30   Lala Lund M.D on 10/06/2021 at 10:40 AM  To page go to [email or web address]   [facility] -  Office  [phone]  See all Orders from today for further details    Objective:   Vitals:   10/05/21 2016 10/05/21 2100 10/06/21 0500 10/06/21 8295  BP: (!) 150/54  (!) 160/51 (!) 139/45  Pulse:  73 70 74  Resp:  18 18 18   Temp:  98.2 F (36.8 C) 97.7 F (36.5 C) 98 F (36.7 C)  TempSrc:  Oral Oral Oral  SpO2:  97% 98% 99%  Weight:   59.2 kg   Height:        Wt Readings from Last 3 Encounters:  10/06/21 59.2 kg  09/23/21 54.4 kg  05/15/21 54.4 kg     Intake/Output Summary (Last 24 hours) at 10/06/2021 1040 Last data filed at 10/06/2021 0800 Gross per 24 hour  Intake 60 ml  Output --  Net 60 ml      Physical Exam  Awake Alert, No new F.N deficits, legally blind, with no vision in the right eye and minimal to blurry vision in the  left [place].AT,PERRAL Supple Neck, No JVD,   Symmetrical Chest wall movement, Good air movement bilaterally, few crackles RRR,No Gallops, Rubs or new Murmurs,  +ve B.Sounds, Abd Soft, No tenderness,   No Cyanosis, Clubbing or edema      Data Review:    CBC Recent Labs  Lab 09/30/21 1055 09/30/21 1106 09/30/21 2118 10/01/21 0400 10/01/21 1548 10/05/21 0824 10/06/21 0303  WBC 21.3*  --  25.9* 21.9* 19.4* 9.8 10.7*  HGB 10.7*   < > 9.7* 10.2* 10.6* 9.3* 9.0*  HCT 34.2*   < > 30.2* 32.0* 34.0* 29.5* 28.6*  PLT 518*  --  390 422* 444* 282 278  MCV 92.2  --  91.0 91.7 91.4 89.9 89.9  MCH 28.8  --  29.2 29.2 28.5 28.4 28.3  MCHC 31.3  --  32.1 31.9 31.2 31.5 31.5  RDW 16.9*  --  16.8* 17.1* 16.7* 16.2* 16.3*  LYMPHSABS 1.9  --   --  1.0  --   --   --   MONOABS 2.0*  --   --  1.2*  --   --   --   EOSABS 0.3  --   --  0.0  --   --   --   BASOSABS 0.1  --   --  0.1  --   --   --    < > = values in this interval not displayed.    Electrolytes Recent Labs  Lab 09/30/21 1055 09/30/21 1106 09/30/21 1200 09/30/21 1316 09/30/21 2118 10/01/21 0400 10/02/21 0124 10/02/21 0153 10/05/21 0824 10/06/21 0303  NA  --    < > 133* 134* 134* 131*  --  136 126* 125*  K  --    < > 6.1* 5.8* 4.5 5.0  --  4.3 5.0 5.5*  CL  --    < > 100 99 97* 96*  --  95* 93* 91*  CO2  --    < > 17* 17* 22 22  --  23 19* 18*  GLUCOSE  --    < > 163* 177* 317* 127*  --  92 224* 94  BUN  --    < > 83* 83* 40* 48*  --  65* 76* 92*  CREATININE  --    < > 13.70* 13.61* 8.42* 9.17*  --  10.14* 10.46* 12.45*  CALCIUM  --    < > 8.9 9.0 8.6* 8.5*  --  7.9* 8.9 9.2  AST  --   --  19 23  --  13*  --   --   --   --   ALT  --   --  16 18  --  10  --   --   --   --   ALKPHOS  --   --  64 66  --  59  --   --   --   --   BILITOT  --   --  0.6 0.5  --  0.5  --   --   --   --   ALBUMIN  --   --  3.0* 3.2*  --  2.6*  --  2.4* 2.7*  --   DDIMER 1.76*  --   --   --   --   --   --   --   --   --   PROCALCITON  --   --   0.68  --   --  2.66 3.09  --   --   --   HGBA1C  --   --   --   --  5.9*  --   --   --   --   --   BNP 3,585.7*  --   --   --  4,037.8*  --   --   --   --   --    < > = values in this interval not displayed.    ------------------------------------------------------------------------------------------------------------------ No results for input(s): CHOL, HDL, LDLCALC, TRIG, CHOLHDL, LDLDIRECT in the last 72 hours.  Lab Results  Component Value Date   HGBA1C 5.9 (H) 09/30/2021     Radiology Reports No results found.

## 2021-10-06 NOTE — Progress Notes (Signed)
Received message from RN, patient is agitated due to NPO status.   Patient is scheduled for fistulogram with IR pending IR schedule; however, unsure if IR will be able to accommodate the procedure today.   Received message from attending MD Dr. Candiss Norse, decision was made to d/c NPO status and schedule the patient for temp cath placement today.   IR team notified, will call for the patient when ready.   Latonya Knight H Emelynn Rance PA-C 10/06/2021 1:21 PM

## 2021-10-06 NOTE — Progress Notes (Addendum)
Contacted by CSW this am with request to change pt's HD clinic due to snf placement. Pt has been accepted at [facility] and snf transports to [facility]. Referral faxed to [facility] this am. [facility] requires a Hep B Total Core antibody lab for new admissions. Contacted renal MD to request that lab be ordered today. Will follow and assist.   Melven Sartorius Renal Navigator [phone]   Addendum at 4:00 pm: Out-pt HD clinic will require covid test prior to pt's d/c.

## 2021-10-06 NOTE — Progress Notes (Signed)
Spoke with Miss Niznik in IR when she came down for temporary dialysis catheter placement.   Informed the patient the importance of fistulogram with possible intervention, the longer we wait, the higher chance that IR may not be able to salvage the fistula.   Asked Miss Kahan if she is wiling to be NPO at [facility] again, with possibility that she may remain NPO all day tomorrow.  Also discussed possibility of IR not be able to accommodate the patient tomorrow after patient being NPO all day.  Patient verbalized understanding, agreeable to be NPO at [facility] for possible fistulogram with possible intervention tomorrow.   Attending MD, nephrology, and RN notified via secure chat.  Please call IR for questions and concerns.   Armando Gang Sreekar Broyhill PA-C 10/06/2021 2:51 PM

## 2021-10-06 NOTE — TOC Progression Note (Addendum)
Transition of Care [facility]) - Progression Note    Patient Details  Name: Tammy Moses MRN: [id number] Date of Birth: 05/30/1966  Transition of Care [facility]) CM/SW [place], [place] Phone Number: 10/06/2021, 2:49 PM  Clinical Narrative:     CSW provided patient with SNF bed offers. Patient chose SNF placement at [facility]. Bryson Ha with [facility] confirmed patient will have SNF bed once insurance authorization approved and HD center secured. CSW informed Renal Navigator Olivia Mackie. Olivia Mackie is working to get patient an HD center temporarily at [facility]. CSW informed patient.CSW will start insurance authorization close to patient being medically ready.CSW will continue to follow and assist with patients dc planning needs.  Expected Discharge Plan: Skilled Nursing Facility Barriers to Discharge: Ship broker, Continued Medical Work up, SNF Pending bed offer  Expected Discharge Plan and Services Expected Discharge Plan: [facility] arrangements for the past 2 months: Apartment                                       Social Determinants of Health (SDOH) Interventions    Readmission Risk Interventions No flowsheet data found.

## 2021-10-06 NOTE — Progress Notes (Signed)
[facility] ROUNDING NOTE   Subjective:   Interval History: 56 year old lady end-stage renal disease Monday Wednesday Friday dialysis.  Admitted to the hospital with acute pulmonary edema and shortness of breath improved with dialysis and ultrafiltration.  Has poor living situation and therefore needs to be placed in SNF.  Clotted dialysis access 10/05/2021.  Discussed with interventional radiology  Blood pressure 160/51 pulse 70 temperature 97.7 O2 sats 98% room  Sodium 125 potassium 5.5 chloride 91 CO2 18 BUN 19 creatinine 12.45 glucose 94 hemoglobin 9     Objective:  Vital signs in last 24 hours:  Temp:  [97.7 F (36.5 C)-98.2 F (36.8 C)] 97.7 F (36.5 C) (12/13 0500) Pulse Rate:  [70-77] 70 (12/13 0500) Resp:  [13-20] 18 (12/13 0500) BP: (124-169)/(51-67) 160/51 (12/13 0500) SpO2:  [97 %-98 %] 98 % (12/13 0500) Weight:  [57.1 kg-59.2 kg] 59.2 kg (12/13 0500)  Weight change: 0.264 kg Filed Weights   10/05/21 0600 10/05/21 0939 10/06/21 0500  Weight: 56.8 kg 57.1 kg 59.2 kg    Intake/Output: No intake/output data recorded.   Intake/Output this shift:  No intake/output data recorded.  CVS- RRR RS- CTA ABD- BS present soft non-distended EXT- no edema left upper arm AV fistula     Basic Metabolic Panel: Recent Labs  Lab 09/30/21 2118 10/01/21 0400 10/02/21 0153 10/05/21 0824 10/06/21 0303  NA 134* 131* 136 126* 125*  K 4.5 5.0 4.3 5.0 5.5*  CL 97* 96* 95* 93* 91*  CO2 22 22 23  19* 18*  GLUCOSE 317* 127* 92 224* 94  BUN 40* 48* 65* 76* 92*  CREATININE 8.42* 9.17* 10.14* 10.46* 12.45*  CALCIUM 8.6* 8.5* 7.9* 8.9 9.2  PHOS  --   --  6.6* 5.1*  --      Liver Function Tests: Recent Labs  Lab 09/30/21 1200 09/30/21 1316 10/01/21 0400 10/02/21 0153 10/05/21 0824  AST 19 23 13*  --   --   ALT 16 18 10   --   --   ALKPHOS 64 66 59  --   --   BILITOT 0.6 0.5 0.5  --   --   PROT 7.6 7.9 7.2  --   --   ALBUMIN 3.0* 3.2* 2.6* 2.4* 2.7*     No results for input(s): LIPASE, AMYLASE in the last 168 hours. No results for input(s): AMMONIA in the last 168 hours.  CBC: Recent Labs  Lab 09/30/21 1055 09/30/21 1106 09/30/21 2118 10/01/21 0400 10/01/21 1548 10/05/21 0824 10/06/21 0303  WBC 21.3*  --  25.9* 21.9* 19.4* 9.8 10.7*  NEUTROABS 16.9*  --   --  19.5*  --   --   --   HGB 10.7*   < > 9.7* 10.2* 10.6* 9.3* 9.0*  HCT 34.2*   < > 30.2* 32.0* 34.0* 29.5* 28.6*  MCV 92.2  --  91.0 91.7 91.4 89.9 89.9  PLT 518*  --  390 422* 444* 282 278   < > = values in this interval not displayed.     Cardiac Enzymes: No results for input(s): CKTOTAL, CKMB, CKMBINDEX, TROPONINI in the last 168 hours.  BNP: Invalid input(s): POCBNP  CBG: Recent Labs  Lab 10/05/21 0722 10/05/21 1116 10/05/21 1635 10/05/21 2133 10/06/21 0722  GLUCAP 150* 147* 122* 179* 142*     Microbiology: Results for orders placed or performed during the hospital encounter of 09/30/21  Resp Panel by RT-PCR (Flu A&B, Covid) Nasopharyngeal Swab     Status: None  Collection Time: 09/30/21 11:38 AM   Specimen: Nasopharyngeal Swab; Nasopharyngeal(NP) swabs in vial transport medium  Result Value Ref Range Status   SARS Coronavirus 2 by RT PCR NEGATIVE NEGATIVE Final    Comment: (NOTE) SARS-CoV-2 target nucleic acids are NOT DETECTED.  The SARS-CoV-2 RNA is generally detectable in upper respiratory specimens during the acute phase of infection. The lowest concentration of SARS-CoV-2 viral copies this assay can detect is 138 copies/mL. A negative result does not preclude SARS-Cov-2 infection and should not be used as the sole basis for treatment or other patient management decisions. A negative result may occur with  improper specimen collection/handling, submission of specimen other than nasopharyngeal swab, presence of viral mutation(s) within the areas targeted by this assay, and inadequate number of viral copies(<138 copies/mL). A negative  result must be combined with clinical observations, patient history, and epidemiological information. The expected result is Negative.  Fact Sheet for Patients:  EntrepreneurPulse.com.au  Fact Sheet for Healthcare Providers:  IncredibleEmployment.be  This test is no t yet approved or cleared by the [place] FDA and  has been authorized for detection and/or diagnosis of SARS-CoV-2 by FDA under an Emergency Use Authorization (EUA). This EUA will remain  in effect (meaning this test can be used) for the duration of the COVID-19 declaration under Section 564(b)(1) of the Act, 21 U.S.C.section 360bbb-3(b)(1), unless the authorization is terminated  or revoked sooner.       Influenza A by PCR NEGATIVE NEGATIVE Final   Influenza B by PCR NEGATIVE NEGATIVE Final    Comment: (NOTE) The Xpert Xpress SARS-CoV-2/FLU/RSV plus assay is intended as an aid in the diagnosis of influenza from Nasopharyngeal swab specimens and should not be used as a sole basis for treatment. Nasal washings and aspirates are unacceptable for Xpert Xpress SARS-CoV-2/FLU/RSV testing.  Fact Sheet for Patients: EntrepreneurPulse.com.au  Fact Sheet for Healthcare Providers: IncredibleEmployment.be  This test is not yet approved or cleared by the [place] FDA and has been authorized for detection and/or diagnosis of SARS-CoV-2 by FDA under an Emergency Use Authorization (EUA). This EUA will remain in effect (meaning this test can be used) for the duration of the COVID-19 declaration under Section 564(b)(1) of the Act, 21 U.S.C. section 360bbb-3(b)(1), unless the authorization is terminated or revoked.  Performed at [facility], [place] [address] [place], [place] [address]     Coagulation Studies: No results for input(s): LABPROT, INR in the last 72 hours.  Urinalysis: No results for input(s): COLORURINE, LABSPEC, PHURINE,  GLUCOSEU, HGBUR, BILIRUBINUR, KETONESUR, PROTEINUR, UROBILINOGEN, NITRITE, LEUKOCYTESUR in the last 72 hours.  Invalid input(s): APPERANCEUR    Imaging: No results found.   Medications:    sodium chloride     sodium chloride      amLODipine  10 mg Oral Daily   atropine  1 drop Both Eyes BID   calcium acetate  2,001 mg Oral TID WC   carvedilol  6.25 mg Oral BID WC   Chlorhexidine Gluconate Cloth  6 each Topical Q0600   dorzolamide-timolol  1 drop Both Eyes BID   furosemide  40 mg Oral Daily   gabapentin  600 mg Oral BID   heparin  5,000 Units Subcutaneous Q8H   hydrALAZINE  100 mg Oral Q8H   insulin aspart  0-5 Units Subcutaneous QHS   insulin aspart  0-6 Units Subcutaneous TID WC   lamoTRIgine  25 mg Oral Daily   pravastatin  80 mg Oral q1800   sodium chloride,  sodium chloride, acetaminophen **OR** acetaminophen, albuterol, alteplase, heparin, lidocaine (PF), lidocaine-prilocaine, loperamide, ondansetron **OR** ondansetron (ZOFRAN) IV, pentafluoroprop-tetrafluoroeth  Assessment/ Plan:  [facility] dialysis Monday Wednesday Friday.  Outpatient orders 2-1/2 hours F1 80 2K 2.5 calcium.  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  Fistulogram clotted access.  Scheduled for declot 10/06/2021 Hyperkalemia we will give Lokelma until we can schedule dialysis. Awaiting nursing home placement   LOS: Perry @TODAY @7 :40 AM

## 2021-10-06 NOTE — Progress Notes (Signed)
Occupational Therapy Treatment Patient Details Name: Tammy Moses MRN: [id number] DOB: [birth year] Today's Date: 10/06/2021   History of present illness 55 year old African-American female with a history of end-stage renal disease needing dialysis on Monday Wednesday Friday with a history of noncompliance, type 2 diabetes, hypertension, anemia chronic disease, COPD, R eye blindness, diabetic retinopathy, neuropathy, chronic tobacco abuse presented to the ER today via EMS with hypoxia and respiratory distress, +++ BP.   OT comments  Pt making good progress with functional goals. Session focused on grooming, UB and LB ADLs seated, SPTs to [facility], toileitng tasks, activity tolerance seated EOB. Pt eager to d/c to SNF for ST rehab. OT will continue to follow acutely to maximize level of function and safety   Recommendations for follow up therapy are one component of a multi-disciplinary discharge planning process, led by the attending physician.  Recommendations may be updated based on patient status, additional functional criteria and insurance authorization.    Follow Up Recommendations  Skilled nursing-short term rehab (<3 hours/day)    Assistance Recommended at Discharge Frequent or constant Supervision/Assistance  Equipment Recommendations  Other (comment) (TBD at SNF)    Recommendations for Other Services      Precautions / Restrictions Precautions Precautions: Fall Precaution Comments: blind, Rt foot drop Restrictions Weight Bearing Restrictions: No       Mobility Bed Mobility Overal bed mobility: Needs Assistance Bed Mobility: Supine to Sit;Sit to Supine     Supine to sit: Modified independent (Device/Increase time);HOB elevated Sit to supine: Modified independent (Device/Increase time);Supervision   General bed mobility comments: sits up unaided with bed rails    Transfers Overall transfer level: Needs assistance Equipment used: Rolling walker (2 wheels) Transfers:  Sit to/from Stand Sit to Stand: Min guard                 Balance Overall balance assessment: Needs assistance Sitting-balance support: No upper extremity supported;Feet supported Sitting balance-Leahy Scale: Good     Standing balance support: Bilateral upper extremity supported;Single extremity supported Standing balance-Leahy Scale: Poor                             ADL either performed or assessed with clinical judgement   ADL Overall ADL's : Needs assistance/impaired     Grooming: Min guard;Wash/dry face;Wash/dry hands;Standing   Upper Body Bathing: Set up;Supervision/ safety;Sitting Upper Body Bathing Details (indicate cue type and reason): simulated seated EOB Lower Body Bathing: Sitting/lateral leans;Sit to/from stand;Minimal assistance Lower Body Bathing Details (indicate cue type and reason): simulated seated EOB Upper Body Dressing : Supervision/safety;Set up;Sitting       Toilet Transfer: Minimal assistance;Min guard;Cueing for safety;Stand-pivot;BSC/3in1   Toileting- Clothing Manipulation and Hygiene: Sit to/from stand;Sitting/lateral lean;Min guard       Functional mobility during ADLs: Minimal assistance;Min guard;Cueing for safety      Extremity/Trunk Assessment Upper Extremity Assessment Upper Extremity Assessment: Generalized weakness   Lower Extremity Assessment Lower Extremity Assessment: Defer to PT evaluation   Cervical / Trunk Assessment Cervical / Trunk Assessment: Normal    Vision Baseline Vision/History: 2 Legally blind Ability to See in Adequate Light: 3 Highly impaired Patient Visual Report: No change from baseline Vision Assessment?: Vision impaired- to be further tested in functional context   Perception     Praxis      Cognition Arousal/Alertness: Awake/alert Behavior During Therapy: WFL for tasks assessed/performed Overall Cognitive Status: No family/caregiver present to determine baseline cognitive  functioning  General Comments: decreased safety awareness          Exercises     Shoulder Instructions       General Comments      Pertinent Vitals/ Pain       Pain Assessment: Faces Faces Pain Scale: Hurts little more Pain Location: R foot Pain Descriptors / Indicators: Sore Pain Intervention(s): Monitored during session;Repositioned  Home Living                                          Prior Functioning/Environment              Frequency  Min 2X/week        Progress Toward Goals  OT Goals(current goals can now be found in the care plan section)  Progress towards OT goals: Progressing toward goals     Plan Discharge plan remains appropriate;Frequency remains appropriate    Co-evaluation                 AM-PAC OT "6 Clicks" Daily Activity     Outcome Measure   Help from another person eating meals?: None Help from another person taking care of personal grooming?: A Little Help from another person toileting, which includes using toliet, bedpan, or urinal?: A Little Help from another person bathing (including washing, rinsing, drying)?: A Little Help from another person to put on and taking off regular upper body clothing?: A Little Help from another person to put on and taking off regular lower body clothing?: A Lot 6 Click Score: 18    End of Session Equipment Utilized During Treatment: Gait belt;Rolling walker (2 wheels);Other (comment) (BSC)  OT Visit Diagnosis: Unsteadiness on feet (R26.81);Other abnormalities of gait and mobility (R26.89);Muscle weakness (generalized) (M62.81)   Activity Tolerance Patient tolerated treatment well   Patient Left in bed;with call bell/phone within reach   Nurse Communication          Time: 8832-5498 OT Time Calculation (min): 19 min  Charges: OT General Charges $OT Visit: 1 Visit OT Treatments $Self Care/Home Management : 8-22  mins   Emmit Alexanders [facility] 10/06/2021, 2:04 PM

## 2021-10-07 ENCOUNTER — Inpatient Hospital Stay (HOSPITAL_COMMUNITY): Payer: Medicare Other

## 2021-10-07 HISTORY — PX: IR AV DIALY SHUNT INTRO NEEDLE/INTRACATH INITIAL W/PTA/IMG RIGHT: IMG6116

## 2021-10-07 LAB — GLUCOSE, CAPILLARY
Glucose-Capillary: 140 mg/dL — ABNORMAL HIGH (ref 70–99)
Glucose-Capillary: 119 mg/dL — ABNORMAL HIGH (ref 70–99)
Glucose-Capillary: 108 mg/dL — ABNORMAL HIGH (ref 70–99)
Glucose-Capillary: 144 mg/dL — ABNORMAL HIGH (ref 70–99)
Glucose-Capillary: 159 mg/dL — ABNORMAL HIGH (ref 70–99)

## 2021-10-07 LAB — RESP PANEL BY RT-PCR (FLU A&B, COVID) ARPGX2
Influenza A by PCR: NEGATIVE
Influenza B by PCR: NEGATIVE
SARS Coronavirus 2 by RT PCR: NEGATIVE

## 2021-10-07 MED ORDER — LIDOCAINE HCL 1 % IJ SOLN
INTRAMUSCULAR | Status: AC
  Filled 2021-10-07: qty 20

## 2021-10-07 MED ORDER — OXYCODONE HCL 5 MG PO TABS
5.0000 mg | ORAL_TABLET | Freq: Four times a day (QID) | ORAL | Status: DC | PRN
  Administered 2021-10-09 (×2): 5 mg via ORAL
  Filled 2021-10-07 (×2): qty 1

## 2021-10-07 MED ORDER — FENTANYL CITRATE (PF) 100 MCG/2ML IJ SOLN
INTRAMUSCULAR | Status: AC
  Filled 2021-10-07: qty 2

## 2021-10-07 MED ORDER — MIDAZOLAM HCL 2 MG/2ML IJ SOLN
INTRAMUSCULAR | Status: AC | PRN
  Administered 2021-10-07: .5 mg via INTRAVENOUS

## 2021-10-07 MED ORDER — IOHEXOL 300 MG/ML  SOLN
100.0000 mL | Freq: Once | INTRAMUSCULAR | Status: AC | PRN
  Administered 2021-10-07: 11:00:00 50 mL via INTRAVENOUS

## 2021-10-07 MED ORDER — DICLOFENAC SODIUM 1 % EX GEL
2.0000 g | Freq: Four times a day (QID) | CUTANEOUS | Status: DC
  Administered 2021-10-07 – 2021-10-09 (×7): 2 g via TOPICAL
  Filled 2021-10-07: qty 100

## 2021-10-07 MED ORDER — MIDAZOLAM HCL 2 MG/2ML IJ SOLN
INTRAMUSCULAR | Status: AC
  Filled 2021-10-07: qty 2

## 2021-10-07 MED ORDER — FENTANYL CITRATE (PF) 100 MCG/2ML IJ SOLN
INTRAMUSCULAR | Status: AC | PRN
  Administered 2021-10-07: 25 ug via INTRAVENOUS

## 2021-10-07 NOTE — Progress Notes (Signed)
PT Cancellation Note  Patient Details Name: Tammy Moses MRN: [id number] DOB: 12-06-1965   Cancelled Treatment:    Reason Eval/Treat Not Completed: (P) Patient at procedure or test/unavailable Pt is off floor for fistulogram. PT will follow back for treatment this afternoon as able.  Marshun Duva B. Migdalia Dk PT, DPT Acute Rehabilitation Services Pager [phone] Office [phone]    [place] 10/07/2021, 9:18 AM

## 2021-10-07 NOTE — Progress Notes (Signed)
PROGRESS NOTE    Tammy Moses  VBT:[id number] DOB: 15-Feb-1966 DOA: 09/30/2021 PCP: [name], FNP   Chief Complain:  Brief Narrative:  55 year old African-American female with a history of end-stage renal disease needing dialysis on Monday Wednesday Friday with a history of noncompliance, chronic leukocytosis, type 2 diabetes, hypertension, anemia chronic disease, COPD, chronic tobacco abuse presented to the ER  via EMS with hypoxia and respiratory distress, elevated BP.  She was admitted for the management of acute hypoxic tori failure secondary to volume overload from missed dialysis causing flash pulmonary edema and malignant hypertension: All from noncompliance.  She was recently started on dialysis.  Nephrology following.  PT/OT recommended skilled nursing facility on discharge.  She has a bed tomorrow.  Assessment & Plan:   Principal Problem:   Malignant hypertension Active Problems:   BLINDNESS, RIGHT EYE   Type 2 DM with hypertension and ESRD on dialysis ([place])   COPD (chronic obstructive pulmonary disease) (HCC)   ESRD (end stage renal disease) ([place]) -  Dialyzes at [facility].  On Monday Wednesday Friday.   Flash pulmonary edema (HCC)   Non compliance with medical treatment   Acute Hypoxic Resp Failure: due to fluid overload from missed dialysis causing flash pulmonary edema, malignant hypertension - all due to noncompliance with HD regimen and blood pressure medications, was urgently dialyzed 09/30/2021 and now on schedule.  Symptoms have resolved blood pressure medications have been adjusted and blood pressure stable.Now on room air.   ESRD:On MWF schedule nephrology on board.  Underwent left upper extremity AV fistulogram/declotting of dialysis access with IR on 12/14. Hyperkalemia treated with Lokelma  Poorly controlled hypertension: Noncompliant.  Counseled, medications adjusted further on 10/01/2021 for better control.    Legal blindness: Right eye blindness  with cataract in the other eye as well.     COPD:  At baseline.   Acute on chronic diastolic CHF : EF 97% on recent echo.      Chronic leukocytosis:  Signs of active infection, outpatient PCP directed work-up.  Normocytic anemia: Secondary to ESRD.  Currently hemoglobin is stable  Hyponatremia: Most likely secondary to volume overload.  Continue to monitor   DM type II:  On sliding scale.  Debility/deconditioning: PT/OT recommended skilled facility on discharge.  She is also legally blind.         DVT prophylaxis:Heparin [place] Code Status: Full Family Communication: None at bedside Patient status:Inpatient  Dispo: The patient is from: Home              Anticipated d/c is to: SNF              Anticipated d/c date is: SNF  Consultants: [place],IR  Procedures: Dialysis/AV fistulogram  Antimicrobials:  Anti-infectives (From admission, onward)    Start     Dose/Rate Route Frequency Ordered Stop   09/30/21 2015  vancomycin (VANCOREADY) IVPB 1250 mg/250 mL        1,250 mg 166.7 mL/hr over 90 Minutes Intravenous  Once 09/30/21 2008 10/01/21 0122   09/30/21 1145  ceFEPIme (MAXIPIME) 2 g in sodium chloride 0.9 % 100 mL IVPB        2 g 200 mL/hr over 30 Minutes Intravenous  Once 09/30/21 1130 09/30/21 2053   09/30/21 1145  vancomycin (VANCOREADY) IVPB 1250 mg/250 mL  Status:  Discontinued        1,250 mg 166.7 mL/hr over 90 Minutes Intravenous  Once 09/30/21 1137 09/30/21 2008       Subjective:  Patient seen and examined at the bedside this afternoon.  Lying in bed.  Hemodynamically stable.  Comfortable on room air.  Denies any complaints  Objective: Vitals:   10/07/21 1025 10/07/21 1030 10/07/21 1035 10/07/21 1050  BP: (!) 143/55 133/65 (!) 118/50 120/60  Pulse: 72 74 69 72  Resp: 10 10 (!) 9 12  Temp:      TempSrc:      SpO2: 100% 100% 100% 99%  Weight:      Height:        Intake/Output Summary (Last 24 hours) at 10/07/2021 1436 Last data filed at 10/07/2021  0300 Gross per 24 hour  Intake 480 ml  Output 2800 ml  Net -2320 ml   Filed Weights   10/06/21 2320 10/07/21 0300 10/07/21 0315  Weight: 61.4 kg 58.6 kg 58.6 kg    Examination:  General exam: Overall comfortable, not in distress HEENT: Legally blind Respiratory system:  no wheezes or crackles  Cardiovascular system: S1 & S2 heard, RRR.  Gastrointestinal system: Abdomen is nondistended, soft and nontender. Central nervous system: Alert and oriented Extremities: No edema, no clubbing ,no cyanosis, AV fistula on the left forearm Skin: No rashes, no ulcers,no icterus      Data Reviewed: I have personally reviewed following labs and imaging studies  CBC: Recent Labs  Lab 09/30/21 2118 10/01/21 0400 10/01/21 1548 10/05/21 0824 10/06/21 0303  WBC 25.9* 21.9* 19.4* 9.8 10.7*  NEUTROABS  --  19.5*  --   --   --   HGB 9.7* 10.2* 10.6* 9.3* 9.0*  HCT 30.2* 32.0* 34.0* 29.5* 28.6*  MCV 91.0 91.7 91.4 89.9 89.9  PLT 390 422* 444* 282 161   Basic Metabolic Panel: Recent Labs  Lab 09/30/21 2118 10/01/21 0400 10/02/21 0153 10/05/21 0824 10/06/21 0303  NA 134* 131* 136 126* 125*  K 4.5 5.0 4.3 5.0 5.5*  CL 97* 96* 95* 93* 91*  CO2 22 22 23  19* 18*  GLUCOSE 317* 127* 92 224* 94  BUN 40* 48* 65* 76* 92*  CREATININE 8.42* 9.17* 10.14* 10.46* 12.45*  CALCIUM 8.6* 8.5* 7.9* 8.9 9.2  PHOS  --   --  6.6* 5.1*  --    GFR: Estimated Creatinine Clearance: 4.1 mL/min (A) (by C-G formula based on SCr of 12.45 mg/dL (H)). Liver Function Tests: Recent Labs  Lab 10/01/21 0400 10/02/21 0153 10/05/21 0824  AST 13*  --   --   ALT 10  --   --   ALKPHOS 59  --   --   BILITOT 0.5  --   --   PROT 7.2  --   --   ALBUMIN 2.6* 2.4* 2.7*   No results for input(s): LIPASE, AMYLASE in the last 168 hours. No results for input(s): AMMONIA in the last 168 hours. Coagulation Profile: No results for input(s): INR, PROTIME in the last 168 hours. Cardiac Enzymes: No results for input(s):  CKTOTAL, CKMB, CKMBINDEX, TROPONINI in the last 168 hours. BNP (last 3 results) No results for input(s): PROBNP in the last 8760 hours. HbA1C: No results for input(s): HGBA1C in the last 72 hours. CBG: Recent Labs  Lab 10/06/21 1533 10/06/21 2126 10/07/21 0325 10/07/21 0721 10/07/21 1110  GLUCAP 148* 184* 140* 119* 108*   Lipid Profile: No results for input(s): CHOL, HDL, LDLCALC, TRIG, CHOLHDL, LDLDIRECT in the last 72 hours. Thyroid Function Tests: No results for input(s): TSH, T4TOTAL, FREET4, T3FREE, THYROIDAB in the last 72 hours. Anemia Panel: No results for input(s):  VITAMINB12, FOLATE, FERRITIN, TIBC, IRON, RETICCTPCT in the last 72 hours. Sepsis Labs: Recent Labs  Lab 10/01/21 0400 10/02/21 0124  PROCALCITON 2.66 3.09    Recent Results (from the past 240 hour(s))  Resp Panel by RT-PCR (Flu A&B, Covid) Nasopharyngeal Swab     Status: None   Collection Time: 09/30/21 11:38 AM   Specimen: Nasopharyngeal Swab; Nasopharyngeal(NP) swabs in vial transport medium  Result Value Ref Range Status   SARS Coronavirus 2 by RT PCR NEGATIVE NEGATIVE Final    Comment: (NOTE) SARS-CoV-2 target nucleic acids are NOT DETECTED.  The SARS-CoV-2 RNA is generally detectable in upper respiratory specimens during the acute phase of infection. The lowest concentration of SARS-CoV-2 viral copies this assay can detect is 138 copies/mL. A negative result does not preclude SARS-Cov-2 infection and should not be used as the sole basis for treatment or other patient management decisions. A negative result may occur with  improper specimen collection/handling, submission of specimen other than nasopharyngeal swab, presence of viral mutation(s) within the areas targeted by this assay, and inadequate number of viral copies(<138 copies/mL). A negative result must be combined with clinical observations, patient history, and epidemiological information. The expected result is Negative.  Fact  Sheet for Patients:  EntrepreneurPulse.com.au  Fact Sheet for Healthcare Providers:  IncredibleEmployment.be  This test is no t yet approved or cleared by the [place] FDA and  has been authorized for detection and/or diagnosis of SARS-CoV-2 by FDA under an Emergency Use Authorization (EUA). This EUA will remain  in effect (meaning this test can be used) for the duration of the COVID-19 declaration under Section 564(b)(1) of the Act, 21 U.S.C.section 360bbb-3(b)(1), unless the authorization is terminated  or revoked sooner.       Influenza A by PCR NEGATIVE NEGATIVE Final   Influenza B by PCR NEGATIVE NEGATIVE Final    Comment: (NOTE) The Xpert Xpress SARS-CoV-2/FLU/RSV plus assay is intended as an aid in the diagnosis of influenza from Nasopharyngeal swab specimens and should not be used as a sole basis for treatment. Nasal washings and aspirates are unacceptable for Xpert Xpress SARS-CoV-2/FLU/RSV testing.  Fact Sheet for Patients: EntrepreneurPulse.com.au  Fact Sheet for Healthcare Providers: IncredibleEmployment.be  This test is not yet approved or cleared by the [place] FDA and has been authorized for detection and/or diagnosis of SARS-CoV-2 by FDA under an Emergency Use Authorization (EUA). This EUA will remain in effect (meaning this test can be used) for the duration of the COVID-19 declaration under Section 564(b)(1) of the Act, 21 U.S.C. section 360bbb-3(b)(1), unless the authorization is terminated or revoked.  Performed at [facility], [place] [address], [place], [place] [address]          Radiology Studies: IR Fluoro Guide CV Line Right  Result Date: 10/06/2021 INDICATION: 55 year old female with history of end-stage renal disease with malfunctioning upper extremity fistula. The patient elected to forego NPO status and have temporary hemodialysis catheter placed to  delay fistulagram. EXAM: NON-TUNNELED CENTRAL VENOUS HEMODIALYSIS CATHETER PLACEMENT WITH ULTRASOUND AND FLUOROSCOPIC GUIDANCE COMPARISON:  None. MEDICATIONS: None FLUOROSCOPY TIME:  0 minutes, 24 seconds (2 mGy) COMPLICATIONS: None immediate. PROCEDURE: Informed written consent was obtained from the patient after a discussion of the risks, benefits, and alternatives to treatment. Questions regarding the procedure were encouraged and answered. The right neck and chest were prepped with chlorhexidine in a sterile fashion, and a sterile drape was applied covering the operative field. Maximum barrier sterile technique with sterile gowns and gloves  were used for the procedure. A timeout was performed prior to the initiation of the procedure. After the overlying soft tissues were anesthetized, a small venotomy incision was created and a micropuncture kit was utilized to access the internal jugular vein. Real-time ultrasound guidance was utilized for vascular access including the acquisition of a permanent ultrasound image documenting patency of the accessed vessel. The microwire was utilized to measure appropriate catheter length. A Rosen wire was advanced to the level of the IVC. Under fluoroscopic guidance, the venotomy was serially dilated, ultimately allowing placement of a 15 cm temporary Trialysis catheter with tip ultimately terminating within the superior aspect of the right atrium. Final catheter positioning was confirmed and documented with a spot radiographic image. The catheter aspirates and flushes normally. The catheter was flushed with appropriate volume heparin dwells. The catheter exit site was secured with a 0 silk retention suture. A dressing was placed. The patient tolerated the procedure well without immediate post procedural complication. IMPRESSION: Successful placement of a right internal jugular approach 15 cm temporary dialysis catheter with tip terminating with in the superior aspect of the  right atrium. The catheter is ready for immediate use. PLAN: This catheter may be converted to a tunneled dialysis catheter at a later date as indicated. Plan for fistulagram as originally requested this admission. Ruthann Cancer, MD [facility] Radiology Electronically Signed   By: Ruthann Cancer M.D.   On: 10/06/2021 15:33   IR US Guide Vasc Access Right  Result Date: 10/06/2021 INDICATION: 55 year old female with history of end-stage renal disease with malfunctioning upper extremity fistula. The patient elected to forego NPO status and have temporary hemodialysis catheter placed to delay fistulagram. EXAM: NON-TUNNELED CENTRAL VENOUS HEMODIALYSIS CATHETER PLACEMENT WITH ULTRASOUND AND FLUOROSCOPIC GUIDANCE COMPARISON:  None. MEDICATIONS: None FLUOROSCOPY TIME:  0 minutes, 24 seconds (2 mGy) COMPLICATIONS: None immediate. PROCEDURE: Informed written consent was obtained from the patient after a discussion of the risks, benefits, and alternatives to treatment. Questions regarding the procedure were encouraged and answered. The right neck and chest were prepped with chlorhexidine in a sterile fashion, and a sterile drape was applied covering the operative field. Maximum barrier sterile technique with sterile gowns and gloves were used for the procedure. A timeout was performed prior to the initiation of the procedure. After the overlying soft tissues were anesthetized, a small venotomy incision was created and a micropuncture kit was utilized to access the internal jugular vein. Real-time ultrasound guidance was utilized for vascular access including the acquisition of a permanent ultrasound image documenting patency of the accessed vessel. The microwire was utilized to measure appropriate catheter length. A Rosen wire was advanced to the level of the IVC. Under fluoroscopic guidance, the venotomy was serially dilated, ultimately allowing placement of a 15 cm  temporary Trialysis catheter with tip ultimately terminating within the superior aspect of the right atrium. Final catheter positioning was confirmed and documented with a spot radiographic image. The catheter aspirates and flushes normally. The catheter was flushed with appropriate volume heparin dwells. The catheter exit site was secured with a 0 silk retention suture. A dressing was placed. The patient tolerated the procedure well without immediate post procedural complication. IMPRESSION: Successful placement of a right internal jugular approach 15 cm temporary dialysis catheter with tip terminating with in the superior aspect of the right atrium. The catheter is ready for immediate use. PLAN: This catheter may be converted to a tunneled dialysis catheter at a later date as indicated. Plan  for fistulagram as originally requested this admission. Ruthann Cancer, MD [facility] Radiology Electronically Signed   By: Ruthann Cancer M.D.   On: 10/06/2021 15:33   IR AV DIALY SHUNT INTRO NEEDLE/INTRACATH INITIAL W/PTA/IMG RIGHT  Result Date: 10/07/2021 INDICATION: END-STAGE RENAL DISEASE, LEFT UPPER ARM CHRONIC AV FISTULA, PREVIOUSLY STENTED PERIPHERALLY. DECREASED ACCESS FLOW RATES. EXAM: ULTRASOUND GUIDANCE FOR VASCULAR ACCESS LEFT UPPER ARM AV FISTULOGRAM PERIPHERAL 7 MM VENOUS ANGIOPLASTY (OF A PREVIOUSLY STENTED SEGMENT) MEDICATIONS: 1% LIDOCAINE LOCAL ANESTHESIA/SEDATION: Moderate (conscious) sedation was employed during this procedure. A total of Versed 0.5 mg and Fentanyl 25 mcg was administered intravenously by the radiology nurse. Total intra-service moderate Sedation Time: 15 minutes. The patient's level of consciousness and vital signs were monitored continuously by radiology nursing throughout the procedure under my direct supervision. FLUOROSCOPY TIME:  Fluoroscopy Time: 1 minutes 30 seconds (18 mGy). COMPLICATIONS: None immediate. PROCEDURE: Informed  written consent was obtained from the patient after a thorough discussion of the procedural risks, benefits and alternatives. All questions were addressed. Maximal Sterile Barrier Technique was utilized including caps, mask, sterile gowns, sterile gloves, sterile drape, hand hygiene and skin antiseptic. A timeout was performed prior to the initiation of the procedure. Previous imaging reviewed. Ultrasound micropuncture access performed of the patent left upper arm fistula close to the arterial anastomosis at the elbow. Images obtained for documentation of the patent fistula. The guidewire advanced followed by the micro dilator set. Initial outflow venogram performed. Left upper arm fistulagram: Fistula is patent. 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 In the upper humerus level, there is a peripheral stent with intimal hyperplasia at both ends resulting in tandem stenoses estimated at 50-70%. This results in flow limitation centrally. Central venogram confirms patency of the central veins. However, there is sluggish venous flow centrally because of in stent stenoses peripherally. Peripheral 7 mm angioplasty: 6 French sheath inserted. Guidewire access confirmed across the stented segment. Overlapping 7 mm angioplasty performed with a 7 x 4 mustang balloon. Balloon was inflated to 20 atmospheres for 1 minute inter overlapping fashion. Two inflations performed. Following angioplasty, repeat shuntogram demonstrates improved patency of the stent with only minor residual narrowing, less than 30%. There is improved venous outflow centrally following angioplasty. Central veins were re-examine and confirmed to be widely patent. Access removed. Hemostasis obtained with pursestring suture. No immediate complication. Patient tolerated the procedure well. IMPRESSION: Fistulagram confirms peripheral in stent stenoses at both ends of the stent estimated at 50-70%. This responded to 7 mm overlapping angioplasty as above  with very minimal residual narrowing. Following angioplasty there is improved venous outflow centrally. ACCESS: This access remains amenable to future percutaneous interventions as clinically indicated. Electronically Signed   By: Jerilynn Mages.  Shick M.D.   On: 10/07/2021 10:59        Scheduled Meds:  amLODipine  10 mg Oral Daily   atropine  1 drop Both Eyes BID   calcium acetate  2,001 mg Oral TID WC   carvedilol  6.25 mg Oral BID WC   Chlorhexidine Gluconate Cloth  6 each Topical Q0600   dorzolamide-timolol  1 drop Both Eyes BID   furosemide  40 mg Oral Daily   gabapentin  600 mg Oral BID   heparin  5,000 Units Subcutaneous Q8H   hydrALAZINE  100 mg Oral Q8H   insulin aspart  0-5 Units Subcutaneous QHS   insulin aspart  0-6 Units Subcutaneous TID WC   lamoTRIgine  25 mg Oral Daily  lidocaine       pravastatin  80 mg Oral q1800   Continuous Infusions:   LOS: 6 days    Time spent: 25 mins.More than 50% of that time was spent in counseling and/or coordination of care.      Shelly Coss, MD [facility] P12/14/2022, 2:36 PM

## 2021-10-07 NOTE — Procedures (Signed)
Interventional Radiology Procedure Note  Procedure: LUE AVFISTULOGRAM WITH 7MM PTA    Complications: None  Estimated Blood Loss:  MIN  Findings: FULL REPORT IN PACS    Tamera Punt, MD

## 2021-10-07 NOTE — TOC Progression Note (Addendum)
Transition of Care [facility]) - Progression Note    Patient Details  Name: Tammy Moses MRN: [id number] Date of Birth: 10-18-1966  Transition of Care [facility]) CM/SW [place], [place] Phone Number: 10/07/2021, 3:26 PM  Clinical Narrative:     Update- CSW started insurance authorization for patient. [name] ID# [id number]. Patient has SNF bed at [facility]. Insurance authorization is pending. CSW will continue to follow.  CSW spoke with Olivia Mackie renal navigator who confirmed Pt has been approved for [employer] on TTS. Pt will need to arrive at 6:15 for 6:30 chair time. CSW informed MD,SNF, and patient. Bryson Ha with [facility] confirmed patient can dc over tomorrow after HD if medically ready and insurance approved. CSW will start insurance authorization for patient. [place] renal navigator confirmed patient can start HD at [facility] on Saturday. CSW will continue to follow and assist with patients dc planning needs.  Expected Discharge Plan: Skilled Nursing Facility Barriers to Discharge: Ship broker, Continued Medical Work up, SNF Pending bed offer  Expected Discharge Plan and Services Expected Discharge Plan: [place] arrangements for the past 2 months: Apartment                                       Social Determinants of Health (SDOH) Interventions    Readmission Risk Interventions No flowsheet data found.

## 2021-10-07 NOTE — Progress Notes (Signed)
[facility] ROUNDING NOTE   Subjective:   Interval History: 55 year old lady end-stage renal disease Monday Wednesday Friday dialysis.  Admitted to the hospital with acute pulmonary edema and shortness of breath improved with dialysis and ultrafiltration.  Has poor living situation and therefore needs to be placed in SNF.  Clotted dialysis access 10/05/2021.  Discussed with interventional radiology.  Patient underwent dialysis 10/07/2021 with 2.8 L removed.  Next dialysis treatment will be 10/09/2021  Blood pressure 120/60 pulse 70 O2 sats 99% room air  Labs pending.  Status post declot 10/07/2021     Objective:  Vital signs in last 24 hours:  Temp:  [98 F (36.7 C)-98.4 F (36.9 C)] 98.1 F (36.7 C) (12/14 0315) Pulse Rate:  [69-80] 72 (12/14 1050) Resp:  [9-17] 12 (12/14 1050) BP: (107-162)/(46-67) 120/60 (12/14 1050) SpO2:  [96 %-100 %] 99 % (12/14 1050) Weight:  [58.6 kg-61.4 kg] 58.6 kg (12/14 0315)  Weight change: 4.3 kg Filed Weights   10/06/21 2320 10/07/21 0300 10/07/21 0315  Weight: 61.4 kg 58.6 kg 58.6 kg    Intake/Output: I/O last 3 completed shifts: In: 780 [P.O.:780] Out: 2800 [Other:2800]   Intake/Output this shift:  No intake/output data recorded.  CVS- RRR RS- CTA ABD- BS present soft non-distended EXT- no edema left upper arm AV fistula     Basic Metabolic Panel: Recent Labs  Lab 09/30/21 2118 10/01/21 0400 10/02/21 0153 10/05/21 0824 10/06/21 0303  NA 134* 131* 136 126* 125*  K 4.5 5.0 4.3 5.0 5.5*  CL 97* 96* 95* 93* 91*  CO2 22 22 23  19* 18*  GLUCOSE 317* 127* 92 224* 94  BUN 40* 48* 65* 76* 92*  CREATININE 8.42* 9.17* 10.14* 10.46* 12.45*  CALCIUM 8.6* 8.5* 7.9* 8.9 9.2  PHOS  --   --  6.6* 5.1*  --      Liver Function Tests: Recent Labs  Lab 09/30/21 1200 09/30/21 1316 10/01/21 0400 10/02/21 0153 10/05/21 0824  AST 19 23 13*  --   --   ALT 16 18 10   --   --   ALKPHOS 64 66 59  --   --   BILITOT 0.6  0.5 0.5  --   --   PROT 7.6 7.9 7.2  --   --   ALBUMIN 3.0* 3.2* 2.6* 2.4* 2.7*    No results for input(s): LIPASE, AMYLASE in the last 168 hours. No results for input(s): AMMONIA in the last 168 hours.  CBC: Recent Labs  Lab 09/30/21 2118 10/01/21 0400 10/01/21 1548 10/05/21 0824 10/06/21 0303  WBC 25.9* 21.9* 19.4* 9.8 10.7*  NEUTROABS  --  19.5*  --   --   --   HGB 9.7* 10.2* 10.6* 9.3* 9.0*  HCT 30.2* 32.0* 34.0* 29.5* 28.6*  MCV 91.0 91.7 91.4 89.9 89.9  PLT 390 422* 444* 282 278     Cardiac Enzymes: No results for input(s): CKTOTAL, CKMB, CKMBINDEX, TROPONINI in the last 168 hours.  BNP: Invalid input(s): POCBNP  CBG: Recent Labs  Lab 10/06/21 1133 10/06/21 1533 10/06/21 2126 10/07/21 0325 10/07/21 0721  GLUCAP 117* 148* 184* 140* 119*     Microbiology: Results for orders placed or performed during the hospital encounter of 09/30/21  Resp Panel by RT-PCR (Flu A&B, Covid) Nasopharyngeal Swab     Status: None   Collection Time: 09/30/21 11:38 AM   Specimen: Nasopharyngeal Swab; Nasopharyngeal(NP) swabs in vial transport medium  Result Value Ref Range Status   SARS Coronavirus 2  by RT PCR NEGATIVE NEGATIVE Final    Comment: (NOTE) SARS-CoV-2 target nucleic acids are NOT DETECTED.  The SARS-CoV-2 RNA is generally detectable in upper respiratory specimens during the acute phase of infection. The lowest concentration of SARS-CoV-2 viral copies this assay can detect is 138 copies/mL. A negative result does not preclude SARS-Cov-2 infection and should not be used as the sole basis for treatment or other patient management decisions. A negative result may occur with  improper specimen collection/handling, submission of specimen other than nasopharyngeal swab, presence of viral mutation(s) within the areas targeted by this assay, and inadequate number of viral copies(<138 copies/mL). A negative result must be combined with clinical observations, patient  history, and epidemiological information. The expected result is Negative.  Fact Sheet for Patients:  EntrepreneurPulse.com.au  Fact Sheet for Healthcare Providers:  IncredibleEmployment.be  This test is no t yet approved or cleared by the [place] FDA and  has been authorized for detection and/or diagnosis of SARS-CoV-2 by FDA under an Emergency Use Authorization (EUA). This EUA will remain  in effect (meaning this test can be used) for the duration of the COVID-19 declaration under Section 564(b)(1) of the Act, 21 U.S.C.section 360bbb-3(b)(1), unless the authorization is terminated  or revoked sooner.       Influenza A by PCR NEGATIVE NEGATIVE Final   Influenza B by PCR NEGATIVE NEGATIVE Final    Comment: (NOTE) The Xpert Xpress SARS-CoV-2/FLU/RSV plus assay is intended as an aid in the diagnosis of influenza from Nasopharyngeal swab specimens and should not be used as a sole basis for treatment. Nasal washings and aspirates are unacceptable for Xpert Xpress SARS-CoV-2/FLU/RSV testing.  Fact Sheet for Patients: EntrepreneurPulse.com.au  Fact Sheet for Healthcare Providers: IncredibleEmployment.be  This test is not yet approved or cleared by the [place] FDA and has been authorized for detection and/or diagnosis of SARS-CoV-2 by FDA under an Emergency Use Authorization (EUA). This EUA will remain in effect (meaning this test can be used) for the duration of the COVID-19 declaration under Section 564(b)(1) of the Act, 21 U.S.C. section 360bbb-3(b)(1), unless the authorization is terminated or revoked.  Performed at [facility], [place] [address] [place], [place] [address]     Coagulation Studies: No results for input(s): LABPROT, INR in the last 72 hours.  Urinalysis: No results for input(s): COLORURINE, LABSPEC, PHURINE, GLUCOSEU, HGBUR, BILIRUBINUR, KETONESUR, PROTEINUR,  UROBILINOGEN, NITRITE, LEUKOCYTESUR in the last 72 hours.  Invalid input(s): APPERANCEUR    Imaging: IR Fluoro Guide CV Line Right  Result Date: 10/06/2021 INDICATION: 55 year old female with history of end-stage renal disease with malfunctioning upper extremity fistula. The patient elected to forego NPO status and have temporary hemodialysis catheter placed to delay fistulagram. EXAM: NON-TUNNELED CENTRAL VENOUS HEMODIALYSIS CATHETER PLACEMENT WITH ULTRASOUND AND FLUOROSCOPIC GUIDANCE COMPARISON:  None. MEDICATIONS: None FLUOROSCOPY TIME:  0 minutes, 24 seconds (2 mGy) COMPLICATIONS: None immediate. PROCEDURE: Informed written consent was obtained from the patient after a discussion of the risks, benefits, and alternatives to treatment. Questions regarding the procedure were encouraged and answered. The right neck and chest were prepped with chlorhexidine in a sterile fashion, and a sterile drape was applied covering the operative field. Maximum barrier sterile technique with sterile gowns and gloves were used for the procedure. A timeout was performed prior to the initiation of the procedure. After the overlying soft tissues were anesthetized, a small venotomy incision was created and a micropuncture kit was utilized to access the internal jugular vein. Real-time ultrasound guidance  was utilized for vascular access including the acquisition of a permanent ultrasound image documenting patency of the accessed vessel. The microwire was utilized to measure appropriate catheter length. A Rosen wire was advanced to the level of the IVC. Under fluoroscopic guidance, the venotomy was serially dilated, ultimately allowing placement of a 15 cm temporary Trialysis catheter with tip ultimately terminating within the superior aspect of the right atrium. Final catheter positioning was confirmed and documented with a spot radiographic image. The catheter aspirates and flushes normally. The catheter was flushed with  appropriate volume heparin dwells. The catheter exit site was secured with a 0 silk retention suture. A dressing was placed. The patient tolerated the procedure well without immediate post procedural complication. IMPRESSION: Successful placement of a right internal jugular approach 15 cm temporary dialysis catheter with tip terminating with in the superior aspect of the right atrium. The catheter is ready for immediate use. PLAN: This catheter may be converted to a tunneled dialysis catheter at a later date as indicated. Plan for fistulagram as originally requested this admission. Ruthann Cancer, MD [facility] Radiology Electronically Signed   By: Ruthann Cancer M.D.   On: 10/06/2021 15:33   IR US Guide Vasc Access Right  Result Date: 10/06/2021 INDICATION: 55 year old female with history of end-stage renal disease with malfunctioning upper extremity fistula. The patient elected to forego NPO status and have temporary hemodialysis catheter placed to delay fistulagram. EXAM: NON-TUNNELED CENTRAL VENOUS HEMODIALYSIS CATHETER PLACEMENT WITH ULTRASOUND AND FLUOROSCOPIC GUIDANCE COMPARISON:  None. MEDICATIONS: None FLUOROSCOPY TIME:  0 minutes, 24 seconds (2 mGy) COMPLICATIONS: None immediate. PROCEDURE: Informed written consent was obtained from the patient after a discussion of the risks, benefits, and alternatives to treatment. Questions regarding the procedure were encouraged and answered. The right neck and chest were prepped with chlorhexidine in a sterile fashion, and a sterile drape was applied covering the operative field. Maximum barrier sterile technique with sterile gowns and gloves were used for the procedure. A timeout was performed prior to the initiation of the procedure. After the overlying soft tissues were anesthetized, a small venotomy incision was created and a micropuncture kit was utilized to access the internal jugular vein. Real-time ultrasound  guidance was utilized for vascular access including the acquisition of a permanent ultrasound image documenting patency of the accessed vessel. The microwire was utilized to measure appropriate catheter length. A Rosen wire was advanced to the level of the IVC. Under fluoroscopic guidance, the venotomy was serially dilated, ultimately allowing placement of a 15 cm temporary Trialysis catheter with tip ultimately terminating within the superior aspect of the right atrium. Final catheter positioning was confirmed and documented with a spot radiographic image. The catheter aspirates and flushes normally. The catheter was flushed with appropriate volume heparin dwells. The catheter exit site was secured with a 0 silk retention suture. A dressing was placed. The patient tolerated the procedure well without immediate post procedural complication. IMPRESSION: Successful placement of a right internal jugular approach 15 cm temporary dialysis catheter with tip terminating with in the superior aspect of the right atrium. The catheter is ready for immediate use. PLAN: This catheter may be converted to a tunneled dialysis catheter at a later date as indicated. Plan for fistulagram as originally requested this admission. Ruthann Cancer, MD [facility] Radiology Electronically Signed   By: Ruthann Cancer M.D.   On: 10/06/2021 15:33   IR AV DIALY SHUNT INTRO NEEDLE/INTRACATH INITIAL W/PTA/IMG RIGHT  Result  Date: 10/07/2021 INDICATION: END-STAGE RENAL DISEASE, LEFT UPPER ARM CHRONIC AV FISTULA, PREVIOUSLY STENTED PERIPHERALLY. DECREASED ACCESS FLOW RATES. EXAM: ULTRASOUND GUIDANCE FOR VASCULAR ACCESS LEFT UPPER ARM AV FISTULOGRAM PERIPHERAL 7 MM VENOUS ANGIOPLASTY (OF A PREVIOUSLY STENTED SEGMENT) MEDICATIONS: 1% LIDOCAINE LOCAL ANESTHESIA/SEDATION: Moderate (conscious) sedation was employed during this procedure. A total of Versed 0.5 mg and Fentanyl 25 mcg was administered  intravenously by the radiology nurse. Total intra-service moderate Sedation Time: 15 minutes. The patient's level of consciousness and vital signs were monitored continuously by radiology nursing throughout the procedure under my direct supervision. FLUOROSCOPY TIME:  Fluoroscopy Time: 1 minutes 30 seconds (18 mGy). COMPLICATIONS: None immediate. PROCEDURE: Informed written consent was obtained from the patient after a thorough discussion of the procedural risks, benefits and alternatives. All questions were addressed. Maximal Sterile Barrier Technique was utilized including caps, mask, sterile gowns, sterile gloves, sterile drape, hand hygiene and skin antiseptic. A timeout was performed prior to the initiation of the procedure. Previous imaging reviewed. Ultrasound micropuncture access performed of the patent left upper arm fistula close to the arterial anastomosis at the elbow. Images obtained for documentation of the patent fistula. The guidewire advanced followed by the micro dilator set. Initial outflow venogram performed. Left upper arm fistulagram: Fistula is patent. 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 In the upper humerus level, there is a peripheral stent with intimal hyperplasia at both ends resulting in tandem stenoses estimated at 50-70%. This results in flow limitation centrally. Central venogram confirms patency of the central veins. However, there is sluggish venous flow centrally because of in stent stenoses peripherally. Peripheral 7 mm angioplasty: 6 French sheath inserted. Guidewire access confirmed across the stented segment. Overlapping 7 mm angioplasty performed with a 7 x 4 mustang balloon. Balloon was inflated to 20 atmospheres for 1 minute inter overlapping fashion. Two inflations performed. Following angioplasty, repeat shuntogram demonstrates improved patency of the stent with only minor residual narrowing, less than 30%. There is improved venous outflow centrally following  angioplasty. Central veins were re-examine and confirmed to be widely patent. Access removed. Hemostasis obtained with pursestring suture. No immediate complication. Patient tolerated the procedure well. IMPRESSION: Fistulagram confirms peripheral in stent stenoses at both ends of the stent estimated at 50-70%. This responded to 7 mm overlapping angioplasty as above with very minimal residual narrowing. Following angioplasty there is improved venous outflow centrally. ACCESS: This access remains amenable to future percutaneous interventions as clinically indicated. Electronically Signed   By: Jerilynn Mages.  Shick M.D.   On: 10/07/2021 10:59     Medications:      amLODipine  10 mg Oral Daily   atropine  1 drop Both Eyes BID   calcium acetate  2,001 mg Oral TID WC   carvedilol  6.25 mg Oral BID WC   Chlorhexidine Gluconate Cloth  6 each Topical Q0600   dorzolamide-timolol  1 drop Both Eyes BID   furosemide  40 mg Oral Daily   gabapentin  600 mg Oral BID   heparin  5,000 Units Subcutaneous Q8H   hydrALAZINE  100 mg Oral Q8H   insulin aspart  0-5 Units Subcutaneous QHS   insulin aspart  0-6 Units Subcutaneous TID WC   lamoTRIgine  25 mg Oral Daily   lidocaine       pravastatin  80 mg Oral q1800   acetaminophen **OR** acetaminophen, albuterol, loperamide, ondansetron **OR** ondansetron (ZOFRAN) IV  Assessment/ Plan:  ESRD-[facility] Monday Wednesday Friday.  Outpatient orders 2-1/2 hours F1 80 2K 2.5  calcium.  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  Fistulogram clotted access.  Scheduled for declot 10/07/2021 Hyperkalemia we will give Lokelma until we can schedule dialysis. Awaiting nursing home placement   LOS: [place] @TODAY @11 :06  AM

## 2021-10-07 NOTE — Progress Notes (Signed)
Hep B total core antibody lab result faxed to [facility] admissions this am. Spoke to [place], SW, at [facility] this am. Chi aware that [facility] needs clinicals faxed today. Encouraged her to fax as soon as possible. Spoke to [place] at [facility] admissions to provide update on the above info. Will assist.   Melven Sartorius Renal Navigator [phone]

## 2021-10-08 ENCOUNTER — Encounter (HOSPITAL_COMMUNITY): Payer: Self-pay

## 2021-10-08 ENCOUNTER — Other Ambulatory Visit: Payer: Self-pay | Admitting: *Deleted

## 2021-10-08 HISTORY — PX: IR US GUIDE VASC ACCESS RIGHT: IMG2390

## 2021-10-08 LAB — BASIC METABOLIC PANEL
Anion gap: 15 (ref 5–15)
BUN: 45 mg/dL — ABNORMAL HIGH (ref 6–20)
CO2: 25 mmol/L (ref 22–32)
Calcium: 8.9 mg/dL (ref 8.9–10.3)
Chloride: 92 mmol/L — ABNORMAL LOW (ref 98–111)
Creatinine, Ser: 7.57 mg/dL — ABNORMAL HIGH (ref 0.44–1.00)
GFR, Estimated: 6 mL/min — ABNORMAL LOW (ref >60)
Glucose, Bld: 129 mg/dL — ABNORMAL HIGH (ref 70–99)
Potassium: 3.8 mmol/L (ref 3.5–5.1)
Sodium: 132 mmol/L — ABNORMAL LOW (ref 135–145)

## 2021-10-08 LAB — GLUCOSE, CAPILLARY
Glucose-Capillary: 111 mg/dL — ABNORMAL HIGH (ref 70–99)
Glucose-Capillary: 143 mg/dL — ABNORMAL HIGH (ref 70–99)
Glucose-Capillary: 160 mg/dL — ABNORMAL HIGH (ref 70–99)

## 2021-10-08 MED ORDER — CHLORHEXIDINE GLUCONATE CLOTH 2 % EX PADS
6.0000 | MEDICATED_PAD | Freq: Every day | CUTANEOUS | Status: DC
  Administered 2021-10-08 – 2021-10-09 (×2): 6 via TOPICAL

## 2021-10-08 MED ORDER — CHLORHEXIDINE GLUCONATE CLOTH 2 % EX PADS: 6.0000 | MEDICATED_PAD | Freq: Every day | CUTANEOUS | Status: DC

## 2021-10-08 NOTE — Patient Outreach (Signed)
[facility]) Care Management  10/08/2021  Tammy Moses [birth year] [id number]   Transition of care  RN attempted outreach however pt has been hospitalized.  HIPAA voice message left to cell contact requesting a call back.  Will continue to follow up for ongoing transition of care needs.  Raina Mina, RN Care Management Coordinator [place] [facility] [phone]

## 2021-10-08 NOTE — Care Management Important Message (Signed)
Important Message  Patient Details  Name: Tammy Moses MRN: [id number] Date of Birth: December 17, 1965   Medicare Important Message Given:  Yes     [name] 10/08/2021, 10:32 AM

## 2021-10-08 NOTE — Progress Notes (Signed)
Physical Therapy Treatment   PT Notes: Pt supine in bed on entry, eager to work with therapy especially walking in hallway. Pt is limited in safe mobility by decreased vision, and R foot drop in presence of decreased strength, balance and endurance. Pt is mod I for bed mobility, and min A for transfers and ambulation with RW. Pt reports orthotic to assist with foot drop that she will have her niece bring in as she feels that is not properly sized and she has had difficulty donning and doffing in the past. Despite decreased physical assist needed pt requires maximal multimodal cuing for safe navigation in hallways. PT continues to recommend short SNF level rehab stay prior to discharge. PT will continue to follow acutely.    10/08/21 1600  PT Visit Information  Last PT Received On 10/08/21  Assistance Needed +1 (Simultaneous filing. User may not have seen previous data.)  History of Present Illness 55 year old African-American female presented to the ER today via EMS with hypoxia and respiratory distress, +++ BP. PMH with a history of end-stage renal disease needing dialysis on Monday Wednesday Friday with a history of noncompliance, type 2 diabetes, hypertension, anemia chronic disease, COPD, R eye blindness, diabetic retinopathy, neuropathy, chronic tobacco abuse (Simultaneous filing. User may not have seen previous data.)  Subjective Data  Patient Stated Goal agreeable to rehab  Precautions  Precautions Fall (Simultaneous filing. User may not have seen previous data.)  Precaution Comments blind, Rt foot drop (Simultaneous filing. User may not have seen previous data.)  Restrictions  Weight Bearing Restrictions No  Pain Assessment  Pain Assessment Faces (Simultaneous filing. User may not have seen previous data.)  Faces Pain Scale 4 (Simultaneous filing. User may not have seen previous data.)  Pain Location R foot (Simultaneous filing. User may not have seen previous data.)  Pain  Descriptors / Indicators Sore (Simultaneous filing. User may not have seen previous data.)  Pain Intervention(s) Monitored during session;Limited activity within patient's tolerance;Repositioned (Simultaneous filing. User may not have seen previous data.)  Cognition  Arousal/Alertness Awake/alert (Simultaneous filing. User may not have seen previous data.)  Behavior During Therapy [facility] for tasks assessed/performed (Simultaneous filing. User may not have seen previous data.)  Overall Cognitive Status No family/caregiver present to determine baseline cognitive functioning (Simultaneous filing. User may not have seen previous data.)  General Comments Very willing to participate. Decreased safety awareness (Simultaneous filing. User may not have seen previous data.)  Bed Mobility  Overal bed mobility Modified Independent  Supine to sit Modified independent (Device/Increase time);HOB elevated  General bed mobility comments Increased time (Simultaneous filing. User may not have seen previous data.)  Transfers  Overall transfer level Needs assistance (Simultaneous filing. User may not have seen previous data.)  Equipment used Rolling walker (2 wheels) (Simultaneous filing. User may not have seen previous data.)  Transfers Sit to/from Stand (Simultaneous filing. User may not have seen previous data.)  Sit to Stand Min assist (Simultaneous filing. User may not have seen previous data.)  General transfer comment light min A for balance with initial standing (Simultaneous filing. User may not have seen previous data.)  Ambulation/Gait  Ambulation/Gait assistance Min assist  Gait Distance (Feet) 150 Feet  Assistive device Rolling walker (2 wheels)  Gait Pattern/deviations Step-to pattern;Step-through pattern;Decreased stance time - right;Antalgic;Steppage;Decreased dorsiflexion - right  General Gait Details cues and assist to guide walker for maneuvering in hallway, around doorway and to  navigate back to EOB; mild antalgia with R foot pain  Gait  velocity decreased  Gait velocity interpretation <1.31 ft/sec, indicative of household ambulator  Balance  Overall balance assessment Needs assistance (Simultaneous filing. User may not have seen previous data.)  Sitting-balance support No upper extremity supported;Feet supported (Simultaneous filing. User may not have seen previous data.)  Sitting balance-Leahy Scale Good (Simultaneous filing. User may not have seen previous data.)  Standing balance support Bilateral upper extremity supported;Single extremity supported (Simultaneous filing. User may not have seen previous data.)  Standing balance-Leahy Scale Poor (Simultaneous filing. User may not have seen previous data.)  General Comments  General comments (skin integrity, edema, etc.) VSS on RA,  General Exercises - Lower Extremity  Long Arc Quad Both;10 reps;Seated;AROM  Hip Flexion/Marching AROM;5 reps;Both;Seated  PT - End of Session  Equipment Utilized During Treatment Gait belt  Activity Tolerance Patient tolerated treatment well  Patient left in bed;with call bell/phone within reach;with bed alarm set  Nurse Communication Mobility status   PT - Assessment/Plan  PT Plan Current plan remains appropriate  PT Visit Diagnosis Difficulty in walking, not elsewhere classified (R26.2);Other symptoms and signs involving the nervous system (R29.898)  PT Frequency (ACUTE ONLY) Min 3X/week  Follow Up Recommendations Skilled nursing-short term rehab (<3 hours/day)  Assistance recommended at discharge Intermittent Supervision/Assistance  PT equipment None recommended by PT  AM-PAC PT "6 Clicks" Mobility Outcome Measure (Version 2)  Help needed turning from your back to your side while in a flat bed without using bedrails? 4  Help needed moving from lying on your back to sitting on the side of a flat bed without using bedrails? 4  Help needed moving to and from a bed to a chair  (including a wheelchair)? 3  Help needed standing up from a chair using your arms (e.g., wheelchair or bedside chair)? 2  Help needed to walk in hospital room? 1  Help needed climbing 3-5 steps with a railing?  1  6 Click Score 15  Consider Recommendation of Discharge To: CIR/SNF/LTACH  Progressive Mobility  What is the highest level of mobility based on the progressive mobility assessment? Level 5 (Walks with assist in room/hall) - Balance while stepping forward/back and can walk in room with assist - Complete (Simultaneous filing. User may not have seen previous data.)  Mobility Sit up in bed/chair position for meals (Simultaneous filing. User may not have seen previous data.)  PT Goal Progression  Progress towards PT goals Progressing toward goals  Acute Rehab PT Goals  PT Goal Formulation With patient  Time For Goal Achievement 10/16/21  Potential to Achieve Goals Fair  PT Time Calculation  PT Start Time (ACUTE ONLY) 1518  PT Stop Time (ACUTE ONLY) 1541  PT Time Calculation (min) (ACUTE ONLY) 23 min  PT General Charges  $$ ACUTE PT VISIT 1 Visit  PT Treatments  $Gait Training 8-22 mins  $Therapeutic Exercise 8-22 mins

## 2021-10-08 NOTE — Progress Notes (Addendum)
Pt has been accepted at [facility] on TTS schedule. Pt will need to arrive at 6:15 for 6:30 chair time. Per Joy at clinic, pt can start on Saturday. Covid test result faxed to clinic this am. Will assist as needed.  Melven Sartorius Renal Navigator [phone]  Addendum at 1:22 pm: Spoke to [place] at [facility] who states she did not receive covid test result fax. Re-faxed test result to clinic (fax#[phone]).   Addendum at 3:10 pm: Pt's case discussed with Dr Justin Mend. Pt is for HD tomorrow and then possible d/c to snf. MD states that pt requires HD inpt in order to confirm pt's access works. MD states that pt can start at [facility] on Tuesday. Spoke to B[place] at [facility] to make clinic aware pt will start on Tuesday. CSW made aware of this info and to inform snf. Clinic received covid test result today.

## 2021-10-08 NOTE — Progress Notes (Signed)
PROGRESS NOTE    Tammy Moses  URK:[id number] DOB: 1965/12/11 DOA: 09/30/2021 PCP: [name], FNP   Chief Complain:  Brief Narrative:  55 year old African-American female with a history of end-stage renal disease needing dialysis on Monday Wednesday Friday with a history of noncompliance, chronic leukocytosis, type 2 diabetes, hypertension, anemia chronic disease, COPD, chronic tobacco abuse presented to the ER  via EMS with hypoxia and respiratory distress, elevated BP.  She was admitted for the management of acute hypoxic respiratory failure secondary to volume overload from missed dialysis causing flash pulmonary edema and malignant hypertension: All from noncompliance.  She was  started on dialysis here.  Nephrology following.  PT/OT recommended skilled nursing facility on discharge.  She can be discharged after a dialysis session through her AV fistula and removal of temporary dialysis catheter.  Assessment & Plan:   Principal Problem:   Malignant hypertension Active Problems:   BLINDNESS, RIGHT EYE   Type 2 DM with hypertension and ESRD on dialysis ([place])   COPD (chronic obstructive pulmonary disease) (HCC)   ESRD (end stage renal disease) ([place]) -  Dialyzes at [facility].  On Monday Wednesday Friday.   Flash pulmonary edema (HCC)   Non compliance with medical treatment   Acute Hypoxic Resp Failure: due to fluid overload from missed dialysis causing flash pulmonary edema, malignant hypertension - all due to noncompliance with HD regimen and blood pressure medications, was urgently dialyzed 09/30/2021 and now on schedule.  Symptoms have resolved blood pressure medications have been adjusted and blood pressure stable.Now on room air.   ESRD:On MWF schedule nephrology on board.  Underwent left upper extremity AV fistulogram/declotting of dialysis access with IR on 12/14. Hyperkalemia treated with Lokelma.She will be on TTS schedule after dc  Poorly controlled hypertension:  Noncompliant.  Counseled, medications adjusted further on 10/01/2021 for better control.    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     COPD:  At baseline.   Acute on chronic diastolic CHF : EF of 83% on recent echo.     Normocytic anemia: Secondary to ESRD.  Currently hemoglobin is stable  Hyponatremia: Most likely secondary to volume overload.  Continue to monitor.Stable   DM type II:  On sliding scale insulin  Debility/deconditioning: PT/OT recommended skilled facility on discharge.  She is also legally blind.         DVT prophylaxis:Heparin [place] Code Status: Full Family Communication: None at bedside Patient status:Inpatient  Dispo: The patient is from: Home              Anticipated d/c is to: SNF              Anticipated d/c date is: SNF tomorrow after dialysis today and removal of TDC  Consultants: [place],IR  Procedures: Dialysis/AV fistulogram  Antimicrobials:  Anti-infectives (From admission, onward)    Start     Dose/Rate Route Frequency Ordered Stop   09/30/21 2015  vancomycin (VANCOREADY) IVPB 1250 mg/250 mL        1,250 mg 166.7 mL/hr over 90 Minutes Intravenous  Once 09/30/21 2008 10/01/21 0122   09/30/21 1145  ceFEPIme (MAXIPIME) 2 g in sodium chloride 0.9 % 100 mL IVPB        2 g 200 mL/hr over 30 Minutes Intravenous  Once 09/30/21 1130 09/30/21 2053   09/30/21 1145  vancomycin (VANCOREADY) IVPB 1250 mg/250 mL  Status:  Discontinued        1,250 mg 166.7  mL/hr over 90 Minutes Intravenous  Once 09/30/21 1137 09/30/21 2008       Subjective: Seen and examined at bedside this mrng.Comfortable,lying on bed,no new complains. She was concerned that she did not have any clothes for DC to SNF  Objective: Vitals:   10/07/21 1035 10/07/21 1050 10/07/21 2034 10/08/21 0436  BP: (!) 118/50 120/60 (!) 129/58 (!) 166/53  Pulse: 69 72 78 75  Resp: (!) 9 12 16 16   Temp:   98 F (36.7 C) 97.8 F (36.6 C)  TempSrc:   Oral Oral   SpO2: 100% 99% 98% 99%  Weight:    56.8 kg  Height:        Intake/Output Summary (Last 24 hours) at 10/08/2021 1249 Last data filed at 10/08/2021 1000 Gross per 24 hour  Intake 957 ml  Output 400 ml  Net 557 ml   Filed Weights   10/07/21 0300 10/07/21 0315 10/08/21 0436  Weight: 58.6 kg 58.6 kg 56.8 kg    Examination:  General exam: Overall comfortable, not in distress,weak,chronically ill looking HEENT: Legally blind Respiratory system:  no wheezes or crackles  Cardiovascular system: S1 & S2 heard, RRR. TDC on right chest Gastrointestinal system: Abdomen is nondistended, soft and nontender. Central nervous system: Alert and oriented Extremities: No edema, no clubbing ,no cyanosis,AV fistula on left UE Skin: No rashes, no ulcers,no icterus    Data Reviewed: I have personally reviewed following labs and imaging studies  CBC: Recent Labs  Lab 10/01/21 1548 10/05/21 0824 10/06/21 0303  WBC 19.4* 9.8 10.7*  HGB 10.6* 9.3* 9.0*  HCT 34.0* 29.5* 28.6*  MCV 91.4 89.9 89.9  PLT 444* 282 937   Basic Metabolic Panel: Recent Labs  Lab 10/02/21 0153 10/05/21 0824 10/06/21 0303 10/08/21 0203  NA 136 126* 125* 132*  K 4.3 5.0 5.5* 3.8  CL 95* 93* 91* 92*  CO2 23 19* 18* 25  GLUCOSE 92 224* 94 129*  BUN 65* 76* 92* 45*  CREATININE 10.14* 10.46* 12.45* 7.57*  CALCIUM 7.9* 8.9 9.2 8.9  PHOS 6.6* 5.1*  --   --    GFR: Estimated Creatinine Clearance: 6.2 mL/min (A) (by C-G formula based on SCr of 7.57 mg/dL (H)). Liver Function Tests: Recent Labs  Lab 10/02/21 0153 10/05/21 0824  ALBUMIN 2.4* 2.7*   No results for input(s): LIPASE, AMYLASE in the last 168 hours. No results for input(s): AMMONIA in the last 168 hours. Coagulation Profile: No results for input(s): INR, PROTIME in the last 168 hours. Cardiac Enzymes: No results for input(s): CKTOTAL, CKMB, CKMBINDEX, TROPONINI in the last 168 hours. BNP (last 3 results) No results for input(s): PROBNP in the  last 8760 hours. HbA1C: No results for input(s): HGBA1C in the last 72 hours. CBG: Recent Labs  Lab 10/07/21 0721 10/07/21 1110 10/07/21 1557 10/07/21 2122 10/08/21 1234  GLUCAP 119* 108* 144* 159* 111*   Lipid Profile: No results for input(s): CHOL, HDL, LDLCALC, TRIG, CHOLHDL, LDLDIRECT in the last 72 hours. Thyroid Function Tests: No results for input(s): TSH, T4TOTAL, FREET4, T3FREE, THYROIDAB in the last 72 hours. Anemia Panel: No results for input(s): VITAMINB12, FOLATE, FERRITIN, TIBC, IRON, RETICCTPCT in the last 72 hours. Sepsis Labs: Recent Labs  Lab 10/02/21 0124  PROCALCITON 3.09    Recent Results (from the past 240 hour(s))  Resp Panel by RT-PCR (Flu A&B, Covid) Nasopharyngeal Swab     Status: None   Collection Time: 09/30/21 11:38 AM   Specimen: Nasopharyngeal Swab; Nasopharyngeal(NP) swabs in  vial transport medium  Result Value Ref Range Status   SARS Coronavirus 2 by RT PCR NEGATIVE NEGATIVE Final    Comment: (NOTE) SARS-CoV-2 target nucleic acids are NOT DETECTED.  The SARS-CoV-2 RNA is generally detectable in upper respiratory specimens during the acute phase of infection. The lowest concentration of SARS-CoV-2 viral copies this assay can detect is 138 copies/mL. A negative result does not preclude SARS-Cov-2 infection and should not be used as the sole basis for treatment or other patient management decisions. A negative result may occur with  improper specimen collection/handling, submission of specimen other than nasopharyngeal swab, presence of viral mutation(s) within the areas targeted by this assay, and inadequate number of viral copies(<138 copies/mL). A negative result must be combined with clinical observations, patient history, and epidemiological information. The expected result is Negative.  Fact Sheet for Patients:  EntrepreneurPulse.com.au  Fact Sheet for Healthcare Providers:   IncredibleEmployment.be  This test is no t yet approved or cleared by the [place] FDA and  has been authorized for detection and/or diagnosis of SARS-CoV-2 by FDA under an Emergency Use Authorization (EUA). This EUA will remain  in effect (meaning this test can be used) for the duration of the COVID-19 declaration under Section 564(b)(1) of the Act, 21 U.S.C.section 360bbb-3(b)(1), unless the authorization is terminated  or revoked sooner.       Influenza A by PCR NEGATIVE NEGATIVE Final   Influenza B by PCR NEGATIVE NEGATIVE Final    Comment: (NOTE) The Xpert Xpress SARS-CoV-2/FLU/RSV plus assay is intended as an aid in the diagnosis of influenza from Nasopharyngeal swab specimens and should not be used as a sole basis for treatment. Nasal washings and aspirates are unacceptable for Xpert Xpress SARS-CoV-2/FLU/RSV testing.  Fact Sheet for Patients: EntrepreneurPulse.com.au  Fact Sheet for Healthcare Providers: IncredibleEmployment.be  This test is not yet approved or cleared by the [place] FDA and has been authorized for detection and/or diagnosis of SARS-CoV-2 by FDA under an Emergency Use Authorization (EUA). This EUA will remain in effect (meaning this test can be used) for the duration of the COVID-19 declaration under Section 564(b)(1) of the Act, 21 U.S.C. section 360bbb-3(b)(1), unless the authorization is terminated or revoked.  Performed at [facility], [place] [address] [place], [place] [address]   Resp Panel by RT-PCR (Flu A&B, Covid) Nasopharyngeal Swab     Status: None   Collection Time: 10/07/21  2:19 PM   Specimen: Nasopharyngeal Swab; Nasopharyngeal(NP) swabs in vial transport medium  Result Value Ref Range Status   SARS Coronavirus 2 by RT PCR NEGATIVE NEGATIVE Final    Comment: (NOTE) SARS-CoV-2 target nucleic acids are NOT DETECTED.  The SARS-CoV-2 RNA is generally detectable in  upper respiratory specimens during the acute phase of infection. The lowest concentration of SARS-CoV-2 viral copies this assay can detect is 138 copies/mL. A negative result does not preclude SARS-Cov-2 infection and should not be used as the sole basis for treatment or other patient management decisions. A negative result may occur with  improper specimen collection/handling, submission of specimen other than nasopharyngeal swab, presence of viral mutation(s) within the areas targeted by this assay, and inadequate number of viral copies(<138 copies/mL). A negative result must be combined with clinical observations, patient history, and epidemiological information. The expected result is Negative.  Fact Sheet for Patients:  EntrepreneurPulse.com.au  Fact Sheet for Healthcare Providers:  IncredibleEmployment.be  This test is no t yet approved or cleared by the [place] and  has been authorized for detection and/or diagnosis of SARS-CoV-2 by FDA under an Emergency Use Authorization (EUA). This EUA will remain  in effect (meaning this test can be used) for the duration of the COVID-19 declaration under Section 564(b)(1) of the Act, 21 U.S.C.section 360bbb-3(b)(1), unless the authorization is terminated  or revoked sooner.       Influenza A by PCR NEGATIVE NEGATIVE Final   Influenza B by PCR NEGATIVE NEGATIVE Final    Comment: (NOTE) The Xpert Xpress SARS-CoV-2/FLU/RSV plus assay is intended as an aid in the diagnosis of influenza from Nasopharyngeal swab specimens and should not be used as a sole basis for treatment. Nasal washings and aspirates are unacceptable for Xpert Xpress SARS-CoV-2/FLU/RSV testing.  Fact Sheet for Patients: EntrepreneurPulse.com.au  Fact Sheet for Healthcare Providers: IncredibleEmployment.be  This test is not yet approved or cleared by the [place] FDA and has been  authorized for detection and/or diagnosis of SARS-CoV-2 by FDA under an Emergency Use Authorization (EUA). This EUA will remain in effect (meaning this test can be used) for the duration of the COVID-19 declaration under Section 564(b)(1) of the Act, 21 U.S.C. section 360bbb-3(b)(1), unless the authorization is terminated or revoked.  Performed at [facility], [place] [address], [place], [place] [address]          Radiology Studies: IR Fluoro Guide CV Line Right  Result Date: 10/06/2021 INDICATION: 55 year old female with history of end-stage renal disease with malfunctioning upper extremity fistula. The patient elected to forego NPO status and have temporary hemodialysis catheter placed to delay fistulagram. EXAM: NON-TUNNELED CENTRAL VENOUS HEMODIALYSIS CATHETER PLACEMENT WITH ULTRASOUND AND FLUOROSCOPIC GUIDANCE COMPARISON:  None. MEDICATIONS: None FLUOROSCOPY TIME:  0 minutes, 24 seconds (2 mGy) COMPLICATIONS: None immediate. PROCEDURE: Informed written consent was obtained from the patient after a discussion of the risks, benefits, and alternatives to treatment. Questions regarding the procedure were encouraged and answered. The right neck and chest were prepped with chlorhexidine in a sterile fashion, and a sterile drape was applied covering the operative field. Maximum barrier sterile technique with sterile gowns and gloves were used for the procedure. A timeout was performed prior to the initiation of the procedure. After the overlying soft tissues were anesthetized, a small venotomy incision was created and a micropuncture kit was utilized to access the internal jugular vein. Real-time ultrasound guidance was utilized for vascular access including the acquisition of a permanent ultrasound image documenting patency of the accessed vessel. The microwire was utilized to measure appropriate catheter length. A Rosen wire was advanced to the level of the IVC. Under fluoroscopic guidance,  the venotomy was serially dilated, ultimately allowing placement of a 15 cm temporary Trialysis catheter with tip ultimately terminating within the superior aspect of the right atrium. Final catheter positioning was confirmed and documented with a spot radiographic image. The catheter aspirates and flushes normally. The catheter was flushed with appropriate volume heparin dwells. The catheter exit site was secured with a 0 silk retention suture. A dressing was placed. The patient tolerated the procedure well without immediate post procedural complication. IMPRESSION: Successful placement of a right internal jugular approach 15 cm temporary dialysis catheter with tip terminating with in the superior aspect of the right atrium. The catheter is ready for immediate use. PLAN: This catheter may be converted to a tunneled dialysis catheter at a later date as indicated. Plan for fistulagram as originally requested this admission. Ruthann Cancer, MD [facility] Radiology Electronically Signed  By: Ruthann Cancer M.D.   On: 10/06/2021 15:33   IR US Guide Vasc Access Right  Result Date: 10/06/2021 INDICATION: 55 year old female with history of end-stage renal disease with malfunctioning upper extremity fistula. The patient elected to forego NPO status and have temporary hemodialysis catheter placed to delay fistulagram. EXAM: NON-TUNNELED CENTRAL VENOUS HEMODIALYSIS CATHETER PLACEMENT WITH ULTRASOUND AND FLUOROSCOPIC GUIDANCE COMPARISON:  None. MEDICATIONS: None FLUOROSCOPY TIME:  0 minutes, 24 seconds (2 mGy) COMPLICATIONS: None immediate. PROCEDURE: Informed written consent was obtained from the patient after a discussion of the risks, benefits, and alternatives to treatment. Questions regarding the procedure were encouraged and answered. The right neck and chest were prepped with chlorhexidine in a sterile fashion, and a sterile drape was applied covering the operative  field. Maximum barrier sterile technique with sterile gowns and gloves were used for the procedure. A timeout was performed prior to the initiation of the procedure. After the overlying soft tissues were anesthetized, a small venotomy incision was created and a micropuncture kit was utilized to access the internal jugular vein. Real-time ultrasound guidance was utilized for vascular access including the acquisition of a permanent ultrasound image documenting patency of the accessed vessel. The microwire was utilized to measure appropriate catheter length. A Rosen wire was advanced to the level of the IVC. Under fluoroscopic guidance, the venotomy was serially dilated, ultimately allowing placement of a 15 cm temporary Trialysis catheter with tip ultimately terminating within the superior aspect of the right atrium. Final catheter positioning was confirmed and documented with a spot radiographic image. The catheter aspirates and flushes normally. The catheter was flushed with appropriate volume heparin dwells. The catheter exit site was secured with a 0 silk retention suture. A dressing was placed. The patient tolerated the procedure well without immediate post procedural complication. IMPRESSION: Successful placement of a right internal jugular approach 15 cm temporary dialysis catheter with tip terminating with in the superior aspect of the right atrium. The catheter is ready for immediate use. PLAN: This catheter may be converted to a tunneled dialysis catheter at a later date as indicated. Plan for fistulagram as originally requested this admission. Ruthann Cancer, MD [facility] Radiology Electronically Signed   By: Ruthann Cancer M.D.   On: 10/06/2021 15:33   IR AV DIALY SHUNT INTRO NEEDLE/INTRACATH INITIAL W/PTA/IMG RIGHT  Result Date: 10/07/2021 INDICATION: END-STAGE RENAL DISEASE, LEFT UPPER ARM CHRONIC AV FISTULA, PREVIOUSLY STENTED PERIPHERALLY. DECREASED  ACCESS FLOW RATES. EXAM: ULTRASOUND GUIDANCE FOR VASCULAR ACCESS LEFT UPPER ARM AV FISTULOGRAM PERIPHERAL 7 MM VENOUS ANGIOPLASTY (OF A PREVIOUSLY STENTED SEGMENT) MEDICATIONS: 1% LIDOCAINE LOCAL ANESTHESIA/SEDATION: Moderate (conscious) sedation was employed during this procedure. A total of Versed 0.5 mg and Fentanyl 25 mcg was administered intravenously by the radiology nurse. Total intra-service moderate Sedation Time: 15 minutes. The patient's level of consciousness and vital signs were monitored continuously by radiology nursing throughout the procedure under my direct supervision. FLUOROSCOPY TIME:  Fluoroscopy Time: 1 minutes 30 seconds (18 mGy). COMPLICATIONS: None immediate. PROCEDURE: Informed written consent was obtained from the patient after a thorough discussion of the procedural risks, benefits and alternatives. All questions were addressed. Maximal Sterile Barrier Technique was utilized including caps, mask, sterile gowns, sterile gloves, sterile drape, hand hygiene and skin antiseptic. A timeout was performed prior to the initiation of the procedure. Previous imaging reviewed. Ultrasound micropuncture access performed of the patent left upper arm fistula close to the arterial anastomosis at the elbow. Images obtained for documentation of the  patent fistula. The guidewire advanced followed by the micro dilator set. Initial outflow venogram performed. Left upper arm fistulagram: Fistula is patent. 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 In the upper humerus level, there is a peripheral stent with intimal hyperplasia at both ends resulting in tandem stenoses estimated at 50-70%. This results in flow limitation centrally. Central venogram confirms patency of the central veins. However, there is sluggish venous flow centrally because of in stent stenoses peripherally. Peripheral 7 mm angioplasty: 6 French sheath inserted. Guidewire access confirmed across the stented segment. Overlapping 7 mm  angioplasty performed with a 7 x 4 mustang balloon. Balloon was inflated to 20 atmospheres for 1 minute inter overlapping fashion. Two inflations performed. Following angioplasty, repeat shuntogram demonstrates improved patency of the stent with only minor residual narrowing, less than 30%. There is improved venous outflow centrally following angioplasty. Central veins were re-examine and confirmed to be widely patent. Access removed. Hemostasis obtained with pursestring suture. No immediate complication. Patient tolerated the procedure well. IMPRESSION: Fistulagram confirms peripheral in stent stenoses at both ends of the stent estimated at 50-70%. This responded to 7 mm overlapping angioplasty as above with very minimal residual narrowing. Following angioplasty there is improved venous outflow centrally. ACCESS: This access remains amenable to future percutaneous interventions as clinically indicated. Electronically Signed   By: Jerilynn Mages.  Shick M.D.   On: 10/07/2021 10:59        Scheduled Meds:  amLODipine  10 mg Oral Daily   atropine  1 drop Both Eyes BID   calcium acetate  2,001 mg Oral TID WC   carvedilol  6.25 mg Oral BID WC   Chlorhexidine Gluconate Cloth  6 each Topical Q0600   diclofenac Sodium  2 g Topical QID   dorzolamide-timolol  1 drop Both Eyes BID   furosemide  40 mg Oral Daily   gabapentin  600 mg Oral BID   heparin  5,000 Units Subcutaneous Q8H   hydrALAZINE  100 mg Oral Q8H   insulin aspart  0-5 Units Subcutaneous QHS   insulin aspart  0-6 Units Subcutaneous TID WC   lamoTRIgine  25 mg Oral Daily   pravastatin  80 mg Oral q1800   Continuous Infusions:   LOS: 7 days    Time spent: 15 mins.More than 50% of that time was spent in counseling and/or coordination of care.      Shelly Coss, MD [facility] P12/15/2022, 12:49 PM

## 2021-10-08 NOTE — Progress Notes (Signed)
Occupational Therapy Treatment Patient Details Name: Tammy Moses MRN: [id number] DOB: [birth year] Today's Date: 10/08/2021   History of present illness 55 year old African-American female presented to the ER today via EMS with hypoxia and respiratory distress, +++ BP. PMH with a history of end-stage renal disease needing dialysis on Monday Wednesday Friday with a history of noncompliance, type 2 diabetes, hypertension, anemia chronic disease, COPD, R eye blindness, diabetic retinopathy, neuropathy, chronic tobacco abuse (Simultaneous filing. User may not have seen previous data.)   OT comments  Pt progressing towards established OT goals. Pt performing functional mobility to/from sink with Min Guard A and RW. Pt completing oral care and washing her face while standing at sink with Min A for managing visual impairments. Continue to present with decreased balance, safety, and strength. Will continue to follow acutely and continue to recommend dc to SNF.    Recommendations for follow up therapy are one component of a multi-disciplinary discharge planning process, led by the attending physician.  Recommendations may be updated based on patient status, additional functional criteria and insurance authorization.    Follow Up Recommendations  Skilled nursing-short term rehab (<3 hours/day)    Assistance Recommended at Discharge Frequent or constant Supervision/Assistance  Equipment Recommendations  Other (comment) (TBD at SNF)    Recommendations for Other Services      Precautions / Restrictions Precautions Precautions: Fall (Simultaneous filing. User may not have seen previous data.) Precaution Comments: blind, Rt foot drop (Simultaneous filing. User may not have seen previous data.) Restrictions Weight Bearing Restrictions: No       Mobility Bed Mobility Overal bed mobility: Modified Independent       Supine to sit: Modified independent (Device/Increase time);HOB elevated      General bed mobility comments: Increased time (Simultaneous filing. User may not have seen previous data.)    Transfers Overall transfer level: Needs assistance (Simultaneous filing. User may not have seen previous data.) Equipment used: Rolling walker (2 wheels) (Simultaneous filing. User may not have seen previous data.) Transfers: Sit to/from Stand (Simultaneous filing. User may not have seen previous data.) Sit to Stand: Min guard (Simultaneous filing. User may not have seen previous data.)           General transfer comment: assist for balance (Simultaneous filing. User may not have seen previous data.)     Balance Overall balance assessment: Needs assistance Sitting-balance support: No upper extremity supported;Feet supported Sitting balance-Leahy Scale: Good     Standing balance support: Bilateral upper extremity supported;Single extremity supported Standing balance-Leahy Scale: Fair                             ADL either performed or assessed with clinical judgement   ADL Overall ADL's : Needs assistance/impaired     Grooming: Oral care;Minimal assistance;Supervision/safety;Standing Grooming Details (indicate cue type and reason): Min A for managing vision impairments. supervision for safety in standing                 Toilet Transfer: Min guard;Ambulation;Rolling walker (2 wheels) (simulated in room)           Functional mobility during ADLs: Min guard;Rolling walker (2 wheels) General ADL Comments: Min Guard A for safety    Extremity/Trunk Assessment Upper Extremity Assessment Upper Extremity Assessment: Generalized weakness   Lower Extremity Assessment Lower Extremity Assessment: Defer to PT evaluation RLE Deficits / Details: 0/5 dorsiflexion, otherwise 4/5 RLE Sensation: decreased light touch;history of peripheral neuropathy LLE  Deficits / Details: decreased AROM ankle DF        Vision   Vision Assessment?: Vision impaired- to  be further tested in functional context Additional Comments: Able to see more with increased light   Perception     Praxis      Cognition Arousal/Alertness: Awake/alert (Simultaneous filing. User may not have seen previous data.) Behavior During Therapy: [facility] for tasks assessed/performed (Simultaneous filing. User may not have seen previous data.) Overall Cognitive Status: No family/caregiver present to determine baseline cognitive functioning (Simultaneous filing. User may not have seen previous data.)                                 General Comments: Very willing to participate. Decreased safety awareness (Simultaneous filing. User may not have seen previous data.)          Exercises    Shoulder Instructions       General Comments      Pertinent Vitals/ Pain       Pain Assessment: Faces (Simultaneous filing. User may not have seen previous data.) Faces Pain Scale: Hurts little more (Simultaneous filing. User may not have seen previous data.) Pain Location: R foot (Simultaneous filing. User may not have seen previous data.) Pain Descriptors / Indicators: Sore (Simultaneous filing. User may not have seen previous data.) Pain Intervention(s): Monitored during session;Limited activity within patient's tolerance;Repositioned (Simultaneous filing. User may not have seen previous data.)  Home Living                                          Prior Functioning/Environment              Frequency  Min 2X/week        Progress Toward Goals  OT Goals(current goals can now be found in the care plan section)  Progress towards OT goals: Progressing toward goals  Acute Rehab OT Goals Patient Stated Goal: Agreeable to rehab OT Goal Formulation: With patient Time For Goal Achievement: 10/17/21 Potential to Achieve Goals: Good ADL Goals Pt Will Perform Grooming: with supervision;with set-up;standing Pt Will Perform Lower Body Bathing: with min  assist;with min guard assist Pt Will Perform Lower Body Dressing: with min assist;with min guard assist Pt Will Transfer to Toilet: with min guard assist;with supervision;ambulating Pt Will Perform Toileting - Clothing Manipulation and hygiene: with min guard assist;with supervision Additional ADL Goal #1: Pt will follow simple commands 100% of the time with no cuing.  Plan Discharge plan remains appropriate;Frequency remains appropriate    Co-evaluation                 AM-PAC OT "6 Clicks" Daily Activity     Outcome Measure   Help from another person eating meals?: None Help from another person taking care of personal grooming?: A Little Help from another person toileting, which includes using toliet, bedpan, or urinal?: A Little Help from another person bathing (including washing, rinsing, drying)?: A Little Help from another person to put on and taking off regular upper body clothing?: A Little Help from another person to put on and taking off regular lower body clothing?: A Lot 6 Click Score: 18    End of Session Equipment Utilized During Treatment: Rolling walker (2 wheels)  OT Visit Diagnosis: Unsteadiness on feet (R26.81);Other abnormalities of gait and mobility (  R26.89);Muscle weakness (generalized) (M62.81)   Activity Tolerance Patient tolerated treatment well   Patient Left in bed;with call bell/phone within reach;with bed alarm set   Nurse Communication Mobility status        Time: 8638-1771 OT Time Calculation (min): 12 min  Charges: OT General Charges $OT Visit: 1 Visit OT Treatments $Self Care/Home Management : 8-22 mins  [place], OTR/L Acute Rehab Pager: [phone] Office: [place] 10/08/2021, 4:06 PM

## 2021-10-08 NOTE — Progress Notes (Signed)
[facility] ROUNDING NOTE   Subjective:   Interval History: 55 year old lady end-stage renal disease Monday Wednesday Friday dialysis.  Admitted to the hospital with acute pulmonary edema and shortness of breath improved with dialysis and ultrafiltration.  Has poor living situation and therefore needs to be placed in SNF.  Clotted dialysis access 10/05/2021.  Discussed with interventional radiology.  Patient underwent dialysis 10/07/2021 with 2.8 L removed.  Next dialysis treatment will be 10/09/2021  Blood pressure 127/47 pulse 74 temperature 97.8 O2 sats 9% room air  Sodium 132 potassium 3.8 chloride 92 CO2 25 BUN 45 creatinine 7.57 glucose 129 calcium 8.9  Status post declot 10/07/2021     Objective:  Vital signs in last 24 hours:  Temp:  [97.8 F (36.6 C)-98 F (36.7 C)] 97.8 F (36.6 C) (12/15 0436) Pulse Rate:  [75-78] 75 (12/15 0436) Resp:  [16] 16 (12/15 0436) BP: (129-166)/(53-58) 166/53 (12/15 0436) SpO2:  [98 %-99 %] 99 % (12/15 0436) Weight:  [56.8 kg] 56.8 kg (12/15 0436)  Weight change: -4.564 kg Filed Weights   10/07/21 0300 10/07/21 0315 10/08/21 0436  Weight: 58.6 kg 58.6 kg 56.8 kg    Intake/Output: I/O last 3 completed shifts: In: 58 [P.O.:720] Out: 3200 [Urine:400; Other:2800]   Intake/Output this shift:  Total I/O In: 240 [P.O.:240] Out: -   CVS- RRR RS- CTA ABD- BS present soft non-distended EXT- no edema left upper arm AV fistula     Basic Metabolic Panel: Recent Labs  Lab 10/02/21 0153 10/05/21 0824 10/06/21 0303 10/08/21 0203  NA 136 126* 125* 132*  K 4.3 5.0 5.5* 3.8  CL 95* 93* 91* 92*  CO2 23 19* 18* 25  GLUCOSE 92 224* 94 129*  BUN 65* 76* 92* 45*  CREATININE 10.14* 10.46* 12.45* 7.57*  CALCIUM 7.9* 8.9 9.2 8.9  PHOS 6.6* 5.1*  --   --      Liver Function Tests: Recent Labs  Lab 10/02/21 0153 10/05/21 0824  ALBUMIN 2.4* 2.7*    No results for input(s): LIPASE, AMYLASE in the last 168 hours. No  results for input(s): AMMONIA in the last 168 hours.  CBC: Recent Labs  Lab 10/01/21 1548 10/05/21 0824 10/06/21 0303  WBC 19.4* 9.8 10.7*  HGB 10.6* 9.3* 9.0*  HCT 34.0* 29.5* 28.6*  MCV 91.4 89.9 89.9  PLT 444* 282 278     Cardiac Enzymes: No results for input(s): CKTOTAL, CKMB, CKMBINDEX, TROPONINI in the last 168 hours.  BNP: Invalid input(s): POCBNP  CBG: Recent Labs  Lab 10/07/21 0325 10/07/21 0721 10/07/21 1110 10/07/21 1557 10/07/21 2122  GLUCAP 140* 119* 108* 144* 159*     Microbiology: Results for orders placed or performed during the hospital encounter of 09/30/21  Resp Panel by RT-PCR (Flu A&B, Covid) Nasopharyngeal Swab     Status: None   Collection Time: 09/30/21 11:38 AM   Specimen: Nasopharyngeal Swab; Nasopharyngeal(NP) swabs in vial transport medium  Result Value Ref Range Status   SARS Coronavirus 2 by RT PCR NEGATIVE NEGATIVE Final    Comment: (NOTE) SARS-CoV-2 target nucleic acids are NOT DETECTED.  The SARS-CoV-2 RNA is generally detectable in upper respiratory specimens during the acute phase of infection. The lowest concentration of SARS-CoV-2 viral copies this assay can detect is 138 copies/mL. A negative result does not preclude SARS-Cov-2 infection and should not be used as the sole basis for treatment or other patient management decisions. A negative result may occur with  improper specimen collection/handling, submission of specimen  other than nasopharyngeal swab, presence of viral mutation(s) within the areas targeted by this assay, and inadequate number of viral copies(<138 copies/mL). A negative result must be combined with clinical observations, patient history, and epidemiological information. The expected result is Negative.  Fact Sheet for Patients:  EntrepreneurPulse.com.au  Fact Sheet for Healthcare Providers:  IncredibleEmployment.be  This test is no t yet approved or cleared by  the [place] FDA and  has been authorized for detection and/or diagnosis of SARS-CoV-2 by FDA under an Emergency Use Authorization (EUA). This EUA will remain  in effect (meaning this test can be used) for the duration of the COVID-19 declaration under Section 564(b)(1) of the Act, 21 U.S.C.section 360bbb-3(b)(1), unless the authorization is terminated  or revoked sooner.       Influenza A by PCR NEGATIVE NEGATIVE Final   Influenza B by PCR NEGATIVE NEGATIVE Final    Comment: (NOTE) The Xpert Xpress SARS-CoV-2/FLU/RSV plus assay is intended as an aid in the diagnosis of influenza from Nasopharyngeal swab specimens and should not be used as a sole basis for treatment. Nasal washings and aspirates are unacceptable for Xpert Xpress SARS-CoV-2/FLU/RSV testing.  Fact Sheet for Patients: EntrepreneurPulse.com.au  Fact Sheet for Healthcare Providers: IncredibleEmployment.be  This test is not yet approved or cleared by the [place] FDA and has been authorized for detection and/or diagnosis of SARS-CoV-2 by FDA under an Emergency Use Authorization (EUA). This EUA will remain in effect (meaning this test can be used) for the duration of the COVID-19 declaration under Section 564(b)(1) of the Act, 21 U.S.C. section 360bbb-3(b)(1), unless the authorization is terminated or revoked.  Performed at [facility], [place] [address], [place], [place] [address]   Resp Panel by RT-PCR (Flu A&B, Covid) Nasopharyngeal Swab     Status: None   Collection Time: 10/07/21  2:19 PM   Specimen: Nasopharyngeal Swab; Nasopharyngeal(NP) swabs in vial transport medium  Result Value Ref Range Status   SARS Coronavirus 2 by RT PCR NEGATIVE NEGATIVE Final    Comment: (NOTE) SARS-CoV-2 target nucleic acids are NOT DETECTED.  The SARS-CoV-2 RNA is generally detectable in upper respiratory specimens during the acute phase of infection. The lowest concentration  of SARS-CoV-2 viral copies this assay can detect is 138 copies/mL. A negative result does not preclude SARS-Cov-2 infection and should not be used as the sole basis for treatment or other patient management decisions. A negative result may occur with  improper specimen collection/handling, submission of specimen other than nasopharyngeal swab, presence of viral mutation(s) within the areas targeted by this assay, and inadequate number of viral copies(<138 copies/mL). A negative result must be combined with clinical observations, patient history, and epidemiological information. The expected result is Negative.  Fact Sheet for Patients:  EntrepreneurPulse.com.au  Fact Sheet for Healthcare Providers:  IncredibleEmployment.be  This test is no t yet approved or cleared by the [place] FDA and  has been authorized for detection and/or diagnosis of SARS-CoV-2 by FDA under an Emergency Use Authorization (EUA). This EUA will remain  in effect (meaning this test can be used) for the duration of the COVID-19 declaration under Section 564(b)(1) of the Act, 21 U.S.C.section 360bbb-3(b)(1), unless the authorization is terminated  or revoked sooner.       Influenza A by PCR NEGATIVE NEGATIVE Final   Influenza B by PCR NEGATIVE NEGATIVE Final    Comment: (NOTE) The Xpert Xpress SARS-CoV-2/FLU/RSV plus assay is intended as an aid in the diagnosis of influenza from Nasopharyngeal  swab specimens and should not be used as a sole basis for treatment. Nasal washings and aspirates are unacceptable for Xpert Xpress SARS-CoV-2/FLU/RSV testing.  Fact Sheet for Patients: EntrepreneurPulse.com.au  Fact Sheet for Healthcare Providers: IncredibleEmployment.be  This test is not yet approved or cleared by the [place] FDA and has been authorized for detection and/or diagnosis of SARS-CoV-2 by FDA under an Emergency Use  Authorization (EUA). This EUA will remain in effect (meaning this test can be used) for the duration of the COVID-19 declaration under Section 564(b)(1) of the Act, 21 U.S.C. section 360bbb-3(b)(1), unless the authorization is terminated or revoked.  Performed at [facility], [place] [address], [place], [place] [address]     Coagulation Studies: No results for input(s): LABPROT, INR in the last 72 hours.  Urinalysis: No results for input(s): COLORURINE, LABSPEC, PHURINE, GLUCOSEU, HGBUR, BILIRUBINUR, KETONESUR, PROTEINUR, UROBILINOGEN, NITRITE, LEUKOCYTESUR in the last 72 hours.  Invalid input(s): APPERANCEUR    Imaging: IR Fluoro Guide CV Line Right  Result Date: 10/06/2021 INDICATION: 55 year old female with history of end-stage renal disease with malfunctioning upper extremity fistula. The patient elected to forego NPO status and have temporary hemodialysis catheter placed to delay fistulagram. EXAM: NON-TUNNELED CENTRAL VENOUS HEMODIALYSIS CATHETER PLACEMENT WITH ULTRASOUND AND FLUOROSCOPIC GUIDANCE COMPARISON:  None. MEDICATIONS: None FLUOROSCOPY TIME:  0 minutes, 24 seconds (2 mGy) COMPLICATIONS: None immediate. PROCEDURE: Informed written consent was obtained from the patient after a discussion of the risks, benefits, and alternatives to treatment. Questions regarding the procedure were encouraged and answered. The right neck and chest were prepped with chlorhexidine in a sterile fashion, and a sterile drape was applied covering the operative field. Maximum barrier sterile technique with sterile gowns and gloves were used for the procedure. A timeout was performed prior to the initiation of the procedure. After the overlying soft tissues were anesthetized, a small venotomy incision was created and a micropuncture kit was utilized to access the internal jugular vein. Real-time ultrasound guidance was utilized for vascular access including the acquisition of a permanent ultrasound  image documenting patency of the accessed vessel. The microwire was utilized to measure appropriate catheter length. A Rosen wire was advanced to the level of the IVC. Under fluoroscopic guidance, the venotomy was serially dilated, ultimately allowing placement of a 15 cm temporary Trialysis catheter with tip ultimately terminating within the superior aspect of the right atrium. Final catheter positioning was confirmed and documented with a spot radiographic image. The catheter aspirates and flushes normally. The catheter was flushed with appropriate volume heparin dwells. The catheter exit site was secured with a 0 silk retention suture. A dressing was placed. The patient tolerated the procedure well without immediate post procedural complication. IMPRESSION: Successful placement of a right internal jugular approach 15 cm temporary dialysis catheter with tip terminating with in the superior aspect of the right atrium. The catheter is ready for immediate use. PLAN: This catheter may be converted to a tunneled dialysis catheter at a later date as indicated. Plan for fistulagram as originally requested this admission. Ruthann Cancer, MD [facility] Radiology Electronically Signed   By: Ruthann Cancer M.D.   On: 10/06/2021 15:33   IR US Guide Vasc Access Right  Result Date: 10/06/2021 INDICATION: 55 year old female with history of end-stage renal disease with malfunctioning upper extremity fistula. The patient elected to forego NPO status and have temporary hemodialysis catheter placed to delay fistulagram. EXAM: NON-TUNNELED CENTRAL VENOUS HEMODIALYSIS CATHETER PLACEMENT WITH ULTRASOUND AND FLUOROSCOPIC GUIDANCE  COMPARISON:  None. MEDICATIONS: None FLUOROSCOPY TIME:  0 minutes, 24 seconds (2 mGy) COMPLICATIONS: None immediate. PROCEDURE: Informed written consent was obtained from the patient after a discussion of the risks, benefits, and alternatives to treatment.  Questions regarding the procedure were encouraged and answered. The right neck and chest were prepped with chlorhexidine in a sterile fashion, and a sterile drape was applied covering the operative field. Maximum barrier sterile technique with sterile gowns and gloves were used for the procedure. A timeout was performed prior to the initiation of the procedure. After the overlying soft tissues were anesthetized, a small venotomy incision was created and a micropuncture kit was utilized to access the internal jugular vein. Real-time ultrasound guidance was utilized for vascular access including the acquisition of a permanent ultrasound image documenting patency of the accessed vessel. The microwire was utilized to measure appropriate catheter length. A Rosen wire was advanced to the level of the IVC. Under fluoroscopic guidance, the venotomy was serially dilated, ultimately allowing placement of a 15 cm temporary Trialysis catheter with tip ultimately terminating within the superior aspect of the right atrium. Final catheter positioning was confirmed and documented with a spot radiographic image. The catheter aspirates and flushes normally. The catheter was flushed with appropriate volume heparin dwells. The catheter exit site was secured with a 0 silk retention suture. A dressing was placed. The patient tolerated the procedure well without immediate post procedural complication. IMPRESSION: Successful placement of a right internal jugular approach 15 cm temporary dialysis catheter with tip terminating with in the superior aspect of the right atrium. The catheter is ready for immediate use. PLAN: This catheter may be converted to a tunneled dialysis catheter at a later date as indicated. Plan for fistulagram as originally requested this admission. Ruthann Cancer, MD [facility] Radiology Electronically Signed   By: Ruthann Cancer M.D.   On: 10/06/2021 15:33   IR AV  DIALY SHUNT INTRO NEEDLE/INTRACATH INITIAL W/PTA/IMG RIGHT  Result Date: 10/07/2021 INDICATION: END-STAGE RENAL DISEASE, LEFT UPPER ARM CHRONIC AV FISTULA, PREVIOUSLY STENTED PERIPHERALLY. DECREASED ACCESS FLOW RATES. EXAM: ULTRASOUND GUIDANCE FOR VASCULAR ACCESS LEFT UPPER ARM AV FISTULOGRAM PERIPHERAL 7 MM VENOUS ANGIOPLASTY (OF A PREVIOUSLY STENTED SEGMENT) MEDICATIONS: 1% LIDOCAINE LOCAL ANESTHESIA/SEDATION: Moderate (conscious) sedation was employed during this procedure. A total of Versed 0.5 mg and Fentanyl 25 mcg was administered intravenously by the radiology nurse. Total intra-service moderate Sedation Time: 15 minutes. The patient's level of consciousness and vital signs were monitored continuously by radiology nursing throughout the procedure under my direct supervision. FLUOROSCOPY TIME:  Fluoroscopy Time: 1 minutes 30 seconds (18 mGy). COMPLICATIONS: None immediate. PROCEDURE: Informed written consent was obtained from the patient after a thorough discussion of the procedural risks, benefits and alternatives. All questions were addressed. Maximal Sterile Barrier Technique was utilized including caps, mask, sterile gowns, sterile gloves, sterile drape, hand hygiene and skin antiseptic. A timeout was performed prior to the initiation of the procedure. Previous imaging reviewed. Ultrasound micropuncture access performed of the patent left upper arm fistula close to the arterial anastomosis at the elbow. Images obtained for documentation of the patent fistula. The guidewire advanced followed by the micro dilator set. Initial outflow venogram performed. Left upper arm fistulagram: Fistula is patent. 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 In the upper humerus level, there is a peripheral stent with intimal hyperplasia at both ends resulting in tandem stenoses estimated at 50-70%. This results in flow limitation centrally. Central venogram confirms patency of the  central veins. However, there is  sluggish venous flow centrally because of in stent stenoses peripherally. Peripheral 7 mm angioplasty: 6 French sheath inserted. Guidewire access confirmed across the stented segment. Overlapping 7 mm angioplasty performed with a 7 x 4 mustang balloon. Balloon was inflated to 20 atmospheres for 1 minute inter overlapping fashion. Two inflations performed. Following angioplasty, repeat shuntogram demonstrates improved patency of the stent with only minor residual narrowing, less than 30%. There is improved venous outflow centrally following angioplasty. Central veins were re-examine and confirmed to be widely patent. Access removed. Hemostasis obtained with pursestring suture. No immediate complication. Patient tolerated the procedure well. IMPRESSION: Fistulagram confirms peripheral in stent stenoses at both ends of the stent estimated at 50-70%. This responded to 7 mm overlapping angioplasty as above with very minimal residual narrowing. Following angioplasty there is improved venous outflow centrally. ACCESS: This access remains amenable to future percutaneous interventions as clinically indicated. Electronically Signed   By: Jerilynn Mages.  Shick M.D.   On: 10/07/2021 10:59     Medications:      amLODipine  10 mg Oral Daily   atropine  1 drop Both Eyes BID   calcium acetate  2,001 mg Oral TID WC   carvedilol  6.25 mg Oral BID WC   Chlorhexidine Gluconate Cloth  6 each Topical Daily   diclofenac Sodium  2 g Topical QID   dorzolamide-timolol  1 drop Both Eyes BID   furosemide  40 mg Oral Daily   gabapentin  600 mg Oral BID   heparin  5,000 Units Subcutaneous Q8H   hydrALAZINE  100 mg Oral Q8H   insulin aspart  0-5 Units Subcutaneous QHS   insulin aspart  0-6 Units Subcutaneous TID WC   lamoTRIgine  25 mg Oral Daily   pravastatin  80 mg Oral q1800   acetaminophen **OR** acetaminophen, albuterol, loperamide, ondansetron **OR** ondansetron (ZOFRAN) IV, oxyCODONE  Assessment/ Plan:  [facility]  Monday Wednesday Friday.  Outpatient orders 2-1/2 hours F1 80 2K 2.5 calcium.  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  Fistulogram clotted access.  Underwent declot plan dialysis 10/09/2021.  May discharge after dialysis.  Patient will need to make sure her access is functioning prior to discharge and temporary dialysis catheter removed Hyperkalemia resolved with dialysis. Awaiting nursing home placement   LOS: [place] @TODAY @11 :07 AM

## 2021-10-08 NOTE — TOC Progression Note (Addendum)
Transition of Care [facility]) - Progression Note    Patient Details  Name: Tammy Moses MRN: [id number] Date of Birth: 1966/07/08  Transition of Care [facility]) CM/SW [place], [place] Phone Number: 10/08/2021, 10:24 AM  Clinical Narrative:     Update-3:22pm- CSW received call from [facility] renal navigator. Olivia Mackie informed CSW that patient will have HD tomorrow here at [facility]. Patient will now start HD at [facility] on Tuesday. Arrival time is at 6:15am for 6:30am seat time. CSW informed Bryson Ha with [facility]. Patient informed CSW she has no clothes to change into at SNF.Patient requested pants and long sleeve shirt from CSW . CSW brought patient clothes requested from SW closet. Patient thanked CSW.CSW will continue to follow.  Update- MD informed CSW Anticipated dc is for tomorrow. CSW informed Bryson Ha with [facility]. Bryson Ha with [facility] confirmed patient can dc over tomorrow if medically ready. [place] renal navigator informed.CSW will continue to follow.   CSW received insurance authorization approval for patient. Plan auth ID# [id number] [place] ID# [id number]. Insurance auth approved from 12/15-12/19.CSW spoke with Bryson Ha with [facility] who confirmed patient can dc over today if medically ready. CSW informed MD. CSW will continue to follow and assist with patients dc planning needs.  Expected Discharge Plan: Skilled Nursing Facility Barriers to Discharge: Ship broker, Continued Medical Work up, SNF Pending bed offer  Expected Discharge Plan and Services Expected Discharge Plan: [place] arrangements for the past 2 months: Apartment                                       Social Determinants of Health (SDOH) Interventions    Readmission Risk Interventions No flowsheet data found.

## 2021-10-09 LAB — CBC
HCT: 25.2 % — ABNORMAL LOW (ref 36.0–46.0)
Hemoglobin: 7.8 g/dL — ABNORMAL LOW (ref 12.0–15.0)
MCH: 28.8 pg (ref 26.0–34.0)
MCHC: 31 g/dL (ref 30.0–36.0)
MCV: 93 fL (ref 80.0–100.0)
Platelets: 291 10*3/uL (ref 150–400)
RBC: 2.71 MIL/uL — ABNORMAL LOW (ref 3.87–5.11)
RDW: 16.7 % — ABNORMAL HIGH (ref 11.5–15.5)
WBC: 10.5 10*3/uL (ref 4.0–10.5)
nRBC: 0 % (ref 0.0–0.2)

## 2021-10-09 LAB — RENAL FUNCTION PANEL
Albumin: 2.7 g/dL — ABNORMAL LOW (ref 3.5–5.0)
Anion gap: 11 (ref 5–15)
BUN: 62 mg/dL — ABNORMAL HIGH (ref 6–20)
CO2: 23 mmol/L (ref 22–32)
Calcium: 9 mg/dL (ref 8.9–10.3)
Chloride: 95 mmol/L — ABNORMAL LOW (ref 98–111)
Creatinine, Ser: 9.22 mg/dL — ABNORMAL HIGH (ref 0.44–1.00)
GFR, Estimated: 5 mL/min — ABNORMAL LOW (ref >60)
Glucose, Bld: 129 mg/dL — ABNORMAL HIGH (ref 70–99)
Phosphorus: 5.2 mg/dL — ABNORMAL HIGH (ref 2.5–4.6)
Potassium: 4.9 mmol/L (ref 3.5–5.1)
Sodium: 129 mmol/L — ABNORMAL LOW (ref 135–145)

## 2021-10-09 LAB — GLUCOSE, CAPILLARY
Glucose-Capillary: 111 mg/dL — ABNORMAL HIGH (ref 70–99)
Glucose-Capillary: 108 mg/dL — ABNORMAL HIGH (ref 70–99)
Glucose-Capillary: 145 mg/dL — ABNORMAL HIGH (ref 70–99)

## 2021-10-09 MED ORDER — OXYCODONE HCL 5 MG PO TABS: 5.0000 mg | ORAL_TABLET | Freq: Four times a day (QID) | ORAL | 0 refills | Status: DC | PRN

## 2021-10-09 MED ORDER — PENTAFLUOROPROP-TETRAFLUOROETH EX AERO: 1.0000 "application " | INHALATION_SPRAY | CUTANEOUS | Status: DC | PRN

## 2021-10-09 MED ORDER — HYDRALAZINE HCL 100 MG PO TABS: 100.0000 mg | ORAL_TABLET | Freq: Three times a day (TID) | ORAL | Status: DC

## 2021-10-09 MED ORDER — LIDOCAINE-PRILOCAINE 2.5-2.5 % EX CREA: 1.0000 "application " | TOPICAL_CREAM | CUTANEOUS | Status: DC | PRN

## 2021-10-09 MED ORDER — SODIUM CHLORIDE 0.9 % IV SOLN: 100.0000 mL | INTRAVENOUS | Status: DC | PRN

## 2021-10-09 MED ORDER — HEPARIN SODIUM (PORCINE) 1000 UNIT/ML DIALYSIS: 1000.0000 [IU] | INTRAMUSCULAR | Status: DC | PRN

## 2021-10-09 MED ORDER — LIDOCAINE HCL (PF) 1 % IJ SOLN: 5.0000 mL | INTRAMUSCULAR | Status: DC | PRN

## 2021-10-09 MED ORDER — ALTEPLASE 2 MG IJ SOLR: 2.0000 mg | Freq: Once | INTRAMUSCULAR | Status: DC | PRN

## 2021-10-09 NOTE — Consult Note (Signed)
° °  [facility] CM Inpatient Consult   10/09/2021  INDY PRESTWOOD 1966-10-12 [id number]  Follow up: pending   [place] [facility] [ACO] Patient: [employer] with patient regarding SNF, states she will need to change PCS services as her aid was coming at 3 am.  She is ready for rehab at [facility]  She has HD on MWF.  A brochure was given with 24 hour nurse advise line information.  Natividad Brood, RN BSN [facility] Liaison  [phone] business mobile phone Toll free office [phone]  Fax number: [phone] [email or web address]

## 2021-10-09 NOTE — Progress Notes (Signed)
[facility] ROUNDING NOTE   Subjective:   Interval History: 55 year old lady end-stage renal disease Monday Wednesday Friday dialysis.  Admitted to the hospital with acute pulmonary edema and shortness of breath improved with dialysis and ultrafiltration.  Has poor living situation and therefore needs to be placed in SNF.  Clotted dialysis access 10/05/2021.  Status post declot.  Dialysis 10/09/2021  Blood pressure 158/55 pulse 75 temperature 98.1 O2 sats 98% room  Sodium 129 potassium 4.9 chloride 95 CO2 23 BUN 60 creatinine 9.2 glucose 129 hemoglobin 7.8  Status post declot 10/07/2021.  For DC today fistula working well.  Please remove temporary dialysis catheter     Objective:  Vital signs in last 24 hours:  Temp:  [98.1 F (36.7 C)-98.4 F (36.9 C)] 98.1 F (36.7 C) (12/16 0730) Pulse Rate:  [75-79] 75 (12/16 1030) Resp:  [12-18] 12 (12/16 0830) BP: (133-168)/(51-67) 158/55 (12/16 1030) SpO2:  [97 %-100 %] 98 % (12/16 0830) Weight:  [57.4 kg-57.5 kg] 57.4 kg (12/16 0730)  Weight change: 0.635 kg Filed Weights   10/08/21 0436 10/09/21 0556 10/09/21 0730  Weight: 56.8 kg 57.5 kg 57.4 kg    Intake/Output: I/O last 3 completed shifts: In: 837 [P.O.:837] Out: 400 [Urine:400]   Intake/Output this shift:  No intake/output data recorded.  CVS- RRR RS- CTA ABD- BS present soft non-distended EXT- no edema left upper arm AV fistula     Basic Metabolic Panel: Recent Labs  Lab 10/05/21 0824 10/06/21 0303 10/08/21 0203 10/09/21 0805  NA 126* 125* 132* 129*  K 5.0 5.5* 3.8 4.9  CL 93* 91* 92* 95*  CO2 19* 18* 25 23  GLUCOSE 224* 94 129* 129*  BUN 76* 92* 45* 62*  CREATININE 10.46* 12.45* 7.57* 9.22*  CALCIUM 8.9 9.2 8.9 9.0  PHOS 5.1*  --   --  5.2*     Liver Function Tests: Recent Labs  Lab 10/05/21 0824 10/09/21 0805  ALBUMIN 2.7* 2.7*    No results for input(s): LIPASE, AMYLASE in the last 168 hours. No results for input(s): AMMONIA  in the last 168 hours.  CBC: Recent Labs  Lab 10/05/21 0824 10/06/21 0303 10/09/21 0806  WBC 9.8 10.7* 10.5  HGB 9.3* 9.0* 7.8*  HCT 29.5* 28.6* 25.2*  MCV 89.9 89.9 93.0  PLT 282 278 291     Cardiac Enzymes: No results for input(s): CKTOTAL, CKMB, CKMBINDEX, TROPONINI in the last 168 hours.  BNP: Invalid input(s): POCBNP  CBG: Recent Labs  Lab 10/07/21 2122 10/08/21 1234 10/08/21 1613 10/08/21 2155 10/09/21 0711  GLUCAP 159* 111* 143* 160* 111*     Microbiology: Results for orders placed or performed during the hospital encounter of 09/30/21  Resp Panel by RT-PCR (Flu A&B, Covid) Nasopharyngeal Swab     Status: None   Collection Time: 09/30/21 11:38 AM   Specimen: Nasopharyngeal Swab; Nasopharyngeal(NP) swabs in vial transport medium  Result Value Ref Range Status   SARS Coronavirus 2 by RT PCR NEGATIVE NEGATIVE Final    Comment: (NOTE) SARS-CoV-2 target nucleic acids are NOT DETECTED.  The SARS-CoV-2 RNA is generally detectable in upper respiratory specimens during the acute phase of infection. The lowest concentration of SARS-CoV-2 viral copies this assay can detect is 138 copies/mL. A negative result does not preclude SARS-Cov-2 infection and should not be used as the sole basis for treatment or other patient management decisions. A negative result may occur with  improper specimen collection/handling, submission of specimen other than nasopharyngeal swab, presence of  viral mutation(s) within the areas targeted by this assay, and inadequate number of viral copies(<138 copies/mL). A negative result must be combined with clinical observations, patient history, and epidemiological information. The expected result is Negative.  Fact Sheet for Patients:  EntrepreneurPulse.com.au  Fact Sheet for Healthcare Providers:  IncredibleEmployment.be  This test is no t yet approved or cleared by the [place] FDA and  has  been authorized for detection and/or diagnosis of SARS-CoV-2 by FDA under an Emergency Use Authorization (EUA). This EUA will remain  in effect (meaning this test can be used) for the duration of the COVID-19 declaration under Section 564(b)(1) of the Act, 21 U.S.C.section 360bbb-3(b)(1), unless the authorization is terminated  or revoked sooner.       Influenza A by PCR NEGATIVE NEGATIVE Final   Influenza B by PCR NEGATIVE NEGATIVE Final    Comment: (NOTE) The Xpert Xpress SARS-CoV-2/FLU/RSV plus assay is intended as an aid in the diagnosis of influenza from Nasopharyngeal swab specimens and should not be used as a sole basis for treatment. Nasal washings and aspirates are unacceptable for Xpert Xpress SARS-CoV-2/FLU/RSV testing.  Fact Sheet for Patients: EntrepreneurPulse.com.au  Fact Sheet for Healthcare Providers: IncredibleEmployment.be  This test is not yet approved or cleared by the [place] FDA and has been authorized for detection and/or diagnosis of SARS-CoV-2 by FDA under an Emergency Use Authorization (EUA). This EUA will remain in effect (meaning this test can be used) for the duration of the COVID-19 declaration under Section 564(b)(1) of the Act, 21 U.S.C. section 360bbb-3(b)(1), unless the authorization is terminated or revoked.  Performed at [facility], [place] [address], [place], [place] [address]   Resp Panel by RT-PCR (Flu A&B, Covid) Nasopharyngeal Swab     Status: None   Collection Time: 10/07/21  2:19 PM   Specimen: Nasopharyngeal Swab; Nasopharyngeal(NP) swabs in vial transport medium  Result Value Ref Range Status   SARS Coronavirus 2 by RT PCR NEGATIVE NEGATIVE Final    Comment: (NOTE) SARS-CoV-2 target nucleic acids are NOT DETECTED.  The SARS-CoV-2 RNA is generally detectable in upper respiratory specimens during the acute phase of infection. The lowest concentration of SARS-CoV-2 viral copies this  assay can detect is 138 copies/mL. A negative result does not preclude SARS-Cov-2 infection and should not be used as the sole basis for treatment or other patient management decisions. A negative result may occur with  improper specimen collection/handling, submission of specimen other than nasopharyngeal swab, presence of viral mutation(s) within the areas targeted by this assay, and inadequate number of viral copies(<138 copies/mL). A negative result must be combined with clinical observations, patient history, and epidemiological information. The expected result is Negative.  Fact Sheet for Patients:  EntrepreneurPulse.com.au  Fact Sheet for Healthcare Providers:  IncredibleEmployment.be  This test is no t yet approved or cleared by the [place] FDA and  has been authorized for detection and/or diagnosis of SARS-CoV-2 by FDA under an Emergency Use Authorization (EUA). This EUA will remain  in effect (meaning this test can be used) for the duration of the COVID-19 declaration under Section 564(b)(1) of the Act, 21 U.S.C.section 360bbb-3(b)(1), unless the authorization is terminated  or revoked sooner.       Influenza A by PCR NEGATIVE NEGATIVE Final   Influenza B by PCR NEGATIVE NEGATIVE Final    Comment: (NOTE) The Xpert Xpress SARS-CoV-2/FLU/RSV plus assay is intended as an aid in the diagnosis of influenza from Nasopharyngeal swab specimens and should not be  used as a sole basis for treatment. Nasal washings and aspirates are unacceptable for Xpert Xpress SARS-CoV-2/FLU/RSV testing.  Fact Sheet for Patients: EntrepreneurPulse.com.au  Fact Sheet for Healthcare Providers: IncredibleEmployment.be  This test is not yet approved or cleared by the [place] FDA and has been authorized for detection and/or diagnosis of SARS-CoV-2 by FDA under an Emergency Use Authorization (EUA). This EUA will  remain in effect (meaning this test can be used) for the duration of the COVID-19 declaration under Section 564(b)(1) of the Act, 21 U.S.C. section 360bbb-3(b)(1), unless the authorization is terminated or revoked.  Performed at [facility], [place] [address], [place], [place] [address]     Coagulation Studies: No results for input(s): LABPROT, INR in the last 72 hours.  Urinalysis: No results for input(s): COLORURINE, LABSPEC, PHURINE, GLUCOSEU, HGBUR, BILIRUBINUR, KETONESUR, PROTEINUR, UROBILINOGEN, NITRITE, LEUKOCYTESUR in the last 72 hours.  Invalid input(s): APPERANCEUR    Imaging: No results found.   Medications:    sodium chloride     sodium chloride       amLODipine  10 mg Oral Daily   atropine  1 drop Both Eyes BID   calcium acetate  2,001 mg Oral TID WC   carvedilol  6.25 mg Oral BID WC   Chlorhexidine Gluconate Cloth  6 each Topical Q0600   diclofenac Sodium  2 g Topical QID   dorzolamide-timolol  1 drop Both Eyes BID   furosemide  40 mg Oral Daily   gabapentin  600 mg Oral BID   heparin  5,000 Units Subcutaneous Q8H   hydrALAZINE  100 mg Oral Q8H   insulin aspart  0-5 Units Subcutaneous QHS   insulin aspart  0-6 Units Subcutaneous TID WC   lamoTRIgine  25 mg Oral Daily   pravastatin  80 mg Oral q1800   sodium chloride, sodium chloride, acetaminophen **OR** acetaminophen, albuterol, alteplase, heparin, lidocaine (PF), lidocaine-prilocaine, loperamide, ondansetron **OR** ondansetron (ZOFRAN) IV, oxyCODONE, pentafluoroprop-tetrafluoroeth  Assessment/ Plan:  [facility] dialysis Monday Wednesday Friday.  Outpatient orders 2-1/2 hours F1 80 2K 2.5 calcium.  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   Functioning well after declot 10/09/2021.  Remove temporary dialysis catheter Hyperkalemia resolved with dialysis. Awaiting nursing home placement   LOS: [place] @TODAY @10 :54 AM

## 2021-10-09 NOTE — Progress Notes (Signed)
PT Cancellation Note  Patient Details Name: Tammy Moses MRN: [id number] DOB: 1966-01-21   Cancelled Treatment:    Reason Eval/Treat Not Completed: Patient at procedure or test/unavailable. Pt in HD. Will continue to follow.    Shary Decamp [facility] 10/09/2021, 8:53 AM [place] Pager [phone] Office [phone]

## 2021-10-09 NOTE — Discharge Summary (Signed)
Physician Discharge Summary  Tammy Moses ERD:[id number] DOB: [birth year] DOA: 09/30/2021  PCP: [name], FNP  Admit date: 09/30/2021 Discharge date: 10/09/2021  Admitted From: Home Disposition:  SnF  Discharge Condition:Stable CODE STATUS:FULL Diet recommendation: Renal  Brief/Interim Summary:  55 year old African-American female with a history of end-stage renal disease needing dialysis on Monday Wednesday Friday with a history of noncompliance, chronic leukocytosis, type 2 diabetes, hypertension, anemia chronic disease, COPD, chronic tobacco abuse presented to the ER  via EMS with hypoxia and respiratory distress, elevated BP.  She was admitted for the management of acute hypoxic respiratory failure secondary to volume overload from missed dialysis causing flash pulmonary edema and malignant hypertension: All from noncompliance.  She was  started on dialysis here.  Nephrology following.  PT/OT recommended skilled nursing facility on discharge.  She can be discharged today after her dialysis session through her AV fistula and removal of temporary dialysis catheter.  Following  problems were addressed during her hospitalization:  Acute Hypoxic Resp Failure: due to fluid overload from missed dialysis causing flash pulmonary edema, malignant hypertension - all due to noncompliance with HD regimen and blood pressure medications, was urgently dialyzed 09/30/2021 and now on schedule.  Symptoms have resolved blood pressure medications have been adjusted and blood pressure stable.Now on room air.   ESRD:On MWF schedule nephrology on board.  Underwent left upper extremity AV fistulogram/declotting of dialysis access with IR on 12/14. Hyperkalemia treated with Lokelma.She will be on TTS schedule after dc.  Can be dialyzed with the AV fistula.  Temporary Hemodialysis catheter to be taken out   Poorly controlled hypertension: Noncompliant.  Counseled, medications adjusted further on 10/01/2021 for  better control.    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      COPD:  At baseline.   Acute on chronic diastolic CHF : EF of 56% on recent echo.      Normocytic anemia: Secondary to ESRD.  Currently hemoglobin is stable   Hyponatremia: Most likely secondary to volume overload.  Continue to monitor.Stable   DM type II:  On sliding scale insulin here,A1c of 5.9.  Currently not on any medications.   Debility/deconditioning: PT/OT recommended skilled nursing facility on discharge.  She is also legally blind.      Discharge Diagnoses:  Principal Problem:   Malignant hypertension Active Problems:   BLINDNESS, RIGHT EYE   Type 2 DM with hypertension and ESRD on dialysis ([place])   COPD (chronic obstructive pulmonary disease) (HCC)   ESRD (end stage renal disease) ([place]) -  Dialyzes at [facility].  On Monday Wednesday Friday.   Flash pulmonary edema (HCC)   Non compliance with medical treatment    Discharge Instructions  Discharge Instructions     Diet - low sodium heart healthy   Complete by: As directed    Discharge instructions   Complete by: As directed    1)Please take the medications as instructed 2)Do CBC and BMP tests in a week   Increase activity slowly   Complete by: As directed    No wound care   Complete by: As directed       Allergies as of 10/09/2021       Reactions   Acyclovir And Related Itching        Medication List     TAKE these medications    acetaminophen 500 MG tablet Commonly known as: TYLENOL Take 1,000 mg by mouth every 6 (six) hours as  needed for mild pain.   albuterol 108 (90 Base) MCG/ACT inhaler Commonly known as: VENTOLIN HFA Inhale 2 puffs into the lungs every 4 (four) hours as needed for shortness of breath. For shortness of breath   amLODipine 10 MG tablet Commonly known as: NORVASC TAKE 1 TABLET(10 MG) BY MOUTH DAILY What changed: See the new instructions.   atropine 1 %  ophthalmic solution Place 1 drop into both eyes 2 (two) times daily.   blood glucose meter kit and supplies Kit Dispense based on patient and insurance preference. Use up to four times daily as directed. (FOR ICD-10 E.11.9)   buPROPion 150 MG 12 hr tablet Commonly known as: ZYBAN Take 150 mg by mouth daily.   calcium acetate 667 MG capsule Commonly known as: PHOSLO Take 2,001 mg by mouth 3 (three) times daily with meals.   diltiazem 2 % Gel Apply 1 application topically 5 (five) times daily.   dorzolamide-timolol 22.3-6.8 MG/ML ophthalmic solution Commonly known as: COSOPT Place 1 drop into both eyes 2 (two) times daily.   furosemide 40 MG tablet Commonly known as: LASIX Take 1 tablet (40 mg total) by mouth daily.   gabapentin 600 MG tablet Commonly known as: Neurontin Take 1 tablet (600 mg total) by mouth 2 (two) times daily.   glucose blood test strip Commonly known as: ONE TOUCH ULTRA TEST USE TO TEST 3 TIMES A DAY   hydrALAZINE 100 MG tablet Commonly known as: APRESOLINE Take 1 tablet (100 mg total) by mouth every 8 (eight) hours. What changed: when to take this   Insulin Pen Needle 29G X 12MM Misc E11.9 Dx Code Use once at bedtime with each nightly insulin   lamoTRIgine 25 MG tablet Commonly known as: LAMICTAL Take 25 mg by mouth daily.   latanoprost 0.005 % ophthalmic solution Commonly known as: XALATAN Place 1 drop into both eyes at bedtime.   metoCLOPramide 10 MG tablet Commonly known as: REGLAN Take 10 mg by mouth 2 (two) times daily as needed for nausea.   onetouch ultrasoft lancets Use as instructed   pravastatin 80 MG tablet Commonly known as: PRAVACHOL Take 1 tablet (80 mg total) by mouth daily.        Allergies  Allergen Reactions   Acyclovir And Related Itching    Consultations: Nephrology, IR   Procedures/Studies: DG Chest 2 View  Result Date: 09/20/2021 CLINICAL DATA:  Fever, diabetes, hypertension EXAM: CHEST - 2 VIEW  COMPARISON:  09/16/2021 FINDINGS: Enlargement of cardiac silhouette with pulmonary vascular congestion. Atherosclerotic calcification aorta. Lungs clear. No pulmonary infiltrate, pleural effusion, or pneumothorax. Osseous demineralization. IMPRESSION: Enlargement of cardiac silhouette. No acute abnormalities. Aortic Atherosclerosis (ICD10-I70.0). Electronically Signed   By: Lavonia Dana M.D.   On: 09/20/2021 15:38   CT Angio Chest PE W and/or Wo Contrast  Result Date: 10/01/2021 CLINICAL DATA:  Shortness of breath and chest pain EXAM: CT ANGIOGRAPHY CHEST WITH CONTRAST TECHNIQUE: Multidetector CT imaging of the chest was performed using the standard protocol during bolus administration of intravenous contrast. Multiplanar CT image reconstructions and MIPs were obtained to evaluate the vascular anatomy. CONTRAST:  86mL OMNIPAQUE IOHEXOL 350 MG/ML SOLN COMPARISON:  09/30/2021, CT from 05/15/2021 FINDINGS: Cardiovascular: Thoracic aorta and its branches demonstrate atherosclerotic calcifications. No aneurysmal dilatation or dissection is noted. Mild cardiac enlargement is seen. Scattered coronary calcifications are noted. Pulmonary artery is well visualized within normal branching pattern. No filling defect to suggest pulmonary embolism is noted. Mediastinum/Nodes: Thoracic inlet is within normal limits. No sizable  hilar or mediastinal adenopathy is noted. The esophagus as visualized is within normal limits. Lungs/Pleura: Lungs are well aerated bilaterally with the exception of bilateral lower lobe atelectatic changes. Mild changes of vascular congestion and central parenchymal edema are seen. No pneumothorax is noted. Small effusions are seen bilaterally. Upper Abdomen: Visualized upper abdomen demonstrates a thrombosed calcified splenic artery aneurysm. No other focal abnormality is seen. Musculoskeletal: Undisplaced right sixth rib fracture is noted anteriorly. Degenerative changes of the thoracic spine are  noted. Review of the MIP images confirms the above findings. IMPRESSION: No evidence of pulmonary emboli. Bilateral lower lobe atelectasis. Changes of mild CHF. Displaced right sixth rib fracture without complicating factors. 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 Aortic Atherosclerosis (ICD10-I70.0). Electronically Signed   By: Inez Catalina M.D.   On: 10/01/2021 02:59   IR Fluoro Guide CV Line Right  Result Date: 10/06/2021 INDICATION: 55 year old female with history of end-stage renal disease with malfunctioning upper extremity fistula. The patient elected to forego NPO status and have temporary hemodialysis catheter placed to delay fistulagram. EXAM: NON-TUNNELED CENTRAL VENOUS HEMODIALYSIS CATHETER PLACEMENT WITH ULTRASOUND AND FLUOROSCOPIC GUIDANCE COMPARISON:  None. MEDICATIONS: None FLUOROSCOPY TIME:  0 minutes, 24 seconds (2 mGy) COMPLICATIONS: None immediate. PROCEDURE: Informed written consent was obtained from the patient after a discussion of the risks, benefits, and alternatives to treatment. Questions regarding the procedure were encouraged and answered. The right neck and chest were prepped with chlorhexidine in a sterile fashion, and a sterile drape was applied covering the operative field. Maximum barrier sterile technique with sterile gowns and gloves were used for the procedure. A timeout was performed prior to the initiation of the procedure. After the overlying soft tissues were anesthetized, a small venotomy incision was created and a micropuncture kit was utilized to access the internal jugular vein. Real-time ultrasound guidance was utilized for vascular access including the acquisition of a permanent ultrasound image documenting patency of the accessed vessel. The microwire was utilized to measure appropriate catheter length. A Rosen wire was advanced to the level of the IVC. Under fluoroscopic guidance, the venotomy was serially dilated, ultimately  allowing placement of a 15 cm temporary Trialysis catheter with tip ultimately terminating within the superior aspect of the right atrium. Final catheter positioning was confirmed and documented with a spot radiographic image. The catheter aspirates and flushes normally. The catheter was flushed with appropriate volume heparin dwells. The catheter exit site was secured with a 0 silk retention suture. A dressing was placed. The patient tolerated the procedure well without immediate post procedural complication. IMPRESSION: Successful placement of a right internal jugular approach 15 cm temporary dialysis catheter with tip terminating with in the superior aspect of the right atrium. The catheter is ready for immediate use. PLAN: This catheter may be converted to a tunneled dialysis catheter at a later date as indicated. Plan for fistulagram as originally requested this admission. Ruthann Cancer, MD [facility] Radiology Electronically Signed   By: Ruthann Cancer M.D.   On: 10/06/2021 15:33   IR US Guide Vasc Access Right  Result Date: 10/06/2021 INDICATION: 55 year old female with history of end-stage renal disease with malfunctioning upper extremity fistula. The patient elected to forego NPO status and have temporary hemodialysis catheter placed to delay fistulagram. EXAM: NON-TUNNELED CENTRAL VENOUS HEMODIALYSIS CATHETER PLACEMENT WITH ULTRASOUND AND FLUOROSCOPIC GUIDANCE COMPARISON:  None. MEDICATIONS: None FLUOROSCOPY TIME:  0 minutes, 24 seconds (2 mGy) COMPLICATIONS: None immediate. PROCEDURE: Informed written consent was  obtained from the patient after a discussion of the risks, benefits, and alternatives to treatment. Questions regarding the procedure were encouraged and answered. The right neck and chest were prepped with chlorhexidine in a sterile fashion, and a sterile drape was applied covering the operative field. Maximum barrier sterile technique with  sterile gowns and gloves were used for the procedure. A timeout was performed prior to the initiation of the procedure. After the overlying soft tissues were anesthetized, a small venotomy incision was created and a micropuncture kit was utilized to access the internal jugular vein. Real-time ultrasound guidance was utilized for vascular access including the acquisition of a permanent ultrasound image documenting patency of the accessed vessel. The microwire was utilized to measure appropriate catheter length. A Rosen wire was advanced to the level of the IVC. Under fluoroscopic guidance, the venotomy was serially dilated, ultimately allowing placement of a 15 cm temporary Trialysis catheter with tip ultimately terminating within the superior aspect of the right atrium. Final catheter positioning was confirmed and documented with a spot radiographic image. The catheter aspirates and flushes normally. The catheter was flushed with appropriate volume heparin dwells. The catheter exit site was secured with a 0 silk retention suture. A dressing was placed. The patient tolerated the procedure well without immediate post procedural complication. IMPRESSION: Successful placement of a right internal jugular approach 15 cm temporary dialysis catheter with tip terminating with in the superior aspect of the right atrium. The catheter is ready for immediate use. PLAN: This catheter may be converted to a tunneled dialysis catheter at a later date as indicated. Plan for fistulagram as originally requested this admission. Ruthann Cancer, MD [facility] Radiology Electronically Signed   By: Ruthann Cancer M.D.   On: 10/06/2021 15:33   DG Chest Port 1 View  Result Date: 10/01/2021 CLINICAL DATA:  Shortness of breath EXAM: PORTABLE CHEST 1 VIEW COMPARISON:  Chest radiograph 1 day prior FINDINGS: The heart is enlarged, unchanged. The mediastinal contours are stable. Aeration of the  right base has improved. There is no overt pulmonary edema. There is no new or worsening focal airspace disease. The small pleural effusion seen on the prior CT are not well seen on the current study. There is no enlarging pleural effusion. There is no pneumothorax. IMPRESSION: Improved aeration of the right base compared to the study from 1 day prior. No new or worsening airspace disease. Electronically Signed   By: Valetta Mole M.D.   On: 10/01/2021 08:02   DG Chest Port 1 View  Result Date: 09/30/2021 CLINICAL DATA:  Shortness of breath.  COPD. EXAM: PORTABLE CHEST 1 VIEW COMPARISON:  09/20/2021 FINDINGS: Midline trachea. Cardiomegaly accentuated by AP portable technique. Atherosclerosis in the transverse aorta. No pneumothorax. Suspect trace right pleural fluid. Interstitial edema, as evidenced by septal thickening and interstitial indistinctness. Right greater than left base airspace disease. IMPRESSION: Cardiomegaly with mild interstitial edema. Bibasilar airspace disease. Although this could represent atelectasis, especially in the medial right lung base, concurrent infection is a concern. Possible trace right pleural fluid. Aortic Atherosclerosis (ICD10-I70.0). Electronically Signed   By: Abigail Miyamoto M.D.   On: 09/30/2021 11:14   DG Chest Port 1 View  Result Date: 09/16/2021 CLINICAL DATA:  Sepsis. EXAM: PORTABLE CHEST 1 VIEW COMPARISON:  December 10, 2020. FINDINGS: The heart size and mediastinal contours are within normal limits. Both lungs are clear. The visualized skeletal structures are unremarkable. IMPRESSION: No active disease. Aortic Atherosclerosis (ICD10-I70.0). Electronically Signed  By: Marijo Conception M.D.   On: 09/16/2021 13:24   DG Foot Complete Right  Result Date: 09/22/2021 CLINICAL DATA:  Injury with pain EXAM: RIGHT FOOT COMPLETE - 3+ VIEW COMPARISON:  None. FINDINGS: No evidence of regional fracture. Mild hallux valgus deformity. Arterial calcification throughout the foot  and ankle is visible. IMPRESSION: No acute or traumatic finding. Mild hallux valgus deformity. Arterial calcification. Electronically Signed   By: Nelson Chimes M.D.   On: 09/22/2021 18:53   IR AV DIALY SHUNT INTRO NEEDLE/INTRACATH INITIAL W/PTA/IMG RIGHT  Result Date: 10/07/2021 INDICATION: END-STAGE RENAL DISEASE, LEFT UPPER ARM CHRONIC AV FISTULA, PREVIOUSLY STENTED PERIPHERALLY. DECREASED ACCESS FLOW RATES. EXAM: ULTRASOUND GUIDANCE FOR VASCULAR ACCESS LEFT UPPER ARM AV FISTULOGRAM PERIPHERAL 7 MM VENOUS ANGIOPLASTY (OF A PREVIOUSLY STENTED SEGMENT) MEDICATIONS: 1% LIDOCAINE LOCAL ANESTHESIA/SEDATION: Moderate (conscious) sedation was employed during this procedure. A total of Versed 0.5 mg and Fentanyl 25 mcg was administered intravenously by the radiology nurse. Total intra-service moderate Sedation Time: 15 minutes. The patient's level of consciousness and vital signs were monitored continuously by radiology nursing throughout the procedure under my direct supervision. FLUOROSCOPY TIME:  Fluoroscopy Time: 1 minutes 30 seconds (18 mGy). COMPLICATIONS: None immediate. PROCEDURE: Informed written consent was obtained from the patient after a thorough discussion of the procedural risks, benefits and alternatives. All questions were addressed. Maximal Sterile Barrier Technique was utilized including caps, mask, sterile gowns, sterile gloves, sterile drape, hand hygiene and skin antiseptic. A timeout was performed prior to the initiation of the procedure. Previous imaging reviewed. Ultrasound micropuncture access performed of the patent left upper arm fistula close to the arterial anastomosis at the elbow. Images obtained for documentation of the patent fistula. The guidewire advanced followed by the micro dilator set. Initial outflow venogram performed. Left upper arm fistulagram: Fistula is patent. 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 In the upper humerus level, there is a peripheral stent with  intimal hyperplasia at both ends resulting in tandem stenoses estimated at 50-70%. This results in flow limitation centrally. Central venogram confirms patency of the central veins. However, there is sluggish venous flow centrally because of in stent stenoses peripherally. Peripheral 7 mm angioplasty: 6 French sheath inserted. Guidewire access confirmed across the stented segment. Overlapping 7 mm angioplasty performed with a 7 x 4 mustang balloon. Balloon was inflated to 20 atmospheres for 1 minute inter overlapping fashion. Two inflations performed. Following angioplasty, repeat shuntogram demonstrates improved patency of the stent with only minor residual narrowing, less than 30%. There is improved venous outflow centrally following angioplasty. Central veins were re-examine and confirmed to be widely patent. Access removed. Hemostasis obtained with pursestring suture. No immediate complication. Patient tolerated the procedure well. IMPRESSION: Fistulagram confirms peripheral in stent stenoses at both ends of the stent estimated at 50-70%. This responded to 7 mm overlapping angioplasty as above with very minimal residual narrowing. Following angioplasty there is improved venous outflow centrally. ACCESS: This access remains amenable to future percutaneous interventions as clinically indicated. Electronically Signed   By: Jerilynn Mages.  Shick M.D.   On: 10/07/2021 10:59   ECHOCARDIOGRAM COMPLETE  Result Date: 09/18/2021    ECHOCARDIOGRAM REPORT   Patient Name:   Tammy Moses Date of Exam: 09/18/2021 Medical Rec #:  [id number]      Height:       61.0 in Accession #:    [id number]     Weight:       122.4 lb Date of Birth:  [birth year]  BSA:          1.533 m Patient Age:    44 years       BP:           160/66 mmHg Patient Gender: F              HR:           81 bpm. Exam Location:  Inpatient Procedure: 2D Echo, Cardiac Doppler and Color Doppler Indications:    CHF-Acute Systolic  History:        Patient has no prior  history of Echocardiogram examinations.                 COPD; Risk Factors:Hypertension and Diabetes.  Sonographer:    Wenda Low Referring Phys: [id number] [place]  1. Left ventricular ejection fraction, by estimation, is 60 to 65%. The left ventricle has normal function. The left ventricle has no regional wall motion abnormalities. There is moderate left ventricular hypertrophy. Left ventricular diastolic parameters are indeterminate.  2. Right ventricular systolic function is normal. The right ventricular size is normal. There is mildly elevated pulmonary artery systolic pressure.  3. The mitral valve is degenerative. Trivial mitral valve regurgitation. No evidence of mitral stenosis. Moderate mitral annular calcification.  4. The aortic valve is tricuspid. There is moderate calcification of the aortic valve. There is moderate thickening of the aortic valve. Aortic valve regurgitation is not visualized. Mild aortic valve stenosis.  5. The inferior vena cava is normal in size with greater than 50% respiratory variability, suggesting right atrial pressure of 3 mmHg. FINDINGS  Left Ventricle: Left ventricular ejection fraction, by estimation, is 60 to 65%. The left ventricle has normal function. The left ventricle has no regional wall motion abnormalities. The left ventricular internal cavity size was normal in size. There is  moderate left ventricular hypertrophy. Left ventricular diastolic parameters are indeterminate. Right Ventricle: The right ventricular size is normal. No increase in right ventricular wall thickness. Right ventricular systolic function is normal. There is mildly elevated pulmonary artery systolic pressure. The tricuspid regurgitant velocity is 2.96  m/s, and with an assumed right atrial pressure of 8 mmHg, the estimated right ventricular systolic pressure is 64.4 mmHg. Left Atrium: Left atrial size was normal in size. Right Atrium: Right atrial size was normal in size.  Pericardium: There is no evidence of pericardial effusion. Mitral Valve: The mitral valve is degenerative in appearance. There is mild thickening of the mitral valve leaflet(s). There is mild calcification of the mitral valve leaflet(s). Moderate mitral annular calcification. Trivial mitral valve regurgitation. No evidence of mitral valve stenosis. MV peak gradient, 10.8 mmHg. The mean mitral valve gradient is 4.0 mmHg. Tricuspid Valve: The tricuspid valve is normal in structure. Tricuspid valve regurgitation is mild . No evidence of tricuspid stenosis. Aortic Valve: The aortic valve is tricuspid. There is moderate calcification of the aortic valve. There is moderate thickening of the aortic valve. Aortic valve regurgitation is not visualized. Mild aortic stenosis is present. Aortic valve mean gradient measures 10.5 mmHg. Aortic valve peak gradient measures 20.2 mmHg. Aortic valve area, by VTI measures 1.65 cm. Pulmonic Valve: The pulmonic valve was normal in structure. Pulmonic valve regurgitation is not visualized. No evidence of pulmonic stenosis. Aorta: The aortic root is normal in size and structure. Venous: The inferior vena cava is normal in size with greater than 50% respiratory variability, suggesting right atrial pressure of 3 mmHg. IAS/Shunts: No atrial level shunt detected by color flow  Doppler.  LEFT VENTRICLE PLAX 2D LVIDd:         4.80 cm   Diastology LVIDs:         2.90 cm   LV e' medial:    4.68 cm/s LV PW:         1.30 cm   LV E/e' medial:  25.6 LV IVS:        1.50 cm   LV e' lateral:   6.42 cm/s LVOT diam:     1.90 cm   LV E/e' lateral: 18.7 LV SV:         63 LV SV Index:   41 LVOT Area:     2.84 cm  RIGHT VENTRICLE RV Basal diam:  3.15 cm RV Mid diam:    2.60 cm RV S prime:     11.70 cm/s TAPSE (M-mode): 2.0 cm LEFT ATRIUM           Index        RIGHT ATRIUM           Index LA diam:      3.60 cm 2.35 cm/m   RA Area:     15.60 cm LA Vol (A2C): 37.8 ml 24.66 ml/m  RA Volume:   40.00 ml  26.10  ml/m LA Vol (A4C): 54.2 ml 35.36 ml/m  AORTIC VALVE                     PULMONIC VALVE AV Area (Vmax):    1.65 cm      PV Vmax:       0.98 m/s AV Area (Vmean):   1.67 cm      PV Peak grad:  3.9 mmHg AV Area (VTI):     1.65 cm AV Vmax:           224.50 cm/s AV Vmean:          151.000 cm/s AV VTI:            0.380 m AV Peak Grad:      20.2 mmHg AV Mean Grad:      10.5 mmHg LVOT Vmax:         131.00 cm/s LVOT Vmean:        89.000 cm/s LVOT VTI:          0.221 m LVOT/AV VTI ratio: 0.58  AORTA Ao Root diam: 2.80 cm MITRAL VALVE                TRICUSPID VALVE MV Area (PHT): 2.95 cm     TR Peak grad:   35.0 mmHg MV Area VTI:   1.55 cm     TR Vmax:        296.00 cm/s MV Peak grad:  10.8 mmHg MV Mean grad:  4.0 mmHg     SHUNTS MV Vmax:       1.64 m/s     Systemic VTI:  0.22 m MV Vmean:      96.0 cm/s    Systemic Diam: 1.90 cm MV Decel Time: 257 msec MV E velocity: 120.00 cm/s MV A velocity: 134.00 cm/s MV E/A ratio:  0.90 Jenkins Rouge MD Electronically signed by Jenkins Rouge MD Signature Date/Time: 09/18/2021/3:06:24 PM    Final    US Abdomen Limited RUQ (LIVER/GB)  Result Date: 09/21/2021 CLINICAL DATA:  Fever, nausea and vomiting EXAM: ULTRASOUND ABDOMEN LIMITED RIGHT UPPER QUADRANT COMPARISON:  None. FINDINGS: Gallbladder: No gallstones or wall thickening visualized. No sonographic Murphy sign noted  by sonographer. Common bile duct: Diameter: 4.5 mm, normal Liver: No focal lesion identified. Within normal limits in parenchymal echogenicity. Portal vein is patent on color Doppler imaging with normal direction of blood flow towards the liver. Other: None. IMPRESSION: Unremarkable right upper quadrant ultrasound. Electronically Signed   By: Macy Mis M.D.   On: 09/21/2021 08:36      Subjective: Patient seen and examined at the dialysis suite this morning.  Hemodynamically stable for discharge today.  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     Microbiology: Recent Results (from the past 240 hour(s))  Resp Panel by RT-PCR (Flu A&B, Covid) Nasopharyngeal Swab     Status: None   Collection Time: 09/30/21 11:38 AM   Specimen: Nasopharyngeal Swab; Nasopharyngeal(NP) swabs in vial transport medium  Result Value Ref Range Status   SARS Coronavirus 2 by RT PCR NEGATIVE NEGATIVE Final    Comment: (NOTE) SARS-CoV-2 target nucleic acids are NOT DETECTED.  The SARS-CoV-2 RNA is generally detectable in upper respiratory specimens during the acute phase of infection. The lowest concentration of SARS-CoV-2 viral copies this assay can detect is 138 copies/mL. A negative result does not preclude SARS-Cov-2 infection and should not be used as the sole basis for treatment or other patient management decisions. A negative result may occur with  improper specimen collection/handling, submission of specimen other than nasopharyngeal swab, presence of viral mutation(s) within the areas targeted by this assay, and inadequate number of viral copies(<138 copies/mL). A negative result must be combined with clinical observations, patient history, and epidemiological information. The expected result is Negative.  Fact Sheet for Patients:   EntrepreneurPulse.com.au  Fact Sheet for Healthcare Providers:  IncredibleEmployment.be  This test is no t yet approved or cleared by the [place] FDA and  has been authorized for detection and/or diagnosis of SARS-CoV-2 by FDA under an Emergency Use Authorization (EUA). This EUA will remain  in effect (meaning this test can be used) for the duration of the COVID-19 declaration under Section 564(b)(1) of the Act, 21 U.S.C.section 360bbb-3(b)(1), unless the authorization is terminated  or revoked sooner.       Influenza A by PCR NEGATIVE NEGATIVE Final   Influenza B by PCR NEGATIVE NEGATIVE Final    Comment: (NOTE) The Xpert Xpress SARS-CoV-2/FLU/RSV plus assay is intended as an aid in the diagnosis of influenza from Nasopharyngeal swab specimens and should not be used as a sole basis for treatment. Nasal washings and aspirates are unacceptable for Xpert Xpress SARS-CoV-2/FLU/RSV testing.  Fact Sheet for Patients: EntrepreneurPulse.com.au  Fact Sheet for Healthcare Providers: IncredibleEmployment.be  This  test is not yet approved or cleared by the [place] and has been authorized for detection and/or diagnosis of SARS-CoV-2 by FDA under an Emergency Use Authorization (EUA). This EUA will remain in effect (meaning this test can be used) for the duration of the COVID-19 declaration under Section 564(b)(1) of the Act, 21 U.S.C. section 360bbb-3(b)(1), unless the authorization is terminated or revoked.  Performed at [facility], [place] [address] [place], [place] [address]   Resp Panel by RT-PCR (Flu A&B, Covid) Nasopharyngeal Swab     Status: None   Collection Time: 10/07/21  2:19 PM   Specimen: Nasopharyngeal Swab; Nasopharyngeal(NP) swabs in vial transport medium  Result Value Ref Range Status   SARS Coronavirus 2 by RT PCR NEGATIVE NEGATIVE Final    Comment: (NOTE) SARS-CoV-2  target nucleic acids are NOT DETECTED.  The SARS-CoV-2 RNA is generally detectable in upper respiratory specimens during the acute phase of infection. The lowest concentration of SARS-CoV-2 viral copies this assay can detect is 138 copies/mL. A negative result does not preclude SARS-Cov-2 infection and should not be used as the sole basis for treatment or other patient management decisions. A negative result may occur with  improper specimen collection/handling, submission of specimen other than nasopharyngeal swab, presence of viral mutation(s) within the areas targeted by this assay, and inadequate number of viral copies(<138 copies/mL). A negative result must be combined with clinical observations, patient history, and epidemiological information. The expected result is Negative.  Fact Sheet for Patients:  EntrepreneurPulse.com.au  Fact Sheet for Healthcare Providers:  IncredibleEmployment.be  This test is no t yet approved or cleared by the [place] FDA and  has been authorized for detection and/or diagnosis of SARS-CoV-2 by FDA under an Emergency Use Authorization (EUA). This EUA will remain  in effect (meaning this test can be used) for the duration of the COVID-19 declaration under Section 564(b)(1) of the Act, 21 U.S.C.section 360bbb-3(b)(1), unless the authorization is terminated  or revoked sooner.       Influenza A by PCR NEGATIVE NEGATIVE Final   Influenza B by PCR NEGATIVE NEGATIVE Final    Comment: (NOTE) The Xpert Xpress SARS-CoV-2/FLU/RSV plus assay is intended as an aid in the diagnosis of influenza from Nasopharyngeal swab specimens and should not be used as a sole basis for treatment. Nasal washings and aspirates are unacceptable for Xpert Xpress SARS-CoV-2/FLU/RSV testing.  Fact Sheet for Patients: EntrepreneurPulse.com.au  Fact Sheet for Healthcare  Providers: IncredibleEmployment.be  This test is not yet approved or cleared by the [place] FDA and has been authorized for detection and/or diagnosis of SARS-CoV-2 by FDA under an Emergency Use Authorization (EUA). This EUA will remain in effect (meaning this test can be used) for the duration of the COVID-19 declaration under Section 564(b)(1) of the Act, 21 U.S.C. section 360bbb-3(b)(1), unless the authorization is terminated or revoked.  Performed at [facility], [place] [address] [place], [place] [address]      Labs: BNP (last 3 results) Recent Labs    09/16/21 1405 09/30/21 1055 09/30/21 2118  BNP 3,461.4* 3,585.7* 8,786.7*   Basic Metabolic Panel: Recent Labs  Lab 10/05/21 0824 10/06/21 0303 10/08/21 0203 10/09/21 0805  NA 126* 125* 132* 129*  K 5.0 5.5* 3.8 4.9  CL 93* 91* 92* 95*  CO2 19* 18* 25 23  GLUCOSE 224* 94 129* 129*  BUN 76* 92* 45* 62*  CREATININE 10.46* 12.45* 7.57* 9.22*  CALCIUM 8.9 9.2 8.9 9.0  PHOS  5.1*  --   --  5.2*   Liver Function Tests: Recent Labs  Lab 10/05/21 0824 10/09/21 0805  ALBUMIN 2.7* 2.7*   No results for input(s): LIPASE, AMYLASE in the last 168 hours. No results for input(s): AMMONIA in the last 168 hours. CBC: Recent Labs  Lab 10/05/21 0824 10/06/21 0303 10/09/21 0806  WBC 9.8 10.7* 10.5  HGB 9.3* 9.0* 7.8*  HCT 29.5* 28.6* 25.2*  MCV 89.9 89.9 93.0  PLT 282 278 291   Cardiac Enzymes: No results for input(s): CKTOTAL, CKMB, CKMBINDEX, TROPONINI in the last 168 hours. BNP: Invalid input(s): POCBNP CBG: Recent Labs  Lab 10/07/21 2122 10/08/21 1234 10/08/21 1613 10/08/21 2155 10/09/21 0711  GLUCAP 159* 111* 143* 160* 111*   D-Dimer No results for input(s): DDIMER in the last 72 hours. Hgb A1c No results for input(s): HGBA1C in the last 72 hours. Lipid Profile No results for input(s): CHOL, HDL, LDLCALC, TRIG, CHOLHDL, LDLDIRECT in the last 72 hours. Thyroid function  studies No results for input(s): TSH, T4TOTAL, T3FREE, THYROIDAB in the last 72 hours.  Invalid input(s): FREET3 Anemia work up No results for input(s): VITAMINB12, FOLATE, FERRITIN, TIBC, IRON, RETICCTPCT in the last 72 hours. Urinalysis    Component Value Date/Time   COLORURINE YELLOW 09/16/2021 1511   APPEARANCEUR HAZY (A) 09/16/2021 1511   LABSPEC 1.013 09/16/2021 1511   PHURINE 5.0 09/16/2021 1511   GLUCOSEU >=500 (A) 09/16/2021 1511   HGBUR SMALL (A) 09/16/2021 1511   BILIRUBINUR NEGATIVE 09/16/2021 1511   BILIRUBINUR Negative 12/11/2019 0809   KETONESUR 5 (A) 09/16/2021 1511   PROTEINUR 100 (A) 09/16/2021 1511   UROBILINOGEN 0.2 12/11/2019 0809   UROBILINOGEN 0.2 12/13/2017 1353   NITRITE NEGATIVE 09/16/2021 1511   LEUKOCYTESUR NEGATIVE 09/16/2021 1511   Sepsis Labs Invalid input(s): PROCALCITONIN,  WBC,  LACTICIDVEN Microbiology Recent Results (from the past 240 hour(s))  Resp Panel by RT-PCR (Flu A&B, Covid) Nasopharyngeal Swab     Status: None   Collection Time: 09/30/21 11:38 AM   Specimen: Nasopharyngeal Swab; Nasopharyngeal(NP) swabs in vial transport medium  Result Value Ref Range Status   SARS Coronavirus 2 by RT PCR NEGATIVE NEGATIVE Final    Comment: (NOTE) SARS-CoV-2 target nucleic acids are NOT DETECTED.  The SARS-CoV-2 RNA is generally detectable in upper respiratory specimens during the acute phase of infection. The lowest concentration of SARS-CoV-2 viral copies this assay can detect is 138 copies/mL. A negative result does not preclude SARS-Cov-2 infection and should not be used as the sole basis for treatment or other patient management decisions. A negative result may occur with  improper specimen collection/handling, submission of specimen other than nasopharyngeal swab, presence of viral mutation(s) within the areas targeted by this assay, and inadequate number of viral copies(<138 copies/mL). A negative result must be combined with clinical  observations, patient history, and epidemiological information. The expected result is Negative.  Fact Sheet for Patients:  EntrepreneurPulse.com.au  Fact Sheet for Healthcare Providers:  IncredibleEmployment.be  This test is no t yet approved or cleared by the [place] FDA and  has been authorized for detection and/or diagnosis of SARS-CoV-2 by FDA under an Emergency Use Authorization (EUA). This EUA will remain  in effect (meaning this test can be used) for the duration of the COVID-19 declaration under Section 564(b)(1) of the Act, 21 U.S.C.section 360bbb-3(b)(1), unless the authorization is terminated  or revoked sooner.       Influenza A by PCR NEGATIVE NEGATIVE Final   Influenza B  by PCR NEGATIVE NEGATIVE Final    Comment: (NOTE) The Xpert Xpress SARS-CoV-2/FLU/RSV plus assay is intended as an aid in the diagnosis of influenza from Nasopharyngeal swab specimens and should not be used as a sole basis for treatment. Nasal washings and aspirates are unacceptable for Xpert Xpress SARS-CoV-2/FLU/RSV testing.  Fact Sheet for Patients: EntrepreneurPulse.com.au  Fact Sheet for Healthcare Providers: IncredibleEmployment.be  This test is not yet approved or cleared by the [place] FDA and has been authorized for detection and/or diagnosis of SARS-CoV-2 by FDA under an Emergency Use Authorization (EUA). This EUA will remain in effect (meaning this test can be used) for the duration of the COVID-19 declaration under Section 564(b)(1) of the Act, 21 U.S.C. section 360bbb-3(b)(1), unless the authorization is terminated or revoked.  Performed at [facility], [place] [address] [place], [place] [address]   Resp Panel by RT-PCR (Flu A&B, Covid) Nasopharyngeal Swab     Status: None   Collection Time: 10/07/21  2:19 PM   Specimen: Nasopharyngeal Swab; Nasopharyngeal(NP) swabs in vial transport medium   Result Value Ref Range Status   SARS Coronavirus 2 by RT PCR NEGATIVE NEGATIVE Final    Comment: (NOTE) SARS-CoV-2 target nucleic acids are NOT DETECTED.  The SARS-CoV-2 RNA is generally detectable in upper respiratory specimens during the acute phase of infection. The lowest concentration of SARS-CoV-2 viral copies this assay can detect is 138 copies/mL. A negative result does not preclude SARS-Cov-2 infection and should not be used as the sole basis for treatment or other patient management decisions. A negative result may occur with  improper specimen collection/handling, submission of specimen other than nasopharyngeal swab, presence of viral mutation(s) within the areas targeted by this assay, and inadequate number of viral copies(<138 copies/mL). A negative result must be combined with clinical observations, patient history, and epidemiological information. The expected result is Negative.  Fact Sheet for Patients:  EntrepreneurPulse.com.au  Fact Sheet for Healthcare Providers:  IncredibleEmployment.be  This test is no t yet approved or cleared by the [place] FDA and  has been authorized for detection and/or diagnosis of SARS-CoV-2 by FDA under an Emergency Use Authorization (EUA). This EUA will remain  in effect (meaning this test can be used) for the duration of the COVID-19 declaration under Section 564(b)(1) of the Act, 21 U.S.C.section 360bbb-3(b)(1), unless the authorization is terminated  or revoked sooner.       Influenza A by PCR NEGATIVE NEGATIVE Final   Influenza B by PCR NEGATIVE NEGATIVE Final    Comment: (NOTE) The Xpert Xpress SARS-CoV-2/FLU/RSV plus assay is intended as an aid in the diagnosis of influenza from Nasopharyngeal swab specimens and should not be used as a sole basis for treatment. Nasal washings and aspirates are unacceptable for Xpert Xpress SARS-CoV-2/FLU/RSV testing.  Fact Sheet for  Patients: EntrepreneurPulse.com.au  Fact Sheet for Healthcare Providers: IncredibleEmployment.be  This test is not yet approved or cleared by the [place] FDA and has been authorized for detection and/or diagnosis of SARS-CoV-2 by FDA under an Emergency Use Authorization (EUA). This EUA will remain in effect (meaning this test can be used) for the duration of the COVID-19 declaration under Section 564(b)(1) of the Act, 21 U.S.C. section 360bbb-3(b)(1), unless the authorization is terminated or revoked.  Performed at [facility], [place] [address], [place], [place] [address]     Please note: You were cared for by a hospitalist during your hospital stay. Once you are discharged, your primary care physician will  handle any further medical issues. Please note that NO REFILLS for any discharge medications will be authorized once you are discharged, as it is imperative that you return to your primary care physician (or establish a relationship with a primary care physician if you do not have one) for your post hospital discharge needs so that they can reassess your need for medications and monitor your lab values.    Time coordinating discharge: 40 minutes  SIGNED:   Shelly Coss, MD  [facility] 10/09/2021, 10:39 AM Pager [phone]  If 7PM-7AM, please contact night-coverage [email or web address] Password [email or web address]

## 2021-10-09 NOTE — Progress Notes (Signed)
Physical Therapy Treatment Patient Details Name: Tammy Moses MRN: [id number] DOB: Jan 14, 1966 Today's Date: 10/09/2021   History of Present Illness 55 year old African-American female presented to the ER today via EMS with hypoxia and respiratory distress, +++ BP. PMH with a history of end-stage renal disease needing dialysis on Monday Wednesday Friday with a history of noncompliance, type 2 diabetes, hypertension, anemia chronic disease, COPD, R eye blindness, diabetic retinopathy, neuropathy, chronic tobacco abuse    PT Comments    Pt making steady progress with mobility. Eager to go to SNF and work toward returning home.    Recommendations for follow up therapy are one component of a multi-disciplinary discharge planning process, led by the attending physician.  Recommendations may be updated based on patient status, additional functional criteria and insurance authorization.  Follow Up Recommendations  Skilled nursing-short term rehab (<3 hours/day)     Assistance Recommended at Discharge Intermittent Supervision/Assistance  Equipment Recommendations  None recommended by PT    Recommendations for Other Services       Precautions / Restrictions Precautions Precautions: Fall;Other (comment) Precaution Comments: blind, Rt foot drop     Mobility  Bed Mobility Overal bed mobility: Modified Independent Bed Mobility: Sit to Supine       Sit to supine: Modified independent (Device/Increase time)        Transfers Overall transfer level: Needs assistance Equipment used: Rolling walker (2 wheels) Transfers: Sit to/from Stand Sit to Stand: Min guard           General transfer comment: Assist  for safety    Ambulation/Gait Ambulation/Gait assistance: Min assist Gait Distance (Feet): 150 Feet Assistive device: Rolling walker (2 wheels) Gait Pattern/deviations: Step-through pattern;Decreased dorsiflexion - right;Steppage Gait velocity: decr Gait velocity  interpretation: <1.31 ft/sec, indicative of household ambulator   General Gait Details: Assist to navigate around obstacles and for Print production planner [place] (Stroke Patients Only)       Balance Overall balance assessment: Needs assistance Sitting-balance support: No upper extremity supported;Feet supported Sitting balance-Leahy Scale: Good     Standing balance support: Single extremity supported;Reliant on assistive device for balance Standing balance-Leahy Scale: Poor Standing balance comment: UE support and min guard                            Cognition Arousal/Alertness: Awake/alert Behavior During Therapy: WFL for tasks assessed/performed Overall Cognitive Status: Within Functional Limits for tasks assessed                                          Exercises      General Comments General comments (skin integrity, edema, etc.): VSS on RA      Pertinent Vitals/Pain Faces Pain Scale: Hurts little more Pain Location: R foot Pain Descriptors / Indicators: Sore Pain Intervention(s): Limited activity within patient's tolerance;Monitored during session    Home Living                          Prior Function            PT Goals (current goals can now be found in the care plan section) Acute Rehab PT Goals Patient Stated Goal: get home for Christmas Progress towards PT goals:  Progressing toward goals    Frequency    Min 3X/week      PT Plan Current plan remains appropriate    Co-evaluation              AM-PAC PT "6 Clicks" Mobility   Outcome Measure  Help needed turning from your back to your side while in a flat bed without using bedrails?: None Help needed moving from lying on your back to sitting on the side of a flat bed without using bedrails?: None Help needed moving to and from a bed to a chair (including a wheelchair)?: A Little Help needed standing up  from a chair using your arms (e.g., wheelchair or bedside chair)?: A Little Help needed to walk in hospital room?: A Little Help needed climbing 3-5 steps with a railing? : A Lot 6 Click Score: 19    End of Session Equipment Utilized During Treatment: Gait belt Activity Tolerance: Patient tolerated treatment well Patient left: in bed;with call bell/phone within reach;with bed alarm set   PT Visit Diagnosis: Difficulty in walking, not elsewhere classified (R26.2);Other symptoms and signs involving the nervous system (R29.898)     Time: 1975-8832 PT Time Calculation (min) (ACUTE ONLY): 14 min  Charges:  $Gait Training: 8-22 mins                     [place] Pager [phone] Office [place] 10/09/2021, 2:36 PM

## 2021-10-09 NOTE — TOC Transition Note (Addendum)
Transition of Care [facility]) - CM/SW Discharge Note   Patient Details  Name: NASHLA ALTHOFF MRN: [id number] Date of Birth: 06/13/1966  [facility]) CM/SW Contact:  Milas Gain, [place] Phone Number: 10/09/2021, 11:45 AM   Clinical Narrative:     Patient will DC to: [facility]   Anticipated DC date: 10/09/2021  Family notified: Jeralene Huff  Transport by: Corey Harold  ?  Per MD patient ready for DC to [facility]. RN, patient, patient's family,renal navigator, and facility notified of DC. Discharge Summary sent to facility. RN given number for report tele# [phone] RM# [phone]. DC packet on chart. Ambulance transport requested for patient.  CSW signing off.   Final next level of care: Skilled Nursing Facility Barriers to Discharge: No Barriers Identified   Patient Goals and CMS Choice Patient states their goals for this hospitalization and ongoing recovery are:: SNF CMS Medicare.gov Compare Post Acute Care list provided to:: Patient Choice offered to / list presented to : Patient  Discharge Placement              Patient chooses bed at: [facility] Patient to be transferred to facility by: [place] Name of family member notified: [place] Patient and family notified of of transfer: 10/09/21  Discharge Plan and Services                                     Social Determinants of Health (SDOH) Interventions     Readmission Risk Interventions No flowsheet data found.

## 2021-10-14 ENCOUNTER — Other Ambulatory Visit: Payer: Self-pay | Admitting: *Deleted

## 2021-10-14 NOTE — Patient Outreach (Signed)
[place] [facility]) Care Management  10/14/2021  Tammy Moses [birth year] [id number]  [facility] 9 days and SNF 5 days as of today. Spoke with friend on contact list Arma Heading) who indicated pt currently admitted to SNF for ongoing rehabilitation. States it will be a few weeks before pt comes home.   Will close case due to >10 with hospitalization and rehab facility. Will alert the provider of pt's disposition with [facility] services at this time.  Raina Mina, RN Care Management Coordinator [place] Office [phone]

## 2021-10-21 ENCOUNTER — Emergency Department (HOSPITAL_COMMUNITY): Payer: Medicare Other

## 2021-10-21 ENCOUNTER — Inpatient Hospital Stay (HOSPITAL_COMMUNITY)
Admission: EM | Admit: 2021-10-21 | Discharge: 2021-11-06 | DRG: 252 | Disposition: A | Discharge status: Home Health | Payer: Medicare Other | Attending: Internal Medicine | Admitting: Internal Medicine

## 2021-10-21 ENCOUNTER — Encounter (HOSPITAL_COMMUNITY): Payer: Self-pay | Admitting: *Deleted

## 2021-10-21 DIAGNOSIS — I96 Gangrene, not elsewhere classified: Secondary | ICD-10-CM | POA: Diagnosis present

## 2021-10-21 DIAGNOSIS — E1152 Type 2 diabetes mellitus with diabetic peripheral angiopathy with gangrene: Secondary | ICD-10-CM | POA: Diagnosis not present

## 2021-10-21 DIAGNOSIS — R410 Disorientation, unspecified: Secondary | ICD-10-CM | POA: Diagnosis not present

## 2021-10-21 DIAGNOSIS — S98111A Complete traumatic amputation of right great toe, initial encounter: Secondary | ICD-10-CM

## 2021-10-21 DIAGNOSIS — B1920 Unspecified viral hepatitis C without hepatic coma: Secondary | ICD-10-CM | POA: Diagnosis present

## 2021-10-21 DIAGNOSIS — E1122 Type 2 diabetes mellitus with diabetic chronic kidney disease: Secondary | ICD-10-CM | POA: Diagnosis present

## 2021-10-21 DIAGNOSIS — D62 Acute posthemorrhagic anemia: Secondary | ICD-10-CM | POA: Diagnosis not present

## 2021-10-21 DIAGNOSIS — H409 Unspecified glaucoma: Secondary | ICD-10-CM | POA: Diagnosis present

## 2021-10-21 DIAGNOSIS — E11621 Type 2 diabetes mellitus with foot ulcer: Secondary | ICD-10-CM | POA: Diagnosis present

## 2021-10-21 DIAGNOSIS — L819 Disorder of pigmentation, unspecified: Secondary | ICD-10-CM

## 2021-10-21 DIAGNOSIS — I120 Hypertensive chronic kidney disease with stage 5 chronic kidney disease or end stage renal disease: Secondary | ICD-10-CM | POA: Diagnosis present

## 2021-10-21 DIAGNOSIS — D631 Anemia in chronic kidney disease: Secondary | ICD-10-CM | POA: Diagnosis present

## 2021-10-21 DIAGNOSIS — N2581 Secondary hyperparathyroidism of renal origin: Secondary | ICD-10-CM | POA: Diagnosis present

## 2021-10-21 DIAGNOSIS — L97519 Non-pressure chronic ulcer of other part of right foot with unspecified severity: Secondary | ICD-10-CM | POA: Diagnosis present

## 2021-10-21 DIAGNOSIS — F17210 Nicotine dependence, cigarettes, uncomplicated: Secondary | ICD-10-CM | POA: Diagnosis present

## 2021-10-21 DIAGNOSIS — J449 Chronic obstructive pulmonary disease, unspecified: Secondary | ICD-10-CM | POA: Diagnosis present

## 2021-10-21 DIAGNOSIS — I70203 Unspecified atherosclerosis of native arteries of extremities, bilateral legs: Secondary | ICD-10-CM | POA: Diagnosis present

## 2021-10-21 DIAGNOSIS — S82091A Other fracture of right patella, initial encounter for closed fracture: Secondary | ICD-10-CM

## 2021-10-21 DIAGNOSIS — I700 Atherosclerosis of aorta: Secondary | ICD-10-CM | POA: Diagnosis present

## 2021-10-21 DIAGNOSIS — E11319 Type 2 diabetes mellitus with unspecified diabetic retinopathy without macular edema: Secondary | ICD-10-CM | POA: Diagnosis present

## 2021-10-21 DIAGNOSIS — E1140 Type 2 diabetes mellitus with diabetic neuropathy, unspecified: Secondary | ICD-10-CM | POA: Diagnosis present

## 2021-10-21 DIAGNOSIS — S82001A Unspecified fracture of right patella, initial encounter for closed fracture: Secondary | ICD-10-CM

## 2021-10-21 DIAGNOSIS — Z9115 Patient's noncompliance with renal dialysis: Secondary | ICD-10-CM

## 2021-10-21 DIAGNOSIS — Z20822 Contact with and (suspected) exposure to covid-19: Secondary | ICD-10-CM | POA: Diagnosis present

## 2021-10-21 DIAGNOSIS — Z888 Allergy status to other drugs, medicaments and biological substances status: Secondary | ICD-10-CM

## 2021-10-21 DIAGNOSIS — M25561 Pain in right knee: Secondary | ICD-10-CM | POA: Diagnosis not present

## 2021-10-21 DIAGNOSIS — K219 Gastro-esophageal reflux disease without esophagitis: Secondary | ICD-10-CM | POA: Diagnosis present

## 2021-10-21 DIAGNOSIS — R5381 Other malaise: Secondary | ICD-10-CM | POA: Clinically undetermined

## 2021-10-21 DIAGNOSIS — B182 Chronic viral hepatitis C: Secondary | ICD-10-CM | POA: Diagnosis present

## 2021-10-21 DIAGNOSIS — E7800 Pure hypercholesterolemia, unspecified: Secondary | ICD-10-CM | POA: Diagnosis present

## 2021-10-21 DIAGNOSIS — H547 Unspecified visual loss: Secondary | ICD-10-CM

## 2021-10-21 DIAGNOSIS — N186 End stage renal disease: Secondary | ICD-10-CM | POA: Diagnosis present

## 2021-10-21 DIAGNOSIS — Z833 Family history of diabetes mellitus: Secondary | ICD-10-CM

## 2021-10-21 DIAGNOSIS — I10 Essential (primary) hypertension: Secondary | ICD-10-CM | POA: Diagnosis present

## 2021-10-21 DIAGNOSIS — H5440 Blindness, one eye, unspecified eye: Secondary | ICD-10-CM

## 2021-10-21 DIAGNOSIS — Z79899 Other long term (current) drug therapy: Secondary | ICD-10-CM

## 2021-10-21 DIAGNOSIS — E785 Hyperlipidemia, unspecified: Secondary | ICD-10-CM | POA: Diagnosis present

## 2021-10-21 DIAGNOSIS — I080 Rheumatic disorders of both mitral and aortic valves: Secondary | ICD-10-CM | POA: Diagnosis present

## 2021-10-21 DIAGNOSIS — S82011A Displaced osteochondral fracture of right patella, initial encounter for closed fracture: Secondary | ICD-10-CM | POA: Diagnosis present

## 2021-10-21 DIAGNOSIS — Z8349 Family history of other endocrine, nutritional and metabolic diseases: Secondary | ICD-10-CM

## 2021-10-21 DIAGNOSIS — H548 Legal blindness, as defined in USA: Secondary | ICD-10-CM | POA: Diagnosis present

## 2021-10-21 DIAGNOSIS — I2510 Atherosclerotic heart disease of native coronary artery without angina pectoris: Secondary | ICD-10-CM | POA: Diagnosis present

## 2021-10-21 DIAGNOSIS — I739 Peripheral vascular disease, unspecified: Secondary | ICD-10-CM

## 2021-10-21 DIAGNOSIS — Z992 Dependence on renal dialysis: Secondary | ICD-10-CM

## 2021-10-21 DIAGNOSIS — W000XXA Fall on same level due to ice and snow, initial encounter: Secondary | ICD-10-CM | POA: Diagnosis present

## 2021-10-21 DIAGNOSIS — Z794 Long term (current) use of insulin: Secondary | ICD-10-CM

## 2021-10-21 LAB — GLUCOSE, CAPILLARY: Glucose-Capillary: 118 mg/dL — ABNORMAL HIGH (ref 70–99)

## 2021-10-21 LAB — CBC WITH DIFFERENTIAL/PLATELET
Abs Immature Granulocytes: 0.06 10*3/uL (ref 0.00–0.07)
Basophils Absolute: 0.1 10*3/uL (ref 0.0–0.1)
Basophils Relative: 1 %
Eosinophils Absolute: 0.1 10*3/uL (ref 0.0–0.5)
Eosinophils Relative: 1 %
HCT: 29.1 % — ABNORMAL LOW (ref 36.0–46.0)
Hemoglobin: 8.9 g/dL — ABNORMAL LOW (ref 12.0–15.0)
Immature Granulocytes: 1 %
Lymphocytes Relative: 17 %
Lymphs Abs: 1.9 10*3/uL (ref 0.7–4.0)
MCH: 28.2 pg (ref 26.0–34.0)
MCHC: 30.6 g/dL (ref 30.0–36.0)
MCV: 92.1 fL (ref 80.0–100.0)
Monocytes Absolute: 1.5 10*3/uL — ABNORMAL HIGH (ref 0.1–1.0)
Monocytes Relative: 13 %
Neutro Abs: 7.6 10*3/uL (ref 1.7–7.7)
Neutrophils Relative %: 67 %
Platelets: 507 10*3/uL — ABNORMAL HIGH (ref 150–400)
RBC: 3.16 MIL/uL — ABNORMAL LOW (ref 3.87–5.11)
RDW: 16.3 % — ABNORMAL HIGH (ref 11.5–15.5)
WBC: 11.3 10*3/uL — ABNORMAL HIGH (ref 4.0–10.5)
nRBC: 0 % (ref 0.0–0.2)

## 2021-10-21 LAB — BASIC METABOLIC PANEL
Anion gap: 15 (ref 5–15)
BUN: 49 mg/dL — ABNORMAL HIGH (ref 6–20)
CO2: 24 mmol/L (ref 22–32)
Calcium: 9.4 mg/dL (ref 8.9–10.3)
Chloride: 98 mmol/L (ref 98–111)
Creatinine, Ser: 9.29 mg/dL — ABNORMAL HIGH (ref 0.44–1.00)
GFR, Estimated: 5 mL/min — ABNORMAL LOW (ref >60)
Glucose, Bld: 106 mg/dL — ABNORMAL HIGH (ref 70–99)
Potassium: 4.5 mmol/L (ref 3.5–5.1)
Sodium: 137 mmol/L (ref 135–145)

## 2021-10-21 LAB — PROTIME-INR
INR: 1.2 (ref 0.8–1.2)
Prothrombin Time: 14.9 seconds (ref 11.4–15.2)

## 2021-10-21 LAB — CBG MONITORING, ED
Glucose-Capillary: 127 mg/dL — ABNORMAL HIGH (ref 70–99)
Glucose-Capillary: 72 mg/dL (ref 70–99)

## 2021-10-21 LAB — RESP PANEL BY RT-PCR (FLU A&B, COVID) ARPGX2
Influenza A by PCR: NEGATIVE
Influenza B by PCR: NEGATIVE
SARS Coronavirus 2 by RT PCR: NEGATIVE

## 2021-10-21 MED ORDER — BUPROPION HCL ER (SR) 150 MG PO TB12
150.0000 mg | ORAL_TABLET | Freq: Every day | ORAL | Status: DC
  Administered 2021-10-21 – 2021-11-06 (×16): 150 mg via ORAL
  Filled 2021-10-21 (×17): qty 1

## 2021-10-21 MED ORDER — METOCLOPRAMIDE HCL 5 MG PO TABS
10.0000 mg | ORAL_TABLET | Freq: Two times a day (BID) | ORAL | Status: DC | PRN
  Administered 2021-10-25: 10:00:00 10 mg via ORAL
  Filled 2021-10-21: qty 2

## 2021-10-21 MED ORDER — LATANOPROST 0.005 % OP SOLN
1.0000 [drp] | Freq: Every day | OPHTHALMIC | Status: DC
  Administered 2021-10-21 – 2021-11-05 (×16): 1 [drp] via OPHTHALMIC
  Filled 2021-10-21 (×5): qty 2.5

## 2021-10-21 MED ORDER — ALBUTEROL SULFATE (2.5 MG/3ML) 0.083% IN NEBU: 3.0000 mL | INHALATION_SOLUTION | RESPIRATORY_TRACT | Status: DC | PRN

## 2021-10-21 MED ORDER — HEPARIN SODIUM (PORCINE) 5000 UNIT/ML IJ SOLN
5000.0000 [IU] | Freq: Two times a day (BID) | INTRAMUSCULAR | Status: DC
  Administered 2021-10-21 – 2021-11-05 (×30): 5000 [IU] via SUBCUTANEOUS
  Filled 2021-10-21 (×29): qty 1

## 2021-10-21 MED ORDER — ATROPINE SULFATE 1 % OP SOLN
1.0000 [drp] | Freq: Two times a day (BID) | OPHTHALMIC | Status: DC
  Administered 2021-10-21 – 2021-11-06 (×31): 1 [drp] via OPHTHALMIC
  Filled 2021-10-21 (×4): qty 2

## 2021-10-21 MED ORDER — CHLORHEXIDINE GLUCONATE CLOTH 2 % EX PADS
6.0000 | MEDICATED_PAD | Freq: Every day | CUTANEOUS | Status: DC
  Administered 2021-10-22 – 2021-11-06 (×13): 6 via TOPICAL

## 2021-10-21 MED ORDER — AMLODIPINE BESYLATE 10 MG PO TABS
10.0000 mg | ORAL_TABLET | Freq: Every day | ORAL | Status: DC
  Administered 2021-10-21 – 2021-11-06 (×14): 10 mg via ORAL
  Filled 2021-10-21 (×12): qty 1
  Filled 2021-10-21: qty 2
  Filled 2021-10-21: qty 1

## 2021-10-21 MED ORDER — HYDROMORPHONE HCL 1 MG/ML IJ SOLN
0.5000 mg | INTRAMUSCULAR | Status: DC | PRN
  Administered 2021-10-21 – 2021-11-03 (×21): 0.5 mg via INTRAVENOUS
  Filled 2021-10-21 (×2): qty 1
  Filled 2021-10-21 (×2): qty 0.5
  Filled 2021-10-21: qty 1
  Filled 2021-10-21: qty 0.5
  Filled 2021-10-21 (×4): qty 1
  Filled 2021-10-21: qty 0.5
  Filled 2021-10-21 (×3): qty 1
  Filled 2021-10-21 (×2): qty 0.5
  Filled 2021-10-21 (×3): qty 1
  Filled 2021-10-21 (×3): qty 0.5

## 2021-10-21 MED ORDER — PRAVASTATIN SODIUM 40 MG PO TABS
80.0000 mg | ORAL_TABLET | Freq: Every day | ORAL | Status: DC
  Administered 2021-10-21 – 2021-11-01 (×10): 80 mg via ORAL
  Filled 2021-10-21 (×10): qty 2

## 2021-10-21 MED ORDER — GABAPENTIN 600 MG PO TABS
600.0000 mg | ORAL_TABLET | Freq: Two times a day (BID) | ORAL | Status: DC
  Administered 2021-10-21 – 2021-10-22 (×2): 600 mg via ORAL
  Filled 2021-10-21 (×3): qty 1

## 2021-10-21 MED ORDER — DILTIAZEM GEL 2 %: 1.0000 "application " | Freq: Every day | CUTANEOUS | Status: DC

## 2021-10-21 MED ORDER — HYDRALAZINE HCL 50 MG PO TABS
100.0000 mg | ORAL_TABLET | Freq: Three times a day (TID) | ORAL | Status: DC
  Administered 2021-10-21: 11:00:00 100 mg via ORAL
  Filled 2021-10-21: qty 4

## 2021-10-21 MED ORDER — LAMOTRIGINE 25 MG PO TABS
25.0000 mg | ORAL_TABLET | Freq: Every day | ORAL | Status: DC
  Administered 2021-10-22 – 2021-11-06 (×15): 25 mg via ORAL
  Filled 2021-10-21 (×16): qty 1

## 2021-10-21 MED ORDER — FUROSEMIDE 40 MG PO TABS
40.0000 mg | ORAL_TABLET | Freq: Every day | ORAL | Status: DC
  Administered 2021-10-21 – 2021-10-23 (×3): 40 mg via ORAL
  Filled 2021-10-21 (×2): qty 1
  Filled 2021-10-21: qty 2

## 2021-10-21 MED ORDER — ACETAMINOPHEN 500 MG PO TABS
1000.0000 mg | ORAL_TABLET | Freq: Four times a day (QID) | ORAL | Status: DC | PRN
  Administered 2021-10-24 – 2021-11-06 (×6): 1000 mg via ORAL
  Filled 2021-10-21 (×6): qty 2

## 2021-10-21 MED ORDER — HYDRALAZINE HCL 25 MG PO TABS
100.0000 mg | ORAL_TABLET | Freq: Three times a day (TID) | ORAL | Status: DC
  Administered 2021-10-21 – 2021-11-05 (×39): 100 mg via ORAL
  Filled 2021-10-21 (×44): qty 4

## 2021-10-21 MED ORDER — SENNOSIDES-DOCUSATE SODIUM 8.6-50 MG PO TABS
1.0000 | ORAL_TABLET | Freq: Every evening | ORAL | Status: DC | PRN
  Administered 2021-10-22 – 2021-11-01 (×2): 1 via ORAL
  Filled 2021-10-21 (×2): qty 1

## 2021-10-21 MED ORDER — ACETAMINOPHEN 325 MG PO TABS
650.0000 mg | ORAL_TABLET | Freq: Once | ORAL | Status: AC
  Administered 2021-10-21: 11:00:00 650 mg via ORAL
  Filled 2021-10-21: qty 2

## 2021-10-21 MED ORDER — INSULIN ASPART 100 UNIT/ML IJ SOLN
0.0000 [IU] | Freq: Three times a day (TID) | INTRAMUSCULAR | Status: DC
  Administered 2021-10-24 – 2021-11-06 (×4): 1 [IU] via SUBCUTANEOUS

## 2021-10-21 MED ORDER — HYDROCODONE-ACETAMINOPHEN 5-325 MG PO TABS
1.0000 | ORAL_TABLET | Freq: Four times a day (QID) | ORAL | Status: DC | PRN
  Administered 2021-10-22: 22:00:00 1 via ORAL
  Administered 2021-10-24 – 2021-10-29 (×11): 2 via ORAL
  Administered 2021-10-29: 11:00:00 1 via ORAL
  Administered 2021-10-29: 16:00:00 2 via ORAL
  Administered 2021-10-29: 1 via ORAL
  Administered 2021-10-30 (×2): 2 via ORAL
  Administered 2021-10-30: 1 via ORAL
  Administered 2021-10-31 – 2021-11-04 (×14): 2 via ORAL
  Filled 2021-10-21 (×29): qty 2
  Filled 2021-10-21 (×2): qty 1
  Filled 2021-10-21 (×2): qty 2

## 2021-10-21 MED ORDER — DORZOLAMIDE HCL-TIMOLOL MAL 2-0.5 % OP SOLN
1.0000 [drp] | Freq: Two times a day (BID) | OPHTHALMIC | Status: DC
  Administered 2021-10-21 – 2021-11-06 (×31): 1 [drp] via OPHTHALMIC
  Filled 2021-10-21 (×5): qty 10

## 2021-10-21 MED ORDER — GABAPENTIN 300 MG PO CAPS
600.0000 mg | ORAL_CAPSULE | Freq: Two times a day (BID) | ORAL | Status: DC
  Administered 2021-10-21: 11:00:00 600 mg via ORAL
  Filled 2021-10-21: qty 2

## 2021-10-21 MED ORDER — CALCIUM ACETATE (PHOS BINDER) 667 MG PO CAPS
2001.0000 mg | ORAL_CAPSULE | Freq: Three times a day (TID) | ORAL | Status: DC
  Administered 2021-10-21 – 2021-11-03 (×21): 2001 mg via ORAL
  Filled 2021-10-21 (×27): qty 3

## 2021-10-21 NOTE — ED Notes (Signed)
Tammy Moses friend [phone] requesting to speak to the patient

## 2021-10-21 NOTE — H&P (Signed)
History and Physical    Tammy Moses YQM:[id number] DOB: 1966-09-27 DOA: 10/21/2021  PCP: [name], FNP (Confirm with patient/family/NH records and if not entered, this has to be entered at [facility] point of entry) Patient coming from: Home  I have personally briefly reviewed patient's old medical records in [place]  Chief Complaint: Right knee pain  HPI: Tammy Moses is a 55 y.o. female with medical history significant of ESRD on HD MWF, HTN, normocytic anemia secondary to CKD, COPD, blindness, cigarette smoker, presented with persistent right knee pain.  Patient fell down yesterday after completing scheduled HD session, when she slipped on ice and fell on her right knee.  She was able to stand up resolved but since yesterday she experienced increasing pain and swelling of the right knee with minimal movement. ED Course: Nondisplaced right patella fracture found on x-ray.  Blood pressure elevated, no hypoxia.  Patient was admitted by orthopedic surgery and recommend right knee brace and physical therapy.  Physical therapy ED recommend SNF.  Review of Systems: As per HPI otherwise 14 point review of systems negative.    Past Medical History:  Diagnosis Date   Anemia    Anxiety    Blind right eye    Chronic kidney disease, stage V (HCC)    Constipation, chronic    Dental caries    Diabetes mellitus    Diabetic neuropathy ([place])    Diabetic retinopathy    GERD (gastroesophageal reflux disease)    Glaucoma    H. pylori infection    Hepatitis C carrier ([place])    High risk sexual behavior    Hyperlipidemia    Hypertension    Hypoglycemia 07/12/2017   Insomnia    Microalbuminuria    Nonspecific elevation of levels of transaminase or lactic acid dehydrogenase (LDH)    Tobacco dependence    Vitamin D deficiency     Past Surgical History:  Procedure Laterality Date   ABDOMINAL HYSTERECTOMY  04/30/2009   PARTIAL   COLONOSCOPY     DIALYSIS FISTULA CREATION  Left 06/2017   ESOPHAGOGASTRODUODENOSCOPY     IR AV DIALY SHUNT INTRO NEEDLE/INTRACATH INITIAL W/PTA/IMG RIGHT Right 10/07/2021   IR FLUORO GUIDE CV LINE RIGHT  10/06/2021   IR US GUIDE VASC ACCESS RIGHT  10/06/2021   IR US GUIDE VASC ACCESS RIGHT  10/08/2021     reports that she has been smoking cigarettes. She has been smoking an average of .25 packs per day. She has never used smokeless tobacco. She reports current alcohol use. She reports that she does not currently use drugs after having used the following drugs: Marijuana and Cocaine.  Allergies  Allergen Reactions   Acyclovir And Related Itching    Family History  Problem Relation Age of Onset   High blood pressure Mother    Diabetes Mother    Thyroid disease Mother    Diabetes Father    High blood pressure Father    Cerebral palsy Daughter    Other Son        still born     Prior to Admission medications   Medication Sig Start Date End Date Taking? Authorizing Provider  acetaminophen (TYLENOL) 500 MG tablet Take 1,000 mg by mouth every 6 (six) hours as needed for mild pain.    [provider]  albuterol (VENTOLIN HFA) 108 (90 Base) MCG/ACT inhaler Inhale 2 puffs into the lungs every 4 (four) hours as needed for shortness of breath. For  shortness of breath 12/11/19   [name], FNP  amLODipine (NORVASC) 10 MG tablet TAKE 1 TABLET(10 MG) BY MOUTH DAILY Patient taking differently: Take 10 mg by mouth daily. 02/10/21   [name], FNP  atropine 1 % ophthalmic solution Place 1 drop into both eyes 2 (two) times daily.    [provider]  blood glucose meter kit and supplies KIT Dispense based on patient and insurance preference. Use up to four times daily as directed. (FOR ICD-10 E.11.9) 11/30/17   Scot Jun, FNP  buPROPion (ZYBAN) 150 MG 12 hr tablet Take 150 mg by mouth daily. 01/16/21   [provider]  calcium acetate (PHOSLO) 667 MG capsule Take 2,001 mg by mouth 3 (three)  times daily with meals. 07/06/18   [provider]  diltiazem 2 % GEL Apply 1 application topically 5 (five) times daily. 06/19/21   Irene Shipper, MD  dorzolamide-timolol (COSOPT) 22.3-6.8 MG/ML ophthalmic solution Place 1 drop into both eyes 2 (two) times daily. 12/08/20   [provider]  furosemide (LASIX) 40 MG tablet Take 1 tablet (40 mg total) by mouth daily. 12/11/19   [name], FNP  gabapentin (NEURONTIN) 600 MG tablet Take 1 tablet (600 mg total) by mouth 2 (two) times daily. 12/11/19   [name], FNP  glucose blood (ONE TOUCH ULTRA TEST) test strip USE TO TEST 3 TIMES A DAY 08/15/20   [name], FNP  hydrALAZINE (APRESOLINE) 100 MG tablet Take 1 tablet (100 mg total) by mouth every 8 (eight) hours. 10/09/21   Shelly Coss, MD  Insulin Pen Needle 29G X 12MM MISC E11.9 Dx Code Use once at bedtime with each nightly insulin 03/11/17   Scot Jun, FNP  lamoTRIgine (LAMICTAL) 25 MG tablet Take 25 mg by mouth daily. 01/16/21   [provider]  Lancets Glory Rosebush ULTRASOFT) lancets Use as instructed 07/10/19   [name], FNP  latanoprost (XALATAN) 0.005 % ophthalmic solution Place 1 drop into both eyes at bedtime. 12/16/20   [provider]  metoCLOPramide (REGLAN) 10 MG tablet Take 10 mg by mouth 2 (two) times daily as needed for nausea. 09/22/21   [provider]  pravastatin (PRAVACHOL) 80 MG tablet Take 1 tablet (80 mg total) by mouth daily. 12/11/19   [name], FNP    Physical Exam: Vitals:   10/21/21 0751 10/21/21 1004  BP: (!) 229/66 (!) 211/109  Pulse: 95 92  Resp: 18 14  Temp: 99.7 F (37.6 C)   TempSrc: Oral   SpO2: 90% 94%    Constitutional: NAD, calm, comfortable Vitals:   10/21/21 0751 10/21/21 1004  BP: (!) 229/66 (!) 211/109  Pulse: 95 92  Resp: 18 14  Temp: 99.7 F (37.6 C)   TempSrc: Oral   SpO2: 90% 94%   Eyes: Right eye blind ENMT: Mucous membranes are moist. Posterior  pharynx clear of any exudate or lesions.Normal dentition.  Neck: normal, supple, no masses, no thyromegaly Respiratory: clear to auscultation bilaterally, no wheezing, no crackles. Normal respiratory effort. No accessory muscle use.  Cardiovascular: Regular rate and rhythm, no murmurs / rubs / gallops. No extremity edema. 2+ pedal pulses. No carotid bruits.  Abdomen: no tenderness, no masses palpated. No hepatosplenomegaly. Bowel sounds positive.  Musculoskeletal: Right knee in immobilizer Skin: Dorsal side of skin on right big toe discoloration, nontender no swelling, pedal pulses 2+ equally bilateral Neurologic: CN 2-12 grossly intact. Sensation intact, DTR normal. Strength 5/5  in all 4.  Psychiatric: Normal judgment and insight. Alert and oriented x 3. Normal mood.     Labs on Admission: I have personally reviewed following labs and imaging studies  CBC: Recent Labs  Lab 10/21/21 1425  WBC 11.3*  NEUTROABS 7.6  HGB 8.9*  HCT 29.1*  MCV 92.1  PLT 458*   Basic Metabolic Panel: Recent Labs  Lab 10/21/21 1425  NA 137  K 4.5  CL 98  CO2 24  GLUCOSE 106*  BUN 49*  CREATININE 9.29*  CALCIUM 9.4   GFR: Estimated Creatinine Clearance: 5 mL/min (A) (by C-G formula based on SCr of 9.29 mg/dL (H)). Liver Function Tests: No results for input(s): AST, ALT, ALKPHOS, BILITOT, PROT, ALBUMIN in the last 168 hours. No results for input(s): LIPASE, AMYLASE in the last 168 hours. No results for input(s): AMMONIA in the last 168 hours. Coagulation Profile: Recent Labs  Lab 10/21/21 1425  INR 1.2   Cardiac Enzymes: No results for input(s): CKTOTAL, CKMB, CKMBINDEX, TROPONINI in the last 168 hours. BNP (last 3 results) No results for input(s): PROBNP in the last 8760 hours. HbA1C: No results for input(s): HGBA1C in the last 72 hours. CBG: Recent Labs  Lab 10/21/21 0805  GLUCAP 127*   Lipid Profile: No results for input(s): CHOL, HDL, LDLCALC, TRIG, CHOLHDL, LDLDIRECT in the  last 72 hours. Thyroid Function Tests: No results for input(s): TSH, T4TOTAL, FREET4, T3FREE, THYROIDAB in the last 72 hours. Anemia Panel: No results for input(s): VITAMINB12, FOLATE, FERRITIN, TIBC, IRON, RETICCTPCT in the last 72 hours. Urine analysis:    Component Value Date/Time   COLORURINE YELLOW 09/16/2021 1511   APPEARANCEUR HAZY (A) 09/16/2021 1511   LABSPEC 1.013 09/16/2021 1511   PHURINE 5.0 09/16/2021 1511   GLUCOSEU >=500 (A) 09/16/2021 1511   HGBUR SMALL (A) 09/16/2021 1511   BILIRUBINUR NEGATIVE 09/16/2021 1511   BILIRUBINUR Negative 12/11/2019 0809   KETONESUR 5 (A) 09/16/2021 1511   PROTEINUR 100 (A) 09/16/2021 1511   UROBILINOGEN 0.2 12/11/2019 0809   UROBILINOGEN 0.2 12/13/2017 1353   NITRITE NEGATIVE 09/16/2021 1511   LEUKOCYTESUR NEGATIVE 09/16/2021 1511    Radiological Exams on Admission: DG Knee Complete 4 Views Right  Result Date: 10/21/2021 CLINICAL DATA:  Knee pain after a fall yesterday. EXAM: RIGHT KNEE - COMPLETE 4+ VIEW COMPARISON:  None. FINDINGS: There is a nondisplaced fracture of the lower pole of the patella. There is mild overlying soft tissue swelling. There is no dislocation, and no sizable knee joint effusion is evident. Femorotibial joint space widths are preserved. Extensive atherosclerotic vascular calcifications are noted. IMPRESSION: Nondisplaced patellar fracture. Electronically Signed   By: Logan Bores M.D.   On: 10/21/2021 08:32   DG Foot Complete Right  Result Date: 10/21/2021 CLINICAL DATA:  Fall yesterday.  Toe pain and discoloration. EXAM: RIGHT FOOT COMPLETE - 3+ VIEW COMPARISON:  09/22/2021 FINDINGS: No acute fracture or dislocation is identified. No osseous erosion is evident. Extensive atherosclerotic vascular calcifications are again noted. IMPRESSION: No acute osseous abnormality identified. Electronically Signed   By: Logan Bores M.D.   On: 10/21/2021 08:35    EKG: ordered.  Assessment/Plan Active Problems:   * No  active hospital problems. *  (please populate well all problems here in Problem List. (For example, if patient is on BP meds at home and you resume or decide to hold them, it is a problem that needs to be her. Same for CAD, COPD, HLD and so on)  Right knee patella  nondisplaced fracture -Knee immobilizer and physical therapy, probably will need SNF placement.  Consult case management. -Pain control.  Right big toe discoloration -With clear demarcation, nontender, I do not see any signs of dry gangrene or changes or any active signs of infection, suspect old hematoma underneath.   -ABI ordered at ED, depends on the result will consider further work-up management plan.  HTN, uncontrolled -Resume home BP meds amlodipine hydralazine.  ESRD on HD -If patient stay through tomorrow, will consult nephrology in AM.  IIDM -Sliding scale  HLD -On statin.  DVT prophylaxis: Heparin subcu Code Status: Full code Family Communication: None at bedside Disposition Plan: Expect less than 2 midnight hospital stay Consults called: Orthopedic surgery Admission status: MedSurg observation   Lequita Halt MD [facility] Pager [phone]  10/21/2021, 3:17 PM

## 2021-10-21 NOTE — TOC Initial Note (Signed)
Transition of Care [facility]) - Initial/Assessment Note    Patient Details  Name: Tammy Moses MRN: [id number] Date of Birth: [birth year]  Transition of [facility] CM/SW Contact:    Raina Mina, [place] Phone Number: 10/21/2021, 3:19 PM  Clinical Narrative:  CSW spoke with patient and her niece Jesse Fall. Patient agreed to SNF and was aware of possible discharge home if insurance will not cover stay. Patient stated she wants to consider long term care once in a facility. Patient was discharged from [facility].             Expected Discharge Plan: Skilled Nursing Facility Barriers to Discharge: Continued Medical Work up   Patient Goals and CMS Choice Patient states their goals for this hospitalization and ongoing recovery are:: Return home or long term care      Expected Discharge Plan and Services Expected Discharge Plan: [place]       Living arrangements for the past 2 months: [place], Apartment                   DME Agency: [employer]       HH Arranged: PT [place] Agency: [place] ([place])        Prior Living Arrangements/Services Living arrangements for the past 2 months: [place], [place] with:: Self Patient language and need for interpreter reviewed:: Yes Do you feel safe going back to the place where you live?: Yes      Need for Family Participation in Patient Care: Yes (Comment) Care giver support system in place?: Yes (comment) Current home services: Homehealth aide ([place]) Criminal Activity/Legal Involvement Pertinent to Current Situation/Hospitalization: No - Comment as needed  Activities of Daily Living      Permission Sought/Granted Permission sought to share information with : Family Supports Permission granted to share information with : Yes, Verbal Permission Granted  Share Information with NAME: Jesse Fall     Permission granted to share info w  Relationship: [place]  Permission granted to share info w Contact Information: [phone]  Emotional Assessment Appearance:: Appears older than stated age Attitude/Demeanor/Rapport: Engaged Affect (typically observed): Accepting Orientation: : Oriented to Self, Oriented to Place, Oriented to  Time, Oriented to Situation   Psych Involvement: No (comment)  Admission diagnosis:  Knee Pain; Fall yesterday Patient Active Problem List   Diagnosis Date Noted   Malignant hypertension 09/30/2021   Flash pulmonary edema ([place]) 09/30/2021   Non compliance with medical treatment 09/30/2021   Foot pain, right 09/22/2021   Elevated troponin 09/16/2021   Anemia due to chronic kidney disease 09/16/2021   Impaired functional mobility and activity tolerance 01/04/2019   Pneumonia of both lungs due to infectious organism 10/05/2017   Fall    Anemia 07/12/2017   COPD (chronic obstructive pulmonary disease) ([place]) 06/24/2017   ESRD (end stage renal disease) ([place]) -  [facility]ialyzes at [facility].  On Monday Wednesday Friday. 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[place]) 05/10/2017   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   Vitamin D deficiency 04/07/2017   Absolute glaucoma of right eye 12/27/2016   Chronic angle-closure glaucoma of left eye, severe stage 12/27/2016   PCO (posterior capsule opacification), left 12/27/2016   Gait abnormality 03/07/2014  Abnormality of gait 03/07/2014   Hepatitis C virus infection without hepatic coma 03/04/2014   History of hepatitis C 03/04/2014   Erosive gastritis 02/14/2014   Pseudophakia of left eye 10/30/2012   Asthma 10/13/2012   Reflux 10/13/2012   Lens replaced by other means 10/12/2012   Primary open angle glaucoma 10/12/2012    BACK PAIN, LUMBAR 08/18/2010   IDDM 08/11/2010   HYPERCHOLESTEROLEMIA 08/11/2010   HYPOKALEMIA 08/11/2010   DEPRESSION 08/11/2010   GLAUCOMA 08/11/2010   BLINDNESS, RIGHT EYE 08/11/2010   Essential hypertension 08/11/2010   GERD 08/11/2010   CONSTIPATION 08/06/2009   PCP:  [name], FNP Pharmacy:   [place], [place] - [address] [place] [address] Phone: [phone] Fax: [phone]  [facility] [id number] - [place], [place] - [phone] [place] [address] [place] [address] Phone: [phone] Fax: [phone]  [facility] [address] [place] [address] Phone: [phone] Fax: [phone]     Social Determinants of Health (SDOH) Interventions    Readmission Risk Interventions Readmission Risk Prevention Plan 10/09/2021  Transportation Screening Complete  HRI or Home Care Consult Complete  SW Recovery Care/Counseling Consult Complete  Palliative Care Screening Not Applicable  Skilled Nursing Facility Complete  Some recent data might be hidden

## 2021-10-21 NOTE — Evaluation (Signed)
Physical Therapy Evaluation Patient Details Name: Tammy Moses MRN: [id number] DOB: 03-03-1966 Today's Date: 10/21/2021  History of Present Illness  Pt is a 55 y/o female presenting to the ED on 12/28 secondary to fall. Found to have R patellar fx. PMH includes ESRD on HD, HTN, DM, COPD, blindness, tobacco abuse.  Clinical Impression  Pt admitted secondary to problem above with deficits below. Pt requiring mod A +2 to stand and ambulate short distances. Pt with increased buckling and jerking type movements in L knee. Pt currently at increased for falls and currently lives alone. Recommending SNF level therapies at d/c. However, if pt not eligible will need max HH services and increased physical assist at home to ensure safety with mobility. Will continue to follow acutely.        Recommendations for follow up therapy are one component of a multi-disciplinary discharge planning process, led by the attending physician.  Recommendations may be updated based on patient status, additional functional criteria and insurance authorization.  Follow Up Recommendations Skilled nursing-short term rehab (<3 hours/day) (max HH if she does not qualify)    Assistance Recommended at Discharge Frequent or constant Supervision/Assistance  Functional Status Assessment Patient has had a recent decline in their functional status and demonstrates the ability to make significant improvements in function in a reasonable and predictable amount of time.  Equipment Recommendations  BSC/3in1;Rolling walker (2 wheels)    Recommendations for Other Services       Precautions / Restrictions Precautions Precautions: Fall Precaution Comments: blind, R foot drop Restrictions Weight Bearing Restrictions: Yes RLE Weight Bearing: Non weight bearing      Mobility  Bed Mobility               General bed mobility comments: In recliner in ED    Transfers Overall transfer level: Needs assistance Equipment used:  Rolling walker (2 wheels) Transfers: Sit to/from Stand Sit to Stand: Mod assist;+2 physical assistance           General transfer comment: Required mod A for lift assist and steadying to stand. Had increased difficulty as she fatigued.    Ambulation/Gait Ambulation/Gait assistance: Mod assist;+2 physical assistance Gait Distance (Feet): 10 Feet (X2) Assistive device: Rolling walker (2 wheels) Gait Pattern/deviations: Step-to pattern Gait velocity: Decreased     General Gait Details: Hop to pattern to ambulate to toilet and back to recliner. Noted jerking and buckling type movements in L knee. Increased unsteadiness noted as pt fatigued.  Stairs            Wheelchair Mobility    Modified Rankin (Stroke Patients Only)       Balance Overall balance assessment: Needs assistance Sitting-balance support: No upper extremity supported;Feet supported Sitting balance-Leahy Scale: Fair     Standing balance support: Bilateral upper extremity supported;During functional activity Standing balance-Leahy Scale: Poor Standing balance comment: Reliant on BUE support                             Pertinent Vitals/Pain Pain Assessment: Faces Faces Pain Scale: Hurts even more Pain Location: R knee Pain Descriptors / Indicators: Grimacing;Guarding Pain Intervention(s): Limited activity within patient's tolerance;Monitored during session    Home Living Family/patient expects to be discharged to:: Private residence Living Arrangements: Alone Available Help at Discharge: Personal care attendant;Available PRN/intermittently Type of Home: Apartment Home Access: Level entry       Home Layout: One level Home Equipment: Rollator (4 wheels);Cane -  single point;Toilet riser;Wheelchair - manual      Prior Function Prior Level of Function : History of Falls (last six months);Needs assist             Mobility Comments: Multiple falls. Uses WC occasionally inside; uses  rollator mostly inside. ADLs Comments: Aide comes 2 hours/day     Hand Dominance        Extremity/Trunk Assessment   Upper Extremity Assessment Upper Extremity Assessment: Defer to OT evaluation    Lower Extremity Assessment Lower Extremity Assessment: RLE deficits/detail;Generalized weakness;LLE deficits/detail RLE Deficits / Details: R foot drop at baseline. Pt in [place] throughout LLE Deficits / Details: Jerking type movements in LLE during stance phase    Cervical / Trunk Assessment Cervical / Trunk Assessment: Normal  Communication   Communication: No difficulties  Cognition Arousal/Alertness: Awake/alert Behavior During Therapy: WFL for tasks assessed/performed Overall Cognitive Status: No family/caregiver present to determine baseline cognitive functioning                                 General Comments: Decreased safety awareness.        General Comments      Exercises     Assessment/Plan    PT Assessment Patient needs continued PT services  PT Problem List Decreased strength;Decreased activity tolerance;Decreased balance;Decreased mobility;Decreased knowledge of use of DME;Decreased knowledge of precautions;Decreased safety awareness       PT Treatment Interventions Therapeutic activities;Gait training;Therapeutic exercise;Patient/family education;Functional mobility training;Balance training;Cognitive remediation;DME instruction    PT Goals (Current goals can be found in the Care Plan section)  Acute Rehab PT Goals Patient Stated Goal: to go home PT Goal Formulation: With patient Time For Goal Achievement: 11/04/21 Potential to Achieve Goals: Fair    Frequency Min 3X/week   Barriers to discharge Decreased caregiver support      Co-evaluation PT/OT/SLP Co-Evaluation/Treatment: Yes Reason for Co-Treatment: For patient/therapist safety;To address functional/ADL transfers PT goals addressed during session: Mobility/safety with  mobility;Balance         AM-PAC PT "6 Clicks" Mobility  Outcome Measure Help needed turning from your back to your side while in a flat bed without using bedrails?: A Little Help needed moving from lying on your back to sitting on the side of a flat bed without using bedrails?: A Little Help needed moving to and from a bed to a chair (including a wheelchair)?: Total Help needed standing up from a chair using your arms (e.g., wheelchair or bedside chair)?: Total Help needed to walk in hospital room?: Total Help needed climbing 3-5 steps with a railing? : Total 6 Click Score: 10    End of Session Equipment Utilized During Treatment: Gait belt Activity Tolerance: Patient limited by fatigue Patient left: in chair (in chair in hallway in ED at nurses station) Nurse Communication: Mobility status PT Visit Diagnosis: Unsteadiness on feet (R26.81);Muscle weakness (generalized) (M62.81);History of falling (Z91.81);Repeated falls (R29.6);Difficulty in walking, not elsewhere classified (R26.2)    Time: 7672-0947 PT Time Calculation (min) (ACUTE ONLY): 36 min   Charges:   PT Evaluation $PT Eval Moderate Complexity: 1 Mod          Reuel Derby, PT, DPT  Acute Rehabilitation Services  Pager: [phone] Office: [phone]   Rudean Hitt 10/21/2021, 1:44 PM

## 2021-10-21 NOTE — Evaluation (Signed)
Occupational Therapy Evaluation Patient Details Name: Tammy Moses MRN: [id number] DOB: [birth year] Today's Date: 10/21/2021   History of Present Illness Pt is a 55 y/o female presenting to the ED on 12/28 secondary to fall. Found to have R patellar fx. PMH includes ESRD on HD, HTN, DM, COPD, blindness, tobacco abuse.   Clinical Impression   Pt admitted with above. She demonstrates the below listed deficits and will benefit from continued OT to maximize safety and independence with BADLs.  Pt presents to OT with generalized weakness, decreased activity tolerance, impaired balance, impaired cognition, and significant visual impairment as well as decreased knowledge of precautions.  Pt currently requires set up - min A for UB ADLs, and max - total A for LB ADLs.  She requires mod A +2 for functional mobility.  Pt reports she lives alone with PCA 3hours/day.  She was unable to provide detail re: amount of assist she required PTA.  Feel pt will need SNF level rehab at discharge.        Recommendations for follow up therapy are one component of a multi-disciplinary discharge planning process, led by the attending physician.  Recommendations .hemay be updated based on patient status, additional functional criteria and insurance authorization.   Follow Up Recommendations  Skilled nursing-short term rehab (<3 hours/day)    Assistance Recommended at Discharge Frequent or constant Supervision/Assistance  Functional Status Assessment  Patient has had a recent decline in their functional status and demonstrates the ability to make significant improvements in function in a reasonable and predictable amount of time.  Equipment Recommendations  BSC/3in1    Recommendations for Other Services       Precautions / Restrictions Precautions Precautions: Fall Precaution Comments: blind, R foot drop Restrictions Weight Bearing Restrictions: Yes RLE Weight Bearing: Non weight bearing      Mobility Bed  Mobility               General bed mobility comments: In recliner in ED    Transfers Overall transfer level: Needs assistance Equipment used: Rolling walker (2 wheels) Transfers: Sit to/from Stand Sit to Stand: Mod assist;+2 physical assistance           General transfer comment: Required mod A for lift assist and steadying to stand. Had increased difficulty as she fatigued.      Balance Overall balance assessment: Needs assistance Sitting-balance support: No upper extremity supported;Feet supported Sitting balance-Leahy Scale: Fair     Standing balance support: Bilateral upper extremity supported;During functional activity Standing balance-Leahy Scale: Poor Standing balance comment: Reliant on BUE support                           ADL either performed or assessed with clinical judgement   ADL Overall ADL's : Needs assistance/impaired Eating/Feeding: Set up;Supervision/ safety;Sitting   Grooming: Wash/dry hands;Wash/dry face;Oral care;Brushing hair;Set up;Supervision/safety;Sitting   Upper Body Bathing: Set up;Supervision/ safety;Sitting   Lower Body Bathing: Moderate assistance;Sit to/from stand Lower Body Bathing Details (indicate cue type and reason): assist for peri care as pt unable to unweight UE from RW to access peri area.  Requires assist for Rt LE Upper Body Dressing : Minimal assistance;Sitting   Lower Body Dressing: Sit to/from stand;Total assistance   Toilet Transfer: Moderate assistance;+2 for physical assistance;+2 for safety/equipment;Rolling walker (2 wheels)   Toileting- Clothing Manipulation and Hygiene: Maximal assistance;Sit to/from stand       Functional mobility during ADLs: Moderate assistance;+2 for physical assistance;+2 for  safety/equipment;Rolling walker (2 wheels)       Vision Baseline Vision/History: 2 Legally blind Ability to See in Adequate Light: 3 Highly impaired Patient Visual Report: No change from  baseline Vision Assessment?: Vision impaired- to be further tested in functional context Additional Comments: pt unable to see therapist's hand in front of her face     Perception     Praxis      Pertinent Vitals/Pain Pain Assessment: Faces Faces Pain Scale: Hurts even more Pain Location: R knee Pain Descriptors / Indicators: Grimacing;Guarding Pain Intervention(s): Monitored during session;Limited activity within patient's tolerance;Repositioned     Hand Dominance Right   Extremity/Trunk Assessment Upper Extremity Assessment Upper Extremity Assessment: Overall WFL for tasks assessed   Lower Extremity Assessment Lower Extremity Assessment: Defer to PT evaluation RLE Deficits / Details: R foot drop at baseline. Pt in [place] throughout LLE Deficits / Details: Jerking type movements in LLE during stance phase   Cervical / Trunk Assessment Cervical / Trunk Assessment: Normal   Communication Communication Communication: No difficulties   Cognition Arousal/Alertness: Awake/alert Behavior During Therapy: WFL for tasks assessed/performed Overall Cognitive Status: No family/caregiver present to determine baseline cognitive functioning                                 General Comments: Decreased safety awareness.     General Comments  Pt instructed to keep KI in place and limit knee flexion    Exercises     Shoulder Instructions      Home Living Family/patient expects to be discharged to:: Private residence Living Arrangements: Alone Available Help at Discharge: Personal care attendant;Available PRN/intermittently Type of Home: Apartment Home Access: Level entry     Home Layout: One level     Bathroom Shower/Tub: Teacher, early years/pre: Standard     Home Equipment: Rollator (4 wheels);Cane - single point;Toilet riser;Wheelchair - manual          Prior Functioning/Environment Prior Level of Function : History of Falls (last six  months);Needs assist             Mobility Comments: Multiple falls. Uses WC occasionally inside; uses rollator mostly inside. ADLs Comments: Aide comes 2 hours/day        OT Problem List: Decreased strength;Decreased activity tolerance;Impaired balance (sitting and/or standing);Decreased safety awareness;Decreased knowledge of use of DME or AE;Decreased knowledge of precautions;Pain      OT Treatment/Interventions: Self-care/ADL training;Therapeutic exercise;Energy conservation;DME and/or AE instruction;Therapeutic activities;Cognitive remediation/compensation;Patient/family education;Balance training    OT Goals(Current goals can be found in the care plan section) Acute Rehab OT Goals Patient Stated Goal: to be able to take care of self OT Goal Formulation: With patient Time For Goal Achievement: 11/04/21 Potential to Achieve Goals: Good ADL Goals Pt Will Perform Upper Body Bathing: with set-up;with supervision;sitting Pt Will Perform Upper Body Dressing: with supervision;with set-up;sitting Pt Will Perform Lower Body Dressing: with min assist;sit to/from stand Pt Will Transfer to Toilet: with min assist;stand pivot transfer;bedside commode Pt Will Perform Toileting - Clothing Manipulation and hygiene: with min assist;sit to/from stand  OT Frequency: Min 2X/week   Barriers to D/C: Decreased caregiver support          Co-evaluation PT/OT/SLP Co-Evaluation/Treatment: Yes Reason for Co-Treatment: For patient/therapist safety;To address functional/ADL transfers PT goals addressed during session: Mobility/safety with mobility;Balance OT goals addressed during session: ADL's and self-care      AM-PAC OT "6 Clicks" Daily Activity  Outcome Measure Help from another person eating meals?: None Help from another person taking care of personal grooming?: A Little Help from another person toileting, which includes using toliet, bedpan, or urinal?: A Lot Help from another person  bathing (including washing, rinsing, drying)?: A Lot Help from another person to put on and taking off regular upper body clothing?: A Little Help from another person to put on and taking off regular lower body clothing?: Total 6 Click Score: 15   End of Session Equipment Utilized During Treatment: Rolling walker (2 wheels);Gait belt;Right knee immobilizer Nurse Communication: Mobility status;Precautions;Weight bearing status  Activity Tolerance: Patient tolerated treatment well Patient left: in chair (in ED)  OT Visit Diagnosis: Unsteadiness on feet (R26.81);Muscle weakness (generalized) (M62.81);Low vision, both eyes (H54.2)                Time: 7841-2820 OT Time Calculation (min): 36 min Charges:  OT General Charges $OT Visit: 1 Visit OT Evaluation $OT Eval Moderate Complexity: 1 Mod  Nilsa Nutting., OTR/L Acute Rehabilitation Services Pager [phone] Office [phone]   Lucille Passy M 10/21/2021, 2:44 PM

## 2021-10-21 NOTE — ED Triage Notes (Addendum)
To ED via [facility] for eval of knee pain since falling on ice yesterday after dialysis. Also, complains of right toe pain. Pt states she has not been able to walk since falling yesterday due to pain. States her boyfriend noticed her toe yesterday. States states her toe had blister last Wednesday. Toe is black with an outline of red. Pt with feeling in toe. No foul odor. No drainage.

## 2021-10-21 NOTE — Discharge Planning (Signed)
Pt currently active with [facility] for Home Health services as confirmed by [facility] with [facility].  Pt will resume [facility] services of RN/PT if disposition is home with home health.  DME needs identified at this time include knee immobilizer and crutches.

## 2021-10-21 NOTE — ED Provider Notes (Signed)
[facility] EMERGENCY DEPARTMENT Provider Note   CSN: [id number] Arrival date & time: 10/21/21  0744     History Chief Complaint  Patient presents with   Knee Pain    Tammy Moses is a 55 y.o. 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  The patient reports that she just left skilled nursing facility rehab yesterday, after discharge because "my insurance ran out".  She had been discharged there on December 16 after hospitalization stay for volume overload and acute hypoxic respiratory failure felt to be secondary to medical noncompliance and missed dialysis.  She was started on dialysis in the ED. patient ports compliance with all of her dialysis sessions since going to rehab, including yesterday.  She said that after leaving dialysis she slipped on the ice and landed on her right knee.  She has not been able to bear weight since then.  She poor significant pain in her right knee.  Her partner at home also noted that her right toe is discolored, she said it has been discolored for about a week.  She denies any significant pain in her toe.  She reports that she is getting home health, 5 to 7 hours/day, but feels that she needs more persistent home health.  Her family is not able to assist her at home.  HPI     Past Medical History:  Diagnosis Date   Anemia    Anxiety    Blind right eye    Chronic kidney disease, stage V ([place])    Constipation, chronic    Dental caries    Diabetes mellitus    Diabetic neuropathy ([place])    Diabetic retinopathy    GERD (gastroesophageal reflux disease)    Glaucoma    H. pylori infection    Hepatitis C carrier ([place])    High risk sexual behavior    Hyperlipidemia     Hypertension    Hypoglycemia 07/12/2017   Insomnia    Microalbuminuria    Nonspecific elevation of levels of transaminase or lactic acid dehydrogenase (LDH)    Tobacco dependence    Vitamin D deficiency     Patient Active Problem List   Diagnosis Date Noted   Malignant hypertension 09/30/2021   Flash pulmonary edema ([place]) 09/30/2021   Non compliance with medical treatment 09/30/2021   Foot pain, right 09/22/2021   Elevated troponin 09/16/2021   Anemia due to chronic kidney disease 09/16/2021   Impaired functional mobility and activity tolerance 01/04/2019   Pneumonia of both lungs due to infectious organism 10/05/2017   Fall    Anemia 07/12/2017   COPD (chronic obstructive pulmonary disease) ([place]) 06/24/2017   ESRD (end stage renal disease) ([place]) -  Dialyzes at [facility].  On Monday Wednesday Friday. 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[place]) 05/10/2017   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   Vitamin D deficiency 04/07/2017   Absolute glaucoma of right eye 12/27/2016   Chronic angle-closure glaucoma of left eye, severe stage 12/27/2016   PCO (posterior capsule opacification), left 12/27/2016  Gait abnormality 03/07/2014   Abnormality of gait 03/07/2014   Hepatitis C virus infection without hepatic coma 03/04/2014   History of hepatitis C 03/04/2014   Erosive gastritis 02/14/2014   Pseudophakia of left eye 10/30/2012   Asthma 10/13/2012   Reflux 10/13/2012   Lens replaced by other means 10/12/2012   Primary open angle glaucoma 10/12/2012   BACK PAIN, LUMBAR 08/18/2010   IDDM 08/11/2010   HYPERCHOLESTEROLEMIA 08/11/2010   HYPOKALEMIA 08/11/2010   DEPRESSION 08/11/2010   GLAUCOMA 08/11/2010   BLINDNESS, RIGHT EYE 08/11/2010    Essential hypertension 08/11/2010   GERD 08/11/2010   CONSTIPATION 08/06/2009    Past Surgical History:  Procedure Laterality Date   ABDOMINAL HYSTERECTOMY  04/30/2009   PARTIAL   COLONOSCOPY     DIALYSIS FISTULA CREATION Left 06/2017   ESOPHAGOGASTRODUODENOSCOPY     IR AV DIALY SHUNT INTRO NEEDLE/INTRACATH INITIAL W/PTA/IMG RIGHT Right 10/07/2021   IR FLUORO GUIDE CV LINE RIGHT  10/06/2021   IR US GUIDE VASC ACCESS RIGHT  10/06/2021   IR US GUIDE VASC ACCESS RIGHT  10/08/2021     OB History   No obstetric history on file.     Family History  Problem Relation Age of Onset   High blood pressure Mother    Diabetes Mother    Thyroid disease Mother    Diabetes Father    High blood pressure Father    Cerebral palsy Daughter    Other Son        still born    Social History   Tobacco Use   Smoking status: Every Day    Packs/day: 0.25    Types: Cigarettes   Smokeless tobacco: Never   Tobacco comments:    trying to quit  Vaping Use   Vaping Use: Never used  Substance Use Topics   Alcohol use: Yes    Comment: 2-3 beers weekly   Drug use: Not Currently    Types: Marijuana, Cocaine    Comment: quit in 2018    Home Medications Prior to Admission medications   Medication Sig Start Date End Date Taking? Authorizing Provider  acetaminophen (TYLENOL) 500 MG tablet Take 1,000 mg by mouth every 6 (six) hours as needed for mild pain.    [provider]  albuterol (VENTOLIN HFA) 108 (90 Base) MCG/ACT inhaler Inhale 2 puffs into the lungs every 4 (four) hours as needed for shortness of breath. For shortness of breath 12/11/19   [name], FNP  amLODipine (NORVASC) 10 MG tablet TAKE 1 TABLET(10 MG) BY MOUTH DAILY Patient taking differently: Take 10 mg by mouth daily. 02/10/21   [name], FNP  atropine 1 % ophthalmic solution Place 1 drop into both eyes 2 (two) times daily.    [provider]  blood glucose meter kit and supplies KIT Dispense  based on patient and insurance preference. Use up to four times daily as directed. (FOR ICD-10 E.11.9) 11/30/17   Scot Jun, FNP  buPROPion (ZYBAN) 150 MG 12 hr tablet Take 150 mg by mouth daily. 01/16/21   [provider]  calcium acetate (PHOSLO) 667 MG capsule Take 2,001 mg by mouth 3 (three) times daily with meals. 07/06/18   [provider]  diltiazem 2 % GEL Apply 1 application topically 5 (five) times daily. 06/19/21   Irene Shipper, MD  dorzolamide-timolol (COSOPT) 22.3-6.8 MG/ML ophthalmic solution Place 1 drop into both eyes 2 (two) times daily. 12/08/20   [provider]  furosemide (  LASIX) 40 MG tablet Take 1 tablet (40 mg total) by mouth daily. 12/11/19   [name], FNP  gabapentin (NEURONTIN) 600 MG tablet Take 1 tablet (600 mg total) by mouth 2 (two) times daily. 12/11/19   [name], FNP  glucose blood (ONE TOUCH ULTRA TEST) test strip USE TO TEST 3 TIMES A DAY 08/15/20   [name], FNP  hydrALAZINE (APRESOLINE) 100 MG tablet Take 1 tablet (100 mg total) by mouth every 8 (eight) hours. 10/09/21   Shelly Coss, MD  Insulin Pen Needle 29G X 12MM MISC E11.9 Dx Code Use once at bedtime with each nightly insulin 03/11/17   Scot Jun, FNP  lamoTRIgine (LAMICTAL) 25 MG tablet Take 25 mg by mouth daily. 01/16/21   [provider]  Lancets Glory Rosebush ULTRASOFT) lancets Use as instructed 07/10/19   [name], FNP  latanoprost (XALATAN) 0.005 % ophthalmic solution Place 1 drop into both eyes at bedtime. 12/16/20   [provider]  metoCLOPramide (REGLAN) 10 MG tablet Take 10 mg by mouth 2 (two) times daily as needed for nausea. 09/22/21   [provider]  pravastatin (PRAVACHOL) 80 MG tablet Take 1 tablet (80 mg total) by mouth daily. 12/11/19   [name], FNP    Allergies    Acyclovir and related  Review of Systems   Review of Systems  Constitutional:  Negative for chills and fever.   Eyes:  Negative for pain and visual disturbance.  Respiratory:  Negative for cough and shortness of breath.   Cardiovascular:  Negative for chest pain and palpitations.  Gastrointestinal:  Negative for abdominal pain and vomiting.  Genitourinary:  Negative for dysuria and hematuria.  Musculoskeletal:  Positive for arthralgias and myalgias.  Skin:  Positive for rash. Negative for color change.  Neurological:  Negative for syncope and headaches.  All other systems reviewed and are negative.  Physical Exam Updated Vital Signs BP (!) 211/109 (BP Location: Right Arm)    Pulse 92    Temp 99.7 F (37.6 C) (Oral)    Resp 14    SpO2 94%   Physical Exam Constitutional:      General: She is not in acute distress.    Comments: Blind right eye  HENT:     Head: Normocephalic and atraumatic.  Eyes:     Conjunctiva/sclera: Conjunctivae normal.     Pupils: Pupils are equal, round, and reactive to light.  Cardiovascular:     Rate and Rhythm: Normal rate and regular rhythm.  Pulmonary:     Effort: Pulmonary effort is normal. No respiratory distress.  Musculoskeletal:     Comments: Patella ttp right leg Minor patellar effusion Right toe discolored (dorsal surface only), sensation intact, no open wounds Right foot feels warm, no discoloration of remaining toes or foot  Skin:    General: Skin is warm and dry.  Neurological:     General: No focal deficit present.     Mental Status: She is alert. Mental status is at baseline.  Psychiatric:        Mood and Affect: Mood normal.        Behavior: Behavior normal.    ED Results / Procedures / Treatments   Labs (all labs ordered are listed, but only abnormal results are displayed) Labs Reviewed  BASIC METABOLIC PANEL - Abnormal; Notable for the following components:      Result Value   Glucose, Bld 106 (*)    BUN 49 (*)  Creatinine, Ser 9.29 (*)    GFR, Estimated 5 (*)    All other components within normal limits  CBC WITH  DIFFERENTIAL/PLATELET - Abnormal; Notable for the following components:   WBC 11.3 (*)    RBC 3.16 (*)    Hemoglobin 8.9 (*)    HCT 29.1 (*)    RDW 16.3 (*)    Platelets 507 (*)    Monocytes Absolute 1.5 (*)    All other components within normal limits  CBG MONITORING, ED - Abnormal; Notable for the following components:   Glucose-Capillary 127 (*)    All other components within normal limits  RESP PANEL BY RT-PCR (FLU A&B, COVID) [id number]  PROTIME-INR    EKG None  Radiology DG Knee Complete 4 Views Right  Result Date: 10/21/2021 CLINICAL DATA:  Knee pain after a fall yesterday. EXAM: RIGHT KNEE - COMPLETE 4+ VIEW COMPARISON:  None. FINDINGS: There is a nondisplaced fracture of the lower pole of the patella. There is mild overlying soft tissue swelling. There is no dislocation, and no sizable knee joint effusion is evident. Femorotibial joint space widths are preserved. Extensive atherosclerotic vascular calcifications are noted. IMPRESSION: Nondisplaced patellar fracture. Electronically Signed   By: Logan Bores M.D.   On: 10/21/2021 08:32   DG Foot Complete Right  Result Date: 10/21/2021 CLINICAL DATA:  Fall yesterday.  Toe pain and discoloration. EXAM: RIGHT FOOT COMPLETE - 3+ VIEW COMPARISON:  09/22/2021 FINDINGS: No acute fracture or dislocation is identified. No osseous erosion is evident. Extensive atherosclerotic vascular calcifications are again noted. IMPRESSION: No acute osseous abnormality identified. Electronically Signed   By: Logan Bores M.D.   On: 10/21/2021 08:35    Procedures Procedures   Medications Ordered in ED Medications  hydrALAZINE (APRESOLINE) tablet 100 mg (100 mg Oral Given 10/21/21 1121)  gabapentin (NEURONTIN) capsule 600 mg (600 mg Oral Given 10/21/21 1121)  acetaminophen (TYLENOL) tablet 650 mg (650 mg Oral Given 10/21/21 1120)    ED Course  I have reviewed the triage vital signs and the nursing notes.  Pertinent labs & imaging results that  were available during my care of the patient were reviewed by me and considered in my medical decision making (see chart for details).  Patient is presenting here with a mechanical fall and isolated, nondisplaced right patellar fracture per my review and interpretation of her x-ray films.  She is neurovascularly intact.  We will place her leg in a knee immobilizer.  It is not clear to me if she has a strength to use crutches, but she does not think so.  Her home pain medications are ordered, gabapentin and Tylenol.  Also hydralazine which she takes 3 times a day for hypertension.  Hemodialysis respiratory standpoint, she appears stable, reports she has not missed any sessions.  No evidence of pulmonary edema or hypoxia or respiratory distress on this presentation.  Her right toe has some partial discoloration, which be more consistent with a vascular cause or micro emboli than infection/necrosis.  No open underlying wound.  X-ray of the foot does not show any obvious signs of osteomyelitis.  The foot does have pedal pulses that are present.    I discussed case with Ortho by phone as noted below.  I reviewed her prior medical records including her hospital discharge from recent hospitalization.  Clinical Course as of 10/21/21 1518  Wed Oct 21, 2021  1115 I spoke to [facility] ortho who agreed with knee immobilizer and crutches, NWB if tolerated and  outpt f/u for patella fx.  Toe may be concerning for ischemia given only partial discoloration.  He recommended ABI which is ordered. [MT]  1123 Stat PT eval ordered for stability with crutches [MT]  1254 Patient unable to ambulate safely with crutches per PT/OT assessment. [MT]  1516 Admitted to Dr Roosevelt Locks [MT]    Clinical Course User Index [MT] Langston Masker Carola Rhine, MD     Final Clinical Impression(s) / ED Diagnoses Final diagnoses:  Closed nondisplaced fracture of right patella, unspecified fracture morphology, initial encounter  Discoloration  of skin of toe    Rx / DC Orders ED Discharge Orders     None        Wyvonnia Dusky, MD 10/21/21 [phone]

## 2021-10-21 NOTE — Progress Notes (Signed)
Orthopedic Tech Progress Note Patient Details:  JAMAL HASKIN [birth year] [id number]  I stated to MD I was not comfortable with patient using crutches do to how patient couldn't see the crutches let alone be taught how to used the crutches, MD was nonchalant with what was going on as well as of my concerns with patient, OT witness how patient was trying to just STAND with crutches and it was not working asked if MD would have OT/PT work with patient  Ortho Devices Type of Ortho Device: Knee Immobilizer Ortho Device/Splint Location: RLE Ortho Device/Splint Interventions: Ordered, Application, Adjustment   Post Interventions Patient Tolerated: Fair, Well Instructions Provided: Care of device  Janit Pagan 10/21/2021, 11:48 AM

## 2021-10-21 NOTE — Consult Note (Signed)
Reason for Consult: To manage dialysis and dialysis related needs Referring Physician: [name] is an 55 y.o. female with DM, HTN, COPD as well as ESRD-  poor social situation-  non compliant with medical treatments and multiple hospitalizations of late-  just discharged on 12/16 actually to [facility] when her HD treatment center changed from [facility] on MWF to [facility] on TTS.  She says she has been going to HD- went last on 12/27 - yest-  but then was discharged from [facility] due to insurance issues-  when attempting to walk to her apt- she fell and suffered a patellar fx-  she is placed under obs status-  I think placement is to be attempted again and patient is OK with that.  We are asked to provide her routine HD-  will be due tomorrow-  she looks pretty good tonight    Had been at [facility] MWF but after hospt most recently discharged to [facility] and was getting HD at [facility] on TTS-  did go on 12/27  Past Medical History:  Diagnosis Date   Anemia    Anxiety    Blind right eye    Chronic kidney disease, stage V (HCC)    Constipation, chronic    Dental caries    Diabetes mellitus    Diabetic neuropathy (HCC)    Diabetic retinopathy    GERD (gastroesophageal reflux disease)    Glaucoma    H. pylori infection    Hepatitis C carrier ([place])    High risk sexual behavior    Hyperlipidemia    Hypertension    Hypoglycemia 07/12/2017   Insomnia    Microalbuminuria    Nonspecific elevation of levels of transaminase or lactic acid dehydrogenase (LDH)    Tobacco dependence    Vitamin D deficiency     Past Surgical History:  Procedure Laterality Date   ABDOMINAL HYSTERECTOMY  04/30/2009   PARTIAL   COLONOSCOPY     DIALYSIS FISTULA CREATION Left 06/2017   ESOPHAGOGASTRODUODENOSCOPY     IR AV DIALY SHUNT INTRO NEEDLE/INTRACATH INITIAL W/PTA/IMG RIGHT Right 10/07/2021   IR FLUORO GUIDE CV LINE RIGHT  10/06/2021   IR US GUIDE VASC ACCESS RIGHT   10/06/2021   IR US GUIDE VASC ACCESS RIGHT  10/08/2021    Family History  Problem Relation Age of Onset   High blood pressure Mother    Diabetes Mother    Thyroid disease Mother    Diabetes Father    High blood pressure Father    Cerebral palsy Daughter    Other Son        still born    Social History:  reports that she has been smoking cigarettes. She has been smoking an average of .25 packs per day. She has never used smokeless tobacco. She reports current alcohol use. She reports that she does not currently use drugs after having used the following drugs: Marijuana and Cocaine.  Allergies:  Allergies  Allergen Reactions   Acyclovir And Related Itching    Medications: I have reviewed the patient's current medications.  Results for orders placed or performed during the hospital encounter of 10/21/21 (from the past 48 hour(s))  POC CBG, ED     Status: Abnormal   Collection Time: 10/21/21  8:05 AM  Result Value Ref Range   Glucose-Capillary 127 (H) 70 - 99 mg/dL    Comment: Glucose reference range applies only to samples taken after fasting for at  least 8 hours.  Basic metabolic panel     Status: Abnormal   Collection Time: 10/21/21  2:25 PM  Result Value Ref Range   Sodium 137 135 - 145 mmol/L   Potassium 4.5 3.5 - 5.1 mmol/L   Chloride 98 98 - 111 mmol/L   CO2 24 22 - 32 mmol/L   Glucose, Bld 106 (H) 70 - 99 mg/dL    Comment: Glucose reference range applies only to samples taken after fasting for at least 8 hours.   BUN 49 (H) 6 - 20 mg/dL   Creatinine, Ser 9.29 (H) 0.44 - 1.00 mg/dL   Calcium 9.4 8.9 - 10.3 mg/dL   GFR, Estimated 5 (L) >60 mL/min    Comment: (NOTE) Calculated using the CKD-EPI Creatinine Equation (2021)    Anion gap 15 5 - 15    Comment: Performed at [facility] [address] [place], [place] [address]  CBC with Differential     Status: Abnormal   Collection Time: 10/21/21  2:25 PM  Result Value Ref Range   WBC 11.3 (H) 4.0 - 10.5 K/uL    RBC 3.16 (L) 3.87 - 5.11 MIL/uL   Hemoglobin 8.9 (L) 12.0 - 15.0 g/dL   HCT 29.1 (L) 36.0 - 46.0 %   MCV 92.1 80.0 - 100.0 fL   MCH 28.2 26.0 - 34.0 pg   MCHC 30.6 30.0 - 36.0 g/dL   RDW 16.3 (H) 11.5 - 15.5 %   Platelets 507 (H) 150 - 400 K/uL   nRBC 0.0 0.0 - 0.2 %   Neutrophils Relative % 67 %   Neutro Abs 7.6 1.7 - 7.7 K/uL   Lymphocytes Relative 17 %   Lymphs Abs 1.9 0.7 - 4.0 K/uL   Monocytes Relative 13 %   Monocytes Absolute 1.5 (H) 0.1 - 1.0 K/uL   Eosinophils Relative 1 %   Eosinophils Absolute 0.1 0.0 - 0.5 K/uL   Basophils Relative 1 %   Basophils Absolute 0.1 0.0 - 0.1 K/uL   Immature Granulocytes 1 %   Abs Immature Granulocytes 0.06 0.00 - 0.07 K/uL    Comment: Performed at [facility], [place] [address] [place], [place] [address]  Protime-INR     Status: None   Collection Time: 10/21/21  2:25 PM  Result Value Ref Range   Prothrombin Time 14.9 11.4 - 15.2 seconds   INR 1.2 0.8 - 1.2    Comment: (NOTE) INR goal varies based on device and disease states. Performed at [facility], [place] [address], [place], [place] [address]   Resp Panel by RT-PCR (Flu A&B, Covid) Nasopharyngeal Swab     Status: None   Collection Time: 10/21/21  2:51 PM   Specimen: Nasopharyngeal Swab; Nasopharyngeal(NP) swabs in vial transport medium  Result Value Ref Range   SARS Coronavirus 2 by RT PCR NEGATIVE NEGATIVE    Comment: (NOTE) SARS-CoV-2 target nucleic acids are NOT DETECTED.  The SARS-CoV-2 RNA is generally detectable in upper respiratory specimens during the acute phase of infection. The lowest concentration of SARS-CoV-2 viral copies this assay can detect is 138 copies/mL. A negative result does not preclude SARS-Cov-2 infection and should not be used as the sole basis for treatment or other patient management decisions. A negative result may occur with  improper specimen collection/handling, submission of specimen other than nasopharyngeal swab, presence of  viral mutation(s) within the areas targeted by this assay, and inadequate number of viral copies(<138 copies/mL). A negative  result must be combined with clinical observations, patient history, and epidemiological information. The expected result is Negative.  Fact Sheet for Patients:  EntrepreneurPulse.com.au  Fact Sheet for Healthcare Providers:  IncredibleEmployment.be  This test is no t yet approved or cleared by the [place] FDA and  has been authorized for detection and/or diagnosis of SARS-CoV-2 by FDA under an Emergency Use Authorization (EUA). This EUA will remain  in effect (meaning this test can be used) for the duration of the COVID-19 declaration under Section 564(b)(1) of the Act, 21 U.S.C.section 360bbb-3(b)(1), unless the authorization is terminated  or revoked sooner.       Influenza A by PCR NEGATIVE NEGATIVE   Influenza B by PCR NEGATIVE NEGATIVE    Comment: (NOTE) The Xpert Xpress SARS-CoV-2/FLU/RSV plus assay is intended as an aid in the diagnosis of influenza from Nasopharyngeal swab specimens and should not be used as a sole basis for treatment. Nasal washings and aspirates are unacceptable for Xpert Xpress SARS-CoV-2/FLU/RSV testing.  Fact Sheet for Patients: EntrepreneurPulse.com.au  Fact Sheet for Healthcare Providers: IncredibleEmployment.be  This test is not yet approved or cleared by the [place] FDA and has been authorized for detection and/or diagnosis of SARS-CoV-2 by FDA under an Emergency Use Authorization (EUA). This EUA will remain in effect (meaning this test can be used) for the duration of the COVID-19 declaration under Section 564(b)(1) of the Act, 21 U.S.C. section 360bbb-3(b)(1), unless the authorization is terminated or revoked.  Performed at [facility], [place] [address], [place], [place] [address]   CBG monitoring, ED     Status: None    Collection Time: 10/21/21  5:45 PM  Result Value Ref Range   Glucose-Capillary 72 70 - 99 mg/dL    Comment: Glucose reference range applies only to samples taken after fasting for at least 8 hours.    DG Knee Complete 4 Views Right  Result Date: 10/21/2021 CLINICAL DATA:  Knee pain after a fall yesterday. EXAM: RIGHT KNEE - COMPLETE 4+ VIEW COMPARISON:  None. FINDINGS: There is a nondisplaced fracture of the lower pole of the patella. There is mild overlying soft tissue swelling. There is no dislocation, and no sizable knee joint effusion is evident. Femorotibial joint space widths are preserved. Extensive atherosclerotic vascular calcifications are noted. IMPRESSION: Nondisplaced patellar fracture. Electronically Signed   By: Logan Bores M.D.   On: 10/21/2021 08:32   DG Foot Complete Right  Result Date: 10/21/2021 CLINICAL DATA:  Fall yesterday.  Toe pain and discoloration. EXAM: RIGHT FOOT COMPLETE - 3+ VIEW COMPARISON:  09/22/2021 FINDINGS: No acute fracture or dislocation is identified. No osseous erosion is evident. Extensive atherosclerotic vascular calcifications are again noted. IMPRESSION: No acute osseous abnormality identified. Electronically Signed   By: Logan Bores M.D.   On: 10/21/2021 08:35    ROS: right knee pain  Blood pressure (!) 201/68, pulse 68, temperature 97.9 F (36.6 C), temperature source Oral, resp. rate 18, SpO2 96 %. General appearance: alert, cooperative, and has a blind eye Resp: clear to auscultation bilaterally Cardio: regular rate and rhythm, S1, S2 normal, no murmur, click, rub or gallop GI: soft, non-tender; bowel sounds normal; no masses,  no organomegaly Extremities: no edema-  right knee in immobilizer Left AVF patent  Assessment/Plan: 55 year old BF with ESRD along with other medical issues as well as social ones-  now with patellar fx-  just discharged from SNF 1 patellar fx-  in immobilizer-  not sure if any other  intervention required 2 ESRD:  has been lately TTS at [facility].  Not sure where will be going s/p this hosp- will plan on HD tomorrow  3 Hypertension/vol: BP high, could be some pain related - as OP on hydralazine and amlodipine-  has been ordered here-  does not appear too volume overloaded but may challenge tomorrow with HD 4. Anemia of ESRD: hgb 8.9-  will add on ESA and check iron stores 5. Metabolic Bone Disease: continue phoslo-  will check calcium and PTH to determine what other meds are needed-  anticipate will be starting from scratch at a new HD unit upon discharge   Louis Meckel 10/21/2021, 7:17 PM

## 2021-10-22 ENCOUNTER — Inpatient Hospital Stay (HOSPITAL_COMMUNITY): Payer: Medicare Other

## 2021-10-22 ENCOUNTER — Observation Stay (HOSPITAL_COMMUNITY): Payer: Medicare Other

## 2021-10-22 DIAGNOSIS — Z992 Dependence on renal dialysis: Secondary | ICD-10-CM

## 2021-10-22 DIAGNOSIS — L0390 Cellulitis, unspecified: Secondary | ICD-10-CM

## 2021-10-22 DIAGNOSIS — J449 Chronic obstructive pulmonary disease, unspecified: Secondary | ICD-10-CM

## 2021-10-22 DIAGNOSIS — Z79899 Other long term (current) drug therapy: Secondary | ICD-10-CM

## 2021-10-22 DIAGNOSIS — E1152 Type 2 diabetes mellitus with diabetic peripheral angiopathy with gangrene: Secondary | ICD-10-CM

## 2021-10-22 DIAGNOSIS — Z9115 Patient's noncompliance with renal dialysis: Secondary | ICD-10-CM

## 2021-10-22 DIAGNOSIS — L819 Disorder of pigmentation, unspecified: Secondary | ICD-10-CM

## 2021-10-22 DIAGNOSIS — Z794 Long term (current) use of insulin: Secondary | ICD-10-CM

## 2021-10-22 DIAGNOSIS — S82091A Other fracture of right patella, initial encounter for closed fracture: Secondary | ICD-10-CM | POA: Diagnosis not present

## 2021-10-22 DIAGNOSIS — S82001A Unspecified fracture of right patella, initial encounter for closed fracture: Secondary | ICD-10-CM | POA: Diagnosis not present

## 2021-10-22 DIAGNOSIS — E1122 Type 2 diabetes mellitus with diabetic chronic kidney disease: Secondary | ICD-10-CM

## 2021-10-22 DIAGNOSIS — I120 Hypertensive chronic kidney disease with stage 5 chronic kidney disease or end stage renal disease: Secondary | ICD-10-CM

## 2021-10-22 DIAGNOSIS — F17210 Nicotine dependence, cigarettes, uncomplicated: Secondary | ICD-10-CM

## 2021-10-22 DIAGNOSIS — N186 End stage renal disease: Secondary | ICD-10-CM

## 2021-10-22 LAB — GLUCOSE, CAPILLARY
Glucose-Capillary: 96 mg/dL (ref 70–99)
Glucose-Capillary: 106 mg/dL — ABNORMAL HIGH (ref 70–99)
Glucose-Capillary: 109 mg/dL — ABNORMAL HIGH (ref 70–99)
Glucose-Capillary: 150 mg/dL — ABNORMAL HIGH (ref 70–99)
Glucose-Capillary: 134 mg/dL — ABNORMAL HIGH (ref 70–99)

## 2021-10-22 LAB — BASIC METABOLIC PANEL
Anion gap: 15 (ref 5–15)
BUN: 53 mg/dL — ABNORMAL HIGH (ref 6–20)
CO2: 20 mmol/L — ABNORMAL LOW (ref 22–32)
Calcium: 8.7 mg/dL — ABNORMAL LOW (ref 8.9–10.3)
Chloride: 99 mmol/L (ref 98–111)
Creatinine, Ser: 10.16 mg/dL — ABNORMAL HIGH (ref 0.44–1.00)
GFR, Estimated: 4 mL/min — ABNORMAL LOW (ref >60)
Glucose, Bld: 79 mg/dL (ref 70–99)
Potassium: 4.9 mmol/L (ref 3.5–5.1)
Sodium: 134 mmol/L — ABNORMAL LOW (ref 135–145)

## 2021-10-22 MED ORDER — LABETALOL HCL 100 MG PO TABS
200.0000 mg | ORAL_TABLET | Freq: Two times a day (BID) | ORAL | Status: DC
  Administered 2021-10-22 – 2021-11-06 (×26): 200 mg via ORAL
  Filled 2021-10-22 (×3): qty 1
  Filled 2021-10-22: qty 2
  Filled 2021-10-22 (×6): qty 1
  Filled 2021-10-22 (×2): qty 2
  Filled 2021-10-22: qty 1
  Filled 2021-10-22 (×2): qty 2
  Filled 2021-10-22 (×6): qty 1
  Filled 2021-10-22: qty 2
  Filled 2021-10-22: qty 1
  Filled 2021-10-22: qty 2
  Filled 2021-10-22 (×5): qty 1

## 2021-10-22 MED ORDER — NEPRO/CARBSTEADY PO LIQD
237.0000 mL | Freq: Two times a day (BID) | ORAL | Status: DC
  Administered 2021-10-22 – 2021-11-05 (×11): 237 mL via ORAL

## 2021-10-22 MED ORDER — BENZONATATE 100 MG PO CAPS
100.0000 mg | ORAL_CAPSULE | Freq: Three times a day (TID) | ORAL | Status: DC | PRN
  Administered 2021-10-23 – 2021-10-24 (×3): 100 mg via ORAL
  Filled 2021-10-22 (×3): qty 1

## 2021-10-22 MED ORDER — RENA-VITE PO TABS
1.0000 | ORAL_TABLET | Freq: Every day | ORAL | Status: DC
  Administered 2021-10-22 – 2021-11-05 (×15): 1 via ORAL
  Filled 2021-10-22 (×17): qty 1

## 2021-10-22 MED ORDER — GABAPENTIN 300 MG PO CAPS
300.0000 mg | ORAL_CAPSULE | Freq: Every day | ORAL | Status: DC
  Administered 2021-10-23 – 2021-11-03 (×12): 300 mg via ORAL
  Filled 2021-10-22 (×12): qty 1

## 2021-10-22 NOTE — Progress Notes (Signed)
Nephrology Follow-Up Consult note   Assessment/Recommendations: Tammy Moses is a/an 55 y.o. female with a past medical history significant for DM2, HTN, COPD, admitted for patellar fracture.     Patellar fracture: In immobilizer.  Management per primary team  ESRD: previously at [facility] now [facility] per schedule today. Missed 12/27 -On higher dose of gabapentin than what is recommended for ESRD.  Will decrease from 600 mg twice daily to 300 mg twice daily.  Ideally Dose down to 300 mg daily  Hypertension/volume: Blood pressure fairly high.  Volume removal with dialysis.  Adjustment of blood pressure medications after dialysis.  Currently on amlodipine 10 mg daily, Lasix 40 mg daily, hydralazine 100 mg 3 times daily.  Likely would add ARB and possibly increase Lasix dose given it is likely ineffective at current dose.  Anemia secondary to ESRD: Iron saturation 22 and ferritin 101.  IV iron ordered.  Continue Aranesp   secondary hyperparathyroidism: Continue PhosLo.  Follow-up PTH.  Diabetes Mellitus Type 2: Continue management per primary team   Recommendations conveyed to primary service.    [facility] 10/22/2021 12:05 PM  ___________________________________________________________  CC: fall/ patellar fx  Interval History/Subjective: Patient feels well today with no specific complaints.   Medications:  Current Facility-Administered Medications  Medication Dose Route Frequency Provider Last Rate Last Admin   acetaminophen (TYLENOL) tablet 1,000 mg  1,000 mg Oral Q6H PRN Wynetta Fines T, MD       albuterol (PROVENTIL) (2.5 MG/3ML) 0.083% nebulizer solution 3 mL  3 mL Inhalation Q4H PRN Wynetta Fines T, MD       amLODipine (NORVASC) tablet 10 mg  10 mg Oral Daily Wynetta Fines T, MD   10 mg at 10/22/21 0800   atropine 1 % ophthalmic solution 1 drop  1 drop Both Eyes BID Wynetta Fines T, MD   1 drop at 10/22/21 0802   buPROPion [facility]  SR) 12 hr tablet 150 mg  150 mg Oral Daily Wynetta Fines T, MD   150 mg at 10/22/21 0801   calcium acetate (PHOSLO) capsule 2,001 mg  2,001 mg Oral TID WC Wynetta Fines T, MD   2,001 mg at 10/22/21 0800   Chlorhexidine Gluconate Cloth 2 % PADS 6 each  6 each Topical Q0600 Corliss Parish, MD   6 each at 10/22/21 0635   dorzolamide-timolol (COSOPT) 22.3-6.8 MG/ML ophthalmic solution 1 drop  1 drop Both Eyes BID Wynetta Fines T, MD   1 drop at 10/22/21 0802   furosemide (LASIX) tablet 40 mg  40 mg Oral Daily Wynetta Fines T, MD   40 mg at 10/22/21 0801   gabapentin (NEURONTIN) tablet 600 mg  600 mg Oral BID Wynetta Fines T, MD   600 mg at 10/22/21 0801   heparin injection 5,000 Units  5,000 Units Subcutaneous Q12H Wynetta Fines T, MD   5,000 Units at 10/22/21 0801   hydrALAZINE (APRESOLINE) tablet 100 mg  100 mg Oral Q8H Wynetta Fines T, MD   100 mg at 10/22/21 4166   HYDROcodone-acetaminophen (NORCO/VICODIN) 5-325 MG per tablet 1-2 tablet  1-2 tablet Oral Q6H PRN Wynetta Fines T, MD       HYDROmorphone (DILAUDID) injection 0.5 mg  0.5 mg Intravenous Q2H PRN Wynetta Fines T, MD   0.5 mg at 10/22/21 0203   insulin aspart (novoLOG) injection 0-6 Units  0-6 Units Subcutaneous TID WC Lequita Halt, MD       lamoTRIgine (LAMICTAL) tablet 25  mg  25 mg Oral Daily Wynetta Fines T, MD   25 mg at 10/22/21 0801   latanoprost (XALATAN) 0.005 % ophthalmic solution 1 drop  1 drop Both Eyes QHS Wynetta Fines T, MD   1 drop at 10/21/21 2235   metoCLOPramide (REGLAN) tablet 10 mg  10 mg Oral BID PRN Lequita Halt, MD       pravastatin (PRAVACHOL) tablet 80 mg  80 mg Oral Daily Wynetta Fines T, MD   80 mg at 10/22/21 0800   senna-docusate (Senokot-S) tablet 1 tablet  1 tablet Oral QHS PRN Lequita Halt, MD          Review of Systems: 10 systems reviewed and negative except per interval history/subjective  Physical Exam: Vitals:   10/22/21 0630 10/22/21 0733  BP: (!) 198/81 (!) 188/72  Pulse: 81 81  Resp: 20 16  Temp: 97.9 F  (36.6 C) 98.1 F (36.7 C)  SpO2: 95% 96%   No intake/output data recorded.  Intake/Output Summary (Last 24 hours) at 10/22/2021 1205 Last data filed at 10/22/2021 0500 Gross per 24 hour  Intake 360 ml  Output --  Net 360 ml   Constitutional: well-appearing, no acute distress ENMT: ears and nose without scars or lesions, MMM CV: normal rate, no edema Respiratory: Bilateral chest rise, normal work of breathing Gastrointestinal: soft, non-tender, no palpable masses or hernias Skin: no visible lesions or rashes Psych: alert, judgement/insight appropriate, appropriate mood and affect   Test Results I personally reviewed new and old clinical labs and radiology tests Lab Results  Component Value Date   NA 134 (L) 10/22/2021   K 4.9 10/22/2021   CL 99 10/22/2021   CO2 20 (L) 10/22/2021   BUN 53 (H) 10/22/2021   CREATININE 10.16 (H) 10/22/2021   CALCIUM 8.7 (L) 10/22/2021   ALBUMIN 2.7 (L) 10/09/2021   PHOS 5.2 (H) 10/09/2021

## 2021-10-22 NOTE — Progress Notes (Signed)
Physical Therapy Treatment Patient Details Name: Tammy Moses MRN: [id number] DOB: Jun 06, 1966 Today's Date: 10/22/2021   History of Present Illness Pt is a 55 y/o female presenting to the ED on 12/28 secondary to fall. Found to have R patellar fx. PMH includes ESRD on HD, HTN, DM, COPD, blindness, tobacco abuse.    PT Comments    Unable to work on progressing transfers today as OT is leaving room as PT arrives and pt is fatigued from transfer and agreeable to LE therex only while seated in chair.  Pt demos good tolerance to therex with AA due to B LE weakness.  Based on conversation with OT, pt remains unsafe with return home at this time due to inability to follow WB restrictions, visual deficits, dec balance and dec LE strength making her at high risk for falls without support available at home.  Pt would benefit from LTC placement vs. SNF.  If unable to find placement, pt would require inc support at home to provide max assist with transfers, ADLs, meal prep to prevent further falls and injury.  Recommendations for follow up therapy are one component of a multi-disciplinary discharge planning process, led by the attending physician.  Recommendations may be updated based on patient status, additional functional criteria and insurance authorization.  Follow Up Recommendations  Skilled nursing-short term rehab (<3 hours/day)     Assistance Recommended at Discharge Frequent or constant Supervision/Assistance  Equipment Recommendations  Rolling walker (2 wheels);Wheelchair (measurements PT)    Recommendations for Other Services       Precautions / Restrictions Precautions Precautions: Fall Required Braces or Orthoses: Knee Immobilizer - Right Restrictions Weight Bearing Restrictions: Yes RLE Weight Bearing: Non weight bearing     Mobility  Bed Mobility                    Transfers                        Ambulation/Gait                   Stairs              Wheelchair Mobility    Modified Rankin (Stroke Patients Only)       Balance                                            Cognition Arousal/Alertness: Awake/alert Behavior During Therapy: WFL for tasks assessed/performed Overall Cognitive Status: Within Functional Limits for tasks assessed                                 General Comments: Pt. had just finished transferring to chair with OT when PT arrives and is getting set up to eat breakfast.  Pt. declines any further activity out of chair, but is agreeable to LE therex after eating.  Per OT, transfer was more difficult today with pt having trouble maintaining NWB status.        Exercises General Exercises - Lower Extremity Ankle Circles/Pumps: AROM;Left;20 reps Heel Slides: AROM;Left;20 reps Hip ABduction/ADduction: AAROM;Both;20 reps Straight Leg Raises: AAROM;Both;20 reps    General Comments        Pertinent Vitals/Pain Pain Assessment: Faces Faces Pain Scale: Hurts a little bit Pain Location: R LE Pain  Descriptors / Indicators: Discomfort Pain Intervention(s): Limited activity within patient's tolerance    Home Living                          Prior Function            PT Goals (current goals can now be found in the care plan section) Progress towards PT goals: Not progressing toward goals - comment (Having difficulty maintaining weightbearing precautions, limited by visual deficits)    Frequency    Min 3X/week      PT Plan Current plan remains appropriate    Co-evaluation              AM-PAC PT "6 Clicks" Mobility   Outcome Measure  Help needed turning from your back to your side while in a flat bed without using bedrails?: A Lot Help needed moving from lying on your back to sitting on the side of a flat bed without using bedrails?: A Lot Help needed moving to and from a bed to a chair (including a wheelchair)?: A Lot Help needed  standing up from a chair using your arms (e.g., wheelchair or bedside chair)?: A Lot Help needed to walk in hospital room?: A Lot Help needed climbing 3-5 steps with a railing? : Total 6 Click Score: 11    End of Session   Activity Tolerance: Patient tolerated treatment well Patient left: in chair;with call bell/phone within reach;with chair alarm set         Time: 8099-8338 PT Time Calculation (min) (ACUTE ONLY): 12 min  Charges:  $Therapeutic Exercise: 8-22 mins                    Michole Lecuyer A. Jailon Schaible, PT, DPT Acute Rehabilitation Services Office: [place] 10/22/2021, 10:40 AM

## 2021-10-22 NOTE — TOC Progression Note (Signed)
Transition of Care [facility]) - Progression Note    Patient Details  Name: Tammy Moses MRN: [id number] Date of Birth: 1966/03/29  Transition of Care [facility]) CM/SW Contact  Joanne Chars, LCSW Phone Number: 10/22/2021, 4:03 PM  Clinical Narrative:   CSW met with pt regarding recommendation for SNF.  Pt agreeable to this, choice document provided.  Pt was at [facility] for 8 days earlier this month, pt wants to find facility [place].  Permission given to speak with mother dorothy.  Pt is vaccinated for covid with one booster.    Pt referral sent out in hub.      Expected Discharge Plan: [place] Barriers to Discharge: Continued Medical Work up, SNF Pending bed offer  Expected Discharge Plan and Services Expected Discharge Plan: [place] Choice: [place] Living arrangements for the past 2 months: [place], Apartment                   DME Agency: [employer]       HH Arranged: PT [place] Agency: [facility] ([facility])         Social Determinants of Health (SDOH) Interventions    Readmission Risk Interventions Readmission Risk Prevention Plan 10/09/2021  Transportation Screening Complete  HRI or Home Care Consult Complete  SW Recovery Care/Counseling Consult Complete  Palliative Care Screening Not Applicable  Skilled Nursing Facility Complete  Some recent data might be hidden

## 2021-10-22 NOTE — Progress Notes (Addendum)
Occupational Therapy Treatment Patient Details Name: Tammy Moses MRN: [id number] DOB: 01/14/1966 Today's Date: 10/22/2021   History of present illness Pt is a 55 y/o female presenting to the ED on 12/28 secondary to fall. Found to have R patellar fx. PMH includes ESRD on HD, HTN, DM, COPD, blindness, tobacco abuse.   OT comments  Pt required min A for bed mobility due to difficulty sequencing, and max A for pivot transfer to the chair.  She was unable to maintain NWB today.  She was able to self feed once she was instructed in location of her food items.  Recommend SNF level rehab    Recommendations for follow up therapy are one component of a multi-disciplinary discharge planning process, led by the attending physician.  Recommendations may be updated based on patient status, additional functional criteria and insurance authorization.    Follow Up Recommendations  Skilled nursing-short term rehab (<3 hours/day)    Assistance Recommended at Discharge Frequent or constant Supervision/Assistance  Equipment Recommendations  BSC/3in1    Recommendations for Other Services      Precautions / Restrictions Precautions Precautions: Fall Precaution Comments: blind, R foot drop Required Braces or Orthoses: Knee Immobilizer - Right Restrictions Weight Bearing Restrictions: Yes RLE Weight Bearing: Non weight bearing       Mobility Bed Mobility Overal bed mobility: Needs Assistance Bed Mobility: Supine to Sit     Supine to sit: Min assist     General bed mobility comments: Pt required step by step cues for sequencing and assist to move Rt LE off the bed and to lift trunk    Transfers                         Balance Overall balance assessment: Needs assistance Sitting-balance support: No upper extremity supported;Feet supported Sitting balance-Leahy Scale: Fair     Standing balance support: Bilateral upper extremity supported;During functional activity Standing  balance-Leahy Scale: Poor                             ADL either performed or assessed with clinical judgement   ADL Overall ADL's : Needs assistance/impaired Eating/Feeding: Set up;Sitting Eating/Feeding Details (indicate cue type and reason): uses clock method for self feeding                     Toilet Transfer: Maximal assistance;Stand-pivot;BSC/3in1;Rolling walker (2 wheels) Toilet Transfer Details (indicate cue type and reason): Pt required step by step cues and demonstrated difficulty with maintaining NWB on Rt LE this date         Functional mobility during ADLs: Maximal assistance;Rolling walker (2 wheels)      Extremity/Trunk Assessment Upper Extremity Assessment Upper Extremity Assessment: Overall WFL for tasks assessed   Lower Extremity Assessment Lower Extremity Assessment: Defer to PT evaluation        Vision   Vision Assessment?: Vision impaired- to be further tested in functional context Additional Comments: Pt is blind   Perception     Praxis      Cognition Arousal/Alertness: Awake/alert Behavior During Therapy: WFL for tasks assessed/performed Overall Cognitive Status: No family/caregiver present to determine baseline cognitive functioning                                 General Comments: Pt required cues for precautions.  She frequently requires  redirection during activity - visual deficits coupled with unfamiliar environment may be contributing.  Anticipate she may be at baseline          Exercises    Shoulder Instructions       General Comments      Pertinent Vitals/ Pain       Pain Assessment: Faces Faces Pain Scale: Hurts a little bit Pain Location: R LE Pain Descriptors / Indicators: Discomfort Pain Intervention(s): Monitored during session;Repositioned;Limited activity within patient's tolerance  Home Living                                          Prior  Functioning/Environment              Frequency  Min 2X/week        Progress Toward Goals  OT Goals(current goals can now be found in the care plan section)  Progress towards OT goals: Progressing toward goals     Plan Discharge plan remains appropriate;Frequency remains appropriate    Co-evaluation                 AM-PAC OT "6 Clicks" Daily Activity     Outcome Measure   Help from another person eating meals?: None Help from another person taking care of personal grooming?: A Little Help from another person toileting, which includes using toliet, bedpan, or urinal?: A Lot Help from another person bathing (including washing, rinsing, drying)?: A Lot Help from another person to put on and taking off regular upper body clothing?: A Little Help from another person to put on and taking off regular lower body clothing?: Total 6 Click Score: 15    End of Session Equipment Utilized During Treatment: Gait belt;Rolling walker (2 wheels)  OT Visit Diagnosis: Unsteadiness on feet (R26.81);Muscle weakness (generalized) (M62.81);Low vision, both eyes (H54.2)   Activity Tolerance Patient tolerated treatment well   Patient Left in chair;with call bell/phone within reach;with chair alarm set   Nurse Communication Mobility status        Time: 8381-8403 OT Time Calculation (min): 23 min  Charges: OT General Charges $OT Visit: 1 Visit OT Treatments $Therapeutic Activity: 23-37 mins  Nilsa Nutting., OTR/L Acute Rehabilitation Services Pager [phone] Office [phone]   Lucille Passy M 10/22/2021, 12:10 PM

## 2021-10-22 NOTE — Care Management Obs Status (Signed)
[facility] NOTIFICATION   Patient Details  Name: Tammy Moses MRN: [id number] Date of Birth: [birth year]   Medicare Observation Status Notification Given:  Yes    Cyndi Bender, RN 10/22/2021, 9:49 AM

## 2021-10-22 NOTE — Progress Notes (Signed)
Initial Nutrition Assessment  DOCUMENTATION CODES:   Not applicable  INTERVENTION:  -obtain new measured weight -Nepro Shake po BID, each supplement provides 425 kcal and 19 grams protein -renal mvi daily   NUTRITION DIAGNOSIS:   Increased nutrient needs related to chronic illness, other (see comment) (ESRD on HD; knee fracture) as evidenced by estimated needs.  GOAL:   Patient will meet greater than or equal to 90% of their needs  MONITOR:   PO intake, Supplement acceptance, Labs, Weight trends, I & O's  REASON FOR ASSESSMENT:   Consult Hip fracture protocol  ASSESSMENT:   55 y.o. female with PMH significant of ESRD on HD, HTN, normocytic anemia 2/2 CKD, COPD, blindness, and tobacco abuse admitted with R knee patella nondisplaced fracture.  Pt unavailable at time of RD visit. Per RN, pt refused to have HD today. Nephrology noted pt also missed HD on 12/27. No EDW documented. No weight taken for this admission. Will utilize weight on file from 10/09/21 - 55.2 kg - and order new wt so wt hx can be assessed.   PO Intake: none documented   UOP: 3x unmeasured occurrences x24 hours I/O: +368ml since admit  Medications:  amLODipine  10 mg Oral Daily   atropine  1 drop Both Eyes BID   buPROPion  150 mg Oral Daily   calcium acetate  2,001 mg Oral TID WC   Chlorhexidine Gluconate Cloth  6 each Topical Q0600   dorzolamide-timolol  1 drop Both Eyes BID   furosemide  40 mg Oral Daily   [START ON 10/23/2021] gabapentin  300 mg Oral q1800   heparin  5,000 Units Subcutaneous Q12H   hydrALAZINE  100 mg Oral Q8H   insulin aspart  0-6 Units Subcutaneous TID WC   lamoTRIgine  25 mg Oral Daily   latanoprost  1 drop Both Eyes QHS   pravastatin  80 mg Oral Daily   Labs: Recent Labs  Lab 10/21/21 1425 10/22/21 0309  NA 137 134*  K 4.5 4.9  CL 98 99  CO2 24 20*  BUN 49* 53*  CREATININE 9.29* 10.16*  CALCIUM 9.4 8.7*  GLUCOSE 106* 79  CBGs: 72-118 x24 hours   NUTRITION -  FOCUSED PHYSICAL EXAM: Unable to perform at this time. Will attempt at follow-up.   Diet Order:   Diet Order             Diet renal with fluid restriction Fluid restriction: 1200 mL Fluid; Room service appropriate? Yes; Fluid consistency: Thin  Diet effective now                   EDUCATION NEEDS:   No education needs have been identified at this time  Skin:  Skin Assessment: Reviewed RN Assessment  Last BM:  12/28  Height:   Ht Readings from Last 1 Encounters:  10/01/21 5\' 1"  (1.549 m)    Weight:   Wt Readings from Last 1 Encounters:  10/09/21 55.2 kg    BMI:  There is no height or weight on file to calculate BMI.  Estimated Nutritional Needs:   Kcal:  1700-1900  Protein:  85-95 grams  Fluid:  1L+UOP     Theone Stanley., MS, RD, LDN (she/her/hers) RD pager number and weekend/on-call pager number located in [place].

## 2021-10-22 NOTE — Progress Notes (Signed)
°  Mobility Specialist Criteria Algorithm Info.   10/22/21 1444  Mobility  Activity Refused mobility (Declined for unspecified reaons)      10/22/2021 2:44 PM

## 2021-10-22 NOTE — Plan of Care (Signed)
°  Problem: Education: Goal: Verbalization of understanding the information provided (i.e., activity precautions, restrictions, etc) will improve Outcome: Progressing   Problem: Activity: Goal: Ability to ambulate and perform ADLs will improve Outcome: Progressing   Problem: Clinical Measurements: Goal: Postoperative complications will be avoided or minimized Outcome: Progressing   Problem: Self-Concept: Goal: Ability to maintain and perform role responsibilities to the fullest extent possible will improve Outcome: Progressing

## 2021-10-22 NOTE — TOC CAGE-AID Note (Signed)
Transition of Care [facility]) - CAGE-AID Screening   Patient Details  Name: Tammy Moses MRN: [id number] Date of Birth: [birth year]  Transition of [facility]) CM/SW Contact:    [name], LCSWA Phone Number: 10/22/2021, 3:07 PM   Clinical Narrative: Pt participated in [place].  Pt stated she does not use substance or ETOH.  Pt was offered resources, due to past usage of substance or ETOH.  CSW will provide resources for possible future use.  [name], MSW, LCSW-A Pronouns:  She/Her/Hers [place] Transitions of Care Clinical Social Worker Direct Number:  [phone] [name]   CAGE-AID Screening:    Have You Ever Felt You Ought to Cut Down on Your Drinking or Drug Use?: No Have People Annoyed You By [employer] Your Drinking Or Drug Use?: No Have You Felt Bad Or Guilty About Your Drinking Or Drug Use?: No Have You Ever Had a Drink or Used Drugs First Thing In The Morning to Steady Your Nerves or to Get Rid of a Hangover?: No CAGE-AID Score: 0  Substance Abuse Education Offered: Yes  Substance abuse interventions: Scientist, clinical (histocompatibility and immunogenetics)

## 2021-10-22 NOTE — H&P (View-Only) (Signed)
[facility]  Patient name: Tammy Moses MRN: [id number] DOB: 1966/03/20 Sex: female   REQUESTING PROVIDER:   Hospital service   REASON FOR CONSULT:    Right great toe gangrene  HISTORY OF PRESENT ILLNESS:   Tammy Moses is a 55 y.o. female, who was admitted yesterday with shortness of breath and flash pulmonary edema.  She has been noncompliant with her dialysis treatments.  Her breathing improved with dialysis.  She does suffer from COPD.  She is a diabetic.  She also has malignant hypertension.  She has discoloration of her right second toe  PAST MEDICAL HISTORY    Past Medical History:  Diagnosis Date   Anemia    Anxiety    Blind right eye    Chronic kidney disease, stage V (HCC)    Constipation, chronic    Dental caries    Diabetes mellitus    Diabetic neuropathy (HCC)    Diabetic retinopathy    GERD (gastroesophageal reflux disease)    Glaucoma    H. pylori infection    Hepatitis C carrier ([place])    High risk sexual behavior    Hyperlipidemia    Hypertension    Hypoglycemia 07/12/2017   Insomnia    Microalbuminuria    Nonspecific elevation of levels of transaminase or lactic acid dehydrogenase (LDH)    Tobacco dependence    Vitamin D deficiency      FAMILY HISTORY   Family History  Problem Relation Age of Onset   High blood pressure Mother    Diabetes Mother    Thyroid disease Mother    Diabetes Father    High blood pressure Father    Cerebral palsy Daughter    Other Son        still born    SOCIAL HISTORY:   Social History   Socioeconomic History   Marital status: Divorced    Spouse name: Not on file   Number of children: 3   Years of education: 11   Highest education level: Not on file  Occupational History    Employer: DISABLED    Comment: Disabled  Tobacco Use   Smoking status: Every Day    Packs/day: 0.25    Types: Cigarettes   Smokeless tobacco: Never    Tobacco comments:    trying to quit  Vaping Use   Vaping Use: Never used  Substance and Sexual Activity   Alcohol use: Yes    Comment: 2-3 beers weekly   Drug use: Not Currently    Types: Marijuana, Cocaine    Comment: quit in 2018   Sexual activity: Yes  Other Topics Concern   Not on file  Social History Narrative   Patient lives at home alone.   Disabled.   Right handed.   Education 11 th grade.   Caffeine three cups of coffee daily.   No psychiatrist   Social Determinants of Health   Financial Resource Strain: Not on file  Food Insecurity: Not on file  Transportation Needs: Not on file  Physical Activity: Not on file  Stress: Not on file  Social Connections: Not on file  Intimate Partner Violence: Not on file    ALLERGIES:    Allergies  Allergen Reactions   Acyclovir And Related Itching    CURRENT MEDICATIONS:    Current Facility-Administered Medications  Medication Dose Route Frequency Provider Last Rate Last Admin   acetaminophen (TYLENOL) tablet 1,000 mg  1,000 mg Oral Q6H PRN  Lequita Halt, MD       albuterol (PROVENTIL) (2.5 MG/3ML) 0.083% nebulizer solution 3 mL  3 mL Inhalation Q4H PRN Wynetta Fines T, MD       amLODipine (NORVASC) tablet 10 mg  10 mg Oral Daily Wynetta Fines T, MD   10 mg at 10/22/21 0800   atropine 1 % ophthalmic solution 1 drop  1 drop Both Eyes BID Wynetta Fines T, MD   1 drop at 10/22/21 0802   buPROPion [facility]) 12 hr tablet 150 mg  150 mg Oral Daily Wynetta Fines T, MD   150 mg at 10/22/21 0801   calcium acetate (PHOSLO) capsule 2,001 mg  2,001 mg Oral TID WC Lequita Halt, MD   2,001 mg at 10/22/21 1230   Chlorhexidine Gluconate Cloth 2 % PADS 6 each  6 each Topical Q0600 Corliss Parish, MD   6 each at 10/22/21 0635   dorzolamide-timolol (COSOPT) 22.3-6.8 MG/ML ophthalmic solution 1 drop  1 drop Both Eyes BID Wynetta Fines T, MD   1 drop at 10/22/21 0802   feeding supplement (NEPRO CARB STEADY) liquid 237 mL  237 mL Oral BID  BM Oswald Hillock, MD   237 mL at 10/22/21 1612   furosemide (LASIX) tablet 40 mg  40 mg Oral Daily Wynetta Fines T, MD   40 mg at 10/22/21 0801   [START ON 10/23/2021] gabapentin (NEURONTIN) capsule 300 mg  300 mg Oral q1800 Reesa Chew, MD       heparin injection 5,000 Units  5,000 Units Subcutaneous Q12H Wynetta Fines T, MD   5,000 Units at 10/22/21 0801   hydrALAZINE (APRESOLINE) tablet 100 mg  100 mg Oral Q8H Wynetta Fines T, MD   100 mg at 10/22/21 1432   HYDROcodone-acetaminophen (NORCO/VICODIN) 5-325 MG per tablet 1-2 tablet  1-2 tablet Oral Q6H PRN Wynetta Fines T, MD       HYDROmorphone (DILAUDID) injection 0.5 mg  0.5 mg Intravenous Q2H PRN Wynetta Fines T, MD   0.5 mg at 10/22/21 9528   insulin aspart (novoLOG) injection 0-6 Units  0-6 Units Subcutaneous TID WC Lequita Halt, MD       lamoTRIgine (LAMICTAL) tablet 25 mg  25 mg Oral Daily Wynetta Fines T, MD   25 mg at 10/22/21 0801   latanoprost (XALATAN) 0.005 % ophthalmic solution 1 drop  1 drop Both Eyes QHS Wynetta Fines T, MD   1 drop at 10/21/21 2235   metoCLOPramide (REGLAN) tablet 10 mg  10 mg Oral BID PRN Lequita Halt, MD       multivitamin (RENA-VIT) tablet 1 tablet  1 tablet Oral QHS [facility] [place], Marge Duncans, MD       pravastatin (PRAVACHOL) tablet 80 mg  80 mg Oral Daily Wynetta Fines T, MD   80 mg at 10/22/21 0800   senna-docusate (Senokot-S) tablet 1 tablet  1 tablet Oral QHS PRN Lequita Halt, MD        REVIEW OF SYSTEMS:   [X]  denotes positive finding, [ ]  denotes negative finding Cardiac  Comments:  Chest pain or chest pressure:    Shortness of breath upon exertion: x   Short of breath when lying flat: x   Irregular heart rhythm:        Vascular    Pain in calf, thigh, or hip brought on by ambulation:    Pain in feet at night that wakes you up from your sleep:     Blood clot  in your veins:    Leg swelling:         Pulmonary    Oxygen at home:    Productive cough:     Wheezing:         Neurologic    Sudden weakness in arms  or legs:     Sudden numbness in arms or legs:     Sudden onset of difficulty speaking or slurred speech:    Temporary loss of vision in one eye:     Problems with dizziness:         Gastrointestinal    Blood in stool:      Vomited blood:         Genitourinary    Burning when urinating:     Blood in urine:        Psychiatric    Major depression:         Hematologic    Bleeding problems:    Problems with blood clotting too easily:        Skin    Rashes or ulcers:        Constitutional    Fever or chills:     PHYSICAL EXAM:   Vitals:   10/21/21 1956 10/21/21 2031 10/22/21 0630 10/22/21 0733  BP: (!) 204/67 (!) 194/67 (!) 198/81 (!) 188/72  Pulse: 70 90 81 81  Resp: 20 18 20 16   Temp:  98.6 F (37 C) 97.9 F (36.6 C) 98.1 F (36.7 C)  TempSrc:  Oral Oral   SpO2: 97% 95% 95% 96%    GENERAL: The patient is a well-nourished female, in no acute distress. The vital signs are documented above. CARDIAC: There is a regular rate and rhythm.  VASCULAR: Unable to palpate femoral pulses PULMONARY: Nonlabored respirations ABDOMEN: Soft and non-tender  MUSCULOSKELETAL: There are no major deformities or cyanosis. NEUROLOGIC: No focal weakness or paresthesias are detected. SKIN: See photo below PSYCHIATRIC: The patient has a normal affect.   STUDIES:   I have reviewed her vascular lab studies with the following findings: ABI Findings:  +---------+------------------+-----+----------+----------------------------  ----+   Right     Rt Pressure (mmHg) Index Waveform   Comment                             +---------+------------------+-----+----------+----------------------------  ----+   Brachial  188                      triphasic  Pressure was taken from BP                                                          monitor. Waveforms were                                                            noncompressible with ABI  equipment.                           +---------+------------------+-----+----------+----------------------------  ----+   PTA       43                 0.23  monophasic                                      +---------+------------------+-----+----------+----------------------------  ----+   DP        65                 0.35  monophasic                                      +---------+------------------+-----+----------+----------------------------  ----+   Great Toe                          Absent                                          +---------+------------------+-----+----------+----------------------------  ----+   +---------+------------------+-----+----------+---------+   Left      Lt Pressure (mmHg) Index Waveform   Comment     +---------+------------------+-----+----------+---------+   Brachial                                      HD Access   +---------+------------------+-----+----------+---------+   PTA       254                1.35  monophasic             +---------+------------------+-----+----------+---------+   DP        254                1.35  monophasic             +---------+------------------+-----+----------+---------+   Great Toe                          Absent                 +---------+------------------+-----+----------+---------+   +-------+-----------+-----------+------------+------------+   ABI/TBI Today's ABI Today's TBI Previous ABI Previous TBI   +-------+-----------+-----------+------------+------------+   Right   0.35                                                +-------+-----------+-----------+------------+------------+   Left    [place]                                                  +-------+-----------+-----------+------------+------------+  ASSESSMENT and PLAN   Right great toe gangrene: Patient has gangrenous changes to her right great toe likely secondary to her diabetes and renal failure.  She understands  that she is going to need a  toe amputation, however before proceeding with this, she will need arterial evaluation.  Since I could not palpate femoral pulses today I will start with a CT angiogram.  Further recommendations following her CT scan.   Leia Alf, MD, FACS [facility] [phone] Pager [phone]

## 2021-10-22 NOTE — Consult Note (Signed)
[facility] [place]  Patient name: Tammy Moses MRN: [id number] DOB: 1966-08-11 Sex: female   REQUESTING PROVIDER:   Hospital service   REASON FOR CONSULT:    Right great toe gangrene  HISTORY OF PRESENT ILLNESS:   Tammy Moses is a 55 y.o. female, who was admitted yesterday with shortness of breath and flash pulmonary edema.  She has been noncompliant with her dialysis treatments.  Her breathing improved with dialysis.  She does suffer from COPD.  She is a diabetic.  She also has malignant hypertension.  She has discoloration of her right second toe  PAST MEDICAL HISTORY    Past Medical History:  Diagnosis Date   Anemia    Anxiety    Blind right eye    Chronic kidney disease, stage V (HCC)    Constipation, chronic    Dental caries    Diabetes mellitus    Diabetic neuropathy (HCC)    Diabetic retinopathy    GERD (gastroesophageal reflux disease)    Glaucoma    H. pylori infection    Hepatitis C carrier ([place])    High risk sexual behavior    Hyperlipidemia    Hypertension    Hypoglycemia 07/12/2017   Insomnia    Microalbuminuria    Nonspecific elevation of levels of transaminase or lactic acid dehydrogenase (LDH)    Tobacco dependence    Vitamin D deficiency      FAMILY HISTORY   Family History  Problem Relation Age of Onset   High blood pressure Mother    Diabetes Mother    Thyroid disease Mother    Diabetes Father    High blood pressure Father    Cerebral palsy Daughter    Other Son        still born    SOCIAL HISTORY:   Social History   Socioeconomic History   Marital status: Divorced    Spouse name: Not on file   Number of children: 3   Years of education: 11   Highest education level: Not on file  Occupational History    Employer: DISABLED    Comment: Disabled  Tobacco Use   Smoking status: Every Day    Packs/day: 0.25    Types: Cigarettes   Smokeless tobacco: Never    Tobacco comments:    trying to quit  Vaping Use   Vaping Use: Never used  Substance and Sexual Activity   Alcohol use: Yes    Comment: 2-3 beers weekly   Drug use: Not Currently    Types: Marijuana, Cocaine    Comment: quit in 2018   Sexual activity: Yes  Other Topics Concern   Not on file  Social History Narrative   Patient lives at home alone.   Disabled.   Right handed.   Education 11 th grade.   Caffeine three cups of coffee daily.   No psychiatrist   Social Determinants of Health   Financial Resource Strain: Not on file  Food Insecurity: Not on file  Transportation Needs: Not on file  Physical Activity: Not on file  Stress: Not on file  Social Connections: Not on file  Intimate Partner Violence: Not on file    ALLERGIES:    Allergies  Allergen Reactions   Acyclovir And Related Itching    CURRENT MEDICATIONS:    Current Facility-Administered Medications  Medication Dose Route Frequency Provider Last Rate Last Admin   acetaminophen (TYLENOL) tablet 1,000 mg  1,000 mg Oral Q6H PRN  Lequita Halt, MD       albuterol (PROVENTIL) (2.5 MG/3ML) 0.083% nebulizer solution 3 mL  3 mL Inhalation Q4H PRN Wynetta Fines T, MD       amLODipine (NORVASC) tablet 10 mg  10 mg Oral Daily Wynetta Fines T, MD   10 mg at 10/22/21 0800   atropine 1 % ophthalmic solution 1 drop  1 drop Both Eyes BID Wynetta Fines T, MD   1 drop at 10/22/21 0802   buPROPion [facility] SR) 12 hr tablet 150 mg  150 mg Oral Daily Wynetta Fines T, MD   150 mg at 10/22/21 0801   calcium acetate (PHOSLO) capsule 2,001 mg  2,001 mg Oral TID WC Lequita Halt, MD   2,001 mg at 10/22/21 1230   Chlorhexidine Gluconate Cloth 2 % PADS 6 each  6 each Topical Q0600 Corliss Parish, MD   6 each at 10/22/21 0635   dorzolamide-timolol (COSOPT) 22.3-6.8 MG/ML ophthalmic solution 1 drop  1 drop Both Eyes BID Wynetta Fines T, MD   1 drop at 10/22/21 0802   feeding supplement (NEPRO CARB STEADY) liquid 237 mL  237 mL Oral BID  BM Oswald Hillock, MD   237 mL at 10/22/21 1612   furosemide (LASIX) tablet 40 mg  40 mg Oral Daily Wynetta Fines T, MD   40 mg at 10/22/21 0801   [START ON 10/23/2021] gabapentin (NEURONTIN) capsule 300 mg  300 mg Oral q1800 Reesa Chew, MD       heparin injection 5,000 Units  5,000 Units Subcutaneous Q12H Wynetta Fines T, MD   5,000 Units at 10/22/21 0801   hydrALAZINE (APRESOLINE) tablet 100 mg  100 mg Oral Q8H Wynetta Fines T, MD   100 mg at 10/22/21 1432   HYDROcodone-acetaminophen (NORCO/VICODIN) 5-325 MG per tablet 1-2 tablet  1-2 tablet Oral Q6H PRN Wynetta Fines T, MD       HYDROmorphone (DILAUDID) injection 0.5 mg  0.5 mg Intravenous Q2H PRN Wynetta Fines T, MD   0.5 mg at 10/22/21 6720   insulin aspart (novoLOG) injection 0-6 Units  0-6 Units Subcutaneous TID WC Lequita Halt, MD       lamoTRIgine (LAMICTAL) tablet 25 mg  25 mg Oral Daily Wynetta Fines T, MD   25 mg at 10/22/21 0801   latanoprost (XALATAN) 0.005 % ophthalmic solution 1 drop  1 drop Both Eyes QHS Wynetta Fines T, MD   1 drop at 10/21/21 2235   metoCLOPramide (REGLAN) tablet 10 mg  10 mg Oral BID PRN Lequita Halt, MD       multivitamin (RENA-VIT) tablet 1 tablet  1 tablet Oral QHS Darrick Meigs, Marge Duncans, MD       pravastatin (PRAVACHOL) tablet 80 mg  80 mg Oral Daily Wynetta Fines T, MD   80 mg at 10/22/21 0800   senna-docusate (Senokot-S) tablet 1 tablet  1 tablet Oral QHS PRN Lequita Halt, MD        REVIEW OF SYSTEMS:   [X]  denotes positive finding, [ ]  denotes negative finding Cardiac  Comments:  Chest pain or chest pressure:    Shortness of breath upon exertion: x   Short of breath when lying flat: x   Irregular heart rhythm:        Vascular    Pain in calf, thigh, or hip brought on by ambulation:    Pain in feet at night that wakes you up from your sleep:     Blood clot  in your veins:    Leg swelling:         Pulmonary    Oxygen at home:    Productive cough:     Wheezing:         Neurologic    Sudden weakness in arms  or legs:     Sudden numbness in arms or legs:     Sudden onset of difficulty speaking or slurred speech:    Temporary loss of vision in one eye:     Problems with dizziness:         Gastrointestinal    Blood in stool:      Vomited blood:         Genitourinary    Burning when urinating:     Blood in urine:        Psychiatric    Major depression:         Hematologic    Bleeding problems:    Problems with blood clotting too easily:        Skin    Rashes or ulcers:        Constitutional    Fever or chills:     PHYSICAL EXAM:   Vitals:   10/21/21 1956 10/21/21 2031 10/22/21 0630 10/22/21 0733  BP: (!) 204/67 (!) 194/67 (!) 198/81 (!) 188/72  Pulse: 70 90 81 81  Resp: 20 18 20 16   Temp:  98.6 F (37 C) 97.9 F (36.6 C) 98.1 F (36.7 C)  TempSrc:  Oral Oral   SpO2: 97% 95% 95% 96%    GENERAL: The patient is a well-nourished female, in no acute distress. The vital signs are documented above. CARDIAC: There is a regular rate and rhythm.  VASCULAR: Unable to palpate femoral pulses PULMONARY: Nonlabored respirations ABDOMEN: Soft and non-tender  MUSCULOSKELETAL: There are no major deformities or cyanosis. NEUROLOGIC: No focal weakness or paresthesias are detected. SKIN: See photo below PSYCHIATRIC: The patient has a normal affect.   STUDIES:   I have reviewed her vascular lab studies with the following findings: ABI Findings:  +---------+------------------+-----+----------+----------------------------  ----+   Right     Rt Pressure (mmHg) Index Waveform   Comment                             +---------+------------------+-----+----------+----------------------------  ----+   Brachial  188                      triphasic  Pressure was taken from BP                                                          monitor. Waveforms were                                                            noncompressible with ABI  equipment.                           +---------+------------------+-----+----------+----------------------------  ----+   PTA       43                 0.23  monophasic                                      +---------+------------------+-----+----------+----------------------------  ----+   DP        65                 0.35  monophasic                                      +---------+------------------+-----+----------+----------------------------  ----+   Great Toe                          Absent                                          +---------+------------------+-----+----------+----------------------------  ----+   +---------+------------------+-----+----------+---------+   Left      Lt Pressure (mmHg) Index Waveform   Comment     +---------+------------------+-----+----------+---------+   Brachial                                      HD Access   +---------+------------------+-----+----------+---------+   PTA       254                1.35  monophasic             +---------+------------------+-----+----------+---------+   DP        254                1.35  monophasic             +---------+------------------+-----+----------+---------+   Great Toe                          Absent                 +---------+------------------+-----+----------+---------+   +-------+-----------+-----------+------------+------------+   ABI/TBI Today's ABI Today's TBI Previous ABI Previous TBI   +-------+-----------+-----------+------------+------------+   Right   0.35                                                +-------+-----------+-----------+------------+------------+   Left    [place]                                                  +-------+-----------+-----------+------------+------------+  ASSESSMENT and PLAN   Right great toe gangrene: Patient has gangrenous changes to her right great toe likely secondary to her diabetes and renal failure.  She understands  that she is going to need a  toe amputation, however before proceeding with this, she will need arterial evaluation.  Since I could not palpate femoral pulses today I will start with a CT angiogram.  Further recommendations following her CT scan.   Leia Alf, MD, FACS [facility] [phone] Pager [phone]

## 2021-10-22 NOTE — Consult Note (Signed)
° °  [facility] Inpatient Consult   10/22/2021  Tammy Moses 07-30-1966 [id number]  [place] [facility] [ACO] Patient: [name]  Primary Care Provider:  [name], FNP,   Patient screened for hospitalization with noted less than 30 days hospitalization, currently in observation status noted.  Review of patient's medical record reveals patient is being recommended for a skilled nursing facility level of care and considering need for long term care.  Patient discussed in multidisciplinary conference call this morning for ongoing barriers to care needs.  Plan:   Continue to follow up on this finding.  For questions contact:   Natividad Brood, RN BSN [facility] Liaison  [phone] business mobile phone Toll free office [phone]  Fax number: [phone] [email or web address]

## 2021-10-22 NOTE — Progress Notes (Signed)
ABI's have been completed. Preliminary results can be found in CV Proc through chart review.   10/22/21 12:24 PM Tammy Moses RVT

## 2021-10-22 NOTE — Progress Notes (Signed)
Pt refused to have HD on today.  Charge nurse to notify MD.

## 2021-10-22 NOTE — NC FL2 (Signed)
[place] LEVEL OF CARE SCREENING TOOL     IDENTIFICATION  Patient Name: Tammy Moses Birthdate: Feb 25, 1966 Sex: female Admission Date (Current Location): 10/21/2021  [facility] and [place] Number:  Herbalist and Address:  [facility] [place], [place] [address], [place], [place] [address]      Provider Number: [id number]  Attending Physician Name and Address:  Oswald Hillock, MD  Relative Name and Phone Number:  Jeralene Huff    Current Level of Care: Hospital Recommended Level of Care: [place] Prior Approval Number:    Date Approved/Denied:   PASRR Number: [id number]  Discharge Plan: SNF    Current Diagnoses: Patient Active Problem List   Diagnosis Date Noted   Patellar sleeve fracture of right knee, closed, initial encounter 10/21/2021   Closed patellar sleeve fracture of right knee 10/21/2021   Malignant hypertension 09/30/2021   Flash pulmonary edema ([place]) 09/30/2021   Non compliance with medical treatment 09/30/2021   Foot pain, right 09/22/2021   Elevated troponin 09/16/2021   Anemia due to chronic kidney disease 09/16/2021   Impaired functional mobility and activity tolerance 01/04/2019   Pneumonia of both lungs due to infectious organism 10/05/2017   Fall    Anemia 07/12/2017   COPD (chronic obstructive pulmonary disease) ([place]) 06/24/2017   ESRD (end stage renal disease) ([place]) -  Dialyzes at [facility].  On Monday Wednesday Friday. 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[place]) 05/10/2017   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   Vitamin D deficiency 04/07/2017   Absolute glaucoma of right eye 12/27/2016   Chronic angle-closure glaucoma of left eye, severe stage 12/27/2016    PCO (posterior capsule opacification), left 12/27/2016   Gait abnormality 03/07/2014   Abnormality of gait 03/07/2014   Hepatitis C virus infection without hepatic coma 03/04/2014   History of hepatitis C 03/04/2014   Erosive gastritis 02/14/2014   Pseudophakia of left eye 10/30/2012   Asthma 10/13/2012   Reflux 10/13/2012   Lens replaced by other means 10/12/2012   Primary open angle glaucoma 10/12/2012   BACK PAIN, LUMBAR 08/18/2010   IDDM 08/11/2010   HYPERCHOLESTEROLEMIA 08/11/2010   HYPOKALEMIA 08/11/2010   DEPRESSION 08/11/2010   GLAUCOMA 08/11/2010   BLINDNESS, RIGHT EYE 08/11/2010   Essential hypertension 08/11/2010   GERD 08/11/2010   CONSTIPATION 08/06/2009    Orientation RESPIRATION BLADDER Height & Weight     Self, Time, Situation, Place  Normal Incontinent Weight:   Height:     BEHAVIORAL SYMPTOMS/MOOD NEUROLOGICAL BOWEL NUTRITION STATUS      Continent Diet (Low sodium/heart healthy)  AMBULATORY STATUS COMMUNICATION OF NEEDS Skin   Extensive Assist Verbally Normal                       Personal Care Assistance Level of Assistance  Bathing, Feeding, Dressing Bathing Assistance: Maximum assistance Feeding assistance: Independent Dressing Assistance: Limited assistance     Functional Limitations Info  Sight, Hearing, Speech Sight Info: Impaired Hearing Info: Adequate Speech Info: Adequate    SPECIAL CARE FACTORS FREQUENCY                       Contractures Contractures Info: Not present  Additional Factors Info  Code Status, Allergies, Insulin Sliding Scale Code Status Info: Full Allergies Info: Acyclovir And Related           Current Medications (10/22/2021):  This is the current hospital active medication list Current Facility-Administered Medications  Medication Dose Route Frequency Provider Last Rate Last Admin   acetaminophen (TYLENOL) tablet 1,000 mg  1,000 mg Oral Q6H PRN Wynetta Fines T, MD       albuterol (PROVENTIL)  (2.5 MG/3ML) 0.083% nebulizer solution 3 mL  3 mL Inhalation Q4H PRN Wynetta Fines T, MD       amLODipine (NORVASC) tablet 10 mg  10 mg Oral Daily Wynetta Fines T, MD   10 mg at 10/22/21 0800   atropine 1 % ophthalmic solution 1 drop  1 drop Both Eyes BID Wynetta Fines T, MD   1 drop at 10/22/21 0802   buPROPion (WELLBUTRIN SR) 12 hr tablet 150 mg  150 mg Oral Daily Wynetta Fines T, MD   150 mg at 10/22/21 0801   calcium acetate (PHOSLO) capsule 2,001 mg  2,001 mg Oral TID WC Wynetta Fines T, MD   2,001 mg at 10/22/21 1230   Chlorhexidine Gluconate Cloth 2 % PADS 6 each  6 each Topical Q0600 Corliss Parish, MD   6 each at 10/22/21 0635   dorzolamide-timolol (COSOPT) 22.3-6.8 MG/ML ophthalmic solution 1 drop  1 drop Both Eyes BID Wynetta Fines T, MD   1 drop at 10/22/21 0802   feeding supplement (NEPRO CARB STEADY) liquid 237 mL  237 mL Oral BID BM Oswald Hillock, MD       furosemide (LASIX) tablet 40 mg  40 mg Oral Daily Wynetta Fines T, MD   40 mg at 10/22/21 0801   [START ON 10/23/2021] gabapentin (NEURONTIN) capsule 300 mg  300 mg Oral q1800 Reesa Chew, MD       heparin injection 5,000 Units  5,000 Units Subcutaneous Q12H Wynetta Fines T, MD   5,000 Units at 10/22/21 0801   hydrALAZINE (APRESOLINE) tablet 100 mg  100 mg Oral Q8H Wynetta Fines T, MD   100 mg at 10/22/21 1432   HYDROcodone-acetaminophen (NORCO/VICODIN) 5-325 MG per tablet 1-2 tablet  1-2 tablet Oral Q6H PRN Wynetta Fines T, MD       HYDROmorphone (DILAUDID) injection 0.5 mg  0.5 mg Intravenous Q2H PRN Wynetta Fines T, MD   0.5 mg at 10/22/21 0203   insulin aspart (novoLOG) injection 0-6 Units  0-6 Units Subcutaneous TID WC Wynetta Fines T, MD       lamoTRIgine (LAMICTAL) tablet 25 mg  25 mg Oral Daily Wynetta Fines T, MD   25 mg at 10/22/21 0801   latanoprost (XALATAN) 0.005 % ophthalmic solution 1 drop  1 drop Both Eyes QHS Wynetta Fines T, MD   1 drop at 10/21/21 2235   metoCLOPramide (REGLAN) tablet 10 mg  10 mg Oral BID PRN Lequita Halt, MD        multivitamin (RENA-VIT) tablet 1 tablet  1 tablet Oral QHS Darrick Meigs, Marge Duncans, MD       pravastatin (PRAVACHOL) tablet 80 mg  80 mg Oral Daily Wynetta Fines T, MD   80 mg at 10/22/21 0800   senna-docusate (Senokot-S) tablet 1 tablet  1 tablet Oral QHS PRN Lequita Halt, MD         Discharge Medications: Please see discharge summary for a list of discharge medications.  Relevant Imaging Results:  Relevant Lab Results:  Additional Information SSN [id number] HD MWF in [place] Pt reports vaccinated with one booster  Esequiel Kleinfelter, Veronia Beets, LCSW

## 2021-10-22 NOTE — Progress Notes (Signed)
Ok to reduce gabapentin to 300mg  qday due to ESRD per Dr. Darrick Meigs.  Onnie Boer, PharmD, BCIDP, AAHIVP, CPP Infectious Disease Pharmacist 10/22/2021 12:21 PM

## 2021-10-22 NOTE — Progress Notes (Signed)
[facility]  PROGRESS NOTE  Tammy Moses VZD:[id number] DOB: [birth year] DOA: 10/21/2021 PCP: [name], FNP   Brief HPI:   55 year old female with medical history of ESRD on HD MWF, hypertension, normocytic anemia 622 CKD, COPD, blindness, tobacco abuse presented with persistent right knee pain.  Patient fell down yesterday after completing HD session when she slipped on ice and fell on her right knee.  She was able to stand up but experienced pain and swelling of right knee with minimal movement.  In the ED she was found to have nondisplaced right patella fracture on x-ray. Orthopedic surgery Hilbert Odor, PA was contacted by ED provider and they recommended right knee brace with physical therapy and follow-up with orthopedics as outpatient. She was also found to have right toe gangrene, ABIs were ordered which showed severe PAD on right.   Subjective   Patient seen and examined, denies abdominal pain, no nausea vomiting.  No chest pain or shortness of breath.   Assessment/Plan:    Right knee patella nondisplaced fracture -Orthopedics, Hilbert Odor, PA was contacted by ED provider -Recommended knee immobilizer and follow-up for patella fracture treatment as outpatient -Knee immobilizer has been placed -Likely will need skilled nursing facility placement -PT consulted  Right big toe discoloration/gangrene -ABIs obtained today shows severe PAD on right -We will consult vascular surgery for further recommendations -Likely will need amputation  Hypertension -Blood pressure is elevated -Home medication including amlodipine, furosemide, hydralazine has been started -We will add labetalol 200 mg p.o. twice daily  ESRD on hemodialysis -Patient on hemodialysis MWF -Nephrology consulted  Diabetes mellitus type 2 -CBG well controlled -Continue sliding scale insulin with NovoLog      Medications     amLODipine  10 mg Oral Daily   atropine  1 drop Both  Eyes BID   buPROPion  150 mg Oral Daily   calcium acetate  2,001 mg Oral TID WC   Chlorhexidine Gluconate Cloth  6 each Topical Q0600   dorzolamide-timolol  1 drop Both Eyes BID   feeding supplement (NEPRO CARB STEADY)  237 mL Oral BID BM   furosemide  40 mg Oral Daily   [START ON 10/23/2021] gabapentin  300 mg Oral q1800   heparin  5,000 Units Subcutaneous Q12H   hydrALAZINE  100 mg Oral Q8H   insulin aspart  0-6 Units Subcutaneous TID WC   lamoTRIgine  25 mg Oral Daily   latanoprost  1 drop Both Eyes QHS   multivitamin  1 tablet Oral QHS   pravastatin  80 mg Oral Daily     Data Reviewed:   CBG:  Recent Labs  Lab 10/21/21 2236 10/22/21 0653 10/22/21 0731 10/22/21 1156 10/22/21 1532  GLUCAP 118* 96 106* 109* 150*    SpO2: 96 %    Vitals:   10/21/21 1956 10/21/21 2031 10/22/21 0630 10/22/21 0733  BP: (!) 204/67 (!) 194/67 (!) 198/81 (!) 188/72  Pulse: 70 90 81 81  Resp: 20 18 20 16   Temp:  98.6 F (37 C) 97.9 F (36.6 C) 98.1 F (36.7 C)  TempSrc:  Oral Oral   SpO2: 97% 95% 95% 96%     Intake/Output Summary (Last 24 hours) at 10/22/2021 1749 Last data filed at 10/22/2021 0500 Gross per 24 hour  Intake 360 ml  Output --  Net 360 ml    12/27 1901 - 12/29 0700 In: 360 [P.O.:360] Out: -   There were no vitals filed for this visit.  Data Reviewed:  Basic Metabolic Panel: Recent Labs  Lab 10/21/21 1425 10/22/21 0309  NA 137 134*  K 4.5 4.9  CL 98 99  CO2 24 20*  GLUCOSE 106* 79  BUN 49* 53*  CREATININE 9.29* 10.16*  CALCIUM 9.4 8.7*   Liver Function Tests: No results for input(s): AST, ALT, ALKPHOS, BILITOT, PROT, ALBUMIN in the last 168 hours. No results for input(s): LIPASE, AMYLASE in the last 168 hours. No results for input(s): AMMONIA in the last 168 hours. CBC: Recent Labs  Lab 10/21/21 1425  WBC 11.3*  NEUTROABS 7.6  HGB 8.9*  HCT 29.1*  MCV 92.1  PLT 507*   Cardiac Enzymes: No results for input(s): CKTOTAL, CKMB, CKMBINDEX,  TROPONINI in the last 168 hours. BNP (last 3 results) Recent Labs    09/16/21 1405 09/30/21 1055 09/30/21 2118  BNP 3,461.4* 3,585.7* 4,037.8*    ProBNP (last 3 results) No results for input(s): PROBNP in the last 8760 hours.  CBG: Recent Labs  Lab 10/21/21 2236 10/22/21 0653 10/22/21 0731 10/22/21 1156 10/22/21 1532  GLUCAP 118* 96 106* 109* 150*       Radiology Reports  DG Knee Complete 4 Views Right  Result Date: 10/21/2021 CLINICAL DATA:  Knee pain after a fall yesterday. EXAM: RIGHT KNEE - COMPLETE 4+ VIEW COMPARISON:  None. FINDINGS: There is a nondisplaced fracture of the lower pole of the patella. There is mild overlying soft tissue swelling. There is no dislocation, and no sizable knee joint effusion is evident. Femorotibial joint space widths are preserved. Extensive atherosclerotic vascular calcifications are noted. IMPRESSION: Nondisplaced patellar fracture. Electronically Signed   By: Logan Bores M.D.   On: 10/21/2021 08:32   DG Foot Complete Right  Result Date: 10/21/2021 CLINICAL DATA:  Fall yesterday.  Toe pain and discoloration. EXAM: RIGHT FOOT COMPLETE - 3+ VIEW COMPARISON:  09/22/2021 FINDINGS: No acute fracture or dislocation is identified. No osseous erosion is evident. Extensive atherosclerotic vascular calcifications are again noted. IMPRESSION: No acute osseous abnormality identified. Electronically Signed   By: Logan Bores M.D.   On: 10/21/2021 08:35   VAS [place] ABI WITH/WO TBI  Result Date: 10/22/2021  LOWER EXTREMITY DOPPLER STUDY Patient Name:  Tammy Moses  Date of Exam:   10/22/2021 Medical Rec #: [id number]       Accession #:    [id number] Date of Birth: 1966/08/01       Patient Gender: F Patient Age:   35 years Exam Location:  [facility] Procedure:      VAS [place] ABI WITH/WO TBI Referring Phys: MICHAEL JEFFERY --------------------------------------------------------------------------------  Indications: Ulceration. High Risk Factors:  Hypertension, Diabetes.  Limitations: Today's exam was limited due to Left HD access. Comparison Study: No prior studies. Performing Technologist: Carlos Levering RVT  Examination Guidelines: A complete evaluation includes at minimum, Doppler waveform signals and systolic blood pressure reading at the level of bilateral brachial, anterior tibial, and posterior tibial arteries, when vessel segments are accessible. Bilateral testing is considered an integral part of a complete examination. Photoelectric Plethysmograph (PPG) waveforms and toe systolic pressure readings are included as required and additional duplex testing as needed. Limited examinations for reoccurring indications may be performed as noted.  ABI Findings: +---------+------------------+-----+----------+--------------------------------+  Right     Rt Pressure (mmHg) Index Waveform   Comment                           +---------+------------------+-----+----------+--------------------------------+  Brachial  188  triphasic  Pressure was taken from BP                                                       monitor. Waveforms were                                                          noncompressible with ABI                                                         equipment.                        +---------+------------------+-----+----------+--------------------------------+  PTA       43                 0.23  monophasic                                   +---------+------------------+-----+----------+--------------------------------+  DP        65                 0.35  monophasic                                   +---------+------------------+-----+----------+--------------------------------+  Great Toe                          Absent                                       +---------+------------------+-----+----------+--------------------------------+ +---------+------------------+-----+----------+---------+  Left      Lt Pressure  (mmHg) Index Waveform   Comment    +---------+------------------+-----+----------+---------+  Brachial                                      HD Access  +---------+------------------+-----+----------+---------+  PTA       254                1.35  monophasic            +---------+------------------+-----+----------+---------+  DP        254                1.35  monophasic            +---------+------------------+-----+----------+---------+  Great Toe                          Absent                +---------+------------------+-----+----------+---------+ +-------+-----------+-----------+------------+------------+  ABI/TBI Today's ABI Today's TBI Previous ABI Previous TBI  +-------+-----------+-----------+------------+------------+  Right   0.35                                               +-------+-----------+-----------+------------+------------+  Left    [place]                                                 +-------+-----------+-----------+------------+------------+  The right brachial pressure was obtained from the patient's BP cuff. Right brachial pressure was noncompressible with ABI cuff. Unable to obtain left pressure due to HD access.  Summary: Right: Resting right ankle-brachial index indicates severe right lower extremity arterial disease. The right toe-brachial index is abnormal. Left: Resting left ankle-brachial index indicates noncompressible left lower extremity arteries. The left toe-brachial index is abnormal.  *See table(s) above for measurements and observations.     Preliminary        Antibiotics: Anti-infectives (From admission, onward)    None         DVT prophylaxis: Heparin  Code Status: Full code  Family Communication: No family at bedside   Consultants: Nephrology  Procedures:     Objective    Physical Examination:   General-appears in no acute distress Heart-S1-S2, regular, no murmur auscultated Lungs-clear to auscultation bilaterally, no wheezing or crackles  auscultated Abdomen-soft, nontender, no organomegaly Extremities-black discoloration noted  in right big toe, dorsalis pedis 2+ bilaterally, nontender, no erythema noted Neuro-alert, oriented x3, no focal deficit noted  Status is: Inpatient  Dispo: The patient is from: Home              Anticipated d/c is to: Home              Anticipated d/c date is: 1-2 80              Patient currently not stable for discharge  Barrier to discharge-ongoing evaluation for gangrene big toe  COVID-19 Labs  No results for input(s): DDIMER, FERRITIN, LDH, CRP in the last 72 hours.  Lab Results  Component Value Date   SARSCOV2NAA NEGATIVE 10/21/2021   [place] NEGATIVE 10/07/2021   [place] NEGATIVE 09/30/2021   [place] NEGATIVE 09/16/2021            Recent Results (from the past 240 hour(s))  Resp Panel by RT-PCR (Flu A&B, Covid) Nasopharyngeal Swab     Status: None   Collection Time: 10/21/21  2:51 PM   Specimen: Nasopharyngeal Swab; Nasopharyngeal(NP) swabs in vial transport medium  Result Value Ref Range Status   SARS Coronavirus 2 by RT PCR NEGATIVE NEGATIVE Final    Comment: (NOTE) SARS-CoV-2 target nucleic acids are NOT DETECTED.  The SARS-CoV-2 RNA is generally detectable in upper respiratory specimens during the acute phase of infection. The lowest concentration of SARS-CoV-2 viral copies this assay can detect is 138 copies/mL. A negative result does not preclude SARS-Cov-2 infection and should not be used as the sole basis for treatment or other patient management decisions. A negative result may occur with  improper specimen collection/handling, submission of specimen other than nasopharyngeal swab, presence of viral mutation(s) within the areas targeted by this assay, and inadequate number of viral copies(<138 copies/mL). A negative result must be combined with clinical observations, patient history, and epidemiological information. The expected result is  Negative.  Fact Sheet for Patients:  EntrepreneurPulse.com.au  Fact Sheet for Healthcare Providers:  IncredibleEmployment.be  This test is no t yet approved or cleared by the [place] FDA and  has been authorized for detection and/or diagnosis of SARS-CoV-2 by FDA under an Emergency Use Authorization (EUA). This EUA will remain  in effect (meaning this test can be used) for the duration of the COVID-19 declaration under Section 564(b)(1) of the Act, 21 U.S.C.section 360bbb-3(b)(1), unless the authorization is terminated  or revoked sooner.       Influenza A by PCR NEGATIVE NEGATIVE Final   Influenza B by PCR NEGATIVE NEGATIVE Final    Comment: (NOTE) The Xpert Xpress SARS-CoV-2/FLU/RSV plus assay is intended as an aid in the diagnosis of influenza from Nasopharyngeal swab specimens and should not be used as a sole basis for treatment. Nasal washings and aspirates are unacceptable for Xpert Xpress SARS-CoV-2/FLU/RSV testing.  Fact Sheet for Patients: EntrepreneurPulse.com.au  Fact Sheet for Healthcare Providers: IncredibleEmployment.be  This test is not yet approved or cleared by the [place] FDA and has been authorized for detection and/or diagnosis of SARS-CoV-2 by FDA under an Emergency Use Authorization (EUA). This EUA will remain in effect (meaning this test can be used) for the duration of the COVID-19 declaration under Section 564(b)(1) of the Act, 21 U.S.C. section 360bbb-3(b)(1), unless the authorization is terminated or revoked.  Performed at [facility], [place] [address], [place], [place] [address]     [facility] If 7PM-7AM, please contact night-coverage at [email or web address], Office  [phone]   10/22/2021, 5:49 PM  LOS: 0 days

## 2021-10-23 DIAGNOSIS — H402223 Chronic angle-closure glaucoma, left eye, severe stage: Secondary | ICD-10-CM | POA: Diagnosis not present

## 2021-10-23 DIAGNOSIS — S82091D Other fracture of right patella, subsequent encounter for closed fracture with routine healing: Secondary | ICD-10-CM | POA: Diagnosis not present

## 2021-10-23 DIAGNOSIS — E785 Hyperlipidemia, unspecified: Secondary | ICD-10-CM | POA: Diagnosis not present

## 2021-10-23 DIAGNOSIS — I10 Essential (primary) hypertension: Secondary | ICD-10-CM | POA: Diagnosis not present

## 2021-10-23 DIAGNOSIS — E1151 Type 2 diabetes mellitus with diabetic peripheral angiopathy without gangrene: Secondary | ICD-10-CM | POA: Diagnosis not present

## 2021-10-23 DIAGNOSIS — M25561 Pain in right knee: Secondary | ICD-10-CM | POA: Diagnosis present

## 2021-10-23 DIAGNOSIS — S82091A Other fracture of right patella, initial encounter for closed fracture: Secondary | ICD-10-CM | POA: Diagnosis not present

## 2021-10-23 DIAGNOSIS — D62 Acute posthemorrhagic anemia: Secondary | ICD-10-CM | POA: Diagnosis not present

## 2021-10-23 DIAGNOSIS — K219 Gastro-esophageal reflux disease without esophagitis: Secondary | ICD-10-CM | POA: Diagnosis present

## 2021-10-23 DIAGNOSIS — S82001D Unspecified fracture of right patella, subsequent encounter for closed fracture with routine healing: Secondary | ICD-10-CM | POA: Diagnosis not present

## 2021-10-23 DIAGNOSIS — Z01810 Encounter for preprocedural cardiovascular examination: Secondary | ICD-10-CM | POA: Diagnosis not present

## 2021-10-23 DIAGNOSIS — W19XXXA Unspecified fall, initial encounter: Secondary | ICD-10-CM | POA: Diagnosis not present

## 2021-10-23 DIAGNOSIS — S82011A Displaced osteochondral fracture of right patella, initial encounter for closed fracture: Secondary | ICD-10-CM | POA: Diagnosis present

## 2021-10-23 DIAGNOSIS — R404 Transient alteration of awareness: Secondary | ICD-10-CM | POA: Diagnosis not present

## 2021-10-23 DIAGNOSIS — E118 Type 2 diabetes mellitus with unspecified complications: Secondary | ICD-10-CM | POA: Diagnosis not present

## 2021-10-23 DIAGNOSIS — Z79899 Other long term (current) drug therapy: Secondary | ICD-10-CM | POA: Diagnosis not present

## 2021-10-23 DIAGNOSIS — D638 Anemia in other chronic diseases classified elsewhere: Secondary | ICD-10-CM | POA: Diagnosis not present

## 2021-10-23 DIAGNOSIS — S82001A Unspecified fracture of right patella, initial encounter for closed fracture: Secondary | ICD-10-CM | POA: Diagnosis not present

## 2021-10-23 DIAGNOSIS — H548 Legal blindness, as defined in USA: Secondary | ICD-10-CM | POA: Diagnosis present

## 2021-10-23 DIAGNOSIS — I70203 Unspecified atherosclerosis of native arteries of extremities, bilateral legs: Secondary | ICD-10-CM | POA: Diagnosis not present

## 2021-10-23 DIAGNOSIS — W19XXXD Unspecified fall, subsequent encounter: Secondary | ICD-10-CM | POA: Diagnosis not present

## 2021-10-23 DIAGNOSIS — Z9115 Patient's noncompliance with renal dialysis: Secondary | ICD-10-CM | POA: Diagnosis not present

## 2021-10-23 DIAGNOSIS — R29898 Other symptoms and signs involving the musculoskeletal system: Secondary | ICD-10-CM | POA: Diagnosis not present

## 2021-10-23 DIAGNOSIS — L819 Disorder of pigmentation, unspecified: Secondary | ICD-10-CM | POA: Diagnosis not present

## 2021-10-23 DIAGNOSIS — M21371 Foot drop, right foot: Secondary | ICD-10-CM | POA: Diagnosis not present

## 2021-10-23 DIAGNOSIS — I2510 Atherosclerotic heart disease of native coronary artery without angina pectoris: Secondary | ICD-10-CM | POA: Diagnosis present

## 2021-10-23 DIAGNOSIS — N2581 Secondary hyperparathyroidism of renal origin: Secondary | ICD-10-CM | POA: Diagnosis present

## 2021-10-23 DIAGNOSIS — I739 Peripheral vascular disease, unspecified: Secondary | ICD-10-CM | POA: Diagnosis not present

## 2021-10-23 DIAGNOSIS — J810 Acute pulmonary edema: Secondary | ICD-10-CM | POA: Diagnosis not present

## 2021-10-23 DIAGNOSIS — N250 Renal osteodystrophy: Secondary | ICD-10-CM | POA: Diagnosis not present

## 2021-10-23 DIAGNOSIS — Z992 Dependence on renal dialysis: Secondary | ICD-10-CM | POA: Diagnosis not present

## 2021-10-23 DIAGNOSIS — G9009 Other idiopathic peripheral autonomic neuropathy: Secondary | ICD-10-CM | POA: Diagnosis not present

## 2021-10-23 DIAGNOSIS — R5381 Other malaise: Secondary | ICD-10-CM | POA: Diagnosis not present

## 2021-10-23 DIAGNOSIS — S82009A Unspecified fracture of unspecified patella, initial encounter for closed fracture: Secondary | ICD-10-CM | POA: Diagnosis not present

## 2021-10-23 DIAGNOSIS — I70235 Atherosclerosis of native arteries of right leg with ulceration of other part of foot: Secondary | ICD-10-CM | POA: Diagnosis not present

## 2021-10-23 DIAGNOSIS — F17210 Nicotine dependence, cigarettes, uncomplicated: Secondary | ICD-10-CM | POA: Diagnosis not present

## 2021-10-23 DIAGNOSIS — S82091G Other fracture of right patella, subsequent encounter for closed fracture with delayed healing: Secondary | ICD-10-CM | POA: Diagnosis not present

## 2021-10-23 DIAGNOSIS — J449 Chronic obstructive pulmonary disease, unspecified: Secondary | ICD-10-CM | POA: Diagnosis not present

## 2021-10-23 DIAGNOSIS — I96 Gangrene, not elsewhere classified: Secondary | ICD-10-CM | POA: Diagnosis not present

## 2021-10-23 DIAGNOSIS — H409 Unspecified glaucoma: Secondary | ICD-10-CM | POA: Diagnosis present

## 2021-10-23 DIAGNOSIS — I120 Hypertensive chronic kidney disease with stage 5 chronic kidney disease or end stage renal disease: Secondary | ICD-10-CM | POA: Diagnosis not present

## 2021-10-23 DIAGNOSIS — E11319 Type 2 diabetes mellitus with unspecified diabetic retinopathy without macular edema: Secondary | ICD-10-CM | POA: Diagnosis present

## 2021-10-23 DIAGNOSIS — L97519 Non-pressure chronic ulcer of other part of right foot with unspecified severity: Secondary | ICD-10-CM | POA: Diagnosis not present

## 2021-10-23 DIAGNOSIS — B1920 Unspecified viral hepatitis C without hepatic coma: Secondary | ICD-10-CM | POA: Diagnosis present

## 2021-10-23 DIAGNOSIS — E559 Vitamin D deficiency, unspecified: Secondary | ICD-10-CM | POA: Diagnosis not present

## 2021-10-23 DIAGNOSIS — W000XXA Fall on same level due to ice and snow, initial encounter: Secondary | ICD-10-CM | POA: Diagnosis present

## 2021-10-23 DIAGNOSIS — R230 Cyanosis: Secondary | ICD-10-CM | POA: Diagnosis not present

## 2021-10-23 DIAGNOSIS — Z20822 Contact with and (suspected) exposure to covid-19: Secondary | ICD-10-CM | POA: Diagnosis not present

## 2021-10-23 DIAGNOSIS — Z794 Long term (current) use of insulin: Secondary | ICD-10-CM | POA: Diagnosis not present

## 2021-10-23 DIAGNOSIS — B182 Chronic viral hepatitis C: Secondary | ICD-10-CM | POA: Diagnosis present

## 2021-10-23 DIAGNOSIS — E11621 Type 2 diabetes mellitus with foot ulcer: Secondary | ICD-10-CM | POA: Diagnosis not present

## 2021-10-23 DIAGNOSIS — I70261 Atherosclerosis of native arteries of extremities with gangrene, right leg: Secondary | ICD-10-CM | POA: Diagnosis not present

## 2021-10-23 DIAGNOSIS — E1152 Type 2 diabetes mellitus with diabetic peripheral angiopathy with gangrene: Secondary | ICD-10-CM | POA: Diagnosis not present

## 2021-10-23 DIAGNOSIS — I080 Rheumatic disorders of both mitral and aortic valves: Secondary | ICD-10-CM | POA: Diagnosis present

## 2021-10-23 DIAGNOSIS — Z7401 Bed confinement status: Secondary | ICD-10-CM | POA: Diagnosis not present

## 2021-10-23 DIAGNOSIS — E119 Type 2 diabetes mellitus without complications: Secondary | ICD-10-CM | POA: Diagnosis not present

## 2021-10-23 DIAGNOSIS — D631 Anemia in chronic kidney disease: Secondary | ICD-10-CM | POA: Diagnosis not present

## 2021-10-23 DIAGNOSIS — R410 Disorientation, unspecified: Secondary | ICD-10-CM | POA: Diagnosis not present

## 2021-10-23 DIAGNOSIS — H44511 Absolute glaucoma, right eye: Secondary | ICD-10-CM | POA: Diagnosis not present

## 2021-10-23 DIAGNOSIS — R011 Cardiac murmur, unspecified: Secondary | ICD-10-CM | POA: Diagnosis not present

## 2021-10-23 DIAGNOSIS — N186 End stage renal disease: Secondary | ICD-10-CM | POA: Diagnosis not present

## 2021-10-23 DIAGNOSIS — E1122 Type 2 diabetes mellitus with diabetic chronic kidney disease: Secondary | ICD-10-CM | POA: Diagnosis not present

## 2021-10-23 DIAGNOSIS — I959 Hypotension, unspecified: Secondary | ICD-10-CM | POA: Diagnosis not present

## 2021-10-23 LAB — RENAL FUNCTION PANEL
Albumin: 2.8 g/dL — ABNORMAL LOW (ref 3.5–5.0)
Anion gap: 13 (ref 5–15)
BUN: 62 mg/dL — ABNORMAL HIGH (ref 6–20)
CO2: 23 mmol/L (ref 22–32)
Calcium: 8.8 mg/dL — ABNORMAL LOW (ref 8.9–10.3)
Chloride: 98 mmol/L (ref 98–111)
Creatinine, Ser: 11.99 mg/dL — ABNORMAL HIGH (ref 0.44–1.00)
GFR, Estimated: 3 mL/min — ABNORMAL LOW (ref >60)
Glucose, Bld: 143 mg/dL — ABNORMAL HIGH (ref 70–99)
Phosphorus: 6.1 mg/dL — ABNORMAL HIGH (ref 2.5–4.6)
Potassium: 4.5 mmol/L (ref 3.5–5.1)
Sodium: 134 mmol/L — ABNORMAL LOW (ref 135–145)

## 2021-10-23 LAB — CBC
HCT: 22.2 % — ABNORMAL LOW (ref 36.0–46.0)
Hemoglobin: 7 g/dL — ABNORMAL LOW (ref 12.0–15.0)
MCH: 28.8 pg (ref 26.0–34.0)
MCHC: 31.5 g/dL (ref 30.0–36.0)
MCV: 91.4 fL (ref 80.0–100.0)
Platelets: 354 10*3/uL (ref 150–400)
RBC: 2.43 MIL/uL — ABNORMAL LOW (ref 3.87–5.11)
RDW: 16.1 % — ABNORMAL HIGH (ref 11.5–15.5)
WBC: 7.4 10*3/uL (ref 4.0–10.5)
nRBC: 0 % (ref 0.0–0.2)

## 2021-10-23 LAB — GLUCOSE, CAPILLARY
Glucose-Capillary: 82 mg/dL (ref 70–99)
Glucose-Capillary: 74 mg/dL (ref 70–99)
Glucose-Capillary: 209 mg/dL — ABNORMAL HIGH (ref 70–99)

## 2021-10-23 LAB — IRON AND TIBC
Iron: 15 ug/dL — ABNORMAL LOW (ref 28–170)
Saturation Ratios: 7 % — ABNORMAL LOW (ref 10.4–31.8)
TIBC: 204 ug/dL — ABNORMAL LOW (ref 250–450)
UIBC: 189 ug/dL

## 2021-10-23 LAB — HEPATITIS B SURFACE ANTIBODY,QUALITATIVE: Hep B S Ab: REACTIVE — AB

## 2021-10-23 LAB — HEPATITIS B SURFACE ANTIGEN: Hepatitis B Surface Ag: NONREACTIVE

## 2021-10-23 LAB — FERRITIN: Ferritin: 742 ng/mL — ABNORMAL HIGH (ref 11–307)

## 2021-10-23 MED ORDER — SODIUM CHLORIDE 0.9 % IV SOLN
250.0000 mg | Freq: Every day | INTRAVENOUS | Status: AC
  Administered 2021-10-23 – 2021-10-26 (×4): 250 mg via INTRAVENOUS
  Filled 2021-10-23 (×4): qty 20

## 2021-10-23 MED ORDER — SODIUM CHLORIDE 0.9 % IV SOLN: 100.0000 mL | INTRAVENOUS | Status: DC | PRN

## 2021-10-23 MED ORDER — LIDOCAINE-PRILOCAINE 2.5-2.5 % EX CREA
1.0000 "application " | TOPICAL_CREAM | CUTANEOUS | Status: DC | PRN
  Filled 2021-10-23: qty 5

## 2021-10-23 MED ORDER — LIDOCAINE HCL (PF) 1 % IJ SOLN
5.0000 mL | INTRAMUSCULAR | Status: DC | PRN
  Filled 2021-10-23: qty 5

## 2021-10-23 MED ORDER — ALTEPLASE 2 MG IJ SOLR: 2.0000 mg | Freq: Once | INTRAMUSCULAR | Status: DC | PRN

## 2021-10-23 MED ORDER — HEPARIN SODIUM (PORCINE) 1000 UNIT/ML DIALYSIS: 1000.0000 [IU] | INTRAMUSCULAR | Status: DC | PRN

## 2021-10-23 MED ORDER — PENTAFLUOROPROP-TETRAFLUOROETH EX AERO: 1.0000 "application " | INHALATION_SPRAY | CUTANEOUS | Status: DC | PRN

## 2021-10-23 NOTE — Progress Notes (Signed)
Mobility Specialist Progress Note   10/23/21 1720  Mobility  Activity Transferred to/from [facility]  Level of Assistance Minimal assist, patient does 75% or more  RLE Weight Bearing NWB  Distance Ambulated (ft) 2 ft  Mobility Response Tolerated fair  Mobility performed by Mobility specialist;Nurse tech  $Mobility charge 1 Mobility   Pt needing assistance from [facility] to bed d/t vision impairment, general weakness and cognitive deficit. Requiring heavy VC for hand placement and spatial awareness and min G for physical assist. Returned back to bed w/ call bell in reach and all needs met.   Holland Falling Mobility Specialist Phone Number [phone]

## 2021-10-23 NOTE — Progress Notes (Signed)
[facility]  PROGRESS NOTE  Tammy Moses JAS:[id number] DOB: [birth year] DOA: 10/21/2021 PCP: Tammy Glatter, FNP   Brief HPI:   55 year old female with medical history of ESRD on HD MWF, hypertension, normocytic anemia 622 CKD, COPD, blindness, tobacco abuse presented with persistent right knee pain.  Patient fell down yesterday after completing HD session when she slipped on ice and fell on her right knee.  She was able to stand up but experienced pain and swelling of right knee with minimal movement.  In the ED she was found to have nondisplaced right patella fracture on x-ray. Orthopedic surgery Tammy Odor, PA was contacted by ED provider and they recommended right knee brace with physical therapy and follow-up with orthopedics as outpatient. She was also found to have right toe gangrene, ABIs were ordered which showed severe PAD on right.   Subjective   Patient seen during hemodialysis session.  No new complaints.  Appreciate vascular surgery input.  Plan for amputation of right big toe per vascular surgery.   Assessment/Plan:    Right knee patella nondisplaced fracture -Orthopedics, Tammy Odor, PA was contacted by ED provider -Recommended knee immobilizer and follow-up for patella fracture treatment as outpatient -Knee immobilizer has been placed -Likely will need skilled nursing facility placement -PT consulted  Right big toe discoloration/gangrene -ABIs obtained today shows severe PAD on right -Vascular surgery was consulted -Ordered CT angiogram -Plan for amputation of right big toe  Hypertension -Blood pressure is still elevated elevated even after adding -Continue Home medication including amlodipine, furosemide, hydralazine  -Nephrology planning to start ARB   ESRD on hemodialysis -Patient on hemodialysis MWF -Nephrology following  Diabetes mellitus type 2 -CBG well controlled -Continue sliding scale insulin with NovoLog  Anemia of chronic  disease -Secondary to ESRD -Hemoglobin is 7 -IV iron ordered per nephrology -Right required blood transfusion -Follow hemoglobin in a.m.  Medications     amLODipine  10 mg Oral Daily   atropine  1 drop Both Eyes BID   buPROPion  150 mg Oral Daily   calcium acetate  2,001 mg Oral TID WC   Chlorhexidine Gluconate Cloth  6 each Topical Q0600   dorzolamide-timolol  1 drop Both Eyes BID   feeding supplement (NEPRO CARB STEADY)  237 mL Oral BID BM   gabapentin  300 mg Oral q1800   heparin  5,000 Units Subcutaneous Q12H   hydrALAZINE  100 mg Oral Q8H   insulin aspart  0-6 Units Subcutaneous TID WC   labetalol  200 mg Oral BID   lamoTRIgine  25 mg Oral Daily   latanoprost  1 drop Both Eyes QHS   multivitamin  1 tablet Oral QHS   pravastatin  80 mg Oral Daily     Data Reviewed:   CBG:  Recent Labs  Lab 10/22/21 0731 10/22/21 1156 10/22/21 1532 10/22/21 2018 10/23/21 0748  GLUCAP 106* 109* 150* 134* 82    SpO2: 98 %    Vitals:   10/23/21 1400 10/23/21 1430 10/23/21 1500 10/23/21 1530  BP: (!) 141/42 (!) 183/46 (!) 201/69 (!) 206/70  Pulse: 80 78 79 80  Resp:      Temp:      TempSrc:      SpO2:      Weight:         Intake/Output Summary (Last 24 hours) at 10/23/2021 1608 Last data filed at 10/22/2021 2100 Gross per 24 hour  Intake 120 ml  Output --  Net 120 ml  12/28 1901 - 12/30 0700 In: 480 [P.O.:480] Out: -   Filed Weights   10/23/21 1135  Weight: 56.8 kg    Data Reviewed: Basic Metabolic Panel: Recent Labs  Lab 10/21/21 1425 10/22/21 0309 10/23/21 1001  NA 137 134* 134*  K 4.5 4.9 4.5  CL 98 99 98  CO2 24 20* 23  GLUCOSE 106* 79 143*  BUN 49* 53* 62*  CREATININE 9.29* 10.16* 11.99*  CALCIUM 9.4 8.7* 8.8*  PHOS  --   --  6.1*   Liver Function Tests: Recent Labs  Lab 10/23/21 1001  ALBUMIN 2.8*   No results for input(s): LIPASE, AMYLASE in the last 168 hours. No results for input(s): AMMONIA in the last 168  hours. CBC: Recent Labs  Lab 10/21/21 1425 10/23/21 1001  WBC 11.3* 7.4  NEUTROABS 7.6  --   HGB 8.9* 7.0*  HCT 29.1* 22.2*  MCV 92.1 91.4  PLT 507* 354   Cardiac Enzymes: No results for input(s): CKTOTAL, CKMB, CKMBINDEX, TROPONINI in the last 168 hours. BNP (last 3 results) Recent Labs    09/16/21 1405 09/30/21 1055 09/30/21 2118  BNP 3,461.4* 3,585.7* 4,037.8*    ProBNP (last 3 results) No results for input(s): PROBNP in the last 8760 hours.  CBG: Recent Labs  Lab 10/22/21 0731 10/22/21 1156 10/22/21 1532 10/22/21 2018 10/23/21 0748  GLUCAP 106* 109* 150* 134* 82       Radiology Reports  VAS [place] ABI WITH/WO TBI  Result Date: 10/22/2021  LOWER EXTREMITY DOPPLER STUDY Patient Name:  Tammy Moses  Date of Exam:   10/22/2021 Medical Rec #: [id number]       Accession #:    [id number] Date of Birth: [birth year]       Patient Gender: F Patient Age:   65 years Exam Location:  [facility] Procedure:      VAS [place] ABI WITH/WO TBI Referring Phys: MICHAEL JEFFERY --------------------------------------------------------------------------------  Indications: Ulceration. High Risk Factors: Hypertension, Diabetes.  Limitations: Today's exam was limited due to Left HD access. Comparison Study: No prior studies. Performing Technologist: Carlos Levering RVT  Examination Guidelines: A complete evaluation includes at minimum, Doppler waveform signals and systolic blood pressure reading at the level of bilateral brachial, anterior tibial, and posterior tibial arteries, when vessel segments are accessible. Bilateral testing is considered an integral part of a complete examination. Photoelectric Plethysmograph (PPG) waveforms and toe systolic pressure readings are included as required and additional duplex testing as needed. Limited examinations for reoccurring indications may be performed as noted.  ABI Findings:  +---------+------------------+-----+----------+--------------------------------+  Right     Rt Pressure (mmHg) Index Waveform   Comment                           +---------+------------------+-----+----------+--------------------------------+  Brachial  188                      triphasic  Pressure was taken from BP                                                       monitor. Waveforms were  noncompressible with ABI                                                         equipment.                        +---------+------------------+-----+----------+--------------------------------+  PTA       43                 0.23  monophasic                                   +---------+------------------+-----+----------+--------------------------------+  DP        65                 0.35  monophasic                                   +---------+------------------+-----+----------+--------------------------------+  Great Toe                          Absent                                       +---------+------------------+-----+----------+--------------------------------+ +---------+------------------+-----+----------+---------+  Left      Lt Pressure (mmHg) Index Waveform   Comment    +---------+------------------+-----+----------+---------+  Brachial                                      HD Access  +---------+------------------+-----+----------+---------+  PTA       254                1.35  monophasic            +---------+------------------+-----+----------+---------+  DP        254                1.35  monophasic            +---------+------------------+-----+----------+---------+  Great Toe                          Absent                +---------+------------------+-----+----------+---------+ +-------+-----------+-----------+------------+------------+  ABI/TBI Today's ABI Today's TBI Previous ABI Previous TBI   +-------+-----------+-----------+------------+------------+  Right   0.35                                               +-------+-----------+-----------+------------+------------+  Left    [place]                                                 +-------+-----------+-----------+------------+------------+ The right brachial pressure was obtained from the patient's BP cuff. Right brachial  pressure was noncompressible with ABI cuff. Unable to obtain left pressure due to HD access.  Summary: Right: Resting right ankle-brachial index indicates severe right lower extremity arterial disease. The right toe-brachial index is abnormal. Left: Resting left ankle-brachial index indicates noncompressible left lower extremity arteries. The left toe-brachial index is abnormal.  *See table(s) above for measurements and observations.  Electronically signed by Harold Barban MD on 10/22/2021 at 7:59:49 PM.    Final        Antibiotics: Anti-infectives (From admission, onward)    None         DVT prophylaxis: Heparin  Code Status: Full code  Family Communication: No family at bedside   Consultants: Nephrology  Procedures:     Objective    Physical Examination:   General-appears in no acute distress Heart-S1-S2, regular, no murmur auscultated Lungs-clear to auscultation bilaterally, no wheezing or crackles auscultated Abdomen-soft, nontender, no organomegaly Extremities-no edema in the lower extremities, bluish discoloration in right big toe Neuro-alert, oriented x3, no focal deficit noted  Status is: Inpatient  Dispo: The patient is from: Home              Anticipated d/c is to: Home              Anticipated d/c date is: 1-2 37              Patient currently not stable for discharge  Barrier to discharge-ongoing evaluation for gangrene big toe  COVID-19 Labs  Recent Labs    10/23/21 1001  FERRITIN 742*    Lab Results  Component Value Date   SARSCOV2NAA NEGATIVE 10/21/2021    [place] NEGATIVE 10/07/2021   [place] NEGATIVE 09/30/2021   [place] NEGATIVE 09/16/2021            Recent Results (from the past 240 hour(s))  Resp Panel by RT-PCR (Flu A&B, Covid) Nasopharyngeal Swab     Status: None   Collection Time: 10/21/21  2:51 PM   Specimen: Nasopharyngeal Swab; Nasopharyngeal(NP) swabs in vial transport medium  Result Value Ref Range Status   SARS Coronavirus 2 by RT PCR NEGATIVE NEGATIVE Final    Comment: (NOTE) SARS-CoV-2 target nucleic acids are NOT DETECTED.  The SARS-CoV-2 RNA is generally detectable in upper respiratory specimens during the acute phase of infection. The lowest concentration of SARS-CoV-2 viral copies this assay can detect is 138 copies/mL. A negative result does not preclude SARS-Cov-2 infection and should not be used as the sole basis for treatment or other patient management decisions. A negative result may occur with  improper specimen collection/handling, submission of specimen other than nasopharyngeal swab, presence of viral mutation(s) within the areas targeted by this assay, and inadequate number of viral copies(<138 copies/mL). A negative result must be combined with clinical observations, patient history, and epidemiological information. The expected result is Negative.  Fact Sheet for Patients:  EntrepreneurPulse.com.au  Fact Sheet for Healthcare Providers:  IncredibleEmployment.be  This test is no t yet approved or cleared by the [place] FDA and  has been authorized for detection and/or diagnosis of SARS-CoV-2 by FDA under an Emergency Use Authorization (EUA). This EUA will remain  in effect (meaning this test can be used) for the duration of the COVID-19 declaration under Section 564(b)(1) of the Act, 21 U.S.C.section 360bbb-3(b)(1), unless the authorization is terminated  or revoked sooner.       Influenza A by PCR NEGATIVE NEGATIVE Final   Influenza B  by PCR NEGATIVE NEGATIVE Final    Comment: (  NOTE) The Xpert Xpress SARS-CoV-2/FLU/RSV plus assay is intended as an aid in the diagnosis of influenza from Nasopharyngeal swab specimens and should not be used as a sole basis for treatment. Nasal washings and aspirates are unacceptable for Xpert Xpress SARS-CoV-2/FLU/RSV testing.  Fact Sheet for Patients: EntrepreneurPulse.com.au  Fact Sheet for Healthcare Providers: IncredibleEmployment.be  This test is not yet approved or cleared by the [place] FDA and has been authorized for detection and/or diagnosis of SARS-CoV-2 by FDA under an Emergency Use Authorization (EUA). This EUA will remain in effect (meaning this test can be used) for the duration of the COVID-19 declaration under Section 564(b)(1) of the Act, 21 U.S.C. section 360bbb-3(b)(1), unless the authorization is terminated or revoked.  Performed at [facility], [place] [address], [place], [place] [address]     [facility] If 7PM-7AM, please contact night-coverage at [email or web address], Office  [phone]   10/23/2021, 4:08 PM  LOS: 0 days

## 2021-10-23 NOTE — Progress Notes (Addendum)
Nephrology Follow-Up Consult note   Assessment/Recommendations: Tammy Moses is a/an 55 y.o. female with a past medical history significant for DM2, HTN, COPD, admitted for patellar fracture.     Patellar fracture: In immobilizer.  Management per primary team  ESRD: previously at [facility] now [facility] per schedule today.  Refused dialysis on 12/29 -Plan for dialysis today and likely maintain MWF schedule for now -On higher dose of gabapentin than what is recommended for ESRD.  Have decreased by 50% already.  We will continue to try and decrease down to 300 mg daily as tolerated  Hypertension/volume: Blood pressure fairly high.  Volume removal with dialysis.  Adjustment of blood pressure medications after dialysis.  Currently on amlodipine 10 mg daily and hydralazine 100 mg 3 times daily.  We will follow-up blood pressure after dialysis today and likely add an ARB.  Anemia secondary to ESRD: Hgb 7, may need transfusion soon. Iron sat 7 and ferritin 742.  IV iron ordered; will hold 2 doses if transfusion is required.  Will add in aranesp once iron repleted   secondary hyperparathyroidism: Continue PhosLo.  Follow-up PTH.  Diabetes Mellitus Type 2: Continue management per primary team  Gangrenous right toe: Related to peripheral arterial disease.  Vascular surgery following   Recommendations conveyed to primary service.    [facility] 10/23/2021 11:16 AM  ___________________________________________________________  CC: fall/ patellar fx  Interval History/Subjective: Patient feels well today with no specific complaints.  She skipped dialysis yesterday because she heard she will need to toe amputation and this made her very upset.  We discussed compliance today.   Medications:  Current Facility-Administered Medications  Medication Dose Route Frequency Provider Last Rate Last Admin   0.9 %  sodium chloride infusion  100 mL Intravenous PRN  Corliss Parish, MD       0.9 %  sodium chloride infusion  100 mL Intravenous PRN Corliss Parish, MD       acetaminophen (TYLENOL) tablet 1,000 mg  1,000 mg Oral Q6H PRN Wynetta Fines T, MD       albuterol (PROVENTIL) (2.5 MG/3ML) 0.083% nebulizer solution 3 mL  3 mL Inhalation Q4H PRN Wynetta Fines T, MD       alteplase (CATHFLO ACTIVASE) injection 2 mg  2 mg Intracatheter Once PRN Corliss Parish, MD       amLODipine (NORVASC) tablet 10 mg  10 mg Oral Daily Wynetta Fines T, MD   10 mg at 10/22/21 0800   atropine 1 % ophthalmic solution 1 drop  1 drop Both Eyes BID Wynetta Fines T, MD   1 drop at 10/23/21 0814   benzonatate (TESSALON) capsule 100 mg  100 mg Oral TID PRN Vernelle Emerald, MD   100 mg at 10/23/21 0830   buPROPion (WELLBUTRIN SR) 12 hr tablet 150 mg  150 mg Oral Daily Wynetta Fines T, MD   150 mg at 10/23/21 0830   calcium acetate (PHOSLO) capsule 2,001 mg  2,001 mg Oral TID WC Wynetta Fines T, MD   2,001 mg at 10/23/21 4818   Chlorhexidine Gluconate Cloth 2 % PADS 6 each  6 each Topical Q0600 Corliss Parish, MD   6 each at 10/23/21 0832   dorzolamide-timolol (COSOPT) 22.3-6.8 MG/ML ophthalmic solution 1 drop  1 drop Both Eyes BID Wynetta Fines T, MD   1 drop at 10/23/21 5631   feeding supplement (NEPRO CARB STEADY) liquid 237 mL  237 mL Oral BID BM [place], Gagan  S, MD   237 mL at 10/22/21 1612   ferric gluconate (FERRLECIT) 250 mg in sodium chloride 0.9 % 250 mL IVPB  250 mg Intravenous Daily Reesa Chew, MD       gabapentin (NEURONTIN) capsule 300 mg  300 mg Oral q1800 Reesa Chew, MD       heparin injection 1,000 Units  1,000 Units Dialysis PRN Corliss Parish, MD       heparin injection 5,000 Units  5,000 Units Subcutaneous Q12H Wynetta Fines T, MD   5,000 Units at 10/23/21 4503   hydrALAZINE (APRESOLINE) tablet 100 mg  100 mg Oral Q8H Wynetta Fines T, MD   100 mg at 10/23/21 0535   HYDROcodone-acetaminophen (NORCO/VICODIN) 5-325 MG per tablet 1-2 tablet  1-2  tablet Oral Q6H PRN Lequita Halt, MD   1 tablet at 10/22/21 2212   HYDROmorphone (DILAUDID) injection 0.5 mg  0.5 mg Intravenous Q2H PRN Wynetta Fines T, MD   0.5 mg at 10/22/21 8882   insulin aspart (novoLOG) injection 0-6 Units  0-6 Units Subcutaneous TID WC Wynetta Fines T, MD       labetalol (NORMODYNE) tablet 200 mg  200 mg Oral BID Oswald Hillock, MD   200 mg at 10/22/21 2202   lamoTRIgine (LAMICTAL) tablet 25 mg  25 mg Oral Daily Wynetta Fines T, MD   25 mg at 10/23/21 0830   latanoprost (XALATAN) 0.005 % ophthalmic solution 1 drop  1 drop Both Eyes QHS Wynetta Fines T, MD   1 drop at 10/22/21 2201   lidocaine (PF) (XYLOCAINE) 1 % injection 5 mL  5 mL Intradermal PRN Corliss Parish, MD       lidocaine-prilocaine (EMLA) cream 1 application  1 application Topical PRN Corliss Parish, MD       metoCLOPramide (REGLAN) tablet 10 mg  10 mg Oral BID PRN Lequita Halt, MD       multivitamin (RENA-VIT) tablet 1 tablet  1 tablet Oral QHS Oswald Hillock, MD   1 tablet at 10/22/21 2202   pentafluoroprop-tetrafluoroeth (GEBAUERS) aerosol 1 application  1 application Topical PRN Corliss Parish, MD       pravastatin (PRAVACHOL) tablet 80 mg  80 mg Oral Daily Wynetta Fines T, MD   80 mg at 10/23/21 0830   senna-docusate (Senokot-S) tablet 1 tablet  1 tablet Oral QHS PRN Lequita Halt, MD   1 tablet at 10/22/21 2212      Review of Systems: 10 systems reviewed and negative except per interval history/subjective  Physical Exam: Vitals:   10/23/21 0535 10/23/21 0801  BP: (!) 157/68 (!) 177/62  Pulse: 74 81  Resp: 18 17  Temp: 98.7 F (37.1 C) 98.5 F (36.9 C)  SpO2: 98% 98%   No intake/output data recorded.  Intake/Output Summary (Last 24 hours) at 10/23/2021 1116 Last data filed at 10/22/2021 2100 Gross per 24 hour  Intake 120 ml  Output --  Net 120 ml   Constitutional: chronically ill appearing, no acute distress ENMT: ears and nose without scars or lesions, MMM CV: normal rate,  no edema Respiratory: Bilateral chest rise, normal work of breathing Gastrointestinal: soft, non-tender, no palpable masses or hernias Skin: gangrenous right toe, otherwise no visible lesions or rashes Psych: alert, judgement/insight appropriate, appropriate mood and affect   Test Results I personally reviewed new and old clinical labs and radiology tests Lab Results  Component Value Date   NA 134 (L) 10/23/2021   K 4.5 10/23/2021  CL 98 10/23/2021   CO2 23 10/23/2021   BUN 62 (H) 10/23/2021   CREATININE 11.99 (H) 10/23/2021   CALCIUM 8.8 (L) 10/23/2021   ALBUMIN 2.8 (L) 10/23/2021   PHOS 6.1 (H) 10/23/2021

## 2021-10-23 NOTE — Plan of Care (Signed)
°  Problem: Education: Goal: Verbalization of understanding the information provided (i.e., activity precautions, restrictions, etc) will improve Outcome: Progressing   Problem: Activity: Goal: Ability to ambulate and perform ADLs will improve Outcome: Progressing   Problem: Pain Management: Goal: Pain level will decrease Outcome: Progressing   Problem: Education: Goal: Knowledge of General Education information will improve Description: Including pain rating scale, medication(s)/side effects and non-pharmacologic comfort measures Outcome: Progressing   Problem: Nutrition: Goal: Adequate nutrition will be maintained Outcome: Progressing

## 2021-10-23 NOTE — Progress Notes (Signed)
PT Cancellation Note  Patient Details Name: Tammy Moses MRN: [id number] DOB: July 09, 1966   Cancelled Treatment:    Reason Eval/Treat Not Completed: Patient declined, no reason specified (Sleeping but arouses easily.  Declines PT, says she has dialysis today.  PT attempts to encourage her to get up to the chair + she says "she already did that today".) PT encourages her to do therex in bed + she states that all she needs the PT to do is adjust her blanket.  No PT rendered at this time.  Estelle Greenleaf A. Tomasina Keasling, PT, DPT Acute Rehabilitation Services Office: [place] 10/23/2021, 10:01 AM

## 2021-10-24 ENCOUNTER — Inpatient Hospital Stay (HOSPITAL_COMMUNITY): Payer: Medicare Other

## 2021-10-24 DIAGNOSIS — I96 Gangrene, not elsewhere classified: Secondary | ICD-10-CM | POA: Diagnosis not present

## 2021-10-24 DIAGNOSIS — I70261 Atherosclerosis of native arteries of extremities with gangrene, right leg: Secondary | ICD-10-CM

## 2021-10-24 DIAGNOSIS — L819 Disorder of pigmentation, unspecified: Secondary | ICD-10-CM | POA: Diagnosis not present

## 2021-10-24 DIAGNOSIS — S82001A Unspecified fracture of right patella, initial encounter for closed fracture: Secondary | ICD-10-CM | POA: Diagnosis not present

## 2021-10-24 LAB — PARATHYROID HORMONE, INTACT (NO CA): PTH: 429 pg/mL — ABNORMAL HIGH (ref 15–65)

## 2021-10-24 LAB — GLUCOSE, CAPILLARY
Glucose-Capillary: 119 mg/dL — ABNORMAL HIGH (ref 70–99)
Glucose-Capillary: 155 mg/dL — ABNORMAL HIGH (ref 70–99)
Glucose-Capillary: 74 mg/dL (ref 70–99)
Glucose-Capillary: 144 mg/dL — ABNORMAL HIGH (ref 70–99)

## 2021-10-24 LAB — HEPATITIS B SURFACE ANTIBODY, QUANTITATIVE: Hep B S AB Quant (Post): 101.1 m[IU]/mL (ref >9.9)

## 2021-10-24 MED ORDER — LOPERAMIDE HCL 2 MG PO CAPS
2.0000 mg | ORAL_CAPSULE | Freq: Once | ORAL | Status: AC
  Administered 2021-10-24: 2 mg via ORAL
  Filled 2021-10-24: qty 1

## 2021-10-24 MED ORDER — IOHEXOL 350 MG/ML SOLN
100.0000 mL | Freq: Once | INTRAVENOUS | Status: AC | PRN
  Administered 2021-10-24: 100 mL via INTRAVENOUS

## 2021-10-24 MED ORDER — DARBEPOETIN ALFA 100 MCG/0.5ML IJ SOSY
100.0000 ug | PREFILLED_SYRINGE | INTRAMUSCULAR | Status: DC
  Administered 2021-10-26 – 2021-11-02 (×2): 100 ug via INTRAVENOUS
  Filled 2021-10-24 (×4): qty 0.5

## 2021-10-24 MED ORDER — LOSARTAN POTASSIUM 50 MG PO TABS
50.0000 mg | ORAL_TABLET | Freq: Every day | ORAL | Status: DC
  Administered 2021-10-24 – 2021-10-29 (×5): 50 mg via ORAL
  Filled 2021-10-24 (×5): qty 1

## 2021-10-24 NOTE — Plan of Care (Signed)
°  Problem: Activity: Goal: Ability to ambulate and perform ADLs will improve Outcome: Progressing   Problem: Self-Concept: Goal: Ability to maintain and perform role responsibilities to the fullest extent possible will improve Outcome: Progressing

## 2021-10-24 NOTE — Progress Notes (Signed)
[facility]  PROGRESS NOTE  [name]:[id number] DOB: 05-Mar-1966 DOA: 10/21/2021 PCP: [name], FNP   Brief HPI:   55 year old female with medical history of ESRD on HD MWF, hypertension, normocytic anemia 622 CKD, COPD, blindness, tobacco abuse presented with persistent right knee pain.  Patient fell down yesterday after completing HD session when she slipped on ice and fell on her right knee.  She was able to stand up but experienced pain and swelling of right knee with minimal movement.  In the ED she was found to have nondisplaced right patella fracture on x-ray. Orthopedic surgery Hilbert Odor, PA was contacted by ED provider and they recommended right knee brace with physical therapy and follow-up with orthopedics as outpatient. She was also found to have right toe gangrene, ABIs were ordered which showed severe PAD on right.   Subjective   Patient seen and examined, complains of diarrhea.  She is afebrile, no abdominal pain, normal WBC.   Assessment/Plan:    Right knee patella nondisplaced fracture -Orthopedics, Hilbert Odor, PA was contacted by ED provider -Recommended knee immobilizer and follow-up for patella fracture treatment as outpatient -Knee immobilizer has been placed -Likely will need skilled nursing facility placement -PT consulted  Diarrhea -Unclear etiology, obtain GI pathogen panel -Unlikely C. difficile, afebrile, no abdominal pain, normal WBC -Give 1 dose of Imodium 2 mg p.o.  Right big toe discoloration/gangrene -ABIs obtained today shows severe PAD on right -Vascular surgery was consulted -Vascular surgery has ordered  CT angiogram -Plan for amputation of right big toe  Hypertension -Blood pressure has improved -Continue Home medication including amlodipine, furosemide, hydralazine  -She was started on labetalol 200 mg p.o. twice daily -Nephrology has added losartan 50 mg daily   ESRD on hemodialysis -Patient on  hemodialysis MWF -Nephrology following  Diabetes mellitus type 2 -CBG well controlled -Continue sliding scale insulin with NovoLog  Anemia of chronic disease -Secondary to ESRD -Hemoglobin is 7 -IV iron ordered per nephrology -Follow hemoglobin in a.m.  Medications     amLODipine  10 mg Oral Daily   atropine  1 drop Both Eyes BID   buPROPion  150 mg Oral Daily   calcium acetate  2,001 mg Oral TID WC   Chlorhexidine Gluconate Cloth  6 each Topical Q0600   [START ON 10/26/2021] darbepoetin (ARANESP) injection - DIALYSIS  100 mcg Intravenous Q Mon-HD   dorzolamide-timolol  1 drop Both Eyes BID   feeding supplement (NEPRO CARB STEADY)  237 mL Oral BID BM   gabapentin  300 mg Oral q1800   heparin  5,000 Units Subcutaneous Q12H   hydrALAZINE  100 mg Oral Q8H   insulin aspart  0-6 Units Subcutaneous TID WC   labetalol  200 mg Oral BID   lamoTRIgine  25 mg Oral Daily   latanoprost  1 drop Both Eyes QHS   losartan  50 mg Oral Daily   multivitamin  1 tablet Oral QHS   pravastatin  80 mg Oral Daily     Data Reviewed:   CBG:  Recent Labs  Lab 10/23/21 0748 10/23/21 1626 10/23/21 2012 10/24/21 0611 10/24/21 1120  GLUCAP 82 74 209* 119* 155*    SpO2: 99 %    Vitals:   10/23/21 1609 10/23/21 1624 10/23/21 1900 10/24/21 0903  BP: (!) 184/58 (!) 177/59 134/88 (!) 171/53  Pulse: 82 85 76 74  Resp: 16 16 14 16   Temp: 99.2 F (37.3 C) 98.8 F (37.1 C) 98.7 F (37.1  C) 98.6 F (37 C)  TempSrc: Oral Oral Oral Oral  SpO2: 98% 100% 100% 99%  Weight: 53.8 kg        Intake/Output Summary (Last 24 hours) at 10/24/2021 1409 Last data filed at 10/23/2021 1550 Gross per 24 hour  Intake --  Output 3000 ml  Net -3000 ml    12/29 1901 - 12/31 0700 In: 120 [P.O.:120] Out: 3000   Filed Weights   10/23/21 1135 10/23/21 1609  Weight: 56.8 kg 53.8 kg    Data Reviewed: Basic Metabolic Panel: Recent Labs  Lab 10/21/21 1425 10/22/21 0309 10/23/21 1001  NA 137 134*  134*  K 4.5 4.9 4.5  CL 98 99 98  CO2 24 20* 23  GLUCOSE 106* 79 143*  BUN 49* 53* 62*  CREATININE 9.29* 10.16* 11.99*  CALCIUM 9.4 8.7* 8.8*  PHOS  --   --  6.1*   Liver Function Tests: Recent Labs  Lab 10/23/21 1001  ALBUMIN 2.8*   No results for input(s): LIPASE, AMYLASE in the last 168 hours. No results for input(s): AMMONIA in the last 168 hours. CBC: Recent Labs  Lab 10/21/21 1425 10/23/21 1001  WBC 11.3* 7.4  NEUTROABS 7.6  --   HGB 8.9* 7.0*  HCT 29.1* 22.2*  MCV 92.1 91.4  PLT 507* 354   Cardiac Enzymes: No results for input(s): CKTOTAL, CKMB, CKMBINDEX, TROPONINI in the last 168 hours. BNP (last 3 results) Recent Labs    09/16/21 1405 09/30/21 1055 09/30/21 2118  BNP 3,461.4* 3,585.7* 4,037.8*    ProBNP (last 3 results) No results for input(s): PROBNP in the last 8760 hours.  CBG: Recent Labs  Lab 10/23/21 0748 10/23/21 1626 10/23/21 2012 10/24/21 0611 10/24/21 1120  GLUCAP 82 74 209* 119* 155*       Radiology Reports  No results found.     Antibiotics: Anti-infectives (From admission, onward)    None         DVT prophylaxis: Heparin  Code Status: Full code  Family Communication: No family at bedside   Consultants: Nephrology  Procedures:     Objective    Physical Examination:   General-appears in no acute distress Heart-S1-S2, regular, no murmur auscultated Lungs-clear to auscultation bilaterally, no wheezing or crackles auscultated Abdomen-soft, nontender, no organomegaly Extremities-no edema in the lower extremities, black discoloration of right big toe Neuro-alert, oriented x3, no focal deficit noted  Status is: Inpatient  Dispo: The patient is from: Home              Anticipated d/c is to: Home              Anticipated d/c date is: 1-2 50              Patient currently not stable for discharge  Barrier to discharge-ongoing evaluation for gangrene big toe  COVID-19 Labs  Recent Labs     10/23/21 1001  FERRITIN 742*    Lab Results  Component Value Date   SARSCOV2NAA NEGATIVE 10/21/2021   [place] NEGATIVE 10/07/2021   [place] NEGATIVE 09/30/2021   [place] NEGATIVE 09/16/2021            Recent Results (from the past 240 hour(s))  Resp Panel by RT-PCR (Flu A&B, Covid) Nasopharyngeal Swab     Status: None   Collection Time: 10/21/21  2:51 PM   Specimen: Nasopharyngeal Swab; Nasopharyngeal(NP) swabs in vial transport medium  Result Value Ref Range Status   SARS Coronavirus 2 by RT PCR NEGATIVE NEGATIVE  Final    Comment: (NOTE) SARS-CoV-2 target nucleic acids are NOT DETECTED.  The SARS-CoV-2 RNA is generally detectable in upper respiratory specimens during the acute phase of infection. The lowest concentration of SARS-CoV-2 viral copies this assay can detect is 138 copies/mL. A negative result does not preclude SARS-Cov-2 infection and should not be used as the sole basis for treatment or other patient management decisions. A negative result may occur with  improper specimen collection/handling, submission of specimen other than nasopharyngeal swab, presence of viral mutation(s) within the areas targeted by this assay, and inadequate number of viral copies(<138 copies/mL). A negative result must be combined with clinical observations, patient history, and epidemiological information. The expected result is Negative.  Fact Sheet for Patients:  EntrepreneurPulse.com.au  Fact Sheet for Healthcare Providers:  IncredibleEmployment.be  This test is no t yet approved or cleared by the [place] FDA and  has been authorized for detection and/or diagnosis of SARS-CoV-2 by FDA under an Emergency Use Authorization (EUA). This EUA will remain  in effect (meaning this test can be used) for the duration of the COVID-19 declaration under Section 564(b)(1) of the Act, 21 U.S.C.section 360bbb-3(b)(1), unless the  authorization is terminated  or revoked sooner.       Influenza A by PCR NEGATIVE NEGATIVE Final   Influenza B by PCR NEGATIVE NEGATIVE Final    Comment: (NOTE) The Xpert Xpress SARS-CoV-2/FLU/RSV plus assay is intended as an aid in the diagnosis of influenza from Nasopharyngeal swab specimens and should not be used as a sole basis for treatment. Nasal washings and aspirates are unacceptable for Xpert Xpress SARS-CoV-2/FLU/RSV testing.  Fact Sheet for Patients: EntrepreneurPulse.com.au  Fact Sheet for Healthcare Providers: IncredibleEmployment.be  This test is not yet approved or cleared by the [place] FDA and has been authorized for detection and/or diagnosis of SARS-CoV-2 by FDA under an Emergency Use Authorization (EUA). This EUA will remain in effect (meaning this test can be used) for the duration of the COVID-19 declaration under Section 564(b)(1) of the Act, 21 U.S.C. section 360bbb-3(b)(1), unless the authorization is terminated or revoked.  Performed at [facility], [address], [place], [place] [address]     [facility] If 7PM-7AM, please contact night-coverage at [email or web address], Office  [phone]   10/24/2021, 2:09 PM  LOS: 1 day

## 2021-10-24 NOTE — Progress Notes (Signed)
[facility]  Subjective  - no complaints.   Objective (!) 171/53 74 98.6 F (37 C) (Oral) 16 99%  Intake/Output Summary (Last 24 hours) at 10/24/2021 1126 Last data filed at 10/23/2021 1550 Gross per 24 hour  Intake --  Output 3000 ml  Net -3000 ml    Right great toe dry gangrene  Laboratory Lab Results: Recent Labs    10/21/21 1425 10/23/21 1001  WBC 11.3* 7.4  HGB 8.9* 7.0*  HCT 29.1* 22.2*  PLT 507* 354   BMET Recent Labs    10/22/21 0309 10/23/21 1001  NA 134* 134*  K 4.9 4.5  CL 99 98  CO2 20* 23  GLUCOSE 79 143*  BUN 53* 62*  CREATININE 10.16* 11.99*  CALCIUM 8.7* 8.8*    COAG Lab Results  Component Value Date   INR 1.2 10/21/2021   INR 1.2 09/16/2021   INR 0.88 01/30/2014   No results found for: PTT  Assessment/Planning:  Awaiting CT angiogram for further evaluation of arterial insufficiency given dry gangrene of the right great toe and unable to appreciate femoral pulse.  Further recommendations to follow.  Scan was ordered several days ago.  [name] 10/24/2021 11:26 AM --

## 2021-10-24 NOTE — Progress Notes (Signed)
Nephrology Follow-Up Consult note   Assessment/Recommendations: Tammy Moses is a/an 55 y.o. female with a past medical history significant for DM2, HTN, COPD, admitted for patellar fracture.     Patellar fracture: In immobilizer.  Management per primary team  ESRD: previously at [facility] MWF schedule for now -On higher dose of gabapentin than what is recommended for ESRD.  Have decreased by 50% already.  We will continue to try and decrease down to 300 mg daily as tolerated  Hypertension/volume: Blood pressure fairly high.  Volume removal with dialysis.  Adjustment of blood pressure medications after dialysis.  Currently on amlodipine 10 mg daily and hydralazine 100 mg 3 times daily.  We will add losartan 50 mg daily.  Anemia secondary to ESRD: Hgb 7, may need transfusion soon. Iron sat 7 and ferritin 742.  IV iron ordered; will hold 2 doses if transfusion is required.  Aranesp 174mcg to be given on Monday  secondary hyperparathyroidism: Continue PhosLo.  Follow-up PTH.  Diabetes Mellitus Type 2: Continue management per primary team  Gangrenous right toe: Related to peripheral arterial disease.  Vascular surgery following   Recommendations conveyed to primary service.    [facility] 10/24/2021 9:59 AM  ___________________________________________________________  CC: fall/ patellar fx  Interval History/Subjective: Patient had just defecated on himself but otherwise felt well with no complaints.  Tolerated dialysis yesterday with no issues.   Medications:  Current Facility-Administered Medications  Medication Dose Route Frequency Provider Last Rate Last Admin   acetaminophen (TYLENOL) tablet 1,000 mg  1,000 mg Oral Q6H PRN Wynetta Fines T, MD       albuterol (PROVENTIL) (2.5 MG/3ML) 0.083% nebulizer solution 3 mL  3 mL Inhalation Q4H PRN Wynetta Fines T, MD       amLODipine (NORVASC) tablet 10 mg  10 mg Oral Daily  Wynetta Fines T, MD   10 mg at 10/24/21 0910   atropine 1 % ophthalmic solution 1 drop  1 drop Both Eyes BID Wynetta Fines T, MD   1 drop at 10/24/21 0912   benzonatate (TESSALON) capsule 100 mg  100 mg Oral TID PRN Vernelle Emerald, MD   100 mg at 10/23/21 0830   buPROPion (WELLBUTRIN SR) 12 hr tablet 150 mg  150 mg Oral Daily Wynetta Fines T, MD   150 mg at 10/24/21 0910   calcium acetate (PHOSLO) capsule 2,001 mg  2,001 mg Oral TID WC Lequita Halt, MD   2,001 mg at 10/24/21 0910   Chlorhexidine Gluconate Cloth 2 % PADS 6 each  6 each Topical Q0600 Corliss Parish, MD   6 each at 10/24/21 0612   dorzolamide-timolol (COSOPT) 22.3-6.8 MG/ML ophthalmic solution 1 drop  1 drop Both Eyes BID Wynetta Fines T, MD   1 drop at 10/24/21 0911   feeding supplement (NEPRO CARB STEADY) liquid 237 mL  237 mL Oral BID BM Oswald Hillock, MD   237 mL at 10/24/21 0911   ferric gluconate (FERRLECIT) 250 mg in sodium chloride 0.9 % 250 mL IVPB  250 mg Intravenous Daily Reesa Chew, MD 135 mL/hr at 10/23/21 2231 250 mg at 10/23/21 2231   gabapentin (NEURONTIN) capsule 300 mg  300 mg Oral q1800 Reesa Chew, MD   300 mg at 10/23/21 1833   heparin injection 5,000 Units  5,000 Units Subcutaneous Q12H Lequita Halt, MD   5,000 Units at 10/24/21 0910   hydrALAZINE (APRESOLINE) tablet 100 mg  100 mg Oral Q8H Wynetta Fines T, MD   100 mg at 10/24/21 0612   HYDROcodone-acetaminophen (NORCO/VICODIN) 5-325 MG per tablet 1-2 tablet  1-2 tablet Oral Q6H PRN Lequita Halt, MD   2 tablet at 10/24/21 0910   HYDROmorphone (DILAUDID) injection 0.5 mg  0.5 mg Intravenous Q2H PRN Wynetta Fines T, MD   0.5 mg at 10/22/21 0203   insulin aspart (novoLOG) injection 0-6 Units  0-6 Units Subcutaneous TID WC Wynetta Fines T, MD       labetalol (NORMODYNE) tablet 200 mg  200 mg Oral BID Oswald Hillock, MD   200 mg at 10/24/21 0910   lamoTRIgine (LAMICTAL) tablet 25 mg  25 mg Oral Daily Wynetta Fines T, MD   25 mg at 10/24/21 0911   latanoprost  (XALATAN) 0.005 % ophthalmic solution 1 drop  1 drop Both Eyes QHS Wynetta Fines T, MD   1 drop at 10/23/21 2156   metoCLOPramide (REGLAN) tablet 10 mg  10 mg Oral BID PRN Lequita Halt, MD       multivitamin (RENA-VIT) tablet 1 tablet  1 tablet Oral QHS Oswald Hillock, MD   1 tablet at 10/23/21 2157   pravastatin (PRAVACHOL) tablet 80 mg  80 mg Oral Daily Wynetta Fines T, MD   80 mg at 10/24/21 0910   senna-docusate (Senokot-S) tablet 1 tablet  1 tablet Oral QHS PRN Lequita Halt, MD   1 tablet at 10/22/21 2212      Review of Systems: 10 systems reviewed and negative except per interval history/subjective  Physical Exam: Vitals:   10/23/21 1900 10/24/21 0903  BP: 134/88 (!) 171/53  Pulse: 76 74  Resp: 14 16  Temp: 98.7 F (37.1 C) 98.6 F (37 C)  SpO2: 100% 99%   No intake/output data recorded.  Intake/Output Summary (Last 24 hours) at 10/24/2021 0959 Last data filed at 10/23/2021 1550 Gross per 24 hour  Intake --  Output 3000 ml  Net -3000 ml   Constitutional: chronically ill appearing, no acute distress ENMT: ears and nose without scars or lesions, MMM CV: normal rate, no edema Respiratory: Bilateral chest rise, normal work of breathing Gastrointestinal: soft, non-tender, no palpable masses or hernias Skin: gangrenous right toe, otherwise no visible lesions or rashes Psych: alert, judgement/insight appropriate, appropriate mood and affect   Test Results I personally reviewed new and old clinical labs and radiology tests Lab Results  Component Value Date   NA 134 (L) 10/23/2021   K 4.5 10/23/2021   CL 98 10/23/2021   CO2 23 10/23/2021   BUN 62 (H) 10/23/2021   CREATININE 11.99 (H) 10/23/2021   CALCIUM 8.8 (L) 10/23/2021   ALBUMIN 2.8 (L) 10/23/2021   PHOS 6.1 (H) 10/23/2021

## 2021-10-25 DIAGNOSIS — S82091A Other fracture of right patella, initial encounter for closed fracture: Secondary | ICD-10-CM | POA: Diagnosis not present

## 2021-10-25 LAB — COMPREHENSIVE METABOLIC PANEL
ALT: 15 U/L (ref 0–44)
AST: 17 U/L (ref 15–41)
Albumin: 2.7 g/dL — ABNORMAL LOW (ref 3.5–5.0)
Alkaline Phosphatase: 67 U/L (ref 38–126)
Anion gap: 13 (ref 5–15)
BUN: 37 mg/dL — ABNORMAL HIGH (ref 6–20)
CO2: 27 mmol/L (ref 22–32)
Calcium: 8.7 mg/dL — ABNORMAL LOW (ref 8.9–10.3)
Chloride: 94 mmol/L — ABNORMAL LOW (ref 98–111)
Creatinine, Ser: 9.08 mg/dL — ABNORMAL HIGH (ref 0.44–1.00)
GFR, Estimated: 5 mL/min — ABNORMAL LOW (ref >60)
Glucose, Bld: 133 mg/dL — ABNORMAL HIGH (ref 70–99)
Potassium: 4.1 mmol/L (ref 3.5–5.1)
Sodium: 134 mmol/L — ABNORMAL LOW (ref 135–145)
Total Bilirubin: 0.5 mg/dL (ref 0.3–1.2)
Total Protein: 6.9 g/dL (ref 6.5–8.1)

## 2021-10-25 LAB — GLUCOSE, CAPILLARY
Glucose-Capillary: 158 mg/dL — ABNORMAL HIGH (ref 70–99)
Glucose-Capillary: 106 mg/dL — ABNORMAL HIGH (ref 70–99)
Glucose-Capillary: 137 mg/dL — ABNORMAL HIGH (ref 70–99)
Glucose-Capillary: 128 mg/dL — ABNORMAL HIGH (ref 70–99)

## 2021-10-25 LAB — CBC
HCT: 23.9 % — ABNORMAL LOW (ref 36.0–46.0)
Hemoglobin: 7.3 g/dL — ABNORMAL LOW (ref 12.0–15.0)
MCH: 27.7 pg (ref 26.0–34.0)
MCHC: 30.5 g/dL (ref 30.0–36.0)
MCV: 90.5 fL (ref 80.0–100.0)
Platelets: 356 10*3/uL (ref 150–400)
RBC: 2.64 MIL/uL — ABNORMAL LOW (ref 3.87–5.11)
RDW: 16.2 % — ABNORMAL HIGH (ref 11.5–15.5)
WBC: 7.2 10*3/uL (ref 4.0–10.5)
nRBC: 0 % (ref 0.0–0.2)

## 2021-10-25 NOTE — Progress Notes (Signed)
Nephrology Follow-Up Consult note   Assessment/Recommendations: Tammy Moses is a/an 56 y.o. female with a past medical history significant for DM2, HTN, COPD, admitted for patellar fracture.     Patellar fracture: In immobilizer.  Management per primary team  ESRD: previously at [facility] now [place] MWF schedule for now  Hypertension/volume: Blood pressure fairly high but overall improved.  Volume removal with dialysis.  Currently on amlodipine 10 mg daily, losartan 50mg  daily and hydralazine 100 mg 3 times daily  Anemia secondary to ESRD: Hgb 7.3, may need transfusion soon. Iron sat 7 and ferritin 742.  IV iron ordered; will hold 2 doses if transfusion is required.  Aranesp 143mcg to be given on Monday  secondary hyperparathyroidism: Continue PhosLo.  PTH 429.  Diabetes Mellitus Type 2: Continue management per primary team  Gangrenous right toe: Related to peripheral arterial disease.  Vascular surgery following   Recommendations conveyed to primary service.    [facility] 10/25/2021 9:55 AM  ___________________________________________________________  CC: fall/ patellar fx  Interval History/Subjective: Some diarrhea yesterday but improved today. Also with sore throat. No other issues.   Medications:  Current Facility-Administered Medications  Medication Dose Route Frequency Provider Last Rate Last Admin   acetaminophen (TYLENOL) tablet 1,000 mg  1,000 mg Oral Q6H PRN Wynetta Fines T, MD   1,000 mg at 10/24/21 2132   albuterol (PROVENTIL) (2.5 MG/3ML) 0.083% nebulizer solution 3 mL  3 mL Inhalation Q4H PRN Wynetta Fines T, MD       amLODipine (NORVASC) tablet 10 mg  10 mg Oral Daily Wynetta Fines T, MD   10 mg at 10/25/21 0850   atropine 1 % ophthalmic solution 1 drop  1 drop Both Eyes BID Wynetta Fines T, MD   1 drop at 10/25/21 5170   benzonatate (TESSALON) capsule 100 mg  100 mg Oral TID PRN Vernelle Emerald, MD   100 mg at  10/24/21 2132   buPROPion (WELLBUTRIN SR) 12 hr tablet 150 mg  150 mg Oral Daily Wynetta Fines T, MD   150 mg at 10/25/21 0850   calcium acetate (PHOSLO) capsule 2,001 mg  2,001 mg Oral TID WC Wynetta Fines T, MD   2,001 mg at 10/25/21 0849   Chlorhexidine Gluconate Cloth 2 % PADS 6 each  6 each Topical Q0600 Corliss Parish, MD   6 each at 10/25/21 0616   [START ON 10/26/2021] Darbepoetin Alfa (ARANESP) injection 100 mcg  100 mcg Intravenous Q Mon-HD Reesa Chew, MD       dorzolamide-timolol (COSOPT) 22.3-6.8 MG/ML ophthalmic solution 1 drop  1 drop Both Eyes BID Wynetta Fines T, MD   1 drop at 10/25/21 0853   feeding supplement (NEPRO CARB STEADY) liquid 237 mL  237 mL Oral BID BM Oswald Hillock, MD   237 mL at 10/25/21 0851   ferric gluconate (FERRLECIT) 250 mg in sodium chloride 0.9 % 250 mL IVPB  250 mg Intravenous Daily Reesa Chew, MD 135 mL/hr at 10/25/21 0949 250 mg at 10/25/21 0949   gabapentin (NEURONTIN) capsule 300 mg  300 mg Oral q1800 Reesa Chew, MD   300 mg at 10/24/21 1650   heparin injection 5,000 Units  5,000 Units Subcutaneous Q12H Wynetta Fines T, MD   5,000 Units at 10/25/21 0851   hydrALAZINE (APRESOLINE) tablet 100 mg  100 mg Oral Q8H Wynetta Fines T, MD   100 mg at 10/25/21 0615   HYDROcodone-acetaminophen (NORCO/VICODIN) 5-325 MG  per tablet 1-2 tablet  1-2 tablet Oral Q6H PRN Wynetta Fines T, MD   2 tablet at 10/25/21 0615   HYDROmorphone (DILAUDID) injection 0.5 mg  0.5 mg Intravenous Q2H PRN Wynetta Fines T, MD   0.5 mg at 10/22/21 0203   insulin aspart (novoLOG) injection 0-6 Units  0-6 Units Subcutaneous TID WC Lequita Halt, MD   1 Units at 10/25/21 0847   labetalol (NORMODYNE) tablet 200 mg  200 mg Oral BID Oswald Hillock, MD   200 mg at 10/25/21 0850   lamoTRIgine (LAMICTAL) tablet 25 mg  25 mg Oral Daily Wynetta Fines T, MD   25 mg at 10/25/21 0849   latanoprost (XALATAN) 0.005 % ophthalmic solution 1 drop  1 drop Both Eyes QHS Wynetta Fines T, MD   1 drop at  10/24/21 2140   losartan (COZAAR) tablet 50 mg  50 mg Oral Daily Reesa Chew, MD   50 mg at 10/25/21 0850   metoCLOPramide (REGLAN) tablet 10 mg  10 mg Oral BID PRN Lequita Halt, MD   10 mg at 10/25/21 0944   multivitamin (RENA-VIT) tablet 1 tablet  1 tablet Oral QHS Oswald Hillock, MD   1 tablet at 10/24/21 2132   pravastatin (PRAVACHOL) tablet 80 mg  80 mg Oral Daily Wynetta Fines T, MD   80 mg at 10/25/21 0849   senna-docusate (Senokot-S) tablet 1 tablet  1 tablet Oral QHS PRN Lequita Halt, MD   1 tablet at 10/22/21 2212      Review of Systems: 10 systems reviewed and negative except per interval history/subjective  Physical Exam: Vitals:   10/25/21 0615 10/25/21 0824  BP: (!) 151/44 (!) 151/55  Pulse:  60  Resp:  17  Temp:  98 F (36.7 C)  SpO2:  97%   No intake/output data recorded.  Intake/Output Summary (Last 24 hours) at 10/25/2021 0955 Last data filed at 10/25/2021 0300 Gross per 24 hour  Intake 1286.31 ml  Output --  Net 1286.31 ml   Constitutional: chronically ill appearing, no acute distress ENMT: ears and nose without scars or lesions, MMM CV: normal rate, no edema Respiratory: Bilateral chest rise, normal work of breathing Gastrointestinal: soft, non-tender, no palpable masses or hernias Skin: gangrenous right toe, otherwise no visible lesions or rashes Psych: alert, judgement/insight appropriate, appropriate mood and affect   Test Results I personally reviewed new and old clinical labs and radiology tests Lab Results  Component Value Date   NA 134 (L) 10/25/2021   K 4.1 10/25/2021   CL 94 (L) 10/25/2021   CO2 27 10/25/2021   BUN 37 (H) 10/25/2021   CREATININE 9.08 (H) 10/25/2021   CALCIUM 8.7 (L) 10/25/2021   ALBUMIN 2.7 (L) 10/25/2021   PHOS 6.1 (H) 10/23/2021

## 2021-10-25 NOTE — Progress Notes (Signed)
PROGRESS NOTE    Tammy Moses  QIH:[id number] DOB: Aug 14, 1966 DOA: 10/21/2021 PCP: [name], FNP   Chief Complain: Right knee pain  Brief Narrative: Patient is a 56 year old female with history of ESRD on dialysis MWF, hypertension, normocytic anemia, COPD, blindness, tobacco use who presented with after falling down on her right knee when she slipped on ice.  She was unable to stand up and complained of pain and swelling of right knee and was brought to the emergency department.  Imaging showed nondisplaced right patella fracture on x-ray.  Orthopedics was consulted and recommended right knee brace with PT and follow-up as an outpatient.  Patient was also found to have right toe gangrene, ABI showed severe peripheral artery disease.  Vascular surgery following.  Assessment & Plan:   Principal Problem:   Closed patellar sleeve fracture of right knee Active Problems:   Patellar sleeve fracture of right knee, closed, initial encounter   Gangrene of foot (HCC)  Right knee patella nondisplaced fracture: Slipped on ice and fell on right knee.  Orthopedics consulted, recommended knee immobilizer, follow-up as an outpatient with orthopedics.  Currently on knee mobilizer.  PT/OT consulted recommended skilled NF discharge.  Right big toe gangrene: ABI showed severe peripheral artery disease.  CT angiogram showed severe atherosclerotic disease throughout the abdomen, pelvis and bilateral lower extremities,severe bilateral outflow disease demonstrated by segmental areas of stenosis and occlusive disease in the superficial femoral arteries and popliteal arteries. High-grade stenosis in the proximal left common femoral artery. Vascular surgery following and planning for left lower extremity arteriography on Tuesday  ESRD on dialysis: Dialyzed on Monday, Wednesday, Friday nephrology following  Diabetes type 2: Currently on sliding insulin scale.  Monitor blood sugars  Anemia of chronic  disease: Hemoglobin in the range of 7.  Iron panel showed severe iron deficiency, given IV iron.  Also aranesp.  Monitor CBC  Hypertension: Monitor blood pressure.  Continue amlodipine, hydralazine, labetalol, losartan  Diarrhea: Currently on Imodium.  GI pathogen panel sent       DVT prophylaxis:Hep [place] Code Status: Full Family Communication: None at bedside Patient status:Inpatient  Dispo: The patient is from: Home              Anticipated d/c is to:SNF               Anticipated d/c date is: Awaiting vascular surgery procedure  Consultants: Nephrology, vascular surgery, orthopedics  Procedures: Dialysisju  Antimicrobials:  Anti-infectives (From admission, onward)    None       Subjective:  Patient seen and examined at the bedside this morning.  Hemodynamically stable complains of some nausea.  Denies any other complaints.  Left knee pain better.  Objective: Vitals:   10/24/21 1656 10/24/21 2037 10/25/21 0615 10/25/21 0824  BP: (!) 160/52 (!) 146/42 (!) 151/44 (!) 151/55  Pulse:  66  60  Resp:  14  17  Temp:  98.4 F (36.9 C)  98 F (36.7 C)  TempSrc:  Oral  Oral  SpO2:  97%  97%  Weight:        Intake/Output Summary (Last 24 hours) at 10/25/2021 1006 Last data filed at 10/25/2021 0300 Gross per 24 hour  Intake 1286.31 ml  Output --  Net 1286.31 ml   Filed Weights   10/23/21 1135 10/23/21 1609  Weight: 56.8 kg 53.8 kg    Examination:  General exam: Overall comfortable, not in distress HEENT: PERRL Respiratory system:  no wheezes or crackles  Cardiovascular  system: S1 & S2 heard, RRR.  Gastrointestinal system: Abdomen is nondistended, soft and nontender. Central nervous system: Alert and oriented Extremities: No edema, no clubbing , knee immobilizer on the left, dry gangrene of left right big toe Skin: No rashes, no ulcers,no icterus      Data Reviewed: I have personally reviewed following labs and imaging studies  CBC: Recent Labs  Lab  10/21/21 1425 10/23/21 1001 10/25/21 0200  WBC 11.3* 7.4 7.2  NEUTROABS 7.6  --   --   HGB 8.9* 7.0* 7.3*  HCT 29.1* 22.2* 23.9*  MCV 92.1 91.4 90.5  PLT 507* 354 093   Basic Metabolic Panel: Recent Labs  Lab 10/21/21 1425 10/22/21 0309 10/23/21 1001 10/25/21 0200  NA 137 134* 134* 134*  K 4.5 4.9 4.5 4.1  CL 98 99 98 94*  CO2 24 20* 23 27  GLUCOSE 106* 79 143* 133*  BUN 49* 53* 62* 37*  CREATININE 9.29* 10.16* 11.99* 9.08*  CALCIUM 9.4 8.7* 8.8* 8.7*  PHOS  --   --  6.1*  --    GFR: Estimated Creatinine Clearance: 5.2 mL/min (A) (by C-G formula based on SCr of 9.08 mg/dL (H)). Liver Function Tests: Recent Labs  Lab 10/23/21 1001 10/25/21 0200  AST  --  17  ALT  --  15  ALKPHOS  --  67  BILITOT  --  0.5  PROT  --  6.9  ALBUMIN 2.8* 2.7*   No results for input(s): LIPASE, AMYLASE in the last 168 hours. No results for input(s): AMMONIA in the last 168 hours. Coagulation Profile: Recent Labs  Lab 10/21/21 1425  INR 1.2   Cardiac Enzymes: No results for input(s): CKTOTAL, CKMB, CKMBINDEX, TROPONINI in the last 168 hours. BNP (last 3 results) No results for input(s): PROBNP in the last 8760 hours. HbA1C: No results for input(s): HGBA1C in the last 72 hours. CBG: Recent Labs  Lab 10/24/21 0611 10/24/21 1120 10/24/21 1639 10/24/21 1951 10/25/21 0819  GLUCAP 119* 155* 74 144* 158*   Lipid Profile: No results for input(s): CHOL, HDL, LDLCALC, TRIG, CHOLHDL, LDLDIRECT in the last 72 hours. Thyroid Function Tests: No results for input(s): TSH, T4TOTAL, FREET4, T3FREE, THYROIDAB in the last 72 hours. Anemia Panel: Recent Labs    10/23/21 1001  FERRITIN 742*  TIBC 204*  IRON 15*   Sepsis Labs: No results for input(s): PROCALCITON, LATICACIDVEN in the last 168 hours.  Recent Results (from the past 240 hour(s))  Resp Panel by RT-PCR (Flu A&B, Covid) Nasopharyngeal Swab     Status: None   Collection Time: 10/21/21  2:51 PM   Specimen: Nasopharyngeal  Swab; Nasopharyngeal(NP) swabs in vial transport medium  Result Value Ref Range Status   SARS Coronavirus 2 by RT PCR NEGATIVE NEGATIVE Final    Comment: (NOTE) SARS-CoV-2 target nucleic acids are NOT DETECTED.  The SARS-CoV-2 RNA is generally detectable in upper respiratory specimens during the acute phase of infection. The lowest concentration of SARS-CoV-2 viral copies this assay can detect is 138 copies/mL. A negative result does not preclude SARS-Cov-2 infection and should not be used as the sole basis for treatment or other patient management decisions. A negative result may occur with  improper specimen collection/handling, submission of specimen other than nasopharyngeal swab, presence of viral mutation(s) within the areas targeted by this assay, and inadequate number of viral copies(<138 copies/mL). A negative result must be combined with clinical observations, patient history, and epidemiological information. The expected result is Negative.  Fact  Sheet for Patients:  EntrepreneurPulse.com.au  Fact Sheet for Healthcare Providers:  IncredibleEmployment.be  This test is no t yet approved or cleared by the [place] FDA and  has been authorized for detection and/or diagnosis of SARS-CoV-2 by FDA under an Emergency Use Authorization (EUA). This EUA will remain  in effect (meaning this test can be used) for the duration of the COVID-19 declaration under Section 564(b)(1) of the Act, 21 U.S.C.section 360bbb-3(b)(1), unless the authorization is terminated  or revoked sooner.       Influenza A by PCR NEGATIVE NEGATIVE Final   Influenza B by PCR NEGATIVE NEGATIVE Final    Comment: (NOTE) The Xpert Xpress SARS-CoV-2/FLU/RSV plus assay is intended as an aid in the diagnosis of influenza from Nasopharyngeal swab specimens and should not be used as a sole basis for treatment. Nasal washings and aspirates are unacceptable for Xpert Xpress  SARS-CoV-2/FLU/RSV testing.  Fact Sheet for Patients: EntrepreneurPulse.com.au  Fact Sheet for Healthcare Providers: IncredibleEmployment.be  This test is not yet approved or cleared by the [place] FDA and has been authorized for detection and/or diagnosis of SARS-CoV-2 by FDA under an Emergency Use Authorization (EUA). This EUA will remain in effect (meaning this test can be used) for the duration of the COVID-19 declaration under Section 564(b)(1) of the Act, 21 U.S.C. section 360bbb-3(b)(1), unless the authorization is terminated or revoked.  Performed at [facility], [place] [address], [place], [place] [address]          Radiology Studies: CT ANGIO AO+BIFEM W & OR WO CONTRAST  Result Date: 10/24/2021 CLINICAL DATA:  Claudication. Right great toe gangrene. Abnormal right ABI, 0.35. EXAM: CT ANGIOGRAPHY OF ABDOMINAL AORTA WITH ILIOFEMORAL RUNOFF TECHNIQUE: Multidetector CT imaging of the abdomen, pelvis and lower extremities was performed using the standard protocol during bolus administration of intravenous contrast. Multiplanar CT image reconstructions and MIPs were obtained to evaluate the vascular anatomy. CONTRAST:  151mL OMNIPAQUE IOHEXOL 350 MG/ML SOLN COMPARISON:  CT abdomen and pelvis 05/15/2021 FINDINGS: VASCULAR Aorta: Diffuse atherosclerotic disease in the abdominal aorta without aneurysm, dissection or significant aortic stenosis. Celiac: Celiac trunk is patent with mixed plaque and mild stenosis. Again noted is a peripherally calcified structure in the region of the left adrenal gland and adjacent to the main splenic artery. 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 A connection with the splenic artery is not identified. Main branches of the celiac trunk are patent. SMA: SMA is patent with diffuse calcifications. No significant stenosis. No evidence for aneurysm or  dissection. Renals: Origin of bilateral renal arteries are patent. There is diffuse plaque throughout the mid and distal aspect of the bilateral renal arteries causing at least mild stenosis in bilateral renal arteries. There is no evidence for a renal artery aneurysm. IMA: Patent. RIGHT Lower Extremity Inflow: Right iliac arteries are heavily calcified. Origin of the right internal iliac artery appears to be occluded. Diffuse mild narrowing throughout the right external iliac artery. Outflow: Right common femoral artery is diffusely calcified but patent without significant stenosis. Extensive atherosclerotic disease involving the superficial femoral artery and profunda femoral arteries. Segmental areas of stenosis involving the proximal right SFA. Evidence for high-grade stenosis in the distal SFA. The proximal popliteal artery is occluded or there is severe stenosis. Popliteal artery is very difficult evaluate due to the calcified walls. Popliteal artery appears to be occluded at the level of the knee joint. Runoff: Runoff vessels are diffusely  calcified which limits evaluation for patency. Evidence for segmental occlusions in the posterior tibial artery. Reconstitution of the posterior tibial artery near the ankle. Peroneal artery is likely patent. There is probably flow within the anterior tibial artery. LEFT Lower Extremity Inflow: Left iliac arteries are heavily calcified. Occlusion in the proximal left internal iliac artery. Diffuse narrowing in the left common and left external iliac arteries without aneurysm or dissection. Focal stenosis which could be high-grade in the distal left external iliac artery on sequence 7 image 119. Outflow: Evidence for high-grade stenosis in the proximal left common femoral artery on sequence 7, image 126. High-grade stenosis near the origin the left SFA. Left profunda femoral arteries are patent but heavily diseased. Left SFA is diffusely diseased with the evidence of  segmental occlusive disease in the mid and distal aspects. Limited evaluation of the left popliteal artery due to extensive atherosclerotic calcifications. Suspect areas of high-grade stenosis with segmental occlusive disease in the left popliteal artery. Runoff: Limited evaluation due to calcified vessels. Peroneal artery appears to be patent. Anterior tibial artery is diffusely diseased but appears to have some flow. Evidence for segmental occlusive disease in the posterior tibial artery. Distal reconstitution of the posterior tibial artery near the ankle. Veins: No obvious venous abnormality within the limitations of this arterial phase study. Review of the MIP images confirms the above findings. NON-VASCULAR Lower chest: Lung bases are clear. Hepatobiliary: No acute abnormality to the liver or gallbladder. Pancreas: Unremarkable. No pancreatic ductal dilatation or surrounding inflammatory changes. Spleen: Normal in size without focal abnormality. Adrenals/Urinary Tract: Again noted is a peripherally calcified structure in the region left adrenal gland and this could be related to a thrombosed aneurysm. Stable appearance of the adrenal glands. Negative for hydronephrosis. There is fluid in the urinary bladder. Stomach/Bowel: No evidence for acute bowel inflammation or bowel distension. Lymphatic: No significant lymph node enlargement in the abdomen or pelvis. Reproductive: Status post hysterectomy. No adnexal masses. Other: Mild stranding along the inferior aspect the right hepatic lobe and this appears chronic. No evidence for ascites. Negative for free air. Small nodular structures in the anterior subcutaneous fat and suspect this is related to injection sites. Musculoskeletal: Small right knee joint effusion. No acute bone abnormality in the abdomen or pelvis. IMPRESSION: VASCULAR 1. Severe atherosclerotic disease throughout the abdomen, pelvis and bilateral lower extremities. 2. Severe bilateral outflow  disease demonstrated by segmental areas of stenosis and occlusive disease in the superficial femoral arteries and popliteal arteries. High-grade stenosis in the proximal left common femoral artery. Difficult to accurately identify the areas of occlusive disease due to the heavily calcified vessels. 3. Bilateral runoff disease but limited evaluation due to the heavily calcified vessels. Suspect 2 vessel runoff bilaterally. 4. Bilateral iliac arteries are heavily calcified with areas of at least mild stenosis. Focal stenosis in the distal left external iliac artery. Occlusive disease involving the proximal internal iliac arteries bilaterally. 5. Stable peripherally calcified structure in the left upper abdomen near the splenic artery. This could represent a thrombosed aneurysm but the etiology is uncertain. NON-VASCULAR 1. No acute abnormality in the abdomen or pelvis. 2. Mild stranding near the inferior right hepatic lobe and ascending colon. Findings are similar to the exam from 05/15/2021. Findings could be related to previous inflammation in this area. Electronically Signed   By: Markus Daft M.D.   On: 10/24/2021 15:58        Scheduled Meds:  amLODipine  10 mg Oral Daily   atropine  1 drop Both Eyes BID   buPROPion  150 mg Oral Daily   calcium acetate  2,001 mg Oral TID WC   Chlorhexidine Gluconate Cloth  6 each Topical Q0600   [START ON 10/26/2021] darbepoetin (ARANESP) injection - DIALYSIS  100 mcg Intravenous Q Mon-HD   dorzolamide-timolol  1 drop Both Eyes BID   feeding supplement (NEPRO CARB STEADY)  237 mL Oral BID BM   gabapentin  300 mg Oral q1800   heparin  5,000 Units Subcutaneous Q12H   hydrALAZINE  100 mg Oral Q8H   insulin aspart  0-6 Units Subcutaneous TID WC   labetalol  200 mg Oral BID   lamoTRIgine  25 mg Oral Daily   latanoprost  1 drop Both Eyes QHS   losartan  50 mg Oral Daily   multivitamin  1 tablet Oral QHS   pravastatin  80 mg Oral Daily   Continuous Infusions:   ferric gluconate (FERRLECIT) IVPB 250 mg (10/25/21 0949)     LOS: 2 days    Time spent: More than 50% of that time was spent in counseling and/or coordination of care.      Shelly Coss, MD [facility] P1/10/2021, 10:06 AM

## 2021-10-25 NOTE — Progress Notes (Signed)
[facility]   Subjective  -no complaints.  CT completed.   Objective (!) 151/55 60 98 F (36.7 C) (Oral) 17 97%  Intake/Output Summary (Last 24 hours) at 10/25/2021 1008 Last data filed at 10/25/2021 0300 Gross per 24 hour  Intake 1286.31 ml  Output --  Net 1286.31 ml    Right great toe dry gangrene stable  Laboratory Lab Results: Recent Labs    10/23/21 1001 10/25/21 0200  WBC 7.4 7.2  HGB 7.0* 7.3*  HCT 22.2* 23.9*  PLT 354 356   BMET Recent Labs    10/23/21 1001 10/25/21 0200  NA 134* 134*  K 4.5 4.1  CL 98 94*  CO2 23 27  GLUCOSE 143* 133*  BUN 62* 37*  CREATININE 11.99* 9.08*  CALCIUM 8.8* 8.7*    COAG Lab Results  Component Value Date   INR 1.2 10/21/2021   INR 1.2 09/16/2021   INR 0.88 01/30/2014   No results found for: PTT  Assessment/Planning:  56 year old female the vascular was consulted for dry gangrene of the right great toe.  She underwent CTA as ordered by [name].  I have reviewed the images and on the right which is the side of interest she has a patent aortoiliac system.  Distally infrainguinal runoff appears to show diffusely diseased SFA and likely popliteal occlusion.  She has significant calcification that makes it somewhat difficult to evaluate patency.  Left common femoral with significant calcified high-grade stenosis.  I do think she would benefit from lower extremity arteriography to evaluate her options for revascularization. I have posted her for Tuesday in the Cath Lab with [name] as discussed with patient today.  [name] 10/25/2021 10:08 AM --

## 2021-10-25 NOTE — Plan of Care (Signed)
  Problem: Activity: Goal: Ability to ambulate and perform ADLs will improve Outcome: Progressing   Problem: Pain Management: Goal: Pain level will decrease Outcome: Progressing   Problem: Education: Goal: Knowledge of General Education information will improve Description: Including pain rating scale, medication(s)/side effects and non-pharmacologic comfort measures Outcome: Progressing   

## 2021-10-26 DIAGNOSIS — S82091A Other fracture of right patella, initial encounter for closed fracture: Secondary | ICD-10-CM | POA: Diagnosis not present

## 2021-10-26 LAB — GLUCOSE, CAPILLARY
Glucose-Capillary: 92 mg/dL (ref 70–99)
Glucose-Capillary: 93 mg/dL (ref 70–99)
Glucose-Capillary: 149 mg/dL — ABNORMAL HIGH (ref 70–99)
Glucose-Capillary: 115 mg/dL — ABNORMAL HIGH (ref 70–99)

## 2021-10-26 LAB — PARATHYROID HORMONE, INTACT (NO CA): PTH: 350 pg/mL — ABNORMAL HIGH (ref 15–65)

## 2021-10-26 NOTE — Progress Notes (Signed)
[facility] [place]  Subjective  -no complaints.  Seen in dialysis.   Objective (!) 108/45 71 97.8 F (36.6 C) (Oral) 17 99% No intake or output data in the 24 hours ending 10/26/21 0920   Right great toe dry gangrene stable  Laboratory Lab Results: Recent Labs    10/23/21 1001 10/25/21 0200  WBC 7.4 7.2  HGB 7.0* 7.3*  HCT 22.2* 23.9*  PLT 354 356   BMET Recent Labs    10/23/21 1001 10/25/21 0200  NA 134* 134*  K 4.5 4.1  CL 98 94*  CO2 23 27  GLUCOSE 143* 133*  BUN 62* 37*  CREATININE 11.99* 9.08*  CALCIUM 8.8* 8.7*    COAG Lab Results  Component Value Date   INR 1.2 10/21/2021   INR 1.2 09/16/2021   INR 0.88 01/30/2014   No results found for: PTT  Assessment/Planning:  56 year old female that vascular was consulted for dry gangrene of the right great toe.  She underwent CTA as ordered by [name].  I have reviewed the images and on the right which is the side of interest she has a patent aortoiliac system.  Distally infrainguinal runoff appears to show diffusely diseased SFA and likely popliteal occlusion.  She has significant calcification that makes it somewhat difficult to evaluate patency.  Left common femoral with significant calcified high-grade stenosis.  I do think she would benefit from lower extremity arteriography to evaluate her options for revascularization. I have posted her for Tuesday in the Cath Lab with [name] as discussed with patient again today.  Please keep NPO after midnight.  Consent order placed.  [name] 10/26/2021 9:20 AM --

## 2021-10-26 NOTE — Progress Notes (Signed)
PROGRESS NOTE    Tammy Moses  WNU:[id number] DOB: [birth year] DOA: 10/21/2021 PCP: [name], FNP   Chief Complain: Right knee pain  Brief Narrative: Patient is a 56 year old female with history of ESRD on dialysis MWF, hypertension, normocytic anemia, COPD, blindness, tobacco use who presented with after falling down on her right knee when she slipped on ice.  She was unable to stand up and complained of pain and swelling of right knee and was brought to the emergency department.  Imaging showed nondisplaced right patella fracture on x-ray.  Orthopedics was consulted and recommended right knee brace with PT and follow-up as an outpatient.  Patient was also found to have right toe gangrene, ABI showed severe peripheral artery disease.  Vascular surgery following, plan for left lower extremity arteriography tomorrow.  PT/OT recommending SNF  Assessment & Plan:   Principal Problem:   Closed patellar sleeve fracture of right knee Active Problems:   Patellar sleeve fracture of right knee, closed, initial encounter   Gangrene of foot ([place])  Right knee patella nondisplaced fracture: Slipped on ice and fell on right knee.  Orthopedics consulted, recommended knee immobilizer, follow-up as an outpatient with orthopedics.  Currently on knee mobilizer.  PT/OT consulted recommended skilled NF discharge.  Right big toe gangrene: ABI showed severe peripheral artery disease.  CT angiogram showed severe atherosclerotic disease throughout the abdomen, pelvis and bilateral lower extremities,severe bilateral outflow disease demonstrated by segmental areas of stenosis and occlusive disease in the superficial femoral arteries and popliteal arteries. High-grade stenosis in the proximal left common femoral artery. Vascular surgery following and planning for left lower extremity arteriography on Tuesday  ESRD on dialysis: Dialyzed on Monday, Wednesday, Friday. nephrology following  Diabetes type 2:  Currently on sliding insulin scale.  Monitor blood sugars  Anemia of chronic disease: Hemoglobin in the range of 7.  Iron panel showed severe iron deficiency, given IV iron.  Also aranesp.  Monitor CBC.  Will transfuse if hemoglobin drops less than 7.  Hypertension: Monitor blood pressure.  Continue amlodipine, hydralazine, labetalol, losartan  Diarrhea: Currently on Imodium.  GI pathogen panel sent  Debility/deconditioning: PT/OT recommending skilled nursing facility on discharge.  Social worker following       DVT prophylaxis:Hep Maben Code Status: Full Family Communication: None at bedside Patient status:Inpatient  Dispo: The patient is from: Home              Anticipated d/c is to:SNF               Anticipated d/c date is: Awaiting vascular surgery procedure  Consultants: Nephrology, vascular surgery, orthopedics  Procedures: Dialysisju  Antimicrobials:  Anti-infectives (From admission, onward)    None       Subjective:  Seen and examined at bedside this afternoon.was about to work with PT.No new complains Objective: Vitals:   10/26/21 1135 10/26/21 1140 10/26/21 1212 10/26/21 1247  BP: (!) 184/59 (!) 172/64  132/86  Pulse: 73 72  71  Resp:  18  20  Temp:  97.9 F (36.6 C)  97.6 F (36.4 C)  TempSrc:  Oral  Oral  SpO2:  100%  98%  Weight:   53.5 kg     Intake/Output Summary (Last 24 hours) at 10/26/2021 1428 Last data filed at 10/26/2021 1140 Gross per 24 hour  Intake --  Output 3000 ml  Net -3000 ml   Filed Weights   10/23/21 1609 10/26/21 0750 10/26/21 1212  Weight: 53.8 kg 57.2 kg 53.5 kg  Examination:  General exam: Overall comfortable, not in distress HEENT: legally blind,cataract on right eye Respiratory system:  no wheezes or crackles  Cardiovascular system: S1 & S2 heard, RRR.  Gastrointestinal system: Abdomen is nondistended, soft and nontender. Central nervous system: Alert and oriented Extremities: No edema, no clubbing , knee  immobilizer on the left, dry gangrene of left right big toe,AV fistula on left arm Skin: No rashes, no ulcers,no icterus      Data Reviewed: I have personally reviewed following labs and imaging studies  CBC: Recent Labs  Lab 10/21/21 1425 10/23/21 1001 10/25/21 0200  WBC 11.3* 7.4 7.2  NEUTROABS 7.6  --   --   HGB 8.9* 7.0* 7.3*  HCT 29.1* 22.2* 23.9*  MCV 92.1 91.4 90.5  PLT 507* 354 563   Basic Metabolic Panel: Recent Labs  Lab 10/21/21 1425 10/22/21 0309 10/23/21 1001 10/25/21 0200  NA 137 134* 134* 134*  K 4.5 4.9 4.5 4.1  CL 98 99 98 94*  CO2 24 20* 23 27  GLUCOSE 106* 79 143* 133*  BUN 49* 53* 62* 37*  CREATININE 9.29* 10.16* 11.99* 9.08*  CALCIUM 9.4 8.7* 8.8* 8.7*  PHOS  --   --  6.1*  --    GFR: Estimated Creatinine Clearance: 5.2 mL/min (A) (by C-G formula based on SCr of 9.08 mg/dL (H)). Liver Function Tests: Recent Labs  Lab 10/23/21 1001 10/25/21 0200  AST  --  17  ALT  --  15  ALKPHOS  --  67  BILITOT  --  0.5  PROT  --  6.9  ALBUMIN 2.8* 2.7*   No results for input(s): LIPASE, AMYLASE in the last 168 hours. No results for input(s): AMMONIA in the last 168 hours. Coagulation Profile: Recent Labs  Lab 10/21/21 1425  INR 1.2   Cardiac Enzymes: No results for input(s): CKTOTAL, CKMB, CKMBINDEX, TROPONINI in the last 168 hours. BNP (last 3 results) No results for input(s): PROBNP in the last 8760 hours. HbA1C: No results for input(s): HGBA1C in the last 72 hours. CBG: Recent Labs  Lab 10/25/21 1142 10/25/21 1539 10/25/21 1938 10/26/21 0649 10/26/21 1243  GLUCAP 106* 137* 128* 92 93   Lipid Profile: No results for input(s): CHOL, HDL, LDLCALC, TRIG, CHOLHDL, LDLDIRECT in the last 72 hours. Thyroid Function Tests: No results for input(s): TSH, T4TOTAL, FREET4, T3FREE, THYROIDAB in the last 72 hours. Anemia Panel: No results for input(s): VITAMINB12, FOLATE, FERRITIN, TIBC, IRON, RETICCTPCT in the last 72 hours.  Sepsis  Labs: No results for input(s): PROCALCITON, LATICACIDVEN in the last 168 hours.  Recent Results (from the past 240 hour(s))  Resp Panel by RT-PCR (Flu A&B, Covid) Nasopharyngeal Swab     Status: None   Collection Time: 10/21/21  2:51 PM   Specimen: Nasopharyngeal Swab; Nasopharyngeal(NP) swabs in vial transport medium  Result Value Ref Range Status   SARS Coronavirus 2 by RT PCR NEGATIVE NEGATIVE Final    Comment: (NOTE) SARS-CoV-2 target nucleic acids are NOT DETECTED.  The SARS-CoV-2 RNA is generally detectable in upper respiratory specimens during the acute phase of infection. The lowest concentration of SARS-CoV-2 viral copies this assay can detect is 138 copies/mL. A negative result does not preclude SARS-Cov-2 infection and should not be used as the sole basis for treatment or other patient management decisions. A negative result may occur with  improper specimen collection/handling, submission of specimen other than nasopharyngeal swab, presence of viral mutation(s) within the areas targeted by this assay, and inadequate  number of viral copies(<138 copies/mL). A negative result must be combined with clinical observations, patient history, and epidemiological information. The expected result is Negative.  Fact Sheet for Patients:  EntrepreneurPulse.com.au  Fact Sheet for Healthcare Providers:  IncredibleEmployment.be  This test is no t yet approved or cleared by the [place] FDA and  has been authorized for detection and/or diagnosis of SARS-CoV-2 by FDA under an Emergency Use Authorization (EUA). This EUA will remain  in effect (meaning this test can be used) for the duration of the COVID-19 declaration under Section 564(b)(1) of the Act, 21 U.S.C.section 360bbb-3(b)(1), unless the authorization is terminated  or revoked sooner.       Influenza A by PCR NEGATIVE NEGATIVE Final   Influenza B by PCR NEGATIVE NEGATIVE Final     Comment: (NOTE) The Xpert Xpress SARS-CoV-2/FLU/RSV plus assay is intended as an aid in the diagnosis of influenza from Nasopharyngeal swab specimens and should not be used as a sole basis for treatment. Nasal washings and aspirates are unacceptable for Xpert Xpress SARS-CoV-2/FLU/RSV testing.  Fact Sheet for Patients: EntrepreneurPulse.com.au  Fact Sheet for Healthcare Providers: IncredibleEmployment.be  This test is not yet approved or cleared by the [place] FDA and has been authorized for detection and/or diagnosis of SARS-CoV-2 by FDA under an Emergency Use Authorization (EUA). This EUA will remain in effect (meaning this test can be used) for the duration of the COVID-19 declaration under Section 564(b)(1) of the Act, 21 U.S.C. section 360bbb-3(b)(1), unless the authorization is terminated or revoked.  Performed at [facility], [place] [address], [place], [place] [address]          Radiology Studies: CT ANGIO AO+BIFEM W & OR WO CONTRAST  Result Date: 10/24/2021 CLINICAL DATA:  Claudication. Right great toe gangrene. Abnormal right ABI, 0.35. EXAM: CT ANGIOGRAPHY OF ABDOMINAL AORTA WITH ILIOFEMORAL RUNOFF TECHNIQUE: Multidetector CT imaging of the abdomen, pelvis and lower extremities was performed using the standard protocol during bolus administration of intravenous contrast. Multiplanar CT image reconstructions and MIPs were obtained to evaluate the vascular anatomy. CONTRAST:  149mL OMNIPAQUE IOHEXOL 350 MG/ML SOLN COMPARISON:  CT abdomen and pelvis 05/15/2021 FINDINGS: VASCULAR Aorta: Diffuse atherosclerotic disease in the abdominal aorta without aneurysm, dissection or significant aortic stenosis. Celiac: Celiac trunk is patent with mixed plaque and mild stenosis. Again noted is a peripherally calcified structure in the region of the left adrenal gland and adjacent to the main splenic artery. 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 A connection with the splenic artery is not identified. Main branches of the celiac trunk are patent. SMA: SMA is patent with diffuse calcifications. No significant stenosis. No evidence for aneurysm or dissection. Renals: Origin of bilateral renal arteries are patent. There is diffuse plaque throughout the mid and distal aspect of the bilateral renal arteries causing at least mild stenosis in bilateral renal arteries. There is no evidence for a renal artery aneurysm. IMA: Patent. RIGHT Lower Extremity Inflow: Right iliac arteries are heavily calcified. Origin of the right internal iliac artery appears to be occluded. Diffuse mild narrowing throughout the right external iliac artery. Outflow: Right common femoral artery is diffusely calcified but patent without significant stenosis. Extensive atherosclerotic disease involving the superficial femoral artery and profunda femoral arteries. Segmental areas of stenosis involving the proximal right SFA. Evidence for high-grade stenosis in the distal SFA. The proximal popliteal artery is occluded or there is severe stenosis. Popliteal artery is  very difficult evaluate due to the calcified walls. Popliteal artery appears to be occluded at the level of the knee joint. Runoff: Runoff vessels are diffusely calcified which limits evaluation for patency. Evidence for segmental occlusions in the posterior tibial artery. Reconstitution of the posterior tibial artery near the ankle. Peroneal artery is likely patent. There is probably flow within the anterior tibial artery. LEFT Lower Extremity Inflow: Left iliac arteries are heavily calcified. Occlusion in the proximal left internal iliac artery. Diffuse narrowing in the left common and left external iliac arteries without aneurysm or dissection. Focal stenosis which could be high-grade in the distal left external iliac artery on sequence 7 image 119. Outflow: Evidence  for high-grade stenosis in the proximal left common femoral artery on sequence 7, image 126. High-grade stenosis near the origin the left SFA. Left profunda femoral arteries are patent but heavily diseased. Left SFA is diffusely diseased with the evidence of segmental occlusive disease in the mid and distal aspects. Limited evaluation of the left popliteal artery due to extensive atherosclerotic calcifications. Suspect areas of high-grade stenosis with segmental occlusive disease in the left popliteal artery. Runoff: Limited evaluation due to calcified vessels. Peroneal artery appears to be patent. Anterior tibial artery is diffusely diseased but appears to have some flow. Evidence for segmental occlusive disease in the posterior tibial artery. Distal reconstitution of the posterior tibial artery near the ankle. Veins: No obvious venous abnormality within the limitations of this arterial phase study. Review of the MIP images confirms the above findings. NON-VASCULAR Lower chest: Lung bases are clear. Hepatobiliary: No acute abnormality to the liver or gallbladder. Pancreas: Unremarkable. No pancreatic ductal dilatation or surrounding inflammatory changes. Spleen: Normal in size without focal abnormality. Adrenals/Urinary Tract: Again noted is a peripherally calcified structure in the region left adrenal gland and this could be related to a thrombosed aneurysm. Stable appearance of the adrenal glands. Negative for hydronephrosis. There is fluid in the urinary bladder. Stomach/Bowel: No evidence for acute bowel inflammation or bowel distension. Lymphatic: No significant lymph node enlargement in the abdomen or pelvis. Reproductive: Status post hysterectomy. No adnexal masses. Other: Mild stranding along the inferior aspect the right hepatic lobe and this appears chronic. No evidence for ascites. Negative for free air. Small nodular structures in the anterior subcutaneous fat and suspect this is related to injection  sites. Musculoskeletal: Small right knee joint effusion. No acute bone abnormality in the abdomen or pelvis. IMPRESSION: VASCULAR 1. Severe atherosclerotic disease throughout the abdomen, pelvis and bilateral lower extremities. 2. Severe bilateral outflow disease demonstrated by segmental areas of stenosis and occlusive disease in the superficial femoral arteries and popliteal arteries. High-grade stenosis in the proximal left common femoral artery. Difficult to accurately identify the areas of occlusive disease due to the heavily calcified vessels. 3. Bilateral runoff disease but limited evaluation due to the heavily calcified vessels. Suspect 2 vessel runoff bilaterally. 4. Bilateral iliac arteries are heavily calcified with areas of at least mild stenosis. Focal stenosis in the distal left external iliac artery. Occlusive disease involving the proximal internal iliac arteries bilaterally. 5. Stable peripherally calcified structure in the left upper abdomen near the splenic artery. This could represent a thrombosed aneurysm but the etiology is uncertain. NON-VASCULAR 1. No acute abnormality in the abdomen or pelvis. 2. Mild stranding near the inferior right hepatic lobe and ascending colon. Findings are similar to the exam from 05/15/2021. Findings could be related to previous inflammation in this area. Electronically Signed   By: Markus Daft  M.D.   On: 10/24/2021 15:58        Scheduled Meds:  amLODipine  10 mg Oral Daily   atropine  1 drop Both Eyes BID   buPROPion  150 mg Oral Daily   calcium acetate  2,001 mg Oral TID WC   Chlorhexidine Gluconate Cloth  6 each Topical Q0600   darbepoetin (ARANESP) injection - DIALYSIS  100 mcg Intravenous Q Mon-HD   dorzolamide-timolol  1 drop Both Eyes BID   feeding supplement (NEPRO CARB STEADY)  237 mL Oral BID BM   gabapentin  300 mg Oral q1800   heparin  5,000 Units Subcutaneous Q12H   hydrALAZINE  100 mg Oral Q8H   insulin aspart  0-6 Units Subcutaneous  TID WC   labetalol  200 mg Oral BID   lamoTRIgine  25 mg Oral Daily   latanoprost  1 drop Both Eyes QHS   losartan  50 mg Oral Daily   multivitamin  1 tablet Oral QHS   pravastatin  80 mg Oral Daily   Continuous Infusions:  ferric gluconate (FERRLECIT) IVPB 250 mg (10/26/21 1310)     LOS: 3 days    Time spent: 25 mins.More than 50% of that time was spent in counseling and/or coordination of care.      Shelly Coss, MD [facility] P1/11/2021, 2:28 PM

## 2021-10-26 NOTE — Progress Notes (Signed)
Physical Therapy Treatment Patient Details Name: Tammy Moses MRN: [id number] DOB: [birth year] Today's Date: 10/26/2021   History of Present Illness Pt is a 56 y/o female presenting to the ED on 12/28 secondary to fall. Found to have R patellar fx. PMH includes ESRD on HD, HTN, DM, COPD, blindness, tobacco abuse.    PT Comments    Pt admitted with above diagnosis. Pt was able to stand at EOB and able to maintain NWB right LE for a few steps. Fatigues quickly and continues to need SNF for rehab.  Progressing slowly.  Pt currently with functional limitations due to balance and endurance deficits. Pt will benefit from skilled PT to increase their independence and safety with mobility to allow discharge to the venue listed below.      Recommendations for follow up therapy are one component of a multi-disciplinary discharge planning process, led by the attending physician.  Recommendations may be updated based on patient status, additional functional criteria and insurance authorization.  Follow Up Recommendations  Skilled nursing-short term rehab (<3 hours/day)     Assistance Recommended at Discharge Frequent or constant Supervision/Assistance  Equipment Recommendations  Rolling walker (2 wheels);Wheelchair (measurements PT)    Recommendations for Other Services       Precautions / Restrictions Precautions Precautions: Fall Precaution Comments: blind, R foot drop Required Braces or Orthoses: Knee Immobilizer - Right Restrictions RLE Weight Bearing: Non weight bearing     Mobility  Bed Mobility Overal bed mobility: Needs Assistance Bed Mobility: Supine to Sit     Supine to sit: Min assist Sit to supine: Modified independent (Device/Increase time)   General bed mobility comments: Pt required step by step cues for sequencing and assist to move Rt LE off the bed and to lift trunk    Transfers Overall transfer level: Needs assistance Equipment used: Rolling walker (2  wheels) Transfers: Sit to/from Stand Sit to Stand: Min assist;From elevated surface           General transfer comment: Required min A for lift assist and steadying to stand. Was able to maintain NWB right LE with cues.    Ambulation/Gait Ambulation/Gait assistance: Min assist Gait Distance (Feet): 3 Feet Assistive device: Rolling walker (2 wheels) Gait Pattern/deviations: Step-to pattern Gait velocity: Decreased Gait velocity interpretation: <1.31 ft/sec, indicative of household ambulator   General Gait Details: Hop to pattern to side step to [facility]. Pt was able to stand tall and keep the right LE maintained in NWB .  Pt took a few steps forward and back as well and then stated she was fatigued and wanted to get back into bed.   Stairs             Wheelchair Mobility    Modified Rankin (Stroke Patients Only)       Balance Overall balance assessment: Needs assistance Sitting-balance support: No upper extremity supported;Feet supported Sitting balance-Leahy Scale: Fair     Standing balance support: Bilateral upper extremity supported;During functional activity Standing balance-Leahy Scale: Poor Standing balance comment: Reliant on BUE support                            Cognition Arousal/Alertness: Awake/alert Behavior During Therapy: WFL for tasks assessed/performed Overall Cognitive Status: No family/caregiver present to determine baseline cognitive functioning  General Comments: Pt required cues for precautions.  She frequently requires redirection during activity - visual deficits coupled with unfamiliar environment may be contributing.  Anticipate she may be at baseline        Exercises General Exercises - Lower Extremity Ankle Circles/Pumps: AROM;Left;20 reps Long Arc Quad: 10 reps;Seated;AROM;Left Heel Slides: AROM;Left;20 reps Hip ABduction/ADduction: AAROM;Both;20 reps Straight Leg Raises:  AAROM;Both;20 reps    General Comments General comments (skin integrity, edema, etc.): Pt had just gotten back from HD and her lunch was in room - set pt up and assisted her with eating her meatloaf, cookies and grapes as pt doesnt see well.  She sat EOB to eat what she wanted piror to laying back down.      Pertinent Vitals/Pain Pain Assessment: Faces Faces Pain Scale: Hurts a little bit Pain Location: R LE Pain Descriptors / Indicators: Discomfort Pain Intervention(s): Limited activity within patient's tolerance;Monitored during session;Repositioned    Home Living                          Prior Function            PT Goals (current goals can now be found in the care plan section) Acute Rehab PT Goals Patient Stated Goal: to go home Progress towards PT goals: Progressing toward goals    Frequency    Min 3X/week      PT Plan Current plan remains appropriate    Co-evaluation              AM-PAC PT "6 Clicks" Mobility   Outcome Measure  Help needed turning from your back to your side while in a flat bed without using bedrails?: A Lot Help needed moving from lying on your back to sitting on the side of a flat bed without using bedrails?: A Lot Help needed moving to and from a bed to a chair (including a wheelchair)?: A Little Help needed standing up from a chair using your arms (e.g., wheelchair or bedside chair)?: A Little Help needed to walk in hospital room?: A Lot Help needed climbing 3-5 steps with a railing? : Total 6 Click Score: 13    End of Session Equipment Utilized During Treatment: Gait belt Activity Tolerance: Patient tolerated treatment well Patient left: with call bell/phone within reach;in bed;with bed alarm set Nurse Communication: Mobility status PT Visit Diagnosis: Unsteadiness on feet (R26.81);Muscle weakness (generalized) (M62.81);History of falling (Z91.81);Repeated falls (R29.6);Difficulty in walking, not elsewhere classified  (R26.2)     Time: 5397-6734 PT Time Calculation (min) (ACUTE ONLY): 20 min  Charges:  $Therapeutic Activity: 8-22 mins                     Sovereign Ramiro M,PT Acute Rehab Services [phone] (pager)    Alvira Philips 10/26/2021, 2:59 PM

## 2021-10-26 NOTE — Progress Notes (Signed)
PT Cancellation Note  Patient Details Name: Tammy Moses MRN: [id number] DOB: 1966/03/18   Cancelled Treatment:    Reason Eval/Treat Not Completed: Patient at procedure or test/unavailable (Pt in HD.  Will return as able.)   Alvira Philips 10/26/2021, 8:44 AM Jahaad Penado M,PT Acute Rehab Services [phone] (pager)

## 2021-10-26 NOTE — Progress Notes (Signed)
Occupational Therapy Treatment Patient Details Name: Tammy Moses MRN: [id number] DOB: September 23, 1966 Today's Date: 10/26/2021   History of present illness Pt is a 56 y/o female presenting to the ED on 12/28 secondary to fall. Found to have R patellar fx. PMH includes ESRD on HD, HTN, DM, COPD, blindness, tobacco abuse. (Simultaneous filing. User may not have seen previous data.)   OT comments  Pt fatigued from HD today, agreeable to seated ADLs, performed UB and LB exercises at bed level. Pt requires supervision for bed mobility to pull up into long sitting position. Educated pt on importance of OOB mobility, pt verbalizes understanding. Pt presenting with impairments listed below, will continue to follow acutely. Recommend SNF at d/c.   Recommendations for follow up therapy are one component of a multi-disciplinary discharge planning process, led by the attending physician.  Recommendations may be updated based on patient status, additional functional criteria and insurance authorization.    Follow Up Recommendations  Skilled nursing-short term rehab (<3 hours/day)    Assistance Recommended at Discharge Frequent or constant Supervision/Assistance  Equipment Recommendations  BSC/3in1    Recommendations for Other Services      Precautions / Restrictions Precautions Precautions: Fall (Simultaneous filing. User may not have seen previous data.) Precaution Comments: blind, R foot drop (Simultaneous filing. User may not have seen previous data.) Required Braces or Orthoses: Knee Immobilizer - Right (Simultaneous filing. User may not have seen previous data.) Restrictions Weight Bearing Restrictions: Yes RLE Weight Bearing: Non weight bearing (Simultaneous filing. User may not have seen previous data.)       Mobility Bed Mobility Overal bed mobility: Needs Assistance (Simultaneous filing. User may not have seen previous data.) Bed Mobility: Supine to Sit (Simultaneous filing. User may not  have seen previous data.)     Supine to sit: Supervision (Simultaneous filing. User may not have seen previous data.) Sit to supine: Modified independent (Device/Increase time)   General bed mobility comments: pt able to pull self up into long sitting for ADL (Simultaneous filing. User may not have seen previous data.)    Transfers Overall transfer level: Needs assistance (Simultaneous filing. User may not have seen previous data.)            General transfer comment: pt deferred standing/OOB mobility at this time, tired from HD this AM and has not been able to eat (Simultaneous filing. User may not have seen previous data.)     Balance Overall balance assessment: Needs assistance (Simultaneous filing. User may not have seen previous data.) Sitting-balance support: No upper extremity supported;Feet supported (Simultaneous filing. User may not have seen previous data.) Sitting balance-Leahy Scale: Fair (Simultaneous filing. User may not have seen previous data.)                                ADL either performed or assessed with clinical judgement   ADL       Grooming: Wash/dry hands;Wash/dry face;Oral care;Brushing hair;Set up;Supervision/safety;Sitting Grooming Details (indicate cue type and reason): performed seated ADLs in long sitting                                    Extremity/Trunk Assessment Upper Extremity Assessment Upper Extremity Assessment: Overall WFL for tasks assessed   Lower Extremity Assessment Lower Extremity Assessment: Defer to PT evaluation        Vision  Vision Assessment?: Vision impaired- to be further tested in functional context Additional Comments: pt is blind   Perception Perception Perception: Not tested   Praxis Praxis Praxis: Not tested    Cognition Arousal/Alertness: Awake/alert (Simultaneous filing. User may not have seen previous data.) Behavior During Therapy: [facility] for tasks assessed/performed  (Simultaneous filing. User may not have seen previous data.) Overall Cognitive Status: No family/caregiver present to determine baseline cognitive functioning                                 General Comments: Pt required cues for precautions.  She frequently requires redirection during activity - visual deficits coupled with unfamiliar environment may be contributing.  Anticipate she may be at baseline          Exercises Exercises: General Upper Extremity General Exercises - Upper Extremity Shoulder Flexion: AROM;10 reps;Right;Supine Elbow Flexion: AROM;Both;10 reps;Supine General Exercises - Lower Extremity Ankle Circles/Pumps: AROM;Both;10 reps;Supine (Simultaneous filing. User may not have seen previous data.)    Shoulder Instructions       General Comments KI on pt throughout session, (Simultaneous filing. User may not have seen previous data.)    Pertinent Vitals/ Pain       Pain Assessment: Faces (Simultaneous filing. User may not have seen previous data.) Pain Score: 2  Faces Pain Scale: Hurts a little bit (Simultaneous filing. User may not have seen previous data.) Pain Location: R LE (Simultaneous filing. User may not have seen previous data.) Pain Descriptors / Indicators: Discomfort (Simultaneous filing. User may not have seen previous data.) Pain Intervention(s): Limited activity within patient's tolerance;Monitored during session (Simultaneous filing. User may not have seen previous data.)  Home Living                                          Prior Functioning/Environment              Frequency  Min 2X/week        Progress Toward Goals  OT Goals(current goals can now be found in the care plan section)  Progress towards OT goals: Progressing toward goals  Acute Rehab OT Goals Patient Stated Goal: none stated OT Goal Formulation: With patient Time For Goal Achievement: 11/04/21 Potential to Achieve Goals: Good ADL  Goals Pt Will Perform Grooming: with supervision;with set-up;standing Pt Will Perform Upper Body Bathing: with set-up;with supervision;sitting Pt Will Perform Lower Body Bathing: with min assist;with min guard assist Pt Will Perform Upper Body Dressing: with supervision;with set-up;sitting Pt Will Perform Lower Body Dressing: with min assist;sit to/from stand Pt Will Transfer to Toilet: with min assist;stand pivot transfer;bedside commode Pt Will Perform Toileting - Clothing Manipulation and hygiene: with min assist;sit to/from stand Additional ADL Goal #1: Pt will follow simple commands 100% of the time with no cuing.  Plan Discharge plan remains appropriate;Frequency remains appropriate    Co-evaluation                 AM-PAC OT "6 Clicks" Daily Activity     Outcome Measure   Help from another person eating meals?: None Help from another person taking care of personal grooming?: A Little Help from another person toileting, which includes using toliet, bedpan, or urinal?: A Lot Help from another person bathing (including washing, rinsing, drying)?: A Lot Help from another person to put on  and taking off regular upper body clothing?: A Little Help from another person to put on and taking off regular lower body clothing?: Total 6 Click Score: 15    End of Session Equipment Utilized During Treatment: Right knee immobilizer  OT Visit Diagnosis: Unsteadiness on feet (R26.81);Muscle weakness (generalized) (M62.81);Low vision, both eyes (H54.2)   Activity Tolerance Patient tolerated treatment well   Patient Left in bed;with call bell/phone within reach;with bed alarm set   Nurse Communication Mobility status        Time: 1007-1219 OT Time Calculation (min): 21 min  Charges: OT General Charges $OT Visit: 1 Visit OT Treatments $Self Care/Home Management : 8-22 mins  Lynnda Child, OTD, OTR/L Acute Rehab [phone] - [place] 10/26/2021, 3:01 PM

## 2021-10-26 NOTE — Progress Notes (Signed)
°   10/26/21 1140  Vitals  Temp 97.9 F (36.6 C)  Temp Source Oral  BP (!) 172/64  BP Location Right Arm  BP Method Automatic  Patient Position (if appropriate) Lying  Pulse Rate 72  Pulse Rate Source Monitor  Resp 18  Oxygen Therapy  SpO2 100 %  O2 Device Room Air  Pain Assessment  Pain Scale 0-10  Pain Score 6  Post-Hemodialysis Assessment  Rinseback Volume (mL) 250 mL  KECN 261 V  Dialyzer Clearance Lightly streaked  Duration of HD Treatment -hour(s) 3.5 hour(s)  Hemodialysis Intake (mL) 500 mL  UF Total -Machine (mL) 3500 mL  Net UF (mL) 3000 mL  Tolerated HD Treatment Yes  Post-Hemodialysis Comments tx complete-pt stable  AVG/AVF Arterial Site Held (minutes) 7 minutes  AVG/AVF Venous Site Held (minutes) 7 minutes  Fistula / Graft Left Upper arm  Placement Date/Time: 07/12/17 (c) 1900   Placed prior to admission: Yes  Orientation: Left  Access Location: Upper arm  Site Condition No complications  Fistula / Graft Assessment Present;Thrill;Bruit  Status Deaccessed    HD tx complete, pt stable. Tx completed. Pain med given x2, with partial effects noted.

## 2021-10-26 NOTE — Plan of Care (Signed)

## 2021-10-26 NOTE — Progress Notes (Signed)
[facility] Progress Note   56 y.o. female with a past medical history significant for DM2, HTN, COPD, admitted for patellar fracture.       Assessment/ Plan:   1) Patellar fracture: In immobilizer.  Management per primary team   2) ESRD: previously at [facility] now [facility] MWF schedule for now Seen on HD through lt arm access 105/45 3L net UF goal on 2K bath   3) Hypertension/volume: Blood pressure fairly high but overall improved.  Volume removal with dialysis.  Currently on amlodipine 10 mg daily, losartan 50mg  daily and hydralazine 100 mg 3 times daily   4) Anemia secondary to ESRD: Hgb 7.3, may need transfusion soon. Iron sat 7 and ferritin 742.  IV iron ordered; will hold 2 doses if transfusion is required.  Aranesp 145mcg to be given on Monday   5) secondary hyperparathyroidism: Continue PhosLo.  PTH 429.   6) Diabetes Mellitus Type 2: Continue management per primary team   7) Gangrenous right toe: Related to peripheral arterial disease.  Vascular surgery following  Subjective:   Still has pain in the rt GT but states pain better after pain meds. Denies f/c/n/v.   Objective:   BP (!) 105/45    Pulse 67    Temp 97.8 F (36.6 C) (Oral)    Resp 17    Wt 57.2 kg    SpO2 99%    BMI 23.83 kg/m  No intake or output data in the 24 hours ending 10/26/21 0902 Weight change:   Physical Exam: Constitutional: chronically ill appearing, NAD ENMT: ears and nose without scars or lesions CV: normal rate, no edema Respiratory: Bilateral chest rise, not tachypneic Gastrointestinal: SNDNT + BS Skin: gangrenous right toe, otherwise no visible lesions or rashes Psych: alert, judgement/insight appropriate    Imaging: CT ANGIO AO+BIFEM W & OR WO CONTRAST  Result Date: 10/24/2021 CLINICAL DATA:  Claudication. Right great toe gangrene. Abnormal right ABI, 0.35. EXAM: CT ANGIOGRAPHY OF ABDOMINAL AORTA WITH ILIOFEMORAL RUNOFF TECHNIQUE: Multidetector CT  imaging of the abdomen, pelvis and lower extremities was performed using the standard protocol during bolus administration of intravenous contrast. Multiplanar CT image reconstructions and MIPs were obtained to evaluate the vascular anatomy. CONTRAST:  154mL OMNIPAQUE IOHEXOL 350 MG/ML SOLN COMPARISON:  CT abdomen and pelvis 05/15/2021 FINDINGS: VASCULAR Aorta: Diffuse atherosclerotic disease in the abdominal aorta without aneurysm, dissection or significant aortic stenosis. Celiac: Celiac trunk is patent with mixed plaque and mild stenosis. Again noted is a peripherally calcified structure in the region of the left adrenal gland and adjacent to the main splenic artery. 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 A connection with the splenic artery is not identified. Main branches of the celiac trunk are patent. SMA: SMA is patent with diffuse calcifications. No significant stenosis. No evidence for aneurysm or dissection. Renals: Origin of bilateral renal arteries are patent. There is diffuse plaque throughout the mid and distal aspect of the bilateral renal arteries causing at least mild stenosis in bilateral renal arteries. There is no evidence for a renal artery aneurysm. IMA: Patent. RIGHT Lower Extremity Inflow: Right iliac arteries are heavily calcified. Origin of the right internal iliac artery appears to be occluded. Diffuse mild narrowing throughout the right external iliac artery. Outflow: Right common femoral artery is diffusely calcified but patent without significant stenosis. Extensive atherosclerotic disease involving the superficial femoral artery and profunda femoral arteries. Segmental areas of stenosis involving  the proximal right SFA. Evidence for high-grade stenosis in the distal SFA. The proximal popliteal artery is occluded or there is severe stenosis. Popliteal artery is very difficult evaluate due to the calcified walls. Popliteal  artery appears to be occluded at the level of the knee joint. Runoff: Runoff vessels are diffusely calcified which limits evaluation for patency. Evidence for segmental occlusions in the posterior tibial artery. Reconstitution of the posterior tibial artery near the ankle. Peroneal artery is likely patent. There is probably flow within the anterior tibial artery. LEFT Lower Extremity Inflow: Left iliac arteries are heavily calcified. Occlusion in the proximal left internal iliac artery. Diffuse narrowing in the left common and left external iliac arteries without aneurysm or dissection. Focal stenosis which could be high-grade in the distal left external iliac artery on sequence 7 image 119. Outflow: Evidence for high-grade stenosis in the proximal left common femoral artery on sequence 7, image 126. High-grade stenosis near the origin the left SFA. Left profunda femoral arteries are patent but heavily diseased. Left SFA is diffusely diseased with the evidence of segmental occlusive disease in the mid and distal aspects. Limited evaluation of the left popliteal artery due to extensive atherosclerotic calcifications. Suspect areas of high-grade stenosis with segmental occlusive disease in the left popliteal artery. Runoff: Limited evaluation due to calcified vessels. Peroneal artery appears to be patent. Anterior tibial artery is diffusely diseased but appears to have some flow. Evidence for segmental occlusive disease in the posterior tibial artery. Distal reconstitution of the posterior tibial artery near the ankle. Veins: No obvious venous abnormality within the limitations of this arterial phase study. Review of the MIP images confirms the above findings. NON-VASCULAR Lower chest: Lung bases are clear. Hepatobiliary: No acute abnormality to the liver or gallbladder. Pancreas: Unremarkable. No pancreatic ductal dilatation or surrounding inflammatory changes. Spleen: Normal in size without focal abnormality.  Adrenals/Urinary Tract: Again noted is a peripherally calcified structure in the region left adrenal gland and this could be related to a thrombosed aneurysm. Stable appearance of the adrenal glands. Negative for hydronephrosis. There is fluid in the urinary bladder. Stomach/Bowel: No evidence for acute bowel inflammation or bowel distension. Lymphatic: No significant lymph node enlargement in the abdomen or pelvis. Reproductive: Status post hysterectomy. No adnexal masses. Other: Mild stranding along the inferior aspect the right hepatic lobe and this appears chronic. No evidence for ascites. Negative for free air. Small nodular structures in the anterior subcutaneous fat and suspect this is related to injection sites. Musculoskeletal: Small right knee joint effusion. No acute bone abnormality in the abdomen or pelvis. IMPRESSION: VASCULAR 1. Severe atherosclerotic disease throughout the abdomen, pelvis and bilateral lower extremities. 2. Severe bilateral outflow disease demonstrated by segmental areas of stenosis and occlusive disease in the superficial femoral arteries and popliteal arteries. High-grade stenosis in the proximal left common femoral artery. Difficult to accurately identify the areas of occlusive disease due to the heavily calcified vessels. 3. Bilateral runoff disease but limited evaluation due to the heavily calcified vessels. Suspect 2 vessel runoff bilaterally. 4. Bilateral iliac arteries are heavily calcified with areas of at least mild stenosis. Focal stenosis in the distal left external iliac artery. Occlusive disease involving the proximal internal iliac arteries bilaterally. 5. Stable peripherally calcified structure in the left upper abdomen near the splenic artery. This could represent a thrombosed aneurysm but the etiology is uncertain. NON-VASCULAR 1. No acute abnormality in the abdomen or pelvis. 2. Mild stranding near the inferior right hepatic lobe and ascending colon.  Findings are  similar to the exam from 05/15/2021. Findings could be related to previous inflammation in this area. Electronically Signed   By: Markus Daft M.D.   On: 10/24/2021 15:58    Labs: BMET Recent Labs  Lab 10/21/21 1425 10/22/21 0309 10/23/21 1001 10/25/21 0200  NA 137 134* 134* 134*  K 4.5 4.9 4.5 4.1  CL 98 99 98 94*  CO2 24 20* 23 27  GLUCOSE 106* 79 143* 133*  BUN 49* 53* 62* 37*  CREATININE 9.29* 10.16* 11.99* 9.08*  CALCIUM 9.4 8.7* 8.8* 8.7*  PHOS  --   --  6.1*  --    CBC Recent Labs  Lab 10/21/21 1425 10/23/21 1001 10/25/21 0200  WBC 11.3* 7.4 7.2  NEUTROABS 7.6  --   --   HGB 8.9* 7.0* 7.3*  HCT 29.1* 22.2* 23.9*  MCV 92.1 91.4 90.5  PLT 507* 354 356    Medications:     amLODipine  10 mg Oral Daily   atropine  1 drop Both Eyes BID   buPROPion  150 mg Oral Daily   calcium acetate  2,001 mg Oral TID WC   Chlorhexidine Gluconate Cloth  6 each Topical Q0600   darbepoetin (ARANESP) injection - DIALYSIS  100 mcg Intravenous Q Mon-HD   dorzolamide-timolol  1 drop Both Eyes BID   feeding supplement (NEPRO CARB STEADY)  237 mL Oral BID BM   gabapentin  300 mg Oral q1800   heparin  5,000 Units Subcutaneous Q12H   hydrALAZINE  100 mg Oral Q8H   insulin aspart  0-6 Units Subcutaneous TID WC   labetalol  200 mg Oral BID   lamoTRIgine  25 mg Oral Daily   latanoprost  1 drop Both Eyes QHS   losartan  50 mg Oral Daily   multivitamin  1 tablet Oral QHS   pravastatin  80 mg Oral Daily      Otelia Santee, MD 10/26/2021, 9:02 AM

## 2021-10-27 ENCOUNTER — Encounter (HOSPITAL_COMMUNITY)
Admission: EM | Disposition: A | Discharge status: Home Health | Payer: Self-pay | Source: Home / Self Care | Attending: Internal Medicine

## 2021-10-27 DIAGNOSIS — Z01810 Encounter for preprocedural cardiovascular examination: Secondary | ICD-10-CM | POA: Diagnosis not present

## 2021-10-27 DIAGNOSIS — S82091A Other fracture of right patella, initial encounter for closed fracture: Secondary | ICD-10-CM | POA: Diagnosis not present

## 2021-10-27 DIAGNOSIS — R011 Cardiac murmur, unspecified: Secondary | ICD-10-CM

## 2021-10-27 DIAGNOSIS — I96 Gangrene, not elsewhere classified: Secondary | ICD-10-CM | POA: Diagnosis not present

## 2021-10-27 HISTORY — PX: ABDOMINAL AORTOGRAM W/LOWER EXTREMITY: CATH118223

## 2021-10-27 LAB — GLUCOSE, CAPILLARY
Glucose-Capillary: 121 mg/dL — ABNORMAL HIGH (ref 70–99)
Glucose-Capillary: 105 mg/dL — ABNORMAL HIGH (ref 70–99)
Glucose-Capillary: 103 mg/dL — ABNORMAL HIGH (ref 70–99)
Glucose-Capillary: 109 mg/dL — ABNORMAL HIGH (ref 70–99)
Glucose-Capillary: 115 mg/dL — ABNORMAL HIGH (ref 70–99)

## 2021-10-27 LAB — CBC
HCT: 25.3 % — ABNORMAL LOW (ref 36.0–46.0)
Hemoglobin: 7.6 g/dL — ABNORMAL LOW (ref 12.0–15.0)
MCH: 27.3 pg (ref 26.0–34.0)
MCHC: 30 g/dL (ref 30.0–36.0)
MCV: 91 fL (ref 80.0–100.0)
Platelets: 359 10*3/uL (ref 150–400)
RBC: 2.78 MIL/uL — ABNORMAL LOW (ref 3.87–5.11)
RDW: 16.1 % — ABNORMAL HIGH (ref 11.5–15.5)
WBC: 11.9 10*3/uL — ABNORMAL HIGH (ref 4.0–10.5)
nRBC: 0 % (ref 0.0–0.2)

## 2021-10-27 LAB — BASIC METABOLIC PANEL
Anion gap: 12 (ref 5–15)
BUN: 21 mg/dL — ABNORMAL HIGH (ref 6–20)
CO2: 26 mmol/L (ref 22–32)
Calcium: 9.5 mg/dL (ref 8.9–10.3)
Chloride: 96 mmol/L — ABNORMAL LOW (ref 98–111)
Creatinine, Ser: 6.47 mg/dL — ABNORMAL HIGH (ref 0.44–1.00)
GFR, Estimated: 7 mL/min — ABNORMAL LOW (ref >60)
Glucose, Bld: 112 mg/dL — ABNORMAL HIGH (ref 70–99)
Potassium: 3.8 mmol/L (ref 3.5–5.1)
Sodium: 134 mmol/L — ABNORMAL LOW (ref 135–145)

## 2021-10-27 LAB — PHOSPHORUS: Phosphorus: 4.4 mg/dL (ref 2.5–4.6)

## 2021-10-27 SURGERY — ABDOMINAL AORTOGRAM W/LOWER EXTREMITY
Anesthesia: LOCAL

## 2021-10-27 MED ORDER — LABETALOL HCL 5 MG/ML IV SOLN
10.0000 mg | INTRAVENOUS | Status: DC | PRN
  Administered 2021-10-31 (×2): 10 mg via INTRAVENOUS
  Filled 2021-10-27 (×3): qty 4

## 2021-10-27 MED ORDER — HEPARIN (PORCINE) IN NACL 1000-0.9 UT/500ML-% IV SOLN
INTRAVENOUS | Status: AC
  Filled 2021-10-27: qty 500

## 2021-10-27 MED ORDER — HYDRALAZINE HCL 20 MG/ML IJ SOLN: 5.0000 mg | INTRAMUSCULAR | Status: DC | PRN

## 2021-10-27 MED ORDER — LIDOCAINE HCL (PF) 1 % IJ SOLN
INTRAMUSCULAR | Status: DC | PRN
  Administered 2021-10-27: 15 mL via INTRADERMAL

## 2021-10-27 MED ORDER — LIDOCAINE HCL (PF) 1 % IJ SOLN
INTRAMUSCULAR | Status: AC
  Filled 2021-10-27: qty 30

## 2021-10-27 MED ORDER — SODIUM CHLORIDE 0.9 % IV SOLN: 250.0000 mL | INTRAVENOUS | Status: DC | PRN

## 2021-10-27 MED ORDER — LIDOCAINE HCL (PF) 1 % IJ SOLN: 5.0000 mL | INTRAMUSCULAR | Status: DC | PRN

## 2021-10-27 MED ORDER — ACETAMINOPHEN 325 MG PO TABS
650.0000 mg | ORAL_TABLET | ORAL | Status: DC | PRN
  Administered 2021-11-04: 650 mg via ORAL
  Filled 2021-10-27 (×2): qty 2

## 2021-10-27 MED ORDER — FENTANYL CITRATE (PF) 100 MCG/2ML IJ SOLN
INTRAMUSCULAR | Status: AC
  Filled 2021-10-27: qty 2

## 2021-10-27 MED ORDER — FENTANYL CITRATE (PF) 100 MCG/2ML IJ SOLN
INTRAMUSCULAR | Status: DC | PRN
  Administered 2021-10-27: 50 ug via INTRAVENOUS

## 2021-10-27 MED ORDER — ONDANSETRON HCL 4 MG/2ML IJ SOLN: 4.0000 mg | Freq: Four times a day (QID) | INTRAMUSCULAR | Status: DC | PRN

## 2021-10-27 MED ORDER — SODIUM CHLORIDE 0.9% FLUSH
3.0000 mL | Freq: Two times a day (BID) | INTRAVENOUS | Status: DC
  Administered 2021-10-27 – 2021-11-06 (×18): 3 mL via INTRAVENOUS

## 2021-10-27 MED ORDER — MIDAZOLAM HCL 2 MG/2ML IJ SOLN
INTRAMUSCULAR | Status: AC
  Filled 2021-10-27: qty 2

## 2021-10-27 MED ORDER — PENTAFLUOROPROP-TETRAFLUOROETH EX AERO
1.0000 "application " | INHALATION_SPRAY | CUTANEOUS | Status: DC | PRN
  Filled 2021-10-27: qty 103.5

## 2021-10-27 MED ORDER — LIDOCAINE-PRILOCAINE 2.5-2.5 % EX CREA
1.0000 "application " | TOPICAL_CREAM | CUTANEOUS | Status: DC | PRN
  Filled 2021-10-27: qty 5

## 2021-10-27 MED ORDER — SODIUM CHLORIDE 0.9% FLUSH: 3.0000 mL | INTRAVENOUS | Status: DC | PRN

## 2021-10-27 MED ORDER — HEPARIN (PORCINE) IN NACL 1000-0.9 UT/500ML-% IV SOLN
INTRAVENOUS | Status: DC | PRN
  Administered 2021-10-27 (×2): 500 mL

## 2021-10-27 MED ORDER — MIDAZOLAM HCL 2 MG/2ML IJ SOLN
INTRAMUSCULAR | Status: DC | PRN
  Administered 2021-10-27: 2 mg via INTRAVENOUS

## 2021-10-27 MED ORDER — IODIXANOL 320 MG/ML IV SOLN
INTRAVENOUS | Status: DC | PRN
  Administered 2021-10-27: 110 mL via INTRA_ARTERIAL

## 2021-10-27 SURGICAL SUPPLY — 11 items
CATH OMNI FLUSH 5F 65CM (CATHETERS) ×1 IMPLANT
GLIDEWIRE NITREX 0.018X80X5 (WIRE) ×2
GUIDEWIRE NITREX 0.018X80X5 (WIRE) IMPLANT
KIT MICROPUNCTURE NIT STIFF (SHEATH) ×1 IMPLANT
KIT PV (KITS) ×2 IMPLANT
SHEATH PINNACLE 5F 10CM (SHEATH) ×1 IMPLANT
SHEATH PROBE COVER 6X72 (BAG) ×1 IMPLANT
SYR MEDRAD MARK V 150ML (SYRINGE) ×1 IMPLANT
TRANSDUCER W/STOPCOCK (MISCELLANEOUS) ×2 IMPLANT
TRAY PV CATH (CUSTOM PROCEDURE TRAY) ×2 IMPLANT
WIRE BENTSON .035X145CM (WIRE) ×1 IMPLANT

## 2021-10-27 NOTE — Plan of Care (Signed)

## 2021-10-27 NOTE — Progress Notes (Addendum)
Physical Therapy Treatment Patient Details Name: Tammy Moses MRN: [id number] DOB: Nov 14, 1965 Today's Date: 10/27/2021   History of Present Illness Pt is a 56 y/o female presenting to the ED on 12/28 secondary to fall. Found to have R patellar fx. PMH includes ESRD on HD, HTN, DM, COPD, blindness, tobacco abuse.    PT Comments    Patient progressing well towards PT goals. Session focused on gait training with use of RW for support. Chair follow helpful due to fatigue. Able to maintain NWB for the most part during hopping; only resting LE on ground with standing rest break. Requires Min A to power to standing. Cues needed due to blindness and unfamiliar environment. Appropriate for SNF. Will follow.   Recommendations for follow up therapy are one component of a multi-disciplinary discharge planning process, led by the attending physician.  Recommendations may be updated based on patient status, additional functional criteria and insurance authorization.  Follow Up Recommendations  Skilled nursing-short term rehab (<3 hours/day)     Assistance Recommended at Discharge Frequent or constant Supervision/Assistance  Patient can return home with the following A little help with walking and/or transfers;A little help with bathing/dressing/bathroom;Assistance with cooking/housework;Assist for transportation   Equipment Recommendations  Rolling walker (2 wheels);Wheelchair (measurements PT)    Recommendations for Other Services       Precautions / Restrictions Precautions Precautions: Fall;Other (comment) Precaution Comments: blind, R foot drop Required Braces or Orthoses: Knee Immobilizer - Right Knee Immobilizer - Right: On at all times Restrictions Weight Bearing Restrictions: Yes RLE Weight Bearing: Non weight bearing     Mobility  Bed Mobility Overal bed mobility: Needs Assistance Bed Mobility: Supine to Sit     Supine to sit: Supervision     General bed mobility comments:  No assist needed, HOB flat.    Transfers Overall transfer level: Needs assistance Equipment used: Rolling walker (2 wheels) Transfers: Sit to/from Stand Sit to Stand: Min assist           General transfer comment: Assist to power to standing with cues for hand placement/technique, stood from EOB x1, from chair x1, compliant with NWB with transitions.    Ambulation/Gait Ambulation/Gait assistance: Min guard;Min assist Gait Distance (Feet): 14 Feet (+ 20') Assistive device: Rolling walker (2 wheels)   Gait velocity: Decreased     General Gait Details: Hop to gait pattern with cues for RW management, 1 seated rest break, keeping weight off RLE mostly, resting on floor at times. Fatigues in UEs.   Stairs             Wheelchair Mobility    Modified Rankin (Stroke Patients Only)       Balance Overall balance assessment: Needs assistance Sitting-balance support: Feet supported;No upper extremity supported Sitting balance-Leahy Scale: Good     Standing balance support: During functional activity;Reliant on assistive device for balance Standing balance-Leahy Scale: Poor Standing balance comment: Reliant on BUE support                            Cognition Arousal/Alertness: Awake/alert Behavior During Therapy: WFL for tasks assessed/performed Overall Cognitive Status: No family/caregiver present to determine baseline cognitive functioning                                 General Comments: Likely baseline, Joking appropriately and motivated to mobilize. Needs cues during mobility due to blindness  and unfamiliar environment.        Exercises      General Comments General comments (skin integrity, edema, etc.): KI fell down below knee upon arrival, redonned it for appropriate fit      Pertinent Vitals/Pain Pain Assessment: Faces Faces Pain Scale: Hurts a little bit Pain Location: RLE Pain Descriptors / Indicators:  Discomfort;Sore Pain Intervention(s): Monitored during session;Repositioned    Home Living                          Prior Function            PT Goals (current goals can now be found in the care plan section) Progress towards PT goals: Progressing toward goals    Frequency    Min 3X/week      PT Plan Current plan remains appropriate    Co-evaluation              AM-PAC PT "6 Clicks" Mobility   Outcome Measure  Help needed turning from your back to your side while in a flat bed without using bedrails?: A Little Help needed moving from lying on your back to sitting on the side of a flat bed without using bedrails?: A Little Help needed moving to and from a bed to a chair (including a wheelchair)?: A Little Help needed standing up from a chair using your arms (e.g., wheelchair or bedside chair)?: A Little Help needed to walk in hospital room?: A Little Help needed climbing 3-5 steps with a railing? : Total 6 Click Score: 16    End of Session Equipment Utilized During Treatment: Gait belt Activity Tolerance: Patient tolerated treatment well Patient left: in chair;with call bell/phone within reach;with chair alarm set Nurse Communication: Mobility status PT Visit Diagnosis: Unsteadiness on feet (R26.81);Muscle weakness (generalized) (M62.81);History of falling (Z91.81);Repeated falls (R29.6);Difficulty in walking, not elsewhere classified (R26.2)     Time: 1040-1102 PT Time Calculation (min) (ACUTE ONLY): 22 min  Charges:  $Gait Training: 8-22 mins                     Marisa Severin, PT, DPT Acute Rehabilitation Services Pager [phone] Office [place] 10/27/2021, 11:41 AM

## 2021-10-27 NOTE — TOC Progression Note (Addendum)
Transition of Care [facility]) - Progression Note    Patient Details  Name: Tammy Moses MRN: [id number] Date of Birth: 1966/05/19  Transition of Care [facility]) CM/SW Contact  Joanne Chars, LCSW Phone Number: 10/27/2021, 10:08 AM  Clinical Narrative:   CSW met with pt regarding SNF offer at [facility].  Pt would like to accept this offer.  Pt reports her HD is MWF and [facility] provides transportation.  Juliann Pulse at [facility] confirms they can accept pt.  They do not transport for HD  CSW contacted [facility] transportation--pt attends HD [facility], need to talk with [place] about changing transportation due to SNF placement.    CSW messaged with renal navigator [place].  Need to find out if [facility] can take to [place] spoke with Pamala Hurry at [facility] transportation.  [phone].  They had been told about a week ago pt would DC home yesterday.  CSW updated her, she will need to check with some people if they can transport pt from [facility] to [facility].  She will call back.   [phone]: Angela Burke, [place] [facility] transportation.  It will work for pt to transport from [place] to [facility] for her HD.  Pamala Hurry just needs to know anticipated DC date as soon as we have one.    Expected Discharge Plan: [place] Barriers to Discharge: Continued Medical Work up, SNF Pending bed offer  Expected Discharge Plan and Services Expected Discharge Plan: [place] Choice: [place] Living arrangements for the past 2 months: [place], Apartment                   DME Agency: [employer]       HH Arranged: PT [place] Agency: [place] [facility]         Social Determinants of Health (SDOH) Interventions    Readmission Risk Interventions Readmission Risk Prevention Plan 10/09/2021  Transportation Screening Complete  HRI or Home Care  Consult Complete  SW Recovery Care/Counseling Consult Complete  Palliative Care Screening Not Applicable  Skilled Nursing Facility Complete  Some recent data might be hidden

## 2021-10-27 NOTE — Plan of Care (Signed)

## 2021-10-27 NOTE — Progress Notes (Signed)
PROGRESS NOTE    Tammy Moses  ION:[id number] DOB: 04-28-1966 DOA: 10/21/2021 PCP: [name], FNP   Chief Complain: Right knee pain  Brief Narrative: Patient is a 56 year old female with history of ESRD on dialysis MWF, hypertension, normocytic anemia, COPD, blindness, tobacco use who presented with after falling down on her right knee when she slipped on ice.  She was unable to stand up and complained of pain and swelling of right knee and was brought to the emergency department.  Imaging showed nondisplaced right patella fracture on x-ray.  Orthopedics was consulted and recommended right knee brace with PT and follow-up as an outpatient.  Patient was also found to have right toe gangrene, ABI showed severe peripheral artery disease.  Vascular surgery following, plan for left lower extremity arteriography today.  PT/OT recommending SNF on DC,TOC consulted  Assessment & Plan:   Principal Problem:   Closed patellar sleeve fracture of right knee Active Problems:   Patellar sleeve fracture of right knee, closed, initial encounter   Gangrene of foot ([place])  Right knee patella nondisplaced fracture: Slipped on ice and fell on right knee.  Orthopedics consulted, recommended knee immobilizer, follow-up as an outpatient with orthopedics.  Currently on knee mobilizer.  PT/OT consulted recommended skilled NF on discharge.  Right big toe gangrene: ABI showed severe peripheral artery disease.  CT angiogram showed severe atherosclerotic disease throughout the abdomen, pelvis and bilateral lower extremities,severe bilateral outflow disease demonstrated by segmental areas of stenosis and occlusive disease in the superficial femoral arteries and popliteal arteries. High-grade stenosis in the proximal left common femoral artery. Vascular surgery following and planning for left lower extremity arteriography today  ESRD on dialysis: Dialyzed on Monday, Wednesday, Friday. nephrology  following  Diabetes type 2: Currently on sliding insulin scale.  Monitor blood sugars  Anemia of chronic disease: Hemoglobin in the range of 7.  Iron panel showed severe iron deficiency, given IV iron.  Also aranesp.  Monitor CBC.  Will transfuse if hemoglobin drops less than 7.  Hypertension: Monitor blood pressure.  Continue amlodipine, hydralazine, labetalol, losartan  Debility/deconditioning: PT/OT recommending skilled nursing facility on discharge.  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  Social worker following       DVT prophylaxis:Hep Saunders Code Status: Full Family Communication: None at bedside Patient status:Inpatient  Dispo: The patient is from: Home              Anticipated d/c is to:SNF               Anticipated d/c date is: Awaiting vascular surgery procedure  Consultants: Nephrology, vascular surgery, orthopedics  Procedures: Dialysisju  Antimicrobials:  Anti-infectives (From admission, onward)    None       Subjective:  Patient seen and examined the bedside this morning.  Hemodynamically stable.  Comfortable.  Denies any complaints.  Waiting for vascular procedure today   objective: Vitals:   10/26/21 1247 10/26/21 1502 10/26/21 2010 10/27/21 0638  BP: 132/86 (!) 170/45 (!) 166/59 (!) 157/61  Pulse: 71  88 75  Resp: 20  20 16   Temp: 97.6 F (36.4 C)  98.7 F (37.1 C) 98.5 F (36.9 C)  TempSrc: Oral  Oral Oral  SpO2: 98%  99% 99%  Weight:        Intake/Output Summary (Last 24 hours) at 10/27/2021 0743 Last data filed at 10/26/2021 1140 Gross per 24 hour  Intake --  Output 3000 ml  Net -3000 ml   [employer]  10/23/21 1609 10/26/21 0750 10/26/21 1212  Weight: 53.8 kg 57.2 kg 53.5 kg    Examination:  General exam: Overall comfortable, not in distress HEENT: cataract on right eye with blindness Respiratory system:  no wheezes or crackles  Cardiovascular system: S1 & S2 heard, RRR.  Gastrointestinal system: Abdomen is nondistended, soft and  nontender. Central nervous system: Alert and oriented Extremities: No edema, no clubbing , knee immobilizer on the left, dry gangrene of left right big toe,AV fistula on left arm Skin: No rashes, no ulcers,no icterus      Data Reviewed: I have personally reviewed following labs and imaging studies  CBC: Recent Labs  Lab 10/21/21 1425 10/23/21 1001 10/25/21 0200 10/27/21 0627  WBC 11.3* 7.4 7.2 11.9*  NEUTROABS 7.6  --   --   --   HGB 8.9* 7.0* 7.3* 7.6*  HCT 29.1* 22.2* 23.9* 25.3*  MCV 92.1 91.4 90.5 91.0  PLT 507* 354 356 202   Basic Metabolic Panel: Recent Labs  Lab 10/21/21 1425 10/22/21 0309 10/23/21 1001 10/25/21 0200 10/27/21 0627  NA 137 134* 134* 134* 134*  K 4.5 4.9 4.5 4.1 3.8  CL 98 99 98 94* 96*  CO2 24 20* 23 27 26   GLUCOSE 106* 79 143* 133* 112*  BUN 49* 53* 62* 37* 21*  CREATININE 9.29* 10.16* 11.99* 9.08* 6.47*  CALCIUM 9.4 8.7* 8.8* 8.7* 9.5  PHOS  --   --  6.1*  --   --    GFR: Estimated Creatinine Clearance: 7.2 mL/min (A) (by C-G formula based on SCr of 6.47 mg/dL (H)). Liver Function Tests: Recent Labs  Lab 10/23/21 1001 10/25/21 0200  AST  --  17  ALT  --  15  ALKPHOS  --  67  BILITOT  --  0.5  PROT  --  6.9  ALBUMIN 2.8* 2.7*   No results for input(s): LIPASE, AMYLASE in the last 168 hours. No results for input(s): AMMONIA in the last 168 hours. Coagulation Profile: Recent Labs  Lab 10/21/21 1425  INR 1.2   Cardiac Enzymes: No results for input(s): CKTOTAL, CKMB, CKMBINDEX, TROPONINI in the last 168 hours. BNP (last 3 results) No results for input(s): PROBNP in the last 8760 hours. HbA1C: No results for input(s): HGBA1C in the last 72 hours. CBG: Recent Labs  Lab 10/26/21 0649 10/26/21 1243 10/26/21 1608 10/26/21 2103 10/27/21 0535  GLUCAP 92 93 149* 115* 121*   Lipid Profile: No results for input(s): CHOL, HDL, LDLCALC, TRIG, CHOLHDL, LDLDIRECT in the last 72 hours. Thyroid Function Tests: No results for  input(s): TSH, T4TOTAL, FREET4, T3FREE, THYROIDAB in the last 72 hours. Anemia Panel: No results for input(s): VITAMINB12, FOLATE, FERRITIN, TIBC, IRON, RETICCTPCT in the last 72 hours.  Sepsis Labs: No results for input(s): PROCALCITON, LATICACIDVEN in the last 168 hours.  Recent Results (from the past 240 hour(s))  Resp Panel by RT-PCR (Flu A&B, Covid) Nasopharyngeal Swab     Status: None   Collection Time: 10/21/21  2:51 PM   Specimen: Nasopharyngeal Swab; Nasopharyngeal(NP) swabs in vial transport medium  Result Value Ref Range Status   SARS Coronavirus 2 by RT PCR NEGATIVE NEGATIVE Final    Comment: (NOTE) SARS-CoV-2 target nucleic acids are NOT DETECTED.  The SARS-CoV-2 RNA is generally detectable in upper respiratory specimens during the acute phase of infection. The lowest concentration of SARS-CoV-2 viral copies this assay can detect is 138 copies/mL. A negative result does not preclude SARS-Cov-2 infection and should not be used  as the sole basis for treatment or other patient management decisions. A negative result may occur with  improper specimen collection/handling, submission of specimen other than nasopharyngeal swab, presence of viral mutation(s) within the areas targeted by this assay, and inadequate number of viral copies(<138 copies/mL). A negative result must be combined with clinical observations, patient history, and epidemiological information. The expected result is Negative.  Fact Sheet for Patients:  EntrepreneurPulse.com.au  Fact Sheet for Healthcare Providers:  IncredibleEmployment.be  This test is no t yet approved or cleared by the [place] FDA and  has been authorized for detection and/or diagnosis of SARS-CoV-2 by FDA under an Emergency Use Authorization (EUA). This EUA will remain  in effect (meaning this test can be used) for the duration of the COVID-19 declaration under Section 564(b)(1) of the Act,  21 U.S.C.section 360bbb-3(b)(1), unless the authorization is terminated  or revoked sooner.       Influenza A by PCR NEGATIVE NEGATIVE Final   Influenza B by PCR NEGATIVE NEGATIVE Final    Comment: (NOTE) The Xpert Xpress SARS-CoV-2/FLU/RSV plus assay is intended as an aid in the diagnosis of influenza from Nasopharyngeal swab specimens and should not be used as a sole basis for treatment. Nasal washings and aspirates are unacceptable for Xpert Xpress SARS-CoV-2/FLU/RSV testing.  Fact Sheet for Patients: EntrepreneurPulse.com.au  Fact Sheet for Healthcare Providers: IncredibleEmployment.be  This test is not yet approved or cleared by the [place] FDA and has been authorized for detection and/or diagnosis of SARS-CoV-2 by FDA under an Emergency Use Authorization (EUA). This EUA will remain in effect (meaning this test can be used) for the duration of the COVID-19 declaration under Section 564(b)(1) of the Act, 21 U.S.C. section 360bbb-3(b)(1), unless the authorization is terminated or revoked.  Performed at [facility], [place] [address], [place], [place] [address]          Radiology Studies: No results found.      Scheduled Meds:  amLODipine  10 mg Oral Daily   atropine  1 drop Both Eyes BID   buPROPion  150 mg Oral Daily   calcium acetate  2,001 mg Oral TID WC   Chlorhexidine Gluconate Cloth  6 each Topical Q0600   darbepoetin (ARANESP) injection - DIALYSIS  100 mcg Intravenous Q Mon-HD   dorzolamide-timolol  1 drop Both Eyes BID   feeding supplement (NEPRO CARB STEADY)  237 mL Oral BID BM   gabapentin  300 mg Oral q1800   heparin  5,000 Units Subcutaneous Q12H   hydrALAZINE  100 mg Oral Q8H   insulin aspart  0-6 Units Subcutaneous TID WC   labetalol  200 mg Oral BID   lamoTRIgine  25 mg Oral Daily   latanoprost  1 drop Both Eyes QHS   losartan  50 mg Oral Daily   multivitamin  1 tablet Oral QHS   pravastatin   80 mg Oral Daily   Continuous Infusions:     LOS: 4 days    Time spent: 25 mins.More than 50% of that time was spent in counseling and/or coordination of care.      Shelly Coss, MD [facility] P1/12/2021, 7:43 AM

## 2021-10-27 NOTE — Op Note (Signed)
Patient name: Tammy Moses MRN: [id number] DOB: 12-28-1965 Sex: female  10/21/2021 - 10/27/2021 Pre-operative Diagnosis: Right toe ulcer Post-operative diagnosis:  Same Surgeon:  Annamarie Major Procedure Performed:  1.  Ultrasound-guided access, left femoral artery  2.  Abdominal aortogram  3.  Bilateral lower extremity runoff  4.  Second-order catheterization  5.  Conscious sedation, 36 minutes  Indications: This is a 56 year old female with end-stage renal disease who has dry gangrene to her right great toe.  She comes in today for further evaluation.  Procedure:  The patient was identified in the holding area and taken to room 8.  The patient was then placed supine on the table and prepped and draped in the usual sterile fashion.  A time out was called.  Conscious sedation was administered with the use of IV fentanyl and Versed under continuous physician and nurse monitoring.  Heart rate, blood pressure, and oxygen saturation were continuously monitored.  Total sedation time was 36 minutes.  Ultrasound was used to evaluate the left common femoral artery.  It was patent .  A digital ultrasound image was acquired.  A micropuncture needle was used to access the left common femoral artery under ultrasound guidance.  An 018 wire was advanced without resistance and a micropuncture sheath was placed.  The 018 wire was removed and a benson wire was placed.  The micropuncture sheath was exchanged for a 5 french sheath.  An omniflush catheter was advanced over the wire to the level of L-1.  An abdominal angiogram was obtained.  Next, using the omniflush catheter and a benson wire, the aortic bifurcation was crossed and the catheter was placed into theright external iliac artery and right runoff was obtained.  left runoff was performed via retrograde sheath injections.  Findings:   Aortogram: No significant renal artery stenosis was identified.  The infrarenal abdominal aorta is heavily calcified but  widely patent.  The right common and external iliac artery heavily calcified but without significant stenosis.  Small caliber left common and external iliac arteries with moderate left common iliac stenosis.  Right Lower Extremity: Greater than 60% right common femoral artery calcific disease.  The profundofemoral artery is small in caliber.  It has significant disease distally.  The superficial femoral artery is also very small in caliber with multiple areas of high-grade lesion/occlusions.  Popliteal artery is a very small vessel.  There is single-vessel runoff via the peroneal artery which also has multiple areas of stenosis.  Left Lower Extremity: The left common femoral artery is nearly occluded.  A large profunda branch is visualized going down the leg.  The sheath appears to be occlusive and sewed the remaining portion of the leg was not visualized.  Intervention:  none  Impression:  #1  Severe infrainguinal occlusive disease bilaterally  #2  On the right, there is greater than 60-70% common femoral stenosis.  In addition the superficial femoral artery is extremely small in caliber with multiple high-grade areas of stenosis.  Popliteal artery is very small in caliber and there is single-vessel runoff via a diseased peroneal artery  #3  The patient does not have good options for revascularization.  I think her best approach is going to be a right femoral endarterectomy in the operating room and simultaneous percutaneous intervention onto the peroneal artery as well as the superficial femoral-popliteal artery.  This has been scheduled for Friday.    Theotis Burrow, M.D., [facility] [place] Office: [phone] Pager:  [phone]  °

## 2021-10-27 NOTE — Care Management Important Message (Signed)
Important Message  Patient Details  Name: Tammy Moses MRN: [id number] Date of Birth: 1965-11-21   Medicare Important Message Given:  Yes     Hannah Beat 10/27/2021, 1:16 PM

## 2021-10-27 NOTE — Consult Note (Addendum)
Cardiology Consultation:   Patient ID: [name] MRN: [id number]; DOB: [birth year]  Admit date: 10/21/2021 Date of Consult: 10/27/2021  PCP:  [name], [place] [facility] Cardiologist:  New    Patient Profile:   Tammy Moses is a 56 y.o. female with a hx of ESRD on HD, HTN, Anemia, COPD, blindness, tobacco use and PAD who is being seen 10/27/2021 for the evaluation of preop evaluation at the request of [name].  History of Present Illness:   Tammy Moses is a 56 yo female with PMH noted above. Denies having been seen by cardiology in the in the past or family hx of CAD. Does have prior echo from 08/2021 showing LVEF of 60-65% with no rWMA, normal RV, mildly elevated pulmonary artery systolic pressure, trivial mitral regurgitation.   Follows with nephrology as an outpatient, has HD on MWF.   She presented to the ED on 10/21/2021 after slipping on ice and injuring her right knee.  Initially complained of being unable to stand and having swelling in the right knee.  Imaging on admission showed nondisplaced right patella fracture.  Orthopedics was consulted and recommended a knee brace as well as PT with outpatient follow-up.  She was also noted to have discoloration to her right toe and ABIs showed severe peripheral artery disease.  Vascular surgery was consulted and she was taking her abdominal aortogram with bilateral lower extremity imaging with [name] on 1/3.  She was found to have severe infrainguinal occlusive disease bilaterally with no good options for revascularization.  It was felt that she would need right femoral endarterectomy in the OR with percutaneous intervention to the peroneal artery as well as superficial femoral popliteal artery which is planned for Friday.   In talking with patient she denies any anginal symptoms prior to admission. States she only develops shortness of breath whenever she is volume up prior to HD sessions.  Have risk factors  for coronary disease, continues to smoke.    Past Medical History:  Diagnosis Date   Anemia    Anxiety    Blind right eye    Chronic kidney disease, stage V (HCC)    Constipation, chronic    Dental caries    Diabetes mellitus    Diabetic neuropathy (HCC)    Diabetic retinopathy    GERD (gastroesophageal reflux disease)    Glaucoma    H. pylori infection    Hepatitis C carrier ([place])    High risk sexual behavior    Hyperlipidemia    Hypertension    Hypoglycemia 07/12/2017   Insomnia    Microalbuminuria    Nonspecific elevation of levels of transaminase or lactic acid dehydrogenase (LDH)    Tobacco dependence    Vitamin D deficiency     Past Surgical History:  Procedure Laterality Date   ABDOMINAL HYSTERECTOMY  04/30/2009   PARTIAL   COLONOSCOPY     DIALYSIS FISTULA CREATION Left 06/2017   ESOPHAGOGASTRODUODENOSCOPY     IR AV DIALY SHUNT INTRO NEEDLE/INTRACATH INITIAL W/PTA/IMG RIGHT Right 10/07/2021   IR FLUORO GUIDE CV LINE RIGHT  10/06/2021   IR US GUIDE VASC ACCESS RIGHT  10/06/2021   IR US GUIDE VASC ACCESS RIGHT  10/08/2021     Home Medications:  Prior to Admission medications   Medication Sig Start Date End Date Taking? Authorizing Provider  acetaminophen (TYLENOL) 500 MG tablet Take 1,000 mg by mouth every 6 (six) hours as needed for mild pain.  Yes [provider]  albuterol (VENTOLIN HFA) 108 (90 Base) MCG/ACT inhaler Inhale 2 puffs into the lungs every 4 (four) hours as needed for shortness of breath. For shortness of breath Patient taking differently: Inhale 2 puffs into the lungs every 4 (four) hours as needed for shortness of breath or wheezing. 12/11/19  Yes [name], FNP  amLODipine (NORVASC) 10 MG tablet TAKE 1 TABLET(10 MG) BY MOUTH DAILY Patient taking differently: Take 10 mg by mouth daily. 02/10/21  Yes [name], FNP  atropine 1 % ophthalmic solution Place 1 drop into both eyes 2 (two) times daily.   Yes [provider]  buPROPion (ZYBAN) 150 MG 12 hr tablet Take 150 mg by mouth daily. 01/16/21  Yes [provider]  calcium acetate (PHOSLO) 667 MG capsule Take 2,001 mg by mouth 3 (three) times daily with meals. 07/06/18  Yes [provider]  dorzolamide-timolol (COSOPT) 22.3-6.8 MG/ML ophthalmic solution Place 1 drop into both eyes 2 (two) times daily. 12/08/20  Yes [provider]  furosemide (LASIX) 40 MG tablet Take 1 tablet (40 mg total) by mouth daily. 12/11/19  Yes [name], FNP  gabapentin (NEURONTIN) 600 MG tablet Take 1 tablet (600 mg total) by mouth 2 (two) times daily. 12/11/19  Yes [name], FNP  hydrALAZINE (APRESOLINE) 100 MG tablet Take 1 tablet (100 mg total) by mouth every 8 (eight) hours. Patient taking differently: Take 100 mg by mouth 3 (three) times daily. 10/09/21  Yes Adhikari, Tamsen Meek, MD  insulin aspart (NOVOLOG) 100 UNIT/ML injection Inject 1-6 Units into the skin in the morning, at noon, in the evening, and at bedtime. Sliding scale 151-200= 1 unit 201-250=2 units 251-300= 3 units  301-350= 4 units 351-400= units 401-450= 6 units   Yes [provider]  lamoTRIgine (LAMICTAL) 25 MG tablet Take 25 mg by mouth daily. 01/16/21  Yes [provider]  latanoprost (XALATAN) 0.005 % ophthalmic solution Place 1 drop into both eyes at bedtime. 12/16/20  Yes [provider]  metoCLOPramide (REGLAN) 10 MG tablet Take 10 mg by mouth in the morning and at bedtime. 09/22/21  Yes [provider]  pravastatin (PRAVACHOL) 80 MG tablet Take 1 tablet (80 mg total) by mouth daily. 12/11/19  Yes [name], FNP  blood glucose meter kit and supplies KIT Dispense based on patient and insurance preference. Use up to four times daily as directed. (FOR ICD-10 E.11.9) 11/30/17   Scot Jun, FNP  diltiazem 2 % GEL Apply 1 application topically 5 (five) times daily. Patient not taking: Reported on 10/21/2021 06/19/21    Irene Shipper, MD  glucose blood (ONE TOUCH ULTRA TEST) test strip USE TO TEST 3 TIMES A DAY 08/15/20   [name], FNP  Insulin Pen Needle 29G X 12MM MISC E11.9 Dx Code Use once at bedtime with each nightly insulin 03/11/17   Scot Jun, FNP  Lancets [facility] ULTRASOFT) lancets Use as instructed 07/10/19   [name], FNP    Inpatient Medications: Scheduled Meds:  [MAR Hold] amLODipine  10 mg Oral Daily   [MAR Hold] atropine  1 drop Both Eyes BID   [MAR Hold] buPROPion  150 mg Oral Daily   [MAR Hold] calcium acetate  2,001 mg Oral TID WC   [MAR Hold] Chlorhexidine Gluconate Cloth  6 each Topical Q0600   [MAR Hold] darbepoetin (ARANESP) injection - DIALYSIS  100 mcg Intravenous Q Mon-HD   [MAR Hold]  dorzolamide-timolol  1 drop Both Eyes BID   [MAR Hold] feeding supplement (NEPRO CARB STEADY)  237 mL Oral BID BM   [MAR Hold] gabapentin  300 mg Oral q1800   [MAR Hold] heparin  5,000 Units Subcutaneous Q12H   [MAR Hold] hydrALAZINE  100 mg Oral Q8H   [MAR Hold] insulin aspart  0-6 Units Subcutaneous TID WC   [MAR Hold] labetalol  200 mg Oral BID   [MAR Hold] lamoTRIgine  25 mg Oral Daily   [MAR Hold] latanoprost  1 drop Both Eyes QHS   [MAR Hold] losartan  50 mg Oral Daily   [MAR Hold] multivitamin  1 tablet Oral QHS   [MAR Hold] pravastatin  80 mg Oral Daily   Continuous Infusions:  PRN Meds: [MAR Hold] acetaminophen, [MAR Hold] albuterol, [MAR Hold] benzonatate, [MAR Hold] HYDROcodone-acetaminophen, [MAR Hold]  HYDROmorphone (DILAUDID) injection, [MAR Hold] metoCLOPramide, [MAR Hold] senna-docusate  Allergies:    Allergies  Allergen Reactions   Acyclovir And Related Itching    Social History:   Social History   Socioeconomic History   Marital status: Divorced    Spouse name: Not on file   Number of children: 3   Years of education: 11   Highest education level: Not on file  Occupational History    Employer: DISABLED    Comment: Disabled  Tobacco  Use   Smoking status: Every Day    Packs/day: 0.25    Types: Cigarettes   Smokeless tobacco: Never   Tobacco comments:    trying to quit  Vaping Use   Vaping Use: Never used  Substance and Sexual Activity   Alcohol use: Yes    Comment: 2-3 beers weekly   Drug use: Not Currently    Types: Marijuana, Cocaine    Comment: quit in 2018   Sexual activity: Yes  Other Topics Concern   Not on file  Social History Narrative   Patient lives at home alone.   Disabled.   Right handed.   Education 11 th grade.   Caffeine three cups of coffee daily.   No psychiatrist   Social Determinants of Health   Financial Resource Strain: Not on file  Food Insecurity: Not on file  Transportation Needs: Not on file  Physical Activity: Not on file  Stress: Not on file  Social Connections: Not on file  Intimate Partner Violence: Not on file    Family History:    Family History  Problem Relation Age of Onset   High blood pressure Mother    Diabetes Mother    Thyroid disease Mother    Diabetes Father    High blood pressure Father    Cerebral palsy Daughter    Other Son        still born     ROS:  Please see the history of present illness.   All other ROS reviewed and negative.     Physical Exam/Data:   Vitals:   10/26/21 1247 10/26/21 1502 10/26/21 2010 10/27/21 0638  BP: 132/86 (!) 170/45 (!) 166/59 (!) 157/61  Pulse: 71  88 75  Resp: 20  20 16   Temp: 97.6 F (36.4 C)  98.7 F (37.1 C) 98.5 F (36.9 C)  TempSrc: Oral  Oral Oral  SpO2: 98%  99% 99%  Weight:       No intake or output data in the 24 hours ending 10/27/21 1427 Last 3 Weights 10/26/2021 10/26/2021 10/23/2021  Weight (lbs) 117 lb 15.1 oz 126 lb 1.7 oz  118 lb 9.7 oz  Weight (kg) 53.5 kg 57.2 kg 53.8 kg     Body mass index is 22.29 kg/m.  General:  Well nourished, well developed, in no acute distress HEENT: normal Neck: no JVD Vascular: No carotid bruits; Distal pulses 2+ bilaterally Cardiac:  normal S1, S2;  RRR; 2/6 systolic murmur LLSB Lungs: Rhonchorous breath sounds Abd: soft, nontender, no hepatomegaly  Ext: no edema Musculoskeletal:  No deformities, BUE and BLE strength normal and equal Skin: warm and dry  Neuro:  CNs 2-12 intact, no focal abnormalities noted Psych:  Normal affect   EKG:  The EKG was personally reviewed and demonstrates:  N/a  Telemetry:  Telemetry was personally reviewed and demonstrates:  SR 70s  Relevant CV Studies:  Echo: 09/18/21  IMPRESSIONS     1. Left ventricular ejection fraction, by estimation, is 60 to 65%. The  left ventricle has normal function. The left ventricle has no regional  wall motion abnormalities. There is moderate left ventricular hypertrophy.  Left ventricular diastolic  parameters are indeterminate.   2. Right ventricular systolic function is normal. The right ventricular  size is normal. There is mildly elevated pulmonary artery systolic  pressure.   3. The mitral valve is degenerative. Trivial mitral valve regurgitation.  No evidence of mitral stenosis. Moderate mitral annular calcification.   4. The aortic valve is tricuspid. There is moderate calcification of the  aortic valve. There is moderate thickening of the aortic valve. Aortic  valve regurgitation is not visualized. Mild aortic valve stenosis.   5. The inferior vena cava is normal in size with greater than 50%  respiratory variability, suggesting right atrial pressure of 3 mmHg.   FINDINGS   Left Ventricle: Left ventricular ejection fraction, by estimation, is 60  to 65%. The left ventricle has normal function. The left ventricle has no  regional wall motion abnormalities. The left ventricular internal cavity  size was normal in size. There is   moderate left ventricular hypertrophy. Left ventricular diastolic  parameters are indeterminate.   Right Ventricle: The right ventricular size is normal. No increase in  right ventricular wall thickness. Right ventricular  systolic function is  normal. There is mildly elevated pulmonary artery systolic pressure. The  tricuspid regurgitant velocity is 2.96   m/s, and with an assumed right atrial pressure of 8 mmHg, the estimated  right ventricular systolic pressure is 72.6 mmHg.   Left Atrium: Left atrial size was normal in size.   Right Atrium: Right atrial size was normal in size.   Pericardium: There is no evidence of pericardial effusion.   Mitral Valve: The mitral valve is degenerative in appearance. There is  mild thickening of the mitral valve leaflet(s). There is mild  calcification of the mitral valve leaflet(s). Moderate mitral annular  calcification. Trivial mitral valve regurgitation.  No evidence of mitral valve stenosis. MV peak gradient, 10.8 mmHg. The  mean mitral valve gradient is 4.0 mmHg.   Tricuspid Valve: The tricuspid valve is normal in structure. Tricuspid  valve regurgitation is mild . No evidence of tricuspid stenosis.   Aortic Valve: The aortic valve is tricuspid. There is moderate  calcification of the aortic valve. There is moderate thickening of the  aortic valve. Aortic valve regurgitation is not visualized. Mild aortic  stenosis is present. Aortic valve mean gradient  measures 10.5 mmHg. Aortic valve peak gradient measures 20.2 mmHg. Aortic  valve area, by VTI measures 1.65 cm.   Pulmonic Valve: The pulmonic valve was normal  in structure. Pulmonic valve  regurgitation is not visualized. No evidence of pulmonic stenosis.   Aorta: The aortic root is normal in size and structure.   Venous: The inferior vena cava is normal in size with greater than 50%  respiratory variability, suggesting right atrial pressure of 3 mmHg.   IAS/Shunts: No atrial level shunt detected by color flow Doppler.   PV angiogram: 10/27/2021  Impression:             #1  Severe infrainguinal occlusive disease bilaterally             #2  On the right, there is greater than 60-70% common femoral  stenosis.  In addition the superficial femoral artery is extremely small in caliber with multiple high-grade areas of stenosis.  Popliteal artery is very small in caliber and there is single-vessel runoff via a diseased peroneal artery             #3  The patient does not have good options for revascularization.  I think her best approach is going to be a right femoral endarterectomy in the operating room and simultaneous percutaneous intervention onto the peroneal artery as well as the superficial femoral-popliteal artery.  This has been scheduled for Friday.  Laboratory Data:  High Sensitivity Troponin:   Recent Labs  Lab 09/30/21 1200 09/30/21 1316 09/30/21 2118 09/30/21 2326  TROPONINIHS 65* 63* 63* 64*     Chemistry Recent Labs  Lab 10/23/21 1001 10/25/21 0200 10/27/21 0627  NA 134* 134* 134*  K 4.5 4.1 3.8  CL 98 94* 96*  CO2 23 27 26   GLUCOSE 143* 133* 112*  BUN 62* 37* 21*  CREATININE 11.99* 9.08* 6.47*  CALCIUM 8.8* 8.7* 9.5  GFRNONAA 3* 5* 7*  ANIONGAP 13 13 12     Recent Labs  Lab 10/23/21 1001 10/25/21 0200  PROT  --  6.9  ALBUMIN 2.8* 2.7*  AST  --  17  ALT  --  15  ALKPHOS  --  67  BILITOT  --  0.5   Lipids No results for input(s): CHOL, TRIG, HDL, LABVLDL, LDLCALC, CHOLHDL in the last 168 hours.  Hematology Recent Labs  Lab 10/23/21 1001 10/25/21 0200 10/27/21 0627  WBC 7.4 7.2 11.9*  RBC 2.43* 2.64* 2.78*  HGB 7.0* 7.3* 7.6*  HCT 22.2* 23.9* 25.3*  MCV 91.4 90.5 91.0  MCH 28.8 27.7 27.3  MCHC 31.5 30.5 30.0  RDW 16.1* 16.2* 16.1*  PLT 354 356 359   Thyroid No results for input(s): TSH, FREET4 in the last 168 hours.  BNPNo results for input(s): BNP, PROBNP in the last 168 hours.  DDimer No results for input(s): DDIMER in the last 168 hours.   Radiology/Studies:  CT ANGIO AO+BIFEM W & OR WO CONTRAST  Result Date: 10/24/2021 CLINICAL DATA:  Claudication. Right great toe gangrene. Abnormal right ABI, 0.35. EXAM: CT ANGIOGRAPHY OF  ABDOMINAL AORTA WITH ILIOFEMORAL RUNOFF TECHNIQUE: Multidetector CT imaging of the abdomen, pelvis and lower extremities was performed using the standard protocol during bolus administration of intravenous contrast. Multiplanar CT image reconstructions and MIPs were obtained to evaluate the vascular anatomy. CONTRAST:  169m OMNIPAQUE IOHEXOL 350 MG/ML SOLN COMPARISON:  CT abdomen and pelvis 05/15/2021 FINDINGS: VASCULAR Aorta: Diffuse atherosclerotic disease in the abdominal aorta without aneurysm, dissection or significant aortic stenosis. Celiac: Celiac trunk is patent with mixed plaque and mild stenosis. Again noted is a peripherally calcified structure in the region of the left adrenal gland and adjacent to the  main splenic artery. 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 A connection with the splenic artery is not identified. Main branches of the celiac trunk are patent. SMA: SMA is patent with diffuse calcifications. No significant stenosis. No evidence for aneurysm or dissection. Renals: Origin of bilateral renal arteries are patent. There is diffuse plaque throughout the mid and distal aspect of the bilateral renal arteries causing at least mild stenosis in bilateral renal arteries. There is no evidence for a renal artery aneurysm. IMA: Patent. RIGHT Lower Extremity Inflow: Right iliac arteries are heavily calcified. Origin of the right internal iliac artery appears to be occluded. Diffuse mild narrowing throughout the right external iliac artery. Outflow: Right common femoral artery is diffusely calcified but patent without significant stenosis. Extensive atherosclerotic disease involving the superficial femoral artery and profunda femoral arteries. Segmental areas of stenosis involving the proximal right SFA. Evidence for high-grade stenosis in the distal SFA. The proximal popliteal artery is occluded or there is severe stenosis. Popliteal artery is  very difficult evaluate due to the calcified walls. Popliteal artery appears to be occluded at the level of the knee joint. Runoff: Runoff vessels are diffusely calcified which limits evaluation for patency. Evidence for segmental occlusions in the posterior tibial artery. Reconstitution of the posterior tibial artery near the ankle. Peroneal artery is likely patent. There is probably flow within the anterior tibial artery. LEFT Lower Extremity Inflow: Left iliac arteries are heavily calcified. Occlusion in the proximal left internal iliac artery. Diffuse narrowing in the left common and left external iliac arteries without aneurysm or dissection. Focal stenosis which could be high-grade in the distal left external iliac artery on sequence 7 image 119. Outflow: Evidence for high-grade stenosis in the proximal left common femoral artery on sequence 7, image 126. High-grade stenosis near the origin the left SFA. Left profunda femoral arteries are patent but heavily diseased. Left SFA is diffusely diseased with the evidence of segmental occlusive disease in the mid and distal aspects. Limited evaluation of the left popliteal artery due to extensive atherosclerotic calcifications. Suspect areas of high-grade stenosis with segmental occlusive disease in the left popliteal artery. Runoff: Limited evaluation due to calcified vessels. Peroneal artery appears to be patent. Anterior tibial artery is diffusely diseased but appears to have some flow. Evidence for segmental occlusive disease in the posterior tibial artery. Distal reconstitution of the posterior tibial artery near the ankle. Veins: No obvious venous abnormality within the limitations of this arterial phase study. Review of the MIP images confirms the above findings. NON-VASCULAR Lower chest: Lung bases are clear. Hepatobiliary: No acute abnormality to the liver or gallbladder. Pancreas: Unremarkable. No pancreatic ductal dilatation or surrounding inflammatory  changes. Spleen: Normal in size without focal abnormality. Adrenals/Urinary Tract: Again noted is a peripherally calcified structure in the region left adrenal gland and this could be related to a thrombosed aneurysm. Stable appearance of the adrenal glands. Negative for hydronephrosis. There is fluid in the urinary bladder. Stomach/Bowel: No evidence for acute bowel inflammation or bowel distension. Lymphatic: No significant lymph node enlargement in the abdomen or pelvis. Reproductive: Status post hysterectomy. No adnexal masses. Other: Mild stranding along the inferior aspect the right hepatic lobe and this appears chronic. No evidence for ascites. Negative for free air. Small nodular structures in the anterior subcutaneous fat and suspect this is related to injection sites. Musculoskeletal: Small right knee joint effusion. No acute bone abnormality in the abdomen or pelvis. IMPRESSION: VASCULAR  1. Severe atherosclerotic disease throughout the abdomen, pelvis and bilateral lower extremities. 2. Severe bilateral outflow disease demonstrated by segmental areas of stenosis and occlusive disease in the superficial femoral arteries and popliteal arteries. High-grade stenosis in the proximal left common femoral artery. Difficult to accurately identify the areas of occlusive disease due to the heavily calcified vessels. 3. Bilateral runoff disease but limited evaluation due to the heavily calcified vessels. Suspect 2 vessel runoff bilaterally. 4. Bilateral iliac arteries are heavily calcified with areas of at least mild stenosis. Focal stenosis in the distal left external iliac artery. Occlusive disease involving the proximal internal iliac arteries bilaterally. 5. Stable peripherally calcified structure in the left upper abdomen near the splenic artery. This could represent a thrombosed aneurysm but the etiology is uncertain. NON-VASCULAR 1. No acute abnormality in the abdomen or pelvis. 2. Mild stranding near the  inferior right hepatic lobe and ascending colon. Findings are similar to the exam from 05/15/2021. Findings could be related to previous inflammation in this area. Electronically Signed   By: Markus Daft M.D.   On: 10/24/2021 15:58   PERIPHERAL VASCULAR CATHETERIZATION  Result Date: 10/27/2021 Images from the original result were not included. Patient name: Tammy Moses MRN: [id number] DOB: 1966/08/05 Sex: female 10/21/2021 - 10/27/2021 Pre-operative Diagnosis: Right toe ulcer Post-operative diagnosis:  Same Surgeon:  Annamarie Major Procedure Performed:  1.  Ultrasound-guided access, left femoral artery  2.  Abdominal aortogram  3.  Bilateral lower extremity runoff  4.  Second-order catheterization  5.  Conscious sedation, 36 minutes Indications: This is a 56 year old female with end-stage renal disease who has dry gangrene to her right great toe.  She comes in today for further evaluation. Procedure:  The patient was identified in the holding area and taken to room 8.  The patient was then placed supine on the table and prepped and draped in the usual sterile fashion.  A time out was called.  Conscious sedation was administered with the use of IV fentanyl and Versed under continuous physician and nurse monitoring.  Heart rate, blood pressure, and oxygen saturation were continuously monitored.  Total sedation time was 36 minutes.  Ultrasound was used to evaluate the left common femoral artery.  It was patent .  A digital ultrasound image was acquired.  A micropuncture needle was used to access the left common femoral artery under ultrasound guidance.  An 018 wire was advanced without resistance and a micropuncture sheath was placed.  The 018 wire was removed and a benson wire was placed.  The micropuncture sheath was exchanged for a 5 french sheath.  An omniflush catheter was advanced over the wire to the level of L-1.  An abdominal angiogram was obtained.  Next, using the omniflush catheter and a benson wire, the  aortic bifurcation was crossed and the catheter was placed into theright external iliac artery and right runoff was obtained.  left runoff was performed via retrograde sheath injections. Findings:  Aortogram: No significant renal artery stenosis was identified.  The infrarenal abdominal aorta is heavily calcified but widely patent.  The right common and external iliac artery heavily calcified but without significant stenosis.  Small caliber left common and external iliac arteries with moderate left common iliac stenosis.  Right Lower Extremity: Greater than 60% right common femoral artery calcific disease.  The profundofemoral artery is small in caliber.  It has significant disease distally.  The superficial femoral artery is also very small in caliber with multiple areas of high-grade lesion/occlusions.  Popliteal artery is a very small vessel.  There is single-vessel runoff via the peroneal artery which also has multiple areas of stenosis.  Left Lower Extremity: The left common femoral artery is nearly occluded.  A large profunda branch is visualized going down the leg.  The sheath appears to be occlusive and sewed the remaining portion of the leg was not visualized. Intervention:  none Impression:  #1  Severe infrainguinal occlusive disease bilaterally  #2  On the right, there is greater than 60-70% common femoral stenosis.  In addition the superficial femoral artery is extremely small in caliber with multiple high-grade areas of stenosis.  Popliteal artery is very small in caliber and there is single-vessel runoff via a diseased peroneal artery  #3  The patient does not have good options for revascularization.  I think her best approach is going to be a right femoral endarterectomy in the operating room and simultaneous percutaneous intervention onto the peroneal artery as well as the superficial femoral-popliteal artery.  This has been scheduled for Friday. Tammy Moses, M.D., FACS [facility] [place] Office: [phone] Pager:  [phone]     Assessment and Plan:   Tammy Moses is a 56 y.o. female with a hx of ESRD on HD, HTN, Anemia, COPD, blindness, tobacco use who is being seen 10/27/2021 for the evaluation of preop evaluation at the request of [name].  Pre op evaluation: In talking with patient she denies any anginal symptoms with her daily activities prior to admission.  She is somewhat limited in her mobility secondary to blindness in her right.  She does have risk factors for CAD but given her lack of anginal symptoms would not anticipate further cardiac work-up at this time unless significant abnormality noted on echo.  Does have a prominent murmur on exam therefore will update echo, as well as obtain twelve-lead EKG.  -- further recommendations pending echo/EKG  PAD/great toe gangrene: ABI with severe PAD.  CT angiogram with severe atherosclerotic disease throughout abdomen, pelvis and bilateral lower extremities.  Abdominal aortogram with lower extremity runoff done by [name] 1/3 noted above with plans for right femoral endarterectomy as well as percutaneous intervention to peroneal artery, femoral-popliteal arteries scheduled for Friday  Murmur: Prior echo with trivial MR noted, does have more prominent murmur on exam. --Check echo  ESRD on HD: Monday Wednesday Friday schedule --Per nephrology  Right patella nondisplaced fracture: Seen by orthopedic, recommendations for knee immobilizer.  PT/OT following  Chronic anemia: In the setting of ESRD --Per nephrology   For questions or updates, please contact [place] Please consult [email or web address] for contact info under    Signed, Reino Bellis, NP  10/27/2021 2:27 PM   I have personally seen and examined this patient. I agree with the assessment and plan as outlined above.  56 yo female with ESRD on HD, HTN, COPD with tobacco abuse, anemia, blindness admitted after a fall and  found to have severe PAD with limb ischemia, right toe gangrene. She has been followed by [name] with [facility] and underwent PV angiogram today. Severe right common femoral artery stenosis and severe disease from the knee down. Planning for right common femoral endarterectomy later this week. Cardiology consulted for pre-op risk assessment.  The pt has had no prior cardiac issues. CT chest with notation of scattered coronary artery calcification. She denies chest pain or dyspnea.  EKG from last week with no ischemic changes  Echo from November 2022 with normal LVEF  and trivial MR.  On exam she is thin female in NAD. CV:RRR with soft systolic murmur. Pulm: clear bilaterally Ext: no edema   Plan: I would get an EKG and update her echo given her murmur. If her LV function is normal and no ischemic changes on her EKG,l would proceed with her planned surgical procedure. We will follow along with you and see her again after her cardiac testing.   Lauree Chandler, MD  10/27/2021  4:12 PM

## 2021-10-27 NOTE — Interval H&P Note (Signed)
History and Physical Interval Note:  10/27/2021 1:09 PM  Tammy Moses  has presented today for surgery, with the diagnosis of claudication.  The various methods of treatment have been discussed with the patient and family. After consideration of risks, benefits and other options for treatment, the patient has consented to  Procedure(s): ABDOMINAL AORTOGRAM W/LOWER EXTREMITY (N/A) as a surgical intervention.  The patient's history has been reviewed, patient examined, no change in status, stable for surgery.  I have reviewed the patient's chart and labs.  Questions were answered to the patient's satisfaction.     Annamarie Major

## 2021-10-27 NOTE — Progress Notes (Signed)
Contacted by CSW this am regarding pt's HD needs at d/c. Pt is for snf placement at [facility] as long as pt has transportation from snf to [facility] on [address] in [place]. CSW checking on transportation. Navigator contacted [employer] and spoke to [place]. Pt received her last treatment with [facility] on 12/27. [facility] planned for pt to resume care at [facility] once pt returned home from snf. Contacted [facility] and was informed pt has not resumed at clinic since dc from snf. Informed [facility], spoke to [place], that pt is for new snf placement at [facility] as long as transportation available from snf to [facility]. Navigator will need to contact [facility] with pt's d/c date from hospital and date pt will resume at clinic. Pt's schedule will be MWF. Pt will need to arrive at 11:10 for 11:25 chair time. Appt info provided to CSW to provide to snf and transportation. Updated nephrologist as well regarding schedule change at d/c. Will assist.  Melven Sartorius Renal Navigator [phone]

## 2021-10-27 NOTE — Progress Notes (Signed)
[facility] Progress Note   56 y.o. female with a past medical history significant for DM2, HTN, COPD, admitted for patellar fracture.       Assessment/ Plan:   1) Patellar fracture: In immobilizer.  Management per primary team   2) ESRD: previously at [facility] now [place] MWF schedule for now Last HD through lt arm access on Monday with net 3L UF  Next HD tomorrow.  3) Hypertension/volume: Blood pressure fairly high but overall improved.  Volume removal with dialysis.  Currently on amlodipine 10 mg daily, losartan 50mg  daily and hydralazine 100 mg 3 times daily   4) Anemia secondary to ESRD: Hgb 7.3, may need transfusion soon. Iron sat 7 and ferritin 742.  IV iron ordered; will hold 2 doses if transfusion is required.  Aranesp 169mcg to be given on Monday   5) secondary hyperparathyroidism: Continue PhosLo.  PTH 429. Recheck phos tomorrow.   6) Diabetes Mellitus Type 2: Continue management per primary team   7) Gangrenous right toe: Related to peripheral arterial disease.  Vascular surgery following  Subjective:   Still has pain in the rt GT but states pain better after pain meds. Denies f/c/n/v.   Objective:   BP (!) 157/61    Pulse 75    Temp 98.5 F (36.9 C) (Oral)    Resp 16    Wt 53.5 kg    SpO2 99%    BMI 22.29 kg/m   Intake/Output Summary (Last 24 hours) at 10/27/2021 1108 Last data filed at 10/26/2021 1140 Gross per 24 hour  Intake --  Output 3000 ml  Net -3000 ml   Weight change:   Physical Exam: Constitutional: chronically ill appearing, NAD ENMT: ears and nose without scars or lesions CV: normal rate, no edema Respiratory: Bilateral chest rise, not tachypneic Gastrointestinal: SNDNT + BS Skin: gangrenous right toe, otherwise no visible lesions or rashes Psych: alert, judgement/insight appropriate Access: Lt BBT good thrill at inflow    Imaging: No results found.  Labs: BMET Recent Labs  Lab 10/21/21 1425  10/22/21 0309 10/23/21 1001 10/25/21 0200 10/27/21 0627  NA 137 134* 134* 134* 134*  K 4.5 4.9 4.5 4.1 3.8  CL 98 99 98 94* 96*  CO2 24 20* 23 27 26   GLUCOSE 106* 79 143* 133* 112*  BUN 49* 53* 62* 37* 21*  CREATININE 9.29* 10.16* 11.99* 9.08* 6.47*  CALCIUM 9.4 8.7* 8.8* 8.7* 9.5  PHOS  --   --  6.1*  --   --    CBC Recent Labs  Lab 10/21/21 1425 10/23/21 1001 10/25/21 0200 10/27/21 0627  WBC 11.3* 7.4 7.2 11.9*  NEUTROABS 7.6  --   --   --   HGB 8.9* 7.0* 7.3* 7.6*  HCT 29.1* 22.2* 23.9* 25.3*  MCV 92.1 91.4 90.5 91.0  PLT 507* 354 356 359    Medications:     amLODipine  10 mg Oral Daily   atropine  1 drop Both Eyes BID   buPROPion  150 mg Oral Daily   calcium acetate  2,001 mg Oral TID WC   Chlorhexidine Gluconate Cloth  6 each Topical Q0600   darbepoetin (ARANESP) injection - DIALYSIS  100 mcg Intravenous Q Mon-HD   dorzolamide-timolol  1 drop Both Eyes BID   feeding supplement (NEPRO CARB STEADY)  237 mL Oral BID BM   gabapentin  300 mg Oral q1800   heparin  5,000 Units Subcutaneous Q12H   hydrALAZINE  100 mg  Oral Q8H   insulin aspart  0-6 Units Subcutaneous TID WC   labetalol  200 mg Oral BID   lamoTRIgine  25 mg Oral Daily   latanoprost  1 drop Both Eyes QHS   losartan  50 mg Oral Daily   multivitamin  1 tablet Oral QHS   pravastatin  80 mg Oral Daily      Otelia Santee, MD 10/27/2021, 11:08 AM

## 2021-10-28 ENCOUNTER — Inpatient Hospital Stay (HOSPITAL_COMMUNITY): Payer: Medicare Other

## 2021-10-28 ENCOUNTER — Encounter (HOSPITAL_COMMUNITY): Payer: Self-pay | Admitting: Surgery

## 2021-10-28 DIAGNOSIS — D631 Anemia in chronic kidney disease: Secondary | ICD-10-CM

## 2021-10-28 DIAGNOSIS — R011 Cardiac murmur, unspecified: Secondary | ICD-10-CM | POA: Diagnosis not present

## 2021-10-28 DIAGNOSIS — Z01810 Encounter for preprocedural cardiovascular examination: Secondary | ICD-10-CM | POA: Diagnosis not present

## 2021-10-28 DIAGNOSIS — I96 Gangrene, not elsewhere classified: Secondary | ICD-10-CM | POA: Diagnosis not present

## 2021-10-28 DIAGNOSIS — S82091A Other fracture of right patella, initial encounter for closed fracture: Secondary | ICD-10-CM | POA: Diagnosis not present

## 2021-10-28 LAB — CBC WITH DIFFERENTIAL/PLATELET
Abs Immature Granulocytes: 0.08 10*3/uL — ABNORMAL HIGH (ref 0.00–0.07)
Basophils Absolute: 0.1 10*3/uL (ref 0.0–0.1)
Basophils Relative: 1 %
Eosinophils Absolute: 0.4 10*3/uL (ref 0.0–0.5)
Eosinophils Relative: 4 %
HCT: 25.6 % — ABNORMAL LOW (ref 36.0–46.0)
Hemoglobin: 7.9 g/dL — ABNORMAL LOW (ref 12.0–15.0)
Immature Granulocytes: 1 %
Lymphocytes Relative: 23 %
Lymphs Abs: 2.7 10*3/uL (ref 0.7–4.0)
MCH: 28.4 pg (ref 26.0–34.0)
MCHC: 30.9 g/dL (ref 30.0–36.0)
MCV: 92.1 fL (ref 80.0–100.0)
Monocytes Absolute: 1.3 10*3/uL — ABNORMAL HIGH (ref 0.1–1.0)
Monocytes Relative: 12 %
Neutro Abs: 6.9 10*3/uL (ref 1.7–7.7)
Neutrophils Relative %: 59 %
Platelets: 356 10*3/uL (ref 150–400)
RBC: 2.78 MIL/uL — ABNORMAL LOW (ref 3.87–5.11)
RDW: 16.4 % — ABNORMAL HIGH (ref 11.5–15.5)
WBC: 11.5 10*3/uL — ABNORMAL HIGH (ref 4.0–10.5)
nRBC: 0 % (ref 0.0–0.2)

## 2021-10-28 LAB — RENAL FUNCTION PANEL
Albumin: 3 g/dL — ABNORMAL LOW (ref 3.5–5.0)
Anion gap: 12 (ref 5–15)
BUN: 30 mg/dL — ABNORMAL HIGH (ref 6–20)
CO2: 26 mmol/L (ref 22–32)
Calcium: 9.8 mg/dL (ref 8.9–10.3)
Chloride: 95 mmol/L — ABNORMAL LOW (ref 98–111)
Creatinine, Ser: 8.57 mg/dL — ABNORMAL HIGH (ref 0.44–1.00)
GFR, Estimated: 5 mL/min — ABNORMAL LOW
Glucose, Bld: 110 mg/dL — ABNORMAL HIGH (ref 70–99)
Phosphorus: 5.3 mg/dL — ABNORMAL HIGH (ref 2.5–4.6)
Potassium: 4 mmol/L (ref 3.5–5.1)
Sodium: 133 mmol/L — ABNORMAL LOW (ref 135–145)

## 2021-10-28 LAB — GLUCOSE, CAPILLARY
Glucose-Capillary: 117 mg/dL — ABNORMAL HIGH (ref 70–99)
Glucose-Capillary: 96 mg/dL (ref 70–99)
Glucose-Capillary: 121 mg/dL — ABNORMAL HIGH (ref 70–99)
Glucose-Capillary: 135 mg/dL — ABNORMAL HIGH (ref 70–99)

## 2021-10-28 LAB — ECHOCARDIOGRAM COMPLETE
AR max vel: 1.56 cm2
AV Peak grad: 15.1 mmHg
Ao pk vel: 1.94 m/s
Area-P 1/2: 3.68 cm2
Calc EF: 57.6 %
Height: 61 in
MV VTI: 1.47 cm2
S' Lateral: 3.6 cm
Single Plane A2C EF: 58.5 %
Single Plane A4C EF: 58.9 %
Weight: 1883.61 [oz_av]

## 2021-10-28 NOTE — H&P (View-Only) (Signed)
[facility] [place]  Subjective  - No new complaints   Objective 139/88 65 97.9 F (36.6 C) (Oral) 13 98% No intake or output data in the 24 hours ending 10/28/21 0656  Left groin soft without hematoma Right GT dry gangrene, foot warm to touch Lungs non labored breathing  Assessment/Planning: POD #1 angiogram  right femoral endarterectomy in the operating room and simultaneous percutaneous intervention onto the peroneal artery as well as the superficial femoral-popliteal artery. Plan for Friday 10/30/21.  No change in right GT wound dry gangrene Has HD today Will pre-op tomorrow Asymptomatic chronic anemia with ESRD   Tammy Moses 10/28/2021 6:56 AM --  Laboratory Lab Results: Recent Labs    10/27/21 0627 10/28/21 0430  WBC 11.9* 11.5*  HGB 7.6* 7.9*  HCT 25.3* 25.6*  PLT 359 356   BMET Recent Labs    10/27/21 0627  NA 134*  K 3.8  CL 96*  CO2 26  GLUCOSE 112*  BUN 21*  CREATININE 6.47*  CALCIUM 9.5    COAG Lab Results  Component Value Date   INR 1.2 10/21/2021   INR 1.2 09/16/2021   INR 0.88 01/30/2014   No results found for: PTT   I agree with the above.  I have seen and evaluated the patient.  She underwent angiography yesterday which revealed severe multilevel disease.  I felt she was best treated with femoral endarterectomy and superficial femoral and tibial angioplasty as well as toe amputation.  I have asked cardiology to evaluate her for preoperative clearance.  I am going to get vein mapping in case bypass is contemplated.  I will also plan on removing her great toe which has dry gangrene.  I discussed with her that because of her distal disease that she is at high risk for nonhealing which could ultimately lead to a below-knee versus above-knee amputation.  She understands and wants to proceed.  She is been scheduled for surgery on Friday  Tammy Moses

## 2021-10-28 NOTE — Progress Notes (Addendum)
[facility] [place]  Subjective  - No new complaints   Objective 139/88 65 97.9 F (36.6 C) (Oral) 13 98% No intake or output data in the 24 hours ending 10/28/21 0656  Left groin soft without hematoma Right GT dry gangrene, foot warm to touch Lungs non labored breathing  Assessment/Planning: POD #1 angiogram  right femoral endarterectomy in the operating room and simultaneous percutaneous intervention onto the peroneal artery as well as the superficial femoral-popliteal artery. Plan for Friday 10/30/21.  No change in right GT wound dry gangrene Has HD today Will pre-op tomorrow Asymptomatic chronic anemia with ESRD   [name] 10/28/2021 6:56 AM --  Laboratory Lab Results: Recent Labs    10/27/21 0627 10/28/21 0430  WBC 11.9* 11.5*  HGB 7.6* 7.9*  HCT 25.3* 25.6*  PLT 359 356   BMET Recent Labs    10/27/21 0627  NA 134*  K 3.8  CL 96*  CO2 26  GLUCOSE 112*  BUN 21*  CREATININE 6.47*  CALCIUM 9.5    COAG Lab Results  Component Value Date   INR 1.2 10/21/2021   INR 1.2 09/16/2021   INR 0.88 01/30/2014   No results found for: PTT   I agree with the above.  I have seen and evaluated the patient.  She underwent angiography yesterday which revealed severe multilevel disease.  I felt she was best treated with femoral endarterectomy and superficial femoral and tibial angioplasty as well as toe amputation.  I have asked cardiology to evaluate her for preoperative clearance.  I am going to get vein mapping in case bypass is contemplated.  I will also plan on removing her great toe which has dry gangrene.  I discussed with her that because of her distal disease that she is at high risk for nonhealing which could ultimately lead to a below-knee versus above-knee amputation.  She understands and wants to proceed.  She is been scheduled for surgery on Friday  [place] Tammy Moses

## 2021-10-28 NOTE — Progress Notes (Signed)
PROGRESS NOTE    Tammy Moses  JYN:[id number] DOB: 10-07-1966 DOA: 10/21/2021 PCP: [name], FNP   Chief Complain: Right knee pain  Brief Narrative: Patient is a 56 year old female with history of ESRD on dialysis MWF, hypertension, normocytic anemia, COPD, blindness, tobacco use who presented with after falling down on her right knee when she slipped on ice.  She was unable to stand up and complained of pain and swelling of right knee and was brought to the emergency department.  Imaging showed nondisplaced right patella fracture on x-ray.  Orthopedics was consulted and recommended right knee brace with PT and follow-up as an outpatient.  Patient was also found to have right toe gangrene, ABI showed severe peripheral artery disease.  Vascular surgery following, underwent  left lower extremity arteriography on 1/3 with finding of severe infrainguinal occlusive disease bilaterally.  She has been planned for right femoral endarterectomy and percutaneous intervention  on Friday.  PT/OT recommending SNF on DC,TOC consulted  Assessment & Plan:   Principal Problem:   Closed patellar sleeve fracture of right knee Active Problems:   Patellar sleeve fracture of right knee, closed, initial encounter   Gangrene of foot ([place])  Right knee patella nondisplaced fracture: Slipped on ice and fell on right knee.  Orthopedics consulted, recommended knee immobilizer, follow-up as an outpatient with orthopedics.  Currently on knee mobilizer.  PT/OT consulted recommended skilled NF on discharge.  Right big toe gangrene: ABI showed severe peripheral artery disease.  CT angiogram showed severe atherosclerotic disease throughout the abdomen, pelvis and bilateral lower extremities,severe bilateral outflow disease .Vascular surgery following and underwent  left lower extremity arteriography on 1/3 with finding of severe infrainguinal occlusive disease bilaterally.  She has been planned for right femoral  endarterectomy and percutaneous intervention  on Friday. Cardiology consulted for clearance for surgery, pending echocardiogram.  ESRD on dialysis: Dialyzed on Monday, Wednesday, Friday. nephrology following  Diabetes type 2: Currently on sliding insulin scale.  Monitor blood sugars  Anemia of chronic disease: Hemoglobin in the range of 7.  Iron panel showed severe iron deficiency, given IV iron.  Also aranesp.  Monitor CBC.  Will transfuse if hemoglobin drops less than 7.  Hypertension: Monitor blood pressure.  Continue amlodipine, hydralazine, labetalol, losartan  Debility/deconditioning: PT/OT recommending skilled nursing facility on discharge.  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  Social worker following       DVT prophylaxis:Hep [place] Code Status: Full Family Communication:: called and discussed with niece on phone on 1/4 Patient status:Inpatient  Dispo: The patient is from: Home              Anticipated d/c is to:SNF               Anticipated d/c date is: Awaiting vascular surgery procedure  Consultants: Nephrology, vascular surgery, orthopedics  Procedures: Dialysis, arteriogram  Antimicrobials:  Anti-infectives (From admission, onward)    None       Subjective:  Patient seen and examined at the bedside this morning in the dialysis suite.  Hemodynamically stable ,comfortable, denies any new complaints  objective: Vitals:   10/27/21 1959 10/27/21 2309 10/28/21 0435 10/28/21 0626  BP: (!) 167/62 (!) 152/58 139/88   Pulse: 69 70 65   Resp: 17 15 13    Temp: 98 F (36.7 C) 98.4 F (36.9 C) 97.9 F (36.6 C)   TempSrc: Oral Oral Oral   SpO2: 97% 96% 98%   Weight:    53.5 kg  Height:  No intake or output data in the 24 hours ending 10/28/21 0744  Filed Weights   10/26/21 1212 10/27/21 1439 10/28/21 0626  Weight: 53.5 kg 53.5 kg 53.5 kg    Examination:  General exam: Overall comfortable, not in distress, chronically ill  looking HEENT: Blind in right  eye, cataract  respiratory system:  no wheezes or crackles  Cardiovascular system: S1 & S2 heard, RRR.  Gastrointestinal system: Abdomen is nondistended, soft and nontender. Central nervous system: Alert and oriented Extremities: No edema, no clubbing ,no cyanosis, AV fistula on the left arm, dry gangrene of the left right big toe, knee immobilizer on the left Skin: No rashes, no ulcers,no icterus    Data Reviewed: I have personally reviewed following labs and imaging studies  CBC: Recent Labs  Lab 10/21/21 1425 10/23/21 1001 10/25/21 0200 10/27/21 0627 10/28/21 0430  WBC 11.3* 7.4 7.2 11.9* 11.5*  NEUTROABS 7.6  --   --   --  6.9  HGB 8.9* 7.0* 7.3* 7.6* 7.9*  HCT 29.1* 22.2* 23.9* 25.3* 25.6*  MCV 92.1 91.4 90.5 91.0 92.1  PLT 507* 354 356 359 096   Basic Metabolic Panel: Recent Labs  Lab 10/21/21 1425 10/22/21 0309 10/23/21 1001 10/25/21 0200 10/27/21 0627 10/27/21 1453  NA 137 134* 134* 134* 134*  --   K 4.5 4.9 4.5 4.1 3.8  --   CL 98 99 98 94* 96*  --   CO2 24 20* 23 27 26   --   GLUCOSE 106* 79 143* 133* 112*  --   BUN 49* 53* 62* 37* 21*  --   CREATININE 9.29* 10.16* 11.99* 9.08* 6.47*  --   CALCIUM 9.4 8.7* 8.8* 8.7* 9.5  --   PHOS  --   --  6.1*  --   --  4.4   GFR: Estimated Creatinine Clearance: 7.2 mL/min (A) (by C-G formula based on SCr of 6.47 mg/dL (H)). Liver Function Tests: Recent Labs  Lab 10/23/21 1001 10/25/21 0200  AST  --  17  ALT  --  15  ALKPHOS  --  67  BILITOT  --  0.5  PROT  --  6.9  ALBUMIN 2.8* 2.7*   No results for input(s): LIPASE, AMYLASE in the last 168 hours. No results for input(s): AMMONIA in the last 168 hours. Coagulation Profile: Recent Labs  Lab 10/21/21 1425  INR 1.2   Cardiac Enzymes: No results for input(s): CKTOTAL, CKMB, CKMBINDEX, TROPONINI in the last 168 hours. BNP (last 3 results) No results for input(s): PROBNP in the last 8760 hours. HbA1C: No results for input(s): HGBA1C in the last 72  hours. CBG: Recent Labs  Lab 10/27/21 0902 10/27/21 1157 10/27/21 1710 10/27/21 2154 10/28/21 0618  GLUCAP 105* 103* 109* 115* 117*   Lipid Profile: No results for input(s): CHOL, HDL, LDLCALC, TRIG, CHOLHDL, LDLDIRECT in the last 72 hours. Thyroid Function Tests: No results for input(s): TSH, T4TOTAL, FREET4, T3FREE, THYROIDAB in the last 72 hours. Anemia Panel: No results for input(s): VITAMINB12, FOLATE, FERRITIN, TIBC, IRON, RETICCTPCT in the last 72 hours.  Sepsis Labs: No results for input(s): PROCALCITON, LATICACIDVEN in the last 168 hours.  Recent Results (from the past 240 hour(s))  Resp Panel by RT-PCR (Flu A&B, Covid) Nasopharyngeal Swab     Status: None   Collection Time: 10/21/21  2:51 PM   Specimen: Nasopharyngeal Swab; Nasopharyngeal(NP) swabs in vial transport medium  Result Value Ref Range Status   SARS Coronavirus 2 by RT PCR NEGATIVE  NEGATIVE Final    Comment: (NOTE) SARS-CoV-2 target nucleic acids are NOT DETECTED.  The SARS-CoV-2 RNA is generally detectable in upper respiratory specimens during the acute phase of infection. The lowest concentration of SARS-CoV-2 viral copies this assay can detect is 138 copies/mL. A negative result does not preclude SARS-Cov-2 infection and should not be used as the sole basis for treatment or other patient management decisions. A negative result may occur with  improper specimen collection/handling, submission of specimen other than nasopharyngeal swab, presence of viral mutation(s) within the areas targeted by this assay, and inadequate number of viral copies(<138 copies/mL). A negative result must be combined with clinical observations, patient history, and epidemiological information. The expected result is Negative.  Fact Sheet for Patients:  EntrepreneurPulse.com.au  Fact Sheet for Healthcare Providers:  IncredibleEmployment.be  This test is no t yet approved or cleared by  the [place] FDA and  has been authorized for detection and/or diagnosis of SARS-CoV-2 by FDA under an Emergency Use Authorization (EUA). This EUA will remain  in effect (meaning this test can be used) for the duration of the COVID-19 declaration under Section 564(b)(1) of the Act, 21 U.S.C.section 360bbb-3(b)(1), unless the authorization is terminated  or revoked sooner.       Influenza A by PCR NEGATIVE NEGATIVE Final   Influenza B by PCR NEGATIVE NEGATIVE Final    Comment: (NOTE) The Xpert Xpress SARS-CoV-2/FLU/RSV plus assay is intended as an aid in the diagnosis of influenza from Nasopharyngeal swab specimens and should not be used as a sole basis for treatment. Nasal washings and aspirates are unacceptable for Xpert Xpress SARS-CoV-2/FLU/RSV testing.  Fact Sheet for Patients: EntrepreneurPulse.com.au  Fact Sheet for Healthcare Providers: IncredibleEmployment.be  This test is not yet approved or cleared by the [place] FDA and has been authorized for detection and/or diagnosis of SARS-CoV-2 by FDA under an Emergency Use Authorization (EUA). This EUA will remain in effect (meaning this test can be used) for the duration of the COVID-19 declaration under Section 564(b)(1) of the Act, 21 U.S.C. section 360bbb-3(b)(1), unless the authorization is terminated or revoked.  Performed at [facility], [address], [place], [place] [address]          Radiology Studies: PERIPHERAL VASCULAR CATHETERIZATION  Result Date: 10/27/2021 Images from the original result were not included. Patient name: Tammy Moses MRN: [id number] DOB: [birth year] Sex: female 10/21/2021 - 10/27/2021 Pre-operative Diagnosis: Right toe ulcer Post-operative diagnosis:  Same Surgeon:  Annamarie Major Procedure Performed:  1.  Ultrasound-guided access, left femoral artery  2.  Abdominal aortogram  3.  Bilateral lower extremity runoff  4.  Second-order  catheterization  5.  Conscious sedation, 36 minutes Indications: This is a 56 year old female with end-stage renal disease who has dry gangrene to her right great toe.  She comes in today for further evaluation. Procedure:  The patient was identified in the holding area and taken to room 8.  The patient was then placed supine on the table and prepped and draped in the usual sterile fashion.  A time out was called.  Conscious sedation was administered with the use of IV fentanyl and Versed under continuous physician and nurse monitoring.  Heart rate, blood pressure, and oxygen saturation were continuously monitored.  Total sedation time was 36 minutes.  Ultrasound was used to evaluate the left common femoral artery.  It was patent .  A digital ultrasound image was acquired.  A micropuncture needle was used to access the left  common femoral artery under ultrasound guidance.  An 018 wire was advanced without resistance and a micropuncture sheath was placed.  The 018 wire was removed and a benson wire was placed.  The micropuncture sheath was exchanged for a 5 french sheath.  An omniflush catheter was advanced over the wire to the level of L-1.  An abdominal angiogram was obtained.  Next, using the omniflush catheter and a benson wire, the aortic bifurcation was crossed and the catheter was placed into theright external iliac artery and right runoff was obtained.  left runoff was performed via retrograde sheath injections. Findings:  Aortogram: No significant renal artery stenosis was identified.  The infrarenal abdominal aorta is heavily calcified but widely patent.  The right common and external iliac artery heavily calcified but without significant stenosis.  Small caliber left common and external iliac arteries with moderate left common iliac stenosis.  Right Lower Extremity: Greater than 60% right common femoral artery calcific disease.  The profundofemoral artery is small in caliber.  It has significant disease  distally.  The superficial femoral artery is also very small in caliber with multiple areas of high-grade lesion/occlusions.  Popliteal artery is a very small vessel.  There is single-vessel runoff via the peroneal artery which also has multiple areas of stenosis.  Left Lower Extremity: The left common femoral artery is nearly occluded.  A large profunda branch is visualized going down the leg.  The sheath appears to be occlusive and sewed the remaining portion of the leg was not visualized. Intervention:  none Impression:  #1  Severe infrainguinal occlusive disease bilaterally  #2  On the right, there is greater than 60-70% common femoral stenosis.  In addition the superficial femoral artery is extremely small in caliber with multiple high-grade areas of stenosis.  Popliteal artery is very small in caliber and there is single-vessel runoff via a diseased peroneal artery  #3  The patient does not have good options for revascularization.  I think her best approach is going to be a right femoral endarterectomy in the operating room and simultaneous percutaneous intervention onto the peroneal artery as well as the superficial femoral-popliteal artery.  This has been scheduled for Friday. Theotis Burrow, M.D., FACS [facility] Office: [phone] Pager:  [phone]        Scheduled Meds:  amLODipine  10 mg Oral Daily   atropine  1 drop Both Eyes BID   buPROPion  150 mg Oral Daily   calcium acetate  2,001 mg Oral TID WC   Chlorhexidine Gluconate Cloth  6 each Topical Q0600   darbepoetin (ARANESP) injection - DIALYSIS  100 mcg Intravenous Q Mon-HD   dorzolamide-timolol  1 drop Both Eyes BID   feeding supplement (NEPRO CARB STEADY)  237 mL Oral BID BM   gabapentin  300 mg Oral q1800   heparin  5,000 Units Subcutaneous Q12H   hydrALAZINE  100 mg Oral Q8H   insulin aspart  0-6 Units Subcutaneous TID WC   labetalol  200 mg Oral BID   lamoTRIgine  25 mg Oral Daily    latanoprost  1 drop Both Eyes QHS   losartan  50 mg Oral Daily   multivitamin  1 tablet Oral QHS   pravastatin  80 mg Oral Daily   sodium chloride flush  3 mL Intravenous Q12H   Continuous Infusions:  sodium chloride        LOS: 5 days    Time spent: 25 mins.More than 50%  of that time was spent in counseling and/or coordination of care.      Shelly Coss, MD [facility] P1/01/2022, 7:44 AM

## 2021-10-28 NOTE — Progress Notes (Signed)
Occupational Therapy Treatment Patient Details Name: Tammy Moses MRN: [id number] DOB: 17-Apr-1966 Today's Date: 10/28/2021   History of present illness Pt is a 56 y/o female presenting to the ED on 12/28 secondary to fall. Found to have R patellar fx. PMH includes ESRD on HD, HTN, DM, COPD, blindness, tobacco abuse.   OT comments  Pt progressing towards goals this session, improving with activity tolerance and transfers. Pt currently min guard for ADLs, supervision for bed mobility, and min A for transfers. Reviewed WB precautions with pt, pt able to verbalize and adhere to precautions throughout session. Pt continues to present with impairments listed below, will continue to follow in the acute setting. Recommend SNF at d/c.   Recommendations for follow up therapy are one component of a multi-disciplinary discharge planning process, led by the attending physician.  Recommendations may be updated based on patient status, additional functional criteria and insurance authorization.    Follow Up Recommendations  Skilled nursing-short term rehab (<3 hours/day)    Assistance Recommended at Discharge Frequent or constant Supervision/Assistance  Patient can return home with the following  A lot of help with walking and/or transfers;Two people to help with walking and/or transfers;A lot of help with bathing/dressing/bathroom;Assistance with cooking/housework;Help with stairs or ramp for entrance;Assist for transportation   Equipment Recommendations  BSC/3in1;None recommended by OT;Other (comment) (defer to next venue of care)    Recommendations for Other Services PT consult    Precautions / Restrictions Precautions Precautions: Fall;Other (comment) Precaution Comments: blind, R foot drop Required Braces or Orthoses: Knee Immobilizer - Right Knee Immobilizer - Right: On at all times Restrictions Weight Bearing Restrictions: Yes RLE Weight Bearing: Non weight bearing       Mobility Bed  Mobility Overal bed mobility: Needs Assistance Bed Mobility: Supine to Sit;Sit to Supine     Supine to sit: Supervision Sit to supine: Supervision        Transfers Overall transfer level: Needs assistance Equipment used: Rolling walker (2 wheels) Transfers: Sit to/from Stand Sit to Stand: Min assist           General transfer comment: Assist to power to standing with cues for hand placement/technique, stood from EOB x1, from chair x1, compliant with NWB with transitions.     Balance Overall balance assessment: Needs assistance Sitting-balance support: Feet supported;No upper extremity supported Sitting balance-Leahy Scale: Good     Standing balance support: During functional activity;Reliant on assistive device for balance Standing balance-Leahy Scale: Poor Standing balance comment: Reliant on BUE support                           ADL either performed or assessed with clinical judgement   ADL Overall ADL's : Needs assistance/impaired                         Toilet Transfer: Min guard;Rolling walker (2 wheels);Ambulation;Regular Glass blower/designer Details (indicate cue type and reason): simulated in room         Functional mobility during ADLs: Min guard      Extremity/Trunk Assessment Upper Extremity Assessment Upper Extremity Assessment: Overall WFL for tasks assessed   Lower Extremity Assessment Lower Extremity Assessment: Defer to PT evaluation        Vision   Vision Assessment?: Vision impaired- to be further tested in functional context Additional Comments: pt is blind   Perception Perception Perception: Not tested   Praxis Praxis Praxis: Not  tested    Cognition Arousal/Alertness: Awake/alert Behavior During Therapy: WFL for tasks assessed/performed Overall Cognitive Status: No family/caregiver present to determine baseline cognitive functioning                                             Exercises     Shoulder Instructions       General Comments KI remained on throughout session    Pertinent Vitals/ Pain       Pain Assessment: Faces Pain Score: 4  Faces Pain Scale: Hurts little more Pain Location: RLE Pain Descriptors / Indicators: Discomfort;Sore Pain Intervention(s): Limited activity within patient's tolerance;Monitored during session;Repositioned  Home Living                                          Prior Functioning/Environment              Frequency  Min 2X/week        Progress Toward Goals  OT Goals(current goals can now be found in the care plan section)  Progress towards OT goals: Progressing toward goals  Acute Rehab OT Goals Patient Stated Goal: none stated OT Goal Formulation: With patient Time For Goal Achievement: 11/04/21 Potential to Achieve Goals: Good ADL Goals Pt Will Perform Grooming: with supervision;with set-up;standing Pt Will Perform Upper Body Bathing: with set-up;with supervision;sitting Pt Will Perform Lower Body Bathing: with min assist;with min guard assist Pt Will Perform Upper Body Dressing: with supervision;with set-up;sitting Pt Will Perform Lower Body Dressing: with min assist;sit to/from stand Pt Will Transfer to Toilet: with min assist;stand pivot transfer;bedside commode Pt Will Perform Toileting - Clothing Manipulation and hygiene: with min assist;sit to/from stand Additional ADL Goal #1: Pt will follow simple commands 100% of the time with no cuing.  Plan Discharge plan remains appropriate;Frequency remains appropriate    Co-evaluation                 AM-PAC OT "6 Clicks" Daily Activity     Outcome Measure   Help from another person eating meals?: None Help from another person taking care of personal grooming?: A Little Help from another person toileting, which includes using toliet, bedpan, or urinal?: A Lot Help from another person bathing (including washing, rinsing,  drying)?: A Lot Help from another person to put on and taking off regular upper body clothing?: A Little Help from another person to put on and taking off regular lower body clothing?: Total 6 Click Score: 15    End of Session Equipment Utilized During Treatment: Rolling walker (2 wheels);Gait belt;Right knee immobilizer  OT Visit Diagnosis: Unsteadiness on feet (R26.81);Muscle weakness (generalized) (M62.81);Low vision, both eyes (H54.2)   Activity Tolerance Patient tolerated treatment well   Patient Left in bed;with call bell/phone within reach;with bed alarm set   Nurse Communication Mobility status        Time: 5638-9373 OT Time Calculation (min): 18 min  Charges: OT General Charges $OT Visit: 1 Visit OT Treatments $Self Care/Home Management : 8-22 mins  Lynnda Child, OTD, OTR/L Acute Rehab [phone] - [place] 10/28/2021, 3:33 PM

## 2021-10-28 NOTE — Progress Notes (Signed)
[facility] Progress Note   56 y.o. female with a past medical history significant for DM2, HTN, COPD, admitted for patellar fracture.       Assessment/ Plan:   1) Patellar fracture: In immobilizer.  Management per primary team   2) ESRD: previously at [facility] now [place] MWF schedule for now Seen on  HD through lt arm access 3K bath  2.5L net UF goal  Having to adjust needles  Next HD Fri.  3) Hypertension/volume: Blood pressure fairly high but overall improved.  Volume removal with dialysis.  Currently on amlodipine 10 mg daily, losartan 50mg  daily and hydralazine 100 mg 3 times daily   4) Anemia secondary to ESRD: Hgb 7.3, may need transfusion soon. Iron sat 7 and ferritin 742.  IV iron ordered; will hold 2 doses if transfusion is required.  Aranesp 171mcg to be given on Monday   5) secondary hyperparathyroidism: Continue PhosLo.  PTH 429. Recheck phos tomorrow.   6) Diabetes Mellitus Type 2: Continue management per primary team   7) Gangrenous right toe: Related to peripheral arterial disease.  Vascular surgery following -> planned rt fem endarterectomy and intervention to SFA/pop and peroneal on 1/6; dry gangrene rt GT  Subjective:   Still has pain in the rt GT but states pain better after pain meds. Denies f/c/n/v.   Objective:   BP (!) 183/53    Pulse 68    Temp 98 F (36.7 C) (Oral)    Resp (!) 8    Ht 5\' 1"  (1.549 m)    Wt 55.9 kg    SpO2 100%    BMI 23.29 kg/m   Intake/Output Summary (Last 24 hours) at 10/28/2021 1011 Last data filed at 10/28/2021 0830 Gross per 24 hour  Intake --  Output 100 ml  Net -100 ml   Weight change: -3.7 kg  Physical Exam: Constitutional: chronically ill appearing, NAD ENMT: ears and nose without scars or lesions CV: normal rate, no edema Respiratory: Bilateral chest rise, not tachypneic Gastrointestinal: SNDNT + BS Skin: gangrenous right toe, otherwise no visible lesions or rashes Psych: alert,  judgement/insight appropriate Access: Lt BBT good thrill at inflow    Imaging: PERIPHERAL VASCULAR CATHETERIZATION  Result Date: 10/27/2021 Images from the original result were not included. Patient name: JULLISA GRIGORYAN MRN: [id number] DOB: 08-03-1966 Sex: female 10/21/2021 - 10/27/2021 Pre-operative Diagnosis: Right toe ulcer Post-operative diagnosis:  Same Surgeon:  Annamarie Major Procedure Performed:  1.  Ultrasound-guided access, left femoral artery  2.  Abdominal aortogram  3.  Bilateral lower extremity runoff  4.  Second-order catheterization  5.  Conscious sedation, 36 minutes Indications: This is a 56 year old female with end-stage renal disease who has dry gangrene to her right great toe.  She comes in today for further evaluation. Procedure:  The patient was identified in the holding area and taken to room 8.  The patient was then placed supine on the table and prepped and draped in the usual sterile fashion.  A time out was called.  Conscious sedation was administered with the use of IV fentanyl and Versed under continuous physician and nurse monitoring.  Heart rate, blood pressure, and oxygen saturation were continuously monitored.  Total sedation time was 36 minutes.  Ultrasound was used to evaluate the left common femoral artery.  It was patent .  A digital ultrasound image was acquired.  A micropuncture needle was used to access the left common femoral artery under ultrasound guidance.  An  018 wire was advanced without resistance and a micropuncture sheath was placed.  The 018 wire was removed and a benson wire was placed.  The micropuncture sheath was exchanged for a 5 french sheath.  An omniflush catheter was advanced over the wire to the level of L-1.  An abdominal angiogram was obtained.  Next, using the omniflush catheter and a benson wire, the aortic bifurcation was crossed and the catheter was placed into theright external iliac artery and right runoff was obtained.  left runoff was performed  via retrograde sheath injections. Findings:  Aortogram: No significant renal artery stenosis was identified.  The infrarenal abdominal aorta is heavily calcified but widely patent.  The right common and external iliac artery heavily calcified but without significant stenosis.  Small caliber left common and external iliac arteries with moderate left common iliac stenosis.  Right Lower Extremity: Greater than 60% right common femoral artery calcific disease.  The profundofemoral artery is small in caliber.  It has significant disease distally.  The superficial femoral artery is also very small in caliber with multiple areas of high-grade lesion/occlusions.  Popliteal artery is a very small vessel.  There is single-vessel runoff via the peroneal artery which also has multiple areas of stenosis.  Left Lower Extremity: The left common femoral artery is nearly occluded.  A large profunda branch is visualized going down the leg.  The sheath appears to be occlusive and sewed the remaining portion of the leg was not visualized. Intervention:  none Impression:  #1  Severe infrainguinal occlusive disease bilaterally  #2  On the right, there is greater than 60-70% common femoral stenosis.  In addition the superficial femoral artery is extremely small in caliber with multiple high-grade areas of stenosis.  Popliteal artery is very small in caliber and there is single-vessel runoff via a diseased peroneal artery  #3  The patient does not have good options for revascularization.  I think her best approach is going to be a right femoral endarterectomy in the operating room and simultaneous percutaneous intervention onto the peroneal artery as well as the superficial femoral-popliteal artery.  This has been scheduled for Friday. Theotis Burrow, M.D., FACS [facility] Office: [phone] Pager:  [phone]    Labs: BMET Recent Labs  Lab 10/21/21 1425 10/22/21 0309 10/23/21 1001  10/25/21 0200 10/27/21 0627 10/27/21 1453 10/28/21 0832  NA 137 134* 134* 134* 134*  --  133*  K 4.5 4.9 4.5 4.1 3.8  --  4.0  CL 98 99 98 94* 96*  --  95*  CO2 24 20* 23 27 26   --  26  GLUCOSE 106* 79 143* 133* 112*  --  110*  BUN 49* 53* 62* 37* 21*  --  30*  CREATININE 9.29* 10.16* 11.99* 9.08* 6.47*  --  8.57*  CALCIUM 9.4 8.7* 8.8* 8.7* 9.5  --  9.8  PHOS  --   --  6.1*  --   --  4.4 5.3*   CBC Recent Labs  Lab 10/21/21 1425 10/23/21 1001 10/25/21 0200 10/27/21 0627 10/28/21 0430  WBC 11.3* 7.4 7.2 11.9* 11.5*  NEUTROABS 7.6  --   --   --  6.9  HGB 8.9* 7.0* 7.3* 7.6* 7.9*  HCT 29.1* 22.2* 23.9* 25.3* 25.6*  MCV 92.1 91.4 90.5 91.0 92.1  PLT 507* 354 356 359 356    Medications:     amLODipine  10 mg Oral Daily   atropine  1 drop Both Eyes  BID   buPROPion  150 mg Oral Daily   calcium acetate  2,001 mg Oral TID WC   Chlorhexidine Gluconate Cloth  6 each Topical Q0600   darbepoetin (ARANESP) injection - DIALYSIS  100 mcg Intravenous Q Mon-HD   dorzolamide-timolol  1 drop Both Eyes BID   feeding supplement (NEPRO CARB STEADY)  237 mL Oral BID BM   gabapentin  300 mg Oral q1800   heparin  5,000 Units Subcutaneous Q12H   hydrALAZINE  100 mg Oral Q8H   insulin aspart  0-6 Units Subcutaneous TID WC   labetalol  200 mg Oral BID   lamoTRIgine  25 mg Oral Daily   latanoprost  1 drop Both Eyes QHS   losartan  50 mg Oral Daily   multivitamin  1 tablet Oral QHS   pravastatin  80 mg Oral Daily   sodium chloride flush  3 mL Intravenous Q12H      Otelia Santee, MD 10/28/2021, 10:11 AM

## 2021-10-28 NOTE — Progress Notes (Signed)
Progress Note  Patient Name: Tammy Moses Date of Encounter: 10/28/2021  [facility] Cardiologist: New  Subjective   No chest pain or dyspnea this am.   Inpatient Medications    Scheduled Meds:  amLODipine  10 mg Oral Daily   atropine  1 drop Both Eyes BID   buPROPion  150 mg Oral Daily   calcium acetate  2,001 mg Oral TID WC   Chlorhexidine Gluconate Cloth  6 each Topical Q0600   darbepoetin (ARANESP) injection - DIALYSIS  100 mcg Intravenous Q Mon-HD   dorzolamide-timolol  1 drop Both Eyes BID   feeding supplement (NEPRO CARB STEADY)  237 mL Oral BID BM   gabapentin  300 mg Oral q1800   heparin  5,000 Units Subcutaneous Q12H   hydrALAZINE  100 mg Oral Q8H   insulin aspart  0-6 Units Subcutaneous TID WC   labetalol  200 mg Oral BID   lamoTRIgine  25 mg Oral Daily   latanoprost  1 drop Both Eyes QHS   losartan  50 mg Oral Daily   multivitamin  1 tablet Oral QHS   pravastatin  80 mg Oral Daily   sodium chloride flush  3 mL Intravenous Q12H   Continuous Infusions:  sodium chloride     PRN Meds: sodium chloride, acetaminophen, acetaminophen, albuterol, benzonatate, hydrALAZINE, HYDROcodone-acetaminophen, HYDROmorphone (DILAUDID) injection, labetalol, lidocaine (PF), lidocaine-prilocaine, metoCLOPramide, ondansetron (ZOFRAN) IV, pentafluoroprop-tetrafluoroeth, senna-docusate, sodium chloride flush   Vital Signs    Vitals:   10/27/21 2309 10/28/21 0435 10/28/21 0626 10/28/21 0826  BP: (!) 152/58 139/88  (!) 148/47  Pulse: 70 65  70  Resp: 15 13  12   Temp: 98.4 F (36.9 C) 97.9 F (36.6 C)  98 F (36.7 C)  TempSrc: Oral Oral  Oral  SpO2: 96% 98%  100%  Weight:   53.5 kg   Height:        Intake/Output Summary (Last 24 hours) at 10/28/2021 0850 Last data filed at 10/28/2021 0830 Gross per 24 hour  Intake --  Output 100 ml  Net -100 ml   Last 3 Weights 10/28/2021 10/27/2021 10/26/2021  Weight (lbs) 117 lb 15.1 oz 117 lb 15.1 oz 117 lb 15.1 oz  Weight (kg) 53.5 kg  53.5 kg 53.5 kg      Telemetry    sinus - Personally Reviewed  ECG    NSR, non-specific ST/T wave abn-unchanged - Personally Reviewed  Physical Exam   GEN: No acute distress.   Neck: No JVD Cardiac: RRR, soft systolic murmur Respiratory: Clear to auscultation bilaterally. GI: Soft, nontender, non-distended  MS: No edema; No deformity. Neuro:  Nonfocal  Psych: Normal affect   Labs    High Sensitivity Troponin:   Recent Labs  Lab 09/30/21 1200 09/30/21 1316 09/30/21 2118 09/30/21 2326  TROPONINIHS 65* 63* 63* 64*     Chemistry Recent Labs  Lab 10/23/21 1001 10/25/21 0200 10/27/21 0627  NA 134* 134* 134*  K 4.5 4.1 3.8  CL 98 94* 96*  CO2 23 27 26   GLUCOSE 143* 133* 112*  BUN 62* 37* 21*  CREATININE 11.99* 9.08* 6.47*  CALCIUM 8.8* 8.7* 9.5  PROT  --  6.9  --   ALBUMIN 2.8* 2.7*  --   AST  --  17  --   ALT  --  15  --   ALKPHOS  --  67  --   BILITOT  --  0.5  --   GFRNONAA 3* 5* 7*  ANIONGAP  13 13 12     Lipids No results for input(s): CHOL, TRIG, HDL, LABVLDL, LDLCALC, CHOLHDL in the last 168 hours.  Hematology Recent Labs  Lab 10/25/21 0200 10/27/21 0627 10/28/21 0430  WBC 7.2 11.9* 11.5*  RBC 2.64* 2.78* 2.78*  HGB 7.3* 7.6* 7.9*  HCT 23.9* 25.3* 25.6*  MCV 90.5 91.0 92.1  MCH 27.7 27.3 28.4  MCHC 30.5 30.0 30.9  RDW 16.2* 16.1* 16.4*  PLT 356 359 356   Thyroid No results for input(s): TSH, FREET4 in the last 168 hours.  BNPNo results for input(s): BNP, PROBNP in the last 168 hours.  DDimer No results for input(s): DDIMER in the last 168 hours.   Radiology    PERIPHERAL VASCULAR CATHETERIZATION  Result Date: 10/27/2021 Images from the original result were not included. Patient name: DAZIYA REDMOND MRN: [id number] DOB: 1966-05-25 Sex: female 10/21/2021 - 10/27/2021 Pre-operative Diagnosis: Right toe ulcer Post-operative diagnosis:  Same Surgeon:  Annamarie Major Procedure Performed:  1.  Ultrasound-guided access, left femoral artery  2.   Abdominal aortogram  3.  Bilateral lower extremity runoff  4.  Second-order catheterization  5.  Conscious sedation, 36 minutes Indications: This is a 56 year old female with end-stage renal disease who has dry gangrene to her right great toe.  She comes in today for further evaluation. Procedure:  The patient was identified in the holding area and taken to room 8.  The patient was then placed supine on the table and prepped and draped in the usual sterile fashion.  A time out was called.  Conscious sedation was administered with the use of IV fentanyl and Versed under continuous physician and nurse monitoring.  Heart rate, blood pressure, and oxygen saturation were continuously monitored.  Total sedation time was 36 minutes.  Ultrasound was used to evaluate the left common femoral artery.  It was patent .  A digital ultrasound image was acquired.  A micropuncture needle was used to access the left common femoral artery under ultrasound guidance.  An 018 wire was advanced without resistance and a micropuncture sheath was placed.  The 018 wire was removed and a benson wire was placed.  The micropuncture sheath was exchanged for a 5 french sheath.  An omniflush catheter was advanced over the wire to the level of L-1.  An abdominal angiogram was obtained.  Next, using the omniflush catheter and a benson wire, the aortic bifurcation was crossed and the catheter was placed into theright external iliac artery and right runoff was obtained.  left runoff was performed via retrograde sheath injections. Findings:  Aortogram: No significant renal artery stenosis was identified.  The infrarenal abdominal aorta is heavily calcified but widely patent.  The right common and external iliac artery heavily calcified but without significant stenosis.  Small caliber left common and external iliac arteries with moderate left common iliac stenosis.  Right Lower Extremity: Greater than 60% right common femoral artery calcific disease.   The profundofemoral artery is small in caliber.  It has significant disease distally.  The superficial femoral artery is also very small in caliber with multiple areas of high-grade lesion/occlusions.  Popliteal artery is a very small vessel.  There is single-vessel runoff via the peroneal artery which also has multiple areas of stenosis.  Left Lower Extremity: The left common femoral artery is nearly occluded.  A large profunda branch is visualized going down the leg.  The sheath appears to be occlusive and sewed the remaining portion of the leg was not visualized. Intervention:  none Impression:  #1  Severe infrainguinal occlusive disease bilaterally  #2  On the right, there is greater than 60-70% common femoral stenosis.  In addition the superficial femoral artery is extremely small in caliber with multiple high-grade areas of stenosis.  Popliteal artery is very small in caliber and there is single-vessel runoff via a diseased peroneal artery  #3  The patient does not have good options for revascularization.  I think her best approach is going to be a right femoral endarterectomy in the operating room and simultaneous percutaneous intervention onto the peroneal artery as well as the superficial femoral-popliteal artery.  This has been scheduled for Friday. Theotis Burrow, M.D., FACS [facility] Office: [phone] Pager:  [phone]    Cardiac Studies   Echo pending  Patient Profile     56 y.o. female with history of ESRD on HD, HTN, anemia, COPD, blindness, tobacco abuse and PAD admitted after a fall and found to have severe PAD with limb ischemia, right toe gangrene. She has been followed by [name] with [facility] and underwent PV angiogram 10/27/21. Severe right common femoral artery stenosis and severe disease from the knee down. Planning for right common femoral endarterectomy later this week. Cardiology consulted for pre-op risk assessment.   Assessment & Plan     Pre-operative cardiovascular examination: EKG is unchanged. She has no chest pain or symptoms suggestive of angina/CHF. Echo is pending today. We will follow the results of the echo when it is completed.   For questions or updates, please contact [place] Please consult [email or web address] for contact info under        Signed, Lauree Chandler, MD  10/28/2021, 8:50 AM

## 2021-10-28 NOTE — Progress Notes (Signed)
OT Cancellation Note  Patient Details Name: Tammy Moses MRN: [id number] DOB: 11-14-1965   Cancelled Treatment:    Reason Eval/Treat Not Completed: Pt at test/procedure. Pt in HD, will follow up as schedule allows.  Lynnda Child, OTD, OTR/L Acute Rehab [phone]    Kaylyn Lim 10/28/2021, 10:43 AM

## 2021-10-29 ENCOUNTER — Inpatient Hospital Stay (HOSPITAL_COMMUNITY): Payer: Medicare Other

## 2021-10-29 ENCOUNTER — Encounter (HOSPITAL_COMMUNITY): Payer: Self-pay | Admitting: Family Medicine

## 2021-10-29 ENCOUNTER — Other Ambulatory Visit: Payer: Self-pay

## 2021-10-29 DIAGNOSIS — D638 Anemia in other chronic diseases classified elsewhere: Secondary | ICD-10-CM

## 2021-10-29 DIAGNOSIS — I96 Gangrene, not elsewhere classified: Secondary | ICD-10-CM | POA: Diagnosis not present

## 2021-10-29 DIAGNOSIS — R230 Cyanosis: Secondary | ICD-10-CM

## 2021-10-29 DIAGNOSIS — Z992 Dependence on renal dialysis: Secondary | ICD-10-CM

## 2021-10-29 DIAGNOSIS — Z01810 Encounter for preprocedural cardiovascular examination: Secondary | ICD-10-CM | POA: Diagnosis not present

## 2021-10-29 DIAGNOSIS — N186 End stage renal disease: Secondary | ICD-10-CM

## 2021-10-29 LAB — CBC WITH DIFFERENTIAL/PLATELET
Abs Immature Granulocytes: 0.07 10*3/uL (ref 0.00–0.07)
Basophils Absolute: 0.1 10*3/uL (ref 0.0–0.1)
Basophils Relative: 1 %
Eosinophils Absolute: 0.4 10*3/uL (ref 0.0–0.5)
Eosinophils Relative: 3 %
HCT: 27.3 % — ABNORMAL LOW (ref 36.0–46.0)
Hemoglobin: 8.3 g/dL — ABNORMAL LOW (ref 12.0–15.0)
Immature Granulocytes: 1 %
Lymphocytes Relative: 20 %
Lymphs Abs: 2.5 10*3/uL (ref 0.7–4.0)
MCH: 28.2 pg (ref 26.0–34.0)
MCHC: 30.4 g/dL (ref 30.0–36.0)
MCV: 92.9 fL (ref 80.0–100.0)
Monocytes Absolute: 1.7 10*3/uL — ABNORMAL HIGH (ref 0.1–1.0)
Monocytes Relative: 13 %
Neutro Abs: 8 10*3/uL — ABNORMAL HIGH (ref 1.7–7.7)
Neutrophils Relative %: 62 %
Platelets: 362 10*3/uL (ref 150–400)
RBC: 2.94 MIL/uL — ABNORMAL LOW (ref 3.87–5.11)
RDW: 16.6 % — ABNORMAL HIGH (ref 11.5–15.5)
WBC: 12.7 10*3/uL — ABNORMAL HIGH (ref 4.0–10.5)
nRBC: 0.3 % — ABNORMAL HIGH (ref 0.0–0.2)

## 2021-10-29 LAB — BASIC METABOLIC PANEL
Anion gap: 8 (ref 5–15)
BUN: 18 mg/dL (ref 6–20)
CO2: 26 mmol/L (ref 22–32)
Calcium: 9.5 mg/dL (ref 8.9–10.3)
Chloride: 98 mmol/L (ref 98–111)
Creatinine, Ser: 6.35 mg/dL — ABNORMAL HIGH (ref 0.44–1.00)
GFR, Estimated: 7 mL/min — ABNORMAL LOW (ref >60)
Glucose, Bld: 111 mg/dL — ABNORMAL HIGH (ref 70–99)
Potassium: 4 mmol/L (ref 3.5–5.1)
Sodium: 132 mmol/L — ABNORMAL LOW (ref 135–145)

## 2021-10-29 LAB — GLUCOSE, CAPILLARY
Glucose-Capillary: 115 mg/dL — ABNORMAL HIGH (ref 70–99)
Glucose-Capillary: 142 mg/dL — ABNORMAL HIGH (ref 70–99)
Glucose-Capillary: 102 mg/dL — ABNORMAL HIGH (ref 70–99)
Glucose-Capillary: 130 mg/dL — ABNORMAL HIGH (ref 70–99)

## 2021-10-29 MED ORDER — LOSARTAN POTASSIUM 50 MG PO TABS
75.0000 mg | ORAL_TABLET | Freq: Every day | ORAL | Status: DC
  Administered 2021-10-30 – 2021-11-05 (×5): 75 mg via ORAL
  Filled 2021-10-29 (×5): qty 1

## 2021-10-29 MED ORDER — CEFAZOLIN SODIUM-DEXTROSE 1-4 GM/50ML-% IV SOLN
1.0000 g | INTRAVENOUS | Status: AC
  Administered 2021-10-30: 1 g via INTRAVENOUS
  Filled 2021-10-29: qty 50

## 2021-10-29 NOTE — Progress Notes (Addendum)
Progress Note  Patient Name: Tammy Moses Date of Encounter: 10/29/2021  [facility] Cardiologist: New  Subjective   No chest pain or dyspnea  Inpatient Medications    Scheduled Meds:  amLODipine  10 mg Oral Daily   atropine  1 drop Both Eyes BID   buPROPion  150 mg Oral Daily   calcium acetate  2,001 mg Oral TID WC   Chlorhexidine Gluconate Cloth  6 each Topical Q0600   darbepoetin (ARANESP) injection - DIALYSIS  100 mcg Intravenous Q Mon-HD   dorzolamide-timolol  1 drop Both Eyes BID   feeding supplement (NEPRO CARB STEADY)  237 mL Oral BID BM   gabapentin  300 mg Oral q1800   heparin  5,000 Units Subcutaneous Q12H   hydrALAZINE  100 mg Oral Q8H   insulin aspart  0-6 Units Subcutaneous TID WC   labetalol  200 mg Oral BID   lamoTRIgine  25 mg Oral Daily   latanoprost  1 drop Both Eyes QHS   losartan  50 mg Oral Daily   multivitamin  1 tablet Oral QHS   pravastatin  80 mg Oral Daily   sodium chloride flush  3 mL Intravenous Q12H   Continuous Infusions:  sodium chloride     PRN Meds: sodium chloride, acetaminophen, acetaminophen, albuterol, benzonatate, hydrALAZINE, HYDROcodone-acetaminophen, HYDROmorphone (DILAUDID) injection, labetalol, metoCLOPramide, ondansetron (ZOFRAN) IV, senna-docusate, sodium chloride flush   Vital Signs    Vitals:   10/28/21 1921 10/28/21 2331 10/29/21 0327 10/29/21 0700  BP: (!) 137/38 (!) 163/54 108/60   Pulse: 71 74 71   Resp: 18 16 16    Temp: 98.1 F (36.7 C) 98.9 F (37.2 C) (!) 97.4 F (36.3 C)   TempSrc: Oral Oral Oral   SpO2: 100% 99% 90%   Weight:    53.3 kg  Height:        Intake/Output Summary (Last 24 hours) at 10/29/2021 0747 Last data filed at 10/28/2021 2100 Gross per 24 hour  Intake 480 ml  Output 2700 ml  Net -2220 ml   Last 3 Weights 10/29/2021 10/28/2021 10/28/2021  Weight (lbs) 117 lb 8.1 oz 117 lb 11.6 oz 123 lb 3.8 oz  Weight (kg) 53.3 kg 53.4 kg 55.9 kg      Telemetry    NSR - Personally  Reviewed  ECG    No am ekg- Personally Reviewed  Physical Exam    General: Well developed, well nourished, NAD  HEENT: OP clear, mucus membranes moist  Neuro: No focal deficits  Musculoskeletal: Muscle strength 5/5 all ext  Psychiatric: Mood and affect normal  Neck: No JVD Lungs:Clear bilaterally, no wheezes, rhonci, crackles Cardiovascular: Regular rate and rhythm. Systolic murmur.  Abdomen:Soft. Extremities: No lower extremity edema.    Labs    High Sensitivity Troponin:   Recent Labs  Lab 09/30/21 1200 09/30/21 1316 09/30/21 2118 09/30/21 2326  TROPONINIHS 65* 63* 63* 64*     Chemistry Recent Labs  Lab 10/23/21 1001 10/25/21 0200 10/27/21 0627 10/28/21 0832 10/29/21 0252  NA 134* 134* 134* 133* 132*  K 4.5 4.1 3.8 4.0 4.0  CL 98 94* 96* 95* 98  CO2 23 27 26 26 26   GLUCOSE 143* 133* 112* 110* 111*  BUN 62* 37* 21* 30* 18  CREATININE 11.99* 9.08* 6.47* 8.57* 6.35*  CALCIUM 8.8* 8.7* 9.5 9.8 9.5  PROT  --  6.9  --   --   --   ALBUMIN 2.8* 2.7*  --  3.0*  --  AST  --  17  --   --   --   ALT  --  15  --   --   --   ALKPHOS  --  67  --   --   --   BILITOT  --  0.5  --   --   --   GFRNONAA 3* 5* 7* 5* 7*  ANIONGAP 13 13 12 12 8     Lipids No results for input(s): CHOL, TRIG, HDL, LABVLDL, LDLCALC, CHOLHDL in the last 168 hours.  Hematology Recent Labs  Lab 10/27/21 0627 10/28/21 0430 10/29/21 0252  WBC 11.9* 11.5* 12.7*  RBC 2.78* 2.78* 2.94*  HGB 7.6* 7.9* 8.3*  HCT 25.3* 25.6* 27.3*  MCV 91.0 92.1 92.9  MCH 27.3 28.4 28.2  MCHC 30.0 30.9 30.4  RDW 16.1* 16.4* 16.6*  PLT 359 356 362   Thyroid No results for input(s): TSH, FREET4 in the last 168 hours.  BNPNo results for input(s): BNP, PROBNP in the last 168 hours.  DDimer No results for input(s): DDIMER in the last 168 hours.   Radiology    PERIPHERAL VASCULAR CATHETERIZATION  Result Date: 10/27/2021 Images from the original result were not included. Patient name: Tammy Moses MRN:  [id number] DOB: 1966/08/13 Sex: female 10/21/2021 - 10/27/2021 Pre-operative Diagnosis: Right toe ulcer Post-operative diagnosis:  Same Surgeon:  Annamarie Major Procedure Performed:  1.  Ultrasound-guided access, left femoral artery  2.  Abdominal aortogram  3.  Bilateral lower extremity runoff  4.  Second-order catheterization  5.  Conscious sedation, 36 minutes Indications: This is a 56 year old female with end-stage renal disease who has dry gangrene to her right great toe.  She comes in today for further evaluation. Procedure:  The patient was identified in the holding area and taken to room 8.  The patient was then placed supine on the table and prepped and draped in the usual sterile fashion.  A time out was called.  Conscious sedation was administered with the use of IV fentanyl and Versed under continuous physician and nurse monitoring.  Heart rate, blood pressure, and oxygen saturation were continuously monitored.  Total sedation time was 36 minutes.  Ultrasound was used to evaluate the left common femoral artery.  It was patent .  A digital ultrasound image was acquired.  A micropuncture needle was used to access the left common femoral artery under ultrasound guidance.  An 018 wire was advanced without resistance and a micropuncture sheath was placed.  The 018 wire was removed and a benson wire was placed.  The micropuncture sheath was exchanged for a 5 french sheath.  An omniflush catheter was advanced over the wire to the level of L-1.  An abdominal angiogram was obtained.  Next, using the omniflush catheter and a benson wire, the aortic bifurcation was crossed and the catheter was placed into theright external iliac artery and right runoff was obtained.  left runoff was performed via retrograde sheath injections. Findings:  Aortogram: No significant renal artery stenosis was identified.  The infrarenal abdominal aorta is heavily calcified but widely patent.  The right common and external iliac artery  heavily calcified but without significant stenosis.  Small caliber left common and external iliac arteries with moderate left common iliac stenosis.  Right Lower Extremity: Greater than 60% right common femoral artery calcific disease.  The profundofemoral artery is small in caliber.  It has significant disease distally.  The superficial femoral artery is also very small in caliber with multiple  areas of high-grade lesion/occlusions.  Popliteal artery is a very small vessel.  There is single-vessel runoff via the peroneal artery which also has multiple areas of stenosis.  Left Lower Extremity: The left common femoral artery is nearly occluded.  A large profunda branch is visualized going down the leg.  The sheath appears to be occlusive and sewed the remaining portion of the leg was not visualized. Intervention:  none Impression:  #1  Severe infrainguinal occlusive disease bilaterally  #2  On the right, there is greater than 60-70% common femoral stenosis.  In addition the superficial femoral artery is extremely small in caliber with multiple high-grade areas of stenosis.  Popliteal artery is very small in caliber and there is single-vessel runoff via a diseased peroneal artery  #3  The patient does not have good options for revascularization.  I think her best approach is going to be a right femoral endarterectomy in the operating room and simultaneous percutaneous intervention onto the peroneal artery as well as the superficial femoral-popliteal artery.  This has been scheduled for Friday. Theotis Burrow, M.D., FACS [facility] [place] Office: [phone] Pager:  [phone]   ECHOCARDIOGRAM COMPLETE  Result Date: 10/28/2021    ECHOCARDIOGRAM REPORT   Patient Name:   Tammy Moses Date of Exam: 10/28/2021 Medical Rec #:  [id number]      Height:       61.0 in Accession #:    [id number]     Weight:       117.7 lb Date of Birth:  Oct 20, 1966      BSA:          1.508 m Patient Age:    56  years       BP:           139/88 mmHg Patient Gender: F              HR:           72 bpm. Exam Location:  Inpatient Procedure: 2D Echo, Color Doppler and Cardiac Doppler Indications:    Murmur  History:        Patient has prior history of Echocardiogram examinations. COPD;                 Risk Factors:Diabetes and Hypertension.  Sonographer:    Jyl Heinz Referring Phys: [place]  1. Left ventricular ejection fraction, by estimation, is 60 to 65%. The left ventricle has normal function. The left ventricle has no regional wall motion abnormalities. The left ventricular internal cavity size was mildly dilated. There is mild left ventricular hypertrophy. Left ventricular diastolic parameters are consistent with Grade II diastolic dysfunction (pseudonormalization). Elevated left atrial pressure.  2. Right ventricular systolic function is normal. The right ventricular size is normal. There is normal pulmonary artery systolic pressure. The estimated right ventricular systolic pressure is 43.3 mmHg.  3. Right atrial size was mildly dilated.  4. Left atrial size was mildly dilated.  5. The mitral valve is degenerative. Mild mitral valve regurgitation. Moderate mitral stenosis. The mean mitral valve gradient is 6.0 mmHg at 73bpm. MVA 1.4cm^2 by continuity equation. Moderate mitral annular calcification.  6. Tricuspid valve regurgitation is mild to moderate.  7. The aortic valve is tricuspid. There is moderate calcification of the aortic valve. Aortic valve regurgitation is not visualized. Mild aortic valve stenosis.  8. The inferior vena cava is normal in size with greater than 50% respiratory variability, suggesting right atrial pressure  of 3 mmHg. FINDINGS  Left Ventricle: Left ventricular ejection fraction, by estimation, is 60 to 65%. The left ventricle has normal function. The left ventricle has no regional wall motion abnormalities. The left ventricular internal cavity size was mildly  dilated. There is  mild left ventricular hypertrophy. Left ventricular diastolic parameters are consistent with Grade II diastolic dysfunction (pseudonormalization). Elevated left atrial pressure. Right Ventricle: The right ventricular size is normal. No increase in right ventricular wall thickness. Right ventricular systolic function is normal. There is normal pulmonary artery systolic pressure. The tricuspid regurgitant velocity is 2.65 m/s, and  with an assumed right atrial pressure of 3 mmHg, the estimated right ventricular systolic pressure is 99.3 mmHg. Left Atrium: Left atrial size was mildly dilated. Right Atrium: Right atrial size was mildly dilated. Pericardium: There is no evidence of pericardial effusion. Mitral Valve: The mitral valve is degenerative in appearance. Moderate mitral annular calcification. Mild mitral valve regurgitation. Moderate mitral valve stenosis. MV peak gradient, 13.0 mmHg. The mean mitral valve gradient is 6.0 mmHg. Tricuspid Valve: The tricuspid valve is normal in structure. Tricuspid valve regurgitation is mild to moderate. Aortic Valve: The aortic valve is tricuspid. There is moderate calcification of the aortic valve. Aortic valve regurgitation is not visualized. Mild aortic stenosis is present. Aortic valve peak gradient measures 15.1 mmHg. Pulmonic Valve: The pulmonic valve was normal in structure. Pulmonic valve regurgitation is trivial. Aorta: The aortic root and ascending aorta are structurally normal, with no evidence of dilitation. Venous: The inferior vena cava is normal in size with greater than 50% respiratory variability, suggesting right atrial pressure of 3 mmHg. IAS/Shunts: No atrial level shunt detected by color flow Doppler.  LEFT VENTRICLE PLAX 2D LVIDd:         5.70 cm      Diastology LVIDs:         3.60 cm      LV e' medial:    5.66 cm/s LV PW:         1.20 cm      LV E/e' medial:  29.7 LV IVS:        1.20 cm      LV e' lateral:   5.77 cm/s LVOT diam:      1.80 cm      LV E/e' lateral: 29.1 LV SV:         68 LV SV Index:   45 LVOT Area:     2.54 cm  LV Volumes (MOD) LV vol d, MOD A2C: 131.0 ml LV vol d, MOD A4C: 118.0 ml LV vol s, MOD A2C: 54.4 ml LV vol s, MOD A4C: 48.5 ml LV SV MOD A2C:     76.6 ml LV SV MOD A4C:     118.0 ml LV SV MOD BP:      72.2 ml RIGHT VENTRICLE             IVC RV Basal diam:  3.80 cm     IVC diam: 1.40 cm RV Mid diam:    3.30 cm RV S prime:     10.90 cm/s TAPSE (M-mode): 3.0 cm LEFT ATRIUM             Index        RIGHT ATRIUM           Index LA diam:        3.50 cm 2.32 cm/m   RA Area:     18.60 cm LA Vol (A2C):   47.4 ml  31.44 ml/m  RA Volume:   52.10 ml  34.55 ml/m LA Vol (A4C):   58.9 ml 39.06 ml/m LA Biplane Vol: 54.5 ml 36.14 ml/m  AORTIC VALVE AV Area (Vmax): 1.56 cm AV Vmax:        194.00 cm/s AV Peak Grad:   15.1 mmHg LVOT Vmax:      119.00 cm/s LVOT Vmean:     89.900 cm/s LVOT VTI:       0.268 m  AORTA Ao Root diam: 2.50 cm Ao Asc diam:  2.80 cm MITRAL VALVE                TRICUSPID VALVE MV Area (PHT): 3.68 cm     TR Peak grad:   28.1 mmHg MV Area VTI:   1.47 cm     TR Vmax:        265.00 cm/s MV Peak grad:  13.0 mmHg MV Mean grad:  6.0 mmHg     SHUNTS MV Vmax:       1.80 m/s     Systemic VTI:  0.27 m MV Vmean:      118.0 cm/s   Systemic Diam: 1.80 cm MV Decel Time: 206 msec MV E velocity: 168.00 cm/s MV A velocity: 132.00 cm/s MV E/A ratio:  1.27 Oswaldo Milian MD Electronically signed by Oswaldo Milian MD Signature Date/Time: 10/28/2021/5:07:15 PM    Final     Cardiac Studies   Echo pending  Patient Profile     56 y.o. female with history of ESRD on HD, HTN, anemia, COPD, blindness, tobacco abuse and PAD admitted after a fall and found to have severe PAD with limb ischemia, right toe gangrene. She has been followed by [name] with [facility] and underwent PV angiogram 10/27/21. Severe right common femoral artery stenosis and severe disease from the knee down. Planning for right common femoral endarterectomy  later this week. Cardiology consulted for pre-op risk assessment.   Assessment & Plan    Pre-operative cardiovascular examination: EKG 1/323 is unchanged. No symptoms suggestive of angina. Echo with normal LV systolic function. She does have mild to moderate mitral valve stenosis and mild aortic stenosis. She can proceed with her planned surgical procedure tomorrow without further cardiac testing.   Cardiology will sign off. Please call with questions.   For questions or updates, please contact [place] Please consult [email or web address] for contact info under        Signed, Lauree Chandler, MD  10/29/2021, 7:47 AM

## 2021-10-29 NOTE — Progress Notes (Signed)
Physical Therapy Treatment Patient Details Name: Tammy Moses MRN: [id number] DOB: [birth year] Today's Date: 10/29/2021   History of Present Illness Pt is a 56 y/o female presenting to the ED on 12/28 secondary to fall. Found to have R patellar fx. Pt also with gangrene of R great toe, plan for right femoral endarterectomy and percutaneous intervention 1/6. PMH includes ESRD on HD, HTN, DM, COPD, blindness, tobacco abuse.    PT Comments    The pt was able to demo good progress with continued gait training this session. She completed x2 bouts of ambulation in the room with hop-to gait pattern to maintain NWB RLE. She did require seated rest after about 14 ft, but was then able to continue with mobility. She reports she is currently most limited by fatigue in her shoulders from use of RW, was educated in ROM and strengthening exercises to perform outside of therapy to maintain mobility and strength in BUE. Will continue to benefit from skilled PT and progression of mobility and independence with transfers to decrease pt dependence on caregiver assist. Continue to recommend SNF for rehab at d/c at this time, will continue to assess following surgical intervention planned for tomorrow.    Recommendations for follow up therapy are one component of a multi-disciplinary discharge planning process, led by the attending physician.  Recommendations may be updated based on patient status, additional functional criteria and insurance authorization.  Follow Up Recommendations  Skilled nursing-short term rehab (<3 hours/day)     Assistance Recommended at Discharge Frequent or constant Supervision/Assistance  Patient can return home with the following A little help with walking and/or transfers;A little help with bathing/dressing/bathroom;Assistance with cooking/housework;Assist for transportation   Equipment Recommendations  Rolling walker (2 wheels);Wheelchair (measurements PT)    Recommendations for  Other Services       Precautions / Restrictions Precautions Precautions: Fall;Other (comment) Precaution Comments: blind, R foot drop Required Braces or Orthoses: Knee Immobilizer - Right Knee Immobilizer - Right: On at all times Restrictions Weight Bearing Restrictions: Yes RLE Weight Bearing: Non weight bearing     Mobility  Bed Mobility Overal bed mobility: Needs Assistance Bed Mobility: Supine to Sit;Sit to Supine     Supine to sit: Supervision Sit to supine: Supervision   General bed mobility comments: increased time, no assist    Transfers Overall transfer level: Needs assistance Equipment used: Rolling walker (2 wheels) Transfers: Sit to/from Stand Sit to Stand: Min assist           General transfer comment: minA to power up and steady, pt able to maintain NWB RLE well without much assist or cues    Ambulation/Gait Ambulation/Gait assistance: Min assist Gait Distance (Feet): 14 Feet (+ 8 ft) Assistive device: Rolling walker (2 wheels) Gait Pattern/deviations: Step-to pattern Gait velocity: Decreased Gait velocity interpretation: <1.31 ft/sec, indicative of household ambulator   General Gait Details: Hop to gait pattern with cues and minA for RW management, 1 seated rest break, keeping weight off RLE mostly, resting on floor at times. Fatigues in UEs/ shoulders      Balance Overall balance assessment: Needs assistance Sitting-balance support: Feet supported;No upper extremity supported Sitting balance-Leahy Scale: Good     Standing balance support: During functional activity;Reliant on assistive device for balance Standing balance-Leahy Scale: Poor Standing balance comment: Reliant on BUE support                            Cognition Arousal/Alertness: Awake/alert  Behavior During Therapy: WFL for tasks assessed/performed Overall Cognitive Status: No family/caregiver present to determine baseline cognitive functioning                                  General Comments: pt able to follow all cues and instructions        Exercises General Exercises - Upper Extremity Shoulder Flexion: AROM;Both;10 reps;Seated Shoulder ABduction: AROM;Both;10 reps;Seated Other Exercises Other Exercises: overhead press x 10 BUE    General Comments General comments (skin integrity, edema, etc.): KI on through session, VSS      Pertinent Vitals/Pain Faces Pain Scale: Hurts even more Pain Location: to pf R foot Pain Descriptors / Indicators: Grimacing;Sharp;Pins and needles Pain Intervention(s): Limited activity within patient's tolerance;Monitored during session;Repositioned     PT Goals (current goals can now be found in the care plan section) Acute Rehab PT Goals Patient Stated Goal: to go home PT Goal Formulation: With patient Time For Goal Achievement: 11/04/21 Potential to Achieve Goals: Fair Progress towards PT goals: Progressing toward goals    Frequency    Min 3X/week      PT Plan Current plan remains appropriate       AM-PAC PT "6 Clicks" Mobility   Outcome Measure  Help needed turning from your back to your side while in a flat bed without using bedrails?: A Little Help needed moving from lying on your back to sitting on the side of a flat bed without using bedrails?: A Little Help needed moving to and from a bed to a chair (including a wheelchair)?: A Little Help needed standing up from a chair using your arms (e.g., wheelchair or bedside chair)?: A Little Help needed to walk in hospital room?: A Little Help needed climbing 3-5 steps with a railing? : Total 6 Click Score: 16    End of Session Equipment Utilized During Treatment: Gait belt Activity Tolerance: Patient tolerated treatment well Patient left: in bed;with call bell/phone within reach;with bed alarm set Nurse Communication: Mobility status PT Visit Diagnosis: Unsteadiness on feet (R26.81);Muscle weakness (generalized)  (M62.81);History of falling (Z91.81);Repeated falls (R29.6);Difficulty in walking, not elsewhere classified (R26.2)     Time: 0539-7673 PT Time Calculation (min) (ACUTE ONLY): 31 min  Charges:  $Therapeutic Exercise: 23-37 mins                     West Carbo, PT, DPT   Acute Rehabilitation Department Pager #: [phone]   Sandra Cockayne 10/29/2021, 11:34 AM

## 2021-10-29 NOTE — H&P (View-Only) (Signed)
°  Progress Note    10/29/2021 9:00 AM Hospital Day 8  Subjective:  sleeping and awakes to voice  afebrile  Vitals:   10/28/21 2331 10/29/21 0327  BP: (!) 163/54 108/60  Pulse: 74 71  Resp: 16 16  Temp: 98.9 F (37.2 C) (!) 97.4 F (36.3 C)  SpO2: 99% 90%    Physical Exam: General:  no distress; resting comfortably  CBC    Component Value Date/Time   WBC 12.7 (H) 10/29/2021 0252   RBC 2.94 (L) 10/29/2021 0252   HGB 8.3 (L) 10/29/2021 0252   HGB 12.4 12/13/2017 1416   HCT 27.3 (L) 10/29/2021 0252   HCT 37.8 12/13/2017 1416   PLT 362 10/29/2021 0252   PLT 254 12/13/2017 1416   MCV 92.9 10/29/2021 0252   MCV 83 12/13/2017 1416   MCH 28.2 10/29/2021 0252   MCHC 30.4 10/29/2021 0252   RDW 16.6 (H) 10/29/2021 0252   RDW 15.8 (H) 12/13/2017 1416   LYMPHSABS 2.5 10/29/2021 0252   LYMPHSABS 4.1 (H) 12/13/2017 1416   MONOABS 1.7 (H) 10/29/2021 0252   EOSABS 0.4 10/29/2021 0252   EOSABS 0.3 12/13/2017 1416   BASOSABS 0.1 10/29/2021 0252   BASOSABS 0.1 12/13/2017 1416    BMET    Component Value Date/Time   NA 132 (L) 10/29/2021 0252   NA 146 (H) 05/10/2017 0950   K 4.0 10/29/2021 0252   CL 98 10/29/2021 0252   CO2 26 10/29/2021 0252   GLUCOSE 111 (H) 10/29/2021 0252   BUN 18 10/29/2021 0252   BUN 38 (H) 05/10/2017 0950   CREATININE 6.35 (H) 10/29/2021 0252   CREATININE 3.40 (H) 03/11/2017 1505   CALCIUM 9.5 10/29/2021 0252   GFRNONAA 7 (L) 10/29/2021 0252   GFRNONAA 15 (L) 03/11/2017 1505   GFRAA 5 (L) 05/05/2020 1314   GFRAA 17 (L) 03/11/2017 1505    INR    Component Value Date/Time   INR 1.2 10/21/2021 1425     Intake/Output Summary (Last 24 hours) at 10/29/2021 0900 Last data filed at 10/28/2021 2100 Gross per 24 hour  Intake 480 ml  Output 2600 ml  Net -2120 ml     Assessment/Plan:  56 y.o. female with PAD Hospital Day 8  -Pt for surgery tomorrow.  [name] feels a right femoral endarterectomy in the operating room and simultaneous  percutaneous intervention onto the peroneal artery as well as the superficial femoral-popliteal artery.  -npo after MN/labs/consent ordered   Leontine Locket, PA-C [facility] [phone] 10/29/2021 9:00 AM  I agree with the above.  She has been evaluated by cardiology.  She has a normal echo.  She has been cleared for surgery with obvious risks.  I will plan on right femoral endarterectomy and angioplasty of her superficial femoral, popliteal, and tibial vessels in addition to right great toe amputation tomorrow.  I did discuss with the patient that she has poor blood flow to her foot and she may not be able to heal a toe amputation and could ultimately end up with a below-knee amputation.  She will be n.p.o. after midnight.  Annamarie Major

## 2021-10-29 NOTE — Progress Notes (Signed)
PROGRESS NOTE  Tammy Moses RDE:[id number] DOB: March 29, 1966 DOA: 10/21/2021 PCP: [name], FNP  Brief History   Patient is a 56 year old female with history of ESRD on dialysis MWF, hypertension, normocytic anemia, COPD, blindness, tobacco use who presented with after falling down on her right knee when she slipped on ice.  She was unable to stand up and complained of pain and swelling of right knee and was brought to the emergency department.  Imaging showed nondisplaced right patella fracture on x-ray.  Orthopedics was consulted and recommended right knee brace with PT and follow-up as an outpatient.  Patient was also found to have right toe gangrene, ABI showed severe peripheral artery disease.  Vascular surgery following, underwent  left lower extremity arteriography on 1/3 with finding of severe infrainguinal occlusive disease bilaterally.  She has been planned for right femoral endarterectomy and percutaneous intervention  on Friday.  PT/OT recommending SNF on DC,TOC consulted.  Consultants  Vascular surgery Cardiology  Procedures    Antibiotics   Anti-infectives (From admission, onward)    Start     Dose/Rate Route Frequency Ordered Stop   10/30/21 0600  ceFAZolin (ANCEF) IVPB 1 g/50 mL premix       Note to Pharmacy: Send with pt to OR   1 g 100 mL/hr over 30 Minutes Intravenous To Short Stay 10/29/21 0903 10/31/21 0600        Subjective  The patient is sitting up in bed eating breakfast. She expresses concerns about possibility of amputation if vascular supply to right lower extremity cannot be restored, although she understands the necessity.  Objective   Vitals:  Vitals:   10/29/21 0858 10/29/21 1201  BP: (!) 141/49 (!) 159/50  Pulse: 71 68  Resp: 18 18  Temp: 98.4 F (36.9 C) 98.4 F (36.9 C)  SpO2: 100% 99%    Exam:  Exam:  Constitutional:  The patient is awake, alert, and oriented x 3. No acute distress. Respiratory:  No increased work of  breathing. No wheezes, rales, or rhonchi No tactile fremitus Cardiovascular:  Regular rate and rhythm No murmurs, ectopy, or gallups. No lateral PMI. No thrills. Abdomen:  Abdomen is soft, non-tender, non-distended No hernias, masses, or organomegaly Normoactive bowel sounds.  Musculoskeletal:  No cyanosis, clubbing, or edema Skin:  No rashes, lesions, ulcers palpation of skin: no induration or nodules Neurologic:  CN 2-12 intact Sensation all 4 extremities intact Psychiatric:  Mental status Mood, affect appropriate Orientation to person, place, time  judgment and insight appear intact   I have personally reviewed the following:   Today's Data  Vitals  Lab Data  CBC, BMP  Imaging    Cardiology Data  Echocardiogram  Other Data    Scheduled Meds:  amLODipine  10 mg Oral Daily   atropine  1 drop Both Eyes BID   buPROPion  150 mg Oral Daily   calcium acetate  2,001 mg Oral TID WC   Chlorhexidine Gluconate Cloth  6 each Topical Q0600   darbepoetin (ARANESP) injection - DIALYSIS  100 mcg Intravenous Q Mon-HD   dorzolamide-timolol  1 drop Both Eyes BID   feeding supplement (NEPRO CARB STEADY)  237 mL Oral BID BM   gabapentin  300 mg Oral q1800   heparin  5,000 Units Subcutaneous Q12H   hydrALAZINE  100 mg Oral Q8H   insulin aspart  0-6 Units Subcutaneous TID WC   labetalol  200 mg Oral BID   lamoTRIgine  25 mg Oral Daily  latanoprost  1 drop Both Eyes QHS   losartan  50 mg Oral Daily   multivitamin  1 tablet Oral QHS   pravastatin  80 mg Oral Daily   sodium chloride flush  3 mL Intravenous Q12H   Continuous Infusions:  sodium chloride     [START ON 10/30/2021]  ceFAZolin (ANCEF) IV      Principal Problem:   Closed patellar sleeve fracture of right knee Active Problems:   Patellar sleeve fracture of right knee, closed, initial encounter   Gangrene of foot (HCC)   LOS: 6 days    A & P  Right knee patella nondisplaced fracture: Slipped on ice and  fell on right knee.  Orthopedics consulted, recommended knee immobilizer, follow-up as an outpatient with orthopedics.  Currently on knee mobilizer.  PT/OT consulted recommended skilled NF on discharge.   Right big toe gangrene: ABI showed severe peripheral artery disease.  CT angiogram showed severe atherosclerotic disease throughout the abdomen, pelvis and bilateral lower extremities,severe bilateral outflow disease .Vascular surgery following and underwent  left lower extremity arteriography on 1/3 with finding of severe infrainguinal occlusive disease bilaterally.  She has been planned for right femoral endarterectomy and percutaneous intervention  on Friday. Cardiology consulted for clearance for surgery. Echocardiogram was performed on 10/28/2020.  It has demonstrated EF of 60-65%, Normal LV function, and no regional wall motion abnormalities. LV cavity is mildly dilated. There is grade II diastolic dysfunction.   ESRD on dialysis: Dialyzed on Monday, Wednesday, Friday. nephrology following   Diabetes type 2: Currently on sliding insulin scale.  Monitor blood sugars. FSBS 96-142 in the last 24 hours.   Anemia of chronic disease: Hemoglobin is stable at 8.3 today. Iron panel showed severe iron deficiency, given IV iron.  Also aranesp.  Monitor CBC.  Will transfuse if hemoglobin drops less than 7.   Hypertension: Monitor blood pressure.  Continue amlodipine, hydralazine, labetalol, losartan. Blood pressures still a little high. Wiillincrease losartan to 75 mg daily.   Debility/deconditioning: PT/OT recommending skilled nursing facility on discharge.  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  Social worker following.   I have seen and examined this patient myself. I have spent 32 minutes in her evaluation and care.   DVT prophylaxis:Hep Giles Code Status: Full Family Communication:: None available Patient status:Inpatient   Dispo: The patient is from: Home              Anticipated d/c is to:SNF                Anticipated d/c date is: Awaiting vascular surgery procedure  Zakiyyah Savannah, DO [facility] Direct contact: see [email or web address]  7PM-7AM contact night coverage as above 10/29/2021, 12:57 PM  LOS: 6 days

## 2021-10-29 NOTE — Progress Notes (Signed)
Lower extremity vein mapping has been completed.   Preliminary results in CV Proc.   Tammy Moses 10/29/2021 11:52 AM

## 2021-10-29 NOTE — Progress Notes (Signed)
[facility] Progress Note   56 y.o. female with a past medical history significant for DM2, HTN, COPD, admitted for patellar fracture.       Assessment/ Plan:   1) Patellar fracture: In immobilizer.  Management per primary team   2) ESRD: previously at [facility] now [facility] MWF schedule for now  Last HD Wed w/ 2.5L net UF with no cramping.  Next HD Fri.  3) Hypertension/volume: Blood pressure fairly high but overall improved.  Volume removal with dialysis.  Currently on amlodipine 10 mg daily, losartan 50mg  daily and hydralazine 100 mg 3 times daily. Possibly may be able to titrate down on hydralazine as we UF her on HD.   4) Anemia secondary to ESRD: Hgb 7.3, may need transfusion soon. Iron sat 7 and ferritin 742.  IV iron ordered; will hold 2 doses if transfusion is required.  Aranesp 159mcg to be given on Monday   5) secondary hyperparathyroidism: Continue PhosLo.  PTH 429. Phos 1/5 5.3.   6) Diabetes Mellitus Type 2: Continue management per primary team   7) Gangrenous right toe: Related to peripheral arterial disease.  Vascular surgery following -> planned rt fem endarterectomy and intervention to SFA/pop and peroneal on 1/6; dry gangrene rt GT  Subjective:   Still has pain in the rt GT but states pain better after pain meds. Denies f/c/n/v.   Objective:   BP (!) 141/49 (BP Location: Right Arm)    Pulse 71    Temp 98.4 F (36.9 C) (Oral)    Resp 18    Ht 5\' 1"  (1.549 m)    Wt 53.3 kg    SpO2 100%    BMI 22.20 kg/m   Intake/Output Summary (Last 24 hours) at 10/29/2021 0901 Last data filed at 10/28/2021 2100 Gross per 24 hour  Intake 480 ml  Output 2600 ml  Net -2120 ml   Weight change: 2.4 kg  Physical Exam: Constitutional: chronically ill appearing, NAD ENMT: ears and nose without scars or lesions CV: normal rate, no edema Respiratory: Bilateral chest rise, not tachypneic Gastrointestinal: SNDNT + BS Skin: gangrenous right toe,  otherwise no visible lesions or rashes Psych: alert, judgement/insight appropriate Access: Lt BBT good thrill at inflow    Imaging: PERIPHERAL VASCULAR CATHETERIZATION  Result Date: 10/27/2021 Images from the original result were not included. Patient name: Tammy Moses MRN: [id number] DOB: Nov 28, 1965 Sex: female 10/21/2021 - 10/27/2021 Pre-operative Diagnosis: Right toe ulcer Post-operative diagnosis:  Same Surgeon:  Annamarie Major Procedure Performed:  1.  Ultrasound-guided access, left femoral artery  2.  Abdominal aortogram  3.  Bilateral lower extremity runoff  4.  Second-order catheterization  5.  Conscious sedation, 36 minutes Indications: This is a 56 year old female with end-stage renal disease who has dry gangrene to her right great toe.  She comes in today for further evaluation. Procedure:  The patient was identified in the holding area and taken to room 8.  The patient was then placed supine on the table and prepped and draped in the usual sterile fashion.  A time out was called.  Conscious sedation was administered with the use of IV fentanyl and Versed under continuous physician and nurse monitoring.  Heart rate, blood pressure, and oxygen saturation were continuously monitored.  Total sedation time was 36 minutes.  Ultrasound was used to evaluate the left common femoral artery.  It was patent .  A digital ultrasound image was acquired.  A micropuncture needle was used to access  the left common femoral artery under ultrasound guidance.  An 018 wire was advanced without resistance and a micropuncture sheath was placed.  The 018 wire was removed and a benson wire was placed.  The micropuncture sheath was exchanged for a 5 french sheath.  An omniflush catheter was advanced over the wire to the level of L-1.  An abdominal angiogram was obtained.  Next, using the omniflush catheter and a benson wire, the aortic bifurcation was crossed and the catheter was placed into theright external iliac artery and  right runoff was obtained.  left runoff was performed via retrograde sheath injections. Findings:  Aortogram: No significant renal artery stenosis was identified.  The infrarenal abdominal aorta is heavily calcified but widely patent.  The right common and external iliac artery heavily calcified but without significant stenosis.  Small caliber left common and external iliac arteries with moderate left common iliac stenosis.  Right Lower Extremity: Greater than 60% right common femoral artery calcific disease.  The profundofemoral artery is small in caliber.  It has significant disease distally.  The superficial femoral artery is also very small in caliber with multiple areas of high-grade lesion/occlusions.  Popliteal artery is a very small vessel.  There is single-vessel runoff via the peroneal artery which also has multiple areas of stenosis.  Left Lower Extremity: The left common femoral artery is nearly occluded.  A large profunda branch is visualized going down the leg.  The sheath appears to be occlusive and sewed the remaining portion of the leg was not visualized. Intervention:  none Impression:  #1  Severe infrainguinal occlusive disease bilaterally  #2  On the right, there is greater than 60-70% common femoral stenosis.  In addition the superficial femoral artery is extremely small in caliber with multiple high-grade areas of stenosis.  Popliteal artery is very small in caliber and there is single-vessel runoff via a diseased peroneal artery  #3  The patient does not have good options for revascularization.  I think her best approach is going to be a right femoral endarterectomy in the operating room and simultaneous percutaneous intervention onto the peroneal artery as well as the superficial femoral-popliteal artery.  This has been scheduled for Friday. Theotis Burrow, M.D., FACS [facility] Office: [phone] Pager:  [phone]   ECHOCARDIOGRAM COMPLETE  Result  Date: 10/28/2021    ECHOCARDIOGRAM REPORT   Patient Name:   Tammy Moses Date of Exam: 10/28/2021 Medical Rec #:  [id number]      Height:       61.0 in Accession #:    [id number]     Weight:       117.7 lb Date of Birth:  [birth year]      BSA:          1.508 m Patient Age:    67 years       BP:           139/88 mmHg Patient Gender: F              HR:           72 bpm. Exam Location:  Inpatient Procedure: 2D Echo, Color Doppler and Cardiac Doppler Indications:    Murmur  History:        Patient has prior history of Echocardiogram examinations. COPD;                 Risk Factors:Diabetes and Hypertension.  Sonographer:    Jyl Heinz Referring Phys: [phone]  LINDSAY B ROBERTS IMPRESSIONS  1. Left ventricular ejection fraction, by estimation, is 60 to 65%. The left ventricle has normal function. The left ventricle has no regional wall motion abnormalities. The left ventricular internal cavity size was mildly dilated. There is mild left ventricular hypertrophy. Left ventricular diastolic parameters are consistent with Grade II diastolic dysfunction (pseudonormalization). Elevated left atrial pressure.  2. Right ventricular systolic function is normal. The right ventricular size is normal. There is normal pulmonary artery systolic pressure. The estimated right ventricular systolic pressure is 16.1 mmHg.  3. Right atrial size was mildly dilated.  4. Left atrial size was mildly dilated.  5. The mitral valve is degenerative. Mild mitral valve regurgitation. Moderate mitral stenosis. The mean mitral valve gradient is 6.0 mmHg at 73bpm. MVA 1.4cm^2 by continuity equation. Moderate mitral annular calcification.  6. Tricuspid valve regurgitation is mild to moderate.  7. The aortic valve is tricuspid. There is moderate calcification of the aortic valve. Aortic valve regurgitation is not visualized. Mild aortic valve stenosis.  8. The inferior vena cava is normal in size with greater than 50% respiratory variability, suggesting right  atrial pressure of 3 mmHg. FINDINGS  Left Ventricle: Left ventricular ejection fraction, by estimation, is 60 to 65%. The left ventricle has normal function. The left ventricle has no regional wall motion abnormalities. The left ventricular internal cavity size was mildly dilated. There is  mild left ventricular hypertrophy. Left ventricular diastolic parameters are consistent with Grade II diastolic dysfunction (pseudonormalization). Elevated left atrial pressure. Right Ventricle: The right ventricular size is normal. No increase in right ventricular wall thickness. Right ventricular systolic function is normal. There is normal pulmonary artery systolic pressure. The tricuspid regurgitant velocity is 2.65 m/s, and  with an assumed right atrial pressure of 3 mmHg, the estimated right ventricular systolic pressure is 09.6 mmHg. Left Atrium: Left atrial size was mildly dilated. Right Atrium: Right atrial size was mildly dilated. Pericardium: There is no evidence of pericardial effusion. Mitral Valve: The mitral valve is degenerative in appearance. Moderate mitral annular calcification. Mild mitral valve regurgitation. Moderate mitral valve stenosis. MV peak gradient, 13.0 mmHg. The mean mitral valve gradient is 6.0 mmHg. Tricuspid Valve: The tricuspid valve is normal in structure. Tricuspid valve regurgitation is mild to moderate. Aortic Valve: The aortic valve is tricuspid. There is moderate calcification of the aortic valve. Aortic valve regurgitation is not visualized. Mild aortic stenosis is present. Aortic valve peak gradient measures 15.1 mmHg. Pulmonic Valve: The pulmonic valve was normal in structure. Pulmonic valve regurgitation is trivial. Aorta: The aortic root and ascending aorta are structurally normal, with no evidence of dilitation. Venous: The inferior vena cava is normal in size with greater than 50% respiratory variability, suggesting right atrial pressure of 3 mmHg. IAS/Shunts: No atrial level shunt  detected by color flow Doppler.  LEFT VENTRICLE PLAX 2D LVIDd:         5.70 cm      Diastology LVIDs:         3.60 cm      LV e' medial:    5.66 cm/s LV PW:         1.20 cm      LV E/e' medial:  29.7 LV IVS:        1.20 cm      LV e' lateral:   5.77 cm/s LVOT diam:     1.80 cm      LV E/e' lateral: 29.1 LV SV:  68 LV SV Index:   45 LVOT Area:     2.54 cm  LV Volumes (MOD) LV vol d, MOD A2C: 131.0 ml LV vol d, MOD A4C: 118.0 ml LV vol s, MOD A2C: 54.4 ml LV vol s, MOD A4C: 48.5 ml LV SV MOD A2C:     76.6 ml LV SV MOD A4C:     118.0 ml LV SV MOD BP:      72.2 ml RIGHT VENTRICLE             IVC RV Basal diam:  3.80 cm     IVC diam: 1.40 cm RV Mid diam:    3.30 cm RV S prime:     10.90 cm/s TAPSE (M-mode): 3.0 cm LEFT ATRIUM             Index        RIGHT ATRIUM           Index LA diam:        3.50 cm 2.32 cm/m   RA Area:     18.60 cm LA Vol (A2C):   47.4 ml 31.44 ml/m  RA Volume:   52.10 ml  34.55 ml/m LA Vol (A4C):   58.9 ml 39.06 ml/m LA Biplane Vol: 54.5 ml 36.14 ml/m  AORTIC VALVE AV Area (Vmax): 1.56 cm AV Vmax:        194.00 cm/s AV Peak Grad:   15.1 mmHg LVOT Vmax:      119.00 cm/s LVOT Vmean:     89.900 cm/s LVOT VTI:       0.268 m  AORTA Ao Root diam: 2.50 cm Ao Asc diam:  2.80 cm MITRAL VALVE                TRICUSPID VALVE MV Area (PHT): 3.68 cm     TR Peak grad:   28.1 mmHg MV Area VTI:   1.47 cm     TR Vmax:        265.00 cm/s MV Peak grad:  13.0 mmHg MV Mean grad:  6.0 mmHg     SHUNTS MV Vmax:       1.80 m/s     Systemic VTI:  0.27 m MV Vmean:      118.0 cm/s   Systemic Diam: 1.80 cm MV Decel Time: 206 msec MV E velocity: 168.00 cm/s MV A velocity: 132.00 cm/s MV E/A ratio:  1.27 Oswaldo Milian MD Electronically signed by Oswaldo Milian MD Signature Date/Time: 10/28/2021/5:07:15 PM    Final     Labs: BMET Recent Labs  Lab 10/23/21 1001 10/25/21 0200 10/27/21 0627 10/27/21 1453 10/28/21 0832 10/29/21 0252  NA 134* 134* 134*  --  133* 132*  K 4.5 4.1 3.8  --  4.0 4.0   CL 98 94* 96*  --  95* 98  CO2 23 27 26   --  26 26  GLUCOSE 143* 133* 112*  --  110* 111*  BUN 62* 37* 21*  --  30* 18  CREATININE 11.99* 9.08* 6.47*  --  8.57* 6.35*  CALCIUM 8.8* 8.7* 9.5  --  9.8 9.5  PHOS 6.1*  --   --  4.4 5.3*  --    CBC Recent Labs  Lab 10/25/21 0200 10/27/21 0627 10/28/21 0430 10/29/21 0252  WBC 7.2 11.9* 11.5* 12.7*  NEUTROABS  --   --  6.9 8.0*  HGB 7.3* 7.6* 7.9* 8.3*  HCT 23.9* 25.3* 25.6* 27.3*  MCV 90.5 91.0 92.1 92.9  PLT  356 359 356 362    Medications:     amLODipine  10 mg Oral Daily   atropine  1 drop Both Eyes BID   buPROPion  150 mg Oral Daily   calcium acetate  2,001 mg Oral TID WC   Chlorhexidine Gluconate Cloth  6 each Topical Q0600   darbepoetin (ARANESP) injection - DIALYSIS  100 mcg Intravenous Q Mon-HD   dorzolamide-timolol  1 drop Both Eyes BID   feeding supplement (NEPRO CARB STEADY)  237 mL Oral BID BM   gabapentin  300 mg Oral q1800   heparin  5,000 Units Subcutaneous Q12H   hydrALAZINE  100 mg Oral Q8H   insulin aspart  0-6 Units Subcutaneous TID WC   labetalol  200 mg Oral BID   lamoTRIgine  25 mg Oral Daily   latanoprost  1 drop Both Eyes QHS   losartan  50 mg Oral Daily   multivitamin  1 tablet Oral QHS   pravastatin  80 mg Oral Daily   sodium chloride flush  3 mL Intravenous Q12H      Otelia Santee, MD 10/29/2021, 9:01 AM

## 2021-10-29 NOTE — Progress Notes (Signed)
Pt refuses tele monitor saying, "its too heavy and it weighs down my neck."  MD and CCMD made aware

## 2021-10-29 NOTE — Progress Notes (Addendum)
Nutrition Follow-up  DOCUMENTATION CODES:   Not applicable  INTERVENTION:   Nepro Shake po BID between meals, each supplement provides 425 kcal and 19 grams protein.  Continue Renal MVI daily.  NUTRITION DIAGNOSIS:   Increased nutrient needs related to chronic illness, other (see comment) (ESRD on HD; knee fracture) as evidenced by estimated needs.  Ongoing  GOAL:   Patient will meet greater than or equal to 90% of their needs  Progressing  MONITOR:   PO intake, Supplement acceptance, Labs, Weight trends, I & O's  REASON FOR ASSESSMENT:   Consult Hip fracture protocol  ASSESSMENT:   56 y.o. female with PMH significant of ESRD on HD, HTN, normocytic anemia 2/2 CKD, COPD, blindness, and tobacco abuse admitted with R knee patella nondisplaced fracture.  Plans for right femoral endarterectomy tomorrow 1/6 for gangrenous right toe. May require amputation.   Patient reports good intake of meals.  Meal tray at bedside 100% eaten. She says she does not need the Nepro shakes, but changed her mind and stated she needs the extra nutrition. Discussed the importance of adequate protein and calorie intake to promote healing.   Meal completions documented at 75-100%  Labs reviewed. Na 132 CBG: 115 -142  Medications reviewed and include Phoslo, Aranesp, Novolog, Rena-vit.   Weight 53.3 kg PTA EDW 54 kg per patient  NUTRITION - FOCUSED PHYSICAL EXAM:  Flowsheet Row Most Recent Value  Orbital Region No depletion  Upper Arm Region Mild depletion  Thoracic and Lumbar Region No depletion  Buccal Region No depletion  Temple Region Mild depletion  Clavicle Bone Region Mild depletion  Clavicle and Acromion Bone Region No depletion  Scapular Bone Region No depletion  Dorsal Hand Mild depletion  Patellar Region Severe depletion  Anterior Thigh Region Severe depletion  Posterior Calf Region Severe depletion  Edema (RD Assessment) Moderate  Hair Reviewed  Eyes Reviewed   Mouth Reviewed  Skin Reviewed  Nails Reviewed       Diet Order:   Diet Order             Diet NPO time specified Except for: Sips with Meds  Diet effective midnight           Diet renal/carb modified with fluid restriction Diet-HS Snack? Nothing; Fluid restriction: 1200 mL Fluid; Room service appropriate? Yes; Fluid consistency: Thin  Diet effective now                   EDUCATION NEEDS:   No education needs have been identified at this time  Skin:  Skin Assessment: Reviewed RN Assessment  Last BM:  12/28  Height:   Ht Readings from Last 1 Encounters:  10/27/21 5\' 1"  (1.549 m)    Weight:   Wt Readings from Last 1 Encounters:  10/29/21 53.3 kg    BMI:  Body mass index is 22.2 kg/m.  Estimated Nutritional Needs:   Kcal:  1700-1900  Protein:  85-95 grams  Fluid:  1L+UOP    Lucas Mallow RD, LDN, CNSC Please refer to Amion for contact information.

## 2021-10-29 NOTE — Progress Notes (Addendum)
°  Progress Note    10/29/2021 9:00 AM Hospital Day 8  Subjective:  sleeping and awakes to voice  afebrile  Vitals:   10/28/21 2331 10/29/21 0327  BP: (!) 163/54 108/60  Pulse: 74 71  Resp: 16 16  Temp: 98.9 F (37.2 C) (!) 97.4 F (36.3 C)  SpO2: 99% 90%    Physical Exam: General:  no distress; resting comfortably  CBC    Component Value Date/Time   WBC 12.7 (H) 10/29/2021 0252   RBC 2.94 (L) 10/29/2021 0252   HGB 8.3 (L) 10/29/2021 0252   HGB 12.4 12/13/2017 1416   HCT 27.3 (L) 10/29/2021 0252   HCT 37.8 12/13/2017 1416   PLT 362 10/29/2021 0252   PLT 254 12/13/2017 1416   MCV 92.9 10/29/2021 0252   MCV 83 12/13/2017 1416   MCH 28.2 10/29/2021 0252   MCHC 30.4 10/29/2021 0252   RDW 16.6 (H) 10/29/2021 0252   RDW 15.8 (H) 12/13/2017 1416   LYMPHSABS 2.5 10/29/2021 0252   LYMPHSABS 4.1 (H) 12/13/2017 1416   MONOABS 1.7 (H) 10/29/2021 0252   EOSABS 0.4 10/29/2021 0252   EOSABS 0.3 12/13/2017 1416   BASOSABS 0.1 10/29/2021 0252   BASOSABS 0.1 12/13/2017 1416    BMET    Component Value Date/Time   NA 132 (L) 10/29/2021 0252   NA 146 (H) 05/10/2017 0950   K 4.0 10/29/2021 0252   CL 98 10/29/2021 0252   CO2 26 10/29/2021 0252   GLUCOSE 111 (H) 10/29/2021 0252   BUN 18 10/29/2021 0252   BUN 38 (H) 05/10/2017 0950   CREATININE 6.35 (H) 10/29/2021 0252   CREATININE 3.40 (H) 03/11/2017 1505   CALCIUM 9.5 10/29/2021 0252   GFRNONAA 7 (L) 10/29/2021 0252   GFRNONAA 15 (L) 03/11/2017 1505   GFRAA 5 (L) 05/05/2020 1314   GFRAA 17 (L) 03/11/2017 1505    INR    Component Value Date/Time   INR 1.2 10/21/2021 1425     Intake/Output Summary (Last 24 hours) at 10/29/2021 0900 Last data filed at 10/28/2021 2100 Gross per 24 hour  Intake 480 ml  Output 2600 ml  Net -2120 ml     Assessment/Plan:  56 y.o. female with PAD Hospital Day 8  -Pt for surgery tomorrow.  [name] feels a right femoral endarterectomy in the operating room and simultaneous  percutaneous intervention onto the peroneal artery as well as the superficial femoral-popliteal artery.  -npo after MN/labs/consent ordered   Leontine Locket, PA-C [facility] [phone] 10/29/2021 9:00 AM  I agree with the above.  She has been evaluated by cardiology.  She has a normal echo.  She has been cleared for surgery with obvious risks.  I will plan on right femoral endarterectomy and angioplasty of her superficial femoral, popliteal, and tibial vessels in addition to right great toe amputation tomorrow.  I did discuss with the patient that she has poor blood flow to her foot and she may not be able to heal a toe amputation and could ultimately end up with a below-knee amputation.  She will be n.p.o. after midnight.  Annamarie Major

## 2021-10-30 ENCOUNTER — Inpatient Hospital Stay (HOSPITAL_COMMUNITY): Payer: Medicare Other | Admitting: Certified Registered"

## 2021-10-30 ENCOUNTER — Encounter (HOSPITAL_COMMUNITY)
Admission: EM | Disposition: A | Discharge status: Home Health | Payer: Self-pay | Source: Home / Self Care | Attending: Internal Medicine

## 2021-10-30 ENCOUNTER — Encounter (HOSPITAL_COMMUNITY): Payer: Self-pay | Admitting: Family Medicine

## 2021-10-30 DIAGNOSIS — R5381 Other malaise: Secondary | ICD-10-CM

## 2021-10-30 DIAGNOSIS — N186 End stage renal disease: Secondary | ICD-10-CM

## 2021-10-30 DIAGNOSIS — E1152 Type 2 diabetes mellitus with diabetic peripheral angiopathy with gangrene: Secondary | ICD-10-CM

## 2021-10-30 DIAGNOSIS — I10 Essential (primary) hypertension: Secondary | ICD-10-CM

## 2021-10-30 DIAGNOSIS — E1122 Type 2 diabetes mellitus with diabetic chronic kidney disease: Secondary | ICD-10-CM

## 2021-10-30 HISTORY — PX: AMPUTATION: SHX166

## 2021-10-30 HISTORY — PX: ENDARTERECTOMY FEMORAL: SHX5804

## 2021-10-30 LAB — POCT ACTIVATED CLOTTING TIME: Activated Clotting Time: 239 seconds

## 2021-10-30 LAB — CBC
HCT: 28 % — ABNORMAL LOW (ref 36.0–46.0)
Hemoglobin: 8.4 g/dL — ABNORMAL LOW (ref 12.0–15.0)
MCH: 28.2 pg (ref 26.0–34.0)
MCHC: 30 g/dL (ref 30.0–36.0)
MCV: 94 fL (ref 80.0–100.0)
Platelets: 390 10*3/uL (ref 150–400)
RBC: 2.98 MIL/uL — ABNORMAL LOW (ref 3.87–5.11)
RDW: 17.9 % — ABNORMAL HIGH (ref 11.5–15.5)
WBC: 12.5 10*3/uL — ABNORMAL HIGH (ref 4.0–10.5)
nRBC: 0 % (ref 0.0–0.2)

## 2021-10-30 LAB — BASIC METABOLIC PANEL
Anion gap: 13 (ref 5–15)
BUN: 29 mg/dL — ABNORMAL HIGH (ref 6–20)
CO2: 26 mmol/L (ref 22–32)
Calcium: 9.9 mg/dL (ref 8.9–10.3)
Chloride: 96 mmol/L — ABNORMAL LOW (ref 98–111)
Creatinine, Ser: 8.89 mg/dL — ABNORMAL HIGH (ref 0.44–1.00)
GFR, Estimated: 5 mL/min — ABNORMAL LOW (ref >60)
Glucose, Bld: 109 mg/dL — ABNORMAL HIGH (ref 70–99)
Potassium: 4.1 mmol/L (ref 3.5–5.1)
Sodium: 135 mmol/L (ref 135–145)

## 2021-10-30 LAB — GLUCOSE, CAPILLARY
Glucose-Capillary: 118 mg/dL — ABNORMAL HIGH (ref 70–99)
Glucose-Capillary: 89 mg/dL (ref 70–99)
Glucose-Capillary: 119 mg/dL — ABNORMAL HIGH (ref 70–99)
Glucose-Capillary: 162 mg/dL — ABNORMAL HIGH (ref 70–99)
Glucose-Capillary: 115 mg/dL — ABNORMAL HIGH (ref 70–99)

## 2021-10-30 LAB — ABO/RH: ABO/RH(D): B POS

## 2021-10-30 LAB — SURGICAL PCR SCREEN
MRSA, PCR: NEGATIVE
Staphylococcus aureus: NEGATIVE

## 2021-10-30 SURGERY — ENDARTERECTOMY, FEMORAL
Anesthesia: General | Site: Leg Lower | Laterality: Right

## 2021-10-30 MED ORDER — PROTAMINE SULFATE 10 MG/ML IV SOLN
INTRAVENOUS | Status: DC | PRN
  Administered 2021-10-30: 50 mg via INTRAVENOUS

## 2021-10-30 MED ORDER — FENTANYL CITRATE (PF) 250 MCG/5ML IJ SOLN
INTRAMUSCULAR | Status: AC
  Filled 2021-10-30: qty 5

## 2021-10-30 MED ORDER — HEPARIN SODIUM (PORCINE) 1000 UNIT/ML IJ SOLN
INTRAMUSCULAR | Status: AC
  Filled 2021-10-30: qty 20

## 2021-10-30 MED ORDER — ROCURONIUM BROMIDE 10 MG/ML (PF) SYRINGE
PREFILLED_SYRINGE | INTRAVENOUS | Status: DC | PRN
Start: 2021-10-30 — End: 2021-10-30
  Administered 2021-10-30: 50 mg via INTRAVENOUS

## 2021-10-30 MED ORDER — SODIUM CHLORIDE 0.9 % IV SOLN: INTRAVENOUS | Status: DC

## 2021-10-30 MED ORDER — CHLORHEXIDINE GLUCONATE 0.12 % MT SOLN: 15.0000 mL | Freq: Once | OROMUCOSAL | Status: AC

## 2021-10-30 MED ORDER — ONDANSETRON HCL 4 MG/2ML IJ SOLN
INTRAMUSCULAR | Status: DC | PRN
Start: 2021-10-30 — End: 2021-10-30
  Administered 2021-10-30: 4 mg via INTRAVENOUS

## 2021-10-30 MED ORDER — ONDANSETRON HCL 4 MG/2ML IJ SOLN
INTRAMUSCULAR | Status: AC
  Filled 2021-10-30: qty 2

## 2021-10-30 MED ORDER — LIDOCAINE 2% (20 MG/ML) 5 ML SYRINGE
INTRAMUSCULAR | Status: AC
  Filled 2021-10-30: qty 5

## 2021-10-30 MED ORDER — PROPOFOL 10 MG/ML IV BOLUS
INTRAVENOUS | Status: AC
  Filled 2021-10-30: qty 20

## 2021-10-30 MED ORDER — EPHEDRINE SULFATE-NACL 50-0.9 MG/10ML-% IV SOSY
PREFILLED_SYRINGE | INTRAVENOUS | Status: DC | PRN
  Administered 2021-10-30 (×2): 5 mg via INTRAVENOUS
  Administered 2021-10-30 (×2): 7.5 mg via INTRAVENOUS

## 2021-10-30 MED ORDER — ROCURONIUM BROMIDE 10 MG/ML (PF) SYRINGE
PREFILLED_SYRINGE | INTRAVENOUS | Status: AC
  Filled 2021-10-30: qty 10

## 2021-10-30 MED ORDER — PROPOFOL 10 MG/ML IV BOLUS
INTRAVENOUS | Status: DC | PRN
  Administered 2021-10-30: 100 mg via INTRAVENOUS

## 2021-10-30 MED ORDER — IODIXANOL 320 MG/ML IV SOLN
INTRAVENOUS | Status: DC | PRN
Start: 2021-10-30 — End: 2021-10-30
  Administered 2021-10-30: 37.5 mL

## 2021-10-30 MED ORDER — DEXAMETHASONE SODIUM PHOSPHATE 10 MG/ML IJ SOLN
INTRAMUSCULAR | Status: DC | PRN
  Administered 2021-10-30: 5 mg via INTRAVENOUS

## 2021-10-30 MED ORDER — 0.9 % SODIUM CHLORIDE (POUR BTL) OPTIME
TOPICAL | Status: DC | PRN
  Administered 2021-10-30: 1000 mL

## 2021-10-30 MED ORDER — HEPARIN SODIUM (PORCINE) 1000 UNIT/ML IJ SOLN
INTRAMUSCULAR | Status: DC | PRN
  Administered 2021-10-30: 3000 [IU] via INTRAVENOUS
  Administered 2021-10-30: 6000 [IU] via INTRAVENOUS
  Administered 2021-10-30: 2000 [IU] via INTRAVENOUS

## 2021-10-30 MED ORDER — HEPARIN 6000 UNIT IRRIGATION SOLUTION
Status: DC | PRN
  Administered 2021-10-30: 1

## 2021-10-30 MED ORDER — VASOPRESSIN 20 UNIT/ML IV SOLN
INTRAVENOUS | Status: AC
  Filled 2021-10-30: qty 1

## 2021-10-30 MED ORDER — LIDOCAINE 2% (20 MG/ML) 5 ML SYRINGE
INTRAMUSCULAR | Status: DC | PRN
  Administered 2021-10-30: 60 mg via INTRAVENOUS

## 2021-10-30 MED ORDER — ONDANSETRON HCL 4 MG/2ML IJ SOLN: 4.0000 mg | Freq: Four times a day (QID) | INTRAMUSCULAR | Status: DC | PRN

## 2021-10-30 MED ORDER — OXYCODONE HCL 5 MG/5ML PO SOLN: 5.0000 mg | Freq: Once | ORAL | Status: DC | PRN

## 2021-10-30 MED ORDER — FENTANYL CITRATE (PF) 250 MCG/5ML IJ SOLN
INTRAMUSCULAR | Status: DC | PRN
  Administered 2021-10-30: 75 ug via INTRAVENOUS
  Administered 2021-10-30: 25 ug via INTRAVENOUS

## 2021-10-30 MED ORDER — SUGAMMADEX SODIUM 200 MG/2ML IV SOLN
INTRAVENOUS | Status: DC | PRN
  Administered 2021-10-30: 150 mg via INTRAVENOUS

## 2021-10-30 MED ORDER — ORAL CARE MOUTH RINSE: 15.0000 mL | Freq: Once | OROMUCOSAL | Status: AC

## 2021-10-30 MED ORDER — DEXAMETHASONE SODIUM PHOSPHATE 10 MG/ML IJ SOLN
INTRAMUSCULAR | Status: AC
  Filled 2021-10-30: qty 1

## 2021-10-30 MED ORDER — OXYCODONE HCL 5 MG PO TABS: 5.0000 mg | ORAL_TABLET | Freq: Once | ORAL | Status: DC | PRN

## 2021-10-30 MED ORDER — CHLORHEXIDINE GLUCONATE 0.12 % MT SOLN
OROMUCOSAL | Status: AC
  Administered 2021-10-30: 15 mL via OROMUCOSAL
  Filled 2021-10-30: qty 15

## 2021-10-30 MED ORDER — PROTAMINE SULFATE 10 MG/ML IV SOLN
INTRAVENOUS | Status: AC
  Filled 2021-10-30: qty 5

## 2021-10-30 MED ORDER — FENTANYL CITRATE (PF) 100 MCG/2ML IJ SOLN: 25.0000 ug | INTRAMUSCULAR | Status: DC | PRN

## 2021-10-30 MED ORDER — ALBUMIN HUMAN 5 % IV SOLN: INTRAVENOUS | Status: DC | PRN

## 2021-10-30 MED ORDER — HEPARIN SODIUM (PORCINE) 1000 UNIT/ML IJ SOLN
INTRAMUSCULAR | Status: AC
  Filled 2021-10-30: qty 1

## 2021-10-30 MED ORDER — PHENYLEPHRINE 40 MCG/ML (10ML) SYRINGE FOR IV PUSH (FOR BLOOD PRESSURE SUPPORT)
PREFILLED_SYRINGE | INTRAVENOUS | Status: DC | PRN
  Administered 2021-10-30: 80 ug via INTRAVENOUS
  Administered 2021-10-30 (×2): 120 ug via INTRAVENOUS

## 2021-10-30 MED ORDER — HEPARIN 6000 UNIT IRRIGATION SOLUTION
Status: AC
  Filled 2021-10-30: qty 500

## 2021-10-30 MED ORDER — PHENYLEPHRINE HCL-NACL 20-0.9 MG/250ML-% IV SOLN
INTRAVENOUS | Status: DC | PRN
  Administered 2021-10-30: 50 ug/min via INTRAVENOUS

## 2021-10-30 SURGICAL SUPPLY — 85 items
ADH SKN CLS APL DERMABOND .7 (GAUZE/BANDAGES/DRESSINGS) ×1
BAG COUNTER SPONGE SURGICOUNT (BAG) ×3 IMPLANT
BAG SPNG CNTER NS LX DISP (BAG) ×2
BALLN STERLING OTW 2X220X150 (BALLOONS) ×3
BALLOON STERLING OTW 2X220X150 (BALLOONS) IMPLANT
BLADE AVERAGE 25X9 (BLADE) ×3 IMPLANT
BLADE SAW SGTL 81X20 HD (BLADE) IMPLANT
BNDG ELASTIC 4X5.8 VLCR STR LF (GAUZE/BANDAGES/DRESSINGS) ×3 IMPLANT
BNDG ELASTIC 6X5.8 VLCR STR LF (GAUZE/BANDAGES/DRESSINGS) ×1 IMPLANT
BNDG GAUZE ELAST 4 BULKY (GAUZE/BANDAGES/DRESSINGS) ×3 IMPLANT
CANISTER SUCT 3000ML PPV (MISCELLANEOUS) ×3 IMPLANT
CATH ANGIO 5F BER 100CM (CATHETERS) ×1 IMPLANT
CATH EMB 4FR 40CM (CATHETERS) ×3 IMPLANT
CATH NAVICROSS ANGLED 90CM (MICROCATHETER) ×1 IMPLANT
CATH QUICKCROSS SUPP .035X90CM (MICROCATHETER) ×1 IMPLANT
CLIP VESOCCLUDE MED 24/CT (CLIP) ×3 IMPLANT
CLIP VESOCCLUDE SM WIDE 24/CT (CLIP) ×3 IMPLANT
COVER PROBE W GEL 5X96 (DRAPES) ×1 IMPLANT
COVER SURGICAL LIGHT HANDLE (MISCELLANEOUS) ×3 IMPLANT
DERMABOND ADVANCED (GAUZE/BANDAGES/DRESSINGS) ×1
DERMABOND ADVANCED .7 DNX12 (GAUZE/BANDAGES/DRESSINGS) ×2 IMPLANT
DEVICE TORQUE H2O (MISCELLANEOUS) ×1 IMPLANT
DRAIN CHANNEL 15F RND FF W/TCR (WOUND CARE) IMPLANT
DRAPE EXTREMITY T 121X128X90 (DISPOSABLE) ×3 IMPLANT
DRAPE HALF SHEET 40X57 (DRAPES) ×3 IMPLANT
DRAPE X-RAY CASS 24X20 (DRAPES) IMPLANT
DRSG EMULSION OIL 3X3 NADH (GAUZE/BANDAGES/DRESSINGS) ×1 IMPLANT
ELECT REM PT RETURN 9FT ADLT (ELECTROSURGICAL) ×3
ELECTRODE REM PT RTRN 9FT ADLT (ELECTROSURGICAL) ×2 IMPLANT
EVACUATOR SILICONE 100CC (DRAIN) IMPLANT
GAUZE SPONGE 4X4 12PLY STRL (GAUZE/BANDAGES/DRESSINGS) ×3 IMPLANT
GAUZE SPONGE 4X4 12PLY STRL LF (GAUZE/BANDAGES/DRESSINGS) ×1 IMPLANT
GLIDEWIRE ADV .035X180CM (WIRE) ×1 IMPLANT
GLOVE BIOGEL PI IND STRL 7.5 (GLOVE) IMPLANT
GLOVE BIOGEL PI INDICATOR 7.5 (GLOVE) ×1
GLOVE SRG 8 PF TXTR STRL LF DI (GLOVE) ×2 IMPLANT
GLOVE SURG MICRO LTX SZ6.5 (GLOVE) ×1 IMPLANT
GLOVE SURG POLYISO LF SZ6.5 (GLOVE) ×1 IMPLANT
GLOVE SURG POLYISO LF SZ7.5 (GLOVE) ×3 IMPLANT
GLOVE SURG PR MICRO ENCORE 7 (GLOVE) ×1 IMPLANT
GLOVE SURG PR MICRO ENCORE 7.5 (GLOVE) ×2 IMPLANT
GLOVE SURG UNDER POLY LF SZ6.5 (GLOVE) ×1 IMPLANT
GLOVE SURG UNDER POLY LF SZ7 (GLOVE) ×3 IMPLANT
GLOVE SURG UNDER POLY LF SZ8 (GLOVE) ×3
GOWN STRL REUS W/ TWL LRG LVL3 (GOWN DISPOSABLE) ×4 IMPLANT
GOWN STRL REUS W/ TWL XL LVL3 (GOWN DISPOSABLE) ×2 IMPLANT
GOWN STRL REUS W/TWL LRG LVL3 (GOWN DISPOSABLE) ×12
GOWN STRL REUS W/TWL XL LVL3 (GOWN DISPOSABLE) ×9
GUIDEWIRE ANGLED .035X150CM (WIRE) ×1 IMPLANT
HEMOSTAT SNOW SURGICEL 2X4 (HEMOSTASIS) IMPLANT
KIT BASIN OR (CUSTOM PROCEDURE TRAY) ×3 IMPLANT
KIT TURNOVER KIT B (KITS) ×3 IMPLANT
NDL HYPO 25GX1X1/2 BEV (NEEDLE) IMPLANT
NEEDLE HYPO 25GX1X1/2 BEV (NEEDLE) IMPLANT
NS IRRIG 1000ML POUR BTL (IV SOLUTION) ×6 IMPLANT
PACK GENERAL/GYN (CUSTOM PROCEDURE TRAY) ×3 IMPLANT
PACK PERIPHERAL VASCULAR (CUSTOM PROCEDURE TRAY) ×3 IMPLANT
PAD ARMBOARD 7.5X6 YLW CONV (MISCELLANEOUS) ×6 IMPLANT
SET COLLECT BLD 21X3/4 12 (NEEDLE) IMPLANT
SET WALTER ACTIVATION W/DRAPE (SET/KITS/TRAYS/PACK) IMPLANT
SHEATH PINNACLE 5F 10CM (SHEATH) ×1 IMPLANT
SHEATH PINNACLE ST 6F 45CM (SHEATH) ×1 IMPLANT
SHIELD RADPAD SCOOP 12X17 (MISCELLANEOUS) ×1 IMPLANT
SPONGE INTESTINAL PEANUT (DISPOSABLE) ×3 IMPLANT
STOPCOCK 4 WAY LG BORE MALE ST (IV SETS) ×2 IMPLANT
SUT ETHILON 3 0 PS 1 (SUTURE) ×5 IMPLANT
SUT MNCRL AB 4-0 PS2 18 (SUTURE) ×1 IMPLANT
SUT PROLENE 5 0 C 1 24 (SUTURE) ×9 IMPLANT
SUT PROLENE 6 0 BV (SUTURE) ×4 IMPLANT
SUT SILK 3 0 (SUTURE) ×3
SUT SILK 3-0 18XBRD TIE 12 (SUTURE) IMPLANT
SUT VIC AB 2-0 CT1 27 (SUTURE) ×6
SUT VIC AB 2-0 CT1 TAPERPNT 27 (SUTURE) ×2 IMPLANT
SUT VIC AB 3-0 SH 27 (SUTURE) ×3
SUT VIC AB 3-0 SH 27X BRD (SUTURE) ×2 IMPLANT
SUT VIC AB 3-0 X1 27 (SUTURE) ×3 IMPLANT
SYR 30ML LL (SYRINGE) ×1 IMPLANT
SYR 3ML LL SCALE MARK (SYRINGE) ×3 IMPLANT
SYR CONTROL 10ML LL (SYRINGE) IMPLANT
TOWEL GREEN STERILE (TOWEL DISPOSABLE) ×6 IMPLANT
TUBING EXTENTION W/L.L. (IV SETS) IMPLANT
UNDERPAD 30X36 HEAVY ABSORB (UNDERPADS AND DIAPERS) ×3 IMPLANT
WATER STERILE IRR 1000ML POUR (IV SOLUTION) ×3 IMPLANT
WIRE BENTSON .035X145CM (WIRE) ×1 IMPLANT
WIRE G V18X300CM (WIRE) ×1 IMPLANT

## 2021-10-30 NOTE — Progress Notes (Signed)
Patient pulled out her PIVs

## 2021-10-30 NOTE — Discharge Instructions (Signed)
 [facility] [place]  Discharge instructions  Lower Extremity Bypass Surgery  Please refer to the following instruction for your post-procedure care. Your surgeon or physician assistant will discuss any changes with you.  Activity  You are encouraged to walk as much as you can. You can slowly return to normal activities during the month after your surgery. Avoid strenuous activity and heavy lifting until your doctor tells you it's OK. Avoid activities such as vacuuming or swinging a golf club. Do not drive until your doctor give the OK and you are no longer taking prescription pain medications. It is also normal to have difficulty with sleep habits, eating and bowel movement after surgery. These will go away with time.  Bathing/Showering  Shower daily after you go home. Do not soak in a bathtub, hot tub, or swim until the incision heals completely.  Incision Care  Clean your incision with mild soap and water. Shower every day. Pat the area dry with a clean towel. You do not need a bandage unless otherwise instructed. Do not apply any ointments or creams to your incision. If you have open wounds you will be instructed how to care for them or a visiting nurse may be arranged for you. If you have staples or sutures along your incision they will be removed at your post-op appointment. You may have skin glue on your incision. Do not peel it off. It will come off on its own in about one week.  Wash the groin wound with soap and water daily and pat dry. (No tub bath-only shower)  Then put a dry gauze or washcloth in the groin to keep this area dry to help prevent wound infection.  Do this daily and as needed.  Do not use Vaseline or neosporin on your incisions.  Only use soap and water on your incisions and then protect and keep dry.  Diet  Resume your normal diet. There are no special food restrictions following this procedure. A low fat/ low cholesterol diet is  recommended for all patients with vascular disease. In order to heal from your surgery, it is CRITICAL to get adequate nutrition. Your body requires vitamins, minerals, and protein. Vegetables are the best source of vitamins and minerals. Vegetables also provide the perfect balance of protein. Processed food has little nutritional value, so try to avoid this.  Medications  Resume taking all your medications unless your doctor or physician assistant tells you not to. If your incision is causing pain, you may take over-the-counter pain relievers such as acetaminophen (Tylenol). If you were prescribed a stronger pain medication, please aware these medication can cause nausea and constipation. Prevent nausea by taking the medication with a snack or meal. Avoid constipation by drinking plenty of fluids and eating foods with high amount of fiber, such as fruits, vegetables, and grains. Take Colace 100 mg (an over-the-counter stool softener) twice a day as needed for constipation.  Do not take Tylenol if you are taking prescription pain medications.  Follow Up  Our office will schedule a follow up appointment 2-3 weeks following discharge.  Please call us immediately for any of the following conditions  Severe or worsening pain in your legs or feet while at rest or while walking Increase pain, redness, warmth, or drainage (pus) from your incision site(s) Fever of 101 degree or higher The swelling in your leg with the bypass suddenly worsens and becomes more painful than when you were in the hospital If you have   been instructed to feel your graft pulse then you should do so every day. If you can no longer feel this pulse, call the office immediately. Not all patients are given this instruction.  Leg swelling is common after leg bypass surgery.  The swelling should improve over a few months following surgery. To improve the swelling, you may elevate your legs above the level of your heart while you are  sitting or resting. Your surgeon or physician assistant may ask you to apply an ACE wrap or wear compression (TED) stockings to help to reduce swelling.  Reduce your risk of vascular disease  Stop smoking. If you would like help call [facility] at 1-800-QUIT-NOW (1-800-784-8669) or [place] at [phone].  Manage your cholesterol Maintain a desired weight Control your diabetes weight Control your diabetes Keep your blood pressure down  If you have any questions, please call the office at [phone]  

## 2021-10-30 NOTE — Progress Notes (Signed)
PROGRESS NOTE  Tammy Moses WCH:[id number] DOB: 05/03/1966 DOA: 10/21/2021 PCP: [name], FNP  Brief History   Patient is a 56 year old female with history of ESRD on dialysis MWF, hypertension, normocytic anemia, COPD, blindness, tobacco use who presented with after falling down on her right knee when she slipped on ice.  She was unable to stand up and complained of pain and swelling of right knee and was brought to the emergency department.  Imaging showed nondisplaced right patella fracture on x-ray.  Orthopedics was consulted and recommended right knee brace with PT and follow-up as an outpatient.  Patient was also found to have right toe gangrene, ABI showed severe peripheral artery disease.  Vascular surgery following, underwent  left lower extremity arteriography on 1/3 with finding of severe infrainguinal occlusive disease bilaterally.  She will go for right femoral endarterectomy and percutaneous intervention later today.  PT/OT recommending SNF on DC,TOC consulted.  Consultants  Vascular surgery Cardiology  Procedures    Antibiotics   Anti-infectives (From admission, onward)    Start     Dose/Rate Route Frequency Ordered Stop   10/30/21 0600  [MAR Hold]  ceFAZolin (ANCEF) IVPB 1 g/50 mL premix        (MAR Hold since Fri 10/30/2021 at 0949.Hold Reason: Transfer to a Procedural area)  Note to Pharmacy: Send with pt to OR   1 g 100 mL/hr over 30 Minutes Intravenous To Short Stay 10/29/21 0903 10/30/21 1102        Subjective  The patient is resting comfortably. No new complaints.   Objective   Vitals:  Vitals:   10/30/21 0741 10/30/21 0955  BP: (!) 164/66 (!) 186/42  Pulse: 71 68  Resp: 18 17  Temp: 98.2 F (36.8 C) 98.2 F (36.8 C)  SpO2: 99% 94%    Exam:  Constitutional:  The patient is awake, alert, and oriented x 3. No acute distress. Respiratory:  No increased work of breathing. No wheezes, rales, or rhonchi No tactile fremitus Cardiovascular:   Regular rate and rhythm No murmurs, ectopy, or gallups. No lateral PMI. No thrills. Abdomen:  Abdomen is soft, non-tender, non-distended No hernias, masses, or organomegaly Normoactive bowel sounds.  Musculoskeletal:  No cyanosis, clubbing, or edema Skin:  No rashes, lesions, ulcers palpation of skin: no induration or nodules Neurologic:  CN 2-12 intact Sensation all 4 extremities intact Psychiatric:  Mental status Mood, affect appropriate Orientation to person, place, time  judgment and insight appear intact   I have personally reviewed the following:   Today's Data  Vitals  Lab Data  CBC, BMP  Imaging    Cardiology Data  Echocardiogram  Other Data    Scheduled Meds:  [MAR Hold] amLODipine  10 mg Oral Daily   [MAR Hold] atropine  1 drop Both Eyes BID   [MAR Hold] buPROPion  150 mg Oral Daily   [MAR Hold] calcium acetate  2,001 mg Oral TID WC   [MAR Hold] Chlorhexidine Gluconate Cloth  6 each Topical Q0600   [MAR Hold] darbepoetin (ARANESP) injection - DIALYSIS  100 mcg Intravenous Q Mon-HD   [MAR Hold] dorzolamide-timolol  1 drop Both Eyes BID   [MAR Hold] feeding supplement (NEPRO CARB STEADY)  237 mL Oral BID BM   [MAR Hold] gabapentin  300 mg Oral q1800   [MAR Hold] heparin  5,000 Units Subcutaneous Q12H   [MAR Hold] hydrALAZINE  100 mg Oral Q8H   [MAR Hold] insulin aspart  0-6 Units Subcutaneous TID WC   [  MAR Hold] labetalol  200 mg Oral BID   [MAR Hold] lamoTRIgine  25 mg Oral Daily   [MAR Hold] latanoprost  1 drop Both Eyes QHS   [MAR Hold] losartan  75 mg Oral Daily   [MAR Hold] multivitamin  1 tablet Oral QHS   [MAR Hold] pravastatin  80 mg Oral Daily   [MAR Hold] sodium chloride flush  3 mL Intravenous Q12H   Continuous Infusions:  [MAR Hold] sodium chloride     sodium chloride 10 mL/hr at 10/30/21 1040   sodium chloride 10 mL/hr at 10/30/21 1040    Principal Problem:   Closed patellar sleeve fracture of right knee Active Problems:    Patellar sleeve fracture of right knee, closed, initial encounter   Gangrene of foot (HCC)   LOS: 7 days    A & P  Right knee patella nondisplaced fracture: Slipped on ice and fell on right knee.  Orthopedics consulted, recommended knee immobilizer, follow-up as an outpatient with orthopedics.  Currently on knee mobilizer.  PT/OT consulted recommended skilled NF on discharge.   Right big toe gangrene: ABI showed severe peripheral artery disease.  CT angiogram showed severe atherosclerotic disease throughout the abdomen, pelvis and bilateral lower extremities,severe bilateral outflow disease .Vascular surgery following and underwent  left lower extremity arteriography on 1/3 with finding of severe infrainguinal occlusive disease bilaterally.  She will go for right femoral endarterectomy and percutaneous intervention later today.  Cardiology consulted for clearance for surgery. Echocardiogram was performed on 10/28/2020.  It has demonstrated EF of 60-65%, Normal LV function, and no regional wall motion abnormalities. LV cavity is mildly dilated. There is grade II diastolic dysfunction.   ESRD on dialysis: Dialyzed on Monday, Wednesday, Friday. nephrology following.   Diabetes type 2: Currently on sliding insulin scale.  Monitor blood sugars. FSBS 89-142 in the last 24 hours.   Anemia of chronic disease: Hemoglobin is stable at 8.3 today. Iron panel showed severe iron deficiency, given IV iron.  Also aranesp.  Monitor CBC.  Will transfuse if hemoglobin drops less than 7.   Hypertension: Monitor blood pressure.  Continue amlodipine, hydralazine, labetalol, losartan. BP remains elevated. Pt is due for HD today.   Debility/deconditioning: PT/OT recommending skilled nursing facility on discharge.  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  Social worker following.   I have seen and examined this patient myself. I have spent 30 minutes in her evaluation and care.   DVT prophylaxis:Hep [place] Code Status:  Full Family Communication:: None available Patient status:Inpatient   Dispo: The patient is from: Home              Anticipated d/c is to:SNF               Anticipated d/c date is: Awaiting vascular surgery procedure  Tehillah Cipriani, DO [facility] Direct contact: see [email or web address]  7PM-7AM contact night coverage as above 10/30/2021, 1:56 PM  LOS: 6 days

## 2021-10-30 NOTE — Interval H&P Note (Signed)
History and Physical Interval Note:  10/30/2021 10:29 AM  Tammy Moses  has presented today for surgery, with the diagnosis of PAD, Dry gangrene right great toe.  The various methods of treatment have been discussed with the patient and family. After consideration of risks, benefits and other options for treatment, the patient has consented to  Procedure(s): RIGHT FEMORAL ENDARTERECTOMY WITH ANGIOPLASTY OF TIBIAL ARTERY (Right) RIGHT GREAT TOE AMPUTATION (Right) as a surgical intervention.  The patient's history has been reviewed, patient examined, no change in status, stable for surgery.  I have reviewed the patient's chart and labs.  Questions were answered to the patient's satisfaction.     Annamarie Major

## 2021-10-30 NOTE — Op Note (Signed)
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  The stenosis within the common femoral artery was at least 60-70%.  I performed endarterectomy up under the inguinal ligament.  The arteriotomy was extended down onto the superficial femoral artery for approximately 2 cm.  There was no end to the calcification and so I removed as much plaque as I could in the superficial femoral artery, making sure there were no potential flaps.  Similarly endarterectomy was performed in the profundofemoral artery.  A vein patch was placed.  I tried to cross the occluded superficial femoral artery, however I was unable to get through the calcification within the adductor canal.  The patient did not have adequate vessels for surgical bypass.  I performed right great toe amputation including the metatarsal head.  The bone was healthy.  Surprisingly there did appear to be adequate capillary bleeding at the amputation site.  Indications: This is a 56 year old female with end-stage renal disease and dry gangrene on her right great toe.  She underwent angiography which revealed severe stenosis within the common femoral artery and diffuse calcification with occlusion of the superficial femoral and popliteal artery and single-vessel runoff via the peroneal artery which was heavily calcified and diseased.  I did  not think that the patient was an adequate surgical candidate for bypass.  I felt her best option for limb salvage was a femoral endarterectomy and attempts at percutaneous revascularization of her outflow and runoff.  She consented to the procedure.  She understands that she is at risk for nonhealing of her amputation site and possibly requiring a higher transtibial amputation  Procedure:  The patient was identified in the holding area and taken to [place] 16  The patient was then placed supine on the table. general anesthesia was administered.  The patient was prepped and draped in the usual sterile fashion.  A time out was called and antibiotics were administered.  A PA was necessary to expedite the procedure and assist with technical details.  A longitudinal incision was made in the right groin.  Cautery was used about subcutaneous tissue down to the femoral sheath which was opened sharply.  I exposed the common femoral artery from the inguinal ligament down to the bifurcation.  I individually exposed the superficial femoral artery for approximately 4 cm.  The profunda was dissected out past his primary branch.  The arteries were heavily calcified with no soft areas.  The patient was then fully heparinized.  Heparin levels were monitored with ACT checks during the procedure.  I then occluded the vessels with vascular clamps and a #11 blade was used to make an arteriotomy in the common femoral artery which was extended longitudinally with Potts scissors.  The clamps were unable to provide adequate hemostasis and so I passed #4 Fogarty catheters down the superficial femoral artery and up into the external iliac artery for control.  I then proceeded with endarterectomy of the  common femoral artery.  I reached up under the inguinal ligament and removed as much plaque as I could visualize.  At this point I had excellent inflow.  I then proceeded with endarterectomy down on the superficial femoral artery.   There was no good endpoint and so I transected as much plaque as I could.  There was no need to tack down the endpoint.  Similarly eversion endarterectomy of the profundofemoral artery was performed.  Once I was satisfied with the endarterectomy, I used the saphenous vein which had previously exposed for a vein patch.  Patch angioplasty was performed with the vein using a running 5-0 Prolene.  Prior to completion the appropriate flushing maneuvers were performed and the anastomosis was completed.  The clamps were released.  There was brisk profunda Doppler flow and flow in the superficial femoral artery.  Next, an 18-gauge needle was used to cannulate the common femoral artery through the vein patch.  A Bentson wire was inserted into the superficial femoral artery and a 6 French 45 cm sheath was placed.  I initially used a V-18 wire and a 2 x 150 Sterling balloon to try to advance a wire through the plaque.  I could not pass the wire past the occlusion in the adductor canal.  I then switched out to an 035 platform using both a quick cross catheter and Glidewire and ultimately a glide catheter and a Glidewire advantage.  I was unsuccessful in crossing the occlusion within the adductor canal.  After multiple failed attempts, I did not think that it was possible to cross this lesion.  She is not a candidate for retrograde cannulation.  I felt that this was all that could be done for revascularization.  The sheath, catheter, and wires were then removed and the defect within the vein patch was oversewn with a 5-0 Prolene suture.  The wounds were then irrigated.  Hemostasis was achieved.  The femoral sheath was reapproximated with 2-0 Vicryl.  The subcutaneous tissue was then closed with additional layers with 3-0 Vicryl followed by subcuticular closure and Dermabond.  Attention was then turned towards the necrotic right great toe.  A racquet type incision was made along the toe.  The incision was carried down to the  bone.  A periosteal elevator was used to elevate the periosteum.  The toe was removed with large bone cutters at the level of the metatarsal head.  A rongeur was used to further debride back the bone.  A rasp was used to smooth the bone surface.  The bone was hard without evidence of infection.  There was no purulence within the amputation site.  Surprisingly, she did appear to have adequate, healthy tissue with good capillary bleeding.  The wound was then irrigated.  I closed the skin with interrupted 3-0 nylon vertical mattress sutures.  Sterile dressings were applied.  The patient was then successfully extubated and taken to recovery in stable condition.  There were no immediate complications.   Disposition: To PACU stable.   Theotis Burrow, M.D., [facility] [place] Office: [phone] Pager:  [phone]

## 2021-10-30 NOTE — Anesthesia Procedure Notes (Signed)
Procedure Name: Intubation Date/Time: 10/30/2021 10:56 AM Performed by: Imagene Riches, CRNA Pre-anesthesia Checklist: Patient identified, Emergency Drugs available, Suction available and Patient being monitored Patient Re-evaluated:Patient Re-evaluated prior to induction Oxygen Delivery Method: Circle System Utilized Preoxygenation: Pre-oxygenation with 100% oxygen Induction Type: IV induction Ventilation: Mask ventilation without difficulty Laryngoscope Size: Glidescope and 3 Grade View: Grade I Tube type: Oral Tube size: 7.0 mm Number of attempts: 1 Airway Equipment and Method: Stylet and Oral airway Placement Confirmation: ETT inserted through vocal cords under direct vision, positive ETCO2 and breath sounds checked- equal and bilateral Secured at: 22 cm Tube secured with: Tape Dental Injury: Teeth and Oropharynx as per pre-operative assessment  Comments: Elective glide scope due to poor dentition

## 2021-10-30 NOTE — Transfer of Care (Signed)
Immediate Anesthesia Transfer of Care Note  Patient: Tammy Moses  Procedure(s) Performed: RIGHT FEMORAL ENDARTERECTOMY WITH ANGIOPLASTY OF TIBIAL ARTERY (Right: Leg Lower) RIGHT GREAT TOE AMPUTATION (Right: Foot)  Patient Location: PACU  Anesthesia Type:General  Level of Consciousness: drowsy  Airway & Oxygen Therapy: Patient Spontanous Breathing and Patient connected to nasal cannula oxygen  Post-op Assessment: Report given to RN and Post -op Vital signs reviewed and stable  Post vital signs: Reviewed and stable  Last Vitals:  Vitals Value Taken Time  BP 136/46 10/30/21 1451  Temp    Pulse 69 10/30/21 1454  Resp 18 10/30/21 1454  SpO2 100 % 10/30/21 1454  Vitals shown include unvalidated device data.  Last Pain:  Vitals:   10/30/21 0959  TempSrc:   PainSc: 7       Patients Stated Pain Goal: 0 (12/78/71 8367)  Complications: No notable events documented.

## 2021-10-30 NOTE — Progress Notes (Signed)
Mobility Specialist Progress Note:   10/30/21 1040  Mobility  Activity Off unit   Will follow-up as time allows.   [place] [facility] Public librarian Phone [phone] Secondary Phone [phone]

## 2021-10-30 NOTE — Anesthesia Preprocedure Evaluation (Signed)
Anesthesia Evaluation  Patient identified by MRN, date of birth, ID band Patient awake    Reviewed: Allergy & Precautions, H&P , NPO status , Patient's Chart, lab work & pertinent test results  Airway Mallampati: II   Neck ROM: full    Dental   Pulmonary asthma , COPD, Current Smoker,    breath sounds clear to auscultation       Cardiovascular hypertension,  Rhythm:regular Rate:Normal     Neuro/Psych PSYCHIATRIC DISORDERS Anxiety Depression  Neuromuscular disease    GI/Hepatic PUD, GERD  ,(+) Hepatitis -, C  Endo/Other  diabetes, Type 2  Renal/GU ESRF and DialysisRenal disease     Musculoskeletal   Abdominal   Peds  Hematology  (+) Blood dyscrasia, anemia ,   Anesthesia Other Findings   Reproductive/Obstetrics                             Anesthesia Physical Anesthesia Plan  ASA: 3  Anesthesia Plan: General   Post-op Pain Management:    Induction: Intravenous  PONV Risk Score and Plan: 2 and Ondansetron, Dexamethasone, Treatment may vary due to age or medical condition and Midazolam  Airway Management Planned: Oral ETT  Additional Equipment:   Intra-op Plan:   Post-operative Plan: Extubation in OR  Informed Consent: I have reviewed the patients History and Physical, chart, labs and discussed the procedure including the risks, benefits and alternatives for the proposed anesthesia with the patient or authorized representative who has indicated his/her understanding and acceptance.     Dental advisory given  Plan Discussed with: CRNA, Anesthesiologist and Surgeon  Anesthesia Plan Comments:         Anesthesia Quick Evaluation

## 2021-10-30 NOTE — Progress Notes (Signed)
Patient arrived back to [facility] room 17 from PACU. Refused telemetry monitor. VSS. Patient more confused than when I saw her this AM. Notified MD. Fall precautions in place. Will continue to monitor. Edwena Blow, RN

## 2021-10-30 NOTE — Progress Notes (Signed)
Occupational Therapy Treatment Patient Details Name: Tammy Moses MRN: [id number] DOB: 1966-07-18 Today's Date: 10/30/2021   History of present illness Pt is a 56 y/o female presenting to the ED on 12/28 secondary to fall. Found to have R patellar fx. Pt also with gangrene of R great toe, plan for right femoral endarterectomy and percutaneous intervention 1/6. PMH includes ESRD on HD, HTN, DM, COPD, blindness, tobacco abuse.   OT comments  Pt. Seen for skilled OT treatment session.  Pt. Able to complete in/out of bed with S.  Ambulated to/from b.room for toileting task min guard a with verbal and tactile cues for rw management.  C/o fatigue during ambulation but able to maintain NWB throughout.  Pt. Progressing well Current recommendations remain appropriate.     Recommendations for follow up therapy are one component of a multi-disciplinary discharge planning process, led by the attending physician.  Recommendations may be updated based on patient status, additional functional criteria and insurance authorization.    Follow Up Recommendations  Skilled nursing-short term rehab (<3 hours/day)    Assistance Recommended at Discharge Frequent or constant Supervision/Assistance  Patient can return home with the following  A lot of help with walking and/or transfers;Two people to help with walking and/or transfers;A lot of help with bathing/dressing/bathroom;Assistance with cooking/housework;Help with stairs or ramp for entrance;Assist for transportation   Equipment Recommendations  BSC/3in1;None recommended by OT;Other (comment)    Recommendations for Other Services      Precautions / Restrictions Precautions Precautions: Fall;Other (comment) Precaution Comments: blind, R foot drop Required Braces or Orthoses: Knee Immobilizer - Right Knee Immobilizer - Right: On at all times Restrictions RLE Weight Bearing: Non weight bearing       Mobility Bed Mobility Overal bed mobility: Needs  Assistance Bed Mobility: Supine to Sit;Sit to Supine     Supine to sit: Supervision Sit to supine: Supervision        Transfers Overall transfer level: Needs assistance Equipment used: Rolling walker (2 wheels) Transfers: Sit to/from Stand;Bed to chair/wheelchair/BSC Sit to Stand: Min assist Stand pivot transfers: Min assist         General transfer comment: minA to power up and steady, pt able to maintain NWB RLE well without much assist or cues     Balance                                           ADL either performed or assessed with clinical judgement   ADL Overall ADL's : Needs assistance/impaired     Grooming: Wash/dry hands;Set up;Sitting                   Toilet Transfer: Min guard;Rolling walker (2 wheels);Ambulation;Regular Toilet;BSC/3in1;Cueing for sequencing;Cueing for safety Toilet Transfer Details (indicate cue type and reason): 3n1 over commode-cues for direction and management of rw secondary to visual impairments Toileting- Clothing Manipulation and Hygiene: Set up;Sitting/lateral lean       Functional mobility during ADLs: Min guard;Cueing for sequencing;Cueing for safety General ADL Comments: Min Guard A for safety    Extremity/Trunk Assessment              Vision       Perception     Praxis      Cognition Arousal/Alertness: Awake/alert Behavior During Therapy: WFL for tasks assessed/performed Overall Cognitive Status: No family/caregiver present to determine baseline cognitive functioning  General Comments: pt able to follow all cues and instructions          Exercises     Shoulder Instructions       General Comments      Pertinent Vitals/ Pain       Pain Assessment: Faces Faces Pain Scale: Hurts even more Pain Location: to pf R foot Pain Descriptors / Indicators: Grimacing;[place]                                           Prior Functioning/Environment              Frequency  Min 2X/week        Progress Toward Goals  OT Goals(current goals can now be found in the care plan section)  Progress towards OT goals: Progressing toward goals     Plan Discharge plan remains appropriate;Frequency remains appropriate    Co-evaluation                 AM-PAC OT "6 Clicks" Daily Activity     Outcome Measure   Help from another person eating meals?: None Help from another person taking care of personal grooming?: A Little Help from another person toileting, which includes using toliet, bedpan, or urinal?: A Lot Help from another person bathing (including washing, rinsing, drying)?: A Lot Help from another person to put on and taking off regular upper body clothing?: A Little Help from another person to put on and taking off regular lower body clothing?: Total 6 Click Score: 15    End of Session Equipment Utilized During Treatment: Rolling walker (2 wheels);Gait belt;Right knee immobilizer  OT Visit Diagnosis: Unsteadiness on feet (R26.81);Muscle weakness (generalized) (M62.81);Low vision, both eyes (H54.2)   Activity Tolerance Patient tolerated treatment well   Patient Left in bed;with call bell/phone within reach   Nurse Communication Other (comment) (alerted rn with pt. c/o "itchy" around IV site)        Time: 5686-1683 OT Time Calculation (min): 18 min  Charges: OT General Charges $OT Visit: 1 Visit OT Treatments $Self Care/Home Management : 8-22 mins  Sonia Baller, COTA/L Acute Rehabilitation [phone]   Tanya Nones 10/30/2021, 9:32 AM

## 2021-10-31 LAB — CBC
HCT: 21.7 % — ABNORMAL LOW (ref 36.0–46.0)
Hemoglobin: 6.7 g/dL — CL (ref 12.0–15.0)
MCH: 29.4 pg (ref 26.0–34.0)
MCHC: 30.9 g/dL (ref 30.0–36.0)
MCV: 95.2 fL (ref 80.0–100.0)
Platelets: 346 10*3/uL (ref 150–400)
RBC: 2.28 MIL/uL — ABNORMAL LOW (ref 3.87–5.11)
RDW: 18.8 % — ABNORMAL HIGH (ref 11.5–15.5)
WBC: 15.7 10*3/uL — ABNORMAL HIGH (ref 4.0–10.5)
nRBC: 0 % (ref 0.0–0.2)

## 2021-10-31 LAB — RENAL FUNCTION PANEL
Albumin: 3 g/dL — ABNORMAL LOW (ref 3.5–5.0)
Anion gap: 14 (ref 5–15)
BUN: 41 mg/dL — ABNORMAL HIGH (ref 6–20)
CO2: 23 mmol/L (ref 22–32)
Calcium: 9.7 mg/dL (ref 8.9–10.3)
Chloride: 99 mmol/L (ref 98–111)
Creatinine, Ser: 10.52 mg/dL — ABNORMAL HIGH (ref 0.44–1.00)
GFR, Estimated: 4 mL/min — ABNORMAL LOW (ref >60)
Glucose, Bld: 83 mg/dL (ref 70–99)
Phosphorus: 6.2 mg/dL — ABNORMAL HIGH (ref 2.5–4.6)
Potassium: 4.8 mmol/L (ref 3.5–5.1)
Sodium: 136 mmol/L (ref 135–145)

## 2021-10-31 LAB — GLUCOSE, CAPILLARY
Glucose-Capillary: 84 mg/dL (ref 70–99)
Glucose-Capillary: 96 mg/dL (ref 70–99)
Glucose-Capillary: 136 mg/dL — ABNORMAL HIGH (ref 70–99)
Glucose-Capillary: 129 mg/dL — ABNORMAL HIGH (ref 70–99)

## 2021-10-31 LAB — PREPARE RBC (CROSSMATCH)

## 2021-10-31 MED ORDER — ALBUMIN HUMAN 25 % IV SOLN
INTRAVENOUS | Status: AC
  Administered 2021-10-31: 25 g via INTRAVENOUS
  Filled 2021-10-31: qty 100

## 2021-10-31 MED ORDER — SODIUM CHLORIDE 0.9% IV SOLUTION: Freq: Once | INTRAVENOUS | Status: DC

## 2021-10-31 MED ORDER — ALBUMIN HUMAN 25 % IV SOLN: 25.0000 g | Freq: Once | INTRAVENOUS | Status: AC

## 2021-10-31 NOTE — Progress Notes (Signed)
[facility] Progress Note   56 y.o. female with a past medical history significant for DM2, HTN, COPD, admitted for patellar fracture.       Assessment/ Plan:   1) Patellar fracture: In immobilizer.  Management per primary team   2) ESRD: previously at [facility] now [facility] MWF schedule for now  Last HD Wed w/ 2.5L net UF with no cramping.  Seen on  HD today through [facility] access 4K bath -> change to 3K Fri 149/57 2.5L net UF goal  Next week back to MWF  3) Hypertension/volume: Blood pressure fairly high but overall improved.  Volume removal with dialysis.  Currently on amlodipine 10 mg daily, losartan 50mg  daily and hydralazine 100 mg 3 times daily. Possibly may be able to titrate down on hydralazine as we UF her on HD.   4) Anemia secondary to ESRD: Hgb 7.3, may need transfusion soon. Iron sat 7 and ferritin 742.  IV iron ordered; will hold 2 doses if transfusion is required.  Aranesp 140mcg given on Monday 1/2. Next dose on Monday.   5) secondary hyperparathyroidism: Continue PhosLo.  PTH 429. Phos 1/5 5.3.   6) Diabetes Mellitus Type 2: Continue management per primary team   7) Gangrenous right toe: Related to peripheral arterial disease.  Vascular surgery following -> s/p  1/6 rt ilio/fem endarterectomy and  rt GT amputation w/ good capillary bleeding but unable to intervene on rt SFA/pop lesion.  Subjective:   States pain better after pain meds. Denies f/c/n/v.   Objective:   BP (!) 158/56 (BP Location: Right Arm)    Pulse 71    Temp 98.6 F (37 C) (Oral)    Resp 11    Ht 5\' 1"  (1.549 m)    Wt 54 kg    SpO2 100%    BMI 22.49 kg/m   Intake/Output Summary (Last 24 hours) at 10/31/2021 1937 Last data filed at 10/30/2021 1523 Gross per 24 hour  Intake 1060 ml  Output 150 ml  Net 910 ml   Weight change:   Physical Exam: Constitutional: chronically ill appearing, NAD ENMT: ears and nose without scars or lesions CV: normal rate, no  edema Respiratory: Bilateral chest rise, not tachypneic Gastrointestinal: SNDNT + BS Skin: s/p amputation of right toe, otherwise no visible lesions or rashes Psych: alert, judgement/insight appropriate Access: Lt BBT good thrill at inflow, needles in place    Imaging: VAS [place]  Result Date: 10/29/2021 [place] MAPPING Patient Name:  Tammy Moses  Date of Exam:   10/29/2021 Medical Rec #: [id number]       Accession #:    [id number] Date of Birth: [birth year]       Patient Gender: F Patient Age:   84 years Exam Location:  [facility] Procedure:      VAS [place] LOWER EXTREMITY SAPHENOUS VEIN MAPPING Referring Phys: Harold Barban --------------------------------------------------------------------------------  Indications: Blue toe syndrome  Comparison Study: no prior Performing Technologist: Archie Patten RVS  Examination Guidelines: A complete evaluation includes B-mode imaging, spectral Doppler, color Doppler, and power Doppler as needed of all accessible portions of each vessel. Bilateral testing is considered an integral part of a complete examination. Limited examinations for reoccurring indications may be performed as noted. +---------------+-----------+----------------------+---------------+-----------+    RT Diameter   RT Findings          GSV             LT Diameter  LT Findings        (cm)                                               (cm)                    +---------------+-----------+----------------------+---------------+-----------+       0.43                       Saphenofemoral          0.46                                                        Junction                                     +---------------+-----------+----------------------+---------------+-----------+       0.36                       Proximal thigh          0.33                    +---------------+-----------+----------------------+---------------+-----------+       0.36                          Mid thigh             0.32                    +---------------+-----------+----------------------+---------------+-----------+       0.36                        Distal thigh           0.36                    +---------------+-----------+----------------------+---------------+-----------+       0.41                            Knee               0.30                    +---------------+-----------+----------------------+---------------+-----------+       0.25                         Prox calf             0.30                    +---------------+-----------+----------------------+---------------+-----------+       0.26                          Mid calf             0.29                    +---------------+-----------+----------------------+---------------+-----------+  0.24                        Distal calf            0.22                    +---------------+-----------+----------------------+---------------+-----------+       0.32                           Ankle               0.22                    +---------------+-----------+----------------------+---------------+-----------+ Diagnosing physician: Jamelle Haring Electronically signed by Jamelle Haring on 10/29/2021 at 4:02:05 PM.    Final     Labs: BMET Recent Labs  Lab 10/25/21 0200 10/27/21 0539 10/27/21 1453 10/28/21 0832 10/29/21 0252 10/30/21 0453  NA 134* 134*  --  133* 132* 135  K 4.1 3.8  --  4.0 4.0 4.1  CL 94* 96*  --  95* 98 96*  CO2 27 26  --  26 26 26   GLUCOSE 133* 112*  --  110* 111* 109*  BUN 37* 21*  --  30* 18 29*  CREATININE 9.08* 6.47*  --  8.57* 6.35* 8.89*  CALCIUM 8.7* 9.5  --  9.8 9.5 9.9  PHOS  --   --  4.4 5.3*  --   --    CBC Recent Labs  Lab 10/27/21 0627 10/28/21 0430 10/29/21 0252 10/30/21 0453  WBC 11.9* 11.5* 12.7* 12.5*  NEUTROABS  --  6.9 8.0*  --   HGB 7.6* 7.9* 8.3* 8.4*  HCT 25.3* 25.6* 27.3* 28.0*  MCV 91.0 92.1 92.9 94.0  PLT 359 356 362 390    Medications:      amLODipine  10 mg Oral Daily   atropine  1 drop Both Eyes BID   buPROPion  150 mg Oral Daily   calcium acetate  2,001 mg Oral TID WC   Chlorhexidine Gluconate Cloth  6 each Topical Q0600   darbepoetin (ARANESP) injection - DIALYSIS  100 mcg Intravenous Q Mon-HD   dorzolamide-timolol  1 drop Both Eyes BID   feeding supplement (NEPRO CARB STEADY)  237 mL Oral BID BM   gabapentin  300 mg Oral q1800   heparin  5,000 Units Subcutaneous Q12H   hydrALAZINE  100 mg Oral Q8H   insulin aspart  0-6 Units Subcutaneous TID WC   labetalol  200 mg Oral BID   lamoTRIgine  25 mg Oral Daily   latanoprost  1 drop Both Eyes QHS   losartan  75 mg Oral Daily   multivitamin  1 tablet Oral QHS   pravastatin  80 mg Oral Daily   sodium chloride flush  3 mL Intravenous Q12H      Otelia Santee, MD 10/31/2021, 8:52 AM

## 2021-10-31 NOTE — Progress Notes (Signed)
°   10/31/21 1245  Vitals  Temp 97.9 F (36.6 C)  Temp Source Oral  BP (!) 186/63  BP Location Right Arm  BP Method Automatic  Patient Position (if appropriate) Lying  Pulse Rate 71  Pulse Rate Source Monitor  Resp 16  Oxygen Therapy  SpO2 100 %  O2 Device Room Air  Dialysis Weight  Weight 52.9 kg  Type of Weight Post-Dialysis  Post-Hemodialysis Assessment  Rinseback Volume (mL) 250 mL  KECN 236 V  Dialyzer Clearance Lightly streaked  Duration of HD Treatment -hour(s) 3.75 hour(s)  Hemodialysis Intake (mL) 1700 mL  UF Total -Machine (mL) 1434 mL  Net UF (mL) -266 mL  Tolerated HD Treatment No (Comment)  Post-Hemodialysis Comments tx complete-pst able  AVG/AVF Arterial Site Held (minutes) 10 minutes  AVG/AVF Venous Site Held (minutes) 10 minutes  Fistula / Graft Left Upper arm  Placement Date/Time: 07/12/17 (c) 1900   Placed prior to admission: Yes  Orientation: Left  Access Location: Upper arm  Site Condition No complications  Fistula / Graft Assessment Present;Thrill;Bruit  Status Deaccessed   HD tx complete-pt noted to have SBP-70-80s and DBP-40s-50s midway through tx. UF stopped during this time, Dr. Augustin Coupe notified with new orders given, several additional NS boluses given with total being 91ml. Per Dr. Augustin Coupe keep pt even. Albumin also given x1 with positive effects noted. 1 unit PRBC given towards end of tx. SBP now running in 180s, primary nurse notified in report. Pt is due morning antihypertensive meds upon return to unit. Pt stable upon transfer.

## 2021-10-31 NOTE — Progress Notes (Signed)
Lab notified RN of HEM 6.7. MD aware

## 2021-10-31 NOTE — TOC Progression Note (Addendum)
Transition of Care [facility]) - Progression Note    Patient Details  Name: Tammy Moses MRN: [id number] Date of Birth: [birth year]  Transition of Care [facility]) CM/SW [place], [place] Phone Number: 10/31/2021, 11:37 AM  Clinical Narrative:     TOC continues to follow   Expected Discharge Plan: [place] Barriers to Discharge: Continued Medical Work up, SNF Pending bed offer  Expected Discharge Plan and Services Expected Discharge Plan: [place] Choice: [place] Living arrangements for the past 2 months: [place], Apartment                   DME Agency: [employer]       HH Arranged: PT [place] Agency: [place] ([facility])         Social Determinants of Health (SDOH) Interventions    Readmission Risk Interventions Readmission Risk Prevention Plan 10/09/2021  Transportation Screening Complete  HRI or Home Care Consult Complete  SW Recovery Care/Counseling Consult Complete  Palliative Care Screening Not Applicable  Skilled Nursing Facility Complete  Some recent data might be hidden

## 2021-10-31 NOTE — Anesthesia Postprocedure Evaluation (Signed)
Anesthesia Post Note  Patient: Tammy Moses  Procedure(s) Performed: RIGHT FEMORAL ENDARTERECTOMY WITH ANGIOPLASTY OF TIBIAL ARTERY (Right: Leg Lower) RIGHT GREAT TOE AMPUTATION (Right: Foot)     Patient location during evaluation: PACU Anesthesia Type: General Level of consciousness: awake and alert Pain management: pain level controlled Vital Signs Assessment: post-procedure vital signs reviewed and stable Respiratory status: spontaneous breathing, nonlabored ventilation, respiratory function stable and patient connected to nasal cannula oxygen Cardiovascular status: blood pressure returned to baseline and stable Postop Assessment: no apparent nausea or vomiting Anesthetic complications: no   No notable events documented.  Last Vitals:  Vitals:   10/30/21 2328 10/31/21 0404  BP: (!) 120/41 (!) 130/49  Pulse: 70 76  Resp: 16 18  Temp:  36.5 C  SpO2: 100% 99%    Last Pain:  Vitals:   10/31/21 0515  TempSrc:   PainSc: [place]

## 2021-10-31 NOTE — Progress Notes (Signed)
PROGRESS NOTE  Tammy Moses HDQ:[id number] DOB: Mar 16, 1966 DOA: 10/21/2021 PCP: [name], FNP  Brief History   Patient is a 56 year old female with history of ESRD on dialysis MWF, hypertension, normocytic anemia, COPD, blindness, tobacco use who presented with after falling down on her right knee when she slipped on ice.  She was unable to stand up and complained of pain and swelling of right knee and was brought to the emergency department.  Imaging showed nondisplaced right patella fracture on x-ray.  Orthopedics was consulted and recommended right knee brace with PT and follow-up as an outpatient.  Patient was also found to have right toe gangrene, ABI showed severe peripheral artery disease.  Vascular surgery following, underwent  left lower extremity arteriography on 1/3 with finding of severe infrainguinal occlusive disease bilaterally.   PT/OT recommending SNF on DC,TOC consulted.  The patient underwent s/p R iliofemoral endarterectomy with toe amp with unsuccessful revascularization of SFA/pop lesion.  Since returning from surgery the patient has been at times confused and somnolent. She pulled out her peripheral IV's and has continued to refuse telemetry monitoring. For me this morning she remains somewhat confused alternatively telling me that she had a procedure on her thumb and her toe. She also will tell me that she is at home, and then agrees that she is in the hospital. She then tells me that her cousin came to the hospital to say that she needed psychiatric help.  She received 1 unit PRBC's in HD this morning due to Hgb of 6.7.  Consultants  Vascular surgery Cardiology  Procedures  R iliofemoral endarterectomy with toe amp with unsuccessful revascularization of SFA/pop lesion  Antibiotics   Anti-infectives (From admission, onward)    Start     Dose/Rate Route Frequency Ordered Stop   10/30/21 0600  ceFAZolin (ANCEF) IVPB 1 g/50 mL premix       Note to Pharmacy:  Send with pt to OR   1 g 100 mL/hr over 30 Minutes Intravenous To Short Stay 10/29/21 0903 10/30/21 1102      Subjective  The patient is resting comfortably. No new complaints.   Objective   Vitals:  Vitals:   10/31/21 1552 10/31/21 1706  BP:  (!) 168/51  Pulse:  75  Resp: 14 16  Temp:    SpO2:  100%    Exam:  Constitutional:  The patient is awake, alert, and somewhat confused. No acute distress. Respiratory:  No increased work of breathing. No wheezes, rales, or rhonchi No tactile fremitus Cardiovascular:  Regular rate and rhythm No murmurs, ectopy, or gallups. No lateral PMI. No thrills. Abdomen:  Abdomen is soft, non-tender, non-distended No hernias, masses, or organomegaly Normoactive bowel sounds.  Musculoskeletal:  No cyanosis, clubbing, or edema Right foot is bandaged. Skin:  No rashes, lesions, ulcers palpation of skin: no induration or nodules Neurologic:  CN 2-12 intact Sensation all 4 extremities intact Psychiatric:  Mental status Mood, affect appropriate Orientation to person, place, time  judgment and insight appear intact   I have personally reviewed the following:   Today's Data  Vitals  Lab Data  CBC, BMP  Imaging    Cardiology Data  Echocardiogram  Other Data    Scheduled Meds:  sodium chloride   Intravenous Once   sodium chloride   Intravenous Once   amLODipine  10 mg Oral Daily   atropine  1 drop Both Eyes BID   buPROPion  150 mg Oral Daily   calcium acetate  2,001 mg Oral TID WC   Chlorhexidine Gluconate Cloth  6 each Topical Q0600   darbepoetin (ARANESP) injection - DIALYSIS  100 mcg Intravenous Q Mon-HD   dorzolamide-timolol  1 drop Both Eyes BID   feeding supplement (NEPRO CARB STEADY)  237 mL Oral BID BM   gabapentin  300 mg Oral q1800   heparin  5,000 Units Subcutaneous Q12H   hydrALAZINE  100 mg Oral Q8H   insulin aspart  0-6 Units Subcutaneous TID WC   labetalol  200 mg Oral BID   lamoTRIgine  25 mg Oral  Daily   latanoprost  1 drop Both Eyes QHS   losartan  75 mg Oral Daily   multivitamin  1 tablet Oral QHS   pravastatin  80 mg Oral Daily   sodium chloride flush  3 mL Intravenous Q12H   Continuous Infusions:  sodium chloride      Principal Problem:   Closed patellar sleeve fracture of right knee Active Problems:   Patellar sleeve fracture of right knee, closed, initial encounter   Gangrene of foot (HCC)   LOS: 8 days    A & P  Right knee patella nondisplaced fracture: Slipped on ice and fell on right knee.  Orthopedics consulted, recommended knee immobilizer, follow-up as an outpatient with orthopedics.  Currently on knee mobilizer.  PT/OT consulted recommended skilled NF on discharge.   Right big toe gangrene: ABI showed severe peripheral artery disease.  CT angiogram showed severe atherosclerotic disease throughout the abdomen, pelvis and bilateral lower extremities,severe bilateral outflow disease .Vascular surgery following and underwent  left lower extremity arteriography on 1/3 with finding of severe infrainguinal occlusive disease bilaterally.    Cardiology consulted for clearance for surgery. Echocardiogram was performed on 10/28/2020.  It has demonstrated EF of 60-65%, Normal LV function, and no regional wall motion abnormalities. LV cavity is mildly dilated. There is grade II diastolic dysfunction.   Pt underwent R iliofemoral endarterectomy with toe amp with unsuccessful revascularization of SFA/pop lesion with [name] on 10/30/2020.   Anemia: Multifactorial: Chronic anemia related to ESRD/post operative anemia. The patient's hemoglobin was 6.7 this morning. She received 1 unit in transfusion during HD this morning. Hgb 8.4 post transfusion.  ESRD on dialysis: Dialyzed on Monday, Wednesday, Friday. nephrology following.   Diabetes type 2: Currently on sliding insulin scale.  Monitor blood sugars. FSBS 89-142 in the last 24 hours.   Anemia of chronic disease: Hemoglobin  is stable at 8.3 today. Iron panel showed severe iron deficiency, given IV iron.  Also aranesp.  Monitor CBC.  Will transfuse if hemoglobin drops less than 7.   Hypertension: Monitor blood pressure.  Continue amlodipine, hydralazine, labetalol, losartan. BP remains elevated. Pt is due for HD today.  Altered mental status: The patient is somewhat confused. This was present to some degree prior to the procedure, but was worse afterwards, probably due to anesthesia. She is refusing telemetry and will pull out her IV. She has a Actuary.   Debility/deconditioning: PT/OT recommending skilled nursing facility on discharge.  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  Social worker following.   I have seen and examined this patient myself. I have spent 34 minutes in her evaluation and care.   DVT prophylaxis:Hep [place] Code Status: Full Family Communication:: None available Patient status:Inpatient   Dispo: The patient is from: Home              Anticipated d/c is to:SNF  Anticipated d/c date is: Awaiting vascular surgery procedure  Rejina Odle, DO [facility] Direct contact: see [email or web address]  7PM-7AM contact night coverage as above 10/31/2021, 5:55 PM  LOS: 6 days

## 2021-10-31 NOTE — Progress Notes (Addendum)
°  Progress Note    10/31/2021 9:33 AM 1 Day Post-Op  Subjective:  R toe amp site pain   Vitals:   10/31/21 0846 10/31/21 0900  BP: (!) 149/57 (!) 156/57  Pulse: 70 68  Resp:    Temp:    SpO2:     Physical Exam: Lungs:  non labored Incisions:  R groin incision c/d/i Extremities:  peroneal signal by doppler; dressing left in place R GT amp site Neurologic: A&O  CBC    Component Value Date/Time   WBC 15.7 (H) 10/31/2021 0830   RBC 2.28 (L) 10/31/2021 0830   HGB 6.7 (LL) 10/31/2021 0830   HGB 12.4 12/13/2017 1416   HCT 21.7 (L) 10/31/2021 0830   HCT 37.8 12/13/2017 1416   PLT 346 10/31/2021 0830   PLT 254 12/13/2017 1416   MCV 95.2 10/31/2021 0830   MCV 83 12/13/2017 1416   MCH 29.4 10/31/2021 0830   MCHC 30.9 10/31/2021 0830   RDW 18.8 (H) 10/31/2021 0830   RDW 15.8 (H) 12/13/2017 1416   LYMPHSABS 2.5 10/29/2021 0252   LYMPHSABS 4.1 (H) 12/13/2017 1416   MONOABS 1.7 (H) 10/29/2021 0252   EOSABS 0.4 10/29/2021 0252   EOSABS 0.3 12/13/2017 1416   BASOSABS 0.1 10/29/2021 0252   BASOSABS 0.1 12/13/2017 1416    BMET    Component Value Date/Time   NA 135 10/30/2021 0453   NA 146 (H) 05/10/2017 0950   K 4.1 10/30/2021 0453   CL 96 (L) 10/30/2021 0453   CO2 26 10/30/2021 0453   GLUCOSE 109 (H) 10/30/2021 0453   BUN 29 (H) 10/30/2021 0453   BUN 38 (H) 05/10/2017 0950   CREATININE 8.89 (H) 10/30/2021 0453   CREATININE 3.40 (H) 03/11/2017 1505   CALCIUM 9.9 10/30/2021 0453   GFRNONAA 5 (L) 10/30/2021 0453   GFRNONAA 15 (L) 03/11/2017 1505   GFRAA 5 (L) 05/05/2020 1314   GFRAA 17 (L) 03/11/2017 1505    INR    Component Value Date/Time   INR 1.2 10/21/2021 1425     Intake/Output Summary (Last 24 hours) at 10/31/2021 0933 Last data filed at 10/30/2021 1523 Gross per 24 hour  Intake 1060 ml  Output 150 ml  Net 910 ml     Assessment/Plan:  56 y.o. female is s/p R iliofemoral endarterectomy with toe amp; unsuccessful revascularization of SFA/pop lesion 1  Day Post-Op   Patient maintains a R peroneal signal by doppler Dressing change R GT amp tomorrow R heel weightbearing only; darco shoe ordered Patient is at high risk for not being able to heal toe amp and requiring R leg amputation   Dagoberto Ligas, PA-C [facility] [phone] 10/31/2021 9:33 AM  VASCULAR STAFF ADDENDUM: I have independently interviewed and examined the patient. I agree with the above.    Cassandria Santee, MD [facility] Phone Number: [phone] 10/31/2021 10:44 AM

## 2021-11-01 DIAGNOSIS — R404 Transient alteration of awareness: Secondary | ICD-10-CM

## 2021-11-01 DIAGNOSIS — S82091D Other fracture of right patella, subsequent encounter for closed fracture with routine healing: Secondary | ICD-10-CM

## 2021-11-01 LAB — BPAM RBC
Blood Product Expiration Date: 202301212359
ISSUE DATE / TIME: 202301071150
Unit Type and Rh: 7300

## 2021-11-01 LAB — CBC WITH DIFFERENTIAL/PLATELET
Abs Immature Granulocytes: 0.09 10*3/uL — ABNORMAL HIGH (ref 0.00–0.07)
Basophils Absolute: 0.1 10*3/uL (ref 0.0–0.1)
Basophils Relative: 1 %
Eosinophils Absolute: 0.3 10*3/uL (ref 0.0–0.5)
Eosinophils Relative: 2 %
HCT: 27.5 % — ABNORMAL LOW (ref 36.0–46.0)
Hemoglobin: 8.7 g/dL — ABNORMAL LOW (ref 12.0–15.0)
Immature Granulocytes: 1 %
Lymphocytes Relative: 17 %
Lymphs Abs: 2.5 10*3/uL (ref 0.7–4.0)
MCH: 28.7 pg (ref 26.0–34.0)
MCHC: 31.6 g/dL (ref 30.0–36.0)
MCV: 90.8 fL (ref 80.0–100.0)
Monocytes Absolute: 2.3 10*3/uL — ABNORMAL HIGH (ref 0.1–1.0)
Monocytes Relative: 15 %
Neutro Abs: 9.7 10*3/uL — ABNORMAL HIGH (ref 1.7–7.7)
Neutrophils Relative %: 64 %
Platelets: 314 10*3/uL (ref 150–400)
RBC: 3.03 MIL/uL — ABNORMAL LOW (ref 3.87–5.11)
RDW: 18.4 % — ABNORMAL HIGH (ref 11.5–15.5)
WBC: 14.9 10*3/uL — ABNORMAL HIGH (ref 4.0–10.5)
nRBC: 0 % (ref 0.0–0.2)

## 2021-11-01 LAB — TYPE AND SCREEN
ABO/RH(D): B POS
Antibody Screen: NEGATIVE
Unit division: 0

## 2021-11-01 LAB — GLUCOSE, CAPILLARY
Glucose-Capillary: 103 mg/dL — ABNORMAL HIGH (ref 70–99)
Glucose-Capillary: 117 mg/dL — ABNORMAL HIGH (ref 70–99)
Glucose-Capillary: 115 mg/dL — ABNORMAL HIGH (ref 70–99)
Glucose-Capillary: 98 mg/dL (ref 70–99)

## 2021-11-01 LAB — HEMOGLOBIN AND HEMATOCRIT, BLOOD
HCT: 25.4 % — ABNORMAL LOW (ref 36.0–46.0)
Hemoglobin: 8.6 g/dL — ABNORMAL LOW (ref 12.0–15.0)

## 2021-11-01 MED ORDER — ATORVASTATIN CALCIUM 80 MG PO TABS
80.0000 mg | ORAL_TABLET | Freq: Every day | ORAL | Status: DC
  Administered 2021-11-01 – 2021-11-06 (×6): 80 mg via ORAL
  Filled 2021-11-01 (×6): qty 1

## 2021-11-01 MED ORDER — CHLORHEXIDINE GLUCONATE CLOTH 2 % EX PADS: 6.0000 | MEDICATED_PAD | Freq: Every day | CUTANEOUS | Status: DC

## 2021-11-01 NOTE — Progress Notes (Signed)
PROGRESS NOTE  Tammy Moses QQP:[id number] DOB: [birth year] DOA: 10/21/2021 PCP: [name], FNP  Brief History   Patient is a 56 year old female with history of ESRD on dialysis MWF, hypertension, normocytic anemia, COPD, blindness, tobacco use who presented with after falling down on her right knee when she slipped on ice.  She was unable to stand up and complained of pain and swelling of right knee and was brought to the emergency department.  Imaging showed nondisplaced right patella fracture on x-ray.  Orthopedics was consulted and recommended right knee brace with PT and follow-up as an outpatient.  Patient was also found to have right toe gangrene, ABI showed severe peripheral artery disease.  Vascular surgery following, underwent  left lower extremity arteriography on 1/3 with finding of severe infrainguinal occlusive disease bilaterally.   PT/OT recommending SNF on DC,TOC consulted.  The patient underwent s/p R iliofemoral endarterectomy with toe amp with unsuccessful revascularization of SFA/pop lesion.  Since returning from surgery the patient has been at times confused and somnolent. She pulled out her peripheral IV's and has continued to refuse telemetry monitoring. For me this morning she remains somewhat confused alternatively telling me that she had a procedure on her thumb and her toe. She also will tell me that she is at home, and then agrees that she is in the hospital. She then tells me that her cousin came to the hospital to say that she needed psychiatric help.  She received 1 unit PRBC's in HD this morning due to Hgb of 6.7.  Consultants  Vascular surgery Cardiology  Procedures  R iliofemoral endarterectomy with toe amp with unsuccessful revascularization of SFA/pop lesion  Antibiotics   Anti-infectives (From admission, onward)    Start     Dose/Rate Route Frequency Ordered Stop   10/30/21 0600  ceFAZolin (ANCEF) IVPB 1 g/50 mL premix       Note to Pharmacy:  Send with pt to OR   1 g 100 mL/hr over 30 Minutes Intravenous To Short Stay 10/29/21 0903 10/30/21 1102      Subjective  The patient is resting comfortably. No new complaints.   Objective   Vitals:  Vitals:   11/01/21 1101 11/01/21 1513  BP: (!) 138/46 (!) 129/48  Pulse: 69 67  Resp: 16 17  Temp: 98.2 F (36.8 C) 98.1 F (36.7 C)  SpO2: 96% 98%    Exam:  Constitutional:  The patient is awake, alert, and somewhat confused. No acute distress. Respiratory:  No increased work of breathing. No wheezes, rales, or rhonchi No tactile fremitus Cardiovascular:  Regular rate and rhythm No murmurs, ectopy, or gallups. No lateral PMI. No thrills. Abdomen:  Abdomen is soft, non-tender, non-distended No hernias, masses, or organomegaly Normoactive bowel sounds.  Musculoskeletal:  No cyanosis, clubbing, or edema Right foot is bandaged. Skin:  No rashes, lesions, ulcers palpation of skin: no induration or nodules Neurologic:  CN 2-12 intact Sensation all 4 extremities intact Psychiatric:  Mental status Mood, affect appropriate Orientation to person, place, time  judgment and insight appear intact   I have personally reviewed the following:   Today's Data  Vitals  Lab Data  CBC, BMP  Imaging    Cardiology Data  Echocardiogram  Other Data    Scheduled Meds:  sodium chloride   Intravenous Once   sodium chloride   Intravenous Once   amLODipine  10 mg Oral Daily   atorvastatin  80 mg Oral Daily   atropine  1 drop Both  Eyes BID   buPROPion  150 mg Oral Daily   calcium acetate  2,001 mg Oral TID WC   Chlorhexidine Gluconate Cloth  6 each Topical Q0600   darbepoetin (ARANESP) injection - DIALYSIS  100 mcg Intravenous Q Mon-HD   dorzolamide-timolol  1 drop Both Eyes BID   feeding supplement (NEPRO CARB STEADY)  237 mL Oral BID BM   gabapentin  300 mg Oral q1800   heparin  5,000 Units Subcutaneous Q12H   hydrALAZINE  100 mg Oral Q8H   insulin aspart  0-6  Units Subcutaneous TID WC   labetalol  200 mg Oral BID   lamoTRIgine  25 mg Oral Daily   latanoprost  1 drop Both Eyes QHS   losartan  75 mg Oral Daily   multivitamin  1 tablet Oral QHS   pravastatin  80 mg Oral Daily   sodium chloride flush  3 mL Intravenous Q12H   Continuous Infusions:  sodium chloride      Principal Problem:   Closed patellar sleeve fracture of right knee Active Problems:   Patellar sleeve fracture of right knee, closed, initial encounter   Gangrene of foot (HCC)   LOS: 9 days    A & P  Right knee patella nondisplaced fracture: Slipped on ice and fell on right knee.  Orthopedics consulted, recommended knee immobilizer, follow-up as an outpatient with orthopedics.  Currently on knee mobilizer.  PT/OT consulted recommended skilled NF on discharge.   Right big toe gangrene: ABI showed severe peripheral artery disease.  CT angiogram showed severe atherosclerotic disease throughout the abdomen, pelvis and bilateral lower extremities,severe bilateral outflow disease .Vascular surgery following and underwent  left lower extremity arteriography on 1/3 with finding of severe infrainguinal occlusive disease bilaterally.    Cardiology consulted for clearance for surgery. Echocardiogram was performed on 10/28/2020.  It has demonstrated EF of 60-65%, Normal LV function, and no regional wall motion abnormalities. LV cavity is mildly dilated. There is grade II diastolic dysfunction.   Pt underwent R iliofemoral endarterectomy with toe amp with unsuccessful revascularization of SFA/pop lesion with [name] on 10/30/2020. She has tolerated the procedure well.  Anemia: Multifactorial: Chronic anemia related to ESRD/post operative anemia. The patient's hemoglobin was 6.7 this morning. She received 1 unit in transfusion during HD this morning. Hgb 8.7 this morning.   ESRD on dialysis: Dialyzed on Monday, Wednesday, Friday. nephrology following.   Diabetes type 2: Currently on  sliding insulin scale.  Monitor blood sugars.    Anemia of chronic disease: Hemoglobin is stable at 8.7 today. Iron panel showed severe iron deficiency, given IV iron.  Also aranesp.  Monitor CBC.  Will transfuse if hemoglobin drops less than 7.   Hypertension: Monitor blood pressure.  Continue amlodipine, hydralazine, labetalol, losartan. BP remains elevated. Pt is due for HD today.  Altered mental status: The patient is a little more clear today. She is still not completely oriented.   Debility/deconditioning: PT/OT recommending skilled nursing facility on discharge.  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  Social worker following.   I have seen and examined this patient myself. I have spent 34 minutes in her evaluation and care.   DVT prophylaxis:Hep [place] Code Status: Full Family Communication:: None available Patient status:Inpatient   Dispo: The patient is from: Home              Anticipated d/c is to:SNF               Anticipated d/c  date is: Awaiting vascular surgery procedure  Myrtle Barnhard, DO [facility] Direct contact: see [email or web address]  7PM-7AM contact night coverage as above 11/01/2021, 5:28 PM  LOS: 6 days

## 2021-11-01 NOTE — Progress Notes (Addendum)
°  Progress Note    11/01/2021 8:36 AM 2 Days Post-Op  Subjective:  no new complaints   Vitals:   11/01/21 0425 11/01/21 0750  BP: (!) 144/52 (!) 122/48  Pulse: 75 68  Resp: 13 16  Temp: 98.2 F (36.8 C) 98 F (36.7 C)  SpO2: 99% 97%   Physical Exam: Lungs:  non labored Incisions:  R groin incision c/d/I; R GT amp site without purulence or drainage; darkening skin edges Extremities:  R peroneal by doppler Neurologic: A&O     CBC    Component Value Date/Time   WBC 14.9 (H) 11/01/2021 0159   RBC 3.03 (L) 11/01/2021 0159   HGB 8.7 (L) 11/01/2021 0159   HGB 12.4 12/13/2017 1416   HCT 27.5 (L) 11/01/2021 0159   HCT 37.8 12/13/2017 1416   PLT 314 11/01/2021 0159   PLT 254 12/13/2017 1416   MCV 90.8 11/01/2021 0159   MCV 83 12/13/2017 1416   MCH 28.7 11/01/2021 0159   MCHC 31.6 11/01/2021 0159   RDW 18.4 (H) 11/01/2021 0159   RDW 15.8 (H) 12/13/2017 1416   LYMPHSABS 2.5 11/01/2021 0159   LYMPHSABS 4.1 (H) 12/13/2017 1416   MONOABS 2.3 (H) 11/01/2021 0159   EOSABS 0.3 11/01/2021 0159   EOSABS 0.3 12/13/2017 1416   BASOSABS 0.1 11/01/2021 0159   BASOSABS 0.1 12/13/2017 1416    BMET    Component Value Date/Time   NA 136 10/31/2021 0830   NA 146 (H) 05/10/2017 0950   K 4.8 10/31/2021 0830   CL 99 10/31/2021 0830   CO2 23 10/31/2021 0830   GLUCOSE 83 10/31/2021 0830   BUN 41 (H) 10/31/2021 0830   BUN 38 (H) 05/10/2017 0950   CREATININE 10.52 (H) 10/31/2021 0830   CREATININE 3.40 (H) 03/11/2017 1505   CALCIUM 9.7 10/31/2021 0830   GFRNONAA 4 (L) 10/31/2021 0830   GFRNONAA 15 (L) 03/11/2017 1505   GFRAA 5 (L) 05/05/2020 1314   GFRAA 17 (L) 03/11/2017 1505    INR    Component Value Date/Time   INR 1.2 10/21/2021 1425     Intake/Output Summary (Last 24 hours) at 11/01/2021 0836 Last data filed at 10/31/2021 2036 Gross per 24 hour  Intake 783 ml  Output -266 ml  Net 1049 ml     Assessment/Plan:  56 y.o. female is s/p R iliofemoral endarterectomy  with toe amp; unsuccessful revascularization of SFA/pop lesion 2 Days Post-Op   Peroneal by doppler above the ankle R GT amp site with some darkening skin edges; she is at high risk for amputation of R leg Continue dry dressing changes to R foot daily OOB with darco shoe Ok for SNF when bed available   Dagoberto Ligas, PA-C [facility] [phone] 11/01/2021 8:36 AM  VASCULAR STAFF ADDENDUM: I have independently interviewed and examined the patient. I agree with the above.    Cassandria Santee, MD [facility] Phone Number: [phone] 11/01/2021 8:48 AM

## 2021-11-01 NOTE — Progress Notes (Signed)
Orthopedic Tech Progress Note Patient Details:  Tammy Moses October 30, 1965 [id number]  Darco shoe is at bedside.  Ortho Devices Type of Ortho Device: Darco shoe Ortho Device/Splint Location: at bedside, for RLE Ortho Device/Splint Interventions: Ordered   Post Interventions Patient Tolerated: Fair, Well Instructions Provided: Adjustment of device, Care of device  Tashaun Obey [name] 11/01/2021, 6:11 PM

## 2021-11-01 NOTE — Progress Notes (Signed)
[facility] Progress Note   56 y.o. female with a past medical history significant for DM2, HTN, COPD, admitted for patellar fracture.       Assessment/ Plan:   1) Patellar fracture: In immobilizer.  Management per primary team   2) ESRD: previously at [facility] now [facility] MWF schedule for now  Last HD Wed w/ 2.5L net UF with no cramping and then Sat w/ 1470mL  net UF  Next HD  back to MWF regimen.  3) Hypertension/volume: Blood pressure fairly high but overall improved.  Volume removal with dialysis.  Currently on amlodipine 10 mg daily, losartan 50mg  daily and hydralazine 100 mg 3 times daily. Possibly may be able to titrate down on hydralazine as we UF her on HD.   4) Anemia secondary to ESRD: Hgb 7.3, may need transfusion soon. Iron sat 7 and ferritin 742.  IV iron ordered; will hold 2 doses if transfusion is required.  Aranesp 145mcg given on Monday 1/2. Next dose on Monday.   5) secondary hyperparathyroidism: Continue PhosLo.  PTH 429. Phos 1/5 5.3.   6) Diabetes Mellitus Type 2: Continue management per primary team   7) Gangrenous right toe: Related to peripheral arterial disease.  Vascular surgery following -> s/p  1/6 rt ilio/fem endarterectomy and  rt GT amputation w/ good capillary bleeding but unable to intervene on rt SFA/pop lesion.  Subjective:   States pain better after pain meds. Denies f/c/n/v.   Objective:   BP (!) 167/60    Pulse 68    Temp 98 F (36.7 C) (Oral)    Resp 16    Ht 5\' 1"  (1.549 m)    Wt 52.9 kg    SpO2 97%    BMI 22.04 kg/m   Intake/Output Summary (Last 24 hours) at 11/01/2021 1013 Last data filed at 10/31/2021 2036 Gross per 24 hour  Intake 783 ml  Output -266 ml  Net 1049 ml   Weight change:   Physical Exam: Constitutional: chronically ill appearing, NAD ENMT: ears and nose without scars or lesions CV: normal rate, no edema Respiratory: Bilateral chest rise, not tachypneic Gastrointestinal: SNDNT +  BS Skin: s/p amputation of right toe, discoloration around wound Psych: alert, judgement/insight appropriate Access: Lt BBT good thrill at inflow    Imaging: No results found.  Labs: BMET Recent Labs  Lab 10/27/21 0627 10/27/21 1453 10/28/21 0832 10/29/21 0252 10/30/21 0453 10/31/21 0830  NA 134*  --  133* 132* 135 136  K 3.8  --  4.0 4.0 4.1 4.8  CL 96*  --  95* 98 96* 99  CO2 26  --  26 26 26 23   GLUCOSE 112*  --  110* 111* 109* 83  BUN 21*  --  30* 18 29* 41*  CREATININE 6.47*  --  8.57* 6.35* 8.89* 10.52*  CALCIUM 9.5  --  9.8 9.5 9.9 9.7  PHOS  --  4.4 5.3*  --   --  6.2*   CBC Recent Labs  Lab 10/28/21 0430 10/29/21 0252 10/30/21 0453 10/31/21 0830 10/31/21 1432 11/01/21 0159  WBC 11.5* 12.7* 12.5* 15.7*  --  14.9*  NEUTROABS 6.9 8.0*  --   --   --  9.7*  HGB 7.9* 8.3* 8.4* 6.7* 8.6* 8.7*  HCT 25.6* 27.3* 28.0* 21.7* 25.4* 27.5*  MCV 92.1 92.9 94.0 95.2  --  90.8  PLT 356 362 390 346  --  314    Medications:  sodium chloride   Intravenous Once   sodium chloride   Intravenous Once   amLODipine  10 mg Oral Daily   atropine  1 drop Both Eyes BID   buPROPion  150 mg Oral Daily   calcium acetate  2,001 mg Oral TID WC   Chlorhexidine Gluconate Cloth  6 each Topical Q0600   darbepoetin (ARANESP) injection - DIALYSIS  100 mcg Intravenous Q Mon-HD   dorzolamide-timolol  1 drop Both Eyes BID   feeding supplement (NEPRO CARB STEADY)  237 mL Oral BID BM   gabapentin  300 mg Oral q1800   heparin  5,000 Units Subcutaneous Q12H   hydrALAZINE  100 mg Oral Q8H   insulin aspart  0-6 Units Subcutaneous TID WC   labetalol  200 mg Oral BID   lamoTRIgine  25 mg Oral Daily   latanoprost  1 drop Both Eyes QHS   losartan  75 mg Oral Daily   multivitamin  1 tablet Oral QHS   pravastatin  80 mg Oral Daily   sodium chloride flush  3 mL Intravenous Q12H      Otelia Santee, MD 11/01/2021, 10:13 AM

## 2021-11-02 ENCOUNTER — Encounter (HOSPITAL_COMMUNITY): Payer: Self-pay | Admitting: Surgery

## 2021-11-02 DIAGNOSIS — E118 Type 2 diabetes mellitus with unspecified complications: Secondary | ICD-10-CM

## 2021-11-02 LAB — BASIC METABOLIC PANEL
Anion gap: 13 (ref 5–15)
BUN: 34 mg/dL — ABNORMAL HIGH (ref 6–20)
CO2: 27 mmol/L (ref 22–32)
Calcium: 10.3 mg/dL (ref 8.9–10.3)
Chloride: 92 mmol/L — ABNORMAL LOW (ref 98–111)
Creatinine, Ser: 7.99 mg/dL — ABNORMAL HIGH (ref 0.44–1.00)
GFR, Estimated: 5 mL/min — ABNORMAL LOW (ref >60)
Glucose, Bld: 89 mg/dL (ref 70–99)
Potassium: 4.4 mmol/L (ref 3.5–5.1)
Sodium: 132 mmol/L — ABNORMAL LOW (ref 135–145)

## 2021-11-02 LAB — CBC
HCT: 27.3 % — ABNORMAL LOW (ref 36.0–46.0)
Hemoglobin: 8.4 g/dL — ABNORMAL LOW (ref 12.0–15.0)
MCH: 28.4 pg (ref 26.0–34.0)
MCHC: 30.8 g/dL (ref 30.0–36.0)
MCV: 92.2 fL (ref 80.0–100.0)
Platelets: 316 10*3/uL (ref 150–400)
RBC: 2.96 MIL/uL — ABNORMAL LOW (ref 3.87–5.11)
RDW: 18.1 % — ABNORMAL HIGH (ref 11.5–15.5)
WBC: 13.6 10*3/uL — ABNORMAL HIGH (ref 4.0–10.5)
nRBC: 0 % (ref 0.0–0.2)

## 2021-11-02 LAB — GLUCOSE, CAPILLARY
Glucose-Capillary: 87 mg/dL (ref 70–99)
Glucose-Capillary: 107 mg/dL — ABNORMAL HIGH (ref 70–99)
Glucose-Capillary: 115 mg/dL — ABNORMAL HIGH (ref 70–99)
Glucose-Capillary: 104 mg/dL — ABNORMAL HIGH (ref 70–99)

## 2021-11-02 NOTE — Progress Notes (Signed)
[facility] Progress Note     Assessment/ Plan:   1) Patellar fracture: In immobilizer.  Management per primary team   2) ESRD: previously at [facility] [place] MWF schedule for now, HD today 11/02/21  3) Hypertension/volume: Blood pressure fairly high but overall improved.  Volume removal with dialysis.  Currently on amlodipine 10 mg daily, losartan 50mg  daily and hydralazine 100 mg 3 times daily. Possibly may be able to titrate down on hydralazine as we UF her on HD.   4) Anemia secondary to ESRD: Hgb 7.3, may need transfusion soon. Iron sat 7 and ferritin 742.  IV iron ordered; will hold 2 doses if transfusion is required.  Aranesp 13mcg given on Monday 1/2. Next dose on Monday 11/02/21.   5) secondary hyperparathyroidism: Continue PhosLo.  PTH 429. Phos 1/5 5.3.   6) Diabetes Mellitus Type 2: Continue management per primary team   7) Gangrenous right toe: Related to peripheral arterial disease.  Vascular surgery following -> s/p  1/6 rt ilio/fem endarterectomy and  rt GT amputation w/ good capillary bleeding but unable to intervene on rt SFA/pop lesion.  Subjective:    Per RN staffing, confused at times.  Sitting on dialysis, quiet.  Seen on HD and procedure was supervised.  BFR 400 mL/min via AVF UF goal 2.5L.     Objective:   BP (!) 166/63 (BP Location: Right Arm)    Pulse 79    Temp 97.6 F (36.4 C) (Oral)    Resp 12    Ht 5\' 1"  (1.549 m)    Wt 54.4 kg    SpO2 97%    BMI 22.66 kg/m   Intake/Output Summary (Last 24 hours) at 11/02/2021 1358 Last data filed at 11/02/2021 0804 Gross per 24 hour  Intake 126 ml  Output --  Net 126 ml   Weight change:   Physical Exam: Constitutional: chronically ill appearing, NAD HEENT: R eye clouded CV: normal rate, no edema Respiratory: clear bilaterally Gastrointestinal: SNDNT + BS Skin: s/p amputation of right toe, dressed Access: Lt BBT     Imaging: No results found.  Labs: BMET Recent Labs  Lab  10/27/21 0627 10/27/21 1453 10/28/21 0832 10/29/21 0252 10/30/21 0453 10/31/21 0830 11/02/21 0222  NA 134*  --  133* 132* 135 136 132*  K 3.8  --  4.0 4.0 4.1 4.8 4.4  CL 96*  --  95* 98 96* 99 92*  CO2 26  --  26 26 26 23 27   GLUCOSE 112*  --  110* 111* 109* 83 89  BUN 21*  --  30* 18 29* 41* 34*  CREATININE 6.47*  --  8.57* 6.35* 8.89* 10.52* 7.99*  CALCIUM 9.5  --  9.8 9.5 9.9 9.7 10.3  PHOS  --  4.4 5.3*  --   --  6.2*  --    CBC Recent Labs  Lab 10/28/21 0430 10/29/21 0252 10/30/21 0453 10/31/21 0830 10/31/21 1432 11/01/21 0159 11/02/21 0222  WBC 11.5* 12.7* 12.5* 15.7*  --  14.9* 13.6*  NEUTROABS 6.9 8.0*  --   --   --  9.7*  --   HGB 7.9* 8.3* 8.4* 6.7* 8.6* 8.7* 8.4*  HCT 25.6* 27.3* 28.0* 21.7* 25.4* 27.5* 27.3*  MCV 92.1 92.9 94.0 95.2  --  90.8 92.2  PLT 356 362 390 346  --  314 316    Medications:     sodium chloride   Intravenous Once   sodium chloride  Intravenous Once   amLODipine  10 mg Oral Daily   atorvastatin  80 mg Oral Daily   atropine  1 drop Both Eyes BID   buPROPion  150 mg Oral Daily   calcium acetate  2,001 mg Oral TID WC   Chlorhexidine Gluconate Cloth  6 each Topical Q0600   darbepoetin (ARANESP) injection - DIALYSIS  100 mcg Intravenous Q Mon-HD   dorzolamide-timolol  1 drop Both Eyes BID   feeding supplement (NEPRO CARB STEADY)  237 mL Oral BID BM   gabapentin  300 mg Oral q1800   heparin  5,000 Units Subcutaneous Q12H   hydrALAZINE  100 mg Oral Q8H   insulin aspart  0-6 Units Subcutaneous TID WC   labetalol  200 mg Oral BID   lamoTRIgine  25 mg Oral Daily   latanoprost  1 drop Both Eyes QHS   losartan  75 mg Oral Daily   multivitamin  1 tablet Oral QHS   sodium chloride flush  3 mL Intravenous Q12H      Madelon Lips, MD 11/02/2021, 1:58 PM

## 2021-11-02 NOTE — Progress Notes (Addendum)
PT Cancellation Note  Patient Details Name: Tammy Moses MRN: [id number] DOB: 09-Aug-1966   Cancelled Treatment:    Reason Eval/Treat Not Completed: Patient at procedure or test/unavailable this afternoon, will return as time/schedule allows.   [place], PT, DPT   Acute Rehabilitation Department Pager #: [phone]   Sandra Cockayne 11/02/2021, 5:27 PM

## 2021-11-02 NOTE — Progress Notes (Signed)
[facility]  Subjective  - Sleepy   Objective (!) 149/53 70 97.8 F (36.6 C) (Oral) 17 99%  Intake/Output Summary (Last 24 hours) at 11/02/2021 0734 Last data filed at 11/01/2021 2113 Gross per 24 hour  Intake 243 ml  Output --  Net 243 ml       Right Gt amputation site healing without ischemic changes Right groin soft without hematoma Peroneal doppler signal Lungs non labored breathing  Assessment/Planning: 56 y.o. female is s/p R iliofemoral endarterectomy with toe amp; unsuccessful revascularization of SFA/pop lesion 3 Days Post-Op   Peroneal signal via doppler Right groin soft Dry dressing changed at bedside Darco shoe in room for heel weight bearing amputation Pending SNF  [name] 11/02/2021 7:34 AM --  Laboratory Lab Results: Recent Labs    11/01/21 0159 11/02/21 0222  WBC 14.9* 13.6*  HGB 8.7* 8.4*  HCT 27.5* 27.3*  PLT 314 316   BMET Recent Labs    10/31/21 0830 11/02/21 0222  NA 136 132*  K 4.8 4.4  CL 99 92*  CO2 23 27  GLUCOSE 83 89  BUN 41* 34*  CREATININE 10.52* 7.99*  CALCIUM 9.7 10.3    COAG Lab Results  Component Value Date   INR 1.2 10/21/2021   INR 1.2 09/16/2021   INR 0.88 01/30/2014   No results found for: PTT

## 2021-11-02 NOTE — Progress Notes (Signed)
PROGRESS NOTE  Tammy Moses OZD:[id number] DOB: 04-10-1966 DOA: 10/21/2021 PCP: [name], FNP  Brief History   Patient is a 56 year old female with history of ESRD on dialysis MWF, hypertension, normocytic anemia, COPD, blindness, tobacco use who presented with after falling down on her right knee when she slipped on ice.  She was unable to stand up and complained of pain and swelling of right knee and was brought to the emergency department.  Imaging showed nondisplaced right patella fracture on x-ray.  Orthopedics was consulted and recommended right knee brace with PT and follow-up as an outpatient.  Patient was also found to have right toe gangrene, ABI showed severe peripheral artery disease.  Vascular surgery following, underwent  left lower extremity arteriography on 1/3 with finding of severe infrainguinal occlusive disease bilaterally.   PT/OT recommending SNF on DC,TOC consulted.  The patient underwent s/p R iliofemoral endarterectomy with toe amp with unsuccessful revascularization of SFA/pop lesion.  Since returning from surgery the patient has been at times confused and somnolent. She pulled out her peripheral IV's and has continued to refuse telemetry monitoring. For me this morning she remains somewhat confused alternatively telling me that she had a procedure on her thumb and her toe. She also will tell me that she is at home, and then agrees that she is in the hospital. She then tells me that her cousin came to the hospital to say that she needed psychiatric help.  She received 1 unit PRBC's in HD this morning due to Hgb of 6.7. Hemoglobin today was be 8.4.   Consultants  Vascular surgery Cardiology  Procedures  R iliofemoral endarterectomy with toe amp with unsuccessful revascularization of SFA/pop lesion  Antibiotics   Anti-infectives (From admission, onward)    Start     Dose/Rate Route Frequency Ordered Stop   10/30/21 0600  ceFAZolin (ANCEF) IVPB 1 g/50 mL  premix       Note to Pharmacy: Send with pt to OR   1 g 100 mL/hr over 30 Minutes Intravenous To Short Stay 10/29/21 0903 10/30/21 1102      Subjective  The patient is resting comfortably. No new complaints.   Objective   Vitals:  Vitals:   11/02/21 1715 11/02/21 1756  BP: (!) 157/59 (!) 143/42  Pulse: 74 76  Resp: 13 18  Temp: (!) 97.5 F (36.4 C) 97.6 F (36.4 C)  SpO2: 98% 97%    Exam:  Constitutional:  The patient is awake, alert, and somewhat confused. No acute distress. Respiratory:  No increased work of breathing. No wheezes, rales, or rhonchi No tactile fremitus Cardiovascular:  Regular rate and rhythm No murmurs, ectopy, or gallups. No lateral PMI. No thrills. Abdomen:  Abdomen is soft, non-tender, non-distended No hernias, masses, or organomegaly Normoactive bowel sounds.  Musculoskeletal:  No cyanosis, clubbing, or edema Right foot is bandaged. Skin:  No rashes, lesions, ulcers palpation of skin: no induration or nodules Neurologic:  CN 2-12 intact Sensation all 4 extremities intact Psychiatric:  Mental status Mood, affect appropriate Orientation to person, place, time  judgment and insight appear intact   I have personally reviewed the following:   Today's Data  Vitals  Lab Data  CBC, BMP  Imaging    Cardiology Data  Echocardiogram  Other Data    Scheduled Meds:  sodium chloride   Intravenous Once   sodium chloride   Intravenous Once   amLODipine  10 mg Oral Daily   atorvastatin  80 mg Oral Daily  atropine  1 drop Both Eyes BID   buPROPion  150 mg Oral Daily   calcium acetate  2,001 mg Oral TID WC   Chlorhexidine Gluconate Cloth  6 each Topical Q0600   darbepoetin (ARANESP) injection - DIALYSIS  100 mcg Intravenous Q Mon-HD   dorzolamide-timolol  1 drop Both Eyes BID   feeding supplement (NEPRO CARB STEADY)  237 mL Oral BID BM   gabapentin  300 mg Oral q1800   heparin  5,000 Units Subcutaneous Q12H   hydrALAZINE  100  mg Oral Q8H   insulin aspart  0-6 Units Subcutaneous TID WC   labetalol  200 mg Oral BID   lamoTRIgine  25 mg Oral Daily   latanoprost  1 drop Both Eyes QHS   losartan  75 mg Oral Daily   multivitamin  1 tablet Oral QHS   sodium chloride flush  3 mL Intravenous Q12H   Continuous Infusions:  sodium chloride      Principal Problem:   Closed patellar sleeve fracture of right knee Active Problems:   Patellar sleeve fracture of right knee, closed, initial encounter   Gangrene of foot (HCC)   LOS: 10 days    A & P  Right knee patella nondisplaced fracture: Slipped on ice and fell on right knee.  Orthopedics consulted, recommended knee immobilizer, follow-up as an outpatient with orthopedics.  Currently on knee mobilizer.  PT/OT consulted recommended skilled NF on discharge.   Right big toe gangrene: ABI showed severe peripheral artery disease.  CT angiogram showed severe atherosclerotic disease throughout the abdomen, pelvis and bilateral lower extremities,severe bilateral outflow disease   Cardiology consulted for clearance for surgery. Echocardiogram was performed on 10/28/2020.  It has demonstrated EF of 60-65%, Normal LV function, and no regional wall motion abnormalities. LV cavity is mildly dilated. There is grade II diastolic dysfunction.   Pt underwent R iliofemoral endarterectomy with toe amp with unsuccessful revascularization of SFA/pop lesion with [name] on 10/30/2020. She has tolerated the procedure well. PT still pending.  Anemia: Multifactorial: Chronic anemia related to ESRD/post operative anemia. The patient's hemoglobin was 6.7 this morning. She received 1 unit in transfusion during HD this morning. Hgb 8.7 this morning.   ESRD on dialysis: Dialyzed on Monday, Wednesday, Friday. nephrology following.   Diabetes type 2: Currently on sliding insulin scale.  Monitor blood sugars.    Anemia of chronic disease: Hemoglobin is stable at 8.7 today. Iron panel showed severe  iron deficiency, given IV iron.  Also aranesp.  Monitor CBC.  Will transfuse if hemoglobin drops less than 7.   Hypertension: Monitor blood pressure.  Continue amlodipine, hydralazine, labetalol, losartan. BP remains elevated. Pt is due for HD today.  Altered mental status: The patient is a little more clear today. She is still not completely oriented.   Debility/deconditioning: PT/OT recommending skilled nursing facility on discharge.  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  Social worker following.   I have seen and examined this patient myself. I have spent 34 minutes in her evaluation and care.   DVT prophylaxis:Hep [place] Code Status: Full Family Communication:: None available Patient status:Inpatient   Dispo: The patient is from: Home              Anticipated d/c is to:SNF               Anticipated d/c date is: Awaiting vascular surgery procedure  Tammy Mcmichen, DO [facility] Direct contact: see [email or web address]  7PM-7AM contact night coverage  as above 11/02/2021, 6:15 PM  LOS: 6 days

## 2021-11-02 NOTE — Progress Notes (Signed)
Pt confused and continues to get out of bed and pull at lines. Pt is blind and a high fall risk. Pain has been addressed. Pt continues to be reoriented. PA paged and order received for sitter. Telesitter has been trialed and has been unsuccessful.

## 2021-11-03 LAB — GLUCOSE, CAPILLARY
Glucose-Capillary: 90 mg/dL (ref 70–99)
Glucose-Capillary: 138 mg/dL — ABNORMAL HIGH (ref 70–99)
Glucose-Capillary: 159 mg/dL — ABNORMAL HIGH (ref 70–99)

## 2021-11-03 NOTE — Progress Notes (Signed)
Called report to [place].   Lavenia Atlas, RN

## 2021-11-03 NOTE — Progress Notes (Signed)
Contacted [facility] to make clinic aware pt is still hospitalized. Will update clinic once d/c date is known.   Melven Sartorius Renal Navigator [phone]

## 2021-11-03 NOTE — Progress Notes (Signed)
Physical Therapy Treatment Patient Details Name: Tammy Moses MRN: [id number] DOB: 11-Sep-1966 Today's Date: 11/03/2021   History of Present Illness Pt is a 56 y/o female presenting to the ED on 12/28 secondary to fall. Found to have R patellar fx. Pt also with gangrene of R great toe, plan for right femoral endarterectomy and percutaneous intervention 1/6. PMH includes ESRD on HD, HTN, DM, COPD, blindness, tobacco abuse.    PT Comments    Pt needed encouragement, then consistent cues for direction, then redirection to task.  Emphasis on transitions, sit to stand safety into the RW.  Progression of gait stability and safety at a R LE NWB level.    Recommendations for follow up therapy are one component of a multi-disciplinary discharge planning process, led by the attending physician.  Recommendations may be updated based on patient status, additional functional criteria and insurance authorization.  Follow Up Recommendations  Skilled nursing-short term rehab (<3 hours/day)     Assistance Recommended at Discharge Frequent or constant Supervision/Assistance  Patient can return home with the following A little help with walking and/or transfers;A little help with bathing/dressing/bathroom;Assistance with cooking/housework;Assist for transportation   Equipment Recommendations  None recommended by PT;Other (comment) (TBD)    Recommendations for Other Services       Precautions / Restrictions Precautions Precautions: Fall Precaution Comments: blind, R foot drop Restrictions RLE Weight Bearing: Non weight bearing     Mobility  Bed Mobility Overal bed mobility: Needs Assistance Bed Mobility: Supine to Sit;Sit to Supine     Supine to sit: Supervision Sit to supine: Supervision   General bed mobility comments: increased time, no assist    Transfers Overall transfer level: Needs assistance Equipment used: Rolling walker (2 wheels) Transfers: Sit to/from Stand Sit to Stand:  Min guard           General transfer comment: pt needed cues to reinforce safe standing safety.  No assist needed and pt able to accomplish without R foot w/bearing    Ambulation/Gait Ambulation/Gait assistance: Min assist Gait Distance (Feet): 15 Feet Assistive device: Rolling walker (2 wheels) Gait Pattern/deviations: Step-to pattern Gait velocity: Decreased Gait velocity interpretation: <1.31 ft/sec, indicative of household ambulator   General Gait Details: Hop to gait pattern with cues and minA for RW management/proximity to the RW.  Pt maintained NWB 90% of time, and trace otherwise.   Stairs             Wheelchair Mobility    Modified Rankin (Stroke Patients Only)       Balance Overall balance assessment: Needs assistance Sitting-balance support: Feet supported;Feet unsupported;No upper extremity supported Sitting balance-Leahy Scale: Good       Standing balance-Leahy Scale: Poor Standing balance comment: Reliant on BUE support                            Cognition Arousal/Alertness: Awake/alert Behavior During Therapy: WFL for tasks assessed/performed Overall Cognitive Status: Difficult to assess                                 General Comments: pt able to follow all cues and instructions        Exercises      General Comments General comments (skin integrity, edema, etc.): In this new room, pt got disoriented with the track/direction we were taking and then tried to go AD first into the  bed to sit, needing v/t cues to reorient/redirect to backing up and sitting "buttock" first.      Pertinent Vitals/Pain Pain Assessment: Faces Faces Pain Scale: Hurts little more Pain Location: to pf R foot Pain Descriptors / Indicators: Grimacing;Sharp Pain Intervention(s): Monitored during session;Limited activity within patient's tolerance    Home Living                          Prior Function            PT Goals  (current goals can now be found in the care plan section) Acute Rehab PT Goals Patient Stated Goal: to go home PT Goal Formulation: With patient Time For Goal Achievement: 11/04/21 Potential to Achieve Goals: Fair Progress towards PT goals: Progressing toward goals    Frequency    Min 3X/week      PT Plan Current plan remains appropriate    Co-evaluation              AM-PAC PT "6 Clicks" Mobility   Outcome Measure  Help needed turning from your back to your side while in a flat bed without using bedrails?: A Little Help needed moving from lying on your back to sitting on the side of a flat bed without using bedrails?: A Little Help needed moving to and from a bed to a chair (including a wheelchair)?: A Little Help needed standing up from a chair using your arms (e.g., wheelchair or bedside chair)?: A Little Help needed to walk in hospital room?: A Little Help needed climbing 3-5 steps with a railing? : Total 6 Click Score: 16    End of Session   Activity Tolerance: Patient tolerated treatment well;Patient limited by fatigue Patient left: in bed;with call bell/phone within reach;with bed alarm set Nurse Communication: Mobility status PT Visit Diagnosis: Unsteadiness on feet (R26.81);Muscle weakness (generalized) (M62.81);History of falling (Z91.81);Repeated falls (R29.6);Difficulty in walking, not elsewhere classified (R26.2)     Time: 8921-1941 PT Time Calculation (min) (ACUTE ONLY): 30 min  Charges:  $Gait Training: 8-22 mins $Therapeutic Activity: 8-22 mins                     11/03/2021  Tammy Carne., PT Acute Rehabilitation Services [phone]  (pager) [phone]  (office)   Tammy Moses 11/03/2021, 5:59 PM

## 2021-11-03 NOTE — TOC Progression Note (Signed)
Transition of Care  [facility]) - Progression Note    Patient Details  Name: Tammy Moses MRN: [id number] Date of Birth: [birth year]  Transition of Care [facility]) CM/SW [place], [place] Phone Number: 11/03/2021, 4:18 PM  Clinical Narrative:     CSW received call from [facility] [phone]- inquired - if anticipated d/c date and  name of anticipated SNF. Informed was provided.   TOC will continue to follow and assist with discharge planning.  Thurmond Butts, MSW, LCSW Clinical Social Worker     Expected Discharge Plan: Skilled Nursing Facility Barriers to Discharge: Continued Medical Work up, SNF Pending bed offer  Expected Discharge Plan and Services Expected Discharge Plan: [place] Choice: [place] Living arrangements for the past 2 months: [place], Apartment                   DME Agency: [employer]       HH Arranged: PT [place] Agency: [facility] ([facility]         Social Determinants of Health (SDOH) Interventions    Readmission Risk Interventions Readmission Risk Prevention Plan 10/09/2021  Transportation Screening Complete  HRI or Home Care Consult Complete  SW Recovery Care/Counseling Consult Complete  Palliative Care Screening Not Applicable  Skilled Nursing Facility Complete  Some recent data might be hidden

## 2021-11-03 NOTE — Progress Notes (Signed)
[facility] Progress Note     Assessment/ Plan:   1) Patellar fracture: In immobilizer.  Management per primary team   2) ESRD: previously at [facility] now [facility] MWF schedule for now, next HD 11/04/21  3) Hypertension/volume: Blood pressure fairly high but overall improved.  Volume removal with dialysis.  Currently on amlodipine 10 mg daily, losartan 50mg  daily and hydralazine 100 mg 3 times daily. Possibly may be able to titrate down on hydralazine as we UF her on HD.   4) Anemia secondary to ESRD: Hgb 7.3, may need transfusion soon. Iron sat 7 and ferritin 742.  IV iron ordered; will hold 2 doses if transfusion is required.  Aranesp 155mcg given on Monday 1/2. Next dose on Monday 11/02/21.   5) secondary hyperparathyroidism: Continue PhosLo.  PTH 429. Phos 1/5 5.3.   6) Diabetes Mellitus Type 2: Continue management per primary team   7) Gangrenous right toe: Related to peripheral arterial disease.  Vascular surgery following -> s/p  1/6 rt ilio/fem endarterectomy and  rt GT amputation w/ good capillary bleeding but unable to intervene on rt SFA/pop lesion.  Subjective:    Tolerated HD yesterday.  Curled up in bed this AM, nods yes/ no to questions.     Objective:   BP (!) 147/50 (BP Location: Right Arm)    Pulse 65    Temp 98.7 F (37.1 C) (Oral)    Resp 17    Ht 5\' 1"  (1.549 m)    Wt 52 kg    SpO2 98%    BMI 21.66 kg/m   Intake/Output Summary (Last 24 hours) at 11/03/2021 1055 Last data filed at 11/02/2021 1715 Gross per 24 hour  Intake 120 ml  Output 2000 ml  Net -1880 ml   Weight change:   Physical Exam: Constitutional: chronically ill appearing, NAD HEENT: R eye clouded CV: normal rate, no edema Respiratory: clear bilaterally Gastrointestinal: SNDNT + BS Skin: s/p amputation of right toe, dressed Access: L AVF, + T/B    Imaging: No results found.  Labs: BMET Recent Labs  Lab 10/27/21 1453 10/28/21 0832 10/29/21 0252  10/30/21 0453 10/31/21 0830 11/02/21 0222  NA  --  133* 132* 135 136 132*  K  --  4.0 4.0 4.1 4.8 4.4  CL  --  95* 98 96* 99 92*  CO2  --  26 26 26 23 27   GLUCOSE  --  110* 111* 109* 83 89  BUN  --  30* 18 29* 41* 34*  CREATININE  --  8.57* 6.35* 8.89* 10.52* 7.99*  CALCIUM  --  9.8 9.5 9.9 9.7 10.3  PHOS 4.4 5.3*  --   --  6.2*  --    CBC Recent Labs  Lab 10/28/21 0430 10/29/21 0252 10/30/21 0453 10/31/21 0830 10/31/21 1432 11/01/21 0159 11/02/21 0222  WBC 11.5* 12.7* 12.5* 15.7*  --  14.9* 13.6*  NEUTROABS 6.9 8.0*  --   --   --  9.7*  --   HGB 7.9* 8.3* 8.4* 6.7* 8.6* 8.7* 8.4*  HCT 25.6* 27.3* 28.0* 21.7* 25.4* 27.5* 27.3*  MCV 92.1 92.9 94.0 95.2  --  90.8 92.2  PLT 356 362 390 346  --  314 316    Medications:     sodium chloride   Intravenous Once   sodium chloride   Intravenous Once   amLODipine  10 mg Oral Daily   atorvastatin  80 mg Oral Daily   atropine  1  drop Both Eyes BID   buPROPion  150 mg Oral Daily   calcium acetate  2,001 mg Oral TID WC   Chlorhexidine Gluconate Cloth  6 each Topical Q0600   darbepoetin (ARANESP) injection - DIALYSIS  100 mcg Intravenous Q Mon-HD   dorzolamide-timolol  1 drop Both Eyes BID   feeding supplement (NEPRO CARB STEADY)  237 mL Oral BID BM   gabapentin  300 mg Oral q1800   heparin  5,000 Units Subcutaneous Q12H   hydrALAZINE  100 mg Oral Q8H   insulin aspart  0-6 Units Subcutaneous TID WC   labetalol  200 mg Oral BID   lamoTRIgine  25 mg Oral Daily   latanoprost  1 drop Both Eyes QHS   losartan  75 mg Oral Daily   multivitamin  1 tablet Oral QHS   sodium chloride flush  3 mL Intravenous Q12H      Madelon Lips, MD 11/03/2021, 10:55 AM

## 2021-11-03 NOTE — Progress Notes (Signed)
Pt refused morning CBG, meds, and continues to refuse tele.

## 2021-11-03 NOTE — Progress Notes (Signed)
PROGRESS NOTE  [name]  EHM:[id number] DOB: [birth year] DOA: 10/21/2021 PCP: [name], FNP   Brief Narrative: Tammy Moses is a 56 year old female with history of ESRD on dialysis MWF, hypertension, normocytic anemia, COPD, blindness, tobacco use who presented with after falling down on her right knee when she slipped on ice.  She was unable to stand up and complained of pain and swelling of right knee and was brought to the emergency department.  Imaging showed nondisplaced right patella fracture on x-ray.  Orthopedics was consulted and recommended right knee brace with PT and follow-up as an outpatient.  Patient was also found to have right toe gangrene, ABI showed severe peripheral artery disease.  Vascular surgery following, underwent  left lower extremity arteriography on 1/3 with finding of severe infrainguinal occlusive disease bilaterally. She underwent right great toe amputation, right iliofemoral endarterectomy with vein patch angioplasty on 1/6. Hospitalization complicated by delirium. PT/OT recommending SNF on DC, TOC consulted.  Assessment & Plan: Principal Problem:   Closed patellar sleeve fracture of right knee Active Problems:   Patellar sleeve fracture of right knee, closed, initial encounter   Gangrene of foot (HCC)  Closed, nondisplaced fracture of right patella: Due to fall.  - Orthopedics recommended knee immobilizer and outpatient follow up.  - Continue PT/OT     Right great toe gangrene, severe PAD: ABI showed severe peripheral artery disease.  CT angiogram showed severe atherosclerotic disease throughout the abdomen, pelvis and bilateral lower extremities,severe bilateral outflow disease . - s/p R iliofemoral endarterectomy with toe amp with unsuccessful revascularization of SFA/pop lesion with [name] on 10/30/2020.  - Note high risk of not healing amputation wound, would require more proximal amputation.  - R heel weight bearing in darco shoe - Pt  on high-intensity statin. Defer to VVS if/when to initiate antiplatelet therapy.   ESRD:  - Continue HD per nephrology routinely   T2DM: Recently well-controlled with HbA1c 5.9%.  - Continue SSI    Anemia of chronic disease, iron deficiency, and acute blood loss anemia:  - s/p IV iron - Aranesop per nephrology  - Transfuse prn, required 1u PRBCs on 1/7. Recheck CBC in AM.   Hypertension: - Continue norvasc, labetalol, losartan.     Acute delirium: Worsened by surgery. This is my first interaction with her, but she appears to be improving, no longer requiring sitter. No focal deficits on exam today. - Delirium precautions.    Debility/deconditioning: PT/OT recommending skilled nursing facility on discharge.  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  Social worker following.  DVT prophylaxis: Heparin Code Status: Full Family Communication: None at bedside Disposition Plan:  Status is: Inpatient  Remains inpatient appropriate because: Unsafe DC, planning discharge to SNF  Consultants:  Nephrology Vascular surgery Orthopedics  Procedures:  10/30/21 RIGHT FEMORAL ENDARTERECTOMY WITH ANGIOPLASTY OF TIBIAL ARTERY Serafina Mitchell, MD   10/27/21 ABDOMINAL AORTOGRAM W/LOWER EXTREMITY Serafina Mitchell, MD   Antimicrobials: Ancef x1 1/6   Subjective: No current pain, withdrawn but responds appropriately. Denies any issues at this time.   Objective: Vitals:   11/03/21 1247 11/03/21 1453 11/03/21 1525 11/03/21 1527  BP: (!) 109/91  (!) 185/43 (!) 178/53  Pulse: 73  70 70  Resp: 12     Temp: 98.4 F (36.9 C) 98.1 F (36.7 C)    TempSrc: Oral Oral    SpO2: 95%  100%   Weight:      Height:  Intake/Output Summary (Last 24 hours) at 11/03/2021 1620 Last data filed at 11/03/2021 1200 Gross per 24 hour  Intake 300 ml  Output 2000 ml  Net -1700 ml   Filed Weights   10/31/21 1245 11/02/21 1330 11/02/21 1715  Weight: 52.9 kg 54.4 kg 52 kg    Gen: Chronically ill-appearing  female in no distress Pulm: Non-labored breathing. Clear to auscultation bilaterally.  CV: Regular rate and rhythm. No murmur, rub, or gallop. No JVD, no pitting pedal edema. GI: Abdomen soft, non-tender, non-distended, with normoactive bowel sounds. No organomegaly or masses felt. Ext: Warm, no deformities Skin: R foot great toe site is c/d/i Neuro: Alert and oriented, not cooperative with exam. Psych: Calm, UTD.  Data Reviewed: I have personally reviewed following labs and imaging studies  CBC: Recent Labs  Lab 10/28/21 0430 10/29/21 0252 10/30/21 0453 10/31/21 0830 10/31/21 1432 11/01/21 0159 11/02/21 0222  WBC 11.5* 12.7* 12.5* 15.7*  --  14.9* 13.6*  NEUTROABS 6.9 8.0*  --   --   --  9.7*  --   HGB 7.9* 8.3* 8.4* 6.7* 8.6* 8.7* 8.4*  HCT 25.6* 27.3* 28.0* 21.7* 25.4* 27.5* 27.3*  MCV 92.1 92.9 94.0 95.2  --  90.8 92.2  PLT 356 362 390 346  --  314 664   Basic Metabolic Panel: Recent Labs  Lab 10/28/21 0832 10/29/21 0252 10/30/21 0453 10/31/21 0830 11/02/21 0222  NA 133* 132* 135 136 132*  K 4.0 4.0 4.1 4.8 4.4  CL 95* 98 96* 99 92*  CO2 26 26 26 23 27   GLUCOSE 110* 111* 109* 83 89  BUN 30* 18 29* 41* 34*  CREATININE 8.57* 6.35* 8.89* 10.52* 7.99*  CALCIUM 9.8 9.5 9.9 9.7 10.3  PHOS 5.3*  --   --  6.2*  --    GFR: Estimated Creatinine Clearance: 5.9 mL/min (A) (by C-G formula based on SCr of 7.99 mg/dL (H)). Liver Function Tests: Recent Labs  Lab 10/28/21 0832 10/31/21 0830  ALBUMIN 3.0* 3.0*   No results for input(s): LIPASE, AMYLASE in the last 168 hours. No results for input(s): AMMONIA in the last 168 hours. Coagulation Profile: No results for input(s): INR, PROTIME in the last 168 hours. Cardiac Enzymes: No results for input(s): CKTOTAL, CKMB, CKMBINDEX, TROPONINI in the last 168 hours. BNP (last 3 results) No results for input(s): PROBNP in the last 8760 hours. HbA1C: No results for input(s): HGBA1C in the last 72 hours. CBG: Recent Labs   Lab 11/02/21 0604 11/02/21 1109 11/02/21 1752 11/02/21 2218 11/03/21 1205  GLUCAP 87 107* 115* 104* 90   Lipid Profile: No results for input(s): CHOL, HDL, LDLCALC, TRIG, CHOLHDL, LDLDIRECT in the last 72 hours. Thyroid Function Tests: No results for input(s): TSH, T4TOTAL, FREET4, T3FREE, THYROIDAB in the last 72 hours. Anemia Panel: No results for input(s): VITAMINB12, FOLATE, FERRITIN, TIBC, IRON, RETICCTPCT in the last 72 hours. Urine analysis:    Component Value Date/Time   COLORURINE YELLOW 09/16/2021 1511   APPEARANCEUR HAZY (A) 09/16/2021 1511   LABSPEC 1.013 09/16/2021 1511   PHURINE 5.0 09/16/2021 1511   GLUCOSEU >=500 (A) 09/16/2021 1511   HGBUR SMALL (A) 09/16/2021 1511   BILIRUBINUR NEGATIVE 09/16/2021 1511   BILIRUBINUR Negative 12/11/2019 0809   KETONESUR 5 (A) 09/16/2021 1511   PROTEINUR 100 (A) 09/16/2021 1511   UROBILINOGEN 0.2 12/11/2019 0809   UROBILINOGEN 0.2 12/13/2017 1353   NITRITE NEGATIVE 09/16/2021 1511   LEUKOCYTESUR NEGATIVE 09/16/2021 1511   Recent Results (from the past  240 hour(s))  Surgical pcr screen     Status: None   Collection Time: 10/30/21  3:10 AM   Specimen: Nasal Mucosa; Nasal Swab  Result Value Ref Range Status   MRSA, PCR NEGATIVE NEGATIVE Final   Staphylococcus aureus NEGATIVE NEGATIVE Final    Comment: (NOTE) The Xpert SA Assay (FDA approved for NASAL specimens in patients 59 years of age and older), is one component of a comprehensive surveillance program. It is not intended to diagnose infection nor to guide or monitor treatment. Performed at [facility], [place] [address], [place], [place] [address]       Radiology Studies: No results found.  Scheduled Meds:  sodium chloride   Intravenous Once   sodium chloride   Intravenous Once   amLODipine  10 mg Oral Daily   atorvastatin  80 mg Oral Daily   atropine  1 drop Both Eyes BID   buPROPion  150 mg Oral Daily   calcium acetate  2,001 mg Oral TID WC    Chlorhexidine Gluconate Cloth  6 each Topical Q0600   darbepoetin (ARANESP) injection - DIALYSIS  100 mcg Intravenous Q Mon-HD   dorzolamide-timolol  1 drop Both Eyes BID   feeding supplement (NEPRO CARB STEADY)  237 mL Oral BID BM   gabapentin  300 mg Oral q1800   heparin  5,000 Units Subcutaneous Q12H   hydrALAZINE  100 mg Oral Q8H   insulin aspart  0-6 Units Subcutaneous TID WC   labetalol  200 mg Oral BID   lamoTRIgine  25 mg Oral Daily   latanoprost  1 drop Both Eyes QHS   losartan  75 mg Oral Daily   multivitamin  1 tablet Oral QHS   sodium chloride flush  3 mL Intravenous Q12H   Continuous Infusions:  sodium chloride       LOS: 11 days    Patrecia Pour, MD [facility] [email or web address] 11/03/2021, 4:20 PM

## 2021-11-03 NOTE — Progress Notes (Signed)
Mobility Specialist: Progress Note   11/03/21 1108  Mobility  Activity Stood at bedside  Level of Assistance Minimal assist, patient does 75% or more  Assistive Device Front wheel walker  Mobility  (Stood EOB)  Mobility Response Tolerated fair  Mobility performed by Mobility specialist  $Mobility charge 1 Mobility   Pre-Mobility: 73 HR Post-Mobility: 70 HR  Pt contact guard to sit EOB and minA to stand. Pt c/o 7/10 pain in her R knee during session. Pt able to demonstrate some LE exercises after standing x1 and then requested to return to bed. Pt back to bed with bed alarm on.   [facility] Aubrianna Orchard Mobility Specialist Mobility Specialist 4 [place]: [phone] Mobility Specialist 2 [place]: [phone]

## 2021-11-04 DIAGNOSIS — Z9889 Other specified postprocedural states: Secondary | ICD-10-CM

## 2021-11-04 LAB — CBC
HCT: 29.8 % — ABNORMAL LOW (ref 36.0–46.0)
Hemoglobin: 9.2 g/dL — ABNORMAL LOW (ref 12.0–15.0)
MCH: 28.4 pg (ref 26.0–34.0)
MCHC: 30.9 g/dL (ref 30.0–36.0)
MCV: 92 fL (ref 80.0–100.0)
Platelets: 315 10*3/uL (ref 150–400)
RBC: 3.24 MIL/uL — ABNORMAL LOW (ref 3.87–5.11)
RDW: 17.3 % — ABNORMAL HIGH (ref 11.5–15.5)
WBC: 13.7 10*3/uL — ABNORMAL HIGH (ref 4.0–10.5)
nRBC: 0 % (ref 0.0–0.2)

## 2021-11-04 LAB — RENAL FUNCTION PANEL
Albumin: 2.8 g/dL — ABNORMAL LOW (ref 3.5–5.0)
Anion gap: 11 (ref 5–15)
BUN: 30 mg/dL — ABNORMAL HIGH (ref 6–20)
CO2: 31 mmol/L (ref 22–32)
Calcium: 9.9 mg/dL (ref 8.9–10.3)
Chloride: 91 mmol/L — ABNORMAL LOW (ref 98–111)
Creatinine, Ser: 6.99 mg/dL — ABNORMAL HIGH (ref 0.44–1.00)
GFR, Estimated: 6 mL/min — ABNORMAL LOW (ref >60)
Glucose, Bld: 98 mg/dL (ref 70–99)
Phosphorus: 4.2 mg/dL (ref 2.5–4.6)
Potassium: 4 mmol/L (ref 3.5–5.1)
Sodium: 133 mmol/L — ABNORMAL LOW (ref 135–145)

## 2021-11-04 LAB — GLUCOSE, CAPILLARY
Glucose-Capillary: 128 mg/dL — ABNORMAL HIGH (ref 70–99)
Glucose-Capillary: 119 mg/dL — ABNORMAL HIGH (ref 70–99)
Glucose-Capillary: 125 mg/dL — ABNORMAL HIGH (ref 70–99)

## 2021-11-04 MED ORDER — ASPIRIN EC 81 MG PO TBEC
81.0000 mg | DELAYED_RELEASE_TABLET | Freq: Every day | ORAL | Status: DC
  Administered 2021-11-05 – 2021-11-06 (×2): 81 mg via ORAL
  Filled 2021-11-04 (×2): qty 1

## 2021-11-04 NOTE — Progress Notes (Addendum)
°  Progress Note    11/04/2021 7:14 AM 5 Days Post-Op  Subjective:  says she has some pain in the right foot  afebrile  Vitals:   11/04/21 0507 11/04/21 0507  BP: (!) 152/51 (!) 152/51  Pulse: 73 73  Resp:    Temp: 98.1 F (36.7 C) 98.1 F (36.7 C)  SpO2: 100% 100%    Physical Exam: General:  no distress Lungs:  non labored Incisions:  right groin healing; hematoma distally/medial and is soft.   Extremities:  right foot warm.  No doppler available.     CBC    Component Value Date/Time   WBC 13.7 (H) 11/04/2021 0059   RBC 3.24 (L) 11/04/2021 0059   HGB 9.2 (L) 11/04/2021 0059   HGB 12.4 12/13/2017 1416   HCT 29.8 (L) 11/04/2021 0059   HCT 37.8 12/13/2017 1416   PLT 315 11/04/2021 0059   PLT 254 12/13/2017 1416   MCV 92.0 11/04/2021 0059   MCV 83 12/13/2017 1416   MCH 28.4 11/04/2021 0059   MCHC 30.9 11/04/2021 0059   RDW 17.3 (H) 11/04/2021 0059   RDW 15.8 (H) 12/13/2017 1416   LYMPHSABS 2.5 11/01/2021 0159   LYMPHSABS 4.1 (H) 12/13/2017 1416   MONOABS 2.3 (H) 11/01/2021 0159   EOSABS 0.3 11/01/2021 0159   EOSABS 0.3 12/13/2017 1416   BASOSABS 0.1 11/01/2021 0159   BASOSABS 0.1 12/13/2017 1416    BMET    Component Value Date/Time   NA 133 (L) 11/04/2021 0059   NA 146 (H) 05/10/2017 0950   K 4.0 11/04/2021 0059   CL 91 (L) 11/04/2021 0059   CO2 31 11/04/2021 0059   GLUCOSE 98 11/04/2021 0059   BUN 30 (H) 11/04/2021 0059   BUN 38 (H) 05/10/2017 0950   CREATININE 6.99 (H) 11/04/2021 0059   CREATININE 3.40 (H) 03/11/2017 1505   CALCIUM 9.9 11/04/2021 0059   GFRNONAA 6 (L) 11/04/2021 0059   GFRNONAA 15 (L) 03/11/2017 1505   GFRAA 5 (L) 05/05/2020 1314   GFRAA 17 (L) 03/11/2017 1505    INR    Component Value Date/Time   INR 1.2 10/21/2021 1425     Intake/Output Summary (Last 24 hours) at 11/04/2021 0714 Last data filed at 11/03/2021 1200 Gross per 24 hour  Intake 300 ml  Output --  Net 300 ml     Assessment/Plan:  56 y.o. female is  s/p:  Right iliofemoral endarterectomy with vein patch angioplasty, right great toe amputation and failed angioplasty right SFA and popliteal artery  5 Days Post-Op   -pt's right foot is warm.  No doppler available.   -righgt GT amp as pictured above.  Continue to monitor as risk for not healing.  Continue heel weight bearing only with Darco shoe.   -hematoma left groin-soft.  Incision looks fine. -pt not on aspirin-okay to start baby aspirin per vascular surgery.  Continue statin.    Leontine Locket, PA-C [facility] [phone] 11/04/2021 7:14 AM  I agree with the above.  We will continue to closely monitor the toe.  Annamarie Major

## 2021-11-04 NOTE — Progress Notes (Signed)
PROGRESS NOTE  Tammy Moses  JYN:[id number] DOB: Oct 20, 1966 DOA: 10/21/2021 PCP: [name], FNP   Brief Narrative: Tammy Moses is a 56 year old female with history of ESRD on dialysis MWF, hypertension, normocytic anemia, COPD, blindness, tobacco use who presented with after falling down on her right knee when she slipped on ice.  She was unable to stand up and complained of pain and swelling of right knee and was brought to the emergency department.  Imaging showed nondisplaced right patella fracture on x-ray.  Orthopedics was consulted and recommended right knee brace with PT and follow-up as an outpatient.  Patient was also found to have right toe gangrene, ABI showed severe peripheral artery disease.  Vascular surgery following, underwent  left lower extremity arteriography on 1/3 with finding of severe infrainguinal occlusive disease bilaterally. She underwent right great toe amputation, right iliofemoral endarterectomy with vein patch angioplasty on 1/6. Hospitalization complicated by delirium. PT/OT recommending SNF on DC, TOC consulted.  Assessment & Plan: Principal Problem:   Closed patellar sleeve fracture of right knee Active Problems:   Patellar sleeve fracture of right knee, closed, initial encounter   Gangrene of foot (HCC)  Closed, nondisplaced fracture of right patella: Due to fall.  - Orthopedics recommended knee immobilizer and outpatient follow up.  - Continue PT/OT     Right great toe gangrene, severe PAD: ABI showed severe peripheral artery disease.  CT angiogram showed severe atherosclerotic disease throughout the abdomen, pelvis and bilateral lower extremities,severe bilateral outflow disease . - s/p R iliofemoral endarterectomy with toe amp with unsuccessful revascularization of SFA/pop lesion with [name] on 10/30/2020.  - Note high risk of not healing amputation wound, would require more proximal amputation.  - R heel weight bearing in darco shoe - Pt  on high-intensity statin -can start baby ASA -large hematoma in right groin  ESRD:  - Continue HD per nephrology routinely   T2DM: Recently well-controlled with HbA1c 5.9%.  - Continue SSI    Anemia of chronic disease, iron deficiency, and acute blood loss anemia:  - s/p IV iron - Aranesop per nephrology  - Transfuse prn, required 1u PRBCs on 1/7- suspect due to large hematoma of groin   Hypertension: - Continue norvasc, labetalol, losartan.     Acute delirium: Worsened by surgery.  - Delirium precautions.    Debility/deconditioning: PT/OT recommending skilled nursing facility on discharge.  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  Social worker following.   DVT prophylaxis: Heparin Code Status: Full Disposition Plan:  Status is: Inpatient  Remains inpatient appropriate because: Unsafe DC, planning discharge to SNF  Consultants:  Nephrology Vascular surgery Orthopedics  Procedures:  10/30/21 RIGHT FEMORAL ENDARTERECTOMY WITH ANGIOPLASTY OF TIBIAL ARTERY Serafina Mitchell, MD   10/27/21 ABDOMINAL AORTOGRAM W/LOWER EXTREMITY Serafina Mitchell, MD     Subjective: Worried about the "mass" in her groin  Objective: Vitals:   11/04/21 1130 11/04/21 1147 11/04/21 1200 11/04/21 1233  BP: (!) 172/65 (!) 152/76 (!) 146/66 (!) 139/34  Pulse: 68 69 71 69  Resp:    18  Temp:    97.7 F (36.5 C)  TempSrc:    Oral  SpO2:    100%  Weight:      Height:        Intake/Output Summary (Last 24 hours) at 11/04/2021 1321 Last data filed at 11/04/2021 1147 Gross per 24 hour  Intake --  Output 500 ml  Net -500 ml   [employer]   11/02/21  1330 11/02/21 1715 11/04/21 0800  Weight: 54.4 kg 52 kg 53.1 kg     General: Appearance:    Chronically ill appearing female in no acute distress     Lungs:      respirations unlabored  Heart:    Normal heart rate.    MS:   Right great toe amputation   Neurologic:   Awake, alert, re-directable      Data Reviewed: I have personally reviewed  following labs and imaging studies  CBC: Recent Labs  Lab 10/29/21 0252 10/30/21 0453 10/31/21 0830 10/31/21 1432 11/01/21 0159 11/02/21 0222 11/04/21 0059  WBC 12.7* 12.5* 15.7*  --  14.9* 13.6* 13.7*  NEUTROABS 8.0*  --   --   --  9.7*  --   --   HGB 8.3* 8.4* 6.7* 8.6* 8.7* 8.4* 9.2*  HCT 27.3* 28.0* 21.7* 25.4* 27.5* 27.3* 29.8*  MCV 92.9 94.0 95.2  --  90.8 92.2 92.0  PLT 362 390 346  --  314 316 740   Basic Metabolic Panel: Recent Labs  Lab 10/29/21 0252 10/30/21 0453 10/31/21 0830 11/02/21 0222 11/04/21 0059  NA 132* 135 136 132* 133*  K 4.0 4.1 4.8 4.4 4.0  CL 98 96* 99 92* 91*  CO2 26 26 23 27 31   GLUCOSE 111* 109* 83 89 98  BUN 18 29* 41* 34* 30*  CREATININE 6.35* 8.89* 10.52* 7.99* 6.99*  CALCIUM 9.5 9.9 9.7 10.3 9.9  PHOS  --   --  6.2*  --  4.2   GFR: Estimated Creatinine Clearance: 6.7 mL/min (A) (by C-G formula based on SCr of 6.99 mg/dL (H)). Liver Function Tests: Recent Labs  Lab 10/31/21 0830 11/04/21 0059  ALBUMIN 3.0* 2.8*   No results for input(s): LIPASE, AMYLASE in the last 168 hours. No results for input(s): AMMONIA in the last 168 hours. Coagulation Profile: No results for input(s): INR, PROTIME in the last 168 hours. Cardiac Enzymes: No results for input(s): CKTOTAL, CKMB, CKMBINDEX, TROPONINI in the last 168 hours. BNP (last 3 results) No results for input(s): PROBNP in the last 8760 hours. HbA1C: No results for input(s): HGBA1C in the last 72 hours. CBG: Recent Labs  Lab 11/02/21 2218 11/03/21 1205 11/03/21 1621 11/03/21 2237 11/04/21 1236  GLUCAP 104* 90 138* 159* 128*   Lipid Profile: No results for input(s): CHOL, HDL, LDLCALC, TRIG, CHOLHDL, LDLDIRECT in the last 72 hours. Thyroid Function Tests: No results for input(s): TSH, T4TOTAL, FREET4, T3FREE, THYROIDAB in the last 72 hours. Anemia Panel: No results for input(s): VITAMINB12, FOLATE, FERRITIN, TIBC, IRON, RETICCTPCT in the last 72 hours. Urine analysis:     Component Value Date/Time   COLORURINE YELLOW 09/16/2021 1511   APPEARANCEUR HAZY (A) 09/16/2021 1511   LABSPEC 1.013 09/16/2021 1511   PHURINE 5.0 09/16/2021 1511   GLUCOSEU >=500 (A) 09/16/2021 1511   HGBUR SMALL (A) 09/16/2021 1511   BILIRUBINUR NEGATIVE 09/16/2021 1511   BILIRUBINUR Negative 12/11/2019 0809   KETONESUR 5 (A) 09/16/2021 1511   PROTEINUR 100 (A) 09/16/2021 1511   UROBILINOGEN 0.2 12/11/2019 0809   UROBILINOGEN 0.2 12/13/2017 1353   NITRITE NEGATIVE 09/16/2021 1511   LEUKOCYTESUR NEGATIVE 09/16/2021 1511   Recent Results (from the past 240 hour(s))  Surgical pcr screen     Status: None   Collection Time: 10/30/21  3:10 AM   Specimen: Nasal Mucosa; Nasal Swab  Result Value Ref Range Status   MRSA, PCR NEGATIVE NEGATIVE Final   Staphylococcus aureus NEGATIVE NEGATIVE Final  Comment: (NOTE) The Xpert SA Assay (FDA approved for NASAL specimens in patients 36 years of age and older), is one component of a comprehensive surveillance program. It is not intended to diagnose infection nor to guide or monitor treatment. Performed at [facility], [place] [address] [place], [place] [address]       Radiology Studies: No results found.  Scheduled Meds:  sodium chloride   Intravenous Once   sodium chloride   Intravenous Once   amLODipine  10 mg Oral Daily   atorvastatin  80 mg Oral Daily   atropine  1 drop Both Eyes BID   buPROPion  150 mg Oral Daily   calcium acetate  2,001 mg Oral TID WC   Chlorhexidine Gluconate Cloth  6 each Topical Q0600   darbepoetin (ARANESP) injection - DIALYSIS  100 mcg Intravenous Q Mon-HD   dorzolamide-timolol  1 drop Both Eyes BID   feeding supplement (NEPRO CARB STEADY)  237 mL Oral BID BM   gabapentin  300 mg Oral q1800   heparin  5,000 Units Subcutaneous Q12H   hydrALAZINE  100 mg Oral Q8H   insulin aspart  0-6 Units Subcutaneous TID WC   labetalol  200 mg Oral BID   lamoTRIgine  25 mg Oral Daily   latanoprost  1 drop  Both Eyes QHS   losartan  75 mg Oral Daily   multivitamin  1 tablet Oral QHS   sodium chloride flush  3 mL Intravenous Q12H   Continuous Infusions:  sodium chloride       LOS: 12 days    Geradine Girt, DO [facility] [email or web address] 11/04/2021, 1:21 PM

## 2021-11-04 NOTE — Progress Notes (Signed)
Mobility Specialist Progress Note:   11/04/21 1315  Mobility  Activity Dangled on edge of bed  Level of Assistance Independent  RLE Weight Bearing NWB  Mobility Response Tolerated well  Mobility performed by Mobility specialist  $Mobility charge 1 Mobility   Responded to bed alarm, pt attempting to get OOB. Redirected pt back to bed, sat EOB for ~39min. Educated pt on WB precautions, pt understood. Pt c/o pain in groin and R foot. Pt back in bed with telesitter in room.   Nelta Numbers Mobility Specialist  Phone [phone]

## 2021-11-04 NOTE — Progress Notes (Signed)
Physical Therapy Treatment Patient Details Name: Tammy Moses MRN: [id number] DOB: 1966/01/23 Today's Date: 11/04/2021   History of Present Illness Pt is a 56 y/o female presenting to the ED on 12/28 secondary to fall. Found to have R patellar fx. Pt also with gangrene of R great toe, s/p femoral endarterectomy and percutaneous intervention 1/6. PMH includes ESRD on HD, HTN, DM, COPD, blindness, tobacco abuse.    PT Comments    Pt tolerates treatment well despite significant confusion. Pt requires re-orientation to place, thinking she had been left downstairs in the parking garage. Pt also with very poor memory of WB precautions and ROM restrictions, requiring cues for all precautions. Pt's endurance remains limited and she requires intermittent cues during gait to maintain NWB through RLE. Pt will continue to benefit from PT services in an effort to reduce risk of falls and injury.   Recommendations for follow up therapy are one component of a multi-disciplinary discharge planning process, led by the attending physician.  Recommendations may be updated based on patient status, additional functional criteria and insurance authorization.  Follow Up Recommendations  Skilled nursing-short term rehab (<3 hours/day)     Assistance Recommended at Discharge Frequent or constant Supervision/Assistance  Patient can return home with the following A little help with walking and/or transfers;A little help with bathing/dressing/bathroom;Assistance with cooking/housework;Assist for transportation;Help with stairs or ramp for entrance;Direct supervision/assist for medications management;Direct supervision/assist for financial management   Equipment Recommendations  Rolling walker (2 wheels);Wheelchair (measurements PT);BSC/3in1    Recommendations for Other Services       Precautions / Restrictions Precautions Precautions: Fall Precaution Comments: blind, R foot drop Required Braces or Orthoses: Knee  Immobilizer - Right Knee Immobilizer - Right: On at all times (pt with KI off upon PT arrival, PT replaces KI) Restrictions Weight Bearing Restrictions: No RLE Weight Bearing: Non weight bearing     Mobility  Bed Mobility Overal bed mobility: Needs Assistance Bed Mobility: Supine to Sit;Sit to Supine     Supine to sit: Supervision Sit to supine: Supervision        Transfers Overall transfer level: Needs assistance Equipment used: Rolling walker (2 wheels) Transfers: Sit to/from Stand Sit to Stand: Min assist Stand pivot transfers: Min assist         General transfer comment: tactile cues to direct walker due to vision impairments    Ambulation/Gait Ambulation/Gait assistance: Min assist Gait Distance (Feet): 14 Feet Assistive device: Rolling walker (2 wheels) Gait Pattern/deviations:  (hop-to gait) Gait velocity: reduced Gait velocity interpretation: <1.31 ft/sec, indicative of household ambulator   General Gait Details: PT directing RW at times due to impaired vision, verbal cues provided to maintain NWB RLE   Stairs             Wheelchair Mobility    Modified Rankin (Stroke Patients Only)       Balance Overall balance assessment: Needs assistance Sitting-balance support: No upper extremity supported;Feet supported Sitting balance-Leahy Scale: Good     Standing balance support: Bilateral upper extremity supported;Reliant on assistive device for balance Standing balance-Leahy Scale: Poor Standing balance comment: reliant on UE support to maintain balance and WB precautions                            Cognition Arousal/Alertness: Awake/alert Behavior During Therapy: WFL for tasks assessed/performed Overall Cognitive Status: No family/caregiver present to determine baseline cognitive functioning  General Comments: pt follows one-step commands well. Pt is disoriented to place, thinking she  was left downstairs in the parking garage. Pt has poor recall of medical procedures and WB/mobility precautions.        Exercises      General Comments General comments (skin integrity, edema, etc.): VSS on RA      Pertinent Vitals/Pain Pain Assessment: No/denies pain    Home Living                          Prior Function            PT Goals (current goals can now be found in the care plan section) Acute Rehab PT Goals Patient Stated Goal: to go home PT Goal Formulation: With patient Time For Goal Achievement: 11/18/21 Potential to Achieve Goals: Fair Progress towards PT goals: Not progressing toward goals - comment (limited by confusion)    Frequency    Min 3X/week      PT Plan Current plan remains appropriate    Co-evaluation              AM-PAC PT "6 Clicks" Mobility   Outcome Measure  Help needed turning from your back to your side while in a flat bed without using bedrails?: A Little Help needed moving from lying on your back to sitting on the side of a flat bed without using bedrails?: A Little Help needed moving to and from a bed to a chair (including a wheelchair)?: A Little Help needed standing up from a chair using your arms (e.g., wheelchair or bedside chair)?: A Little Help needed to walk in hospital room?: Total Help needed climbing 3-5 steps with a railing? : Total 6 Click Score: 14    End of Session   Activity Tolerance: Patient tolerated treatment well Patient left: in bed;with call bell/phone within reach;with bed alarm set (sitter cam in place) Nurse Communication: Mobility status PT Visit Diagnosis: Unsteadiness on feet (R26.81);Muscle weakness (generalized) (M62.81);History of falling (Z91.81);Repeated falls (R29.6);Difficulty in walking, not elsewhere classified (R26.2)     Time: 4098-1191 PT Time Calculation (min) (ACUTE ONLY): 23 min  Charges:  $Gait Training: 8-22 mins $Therapeutic Activity: 8-22 mins                      Zenaida Niece, PT, DPT Acute Rehabilitation Pager: [phone] Office [place] 11/04/2021, 2:08 PM

## 2021-11-04 NOTE — Progress Notes (Signed)
Pt is confused and keep trying to get out of the bed. Tele monitor at bedside tried to redirect pt multiple times unsuccessfully. Made provider on call. Posey belt has been ordered. When this RN and another RN tried to apply belt on, pt stated "I'm going to lay down, don't restraint me!" Pt laid down on the bed. Bed alarm is on. Continued the use of tele monitor for now and will monitor pt closely.

## 2021-11-04 NOTE — Progress Notes (Signed)
[facility] Progress Note     Assessment/ Plan:   1) Patellar fracture: In immobilizer.  Management per primary team   2) ESRD: previously at [facility] now [facility] MWF schedule for now, next HD 11/06/21  3) Hypertension/volume: Blood pressure fairly high but overall improved.  Volume removal with dialysis.      4) Anemia secondary to ESRD: Hgb 7.3, may need transfusion soon. Iron sat 7 and ferritin 742.  IV iron ordered; will hold 2 doses if transfusion is required.  Aranesp 137mcg given on Monday 1/2. Next dose on Monday 11/02/21.   5) secondary hyperparathyroidism: Continue PhosLo.  PTH 429. Phos 1/5 5.3.   6) Diabetes Mellitus Type 2: Continue management per primary team   7) Gangrenous right toe: Related to peripheral arterial disease.  Vascular surgery following -> s/p  1/6 rt ilio/fem endarterectomy and  rt GT amputation w/ good capillary bleeding but unable to intervene on rt SFA/pop lesion.  Subjective:    Did OK with HD today.  Reports R groin tender.     Objective:   BP (!) 147/80 (BP Location: Right Arm)    Pulse 72    Temp 98.2 F (36.8 C) (Oral)    Resp 17    Ht 5\' 1"  (1.549 m)    Wt 53.1 kg    SpO2 100%    BMI 22.12 kg/m   Intake/Output Summary (Last 24 hours) at 11/04/2021 1715 Last data filed at 11/04/2021 1147 Gross per 24 hour  Intake --  Output 500 ml  Net -500 ml   Weight change:   Physical Exam: Constitutional: chronically ill appearing, NAD HEENT: R eye clouded CV: normal rate, no edema Respiratory: clear bilaterally Gastrointestinal: SNDNT + BS Skin: s/p amputation of right toe, dressed, R groin hematoma Access: L AVF, + T/B    Imaging: No results found.  Labs: BMET Recent Labs  Lab 10/29/21 0252 10/30/21 0453 10/31/21 0830 11/02/21 0222 11/04/21 0059  NA 132* 135 136 132* 133*  K 4.0 4.1 4.8 4.4 4.0  CL 98 96* 99 92* 91*  CO2 26 26 23 27 31   GLUCOSE 111* 109* 83 89 98  BUN 18 29* 41* 34* 30*   CREATININE 6.35* 8.89* 10.52* 7.99* 6.99*  CALCIUM 9.5 9.9 9.7 10.3 9.9  PHOS  --   --  6.2*  --  4.2   CBC Recent Labs  Lab 10/29/21 0252 10/30/21 0453 10/31/21 0830 10/31/21 1432 11/01/21 0159 11/02/21 0222 11/04/21 0059  WBC 12.7*   < > 15.7*  --  14.9* 13.6* 13.7*  NEUTROABS 8.0*  --   --   --  9.7*  --   --   HGB 8.3*   < > 6.7* 8.6* 8.7* 8.4* 9.2*  HCT 27.3*   < > 21.7* 25.4* 27.5* 27.3* 29.8*  MCV 92.9   < > 95.2  --  90.8 92.2 92.0  PLT 362   < > 346  --  314 316 315   < > = values in this interval not displayed.    Medications:     sodium chloride   Intravenous Once   sodium chloride   Intravenous Once   amLODipine  10 mg Oral Daily   [START ON 11/05/2021] aspirin EC  81 mg Oral Daily   atorvastatin  80 mg Oral Daily   atropine  1 drop Both Eyes BID   buPROPion  150 mg Oral Daily   calcium acetate  2,001 mg  Oral TID WC   Chlorhexidine Gluconate Cloth  6 each Topical Q0600   darbepoetin (ARANESP) injection - DIALYSIS  100 mcg Intravenous Q Mon-HD   dorzolamide-timolol  1 drop Both Eyes BID   feeding supplement (NEPRO CARB STEADY)  237 mL Oral BID BM   gabapentin  300 mg Oral q1800   heparin  5,000 Units Subcutaneous Q12H   hydrALAZINE  100 mg Oral Q8H   insulin aspart  0-6 Units Subcutaneous TID WC   labetalol  200 mg Oral BID   lamoTRIgine  25 mg Oral Daily   latanoprost  1 drop Both Eyes QHS   losartan  75 mg Oral Daily   multivitamin  1 tablet Oral QHS   sodium chloride flush  3 mL Intravenous Q12H      Madelon Lips, MD 11/04/2021, 5:15 PM

## 2021-11-04 NOTE — Progress Notes (Signed)
Patient returned back to room from dialysis. Alert. Bed in lowest position. Tele sitter called and notified that patient is back in room. [phone])

## 2021-11-04 NOTE — Progress Notes (Signed)
OT Cancellation Note  Patient Details Name: ALSACE DOWD MRN: [id number] DOB: 1965/11/26   Cancelled Treatment:    Reason Eval/Treat Not Completed: Patient declined, no reason specified. Pt agitated at this time, wanting to rest. Declining therapy this afternoon, requesting therapy come back tomorrow. Will reattempt as schedule allows.  Lynnda Child, OTD, OTR/L Acute Rehab [phone]   Kaylyn Lim 11/04/2021, 4:00 PM

## 2021-11-05 DIAGNOSIS — S82091G Other fracture of right patella, subsequent encounter for closed fracture with delayed healing: Secondary | ICD-10-CM

## 2021-11-05 LAB — GLUCOSE, CAPILLARY
Glucose-Capillary: 92 mg/dL (ref 70–99)
Glucose-Capillary: 141 mg/dL — ABNORMAL HIGH (ref 70–99)
Glucose-Capillary: 120 mg/dL — ABNORMAL HIGH (ref 70–99)
Glucose-Capillary: 101 mg/dL — ABNORMAL HIGH (ref 70–99)

## 2021-11-05 MED ORDER — LOSARTAN POTASSIUM 50 MG PO TABS
100.0000 mg | ORAL_TABLET | Freq: Every day | ORAL | Status: DC
  Administered 2021-11-06: 100 mg via ORAL
  Filled 2021-11-05: qty 2

## 2021-11-05 MED ORDER — HYDROMORPHONE HCL 2 MG PO TABS
1.0000 mg | ORAL_TABLET | Freq: Four times a day (QID) | ORAL | Status: DC | PRN
  Administered 2021-11-05: 1 mg via ORAL
  Filled 2021-11-05: qty 1

## 2021-11-05 MED ORDER — GABAPENTIN 100 MG PO CAPS
100.0000 mg | ORAL_CAPSULE | Freq: Three times a day (TID) | ORAL | Status: DC
  Administered 2021-11-05 (×3): 100 mg via ORAL
  Filled 2021-11-05 (×3): qty 1

## 2021-11-05 NOTE — Progress Notes (Signed)
CSW spoke with Juliann Pulse at [facility] who confirms the facility can accept the patient once she is ready for discharge.  CSW will discuss patient's readiness for discharge with NP and update Juliann Pulse.  Madilyn Fireman, MSW, LCSW Transitions of Care   Clinical Social Worker II [phone]

## 2021-11-05 NOTE — Progress Notes (Signed)
Nutrition Follow-up  DOCUMENTATION CODES:   Not applicable  INTERVENTION:   Recommend liberalizing pt diet to regular. Messaged MD.  Discontinue Nepro Shake  Continue Renal MVI daily.  NUTRITION DIAGNOSIS:   Increased nutrient needs related to chronic illness, other (see comment) (ESRD on HD; knee fracture) as evidenced by estimated needs. - Ongoing  GOAL:   Patient will meet greater than or equal to 90% of their needs - Progressing   MONITOR:   PO intake, Supplement acceptance, Labs, I & O's  REASON FOR ASSESSMENT:   Consult Hip fracture protocol  ASSESSMENT:   56 y.o. female with PMH significant of ESRD on HD, HTN, normocytic anemia 2/2 CKD, COPD, blindness, and tobacco abuse admitted with R knee patella nondisplaced fracture.  01/06 - R great toe amputation  Pt sleeping did not wake to RD voice. Spoke with beside sitter.   Sitter reports that she has been doing good. Reports that she has been sleeping and has not been awake enough to even eat. RD observed untouched lunch tray at bedside.   Per EMR, pt intake includes 01/07: Dinner 100% 01/08: Breakfast 100%, Lunch 50% 01/09: Breakfast 50%, Lunch 25% 01/12: Breakfast 100%  Sitter unsure of ONS consumption, states not seeing any today. RN confirmed that pt is not drinking Nepro.   Medications reviewed and include: Phoslo, Aranesp, SSI 0-6 units TID, Rena-Vit Labs reviewed: Sodium 133, 24 hr CBG 92-128  HD on 01/11: EDW: 54 kg per pt Net UF: 500 mL  Diet Order:   Diet Order             Diet heart healthy/carb modified Room service appropriate? Yes; Fluid consistency: Thin  Diet effective now                   EDUCATION NEEDS:   No education needs have been identified at this time  Skin:  Skin Assessment: Reviewed RN Assessment  Last BM:  10/24/2021  Height:   Ht Readings from Last 1 Encounters:  10/27/21 5\' 1"  (1.549 m)    Weight:   Wt Readings from Last 1 Encounters:  11/04/21 53.1  kg    BMI:  Body mass index is 22.12 kg/m.  Estimated Nutritional Needs:   Kcal:  1700-1900  Protein:  85-95 grams  Fluid:  1L+UOP   Tammy Moses, RD, LDN Clinical Dietitian See [facility] for contact information.

## 2021-11-05 NOTE — Progress Notes (Signed)
Occupational Therapy Treatment Patient Details Name: Tammy Moses MRN: [id number] DOB: 1966-07-08 Today's Date: 11/05/2021   History of present illness Pt is a 56 y/o female presenting to the ED on 12/28 secondary to fall. Found to have R patellar fx. Pt also with gangrene of R great toe, s/p femoral endarterectomy and percutaneous intervention 1/6. PMH includes ESRD on HD, HTN, DM, COPD, blindness, tobacco abuse.   OT comments  Pt with highly focused on her R LE pain, agreeable only to bed level grooming activities despite encouragement. Sitter at bedside.    Recommendations for follow up therapy are one component of a multi-disciplinary discharge planning process, led by the attending physician.  Recommendations may be updated based on patient status, additional functional criteria and insurance authorization.    Follow Up Recommendations  Skilled nursing-short term rehab (<3 hours/day)    Assistance Recommended at Discharge Frequent or constant Supervision/Assistance  Patient can return home with the following  A lot of help with walking and/or transfers;Two people to help with walking and/or transfers;A lot of help with bathing/dressing/bathroom;Assistance with cooking/housework;Help with stairs or ramp for entrance;Assist for transportation   Equipment Recommendations  BSC/3in1    Recommendations for Other Services      Precautions / Restrictions Precautions Precautions: Fall Precaution Comments: blind, R foot drop Required Braces or Orthoses: Knee Immobilizer - Right Knee Immobilizer - Right: On at all times Restrictions Weight Bearing Restrictions: Yes RLE Weight Bearing: Non weight bearing       Mobility Bed Mobility Overal bed mobility: Needs Assistance       Supine to sit: Supervision     General bed mobility comments: can bring trunk into flexion in long sitting, HOB up    Transfers                   General transfer comment: declined due to  pain     Balance                                           ADL either performed or assessed with clinical judgement   ADL Overall ADL's : Needs assistance/impaired     Grooming: Oral care;Wash/dry face;Bed level;Set up Grooming Details (indicate cue type and reason): pt declined EOB due to pain, agreed to bed level only                                    Extremity/Trunk Assessment              Vision       Perception     Praxis      Cognition Arousal/Alertness: Awake/alert Behavior During Therapy: Flat affect Overall Cognitive Status: No family/caregiver present to determine baseline cognitive functioning                                 General Comments: follows commands          Exercises     Shoulder Instructions       General Comments      Pertinent Vitals/ Pain       Pain Assessment: Faces Faces Pain Scale: Hurts whole lot Pain Location: R LE Pain Descriptors / Indicators: Moaning Pain Intervention(s): Monitored during session;Repositioned  Home Living                                          Prior Functioning/Environment              Frequency  Min 2X/week        Progress Toward Goals  OT Goals(current goals can now be found in the care plan section)  Progress towards OT goals: Not progressing toward goals - comment (limited by pain)  Acute Rehab OT Goals OT Goal Formulation: With patient Time For Goal Achievement: 11/19/21 Potential to Achieve Goals: Good ADL Goals Pt Will Perform Grooming: with supervision;with set-up;standing Pt Will Perform Upper Body Bathing: with set-up;with supervision;sitting Pt Will Perform Lower Body Bathing: with min assist;with min guard assist Pt Will Perform Upper Body Dressing: with supervision;with set-up;sitting Pt Will Perform Lower Body Dressing: with min assist;sit to/from stand Pt Will Transfer to Toilet: with min  assist;stand pivot transfer;bedside commode Pt Will Perform Toileting - Clothing Manipulation and hygiene: with min assist;sit to/from stand  Plan Discharge plan remains appropriate;Frequency remains appropriate    Co-evaluation                 AM-PAC OT "6 Clicks" Daily Activity     Outcome Measure   Help from another person eating meals?: None Help from another person taking care of personal grooming?: A Little Help from another person toileting, which includes using toliet, bedpan, or urinal?: A Lot Help from another person bathing (including washing, rinsing, drying)?: A Lot Help from another person to put on and taking off regular upper body clothing?: A Little Help from another person to put on and taking off regular lower body clothing?: Total 6 Click Score: 15    End of Session    OT Visit Diagnosis: Unsteadiness on feet (R26.81);Muscle weakness (generalized) (M62.81);Low vision, both eyes (H54.2)   Activity Tolerance Patient limited by pain   Patient Left in bed;with call bell/phone within reach;with nursing/sitter in room   Nurse Communication Patient requests pain meds        Time: 4403-4742 OT Time Calculation (min): 16 min  Charges: OT General Charges $OT Visit: 1 Visit OT Treatments $Self Care/Home Management : 8-22 mins  Malka So 11/05/2021, 10:59 AM Nestor Lewandowsky, OTR/L [place] Pager: [phone] Office: [phone]

## 2021-11-05 NOTE — Progress Notes (Signed)
[facility] PROGRESS NOTE  MIKU UDALL OQH:[id number] DOB: December 13, 1965 DOA: 10/21/2021 PCP: [name], FNP  Status: Remains inpatient appropriate because:  Unsafe discharge plan-has finally become medically stable so search for SNF bed initiated as of 1/12-located then will need to begin process of setting up outpatient hemodialysis  Barriers to discharge: Social: Recently at [facility] but had been discharged prior to this admission.  Current physical debility in context of regular dialysis and legally blind patient needs placement at SNF eventually short-term but likely for long-term care.  [facility] is also following this patient  Clinical: Patient has finally become medically stable with vascular team stating wound follow-up can be done in the outpatient setting and blood pressure is improved with adjustments in medications and will regular hemodialysis.  Does need to have outpatient dialysis seat secured in context of discharging SNF location-patient also remains significantly confused and needs 24/7 care  Level of care:  Med-Surg   Code Status: Full Family Communication: Patient only.  TOC team has been communicating with patient's DVT prophylaxis: Subcutaneous heparin COVID vaccination status: According to LCSW note/Greg Wierda patient has been fully vaccinated for COVID and has had 1 booster    HPI: 56 year old female with history of ESRD on dialysis MWF, hypertension, normocytic anemia, COPD, blindness, tobacco use who presented with after falling down on her right knee when she slipped on ice.  She was unable to stand up and complained of pain and swelling of right knee and was brought to the emergency department.  Imaging showed nondisplaced right patella fracture on x-ray.  Orthopedics was consulted and recommended right knee brace with PT and follow-up as an outpatient.  Patient was also found to have right toe gangrene, ABI showed severe peripheral artery disease.   Vascular surgery following, underwent  left lower extremity arteriography on 1/3 with finding of severe infrainguinal occlusive disease bilaterally. She underwent right great toe amputation, right iliofemoral endarterectomy with vein patch angioplasty on 1/6. Hospitalization complicated by delirium. PT/OT recommending SNF on DC  Subjective: Patient alert but very confused.  During orientation questioning in regards to location patient stated she was in [employer] and stated the year was 72.  She was oriented to her name.  When I asked her if she had served in the [employer] she stated no but her father had.  She was also reporting operative site pain.  Objective: Vitals:   11/04/21 2039 11/05/21 0419  BP: (!) 193/67 (!) 125/47  Pulse: 80 72  Resp: 16 17  Temp: 98.7 F (37.1 C) 98.4 F (36.9 C)  SpO2: 99% 100%    Intake/Output Summary (Last 24 hours) at 11/05/2021 0835 Last data filed at 11/04/2021 1147 Gross per 24 hour  Intake --  Output 500 ml  Net -500 ml   Filed Weights   11/02/21 1330 11/02/21 1715 11/04/21 0800  Weight: 54.4 kg 52 kg 53.1 kg    Exam:  Constitutional: NAD, calm, comfortable reporting issues overnight with operative site pain Respiratory: clear to auscultation bilaterally, no wheezing, no crackles. Normal respiratory effort. No accessory muscle use.  Cardiovascular: Regular rate and rhythm, no murmurs / rubs / gallops. No extremity edema.  Abdomen: no tenderness, no masses palpated. No hepatosplenomegaly. Bowel sounds positive.  Skin: no rashes, lesions, ulcers. No induration.  Dressing over right great toe amputation site Neurologic: CN 2-12 grossly intact. Sensation intact, Strength 4/5 x all 4 extremities.  Psychiatric: Oriented times name only   Assessment/Plan: Acute problems: Closed, nondisplaced  fracture of right patella: Due to fall.  - Orthopedics recommended knee immobilizer and outpatient follow up.  - Continue PT/OT     Right great toe  gangrene, severe PAD: ABI showed severe peripheral artery disease.   CT angiogram showed severe atherosclerotic disease throughout the abdomen, pelvis and bilateral lower extremities,severe bilateral outflow disease . - s/p R iliofemoral endarterectomy with toe amp with unsuccessful revascularization of SFA/pop lesion with [name] on 10/30/2020.  - Note high risk of not healing amputation wound, would require more proximal amputation.  Per vascular team okay to follow wound in the outpatient setting - R heel weight bearing in darco shoe - Pt on high-intensity statin the aspirin -large hematoma in right groi vascular team aware but states hematoma is soft and currently not causing any issues -Patient reporting inadequate pain control therefore have changed Neurontin 300 at bedtime to 100 TID, have discontinued Vicodin in favor of Dilaudid every 6 hours as needed   ESRD:  - Continue HD per nephrology routinely -Bed offer at [place] will need to have locally available outpatient HD arranged prior to discharge   Diabetes mellitus 2 on short acting insulin (POA) related to severe chronic kidney disease:  Recently well-controlled with HbA1c 5.9%.  - Continue SSI    Anemia of chronic disease, iron deficiency, and acute blood loss anemia:  - s/p IV iron - Aranesop per nephrology  - Transfuse prn, required 1u PRBCs on 1/7- suspect due to large hematoma of groin   Hypertension: - Continue norvasc, labetalol, losartan.     Acute delirium:  Worsened by recent surgery, acute pain and anesthesia.  - Delirium precautions.    Debility/deconditioning:  PT/OT recommending skilled nursing facility on discharge.  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  Social worker following.      Data Reviewed: Basic Metabolic Panel: Recent Labs  Lab 10/30/21 0453 10/31/21 0830 11/02/21 0222 11/04/21 0059  NA 135 136 132* 133*  K 4.1 4.8 4.4 4.0  CL 96* 99 92* 91*  CO2 26 23 27 31   GLUCOSE 109*  83 89 98  BUN 29* 41* 34* 30*  CREATININE 8.89* 10.52* 7.99* 6.99*  CALCIUM 9.9 9.7 10.3 9.9  PHOS  --  6.2*  --  4.2   Liver Function Tests: Recent Labs  Lab 10/31/21 0830 11/04/21 0059  ALBUMIN 3.0* 2.8*   CBC: Recent Labs  Lab 10/30/21 0453 10/31/21 0830 10/31/21 1432 11/01/21 0159 11/02/21 0222 11/04/21 0059  WBC 12.5* 15.7*  --  14.9* 13.6* 13.7*  NEUTROABS  --   --   --  9.7*  --   --   HGB 8.4* 6.7* 8.6* 8.7* 8.4* 9.2*  HCT 28.0* 21.7* 25.4* 27.5* 27.3* 29.8*  MCV 94.0 95.2  --  90.8 92.2 92.0  PLT 390 346  --  314 316 315    BNP (last 3 results) Recent Labs    09/16/21 1405 09/30/21 1055 09/30/21 2118  BNP 3,461.4* 3,585.7* 4,037.8*      CBG: Recent Labs  Lab 11/03/21 1621 11/03/21 2237 11/04/21 1236 11/04/21 1644 11/04/21 2049  GLUCAP 138* 159* 128* 119* 125*    Recent Results (from the past 240 hour(s))  Surgical pcr screen     Status: None   Collection Time: 10/30/21  3:10 AM   Specimen: Nasal Mucosa; Nasal Swab  Result Value Ref Range Status   MRSA, PCR NEGATIVE NEGATIVE Final   Staphylococcus aureus NEGATIVE NEGATIVE Final    Comment: (NOTE) The  Xpert SA Assay (FDA approved for NASAL specimens in patients 53 years of age and older), is one component of a comprehensive surveillance program. It is not intended to diagnose infection nor to guide or monitor treatment. Performed at [facility], [place] [address], [place], [place] [address]      \  Scheduled Meds:  sodium chloride   Intravenous Once   sodium chloride   Intravenous Once   amLODipine  10 mg Oral Daily   aspirin EC  81 mg Oral Daily   atorvastatin  80 mg Oral Daily   atropine  1 drop Both Eyes BID   buPROPion  150 mg Oral Daily   calcium acetate  2,001 mg Oral TID WC   Chlorhexidine Gluconate Cloth  6 each Topical Q0600   darbepoetin (ARANESP) injection - DIALYSIS  100 mcg Intravenous Q Mon-HD   dorzolamide-timolol  1 drop Both Eyes BID   feeding supplement  (NEPRO CARB STEADY)  237 mL Oral BID BM   gabapentin  100 mg Oral TID   heparin  5,000 Units Subcutaneous Q12H   hydrALAZINE  100 mg Oral Q8H   insulin aspart  0-6 Units Subcutaneous TID WC   labetalol  200 mg Oral BID   lamoTRIgine  25 mg Oral Daily   latanoprost  1 drop Both Eyes QHS   losartan  75 mg Oral Daily   multivitamin  1 tablet Oral QHS   sodium chloride flush  3 mL Intravenous Q12H   Continuous Infusions:  sodium chloride      Principal Problem:   Closed patellar sleeve fracture of right knee Active Problems:   Patellar sleeve fracture of right knee, closed, initial encounter   Gangrene of foot [facility])   Consultants: Cardiology Nephrology Vascular surgery Interventional radiology  Procedures: Echocardiogram Abdominal aortogram with lower extremity runoff Right iliofemoral endarterectomy with vein patch angioplasty, right great toe amputation including metatarsal head, failed angioplasty of right SFA and popliteal artery, right lower extremity angiogram  Antibiotics: Cefazolin x1 on 1/6   Time spent: 45-minute    Erin Hearing ANP  [facility] 7 am - 330 pm/M-F for direct patient care and secure chat Please refer to Amion for contact info 13  days

## 2021-11-05 NOTE — Progress Notes (Addendum)
°  Progress Note    11/05/2021 7:47 AM 6 Days Post-Op  Subjective:  very somnolent but when she was awake momentarily said she was not having any pain in foot but then would intermittently yell out in pain   Vitals:   11/04/21 2039 11/05/21 0419  BP: (!) 193/67 (!) 125/47  Pulse: 80 72  Resp: 16 17  Temp: 98.7 F (37.1 C) 98.4 F (36.9 C)  SpO2: 99% 100%   Physical Exam: Cardiac:  regular Lungs:  non labored Incisions: right groin healing Extremities:  right foot warm. Monophasic peroneal signal  Neurologic: alert and oriented  CBC    Component Value Date/Time   WBC 13.7 (H) 11/04/2021 0059   RBC 3.24 (L) 11/04/2021 0059   HGB 9.2 (L) 11/04/2021 0059   HGB 12.4 12/13/2017 1416   HCT 29.8 (L) 11/04/2021 0059   HCT 37.8 12/13/2017 1416   PLT 315 11/04/2021 0059   PLT 254 12/13/2017 1416   MCV 92.0 11/04/2021 0059   MCV 83 12/13/2017 1416   MCH 28.4 11/04/2021 0059   MCHC 30.9 11/04/2021 0059   RDW 17.3 (H) 11/04/2021 0059   RDW 15.8 (H) 12/13/2017 1416   LYMPHSABS 2.5 11/01/2021 0159   LYMPHSABS 4.1 (H) 12/13/2017 1416   MONOABS 2.3 (H) 11/01/2021 0159   EOSABS 0.3 11/01/2021 0159   EOSABS 0.3 12/13/2017 1416   BASOSABS 0.1 11/01/2021 0159   BASOSABS 0.1 12/13/2017 1416    BMET    Component Value Date/Time   NA 133 (L) 11/04/2021 0059   NA 146 (H) 05/10/2017 0950   K 4.0 11/04/2021 0059   CL 91 (L) 11/04/2021 0059   CO2 31 11/04/2021 0059   GLUCOSE 98 11/04/2021 0059   BUN 30 (H) 11/04/2021 0059   BUN 38 (H) 05/10/2017 0950   CREATININE 6.99 (H) 11/04/2021 0059   CREATININE 3.40 (H) 03/11/2017 1505   CALCIUM 9.9 11/04/2021 0059   GFRNONAA 6 (L) 11/04/2021 0059   GFRNONAA 15 (L) 03/11/2017 1505   GFRAA 5 (L) 05/05/2020 1314   GFRAA 17 (L) 03/11/2017 1505    INR    Component Value Date/Time   INR 1.2 10/21/2021 1425     Intake/Output Summary (Last 24 hours) at 11/05/2021 0747 Last data filed at 11/04/2021 1147 Gross per 24 hour  Intake --   Output 500 ml  Net -500 ml     Assessment/Plan:  56 y.o. female is s/p  Right iliofemoral endarterectomy with vein patch angioplasty, right great toe amputation and failed angioplasty right SFA and popliteal artery    Right groin is healing well. Intact. Hematoma but soft Right Great toe amp incision is intact. Duskiness of skin edges otherwise stable Monophasic peroneal signal Will continue to monitor foot for healing Weight bearing only with Darco shoe Continue Asa + Statin    Tammy Caldwell, PA-C [facility] [phone] 11/05/2021  I agree with the above.  Will need to closely monitor toe amp site.  This can be managed as an outpatient  Annamarie Major

## 2021-11-05 NOTE — Progress Notes (Signed)
Sitter at bedside.

## 2021-11-05 NOTE — Progress Notes (Signed)
PT Cancellation Note  Patient Details Name: Tammy Moses MRN: [id number] DOB: [birth year]   Cancelled Treatment:    Reason Eval/Treat Not Completed: Other (comment).  Declined therapy including bed ex, no reason given.  Sitter reports she was up today, recently back to bed.   Ramond Dial 11/05/2021, 5:10 PM  [name], PT PhD Acute Rehab Dept. Number: [place] [facility] [place]

## 2021-11-05 NOTE — Progress Notes (Signed)
[facility] Progress Note     Assessment/ Plan:   1) Patellar fracture: In immobilizer.  Management per primary team   2) ESRD: previously at [facility] now [place] MWF schedule for now, next HD 11/06/21  3) Hypertension/volume: Blood pressure fairly high but overall improved.  Volume removal with dialysis. I increased her losartan to 100 mg daily      4) Anemia secondary to ESRD: Hgb 7.3, may need transfusion soon. Iron sat 7 and ferritin 742.  s/p IV Iron Aranesp 161mcg    5) secondary hyperparathyroidism: Continue PhosLo.  PTH 429. Phos 1/5 5.3.   6) Diabetes Mellitus Type 2: Continue management per primary team   7) Gangrenous right toe: Related to peripheral arterial disease.  Vascular surgery following -> s/p  1/6 rt ilio/fem endarterectomy and  rt GT amputation w/ good capillary bleeding but unable to intervene on rt SFA/pop lesion.  8.  Delirium- consider decreasing gabapentin to 100 QHS only  Subjective:    HD yesterday, 500 mL UOP.  Delirious this AM- says that she "[place] to go to the wilderness", referring to camping.  Still pretty hypertensive   Objective:   BP (!) 161/61 (BP Location: Right Arm)    Pulse 81    Temp 98.7 F (37.1 C) (Oral)    Resp 18    Ht 5\' 1"  (1.549 m)    Wt 53.1 kg    SpO2 98%    BMI 22.12 kg/m   Intake/Output Summary (Last 24 hours) at 11/05/2021 1113 Last data filed at 11/04/2021 1147 Gross per 24 hour  Intake --  Output 500 ml  Net -500 ml   Weight change:   Physical Exam: Constitutional: chronically ill appearing, NAD HEENT: R eye clouded CV: normal rate, no edema Respiratory: clear bilaterally Gastrointestinal: SNDNT + BS Skin: s/p amputation of right toe, dressed, R groin hematoma Access: L AVF, + T/B    Imaging: No results found.  Labs: BMET Recent Labs  Lab 10/30/21 0453 10/31/21 0830 11/02/21 0222 11/04/21 0059  NA 135 136 132* 133*  K 4.1 4.8 4.4 4.0  CL 96* 99 92* 91*  CO2  26 23 27 31   GLUCOSE 109* 83 89 98  BUN 29* 41* 34* 30*  CREATININE 8.89* 10.52* 7.99* 6.99*  CALCIUM 9.9 9.7 10.3 9.9  PHOS  --  6.2*  --  4.2   CBC Recent Labs  Lab 10/31/21 0830 10/31/21 1432 11/01/21 0159 11/02/21 0222 11/04/21 0059  WBC 15.7*  --  14.9* 13.6* 13.7*  NEUTROABS  --   --  9.7*  --   --   HGB 6.7* 8.6* 8.7* 8.4* 9.2*  HCT 21.7* 25.4* 27.5* 27.3* 29.8*  MCV 95.2  --  90.8 92.2 92.0  PLT 346  --  314 316 315    Medications:     sodium chloride   Intravenous Once   sodium chloride   Intravenous Once   amLODipine  10 mg Oral Daily   aspirin EC  81 mg Oral Daily   atorvastatin  80 mg Oral Daily   atropine  1 drop Both Eyes BID   buPROPion  150 mg Oral Daily   calcium acetate  2,001 mg Oral TID WC   Chlorhexidine Gluconate Cloth  6 each Topical Q0600   darbepoetin (ARANESP) injection - DIALYSIS  100 mcg Intravenous Q Mon-HD   dorzolamide-timolol  1 drop Both Eyes BID   feeding supplement (NEPRO CARB STEADY)  237  mL Oral BID BM   gabapentin  100 mg Oral TID   heparin  5,000 Units Subcutaneous Q12H   hydrALAZINE  100 mg Oral Q8H   insulin aspart  0-6 Units Subcutaneous TID WC   labetalol  200 mg Oral BID   lamoTRIgine  25 mg Oral Daily   latanoprost  1 drop Both Eyes QHS   losartan  75 mg Oral Daily   multivitamin  1 tablet Oral QHS   sodium chloride flush  3 mL Intravenous Q12H      Tammy Lips, MD 11/05/2021, 11:13 AM

## 2021-11-06 ENCOUNTER — Other Ambulatory Visit (HOSPITAL_COMMUNITY): Payer: Self-pay

## 2021-11-06 DIAGNOSIS — H5461 Unqualified visual loss, right eye, normal vision left eye: Secondary | ICD-10-CM | POA: Diagnosis not present

## 2021-11-06 DIAGNOSIS — M869 Osteomyelitis, unspecified: Secondary | ICD-10-CM | POA: Diagnosis not present

## 2021-11-06 DIAGNOSIS — G9009 Other idiopathic peripheral autonomic neuropathy: Secondary | ICD-10-CM | POA: Diagnosis not present

## 2021-11-06 DIAGNOSIS — E559 Vitamin D deficiency, unspecified: Secondary | ICD-10-CM | POA: Diagnosis not present

## 2021-11-06 DIAGNOSIS — S98111A Complete traumatic amputation of right great toe, initial encounter: Secondary | ICD-10-CM | POA: Diagnosis not present

## 2021-11-06 DIAGNOSIS — D631 Anemia in chronic kidney disease: Secondary | ICD-10-CM | POA: Diagnosis not present

## 2021-11-06 DIAGNOSIS — I5032 Chronic diastolic (congestive) heart failure: Secondary | ICD-10-CM | POA: Diagnosis not present

## 2021-11-06 DIAGNOSIS — R410 Disorientation, unspecified: Secondary | ICD-10-CM | POA: Diagnosis present

## 2021-11-06 DIAGNOSIS — E119 Type 2 diabetes mellitus without complications: Secondary | ICD-10-CM | POA: Diagnosis not present

## 2021-11-06 DIAGNOSIS — F32A Depression, unspecified: Secondary | ICD-10-CM | POA: Diagnosis not present

## 2021-11-06 DIAGNOSIS — E1140 Type 2 diabetes mellitus with diabetic neuropathy, unspecified: Secondary | ICD-10-CM | POA: Diagnosis present

## 2021-11-06 DIAGNOSIS — I959 Hypotension, unspecified: Secondary | ICD-10-CM | POA: Diagnosis not present

## 2021-11-06 DIAGNOSIS — I120 Hypertensive chronic kidney disease with stage 5 chronic kidney disease or end stage renal disease: Secondary | ICD-10-CM | POA: Diagnosis not present

## 2021-11-06 DIAGNOSIS — E1151 Type 2 diabetes mellitus with diabetic peripheral angiopathy without gangrene: Secondary | ICD-10-CM | POA: Diagnosis not present

## 2021-11-06 DIAGNOSIS — L7682 Other postprocedural complications of skin and subcutaneous tissue: Secondary | ICD-10-CM | POA: Diagnosis present

## 2021-11-06 DIAGNOSIS — Z833 Family history of diabetes mellitus: Secondary | ICD-10-CM | POA: Diagnosis not present

## 2021-11-06 DIAGNOSIS — Z20822 Contact with and (suspected) exposure to covid-19: Secondary | ICD-10-CM | POA: Diagnosis not present

## 2021-11-06 DIAGNOSIS — Z743 Need for continuous supervision: Secondary | ICD-10-CM | POA: Diagnosis not present

## 2021-11-06 DIAGNOSIS — T8781 Dehiscence of amputation stump: Secondary | ICD-10-CM | POA: Diagnosis not present

## 2021-11-06 DIAGNOSIS — E1122 Type 2 diabetes mellitus with diabetic chronic kidney disease: Secondary | ICD-10-CM

## 2021-11-06 DIAGNOSIS — R6889 Other general symptoms and signs: Secondary | ICD-10-CM | POA: Diagnosis not present

## 2021-11-06 DIAGNOSIS — N2581 Secondary hyperparathyroidism of renal origin: Secondary | ICD-10-CM | POA: Diagnosis not present

## 2021-11-06 DIAGNOSIS — I739 Peripheral vascular disease, unspecified: Secondary | ICD-10-CM | POA: Diagnosis present

## 2021-11-06 DIAGNOSIS — E11319 Type 2 diabetes mellitus with unspecified diabetic retinopathy without macular edema: Secondary | ICD-10-CM | POA: Diagnosis present

## 2021-11-06 DIAGNOSIS — M21371 Foot drop, right foot: Secondary | ICD-10-CM | POA: Diagnosis not present

## 2021-11-06 DIAGNOSIS — Z8349 Family history of other endocrine, nutritional and metabolic diseases: Secondary | ICD-10-CM | POA: Diagnosis not present

## 2021-11-06 DIAGNOSIS — E43 Unspecified severe protein-calorie malnutrition: Secondary | ICD-10-CM | POA: Diagnosis not present

## 2021-11-06 DIAGNOSIS — S82001D Unspecified fracture of right patella, subsequent encounter for closed fracture with routine healing: Secondary | ICD-10-CM | POA: Diagnosis not present

## 2021-11-06 DIAGNOSIS — Z7401 Bed confinement status: Secondary | ICD-10-CM | POA: Diagnosis not present

## 2021-11-06 DIAGNOSIS — W19XXXD Unspecified fall, subsequent encounter: Secondary | ICD-10-CM | POA: Diagnosis not present

## 2021-11-06 DIAGNOSIS — S98139A Complete traumatic amputation of one unspecified lesser toe, initial encounter: Secondary | ICD-10-CM | POA: Diagnosis not present

## 2021-11-06 DIAGNOSIS — R5381 Other malaise: Secondary | ICD-10-CM | POA: Clinically undetermined

## 2021-11-06 DIAGNOSIS — R29898 Other symptoms and signs involving the musculoskeletal system: Secondary | ICD-10-CM | POA: Diagnosis not present

## 2021-11-06 DIAGNOSIS — Z992 Dependence on renal dialysis: Secondary | ICD-10-CM | POA: Diagnosis not present

## 2021-11-06 DIAGNOSIS — Z79899 Other long term (current) drug therapy: Secondary | ICD-10-CM | POA: Diagnosis not present

## 2021-11-06 DIAGNOSIS — S82091G Other fracture of right patella, subsequent encounter for closed fracture with delayed healing: Secondary | ICD-10-CM | POA: Diagnosis not present

## 2021-11-06 DIAGNOSIS — I898 Other specified noninfective disorders of lymphatic vessels and lymph nodes: Secondary | ICD-10-CM | POA: Diagnosis not present

## 2021-11-06 DIAGNOSIS — E11621 Type 2 diabetes mellitus with foot ulcer: Secondary | ICD-10-CM | POA: Diagnosis not present

## 2021-11-06 DIAGNOSIS — Z794 Long term (current) use of insulin: Secondary | ICD-10-CM | POA: Diagnosis not present

## 2021-11-06 DIAGNOSIS — S82091A Other fracture of right patella, initial encounter for closed fracture: Secondary | ICD-10-CM | POA: Diagnosis not present

## 2021-11-06 DIAGNOSIS — D509 Iron deficiency anemia, unspecified: Secondary | ICD-10-CM | POA: Diagnosis not present

## 2021-11-06 DIAGNOSIS — N250 Renal osteodystrophy: Secondary | ICD-10-CM | POA: Diagnosis not present

## 2021-11-06 DIAGNOSIS — W19XXXA Unspecified fall, initial encounter: Secondary | ICD-10-CM | POA: Diagnosis not present

## 2021-11-06 DIAGNOSIS — F17210 Nicotine dependence, cigarettes, uncomplicated: Secondary | ICD-10-CM | POA: Diagnosis not present

## 2021-11-06 DIAGNOSIS — E1152 Type 2 diabetes mellitus with diabetic peripheral angiopathy with gangrene: Secondary | ICD-10-CM | POA: Diagnosis not present

## 2021-11-06 DIAGNOSIS — B182 Chronic viral hepatitis C: Secondary | ICD-10-CM | POA: Diagnosis present

## 2021-11-06 DIAGNOSIS — H44511 Absolute glaucoma, right eye: Secondary | ICD-10-CM | POA: Diagnosis not present

## 2021-11-06 DIAGNOSIS — L819 Disorder of pigmentation, unspecified: Secondary | ICD-10-CM | POA: Diagnosis not present

## 2021-11-06 DIAGNOSIS — J449 Chronic obstructive pulmonary disease, unspecified: Secondary | ICD-10-CM | POA: Diagnosis not present

## 2021-11-06 DIAGNOSIS — I70235 Atherosclerosis of native arteries of right leg with ulceration of other part of foot: Secondary | ICD-10-CM | POA: Diagnosis not present

## 2021-11-06 DIAGNOSIS — E785 Hyperlipidemia, unspecified: Secondary | ICD-10-CM | POA: Diagnosis present

## 2021-11-06 DIAGNOSIS — Z89411 Acquired absence of right great toe: Secondary | ICD-10-CM | POA: Diagnosis not present

## 2021-11-06 DIAGNOSIS — N186 End stage renal disease: Secondary | ICD-10-CM | POA: Diagnosis not present

## 2021-11-06 DIAGNOSIS — I10 Essential (primary) hypertension: Secondary | ICD-10-CM | POA: Diagnosis not present

## 2021-11-06 DIAGNOSIS — S82009A Unspecified fracture of unspecified patella, initial encounter for closed fracture: Secondary | ICD-10-CM | POA: Diagnosis not present

## 2021-11-06 DIAGNOSIS — H402223 Chronic angle-closure glaucoma, left eye, severe stage: Secondary | ICD-10-CM | POA: Diagnosis not present

## 2021-11-06 DIAGNOSIS — T8131XA Disruption of external operation (surgical) wound, not elsewhere classified, initial encounter: Secondary | ICD-10-CM | POA: Diagnosis not present

## 2021-11-06 DIAGNOSIS — L7622 Postprocedural hemorrhage and hematoma of skin and subcutaneous tissue following other procedure: Secondary | ICD-10-CM | POA: Diagnosis not present

## 2021-11-06 DIAGNOSIS — M898X9 Other specified disorders of bone, unspecified site: Secondary | ICD-10-CM | POA: Diagnosis not present

## 2021-11-06 DIAGNOSIS — S82091D Other fracture of right patella, subsequent encounter for closed fracture with routine healing: Secondary | ICD-10-CM | POA: Diagnosis not present

## 2021-11-06 DIAGNOSIS — J810 Acute pulmonary edema: Secondary | ICD-10-CM | POA: Diagnosis not present

## 2021-11-06 DIAGNOSIS — I132 Hypertensive heart and chronic kidney disease with heart failure and with stage 5 chronic kidney disease, or end stage renal disease: Secondary | ICD-10-CM | POA: Diagnosis not present

## 2021-11-06 DIAGNOSIS — Z7982 Long term (current) use of aspirin: Secondary | ICD-10-CM | POA: Diagnosis not present

## 2021-11-06 LAB — CBC
HCT: 29.5 % — ABNORMAL LOW (ref 36.0–46.0)
Hemoglobin: 8.9 g/dL — ABNORMAL LOW (ref 12.0–15.0)
MCH: 28.1 pg (ref 26.0–34.0)
MCHC: 30.2 g/dL (ref 30.0–36.0)
MCV: 93.1 fL (ref 80.0–100.0)
Platelets: 362 10*3/uL (ref 150–400)
RBC: 3.17 MIL/uL — ABNORMAL LOW (ref 3.87–5.11)
RDW: 17.2 % — ABNORMAL HIGH (ref 11.5–15.5)
WBC: 15.9 10*3/uL — ABNORMAL HIGH (ref 4.0–10.5)
nRBC: 0 % (ref 0.0–0.2)

## 2021-11-06 LAB — RENAL FUNCTION PANEL
Albumin: 2.9 g/dL — ABNORMAL LOW (ref 3.5–5.0)
Anion gap: 15 (ref 5–15)
BUN: 31 mg/dL — ABNORMAL HIGH (ref 6–20)
CO2: 25 mmol/L (ref 22–32)
Calcium: 10 mg/dL (ref 8.9–10.3)
Chloride: 96 mmol/L — ABNORMAL LOW (ref 98–111)
Creatinine, Ser: 8.29 mg/dL — ABNORMAL HIGH (ref 0.44–1.00)
GFR, Estimated: 5 mL/min — ABNORMAL LOW (ref >60)
Glucose, Bld: 144 mg/dL — ABNORMAL HIGH (ref 70–99)
Phosphorus: 5.5 mg/dL — ABNORMAL HIGH (ref 2.5–4.6)
Potassium: 4 mmol/L (ref 3.5–5.1)
Sodium: 136 mmol/L (ref 135–145)

## 2021-11-06 LAB — GLUCOSE, CAPILLARY
Glucose-Capillary: 85 mg/dL (ref 70–99)
Glucose-Capillary: 155 mg/dL — ABNORMAL HIGH (ref 70–99)

## 2021-11-06 MED ORDER — RENA-VITE PO TABS: 1.0000 | ORAL_TABLET | Freq: Every day | ORAL | 0 refills | Status: DC

## 2021-11-06 MED ORDER — PREGABALIN 25 MG PO CAPS: 25.0000 mg | ORAL_CAPSULE | Freq: Every day | ORAL | 0 refills | Status: DC

## 2021-11-06 MED ORDER — INSULIN ASPART 100 UNIT/ML IJ SOLN: 0.0000 [IU] | Freq: Three times a day (TID) | INTRAMUSCULAR | 11 refills | Status: DC

## 2021-11-06 MED ORDER — HYDROMORPHONE HCL 2 MG PO TABS: 1.0000 mg | ORAL_TABLET | ORAL | 0 refills | Status: DC | PRN

## 2021-11-06 MED ORDER — DARBEPOETIN ALFA 100 MCG/0.5ML IJ SOSY: 100.0000 ug | PREFILLED_SYRINGE | INTRAMUSCULAR | Status: DC

## 2021-11-06 MED ORDER — SENNOSIDES-DOCUSATE SODIUM 8.6-50 MG PO TABS: 1.0000 | ORAL_TABLET | Freq: Every evening | ORAL | Status: AC | PRN

## 2021-11-06 MED ORDER — LABETALOL HCL 200 MG PO TABS: 200.0000 mg | ORAL_TABLET | Freq: Two times a day (BID) | ORAL | Status: DC

## 2021-11-06 MED ORDER — HYDROMORPHONE HCL 2 MG PO TABS
ORAL_TABLET | ORAL | Status: AC
  Filled 2021-11-06: qty 1

## 2021-11-06 MED ORDER — AMLODIPINE BESYLATE 10 MG PO TABS: 10.0000 mg | ORAL_TABLET | Freq: Every day | ORAL | Status: DC

## 2021-11-06 MED ORDER — ASPIRIN 81 MG PO TBEC: 81.0000 mg | DELAYED_RELEASE_TABLET | Freq: Every day | ORAL | 11 refills | Status: DC

## 2021-11-06 MED ORDER — LOSARTAN POTASSIUM 100 MG PO TABS: 100.0000 mg | ORAL_TABLET | Freq: Every day | ORAL | Status: DC

## 2021-11-06 MED ORDER — BUPROPION HCL ER (SR) 150 MG PO TB12: 150.0000 mg | ORAL_TABLET | Freq: Every day | ORAL | Status: DC

## 2021-11-06 MED ORDER — ACETAMINOPHEN 325 MG PO TABS: 650.0000 mg | ORAL_TABLET | ORAL | Status: AC | PRN

## 2021-11-06 MED ORDER — CALCIUM ACETATE (PHOS BINDER) 667 MG PO CAPS: 2001.0000 mg | ORAL_CAPSULE | Freq: Three times a day (TID) | ORAL | Status: DC

## 2021-11-06 MED ORDER — PREGABALIN 25 MG PO CAPS
25.0000 mg | ORAL_CAPSULE | Freq: Every day | ORAL | Status: DC
  Administered 2021-11-06: 25 mg via ORAL
  Filled 2021-11-06: qty 1

## 2021-11-06 MED ORDER — HYDROMORPHONE HCL 2 MG PO TABS
1.0000 mg | ORAL_TABLET | ORAL | Status: DC | PRN
  Administered 2021-11-06 (×2): 1 mg via ORAL
  Filled 2021-11-06: qty 1

## 2021-11-06 MED ORDER — ATORVASTATIN CALCIUM 80 MG PO TABS: 80.0000 mg | ORAL_TABLET | Freq: Every day | ORAL | Status: DC

## 2021-11-06 NOTE — Progress Notes (Signed)
RN called [facility] and gave report to [facility] and she stated understanding. IV has been removed and pt waiting for PTAR.

## 2021-11-06 NOTE — Progress Notes (Signed)
°  Progress Note    11/06/2021 8:28 AM 7 Days Post-Op  Subjective:  no real complaints   Vitals:   11/06/21 0608 11/06/21 0722  BP: (!) 124/43 (!) 160/44  Pulse: 72 73  Resp: 17 18  Temp: 97.7 F (36.5 C) 98.9 F (37.2 C)  SpO2: 100% 100%   Physical Exam: Cardiac:  regular Lungs:  non labored Incisions:  right groin incision c/d/I, large area of fullness, mostly soft. Likely large seroma. Right first toe amp site unchanged, some duskiness along skin edges. No drainage   Extremities:  well perfused.  Neurologic: alert and oriented  CBC    Component Value Date/Time   WBC 13.7 (H) 11/04/2021 0059   RBC 3.24 (L) 11/04/2021 0059   HGB 9.2 (L) 11/04/2021 0059   HGB 12.4 12/13/2017 1416   HCT 29.8 (L) 11/04/2021 0059   HCT 37.8 12/13/2017 1416   PLT 315 11/04/2021 0059   PLT 254 12/13/2017 1416   MCV 92.0 11/04/2021 0059   MCV 83 12/13/2017 1416   MCH 28.4 11/04/2021 0059   MCHC 30.9 11/04/2021 0059   RDW 17.3 (H) 11/04/2021 0059   RDW 15.8 (H) 12/13/2017 1416   LYMPHSABS 2.5 11/01/2021 0159   LYMPHSABS 4.1 (H) 12/13/2017 1416   MONOABS 2.3 (H) 11/01/2021 0159   EOSABS 0.3 11/01/2021 0159   EOSABS 0.3 12/13/2017 1416   BASOSABS 0.1 11/01/2021 0159   BASOSABS 0.1 12/13/2017 1416    BMET    Component Value Date/Time   NA 133 (L) 11/04/2021 0059   NA 146 (H) 05/10/2017 0950   K 4.0 11/04/2021 0059   CL 91 (L) 11/04/2021 0059   CO2 31 11/04/2021 0059   GLUCOSE 98 11/04/2021 0059   BUN 30 (H) 11/04/2021 0059   BUN 38 (H) 05/10/2017 0950   CREATININE 6.99 (H) 11/04/2021 0059   CREATININE 3.40 (H) 03/11/2017 1505   CALCIUM 9.9 11/04/2021 0059   GFRNONAA 6 (L) 11/04/2021 0059   GFRNONAA 15 (L) 03/11/2017 1505   GFRAA 5 (L) 05/05/2020 1314   GFRAA 17 (L) 03/11/2017 1505    INR    Component Value Date/Time   INR 1.2 10/21/2021 1425     Intake/Output Summary (Last 24 hours) at 11/06/2021 0828 Last data filed at 11/06/2021 0724 Gross per 24 hour  Intake  240 ml  Output --  Net 240 ml     Assessment/Plan:  56 y.o. female is s/p  Right iliofemoral endarterectomy with vein patch angioplasty, right great toe amputation and failed angioplasty right SFA and popliteal artery   7 Days Post-Op   Right groin is healing well.. Intact. Appears fuller this morning. No drainage. Will continue to monitor. No surgical intervention indicated at this time. No concerns for infection Right Great toe amp incision is intact. Duskiness of skin edges otherwise stable Will need to continue to monitor foot for healing Daily dressing changes Weight bearing only with Darco shoe Continue Asa + Statin  Has outpatient follow up arranged on 1/23  Karoline Caldwell, PA-C [facility] [phone] 11/06/2021 8:28 AM

## 2021-11-06 NOTE — Progress Notes (Addendum)
Following pt's case for out-pt HD needs at d/c. Pt is supposed to resume at her home clinic ([facility]) upon d/c. Pt has a MWF schedule and needs to arrive at 11:10 for 11:25 chair time. This information was provided to CSW. Will continue to follow and assist with HD needs.  Melven Sartorius Renal Navigator [phone]  Addendum at 12:28 pm: Case discussed with CSW. Pt to d/c to [facility] today. SNF will be transporting pt to [facility] in [place] on MWF. Contacted [facility] and spoke to clinic SW, [place] aware pt to d/c to [facility] snf today and will resume on Monday. Clinic has access to [facility] epic to obtain d/c summary. Contacted [employer] and spoke to [place]. Joy advised that pt is to d/c to snf today and will resume at home clinic on Monday.

## 2021-11-06 NOTE — Discharge Summary (Signed)
Physician Discharge Summary  Tammy Moses DVV:[id number] DOB: 1966/03/04 DOA: 10/21/2021  PCP: [name], FNP  Admit date: 10/21/2021 Discharge date: 11/06/2021  Time spent: 45 minutes   Recommendations for Outpatient Follow-up:  Patient will need to follow-up with [name] vascular surgery after discharge.  Office will call in 2 weeks to arrange appointment.  I will send an ambulatory referral to update on patient's discharge disposition She will discharge to [facility] as well as OT with focus on physical as well as cognitive issues Patient has been started on nightly Haldol to help with acute delirium.  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)   Anemia in chronic kidney disease (CKD)   Glaucoma (increased eye pressure)   Patellar sleeve fracture of right knee, closed, initial encounter   Gangrene of foot ([place])   Hyperlipidemia   Hepatitis C carrier (HCC)   Diabetic neuropathy (HCC)   Retinopathy, diabetic, bilateral (HCC)   Acute delirium   Peripheral arterial disease ([place])   Amputation of right great toe [facility])   Physical deconditioning    Discharge Condition: Stable  Diet recommendation: Carbohydrate modified diet with a 1200 cc fluid restriction  Filed Weights   11/04/21 0800 11/06/21 0833 11/06/21 1100  Weight: 53.1 kg 52.8 kg 51.8 kg    History of present illness:  56 year old female with history of ESRD on dialysis MWF, hypertension, normocytic anemia, COPD, blindness, tobacco use who presented with after falling down on her right knee when she slipped on ice.  She was unable to stand up and complained of pain and swelling of right knee and  was brought to the emergency department.  Imaging showed nondisplaced right patella fracture on x-ray.  Orthopedics was consulted and recommended right knee brace with PT and follow-up as an outpatient.  Patient was also found to have right toe gangrene, ABI showed severe peripheral artery disease.  Vascular surgery following, underwent  left lower extremity arteriography on 1/3 with finding of severe infrainguinal occlusive disease bilaterally. She underwent right great toe amputation, right iliofemoral endarterectomy with vein patch angioplasty on 1/6. Hospitalization complicated by delirium. PT/OT recommending SNF on [place] [facility] Course:  Closed, nondisplaced fracture of right patella: Due to fall.  - Orthopedics recommended knee immobilizer and outpatient follow up.  Ambulatory referral sent - Continue PT/OT     Right great toe gangrene, severe PAD: ABI showed severe peripheral artery disease.   CT angiogram showed severe atherosclerotic disease throughout the abdomen, pelvis and bilateral lower extremities,severe bilateral outflow disease . - s/p R iliofemoral endarterectomy with toe amp with unsuccessful revascularization of SFA/pop lesion with [name] on 10/30/2020.  - Note high risk of not healing amputation wound, would require more proximal amputation.  Per vascular team okay to follow wound in the outpatient setting - R heel weight bearing in darco shoe - Pt on high-intensity statin the aspirin -large hematoma in right groin-vascular team aware - states hematoma is soft and currently not causing any issues -Continue Lyrica and Dilaudid for postoperative pain in context of underlying severe peripheral neuropathy   ESRD:  - Continue HD per nephrology routinely   Diabetes mellitus 2 on short  acting insulin (POA) related to severe chronic kidney disease:  Recently well-controlled with HbA1c 5.9%.  - Continue SSI    Anemia of chronic disease, iron deficiency, and acute blood loss  anemia:  - s/p IV iron- [place] per nephrology  - Required 1u PRBCs on 1/7- suspect due to large hematoma of groin   Hypertension: - Continue norvasc, labetalol, losartan and hydralazine.     Acute delirium:  Worsened by recent surgery, acute pain and anesthesia.  - Delirium precautions.    Debility/deconditioning:  PT/OT recommending skilled nursing facility on discharge.  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    Procedures: Echocardiogram Abdominal aortogram with lower extremity runoff Right iliofemoral endarterectomy with vein patch angioplasty, right great toe amputation including metatarsal head, failed angioplasty of right SFA and popliteal artery, right lower extremity angiogram   Consultations: Cardiology Nephrology Vascular surgery Interventional radiology   Discharge Exam: Vitals:   11/06/21 1100 11/06/21 1144  BP: (!) 173/61 (!) 162/82  Pulse: 81 78  Resp: 20   Temp: 98 F (36.7 C)   SpO2: 100%       Discharge Instructions   Discharge Instructions     AMB referral to orthopedics   Complete by: As directed    Patient evaluated in the ER on 12/28 due to patellar fracture.  Seen by Mr. Dellis Filbert with recommendation to apply knee immobilizer and follow-up in the office after discharge.  She will be discharging to [facility] skilled nursing facility on 1/13   Ambulatory referral to Vascular Surgery   Complete by: As directed    ReviewPatient seen during the hospitalization and underwent vascularization and amputation of right great toe.  As of 1/13 she will discharge to [facility] skilled nursing facility in [place] Modified   Complete by: As directed    1200 cc fluid restriction   Increase activity slowly   Complete by: As directed    No wound care   Complete by: As directed       Allergies as of 11/06/2021       Reactions   Acyclovir And Related Itching        Medication List     STOP taking these medications    blood glucose  meter kit and supplies Kit   buPROPion 150 MG 12 hr tablet Commonly known as: ZYBAN Replaced by: buPROPion 150 MG 12 hr tablet   diltiazem 2 % Gel   furosemide 40 MG tablet Commonly known as: LASIX   gabapentin 600 MG tablet Commonly known as: Neurontin   glucose blood test strip Commonly known as: ONE TOUCH ULTRA TEST   Insulin Pen Needle 29G X 12MM Misc   onetouch ultrasoft lancets   pravastatin 80 MG tablet Commonly known as: PRAVACHOL       TAKE these medications    acetaminophen 325 MG tablet Commonly known as: TYLENOL Take 2 tablets (650 mg total) by mouth every 4 (four) hours as needed for headache or mild pain. What changed:  medication strength how much to take when to take this reasons to take this   albuterol 108 (90 Base) MCG/ACT inhaler Commonly known as: VENTOLIN HFA Inhale 2 puffs into the lungs every 4 (four) hours as needed for shortness of breath. For shortness of breath What changed:  reasons to take this additional instructions   amLODipine 10 MG tablet Commonly known as: NORVASC Take 1 tablet (10 mg total) by mouth daily. What changed: See the new instructions.  aspirin 81 MG EC tablet Take 1 tablet (81 mg total) by mouth daily. Swallow whole.   atorvastatin 80 MG tablet Commonly known as: LIPITOR Take 1 tablet (80 mg total) by mouth daily.   atropine 1 % ophthalmic solution Place 1 drop into both eyes 2 (two) times daily.   buPROPion 150 MG 12 hr tablet Commonly known as: WELLBUTRIN SR Take 1 tablet (150 mg total) by mouth daily. Replaces: buPROPion 150 MG 12 hr tablet   calcium acetate 667 MG capsule Commonly known as: PHOSLO Take 3 capsules (2,001 mg total) by mouth 3 (three) times daily with meals.   Darbepoetin Alfa 100 MCG/0.5ML Sosy injection Commonly known as: ARANESP Inject 0.5 mLs (100 mcg total) into the vein every Monday with hemodialysis. Start taking on: November 09, 2021   dorzolamide-timolol 22.3-6.8 MG/ML  ophthalmic solution Commonly known as: COSOPT Place 1 drop into both eyes 2 (two) times daily.   hydrALAZINE 100 MG tablet Commonly known as: APRESOLINE Take 1 tablet (100 mg total) by mouth every 8 (eight) hours. What changed: when to take this   HYDROmorphone 2 MG tablet Commonly known as: DILAUDID Take 0.5 tablets (1 mg total) by mouth every 4 (four) hours as needed for severe pain.   insulin aspart 100 UNIT/ML injection Commonly known as: novoLOG Inject 0-6 Units into the skin 3 (three) times daily with meals. Do NOT hold if patient is NPO. What changed:  how much to take when to take this additional instructions   labetalol 200 MG tablet Commonly known as: NORMODYNE Take 1 tablet (200 mg total) by mouth 2 (two) times daily.   lamoTRIgine 25 MG tablet Commonly known as: LAMICTAL Take 25 mg by mouth daily.   latanoprost 0.005 % ophthalmic solution Commonly known as: XALATAN Place 1 drop into both eyes at bedtime.   losartan 100 MG tablet Commonly known as: COZAAR Take 1 tablet (100 mg total) by mouth daily.   metoCLOPramide 10 MG tablet Commonly known as: REGLAN Take 10 mg by mouth in the morning and at bedtime.   multivitamin Tabs tablet Take 1 tablet by mouth at bedtime.   pregabalin 25 MG capsule Commonly known as: LYRICA Take 1 capsule (25 mg total) by mouth daily.   senna-docusate 8.6-50 MG tablet Commonly known as: Senokot-S Take 1 tablet by mouth at bedtime as needed for mild constipation.       Allergies  Allergen Reactions   Acyclovir And Related Itching    Follow-up Information     Serafina Mitchell, MD Follow up in 2 week(s).   Specialties: Vascular Surgery, Cardiology Why: Office will call you to arrange your appt (sent) Contact information: [address] [address] [phone]                  The results of significant diagnostics from this hospitalization (including imaging, microbiology, ancillary and  laboratory) are listed below for reference.    Significant Diagnostic Studies: CT ANGIO AO+BIFEM W & OR WO CONTRAST  Result Date: 10/24/2021 CLINICAL DATA:  Claudication. Right great toe gangrene. Abnormal right ABI, 0.35. EXAM: CT ANGIOGRAPHY OF ABDOMINAL AORTA WITH ILIOFEMORAL RUNOFF TECHNIQUE: Multidetector CT imaging of the abdomen, pelvis and lower extremities was performed using the standard protocol during bolus administration of intravenous contrast. Multiplanar CT image reconstructions and MIPs were obtained to evaluate the vascular anatomy. CONTRAST:  153m OMNIPAQUE IOHEXOL 350 MG/ML SOLN COMPARISON:  CT abdomen and pelvis 05/15/2021 FINDINGS: VASCULAR Aorta: Diffuse atherosclerotic disease in  the abdominal aorta without aneurysm, dissection or significant aortic stenosis. Celiac: Celiac trunk is patent with mixed plaque and mild stenosis. Again noted is a peripherally calcified structure in the region of the left adrenal gland and adjacent to the main splenic artery. 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 A connection with the splenic artery is not identified. Main branches of the celiac trunk are patent. SMA: SMA is patent with diffuse calcifications. No significant stenosis. No evidence for aneurysm or dissection. Renals: Origin of bilateral renal arteries are patent. There is diffuse plaque throughout the mid and distal aspect of the bilateral renal arteries causing at least mild stenosis in bilateral renal arteries. There is no evidence for a renal artery aneurysm. IMA: Patent. RIGHT Lower Extremity Inflow: Right iliac arteries are heavily calcified. Origin of the right internal iliac artery appears to be occluded. Diffuse mild narrowing throughout the right external iliac artery. Outflow: Right common femoral artery is diffusely calcified but patent without significant stenosis. Extensive atherosclerotic disease involving the  superficial femoral artery and profunda femoral arteries. Segmental areas of stenosis involving the proximal right SFA. Evidence for high-grade stenosis in the distal SFA. The proximal popliteal artery is occluded or there is severe stenosis. Popliteal artery is very difficult evaluate due to the calcified walls. Popliteal artery appears to be occluded at the level of the knee joint. Runoff: Runoff vessels are diffusely calcified which limits evaluation for patency. Evidence for segmental occlusions in the posterior tibial artery. Reconstitution of the posterior tibial artery near the ankle. Peroneal artery is likely patent. There is probably flow within the anterior tibial artery. LEFT Lower Extremity Inflow: Left iliac arteries are heavily calcified. Occlusion in the proximal left internal iliac artery. Diffuse narrowing in the left common and left external iliac arteries without aneurysm or dissection. Focal stenosis which could be high-grade in the distal left external iliac artery on sequence 7 image 119. Outflow: Evidence for high-grade stenosis in the proximal left common femoral artery on sequence 7, image 126. High-grade stenosis near the origin the left SFA. Left profunda femoral arteries are patent but heavily diseased. Left SFA is diffusely diseased with the evidence of segmental occlusive disease in the mid and distal aspects. Limited evaluation of the left popliteal artery due to extensive atherosclerotic calcifications. Suspect areas of high-grade stenosis with segmental occlusive disease in the left popliteal artery. Runoff: Limited evaluation due to calcified vessels. Peroneal artery appears to be patent. Anterior tibial artery is diffusely diseased but appears to have some flow. Evidence for segmental occlusive disease in the posterior tibial artery. Distal reconstitution of the posterior tibial artery near the ankle. Veins: No obvious venous abnormality within the limitations of this arterial  phase study. Review of the MIP images confirms the above findings. NON-VASCULAR Lower chest: Lung bases are clear. Hepatobiliary: No acute abnormality to the liver or gallbladder. Pancreas: Unremarkable. No pancreatic ductal dilatation or surrounding inflammatory changes. Spleen: Normal in size without focal abnormality. Adrenals/Urinary Tract: Again noted is a peripherally calcified structure in the region left adrenal gland and this could be related to a thrombosed aneurysm. Stable appearance of the adrenal glands. Negative for hydronephrosis. There is fluid in the urinary bladder. Stomach/Bowel: No evidence for acute bowel inflammation or bowel distension. Lymphatic: No significant lymph node enlargement in the abdomen or pelvis. Reproductive: Status post hysterectomy. No adnexal masses. Other: Mild stranding along the inferior aspect the right hepatic lobe and this appears chronic. No  evidence for ascites. Negative for free air. Small nodular structures in the anterior subcutaneous fat and suspect this is related to injection sites. Musculoskeletal: Small right knee joint effusion. No acute bone abnormality in the abdomen or pelvis. IMPRESSION: VASCULAR 1. Severe atherosclerotic disease throughout the abdomen, pelvis and bilateral lower extremities. 2. Severe bilateral outflow disease demonstrated by segmental areas of stenosis and occlusive disease in the superficial femoral arteries and popliteal arteries. High-grade stenosis in the proximal left common femoral artery. Difficult to accurately identify the areas of occlusive disease due to the heavily calcified vessels. 3. Bilateral runoff disease but limited evaluation due to the heavily calcified vessels. Suspect 2 vessel runoff bilaterally. 4. Bilateral iliac arteries are heavily calcified with areas of at least mild stenosis. Focal stenosis in the distal left external iliac artery. Occlusive disease involving the proximal internal iliac arteries  bilaterally. 5. Stable peripherally calcified structure in the left upper abdomen near the splenic artery. This could represent a thrombosed aneurysm but the etiology is uncertain. NON-VASCULAR 1. No acute abnormality in the abdomen or pelvis. 2. Mild stranding near the inferior right hepatic lobe and ascending colon. Findings are similar to the exam from 05/15/2021. Findings could be related to previous inflammation in this area. Electronically Signed   By: Markus Daft M.D.   On: 10/24/2021 15:58   PERIPHERAL VASCULAR CATHETERIZATION  Result Date: 10/27/2021 Images from the original result were not included. Patient name: CAMRIE STOCK MRN: [id number] DOB: 1966/06/01 Sex: female 10/21/2021 - 10/27/2021 Pre-operative Diagnosis: Right toe ulcer Post-operative diagnosis:  Same Surgeon:  Annamarie Major Procedure Performed:  1.  Ultrasound-guided access, left femoral artery  2.  Abdominal aortogram  3.  Bilateral lower extremity runoff  4.  Second-order catheterization  5.  Conscious sedation, 36 minutes Indications: This is a 56 year old female with end-stage renal disease who has dry gangrene to her right great toe.  She comes in today for further evaluation. Procedure:  The patient was identified in the holding area and taken to room 8.  The patient was then placed supine on the table and prepped and draped in the usual sterile fashion.  A time out was called.  Conscious sedation was administered with the use of IV fentanyl and Versed under continuous physician and nurse monitoring.  Heart rate, blood pressure, and oxygen saturation were continuously monitored.  Total sedation time was 36 minutes.  Ultrasound was used to evaluate the left common femoral artery.  It was patent .  A digital ultrasound image was acquired.  A micropuncture needle was used to access the left common femoral artery under ultrasound guidance.  An 018 wire was advanced without resistance and a micropuncture sheath was placed.  The 018 wire was  removed and a benson wire was placed.  The micropuncture sheath was exchanged for a 5 french sheath.  An omniflush catheter was advanced over the wire to the level of L-1.  An abdominal angiogram was obtained.  Next, using the omniflush catheter and a benson wire, the aortic bifurcation was crossed and the catheter was placed into theright external iliac artery and right runoff was obtained.  left runoff was performed via retrograde sheath injections. Findings:  Aortogram: No significant renal artery stenosis was identified.  The infrarenal abdominal aorta is heavily calcified but widely patent.  The right common and external iliac artery heavily calcified but without significant stenosis.  Small caliber left common and external iliac arteries with moderate left common iliac stenosis.  Right Lower Extremity:  Greater than 60% right common femoral artery calcific disease.  The profundofemoral artery is small in caliber.  It has significant disease distally.  The superficial femoral artery is also very small in caliber with multiple areas of high-grade lesion/occlusions.  Popliteal artery is a very small vessel.  There is single-vessel runoff via the peroneal artery which also has multiple areas of stenosis.  Left Lower Extremity: The left common femoral artery is nearly occluded.  A large profunda branch is visualized going down the leg.  The sheath appears to be occlusive and sewed the remaining portion of the leg was not visualized. Intervention:  none Impression:  #1  Severe infrainguinal occlusive disease bilaterally  #2  On the right, there is greater than 60-70% common femoral stenosis.  In addition the superficial femoral artery is extremely small in caliber with multiple high-grade areas of stenosis.  Popliteal artery is very small in caliber and there is single-vessel runoff via a diseased peroneal artery  #3  The patient does not have good options for revascularization.  I think her best approach is going to  be a right femoral endarterectomy in the operating room and simultaneous percutaneous intervention onto the peroneal artery as well as the superficial femoral-popliteal artery.  This has been scheduled for Friday. Theotis Burrow, M.D., [facility] Office: [phone] Pager:  [phone]   DG Knee Complete 4 Views Right  Result Date: 10/21/2021 CLINICAL DATA:  Knee pain after a fall yesterday. EXAM: RIGHT KNEE - COMPLETE 4+ VIEW COMPARISON:  None. FINDINGS: There is a nondisplaced fracture of the lower pole of the patella. There is mild overlying soft tissue swelling. There is no dislocation, and no sizable knee joint effusion is evident. Femorotibial joint space widths are preserved. Extensive atherosclerotic vascular calcifications are noted. IMPRESSION: Nondisplaced patellar fracture. Electronically Signed   By: Logan Bores M.D.   On: 10/21/2021 08:32   DG Foot Complete Right  Result Date: 10/21/2021 CLINICAL DATA:  Fall yesterday.  Toe pain and discoloration. EXAM: RIGHT FOOT COMPLETE - 3+ VIEW COMPARISON:  09/22/2021 FINDINGS: No acute fracture or dislocation is identified. No osseous erosion is evident. Extensive atherosclerotic vascular calcifications are again noted. IMPRESSION: No acute osseous abnormality identified. Electronically Signed   By: Logan Bores M.D.   On: 10/21/2021 08:35   VAS [place] ABI WITH/WO TBI  Result Date: 10/22/2021  LOWER EXTREMITY DOPPLER STUDY Patient Name:  TYFFANI FOGLESONG  Date of Exam:   10/22/2021 Medical Rec #: [id number]       Accession #:    [id number] Date of Birth: Jun 18, 1966       Patient Gender: F Patient Age:   12 years Exam Location:  [facility] Procedure:      VAS [place] ABI WITH/WO TBI Referring Phys: MICHAEL JEFFERY --------------------------------------------------------------------------------  Indications: Ulceration. High Risk Factors: Hypertension, Diabetes.  Limitations: Today's exam was limited due to Left  HD access. Comparison Study: No prior studies. Performing Technologist: Carlos Levering RVT  Examination Guidelines: A complete evaluation includes at minimum, Doppler waveform signals and systolic blood pressure reading at the level of bilateral brachial, anterior tibial, and posterior tibial arteries, when vessel segments are accessible. Bilateral testing is considered an integral part of a complete examination. Photoelectric Plethysmograph (PPG) waveforms and toe systolic pressure readings are included as required and additional duplex testing as needed. Limited examinations for reoccurring indications may be performed as noted.  ABI Findings: +---------+------------------+-----+----------+--------------------------------+  Right     Rt  Pressure (mmHg) Index Waveform   Comment                           +---------+------------------+-----+----------+--------------------------------+  Brachial  188                      triphasic  Pressure was taken from BP                                                       monitor. Waveforms were                                                          noncompressible with ABI                                                         equipment.                        +---------+------------------+-----+----------+--------------------------------+  PTA       43                 0.23  monophasic                                   +---------+------------------+-----+----------+--------------------------------+  DP        65                 0.35  monophasic                                   +---------+------------------+-----+----------+--------------------------------+  Great Toe                          Absent                                       +---------+------------------+-----+----------+--------------------------------+ +---------+------------------+-----+----------+---------+  Left      Lt Pressure (mmHg) Index Waveform   Comment     +---------+------------------+-----+----------+---------+  Brachial                                      HD Access  +---------+------------------+-----+----------+---------+  PTA       254                1.35  monophasic            +---------+------------------+-----+----------+---------+  DP        254                1.35  monophasic            +---------+------------------+-----+----------+---------+  Great Toe                          Absent                +---------+------------------+-----+----------+---------+ +-------+-----------+-----------+------------+------------+  ABI/TBI Today's ABI Today's TBI Previous ABI Previous TBI  +-------+-----------+-----------+------------+------------+  Right   0.35                                               +-------+-----------+-----------+------------+------------+  Left    [place]                                                 +-------+-----------+-----------+------------+------------+ The right brachial pressure was obtained from the patient's BP cuff. Right brachial pressure was noncompressible with ABI cuff. Unable to obtain left pressure due to HD access.  Summary: Right: Resting right ankle-brachial index indicates severe right lower extremity arterial disease. The right toe-brachial index is abnormal. Left: Resting left ankle-brachial index indicates noncompressible left lower extremity arteries. The left toe-brachial index is abnormal.  *See table(s) above for measurements and observations.  Electronically signed by Harold Barban MD on 10/22/2021 at 7:59:49 PM.    Final    VAS [place] LOWER EXTREMITY SAPHENOUS VEIN MAPPING  Result Date: 10/29/2021 LOWER EXTREMITY VEIN MAPPING Patient Name:  ALEXIAH KOROMA  Date of Exam:   10/29/2021 Medical Rec #: [id number]       Accession #:    [id number] Date of Birth: [birth year]       Patient Gender: F Patient Age:   1 years Exam Location:  [facility] Procedure:      VAS [place] LOWER EXTREMITY SAPHENOUS VEIN MAPPING Referring  Phys: Harold Barban --------------------------------------------------------------------------------  Indications: Blue toe syndrome  Comparison Study: no prior Performing Technologist: Archie Patten RVS  Examination Guidelines: A complete evaluation includes B-mode imaging, spectral Doppler, color Doppler, and power Doppler as needed of all accessible portions of each vessel. Bilateral testing is considered an integral part of a complete examination. Limited examinations for reoccurring indications may be performed as noted. +---------------+-----------+----------------------+---------------+-----------+    RT Diameter   RT Findings          GSV             LT Diameter   LT Findings        (cm)                                               (cm)                    +---------------+-----------+----------------------+---------------+-----------+       0.43                       Saphenofemoral          0.46  Junction                                     +---------------+-----------+----------------------+---------------+-----------+       0.36                       Proximal thigh          0.33                    +---------------+-----------+----------------------+---------------+-----------+       0.36                         Mid thigh             0.32                    +---------------+-----------+----------------------+---------------+-----------+       0.36                        Distal thigh           0.36                    +---------------+-----------+----------------------+---------------+-----------+       0.41                            Knee               0.30                    +---------------+-----------+----------------------+---------------+-----------+       0.25                         Prox calf             0.30                    +---------------+-----------+----------------------+---------------+-----------+       0.26                           Mid calf             0.29                    +---------------+-----------+----------------------+---------------+-----------+       0.24                        Distal calf            0.22                    +---------------+-----------+----------------------+---------------+-----------+       0.32                           Ankle               0.22                    +---------------+-----------+----------------------+---------------+-----------+ Diagnosing physician: Jamelle Haring Electronically signed by Jamelle Haring on 10/29/2021 at 4:02:05 PM.    Final    ECHOCARDIOGRAM COMPLETE  Result Date: 10/28/2021    ECHOCARDIOGRAM REPORT   Patient  Name:   Melvia Heaps Date of Exam: 10/28/2021 Medical Rec #:  [id number]      Height:       61.0 in Accession #:    [id number]     Weight:       117.7 lb Date of Birth:  [birth year]      BSA:          1.508 m Patient Age:    5 years       BP:           139/88 mmHg Patient Gender: F              HR:           72 bpm. Exam Location:  Inpatient Procedure: 2D Echo, Color Doppler and Cardiac Doppler Indications:    Murmur  History:        Patient has prior history of Echocardiogram examinations. COPD;                 Risk Factors:Diabetes and Hypertension.  Sonographer:    Jyl Heinz Referring Phys: [place]  1. Left ventricular ejection fraction, by estimation, is 60 to 65%. The left ventricle has normal function. The left ventricle has no regional wall motion abnormalities. The left ventricular internal cavity size was mildly dilated. There is mild left ventricular hypertrophy. Left ventricular diastolic parameters are consistent with Grade II diastolic dysfunction (pseudonormalization). Elevated left atrial pressure.  2. Right ventricular systolic function is normal. The right ventricular size is normal. There is normal pulmonary artery systolic pressure. The estimated right ventricular systolic pressure is 86.7 mmHg.  3. Right atrial size was mildly  dilated.  4. Left atrial size was mildly dilated.  5. The mitral valve is degenerative. Mild mitral valve regurgitation. Moderate mitral stenosis. The mean mitral valve gradient is 6.0 mmHg at 73bpm. MVA 1.4cm^2 by continuity equation. Moderate mitral annular calcification.  6. Tricuspid valve regurgitation is mild to moderate.  7. The aortic valve is tricuspid. There is moderate calcification of the aortic valve. Aortic valve regurgitation is not visualized. Mild aortic valve stenosis.  8. The inferior vena cava is normal in size with greater than 50% respiratory variability, suggesting right atrial pressure of 3 mmHg. FINDINGS  Left Ventricle: Left ventricular ejection fraction, by estimation, is 60 to 65%. The left ventricle has normal function. The left ventricle has no regional wall motion abnormalities. The left ventricular internal cavity size was mildly dilated. There is  mild left ventricular hypertrophy. Left ventricular diastolic parameters are consistent with Grade II diastolic dysfunction (pseudonormalization). Elevated left atrial pressure. Right Ventricle: The right ventricular size is normal. No increase in right ventricular wall thickness. Right ventricular systolic function is normal. There is normal pulmonary artery systolic pressure. The tricuspid regurgitant velocity is 2.65 m/s, and  with an assumed right atrial pressure of 3 mmHg, the estimated right ventricular systolic pressure is 67.2 mmHg. Left Atrium: Left atrial size was mildly dilated. Right Atrium: Right atrial size was mildly dilated. Pericardium: There is no evidence of pericardial effusion. Mitral Valve: The mitral valve is degenerative in appearance. Moderate mitral annular calcification. Mild mitral valve regurgitation. Moderate mitral valve stenosis. MV peak gradient, 13.0 mmHg. The mean mitral valve gradient is 6.0 mmHg. Tricuspid Valve: The tricuspid valve is normal in structure. Tricuspid valve regurgitation is mild to  moderate. Aortic Valve: The aortic valve is tricuspid. There is moderate calcification of the aortic valve. Aortic valve regurgitation is not visualized.  Mild aortic stenosis is present. Aortic valve peak gradient measures 15.1 mmHg. Pulmonic Valve: The pulmonic valve was normal in structure. Pulmonic valve regurgitation is trivial. Aorta: The aortic root and ascending aorta are structurally normal, with no evidence of dilitation. Venous: The inferior vena cava is normal in size with greater than 50% respiratory variability, suggesting right atrial pressure of 3 mmHg. IAS/Shunts: No atrial level shunt detected by color flow Doppler.  LEFT VENTRICLE PLAX 2D LVIDd:         5.70 cm      Diastology LVIDs:         3.60 cm      LV e' medial:    5.66 cm/s LV PW:         1.20 cm      LV E/e' medial:  29.7 LV IVS:        1.20 cm      LV e' lateral:   5.77 cm/s LVOT diam:     1.80 cm      LV E/e' lateral: 29.1 LV SV:         68 LV SV Index:   45 LVOT Area:     2.54 cm  LV Volumes (MOD) LV vol d, MOD A2C: 131.0 ml LV vol d, MOD A4C: 118.0 ml LV vol s, MOD A2C: 54.4 ml LV vol s, MOD A4C: 48.5 ml LV SV MOD A2C:     76.6 ml LV SV MOD A4C:     118.0 ml LV SV MOD BP:      72.2 ml RIGHT VENTRICLE             IVC RV Basal diam:  3.80 cm     IVC diam: 1.40 cm RV Mid diam:    3.30 cm RV S prime:     10.90 cm/s TAPSE (M-mode): 3.0 cm LEFT ATRIUM             Index        RIGHT ATRIUM           Index LA diam:        3.50 cm 2.32 cm/m   RA Area:     18.60 cm LA Vol (A2C):   47.4 ml 31.44 ml/m  RA Volume:   52.10 ml  34.55 ml/m LA Vol (A4C):   58.9 ml 39.06 ml/m LA Biplane Vol: 54.5 ml 36.14 ml/m  AORTIC VALVE AV Area (Vmax): 1.56 cm AV Vmax:        194.00 cm/s AV Peak Grad:   15.1 mmHg LVOT Vmax:      119.00 cm/s LVOT Vmean:     89.900 cm/s LVOT VTI:       0.268 m  AORTA Ao Root diam: 2.50 cm Ao Asc diam:  2.80 cm MITRAL VALVE                TRICUSPID VALVE MV Area (PHT): 3.68 cm     TR Peak grad:   28.1 mmHg MV Area VTI:   1.47  cm     TR Vmax:        265.00 cm/s MV Peak grad:  13.0 mmHg MV Mean grad:  6.0 mmHg     SHUNTS MV Vmax:       1.80 m/s     Systemic VTI:  0.27 m MV Vmean:      118.0 cm/s   Systemic Diam: 1.80 cm MV Decel Time: 206 msec MV E velocity: 168.00 cm/s MV A velocity:  132.00 cm/s MV E/A ratio:  1.27 Oswaldo Milian MD Electronically signed by Oswaldo Milian MD Signature Date/Time: 10/28/2021/5:07:15 PM    Final     Microbiology: Recent Results (from the past 240 hour(s))  Surgical pcr screen     Status: None   Collection Time: 10/30/21  3:10 AM   Specimen: Nasal Mucosa; Nasal Swab  Result Value Ref Range Status   MRSA, PCR NEGATIVE NEGATIVE Final   Staphylococcus aureus NEGATIVE NEGATIVE Final    Comment: (NOTE) The Xpert SA Assay (FDA approved for NASAL specimens in patients 104 years of age and older), is one component of a comprehensive surveillance program. It is not intended to diagnose infection nor to guide or monitor treatment. Performed at [facility], [place] [address] [place], [place] [address]      Labs: Basic Metabolic Panel: Recent Labs  Lab 10/31/21 0830 11/02/21 0222 11/04/21 0059 11/06/21 0914  NA 136 132* 133* 136  K 4.8 4.4 4.0 4.0  CL 99 92* 91* 96*  CO2 _0 GLUCOSE 83 89 98 144*  BUN 41* 34* 30* 31*  CREATININE 10.52* 7.99* 6.99* 8.29*  CALCIUM 9.7 10.3 9.9 10.0  PHOS 6.2*  --  4.2 5.5*   Liver Function Tests: Recent Labs  Lab 10/31/21 0830 11/04/21 0059 11/06/21 0914  ALBUMIN 3.0* 2.8* 2.9*   No results for input(s): LIPASE, AMYLASE in the last 168 hours. No results for input(s): AMMONIA in the last 168 hours. CBC: Recent Labs  Lab 10/31/21 0830 10/31/21 1432 11/01/21 0159 11/02/21 0222 11/04/21 0059 11/06/21 0915  WBC 15.7*  --  14.9* 13.6* 13.7* 15.9*  NEUTROABS  --   --  9.7*  --   --   --   HGB 6.7* 8.6* 8.7* 8.4* 9.2* 8.9*  HCT 21.7* 25.4* 27.5* 27.3* 29.8* 29.5*  MCV 95.2  --  90.8 92.2 92.0 93.1  PLT 346  --   314 316 315 362   Cardiac Enzymes: No results for input(s): CKTOTAL, CKMB, CKMBINDEX, TROPONINI in the last 168 hours. BNP: BNP (last 3 results) Recent Labs    09/16/21 1405 09/30/21 1055 09/30/21 2118  BNP 3,461.4* 3,585.7* 4,037.8*    ProBNP (last 3 results) No results for input(s): PROBNP in the last 8760 hours.  CBG: Recent Labs  Lab 11/05/21 1230 11/05/21 1701 11/05/21 2137 11/06/21 0659 11/06/21 1131  GLUCAP 141* 120* 101* 85 155*       Signed:  Erin Hearing ANP [facility] 11/06/2021, 12:16 PM

## 2021-11-06 NOTE — Progress Notes (Signed)
[facility] Progress Note     Assessment/ Plan:   1) Patellar fracture: In immobilizer.  Management per primary team   2) ESRD: previously at [facility] MWF schedule for now, s/p HD 11/06/21 - d/c today  3) Hypertension/volume: Blood pressure fairly high but overall improved.  Volume removal with dialysis. I increased her losartan to 100 mg daily      4) Anemia secondary to ESRD: Hgb 7.3, may need transfusion soon. Iron sat 7 and ferritin 742.  s/p IV Iron Aranesp 158mcg    5) secondary hyperparathyroidism: Continue PhosLo.  PTH 429. Phos 1/5 5.3.   6) Diabetes Mellitus Type 2: Continue management per primary team   7) Gangrenous right toe: Related to peripheral arterial disease.  Vascular surgery following -> s/p  1/6 rt ilio/fem endarterectomy and  rt GT amputation w/ good capillary bleeding but unable to intervene on rt SFA/pop lesion.  8.  Delirium- consider decreasing gabapentin to 100 QHS only  Subjective:    Approved for d/c to SNF today.  S/p HD, cut her treatment short today.  No needs expressed today   Objective:   BP (!) 162/82    Pulse 78    Temp 98 F (36.7 C) (Oral)    Resp 20    Ht 5\' 1"  (1.549 m)    Wt 51.8 kg    SpO2 100%    BMI 21.58 kg/m   Intake/Output Summary (Last 24 hours) at 11/06/2021 1346 Last data filed at 11/06/2021 1110 Gross per 24 hour  Intake 240 ml  Output 1000 ml  Net -760 ml   Weight change:   Physical Exam: Constitutional: chronically ill appearing, NAD HEENT: R eye clouded CV: normal rate, no edema Respiratory: clear bilaterally Gastrointestinal: SNDNT + BS Skin: s/p amputation of right toe, dressed, R groin hematoma Access: L AVF, + T/B    Imaging: No results found.  Labs: BMET Recent Labs  Lab 10/31/21 0830 11/02/21 0222 11/04/21 0059 11/06/21 0914  NA 136 132* 133* 136  K 4.8 4.4 4.0 4.0  CL 99 92* 91* 96*  CO2 23 27 31 25   GLUCOSE 83 89 98 144*  BUN 41* 34* 30* 31*   CREATININE 10.52* 7.99* 6.99* 8.29*  CALCIUM 9.7 10.3 9.9 10.0  PHOS 6.2*  --  4.2 5.5*   CBC Recent Labs  Lab 11/01/21 0159 11/02/21 0222 11/04/21 0059 11/06/21 0915  WBC 14.9* 13.6* 13.7* 15.9*  NEUTROABS 9.7*  --   --   --   HGB 8.7* 8.4* 9.2* 8.9*  HCT 27.5* 27.3* 29.8* 29.5*  MCV 90.8 92.2 92.0 93.1  PLT 314 316 315 362    Medications:     sodium chloride   Intravenous Once   sodium chloride   Intravenous Once   amLODipine  10 mg Oral Daily   aspirin EC  81 mg Oral Daily   atorvastatin  80 mg Oral Daily   atropine  1 drop Both Eyes BID   buPROPion  150 mg Oral Daily   calcium acetate  2,001 mg Oral TID WC   Chlorhexidine Gluconate Cloth  6 each Topical Q0600   darbepoetin (ARANESP) injection - DIALYSIS  100 mcg Intravenous Q Mon-HD   dorzolamide-timolol  1 drop Both Eyes BID   heparin  5,000 Units Subcutaneous Q12H   hydrALAZINE  100 mg Oral Q8H   HYDROmorphone       insulin aspart  0-6 Units Subcutaneous TID WC  labetalol  200 mg Oral BID   lamoTRIgine  25 mg Oral Daily   latanoprost  1 drop Both Eyes QHS   losartan  100 mg Oral Daily   multivitamin  1 tablet Oral QHS   pregabalin  25 mg Oral Daily   sodium chloride flush  3 mL Intravenous Q12H      Madelon Lips, MD 11/06/2021, 1:46 PM

## 2021-11-06 NOTE — Progress Notes (Signed)
PT Cancellation Note  Patient Details Name: Tammy Moses MRN: [id number] DOB: 1966-10-21   Cancelled Treatment:     Attempted to see pt for PT treatment, however pt off unit for HD. Will attempt to make up time as schedule allows.   Betsey Holiday Lyn Hollingshead PT, DPT  11/06/2021, 9:51 AM

## 2021-11-06 NOTE — Progress Notes (Incomplete)
[facility] Progress Note     Assessment/ Plan:   1) Patellar fracture: In immobilizer.  Management per primary team   2) ESRD: previously at [facility] MWF schedule for now, next HD 11/06/21  3) Hypertension/volume: Blood pressure fairly high but overall improved.  Volume removal with dialysis. I increased her losartan to 100 mg daily      4) Anemia secondary to ESRD: Hgb 7.3, may need transfusion soon. Iron sat 7 and ferritin 742.  s/p IV Iron Aranesp 122mcg    5) secondary hyperparathyroidism: Continue PhosLo.  PTH 429. Phos 1/5 5.3.   6) Diabetes Mellitus Type 2: Continue management per primary team   7) Gangrenous right toe: Related to peripheral arterial disease.  Vascular surgery following -> s/p  1/6 rt ilio/fem endarterectomy and  rt GT amputation w/ good capillary bleeding but unable to intervene on rt SFA/pop lesion.  8.  Delirium- consider decreasing gabapentin to 100 QHS only  Subjective:    HD yesterday, 500 mL UOP.  Delirious this AM- says that she "doesn't want to go to the wilderness", referring to camping.  Still pretty hypertensive   Objective:   BP (!) 162/82    Pulse 78    Temp 98 F (36.7 C) (Oral)    Resp 20    Ht 5\' 1"  (1.549 m)    Wt 51.8 kg    SpO2 100%    BMI 21.58 kg/m   Intake/Output Summary (Last 24 hours) at 11/06/2021 1345 Last data filed at 11/06/2021 1110 Gross per 24 hour  Intake 240 ml  Output 1000 ml  Net -760 ml   Weight change:   Physical Exam: Constitutional: chronically ill appearing, NAD HEENT: R eye clouded CV: normal rate, no edema Respiratory: clear bilaterally Gastrointestinal: SNDNT + BS Skin: s/p amputation of right toe, dressed, R groin hematoma Access: L AVF, + T/B    Imaging: No results found.  Labs: BMET Recent Labs  Lab 10/31/21 0830 11/02/21 0222 11/04/21 0059 11/06/21 0914  NA 136 132* 133* 136  K 4.8 4.4 4.0 4.0  CL 99 92* 91* 96*  CO2 23 27 31 25   GLUCOSE  83 89 98 144*  BUN 41* 34* 30* 31*  CREATININE 10.52* 7.99* 6.99* 8.29*  CALCIUM 9.7 10.3 9.9 10.0  PHOS 6.2*  --  4.2 5.5*   CBC Recent Labs  Lab 11/01/21 0159 11/02/21 0222 11/04/21 0059 11/06/21 0915  WBC 14.9* 13.6* 13.7* 15.9*  NEUTROABS 9.7*  --   --   --   HGB 8.7* 8.4* 9.2* 8.9*  HCT 27.5* 27.3* 29.8* 29.5*  MCV 90.8 92.2 92.0 93.1  PLT 314 316 315 362    Medications:     sodium chloride   Intravenous Once   sodium chloride   Intravenous Once   amLODipine  10 mg Oral Daily   aspirin EC  81 mg Oral Daily   atorvastatin  80 mg Oral Daily   atropine  1 drop Both Eyes BID   buPROPion  150 mg Oral Daily   calcium acetate  2,001 mg Oral TID WC   Chlorhexidine Gluconate Cloth  6 each Topical Q0600   darbepoetin (ARANESP) injection - DIALYSIS  100 mcg Intravenous Q Mon-HD   dorzolamide-timolol  1 drop Both Eyes BID   heparin  5,000 Units Subcutaneous Q12H   hydrALAZINE  100 mg Oral Q8H   HYDROmorphone       insulin aspart  0-6 Units  Subcutaneous TID WC   labetalol  200 mg Oral BID   lamoTRIgine  25 mg Oral Daily   latanoprost  1 drop Both Eyes QHS   losartan  100 mg Oral Daily   multivitamin  1 tablet Oral QHS   pregabalin  25 mg Oral Daily   sodium chloride flush  3 mL Intravenous Q12H      Madelon Lips, MD 11/06/2021, 1:45 PM

## 2021-11-06 NOTE — NC FL2 (Signed)
[place] LEVEL OF CARE SCREENING TOOL     IDENTIFICATION  Patient Name: Tammy Moses Birthdate: October 29, 1965 Sex: female Admission Date (Current Location): 10/21/2021  [facility] and [place] Number:  Herbalist and Address:  [facility] [place] [address] [place], [place] [address]      Provider Number: [id number]  Attending Physician Name and Address:  Geradine Girt, DO  Relative Name and Phone Number:  Jeralene Huff    Current Level of Care: Hospital Recommended Level of Care: [place] Prior Approval Number:    Date Approved/Denied:   PASRR Number: [id number]  Discharge Plan: SNF    Current Diagnoses: Patient Active Problem List   Diagnosis Date Noted   Gangrene of foot ([place]) 10/23/2021   Patellar sleeve fracture of right knee, closed, initial encounter 10/21/2021   Closed patellar sleeve fracture of right knee 10/21/2021   Malignant hypertension 09/30/2021   Flash pulmonary edema ([place]) 09/30/2021   Non compliance with medical treatment 09/30/2021   Foot pain, right 09/22/2021   Elevated troponin 09/16/2021   Anemia due to chronic kidney disease 09/16/2021   Impaired functional mobility and activity tolerance 01/04/2019   Pneumonia of both lungs due to infectious organism 10/05/2017   Fall    Anemia 07/12/2017   COPD (chronic obstructive pulmonary disease) ([place]) 06/24/2017   ESRD (end stage renal disease) ([place]) -  Dialyzes at [facility].  On Monday Wednesday Friday. 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[place]) 05/10/2017   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   Vitamin D deficiency 04/07/2017   Absolute glaucoma of right eye 12/27/2016   Chronic angle-closure glaucoma of  left eye, severe stage 12/27/2016   PCO (posterior capsule opacification), left 12/27/2016   Gait abnormality 03/07/2014   Abnormality of gait 03/07/2014   Hepatitis C virus infection without hepatic coma 03/04/2014   History of hepatitis C 03/04/2014   Erosive gastritis 02/14/2014   Pseudophakia of left eye 10/30/2012   Asthma 10/13/2012   Reflux 10/13/2012   Lens replaced by other means 10/12/2012   Primary open angle glaucoma 10/12/2012   BACK PAIN, LUMBAR 08/18/2010   IDDM 08/11/2010   HYPERCHOLESTEROLEMIA 08/11/2010   HYPOKALEMIA 08/11/2010   DEPRESSION 08/11/2010   GLAUCOMA 08/11/2010   BLINDNESS, RIGHT EYE 08/11/2010   Essential hypertension 08/11/2010   GERD 08/11/2010   CONSTIPATION 08/06/2009    Orientation RESPIRATION BLADDER Height & Weight     Self, Time  Normal Incontinent Weight: 117 lb 1 oz (53.1 kg) Height:  5\' 1"  (154.9 cm)  BEHAVIORAL SYMPTOMS/MOOD NEUROLOGICAL BOWEL NUTRITION STATUS      Continent Diet (Normal)  AMBULATORY STATUS COMMUNICATION OF NEEDS Skin   Extensive Assist Verbally Normal                       Personal Care Assistance Level of Assistance  Feeding, Dressing, Bathing Bathing Assistance: Maximum assistance Feeding assistance: Independent Dressing Assistance: Limited assistance     Functional Limitations Info  Sight, Hearing, Speech Sight Info: Impaired Hearing Info: Adequate Speech Info: Adequate    SPECIAL CARE FACTORS FREQUENCY  PT (By licensed PT), OT (By licensed OT)     PT Frequency: 5x weekly OT Frequency: 5x weekly  Contractures Contractures Info: Not present    Additional Factors Info  Code Status, Allergies, Insulin Sliding Scale Code Status Info: Full Code Allergies Info: Acyclovir and Related   Insulin Sliding Scale Info: See discharge summary       Current Medications (11/06/2021):  This is the current hospital active medication list Current Facility-Administered Medications   Medication Dose Route Frequency Provider Last Rate Last Admin   0.9 %  sodium chloride infusion (Manually program via Guardrails IV Fluids)   Intravenous Once Swayze, Ava, DO       0.9 %  sodium chloride infusion (Manually program via Guardrails IV Fluids)   Intravenous Once Dwana Melena, MD       0.9 %  sodium chloride infusion  250 mL Intravenous PRN Baglia, Corrina, PA-C       acetaminophen (TYLENOL) tablet 1,000 mg  1,000 mg Oral Q6H PRN Baglia, Corrina, PA-C   1,000 mg at 11/05/21 2133   acetaminophen (TYLENOL) tablet 650 mg  650 mg Oral Q4H PRN Baglia, Corrina, PA-C   650 mg at 11/04/21 0755   albuterol (PROVENTIL) (2.5 MG/3ML) 0.083% nebulizer solution 3 mL  3 mL Inhalation Q4H PRN Baglia, Corrina, PA-C       amLODipine (NORVASC) tablet 10 mg  10 mg Oral Daily Baglia, Corrina, PA-C   10 mg at 11/05/21 1000   aspirin EC tablet 81 mg  81 mg Oral Daily Eulogio Bear U, DO   81 mg at 11/05/21 1000   atorvastatin (LIPITOR) tablet 80 mg  80 mg Oral Daily Broadus John, MD   80 mg at 11/05/21 1001   atropine 1 % ophthalmic solution 1 drop  1 drop Both Eyes BID Baglia, Corrina, PA-C   1 drop at 11/05/21 2141   benzonatate (TESSALON) capsule 100 mg  100 mg Oral TID PRN Arvilla Market, Corrina, PA-C   100 mg at 10/24/21 2132   buPROPion (WELLBUTRIN SR) 12 hr tablet 150 mg  150 mg Oral Daily Baglia, Corrina, PA-C   150 mg at 11/05/21 1000   calcium acetate (PHOSLO) capsule 2,001 mg  2,001 mg Oral TID WC Baglia, Corrina, PA-C   2,001 mg at 11/03/21 1720   Chlorhexidine Gluconate Cloth 2 % PADS 6 each  6 each Topical Q0600 Baglia, Corrina, PA-C   6 each at 11/06/21 0700   Darbepoetin Alfa (ARANESP) injection 100 mcg  100 mcg Intravenous Q Mon-HD Baglia, Corrina, PA-C   100 mcg at 11/02/21 1612   dorzolamide-timolol (COSOPT) 22.3-6.8 MG/ML ophthalmic solution 1 drop  1 drop Both Eyes BID Baglia, Corrina, PA-C   1 drop at 11/05/21 2136   gabapentin (NEURONTIN) capsule 100 mg  100 mg Oral TID Samella Parr,  NP   100 mg at 11/05/21 2133   heparin injection 5,000 Units  5,000 Units Subcutaneous Q12H Baglia, Corrina, PA-C   5,000 Units at 11/05/21 2134   hydrALAZINE (APRESOLINE) injection 5 mg  5 mg Intravenous Q20 Min PRN Baglia, Corrina, PA-C       hydrALAZINE (APRESOLINE) tablet 100 mg  100 mg Oral Q8H Baglia, Corrina, PA-C   100 mg at 11/05/21 2137   HYDROmorphone (DILAUDID) tablet 1 mg  1 mg Oral Q6H PRN Samella Parr, NP   1 mg at 11/05/21 1722   insulin aspart (novoLOG) injection 0-6 Units  0-6 Units Subcutaneous TID WC Baglia, Corrina, PA-C   1 Units at 10/30/21 1614   labetalol (NORMODYNE) injection 10 mg  10 mg Intravenous Q10 min PRN  Baglia, Corrina, PA-C   10 mg at 10/31/21 1855   labetalol (NORMODYNE) tablet 200 mg  200 mg Oral BID Baglia, Corrina, PA-C   200 mg at 11/05/21 2136   lamoTRIgine (LAMICTAL) tablet 25 mg  25 mg Oral Daily Baglia, Corrina, PA-C   25 mg at 11/05/21 1000   latanoprost (XALATAN) 0.005 % ophthalmic solution 1 drop  1 drop Both Eyes QHS Baglia, Corrina, PA-C   1 drop at 11/05/21 2135   losartan (COZAAR) tablet 100 mg  100 mg Oral Daily Madelon Lips, MD       multivitamin (RENA-VIT) tablet 1 tablet  1 tablet Oral QHS Baglia, Corrina, PA-C   1 tablet at 11/05/21 2133   ondansetron (ZOFRAN) injection 4 mg  4 mg Intravenous Q6H PRN Baglia, Corrina, PA-C       senna-docusate (Senokot-S) tablet 1 tablet  1 tablet Oral QHS PRN Baglia, Corrina, PA-C   1 tablet at 11/01/21 2112   sodium chloride flush (NS) 0.9 % injection 3 mL  3 mL Intravenous Q12H Baglia, Corrina, PA-C   3 mL at 11/05/21 2141   sodium chloride flush (NS) 0.9 % injection 3 mL  3 mL Intravenous PRN Karoline Caldwell, PA-C         Discharge Medications: Please see discharge summary for a list of discharge medications.  Relevant Imaging Results:  Relevant Lab Results:   Additional Information SSN: [id number] HD MWF in [place] Pt reports vaccinated with one booster  Archie Endo, LCSW

## 2021-11-06 NOTE — Progress Notes (Addendum)
12:30pm: CSW spoke with patient at bedside to inform her of discharge plan. After discussion and questions were answered, patient is agreeable to go to [facility] for rehabilitation.   CSW spoke with patient's niece Jesse Fall to inform her of discharge plan. Jeralene Huff states she will meet the patient at the facility upon her arrival.  11:45am: CSW spoke with staff at [facility] transportation to have patient's services resumed. Staff member states that the patient cannot utilize the service due to her gong to [employer] under her [employer] benefits.  CSW confirmed [facility] Pauline Aus can provide patient with transportation to and from HD beginning Monday 11/10/2021. Patient will go to [facility] via [place] at 3:30pm. Patient's room number is [place], [place] is RN and the number to call for report is [phone].   RN aware of discharge plan.  CSW spoke with Reola Calkins at [facility] who is in agreement with discharge plan.  9:45am: CSW received approval from [facility] for placement at [facility].  CSW spoke with Juliann Pulse at [facility] who states the facility cannot accept patient due to her requiring long term care.  CSW spoke with Helene Kelp at [facility] who states the facility has LTC beds and will review referral.  8:20am: CSW submitted clinicals for insurance authorization to [facility] for review.  Madilyn Fireman, MSW, LCSW Transitions of Care   Clinical Social Worker II [phone]

## 2021-11-09 DIAGNOSIS — N2581 Secondary hyperparathyroidism of renal origin: Secondary | ICD-10-CM | POA: Diagnosis not present

## 2021-11-09 DIAGNOSIS — N186 End stage renal disease: Secondary | ICD-10-CM | POA: Diagnosis not present

## 2021-11-09 DIAGNOSIS — D509 Iron deficiency anemia, unspecified: Secondary | ICD-10-CM | POA: Diagnosis not present

## 2021-11-09 DIAGNOSIS — D631 Anemia in chronic kidney disease: Secondary | ICD-10-CM | POA: Diagnosis not present

## 2021-11-09 DIAGNOSIS — E785 Hyperlipidemia, unspecified: Secondary | ICD-10-CM | POA: Diagnosis not present

## 2021-11-10 ENCOUNTER — Telehealth: Payer: Self-pay | Admitting: *Deleted

## 2021-11-10 NOTE — Telephone Encounter (Signed)
Angel a Marine scientist from [facility] called to let us know patient is continually removing her dressing . Nursing staff are reapplying dressing and trying to keep area clean but patient just removes it. They will notify us if patient develops any signs of infection to surgical site. Patient has follow up appt 11/16/21.

## 2021-11-11 DIAGNOSIS — N2581 Secondary hyperparathyroidism of renal origin: Secondary | ICD-10-CM | POA: Diagnosis not present

## 2021-11-11 DIAGNOSIS — D631 Anemia in chronic kidney disease: Secondary | ICD-10-CM | POA: Diagnosis not present

## 2021-11-11 DIAGNOSIS — D509 Iron deficiency anemia, unspecified: Secondary | ICD-10-CM | POA: Diagnosis not present

## 2021-11-11 DIAGNOSIS — N186 End stage renal disease: Secondary | ICD-10-CM | POA: Diagnosis not present

## 2021-11-11 DIAGNOSIS — E785 Hyperlipidemia, unspecified: Secondary | ICD-10-CM | POA: Diagnosis not present

## 2021-11-12 DIAGNOSIS — I739 Peripheral vascular disease, unspecified: Secondary | ICD-10-CM | POA: Diagnosis not present

## 2021-11-12 DIAGNOSIS — S98139A Complete traumatic amputation of one unspecified lesser toe, initial encounter: Secondary | ICD-10-CM | POA: Diagnosis not present

## 2021-11-12 DIAGNOSIS — E43 Unspecified severe protein-calorie malnutrition: Secondary | ICD-10-CM | POA: Diagnosis not present

## 2021-11-12 DIAGNOSIS — M869 Osteomyelitis, unspecified: Secondary | ICD-10-CM | POA: Diagnosis not present

## 2021-11-13 ENCOUNTER — Encounter (HOSPITAL_COMMUNITY): Payer: Self-pay | Admitting: Emergency Medicine

## 2021-11-13 ENCOUNTER — Other Ambulatory Visit: Payer: Self-pay

## 2021-11-13 ENCOUNTER — Inpatient Hospital Stay (HOSPITAL_COMMUNITY)
Admission: EM | Admit: 2021-11-13 | Discharge: 2021-11-17 | DRG: 919 | Disposition: A | Discharge status: Skilled Nursing Facility | Payer: Medicare Other | Attending: Internal Medicine | Admitting: Internal Medicine

## 2021-11-13 DIAGNOSIS — E559 Vitamin D deficiency, unspecified: Secondary | ICD-10-CM | POA: Diagnosis present

## 2021-11-13 DIAGNOSIS — E1152 Type 2 diabetes mellitus with diabetic peripheral angiopathy with gangrene: Secondary | ICD-10-CM | POA: Diagnosis not present

## 2021-11-13 DIAGNOSIS — Z90710 Acquired absence of both cervix and uterus: Secondary | ICD-10-CM

## 2021-11-13 DIAGNOSIS — N186 End stage renal disease: Secondary | ICD-10-CM | POA: Diagnosis not present

## 2021-11-13 DIAGNOSIS — R6889 Other general symptoms and signs: Secondary | ICD-10-CM | POA: Diagnosis not present

## 2021-11-13 DIAGNOSIS — E43 Unspecified severe protein-calorie malnutrition: Secondary | ICD-10-CM | POA: Diagnosis present

## 2021-11-13 DIAGNOSIS — I70235 Atherosclerosis of native arteries of right leg with ulceration of other part of foot: Secondary | ICD-10-CM | POA: Diagnosis not present

## 2021-11-13 DIAGNOSIS — Z8349 Family history of other endocrine, nutritional and metabolic diseases: Secondary | ICD-10-CM

## 2021-11-13 DIAGNOSIS — K219 Gastro-esophageal reflux disease without esophagitis: Secondary | ICD-10-CM | POA: Diagnosis present

## 2021-11-13 DIAGNOSIS — F17210 Nicotine dependence, cigarettes, uncomplicated: Secondary | ICD-10-CM | POA: Diagnosis not present

## 2021-11-13 DIAGNOSIS — I132 Hypertensive heart and chronic kidney disease with heart failure and with stage 5 chronic kidney disease, or end stage renal disease: Secondary | ICD-10-CM | POA: Diagnosis present

## 2021-11-13 DIAGNOSIS — Z7982 Long term (current) use of aspirin: Secondary | ICD-10-CM

## 2021-11-13 DIAGNOSIS — L7622 Postprocedural hemorrhage and hematoma of skin and subcutaneous tissue following other procedure: Principal | ICD-10-CM | POA: Diagnosis present

## 2021-11-13 DIAGNOSIS — Z833 Family history of diabetes mellitus: Secondary | ICD-10-CM

## 2021-11-13 DIAGNOSIS — Z888 Allergy status to other drugs, medicaments and biological substances status: Secondary | ICD-10-CM

## 2021-11-13 DIAGNOSIS — Z794 Long term (current) use of insulin: Secondary | ICD-10-CM

## 2021-11-13 DIAGNOSIS — J449 Chronic obstructive pulmonary disease, unspecified: Secondary | ICD-10-CM | POA: Diagnosis present

## 2021-11-13 DIAGNOSIS — Z7251 High risk heterosexual behavior: Secondary | ICD-10-CM

## 2021-11-13 DIAGNOSIS — G4700 Insomnia, unspecified: Secondary | ICD-10-CM | POA: Diagnosis present

## 2021-11-13 DIAGNOSIS — Z89411 Acquired absence of right great toe: Secondary | ICD-10-CM

## 2021-11-13 DIAGNOSIS — T819XXA Unspecified complication of procedure, initial encounter: Secondary | ICD-10-CM

## 2021-11-13 DIAGNOSIS — D631 Anemia in chronic kidney disease: Secondary | ICD-10-CM | POA: Diagnosis not present

## 2021-11-13 DIAGNOSIS — I5032 Chronic diastolic (congestive) heart failure: Secondary | ICD-10-CM | POA: Diagnosis present

## 2021-11-13 DIAGNOSIS — M898X9 Other specified disorders of bone, unspecified site: Secondary | ICD-10-CM | POA: Diagnosis not present

## 2021-11-13 DIAGNOSIS — E1140 Type 2 diabetes mellitus with diabetic neuropathy, unspecified: Secondary | ICD-10-CM | POA: Diagnosis not present

## 2021-11-13 DIAGNOSIS — H5461 Unqualified visual loss, right eye, normal vision left eye: Secondary | ICD-10-CM | POA: Diagnosis not present

## 2021-11-13 DIAGNOSIS — D509 Iron deficiency anemia, unspecified: Secondary | ICD-10-CM | POA: Diagnosis not present

## 2021-11-13 DIAGNOSIS — E785 Hyperlipidemia, unspecified: Secondary | ICD-10-CM | POA: Diagnosis present

## 2021-11-13 DIAGNOSIS — E1151 Type 2 diabetes mellitus with diabetic peripheral angiopathy without gangrene: Secondary | ICD-10-CM | POA: Diagnosis not present

## 2021-11-13 DIAGNOSIS — E1122 Type 2 diabetes mellitus with diabetic chronic kidney disease: Secondary | ICD-10-CM | POA: Diagnosis present

## 2021-11-13 DIAGNOSIS — Z79899 Other long term (current) drug therapy: Secondary | ICD-10-CM

## 2021-11-13 DIAGNOSIS — N2581 Secondary hyperparathyroidism of renal origin: Secondary | ICD-10-CM | POA: Diagnosis not present

## 2021-11-13 DIAGNOSIS — T8781 Dehiscence of amputation stump: Secondary | ICD-10-CM | POA: Diagnosis not present

## 2021-11-13 DIAGNOSIS — F419 Anxiety disorder, unspecified: Secondary | ICD-10-CM | POA: Diagnosis present

## 2021-11-13 DIAGNOSIS — E11319 Type 2 diabetes mellitus with unspecified diabetic retinopathy without macular edema: Secondary | ICD-10-CM | POA: Diagnosis not present

## 2021-11-13 DIAGNOSIS — T8130XA Disruption of wound, unspecified, initial encounter: Secondary | ICD-10-CM | POA: Diagnosis present

## 2021-11-13 DIAGNOSIS — T8131XA Disruption of external operation (surgical) wound, not elsewhere classified, initial encounter: Secondary | ICD-10-CM | POA: Diagnosis not present

## 2021-11-13 DIAGNOSIS — K5909 Other constipation: Secondary | ICD-10-CM | POA: Diagnosis present

## 2021-11-13 DIAGNOSIS — Z992 Dependence on renal dialysis: Secondary | ICD-10-CM | POA: Diagnosis not present

## 2021-11-13 DIAGNOSIS — I898 Other specified noninfective disorders of lymphatic vessels and lymph nodes: Secondary | ICD-10-CM | POA: Diagnosis present

## 2021-11-13 DIAGNOSIS — L7682 Other postprocedural complications of skin and subcutaneous tissue: Secondary | ICD-10-CM | POA: Diagnosis not present

## 2021-11-13 DIAGNOSIS — B182 Chronic viral hepatitis C: Secondary | ICD-10-CM | POA: Diagnosis present

## 2021-11-13 DIAGNOSIS — I739 Peripheral vascular disease, unspecified: Secondary | ICD-10-CM | POA: Diagnosis present

## 2021-11-13 DIAGNOSIS — F32A Depression, unspecified: Secondary | ICD-10-CM | POA: Diagnosis present

## 2021-11-13 DIAGNOSIS — Z20822 Contact with and (suspected) exposure to covid-19: Secondary | ICD-10-CM | POA: Diagnosis not present

## 2021-11-13 DIAGNOSIS — Z743 Need for continuous supervision: Secondary | ICD-10-CM | POA: Diagnosis not present

## 2021-11-13 LAB — BASIC METABOLIC PANEL
Anion gap: 15 (ref 5–15)
BUN: 18 mg/dL (ref 6–20)
CO2: 25 mmol/L (ref 22–32)
Calcium: 10 mg/dL (ref 8.9–10.3)
Chloride: 99 mmol/L (ref 98–111)
Creatinine, Ser: 4.61 mg/dL — ABNORMAL HIGH (ref 0.44–1.00)
GFR, Estimated: 10 mL/min — ABNORMAL LOW (ref >60)
Glucose, Bld: 207 mg/dL — ABNORMAL HIGH (ref 70–99)
Potassium: 4.1 mmol/L (ref 3.5–5.1)
Sodium: 139 mmol/L (ref 135–145)

## 2021-11-13 LAB — CBC WITH DIFFERENTIAL/PLATELET
Abs Immature Granulocytes: 0.07 10*3/uL (ref 0.00–0.07)
Basophils Absolute: 0.1 10*3/uL (ref 0.0–0.1)
Basophils Relative: 1 %
Eosinophils Absolute: 0.3 10*3/uL (ref 0.0–0.5)
Eosinophils Relative: 2 %
HCT: 32.1 % — ABNORMAL LOW (ref 36.0–46.0)
Hemoglobin: 9.8 g/dL — ABNORMAL LOW (ref 12.0–15.0)
Immature Granulocytes: 1 %
Lymphocytes Relative: 10 %
Lymphs Abs: 1.5 10*3/uL (ref 0.7–4.0)
MCH: 28 pg (ref 26.0–34.0)
MCHC: 30.5 g/dL (ref 30.0–36.0)
MCV: 91.7 fL (ref 80.0–100.0)
Monocytes Absolute: 1.2 10*3/uL — ABNORMAL HIGH (ref 0.1–1.0)
Monocytes Relative: 8 %
Neutro Abs: 11.2 10*3/uL — ABNORMAL HIGH (ref 1.7–7.7)
Neutrophils Relative %: 78 %
Platelets: 576 10*3/uL — ABNORMAL HIGH (ref 150–400)
RBC: 3.5 MIL/uL — ABNORMAL LOW (ref 3.87–5.11)
RDW: 17.1 % — ABNORMAL HIGH (ref 11.5–15.5)
WBC: 14.3 10*3/uL — ABNORMAL HIGH (ref 4.0–10.5)
nRBC: 0 % (ref 0.0–0.2)

## 2021-11-13 NOTE — ED Provider Triage Note (Signed)
Emergency Medicine Provider Triage Evaluation Note  Tammy Moses , a 56 y.o. female  was evaluated in triage.  Pt complains of infection at surgery site  Review of Systems  Positive: pain Negative: fever  Physical Exam  BP (!) 167/68 (BP Location: Right Arm)    Pulse 79    Temp 97.7 F (36.5 C) (Oral)    Resp 19    SpO2 100%  Gen:   Awake, no distress   Resp:  Normal effort  MSK:   Moves extremities without difficulty  Other:    Medical Decision Making  Medically screening exam initiated at 8:57 PM.  Appropriate orders placed.  ILIZA BLANKENBECKLER was informed that the remainder of the evaluation will be completed by another provider, this initial triage assessment does not replace that evaluation, and the importance of remaining in the ED until their evaluation is complete.     Fransico Meadow, [place] 11/13/21 2058

## 2021-11-13 NOTE — ED Triage Notes (Signed)
Patient arrived with EMS from [facility] home , right groin   surgery last week , reports bleeding ay surgical site today , dressing applied by staff at nursing home .

## 2021-11-14 ENCOUNTER — Emergency Department (HOSPITAL_COMMUNITY): Payer: Medicare Other | Admitting: Certified Registered"

## 2021-11-14 ENCOUNTER — Encounter (HOSPITAL_COMMUNITY): Payer: Self-pay

## 2021-11-14 ENCOUNTER — Encounter (HOSPITAL_COMMUNITY)
Admission: EM | Disposition: A | Discharge status: Skilled Nursing Facility | Payer: Self-pay | Source: Home / Self Care | Attending: Internal Medicine

## 2021-11-14 DIAGNOSIS — N186 End stage renal disease: Secondary | ICD-10-CM | POA: Diagnosis not present

## 2021-11-14 DIAGNOSIS — T8130XA Disruption of wound, unspecified, initial encounter: Secondary | ICD-10-CM | POA: Diagnosis present

## 2021-11-14 DIAGNOSIS — E43 Unspecified severe protein-calorie malnutrition: Secondary | ICD-10-CM | POA: Diagnosis not present

## 2021-11-14 DIAGNOSIS — L7622 Postprocedural hemorrhage and hematoma of skin and subcutaneous tissue following other procedure: Secondary | ICD-10-CM | POA: Diagnosis not present

## 2021-11-14 DIAGNOSIS — I70235 Atherosclerosis of native arteries of right leg with ulceration of other part of foot: Secondary | ICD-10-CM | POA: Diagnosis not present

## 2021-11-14 DIAGNOSIS — Z7982 Long term (current) use of aspirin: Secondary | ICD-10-CM | POA: Diagnosis not present

## 2021-11-14 DIAGNOSIS — J449 Chronic obstructive pulmonary disease, unspecified: Secondary | ICD-10-CM | POA: Diagnosis not present

## 2021-11-14 DIAGNOSIS — E11319 Type 2 diabetes mellitus with unspecified diabetic retinopathy without macular edema: Secondary | ICD-10-CM | POA: Diagnosis not present

## 2021-11-14 DIAGNOSIS — I132 Hypertensive heart and chronic kidney disease with heart failure and with stage 5 chronic kidney disease, or end stage renal disease: Secondary | ICD-10-CM | POA: Diagnosis not present

## 2021-11-14 DIAGNOSIS — D631 Anemia in chronic kidney disease: Secondary | ICD-10-CM | POA: Diagnosis not present

## 2021-11-14 DIAGNOSIS — L7682 Other postprocedural complications of skin and subcutaneous tissue: Secondary | ICD-10-CM | POA: Diagnosis not present

## 2021-11-14 DIAGNOSIS — Z20822 Contact with and (suspected) exposure to covid-19: Secondary | ICD-10-CM | POA: Diagnosis not present

## 2021-11-14 DIAGNOSIS — F32A Depression, unspecified: Secondary | ICD-10-CM | POA: Diagnosis not present

## 2021-11-14 DIAGNOSIS — F17210 Nicotine dependence, cigarettes, uncomplicated: Secondary | ICD-10-CM | POA: Diagnosis not present

## 2021-11-14 DIAGNOSIS — I898 Other specified noninfective disorders of lymphatic vessels and lymph nodes: Secondary | ICD-10-CM | POA: Diagnosis not present

## 2021-11-14 DIAGNOSIS — T8131XA Disruption of external operation (surgical) wound, not elsewhere classified, initial encounter: Secondary | ICD-10-CM | POA: Diagnosis not present

## 2021-11-14 DIAGNOSIS — Z79899 Other long term (current) drug therapy: Secondary | ICD-10-CM | POA: Diagnosis not present

## 2021-11-14 DIAGNOSIS — Z992 Dependence on renal dialysis: Secondary | ICD-10-CM | POA: Diagnosis not present

## 2021-11-14 DIAGNOSIS — Z794 Long term (current) use of insulin: Secondary | ICD-10-CM | POA: Diagnosis not present

## 2021-11-14 DIAGNOSIS — E1152 Type 2 diabetes mellitus with diabetic peripheral angiopathy with gangrene: Secondary | ICD-10-CM | POA: Diagnosis not present

## 2021-11-14 HISTORY — PX: GROIN DEBRIDEMENT: SHX5159

## 2021-11-14 LAB — GLUCOSE, CAPILLARY
Glucose-Capillary: 112 mg/dL — ABNORMAL HIGH (ref 70–99)
Glucose-Capillary: 116 mg/dL — ABNORMAL HIGH (ref 70–99)
Glucose-Capillary: 189 mg/dL — ABNORMAL HIGH (ref 70–99)

## 2021-11-14 SURGERY — DEBRIDEMENT, INGUINAL REGION
Anesthesia: General | Site: Groin | Laterality: Right

## 2021-11-14 MED ORDER — DEXAMETHASONE SODIUM PHOSPHATE 10 MG/ML IJ SOLN
INTRAMUSCULAR | Status: DC | PRN
  Administered 2021-11-14: 5 mg via INTRAVENOUS

## 2021-11-14 MED ORDER — ONDANSETRON HCL 4 MG/2ML IJ SOLN
INTRAMUSCULAR | Status: DC | PRN
Start: 2021-11-14 — End: 2021-11-14
  Administered 2021-11-14: 4 mg via INTRAVENOUS

## 2021-11-14 MED ORDER — PHENYLEPHRINE 40 MCG/ML (10ML) SYRINGE FOR IV PUSH (FOR BLOOD PRESSURE SUPPORT)
PREFILLED_SYRINGE | INTRAVENOUS | Status: DC | PRN
  Administered 2021-11-14: 160 ug via INTRAVENOUS
  Administered 2021-11-14 (×2): 120 ug via INTRAVENOUS

## 2021-11-14 MED ORDER — FENTANYL CITRATE (PF) 100 MCG/2ML IJ SOLN
INTRAMUSCULAR | Status: DC | PRN
  Administered 2021-11-14: 100 ug via INTRAVENOUS
  Administered 2021-11-14: 50 ug via INTRAVENOUS

## 2021-11-14 MED ORDER — LIDOCAINE 2% (20 MG/ML) 5 ML SYRINGE
INTRAMUSCULAR | Status: AC
  Filled 2021-11-14: qty 5

## 2021-11-14 MED ORDER — FENTANYL CITRATE (PF) 250 MCG/5ML IJ SOLN
INTRAMUSCULAR | Status: AC
  Filled 2021-11-14: qty 5

## 2021-11-14 MED ORDER — SODIUM CHLORIDE 0.9 % IV SOLN: INTRAVENOUS | Status: DC

## 2021-11-14 MED ORDER — DORZOLAMIDE HCL-TIMOLOL MAL 2-0.5 % OP SOLN
1.0000 [drp] | Freq: Two times a day (BID) | OPHTHALMIC | Status: DC
  Administered 2021-11-14 – 2021-11-17 (×6): 1 [drp] via OPHTHALMIC
  Filled 2021-11-14: qty 10

## 2021-11-14 MED ORDER — CEFAZOLIN SODIUM-DEXTROSE 2-3 GM-%(50ML) IV SOLR
INTRAVENOUS | Status: DC | PRN
  Administered 2021-11-14: 2 g via INTRAVENOUS

## 2021-11-14 MED ORDER — ATORVASTATIN CALCIUM 80 MG PO TABS
80.0000 mg | ORAL_TABLET | Freq: Every day | ORAL | Status: DC
  Administered 2021-11-14 – 2021-11-17 (×4): 80 mg via ORAL
  Filled 2021-11-14 (×4): qty 1

## 2021-11-14 MED ORDER — HYDROMORPHONE HCL 2 MG PO TABS
1.0000 mg | ORAL_TABLET | ORAL | Status: DC | PRN
  Administered 2021-11-14 (×2): 1 mg via ORAL
  Filled 2021-11-14 (×2): qty 1

## 2021-11-14 MED ORDER — DEXAMETHASONE SODIUM PHOSPHATE 10 MG/ML IJ SOLN
INTRAMUSCULAR | Status: AC
  Filled 2021-11-14: qty 1

## 2021-11-14 MED ORDER — FUROSEMIDE 40 MG PO TABS
40.0000 mg | ORAL_TABLET | Freq: Two times a day (BID) | ORAL | Status: DC
  Administered 2021-11-14 – 2021-11-17 (×5): 40 mg via ORAL
  Filled 2021-11-14 (×5): qty 1

## 2021-11-14 MED ORDER — LOSARTAN POTASSIUM 50 MG PO TABS
100.0000 mg | ORAL_TABLET | Freq: Every day | ORAL | Status: DC
  Administered 2021-11-14 – 2021-11-17 (×4): 100 mg via ORAL
  Filled 2021-11-14 (×4): qty 2

## 2021-11-14 MED ORDER — SENNOSIDES-DOCUSATE SODIUM 8.6-50 MG PO TABS: 1.0000 | ORAL_TABLET | Freq: Every day | ORAL | Status: DC | PRN

## 2021-11-14 MED ORDER — OXYCODONE HCL 5 MG/5ML PO SOLN: 5.0000 mg | Freq: Once | ORAL | Status: DC | PRN

## 2021-11-14 MED ORDER — CEFAZOLIN SODIUM 1 G IJ SOLR
INTRAMUSCULAR | Status: AC
  Filled 2021-11-14: qty 20

## 2021-11-14 MED ORDER — LATANOPROST 0.005 % OP SOLN
1.0000 [drp] | Freq: Every day | OPHTHALMIC | Status: DC
  Administered 2021-11-14 – 2021-11-16 (×3): 1 [drp] via OPHTHALMIC
  Filled 2021-11-14: qty 2.5

## 2021-11-14 MED ORDER — ONDANSETRON HCL 4 MG/2ML IJ SOLN
INTRAMUSCULAR | Status: AC
  Filled 2021-11-14: qty 2

## 2021-11-14 MED ORDER — METOCLOPRAMIDE HCL 10 MG PO TABS
10.0000 mg | ORAL_TABLET | Freq: Two times a day (BID) | ORAL | Status: DC
  Administered 2021-11-14: 10 mg via ORAL
  Filled 2021-11-14 (×2): qty 1

## 2021-11-14 MED ORDER — DORZOLAMIDE HCL-TIMOLOL MAL 2-0.5 % OP SOLN: 1.0000 [drp] | Freq: Two times a day (BID) | OPHTHALMIC | Status: DC

## 2021-11-14 MED ORDER — MIDAZOLAM HCL 2 MG/2ML IJ SOLN
INTRAMUSCULAR | Status: AC
  Filled 2021-11-14: qty 2

## 2021-11-14 MED ORDER — SUCCINYLCHOLINE CHLORIDE 200 MG/10ML IV SOSY
PREFILLED_SYRINGE | INTRAVENOUS | Status: DC | PRN
  Administered 2021-11-14: 60 mg via INTRAVENOUS

## 2021-11-14 MED ORDER — BUPROPION HCL ER (SR) 150 MG PO TB12
150.0000 mg | ORAL_TABLET | Freq: Every day | ORAL | Status: DC
  Administered 2021-11-14 – 2021-11-17 (×4): 150 mg via ORAL
  Filled 2021-11-14 (×4): qty 1

## 2021-11-14 MED ORDER — OXYCODONE HCL 5 MG PO TABS
5.0000 mg | ORAL_TABLET | Freq: Four times a day (QID) | ORAL | Status: DC | PRN
  Administered 2021-11-16 – 2021-11-17 (×2): 5 mg via ORAL
  Filled 2021-11-14 (×2): qty 1

## 2021-11-14 MED ORDER — ASPIRIN EC 81 MG PO TBEC
81.0000 mg | DELAYED_RELEASE_TABLET | Freq: Every day | ORAL | Status: DC
  Administered 2021-11-14 – 2021-11-17 (×4): 81 mg via ORAL
  Filled 2021-11-14 (×4): qty 1

## 2021-11-14 MED ORDER — 0.9 % SODIUM CHLORIDE (POUR BTL) OPTIME
TOPICAL | Status: DC | PRN
  Administered 2021-11-14: 1000 mL

## 2021-11-14 MED ORDER — ROCURONIUM BROMIDE 10 MG/ML (PF) SYRINGE
PREFILLED_SYRINGE | INTRAVENOUS | Status: AC
  Filled 2021-11-14: qty 10

## 2021-11-14 MED ORDER — EPHEDRINE SULFATE-NACL 50-0.9 MG/10ML-% IV SOSY
PREFILLED_SYRINGE | INTRAVENOUS | Status: DC | PRN
  Administered 2021-11-14: 15 mg via INTRAVENOUS
  Administered 2021-11-14: 10 mg via INTRAVENOUS

## 2021-11-14 MED ORDER — PREGABALIN 25 MG PO CAPS
25.0000 mg | ORAL_CAPSULE | Freq: Every day | ORAL | Status: DC
  Administered 2021-11-14 – 2021-11-17 (×4): 25 mg via ORAL
  Filled 2021-11-14 (×4): qty 1

## 2021-11-14 MED ORDER — LAMOTRIGINE 25 MG PO TABS
25.0000 mg | ORAL_TABLET | Freq: Every day | ORAL | Status: DC
  Administered 2021-11-14 – 2021-11-17 (×4): 25 mg via ORAL
  Filled 2021-11-14 (×4): qty 1

## 2021-11-14 MED ORDER — RENA-VITE PO TABS
1.0000 | ORAL_TABLET | Freq: Every day | ORAL | Status: DC
  Administered 2021-11-14 – 2021-11-16 (×3): 1 via ORAL
  Filled 2021-11-14 (×3): qty 1

## 2021-11-14 MED ORDER — FENTANYL CITRATE (PF) 100 MCG/2ML IJ SOLN: 25.0000 ug | INTRAMUSCULAR | Status: DC | PRN

## 2021-11-14 MED ORDER — PROPOFOL 10 MG/ML IV BOLUS
INTRAVENOUS | Status: AC
  Filled 2021-11-14: qty 20

## 2021-11-14 MED ORDER — OXYCODONE HCL 5 MG PO TABS: 5.0000 mg | ORAL_TABLET | Freq: Once | ORAL | Status: DC | PRN

## 2021-11-14 MED ORDER — CHLORHEXIDINE GLUCONATE 0.12 % MT SOLN
OROMUCOSAL | Status: AC
  Administered 2021-11-14: 15 mL via OROMUCOSAL
  Filled 2021-11-14: qty 15

## 2021-11-14 MED ORDER — ORAL CARE MOUTH RINSE: 15.0000 mL | Freq: Once | OROMUCOSAL | Status: AC

## 2021-11-14 MED ORDER — ALBUTEROL SULFATE (2.5 MG/3ML) 0.083% IN NEBU: 2.5000 mg | INHALATION_SOLUTION | RESPIRATORY_TRACT | Status: DC | PRN

## 2021-11-14 MED ORDER — PROPOFOL 10 MG/ML IV BOLUS
INTRAVENOUS | Status: DC | PRN
  Administered 2021-11-14: 100 mg via INTRAVENOUS

## 2021-11-14 MED ORDER — SODIUM CHLORIDE 0.9 % IV SOLN: INTRAVENOUS | Status: DC | PRN

## 2021-11-14 MED ORDER — CALCIUM ACETATE (PHOS BINDER) 667 MG PO CAPS
2001.0000 mg | ORAL_CAPSULE | Freq: Three times a day (TID) | ORAL | Status: DC
  Administered 2021-11-15 – 2021-11-16 (×5): 2001 mg via ORAL
  Filled 2021-11-14 (×6): qty 3

## 2021-11-14 MED ORDER — PROMETHAZINE HCL 25 MG/ML IJ SOLN: 6.2500 mg | INTRAMUSCULAR | Status: DC | PRN

## 2021-11-14 MED ORDER — AMLODIPINE BESYLATE 10 MG PO TABS
10.0000 mg | ORAL_TABLET | Freq: Every day | ORAL | Status: DC
  Administered 2021-11-14 – 2021-11-17 (×4): 10 mg via ORAL
  Filled 2021-11-14 (×3): qty 1
  Filled 2021-11-14: qty 2

## 2021-11-14 MED ORDER — HYDRALAZINE HCL 50 MG PO TABS
100.0000 mg | ORAL_TABLET | Freq: Three times a day (TID) | ORAL | Status: DC
  Administered 2021-11-14 – 2021-11-17 (×9): 100 mg via ORAL
  Filled 2021-11-14 (×9): qty 2

## 2021-11-14 MED ORDER — LIDOCAINE 2% (20 MG/ML) 5 ML SYRINGE
INTRAMUSCULAR | Status: DC | PRN
  Administered 2021-11-14: 60 mg via INTRAVENOUS

## 2021-11-14 MED ORDER — ACETAMINOPHEN 325 MG PO TABS
650.0000 mg | ORAL_TABLET | ORAL | Status: DC | PRN
  Administered 2021-11-14: 650 mg via ORAL
  Filled 2021-11-14: qty 2

## 2021-11-14 MED ORDER — ACETAMINOPHEN 10 MG/ML IV SOLN: 1000.0000 mg | Freq: Once | INTRAVENOUS | Status: DC | PRN

## 2021-11-14 MED ORDER — ATROPINE SULFATE 1 % OP SOLN
1.0000 [drp] | Freq: Two times a day (BID) | OPHTHALMIC | Status: DC
  Administered 2021-11-14 – 2021-11-17 (×6): 1 [drp] via OPHTHALMIC
  Filled 2021-11-14: qty 2

## 2021-11-14 MED ORDER — HEPARIN SODIUM (PORCINE) 5000 UNIT/ML IJ SOLN
5000.0000 [IU] | Freq: Three times a day (TID) | INTRAMUSCULAR | Status: DC
  Administered 2021-11-15 – 2021-11-16 (×5): 5000 [IU] via SUBCUTANEOUS
  Filled 2021-11-14 (×5): qty 1

## 2021-11-14 MED ORDER — LABETALOL HCL 200 MG PO TABS
200.0000 mg | ORAL_TABLET | Freq: Two times a day (BID) | ORAL | Status: DC
  Administered 2021-11-14 – 2021-11-17 (×7): 200 mg via ORAL
  Filled 2021-11-14 (×7): qty 1

## 2021-11-14 MED ORDER — MULTI-VITAMIN/MINERALS PO TABS
1.0000 | ORAL_TABLET | Freq: Every day | ORAL | Status: DC
Start: 2021-11-14 — End: 2021-11-14

## 2021-11-14 MED ORDER — CHLORHEXIDINE GLUCONATE 0.12 % MT SOLN: 15.0000 mL | Freq: Once | OROMUCOSAL | Status: AC

## 2021-11-14 MED ORDER — SUCCINYLCHOLINE CHLORIDE 200 MG/10ML IV SOSY
PREFILLED_SYRINGE | INTRAVENOUS | Status: AC
  Filled 2021-11-14: qty 10

## 2021-11-14 SURGICAL SUPPLY — 41 items
BAG COUNTER SPONGE SURGICOUNT (BAG) ×2 IMPLANT
BAG SPNG CNTER NS LX DISP (BAG) ×1
BAG SURGICOUNT SPONGE COUNTING (BAG) ×1
BNDG GAUZE ELAST 4 BULKY (GAUZE/BANDAGES/DRESSINGS) IMPLANT
CANISTER SUCT 3000ML PPV (MISCELLANEOUS) ×3 IMPLANT
CANISTER WOUNDNEG PRESSURE 500 (CANNISTER) ×2 IMPLANT
CLIP TI MEDIUM 6 (CLIP) ×2 IMPLANT
CLIP TI WIDE RED SMALL 6 (CLIP) ×2 IMPLANT
COVER SURGICAL LIGHT HANDLE (MISCELLANEOUS) ×2 IMPLANT
DRAPE HALF SHEET 40X57 (DRAPES) ×2 IMPLANT
DRAPE INCISE IOBAN 66X45 STRL (DRAPES) ×2 IMPLANT
DRAPE ORTHO SPLIT 77X108 STRL (DRAPES) ×3
DRAPE SURG ORHT 6 SPLT 77X108 (DRAPES) IMPLANT
DRSG VAC ATS SM SENSATRAC (GAUZE/BANDAGES/DRESSINGS) ×2 IMPLANT
ELECT REM PT RETURN 9FT ADLT (ELECTROSURGICAL) ×3
ELECTRODE REM PT RTRN 9FT ADLT (ELECTROSURGICAL) ×1 IMPLANT
GAUZE 4X4 16PLY ~~LOC~~+RFID DBL (SPONGE) ×2 IMPLANT
GAUZE SPONGE 4X4 12PLY STRL (GAUZE/BANDAGES/DRESSINGS) ×3 IMPLANT
GLOVE SRG 8 PF TXTR STRL LF DI (GLOVE) ×1 IMPLANT
GLOVE SURG ENC MOIS LTX SZ7.5 (GLOVE) ×3 IMPLANT
GLOVE SURG MICRO LTX SZ7.5 (GLOVE) ×2 IMPLANT
GLOVE SURG POLY ORTHO LF SZ7.5 (GLOVE) IMPLANT
GLOVE SURG UNDER LTX SZ8 (GLOVE) ×3 IMPLANT
GLOVE SURG UNDER POLY LF SZ8 (GLOVE) ×3
GOWN STRL REUS W/ TWL LRG LVL3 (GOWN DISPOSABLE) ×3 IMPLANT
GOWN STRL REUS W/TWL LRG LVL3 (GOWN DISPOSABLE) ×9
KIT BASIN OR (CUSTOM PROCEDURE TRAY) ×3 IMPLANT
KIT TURNOVER KIT B (KITS) ×3 IMPLANT
NS IRRIG 1000ML POUR BTL (IV SOLUTION) ×3 IMPLANT
PACK GENERAL/GYN (CUSTOM PROCEDURE TRAY) ×3 IMPLANT
PACK UNIVERSAL I (CUSTOM PROCEDURE TRAY) ×1 IMPLANT
PAD ARMBOARD 7.5X6 YLW CONV (MISCELLANEOUS) ×6 IMPLANT
SPONGE LAP 18X18 X RAY DECT (DISPOSABLE) ×2 IMPLANT
STAPLER VISISTAT (STAPLE) IMPLANT
SUT ETHILON 3 0 PS 1 (SUTURE) IMPLANT
SUT MNCRL AB 4-0 PS2 18 (SUTURE) IMPLANT
SUT VIC AB 2-0 CTB1 (SUTURE) IMPLANT
SUT VIC AB 3-0 SH 27 (SUTURE) ×3
SUT VIC AB 3-0 SH 27X BRD (SUTURE) IMPLANT
TOWEL GREEN STERILE (TOWEL DISPOSABLE) ×3 IMPLANT
WATER STERILE IRR 1000ML POUR (IV SOLUTION) ×3 IMPLANT

## 2021-11-14 NOTE — Anesthesia Preprocedure Evaluation (Addendum)
Anesthesia Evaluation  Patient identified by MRN, date of birth, ID band Patient awake    Reviewed: Allergy & Precautions, NPO status , Patient's Chart, lab work & pertinent test results  Airway Mallampati: III  TM Distance: >3 FB Neck ROM: Full    Dental  (+) Loose   Pulmonary asthma , COPD,  COPD inhaler, Current Smoker,    Pulmonary exam normal breath sounds clear to auscultation       Cardiovascular hypertension, Pt. on medications and Pt. on home beta blockers + Peripheral Vascular Disease  Normal cardiovascular exam Rhythm:Regular Rate:Normal     Neuro/Psych PSYCHIATRIC DISORDERS Anxiety Depression  Neuromuscular disease    GI/Hepatic PUD, (+) Hepatitis -  Endo/Other  diabetes, Insulin Dependent  Renal/GU Renal disease     Musculoskeletal negative musculoskeletal ROS (+)   Abdominal   Peds  Hematology  (+) anemia ,   Anesthesia Other Findings Right Groin Dehiscence  Reproductive/Obstetrics                            Anesthesia Physical Anesthesia Plan  ASA: 3  Anesthesia Plan: General   Post-op Pain Management:    Induction: Intravenous  PONV Risk Score and Plan: 2 and Ondansetron, Dexamethasone and Treatment may vary due to age or medical condition  Airway Management Planned: Oral ETT and Video Laryngoscope Planned  Additional Equipment:   Intra-op Plan:   Post-operative Plan: Extubation in OR  Informed Consent: I have reviewed the patients History and Physical, chart, labs and discussed the procedure including the risks, benefits and alternatives for the proposed anesthesia with the patient or authorized representative who has indicated his/her understanding and acceptance.     Dental advisory given  Plan Discussed with: CRNA  Anesthesia Plan Comments:        Anesthesia Quick Evaluation

## 2021-11-14 NOTE — ED Notes (Signed)
RN witnessed EDP's exam of R groin wound.

## 2021-11-14 NOTE — H&P (Signed)
Date: 11/14/2021               Patient Name:  Tammy Moses MRN: [id number]  DOB: 1965-12-26 Age / Sex: 56 y.o., female   PCP: [name], FNP         Medical Service: Internal Medicine Teaching Service         Attending Physician: Dr. Johnnette Gourd    First Contact: Christiana Fuchs, DO Pager: KM [phone]  Second Contact: Gaylan Gerold, DO Pager: Liliane Shi [phone]       After Hours (After 5p/  First Contact Pager: [phone]  weekends / holidays): Second Contact Pager: [phone]   SUBJECTIVE  Chief Complaint: Bleeding from right groin   History of Present Illness: Tammy Moses is a 56 y.o. with pertinent PMH of ESRD on HD MWF, type 2 diabetes hypertension, normocytic anemia secondary to CKD, COPD, blindness OD, tobacco use disorder who presented with bleeding from right groin surgical site.  Patient underwent right femoral endarterectomy with angioplasty of tibial artery and amputation of right to 10/30/21.  She states that she was sleeping and woke up with the right wound open and draining brown fluid. She thinks that she rolled on it in her sleep. She states she has had hot flashes over the last few day, but no fevers or chills that she knows off.  In the ED, the patient was hypertensive to the 200/60 with HR 86. Labs demonstrated leukocytosis of 14.3 with neutrophil predominance, hemoglobin of 9.8, and platelets of 576. EDP spoke with Dr. [name] of vascular team and patient was taken to OR.  Medications: No current facility-administered medications on file prior to encounter.   Current Outpatient Medications on File Prior to Encounter  Medication Sig Dispense Refill   acetaminophen (TYLENOL) 325 MG tablet Take 2 tablets (650 mg total) by mouth every 4 (four) hours as needed for headache or mild pain.     albuterol (VENTOLIN HFA) 108 (90 Base) MCG/ACT inhaler Inhale 2 puffs into the lungs every 4 (four) hours as needed for shortness of breath. For shortness of breath (Patient taking  differently: Inhale 2 puffs into the lungs every 4 (four) hours as needed for shortness of breath or wheezing.) 8 g 11   amLODipine (NORVASC) 10 MG tablet Take 1 tablet (10 mg total) by mouth daily.     aspirin EC 81 MG EC tablet Take 1 tablet (81 mg total) by mouth daily. Swallow whole. 30 tablet 11   atorvastatin (LIPITOR) 80 MG tablet Take 1 tablet (80 mg total) by mouth daily.     atropine 1 % ophthalmic solution Place 1 drop into Moses eyes 2 (two) times daily.     buPROPion (WELLBUTRIN SR) 150 MG 12 hr tablet Take 1 tablet (150 mg total) by mouth daily.     calcium acetate (PHOSLO) 667 MG capsule Take 3 capsules (2,001 mg total) by mouth 3 (three) times daily with meals.     dorzolamide-timolol (COSOPT) 22.3-6.8 MG/ML ophthalmic solution Place 1 drop into Moses eyes 2 (two) times daily.     hydrALAZINE (APRESOLINE) 100 MG tablet Take 1 tablet (100 mg total) by mouth every 8 (eight) hours. (Patient taking differently: Take 100 mg by mouth 3 (three) times daily.)     HYDROmorphone (DILAUDID) 2 MG tablet Take 0.5 tablets (1 mg total) by mouth every 4 (four) hours as needed for severe pain. 30 tablet 0   insulin lispro (HUMALOG) 100 UNIT/ML injection Inject into the skin 3 (  three) times daily before meals. Inject as per sliding scale 150-200=2 units 201-250=4 units 251-300=6 units 301-350=8 units 351-400=10 units     labetalol (NORMODYNE) 200 MG tablet Take 1 tablet (200 mg total) by mouth 2 (two) times daily.     lamoTRIgine (LAMICTAL) 25 MG tablet Take 25 mg by mouth daily.     latanoprost (XALATAN) 0.005 % ophthalmic solution Place 1 drop into Moses eyes at bedtime.     losartan (COZAAR) 100 MG tablet Take 1 tablet (100 mg total) by mouth daily.     metoCLOPramide (REGLAN) 10 MG tablet Take 10 mg by mouth in the morning and at bedtime.     Multiple Vitamins-Minerals (MULTIVITAMIN WITH MINERALS) tablet Take 1 tablet by mouth daily.     multivitamin (RENA-VIT) TABS tablet Take 1 tablet by mouth  at bedtime. (Patient taking differently: Take 1 tablet by mouth daily.)  0   pregabalin (LYRICA) 25 MG capsule Take 1 capsule (25 mg total) by mouth at bedtime. (Patient taking differently: Take 25 mg by mouth daily.) 30 capsule 0   senna-docusate (SENOKOT-S) 8.6-50 MG tablet Take 1 tablet by mouth at bedtime as needed for mild constipation. (Patient taking differently: Take 1 tablet by mouth daily as needed for mild constipation.)     furosemide (LASIX) 40 MG tablet Take 40 mg by mouth 2 (two) times daily. (Patient not taking: Reported on 11/14/2021)      Past Medical History:  Past Medical History:  Diagnosis Date   Anemia    Anxiety    Blind right eye    Chronic kidney disease, stage V (HCC)    Constipation, chronic    Dental caries    Diabetes mellitus    Diabetic neuropathy (HCC)    Diabetic retinopathy    GERD (gastroesophageal reflux disease)    Glaucoma    H. pylori infection    Hepatitis C carrier ([place])    High risk sexual behavior    Hyperlipidemia    Hypertension    Hypoglycemia 07/12/2017   Insomnia    Microalbuminuria    Nonspecific elevation of levels of transaminase or lactic acid dehydrogenase (LDH)    Tobacco dependence    Vitamin D deficiency     Social:  Lives - at [facility] Occupation - none Support - children and grandchildren Level of function - dependent in ADLs PCP - Clovis Riley, [place] Substance use - currently smokes and 0.25 packs per day  Family History: Family History  Problem Relation Age of Onset   High blood pressure Mother    Diabetes Mother    Thyroid disease Mother    Diabetes Father    High blood pressure Father    Cerebral palsy Daughter    Other Son        still born    Allergies: Allergies as of 11/13/2021 - Review Complete 10/30/2021  Allergen Reaction Noted   Acyclovir and related Itching 06/13/2018    Review of Systems: A complete ROS was negative except as per HPI.   OBJECTIVE:  Physical Exam: Blood pressure  (!) 176/64, pulse 87, temperature 99.5 F (37.5 C), temperature source Oral, resp. rate 10, height 5' 0.98" (1.549 m), weight 51.8 kg, SpO2 99 %. Constitutional: well-appearing, sitting in bed, in no acute distress HENT: normocephalic atraumatic, mucous membranes moist Eyes: opacification OD, able to track with OS Neck: supple Cardiovascular: regular rate and rhythm, no m/r/g Pulmonary/Chest: normal work of breathing on room air, lungs clear to auscultation bilaterally Abdominal:  soft, non-tender, non-distended MSK: normal bulk and tone Neurological: alert & oriented x 3 Skin: warm and dry Psych: normal mood and affect   Pertinent Labs: CBC    Component Value Date/Time   WBC 14.3 (H) 11/13/2021 2118   RBC 3.50 (L) 11/13/2021 2118   HGB 9.8 (L) 11/13/2021 2118   HGB 12.4 12/13/2017 1416   HCT 32.1 (L) 11/13/2021 2118   HCT 37.8 12/13/2017 1416   PLT 576 (H) 11/13/2021 2118   PLT 254 12/13/2017 1416   MCV 91.7 11/13/2021 2118   MCV 83 12/13/2017 1416   MCH 28.0 11/13/2021 2118   MCHC 30.5 11/13/2021 2118   RDW 17.1 (H) 11/13/2021 2118   RDW 15.8 (H) 12/13/2017 1416   LYMPHSABS 1.5 11/13/2021 2118   LYMPHSABS 4.1 (H) 12/13/2017 1416   MONOABS 1.2 (H) 11/13/2021 2118   EOSABS 0.3 11/13/2021 2118   EOSABS 0.3 12/13/2017 1416   BASOSABS 0.1 11/13/2021 2118   BASOSABS 0.1 12/13/2017 1416     CMP     Component Value Date/Time   NA 139 11/13/2021 2118   NA 146 (H) 05/10/2017 0950   K 4.1 11/13/2021 2118   CL 99 11/13/2021 2118   CO2 25 11/13/2021 2118   GLUCOSE 207 (H) 11/13/2021 2118   BUN 18 11/13/2021 2118   BUN 38 (H) 05/10/2017 0950   CREATININE 4.61 (H) 11/13/2021 2118   CREATININE 3.40 (H) 03/11/2017 1505   CALCIUM 10.0 11/13/2021 2118   PROT 6.9 10/25/2021 0200   PROT 6.9 05/10/2017 0950   ALBUMIN 2.9 (L) 11/06/2021 0914   ALBUMIN 3.9 05/10/2017 0950   AST 17 10/25/2021 0200   ALT 15 10/25/2021 0200   ALKPHOS 67 10/25/2021 0200   BILITOT 0.5 10/25/2021  0200   BILITOT <0.2 05/10/2017 0950   GFRNONAA 10 (L) 11/13/2021 2118   GFRNONAA 15 (L) 03/11/2017 1505   GFRAA 5 (L) 05/05/2020 1314   GFRAA 17 (L) 03/11/2017 1505    Pertinent Imaging: No results found.   ASSESSMENT & PLAN:  Assessment: Principal Problem:   Wound dehiscence   Tammy Moses is a 56 y.o. with pertinent PMH of ESRD on HD MWF, type 2 diabetes hypertension, normocytic anemia secondary to CKD, COPD, blindness OD, tobacco use disorder who presented with bleeding from right groin surgical site and admit for right groin wound dehiscence on hospital day 0  Plan: #Right groin wound dehiscence with underlying hematoma #Nonhealing right great toe amputation site #Severe PAD On January 6 patient underwent right iliofemoral endarterectomy with vein patch angioplasty.  Angioplasty of right superficial femoral artery and popliteal artery was unsuccessful. She had a right great toe amputation at that time as well. At that time vascular was concerned for high risk of nonhealing that could lead to a below-knee versus above knee amputation. Patient was taken to the OR by Dr. Doren Custard.  Patient was found to have a large lymphocele that was evacuated.  Wound VAC was placed on remaining open wound.  -Repeat BMP, CBC in a.m.  #ESRD HD MWF #Anemia of chronic disease #Severe protein calorie malnutrition Patient receives HD MWF, last HD Friday. K+ 4.1, BUN 18, patient is not volume overload.  -Will consult Nephrology if patient stays past sunday -Renal diet  #Chronic HFpEF #HTN Last EF of 60 -65% 01/23. Home medications include Losartan 100 mg, amlodipine 10 mg, hydralazine 100 mg q 8 hrs, and Labetalol 200 mg. BP elevated in the 200/60s. -Continue amlodipine 10 mg and Labetalol 200 mg -  Lasix 40 mg qd  #Depression -Bupropion 150 mg -Lamotrigine 25 mg -Lyrica 25 mg  #HLD Atorvastatin 80 mg   Best Practice: Diet: Renal diet IVF: none VTE: heparin injection 5,000 Units Start:  11/15/21 0600 Code: Full AB: none Status: Observation with expected length of stay less than 2 midnights. Anticipated Discharge Location: SNF [facility] Barriers to Discharge: Medical stability  Signature: Daleen Bo. Kenyon Eichelberger, D.O.  Internal Medicine Resident, PGY-1 [facility]  Pager: [phone] 6:15 PM, 11/14/2021   Please contact the on call pager after 5 pm and on weekends at [phone].

## 2021-11-14 NOTE — Consult Note (Signed)
ASSESSMENT & PLAN   DEHISCENCE OF RIGHT GROIN WOUND: This patient has dehiscence of her right groin wound with an underlying hematoma.  She is at risk for bleeding.  We will proceed to the operating room for exploration of the wound and reclosure of the wound.  She is at risk for nonhealing because of her severe protein calorie malnutrition.  She is confused.  She understands that she needs to go to the operating room and consents however we obtained the consent from her friend who is the only available person.  I have discussed the indications for the procedure and the potential complications and they are agreeable to proceed.  NONHEALING RIGHT GREAT TOE AMPUTATION SITE:  She is undergone attempted revascularization on the right and as I understand it has no further options for revascularization.  She will likely require a below the knee or above-the-knee amputation as I do not think the toe amputation site is going to heal.  REASON FOR CONSULT:    Dehiscence of right groin wound.  The consult is requested by the emergency department.  HPI:   Tammy Moses is a 56 y.o. female presented with a right big toe ulcer.  On 10/30/2021, she underwent a right iliofemoral endarterectomy with vein patch angioplasty.  She had attempted angioplasty of the right superficial femoral artery and popliteal artery which were unsuccessful.  She also had a right great toe amputation.  She presented to the emergency department this morning with a wound dehiscence.  Vascular surgery was consulted.  Of note the patient is confused.  I am unable to obtain any meaningful history from the patient.  Its not clear wound and the wound dehisced although it looks fairly fresh.  Past Medical History:  Diagnosis Date   Anemia    Anxiety    Blind right eye    Chronic kidney disease, stage V (HCC)    Constipation, chronic    Dental caries    Diabetes mellitus    Diabetic neuropathy (HCC)    Diabetic retinopathy    GERD  (gastroesophageal reflux disease)    Glaucoma    H. pylori infection    Hepatitis C carrier ([place])    High risk sexual behavior    Hyperlipidemia    Hypertension    Hypoglycemia 07/12/2017   Insomnia    Microalbuminuria    Nonspecific elevation of levels of transaminase or lactic acid dehydrogenase (LDH)    Tobacco dependence    Vitamin D deficiency     Family History  Problem Relation Age of Onset   High blood pressure Mother    Diabetes Mother    Thyroid disease Mother    Diabetes Father    High blood pressure Father    Cerebral palsy Daughter    Other Son        still born    SOCIAL HISTORY: Social History   Tobacco Use   Smoking status: Every Day    Packs/day: 0.25    Types: Cigarettes   Smokeless tobacco: Never   Tobacco comments:    trying to quit  Substance Use Topics   Alcohol use: Yes    Comment: 2-3 beers weekly    Allergies  Allergen Reactions   Acyclovir And Related Itching    Current Facility-Administered Medications  Medication Dose Route Frequency Provider Last Rate Last Admin   amLODipine (NORVASC) tablet 10 mg  10 mg Oral Daily Badalamente, Peter R, PA-C       HYDROmorphone (DILAUDID)  tablet 1 mg  1 mg Oral Q4H PRN Loni Beckwith, PA-C   1 mg at 11/14/21 0703   labetalol (NORMODYNE) tablet 200 mg  200 mg Oral BID Loni Beckwith, PA-C       Current Outpatient Medications  Medication Sig Dispense Refill   acetaminophen (TYLENOL) 325 MG tablet Take 2 tablets (650 mg total) by mouth every 4 (four) hours as needed for headache or mild pain.     albuterol (VENTOLIN HFA) 108 (90 Base) MCG/ACT inhaler Inhale 2 puffs into the lungs every 4 (four) hours as needed for shortness of breath. For shortness of breath (Patient taking differently: Inhale 2 puffs into the lungs every 4 (four) hours as needed for shortness of breath or wheezing.) 8 g 11   amLODipine (NORVASC) 10 MG tablet Take 1 tablet (10 mg total) by mouth daily.     aspirin EC 81  MG EC tablet Take 1 tablet (81 mg total) by mouth daily. Swallow whole. 30 tablet 11   atorvastatin (LIPITOR) 80 MG tablet Take 1 tablet (80 mg total) by mouth daily.     atropine 1 % ophthalmic solution Place 1 drop into both eyes 2 (two) times daily.     buPROPion (WELLBUTRIN SR) 150 MG 12 hr tablet Take 1 tablet (150 mg total) by mouth daily.     calcium acetate (PHOSLO) 667 MG capsule Take 3 capsules (2,001 mg total) by mouth 3 (three) times daily with meals.     Darbepoetin Alfa (ARANESP) 100 MCG/0.5ML SOSY injection Inject 0.5 mLs (100 mcg total) into the vein every Monday with hemodialysis. 4.2 mL    dorzolamide-timolol (COSOPT) 22.3-6.8 MG/ML ophthalmic solution Place 1 drop into both eyes 2 (two) times daily.     hydrALAZINE (APRESOLINE) 100 MG tablet Take 1 tablet (100 mg total) by mouth every 8 (eight) hours. (Patient taking differently: Take 100 mg by mouth 3 (three) times daily.)     HYDROmorphone (DILAUDID) 2 MG tablet Take 0.5 tablets (1 mg total) by mouth every 4 (four) hours as needed for severe pain. 30 tablet 0   insulin aspart (NOVOLOG) 100 UNIT/ML injection Inject 0-6 Units into the skin 3 (three) times daily with meals. Do NOT hold if patient is NPO. 10 mL 11   labetalol (NORMODYNE) 200 MG tablet Take 1 tablet (200 mg total) by mouth 2 (two) times daily.     lamoTRIgine (LAMICTAL) 25 MG tablet Take 25 mg by mouth daily.     latanoprost (XALATAN) 0.005 % ophthalmic solution Place 1 drop into both eyes at bedtime.     losartan (COZAAR) 100 MG tablet Take 1 tablet (100 mg total) by mouth daily.     metoCLOPramide (REGLAN) 10 MG tablet Take 10 mg by mouth in the morning and at bedtime.     multivitamin (RENA-VIT) TABS tablet Take 1 tablet by mouth at bedtime.  0   pregabalin (LYRICA) 25 MG capsule Take 1 capsule (25 mg total) by mouth at bedtime. 30 capsule 0   senna-docusate (SENOKOT-S) 8.6-50 MG tablet Take 1 tablet by mouth at bedtime as needed for mild constipation.       REVIEW OF SYSTEMS: Unable to obtain.  PHYSICAL EXAM:   Vitals:   11/13/21 2053 11/14/21 0054 11/14/21 0600 11/14/21 0659  BP: (!) 167/68 (!) 198/43 (!) 228/67   Pulse: 79 80 86   Resp: 19 16 14    Temp: 97.7 F (36.5 C)  99 F (37.2 C) 97.9 F (36.6 C)  TempSrc: Oral   Rectal  SpO2: 100% 100% 100%    There is no height or weight on file to calculate BMI. GENERAL: The patient is a poorly nourished female, in no acute distress. The vital signs are documented above. CARDIAC: There is a regular rate and rhythm.  VASCULAR: The right groin wound has separated. I cannot palpate a popliteal or pedal pulses on the right. I cannot palpate a left femoral pulse.  I cannot palpate pedal pulses on the left.   PULMONARY: There is good air exchange bilaterally without wheezing or rales. ABDOMEN: Soft and non-tender with normal pitched bowel sounds.  MUSCULOSKELETAL: There are no major deformities. NEUROLOGIC: No focal weakness or paresthesias are detected. SKIN: The great toe amputation site on the right has ischemic edges and does not appear to be healing adequately.   PSYCHIATRIC: She is confused.  DATA:    LABS:   Creatinine is 4.61.  White blood cell count 14.3.  Hemoglobin 9.8.  Hematocrit 32.1.  Platelets 576,000.  SARS coronavirus 2 was negative on 10/21/2021.  Deitra Mayo [facility]

## 2021-11-14 NOTE — Progress Notes (Incomplete)
Patient arrived from PACU to 4E20 after vascular surgery with Dr. [name].

## 2021-11-14 NOTE — Anesthesia Procedure Notes (Signed)
Procedure Name: Intubation Date/Time: 11/14/2021 10:31 AM Performed by: Barrington Ellison, CRNA Pre-anesthesia Checklist: Patient identified, Emergency Drugs available, Suction available and Patient being monitored Patient Re-evaluated:Patient Re-evaluated prior to induction Oxygen Delivery Method: Circle System Utilized Preoxygenation: Pre-oxygenation with 100% oxygen Induction Type: IV induction Ventilation: Mask ventilation without difficulty Laryngoscope Size: Glidescope and 3 Grade View: Grade I Tube type: Oral Tube size: 7.0 mm Number of attempts: 1 Airway Equipment and Method: Stylet and Oral airway Placement Confirmation: ETT inserted through vocal cords under direct vision, positive ETCO2 and breath sounds checked- equal and bilateral Secured at: 21 cm Tube secured with: Tape Dental Injury: Teeth and Oropharynx as per pre-operative assessment  Comments: Elective glidescope used d/t poor dentition

## 2021-11-14 NOTE — Anesthesia Postprocedure Evaluation (Signed)
Anesthesia Post Note  Patient: Tammy Moses  Procedure(s) Performed: Wound exploration and closure. (Right: Groin)     Patient location during evaluation: PACU Anesthesia Type: General Level of consciousness: awake Pain management: pain level controlled Vital Signs Assessment: post-procedure vital signs reviewed and stable Respiratory status: spontaneous breathing, nonlabored ventilation, respiratory function stable and patient connected to nasal cannula oxygen Cardiovascular status: blood pressure returned to baseline and stable Postop Assessment: no apparent nausea or vomiting Anesthetic complications: no   No notable events documented.  Last Vitals:  Vitals:   11/14/21 1325 11/14/21 1352  BP: (!) 162/63 (!) 186/53  Pulse:  83  Resp: 15 16  Temp:  37.5 C  SpO2:  100%    Last Pain:  Vitals:   11/14/21 1352  TempSrc: Oral  PainSc: 0-No pain                 Jayln Madeira P Benisha Hadaway

## 2021-11-14 NOTE — Op Note (Signed)
° ° °  NAME: Tammy Moses    MRN: [id number] DOB: 11-04-1965    DATE OF OPERATION: 11/14/2021  PREOP DIAGNOSIS:    Dehiscence right groin wound  POSTOP DIAGNOSIS:    Same Lymphocele right groin  PROCEDURE:    Exploration right groin wound Evacuation of lymphocele Placement of VAC  SURGEON: Judeth Cornfield. [name], MD  ASSIST: None  ANESTHESIA: General  EBL: Minimal  INDICATIONS:    Tammy Moses is a 56 y.o. female who underwent an iliofemoral endarterectomy with vein patch angioplasty on 10/30/2021.  She presented to the emergency department this morning with dehiscence of her wound.  She was taken to the operating room for exploration and closure of the wound.  FINDINGS:   The patient had a very large lymphocele which was evacuated.  The artery and vein patch were intact and were covered with some reasonable tissue.  I closed the deep layer with a running 3-0 Vicryl and then placed a VAC on the superficial layer to help prevent recurrence of the lymphocele.  TECHNIQUE:   The patient was taken to the operating room and received a general anesthetic.  The right groin was prepped and draped in usual sterile fashion.  Upon exploration of the wound the patient had a large lymphocele which tracked superiorly and inferiorly.  This was all evacuated.  No active lymphatic drainage could be identified.  There was no active bleeding.  The vein patch was covered with some reasonably healthy tissue and I did not therefore expose the artery.  I was then able to close the deep layer with a running 3-0 Vicryl.  A VAC was then placed on the remaining open wound to help seal the lymphocele and prevent recurrence.  The VAC had a good seal.  Patient tolerated procedure well and was transferred to recovery room in stable condition.  All needle and sponge counts were correct.  Deitra Mayo, MD, FACS [facility]  DATE OF DICTATION:   11/14/2021

## 2021-11-14 NOTE — ED Provider Notes (Signed)
[facility] EMERGENCY DEPARTMENT Provider Note   CSN: [id number] Arrival date & time: 11/13/21  1946     History  Chief Complaint  Patient presents with   Rightv Groin post op bleeding    Tammy Moses is a 56 y.o. 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  Per chart review patient had right femoral endarterectomy with angioplasty of tibial artery on 10/30/2021 performed by [name].    Patient presents to the emergency department with a chief complaint of postop complication.  Patient states that yesterday her wound " bust open."  Patient states that she noticed purulent material and a small amount of blood after wound opened.  Patient denies any surrounding erythema, warmth, or infection.  Patient denies any swelling, pain, fever, chills, numbness, weakness.  Patient reports that she last went to dialysis on Friday.  HPI     Home Medications Prior to Admission medications   Medication Sig Start Date End Date Taking? Authorizing Provider  acetaminophen (TYLENOL) 325 MG tablet Take 2 tablets (650 mg total) by mouth every 4 (four) hours as needed for headache or mild pain. 11/06/21   Samella Parr, NP  albuterol (VENTOLIN HFA) 108 (90 Base) MCG/ACT inhaler Inhale 2 puffs into the lungs every 4 (four) hours as needed for shortness of breath. For shortness of breath Patient taking differently: Inhale 2 puffs into the lungs every 4 (four) hours as needed for shortness of breath or wheezing. 12/11/19   [name], FNP  amLODipine (NORVASC) 10 MG tablet Take 1 tablet (10 mg total) by mouth daily. 11/06/21   Samella Parr, NP  aspirin EC 81 MG EC tablet Take 1 tablet (81 mg total) by mouth daily. Swallow whole. 11/06/21   Samella Parr, NP  atorvastatin (LIPITOR) 80 MG tablet Take 1 tablet (80 mg total) by mouth daily. 11/06/21   Samella Parr, NP  atropine 1 %  ophthalmic solution Place 1 drop into both eyes 2 (two) times daily.    [provider]  buPROPion (WELLBUTRIN SR) 150 MG 12 hr tablet Take 1 tablet (150 mg total) by mouth daily. 11/06/21   Samella Parr, NP  calcium acetate (PHOSLO) 667 MG capsule Take 3 capsules (2,001 mg total) by mouth 3 (three) times daily with meals. 11/06/21   Samella Parr, NP  Darbepoetin Alfa (ARANESP) 100 MCG/0.5ML SOSY injection Inject 0.5 mLs (100 mcg total) into the vein every Monday with hemodialysis. 11/09/21   Samella Parr, NP  dorzolamide-timolol (COSOPT) 22.3-6.8 MG/ML ophthalmic solution Place 1 drop into both eyes 2 (two) times daily. 12/08/20   [provider]  hydrALAZINE (APRESOLINE) 100 MG tablet Take 1 tablet (100 mg total) by mouth every 8 (eight) hours. Patient taking differently: Take 100 mg by mouth 3 (three) times daily. 10/09/21   Shelly Coss, MD  HYDROmorphone (DILAUDID) 2 MG tablet Take 0.5 tablets (1 mg total) by mouth every 4 (four) hours as needed for severe pain. 11/06/21   Samella Parr, NP  insulin aspart (NOVOLOG) 100 UNIT/ML injection Inject 0-6 Units into the skin 3 (three) times daily with meals. Do NOT hold if patient is NPO. 11/06/21   Samella Parr, NP  labetalol (NORMODYNE) 200 MG tablet Take 1 tablet (200 mg total) by mouth 2 (two) times daily. 11/06/21   Samella Parr, NP  lamoTRIgine (LAMICTAL) 25 MG tablet Take 25  mg by mouth daily. 01/16/21   [provider]  latanoprost (XALATAN) 0.005 % ophthalmic solution Place 1 drop into both eyes at bedtime. 12/16/20   [provider]  losartan (COZAAR) 100 MG tablet Take 1 tablet (100 mg total) by mouth daily. 11/06/21   Samella Parr, NP  metoCLOPramide (REGLAN) 10 MG tablet Take 10 mg by mouth in the morning and at bedtime. 09/22/21   [provider]  multivitamin (RENA-VIT) TABS tablet Take 1 tablet by mouth at bedtime. 11/06/21   Samella Parr, NP  pregabalin (LYRICA) 25 MG  capsule Take 1 capsule (25 mg total) by mouth at bedtime. 11/06/21   Geradine Girt, DO  senna-docusate (SENOKOT-S) 8.6-50 MG tablet Take 1 tablet by mouth at bedtime as needed for mild constipation. 11/06/21   Samella Parr, NP      Allergies    Acyclovir and related    Review of Systems   Review of Systems  Constitutional:  Negative for chills and fever.  Eyes:  Negative for visual disturbance.  Respiratory:  Negative for shortness of breath.   Cardiovascular:  Negative for chest pain.  Gastrointestinal:  Negative for abdominal pain, nausea and vomiting.  Musculoskeletal:  Negative for back pain and neck pain.  Skin:  Positive for wound. Negative for color change, pallor and rash.  Neurological:  Negative for dizziness, syncope, light-headedness and headaches.  Psychiatric/Behavioral:  Negative for confusion.    Physical Exam Updated Vital Signs BP (!) 228/67    Pulse 86    Temp 99 F (37.2 C)    Resp 14    SpO2 100%  Physical Exam Vitals and nursing note reviewed. Exam conducted with a chaperone present (Female RN present as chaperone).  Constitutional:      General: She is not in acute distress.    Appearance: She is not ill-appearing, toxic-appearing or diaphoretic.  HENT:     Head: Normocephalic.  Eyes:     General: No scleral icterus.       Right eye: No discharge.        Left eye: No discharge.  Cardiovascular:     Rate and Rhythm: Normal rate.     Pulses:          Posterior tibial pulses are detected w/ Doppler on the right side and detected w/ Doppler on the left side.  Pulmonary:     Effort: Pulmonary effort is normal.  Musculoskeletal:     Comments: 3.  5 cm wound to right thigh.  No surrounding erythema, warmth, or swelling.  No purulent discharge noted inside or surrounding wound.  Feet:     Right foot:     Skin integrity: Callus and dry skin present. No ulcer, blister, skin breakdown, erythema, warmth or fissure.     Left foot:     Skin integrity: Callus  and dry skin present. No ulcer, blister, skin breakdown, erythema, warmth or fissure.     Comments: Right great toe amputation Skin:    General: Skin is warm and dry.  Neurological:     General: No focal deficit present.     Mental Status: She is alert.  Psychiatric:        Behavior: Behavior is cooperative.      ED Results / Procedures / Treatments   Labs (all labs ordered are listed, but only abnormal results are displayed) Labs Reviewed  CBC WITH DIFFERENTIAL/PLATELET - Abnormal; Notable for the following components:  Result Value   WBC 14.3 (*)    RBC 3.50 (*)    Hemoglobin 9.8 (*)    HCT 32.1 (*)    RDW 17.1 (*)    Platelets 576 (*)    Neutro Abs 11.2 (*)    Monocytes Absolute 1.2 (*)    All other components within normal limits  BASIC METABOLIC PANEL - Abnormal; Notable for the following components:   Glucose, Bld 207 (*)    Creatinine, Ser 4.61 (*)    GFR, Estimated 10 (*)    All other components within normal limits    EKG None  Radiology No results found.  Procedures Procedures    Medications Ordered in ED Medications  amLODipine (NORVASC) tablet 10 mg (has no administration in time range)  labetalol (NORMODYNE) tablet 200 mg (has no administration in time range)  HYDROmorphone (DILAUDID) tablet 1 mg (1 mg Oral Given 11/14/21 0703)    ED Course/ Medical Decision Making/ A&P Clinical Course as of 11/14/21 1146  Sat Nov 14, 2021  0847 Spoke to hospitalist Dr.Ngyun who will see the patient for admission. [PB]    Clinical Course User Index [PB] Loni Beckwith, PA-C                           Medical Decision Making Amount and/or Complexity of Data Reviewed Labs: ordered.  Risk Prescription drug management. Decision regarding hospitalization.   Alert 56 year old female no acute stress, nontoxic-appearing.  Presents to the emergency department with a complaint of postop complication.  Per chart review patient had right femoral  endarterectomy with angioplasty of tibial artery on 10/30/2021 performed by [name].  Patient states that yesterday her wound opened and she noticed purulent material real and a small amount of bleeding.  Patient has medical history of ESRD on HD, diabetes mellitus, hypertension that complicate her care.  Patient's medical record was reviewed including previous lab results and provider notes.  CBC and BMP were ordered and independently reviewed by myself.  Pertinent findings include: -Leukocytosis at 14.3, patient has been noted to have leukocytosis ranging from 15.9 to 13.6 over the last 2 weeks.  Anemia with hemoglobin at 9.8, appears baseline and improved for patient. -BMP shows creatinine elevated 4.61, likely secondary to patient's end-stage renal disease.  Wound dehiscence to right upper thigh as pictured and noted above.  No signs of infection to wound or surrounding skin.  We will reach out to vascular surgery team due to wound dehiscence.  I spoke with Dr. [name] of vascular team who will see the patient.  After evaluating the patient Dr. [name] recommends hospitalist admission.  He plans to take the patient to the operating room.  Spoke to hospitalist Dr.Ngyun        Final Clinical Impression(s) / ED Diagnoses Final diagnoses:  Other postoperative complication of skin    Rx / DC Orders ED Discharge Orders     None         Loni Beckwith, PA-C 11/14/21 1150    Maudie Flakes, MD 11/15/21 0700

## 2021-11-14 NOTE — Transfer of Care (Signed)
Immediate Anesthesia Transfer of Care Note  Patient: Tammy Moses  Procedure(s) Performed: Wound exploration and closure. (Right: Groin)  Patient Location: PACU  Anesthesia Type:General  Level of Consciousness: drowsy and patient cooperative  Airway & Oxygen Therapy: Patient Spontanous Breathing and Patient connected to face mask oxygen  Post-op Assessment: Report given to RN  Post vital signs: Reviewed and stable  Last Vitals:  Vitals Value Taken Time  BP 145/48   Temp    Pulse 74 11/14/21 1123  Resp 16 11/14/21 1123  SpO2 100 % 11/14/21 1123  Vitals shown include unvalidated device data.  Last Pain:  Vitals:   11/14/21 0933  TempSrc: Oral  PainSc:          Complications: No notable events documented.

## 2021-11-15 ENCOUNTER — Encounter (HOSPITAL_COMMUNITY): Payer: Self-pay | Admitting: Vascular Surgery

## 2021-11-15 ENCOUNTER — Other Ambulatory Visit: Payer: Self-pay

## 2021-11-15 DIAGNOSIS — E559 Vitamin D deficiency, unspecified: Secondary | ICD-10-CM | POA: Diagnosis not present

## 2021-11-15 DIAGNOSIS — H44511 Absolute glaucoma, right eye: Secondary | ICD-10-CM | POA: Diagnosis not present

## 2021-11-15 DIAGNOSIS — I10 Essential (primary) hypertension: Secondary | ICD-10-CM | POA: Diagnosis not present

## 2021-11-15 DIAGNOSIS — T8130XD Disruption of wound, unspecified, subsequent encounter: Secondary | ICD-10-CM | POA: Diagnosis not present

## 2021-11-15 DIAGNOSIS — L7682 Other postprocedural complications of skin and subcutaneous tissue: Secondary | ICD-10-CM

## 2021-11-15 DIAGNOSIS — E1151 Type 2 diabetes mellitus with diabetic peripheral angiopathy without gangrene: Secondary | ICD-10-CM | POA: Diagnosis not present

## 2021-11-15 DIAGNOSIS — I959 Hypotension, unspecified: Secondary | ICD-10-CM | POA: Diagnosis not present

## 2021-11-15 DIAGNOSIS — I132 Hypertensive heart and chronic kidney disease with heart failure and with stage 5 chronic kidney disease, or end stage renal disease: Secondary | ICD-10-CM

## 2021-11-15 DIAGNOSIS — J810 Acute pulmonary edema: Secondary | ICD-10-CM | POA: Diagnosis not present

## 2021-11-15 DIAGNOSIS — I739 Peripheral vascular disease, unspecified: Secondary | ICD-10-CM | POA: Diagnosis not present

## 2021-11-15 DIAGNOSIS — E1140 Type 2 diabetes mellitus with diabetic neuropathy, unspecified: Secondary | ICD-10-CM | POA: Diagnosis present

## 2021-11-15 DIAGNOSIS — E785 Hyperlipidemia, unspecified: Secondary | ICD-10-CM | POA: Diagnosis not present

## 2021-11-15 DIAGNOSIS — E1152 Type 2 diabetes mellitus with diabetic peripheral angiopathy with gangrene: Secondary | ICD-10-CM | POA: Diagnosis present

## 2021-11-15 DIAGNOSIS — L7622 Postprocedural hemorrhage and hematoma of skin and subcutaneous tissue following other procedure: Secondary | ICD-10-CM | POA: Diagnosis present

## 2021-11-15 DIAGNOSIS — I120 Hypertensive chronic kidney disease with stage 5 chronic kidney disease or end stage renal disease: Secondary | ICD-10-CM | POA: Diagnosis not present

## 2021-11-15 DIAGNOSIS — Z833 Family history of diabetes mellitus: Secondary | ICD-10-CM | POA: Diagnosis not present

## 2021-11-15 DIAGNOSIS — T8130XA Disruption of wound, unspecified, initial encounter: Secondary | ICD-10-CM

## 2021-11-15 DIAGNOSIS — Z992 Dependence on renal dialysis: Secondary | ICD-10-CM

## 2021-11-15 DIAGNOSIS — Z8349 Family history of other endocrine, nutritional and metabolic diseases: Secondary | ICD-10-CM | POA: Diagnosis not present

## 2021-11-15 DIAGNOSIS — N186 End stage renal disease: Secondary | ICD-10-CM

## 2021-11-15 DIAGNOSIS — L819 Disorder of pigmentation, unspecified: Secondary | ICD-10-CM | POA: Diagnosis not present

## 2021-11-15 DIAGNOSIS — E43 Unspecified severe protein-calorie malnutrition: Secondary | ICD-10-CM | POA: Diagnosis not present

## 2021-11-15 DIAGNOSIS — J449 Chronic obstructive pulmonary disease, unspecified: Secondary | ICD-10-CM | POA: Diagnosis not present

## 2021-11-15 DIAGNOSIS — F17210 Nicotine dependence, cigarettes, uncomplicated: Secondary | ICD-10-CM | POA: Diagnosis present

## 2021-11-15 DIAGNOSIS — I5032 Chronic diastolic (congestive) heart failure: Secondary | ICD-10-CM | POA: Diagnosis not present

## 2021-11-15 DIAGNOSIS — N250 Renal osteodystrophy: Secondary | ICD-10-CM | POA: Diagnosis not present

## 2021-11-15 DIAGNOSIS — T8781 Dehiscence of amputation stump: Secondary | ICD-10-CM | POA: Diagnosis not present

## 2021-11-15 DIAGNOSIS — E1122 Type 2 diabetes mellitus with diabetic chronic kidney disease: Secondary | ICD-10-CM

## 2021-11-15 DIAGNOSIS — I898 Other specified noninfective disorders of lymphatic vessels and lymph nodes: Secondary | ICD-10-CM | POA: Diagnosis present

## 2021-11-15 DIAGNOSIS — S82091D Other fracture of right patella, subsequent encounter for closed fracture with routine healing: Secondary | ICD-10-CM | POA: Diagnosis not present

## 2021-11-15 DIAGNOSIS — H402223 Chronic angle-closure glaucoma, left eye, severe stage: Secondary | ICD-10-CM | POA: Diagnosis not present

## 2021-11-15 DIAGNOSIS — Z20822 Contact with and (suspected) exposure to covid-19: Secondary | ICD-10-CM | POA: Diagnosis present

## 2021-11-15 DIAGNOSIS — Z9889 Other specified postprocedural states: Secondary | ICD-10-CM

## 2021-11-15 DIAGNOSIS — Z48812 Encounter for surgical aftercare following surgery on the circulatory system: Secondary | ICD-10-CM | POA: Diagnosis not present

## 2021-11-15 DIAGNOSIS — M898X9 Other specified disorders of bone, unspecified site: Secondary | ICD-10-CM | POA: Diagnosis present

## 2021-11-15 DIAGNOSIS — Z743 Need for continuous supervision: Secondary | ICD-10-CM | POA: Diagnosis not present

## 2021-11-15 DIAGNOSIS — Z89411 Acquired absence of right great toe: Secondary | ICD-10-CM | POA: Diagnosis not present

## 2021-11-15 DIAGNOSIS — E119 Type 2 diabetes mellitus without complications: Secondary | ICD-10-CM | POA: Diagnosis not present

## 2021-11-15 DIAGNOSIS — F32A Depression, unspecified: Secondary | ICD-10-CM

## 2021-11-15 DIAGNOSIS — D631 Anemia in chronic kidney disease: Secondary | ICD-10-CM

## 2021-11-15 DIAGNOSIS — S82001D Unspecified fracture of right patella, subsequent encounter for closed fracture with routine healing: Secondary | ICD-10-CM | POA: Diagnosis not present

## 2021-11-15 DIAGNOSIS — B182 Chronic viral hepatitis C: Secondary | ICD-10-CM | POA: Diagnosis present

## 2021-11-15 DIAGNOSIS — M21371 Foot drop, right foot: Secondary | ICD-10-CM | POA: Diagnosis not present

## 2021-11-15 DIAGNOSIS — T819XXA Unspecified complication of procedure, initial encounter: Secondary | ICD-10-CM

## 2021-11-15 DIAGNOSIS — H5461 Unqualified visual loss, right eye, normal vision left eye: Secondary | ICD-10-CM | POA: Diagnosis present

## 2021-11-15 DIAGNOSIS — S98111D Complete traumatic amputation of right great toe, subsequent encounter: Secondary | ICD-10-CM | POA: Diagnosis not present

## 2021-11-15 DIAGNOSIS — W19XXXD Unspecified fall, subsequent encounter: Secondary | ICD-10-CM | POA: Diagnosis not present

## 2021-11-15 DIAGNOSIS — G9009 Other idiopathic peripheral autonomic neuropathy: Secondary | ICD-10-CM | POA: Diagnosis not present

## 2021-11-15 DIAGNOSIS — E11319 Type 2 diabetes mellitus with unspecified diabetic retinopathy without macular edema: Secondary | ICD-10-CM | POA: Diagnosis present

## 2021-11-15 DIAGNOSIS — Z79899 Other long term (current) drug therapy: Secondary | ICD-10-CM | POA: Diagnosis not present

## 2021-11-15 LAB — CBC
HCT: 29.6 % — ABNORMAL LOW (ref 36.0–46.0)
Hemoglobin: 9 g/dL — ABNORMAL LOW (ref 12.0–15.0)
MCH: 27.7 pg (ref 26.0–34.0)
MCHC: 30.4 g/dL (ref 30.0–36.0)
MCV: 91.1 fL (ref 80.0–100.0)
Platelets: 630 10*3/uL — ABNORMAL HIGH (ref 150–400)
RBC: 3.25 MIL/uL — ABNORMAL LOW (ref 3.87–5.11)
RDW: 17.1 % — ABNORMAL HIGH (ref 11.5–15.5)
WBC: 16.5 10*3/uL — ABNORMAL HIGH (ref 4.0–10.5)
nRBC: 0 % (ref 0.0–0.2)

## 2021-11-15 LAB — GLUCOSE, CAPILLARY
Glucose-Capillary: 110 mg/dL — ABNORMAL HIGH (ref 70–99)
Glucose-Capillary: 110 mg/dL — ABNORMAL HIGH (ref 70–99)
Glucose-Capillary: 112 mg/dL — ABNORMAL HIGH (ref 70–99)

## 2021-11-15 LAB — BASIC METABOLIC PANEL
Anion gap: 14 (ref 5–15)
BUN: 33 mg/dL — ABNORMAL HIGH (ref 6–20)
CO2: 26 mmol/L (ref 22–32)
Calcium: 10 mg/dL (ref 8.9–10.3)
Chloride: 100 mmol/L (ref 98–111)
Creatinine, Ser: 6.56 mg/dL — ABNORMAL HIGH (ref 0.44–1.00)
GFR, Estimated: 7 mL/min — ABNORMAL LOW (ref >60)
Glucose, Bld: 139 mg/dL — ABNORMAL HIGH (ref 70–99)
Potassium: 4.3 mmol/L (ref 3.5–5.1)
Sodium: 140 mmol/L (ref 135–145)

## 2021-11-15 LAB — PHOSPHORUS: Phosphorus: 5.3 mg/dL — ABNORMAL HIGH (ref 2.5–4.6)

## 2021-11-15 MED ORDER — CHLORHEXIDINE GLUCONATE CLOTH 2 % EX PADS
6.0000 | MEDICATED_PAD | Freq: Every day | CUTANEOUS | Status: DC
  Administered 2021-11-16: 6 via TOPICAL

## 2021-11-15 MED ORDER — QUETIAPINE FUMARATE 25 MG PO TABS
25.0000 mg | ORAL_TABLET | Freq: Every day | ORAL | Status: AC
  Administered 2021-11-15: 25 mg via ORAL
  Filled 2021-11-15: qty 1

## 2021-11-15 NOTE — TOC Initial Note (Signed)
Transition of Care [facility]) - Initial/Assessment Note    Patient Details  Name: Tammy Moses MRN: [id number] Date of Birth: 04/05/1966  Transition [facility]) CM/SW Contact:    Geralynn Ochs, LCSW Phone Number: 11/15/2021, 11:51 AM  Clinical Narrative:      CSW alerted by MD of plan for patient to return to SNF today. CSW contacted [facility], no Admissions nurse is available today but CSW spoke with Lawrence at nurses station about patient. Patient was at [facility] under rehabilitation, will need a new insurance auth prior to returning. No therapy orders placed for patient, CSW asked MD to place. Also, noting order for safety sitter placed this morning; patient will need to have sitter DC for 24 hours in order to discharge to SNF. CSW alerted MD, plan to return to SNF tomorrow if recommended by PT and OT. [place] aware, can admit back once approved by insurance. CSW to follow.             Expected Discharge Plan: Skilled Nursing Facility Barriers to Discharge: Continued Medical Work up, [place] will not accept until restraint criteria met   Patient Goals and CMS Choice Patient states their goals for this hospitalization and ongoing recovery are:: back to [place].gov Compare Post Acute Care list provided to:: Patient Choice offered to / list presented to : Patient  Expected Discharge Plan and Services Expected Discharge Plan: [place] Choice: [place] Living arrangements for the past 2 months: [place] Expected Discharge Date: 11/15/21                                    Prior Living Arrangements/Services Living arrangements for the past 2 months: [place], [place] Lives with:: Self Patient language and need for interpreter reviewed:: No Do you feel safe going back to the place where you live?: Yes      Need for Family  Participation in Patient Care: Yes (Comment) Care giver support system in place?: Yes (comment)   Criminal Activity/Legal Involvement Pertinent to Current Situation/Hospitalization: No - Comment as needed  Activities of Daily Living Home Assistive Devices/Equipment: Cane (specify quad or straight) ADL Screening (condition at time of admission) Patient's cognitive ability adequate to safely complete daily activities?: Yes Is the patient deaf or have difficulty hearing?: No Does the patient have difficulty seeing, even when wearing glasses/contacts?: Yes Does the patient have difficulty concentrating, remembering, or making decisions?: No Patient able to express need for assistance with ADLs?: Yes Does the patient have difficulty dressing or bathing?: Yes Independently performs ADLs?: No Does the patient have difficulty walking or climbing stairs?: Yes Weakness of Legs: Right Weakness of Arms/Hands: None  Permission Sought/Granted Permission sought to share information with : Facility Sport and exercise psychologist, Family Supports Permission granted to share information with : Yes, Verbal Permission Granted  Share Information with NAME: Jeralene Huff  Permission granted to share info w AGENCY: [place] granted to share info w Relationship: Niece     Emotional Assessment       Orientation: : Oriented to Self, Oriented to Place, Oriented to  Time Alcohol / Substance Use: Not Applicable Psych Involvement: No (comment)  Admission diagnosis:  Other postoperative complication of skin [E93.81] Wound dehiscence [T81.30XA] Patient Active Problem List   Diagnosis Date Noted   Wound dehiscence  11/14/2021   Hyperlipidemia    Hepatitis C carrier ([place])    Diabetic neuropathy (HCC)    Retinopathy, diabetic, bilateral ([place])    Acute delirium    Peripheral arterial disease ([place])    Amputation of right great toe [facility])    Physical deconditioning    Gangrene of foot ([place]) 10/23/2021    Patellar sleeve fracture of right knee, closed, initial encounter 10/21/2021   Closed patellar sleeve fracture of right knee 10/21/2021   Malignant hypertension 09/30/2021   Flash pulmonary edema ([place]) 09/30/2021   Non compliance with medical treatment 09/30/2021   Foot pain, right 09/22/2021   Elevated troponin 09/16/2021   Anemia due to chronic kidney disease 09/16/2021   Impaired functional mobility and activity tolerance 01/04/2019   Pneumonia of both lungs due to infectious organism 10/05/2017   Fall    Anemia in chronic kidney disease (CKD) 07/12/2017   COPD (chronic obstructive pulmonary disease) ([place]) 06/24/2017   ESRD (end stage renal disease) ([place]) -  Dialyzes at [facility].  On Monday Wednesday Friday. 06/17/2017   Foot drop 05/26/2017   Carpal tunnel syndrome of left wrist 05/20/2017   Ulnar neuropathy of left upper extremity 05/20/2017   Poor compliance with medication 05/17/2017   Peripheral neuropathy 05/10/2017   Controlled type 2 diabetes mellitus with chronic kidney disease on chronic dialysis ([place]) 05/10/2017   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   Vitamin D deficiency 04/07/2017   Absolute glaucoma of right eye 12/27/2016   Glaucoma (increased eye pressure) 12/27/2016   PCO (posterior capsule opacification), left 12/27/2016   Gait abnormality 03/07/2014   Abnormality of gait 03/07/2014   Hepatitis C virus infection without hepatic coma 03/04/2014   History of hepatitis C 03/04/2014   Erosive gastritis 02/14/2014   Pseudophakia of left eye 10/30/2012   Asthma 10/13/2012   Reflux 10/13/2012   Lens replaced by other means 10/12/2012   Primary open angle glaucoma 10/12/2012   BACK PAIN, LUMBAR 08/18/2010   IDDM 08/11/2010   HYPERCHOLESTEROLEMIA 08/11/2010   HYPOKALEMIA 08/11/2010   DEPRESSION 08/11/2010   GLAUCOMA 08/11/2010   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 [place] [address] Phone: [phone] Fax: [place] [address] [place] [address] Phone: [phone] Fax: [phone]     Social Determinants of Health (SDOH) Interventions    Readmission Risk Interventions Readmission Risk Prevention Plan 10/09/2021  Transportation Screening Complete  HRI or Home Care Consult Complete  SW Recovery Care/Counseling Consult Complete  Palliative Care Screening Not Applicable  Skilled Nursing Facility Complete  Some recent data might be hidden

## 2021-11-15 NOTE — Evaluation (Signed)
Physical Therapy Evaluation Patient Details Name: Tammy Moses MRN: [id number] DOB: 04/12/1966 Today's Date: 11/15/2021  History of Present Illness  Pt is a 56 y.o. who presented with bleeding from right groin surgical site and admitted for right groin wound dehiscence.   Patient with recent great toe amputation and femoral endarterectomy 10/30/21.   PMH pertinent for ESRD on HD MWF, type 2 diabetes hypertension, normocytic anemia secondary to CKD, COPD, blindness OD, tobacco use disorder.  Per recent PT note, also with recent patella fx on 10/21/21.  Clinical Impression  Patient presents with dependencies in transfers, gait and mobility.  Patient limited by increased pain, decreased balance, decreased strength and potentially limited weight bearing RLE.  Patient will benefit from continued PT in hospital and SNF to progress mobility in hopes for eventual return to home.         Recommendations for follow up therapy are one component of a multi-disciplinary discharge planning process, led by the attending physician.  Recommendations may be updated based on patient status, additional functional criteria and insurance authorization.  Follow Up Recommendations Skilled nursing-short term rehab (<3 hours/day)    Assistance Recommended at Discharge    Patient can return home with the following  A lot of help with walking and/or transfers;Help with stairs or ramp for entrance    Equipment Recommendations Rolling walker (2 wheels);Wheelchair (measurements PT);BSC/3in1  Recommendations for Other Services       Functional Status Assessment Patient has had a recent decline in their functional status and demonstrates the ability to make significant improvements in function in a reasonable and predictable amount of time.     Precautions / Restrictions Precautions Precautions: Fall Precaution Comments: blind, R foot drop Restrictions Other Position/Activity Restrictions: need to clarify WB status  and knee immobilizer.  Was NWB and knee immobilizer at all times as of 11/05/21.  Messaged MD.      Mobility  Bed Mobility Overal bed mobility: Modified Independent Bed Mobility: Supine to Sit, Sit to Supine     Supine to sit: Modified independent (Device/Increase time) Sit to supine: Modified independent (Device/Increase time)        Transfers Overall transfer level: Needs assistance Equipment used: Rolling walker (2 wheels) Transfers: Sit to/from Stand Sit to Stand: Min guard           General transfer comment: declined due to pain    Ambulation/Gait             Pre-gait activities: able to stand with RW, holding RLE off floor General Gait Details: declined due to pain  Stairs            Wheelchair Mobility    Modified Rankin (Stroke Patients Only)       Balance Overall balance assessment: Needs assistance Sitting-balance support: No upper extremity supported, Feet supported Sitting balance-Leahy Scale: Good     Standing balance support: Bilateral upper extremity supported, Reliant on assistive device for balance Standing balance-Leahy Scale: Poor Standing balance comment: reliant on UE support to maintain balance and WB precautions                             Pertinent Vitals/Pain Pain Assessment Pain Assessment: Faces Faces Pain Scale: Hurts little more Pain Location: R LE groin Pain Descriptors / Indicators: Constant, Discomfort, Guarding Pain Intervention(s): Limited activity within patient's tolerance, Monitored during session    Home Living Family/patient expects to be discharged to:: Skilled nursing facility  Prior Function Prior Level of Function : Needs assist       Physical Assist : Mobility (physical) Mobility (physical): Bed mobility;Transfers;Gait;Stairs   Mobility Comments: has been unable to walk since knee injury per patient; recieving PT at SNF per patient       Hand  Dominance        Extremity/Trunk Assessment        Lower Extremity Assessment Lower Extremity Assessment: Generalized weakness RLE Deficits / Details: R foot drop at baseline.    Cervical / Trunk Assessment Cervical / Trunk Assessment: Normal  Communication      Cognition Arousal/Alertness: Awake/alert Behavior During Therapy: Flat affect Overall Cognitive Status: No family/caregiver present to determine baseline cognitive functioning                                          General Comments      Exercises     Assessment/Plan    PT Assessment Patient needs continued PT services  PT Problem List Decreased strength;Decreased activity tolerance;Decreased balance;Decreased mobility;Decreased knowledge of use of DME;Decreased knowledge of precautions;Decreased safety awareness       PT Treatment Interventions Therapeutic activities;Gait training;Therapeutic exercise;Patient/family education;Functional mobility training;Balance training;Cognitive remediation;DME instruction    PT Goals (Current goals can be found in the Care Plan section)  Acute Rehab PT Goals Patient Stated Goal: get this VAC pack off PT Goal Formulation: With patient Time For Goal Achievement: 11/22/21 Potential to Achieve Goals: Fair    Frequency Min 2X/week     Co-evaluation               AM-PAC PT "6 Clicks" Mobility  Outcome Measure Help needed turning from your back to your side while in a flat bed without using bedrails?: None Help needed moving from lying on your back to sitting on the side of a flat bed without using bedrails?: None Help needed moving to and from a bed to a chair (including a wheelchair)?: A Little Help needed standing up from a chair using your arms (e.g., wheelchair or bedside chair)?: A Little Help needed to walk in hospital room?: Total Help needed climbing 3-5 steps with a railing? : Total 6 Click Score: 16    End of Session   Activity  Tolerance: Patient limited by pain Patient left: in bed;with call bell/phone within reach   PT Visit Diagnosis: Unsteadiness on feet (R26.81);Muscle weakness (generalized) (M62.81);History of falling (Z91.81);Repeated falls (R29.6);Difficulty in walking, not elsewhere classified (R26.2)    Time: 0086-7619 PT Time Calculation (min) (ACUTE ONLY): 8 min   Charges:   PT Evaluation $PT Eval Moderate Complexity: 1 Mod          11/15/2021 Margie, PT Acute Rehabilitation Services Pager:  [phone] Office:  [phone]   Shanna Cisco 11/15/2021, 3:27 PM

## 2021-11-15 NOTE — Consult Note (Signed)
[place] Nurse Consult Note: Reason for Consult: Consult received for M/W/F NPWT dressing changes to right groin surgical wound by Dr. [name].  Will see Monday, 11/16/21. Wound type: Surgical  WOC nursing team will follow, and will remain available to this patient, the nursing and medical teams.  Supplies ordered to bedside today: 2 Medium VAC dressing kits and 2 skin barrier rings.    Thanks, Maudie Flakes, MSN, RN, [place], Arther Abbott  Pager# [phone]

## 2021-11-15 NOTE — NC FL2 (Signed)
[place] LEVEL OF CARE SCREENING TOOL     IDENTIFICATION  Patient Name: Tammy Moses Birthdate: 06-01-1966 Sex: female Admission Date (Current Location): 11/13/2021  [facility] and [place] Number:  Herbalist and Address:  [facility] [place], [place] [address] [place], [place] [address]      Provider Number: [id number]  Attending Physician Name and Address:  Lucious Groves, DO  Relative Name and Phone Number:       Current Level of Care: Hospital Recommended Level of Care: [place] Prior Approval Number:    Date Approved/Denied:   PASRR Number:    Discharge Plan: SNF    Current Diagnoses: Patient Active Problem List   Diagnosis Date Noted   Wound dehiscence 11/14/2021   Hyperlipidemia    Hepatitis C carrier ([place])    Diabetic neuropathy ([place])    Retinopathy, diabetic, bilateral ([place])    Acute delirium    Peripheral arterial disease ([place])    Amputation of right great toe [facility])    Physical deconditioning    Gangrene of foot ([place]) 10/23/2021   Patellar sleeve fracture of right knee, closed, initial encounter 10/21/2021   Closed patellar sleeve fracture of right knee 10/21/2021   Malignant hypertension 09/30/2021   Flash pulmonary edema ([place]) 09/30/2021   Non compliance with medical treatment 09/30/2021   Foot pain, right 09/22/2021   Elevated troponin 09/16/2021   Anemia due to chronic kidney disease 09/16/2021   Impaired functional mobility and activity tolerance 01/04/2019   Pneumonia of both lungs due to infectious organism 10/05/2017   Fall    Anemia in chronic kidney disease (CKD) 07/12/2017   COPD (chronic obstructive pulmonary disease) ([place]) 06/24/2017   ESRD (end stage renal disease) ([place]) -  Dialyzes at [facility].  On Monday Wednesday Friday. 06/17/2017   Foot drop 05/26/2017   Carpal tunnel syndrome of left wrist 05/20/2017   Ulnar neuropathy of left upper extremity 05/20/2017   Poor  compliance with medication 05/17/2017   Peripheral neuropathy 05/10/2017   Controlled type 2 diabetes mellitus with chronic kidney disease on chronic dialysis ([place]) 05/10/2017   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   Vitamin D deficiency 04/07/2017   Absolute glaucoma of right eye 12/27/2016   Glaucoma (increased eye pressure) 12/27/2016   PCO (posterior capsule opacification), left 12/27/2016   Gait abnormality 03/07/2014   Abnormality of gait 03/07/2014   Hepatitis C virus infection without hepatic coma 03/04/2014   History of hepatitis C 03/04/2014   Erosive gastritis 02/14/2014   Pseudophakia of left eye 10/30/2012   Asthma 10/13/2012   Reflux 10/13/2012   Lens replaced by other means 10/12/2012   Primary open angle glaucoma 10/12/2012   BACK PAIN, LUMBAR 08/18/2010   IDDM 08/11/2010   HYPERCHOLESTEROLEMIA 08/11/2010   HYPOKALEMIA 08/11/2010   DEPRESSION 08/11/2010   GLAUCOMA 08/11/2010   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   Hypertension 08/11/2010   GERD 08/11/2010   CONSTIPATION 08/06/2009    Orientation RESPIRATION BLADDER Height & Weight     Self, Time, Place  Normal Incontinent Weight: 114 lb 3.2 oz (51.8 kg) Height:  5' 0.98" (154.9 cm)  BEHAVIORAL SYMPTOMS/MOOD NEUROLOGICAL BOWEL NUTRITION STATUS      Continent Diet (low sodium heart healthy)  AMBULATORY STATUS COMMUNICATION OF NEEDS Skin   Extensive Assist Verbally Normal                       Personal Care Assistance Level of  Assistance  Bathing, Feeding, Dressing Bathing Assistance: Maximum assistance Feeding assistance: Independent Dressing Assistance: Maximum assistance     Functional Limitations Info  Sight Sight Info: Impaired        SPECIAL CARE FACTORS FREQUENCY  PT (By licensed PT), OT (By licensed OT)     PT Frequency: 5x/wk OT Frequency: 5x/wk            Contractures Contractures Info: Not present    Additional Factors Info  Code Status, Allergies, Insulin Sliding Scale Code  Status Info: Full Allergies Info: Acyclovir and Related   Insulin Sliding Scale Info: see DC summary       Current Medications (11/15/2021):  This is the current hospital active medication list Current Facility-Administered Medications  Medication Dose Route Frequency Provider Last Rate Last Admin   acetaminophen (TYLENOL) tablet 650 mg  650 mg Oral Q4H PRN Masters, Katie, DO   650 mg at 11/14/21 2121   albuterol (PROVENTIL) (2.5 MG/3ML) 0.083% nebulizer solution 2.5 mg  2.5 mg Inhalation Q4H PRN Angelia Mould, MD       amLODipine (NORVASC) tablet 10 mg  10 mg Oral Daily Masters, Katie, DO   10 mg at 11/15/21 0827   aspirin EC tablet 81 mg  81 mg Oral Daily Masters, Katie, DO   81 mg at 11/15/21 0826   atorvastatin (LIPITOR) tablet 80 mg  80 mg Oral Daily Masters, Katie, DO   80 mg at 11/15/21 0827   atropine 1 % ophthalmic solution 1 drop  1 drop Both Eyes BID Angelia Mould, MD   1 drop at 11/15/21 0827   buPROPion [facility] SR) 12 hr tablet 150 mg  150 mg Oral Daily Masters, Katie, DO   150 mg at 11/15/21 0827   calcium acetate (PHOSLO) capsule 2,001 mg  2,001 mg Oral TID WC Angelia Mould, MD   2,001 mg at 11/15/21 1136   dorzolamide-timolol (COSOPT) 22.3-6.8 MG/ML ophthalmic solution 1 drop  1 drop Both Eyes BID Masters, Katie, DO   1 drop at 11/15/21 1937   furosemide (LASIX) tablet 40 mg  40 mg Oral BID Masters, Katie, DO   40 mg at 11/15/21 0827   heparin injection 5,000 Units  5,000 Units Subcutaneous Q8H Masters, Katie, DO   5,000 Units at 11/15/21 0544   hydrALAZINE (APRESOLINE) tablet 100 mg  100 mg Oral TID Angelia Mould, MD   100 mg at 11/15/21 9024   labetalol (NORMODYNE) tablet 200 mg  200 mg Oral BID Masters, Katie, DO   200 mg at 11/15/21 0827   lamoTRIgine (LAMICTAL) tablet 25 mg  25 mg Oral Daily Angelia Mould, MD   25 mg at 11/15/21 0826   latanoprost (XALATAN) 0.005 % ophthalmic solution 1 drop  1 drop Both Eyes QHS Masters,  Katie, DO   1 drop at 11/14/21 2119   losartan (COZAAR) tablet 100 mg  100 mg Oral Daily Angelia Mould, MD   100 mg at 11/15/21 0973   multivitamin (RENA-VIT) tablet 1 tablet  1 tablet Oral QHS Masters, Katie, DO   1 tablet at 11/14/21 2119   oxyCODONE (Oxy IR/ROXICODONE) immediate release tablet 5 mg  5 mg Oral Q6H PRN Gaylan Gerold, DO       pregabalin (LYRICA) capsule 25 mg  25 mg Oral Daily Masters, Katie, DO   25 mg at 11/15/21 5329   senna-docusate (Senokot-S) tablet 1 tablet  1 tablet Oral Daily PRN Masters, Joellen [place], DO  Discharge Medications: Please see discharge summary for a list of discharge medications.  Relevant Imaging Results:  Relevant Lab Results:   Additional Information SS#: [id number]  Geralynn Ochs, LCSW

## 2021-11-15 NOTE — Progress Notes (Signed)
HD#0 SUBJECTIVE:  Patient Summary: Tammy Moses is a 56 y.o. 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   Overnight Events: Patient pulled wound vac off x2 last night.  Interim History: Patient assessed at bedside this AM.  She denies concerns at this time.  She does not recall messing with wound VAC.  OBJECTIVE:  Vital Signs: Vitals:   11/14/21 1352 11/14/21 1630 11/14/21 1947 11/15/21 0406  BP: (!) 186/53 (!) 176/64 (!) 156/60 (!) 170/63  Pulse: 83 87 84 79  Resp: 16 10 14 20   Temp: 99.5 F (37.5 C)  (!) 100.5 F (38.1 C) 98.2 F (36.8 C)  TempSrc: Oral  Oral Oral  SpO2: 100% 99% 99% 100%  Weight:      Height:       Supplemental O2: Room Air SpO2: 100 % O2 Flow Rate (L/min): 6 L/min  Filed Weights   11/14/21 0933  Weight: 51.8 kg     Intake/Output Summary (Last 24 hours) at 11/15/2021 0550 Last data filed at 11/14/2021 1108 Gross per 24 hour  Intake 350 ml  Output 5 ml  Net 345 ml   Net IO Since Admission: 345 mL [11/15/21 0550]  Physical Exam: Constitutional: well-appearing, sitting in bed, in no acute distress HENT: normocephalic atraumatic, mucous membranes moist Eyes: opacification OD, able to track with OS Neck: supple Cardiovascular: regular rate and rhythm, no m/r/g Pulmonary/Chest: normal work of breathing on room air, lungs clear to auscultation bilaterally Abdominal: soft, non-tender, non-distended MSK: normal bulk and tone, wound VAC in place to right groin, grade 2 amputation site nonhealing Neurological: alert & oriented x 3 Skin: warm and dry Psych: normal mood and affect   Patient Lines/Drains/Airways Status     Active Line/Drains/Airways     Name Placement date Placement time Site Days   Peripheral IV 11/14/21 20 G Anterior;Right  Forearm 11/14/21  0916  Forearm  1   Peripheral IV 11/14/21 20 G Right Forearm 11/14/21  0930  Forearm  1   Fistula / Graft Left Upper arm 07/12/17  1900  Upper arm  1587   Negative Pressure Wound Therapy Groin Anterior;Proximal;Right 11/14/21  1102  --  1   Incision (Closed) 10/30/21 Groin Right 10/30/21  1402  -- 16   Incision (Closed) 10/30/21 Foot Right 10/30/21  1426  -- 16   Incision (Closed) 11/14/21 Groin Right 11/14/21  1105  -- 1   Wound / Incision (Open or Dehisced) 07/12/17 Other (Comment) Arm Left 07/12/17  1900  Arm  1587            Pertinent Labs: CBC Latest Ref Rng & Units 11/15/2021 11/13/2021 11/06/2021  WBC 4.0 - 10.5 K/uL 16.5(H) 14.3(H) 15.9(H)  Hemoglobin 12.0 - 15.0 g/dL 9.0(L) 9.8(L) 8.9(L)  Hematocrit 36.0 - 46.0 % 29.6(L) 32.1(L) 29.5(L)  Platelets 150 - 400 K/uL 630(H) 576(H) 362    CMP Latest Ref Rng & Units 11/15/2021 11/13/2021 11/06/2021  Glucose 70 - 99 mg/dL 139(H) 207(H) 144(H)  BUN 6 - 20 mg/dL 33(H) 18 31(H)  Creatinine 0.44 - 1.00 mg/dL 6.56(H) 4.61(H) 8.29(H)  Sodium 135 - 145 mmol/L 140 139 136  Potassium 3.5 - 5.1 mmol/L 4.3 4.1 4.0  Chloride 98 - 111 mmol/L 100 99 96(L)  CO2 22 - 32 mmol/L 26 25 25  Calcium 8.9 - 10.3 mg/dL 10.0 10.0 10.0  Total Protein 6.5 - 8.1 g/dL - - -  Total Bilirubin 0.3 - 1.2 mg/dL - - -  Alkaline Phos 38 - 126 U/L - - -  AST 15 - 41 U/L - - -  ALT 0 - 44 U/L - - -    Recent Labs    11/14/21 1001 11/14/21 1126 11/14/21 2045  GLUCAP 112* 116* 189*     Pertinent Imaging: No results found.  ASSESSMENT/PLAN:  Assessment: Principal Problem:   Wound dehiscence   Tammy Moses is a 56 y.o. 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   Plan: #Right groin wound dehiscence with underlying  hematoma #Nonhealing right great toe amputation site #Severe PAD Patient with small amount of output from wound VAC overnight.  She removed this twice, but does not remember doing so.  Patient will need to be reevaluated by physical therapy prior to going back to rehab at [facility].  -PT eval -Continue wound [facility] Monday Wednesday Friday.   #ESRD HD MWF #Anemia of chronic disease #Severe protein calorie malnutrition Patient receives HD MWF, last HD Friday.  Potassium and BUN within normal limits.  Patient does not appear volume overloaded.  She will need to stay overnight prior to hopefully returning to SNF tomorrow. Contacted nephro to help arrange HD since patient will be staying until tomorrow. -Nephrology consulted for possible HD tomorrow prior to discharge  #Well-controlled Type 2 diabetes mellitus Last Hgba1c of 5.9. Patient does not take medications for diabetes. Glucose remained controlled during admission in 100s without insulin.   #Chronic HFpEF #HTN Last EF of 60 -65% 01/23. Home medications include Losartan 100 mg, amlodipine 10 mg, hydralazine 100 mg q 8 hrs, and Labetalol 200 mg.    #Depression Home medications include Lamictal and bupropion 150 mg BID.  Unclear why patient takes Lamictal.  Spoke with patient's niece and she stated that patient had been more confused since November.  At times since November she has had difficulty identifying family members and forgets what has recently happened.  Patient has been removing bandages from right toe amputation at facility.  She was noted to be doing this last night to wound VAC due to confusion. -Hold Lamictal  -Continue bupropion -Seroquel 12.5 mg qhs prn  #HLD Atorvastatin 80 mg  Best Practice: Diet: Renal diet IVF: none VTE: heparin injection 5,000 Units Start: 11/15/21 0600 Code: Full AB: none Therapy Recs: SNF Family Contact: updated niece DISPO: Anticipated discharge tomorrow to Skilled nursing facility pending  PT eval.  Signature: Megen Madewell M. Mahmood Boehringer, D.O.  Internal Medicine Resident, PGY-1 [facility]  Pager: [phone] 5:50 AM, 11/15/2021   Please contact the on call pager after 5 pm and on weekends at [phone].

## 2021-11-15 NOTE — Consult Note (Signed)
Renal Service Consult Note [facility]  Tammy Moses 11/15/2021 Sol Blazing, MD Requesting Physician: Dr. Heber   Reason for Consult: ESRD pt w/ groin wound dehiscence HPI: The patient is a 56 y.o. year-old w/ hx of Anemia, ESRD on HD, DM2, HL, HTN who recently was admitted for R fem endarterectomy w/ angioplasty of tibial artery and amputation of R toe on 10/30/21. She rolled over and awoke w/ the R groin wound from the procedure open and draining brown fluid. Came to ED , BP's were high, HR 86, WBC 14K, Hb 9.8. VVS called and pt was taken to OR yesterday morning and underwent R groin wound exploration w/ evacuation of lymphocele and VAC placement. PT is on HD MWF.  Asked to see for dialysis.   Pt seen in room, no c/o's today. No SOB, cough or CP, no n/v/d, no abd pain.  States she is now w/ [facility] in [place], maybe on [address]. Not sure. Has L arm AVF.   ROS - denies CP, no joint pain, no HA, no blurry vision, no rash, no diarrhea, no nausea/ vomiting, no dysuria, no difficulty voiding   Past Medical History  Past Medical History:  Diagnosis Date   Anemia    Anxiety    Blind right eye    Chronic kidney disease, stage V (HCC)    Constipation, chronic    Dental caries    Diabetes mellitus    Diabetic neuropathy ([place])    Diabetic retinopathy    GERD (gastroesophageal reflux disease)    Glaucoma    H. pylori infection    Hepatitis C carrier ([place])    High risk sexual behavior    Hyperlipidemia    Hypertension    Hypoglycemia 07/12/2017   Insomnia    Microalbuminuria    Nonspecific elevation of levels of transaminase or lactic acid dehydrogenase (LDH)    Tobacco dependence    Vitamin D deficiency    Past Surgical History  Past Surgical History:  Procedure Laterality Date   ABDOMINAL AORTOGRAM W/LOWER EXTREMITY N/A 10/27/2021   Procedure: ABDOMINAL AORTOGRAM W/LOWER EXTREMITY;  Surgeon: Serafina Mitchell, MD;  Location: [place] [facility];   Service: Cardiovascular;  Laterality: N/A;   ABDOMINAL HYSTERECTOMY  04/30/2009   PARTIAL   AMPUTATION Right 10/30/2021   Procedure: RIGHT GREAT TOE AMPUTATION;  Surgeon: Serafina Mitchell, MD;  Location: [facility] OR;  Service: Vascular;  Laterality: Right;   COLONOSCOPY     DIALYSIS FISTULA CREATION Left 06/2017   ENDARTERECTOMY FEMORAL Right 10/30/2021   Procedure: RIGHT FEMORAL ENDARTERECTOMY WITH ANGIOPLASTY OF TIBIAL ARTERY;  Surgeon: Serafina Mitchell, MD;  Location: [facility] OR;  Service: Vascular;  Laterality: Right;   ESOPHAGOGASTRODUODENOSCOPY     GROIN DEBRIDEMENT Right 11/14/2021   Procedure: Wound exploration and closure.;  Surgeon: Angelia Mould, MD;  Location: [facility] OR;  Service: Vascular;  Laterality: Right;   IR AV DIALY SHUNT INTRO NEEDLE/INTRACATH INITIAL W/PTA/IMG RIGHT Right 10/07/2021   IR FLUORO GUIDE CV LINE RIGHT  10/06/2021   IR US GUIDE VASC ACCESS RIGHT  10/06/2021   IR US GUIDE VASC ACCESS RIGHT  10/08/2021   Family History  Family History  Problem Relation Age of Onset   High blood pressure Mother    Diabetes Mother    Thyroid disease Mother    Diabetes Father    High blood pressure Father    Cerebral palsy Daughter    Other Son  still born   Social History  reports that she has been smoking cigarettes. She has been smoking an average of .25 packs per day. She has never used smokeless tobacco. She reports current alcohol use. She reports that she does not currently use drugs after having used the following drugs: Marijuana and Cocaine. Allergies  Allergies  Allergen Reactions   Acyclovir And Related Itching   Home medications Prior to Admission medications   Medication Sig Start Date End Date Taking? Authorizing Provider  acetaminophen (TYLENOL) 325 MG tablet Take 2 tablets (650 mg total) by mouth every 4 (four) hours as needed for headache or mild pain. 11/06/21  Yes Samella Parr, NP  albuterol (VENTOLIN HFA) 108 (90 Base) MCG/ACT inhaler Inhale 2  puffs into the lungs every 4 (four) hours as needed for shortness of breath. For shortness of breath Patient taking differently: Inhale 2 puffs into the lungs every 4 (four) hours as needed for shortness of breath or wheezing. 12/11/19  Yes [name], FNP  amLODipine (NORVASC) 10 MG tablet Take 1 tablet (10 mg total) by mouth daily. 11/06/21  Yes Samella Parr, NP  aspirin EC 81 MG EC tablet Take 1 tablet (81 mg total) by mouth daily. Swallow whole. 11/06/21  Yes Samella Parr, NP  atorvastatin (LIPITOR) 80 MG tablet Take 1 tablet (80 mg total) by mouth daily. 11/06/21  Yes Samella Parr, NP  atropine 1 % ophthalmic solution Place 1 drop into both eyes 2 (two) times daily.   Yes [provider]  buPROPion (WELLBUTRIN SR) 150 MG 12 hr tablet Take 1 tablet (150 mg total) by mouth daily. 11/06/21  Yes Samella Parr, NP  calcium acetate (PHOSLO) 667 MG capsule Take 3 capsules (2,001 mg total) by mouth 3 (three) times daily with meals. 11/06/21  Yes Samella Parr, NP  dorzolamide-timolol (COSOPT) 22.3-6.8 MG/ML ophthalmic solution Place 1 drop into both eyes 2 (two) times daily. 12/08/20  Yes [provider]  hydrALAZINE (APRESOLINE) 100 MG tablet Take 1 tablet (100 mg total) by mouth every 8 (eight) hours. Patient taking differently: Take 100 mg by mouth 3 (three) times daily. 10/09/21  Yes Shelly Coss, MD  HYDROmorphone (DILAUDID) 2 MG tablet Take 0.5 tablets (1 mg total) by mouth every 4 (four) hours as needed for severe pain. 11/06/21  Yes Samella Parr, NP  insulin lispro (HUMALOG) 100 UNIT/ML injection Inject into the skin 3 (three) times daily before meals. Inject as per sliding scale 150-200=2 units 201-250=4 units 251-300=6 units 301-350=8 units 351-400=10 units   Yes [provider]  labetalol (NORMODYNE) 200 MG tablet Take 1 tablet (200 mg total) by mouth 2 (two) times daily. 11/06/21  Yes Samella Parr, NP  lamoTRIgine (LAMICTAL) 25 MG  tablet Take 25 mg by mouth daily. 01/16/21  Yes [provider]  latanoprost (XALATAN) 0.005 % ophthalmic solution Place 1 drop into both eyes at bedtime. 12/16/20  Yes [provider]  losartan (COZAAR) 100 MG tablet Take 1 tablet (100 mg total) by mouth daily. 11/06/21  Yes Samella Parr, NP  metoCLOPramide (REGLAN) 10 MG tablet Take 10 mg by mouth in the morning and at bedtime. 09/22/21  Yes [provider]  Multiple Vitamins-Minerals (MULTIVITAMIN WITH MINERALS) tablet Take 1 tablet by mouth daily.   Yes [provider]  multivitamin (RENA-VIT) TABS tablet Take 1 tablet by mouth at bedtime. Patient taking differently: Take 1 tablet by mouth daily. 11/06/21  Yes Lissa Merlin,  Leisa Lenz, NP  pregabalin (LYRICA) 25 MG capsule Take 1 capsule (25 mg total) by mouth at bedtime. Patient taking differently: Take 25 mg by mouth daily. 11/06/21  Yes Vann, Jessica U, DO  senna-docusate (SENOKOT-S) 8.6-50 MG tablet Take 1 tablet by mouth at bedtime as needed for mild constipation. Patient taking differently: Take 1 tablet by mouth daily as needed for mild constipation. 11/06/21  Yes Samella Parr, NP  furosemide (LASIX) 40 MG tablet Take 40 mg by mouth 2 (two) times daily. Patient not taking: Reported on 11/14/2021 11/10/21   [provider]     Vitals:   11/14/21 1947 11/15/21 0406 11/15/21 0748 11/15/21 1228  BP: (!) 156/60 (!) 170/63 (!) 180/80 (!) 157/64  Pulse: 84 79 78 80  Resp: 14 20 17 17   Temp: (!) 100.5 F (38.1 C) 98.2 F (36.8 C) 98.2 F (36.8 C) 98.5 F (36.9 C)  TempSrc: Oral Oral Axillary Axillary  SpO2: 99% 100% 99% 100%  Weight:      Height:       Exam Gen alert, no distress No rash, cyanosis or gangrene Sclera anicteric, throat clear  No jvd or bruits Chest clear bilat to bases, no rales/ wheezing RRR no MRG Abd soft ntnd no mass or ascites +bs MS no joint effusions or deformity Ext no LE or UE edema, no wounds or ulcers Neuro is  alert, Ox 3 , nf  LUA AVF+bruit   Home meds include - norvasc, asa, lipitor, wellbutrin sr, phoslo 3 ac tid, hydralazine 100 tid, hydromorphone prn , insulin lispro ssi, labetalol 200 bid, lamictal, losartan 100, rena-vit, lyrica 25 qd, lasix 40 bid, prns/ vits/ supps     OP HD: MWF patient states at [facility]  - get records in am   Assessment/ Plan: R groin wound dehiscence - sp lymphocele evacuation w/ wound VAC placement. Per VVS.  ESRD - on HD MWF.  Last HD Friday. Plan HD tomorrow.  BP/ volume - BP's high, no gross vol ^ on exam.  UF goal 2-3 L w/ HD Monday Anemia ckd - try to get records tomorrow about esa, Fe, etc MBD ckd - cont binder, Ca on the high side at 10.0.  Consider changing binder. Check phos.  Depression - on bupropion, lamictal and lyrica Social - pt [facility] at [facility]  MD 11/15/2021, 2:53 PM  Recent Labs  Lab 11/13/21 2118 11/15/21 0108  WBC 14.3* 16.5*  HGB 9.8* 9.0*   Recent Labs  Lab 11/13/21 2118 11/15/21 0108  K 4.1 4.3  BUN 18 33*  CREATININE 4.61* 6.56*  CALCIUM 10.0 10.0

## 2021-11-15 NOTE — Discharge Summary (Signed)
Name: Tammy Moses MRN: [id number] DOB: 28-Apr-1966 56 y.o. PCP: [name], FNP  Date of Admission: 11/13/2021  8:42 PM Date of Discharge: 11/15/21 Attending Physician: Lucious Groves, DO  Discharge Diagnosis: 1. Right groin wound dehiscence with underlying hematoma 2. Nonhealing right great toe amputation site 3. Severe PAD  Chronic: ESRD HD MWF Anemia of chronic disease Severe protein calorie malnutrition Well-controlled Type 2 diabetes mellitus Chronic HFpEF Hypertension Depression Hyperlipidemia Discharge Medications: Allergies as of 11/15/2021       Reactions   Acyclovir And Related Itching        Medication List     TAKE these medications    acetaminophen 325 MG tablet Commonly known as: TYLENOL Take 2 tablets (650 mg total) by mouth every 4 (four) hours as needed for headache or mild pain.   albuterol 108 (90 Base) MCG/ACT inhaler Commonly known as: VENTOLIN HFA Inhale 2 puffs into the lungs every 4 (four) hours as needed for shortness of breath. For shortness of breath What changed:  reasons to take this additional instructions   amLODipine 10 MG tablet Commonly known as: NORVASC Take 1 tablet (10 mg total) by mouth daily.   aspirin 81 MG EC tablet Take 1 tablet (81 mg total) by mouth daily. Swallow whole.   atorvastatin 80 MG tablet Commonly known as: LIPITOR Take 1 tablet (80 mg total) by mouth daily.   atropine 1 % ophthalmic solution Place 1 drop into both eyes 2 (two) times daily.   buPROPion 150 MG 12 hr tablet Commonly known as: WELLBUTRIN SR Take 1 tablet (150 mg total) by mouth daily.   calcium acetate 667 MG capsule Commonly known as: PHOSLO Take 3 capsules (2,001 mg total) by mouth 3 (three) times daily with meals.   dorzolamide-timolol 22.3-6.8 MG/ML ophthalmic solution Commonly known as: COSOPT Place 1 drop into both eyes 2 (two) times daily.   furosemide 40 MG tablet Commonly known as: LASIX Take 40 mg by  mouth 2 (two) times daily.   HumaLOG 100 UNIT/ML injection Generic drug: insulin lispro Inject into the skin 3 (three) times daily before meals. Inject as per sliding scale 150-200=2 units 201-250=4 units 251-300=6 units 301-350=8 units 351-400=10 units   hydrALAZINE 100 MG tablet Commonly known as: APRESOLINE Take 1 tablet (100 mg total) by mouth every 8 (eight) hours. What changed: when to take this   HYDROmorphone 2 MG tablet Commonly known as: DILAUDID Take 0.5 tablets (1 mg total) by mouth every 4 (four) hours as needed for severe pain.   labetalol 200 MG tablet Commonly known as: NORMODYNE Take 1 tablet (200 mg total) by mouth 2 (two) times daily.   lamoTRIgine 25 MG tablet Commonly known as: LAMICTAL Take 25 mg by mouth daily.   latanoprost 0.005 % ophthalmic solution Commonly known as: XALATAN Place 1 drop into both eyes at bedtime.   losartan 100 MG tablet Commonly known as: COZAAR Take 1 tablet (100 mg total) by mouth daily.   metoCLOPramide 10 MG tablet Commonly known as: REGLAN Take 10 mg by mouth in the morning and at bedtime.   multivitamin Tabs tablet Take 1 tablet by mouth at bedtime. What changed: when to take this   multivitamin with minerals tablet Take 1 tablet by mouth daily.   pregabalin 25 MG capsule Commonly known as: LYRICA Take 1 capsule (25 mg total) by mouth at bedtime. What changed: when to take this   senna-docusate 8.6-50 MG tablet Commonly known as: Senokot-S Take  1 tablet by mouth at bedtime as needed for mild constipation. What changed: when to take this               Discharge Care Instructions  (From admission, onward)           Start     Ordered   11/15/21 0000  Discharge wound care:       Comments: Keep wound VAC in place until follow-up with vascular surgery.  Change Monday Wednesday Friday.   11/15/21 1131            Disposition and follow-up:   Ms.Blanca R Dawn was discharged from [facility] in Stable condition.  At the hospital follow up visit please address:  #Right groin wound dehiscence with underlying hematoma #Nonhealing right great toe amputation site #Severe PAD Patient presented due to wound dehiscence following right iliofemoral endarterectomy(01/06).  Patient was taken to the OR by Dr. Doren Custard and found to have a large lymphocele that was evacuated and wound VAC was placed to prevent recurrence of lymphocele.  Right first toe amputation site noted to not be well healing, vascular will follow.  Patient is at risk for more proximal amputation.  Please continue use of wound VAC with changes on Monday Wednesday Friday.  Patient will follow-up with vascular surgery.  Labs / imaging needed at time of follow-up: CBC, BMP  Pending labs/ test needing follow-up: None  Follow-up Appointments:  Vascular surgery will arrange follow-up.  Hospital Course by problem list: #Right groin wound dehiscence with underlying hematoma #Nonhealing right great toe amputation site #Severe PAD Patient presented due to right groin wound dehiscence with underlying lymphocele.  She stated that she woke up with the wound open and she thinks she rolled on it.  On January 6 patient underwent right iliofemoral endarterectomy with vein patch angioplasty.  Angioplasty of right superficial femoral artery and popliteal artery was unsuccessful. She had a right great toe amputation at that time as well. At that time vascular was concerned for high risk of nonhealing that could lead to a below-knee versus above knee amputation.  Patient was taken to the OR by Dr. Doren Custard.  Patient was found to have a large lymphocele that was evacuated.  Wound VAC was placed on remaining open wound with plans to continue wound VAC (change on MWF).  Patient will follow-up with vascular surgery outpatient.    #ESRD HD MWF #Anemia of chronic disease #Severe protein calorie malnutrition Patient receives HD MWF, last  HD Friday.  Patient will resume regular outpatient HD tomorrow.   #Well-controlled Type 2 diabetes mellitus Last Hgba1c of 5.9. Patient does not take medications for diabetes. Glucose remained controlled during admission in 100s without insulin.  #Chronic HFpEF #HTN Last EF of 60 -65% 01/23. Home medications include Losartan 100 mg, amlodipine 10 mg, hydralazine 100 mg q 8 hrs, and Labetalol 200 mg.    #Depression   #HLD Atorvastatin 80 mg   Subjective: Patient assessed at bedside this AM.  She denies pain in her right groin and does not remember pulling with wound VAC overnight.  She does not have any concerns at this time.  Discharge Exam:   BP (!) 180/80 (BP Location: Right Arm)    Pulse 78    Temp 98.2 F (36.8 C) (Axillary)    Resp 17    Ht 5' 0.98" (1.549 m)    Wt 51.8 kg    SpO2 99%    BMI 21.59 kg/m  Discharge  exam:  Constitutional: well-appearing, sitting in bed, in no acute distress HENT: normocephalic atraumatic, mucous membranes moist Eyes: opacification OD, able to track with OS Neck: supple Cardiovascular: regular rate and rhythm, no m/r/g Pulmonary/Chest: normal work of breathing on room air, lungs clear to auscultation bilaterally Abdominal: soft, non-tender, non-distended MSK: normal bulk and tone, wound VAC in place to right groin, grade 2 amputation site nonhealing Neurological: alert & oriented x 3 Skin: warm and dry Psych: normal mood and affect   Pertinent Labs, Studies, and Procedures:  BMP Latest Ref Rng & Units 11/15/2021 11/13/2021 11/06/2021  Glucose 70 - 99 mg/dL 139(H) 207(H) 144(H)  BUN 6 - 20 mg/dL 33(H) 18 31(H)  Creatinine 0.44 - 1.00 mg/dL 6.56(H) 4.61(H) 8.29(H)  BUN/Creat Ratio 9 - 23 - - -  Sodium 135 - 145 mmol/L 140 139 136  Potassium 3.5 - 5.1 mmol/L 4.3 4.1 4.0  Chloride 98 - 111 mmol/L 100 99 96(L)  CO2 22 - 32 mmol/L 26 25 25   Calcium 8.9 - 10.3 mg/dL 10.0 10.0 10.0   CBC Latest Ref Rng & Units 11/15/2021 11/13/2021 11/06/2021  WBC  4.0 - 10.5 K/uL 16.5(H) 14.3(H) 15.9(H)  Hemoglobin 12.0 - 15.0 g/dL 9.0(L) 9.8(L) 8.9(L)  Hematocrit 36.0 - 46.0 % 29.6(L) 32.1(L) 29.5(L)  Platelets 150 - 400 K/uL 630(H) 576(H) 362    Discharge Instructions: Discharge Instructions     Diet - low sodium heart healthy   Complete by: As directed    Discharge instructions   Complete by: As directed    Ms. Ditmer,  You were recently admitted to [facility] for open wound in her right groin.  This was closed by your surgeons but you will need to keep wound VAC in place and they will follow-up with you soon.  Continue taking your home medications as you were before.  You should seek further medical care if develops fevers, chills, or bleeding from surgical site.  We recommend that you see your primary care doctor in about a week to make sure that you continue to improve. We are so glad that you are feeling better.  Sincerely, Christiana Fuchs, DO   Discharge wound care:   Complete by: As directed    Keep wound VAC in place until follow-up with vascular surgery.  Change Monday Wednesday Friday.   Increase activity slowly   Complete by: As directed        Signed: Christiana Fuchs, DO 11/15/2021, 11:31 AM   Pager: [phone]

## 2021-11-15 NOTE — Progress Notes (Signed)
Patient keep removing the heart monitor leads. Monitor put on standby for now. We'll continue to monitor closely.

## 2021-11-15 NOTE — Progress Notes (Signed)
Patient pulled the wound vac dressing a second time, the wound is exposed MD notified. Md advised to have the [facility] supplies at the bed side for replacement.  Wound vac supplies already ordered and at the bedside.  We'll continue to monitor.

## 2021-11-15 NOTE — Care Management Obs Status (Signed)
[place] NOTIFICATION   Patient Details  Name: Tammy Moses MRN: [id number] Date of Birth: [birth year]   Medicare Observation Status Notification Given:  Yes    Geralynn Ochs, LCSW 11/15/2021, 11:58 AM

## 2021-11-15 NOTE — Progress Notes (Addendum)
Progress Note    11/15/2021 7:31 AM 1 Day Post-Op  Subjective:  no complaints. Patient pulled wound VAC off 2x over night. Patient verbalizes that she understands to leave wound VAC in place   Vitals:   11/14/21 1947 11/15/21 0406  BP: (!) 156/60 (!) 170/63  Pulse: 84 79  Resp: 14 20  Temp: (!) 100.5 F (38.1 C) 98.2 F (36.8 C)  SpO2: 99% 100%   Physical Exam: Cardiac:  regular Lungs:  non labored Incisions:  right groin incision well appearing. Wound VAC replaced Extremities: No palpable distal pulses.  No doppler distal pulses. The great toe amputation site on the right has ischemic edges and does not appear to be healing adequately. 2nd toe with ischemic changes as well Abdomen:  soft, non distended Neurologic: alert  CBC    Component Value Date/Time   WBC 16.5 (H) 11/15/2021 0108   RBC 3.25 (L) 11/15/2021 0108   HGB 9.0 (L) 11/15/2021 0108   HGB 12.4 12/13/2017 1416   HCT 29.6 (L) 11/15/2021 0108   HCT 37.8 12/13/2017 1416   PLT 630 (H) 11/15/2021 0108   PLT 254 12/13/2017 1416   MCV 91.1 11/15/2021 0108   MCV 83 12/13/2017 1416   MCH 27.7 11/15/2021 0108   MCHC 30.4 11/15/2021 0108   RDW 17.1 (H) 11/15/2021 0108   RDW 15.8 (H) 12/13/2017 1416   LYMPHSABS 1.5 11/13/2021 2118   LYMPHSABS 4.1 (H) 12/13/2017 1416   MONOABS 1.2 (H) 11/13/2021 2118   EOSABS 0.3 11/13/2021 2118   EOSABS 0.3 12/13/2017 1416   BASOSABS 0.1 11/13/2021 2118   BASOSABS 0.1 12/13/2017 1416    BMET    Component Value Date/Time   NA 140 11/15/2021 0108   NA 146 (H) 05/10/2017 0950   K 4.3 11/15/2021 0108   CL 100 11/15/2021 0108   CO2 26 11/15/2021 0108   GLUCOSE 139 (H) 11/15/2021 0108   BUN 33 (H) 11/15/2021 0108   BUN 38 (H) 05/10/2017 0950   CREATININE 6.56 (H) 11/15/2021 0108   CREATININE 3.40 (H) 03/11/2017 1505   CALCIUM 10.0 11/15/2021 0108   GFRNONAA 7 (L) 11/15/2021 0108   GFRNONAA 15 (L) 03/11/2017 1505   GFRAA 5 (L) 05/05/2020 1314   GFRAA 17 (L) 03/11/2017  1505    INR    Component Value Date/Time   INR 1.2 10/21/2021 1425     Intake/Output Summary (Last 24 hours) at 11/15/2021 0731 Last data filed at 11/15/2021 0700 Gross per 24 hour  Intake 350 ml  Output 30 ml  Net 320 ml     Assessment/Plan:  56 y.o. female is s/p  Exploration right groin wound; Evacuation of lymphocele; Placement of VAC 1 Day Post-Op   Some confusion overnight. Pulled wound VAC off several times Right groin wound well appearing Right 1st toe amputation site not healing adequately. Will continue to allow to demarcate. At risk for more proximal amputation Wound VAC reapplied to right groin Will order sitter for her Will place order for MWF vac changes by WOC RN  DVT prophylaxis:  sq hepar in  [place] [phone] 11/15/2021 7:31 AM  I have interviewed the patient and examined the patient. I agree with the findings by the PA.  Only 25 cc recorded from [facility].  However she had pulled it off last night.  Follow output closely.  I tried to encourage her to not pick at the [facility] dressing on her right groin.  Gae Gallop,  MD

## 2021-11-15 NOTE — Progress Notes (Signed)
Patient keep pulling at her wound vac dressing which created a leak, WV dressing reinforced, we'll continue to monitor.

## 2021-11-15 NOTE — Discharge Summary (Incomplete Revision)
Name: Tammy Moses MRN: [id number] DOB: 10-Feb-1966 56 y.o. PCP: [name], FNP  Date of Admission: 11/13/2021  8:42 PM Date of Discharge: 11/17/21 Attending Physician: Charise Killian, MD  Discharge Diagnosis: 1. Right groin wound dehiscence with underlying hematoma 2. Nonhealing right great toe amputation site 3. Severe PAD  Chronic: ESRD HD MWF Anemia of chronic disease Severe protein calorie malnutrition Well-controlled Type 2 diabetes mellitus Chronic HFpEF Hypertension Depression Hyperlipidemia Discharge Medications: Allergies as of 11/17/2021       Reactions   Acyclovir And Related Itching     Med Rec must be completed prior to using this [facility]***        Discharge Care Instructions  (From admission, onward)           Start     Ordered   11/15/21 0000  Discharge wound care:       Comments: Keep wound VAC in place until follow-up with vascular surgery.  Change Monday Wednesday Friday.   11/15/21 1131            Disposition and follow-up:   Tammy Moses was discharged from [facility] in Stable condition.  At the hospital follow up visit please address:  #Right groin wound dehiscence with underlying hematoma #Nonhealing right great toe amputation site #Severe PAD Patient presented due to wound dehiscence following right iliofemoral endarterectomy(01/06).  Patient was taken to the OR by Dr. Doren Custard and found to have a large lymphocele that was evacuated and wound VAC was placed to prevent recurrence of lymphocele.  Right first toe amputation site noted to not be well healing, vascular will follow.  Patient is at risk for more proximal amputation.  Please continue use of wound VAC with changes on Monday Wednesday Friday.  Patient will follow-up with vascular surgery.  Labs / imaging needed at time of follow-up: CBC, BMP  Pending labs/ test needing follow-up: None  Follow-up Appointments:  Vascular surgery will arrange  follow-up.  Hospital Course by problem list: #Right groin wound dehiscence with underlying hematoma #Nonhealing right great toe amputation site #Severe PAD Patient presented due to right groin wound dehiscence with underlying lymphocele.  She stated that she woke up with the wound open and she thinks she rolled on it.  On January 6 patient underwent right iliofemoral endarterectomy with vein patch angioplasty.  Angioplasty of right superficial femoral artery and popliteal artery was unsuccessful. She had a right great toe amputation at that time as well. At that time vascular was concerned for high risk of nonhealing that could lead to a below-knee versus above knee amputation.  Patient was taken to the OR by Dr. Doren Custard.  Patient was found to have a large lymphocele that was evacuated.  Wound VAC was placed on remaining open wound with plans to continue wound VAC (change on MWF).  Patient will follow-up with vascular surgery outpatient.    #ESRD HD MWF #Anemia of chronic disease #Severe protein calorie malnutrition Patient receives HD MWF, last HD Friday.  Patient will resume regular outpatient HD tomorrow.   #Well-controlled Type 2 diabetes mellitus Last Hgba1c of 5.9. Patient does not take medications for diabetes. Glucose remained controlled during admission in 100s without insulin.  #Chronic HFpEF #HTN Last EF of 60 -65% 01/23. Home medications include Losartan 100 mg, amlodipine 10 mg, hydralazine 100 mg q 8 hrs, and Labetalol 200 mg.    #Depression Home medications include Lamictal and bupropion 150 mg twice daily. Unclear why patient takes lamictal.  At times since November she has had difficulty identifying family members and forgets what has recently happened.  Patient has been removing bandages from right toe amputation at facility.    #HLD Atorvastatin 80 mg   Subjective: Patient assessed at bedside this AM.  Endorses some pain in her groin but is otherwise doing well at this  time. She does not have any concerns at this time.  Discharge Exam:   BP (!) 154/46 (BP Location: Right Arm)    Pulse 79    Temp 99 F (37.2 C) (Oral)    Resp 20    Ht 5' 0.98" (1.549 m)    Wt 48.5 kg    SpO2 97%    BMI 20.21 kg/m  Discharge exam:  Constitutional: well-appearing, sitting in bed, in no acute distress HENT: normocephalic atraumatic, mucous membranes moist Eyes: opacification OD, able to track with OS Neck: supple Cardiovascular: regular rate and rhythm, no m/r/g Pulmonary/Chest: normal work of breathing on room air, lungs clear to auscultation bilaterally Abdominal: soft, non-tender, non-distended MSK: normal bulk and tone, wound VAC in place to right groin, great toe amputation site nonhealing Neurological: alert & oriented x 3 Skin: warm and dry Psych: normal mood and affect   Pertinent Labs, Studies, and Procedures:  BMP Latest Ref Rng & Units 11/17/2021 11/16/2021 11/15/2021  Glucose 70 - 99 mg/dL 92 87 139(H)  BUN 6 - 20 mg/dL 24(H) 43(H) 33(H)  Creatinine 0.44 - 1.00 mg/dL 5.50(H) 8.71(H) 6.56(H)  BUN/Creat Ratio 9 - 23 - - -  Sodium 135 - 145 mmol/L 136 137 140  Potassium 3.5 - 5.1 mmol/L 4.1 4.2 4.3  Chloride 98 - 111 mmol/L 99 100 100  CO2 22 - 32 mmol/L 28 22 26   Calcium 8.9 - 10.3 mg/dL 9.7 10.0 10.0   CBC Latest Ref Rng & Units 11/17/2021 11/16/2021 11/15/2021  WBC 4.0 - 10.5 K/uL 12.7(H) 14.4(H) 16.5(H)  Hemoglobin 12.0 - 15.0 g/dL 9.9(L) 9.2(L) 9.0(L)  Hematocrit 36.0 - 46.0 % 31.8(L) 29.6(L) 29.6(L)  Platelets 150 - 400 K/uL 509(H) 572(H) 630(H)    Discharge Instructions: Discharge Instructions     Diet - low sodium heart healthy   Complete by: As directed    Discharge instructions   Complete by: As directed    Tammy Moses,  You were recently admitted to [facility] for open wound in her right groin.  This was closed by your surgeons but you will need to keep wound VAC in place and they will follow-up with you soon.  Continue taking your  home medications as you were before.  You should seek further medical care if develops fevers, chills, or bleeding from surgical site.  We recommend that you see your primary care doctor in about a week to make sure that you continue to improve. We are so glad that you are feeling better.  Sincerely, Christiana Fuchs, DO   Discharge wound care:   Complete by: As directed    Keep wound VAC in place until follow-up with vascular surgery.  Change Monday Wednesday Friday.   Increase activity slowly   Complete by: As directed        Signed: Christiana Fuchs, DO 11/17/2021, 7:30 AM   Pager: [phone]

## 2021-11-16 DIAGNOSIS — Z89411 Acquired absence of right great toe: Secondary | ICD-10-CM

## 2021-11-16 DIAGNOSIS — T8781 Dehiscence of amputation stump: Secondary | ICD-10-CM

## 2021-11-16 LAB — CBC
HCT: 29.6 % — ABNORMAL LOW (ref 36.0–46.0)
Hemoglobin: 9.2 g/dL — ABNORMAL LOW (ref 12.0–15.0)
MCH: 28 pg (ref 26.0–34.0)
MCHC: 31.1 g/dL (ref 30.0–36.0)
MCV: 90.2 fL (ref 80.0–100.0)
Platelets: 572 10*3/uL — ABNORMAL HIGH (ref 150–400)
RBC: 3.28 MIL/uL — ABNORMAL LOW (ref 3.87–5.11)
RDW: 17.1 % — ABNORMAL HIGH (ref 11.5–15.5)
WBC: 14.4 10*3/uL — ABNORMAL HIGH (ref 4.0–10.5)
nRBC: 0 % (ref 0.0–0.2)

## 2021-11-16 LAB — BASIC METABOLIC PANEL
Anion gap: 15 (ref 5–15)
BUN: 43 mg/dL — ABNORMAL HIGH (ref 6–20)
CO2: 22 mmol/L (ref 22–32)
Calcium: 10 mg/dL (ref 8.9–10.3)
Chloride: 100 mmol/L (ref 98–111)
Creatinine, Ser: 8.71 mg/dL — ABNORMAL HIGH (ref 0.44–1.00)
GFR, Estimated: 5 mL/min — ABNORMAL LOW (ref >60)
Glucose, Bld: 87 mg/dL (ref 70–99)
Potassium: 4.2 mmol/L (ref 3.5–5.1)
Sodium: 137 mmol/L (ref 135–145)

## 2021-11-16 LAB — RESP PANEL BY RT-PCR (FLU A&B, COVID) ARPGX2
Influenza A by PCR: NEGATIVE
Influenza B by PCR: NEGATIVE
SARS Coronavirus 2 by RT PCR: NEGATIVE

## 2021-11-16 LAB — HEMOGLOBIN A1C
Hgb A1c MFr Bld: 5.5 % (ref 4.8–5.6)
Mean Plasma Glucose: 111 mg/dL

## 2021-11-16 LAB — GLUCOSE, CAPILLARY: Glucose-Capillary: 101 mg/dL — ABNORMAL HIGH (ref 70–99)

## 2021-11-16 MED ORDER — QUETIAPINE FUMARATE 25 MG PO TABS
25.0000 mg | ORAL_TABLET | Freq: Every day | ORAL | Status: AC
  Administered 2021-11-16: 25 mg via ORAL
  Filled 2021-11-16: qty 1

## 2021-11-16 MED ORDER — DARBEPOETIN ALFA 40 MCG/0.4ML IJ SOSY
40.0000 ug | PREFILLED_SYRINGE | INTRAMUSCULAR | Status: DC
  Administered 2021-11-16: 40 ug via INTRAVENOUS
  Filled 2021-11-16: qty 0.4

## 2021-11-16 MED ORDER — HEPARIN SODIUM (PORCINE) 1000 UNIT/ML DIALYSIS
1500.0000 [IU] | INTRAMUSCULAR | Status: DC | PRN
  Filled 2021-11-16: qty 2

## 2021-11-16 NOTE — Progress Notes (Signed)
HD#1 SUBJECTIVE:  Patient Summary: Tammy Moses is a 56 y.o. 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   Overnight Events: None  Interim History: Patient assessed at bedside  in dialysis this AM.  She denies concerns at this time.  She does not have other complaints currently.  OBJECTIVE:  Vital Signs: Vitals:   11/16/21 1130 11/16/21 1200 11/16/21 1230 11/16/21 1248  BP: (!) 192/78 (!) 180/65 (!) 169/81 (!) 196/77  Pulse: 76 77 77 77  Resp: 14   14  Temp:    97.8 F (36.6 C)  TempSrc:    Temporal  SpO2:    100%  Weight:    48.5 kg  Height:       Supplemental O2: Room Air SpO2: 100 % O2 Flow Rate (L/min): 6 L/min  Filed Weights   11/14/21 0933 11/16/21 0906 11/16/21 1248  Weight: 51.8 kg 51.2 kg 48.5 kg     Intake/Output Summary (Last 24 hours) at 11/16/2021 1331 Last data filed at 11/16/2021 1248 Gross per 24 hour  Intake 240 ml  Output 2500 ml  Net -2260 ml    Net IO Since Admission: -2,090 mL [11/16/21 1331]  Physical Exam: Constitutional: well-appearing, sitting in bed, in no acute distress HENT: normocephalic atraumatic, mucous membranes moist Eyes: opacification OD, able to track with OS Neck: supple Cardiovascular: regular rate and rhythm, no m/r/g Pulmonary/Chest: normal work of breathing on room air, lungs clear to auscultation bilaterally Abdominal: soft, non-tender, non-distended MSK: normal bulk and tone, wound VAC in place to right groin, great toe amputation site nonhealing Neurological: alert & oriented x 3 Skin: warm and dry Psych: normal mood and affect   Patient Lines/Drains/Airways Status     Active Line/Drains/Airways     Name Placement date Placement time Site Days   Peripheral IV 11/14/21 20 G Anterior;Right Forearm 11/14/21   0916  Forearm  1   Peripheral IV 11/14/21 20 G Right Forearm 11/14/21  0930  Forearm  1   Fistula / Graft Left Upper arm 07/12/17  1900  Upper arm  1587   Negative Pressure Wound Therapy Groin Anterior;Proximal;Right 11/14/21  1102  --  1   Incision (Closed) 10/30/21 Groin Right 10/30/21  1402  -- 16   Incision (Closed) 10/30/21 Foot Right 10/30/21  1426  -- 16   Incision (Closed) 11/14/21 Groin Right 11/14/21  1105  -- 1   Wound / Incision (Open or Dehisced) 07/12/17 Other (Comment) Arm Left 07/12/17  1900  Arm  1587            Pertinent Labs: CBC Latest Ref Rng & Units 11/16/2021 11/15/2021 11/13/2021  WBC 4.0 - 10.5 K/uL 14.4(H) 16.5(H) 14.3(H)  Hemoglobin 12.0 - 15.0 g/dL 9.2(L) 9.0(L) 9.8(L)  Hematocrit 36.0 - 46.0 % 29.6(L) 29.6(L) 32.1(L)  Platelets 150 - 400 K/uL 572(H) 630(H) 576(H)    CMP Latest Ref Rng & Units 11/16/2021 11/15/2021 11/13/2021  Glucose 70 - 99 mg/dL 87 139(H) 207(H)  BUN 6 - 20 mg/dL 43(H) 33(H) 18  Creatinine 0.44 - 1.00 mg/dL 8.71(H) 6.56(H) 4.61(H)  Sodium 135 - 145 mmol/L 137 140 139  Potassium 3.5 - 5.1 mmol/L 4.2 4.3 4.1  Chloride 98 - 111 mmol/L 100 100 99  CO2 22 - 32 mmol/L 22 26 25   Calcium 8.9 -  10.3 mg/dL 10.0 10.0 10.0  Total Protein 6.5 - 8.1 g/dL - - -  Total Bilirubin 0.3 - 1.2 mg/dL - - -  Alkaline Phos 38 - 126 U/L - - -  AST 15 - 41 U/L - - -  ALT 0 - 44 U/L - - -    Recent Labs    11/15/21 1055 11/15/21 1601 11/16/21 0556  GLUCAP 110* 112* 101*      Pertinent Imaging: No results found.  ASSESSMENT/PLAN:  Assessment: Principal Problem:   Wound dehiscence Active Problems:   ESRD (end stage renal disease) ([place]) -  Dialyzes at [facility].  On Monday Wednesday Friday.   Peripheral arterial disease (HCC)   Complication of surgical procedure   Tammy Moses is a 56 y.o. 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   Plan: #Right groin wound dehiscence with underlying hematoma #Nonhealing right great toe amputation site #Severe PAD Patient will follow-up with vascular surgery outpatient. Wound vac to remain in place until then.  -PT recommending Rehab -OT eval -Continue wound [facility] Monday Wednesday Friday.   #ESRD HD MWF #Anemia of chronic disease #Severe protein calorie malnutrition Patient receives HD MWF, received dialysis today. Following OT eval patient should be going home tomorrow.  #Well-controlled Type 2 diabetes mellitus Last Hgba1c of 5.9. Patient does not take medications for diabetes. Glucose remained controlled during admission in 100s without insulin.   #Chronic HFpEF #HTN Last EF of 60 -65% 01/23. Home medications include Losartan 100 mg, amlodipine 10 mg, hydralazine 100 mg q 8 hrs, and Labetalol 200 mg.    #Depression Home medications include Lamictal and bupropion 150 mg BID.  Unclear why patient takes Lamictal.  Spoke with patient's niece and she stated that patient had been more confused since November.  At times since November she has had difficulty identifying family members and forgets what has recently happened.  Patient has been removing bandages from right toe amputation at facility.  She was noted to be doing this last night to wound VAC due to confusion. -Hold Lamictal  -Continue bupropion -Seroquel 12.5 mg qhs prn  #HLD Atorvastatin 80 mg  Best Practice: Diet: Renal diet IVF: none VTE: heparin injection 5,000 Units Start: 11/15/21 0600 Code: Full AB: none Therapy Recs: SNF Family Contact: updated niece DISPO: Anticipated discharge tomorrow to Skilled nursing facility pending PT eval.  Signature: Eulalio Reamy M. Katelan Hirt, D.O.  Internal Medicine Resident, PGY-1 [facility]  Pager: [phone] 1:31 PM, 11/16/2021   Please contact the  on call pager after 5 pm and on weekends at [phone].

## 2021-11-16 NOTE — Consult Note (Signed)
WOC Nurse Consult Note: Reason for Consult: Routine NPWT dressing change to right groin.  Vascular procedure Saturday, 1/21. (Dr. Rosalia Hammers)  Patient has removed NPWT dressing several times since then. Wound type:Surgical Pressure Injury POA: N/A Measurement:8.5cm x 1.5cm x 1cm  Wound bed: 70% red, 30% yellow Drainage (amount, consistency, odor) moist wound bed, serous Periwound: intact, macerated Dressing procedure/placement/frequency: NPWT dressing removed, the current dressing is leaking due to patient having removed/lifted part of drape.  WOund cleanse with NS, patted dry.  Skin barrier ring used to protect periwound skin and enhance seal.  1 piece of black foam used to obliterate dead space and a cotton tipped applicator is used to tuck into area of greatest depth at 6 o'clock.  The foam is covered with drape and an immediate seal is obtained. The dressing is labeled and patient is instructed to leave dressing in place. She indicates understanding.  Supplies for next dressing change are in the room.  ! New dressing kit and 1 skin barrier ring.  [place] nursing team will follow, and will remain available to this patient, the nursing and medical teams.  Next scheduled dressing change is WED, 11/18/21. Thanks, Maudie Flakes, MSN, RN, [place], Ar[facility]  Pager# [phone]

## 2021-11-16 NOTE — Progress Notes (Signed)
PIVs removed by patient. Patient refused new IV access. Notified MD.

## 2021-11-16 NOTE — Progress Notes (Signed)
Patient has pulled the wound vac off one time during the night, it has been replaced by the Butch Penny who also educated the patient on the risk of infection and the possibility of none healing the wound properly and/or  on time.   No output noted on the vac during the shift.

## 2021-11-16 NOTE — Evaluation (Signed)
Occupational Therapy Evaluation Patient Details Name: Tammy Moses MRN: [id number] DOB: [birth year] Today's Date: 11/16/2021   History of Present Illness Pt is a 56 y.o. who presented with bleeding from right groin surgical site and admitted for right groin wound dehiscence.   Patient with recent great toe amputation and femoral endarterectomy 10/30/21.   PMH pertinent for ESRD on HD MWF, type 2 diabetes hypertension, normocytic anemia secondary to CKD, COPD, blindness OD, tobacco use disorder.  Per recent PT note, also with recent patella fx on 10/21/21.   Clinical Impression   PTA, pt with recent admission and dc to SNF for rehab. Pt currently requiring Mod A for LB ADLs and Min A for functional transfers. Pt presenting with decreased balance and strength. Pt would benefit from further acute OT to facilitate safe dc. Recommend dc to SNF for further OT to optimize safety, independence with ADLs, and return to PLOF.       Recommendations for follow up therapy are one component of a multi-disciplinary discharge planning process, led by the attending physician.  Recommendations may be updated based on patient status, additional functional criteria and insurance authorization.   Follow Up Recommendations  Skilled nursing-short term rehab (<3 hours/day)    Assistance Recommended at Discharge Frequent or constant Supervision/Assistance  Patient can return home with the following A lot of help with walking and/or transfers;Two people to help with walking and/or transfers;A lot of help with bathing/dressing/bathroom;Assistance with cooking/housework;Help with stairs or ramp for entrance;Assist for transportation    Functional Status Assessment  Patient has had a recent decline in their functional status and demonstrates the ability to make significant improvements in function in a reasonable and predictable amount of time.  Equipment Recommendations  BSC/3in1    Recommendations for Other Services  PT consult     Precautions / Restrictions Precautions Precautions: Fall Precaution Comments: blind, R foot drop, R wound vac Required Braces or Orthoses: Knee Immobilizer - Right (KI not on upon arrival) Knee Immobilizer - Right: On at all times Restrictions Other Position/Activity Restrictions: need to clarify WB status and knee immobilizer.  Was NWB and knee immobilizer at all times as of 11/05/21.  Messaged MD.      Mobility Bed Mobility Overal bed mobility: Modified Independent Bed Mobility: Supine to Sit, Sit to Supine     Supine to sit: Modified independent (Device/Increase time) Sit to supine: Modified independent (Device/Increase time)        Transfers Overall transfer level: Needs assistance Equipment used: Rolling walker (2 wheels) Transfers: Sit to/from Stand Sit to Stand: Min assist           General transfer comment: Min A for gaining balance      Balance Overall balance assessment: Needs assistance Sitting-balance support: No upper extremity supported, Feet supported Sitting balance-Leahy Scale: Good     Standing balance support: Bilateral upper extremity supported, Reliant on assistive device for balance Standing balance-Leahy Scale: Poor Standing balance comment: reliant on UE support to maintain balance and WB precautions                           ADL either performed or assessed with clinical judgement   ADL Overall ADL's : Needs assistance/impaired Eating/Feeding: Set up;Bed level Eating/Feeding Details (indicate cue type and reason): uses clock method for self feeding Grooming: Set up;Bed level   Upper Body Bathing: Set up;Supervision/ safety;Sitting   Lower Body Bathing: Moderate assistance;Sit to/from stand   Upper  Body Dressing : Minimal assistance;Sitting   Lower Body Dressing: Moderate assistance;Sit to/from stand               Functional mobility during ADLs: Cueing for sequencing;Cueing for safety;Minimal  assistance;Rolling walker (2 wheels) General ADL Comments: Pt performing sit<>stand from EOB. Min A for power up and maibntainign balance on LLE only     Vision Baseline Vision/History: 2 Legally blind Ability to See in Adequate Light: 3 Highly impaired Patient Visual Report: No change from baseline Vision Assessment?: Vision impaired- to be further tested in functional context     Perception     Praxis      Pertinent Vitals/Pain Pain Assessment Pain Assessment: Faces Faces Pain Scale: Hurts little more Pain Location: R LE groin Pain Descriptors / Indicators: Constant, Discomfort, Guarding Pain Intervention(s): Limited activity within patient's tolerance, Monitored during session, Repositioned     Hand Dominance Right   Extremity/Trunk Assessment Upper Extremity Assessment Upper Extremity Assessment: Overall WFL for tasks assessed   Lower Extremity Assessment Lower Extremity Assessment: Defer to PT evaluation;RLE deficits/detail RLE Deficits / Details: R foot drop at baseline.   Cervical / Trunk Assessment Cervical / Trunk Assessment: Normal   Communication Communication Communication: No difficulties   Cognition Arousal/Alertness: Awake/alert Behavior During Therapy: WFL for tasks assessed/performed Overall Cognitive Status: No family/caregiver present to determine baseline cognitive functioning                                 General Comments: Very agreeable to therapy     General Comments  VSS    Exercises     Shoulder Instructions      Home Living Family/patient expects to be discharged to:: Skilled nursing facility                                 Additional Comments: Recent admission and dc to SNF rehab      Prior Functioning/Environment Prior Level of Function : Needs assist       Physical Assist : Mobility (physical) Mobility (physical): Bed mobility;Transfers;Gait;Stairs   Mobility Comments: has been unable to  walk since knee injury per patient; recieving PT at SNF per patient ADLs Comments: Aide comes 2 hours/day before recent dc to SNF. Staff assisting with ADLs as pt is NWB RLE        OT Problem List: Decreased strength;Decreased activity tolerance;Impaired balance (sitting and/or standing);Decreased safety awareness;Decreased knowledge of use of DME or AE;Decreased knowledge of precautions;Pain      OT Treatment/Interventions: Self-care/ADL training;Therapeutic exercise;Energy conservation;DME and/or AE instruction;Therapeutic activities;Cognitive remediation/compensation;Patient/family education;Balance training    OT Goals(Current goals can be found in the care plan section) Acute Rehab OT Goals OT Goal Formulation: With patient Time For Goal Achievement: 11/19/21 Potential to Achieve Goals: Good  OT Frequency: Min 2X/week    Co-evaluation              AM-PAC OT "6 Clicks" Daily Activity     Outcome Measure Help from another person eating meals?: None Help from another person taking care of personal grooming?: A Little Help from another person toileting, which includes using toliet, bedpan, or urinal?: A Lot Help from another person bathing (including washing, rinsing, drying)?: A Lot Help from another person to put on and taking off regular upper body clothing?: A Little Help from another person to put on and taking  off regular lower body clothing?: Total 6 Click Score: 15   End of Session Equipment Utilized During Treatment: Rolling walker (2 wheels) (no KI in room) Nurse Communication: Mobility status  Activity Tolerance: Patient tolerated treatment well Patient left: in bed;with call bell/phone within reach;with nursing/sitter in room;with bed alarm set  OT Visit Diagnosis: Unsteadiness on feet (R26.81);Muscle weakness (generalized) (M62.81);Low vision, both eyes (H54.2)                Time: 2549-8264 OT Time Calculation (min): 8 min Charges:  OT General Charges $OT  Visit: 1 Visit OT Evaluation $OT Eval Moderate Complexity: Dix, OTR/L Acute Rehab Pager: [phone] Office: [place] 11/16/2021, 2:59 PM

## 2021-11-16 NOTE — TOC Progression Note (Signed)
Transition of Care (TOC) - Progression Note  ° ° °Patient Details  °Name: Marcayla R Sarsfield °MRN: [id number] °Date of Birth: 04/02/1966 ° °Transition of Care (TOC) CM/SW Contact  ° N , LCSW °Phone Number: °11/16/2021, 12:43 PM ° °Clinical Narrative:    ° °CSW informed [facility] of anticipated d/c tomorrow if insurance authorization is approved-also advised patient will need would need wound vac  ° °Patient will need to be seen by OT for insurance approval  °Requested order for OT and covid test  ° °CSW will continue to follow and assist with discharge planning. ° ° , MSW, LCSW °Clinical Social Worker ° ° ° ° °Expected Discharge Plan: Skilled Nursing Facility °Barriers to Discharge: Continued Medical Work up, Insurance Authorization, Facility will not accept until restraint criteria met ° °Expected Discharge Plan and Services °Expected Discharge Plan: Skilled Nursing Facility °  °  °Post Acute Care Choice: Skilled Nursing Facility °Living arrangements for the past 2 months: Apartment, Skilled Nursing Facility °Expected Discharge Date: 11/15/21               °  °  °  °  °  °  °  °  °  °  ° ° °Social Determinants of Health (SDOH) Interventions °  ° °Readmission Risk Interventions °Readmission Risk Prevention Plan 10/09/2021  °Transportation Screening Complete  °HRI or Home Care Consult Complete  °SW Recovery Care/Counseling Consult Complete  °Palliative Care Screening Not Applicable  °Skilled Nursing Facility Complete  °Some recent data might be hidden  ° ° °

## 2021-11-16 NOTE — Progress Notes (Addendum)
° °  VASCULAR SURGERY ASSESSMENT & PLAN:   POD 2 EVACUATION OF LYMPHOCELE RIGHT GROIN: This patient presented with a large lymphocele and dehiscence of her right groin wound.  She underwent evacuation of the lymphocele.  I was able to close the deep layer over the artery and then applied a VAC.  The [facility] is to be changed on Mondays Wednesdays and Fridays.  It may be a challenge to keep her from picking at the dressing.  I do not see any recorded output from the [facility].  I have ordered for the nurses to keep track of this.  NONHEALING RIGHT GREAT TOE AMPUTATION SITE: She has poor healing of her right great toe amputation site.  In reviewing her records it looks like she does not have any further options for revascularization.  If this fails to heal she may ultimately require a more proximal amputation.   SUBJECTIVE:   No specific complaints this morning.  PHYSICAL EXAM:   Vitals:   11/15/21 0748 11/15/21 1228 11/15/21 1607 11/15/21 2330  BP: (!) 180/80 (!) 157/64 (!) 150/55 (!) 166/72  Pulse: 78 80 77   Resp: 17 17 17 17   Temp: 98.2 F (36.8 C) 98.5 F (36.9 C) (!) 97.5 F (36.4 C) 98.9 F (37.2 C)  TempSrc: Axillary Axillary Axillary Oral  SpO2: 99% 100% 99% 100%  Weight:      Height:       The VAC on the right groin has a good seal. No change in the right great toe amputation site which has some ischemic edges.  LABS:   Lab Results  Component Value Date   WBC 14.4 (H) 11/16/2021   HGB 9.2 (L) 11/16/2021   HCT 29.6 (L) 11/16/2021   MCV 90.2 11/16/2021   PLT 572 (H) 11/16/2021   Lab Results  Component Value Date   CREATININE 8.71 (H) 11/16/2021   Lab Results  Component Value Date   INR 1.2 10/21/2021   CBG (last 3)  Recent Labs    11/15/21 1055 11/15/21 1601 11/16/21 0556  GLUCAP 110* 112* 101*    PROBLEM LIST:    Principal Problem:   Wound dehiscence Active Problems:   ESRD (end stage renal disease) ([place]) -  Dialyzes at [facility].  On Monday Wednesday  Friday.   Peripheral arterial disease ([facility])   Complication of surgical procedure   CURRENT MEDS:    amLODipine  10 mg Oral Daily   aspirin EC  81 mg Oral Daily   atorvastatin  80 mg Oral Daily   atropine  1 drop Both Eyes BID   buPROPion  150 mg Oral Daily   calcium acetate  2,001 mg Oral TID WC   Chlorhexidine Gluconate Cloth  6 each Topical Q0600   dorzolamide-timolol  1 drop Both Eyes BID   furosemide  40 mg Oral BID   heparin  5,000 Units Subcutaneous Q8H   hydrALAZINE  100 mg Oral TID   labetalol  200 mg Oral BID   lamoTRIgine  25 mg Oral Daily   latanoprost  1 drop Both Eyes QHS   losartan  100 mg Oral Daily   multivitamin  1 tablet Oral QHS   pregabalin  25 mg Oral Daily    Deitra Mayo Office: [phone] 11/16/2021

## 2021-11-16 NOTE — Progress Notes (Addendum)
[facility] Progress Note  Subjective: alert, seen in HD, no c/o  Vitals:   11/16/21 1000 11/16/21 1030 11/16/21 1100 11/16/21 1130  BP: (!) 161/71 (!) 191/83 (!) 205/78 (!) 192/78  Pulse: 77 78 77 76  Resp:  16 15 14   Temp:      TempSrc:      SpO2:      Weight:      Height:        Exam: Gen alert, no distress No rash, cyanosis or gangrene Sclera anicteric, throat clear  No jvd or bruits Chest clear bilat to bases, no rales/ wheezing RRR no MRG Abd soft ntnd no mass or ascites +bs MS no joint effusions or deformity Ext no LE or UE edema, no wounds or ulcers Neuro is alert, Ox 3 , nf  LUA AVF+bruit    Home meds include - norvasc, asa, lipitor, wellbutrin sr, phoslo 3 ac tid, hydralazine 100 tid, hydromorphone prn , insulin lispro ssi, labetalol 200 bid, lamictal, losartan 100, rena-vit, lyrica 25 qd, lasix 40 bid, prns/ vits/ supps        OP HD: MWF [facility]   3.5h  52kg   350/600   AVF  Hep 1500 then 500u/hr  - EPO 4200 tiw w/ HD, last 1/20     Assessment/ Plan: R groin wound dehiscence - sp lymphocele evacuation w/ wound VAC placement. Per VVS.  ESRD - on HD MWF. Last HD Friday, has not missed. HD today.  BP/ volume - BP's high, no gross vol ^ on exam Anemia ckd - Hb 9s, on EPO w/ each HD at OP unit. 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  Phos okay at 5.3. on phoslo, will consdier changing binder to [place], will d/w pt tomorrow Depression - on bupropion, lamictal and lyrica Social - appears that pt lives in a SNF [place] 11/16/2021, 11:59 AM   Recent Labs  Lab 11/15/21 0108 11/16/21 0150  K 4.3 4.2  BUN 33* 43*  CREATININE 6.56* 8.71*  CALCIUM 10.0 10.0  PHOS 5.3*  --   HGB 9.0* 9.2*   Inpatient medications:  amLODipine  10 mg Oral Daily   aspirin EC  81 mg Oral Daily   atorvastatin  80 mg Oral Daily   atropine  1 drop Both Eyes BID   buPROPion  150 mg Oral Daily   calcium  acetate  2,001 mg Oral TID WC   Chlorhexidine Gluconate Cloth  6 each Topical Q0600   dorzolamide-timolol  1 drop Both Eyes BID   furosemide  40 mg Oral BID   heparin  5,000 Units Subcutaneous Q8H   hydrALAZINE  100 mg Oral TID   labetalol  200 mg Oral BID   lamoTRIgine  25 mg Oral Daily   latanoprost  1 drop Both Eyes QHS   losartan  100 mg Oral Daily   multivitamin  1 tablet Oral QHS   pregabalin  25 mg Oral Daily    acetaminophen, albuterol, [START ON 11/17/2021] heparin, oxyCODONE, senna-docusate

## 2021-11-17 ENCOUNTER — Other Ambulatory Visit (HOSPITAL_COMMUNITY): Payer: Self-pay

## 2021-11-17 DIAGNOSIS — E11621 Type 2 diabetes mellitus with foot ulcer: Secondary | ICD-10-CM | POA: Diagnosis not present

## 2021-11-17 DIAGNOSIS — S82001A Unspecified fracture of right patella, initial encounter for closed fracture: Secondary | ICD-10-CM | POA: Diagnosis not present

## 2021-11-17 DIAGNOSIS — F32A Depression, unspecified: Secondary | ICD-10-CM | POA: Diagnosis not present

## 2021-11-17 DIAGNOSIS — I959 Hypotension, unspecified: Secondary | ICD-10-CM | POA: Diagnosis not present

## 2021-11-17 DIAGNOSIS — M21371 Foot drop, right foot: Secondary | ICD-10-CM | POA: Diagnosis not present

## 2021-11-17 DIAGNOSIS — S98111D Complete traumatic amputation of right great toe, subsequent encounter: Secondary | ICD-10-CM | POA: Diagnosis not present

## 2021-11-17 DIAGNOSIS — E559 Vitamin D deficiency, unspecified: Secondary | ICD-10-CM | POA: Diagnosis not present

## 2021-11-17 DIAGNOSIS — W19XXXD Unspecified fall, subsequent encounter: Secondary | ICD-10-CM | POA: Diagnosis not present

## 2021-11-17 DIAGNOSIS — S98139A Complete traumatic amputation of one unspecified lesser toe, initial encounter: Secondary | ICD-10-CM | POA: Diagnosis not present

## 2021-11-17 DIAGNOSIS — R069 Unspecified abnormalities of breathing: Secondary | ICD-10-CM | POA: Diagnosis not present

## 2021-11-17 DIAGNOSIS — T8130XD Disruption of wound, unspecified, subsequent encounter: Secondary | ICD-10-CM | POA: Diagnosis not present

## 2021-11-17 DIAGNOSIS — E119 Type 2 diabetes mellitus without complications: Secondary | ICD-10-CM | POA: Diagnosis not present

## 2021-11-17 DIAGNOSIS — I739 Peripheral vascular disease, unspecified: Secondary | ICD-10-CM | POA: Diagnosis not present

## 2021-11-17 DIAGNOSIS — E43 Unspecified severe protein-calorie malnutrition: Secondary | ICD-10-CM | POA: Diagnosis not present

## 2021-11-17 DIAGNOSIS — E8729 Other acidosis: Secondary | ICD-10-CM | POA: Diagnosis not present

## 2021-11-17 DIAGNOSIS — I132 Hypertensive heart and chronic kidney disease with heart failure and with stage 5 chronic kidney disease, or end stage renal disease: Secondary | ICD-10-CM | POA: Diagnosis not present

## 2021-11-17 DIAGNOSIS — Z48812 Encounter for surgical aftercare following surgery on the circulatory system: Secondary | ICD-10-CM | POA: Diagnosis not present

## 2021-11-17 DIAGNOSIS — Z992 Dependence on renal dialysis: Secondary | ICD-10-CM | POA: Diagnosis not present

## 2021-11-17 DIAGNOSIS — Z743 Need for continuous supervision: Secondary | ICD-10-CM | POA: Diagnosis not present

## 2021-11-17 DIAGNOSIS — N2581 Secondary hyperparathyroidism of renal origin: Secondary | ICD-10-CM | POA: Diagnosis not present

## 2021-11-17 DIAGNOSIS — I10 Essential (primary) hypertension: Secondary | ICD-10-CM | POA: Diagnosis not present

## 2021-11-17 DIAGNOSIS — D631 Anemia in chronic kidney disease: Secondary | ICD-10-CM | POA: Diagnosis not present

## 2021-11-17 DIAGNOSIS — M869 Osteomyelitis, unspecified: Secondary | ICD-10-CM | POA: Diagnosis not present

## 2021-11-17 DIAGNOSIS — H402223 Chronic angle-closure glaucoma, left eye, severe stage: Secondary | ICD-10-CM | POA: Diagnosis not present

## 2021-11-17 DIAGNOSIS — I5032 Chronic diastolic (congestive) heart failure: Secondary | ICD-10-CM | POA: Diagnosis not present

## 2021-11-17 DIAGNOSIS — D509 Iron deficiency anemia, unspecified: Secondary | ICD-10-CM | POA: Diagnosis not present

## 2021-11-17 DIAGNOSIS — J810 Acute pulmonary edema: Secondary | ICD-10-CM | POA: Diagnosis not present

## 2021-11-17 DIAGNOSIS — L819 Disorder of pigmentation, unspecified: Secondary | ICD-10-CM | POA: Diagnosis not present

## 2021-11-17 DIAGNOSIS — J449 Chronic obstructive pulmonary disease, unspecified: Secondary | ICD-10-CM | POA: Diagnosis not present

## 2021-11-17 DIAGNOSIS — S82091D Other fracture of right patella, subsequent encounter for closed fracture with routine healing: Secondary | ICD-10-CM | POA: Diagnosis not present

## 2021-11-17 DIAGNOSIS — G9009 Other idiopathic peripheral autonomic neuropathy: Secondary | ICD-10-CM | POA: Diagnosis not present

## 2021-11-17 DIAGNOSIS — T8781 Dehiscence of amputation stump: Secondary | ICD-10-CM | POA: Diagnosis not present

## 2021-11-17 DIAGNOSIS — S82001D Unspecified fracture of right patella, subsequent encounter for closed fracture with routine healing: Secondary | ICD-10-CM | POA: Diagnosis not present

## 2021-11-17 DIAGNOSIS — N186 End stage renal disease: Secondary | ICD-10-CM | POA: Diagnosis not present

## 2021-11-17 DIAGNOSIS — E785 Hyperlipidemia, unspecified: Secondary | ICD-10-CM | POA: Diagnosis not present

## 2021-11-17 DIAGNOSIS — H44511 Absolute glaucoma, right eye: Secondary | ICD-10-CM | POA: Diagnosis not present

## 2021-11-17 LAB — BASIC METABOLIC PANEL
Anion gap: 9 (ref 5–15)
BUN: 24 mg/dL — ABNORMAL HIGH (ref 6–20)
CO2: 28 mmol/L (ref 22–32)
Calcium: 9.7 mg/dL (ref 8.9–10.3)
Chloride: 99 mmol/L (ref 98–111)
Creatinine, Ser: 5.5 mg/dL — ABNORMAL HIGH (ref 0.44–1.00)
GFR, Estimated: 8 mL/min — ABNORMAL LOW (ref >60)
Glucose, Bld: 92 mg/dL (ref 70–99)
Potassium: 4.1 mmol/L (ref 3.5–5.1)
Sodium: 136 mmol/L (ref 135–145)

## 2021-11-17 LAB — CBC
HCT: 31.8 % — ABNORMAL LOW (ref 36.0–46.0)
Hemoglobin: 9.9 g/dL — ABNORMAL LOW (ref 12.0–15.0)
MCH: 28 pg (ref 26.0–34.0)
MCHC: 31.1 g/dL (ref 30.0–36.0)
MCV: 89.8 fL (ref 80.0–100.0)
Platelets: 509 10*3/uL — ABNORMAL HIGH (ref 150–400)
RBC: 3.54 MIL/uL — ABNORMAL LOW (ref 3.87–5.11)
RDW: 17.1 % — ABNORMAL HIGH (ref 11.5–15.5)
WBC: 12.7 10*3/uL — ABNORMAL HIGH (ref 4.0–10.5)
nRBC: 0 % (ref 0.0–0.2)

## 2021-11-17 MED ORDER — QUETIAPINE FUMARATE 25 MG PO TABS
25.0000 mg | ORAL_TABLET | Freq: Every day | ORAL | 0 refills | Status: DC
  Filled 2021-11-17: qty 30, 30d supply, fill #0

## 2021-11-17 NOTE — Discharge Summary (Signed)
Name: Tammy Moses MRN: [id number] DOB: September 09, 1966 56 y.o. PCP: [name], FNP  Date of Admission: 11/13/2021  8:42 PM Date of Discharge: 11/17/21 Attending Physician: Charise Killian, MD  Discharge Diagnosis: 1. Right groin wound dehiscence with underlying hematoma 2. Nonhealing right great toe amputation site 3. Severe PAD  Chronic: ESRD HD MWF Anemia of chronic disease Severe protein calorie malnutrition Well-controlled Type 2 diabetes mellitus Chronic HFpEF Hypertension Depression Hyperlipidemia Discharge Medications: Allergies as of 11/17/2021       Reactions   Acyclovir And Related Itching        Medication List     TAKE these medications    acetaminophen 325 MG tablet Commonly known as: TYLENOL Take 2 tablets (650 mg total) by mouth every 4 (four) hours as needed for headache or mild pain.   albuterol 108 (90 Base) MCG/ACT inhaler Commonly known as: VENTOLIN HFA Inhale 2 puffs into the lungs every 4 (four) hours as needed for shortness of breath. For shortness of breath What changed:  reasons to take this additional instructions   amLODipine 10 MG tablet Commonly known as: NORVASC Take 1 tablet (10 mg total) by mouth daily.   aspirin 81 MG EC tablet Take 1 tablet (81 mg total) by mouth daily. Swallow whole.   atorvastatin 80 MG tablet Commonly known as: LIPITOR Take 1 tablet (80 mg total) by mouth daily.   atropine 1 % ophthalmic solution Place 1 drop into both eyes 2 (two) times daily.   buPROPion 150 MG 12 hr tablet Commonly known as: WELLBUTRIN SR Take 1 tablet (150 mg total) by mouth daily.   calcium acetate 667 MG capsule Commonly known as: PHOSLO Take 3 capsules (2,001 mg total) by mouth 3 (three) times daily with meals.   dorzolamide-timolol 22.3-6.8 MG/ML ophthalmic solution Commonly known as: COSOPT Place 1 drop into both eyes 2 (two) times daily.   furosemide 40 MG tablet Commonly known as: LASIX Take 40 mg by mouth 2  (two) times daily.   HumaLOG 100 UNIT/ML injection Generic drug: insulin lispro Inject into the skin 3 (three) times daily before meals. Inject as per sliding scale 150-200=2 units 201-250=4 units 251-300=6 units 301-350=8 units 351-400=10 units   hydrALAZINE 100 MG tablet Commonly known as: APRESOLINE Take 1 tablet (100 mg total) by mouth every 8 (eight) hours. What changed: when to take this   HYDROmorphone 2 MG tablet Commonly known as: DILAUDID Take 0.5 tablets (1 mg total) by mouth every 4 (four) hours as needed for severe pain.   labetalol 200 MG tablet Commonly known as: NORMODYNE Take 1 tablet (200 mg total) by mouth 2 (two) times daily.   lamoTRIgine 25 MG tablet Commonly known as: LAMICTAL Take 25 mg by mouth daily.   latanoprost 0.005 % ophthalmic solution Commonly known as: XALATAN Place 1 drop into both eyes at bedtime.   losartan 100 MG tablet Commonly known as: COZAAR Take 1 tablet (100 mg total) by mouth daily.   metoCLOPramide 10 MG tablet Commonly known as: REGLAN Take 10 mg by mouth in the morning and at bedtime.   multivitamin Tabs tablet Take 1 tablet by mouth at bedtime. What changed: when to take this   multivitamin with minerals tablet Take 1 tablet by mouth daily.   pregabalin 25 MG capsule Commonly known as: LYRICA Take 1 capsule (25 mg total) by mouth at bedtime. What changed: when to take this   QUEtiapine 25 MG tablet Commonly known as: SEROquel Take 1  tablet (25 mg total) by mouth at bedtime.   senna-docusate 8.6-50 MG tablet Commonly known as: Senokot-S Take 1 tablet by mouth at bedtime as needed for mild constipation. What changed: when to take this               Discharge Care Instructions  (From admission, onward)           Start     Ordered   11/15/21 0000  Discharge wound care:       Comments: Keep wound VAC in place until follow-up with vascular surgery.  Change Monday Wednesday Friday.   11/15/21 1131             Disposition and follow-up:   Ms.Micky R Aldaba was discharged from [facility] in Stable condition.  At the hospital follow up visit please address:  #Right groin wound dehiscence with underlying hematoma #Nonhealing right great toe amputation site #Severe PAD Patient presented due to wound dehiscence following right iliofemoral endarterectomy(01/06).  Patient was taken to the OR by Dr. Doren Custard and found to have a large lymphocele that was evacuated and wound VAC was placed to prevent recurrence of lymphocele.  Right first toe amputation site noted to not be well healing, vascular will follow.  Patient is at risk for more proximal amputation.  Please continue use of wound VAC with changes on Monday Wednesday Friday.  Patient will follow-up with vascular surgery.  Labs / imaging needed at time of follow-up: CBC, BMP  Pending labs/ test needing follow-up: None  Follow-up Appointments:  Follow-up Information     Serafina Mitchell, MD Follow up in 2 week(s).   Specialties: Vascular Surgery, Cardiology Why: Office will call you to arrange your appt (sent) Contact information: [place] [address] [phone]                Vascular surgery will arrange follow-up.  Hospital Course by problem list: #Right groin wound dehiscence with underlying hematoma #Nonhealing right great toe amputation site #Severe PAD Patient presented due to right groin wound dehiscence with underlying lymphocele.  She stated that she woke up with the wound open and she thinks she rolled on it.  On January 6 patient underwent right iliofemoral endarterectomy with vein patch angioplasty.  Angioplasty of right superficial femoral artery and popliteal artery was unsuccessful. She had a right great toe amputation at that time as well. At that time vascular was concerned for high risk of nonhealing that could lead to a below-knee versus above knee amputation.  Patient  was taken to the OR by Dr. Doren Custard.  Patient was found to have a large lymphocele that was evacuated.  Wound VAC was placed on remaining open wound with plans to continue wound VAC (change on MWF).  Patient will follow-up with vascular surgery outpatient.    #ESRD HD MWF #Anemia of chronic disease #Severe protein calorie malnutrition Patient receives HD MWF, last HD Friday.  Patient will resume regular outpatient HD tomorrow.   #Well-controlled Type 2 diabetes mellitus Last Hgba1c of 5.9. Patient does not take medications for diabetes. Glucose remained controlled during admission in 100s without insulin.  #Chronic HFpEF #HTN Last EF of 60 -65% 01/23. Home medications include Losartan 100 mg, amlodipine 10 mg, hydralazine 100 mg q 8 hrs, and Labetalol 200 mg.    #Depression Home medications include Lamictal and bupropion 150 mg twice daily. Unclear why patient takes lamictal. At times since November she has had difficulty identifying family members  and forgets what has recently happened.  Patient has been removing bandages from right toe amputation at facility.    #HLD Atorvastatin 80 mg   Subjective: Patient assessed at bedside this AM.  Endorses some pain in her groin but is otherwise doing well at this time. She does not have any concerns at this time.  Discharge Exam:   BP (!) 153/62 (BP Location: Right Arm)    Pulse 77    Temp 98 F (36.7 C) (Oral)    Resp 18    Ht 5' 0.98" (1.549 m)    Wt 48.5 kg    SpO2 98%    BMI 20.21 kg/m  Discharge exam:  Constitutional: well-appearing, sitting in bed, in no acute distress HENT: normocephalic atraumatic, mucous membranes moist Eyes: opacification OD, able to track with OS Neck: supple Cardiovascular: regular rate and rhythm, no m/r/g Pulmonary/Chest: normal work of breathing on room air, lungs clear to auscultation bilaterally Abdominal: soft, non-tender, non-distended MSK: normal bulk and tone, wound VAC in place to right groin, great toe  amputation site nonhealing Neurological: alert & oriented x 3 Skin: warm and dry Psych: normal mood and affect   Pertinent Labs, Studies, and Procedures:  BMP Latest Ref Rng & Units 11/17/2021 11/16/2021 11/15/2021  Glucose 70 - 99 mg/dL 92 87 139(H)  BUN 6 - 20 mg/dL 24(H) 43(H) 33(H)  Creatinine 0.44 - 1.00 mg/dL 5.50(H) 8.71(H) 6.56(H)  BUN/Creat Ratio 9 - 23 - - -  Sodium 135 - 145 mmol/L 136 137 140  Potassium 3.5 - 5.1 mmol/L 4.1 4.2 4.3  Chloride 98 - 111 mmol/L 99 100 100  CO2 22 - 32 mmol/L 28 22 26   Calcium 8.9 - 10.3 mg/dL 9.7 10.0 10.0   CBC Latest Ref Rng & Units 11/17/2021 11/16/2021 11/15/2021  WBC 4.0 - 10.5 K/uL 12.7(H) 14.4(H) 16.5(H)  Hemoglobin 12.0 - 15.0 g/dL 9.9(L) 9.2(L) 9.0(L)  Hematocrit 36.0 - 46.0 % 31.8(L) 29.6(L) 29.6(L)  Platelets 150 - 400 K/uL 509(H) 572(H) 630(H)    Discharge Instructions: Discharge Instructions     Diet - low sodium heart healthy   Complete by: As directed    Discharge instructions   Complete by: As directed    Ms. Kaeser,  You were recently admitted to [facility] for open wound in your right groin.  This was closed by your surgeons but you will need to keep wound VAC in place and they will follow-up with you soon.  Continue taking your home medications as you were before. Start- Seroquel 25 mg at night  You should seek further medical care if develops fevers, chills, or bleeding from surgical site.  We recommend that you see your primary care doctor in about a week to make sure that you continue to improve. We are so glad that you are feeling better.  Sincerely, Christiana Fuchs, DO   Discharge wound care:   Complete by: As directed    Keep wound VAC in place until follow-up with vascular surgery.  Change Monday Wednesday Friday.   Increase activity slowly   Complete by: As directed        Signed: Christiana Fuchs, DO 11/17/2021, 9:00 AM   Pager: [phone]

## 2021-11-17 NOTE — Progress Notes (Signed)
Physical Therapy Treatment Patient Details Name: Tammy Moses MRN: [id number] DOB: 18-Nov-1965 Today's Date: 11/17/2021   History of Present Illness Pt is a 56 y.o. admitted from SNF on 11/13/21 with R groin wound dehiscence; of note, pt with recent great toe amputation and femoral endarterectomy 10/30/21, as well as patella fx on 10/21/21. PMH includes ESRD (HD MWF), DM2, HTN, normocytic anemia secondary to CKD, COPD, blindness, diabetic retinopathy, tobacco use disorder.   PT Comments    Pt slowly progressing with mobility. Today's session focused on transfer training with RW, pt requiring minA for stability, standing tolerance limited due to RLE NWB and c/o bilateral foot pain. Pt remains limited by generalized weakness, decreased activity tolerance, poor balance strategies and impaired cognition. Continue to recommend return to SNF to maximize functional mobility and independence.    Recommendations for follow up therapy are one component of a multi-disciplinary discharge planning process, led by the attending physician.  Recommendations may be updated based on patient status, additional functional criteria and insurance authorization.  Follow Up Recommendations  Skilled nursing-short term rehab (<3 hours/day)     Assistance Recommended at Discharge Frequent or constant Supervision/Assistance  Patient can return home with the following A little help with walking and/or transfers;A little help with bathing/dressing/bathroom;Assist for transportation;Help with stairs or ramp for entrance;Assistance with cooking/housework   Equipment Recommendations  Rolling walker (2 wheels);Wheelchair (measurements PT);BSC/3in1    Recommendations for Other Services       Precautions / Restrictions Precautions Precautions: Fall Precaution Comments: R ground wound vac; R foot drop; blind Restrictions Other Position/Activity Restrictions: Still no clarification on WB orders and KI use - per chart  11/05/21, was to be RLE NWB with knee immobilizer on at all times     Mobility  Bed Mobility Overal bed mobility: Independent Bed Mobility: Supine to Sit, Sit to Supine           General bed mobility comments: bed flat    Transfers Overall transfer level: Needs assistance Equipment used: Rolling walker (2 wheels) Transfers: Sit to/from Stand Sit to Stand: Min assist Stand pivot transfers: Min assist         General transfer comment: pt opting to keep RLE NWB without cues, minA to stabilize RW upon standing; minA for RW management and stability for side hops at EOB; pt declining further mobility due to feet pain    Ambulation/Gait                   Stairs             Wheelchair Mobility    Modified Rankin (Stroke Patients Only)       Balance Overall balance assessment: Needs assistance Sitting-balance support: No upper extremity supported, Feet supported Sitting balance-Leahy Scale: Good     Standing balance support: Bilateral upper extremity supported, Reliant on assistive device for balance Standing balance-Leahy Scale: Poor Standing balance comment: reliant on UE support to maintain balance and WB precautions                            Cognition Arousal/Alertness: Awake/alert Behavior During Therapy: Flat affect Overall Cognitive Status: No family/caregiver present to determine baseline cognitive functioning                                 General Comments: flat affect, states she wants to participate then lays down  and closes eyes        Exercises      General Comments        Pertinent Vitals/Pain Pain Assessment Pain Assessment: Faces Faces Pain Scale: Hurts little more Pain Location: RLE Pain Descriptors / Indicators: Constant, Discomfort, Guarding Pain Intervention(s): Limited activity within patient's tolerance, Monitored during session    Home Living                          Prior  Function            PT Goals (current goals can now be found in the care plan section) Acute Rehab PT Goals Patient Stated Goal: "Get a lighter, I need a cigarette" Progress towards PT goals: Progressing toward goals    Frequency    Min 2X/week      PT Plan Current plan remains appropriate    Co-evaluation              AM-PAC PT "6 Clicks" Mobility   Outcome Measure  Help needed turning from your back to your side while in a flat bed without using bedrails?: None Help needed moving from lying on your back to sitting on the side of a flat bed without using bedrails?: None Help needed moving to and from a bed to a chair (including a wheelchair)?: A Little Help needed standing up from a chair using your arms (e.g., wheelchair or bedside chair)?: A Little Help needed to walk in hospital room?: Total Help needed climbing 3-5 steps with a railing? : Total 6 Click Score: 16    End of Session Equipment Utilized During Treatment: Gait belt Activity Tolerance: Patient limited by fatigue;Patient limited by pain Patient left: in bed;with call bell/phone within reach;with bed alarm set Nurse Communication: Mobility status PT Visit Diagnosis: Unsteadiness on feet (R26.81);Muscle weakness (generalized) (M62.81);History of falling (Z91.81);Repeated falls (R29.6);Difficulty in walking, not elsewhere classified (R26.2)     Time: 8592-9244 PT Time Calculation (min) (ACUTE ONLY): 19 min  Charges:  $Therapeutic Activity: 8-22 mins                     Mabeline Caras, PT, DPT Acute Rehabilitation Services  Pager [phone] Office [place] 11/17/2021, 1:50 PM

## 2021-11-17 NOTE — Progress Notes (Signed)
[facility] [place]  Subjective  - Confused  Assessment/Planning: POD # Evacuation of lymphocele with vac placement. Vac Changes 3 x week MWF Poor healing right GT amputation site with dry gangrene.  If the right GT amputation fails to heal she will need a more proximal amputation. F/U as an OP in 2-3 weeks for right groin check and right amputation site check.    Objective (!) 153/62 77 98 F (36.7 C) (Oral) 18 98%  Intake/Output Summary (Last 24 hours) at 11/17/2021 0745 Last data filed at 11/17/2021 0300 Gross per 24 hour  Intake 490 ml  Output 2510 ml  Net -2020 ml    Right GT amputation site poor healing with dry gangrene Right groin with vac and good seal.  10 cc OP  Lungs non labored breathing   [name] 11/17/2021 7:45 AM --  Laboratory Lab Results: Recent Labs    11/16/21 0150 11/17/21 0210  WBC 14.4* 12.7*  HGB 9.2* 9.9*  HCT 29.6* 31.8*  PLT 572* 509*   BMET Recent Labs    11/16/21 0150 11/17/21 0210  NA 137 136  K 4.2 4.1  CL 100 99  CO2 22 28  GLUCOSE 87 92  BUN 43* 24*  CREATININE 8.71* 5.50*  CALCIUM 10.0 9.7    COAG Lab Results  Component Value Date   INR 1.2 10/21/2021   INR 1.2 09/16/2021   INR 0.88 01/30/2014   No results found for: PTT

## 2021-11-17 NOTE — Progress Notes (Signed)
Discharge instructions printed and given to [facility]. [facility] transporting patient to [facility].

## 2021-11-17 NOTE — Progress Notes (Signed)
Pt refused RN to do physical assessment. She requested to smoke cigarette and tried to get out of bed to smoke. She thought she is at her apartment. Pt became more frustrated after she knew that she was not allowed to smoke in the hospital. Pt was reoriented and reassured with high fall risk prevention. No immediate distress.  Temp 100.1 orally, BP 184/61 mmHg, HR 83, RR 18, SPO2 100% on room air. She got Hydralazine and Labetalol which scheduled to night. Wound vac seal intact. No active drainage. We will continue to monitor.  Kennyth Lose, RN

## 2021-11-17 NOTE — Progress Notes (Signed)
Pt to return to [facility] today. Contacted [facility] and spoke to [place]. Clinic advised of pt's d/c today and pt to resume care tomorrow. [facility] has access to [facility] epic to print d/c summary.   Melven Sartorius Renal Navigator [phone]

## 2021-11-17 NOTE — TOC Transition Note (Signed)
Transition of Care [facility]) - CM/SW Discharge Note   Patient Details  Name: Tammy Moses MRN: [id number] Date of Birth: 19-Nov-1965  [facility]) CM/SW Contact:  Vinie Sill, LCSW Phone Number: 11/17/2021, 10:05 AM   Clinical Narrative:     Received insurance authorization - reference # [id number] approved 01/23-01/25  Patient will Discharge to: [facility] Discharge Date: 11/17/2021 Family Notified: niece  Transport MA:UQJF  Per MD patient is ready for discharge. RN, patient, and facility notified of discharge. Discharge Summary sent to facility. RN given number for [phone]. Ambulance transport requested for patient.   Clinical Social Worker signing off.  Thurmond Butts, MSW, LCSW Clinical Social Worker     Final next level of care: Skilled Nursing Facility Barriers to Discharge: Barriers Resolved   Patient Goals and CMS Choice Patient states their goals for this hospitalization and ongoing recovery are:: back to [facility].gov Compare Post Acute Care list provided to:: Patient Choice offered to / list presented to : Patient  Discharge Placement              Patient chooses bed at: [facility] Patient to be transferred to facility by: ptar Name of family member notified: niece Patient and family notified of of transfer: 11/17/21  Discharge Plan and Services     Post Acute Care Choice: [place]                               Social Determinants of Health (SDOH) Interventions     Readmission Risk Interventions Readmission Risk Prevention Plan 10/09/2021  Transportation Screening Complete  HRI or Home Care Consult Complete  SW Recovery Care/Counseling Consult Complete  Palliative Care Screening Not Applicable  Skilled Nursing Facility Complete  Some recent data might be hidden

## 2021-11-17 NOTE — Progress Notes (Addendum)
Called to give report to [facility] [phone] with no answer. Will try again.  *called x3 no answer  Daymon Larsen, RN

## 2021-11-17 NOTE — Progress Notes (Signed)
Pt refused Heparin SQ and refused RN to check her pulses. She is agitated, oriented to herself and place, but disoriented to time and situations. Will monitor.  Kennyth Lose, RN

## 2021-11-18 DIAGNOSIS — E785 Hyperlipidemia, unspecified: Secondary | ICD-10-CM | POA: Diagnosis not present

## 2021-11-18 DIAGNOSIS — D509 Iron deficiency anemia, unspecified: Secondary | ICD-10-CM | POA: Diagnosis not present

## 2021-11-18 DIAGNOSIS — D631 Anemia in chronic kidney disease: Secondary | ICD-10-CM | POA: Diagnosis not present

## 2021-11-18 DIAGNOSIS — N186 End stage renal disease: Secondary | ICD-10-CM | POA: Diagnosis not present

## 2021-11-18 DIAGNOSIS — N2581 Secondary hyperparathyroidism of renal origin: Secondary | ICD-10-CM | POA: Diagnosis not present

## 2021-11-19 DIAGNOSIS — M869 Osteomyelitis, unspecified: Secondary | ICD-10-CM | POA: Diagnosis not present

## 2021-11-19 DIAGNOSIS — I739 Peripheral vascular disease, unspecified: Secondary | ICD-10-CM | POA: Diagnosis not present

## 2021-11-19 DIAGNOSIS — E43 Unspecified severe protein-calorie malnutrition: Secondary | ICD-10-CM | POA: Diagnosis not present

## 2021-11-19 DIAGNOSIS — S98139A Complete traumatic amputation of one unspecified lesser toe, initial encounter: Secondary | ICD-10-CM | POA: Diagnosis not present

## 2021-11-23 DIAGNOSIS — E785 Hyperlipidemia, unspecified: Secondary | ICD-10-CM | POA: Diagnosis not present

## 2021-11-23 DIAGNOSIS — D509 Iron deficiency anemia, unspecified: Secondary | ICD-10-CM | POA: Diagnosis not present

## 2021-11-23 DIAGNOSIS — N186 End stage renal disease: Secondary | ICD-10-CM | POA: Diagnosis not present

## 2021-11-23 DIAGNOSIS — N2581 Secondary hyperparathyroidism of renal origin: Secondary | ICD-10-CM | POA: Diagnosis not present

## 2021-11-23 DIAGNOSIS — D631 Anemia in chronic kidney disease: Secondary | ICD-10-CM | POA: Diagnosis not present

## 2021-11-24 DIAGNOSIS — Z992 Dependence on renal dialysis: Secondary | ICD-10-CM | POA: Diagnosis not present

## 2021-11-24 DIAGNOSIS — S82001A Unspecified fracture of right patella, initial encounter for closed fracture: Secondary | ICD-10-CM | POA: Diagnosis not present

## 2021-11-24 DIAGNOSIS — N186 End stage renal disease: Secondary | ICD-10-CM | POA: Diagnosis not present

## 2021-11-25 DIAGNOSIS — N2581 Secondary hyperparathyroidism of renal origin: Secondary | ICD-10-CM | POA: Diagnosis not present

## 2021-11-25 DIAGNOSIS — D509 Iron deficiency anemia, unspecified: Secondary | ICD-10-CM | POA: Diagnosis not present

## 2021-11-25 DIAGNOSIS — D631 Anemia in chronic kidney disease: Secondary | ICD-10-CM | POA: Diagnosis not present

## 2021-11-25 DIAGNOSIS — N186 End stage renal disease: Secondary | ICD-10-CM | POA: Diagnosis not present

## 2021-11-27 DIAGNOSIS — D631 Anemia in chronic kidney disease: Secondary | ICD-10-CM | POA: Diagnosis not present

## 2021-11-27 DIAGNOSIS — N186 End stage renal disease: Secondary | ICD-10-CM | POA: Diagnosis not present

## 2021-11-27 DIAGNOSIS — D509 Iron deficiency anemia, unspecified: Secondary | ICD-10-CM | POA: Diagnosis not present

## 2021-11-27 DIAGNOSIS — N2581 Secondary hyperparathyroidism of renal origin: Secondary | ICD-10-CM | POA: Diagnosis not present

## 2021-11-30 DIAGNOSIS — D631 Anemia in chronic kidney disease: Secondary | ICD-10-CM | POA: Diagnosis not present

## 2021-11-30 DIAGNOSIS — N2581 Secondary hyperparathyroidism of renal origin: Secondary | ICD-10-CM | POA: Diagnosis not present

## 2021-11-30 DIAGNOSIS — D509 Iron deficiency anemia, unspecified: Secondary | ICD-10-CM | POA: Diagnosis not present

## 2021-11-30 DIAGNOSIS — N186 End stage renal disease: Secondary | ICD-10-CM | POA: Diagnosis not present

## 2021-12-02 DIAGNOSIS — D509 Iron deficiency anemia, unspecified: Secondary | ICD-10-CM | POA: Diagnosis not present

## 2021-12-02 DIAGNOSIS — N2581 Secondary hyperparathyroidism of renal origin: Secondary | ICD-10-CM | POA: Diagnosis not present

## 2021-12-02 DIAGNOSIS — N186 End stage renal disease: Secondary | ICD-10-CM | POA: Diagnosis not present

## 2021-12-02 DIAGNOSIS — D631 Anemia in chronic kidney disease: Secondary | ICD-10-CM | POA: Diagnosis not present

## 2021-12-03 ENCOUNTER — Telehealth: Payer: Self-pay | Admitting: *Deleted

## 2021-12-03 DIAGNOSIS — S98139A Complete traumatic amputation of one unspecified lesser toe, initial encounter: Secondary | ICD-10-CM | POA: Diagnosis not present

## 2021-12-03 DIAGNOSIS — E43 Unspecified severe protein-calorie malnutrition: Secondary | ICD-10-CM | POA: Diagnosis not present

## 2021-12-03 DIAGNOSIS — M869 Osteomyelitis, unspecified: Secondary | ICD-10-CM | POA: Diagnosis not present

## 2021-12-03 DIAGNOSIS — I739 Peripheral vascular disease, unspecified: Secondary | ICD-10-CM | POA: Diagnosis not present

## 2021-12-03 NOTE — Telephone Encounter (Signed)
Tammy Moses called from [facility] suture in great toe site looks good. Foot is discolored.  I called and mailbox was full. Patient has an appointment 12/08/2021.

## 2021-12-04 DIAGNOSIS — D509 Iron deficiency anemia, unspecified: Secondary | ICD-10-CM | POA: Diagnosis not present

## 2021-12-04 DIAGNOSIS — N186 End stage renal disease: Secondary | ICD-10-CM | POA: Diagnosis not present

## 2021-12-04 DIAGNOSIS — N2581 Secondary hyperparathyroidism of renal origin: Secondary | ICD-10-CM | POA: Diagnosis not present

## 2021-12-04 DIAGNOSIS — D631 Anemia in chronic kidney disease: Secondary | ICD-10-CM | POA: Diagnosis not present

## 2021-12-07 DIAGNOSIS — D509 Iron deficiency anemia, unspecified: Secondary | ICD-10-CM | POA: Diagnosis not present

## 2021-12-07 DIAGNOSIS — N2581 Secondary hyperparathyroidism of renal origin: Secondary | ICD-10-CM | POA: Diagnosis not present

## 2021-12-07 DIAGNOSIS — D631 Anemia in chronic kidney disease: Secondary | ICD-10-CM | POA: Diagnosis not present

## 2021-12-07 DIAGNOSIS — N186 End stage renal disease: Secondary | ICD-10-CM | POA: Diagnosis not present

## 2021-12-09 DIAGNOSIS — N2581 Secondary hyperparathyroidism of renal origin: Secondary | ICD-10-CM | POA: Diagnosis not present

## 2021-12-09 DIAGNOSIS — N186 End stage renal disease: Secondary | ICD-10-CM | POA: Diagnosis not present

## 2021-12-09 DIAGNOSIS — D631 Anemia in chronic kidney disease: Secondary | ICD-10-CM | POA: Diagnosis not present

## 2021-12-09 DIAGNOSIS — D509 Iron deficiency anemia, unspecified: Secondary | ICD-10-CM | POA: Diagnosis not present

## 2021-12-09 NOTE — Progress Notes (Signed)
POST OPERATIVE OFFICE NOTE    CC:  F/u for surgery  HPI:    56 year old female who was seen in the hospital in December 2022 for right great toe gangrene.  She underwent aortogram on 10/27/2021 by 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  She subsequently underwent right iliofemoral endarterectomy with vein patch angioplasty and right great toe amputation including metatarsal head, failed angioplasty right SFA and popliteal artery on 10/30/2021 by Dr. [name] 11/14/2021, she was taken back to the OR for dehiscence of the right groin wound for evacuation of lymphocele and wound vac was placed by Dr. [name].  At discharge, she was scheduled for M/W/F wound vac change.  She was having poor healing of her GT amputation site.  She was scheduled for 2-3 week follow up for groin check and amp site check.  It is felt that if her amputation site is not healing, she will need a more proximal amputation.    Pt returns today for follow up and here with her niece and mother.  Pt states she has continued pain in the right foot.  Her niece is here with her and has concerns about her facility.  She states the pt has fallen and her foot has gotten caught up in her wheelchair several times.   Her mother states that the pt does not walk and she does not feel that she will walk again.    Pt has hx of ESRD on HD M/W/F, HTN, DM, COPD.  She is currently residing in SNF.  Allergies  Allergen Reactions   Acyclovir And Related Itching    Current Outpatient Medications  Medication Sig Dispense Refill   acetaminophen (TYLENOL) 325 MG tablet Take 2 tablets (650 mg total) by mouth every 4 (four) hours as needed for headache or mild pain.     albuterol (VENTOLIN HFA) 108 (90 Base) MCG/ACT inhaler Inhale 2 puffs into the lungs every 4 (four) hours as needed for shortness of breath. For shortness of breath (Patient taking differently: Inhale 2 puffs into the lungs every 4 (four)  hours as needed for shortness of breath or wheezing.) 8 g 11   amLODipine (NORVASC) 10 MG tablet Take 1 tablet (10 mg total) by mouth daily.     aspirin EC 81 MG EC tablet Take 1 tablet (81 mg total) by mouth daily. Swallow whole. 30 tablet 11   atorvastatin (LIPITOR) 80 MG tablet Take 1 tablet (80 mg total) by mouth daily.     atropine 1 % ophthalmic solution Place 1 drop into both eyes 2 (two) times daily.     buPROPion (WELLBUTRIN SR) 150 MG 12 hr tablet Take 1 tablet (150 mg total) by mouth daily.     calcium acetate (PHOSLO) 667 MG capsule Take 3 capsules (2,001 mg total) by mouth 3 (three) times daily with meals.     dorzolamide-timolol (COSOPT) 22.3-6.8 MG/ML ophthalmic solution Place 1 drop into both eyes 2 (two) times daily.     furosemide (LASIX) 40 MG tablet Take 40 mg by mouth 2 (two) times daily. (Patient not taking: Reported on 11/14/2021)     hydrALAZINE (APRESOLINE) 100 MG tablet Take 1 tablet (100 mg total) by mouth every 8 (eight) hours. (Patient taking differently: Take 100 mg by mouth 3 (three) times daily.)     HYDROmorphone (DILAUDID) 2 MG tablet Take 0.5 tablets (1 mg total) by mouth every 4 (four) hours as needed for severe pain. 30 tablet 0  insulin lispro (HUMALOG) 100 UNIT/ML injection Inject into the skin 3 (three) times daily before meals. Inject as per sliding scale 150-200=2 units 201-250=4 units 251-300=6 units 301-350=8 units 351-400=10 units     labetalol (NORMODYNE) 200 MG tablet Take 1 tablet (200 mg total) by mouth 2 (two) times daily.     lamoTRIgine (LAMICTAL) 25 MG tablet Take 25 mg by mouth daily.     latanoprost (XALATAN) 0.005 % ophthalmic solution Place 1 drop into both eyes at bedtime.     losartan (COZAAR) 100 MG tablet Take 1 tablet (100 mg total) by mouth daily.     metoCLOPramide (REGLAN) 10 MG tablet Take 10 mg by mouth in the morning and at bedtime.     Multiple Vitamins-Minerals (MULTIVITAMIN WITH MINERALS) tablet Take 1 tablet by mouth daily.      multivitamin (RENA-VIT) TABS tablet Take 1 tablet by mouth at bedtime. (Patient taking differently: Take 1 tablet by mouth daily.)  0   pregabalin (LYRICA) 25 MG capsule Take 1 capsule (25 mg total) by mouth at bedtime. (Patient taking differently: Take 25 mg by mouth daily.) 30 capsule 0   QUEtiapine (SEROQUEL) 25 MG tablet Take 1 tablet (25 mg total) by mouth at bedtime. 30 tablet 0   senna-docusate (SENOKOT-S) 8.6-50 MG tablet Take 1 tablet by mouth at bedtime as needed for mild constipation. (Patient taking differently: Take 1 tablet by mouth daily as needed for mild constipation.)     No current facility-administered medications for this visit.     ROS:  See HPI  Physical Exam:  Today's Vitals   12/15/21 0831  BP: (!) 148/61  Pulse: 79  Resp: 20  Temp: 98.1 F (36.7 C)  TempSrc: Temporal  SpO2: 100%  Height: 5' (1.524 m)  PainSc: 9   PainLoc: Foot   Body mass index is 20.88 kg/m.   Incision:   Right groin is healing despite wound vac not being on Right groin incision   Extremities:  + doppler signal right AT but no signal onto the foot.  The right great toe amputation has not healed and she has gangrenous changes to the foot      Assessment/Plan:  This is a 56 y.o. female who is s/p: right iliofemoral endarterectomy with vein patch angioplasty and right great toe amputation including metatarsal head, failed angioplasty right SFA and popliteal artery on 10/30/2021 by Dr. [name] 11/14/2021, she was taken back to the OR for dehiscence of the right groin wound for evacuation of lymphocele and wound vac was placed.   -pt chart reviewed with Dr. Carlis Abbott and angiogram reviewed.  Given pt does not walk and has a low chance of healing a below knee amputation, will plan for a right above knee amputation by either Dr. Carlis Abbott or [name] on a non dialysis day as she does not have any further revascularization options.  When discussing with pt, she has good understanding that  her leg with be amputated above the knee.  -she will need admission to the hosptialist service for management of her ESRD, DM, HTN, COPD. -during admission, they would like to speak with social worker about possible new facilities when she discharges from the hospital as they are not satisfied with current location.   Leontine Locket, [facility] [phone]   Clinic MD:  Carlis Abbott

## 2021-12-11 DIAGNOSIS — N2581 Secondary hyperparathyroidism of renal origin: Secondary | ICD-10-CM | POA: Diagnosis not present

## 2021-12-11 DIAGNOSIS — D509 Iron deficiency anemia, unspecified: Secondary | ICD-10-CM | POA: Diagnosis not present

## 2021-12-11 DIAGNOSIS — N186 End stage renal disease: Secondary | ICD-10-CM | POA: Diagnosis not present

## 2021-12-11 DIAGNOSIS — D631 Anemia in chronic kidney disease: Secondary | ICD-10-CM | POA: Diagnosis not present

## 2021-12-12 DIAGNOSIS — R2689 Other abnormalities of gait and mobility: Secondary | ICD-10-CM | POA: Diagnosis not present

## 2021-12-12 DIAGNOSIS — R262 Difficulty in walking, not elsewhere classified: Secondary | ICD-10-CM | POA: Diagnosis not present

## 2021-12-12 DIAGNOSIS — M6281 Muscle weakness (generalized): Secondary | ICD-10-CM | POA: Diagnosis not present

## 2021-12-12 DIAGNOSIS — S82001D Unspecified fracture of right patella, subsequent encounter for closed fracture with routine healing: Secondary | ICD-10-CM | POA: Diagnosis not present

## 2021-12-14 DIAGNOSIS — D631 Anemia in chronic kidney disease: Secondary | ICD-10-CM | POA: Diagnosis not present

## 2021-12-14 DIAGNOSIS — N186 End stage renal disease: Secondary | ICD-10-CM | POA: Diagnosis not present

## 2021-12-14 DIAGNOSIS — D509 Iron deficiency anemia, unspecified: Secondary | ICD-10-CM | POA: Diagnosis not present

## 2021-12-14 DIAGNOSIS — N2581 Secondary hyperparathyroidism of renal origin: Secondary | ICD-10-CM | POA: Diagnosis not present

## 2021-12-15 ENCOUNTER — Other Ambulatory Visit: Payer: Self-pay

## 2021-12-15 ENCOUNTER — Ambulatory Visit (INDEPENDENT_AMBULATORY_CARE_PROVIDER_SITE_OTHER): Payer: Medicare Other | Admitting: Physician Assistant

## 2021-12-15 ENCOUNTER — Encounter: Payer: Self-pay | Admitting: Physician Assistant

## 2021-12-15 VITALS — BP 148/61 | HR 79 | Temp 98.1°F | Resp 20 | Ht 60.0 in

## 2021-12-15 DIAGNOSIS — E1140 Type 2 diabetes mellitus with diabetic neuropathy, unspecified: Secondary | ICD-10-CM | POA: Diagnosis not present

## 2021-12-15 DIAGNOSIS — R262 Difficulty in walking, not elsewhere classified: Secondary | ICD-10-CM | POA: Diagnosis not present

## 2021-12-15 DIAGNOSIS — I96 Gangrene, not elsewhere classified: Secondary | ICD-10-CM

## 2021-12-15 DIAGNOSIS — M6281 Muscle weakness (generalized): Secondary | ICD-10-CM | POA: Diagnosis not present

## 2021-12-15 DIAGNOSIS — R2689 Other abnormalities of gait and mobility: Secondary | ICD-10-CM | POA: Diagnosis not present

## 2021-12-15 DIAGNOSIS — S82001D Unspecified fracture of right patella, subsequent encounter for closed fracture with routine healing: Secondary | ICD-10-CM | POA: Diagnosis not present

## 2021-12-15 DIAGNOSIS — I2510 Atherosclerotic heart disease of native coronary artery without angina pectoris: Secondary | ICD-10-CM | POA: Diagnosis not present

## 2021-12-15 DIAGNOSIS — S91301A Unspecified open wound, right foot, initial encounter: Secondary | ICD-10-CM

## 2021-12-15 DIAGNOSIS — G8929 Other chronic pain: Secondary | ICD-10-CM | POA: Diagnosis not present

## 2021-12-16 DIAGNOSIS — M6281 Muscle weakness (generalized): Secondary | ICD-10-CM | POA: Diagnosis not present

## 2021-12-16 DIAGNOSIS — D631 Anemia in chronic kidney disease: Secondary | ICD-10-CM | POA: Diagnosis not present

## 2021-12-16 DIAGNOSIS — N2581 Secondary hyperparathyroidism of renal origin: Secondary | ICD-10-CM | POA: Diagnosis not present

## 2021-12-16 DIAGNOSIS — D509 Iron deficiency anemia, unspecified: Secondary | ICD-10-CM | POA: Diagnosis not present

## 2021-12-16 DIAGNOSIS — N186 End stage renal disease: Secondary | ICD-10-CM | POA: Diagnosis not present

## 2021-12-16 DIAGNOSIS — R2689 Other abnormalities of gait and mobility: Secondary | ICD-10-CM | POA: Diagnosis not present

## 2021-12-16 DIAGNOSIS — S82001D Unspecified fracture of right patella, subsequent encounter for closed fracture with routine healing: Secondary | ICD-10-CM | POA: Diagnosis not present

## 2021-12-16 DIAGNOSIS — R262 Difficulty in walking, not elsewhere classified: Secondary | ICD-10-CM | POA: Diagnosis not present

## 2021-12-17 DIAGNOSIS — R2689 Other abnormalities of gait and mobility: Secondary | ICD-10-CM | POA: Diagnosis not present

## 2021-12-17 DIAGNOSIS — M6281 Muscle weakness (generalized): Secondary | ICD-10-CM | POA: Diagnosis not present

## 2021-12-17 DIAGNOSIS — S82001D Unspecified fracture of right patella, subsequent encounter for closed fracture with routine healing: Secondary | ICD-10-CM | POA: Diagnosis not present

## 2021-12-17 DIAGNOSIS — R262 Difficulty in walking, not elsewhere classified: Secondary | ICD-10-CM | POA: Diagnosis not present

## 2021-12-18 ENCOUNTER — Encounter (HOSPITAL_COMMUNITY): Payer: Self-pay | Admitting: Vascular Surgery

## 2021-12-18 DIAGNOSIS — R2689 Other abnormalities of gait and mobility: Secondary | ICD-10-CM | POA: Diagnosis not present

## 2021-12-18 DIAGNOSIS — N2581 Secondary hyperparathyroidism of renal origin: Secondary | ICD-10-CM | POA: Diagnosis not present

## 2021-12-18 DIAGNOSIS — D631 Anemia in chronic kidney disease: Secondary | ICD-10-CM | POA: Diagnosis not present

## 2021-12-18 DIAGNOSIS — N186 End stage renal disease: Secondary | ICD-10-CM | POA: Diagnosis not present

## 2021-12-18 DIAGNOSIS — S82001D Unspecified fracture of right patella, subsequent encounter for closed fracture with routine healing: Secondary | ICD-10-CM | POA: Diagnosis not present

## 2021-12-18 DIAGNOSIS — M6281 Muscle weakness (generalized): Secondary | ICD-10-CM | POA: Diagnosis not present

## 2021-12-18 DIAGNOSIS — D509 Iron deficiency anemia, unspecified: Secondary | ICD-10-CM | POA: Diagnosis not present

## 2021-12-18 DIAGNOSIS — R262 Difficulty in walking, not elsewhere classified: Secondary | ICD-10-CM | POA: Diagnosis not present

## 2021-12-18 NOTE — Progress Notes (Addendum)
I spoke to Tammy Moses' nurse, [place]. [place] reports that Tammy Moses vital and CBG have been in the normal range. [place] reports that patient dose not complain of shortness of breath or chest pain and has no signs of Covid. Tammy Moses will have a Covid test on arrival to the hospital.  Tammy Moses has a right groin wound for an incision for enterectomy that dehisced post op.  I faxed pre- procedure instructions to [place], [place] reviewed and did not have any question.  Tammy Moses current PCP is Dr. Peyton Bottoms. Tammy Moses with Tammy Moses.  I spoke with Tammy Wynetta Moses, Tammy Moses niece and informed her of surgery time and that only 1 person may be in the waiting are during Tammy Moses surgery.

## 2021-12-18 NOTE — Pre-Procedure Instructions (Signed)
° °   TZIPPY TESTERMAN  12/18/2021    Ms Alfredo 's surgical procedure is scheduled for  Monday 12/21/21.   Report to [facility], Main Entrance or Entrance "A" at 8:15 AM .                  Call this number if you have problems the morning of surgery: [phone]  This is the number for the Pre- Surgical Desk.              >>>>>Please send patient's Medication Record with medications administrated documentation. ( this information is required prior to OR. This includes medications that may have been on hold for surgery)<<<<<    Remember:  Do not eat or drink after midnight Sunday, 12/20/21.    Take these medicines the morning of surgery with A SIP OF WATER: Amlodipine Aspirin atorvastatin (LIPITOR atropine 1 % ophthalmic solution buPROPion (WELLBUTRIN SR)  hydrALAZINE (APRESOLINE)  labetalol (NORMODYNE) metoCLOPramide (Reglan)  Take/Use if needed: acetaminophen (TYLENOL) albuterol (VENTOLIN HFA) inhaler HYDROmorphone (DILAUDID)  STOP taking Aspirin Products (Goody Powder, Excedrin Migraine), Ibuprofen (Advil), Naproxen (Aleve), Vitamins and Herbal Products (ie Fish Oil).   Blood Sugar Checks Check  blood sugar the morning of  surgery when patient wakes up  wake up and every 2 hours until you get to the Short Stay unit. If your blood sugar is less than 70 mg/dL, you will need to treat for low blood sugar: Do not take insulin. Treat a low blood sugar (less than 70 mg/dL) with  cup of clear juice (cranberry or apple), 4 glucose tablets, OR glucose gel. Recheck blood sugar in 15 minutes after treatment (to make sure it is greater than 70 mg/dL). If your blood sugar is not greater than 70 mg/dL on recheck, call [phone]  for further instructions. If Ms Shrider  CBG is greater than 220 mg/dL, patient should be given   of your sliding scale (correction) dose of insulin. Report your blood sugar to the short stay nurse when you get to Short Stay. Ms Tartt should have a shower,  with antibacteria soap, the morning of surgery . Dry off with a clean towel.  Patient should not have lotions, powders, colognes, deodorant, jewelry, or piercing's, no nail polish. Wear clean comfortable clothes.  Brush teeth. Do not shave 48 hours prior to surgery.   Do not bring valuables to the hospital. [facility] is not responsible for any belongings or valuables.  If Ms. Shinsato wears glasses, if she has a case, please send it with her. If patient wears dentures, they should be clean and removable, a denture cup will be provided.

## 2021-12-19 DIAGNOSIS — R2689 Other abnormalities of gait and mobility: Secondary | ICD-10-CM | POA: Diagnosis not present

## 2021-12-19 DIAGNOSIS — S82001D Unspecified fracture of right patella, subsequent encounter for closed fracture with routine healing: Secondary | ICD-10-CM | POA: Diagnosis not present

## 2021-12-19 DIAGNOSIS — R262 Difficulty in walking, not elsewhere classified: Secondary | ICD-10-CM | POA: Diagnosis not present

## 2021-12-19 DIAGNOSIS — M6281 Muscle weakness (generalized): Secondary | ICD-10-CM | POA: Diagnosis not present

## 2021-12-21 ENCOUNTER — Encounter (HOSPITAL_COMMUNITY)
Admission: RE | Disposition: A | Discharge status: Skilled Nursing Facility | Payer: Self-pay | Source: Home / Self Care | Attending: Internal Medicine

## 2021-12-21 ENCOUNTER — Inpatient Hospital Stay (HOSPITAL_COMMUNITY): Payer: Medicare Other | Admitting: Anesthesiology

## 2021-12-21 ENCOUNTER — Inpatient Hospital Stay (HOSPITAL_COMMUNITY)
Admission: RE | Admit: 2021-12-21 | Discharge: 2021-12-26 | DRG: 239 | Disposition: A | Discharge status: Skilled Nursing Facility | Payer: Medicare Other | Attending: Internal Medicine | Admitting: Internal Medicine

## 2021-12-21 ENCOUNTER — Encounter (HOSPITAL_COMMUNITY): Payer: Self-pay | Admitting: Vascular Surgery

## 2021-12-21 ENCOUNTER — Other Ambulatory Visit: Payer: Self-pay

## 2021-12-21 DIAGNOSIS — I469 Cardiac arrest, cause unspecified: Secondary | ICD-10-CM | POA: Diagnosis not present

## 2021-12-21 DIAGNOSIS — E1152 Type 2 diabetes mellitus with diabetic peripheral angiopathy with gangrene: Secondary | ICD-10-CM | POA: Diagnosis not present

## 2021-12-21 DIAGNOSIS — Z833 Family history of diabetes mellitus: Secondary | ICD-10-CM

## 2021-12-21 DIAGNOSIS — E875 Hyperkalemia: Secondary | ICD-10-CM | POA: Diagnosis present

## 2021-12-21 DIAGNOSIS — N186 End stage renal disease: Secondary | ICD-10-CM | POA: Diagnosis not present

## 2021-12-21 DIAGNOSIS — Z89611 Acquired absence of right leg above knee: Secondary | ICD-10-CM | POA: Diagnosis not present

## 2021-12-21 DIAGNOSIS — K219 Gastro-esophageal reflux disease without esophagitis: Secondary | ICD-10-CM | POA: Diagnosis present

## 2021-12-21 DIAGNOSIS — E1140 Type 2 diabetes mellitus with diabetic neuropathy, unspecified: Secondary | ICD-10-CM | POA: Diagnosis not present

## 2021-12-21 DIAGNOSIS — Z7401 Bed confinement status: Secondary | ICD-10-CM | POA: Diagnosis not present

## 2021-12-21 DIAGNOSIS — I70261 Atherosclerosis of native arteries of extremities with gangrene, right leg: Secondary | ICD-10-CM | POA: Diagnosis not present

## 2021-12-21 DIAGNOSIS — B182 Chronic viral hepatitis C: Secondary | ICD-10-CM | POA: Diagnosis present

## 2021-12-21 DIAGNOSIS — I96 Gangrene, not elsewhere classified: Secondary | ICD-10-CM | POA: Diagnosis not present

## 2021-12-21 DIAGNOSIS — R404 Transient alteration of awareness: Secondary | ICD-10-CM | POA: Diagnosis not present

## 2021-12-21 DIAGNOSIS — Z90710 Acquired absence of both cervix and uterus: Secondary | ICD-10-CM | POA: Diagnosis not present

## 2021-12-21 DIAGNOSIS — L97919 Non-pressure chronic ulcer of unspecified part of right lower leg with unspecified severity: Secondary | ICD-10-CM | POA: Diagnosis not present

## 2021-12-21 DIAGNOSIS — Z992 Dependence on renal dialysis: Secondary | ICD-10-CM

## 2021-12-21 DIAGNOSIS — D631 Anemia in chronic kidney disease: Secondary | ICD-10-CM | POA: Diagnosis present

## 2021-12-21 DIAGNOSIS — I150 Renovascular hypertension: Secondary | ICD-10-CM | POA: Diagnosis not present

## 2021-12-21 DIAGNOSIS — E559 Vitamin D deficiency, unspecified: Secondary | ICD-10-CM | POA: Diagnosis present

## 2021-12-21 DIAGNOSIS — Z7982 Long term (current) use of aspirin: Secondary | ICD-10-CM | POA: Diagnosis not present

## 2021-12-21 DIAGNOSIS — E11319 Type 2 diabetes mellitus with unspecified diabetic retinopathy without macular edema: Secondary | ICD-10-CM | POA: Diagnosis present

## 2021-12-21 DIAGNOSIS — Z87891 Personal history of nicotine dependence: Secondary | ICD-10-CM | POA: Diagnosis not present

## 2021-12-21 DIAGNOSIS — Z79899 Other long term (current) drug therapy: Secondary | ICD-10-CM | POA: Diagnosis not present

## 2021-12-21 DIAGNOSIS — N250 Renal osteodystrophy: Secondary | ICD-10-CM | POA: Diagnosis not present

## 2021-12-21 DIAGNOSIS — Z794 Long term (current) use of insulin: Secondary | ICD-10-CM | POA: Diagnosis not present

## 2021-12-21 DIAGNOSIS — Z20822 Contact with and (suspected) exposure to covid-19: Secondary | ICD-10-CM | POA: Diagnosis present

## 2021-12-21 DIAGNOSIS — Z743 Need for continuous supervision: Secondary | ICD-10-CM | POA: Diagnosis not present

## 2021-12-21 DIAGNOSIS — R6889 Other general symptoms and signs: Secondary | ICD-10-CM | POA: Diagnosis not present

## 2021-12-21 DIAGNOSIS — E1122 Type 2 diabetes mellitus with diabetic chronic kidney disease: Secondary | ICD-10-CM

## 2021-12-21 DIAGNOSIS — S91301A Unspecified open wound, right foot, initial encounter: Secondary | ICD-10-CM

## 2021-12-21 DIAGNOSIS — I120 Hypertensive chronic kidney disease with stage 5 chronic kidney disease or end stage renal disease: Secondary | ICD-10-CM | POA: Diagnosis not present

## 2021-12-21 DIAGNOSIS — I10 Essential (primary) hypertension: Secondary | ICD-10-CM | POA: Diagnosis present

## 2021-12-21 DIAGNOSIS — H5461 Unqualified visual loss, right eye, normal vision left eye: Secondary | ICD-10-CM | POA: Diagnosis not present

## 2021-12-21 DIAGNOSIS — E785 Hyperlipidemia, unspecified: Secondary | ICD-10-CM | POA: Diagnosis present

## 2021-12-21 DIAGNOSIS — J449 Chronic obstructive pulmonary disease, unspecified: Secondary | ICD-10-CM | POA: Diagnosis not present

## 2021-12-21 HISTORY — DX: Disorientation, unspecified: R41.0

## 2021-12-21 HISTORY — PX: AMPUTATION: SHX166

## 2021-12-21 HISTORY — DX: Depression, unspecified: F32.A

## 2021-12-21 LAB — POCT I-STAT, CHEM 8
BUN: 72 mg/dL — ABNORMAL HIGH (ref 6–20)
Calcium, Ion: 1.29 mmol/L (ref 1.15–1.40)
Chloride: 104 mmol/L (ref 98–111)
Creatinine, Ser: 9.5 mg/dL — ABNORMAL HIGH (ref 0.44–1.00)
Glucose, Bld: 107 mg/dL — ABNORMAL HIGH (ref 70–99)
HCT: 41 % (ref 36.0–46.0)
Hemoglobin: 13.9 g/dL (ref 12.0–15.0)
Potassium: 5.5 mmol/L — ABNORMAL HIGH (ref 3.5–5.1)
Sodium: 142 mmol/L (ref 135–145)
TCO2: 30 mmol/L (ref 22–32)

## 2021-12-21 LAB — SARS CORONAVIRUS 2 BY RT PCR (HOSPITAL ORDER, PERFORMED IN ~~LOC~~ HOSPITAL LAB): SARS Coronavirus 2: NEGATIVE

## 2021-12-21 LAB — GLUCOSE, CAPILLARY
Glucose-Capillary: 107 mg/dL — ABNORMAL HIGH (ref 70–99)
Glucose-Capillary: 105 mg/dL — ABNORMAL HIGH (ref 70–99)
Glucose-Capillary: 122 mg/dL — ABNORMAL HIGH (ref 70–99)

## 2021-12-21 SURGERY — AMPUTATION, ABOVE KNEE
Anesthesia: General | Site: Leg Upper | Laterality: Right

## 2021-12-21 MED ORDER — ACETAMINOPHEN 10 MG/ML IV SOLN: 1000.0000 mg | Freq: Once | INTRAVENOUS | Status: DC | PRN

## 2021-12-21 MED ORDER — LACTATED RINGERS IV SOLN: INTRAVENOUS | Status: DC

## 2021-12-21 MED ORDER — LIDOCAINE 2% (20 MG/ML) 5 ML SYRINGE
INTRAMUSCULAR | Status: DC | PRN
  Administered 2021-12-21: 40 mg via INTRAVENOUS

## 2021-12-21 MED ORDER — ONDANSETRON HCL 4 MG/2ML IJ SOLN
INTRAMUSCULAR | Status: AC
  Filled 2021-12-21: qty 2

## 2021-12-21 MED ORDER — 0.9 % SODIUM CHLORIDE (POUR BTL) OPTIME
TOPICAL | Status: DC | PRN
Start: 2021-12-21 — End: 2021-12-21
  Administered 2021-12-21: 1000 mL

## 2021-12-21 MED ORDER — FENTANYL CITRATE (PF) 250 MCG/5ML IJ SOLN
INTRAMUSCULAR | Status: DC | PRN
  Administered 2021-12-21: 50 ug via INTRAVENOUS

## 2021-12-21 MED ORDER — DEXAMETHASONE SODIUM PHOSPHATE 10 MG/ML IJ SOLN
INTRAMUSCULAR | Status: DC | PRN
Start: 2021-12-21 — End: 2021-12-21
  Administered 2021-12-21: 5 mg via INTRAVENOUS

## 2021-12-21 MED ORDER — MIDAZOLAM HCL 2 MG/2ML IJ SOLN
INTRAMUSCULAR | Status: AC
  Filled 2021-12-21: qty 2

## 2021-12-21 MED ORDER — FENTANYL CITRATE (PF) 250 MCG/5ML IJ SOLN
INTRAMUSCULAR | Status: AC
  Filled 2021-12-21: qty 5

## 2021-12-21 MED ORDER — ALTEPLASE 2 MG IJ SOLR
2.0000 mg | Freq: Once | INTRAMUSCULAR | Status: DC | PRN
Start: 2021-12-21 — End: 2021-12-26

## 2021-12-21 MED ORDER — HYDROMORPHONE HCL 1 MG/ML IJ SOLN
0.2500 mg | INTRAMUSCULAR | Status: DC | PRN
  Administered 2021-12-21 (×2): 0.25 mg via INTRAVENOUS

## 2021-12-21 MED ORDER — PHENYLEPHRINE 40 MCG/ML (10ML) SYRINGE FOR IV PUSH (FOR BLOOD PRESSURE SUPPORT)
PREFILLED_SYRINGE | INTRAVENOUS | Status: AC
  Filled 2021-12-21: qty 10

## 2021-12-21 MED ORDER — SODIUM CHLORIDE 0.9 % IV SOLN
100.0000 mL | INTRAVENOUS | Status: DC | PRN
Start: 2021-12-21 — End: 2021-12-26

## 2021-12-21 MED ORDER — ACETAMINOPHEN 325 MG PO TABS
650.0000 mg | ORAL_TABLET | Freq: Four times a day (QID) | ORAL | Status: DC | PRN
  Administered 2021-12-22 – 2021-12-24 (×2): 650 mg via ORAL
  Filled 2021-12-21 (×3): qty 2

## 2021-12-21 MED ORDER — INSULIN ASPART 100 UNIT/ML IJ SOLN: 0.0000 [IU] | INTRAMUSCULAR | Status: DC | PRN

## 2021-12-21 MED ORDER — CHLORHEXIDINE GLUCONATE 0.12 % MT SOLN
15.0000 mL | Freq: Once | OROMUCOSAL | Status: AC
  Administered 2021-12-21: 15 mL via OROMUCOSAL
  Filled 2021-12-21: qty 15

## 2021-12-21 MED ORDER — HYDROMORPHONE HCL 1 MG/ML IJ SOLN
0.5000 mg | INTRAMUSCULAR | Status: DC | PRN
  Administered 2021-12-21 – 2021-12-25 (×3): 0.5 mg via INTRAVENOUS
  Filled 2021-12-21: qty 0.5
  Filled 2021-12-21: qty 1
  Filled 2021-12-21: qty 0.5
  Filled 2021-12-21: qty 1

## 2021-12-21 MED ORDER — SUGAMMADEX SODIUM 200 MG/2ML IV SOLN
INTRAVENOUS | Status: DC | PRN
  Administered 2021-12-21: 200 mg via INTRAVENOUS

## 2021-12-21 MED ORDER — CHLORHEXIDINE GLUCONATE CLOTH 2 % EX PADS
6.0000 | MEDICATED_PAD | Freq: Every day | CUTANEOUS | Status: DC
  Administered 2021-12-22 – 2021-12-26 (×5): 6 via TOPICAL

## 2021-12-21 MED ORDER — AMISULPRIDE (ANTIEMETIC) 5 MG/2ML IV SOLN: 10.0000 mg | Freq: Once | INTRAVENOUS | Status: DC | PRN

## 2021-12-21 MED ORDER — ROCURONIUM BROMIDE 10 MG/ML (PF) SYRINGE
PREFILLED_SYRINGE | INTRAVENOUS | Status: DC | PRN
Start: 2021-12-21 — End: 2021-12-21
  Administered 2021-12-21: 40 mg via INTRAVENOUS

## 2021-12-21 MED ORDER — FENTANYL CITRATE (PF) 100 MCG/2ML IJ SOLN
INTRAMUSCULAR | Status: AC
  Filled 2021-12-21: qty 2

## 2021-12-21 MED ORDER — PHENYLEPHRINE HCL (PRESSORS) 10 MG/ML IV SOLN
INTRAVENOUS | Status: AC
  Filled 2021-12-21: qty 1

## 2021-12-21 MED ORDER — ORAL CARE MOUTH RINSE: 15.0000 mL | Freq: Once | OROMUCOSAL | Status: AC

## 2021-12-21 MED ORDER — MEPERIDINE HCL 25 MG/ML IJ SOLN: 6.2500 mg | INTRAMUSCULAR | Status: DC | PRN

## 2021-12-21 MED ORDER — SODIUM CHLORIDE 0.9 % IV SOLN: 100.0000 mL | INTRAVENOUS | Status: DC | PRN

## 2021-12-21 MED ORDER — LIDOCAINE-PRILOCAINE 2.5-2.5 % EX CREA
1.0000 "application " | TOPICAL_CREAM | CUTANEOUS | Status: DC | PRN
  Filled 2021-12-21: qty 5

## 2021-12-21 MED ORDER — SODIUM CHLORIDE 0.9 % IV SOLN: INTRAVENOUS | Status: DC

## 2021-12-21 MED ORDER — PENTAFLUOROPROP-TETRAFLUOROETH EX AERO: 1.0000 "application " | INHALATION_SPRAY | CUTANEOUS | Status: DC | PRN

## 2021-12-21 MED ORDER — OXYCODONE-ACETAMINOPHEN 5-325 MG PO TABS
1.0000 | ORAL_TABLET | ORAL | Status: DC | PRN
  Administered 2021-12-21 – 2021-12-24 (×7): 2 via ORAL
  Administered 2021-12-25: 1 via ORAL
  Administered 2021-12-25 – 2021-12-26 (×2): 2 via ORAL
  Administered 2021-12-26: 1 via ORAL
  Filled 2021-12-21 (×7): qty 2
  Filled 2021-12-21: qty 1
  Filled 2021-12-21 (×3): qty 2

## 2021-12-21 MED ORDER — ATORVASTATIN CALCIUM 80 MG PO TABS
80.0000 mg | ORAL_TABLET | Freq: Every day | ORAL | Status: DC
  Administered 2021-12-22 – 2021-12-26 (×5): 80 mg via ORAL
  Filled 2021-12-21 (×5): qty 1

## 2021-12-21 MED ORDER — CHLORHEXIDINE GLUCONATE CLOTH 2 % EX PADS
6.0000 | MEDICATED_PAD | Freq: Once | CUTANEOUS | Status: DC
Start: 2021-12-21 — End: 2021-12-21

## 2021-12-21 MED ORDER — ACETAMINOPHEN 325 MG PO TABS: 325.0000 mg | ORAL_TABLET | Freq: Once | ORAL | Status: DC | PRN

## 2021-12-21 MED ORDER — CHLORHEXIDINE GLUCONATE CLOTH 2 % EX PADS: 6.0000 | MEDICATED_PAD | Freq: Once | CUTANEOUS | Status: DC

## 2021-12-21 MED ORDER — ROCURONIUM BROMIDE 10 MG/ML (PF) SYRINGE
PREFILLED_SYRINGE | INTRAVENOUS | Status: AC
  Filled 2021-12-21: qty 10

## 2021-12-21 MED ORDER — ACETAMINOPHEN 160 MG/5ML PO SOLN: 325.0000 mg | Freq: Once | ORAL | Status: DC | PRN

## 2021-12-21 MED ORDER — PHENYLEPHRINE 40 MCG/ML (10ML) SYRINGE FOR IV PUSH (FOR BLOOD PRESSURE SUPPORT)
PREFILLED_SYRINGE | INTRAVENOUS | Status: DC | PRN
  Administered 2021-12-21: 80 ug via INTRAVENOUS
  Administered 2021-12-21: 120 ug via INTRAVENOUS
  Administered 2021-12-21: 80 ug via INTRAVENOUS

## 2021-12-21 MED ORDER — ONDANSETRON HCL 4 MG/2ML IJ SOLN
INTRAMUSCULAR | Status: DC | PRN
  Administered 2021-12-21: 4 mg via INTRAVENOUS

## 2021-12-21 MED ORDER — FENTANYL CITRATE (PF) 100 MCG/2ML IJ SOLN
25.0000 ug | Freq: Once | INTRAMUSCULAR | Status: AC
  Administered 2021-12-21: 25 ug via INTRAVENOUS

## 2021-12-21 MED ORDER — HEPARIN SODIUM (PORCINE) 1000 UNIT/ML DIALYSIS
1000.0000 [IU] | INTRAMUSCULAR | Status: DC | PRN
  Filled 2021-12-21: qty 1

## 2021-12-21 MED ORDER — PROPOFOL 10 MG/ML IV BOLUS
INTRAVENOUS | Status: DC | PRN
  Administered 2021-12-21: 90 mg via INTRAVENOUS

## 2021-12-21 MED ORDER — PROPOFOL 10 MG/ML IV BOLUS
INTRAVENOUS | Status: AC
  Filled 2021-12-21: qty 20

## 2021-12-21 MED ORDER — LIDOCAINE HCL (PF) 1 % IJ SOLN: 5.0000 mL | INTRAMUSCULAR | Status: DC | PRN

## 2021-12-21 MED ORDER — DEXAMETHASONE SODIUM PHOSPHATE 10 MG/ML IJ SOLN
INTRAMUSCULAR | Status: AC
  Filled 2021-12-21: qty 1

## 2021-12-21 MED ORDER — PHENYLEPHRINE HCL-NACL 20-0.9 MG/250ML-% IV SOLN
INTRAVENOUS | Status: DC | PRN
  Administered 2021-12-21: 20 ug/min via INTRAVENOUS

## 2021-12-21 MED ORDER — CEFAZOLIN SODIUM-DEXTROSE 2-4 GM/100ML-% IV SOLN
2.0000 g | INTRAVENOUS | Status: AC
  Administered 2021-12-21: 2 g via INTRAVENOUS
  Filled 2021-12-21: qty 100

## 2021-12-21 MED ORDER — HYDROMORPHONE HCL 1 MG/ML IJ SOLN
INTRAMUSCULAR | Status: AC
  Filled 2021-12-21: qty 1

## 2021-12-21 MED ORDER — SODIUM ZIRCONIUM CYCLOSILICATE 10 G PO PACK
10.0000 g | PACK | Freq: Once | ORAL | Status: AC
Start: 2021-12-21 — End: 2021-12-21
  Administered 2021-12-21: 10 g via ORAL
  Filled 2021-12-21 (×2): qty 1

## 2021-12-21 MED ORDER — LIDOCAINE 2% (20 MG/ML) 5 ML SYRINGE
INTRAMUSCULAR | Status: AC
  Filled 2021-12-21: qty 5

## 2021-12-21 SURGICAL SUPPLY — 54 items
BAG COUNTER SPONGE SURGICOUNT (BAG) ×2 IMPLANT
BAG SPNG CNTER NS LX DISP (BAG) ×1
BANDAGE ESMARK 6X9 LF (GAUZE/BANDAGES/DRESSINGS) ×1 IMPLANT
BLADE SAW SAG 73X25 THK (BLADE) ×1
BLADE SAW SGTL 73X25 THK (BLADE) ×1 IMPLANT
BNDG COHESIVE 6X5 TAN STRL LF (GAUZE/BANDAGES/DRESSINGS) ×2 IMPLANT
BNDG ELASTIC 4X5.8 VLCR NS LF (GAUZE/BANDAGES/DRESSINGS) ×1 IMPLANT
BNDG ELASTIC 4X5.8 VLCR STR LF (GAUZE/BANDAGES/DRESSINGS) ×2 IMPLANT
BNDG ELASTIC 6X5.8 VLCR STR LF (GAUZE/BANDAGES/DRESSINGS) ×2 IMPLANT
BNDG ESMARK 6X9 LF (GAUZE/BANDAGES/DRESSINGS) ×2
BNDG GAUZE ELAST 4 BULKY (GAUZE/BANDAGES/DRESSINGS) ×4 IMPLANT
CANISTER SUCT 3000ML PPV (MISCELLANEOUS) ×2 IMPLANT
CLIP VESOCCLUDE MED 6/CT (CLIP) ×2 IMPLANT
COVER BACK TABLE 60X90IN (DRAPES) ×2 IMPLANT
COVER SURGICAL LIGHT HANDLE (MISCELLANEOUS) ×4 IMPLANT
DRAIN CHANNEL 19F RND (DRAIN) IMPLANT
DRAPE DERMATAC (DRAPES) IMPLANT
DRAPE HALF SHEET 40X57 (DRAPES) ×2 IMPLANT
DRAPE INCISE IOBAN 66X45 STRL (DRAPES) IMPLANT
DRAPE ORTHO SPLIT 77X108 STRL (DRAPES) ×4
DRAPE SURG ORHT 6 SPLT 77X108 (DRAPES) ×2 IMPLANT
DRAPE U-SHAPE 47X51 STRL (DRAPES) IMPLANT
DRESSING PREVENA PLUS CUSTOM (GAUZE/BANDAGES/DRESSINGS) IMPLANT
DRSG ADAPTIC 3X8 NADH LF (GAUZE/BANDAGES/DRESSINGS) ×2 IMPLANT
DRSG PREVENA PLUS CUSTOM (GAUZE/BANDAGES/DRESSINGS)
ELECT CAUTERY BLADE 6.4 (BLADE) ×2 IMPLANT
ELECT REM PT RETURN 9FT ADLT (ELECTROSURGICAL) ×2
ELECTRODE REM PT RTRN 9FT ADLT (ELECTROSURGICAL) ×1 IMPLANT
EVACUATOR SILICONE 100CC (DRAIN) IMPLANT
GAUZE SPONGE 4X4 12PLY STRL (GAUZE/BANDAGES/DRESSINGS) ×4 IMPLANT
GAUZE SPONGE 4X4 12PLY STRL LF (GAUZE/BANDAGES/DRESSINGS) ×2 IMPLANT
GLOVE SRG 8 PF TXTR STRL LF DI (GLOVE) ×1 IMPLANT
GLOVE SURG ENC MOIS LTX SZ7.5 (GLOVE) ×2 IMPLANT
GLOVE SURG UNDER POLY LF SZ8 (GLOVE) ×2
GOWN STRL REUS W/ TWL XL LVL3 (GOWN DISPOSABLE) ×1 IMPLANT
GOWN STRL REUS W/TWL XL LVL3 (GOWN DISPOSABLE) ×2
KIT BASIN OR (CUSTOM PROCEDURE TRAY) ×2 IMPLANT
KIT TURNOVER KIT B (KITS) ×2 IMPLANT
NS IRRIG 1000ML POUR BTL (IV SOLUTION) ×2 IMPLANT
PACK GENERAL/GYN (CUSTOM PROCEDURE TRAY) ×2 IMPLANT
PAD ARMBOARD 7.5X6 YLW CONV (MISCELLANEOUS) ×4 IMPLANT
PREVENA RESTOR ARTHOFORM 46X30 (CANNISTER) IMPLANT
STAPLER VISISTAT 35W (STAPLE) ×2 IMPLANT
STOCKINETTE IMPERVIOUS LG (DRAPES) ×2 IMPLANT
SUT ETHILON 3 0 PS 1 (SUTURE) IMPLANT
SUT SILK 0 TIES 10X30 (SUTURE) ×1 IMPLANT
SUT SILK 2 0 (SUTURE) ×2
SUT SILK 2 0 SH CR/8 (SUTURE) ×2 IMPLANT
SUT SILK 2-0 18XBRD TIE 12 (SUTURE) ×1 IMPLANT
SUT VIC AB 2-0 CT1 18 (SUTURE) ×4 IMPLANT
SUT VIC AB 3-0 SH 18 (SUTURE) ×1 IMPLANT
TOWEL GREEN STERILE (TOWEL DISPOSABLE) ×4 IMPLANT
UNDERPAD 30X36 HEAVY ABSORB (UNDERPADS AND DIAPERS) ×2 IMPLANT
WATER STERILE IRR 1000ML POUR (IV SOLUTION) ×2 IMPLANT

## 2021-12-21 NOTE — H&P (Addendum)
Date: 12/21/2021               Patient Name:  Tammy Moses MRN: [id number]  DOB: 31-May-1966 Age / Sex: 56 y.o., female   PCP: [name], [place]         Medical Service: Internal Medicine Teaching Service         Attending Physician: Dr. Lucious Groves, DO    First Contact: Farrel Gordon, DO Pager: ED [phone]  Second Contact: Sanjuana Letters, DO Pager: Maryjean Morn [phone]       After Hours (After 5p/  First Contact Pager: [phone]  weekends / holidays): Second Contact Pager: [phone]   SUBJECTIVE   Chief Complaint: R foot gangrene  History of Present Illness: [name] is a 56 y.o. female with PMH ESRD on HD MWF, T2DM, HTN, HLD who presented for R AKA after failing conservative measures admitted for post-op management of chronic medical conditions.  She is drowsy still and complains only of increased pressure and discomfort at her R AKA stump. She is requesting that it be removed.   History obtained via chart review. Patient was found to have severe infrainguinal occlusive disease bilaterally and underwent R iliofemoral endarterectomy with vein patch angioplasty and R great toe amputation including metatarsal head. She had a failed angioplasty of R SFA and popliteal artery on 01/06; on 01/21 she had further operation for R groin wound dehiscence and evacuation of lymphocele.  At time of procedure it was felt that she had no further options for revascularization and agreed to proceed with R AKA.   ED Course: S/p R AKA today.  Meds:  Current Meds  Medication Sig   acetaminophen (TYLENOL) 325 MG tablet Take 2 tablets (650 mg total) by mouth every 4 (four) hours as needed for headache or mild pain.   albuterol (VENTOLIN HFA) 108 (90 Base) MCG/ACT inhaler Inhale 2 puffs into the lungs every 4 (four) hours as needed for shortness of breath. For shortness of breath (Patient taking differently: Inhale 2 puffs into the lungs every 4 (four) hours as needed for shortness of breath or  wheezing.)   amLODipine (NORVASC) 10 MG tablet Take 1 tablet (10 mg total) by mouth daily.   aspirin EC 81 MG EC tablet Take 1 tablet (81 mg total) by mouth daily. Swallow whole.   atorvastatin (LIPITOR) 80 MG tablet Take 1 tablet (80 mg total) by mouth daily.   atropine 1 % ophthalmic solution Place 1 drop into both eyes 2 (two) times daily. (0900 & 2100)   buPROPion (WELLBUTRIN SR) 150 MG 12 hr tablet Take 1 tablet (150 mg total) by mouth daily.   dorzolamide-timolol (COSOPT) 22.3-6.8 MG/ML ophthalmic solution Place 1 drop into both eyes 2 (two) times daily.   furosemide (LASIX) 40 MG tablet Take 40 mg by mouth 2 (two) times daily. (0800 & 2000)   hydrALAZINE (APRESOLINE) 100 MG tablet Take 1 tablet (100 mg total) by mouth every 8 (eight) hours. (Patient taking differently: Take 100 mg by mouth 3 (three) times daily. (0800, 1400 & 2200))   HYDROmorphone (DILAUDID) 2 MG tablet Take 0.5 tablets (1 mg total) by mouth every 4 (four) hours as needed for severe pain.   insulin lispro (HUMALOG) 100 UNIT/ML injection Inject into the skin 3 (three) times daily before meals. Inject as per sliding scale 150-200=2 units 201-250=4 units 251-300=6 units 301-350=8 units 351-400=10 units   labetalol (NORMODYNE) 200 MG tablet Take 1 tablet (200 mg total) by mouth  2 (two) times daily.   latanoprost (XALATAN) 0.005 % ophthalmic solution Place 1 drop into both eyes at bedtime. (2100)   losartan (COZAAR) 100 MG tablet Take 1 tablet (100 mg total) by mouth daily.   metoCLOPramide (REGLAN) 10 MG tablet Take 10 mg by mouth in the morning and at bedtime. (0800 & 2000)   mirtazapine (REMERON) 7.5 MG tablet Take 7.5 mg by mouth at bedtime. (2100)   multivitamin (RENA-VIT) TABS tablet Take 1 tablet by mouth at bedtime. (Patient taking differently: Take 1 tablet by mouth daily at 8 pm. (2000))   pregabalin (LYRICA) 25 MG capsule Take 1 capsule (25 mg total) by mouth at bedtime.   QUEtiapine (SEROQUEL) 25 MG tablet Take  1 tablet (25 mg total) by mouth at bedtime.   senna-docusate (SENOKOT-S) 8.6-50 MG tablet Take 1 tablet by mouth at bedtime as needed for mild constipation. (Patient taking differently: Take 1 tablet by mouth daily as needed for mild constipation.)   sevelamer carbonate (RENVELA) 800 MG tablet Take 800-2,400 mg by mouth See admin instructions. Take 3 tablets (2400 mg) by mouth 3 time daily with meals (0800, 1300 & 1700) + Take 1 tablet (800 mg) by mouth as needed for ESRD with snacks.    Past Medical History:  Diagnosis Date   Anemia    Anxiety    Blind right eye    Chronic kidney disease, stage V (HCC)    Confusion    at times   Constipation, chronic    Dental caries    Depression    Diabetes mellitus    Diabetic neuropathy (HCC)    Diabetic retinopathy    GERD (gastroesophageal reflux disease)    Glaucoma    H. pylori infection    Hepatitis C carrier ([place])    High risk sexual behavior    Hyperlipidemia    Hypertension    Hypoglycemia 07/12/2017   Insomnia    Microalbuminuria    Nonspecific elevation of levels of transaminase or lactic acid dehydrogenase (LDH)    Tobacco dependence    Vitamin D deficiency     Past Surgical History:  Procedure Laterality Date   ABDOMINAL AORTOGRAM W/LOWER EXTREMITY N/A 10/27/2021   Procedure: ABDOMINAL AORTOGRAM W/LOWER EXTREMITY;  Surgeon: Serafina Mitchell, MD;  Location: [facility];  Service: Cardiovascular;  Laterality: N/A;   ABDOMINAL HYSTERECTOMY  04/30/2009   PARTIAL   AMPUTATION Right 10/30/2021   Procedure: RIGHT GREAT TOE AMPUTATION;  Surgeon: Serafina Mitchell, MD;  Location: [facility];  Service: Vascular;  Laterality: Right;   COLONOSCOPY     DIALYSIS FISTULA CREATION Left 06/2017   ENDARTERECTOMY FEMORAL Right 10/30/2021   Procedure: RIGHT FEMORAL ENDARTERECTOMY WITH ANGIOPLASTY OF TIBIAL ARTERY;  Surgeon: Serafina Mitchell, MD;  Location: [facility];  Service: Vascular;  Laterality: Right;   ESOPHAGOGASTRODUODENOSCOPY     GROIN  DEBRIDEMENT Right 11/14/2021   Procedure: Wound exploration and closure.;  Surgeon: Angelia Mould, MD;  Location: [facility];  Service: Vascular;  Laterality: Right;   IR AV DIALY SHUNT INTRO NEEDLE/INTRACATH INITIAL W/PTA/IMG RIGHT Right 10/07/2021   IR FLUORO GUIDE CV LINE RIGHT  10/06/2021   IR US GUIDE VASC ACCESS RIGHT  10/06/2021   IR US GUIDE VASC ACCESS RIGHT  10/08/2021    Social:  Lives With:SNF Support:Family, nearby PCP: Kathe Becton FNP Substances: Smoked for 20 years and recently quit cold [place].  Family History: Unable to obtain at this time.  Allergies: Allergies as of 12/15/2021 -  Review Complete 12/15/2021  Allergen Reaction Noted   Acyclovir and related Itching 06/13/2018    Review of Systems: A complete ROS was negative except as per HPI.   OBJECTIVE:   Physical Exam: Blood pressure (!) 141/54, pulse 65, temperature 98.2 F (36.8 C), resp. rate 16, height 5\' 1"  (1.549 m), weight 52.2 kg, SpO2 95 %.  Constitutional:Chronically-ill appearing female. Cardio:Regular rate and rhythm. Bruit auscultated over AV fistula. Pulm:Normal work of breathing on room air. Abdomen:Soft, nontender, nondistended. JXB:JYNWGNFAO bulk and tone of LLE; new R AKA with pressure bandage over stump. Skin:Distal extremities are cool and dry. Neuro:Drowsy. Psych:Normal mood and affect.  Labs: CBC    Component Value Date/Time   WBC 12.7 (H) 11/17/2021 0210   RBC 3.54 (L) 11/17/2021 0210   HGB 13.9 12/21/2021 0924   HGB 12.4 12/13/2017 1416   HCT 41.0 12/21/2021 0924   HCT 37.8 12/13/2017 1416   PLT 509 (H) 11/17/2021 0210   PLT 254 12/13/2017 1416   MCV 89.8 11/17/2021 0210   MCV 83 12/13/2017 1416   MCH 28.0 11/17/2021 0210   MCHC 31.1 11/17/2021 0210   RDW 17.1 (H) 11/17/2021 0210   RDW 15.8 (H) 12/13/2017 1416   LYMPHSABS 1.5 11/13/2021 2118   LYMPHSABS 4.1 (H) 12/13/2017 1416   MONOABS 1.2 (H) 11/13/2021 2118   EOSABS 0.3 11/13/2021 2118   EOSABS 0.3  12/13/2017 1416   BASOSABS 0.1 11/13/2021 2118   BASOSABS 0.1 12/13/2017 1416     CMP     Component Value Date/Time   NA 142 12/21/2021 0924   NA 146 (H) 05/10/2017 0950   K 5.5 (H) 12/21/2021 0924   CL 104 12/21/2021 0924   CO2 28 11/17/2021 0210   GLUCOSE 107 (H) 12/21/2021 0924   BUN 72 (H) 12/21/2021 0924   BUN 38 (H) 05/10/2017 0950   CREATININE 9.50 (H) 12/21/2021 0924   CREATININE 3.40 (H) 03/11/2017 1505   CALCIUM 9.7 11/17/2021 0210   PROT 6.9 10/25/2021 0200   PROT 6.9 05/10/2017 0950   ALBUMIN 2.9 (L) 11/06/2021 0914   ALBUMIN 3.9 05/10/2017 0950   AST 17 10/25/2021 0200   ALT 15 10/25/2021 0200   ALKPHOS 67 10/25/2021 0200   BILITOT 0.5 10/25/2021 0200   BILITOT <0.2 05/10/2017 0950   GFRNONAA 8 (L) 11/17/2021 0210   GFRNONAA 15 (L) 03/11/2017 1505   GFRAA 5 (L) 05/05/2020 1314   GFRAA 17 (L) 03/11/2017 1505    Imaging: No results found.  EKG: None obtained.   ASSESSMENT & PLAN:    Assessment & Plan by Problem: Principal Problem:   Gangrene of foot ([place])   Ms. Enzo Montgomery Nickey is a 56 y.o. female with PMH ESRD on HD MWF, T2DM, HTN, HLD who presented for R AKA after failing conservative measures admitted for post-op management of chronic medical conditions.on hospital day 0.  #R BKA S/p R AKA 13/08 without complications.  -Management per [facility]  #ESRD on HD MWF Patient states she had full HD session yesterday which is different from her regular schedule. Admission labs remarkable for hyperkalemia at 5.5, increased BUN/Cr to 72/9.50 (baseline appears to be 20s-40s/6-8). Likely that patient's mental status is 2/2 anesthesia effects and pain control, but given significantly elevated BUN from apparent baseline, cannot exclude uremia. -Nephrology consulted, appreciate their assistance -Lokelma 10 g PO x 1 dose -Stop IVF  #HTN Normotensive at this time. Home regimen includes amlodipine 10 mg daily, lasix 40 mg BID, hydralazine 100 mg TID, labetalol  200 mg  BID, losartan 100 mg daily. -Hold at this time  #Type 2 diabetes mellitus HbA1c 5.5% in January 2023. Chronically managed with humalog SSI. -novoLOG SSI  #HLD Chronically managed with atorvastatin 80 mg daily. -Continue atorvastatin 80 mg daily  Diet:  Renal/carb modified with 1200 mL fluid restriction VTE: SCDs IVF: None Code: Full  Prior to Admission Living Arrangement: SNF Anticipated Discharge Location: SNF, will consult TOC for new SNF requests per family Barriers to Discharge: Medical stability, SNF placement  Dispo: Admit patient to Inpatient with expected length of stay greater than 2 midnights.  Signed: Farrel Gordon, DO Internal Medicine Resident PGY-2 Pager: [phone]  12/21/2021, 4:31 PM

## 2021-12-21 NOTE — H&P (Signed)
History and Physical Interval Note:  12/21/2021 12:54 PM  Tammy Moses  has presented today for surgery, with the diagnosis of Nonhealing wound, gangrene right foot.  The various methods of treatment have been discussed with the patient and family. After consideration of risks, benefits and other options for treatment, the patient has consented to  Procedure(s): RIGHT ABOVE KNEE AMPUTATION (Right) as a surgical intervention.  The patient's history has been reviewed, patient examined, no change in status, stable for surgery.  I have reviewed the patient's chart and labs.  Questions were answered to the patient's satisfaction.    Discussed plan for right above-knee amputation.  She has gangrene of Tammy right foot with no further options for revascularization.  Patient states she is in agreement.  Risk benefits discussed.  I have also discussed with [name] who agrees she needs above-knee amputation.  [name]  POST OPERATIVE OFFICE NOTE       CC:  F/u for surgery   HPI:     Tammy Moses who was seen in the hospital in December 2022 for right great toe gangrene.  She underwent aortogram on 10/27/2021 by 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  She subsequently underwent right iliofemoral endarterectomy with vein patch angioplasty and right great toe amputation including metatarsal head, failed angioplasty right SFA and popliteal artery on 10/30/2021 by Dr. [name] 11/14/2021, she was taken back to the OR for dehiscence of the right groin wound for evacuation of lymphocele and wound vac was placed by Dr. [name].   At discharge, she was scheduled for M/W/F wound vac change.  She was having poor healing of Tammy GT amputation site.  She was scheduled for 2-3 week follow up for groin check and amp site check.  It is felt that if Tammy amputation site is not healing, she will need a more proximal amputation.     Pt returns today for follow up  and here with Tammy Moses and Moses.  Pt states she has continued pain in the right foot.  Tammy Moses is here with Tammy and has concerns about Tammy facility.  She states the pt has fallen and Tammy foot has gotten caught up in Tammy wheelchair several times.   Tammy Moses states that the pt does not walk and she does not feel that she will walk again.     Pt has hx of ESRD on HD M/W/F, HTN, DM, COPD.  She is currently residing in SNF.       Allergies  Allergen Reactions   Acyclovir And Related Itching            Current Outpatient Medications  Medication Sig Dispense Refill   acetaminophen (TYLENOL) 325 MG tablet Take 2 tablets (650 mg total) by mouth every 4 (four) hours as needed for headache or mild pain.       albuterol (VENTOLIN HFA) 108 (90 Base) MCG/ACT inhaler Inhale 2 puffs into the lungs every 4 (four) hours as needed for shortness of breath. For shortness of breath (Patient taking differently: Inhale 2 puffs into the lungs every 4 (four) hours as needed for shortness of breath or wheezing.) 8 g 11   amLODipine (NORVASC) 10 MG tablet Take 1 tablet (10 mg total) by mouth daily.       aspirin EC 81 MG EC tablet Take 1 tablet (81 mg total) by mouth daily. Swallow whole. 30 tablet 11   atorvastatin (LIPITOR) 80 MG tablet  Take 1 tablet (80 mg total) by mouth daily.       atropine 1 % ophthalmic solution Place 1 drop into both eyes 2 (two) times daily.       buPROPion (WELLBUTRIN SR) 150 MG 12 hr tablet Take 1 tablet (150 mg total) by mouth daily.       calcium acetate (PHOSLO) 667 MG capsule Take 3 capsules (2,001 mg total) by mouth 3 (three) times daily with meals.       dorzolamide-timolol (COSOPT) 22.3-6.8 MG/ML ophthalmic solution Place 1 drop into both eyes 2 (two) times daily.       furosemide (LASIX) 40 MG tablet Take 40 mg by mouth 2 (two) times daily. (Patient not taking: Reported on 11/14/2021)       hydrALAZINE (APRESOLINE) 100 MG tablet Take 1 tablet (100 mg total) by mouth every 8  (eight) hours. (Patient taking differently: Take 100 mg by mouth 3 (three) times daily.)       HYDROmorphone (DILAUDID) 2 MG tablet Take 0.5 tablets (1 mg total) by mouth every 4 (four) hours as needed for severe pain. 30 tablet 0   insulin lispro (HUMALOG) 100 UNIT/ML injection Inject into the skin 3 (three) times daily before meals. Inject as per sliding scale 150-200=2 units 201-250=4 units 251-300=6 units 301-350=8 units 351-400=10 units       labetalol (NORMODYNE) 200 MG tablet Take 1 tablet (200 mg total) by mouth 2 (two) times daily.       lamoTRIgine (LAMICTAL) 25 MG tablet Take 25 mg by mouth daily.       latanoprost (XALATAN) 0.005 % ophthalmic solution Place 1 drop into both eyes at bedtime.       losartan (COZAAR) 100 MG tablet Take 1 tablet (100 mg total) by mouth daily.       metoCLOPramide (REGLAN) 10 MG tablet Take 10 mg by mouth in the morning and at bedtime.       Multiple Vitamins-Minerals (MULTIVITAMIN WITH MINERALS) tablet Take 1 tablet by mouth daily.       multivitamin (RENA-VIT) TABS tablet Take 1 tablet by mouth at bedtime. (Patient taking differently: Take 1 tablet by mouth daily.)   0   pregabalin (LYRICA) 25 MG capsule Take 1 capsule (25 mg total) by mouth at bedtime. (Patient taking differently: Take 25 mg by mouth daily.) 30 capsule 0   QUEtiapine (SEROQUEL) 25 MG tablet Take 1 tablet (25 mg total) by mouth at bedtime. 30 tablet 0   senna-docusate (SENOKOT-S) 8.6-50 MG tablet Take 1 tablet by mouth at bedtime as needed for mild constipation. (Patient taking differently: Take 1 tablet by mouth daily as needed for mild constipation.)        No current facility-administered medications for this visit.       ROS:  See HPI   Physical Exam:      Today's Vitals    12/15/21 0831  BP: (!) 148/61  Pulse: 79  Resp: 20  Temp: 98.1 F (36.7 C)  TempSrc: Temporal  SpO2: 100%  Height: 5' (1.524 m)  PainSc: 9   PainLoc: Foot    Body mass index is 20.88 kg/m.      Incision:   Right groin is healing despite wound vac not being on Right groin incision   Extremities:  + doppler signal right AT but no signal onto the foot.  The right great toe amputation has not healed and she has gangrenous changes to the foot  Assessment/Plan:  This is a Tammy y.o. Moses who is s/p: right iliofemoral endarterectomy with vein patch angioplasty and right great toe amputation including metatarsal head, failed angioplasty right SFA and popliteal artery on 10/30/2021 by Dr. [name] 11/14/2021, she was taken back to the OR for dehiscence of the right groin wound for evacuation of lymphocele and wound vac was placed.    -pt chart reviewed with Dr. Carlis Abbott and angiogram reviewed.  Given pt does not walk and has a low chance of healing a below knee amputation, will plan for a right above knee amputation by either Dr. Carlis Abbott or [name] on a non dialysis day as she does not have any further revascularization options.  When discussing with pt, she has good understanding that Tammy leg with be amputated above the knee.  -she will need admission to the hosptialist service for management of Tammy ESRD, DM, HTN, COPD. -during admission, they would like to speak with social worker about possible new facilities when she discharges from the hospital as they are not satisfied with current location.     Leontine Locket, [facility] [phone]

## 2021-12-21 NOTE — Progress Notes (Signed)
Brief nephrology note: Repeat potassium level 6.4. She received Lokelma earlier today. Will plan for HD tonight. D/w HD nurse Mel Almond.   Dr. Carolin Sicks.

## 2021-12-21 NOTE — Anesthesia Postprocedure Evaluation (Signed)
Anesthesia Post Note  Patient: MIYANI CRONIC  Procedure(s) Performed: RIGHT ABOVE KNEE AMPUTATION (Right: Leg Upper)     Patient location during evaluation: PACU Anesthesia Type: General Level of consciousness: awake and alert Pain management: pain level controlled Vital Signs Assessment: post-procedure vital signs reviewed and stable Respiratory status: spontaneous breathing, nonlabored ventilation, respiratory function stable and patient connected to nasal cannula oxygen Cardiovascular status: blood pressure returned to baseline and stable Postop Assessment: no apparent nausea or vomiting Anesthetic complications: no   No notable events documented.  Last Vitals:  Vitals:   12/21/21 1704 12/21/21 1751  BP: (!) 173/55 (!) 169/56  Pulse: 69 68  Resp: 19 12  Temp: (!) 36.2 C   SpO2: 97% 98%    Last Pain:  Vitals:   12/21/21 1759  TempSrc:   PainSc: Asleep                 Effie Berkshire

## 2021-12-21 NOTE — Progress Notes (Signed)
Pt arrived to rm 14 from PACU. CHG wipe given. Initiated tele. Vss. Oriented pt to the unit. Call bell within reach.   Lavenia Atlas, RN

## 2021-12-21 NOTE — Transfer of Care (Signed)
Immediate Anesthesia Transfer of Care Note  Patient: Tammy Moses  Procedure(s) Performed: RIGHT ABOVE KNEE AMPUTATION (Right: Leg Upper)  Patient Location: PACU  Anesthesia Type:General  Level of Consciousness: drowsy and patient cooperative  Airway & Oxygen Therapy: Patient Spontanous Breathing  Post-op Assessment: Report given to RN and Post -op Vital signs reviewed and stable  Post vital signs: Reviewed and stable  Last Vitals:  Vitals Value Taken Time  BP 152/46 12/21/21 1446  Temp    Pulse 64 12/21/21 1449  Resp 11 12/21/21 1449  SpO2 97 % 12/21/21 1449  Vitals shown include unvalidated device data.  Last Pain:  Vitals:   12/21/21 0902  TempSrc:   PainSc: 9       Patients Stated Pain Goal: 3 (74/08/14 4818)  Complications: No notable events documented.

## 2021-12-21 NOTE — Progress Notes (Signed)
C/s re: ESRD pt received AKA today In PACU K pre amp 5.5 Give lokelma, get RFP again this PM If needed will dialyze tonight Full c/s to follow  Madelon Lips MD [place] Pgr [phone]

## 2021-12-21 NOTE — Op Note (Signed)
° ° °  OPERATIVE NOTE   PROCEDURE: right above-the-knee amputation  PRE-OPERATIVE DIAGNOSIS: right foot gangrene  POST-OPERATIVE DIAGNOSIS: same as above  SURGEON: [name], MD  ASSISTANT(S): Arlee Muslim, PA  ANESTHESIA: general  ESTIMATED BLOOD LOSS: <100 mL  FINDING(S): Right above-knee amputation performed with healthy tissue margins and viable muscle.  Stump was closed and staples were placed in the skin.  Dry sterile dressings applied.  SPECIMEN(S):  right above-the-knee amputation  INDICATIONS:   Tammy Moses is a 56 y.o. female who presents with right foot gangrene.  She has undergone right femoral endarterectomy with attempted antegrade stenting by [name].  She has no further options for revascularization.  Significant tissue loss in the foot..  The patient is scheduled for a right above-the-knee amputation.  I discussed in depth with the patient the risks, benefits, and alternatives to this procedure.  The patient is aware that the risk of this operation included but are not limited to:  bleeding, infection, myocardial infarction, stroke, death, failure to heal amputation wound, and possible need for more proximal amputation.  The patient is aware of the risks and agrees proceed forward with the procedure.  An assistant was needed for exposure and to expedite the case and to transect the femur.  DESCRIPTION: After full informed written consent was obtained from the patient, the patient was brought back to the operating room, and placed supine upon the operating table.  Prior to induction, the patient received IV antibiotics.  The patient was then prepped and draped in the standard fashion for an above-the-knee amputation.  I marked out the anterior and posterior flaps for a fish-mouth type of amputation.  I made the incisions for these flaps, and then dissected through the subcutaneous tissue, fascia, and muscles circumferentially.  I elevated  the periosteal  tissue 4 cm more proximal than the anterior skin flap.  I then transected the femur with a power saw at this level with the help of my assistant.  The femoral vessels were ligated between kelly clamps and divided.  I completed the posterior flap.  At this point, the specimen was passed off the field as the above-the-knee amputation.  The femoral vessels were ligated with 2-0 Silk and 2-0 Vicryl Sture.  I clamped all visibly bleeding arteries and veins using a combination of suture ligation and electrocautery.  The stump was washed off with sterile normal saline and no further active bleeding was noted.  I reapproximated the anterior and posterior fascia  with interrupted stitches of 2-0 Vicryl.  This was completed along the entire length of anterior and posterior fascia until there were no more loose space in the fascial line.  The skin was then  reapproximated with staples.  The stump was washed off and dried.  The incision was dressed with [employer] and  then fluffs were applied.  Kerlix was wrapped around the leg and then gently an ACE wrap was applied.    COMPLICATIONS: None  CONDITION: Stable  [name], MD [facility] Office: [place]   12/21/2021, 2:48 PM

## 2021-12-21 NOTE — Anesthesia Procedure Notes (Signed)
Procedure Name: Intubation Date/Time: 12/21/2021 1:38 PM Performed by: Erick Colace, CRNA Pre-anesthesia Checklist: Patient identified, Emergency Drugs available, Suction available and Patient being monitored Patient Re-evaluated:Patient Re-evaluated prior to induction Oxygen Delivery Method: Circle system utilized Preoxygenation: Pre-oxygenation with 100% oxygen Induction Type: IV induction Ventilation: Mask ventilation without difficulty Laryngoscope Size: Mac and 4 Grade View: Grade I Tube type: Oral Tube size: 7.0 mm Number of attempts: 1 Airway Equipment and Method: Stylet and Oral airway Placement Confirmation: ETT inserted through vocal cords under direct vision, positive ETCO2 and breath sounds checked- equal and bilateral Secured at: 23 cm Tube secured with: Tape Dental Injury: Teeth and Oropharynx as per pre-operative assessment

## 2021-12-21 NOTE — Anesthesia Preprocedure Evaluation (Addendum)
Anesthesia Evaluation  Patient identified by MRN, date of birth, ID band Patient awake    Reviewed: Allergy & Precautions, NPO status , Patient's Chart, lab work & pertinent test results  Airway Mallampati: II  TM Distance: >3 FB Neck ROM: Full    Dental  (+) Missing, Poor Dentition, Chipped, Dental Advisory Given   Pulmonary asthma , COPD, former smoker,    breath sounds clear to auscultation       Cardiovascular hypertension, Pt. on medications and Pt. on home beta blockers + Peripheral Vascular Disease   Rhythm:Regular Rate:Normal  Echo: 1. Left ventricular ejection fraction, by estimation, is 60 to 65%. The  left ventricle has normal function. The left ventricle has no regional  wall motion abnormalities. The left ventricular internal cavity size was  mildly dilated. There is mild left  ventricular hypertrophy. Left ventricular diastolic parameters are  consistent with Grade II diastolic dysfunction (pseudonormalization).  Elevated left atrial pressure.  2. Right ventricular systolic function is normal. The right ventricular  size is normal. There is normal pulmonary artery systolic pressure. The  estimated right ventricular systolic pressure is 75.1 mmHg.  3. Right atrial size was mildly dilated.  4. Left atrial size was mildly dilated.  5. The mitral valve is degenerative. Mild mitral valve regurgitation.  Moderate mitral stenosis. The mean mitral valve gradient is 6.0 mmHg at  73bpm. MVA 1.4cm^2 by continuity equation. Moderate mitral annular  calcification.  6. Tricuspid valve regurgitation is mild to moderate.  7. The aortic valve is tricuspid. There is moderate calcification of the  aortic valve. Aortic valve regurgitation is not visualized. Mild aortic  valve stenosis.  8. The inferior vena cava is normal in size with greater than 50%  respiratory variability, suggesting right atrial pressure of 3 mmHg.     Neuro/Psych PSYCHIATRIC DISORDERS Anxiety Depression  Neuromuscular disease    GI/Hepatic PUD, GERD  ,(+) Hepatitis -, C  Endo/Other  diabetes  Renal/GU ESRF and DialysisRenal disease  negative genitourinary   Musculoskeletal negative musculoskeletal ROS (+)   Abdominal Normal abdominal exam  (+)   Peds  Hematology   Anesthesia Other Findings   Reproductive/Obstetrics                            Anesthesia Physical Anesthesia Plan  ASA: 3  Anesthesia Plan: General   Post-op Pain Management:    Induction: Intravenous  PONV Risk Score and Plan: 4 or greater and Ondansetron, Dexamethasone, Midazolam and Treatment may vary due to age or medical condition  Airway Management Planned: Oral ETT  Additional Equipment: None  Intra-op Plan:   Post-operative Plan: Extubation in OR  Informed Consent: I have reviewed the patients History and Physical, chart, labs and discussed the procedure including the risks, benefits and alternatives for the proposed anesthesia with the patient or authorized representative who has indicated his/her understanding and acceptance.     Dental advisory given  Plan Discussed with: CRNA  Anesthesia Plan Comments:        Anesthesia Quick Evaluation

## 2021-12-22 ENCOUNTER — Encounter (HOSPITAL_COMMUNITY): Payer: Self-pay | Admitting: Vascular Surgery

## 2021-12-22 DIAGNOSIS — Z89611 Acquired absence of right leg above knee: Secondary | ICD-10-CM

## 2021-12-22 DIAGNOSIS — E1122 Type 2 diabetes mellitus with diabetic chronic kidney disease: Secondary | ICD-10-CM | POA: Diagnosis not present

## 2021-12-22 DIAGNOSIS — E1152 Type 2 diabetes mellitus with diabetic peripheral angiopathy with gangrene: Principal | ICD-10-CM

## 2021-12-22 DIAGNOSIS — Z992 Dependence on renal dialysis: Secondary | ICD-10-CM | POA: Diagnosis not present

## 2021-12-22 DIAGNOSIS — I150 Renovascular hypertension: Secondary | ICD-10-CM | POA: Diagnosis not present

## 2021-12-22 DIAGNOSIS — N186 End stage renal disease: Secondary | ICD-10-CM | POA: Diagnosis not present

## 2021-12-22 LAB — CBC WITH DIFFERENTIAL/PLATELET
Abs Immature Granulocytes: 0.03 10*3/uL (ref 0.00–0.07)
Basophils Absolute: 0.1 10*3/uL (ref 0.0–0.1)
Basophils Relative: 0 %
Eosinophils Absolute: 0 10*3/uL (ref 0.0–0.5)
Eosinophils Relative: 0 %
HCT: 35.9 % — ABNORMAL LOW (ref 36.0–46.0)
Hemoglobin: 11.2 g/dL — ABNORMAL LOW (ref 12.0–15.0)
Immature Granulocytes: 0 %
Lymphocytes Relative: 8 %
Lymphs Abs: 1 10*3/uL (ref 0.7–4.0)
MCH: 27.3 pg (ref 26.0–34.0)
MCHC: 31.2 g/dL (ref 30.0–36.0)
MCV: 87.3 fL (ref 80.0–100.0)
Monocytes Absolute: 0.6 10*3/uL (ref 0.1–1.0)
Monocytes Relative: 4 %
Neutro Abs: 11.8 10*3/uL — ABNORMAL HIGH (ref 1.7–7.7)
Neutrophils Relative %: 88 %
Platelets: 496 10*3/uL — ABNORMAL HIGH (ref 150–400)
RBC: 4.11 MIL/uL (ref 3.87–5.11)
RDW: 17.3 % — ABNORMAL HIGH (ref 11.5–15.5)
WBC: 13.4 10*3/uL — ABNORMAL HIGH (ref 4.0–10.5)
nRBC: 0 % (ref 0.0–0.2)

## 2021-12-22 LAB — BASIC METABOLIC PANEL
Anion gap: 15 (ref 5–15)
BUN: 26 mg/dL — ABNORMAL HIGH (ref 6–20)
CO2: 28 mmol/L (ref 22–32)
Calcium: 9.2 mg/dL (ref 8.9–10.3)
Chloride: 96 mmol/L — ABNORMAL LOW (ref 98–111)
Creatinine, Ser: 5.24 mg/dL — ABNORMAL HIGH (ref 0.44–1.00)
GFR, Estimated: 9 mL/min — ABNORMAL LOW (ref >60)
Glucose, Bld: 103 mg/dL — ABNORMAL HIGH (ref 70–99)
Potassium: 3.8 mmol/L (ref 3.5–5.1)
Sodium: 139 mmol/L (ref 135–145)

## 2021-12-22 LAB — RENAL FUNCTION PANEL
Albumin: 2.8 g/dL — ABNORMAL LOW (ref 3.5–5.0)
Anion gap: 15 (ref 5–15)
BUN: 66 mg/dL — ABNORMAL HIGH (ref 6–20)
CO2: 24 mmol/L (ref 22–32)
Calcium: 9.8 mg/dL (ref 8.9–10.3)
Chloride: 100 mmol/L (ref 98–111)
Creatinine, Ser: 9.48 mg/dL — ABNORMAL HIGH (ref 0.44–1.00)
GFR, Estimated: 4 mL/min — ABNORMAL LOW (ref >60)
Glucose, Bld: 141 mg/dL — ABNORMAL HIGH (ref 70–99)
Phosphorus: 6.6 mg/dL — ABNORMAL HIGH (ref 2.5–4.6)
Potassium: 5.3 mmol/L — ABNORMAL HIGH (ref 3.5–5.1)
Sodium: 139 mmol/L (ref 135–145)

## 2021-12-22 LAB — HEPATITIS B SURFACE ANTIBODY,QUALITATIVE: Hep B S Ab: REACTIVE — AB

## 2021-12-22 LAB — HEPATITIS B SURFACE ANTIGEN: Hepatitis B Surface Ag: NONREACTIVE

## 2021-12-22 LAB — GLUCOSE, CAPILLARY: Glucose-Capillary: 100 mg/dL — ABNORMAL HIGH (ref 70–99)

## 2021-12-22 MED ORDER — HYDRALAZINE HCL 50 MG PO TABS
100.0000 mg | ORAL_TABLET | Freq: Three times a day (TID) | ORAL | Status: DC
  Administered 2021-12-22 – 2021-12-26 (×11): 100 mg via ORAL
  Filled 2021-12-22 (×11): qty 2

## 2021-12-22 MED ORDER — LOSARTAN POTASSIUM 50 MG PO TABS
100.0000 mg | ORAL_TABLET | Freq: Every day | ORAL | Status: DC
Start: 2021-12-22 — End: 2021-12-26
  Administered 2021-12-22 – 2021-12-26 (×4): 100 mg via ORAL
  Filled 2021-12-22 (×4): qty 2

## 2021-12-22 MED ORDER — LABETALOL HCL 200 MG PO TABS
200.0000 mg | ORAL_TABLET | Freq: Two times a day (BID) | ORAL | Status: DC
  Administered 2021-12-22 – 2021-12-26 (×8): 200 mg via ORAL
  Filled 2021-12-22 (×8): qty 1

## 2021-12-22 MED ORDER — AMLODIPINE BESYLATE 10 MG PO TABS
10.0000 mg | ORAL_TABLET | Freq: Every day | ORAL | Status: DC
  Administered 2021-12-22 – 2021-12-26 (×5): 10 mg via ORAL
  Filled 2021-12-22 (×5): qty 1

## 2021-12-22 MED ORDER — FUROSEMIDE 40 MG PO TABS
40.0000 mg | ORAL_TABLET | Freq: Two times a day (BID) | ORAL | Status: DC
Start: 2021-12-22 — End: 2021-12-26
  Administered 2021-12-22 – 2021-12-26 (×7): 40 mg via ORAL
  Filled 2021-12-22 (×7): qty 1

## 2021-12-22 NOTE — Evaluation (Signed)
Physical Therapy Evaluation Patient Details Name: Tammy Moses MRN: [id number] DOB: 04-06-1966 Today's Date: 12/22/2021  History of Present Illness  56 yo female s/p AKA 12/21/21  PMH: diabetes, ESRD on HD, diabetic neuropathy, diabetic retinopathy, HTN, tobacco dependence, blind in R eye, L eye with limited vision. COPD/asthma  Clinical Impression  Patient presents with pain, post surgical deficits s/p above surgery, baseline impaired vision, impaired cognition, impaired balance, decreased activity tolerance and impaired mobility s/p above. Pt is from a SNF and reports being nonambulatory but using w/c for mobility. Today, pt requires total A of 2 for all mobility; attempted sitting EOB however pt pushing posteriorly with RLE extended and required support or would have slid off the bed. Pt not willing to mobilize right residual limb and tends to hold extremity in a flexed position. Would benefit from return to SNF to maximize independence and mobility. Will follow acutely and progress as pt allows.      Recommendations for follow up therapy are one component of a multi-disciplinary discharge planning process, led by the attending physician.  Recommendations may be updated based on patient status, additional functional criteria and insurance authorization.  Follow Up Recommendations Skilled nursing-short term rehab (<3 hours/day)    Assistance Recommended at Discharge Frequent or constant Supervision/Assistance  Patient can return home with the following  A lot of help with bathing/dressing/bathroom;A lot of help with walking and/or transfers;Two people to help with walking and/or transfers;Direct supervision/assist for medications management;Assist for transportation;Assistance with cooking/housework;Direct supervision/assist for financial management    Equipment Recommendations None recommended by PT  Recommendations for Other Services       Functional Status Assessment Patient has had a  recent decline in their functional status and demonstrates the ability to make significant improvements in function in a reasonable and predictable amount of time.     Precautions / Restrictions Precautions Precautions: Fall Precaution Comments: shrinker for R AKA Restrictions Weight Bearing Restrictions: Yes RLE Weight Bearing: Non weight bearing      Mobility  Bed Mobility Overal bed mobility: Needs Assistance Bed Mobility: Supine to Sit, Sit to Supine, Rolling Rolling: +2 for physical assistance, Total assist   Supine to sit: +2 for physical assistance, Total assist Sit to supine: +2 for physical assistance, Total assist   General bed mobility comments: pt with immediate posterior bias and pushing herself backward. pt stating "no" pt with poor hip flexion for task despite verbal cues/ descriptions. pt never sustained a static sit and required LLE blocked to keep from sliding off the bed    Transfers                   General transfer comment: not appropriate    Ambulation/Gait                  Stairs            Wheelchair Mobility    Modified Rankin (Stroke Patients Only)       Balance                                             Pertinent Vitals/Pain Pain Assessment Pain Assessment: Faces Faces Pain Scale: Hurts whole lot Pain Location: right residual limb Pain Descriptors / Indicators: Discomfort, Grimacing Pain Intervention(s): Monitored during session, Premedicated before session, Repositioned    Home Living Family/patient expects to be discharged  to:: Skilled nursing facility                   Additional Comments: Recent admission and dc to SNF rehab    Prior Function Prior Level of Function : Needs assist       Physical Assist : Mobility (physical) Mobility (physical): Bed mobility;Transfers   Mobility Comments: has been unable to walk since knee injury per patient; recieving PT at SNF per  patient ADLs Comments: needs (A) with adls     Hand Dominance   Dominant Hand: Right    Extremity/Trunk Assessment   Upper Extremity Assessment Upper Extremity Assessment: Defer to OT evaluation    Lower Extremity Assessment Lower Extremity Assessment: RLE deficits/detail;Generalized weakness;LLE deficits/detail RLE Deficits / Details: Keeping right residual limb in hip flexion, not able to get to neutral positioning due to pain. Decreased sensation as pt unable to tell where therapist was touching her. RLE Sensation: decreased light touch LLE Deficits / Details: Limited AROM at ankle and knee, pain with knee flexion. LLE Sensation: decreased light touch    Cervical / Trunk Assessment Cervical / Trunk Assessment: Normal  Communication   Communication: No difficulties  Cognition Arousal/Alertness: Awake/alert Behavior During Therapy: Restless, Flat affect, Impulsive Overall Cognitive Status: Impaired/Different from baseline                                 General Comments: pt attempting to put down an imaginary cigarette on arrival. Pt states "its not lite" pt was able to state today is 2/28 and nearly march. pt unaware of reason for admission.        General Comments General comments (skin integrity, edema, etc.): Report feeling hot, VSS on RA.    Exercises     Assessment/Plan    PT Assessment Patient needs continued PT services  PT Problem List Decreased range of motion;Decreased strength;Decreased mobility;Decreased safety awareness;Decreased knowledge of precautions;Decreased skin integrity;Pain;Impaired sensation;Decreased balance;Decreased cognition;Decreased activity tolerance       PT Treatment Interventions Patient/family education;Balance training;Therapeutic exercise;Therapeutic activities;Wheelchair mobility training;Functional mobility training;Cognitive remediation;DME instruction    PT Goals (Current goals can be found in the Care Plan  section)  Acute Rehab PT Goals Patient Stated Goal: none stated PT Goal Formulation: Patient unable to participate in goal setting Time For Goal Achievement: 01/05/22 Potential to Achieve Goals: Fair    Frequency Min 2X/week     Co-evaluation PT/OT/SLP Co-Evaluation/Treatment: Yes Reason for Co-Treatment: Complexity of the patient's impairments (multi-system involvement);Necessary to address cognition/behavior during functional activity;To address functional/ADL transfers PT goals addressed during session: Mobility/safety with mobility;Strengthening/ROM OT goals addressed during session: ADL's and self-care;Proper use of Adaptive equipment and DME;Strengthening/ROM       AM-PAC PT "6 Clicks" Mobility  Outcome Measure Help needed turning from your back to your side while in a flat bed without using bedrails?: Total Help needed moving from lying on your back to sitting on the side of a flat bed without using bedrails?: Total Help needed moving to and from a bed to a chair (including a wheelchair)?: Total Help needed standing up from a chair using your arms (e.g., wheelchair or bedside chair)?: Total Help needed to walk in hospital room?: Total Help needed climbing 3-5 steps with a railing? : Total 6 Click Score: 6    End of Session   Activity Tolerance: Patient limited by pain Patient left: in bed;with call bell/phone within reach;with bed alarm set Nurse  Communication: Mobility status;Need for lift equipment PT Visit Diagnosis: Pain;Muscle weakness (generalized) (M62.81);Difficulty in walking, not elsewhere classified (R26.2);History of falling (Z91.81) Pain - Right/Left: Right Pain - part of body: Leg    Time: 7618-4859 PT Time Calculation (min) (ACUTE ONLY): 10 min   Charges:   PT Evaluation $PT Eval Moderate Complexity: 1 Mod          Marisa Severin, PT, DPT Acute Rehabilitation Services Pager [phone] Office [phone]     Marguarite Arbour A Sabra Heck 12/22/2021,  11:48 AM

## 2021-12-22 NOTE — Progress Notes (Signed)
[facility]  Subjective  -got dialysis last night.   Objective (!) 166/60 82 97.9 F (36.6 C) (Oral) 15 99%  Intake/Output Summary (Last 24 hours) at 12/22/2021 0736 Last data filed at 12/22/2021 0600 Gross per 24 hour  Intake 780 ml  Output 1084 ml  Net -304 ml    Right AKA dressing clean dry and intact  Laboratory Lab Results: Recent Labs    12/21/21 0924 12/22/21 0109  WBC  --  13.4*  HGB 13.9 11.2*  HCT 41.0 35.9*  PLT  --  496*   BMET Recent Labs    12/21/21 1823 12/22/21 0101  NA 139 139  K 6.4* 5.3*  CL 102 100  CO2 21* 24  GLUCOSE 157* 141*  BUN 62* 66*  CREATININE 9.19* 9.48*  CALCIUM 10.2 9.8    COAG Lab Results  Component Value Date   INR 1.2 10/21/2021   INR 1.2 09/16/2021   INR 0.88 01/30/2014   No results found for: PTT  Assessment/Planning:  Postop day 1 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  We will remove the dressing tomorrow.  Appreciate medicine input for management of comorbidities.  Got dialysis last night.  [name] 12/22/2021 7:36 AM --

## 2021-12-22 NOTE — Progress Notes (Signed)
This provider was paged at bedside because of severe hypertension with SBP>230 on multiple rechecks, and concerns for hallucinations and confusion. Nursing reported she thought she was at her apartment and making a smoking hand gesture. She gave restarted home BP meds including amlodipine, losartan, and hydralazine with minimal change noted.   At bedside BP was persistently SBP>210. She does report new headache starting around 15 minutes prior to our assessment and it was getting somewhat better. She reports feeling a little "woozy."  Cardio: Regular rate and rhythm. Pulm:Normal work of breathing on room air. Neuro: Mental Status: Patient is awake, alert, oriented to place, month, and year, but needed reminder for what she is in the hospital for. No signs of aphasia or neglect Cranial Nerves: V: Facial sensation is symmetric to light touch and temperature. VII: Facial movement is symmetric.  VIII: Hearing is intact to voice X: Uvula elevates symmetrically XI: Shoulder shrug is symmetric. XII: Tongue is midline without atrophy or fasciculations.  Motor: Equal effort thorughout, at least 4/5 bilateral UE, 4/5 bilateral LE Sensory: Sensation is grossly intact and equal bilateral UE & LE  Assessment:Concern more for severe hypertension at this time with neurological intact during our exam. Patient is requesting lights out and decreased sound in her room. Stat BMP being drawn during our exam.  Plan: Advised patient and nursing to page our team if any changes in neurological status or new headaches, dizziness. Added on remaining home anti-hypertensive to start now, labetalol 200 mg BID. Trend BP. Reassess as needed and pursue imaging if indicated. F/u stat BMP.  Farrel Gordon, DO Internal Medicine PGY-1

## 2021-12-22 NOTE — Progress Notes (Addendum)
0025- Off unit to HD via transport. Report given to [place].   [id number]- Back on unit from HD via transport. Report from [place], [place]. Patient with no acute distress. Disoriented to place and time but easily reoriented. VSS. Positioned for comfort. Call bell and personal items placed in reach.

## 2021-12-22 NOTE — NC FL2 (Signed)
[place] LEVEL OF CARE SCREENING TOOL     IDENTIFICATION  Patient Name: Tammy Moses Birthdate: Dec 11, 1965 Sex: female Admission Date (Current Location): 12/21/2021  [facility] and [place] Number:  Herbalist and Address:  [facility] [place] [place] [address] [place], [place] [address]      Provider Number: [id number]  Attending Physician Name and Address:  Lucious Groves, DO  Relative Name and Phone Number:       Current Level of Care: Hospital Recommended Level of Care: [place] Prior Approval Number:    Date Approved/Denied:   PASRR Number: [id number]  Discharge Plan: SNF    Current Diagnoses: Patient Active Problem List   Diagnosis Date Noted   Complication of surgical procedure    Wound dehiscence 11/14/2021   Hyperlipidemia    Hepatitis C carrier ([place])    Diabetic neuropathy ([place])    Retinopathy, diabetic, bilateral ([place])    Acute delirium    Peripheral arterial disease ([place])    Amputation of right great toe [facility])    Physical deconditioning    Gangrene of foot ([place]) 10/23/2021   Patellar sleeve fracture of right knee, closed, initial encounter 10/21/2021   Closed patellar sleeve fracture of right knee 10/21/2021   Malignant hypertension 09/30/2021   Flash pulmonary edema ([place]) 09/30/2021   Non compliance with medical treatment 09/30/2021   Foot pain, right 09/22/2021   Elevated troponin 09/16/2021   Anemia due to chronic kidney disease 09/16/2021   Impaired functional mobility and activity tolerance 01/04/2019   Pneumonia of both lungs due to infectious organism 10/05/2017   Fall    Anemia in chronic kidney disease (CKD) 07/12/2017   COPD (chronic obstructive pulmonary disease) ([place]) 06/24/2017   ESRD (end stage renal disease) ([place]) -  Dialyzes at [facility].  On Monday Wednesday Friday. 06/17/2017   Foot drop 05/26/2017   Carpal tunnel syndrome of left wrist 05/20/2017   Ulnar neuropathy of  left upper extremity 05/20/2017   Poor compliance with medication 05/17/2017   Peripheral neuropathy 05/10/2017   Controlled type 2 diabetes mellitus with chronic kidney disease on chronic dialysis ([place]) 05/10/2017   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   Vitamin D deficiency 04/07/2017   Absolute glaucoma of right eye 12/27/2016   Glaucoma (increased eye pressure) 12/27/2016   PCO (posterior capsule opacification), left 12/27/2016   Gait abnormality 03/07/2014   Abnormality of gait 03/07/2014   Hepatitis C virus infection without hepatic coma 03/04/2014   History of hepatitis C 03/04/2014   Erosive gastritis 02/14/2014   Pseudophakia of left eye 10/30/2012   Asthma 10/13/2012   Reflux 10/13/2012   Lens replaced by other means 10/12/2012   Primary open angle glaucoma 10/12/2012   BACK PAIN, LUMBAR 08/18/2010   IDDM 08/11/2010   HYPERCHOLESTEROLEMIA 08/11/2010   HYPOKALEMIA 08/11/2010   DEPRESSION 08/11/2010   GLAUCOMA 08/11/2010   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   Hypertension 08/11/2010   GERD 08/11/2010   CONSTIPATION 08/06/2009    Orientation RESPIRATION BLADDER Height & Weight     Self  Normal Continent Weight: 103 lb 9.9 oz (47 kg) Height:  5\' 1"  (154.9 cm)  BEHAVIORAL SYMPTOMS/MOOD NEUROLOGICAL BOWEL NUTRITION STATUS      Continent Diet (Please See DC Summary)  AMBULATORY STATUS COMMUNICATION OF NEEDS Skin   Extensive Assist Verbally PU Stage and Appropriate Care, Other (Comment) (open or dehisced non-pressure wound groin right)  Personal Care Assistance Level of Assistance  Bathing, Feeding, Dressing Bathing Assistance: Maximum assistance Feeding assistance: Limited assistance Dressing Assistance: Limited assistance     Functional Limitations Info  Sight, Hearing, Speech Sight Info: Impaired Hearing Info: Adequate Speech Info: Adequate    SPECIAL CARE FACTORS FREQUENCY  PT (By licensed PT), OT (By licensed OT)     PT Frequency:  5x/week OT Frequency: 5x/week            Contractures Contractures Info: Not present    Additional Factors Info  Code Status, Allergies Code Status Info: FULL Allergies Info: Acyclovir And Related           Current Medications (12/22/2021):  This is the current hospital active medication list Current Facility-Administered Medications  Medication Dose Route Frequency Provider Last Rate Last Admin   0.9 %  sodium chloride infusion  100 mL Intravenous PRN Madelon Lips, MD       0.9 %  sodium chloride infusion  100 mL Intravenous PRN Madelon Lips, MD       acetaminophen (TYLENOL) tablet 650 mg  650 mg Oral Q6H PRN Dagoberto Ligas, PA-C       alteplase (CATHFLO ACTIVASE) injection 2 mg  2 mg Intracatheter Once PRN Madelon Lips, MD       amLODipine (NORVASC) tablet 10 mg  10 mg Oral Daily Farrel Gordon, DO   10 mg at 12/22/21 0749   atorvastatin (LIPITOR) tablet 80 mg  80 mg Oral Daily Farrel Gordon, DO   80 mg at 12/22/21 0748   Chlorhexidine Gluconate Cloth 2 % PADS 6 each  6 each Topical Q0600 Madelon Lips, MD   6 each at 12/22/21 0751   furosemide (LASIX) tablet 40 mg  40 mg Oral BID Farrel Gordon, DO   40 mg at 12/22/21 0750   heparin injection 1,000 Units  1,000 Units Dialysis PRN Madelon Lips, MD       hydrALAZINE (APRESOLINE) tablet 100 mg  100 mg Oral TID Farrel Gordon, DO   100 mg at 12/22/21 0750   HYDROmorphone (DILAUDID) injection 0.5 mg  0.5 mg Intravenous Q2H PRN Dagoberto Ligas, PA-C   0.5 mg at 12/22/21 1143   labetalol (NORMODYNE) tablet 200 mg  200 mg Oral BID Farrel Gordon, DO   200 mg at 12/22/21 0908   lidocaine (PF) (XYLOCAINE) 1 % injection 5 mL  5 mL Intradermal PRN Madelon Lips, MD       lidocaine-prilocaine (EMLA) cream 1 application  1 application Topical PRN Madelon Lips, MD       losartan (COZAAR) tablet 100 mg  100 mg Oral Daily Farrel Gordon, DO   100 mg at 12/22/21 0748   oxyCODONE-acetaminophen (PERCOCET/ROXICET) 5-325 MG per tablet  1-2 tablet  1-2 tablet Oral Q4H PRN Dagoberto Ligas, PA-C   2 tablet at 12/22/21 2446   pentafluoroprop-tetrafluoroeth (GEBAUERS) aerosol 1 application  1 application Topical PRN Madelon Lips, MD         Discharge Medications: Please see discharge summary for a list of discharge medications.  Relevant Imaging Results:  Relevant Lab Results:   Additional Information SSN#: [id number]  Ryman Rathgeber, LCSW

## 2021-12-22 NOTE — Progress Notes (Signed)
Pt receives out-pt HD at [facility] in [facility]. Pt's schedule is MWF. Pt arrives at 10:45 for 11:00 chair time. Will assist as needed.  Melven Sartorius Renal Navigator [phone]

## 2021-12-22 NOTE — Progress Notes (Addendum)
Administered all bp meds to pt due to bp=234/5. MD present at the bedside. Will continue to monitor the pt.   Lavenia Atlas, RN

## 2021-12-22 NOTE — Consult Note (Signed)
Reason for Consult: To manage dialysis and dialysis related needs Referring Physician: Dr Clydell Hakim is an 56 y.o. female.  HPI: Pt is a 26F with a PMH sig for HTN, HLD, DMII, peripheral arterial disease, and ESRD on HD MWF at [facility] who is now seen in consultation at the request of Dr, Heber Meridian for provision of HD and management of ESRD.  Pt underwent R AKA yesterday by Dr Trula Slade d/t no other options for revascularization.  Pre-op K was 5.5.  Postop K was 6.4.  she was dialyzed in the wee hours of the morning.    This AM pt reports that she is feeling well.  Some expected pain.  Otherwise wants to get up and move around.      Dialyzes at [facility]- these are orders from last admission, updated pending OP HD: MWF [facility]   3.5h  52kg   350/600   AVF  Hep 1500 then 500u/hr  - EPO 4200 tiw w/ HD, last 1/20  Past Medical History:  Diagnosis Date   Anemia    Anxiety    Blind right eye    Chronic kidney disease, stage V (HCC)    Confusion    at times   Constipation, chronic    Dental caries    Depression    Diabetes mellitus    Diabetic neuropathy ([place])    Diabetic retinopathy    GERD (gastroesophageal reflux disease)    Glaucoma    H. pylori infection    Hepatitis C carrier ([place])    High risk sexual behavior    Hyperlipidemia    Hypertension    Hypoglycemia 07/12/2017   Insomnia    Microalbuminuria    Nonspecific elevation of levels of transaminase or lactic acid dehydrogenase (LDH)    Tobacco dependence    Vitamin D deficiency     Past Surgical History:  Procedure Laterality Date   ABDOMINAL AORTOGRAM W/LOWER EXTREMITY N/A 10/27/2021   Procedure: ABDOMINAL AORTOGRAM W/LOWER EXTREMITY;  Surgeon: Serafina Mitchell, MD;  Location: [facility];  Service: Cardiovascular;  Laterality: N/A;   ABDOMINAL HYSTERECTOMY  04/30/2009   PARTIAL   AMPUTATION Right 10/30/2021   Procedure: RIGHT GREAT TOE AMPUTATION;  Surgeon: Serafina Mitchell, MD;  Location: [facility];   Service: Vascular;  Laterality: Right;   AMPUTATION Right 12/21/2021   Procedure: RIGHT ABOVE KNEE AMPUTATION;  Surgeon: [name], MD;  Location: [facility];  Service: Vascular;  Laterality: Right;   COLONOSCOPY     DIALYSIS FISTULA CREATION Left 06/2017   ENDARTERECTOMY FEMORAL Right 10/30/2021   Procedure: RIGHT FEMORAL ENDARTERECTOMY WITH ANGIOPLASTY OF TIBIAL ARTERY;  Surgeon: Serafina Mitchell, MD;  Location: [facility];  Service: Vascular;  Laterality: Right;   ESOPHAGOGASTRODUODENOSCOPY     GROIN DEBRIDEMENT Right 11/14/2021   Procedure: Wound exploration and closure.;  Surgeon: Angelia Mould, MD;  [facility];  Service: Vascular;  Laterality: Right;   IR AV DIALY SHUNT INTRO NEEDLE/INTRACATH INITIAL W/PTA/IMG RIGHT Right 10/07/2021   IR FLUORO GUIDE CV LINE RIGHT  10/06/2021   IR US GUIDE VASC ACCESS RIGHT  10/06/2021   IR US GUIDE VASC ACCESS RIGHT  10/08/2021    Family History  Problem Relation Age of Onset   High blood pressure Mother    Diabetes Mother    Thyroid disease Mother    Diabetes Father    High blood pressure Father    Cerebral palsy Daughter    Other Son  still born    Social History:  reports that she quit smoking about 8 weeks ago. Her smoking use included cigarettes. She smoked an average of .25 packs per day. She has never been exposed to tobacco smoke. She has never used smokeless tobacco. She reports that she does not currently use alcohol. She reports that she does not currently use drugs after having used the following drugs: Marijuana and Cocaine.  Allergies:  Allergies  Allergen Reactions   Acyclovir And Related Itching    Medications: Scheduled:  amLODipine  10 mg Oral Daily   atorvastatin  80 mg Oral Daily   Chlorhexidine Gluconate Cloth  6 each Topical Q0600   furosemide  40 mg Oral BID   hydrALAZINE  100 mg Oral TID   labetalol  200 mg Oral BID   losartan  100 mg Oral Daily     Results for orders placed or performed  during the hospital encounter of 12/21/21 (from the past 48 hour(s))  SARS Coronavirus 2 by RT PCR (hospital order, performed in [facility] hospital lab) Nasopharyngeal Nasopharyngeal Swab     Status: None   Collection Time: 12/21/21  8:26 AM   Specimen: Nasopharyngeal Swab  Result Value Ref Range   SARS Coronavirus 2 NEGATIVE NEGATIVE    Comment: (NOTE) SARS-CoV-2 target nucleic acids are NOT DETECTED.  The SARS-CoV-2 RNA is generally detectable in upper and lower respiratory specimens during the acute phase of infection. The lowest concentration of SARS-CoV-2 viral copies this assay can detect is 250 copies / mL. A negative result does not preclude SARS-CoV-2 infection and should not be used as the sole basis for treatment or other patient management decisions.  A negative result may occur with improper specimen collection / handling, submission of specimen other than nasopharyngeal swab, presence of viral mutation(s) within the areas targeted by this assay, and inadequate number of viral copies (<250 copies / mL). A negative result must be combined with clinical observations, patient history, and epidemiological information.  Fact Sheet for Patients:   StrictlyIdeas.no  Fact Sheet for Healthcare Providers: BankingDealers.co.za  This test is not yet approved or  cleared by the [place] FDA and has been authorized for detection and/or diagnosis of SARS-CoV-2 by FDA under an Emergency Use Authorization (EUA).  This EUA will remain in effect (meaning this test can be used) for the duration of the COVID-19 declaration under Section 564(b)(1) of the Act, 21 U.S.C. section 360bbb-3(b)(1), unless the authorization is terminated or revoked sooner.  Performed at [facility], [place] [address], [place], [place] [address]   Glucose, capillary     Status: Abnormal   Collection Time: 12/21/21  8:36 AM  Result Value Ref Range    Glucose-Capillary 107 (H) 70 - 99 mg/dL    Comment: Glucose reference range applies only to samples taken after fasting for at least 8 hours.  I-STAT, chem 8     Status: Abnormal   Collection Time: 12/21/21  9:24 AM  Result Value Ref Range   Sodium 142 135 - 145 mmol/L   Potassium 5.5 (H) 3.5 - 5.1 mmol/L   Chloride 104 98 - 111 mmol/L   BUN 72 (H) 6 - 20 mg/dL   Creatinine, Ser 9.50 (H) 0.44 - 1.00 mg/dL   Glucose, Bld 107 (H) 70 - 99 mg/dL    Comment: Glucose reference range applies only to samples taken after fasting for at least 8 hours.   Calcium, Ion 1.29 1.15 - 1.40 mmol/L  TCO2 30 22 - 32 mmol/L   Hemoglobin 13.9 12.0 - 15.0 g/dL   HCT 41.0 36.0 - 46.0 %  Glucose, capillary     Status: Abnormal   Collection Time: 12/21/21 11:19 AM  Result Value Ref Range   Glucose-Capillary 105 (H) 70 - 99 mg/dL    Comment: Glucose reference range applies only to samples taken after fasting for at least 8 hours.  Glucose, capillary     Status: Abnormal   Collection Time: 12/21/21  2:46 PM  Result Value Ref Range   Glucose-Capillary 122 (H) 70 - 99 mg/dL    Comment: Glucose reference range applies only to samples taken after fasting for at least 8 hours.  Renal function panel     Status: Abnormal   Collection Time: 12/21/21  6:23 PM  Result Value Ref Range   Sodium 139 135 - 145 mmol/L   Potassium 6.4 (HH) 3.5 - 5.1 mmol/L    Comment: CRITICAL RESULT CALLED TO, READ BACK BY AND VERIFIED WITH: D.SOLOMAN,RN 12/21/2021 AT 20227 A.HUGHES    Chloride 102 98 - 111 mmol/L   CO2 21 (L) 22 - 32 mmol/L   Glucose, Bld 157 (H) 70 - 99 mg/dL    Comment: Glucose reference range applies only to samples taken after fasting for at least 8 hours.   BUN 62 (H) 6 - 20 mg/dL   Creatinine, Ser 9.19 (H) 0.44 - 1.00 mg/dL   Calcium 10.2 8.9 - 10.3 mg/dL   Phosphorus 6.1 (H) 2.5 - 4.6 mg/dL   Albumin 3.0 (L) 3.5 - 5.0 g/dL   GFR, Estimated 5 (L) >60 mL/min    Comment: (NOTE) Calculated using the CKD-EPI  Creatinine Equation (2021)    Anion gap 16 (H) 5 - 15    Comment: Performed at [facility] [address] [place], [place] [address]  Renal function panel     Status: Abnormal   Collection Time: 12/22/21  1:01 AM  Result Value Ref Range   Sodium 139 135 - 145 mmol/L   Potassium 5.3 (H) 3.5 - 5.1 mmol/L    Comment: DELTA CHECK NOTED   Chloride 100 98 - 111 mmol/L   CO2 24 22 - 32 mmol/L   Glucose, Bld 141 (H) 70 - 99 mg/dL    Comment: Glucose reference range applies only to samples taken after fasting for at least 8 hours.   BUN 66 (H) 6 - 20 mg/dL   Creatinine, Ser 9.48 (H) 0.44 - 1.00 mg/dL   Calcium 9.8 8.9 - 10.3 mg/dL   Phosphorus 6.6 (H) 2.5 - 4.6 mg/dL   Albumin 2.8 (L) 3.5 - 5.0 g/dL   GFR, Estimated 4 (L) >60 mL/min    Comment: (NOTE) Calculated using the CKD-EPI Creatinine Equation (2021)    Anion gap 15 5 - 15    Comment: Performed at [facility] [address], [place], [place] [address]  CBC with Differential/Platelet     Status: Abnormal   Collection Time: 12/22/21  1:09 AM  Result Value Ref Range   WBC 13.4 (H) 4.0 - 10.5 K/uL   RBC 4.11 3.87 - 5.11 MIL/uL   Hemoglobin 11.2 (L) 12.0 - 15.0 g/dL   HCT 35.9 (L) 36.0 - 46.0 %   MCV 87.3 80.0 - 100.0 fL   MCH 27.3 26.0 - 34.0 pg   MCHC 31.2 30.0 - 36.0 g/dL   RDW 17.3 (H) 11.5 - 15.5 %   Platelets 496 (H) 150 -  400 K/uL   nRBC 0.0 0.0 - 0.2 %   Neutrophils Relative % 88 %   Neutro Abs 11.8 (H) 1.7 - 7.7 K/uL   Lymphocytes Relative 8 %   Lymphs Abs 1.0 0.7 - 4.0 K/uL   Monocytes Relative 4 %   Monocytes Absolute 0.6 0.1 - 1.0 K/uL   Eosinophils Relative 0 %   Eosinophils Absolute 0.0 0.0 - 0.5 K/uL   Basophils Relative 0 %   Basophils Absolute 0.1 0.0 - 0.1 K/uL   Immature Granulocytes 0 %   Abs Immature Granulocytes 0.03 0.00 - 0.07 K/uL    Comment: Performed at [facility] [address], [place], [place] [address]  Hepatitis B surface antigen     Status: None   Collection Time:  12/22/21  6:11 AM  Result Value Ref Range   Hepatitis B Surface Ag NON REACTIVE NON REACTIVE    Comment: Performed at [facility] [address] [place], [place] [address]  Glucose, capillary     Status: Abnormal   Collection Time: 12/22/21  7:48 AM  Result Value Ref Range   Glucose-Capillary 100 (H) 70 - 99 mg/dL    Comment: Glucose reference range applies only to samples taken after fasting for at least 8 hours.  Basic metabolic panel     Status: Abnormal   Collection Time: 12/22/21  8:24 AM  Result Value Ref Range   Sodium 139 135 - 145 mmol/L   Potassium 3.8 3.5 - 5.1 mmol/L    Comment: DELTA CHECK NOTED   Chloride 96 (L) 98 - 111 mmol/L   CO2 28 22 - 32 mmol/L   Glucose, Bld 103 (H) 70 - 99 mg/dL    Comment: Glucose reference range applies only to samples taken after fasting for at least 8 hours.   BUN 26 (H) 6 - 20 mg/dL   Creatinine, Ser 5.24 (H) 0.44 - 1.00 mg/dL    Comment: DELTA CHECK NOTED   Calcium 9.2 8.9 - 10.3 mg/dL   GFR, Estimated 9 (L) >60 mL/min    Comment: (NOTE) Calculated using the CKD-EPI Creatinine Equation (2021)    Anion gap 15 5 - 15    Comment: Performed at [facility] [address], [place],  [address]    No results found.  ROS: all other systems reviewed and are negative as per HPI Blood pressure (!) 170/65, pulse 78, temperature 98.9 F (37.2 C), temperature source Oral, resp. rate 16, height 5\' 1"  (1.549 m), weight 47 kg, SpO2 97 %. GEN NAD, lying flat in bed HEENT R eye clouded NECK no JVD PULM clear CV RRR ABD soft EXT s/p R AKA, in dressing, no LLE edema NEURO Aao x 3 ACCESS LUE AVF + T/B  Assessment/Plan: 1 gangrene of R foot- s/p R aka by Dr Trula Slade 12/21/21.   2 ESRD: MWF normally- dialyzed in the wee hours of Monday night/ Tuesday AM- next for Wednesday 12/23/21 3 Hypertension: home meds- BP up a little 4. Anemia of ESRD: Hgb 11.2, follow 5. Metabolic Bone Disease: binders and VDRA 6.  Nutrition: renal/  carb modified 7. Dispo: ? CIR  Russie Gulledge 12/22/2021, 1:50 PM

## 2021-12-22 NOTE — Evaluation (Signed)
Occupational Therapy Evaluation Patient Details Name: Tammy Moses MRN: [id number] DOB: [birth year] Today's Date: 12/22/2021   History of Present Illness 56 yo female s/p R AKA 12/21/21  PMH: diabetes, ESRD on HD, diabetic neuropathy, diabetic retinopathy, HTN, tobacco dependence, blind in R eye, L eye with limited vision. COPD/asthma   Clinical Impression   Patient is s/p R AKA surgery resulting in functional limitations due to the deficits listed below (see OT problem list). Pt demonstrates cognitive deficits and decreased activity tolerance. Pt with visual deficits and affecting balance for static sitting too.  Patient will benefit from skilled OT acutely to increase independence and safety with ADLS to allow discharge SNF. (From snf needs to return SNF)       Recommendations for follow up therapy are one component of a multi-disciplinary discharge planning process, led by the attending physician.  Recommendations may be updated based on patient status, additional functional criteria and insurance authorization.   Follow Up Recommendations  Skilled nursing-short term rehab (<3 hours/day)    Assistance Recommended at Discharge Intermittent Supervision/Assistance  Patient can return home with the following      Functional Status Assessment  Patient has had a recent decline in their functional status and demonstrates the ability to make significant improvements in function in a reasonable and predictable amount of time.  Equipment Recommendations  Wheelchair (measurements OT);Wheelchair cushion (measurements OT);Hospital bed;Other (comment) (hoyer)    Recommendations for Other Services       Precautions / Restrictions Precautions Precautions: Fall Precaution Comments: shrinker for R AKA Restrictions Weight Bearing Restrictions: Yes RLE Weight Bearing: Non weight bearing      Mobility Bed Mobility Overal bed mobility: Needs Assistance Bed Mobility: Supine to Sit, Sit to  Supine, Rolling Rolling: +2 for physical assistance, Total assist   Supine to sit: +2 for physical assistance, Total assist Sit to supine: +2 for physical assistance, Total assist   General bed mobility comments: pt with immediate posterior bias and pushing herself backward. pt stating "no" pt with poor hip flexion for task despite verbal cues/ descriptions. pt never sustained a static sit and required LLE blocked to keep from sliding off the bed    Transfers                   General transfer comment: not appropriate      Balance                                           ADL either performed or assessed with clinical judgement   ADL Overall ADL's : Needs assistance/impaired Eating/Feeding: Minimal assistance Eating/Feeding Details (indicate cue type and reason): need (A) due to visual deficits     Upper Body Bathing: Moderate assistance   Lower Body Bathing: Maximal assistance                               Vision Baseline Vision/History: 2 Legally blind Ability to See in Adequate Light: 4 Severely impaired Additional Comments: pt reports seeing shadows but on evaluation without any correct responses to visual testing     Perception     Praxis      Pertinent Vitals/Pain Pain Assessment Pain Assessment: Faces Pain Location: R LE Pain Descriptors / Indicators: Discomfort Pain Intervention(s): Repositioned, Monitored during session, Premedicated before session  Hand Dominance Right   Extremity/Trunk Assessment Upper Extremity Assessment Upper Extremity Assessment: Generalized weakness   Lower Extremity Assessment Lower Extremity Assessment: Defer to PT evaluation;RLE deficits/detail RLE Deficits / Details: AKA with ace wrap dry at this time. pt coudl benefit from shrinker sooner due to likely to d/c ace wrap   Cervical / Trunk Assessment Cervical / Trunk Assessment: Normal   Communication Communication Communication:  No difficulties   Cognition Arousal/Alertness: Awake/alert Behavior During Therapy: Restless, Flat affect, Impulsive Overall Cognitive Status: Impaired/Different from baseline                                 General Comments: pt attempting to put down an imaginary cigarette on arrival. Pt states "its not lite" pt was abel to state today is 2/28 and nearly march. pt unaware of reason for admission     General Comments  pt reports feeling hot but all VSS    Exercises Exercises: Other exercises Other Exercises Other Exercises: attempted to have pt lying flat with R LE hip neutral position. pt holding R LE in hip flexion and slightly abducted   Shoulder Instructions      Home Living Family/patient expects to be discharged to:: Skilled nursing facility                                 Additional Comments: Recent admission and dc to SNF rehab      Prior Functioning/Environment Prior Level of Function : Needs assist               ADLs Comments: needs (A) with adls        OT Problem List: Decreased strength;Decreased activity tolerance;Impaired balance (sitting and/or standing);Decreased cognition;Decreased safety awareness;Decreased knowledge of use of DME or AE;Decreased knowledge of precautions;Cardiopulmonary status limiting activity      OT Treatment/Interventions: Self-care/ADL training;Therapeutic exercise;Neuromuscular education;Energy conservation;DME and/or AE instruction;Manual therapy;Therapeutic activities;Cognitive remediation/compensation;Patient/family education;Balance training    OT Goals(Current goals can be found in the care plan section) Acute Rehab OT Goals Patient Stated Goal: to put down her cigarette OT Goal Formulation: Patient unable to participate in goal setting Time For Goal Achievement: 01/05/22 Potential to Achieve Goals: Good  OT Frequency: Min 2X/week    Co-evaluation PT/OT/SLP Co-Evaluation/Treatment:  Yes Reason for Co-Treatment: Complexity of the patient's impairments (multi-system involvement);Necessary to address cognition/behavior during functional activity;For patient/therapist safety;To address functional/ADL transfers   OT goals addressed during session: ADL's and self-care;Proper use of Ada[facility] equipment and DME;Strengthening/ROM      AM-PAC OT "6 Clicks" Daily Activity     Outcome Measure Help from another person eating meals?: A Little Help from another person taking care of personal grooming?: A Little Help from another person toileting, which includes using toliet, bedpan, or urinal?: A Lot Help from another person bathing (including washing, rinsing, drying)?: Total Help from another person to put on and taking off regular upper body clothing?: A Lot Help from another person to put on and taking off regular lower body clothing?: Total 6 Click Score: 12   End of Session Nurse Communication: Mobility status;Precautions;Weight bearing status  Activity Tolerance: Patient tolerated treatment well Patient left: in bed;with call bell/phone within reach;with bed alarm set  OT Visit Diagnosis: Unsteadiness on feet (R26.81);Muscle weakness (generalized) (M62.81)                Time: 8657-8469  OT Time Calculation (min): 19 min Charges:  OT General Charges $OT Visit: 1 Visit OT Evaluation $OT Eval Moderate Complexity: 1 Mod   Brynn, OTR/L  Acute Rehabilitation Services Pager: [phone] Office: [phone] .   [name] 12/22/2021, 11:09 AM

## 2021-12-22 NOTE — TOC Initial Note (Addendum)
Transition of Care [facility]) - Initial/Assessment Note    Patient Details  Name: Tammy Moses MRN: [id number] Date of Birth: [birth year]  [facility]) CM/SW Contact:    Milas Gain, [place] Phone Number: 12/22/2021, 4:13 PM  Clinical Narrative:                    CSW awaiting called [facility] and spoke with Gabriel Cirri to inquire if patient came from there long term or short term. Gabriel Cirri informed CSW to call Crystal who helps out with admissions for [employer] [facility] [employer]. CSW called Crystal and LVM. CSW awaiting callback.    Update-4:30pm- CSW spoke with Crystal with [facility] who confirmed patient comes from [facility] long term.    CSW spoke with patients niece who is agreeable to patient returning back to [facility] and to receive some short term rehab when medically ready for dc. CSW following to start insurance authorization close to patient being medically ready for dc.     Patient Goals and CMS Choice        Expected Discharge Plan and Services                                                Prior Living Arrangements/Services                       Activities of Daily Living Home Assistive Devices/Equipment: Cane (specify quad or straight), Walker (specify type), Wheelchair, CBG Meter, Eyeglasses ADL Screening (condition at time of admission) Patient's cognitive ability adequate to safely complete daily activities?: Yes Is the patient deaf or have difficulty hearing?: No Does the patient have difficulty seeing, even when wearing glasses/contacts?: No Does the patient have difficulty concentrating, remembering, or making decisions?: No Patient able to express need for assistance with ADLs?: Yes Does the patient have difficulty dressing or bathing?: Yes Independently performs ADLs?: Yes (appropriate for developmental age) Does the patient have difficulty walking or climbing stairs?: Yes Weakness of Legs:  Both Weakness of Arms/Hands: Both  Permission Sought/Granted                  Emotional Assessment              Admission diagnosis:  Gangrene of foot ([place]) [I96] Patient Active Problem List   Diagnosis Date Noted   Complication of surgical procedure    Wound dehiscence 11/14/2021   Hyperlipidemia    Hepatitis C carrier ([place])    Diabetic neuropathy ([place])    Retinopathy, diabetic, bilateral ([place])    Acute delirium    Peripheral arterial disease ([place])    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[place])    Physical deconditioning    Gangrene of foot ([place]) 10/23/2021   Patellar sleeve fracture of right knee, closed, initial encounter 10/21/2021   Closed patellar sleeve fracture of right knee 10/21/2021   Malignant hypertension 09/30/2021   Flash pulmonary edema ([place]) 09/30/2021   Non compliance with medical treatment 09/30/2021   Foot pain, right 09/22/2021   Elevated troponin 09/16/2021   Anemia due to chronic kidney disease 09/16/2021   Impaired functional mobility and activity tolerance 01/04/2019   Pneumonia of both lungs due to infectious organism 10/05/2017   Fall    Anemia in chronic kidney disease (CKD) 07/12/2017   COPD (chronic obstructive pulmonary  disease) ([place]) 06/24/2017   ESRD (end stage renal disease) ([place]) -  Dialyzes at [facility].  On Monday Wednesday Friday. 06/17/2017   Foot drop 05/26/2017   Carpal tunnel syndrome of left wrist 05/20/2017   Ulnar neuropathy of left upper extremity 05/20/2017   Poor compliance with medication 05/17/2017   Peripheral neuropathy 05/10/2017   Controlled type 2 diabetes mellitus with chronic kidney disease on chronic dialysis ([place]) 05/10/2017   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   Vitamin D deficiency 04/07/2017   Absolute glaucoma of right eye 12/27/2016   Glaucoma (increased eye pressure) 12/27/2016   PCO (posterior capsule opacification), left 12/27/2016   Gait abnormality 03/07/2014   Abnormality of gait  03/07/2014   Hepatitis C virus infection without hepatic coma 03/04/2014   History of hepatitis C 03/04/2014   Erosive gastritis 02/14/2014   Pseudophakia of left eye 10/30/2012   Asthma 10/13/2012   Reflux 10/13/2012   Lens replaced by other means 10/12/2012   Primary open angle glaucoma 10/12/2012   BACK PAIN, LUMBAR 08/18/2010   IDDM 08/11/2010   HYPERCHOLESTEROLEMIA 08/11/2010   HYPOKALEMIA 08/11/2010   DEPRESSION 08/11/2010   GLAUCOMA 08/11/2010   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   Hypertension 08/11/2010   GERD 08/11/2010   CONSTIPATION 08/06/2009   PCP:  [name], FNP Pharmacy:   [facility] [place], [place] - [address] [place] [address] Phone: [phone] Fax: [facility] [address] [place] [address] Phone: [phone] Fax: [phone]     Social Determinants of Health (SDOH) Interventions    Readmission Risk Interventions Readmission Risk Prevention Plan 10/09/2021  Transportation Screening Complete  HRI or Home Care Consult Complete  SW Recovery Care/Counseling Consult Complete  Palliative Care Screening Not Applicable  Skilled Nursing Facility Complete  Some recent data might be hidden

## 2021-12-22 NOTE — Progress Notes (Signed)
HD#1 SUBJECTIVE:  [name] is a 56 y.o. female with PMH ESRD on HD MWF, T2DM, HTN, HLD who presented for R AKA after failing conservative measures admitted for post-op management of chronic medical conditions POD 1 from R AKA.   Overnight Events: Patient was hypertensive overnight.  Interim History: Reassessment reveals no change in neurological status from earlier this morning. She has no signs of overt deficits and neurological exam is consistent. BP has come down appropriately to SBP 146 with re-starting all of her home medications. She no longer has headache or woozy feeling.   OBJECTIVE:  Vital Signs: Vitals:   12/22/21 0349 12/22/21 0411 12/22/21 0429 12/22/21 0452  BP: 132/63  (!) 218/74 (!) 166/60  Pulse: 77  78 82  Resp: 20   15  Temp:  97.6 F (36.4 C)  97.9 F (36.6 C)  TempSrc:    Oral  SpO2: 98%   99%  Weight:   47 kg   Height:       Supplemental O2: Room Air SpO2: 99 %  Filed Weights   12/21/21 0832 12/22/21 0039 12/22/21 0429  Weight: 52.2 kg 48.4 kg 47 kg     Intake/Output Summary (Last 24 hours) at 12/22/2021 1610 Last data filed at 12/22/2021 0600 Gross per 24 hour  Intake 780 ml  Output 1084 ml  Net -304 ml   Net IO Since Admission: -304 mL [12/22/21 0722]  Physical Exam: Constitutional:Chronically-ill appearing female resting comfortably in bed. No acute distress noted. Cardio:Regular rate and rhythm.  Pulm:Normal work of breathing on room air. Abdomen:Soft, nontender, nondistended. MSK:S/p R AKA. Decreased bulk and tone of remainder of extremities. Skin:Warm and dry. Neuro:Alert, oriented to self, place, year; some confusion regarding month and why she is in the hospital. Unchanged from morning exam. Psych:Pleasant mood and affect.  Patient Lines/Drains/Airways Status     Active Line/Drains/Airways     Name Placement date Placement time Site Days   Peripheral IV 12/21/21 20 G Right Antecubital 12/21/21  0930   Antecubital  1   Fistula / Graft Left Upper arm 07/12/17  1900  Upper arm  1624   Negative Pressure Wound Therapy Groin Anterior;Proximal;Right 11/14/21  1102  --  38   Incision (Closed) 10/30/21 Groin Right 10/30/21  1402  -- 53   Incision (Closed) 10/30/21 Foot Right 10/30/21  1426  -- 53   Incision (Closed) 11/14/21 Groin Right 11/14/21  1105  -- 38   Incision (Closed) 12/21/21 Leg Right 12/21/21  1424  -- 1   Wound / Incision (Open or Dehisced) 07/12/17 Other (Comment) Arm Left 07/12/17  1900  Arm  1624   Wound / Incision (Open or Dehisced) 12/21/21 Non-pressure wound Groin Right 12/21/21  1757  Groin  1             ASSESSMENT/PLAN:  Assessment: Principal Problem:   Gangrene of foot ([place])   Plan: [name] is a 56 y.o. female with PMH ESRD on HD MWF, T2DM, HTN, HLD who presented for R AKA after failing conservative measures admitted for post-op management of chronic medical conditions.POD 1 from R AKA.   #R BKA S/p R BKA 96/04 without complications.  -Management per VVS -PT/OT recommending SNF at discharge   #ESRD on HD MWF Underwent HD overnight. Potassium, BUN/Cr improved with overnight HD.  -Nephrology consulted, appreciate their assistance -Trend BMP -Repeat lokelma if needed   #HTN Patient has been progressively hypertensive after procedure with  SBP reaching >230 this morning. Home regimen was restarted with appropriate decreases in BP, measurement of SBP in 140s during this exam and MAPs appropriate. -Restart home labetalol 200 mg BID, hydralazine 100 mg TID, amlodpine 10 mg daily, lasix 40 mg BID, losartan 100 mg daily   #Type 2 diabetes mellitus HbA1c 5.5% in January 2023. Chronically managed with humalog SSI. -novoLOG SSI   #HLD Chronically managed with atorvastatin 80 mg daily. -Continue atorvastatin 80 mg daily  Best Practice: Diet: Renal/carb modified with 1200 mL fluid restriction IVF: Fluids: none VTE: SCDs Start: 12/21/21 1541 Code:  Full AB: None DISPO: Anticipated discharge in 1-3 days to [place] facility pending Medical stability and Insurance for SNF coverage.  Signature: Farrel Gordon, D.O.  Internal Medicine Resident, PGY-1 [facility]  Pager: [phone] 7:22 AM, 12/22/2021   Please contact the on call pager after 5 pm and on weekends at [phone].

## 2021-12-23 DIAGNOSIS — I120 Hypertensive chronic kidney disease with stage 5 chronic kidney disease or end stage renal disease: Secondary | ICD-10-CM

## 2021-12-23 DIAGNOSIS — E785 Hyperlipidemia, unspecified: Secondary | ICD-10-CM

## 2021-12-23 LAB — BASIC METABOLIC PANEL
Anion gap: 13 (ref 5–15)
BUN: 42 mg/dL — ABNORMAL HIGH (ref 6–20)
CO2: 26 mmol/L (ref 22–32)
Calcium: 9.3 mg/dL (ref 8.9–10.3)
Chloride: 98 mmol/L (ref 98–111)
Creatinine, Ser: 6.66 mg/dL — ABNORMAL HIGH (ref 0.44–1.00)
GFR, Estimated: 7 mL/min — ABNORMAL LOW (ref >60)
Glucose, Bld: 129 mg/dL — ABNORMAL HIGH (ref 70–99)
Potassium: 5.3 mmol/L — ABNORMAL HIGH (ref 3.5–5.1)
Sodium: 137 mmol/L (ref 135–145)

## 2021-12-23 LAB — CBC
HCT: 35.6 % — ABNORMAL LOW (ref 36.0–46.0)
Hemoglobin: 10.6 g/dL — ABNORMAL LOW (ref 12.0–15.0)
MCH: 26.2 pg (ref 26.0–34.0)
MCHC: 29.8 g/dL — ABNORMAL LOW (ref 30.0–36.0)
MCV: 87.9 fL (ref 80.0–100.0)
Platelets: 354 10*3/uL (ref 150–400)
RBC: 4.05 MIL/uL (ref 3.87–5.11)
RDW: 17.2 % — ABNORMAL HIGH (ref 11.5–15.5)
WBC: 9.6 10*3/uL (ref 4.0–10.5)
nRBC: 0 % (ref 0.0–0.2)

## 2021-12-23 LAB — RENAL FUNCTION PANEL
Albumin: 3 g/dL — ABNORMAL LOW (ref 3.5–5.0)
Anion gap: 16 — ABNORMAL HIGH (ref 5–15)
BUN: 62 mg/dL — ABNORMAL HIGH (ref 6–20)
CO2: 21 mmol/L — ABNORMAL LOW (ref 22–32)
Calcium: 10.2 mg/dL (ref 8.9–10.3)
Chloride: 102 mmol/L (ref 98–111)
Creatinine, Ser: 9.19 mg/dL — ABNORMAL HIGH (ref 0.44–1.00)
GFR, Estimated: 5 mL/min — ABNORMAL LOW (ref >60)
Glucose, Bld: 157 mg/dL — ABNORMAL HIGH (ref 70–99)
Phosphorus: 6.1 mg/dL — ABNORMAL HIGH (ref 2.5–4.6)
Potassium: 6.4 mmol/L (ref 3.5–5.1)
Sodium: 139 mmol/L (ref 135–145)

## 2021-12-23 LAB — SURGICAL PATHOLOGY

## 2021-12-23 LAB — HEPATITIS B SURFACE ANTIBODY, QUANTITATIVE: Hep B S AB Quant (Post): 85 m[IU]/mL (ref >9.9)

## 2021-12-23 MED ORDER — DOCUSATE SODIUM 100 MG PO CAPS
100.0000 mg | ORAL_CAPSULE | Freq: Two times a day (BID) | ORAL | Status: DC
  Administered 2021-12-23 – 2021-12-26 (×5): 100 mg via ORAL
  Filled 2021-12-23 (×5): qty 1

## 2021-12-23 MED ORDER — SODIUM ZIRCONIUM CYCLOSILICATE 10 G PO PACK: 10.0000 g | PACK | Freq: Two times a day (BID) | ORAL | Status: DC

## 2021-12-23 MED ORDER — POLYETHYLENE GLYCOL 3350 17 G PO PACK
17.0000 g | PACK | Freq: Every day | ORAL | Status: DC
  Administered 2021-12-23 – 2021-12-26 (×3): 17 g via ORAL
  Filled 2021-12-23 (×2): qty 1

## 2021-12-23 NOTE — TOC Progression Note (Signed)
Transition of Care (TOC) - Progression Note  ? ? ?Patient Details  ?Name: Tammy Moses ?MRN: [id number] ?Date of Birth: 02/06/1966 ? ?Transition of Care (TOC) CM/SW Contact  ?Vinie Sill, LCSW ?Phone Number: ?12/23/2021, 4:11 PM ? ?Clinical Narrative:    ? ?Insurance remains pending ? ?Expected Discharge Plan: [place] ?Barriers to Discharge: Ship broker, Continued Medical Work up ? ?Expected Discharge Plan and Services ?Expected Discharge Plan: [place] ?In-house Referral: Clinical Social Work ?  ?  ?  ?                ?  ?  ?  ?  ?  ?  ?  ?  ?  ?  ? ? ?Social Determinants of Health (SDOH) Interventions ?  ? ?Readmission Risk Interventions ?Readmission Risk Prevention Plan 10/09/2021  ?Transportation Screening Complete  ?[place] or Home Care Consult Complete  ?SW Recovery Care/Counseling Consult Complete  ?Palliative Care Screening Not Applicable  ?Skilled Nursing Facility Complete  ?Some recent data might be hidden  ? ? ?

## 2021-12-23 NOTE — Progress Notes (Signed)
?  [facility] ?Progress Note  ? ?Assessment/ Plan:   ? ?OP HD: MWF [facility] ?  3.5h  52kg   350/600   AVF  Hep 1500 then 500u/hr ? - EPO 4200 tiw w/ HD, last 1/20 ? 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   ?2 ESRD: MWF normally- dialyzed in the wee hours of Monday night/ Tuesday AM- HD today 12/23/21 ?3 Hypertension: home meds- BP up a little 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  Nutrition: renal/ carb modified ?7. Dispo: ? CIR ? ?Subjective:   ? ?For HD today.  No complaints, resting.    ? ?Objective:   ?BP (!) 117/51 (BP Location: Right Arm)   Pulse 78   Temp 98 ?F (36.7 ?C) (Oral)   Resp 18   Ht 5\' 1"  (1.549 m)   Wt 44.6 kg   SpO2 95%   BMI 18.58 kg/m?  ? ?Physical Exam: ?GEN NAD, lying flat in bed ?HEENT R eye clouded ?NECK no JVD ?PULM clear ?CV RRR ?ABD soft ?EXT s/p R AKA, in dressing, no LLE edema ?NEURO Aao x 3 ?ACCESS LUE AVF + T/B ? ?Labs: ?BMET ?Recent Labs  ?Lab 12/21/21 ?7121 12/21/21 ?1823 12/22/21 ?0101 12/22/21 ?9758 12/23/21 ?0154  ?NA 142 139 139 139 137  ?K 5.5* 6.4* 5.3* 3.8 5.3*  ?CL 104 102 100 96* 98  ?CO2  --  21* 24 28 26   ?GLUCOSE 107* 157* 141* 103* 129*  ?BUN 72* 62* 66* 26* 42*  ?CREATININE 9.50* 9.19* 9.48* 5.24* 6.66*  ?CALCIUM  --  10.2 9.8 9.2 9.3  ?PHOS  --  6.1* 6.6*  --   --   ? ?CBC ?Recent Labs  ?Lab 12/21/21 ?8325 12/22/21 ?0109 12/23/21 ?0154  ?WBC  --  13.4* 9.6  ?NEUTROABS  --  11.8*  --   ?HGB 13.9 11.2* 10.6*  ?HCT 41.0 35.9* 35.6*  ?MCV  --  87.3 87.9  ?PLT  --  496* 354  ? ? ?  ?Medications:   ? ? amLODipine  10 mg Oral Daily  ? atorvastatin  80 mg Oral Daily  ? Chlorhexidine Gluconate Cloth  6 each Topical Q0600  ? furosemide  40 mg Oral BID  ? hydrALAZINE  100 mg Oral TID  ? labetalol  200 mg Oral BID  ? losartan  100 mg Oral Daily  ? sodium zirconium cyclosilicate  10 g Oral BID  ? ? ? ?Madelon Lips, MD ?12/23/2021, 12:48 PM   ?

## 2021-12-23 NOTE — Progress Notes (Signed)
[facility] [place] ? ?Subjective  - no complaints ? ? ?Objective ?(!) 196/56 ?79 ?98.6 ?F (37 ?C) (Oral) ?18 ?98% ? ?Intake/Output Summary (Last 24 hours) at 12/23/2021 0910 ?Last data filed at 12/23/2021 1093 ?Gross per 24 hour  ?Intake 460 ml  ?Output 0 ml  ?Net 460 ml  ? ? ?Right AKA c/d/i ? ?Laboratory ?Lab Results: ?Recent Labs  ?  12/22/21 ?0109 12/23/21 ?0154  ?WBC 13.4* 9.6  ?HGB 11.2* 10.6*  ?HCT 35.9* 35.6*  ?PLT 496* 354  ? ?BMET ?Recent Labs  ?  12/22/21 ?0824 12/23/21 ?0154  ?NA 139 137  ?K 3.8 5.3*  ?CL 96* 98  ?CO2 28 26  ?GLUCOSE 103* 129*  ?BUN 26* 42*  ?CREATININE 5.24* 6.66*  ?CALCIUM 9.2 9.3  ? ? ?COAG ?Lab Results  ?Component Value Date  ? INR 1.2 10/21/2021  ? INR 1.2 09/16/2021  ? INR 0.88 01/30/2014  ? ?No results found for: PTT ? ?Assessment/Planning: ? ?Postop day 2 status post right above-knee amputation.  Dressing removed today and stump looks excellent.  Discussed staples will stay for 4 to 6 weeks from my standpoint. ? ?[name] ?12/23/2021 ?9:10 AM ?-- ? ? ?

## 2021-12-23 NOTE — TOC Progression Note (Signed)
Transition of Care (TOC) - Progression Note  ? ? ?Patient Details  ?Name: Tammy Moses ?MRN: [id number] ?Date of Birth: August 19, 1966 ? ?Transition of Care (TOC) CM/SW Contact  ?Vinie Sill, LCSW ?Phone Number: ?12/23/2021, 12:46 PM ? ?Clinical Narrative:    ? ?Insurance authorization started  and is pending: reference # [id number]  ? ?Expected Discharge Plan: [place] ?Barriers to Discharge: Ship broker, Continued Medical Work up ? ?Expected Discharge Plan and Services ?Expected Discharge Plan: [place] ?In-house Referral: Clinical Social Work ?  ?  ?  ?                ?  ?  ?  ?  ?  ?  ?  ?  ?  ?  ? ? ?Social Determinants of Health (SDOH) Interventions ?  ? ?Readmission Risk Interventions ?Readmission Risk Prevention Plan 10/09/2021  ?Transportation Screening Complete  ?[place] or Home Care Consult Complete  ?SW Recovery Care/Counseling Consult Complete  ?Palliative Care Screening Not Applicable  ?Skilled Nursing Facility Complete  ?Some recent data might be hidden  ? ? ?

## 2021-12-23 NOTE — Progress Notes (Addendum)
? ?HD#2 ?SUBJECTIVE:  ?Patient Summary: Tammy Moses is a 56 y.o. female with PMH ESRD on HD MWF, T2DM, HTN, HLD who presented for R AKA after failing conservative measures admitted for post-op management of chronic medical conditions POD 1 from R AKA.  ? ?Overnight Events: None ? ?Interim History: Patient reports feeling overall well this morning and denies chest pain, headache, lightheadedness.  She got some sleep last night and has good pain control at this time.  She is not looking forward to hemodialysis today. ? ?OBJECTIVE:  ?Vital Signs: ?Vitals:  ? 12/22/21 1935 12/22/21 2128 12/22/21 2317 12/23/21 0500  ?BP: (!) 149/56 (!) 173/55 (!) 140/48 (!) 192/64  ?Pulse: 74 74 72 77  ?Resp: 14  15 12   ?Temp: 98.5 ?F (36.9 ?C)  98.8 ?F (37.1 ?C) 98.9 ?F (37.2 ?C)  ?TempSrc: Oral  Oral Oral  ?SpO2: 99%  98% 98%  ?Weight:    44.6 kg  ?Height:      ? ?Supplemental O2: Room Air ?SpO2: 98 % ? ?Filed Weights  ? 12/22/21 0039 12/22/21 0429 12/23/21 0500  ?Weight: 48.4 kg 47 kg 44.6 kg  ? ? ? ?Intake/Output Summary (Last 24 hours) at 12/23/2021 0659 ?Last data filed at 12/22/2021 2318 ?Gross per 24 hour  ?Intake 540 ml  ?Output 0 ml  ?Net 540 ml  ? ?Net IO Since Admission: 236 mL [12/23/21 0659] ? ?Physical Exam: ?Constitutional: Chronically ill-appearing female resting comfortably in bed.  No acute distress noted. ?Cardio: Regular rate and rhythm.  No murmurs, rubs, gallops. ?Pulm: Clear to anterior auscultation bilaterally.  Normal work of breathing on room air. ?Abdomen: Soft, nontender, nondistended. ?MSK: S/p R AKA.  Decreased bulk and tone of left lower extremity. ?Skin: Warm and dry. ?Neuro: Alert, oriented to self, place, time.  Unable to assess left lower extremity strength as she states that she cannot move the leg secondary to pain.  Bilateral upper extremity strength 5 out of 5, sensation intact. ?Psych: Normal mood and affect. ? ?Patient Lines/Drains/Airways Status   ? ? Active Line/Drains/Airways   ? ? Name  Placement date Placement time Site Days  ? Peripheral IV 12/21/21 20 G Right Antecubital 12/21/21  0930  Antecubital  2  ? Fistula / Graft Left Upper arm 07/12/17  1900  Upper arm  1625  ? Negative Pressure Wound Therapy Groin Anterior;Proximal;Right 11/14/21  1102  --  39  ? Incision (Closed) 10/30/21 Groin Right 10/30/21  1402  -- 54  ? Incision (Closed) 10/30/21 Foot Right 10/30/21  1426  -- 54  ? Incision (Closed) 11/14/21 Groin Right 11/14/21  1105  -- 39  ? Incision (Closed) 12/21/21 Leg Right 12/21/21  1424  -- 2  ? Wound / Incision (Open or Dehisced) 07/12/17 Other (Comment) Arm Left 07/12/17  1900  Arm  1625  ? Wound / Incision (Open or Dehisced) 12/21/21 Non-pressure wound Groin Right 12/21/21  1757  Groin  2  ? ?  ?  ? ?  ? ? ? ?ASSESSMENT/PLAN:  ?Assessment: ?Principal Problem: ?  Gangrene of foot ([place]) ?Active Problems: ?  Hypertension ?  Controlled type 2 diabetes mellitus with chronic kidney disease on chronic dialysis ([place]) ?  ESRD (end stage renal disease) ([place]) -  Dialyzes at [facility].  On Monday Wednesday Friday. ?  S/P AKA (above knee amputation) unilateral, right ([place]) ? ? ?Plan: ?Tammy Moses is a 56 y.o. female with PMH ESRD on HD MWF, T2DM, HTN, HLD who  presented for R AKA after failing conservative measures admitted for post-op management of chronic medical conditions.POD 1 from R AKA. ?  ?#R AKA ?S/p R AKA 63/78 without complications.  ?-Management per VVS ? -Compressive bandage taken down today with normal stump ? -We will have staples in for 4 to 6 weeks ?-PT/OT recommending SNF at discharge, family agreeable to [facility].  Will reach out to TOC/SW to start insurance authorization process. ?  ?#ESRD on HD MWF ?Renal function appears closer to baseline at this time.  Hyperkalemia 5.3.  She is due for hemodialysis today.   ?-Nephrology consulted, appreciate their assistance ?-Trend BMP ?-Repeat lokelma if needed ?  ?#HTN ?HTN persistent at this time.  She was restarted on  home labetalol, hydralazine, amlodipine, Lasix, losartan on 02/28.  She is due for hemodialysis today and I am hopeful that this will further improve her hypertension. ?-Continue home labetalol 200 mg BID, hydralazine 100 mg TID, amlodpine 10 mg daily, lasix 40 mg BID, losartan 100 mg daily ?  ?#Type 2 diabetes mellitus ?HbA1c 5.5% in January 2023. Chronically managed with humalog SSI. ?-novoLOG SSI ?  ?#HLD ?Chronically managed with atorvastatin 80 mg daily. ?-Continue atorvastatin 80 mg daily ? ?Best Practice: ?Diet: Renal/carb modified with 1200 mL fluid restriction ?IVF: Fluids: none ?VTE: SCDs Start: 12/21/21 1541 ?Code: Full ?AB: None ?DISPO: Anticipated discharge in 1-3 days to [place] pending Medical stability and Insurance for SNF coverage. ? ?Signature: ?Farrel Gordon, D.O.  ?Internal Medicine Resident, PGY-1 ?[facility]  ?Pager: [phone] ?6:59 AM, 12/23/2021  ? ?Please contact the on call pager after 5 pm and on weekends at [phone]. ? ?

## 2021-12-24 LAB — CBC
HCT: 36 % (ref 36.0–46.0)
Hemoglobin: 11.2 g/dL — ABNORMAL LOW (ref 12.0–15.0)
MCH: 26.9 pg (ref 26.0–34.0)
MCHC: 31.1 g/dL (ref 30.0–36.0)
MCV: 86.3 fL (ref 80.0–100.0)
Platelets: 357 10*3/uL (ref 150–400)
RBC: 4.17 MIL/uL (ref 3.87–5.11)
RDW: 17 % — ABNORMAL HIGH (ref 11.5–15.5)
WBC: 10.8 10*3/uL — ABNORMAL HIGH (ref 4.0–10.5)
nRBC: 0 % (ref 0.0–0.2)

## 2021-12-24 LAB — BASIC METABOLIC PANEL
Anion gap: 16 — ABNORMAL HIGH (ref 5–15)
BUN: 26 mg/dL — ABNORMAL HIGH (ref 6–20)
CO2: 25 mmol/L (ref 22–32)
Calcium: 9.2 mg/dL (ref 8.9–10.3)
Chloride: 95 mmol/L — ABNORMAL LOW (ref 98–111)
Creatinine, Ser: 4.7 mg/dL — ABNORMAL HIGH (ref 0.44–1.00)
GFR, Estimated: 10 mL/min — ABNORMAL LOW (ref >60)
Glucose, Bld: 102 mg/dL — ABNORMAL HIGH (ref 70–99)
Potassium: 3.7 mmol/L (ref 3.5–5.1)
Sodium: 136 mmol/L (ref 135–145)

## 2021-12-24 NOTE — TOC Progression Note (Signed)
Transition of Care (TOC) - Progression Note  ? ? ?Patient Details  ?Name: Tammy Moses ?MRN: [id number] ?Date of Birth: 02-Nov-1965 ? ?Transition of Care (TOC) CM/SW Contact  ?Vinie Sill, LCSW ?Phone Number: ?12/24/2021, 5:04 PM ? ?Clinical Narrative:    ? ?CSW met with patient at bedside. CSW introduced self and explained role. Patient confirmed she arrived from [facility]. Patient is agreeable to returning to [facility] once medically stable. Patient gave permission to call her mother. CSW spoke with the patient's mother, and confirmed family was agreeable to patient returning to [name]. ? ?Received insurance authorization # [id number] from 03/02-03/06- CSW will send approval information to SNF in the morning.  ? ?CSW requested covid test  ?CSW will update SNF in the morning of approval ?CSW will notify Renal Navigator - in the morning ?TOC will continue to follow and assist with discharge planning. ? ?Thurmond Butts, MSW, LCSW ?Clinical Social Worker ? ? ? ?Expected Discharge Plan: [place] ?Barriers to Discharge: Ship broker, Continued Medical Work up ? ?Expected Discharge Plan and Services ?Expected Discharge Plan: [place] ?In-house Referral: Clinical Social Work ?  ?  ?  ?    ?  ?  ?  ?  ?  ?  ?  ?  ?  ? ? ?Social Determinants of Health (SDOH) Interventions ?  ? ?Readmission Risk Interventions ?Readmission Risk Prevention Plan 10/09/2021  ?Transportation Screening Complete  ?[place] or Home Care Consult Complete  ?SW Recovery Care/Counseling Consult Complete  ?Palliative Care Screening Not Applicable  ?Skilled Nursing Facility Complete  ?Some recent data might be hidden  ? ? ?

## 2021-12-24 NOTE — Progress Notes (Addendum)
[facility] [place] ? ?Subjective  - doing OK, not much pain. ? ? ?Objective ?(!) 144/54 ?76 ?98.1 ?F (36.7 ?C) (Oral) ?16 ?99% ? ?Intake/Output Summary (Last 24 hours) at 12/24/2021 0702 ?Last data filed at 12/24/2021 2111 ?Gross per 24 hour  ?Intake 120 ml  ?Output -176 ml  ?Net 296 ml  ? ? ? ?Well healing stump without ischemic changes ? ?Assessment/Planning: ?POD # 3 s/p right AKA ? ?Stable post op disposition from a vascular point of view ?Stump appears viable and healing well.  Stump sock ordered today. ?Plan for staple removal in 4 weeks. ? ?[name] ?12/24/2021 ?7:02 AM ?-- ? ?Laboratory ?Lab Results: ?Recent Labs  ?  12/23/21 ?0154 12/24/21 ?0141  ?WBC 9.6 10.8*  ?HGB 10.6* 11.2*  ?HCT 35.6* 36.0  ?PLT 354 357  ? ?BMET ?Recent Labs  ?  12/23/21 ?0154 12/24/21 ?0141  ?NA 137 136  ?K 5.3* 3.7  ?CL 98 95*  ?CO2 26 25  ?GLUCOSE 129* 102*  ?BUN 42* 26*  ?CREATININE 6.66* 4.70*  ?CALCIUM 9.3 9.2  ? ? ?COAG ?Lab Results  ?Component Value Date  ? INR 1.2 10/21/2021  ? INR 1.2 09/16/2021  ? INR 0.88 01/30/2014  ? ?No results found for: PTT ? ?I have seen and evaluated the patient. I agree with the PA note as documented above. Right AKA looks good. ? ?[name], MD ?[facility] ?Office: [phone] ? ? ?

## 2021-12-24 NOTE — Progress Notes (Signed)
Orthopedic Tech Progress Note ?Patient Details:  ?Tammy Moses ?[birth year] ?[id number] ? ?Patient ID: Tammy Moses, female   DOB: January 18, 1966, 56 y.o.   MRN: [id number] ? ?Tammy Moses ?12/24/2021, 9:14 AM ?Placed order with hanger ?

## 2021-12-24 NOTE — Progress Notes (Signed)
? ?HD#3 ?SUBJECTIVE:  ?Patient Summary: Tammy Moses. Tammy Moses is a 56 y.o. female with PMH ESRD on HD MWF, T2DM, HTN, HLD who presented for R AKA after failing conservative measures admitted for post-op management of chronic medical conditions POD 2 from R AKA.  ? ?Overnight Events: None ? ?Interim History: Patient states that she is feeling pretty good this morning. She had just finished using the restroom. No complaints or concerns at this time. ? ?OBJECTIVE:  ?Vital Signs: ?Vitals:  ? 12/23/21 2046 12/23/21 2054 12/24/21 0015 12/24/21 0327  ?BP: (!) 211/62 (!) 193/64 (!) 165/50 (!) 144/54  ?Pulse:   79 76  ?Resp:   15 16  ?Temp:   98.8 ?F (37.1 ?C) 98.1 ?F (36.7 ?C)  ?TempSrc:   Oral Oral  ?SpO2:   98% 99%  ?Weight:      ?Height:      ? ?Supplemental O2: Room Air ?SpO2: 99 % ? ?Filed Weights  ? 12/22/21 0429 12/23/21 0500 12/23/21 1301  ?Weight: 47 kg 44.6 kg 45.7 kg  ? ? ? ?Intake/Output Summary (Last 24 hours) at 12/24/2021 0730 ?Last data filed at 12/24/2021 3716 ?Gross per 24 hour  ?Intake 120 ml  ?Output -176 ml  ?Net 296 ml  ? ?Net IO Since Admission: 532 mL [12/24/21 0730] ? ?Physical Exam: ?Constitutional: Chronically ill-appearing female resting comfortably in bed.  No acute distress noted. ?Cardio: Regular rate and rhythm.  No murmurs, rubs, gallops. ?Pulm: Clear to anterior auscultation bilaterally.  Normal work of breathing on room air. ?Abdomen: Soft, nontender, nondistended. ?MSK: S/p R AKA.  Decreased bulk and tone of left lower extremity. ?Skin: Warm and dry. ?Neuro: Alert, oriented to self, place, time.  Moves extremities spontaneously. ?Psych: Normal mood and affect. ? ?Patient Lines/Drains/Airways Status   ? ? Active Line/Drains/Airways   ? ? Name Placement date Placement time Site Days  ? Peripheral IV 12/21/21 20 G Right Antecubital 12/21/21  0930  Antecubital  3  ? Fistula / Graft Left Upper arm 07/12/17  1900  Upper arm  1626  ? Negative Pressure Wound Therapy Groin Anterior;Proximal;Right  11/14/21  1102  --  40  ? Incision (Closed) 10/30/21 Groin Right 10/30/21  1402  -- 55  ? Incision (Closed) 10/30/21 Foot Right 10/30/21  1426  -- 55  ? Incision (Closed) 11/14/21 Groin Right 11/14/21  1105  -- 40  ? Incision (Closed) 12/21/21 Leg Right 12/21/21  1424  -- 3  ? Wound / Incision (Open or Dehisced) 07/12/17 Other (Comment) Arm Left 07/12/17  1900  Arm  1626  ? Wound / Incision (Open or Dehisced) 12/21/21 Non-pressure wound Groin Right 12/21/21  1757  Groin  3  ? ?  ?  ? ?  ? ? ? ?ASSESSMENT/PLAN:  ?Assessment: ?Principal Problem: ?  Gangrene of foot ([place]) ?Active Problems: ?  Hypertension ?  Controlled type 2 diabetes mellitus with chronic kidney disease on chronic dialysis ([place]) ?  ESRD (end stage renal disease) ([place]) -  Dialyzes at [facility].  On Monday Wednesday Friday. ?  S/P AKA (above knee amputation) unilateral, right ([place]) ? ? ?Plan: ?Tammy Moses is a 56 y.o. female with PMH ESRD on HD MWF, T2DM, HTN, HLD who presented for R AKA after failing conservative measures admitted for post-op management of chronic medical conditions.POD 2 from R AKA. ?  ?#R AKA ?S/p R AKA 96/78 without complications. Slight bump in WBC to 10.8. Will keep in mind the common causes of  post-operative infection: wind, water, wound, walking, wonder drugs. ?-Management per VVS ?            -Compressive bandage taken down 03/01 with normal stump ?            -Will have staples in for 4 weeks ?-PT/OT recommending SNF at discharge, family agreeable to [facility].  Insurance approval pending at this time. TOC/SW on assisting in this process. ?  ?#ESRD on HD MWF ?Renal function appears closer to baseline at this time.  Hyperkalemia appropriate after HD.  ?-Nephrology consulted, appreciate their assistance ?-Trend BMP ?-Repeat lokelma if needed ?  ?#HTN ?HTN persistent at this time.  She was restarted on home labetalol, hydralazine, amlodipine, Lasix, losartan on 02/28.  HTN improved after HD 03/01 though continues  to have large fluctuations.  ?-Continue home labetalol 200 mg BID, hydralazine 100 mg TID, amlodpine 10 mg daily, lasix 40 mg BID, losartan 100 mg daily ?  ?#Type 2 diabetes mellitus ?HbA1c 5.5% in January 2023. Chronically managed with humalog SSI. ?-novoLOG SSI ?  ?#HLD ?Chronically managed with atorvastatin 80 mg daily. ?-Continue atorvastatin 80 mg daily ? ?Best Practice: ?Diet: Renal/carb modified with 1200 mL fluid restriction ?IVF: Fluids: none ?VTE: SCDs Start: 12/21/21 1541 ?Code: Full ?AB: None ?DISPO: Anticipated discharge in 1-3 days to [place] pending Medical stability and Insurance for SNF coverage. ? ?Signature: ?Farrel Gordon, D.O.  ?Internal Medicine Resident, PGY-1 ?[facility]  ?Pager: [phone] ?7:30 AM, 12/24/2021  ? ?Please contact the on call pager after 5 pm and on weekends at [phone]. ? ?

## 2021-12-24 NOTE — TOC Progression Note (Signed)
Transition of Care (TOC) - Progression Note  ? ? ?Patient Details  ?Name: Tammy Moses ?MRN: [id number] ?Date of Birth: [birth year] ? ?Transition of Care (TOC) CM/SW Contact  ?Vinie Sill, LCSW ?Phone Number: ?12/24/2021, 10:00 AM ? ?Clinical Narrative:    ? ?Authorization  remains pending ? ?Expected Discharge Plan: [place] ?Barriers to Discharge: Ship broker, Continued Medical Work up ? ?Expected Discharge Plan and Services ?Expected Discharge Plan: [place] ?In-house Referral: Clinical Social Work ?  ?  ?  ?                ?  ?  ?  ?  ?  ?  ?  ?  ?  ?  ? ? ?Social Determinants of Health (SDOH) Interventions ?  ? ?Readmission Risk Interventions ?Readmission Risk Prevention Plan 10/09/2021  ?Transportation Screening Complete  ?[place] or Home Care Consult Complete  ?SW Recovery Care/Counseling Consult Complete  ?Palliative Care Screening Not Applicable  ?Skilled Nursing Facility Complete  ?Some recent data might be hidden  ? ? ?

## 2021-12-24 NOTE — Progress Notes (Signed)
?  [facility] ?Progress Note  ? ?Assessment/ Plan:   ? ?OP HD: MWF [facility] ?  3.5h  52kg   350/600   AVF  Hep 1500 then 500u/hr ? - EPO 4200 tiw w/ HD, last 1/20 ? 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   ?2 ESRD: MWF normally- dialyzed in the wee hours of Monday night/ Tuesday AM- on schedule for tomorrow ?3 Hypertension: home meds- BP up and down, labile.   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  Nutrition: renal/ carb modified ?7. Dispo: ? CIR ? ?Subjective:   ? ?Seen in room.  Eating breakfast.  Pain well-controlled  ? ?Objective:   ?BP (!) 116/44 (BP Location: Right Arm)   Pulse 80   Temp 98.4 ?F (36.9 ?C) (Oral)   Resp 16   Ht 5\' 1"  (1.549 m)   Wt 45.7 kg   SpO2 92%   BMI 19.04 kg/m?  ? ?Physical Exam: ?GEN NAD, sitting in bed eating breakfast ?HEENT R eye clouded ?NECK no JVD ?PULM clear ?CV RRR ?ABD soft ?EXT s/p R AKA, in dressing, no LLE edema ?NEURO Aao x 3 ?ACCESS LUE AVF + T/B ? ?Labs: ?BMET ?Recent Labs  ?Lab 12/21/21 ?6948 12/21/21 ?1823 12/22/21 ?0101 12/22/21 ?5462 12/23/21 ?0154 12/24/21 ?0141  ?NA 142 139 139 139 137 136  ?K 5.5* 6.4* 5.3* 3.8 5.3* 3.7  ?CL 104 102 100 96* 98 95*  ?CO2  --  21* 24 28 26 25   ?GLUCOSE 107* 157* 141* 103* 129* 102*  ?BUN 72* 62* 66* 26* 42* 26*  ?CREATININE 9.50* 9.19* 9.48* 5.24* 6.66* 4.70*  ?CALCIUM  --  10.2 9.8 9.2 9.3 9.2  ?PHOS  --  6.1* 6.6*  --   --   --   ? ?CBC ?Recent Labs  ?Lab 12/21/21 ?7035 12/22/21 ?0109 12/23/21 ?0154 12/24/21 ?0141  ?WBC  --  13.4* 9.6 10.8*  ?NEUTROABS  --  11.8*  --   --   ?HGB 13.9 11.2* 10.6* 11.2*  ?HCT 41.0 35.9* 35.6* 36.0  ?MCV  --  87.3 87.9 86.3  ?PLT  --  496* 354 357  ? ? ?  ?Medications:   ? ? amLODipine  10 mg Oral Daily  ? atorvastatin  80 mg Oral Daily  ? Chlorhexidine Gluconate Cloth  6 each Topical Q0600  ? docusate sodium  100 mg Oral BID  ? furosemide  40 mg Oral BID  ? hydrALAZINE  100 mg Oral TID  ?  labetalol  200 mg Oral BID  ? losartan  100 mg Oral Daily  ? polyethylene glycol  17 g Oral Daily  ? ? ? ?Madelon Lips, MD ?12/24/2021, 11:44 AM   ?

## 2021-12-24 NOTE — Consult Note (Signed)
[facility] CM Inpatient Consult ? ? ?12/24/2021 ? ?Tammy Moses ?12/05/1965 ?[id number] ? ?[facility] CM) ?  ?Patient chart reviewed with noted extreme high risk score for unplanned readmission. Review of patient's medical record reveals patient is being recommended for a skilled nursing facility level of care.  ?  ?Plan: No [facility] CM follow up needed as needs will be met at SNF level of care. ? ?Of note, [facility] Care Management services does not replace or interfere with any services that are arranged by inpatient case management or social work.  ? ?Netta Cedars, MSN, RN ?[facility] Liaison ?Mobile Phone [phone]  ?Toll free office [phone] ?

## 2021-12-25 DIAGNOSIS — Z89611 Acquired absence of right leg above knee: Secondary | ICD-10-CM

## 2021-12-25 LAB — CBC
HCT: 35.1 % — ABNORMAL LOW (ref 36.0–46.0)
Hemoglobin: 10.8 g/dL — ABNORMAL LOW (ref 12.0–15.0)
MCH: 26.4 pg (ref 26.0–34.0)
MCHC: 30.8 g/dL (ref 30.0–36.0)
MCV: 85.8 fL (ref 80.0–100.0)
Platelets: 361 10*3/uL (ref 150–400)
RBC: 4.09 MIL/uL (ref 3.87–5.11)
RDW: 16.8 % — ABNORMAL HIGH (ref 11.5–15.5)
WBC: 9.6 10*3/uL (ref 4.0–10.5)
nRBC: 0 % (ref 0.0–0.2)

## 2021-12-25 LAB — RESP PANEL BY RT-PCR (FLU A&B, COVID) ARPGX2
Influenza A by PCR: NEGATIVE
Influenza B by PCR: NEGATIVE
SARS Coronavirus 2 by RT PCR: NEGATIVE

## 2021-12-25 LAB — BASIC METABOLIC PANEL
Anion gap: 13 (ref 5–15)
BUN: 41 mg/dL — ABNORMAL HIGH (ref 6–20)
CO2: 27 mmol/L (ref 22–32)
Calcium: 9.6 mg/dL (ref 8.9–10.3)
Chloride: 97 mmol/L — ABNORMAL LOW (ref 98–111)
Creatinine, Ser: 6.85 mg/dL — ABNORMAL HIGH (ref 0.44–1.00)
GFR, Estimated: 7 mL/min — ABNORMAL LOW (ref >60)
Glucose, Bld: 133 mg/dL — ABNORMAL HIGH (ref 70–99)
Potassium: 3.4 mmol/L — ABNORMAL LOW (ref 3.5–5.1)
Sodium: 137 mmol/L (ref 135–145)

## 2021-12-25 MED ORDER — OXYCODONE-ACETAMINOPHEN 5-325 MG PO TABS: 1.0000 | ORAL_TABLET | Freq: Three times a day (TID) | ORAL | 0 refills | Status: AC | PRN

## 2021-12-25 MED ORDER — OXYCODONE-ACETAMINOPHEN 5-325 MG PO TABS: 1.0000 | ORAL_TABLET | Freq: Three times a day (TID) | ORAL | 0 refills | Status: DC | PRN

## 2021-12-25 NOTE — TOC Transition Note (Signed)
Transition of Care (TOC) - CM/SW Discharge Note ? ? ?Patient Details  ?Name: Tammy Moses ?MRN: [id number] ?Date of Birth: May 11, 1966 ? ?Transition of Care (TOC) CM/SW Contact:  ?Vinie Sill, LCSW ?Phone Number: ?12/25/2021, 2:13 PM ? ? ?Clinical Narrative:    ? ?Patient will Discharge to: [place]  ?Discharge Date: 12/25/2021 ?Family Notified: mother ?Transport EE:FEOF ? ?Per MD patient is ready for discharge. RN, patient, and facility notified of discharge. Discharge Summary sent to facility. RN given number for [phone]. Ambulance transport requested for patient.  ? ?Clinical Social Worker signing off. ? ?Thurmond Butts, MSW, LCSW ?Clinical Social Worker ? ? ? ? ?Final next level of care: [place] ?Barriers to Discharge: Barriers Resolved ? ? ?Patient Goals and CMS Choice ?  ?  ?  ? ?Discharge Placement ?  ?           ?Patient chooses bed at: [facility] ?Patient to be transferred to facility by: [facility] ?Name of family member notified: mother ?Patient and family notified of of transfer: 12/25/21 ? ?Discharge Plan and Services ?In-house Referral: Clinical Social Work ?  ?           ?  ?  ?  ?  ?  ?  ?  ?  ?  ?  ? ?Social Determinants of Health (SDOH) Interventions ?  ? ? ?Readmission Risk Interventions ?Readmission Risk Prevention Plan 10/09/2021  ?Transportation Screening Complete  ?[place] or Home Care Consult Complete  ?SW Recovery Care/Counseling Consult Complete  ?Palliative Care Screening Not Applicable  ?Skilled Nursing Facility Complete  ?Some recent data might be hidden  ? ? ? ? ? ?

## 2021-12-25 NOTE — Progress Notes (Signed)
OT Cancellation Note ? ?Patient Details ?Name: Tammy Moses ?MRN: [id number] ?DOB: 1966/08/01 ? ? ?Cancelled Treatment:    Reason Eval/Treat Not Completed: Patient at procedure or test/ unavailable (HD) ? ?[name] ?12/25/2021, 9:09 AM ?

## 2021-12-25 NOTE — Progress Notes (Signed)
Advised by CSW this am that pt to d/c to snf today. D/C order noted. Contacted [facility] in [facility] to make them aware that pt will d/c today and resume care on Monday. Clinic has acces to [facility] Epic to print d/c summary.  ? ?Melven Sartorius ?Renal Navigator ?[phone] ?

## 2021-12-25 NOTE — Progress Notes (Signed)
Report given to RN at [facility] ?

## 2021-12-25 NOTE — Hospital Course (Signed)
No further headaches ?Slept well ?Has no questions today  ?

## 2021-12-25 NOTE — Discharge Summary (Signed)
Name: Tammy Moses MRN: [id number] DOB: May 16, 1966 56 y.o. PCP: [name], FNP  Date of Admission: 12/21/2021  8:04 AM Date of Discharge: 12/25/2021 Attending Physician: Sid Falcon, MD  Discharge Diagnosis: 1.  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  ESRD on HD on MWF 3.  Severe hypertension  Discharge Medications: Allergies as of 12/25/2021       Reactions   Acyclovir And Related Itching        Medication List     STOP taking these medications    LORazepam 0.5 MG tablet Commonly known as: ATIVAN   QUEtiapine 25 MG tablet Commonly known as: SEROquel       TAKE these medications    acetaminophen 325 MG tablet Commonly known as: TYLENOL Take 2 tablets (650 mg total) by mouth every 4 (four) hours as needed for headache or mild pain.   albuterol 108 (90 Base) MCG/ACT inhaler Commonly known as: VENTOLIN HFA Inhale 2 puffs into the lungs every 4 (four) hours as needed for shortness of breath. For shortness of breath What changed:  reasons to take this additional instructions   amLODipine 10 MG tablet Commonly known as: NORVASC Take 1 tablet (10 mg total) by mouth daily.   aspirin 81 MG EC tablet Take 1 tablet (81 mg total) by mouth daily. Swallow whole.   atorvastatin 80 MG tablet Commonly known as: LIPITOR Take 1 tablet (80 mg total) by mouth daily.   atropine 1 % ophthalmic solution Place 1 drop into both eyes 2 (two) times daily. (0900 & 2100)   buPROPion 150 MG 12 hr tablet Commonly known as: WELLBUTRIN SR Take 1 tablet (150 mg total) by mouth daily.   calcium acetate 667 MG capsule Commonly known as: PHOSLO Take 3 capsules (2,001 mg total) by mouth 3 (three) times daily with meals.   dorzolamide-timolol 22.3-6.8 MG/ML ophthalmic solution Commonly known as: COSOPT Place 1 drop into both eyes 2 (two) times daily.   furosemide 40 MG tablet Commonly known as: LASIX Take 40 mg by mouth 2 (two) times daily. (0800 & 2000)    HumaLOG 100 UNIT/ML injection Generic drug: insulin lispro Inject into the skin 3 (three) times daily before meals. Inject as per sliding scale 150-200=2 units 201-250=4 units 251-300=6 units 301-350=8 units 351-400=10 units   hydrALAZINE 100 MG tablet Commonly known as: APRESOLINE Take 1 tablet (100 mg total) by mouth every 8 (eight) hours. What changed:  when to take this additional instructions   HYDROmorphone 2 MG tablet Commonly known as: DILAUDID Take 0.5 tablets (1 mg total) by mouth every 4 (four) hours as needed for severe pain.   labetalol 200 MG tablet Commonly known as: NORMODYNE Take 1 tablet (200 mg total) by mouth 2 (two) times daily.   lamoTRIgine 25 MG tablet Commonly known as: LAMICTAL Take 25 mg by mouth daily.   latanoprost 0.005 % ophthalmic solution Commonly known as: XALATAN Place 1 drop into both eyes at bedtime. (2100)   losartan 100 MG tablet Commonly known as: COZAAR Take 1 tablet (100 mg total) by mouth daily.   metoCLOPramide 10 MG tablet Commonly known as: REGLAN Take 10 mg by mouth in the morning and at bedtime. (0800 & 2000)   mirtazapine 7.5 MG tablet Commonly known as: REMERON Take 7.5 mg by mouth at bedtime. (2100)   multivitamin Tabs tablet Take 1 tablet by mouth at bedtime. What changed:  when to take this additional instructions   oxyCODONE-acetaminophen 5-325  MG tablet Commonly known as: PERCOCET/ROXICET Take 1-2 tablets by mouth every 8 (eight) hours as needed for up to 3 days for moderate pain.   pregabalin 25 MG capsule Commonly known as: LYRICA Take 1 capsule (25 mg total) by mouth at bedtime.   senna-docusate 8.6-50 MG tablet Commonly known as: Senokot-S Take 1 tablet by mouth at bedtime as needed for mild constipation. What changed: when to take this   sevelamer carbonate 800 MG tablet Commonly known as: RENVELA Take 800-2,400 mg by mouth See admin instructions. Take 3 tablets (2400 mg) by mouth 3 time  daily with meals (0800, 1300 & 1700) + Take 1 tablet (800 mg) by mouth as needed for ESRD with snacks.        Disposition and follow-up:   Tammy Moses was discharged from [facility] in Stable condition.  At the hospital follow up visit please address:  1.  Right AKA: Dressing removed by vascular surgery.  Patient being sent home on pain medicine.  Scheduled to follow-up with vascular surgery on April 11th for staple removal.  2.  ESRD on HD MWF: Completed 3 HD sessions during hospitalization.  Will resume outpatient HD on Monday.   3.  Hypertension: BP labile during hospitalization. All home BP meds resumed during hospitalization and at discharge. Check BP and adjust medications at your discretion.  4.  Labs / imaging needed at time of follow-up: None  5.  Pending labs/ test needing follow-up: None  Follow-up Appointments:  Follow-up Information     [facility] Follow up in 1 month(s).   Specialty: Vascular Surgery Contact information: [phone] [place] Hospital Course by problem list: 1.  Right foot gangrene/pain Patient presented to the hospital for right above-knee amputation by vascular surgery after failing multiple vascular interventions on 10/30/2021 and wound dehiscence on 11/14/2021.  Patient had a right AKA by Dr. Carlis Abbott on 12/21/2021.  The pain was managed with as needed IV Dilaudid and oral Percocet.  Dressing on right AKA stump was changed on 3/1.  Patient is scheduled to follow-up with vascular surgery on April 11th for further assessment and removal of sutures.  2.  ESRD on HD MWF Admission labs were remarkable for hyperkalemia at 5.5, increased BUN/Cr to 72/9.50 (baseline appears to be 20s-40s/6-8). Nephrology was consulted and patient received 3 HD sessions on her regular schedule.  She was discharged home after her last HD session on Friday with  plan to continue outpatient HD on Monday.  Hyperkalemia had resolved by day of discharge.  3.  Hypertension Patient found to have significant swings in her blood pressure on admission.  Patient's home BP medications were restarted after SBP increased to over 200s.  SBP improved to the 110s to 120s on day of discharge.  Patient will need close monitoring of BP in the outpatient.  4. Type 2 diabetes mellitus HbA1c 5.5% in January 2023. Chronically managed with humalog SSI.  Patient was placed on sliding scale NovoLog during hospitalization.   5. HLD Chronically managed with atorvastatin 80 mg daily.  Home atorvastatin was continued during hospitalization.  Discharge Exam:   BP (!) 127/50    Pulse 79    Temp 97.7 F (36.5 C) (Oral)    Resp 10    Ht 5\' 1"  (1.549 m)    Wt 43.9 kg    SpO2  100%    BMI 18.29 kg/m   General: Pleasant, chronically ill-appearing middle-aged woman laying in bed. No acute distress. 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 Moist mucous membrane. CV: RRR. No murmurs, rubs, or gallops. No LE edema Pulmonary: Lungs CTAB. Normal effort. No wheezing or rales. Abdominal: Soft, nontender, nondistended. Normal bowel sounds. Extremities: Right AKA. Stump with sutures, incision healing well.  Moderate tenderness to palpation around stump. LUE AVF with palpable thrill. Skin: Warm and dry. No obvious rash or lesions. Neuro: A&Ox3. Moves all extremities. Normal sensation. No focal deficit. Psych: Normal mood and affect  Pertinent Labs, Studies, and Procedures:  BMP Latest Ref Rng & Units 12/25/2021 12/24/2021 12/23/2021  Glucose 70 - 99 mg/dL 133(H) 102(H) 129(H)  BUN 6 - 20 mg/dL 41(H) 26(H) 42(H)  Creatinine 0.44 - 1.00 mg/dL 6.85(H) 4.70(H) 6.66(H)  BUN/Creat Ratio 9 - 23 - - -  Sodium 135 - 145 mmol/L 137 136 137  Potassium 3.5 - 5.1 mmol/L 3.4(L) 3.7 5.3(H)  Chloride 98 - 111 mmol/L 97(L) 95(L) 98  CO2 22 - 32 mmol/L 27 25 26   Calcium 8.9 - 10.3 mg/dL 9.6 9.2 9.3   CBC Latest Ref  Rng & Units 12/25/2021 12/24/2021 12/23/2021  WBC 4.0 - 10.5 K/uL 9.6 10.8(H) 9.6  Hemoglobin 12.0 - 15.0 g/dL 10.8(L) 11.2(L) 10.6(L)  Hematocrit 36.0 - 46.0 % 35.1(L) 36.0 35.6(L)  Platelets 150 - 400 K/uL 361 357 354    Discharge Instructions: Tammy Moses, It was a pleasure taking care of you at [place] [facility] were admitted to get your right above-knee amputation and also receive dialysis during your hospitalization. We are discharging you back to [facility] now that you are doing better. Please follow the following instructions.  1) Follow-up with vascular surgery on April 11th 2) Continue your HD sessions on Monday, Wednesday, Friday 3) Continue taking Percocet 5-325 mg every 8 hours as needed for pain  Take care,  Dr. Linwood Dibbles, MD, MPH  Signed: Lacinda Axon, MD 12/25/2021, 11:58 AM

## 2021-12-25 NOTE — Progress Notes (Signed)
?  [facility] ?Progress Note  ? ?Assessment/ Plan:   ? ?OP HD: MWF [facility] ?  3.5h  52kg   350/600   AVF  Hep 1500 then 500u/hr ? - EPO 4200 tiw w/ HD, last 1/20 ? 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   ?2 ESRD: MWF normally- on schedule.  HD today 12/25/21 ?3 Hypertension: home meds- BP up and down, labile.   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  Nutrition: renal/ carb modified ?7. Dispo: SNF ? ?Subjective:   ? ?Seen and examined on HD.  BFR 400 mL/ min via L UE AVF, UF goal 1.5L.  No complaints.  Says she is sleepy.    ? ?Objective:   ?BP 131/63   Pulse 76   Temp 97.7 ?F (36.5 ?C) (Oral)   Resp 12   Ht 5\' 1"  (1.549 m)   Wt 43.9 kg   SpO2 100%   BMI 18.29 kg/m?  ? ?Physical Exam: ?GEN NAD,  ?HEENT R eye clouded ?NECK no JVD ?PULM clear ?CV RRR ?ABD soft ?EXT s/p R AKA, in dressing, no LLE edema ?NEURO Aao x 3 ?ACCESS LUE AVF + T/B ? ?Labs: ?BMET ?Recent Labs  ?Lab 12/21/21 ?0454 12/21/21 ?1823 12/22/21 ?0101 12/22/21 ?0981 12/23/21 ?0154 12/24/21 ?0141 12/25/21 ?0141  ?NA 142 139 139 139 137 136 137  ?K 5.5* 6.4* 5.3* 3.8 5.3* 3.7 3.4*  ?CL 104 102 100 96* 98 95* 97*  ?CO2  --  21* 24 28 26 25 27   ?GLUCOSE 107* 157* 141* 103* 129* 102* 133*  ?BUN 72* 62* 66* 26* 42* 26* 41*  ?CREATININE 9.50* 9.19* 9.48* 5.24* 6.66* 4.70* 6.85*  ?CALCIUM  --  10.2 9.8 9.2 9.3 9.2 9.6  ?PHOS  --  6.1* 6.6*  --   --   --   --   ? ?CBC ?Recent Labs  ?Lab 12/22/21 ?0109 12/23/21 ?0154 12/24/21 ?0141 12/25/21 ?0141  ?WBC 13.4* 9.6 10.8* 9.6  ?NEUTROABS 11.8*  --   --   --   ?HGB 11.2* 10.6* 11.2* 10.8*  ?HCT 35.9* 35.6* 36.0 35.1*  ?MCV 87.3 87.9 86.3 85.8  ?PLT 496* 354 357 361  ? ? ?  ?Medications:   ? ? amLODipine  10 mg Oral Daily  ? atorvastatin  80 mg Oral Daily  ? Chlorhexidine Gluconate Cloth  6 each Topical Q0600  ? docusate sodium  100 mg Oral BID  ? furosemide  40 mg Oral BID  ? hydrALAZINE  100 mg Oral TID  ?  labetalol  200 mg Oral BID  ? losartan  100 mg Oral Daily  ? polyethylene glycol  17 g Oral Daily  ? ? ? ?Madelon Lips, MD ?12/25/2021, 11:51 AM   ?

## 2021-12-25 NOTE — Discharge Instructions (Addendum)
Ms. Keough, ?It was a pleasure taking care of you at [facility] were admitted to get your right above-knee amputation and also receive dialysis during your hospitalization. We are discharging you back to [facility] now that you are doing better. Please follow the following instructions.  ?1) Follow-up with vascular surgery on April 11th ?2) Continue your HD sessions on Monday, Wednesday, Friday ?3) Continue taking Percocet 5-325 mg every 8 hours as needed for pain ? ?Take care,  ?Dr. Linwood Dibbles, MD, MPH ? ?

## 2021-12-25 NOTE — Progress Notes (Signed)
Nasal swab collected in HD bay and sent to lab. ?

## 2021-12-25 NOTE — Care Management Important Message (Signed)
Important Message ? ?Patient Details  ?Name: Tammy Moses ?MRN: [id number] ?Date of Birth: [birth year] ? ? ?Medicare Important Message Given:  Yes ? ? ? ? ?[name] ?12/25/2021, 10:39 AM ?

## 2021-12-25 NOTE — Progress Notes (Addendum)
?  Progress Note ? ? ? ?12/25/2021 ?7:34 AM ?4 Days Post-Op ? ?Subjective:  no new complaints ? ? ?Vitals:  ? 12/24/21 2311 12/25/21 0318  ?BP: (!) 199/59 116/62  ?Pulse: 81 77  ?Resp: 18 18  ?Temp: 98.7 ?F (37.1 ?C) 97.8 ?F (36.6 ?C)  ?SpO2: 97% 100%  ? ? ?Physical Exam: ?Incisions:  R AKA incision healing well ? ? ?CBC ?   ?Component Value Date/Time  ? WBC 9.6 12/25/2021 0141  ? RBC 4.09 12/25/2021 0141  ? HGB 10.8 (L) 12/25/2021 0141  ? HGB 12.4 12/13/2017 1416  ? HCT 35.1 (L) 12/25/2021 0141  ? HCT 37.8 12/13/2017 1416  ? PLT 361 12/25/2021 0141  ? PLT 254 12/13/2017 1416  ? MCV 85.8 12/25/2021 0141  ? MCV 83 12/13/2017 1416  ? MCH 26.4 12/25/2021 0141  ? MCHC 30.8 12/25/2021 0141  ? RDW 16.8 (H) 12/25/2021 0141  ? RDW 15.8 (H) 12/13/2017 1416  ? LYMPHSABS 1.0 12/22/2021 0109  ? LYMPHSABS 4.1 (H) 12/13/2017 1416  ? MONOABS 0.6 12/22/2021 0109  ? EOSABS 0.0 12/22/2021 0109  ? EOSABS 0.3 12/13/2017 1416  ? BASOSABS 0.1 12/22/2021 0109  ? BASOSABS 0.1 12/13/2017 1416  ? ? ?BMET ?   ?Component Value Date/Time  ? NA 137 12/25/2021 0141  ? NA 146 (H) 05/10/2017 0950  ? K 3.4 (L) 12/25/2021 0141  ? CL 97 (L) 12/25/2021 0141  ? CO2 27 12/25/2021 0141  ? GLUCOSE 133 (H) 12/25/2021 0141  ? BUN 41 (H) 12/25/2021 0141  ? BUN 38 (H) 05/10/2017 0950  ? CREATININE 6.85 (H) 12/25/2021 0141  ? CREATININE 3.40 (H) 03/11/2017 1505  ? CALCIUM 9.6 12/25/2021 0141  ? GFRNONAA 7 (L) 12/25/2021 0141  ? GFRNONAA 15 (L) 03/11/2017 1505  ? GFRAA 5 (L) 05/05/2020 1314  ? GFRAA 17 (L) 03/11/2017 1505  ? ? ?INR ?   ?Component Value Date/Time  ? INR 1.2 10/21/2021 1425  ? ? ? ?Intake/Output Summary (Last 24 hours) at 12/25/2021 0734 ?Last data filed at 12/25/2021 0320 ?Gross per 24 hour  ?Intake 240 ml  ?Output 0 ml  ?Net 240 ml  ? ? ? ?Assessment/Plan:  56 y.o. female is s/p R above knee amputation  4 Days Post-Op ? ?- Incision healing well ?- Ok for discharge from vascular standpoint ?- Office will arrange follow up for staple removal in 1  month ? ? ?Dagoberto Ligas, PA-C ?[facility] ?[phone] ?12/25/2021 ?7:34 AM ? ? ?I have seen and evaluated the patient. I agree with the PA note as documented above. Right AKA stump looks good.  Staples stay 4 weeks. ? ?[name], MD ?[facility] ?Office: [phone] ? ? ? ?

## 2021-12-26 LAB — BASIC METABOLIC PANEL
Anion gap: 14 (ref 5–15)
BUN: 30 mg/dL — ABNORMAL HIGH (ref 6–20)
CO2: 24 mmol/L (ref 22–32)
Calcium: 10.1 mg/dL (ref 8.9–10.3)
Chloride: 100 mmol/L (ref 98–111)
Creatinine, Ser: 5.74 mg/dL — ABNORMAL HIGH (ref 0.44–1.00)
GFR, Estimated: 8 mL/min — ABNORMAL LOW (ref >60)
Glucose, Bld: 108 mg/dL — ABNORMAL HIGH (ref 70–99)
Potassium: 3.5 mmol/L (ref 3.5–5.1)
Sodium: 138 mmol/L (ref 135–145)

## 2021-12-26 LAB — CBC
HCT: 35.7 % — ABNORMAL LOW (ref 36.0–46.0)
Hemoglobin: 10.7 g/dL — ABNORMAL LOW (ref 12.0–15.0)
MCH: 26.2 pg (ref 26.0–34.0)
MCHC: 30 g/dL (ref 30.0–36.0)
MCV: 87.3 fL (ref 80.0–100.0)
Platelets: 351 10*3/uL (ref 150–400)
RBC: 4.09 MIL/uL (ref 3.87–5.11)
RDW: 16.9 % — ABNORMAL HIGH (ref 11.5–15.5)
WBC: 8 10*3/uL (ref 4.0–10.5)
nRBC: 0 % (ref 0.0–0.2)

## 2021-12-26 NOTE — Progress Notes (Signed)
Patient seen and examined this morning prior to discharge ?Granulation tissue appreciated at the right-sided groin incision.  This was removed.  Dry dressing placed.  No concern for wound healing issues at this time. ? ?Patient has scheduled follow-up with our office, ? ?Broadus John MD ?

## 2021-12-26 NOTE — TOC Progression Note (Signed)
Transition of Care (TOC) - Progression Note  ? ? ?Patient Details  ?Name: Tammy Moses ?MRN: [id number] ?Date of Birth: 04/16/1966 ? ?Transition of Care (TOC) CM/SW Contact  ?Bary Castilla, LCSW ?Phone Number: [phone] ?12/26/2021, 9:41 AM ? ?Clinical Narrative:    ? ?Pt's discharge was delayed to today. CSW attempted to call admission staff however had to leave message. CSW will continue to follow for dc today. ? ?TOC team will continue to assist with discharge planning needs.  ? ?Expected Discharge Plan: [place] ?Barriers to Discharge: Barriers Resolved ? ?Expected Discharge Plan and Services ?Expected Discharge Plan: [place] ?In-house Referral: Clinical Social Work ?  ?  ?  ?Expected Discharge Date: 12/25/21               ?  ?  ?  ?  ?  ?  ?  ?  ?  ?  ? ? ?Social Determinants of Health (SDOH) Interventions ?  ? ?Readmission Risk Interventions ?Readmission Risk Prevention Plan 10/09/2021  ?Transportation Screening Complete  ?[place] or Home Care Consult Complete  ?SW Recovery Care/Counseling Consult Complete  ?Palliative Care Screening Not Applicable  ?Skilled Nursing Facility Complete  ?Some recent data might be hidden  ? ? ?

## 2021-12-26 NOTE — Progress Notes (Signed)
Physical Therapy Treatment ?Patient Details ?Name: Tammy Moses ?MRN: [id number] ?DOB: 1966-05-31 ?Today's Date: 12/26/2021 ? ? 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 COPD/asthma ? ?  ?PT Comments  ? ? Focused session on educating and directing pt on post AKA HEP and positioning. Handout provided so that facility can direct her on HEP. Pt tends to hold leg in flexion, needing cues to let it rest down on bed to reduce risk for contractures, but poor carryover. Pt rolls better to L than R due to R leg pain, needing hand over hand guidance to grab bed rails. Will continue to follow acutely. Current recommendations remain appropriate. ? ?  ?Recommendations for follow up therapy are one component of a multi-disciplinary discharge planning process, led by the attending physician.  Recommendations may be updated based on patient status, additional functional criteria and insurance authorization. ? ?Follow Up Recommendations ? Skilled nursing-short term rehab (<3 hours/day) ?  ?  ?Assistance Recommended at Discharge Frequent or constant Supervision/Assistance  ?Patient can return home with the following A lot of help with bathing/dressing/bathroom;A lot of help with walking and/or transfers;Two people to help with walking and/or transfers;Direct supervision/assist for medications management;Assist for transportation;Assistance with cooking/housework;Direct supervision/assist for financial management ?  ?Equipment Recommendations ? None recommended by PT  ?  ?Recommendations for Other Services   ? ? ?  ?Precautions / Restrictions Precautions ?Precautions: Fall ?Precaution Comments: shrinker for R AKA ?Restrictions ?Weight Bearing Restrictions: Yes ?RLE Weight Bearing: Non weight bearing  ?  ? ?Mobility ? Bed Mobility ?Overal bed mobility: Needs Assistance ?Bed Mobility:  Rolling ?Rolling: Mod assist, Max assist ?  ?  ?  ?  ?General bed mobility comments: Hand over hand cues provided to reach UEs for rails to pull. When trying to roll to R pt resisted due to pain, despite cues for her to rotate her hips to her R, needing maxA to complete briefly before she rolled back. ModA to roll to the L. ?  ? ?Transfers ?  ?  ?  ?  ?  ?  ?  ?  ?  ?General transfer comment: deferred ?  ? ?Ambulation/Gait ?  ?  ?  ?  ?  ?  ?  ?General Gait Details: deferred ? ? ?Stairs ?  ?  ?  ?  ?  ? ? ?Wheelchair Mobility ?  ? ?Modified Rankin (Stroke Patients Only) ?  ? ? ?  ?Balance   ?  ?  ?  ?  ?  ?  ?  ?  ?  ?  ?  ?  ?  ?  ?  ?  ?  ?  ?  ? ?  ?Cognition Arousal/Alertness: Awake/alert ?Behavior During Therapy: Restless, Flat affect, Impulsive ?Overall Cognitive Status: Impaired/Different from baseline ?  ?  ?  ?  ?  ?  ?  ?  ?  ?  ?  ?  ?  ?  ?  ?  ?General Comments: Pt impulsive, stating "ok I'm getting up", seeming a bit unaware of her deficits. Pt with STM deficits forgetting cues for tasks to perform. Needs hand over hand cues to direct her due to visual deficits. ?  ?  ? ?  ?Exercises Amputee Exercises ?Quad Sets: AROM, Other reps (comment), Supine, Right, Strengthening (~2-5 reps, unsure if pt following cues as  easily distracted and limited by pain) ?Gluteal Sets: AROM, Strengthening, Right, Other reps (comment), Supine (~2-5 reps, unsure if pt following cues as easily distracted and limited by pain) ?Hip ABduction/ADduction: AROM, Strengthening, Both, Other reps (comment), Supine (x4) ?Hip Flexion/Marching: AROM, Right, Strengthening, Other reps (comment), Supine (>5 reps) ? ?  ?General Comments General comments (skin integrity, edema, etc.): pt holds hip in flexion; educated pt on letting hip extend and lay leg flat on bed to reduce risk of contractures, poor compliance; applied shrinker to leg as was not on upon arrival; educated pt and provided s/p amputation HEP handout to be taken to facility for  them to use to direct her ?  ?  ? ?Pertinent Vitals/Pain Pain Assessment ?Pain Assessment: Faces ?Faces Pain Scale: Hurts even more ?Pain Location: right residual limb ?Pain Descriptors / Indicators: Discomfort, Grimacing, Operative site guarding ?Pain Intervention(s): Limited activity within patient's tolerance, Monitored during session, Repositioned  ? ? ?Home Living   ?  ?  ?  ?  ?  ?  ?  ?  ?  ?   ?  ?Prior Function    ?  ?  ?   ? ?PT Goals (current goals can now be found in the care plan section) Acute Rehab PT Goals ?Patient Stated Goal: to have BM ?PT Goal Formulation: Patient unable to participate in goal setting ?Time For Goal Achievement: 01/05/22 ?Potential to Achieve Goals: Fair ?Progress towards PT goals: Progressing toward goals ? ?  ?Frequency ? ? ? Min 2X/week ? ? ? ?  ?PT Plan Current plan remains appropriate  ? ? ?Co-evaluation   ?  ?  ?  ?  ? ?  ?AM-PAC PT "6 Clicks" Mobility   ?Outcome Measure ? Help needed turning from your back to your side while in a flat bed without using bedrails?: A Lot ?Help needed moving from lying on your back to sitting on the side of a flat bed without using bedrails?: Total ?Help needed moving to and from a bed to a chair (including a wheelchair)?: Total ?Help needed standing up from a chair using your arms (e.g., wheelchair or bedside chair)?: Total ?Help needed to walk in hospital room?: Total ?Help needed climbing 3-5 steps with a railing? : Total ?6 Click Score: 7 ? ?  ?End of Session   ?Activity Tolerance: Patient limited by pain ?Patient left: in bed;with call bell/phone within reach;with bed alarm set ?  ?PT Visit Diagnosis: Pain;Muscle weakness (generalized) (M62.81);Difficulty in walking, not elsewhere classified (R26.2);History of falling (Z91.81) ?Pain - Right/Left: Right ?Pain - part of body: Leg ?  ? ? ?Time: 9892-1194 ?PT Time Calculation (min) (ACUTE ONLY): 21 min ? ?Charges:  $Therapeutic Exercise: 8-22 mins          ?          ? ?Moishe Spice, PT,  DPT ?Acute Rehabilitation Services  ?Pager: [phone] ?Office: [phone] ? ? ? ?Tammy Moses ?12/26/2021, 2:12 PM ? ?

## 2021-12-26 NOTE — TOC Progression Note (Signed)
Transition of Care (TOC) - Progression Note  ? ? ?Patient Details  ?Name: Tammy Moses ?MRN: [id number] ?Date of Birth: September 08, 1966 ? ?Transition of Care (TOC) CM/SW Contact  ?Bary Castilla, LCSW ?Phone [phone] ?12/26/2021, 10:59 AM ? ?Clinical Narrative:    ?CSW attempted to call admission staff however did not reach anyone. CSW called facility directly and spoke with Levada Dy at front dest. She informed CSW that she would reach out to RN on call and give CSW a call back. ? ?TOC team will continue to assist with discharge planning needs.  ? ? ?Expected Discharge Plan: [place] ?Barriers to Discharge: Barriers Resolved ? ?Expected Discharge Plan and Services ?Expected Discharge Plan: [place] ?In-house Referral: Clinical Social Work ?  ?  ?  ?Expected Discharge Date: 12/25/21               ?  ?  ?  ?  ?  ?  ?  ?  ?  ?  ? ? ?Social Determinants of Health (SDOH) Interventions ?  ? ?Readmission Risk Interventions ?Readmission Risk Prevention Plan 10/09/2021  ?Transportation Screening Complete  ?[place] or Home Care Consult Complete  ?SW Recovery Care/Counseling Consult Complete  ?Palliative Care Screening Not Applicable  ?Skilled Nursing Facility Complete  ?Some recent data might be hidden  ? ? ?

## 2021-12-26 NOTE — TOC Transition Note (Signed)
Transition of Care (TOC) - CM/SW Discharge Note ? ? ?Patient Details  ?Name: Tammy Moses ?MRN: [id number] ?Date of Birth: 05/19/1966 ? ?Transition of Care (TOC) CM/SW Contact:  ?Bary Castilla, LCSW ?Phone Number: [phone] ?12/26/2021, 12:03 PM ? ? ?Clinical Narrative:    ? ?Patient will DC to:?[facility] ?Anticipated DC date:?3.4.2023 ?Family notified:?Mother- left VM ?Transport by: Corey Harold ?  ?Pt's DC delayed yesterday.Per MD patient ready for DC to [place], patient, patient's family, and facility notified of DC. Discharge Summary sent to facility via hard copy. Facility reports that call to report was given yesterday and they are not requesting another call. DC packet on chart. Ambulance transport requested for patient.  ? ?CSW signing off. ?  ?Vallery Ridge, LCSW ?[phone]  ? ?Final next level of care: [place] ?Barriers to Discharge: Barriers Resolved ? ? ?Patient Goals and CMS Choice ?  ?  ?  ? ?Discharge Placement ?  ?           ?Patient chooses bed at: [facility] ?Patient to be transferred to facility by: [facility] ?Name of family member notified: mother ?Patient and family notified of of transfer: 12/25/21 ? ?Discharge Plan and Services ?In-house Referral: Clinical Social Work ?  ?           ?  ?  ?  ?  ?  ?  ?  ?  ?  ?  ? ?Social Determinants of Health (SDOH) Interventions ?  ? ? ?Readmission Risk Interventions ?Readmission Risk Prevention Plan 10/09/2021  ?Transportation Screening Complete  ?[place] or Home Care Consult Complete  ?SW Recovery Care/Counseling Consult Complete  ?Palliative Care Screening Not Applicable  ?Skilled Nursing Facility Complete  ?Some recent data might be hidden  ? ? ? ? ? ?

## 2021-12-26 NOTE — Progress Notes (Addendum)
Patient was getting bath and N.T. notice right groin wound small opening at site Looks like it is dehisced again small in size. Small amt. Of red tissue appearing. Tim.R.N. and Wyman Songster.N. wound care nurse into assess. Called Dr.Zinoview and made aware and will come see patient. ?

## 2021-12-26 NOTE — Progress Notes (Addendum)
[facility] was paged by Tammy Moses's LPN for wound dehisces. Upon arrival to Tammy Moses's room, patient was sitting comfortably in bed. She denies any groin pain, but does endorse pain in her RL amputation site.  ? ?Physical Examination:  ?Physical Exam ?Constitutional:   ?   General: She is not in acute distress. ?   Appearance: She is not ill-appearing or toxic-appearing.  ?   Comments: Resting comfortably in bed, answering questions appropriately.   ?Musculoskeletal:  ?   Comments: RLE AKA   ?Skin: ?   Comments: site with no erythema, purulence, drainage, dehiscence.  ?Right medial thigh with approximately 2 cm of wound dehiscence with granulation tissue at the inner R thigh with no erythema, drainage, or purulence noted. Please see photo in media tab.  ?Neurological:  ?   Mental Status: She is alert.  ?Psychiatric:     ?   Behavior: Behavior normal.  ?  ? ?Discussed findings with staff at bedside. Patient is currently stable and in no acute distress at this time, and does not need urgent/emergent intervention. Will rescind DC orders and discuss with the day team. Case further discussed with O/N attending who is in agreement.  ?Maudie Mercury, MD ?[facility], PGY-3 ?12/26/2021,3:09 AM  ?

## 2021-12-26 NOTE — Progress Notes (Signed)
Dr. Virl Cagey called and made aware of right groin incision small area of dehiscence. Dr. Virl Cagey will assess on rounds this a.m. ?

## 2021-12-26 NOTE — Progress Notes (Signed)
?  [facility] ?Progress Note  ? ?Assessment/ Plan:   ? ?OP HD: MWF [facility] ?  3.5h  52kg   350/600   AVF  Hep 1500 then 500u/hr ? - EPO 4200 tiw w/ HD, last 1/20 ? 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   ?2 ESRD: MWF normally- on schedule.  HD today 12/25/21 ?3 Hypertension: home meds- BP up and down, labile.   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  Nutrition: renal/ carb modified ?7. Dispo: SNF ? ?Subjective:   ? ?Seen and examined in room.  For d/c today.  Having some pain but says her pills help he control it.    ? ?Objective:   ?BP (!) 188/57 (BP Location: Right Arm)   Pulse 78   Temp 98.3 ?F (36.8 ?C) (Oral)   Resp 18   Ht 5\' 1"  (1.549 m)   Wt 41.9 kg   SpO2 100%   BMI 17.45 kg/m?  ? ?Physical Exam: ?GEN NAD, lying flat in bed ?HEENT R eye clouded ?NECK no JVD ?PULM clear ?CV RRR ?ABD soft ?EXT s/p R AKA, staples intact, groin area dressed, no LLE edema ?NEURO Aao x 3 ?ACCESS LUE AVF + T/B ? ?Labs: ?BMET ?Recent Labs  ?Lab 12/21/21 ?2641 12/21/21 ?1823 12/22/21 ?0101 12/22/21 ?5830 12/23/21 ?0154 12/24/21 ?0141 12/25/21 ?0141  ?NA 142 139 139 139 137 136 137  ?K 5.5* 6.4* 5.3* 3.8 5.3* 3.7 3.4*  ?CL 104 102 100 96* 98 95* 97*  ?CO2  --  21* 24 28 26 25 27   ?GLUCOSE 107* 157* 141* 103* 129* 102* 133*  ?BUN 72* 62* 66* 26* 42* 26* 41*  ?CREATININE 9.50* 9.19* 9.48* 5.24* 6.66* 4.70* 6.85*  ?CALCIUM  --  10.2 9.8 9.2 9.3 9.2 9.6  ?PHOS  --  6.1* 6.6*  --   --   --   --   ? ?CBC ?Recent Labs  ?Lab 12/22/21 ?0109 12/23/21 ?0154 12/24/21 ?0141 12/25/21 ?0141  ?WBC 13.4* 9.6 10.8* 9.6  ?NEUTROABS 11.8*  --   --   --   ?HGB 11.2* 10.6* 11.2* 10.8*  ?HCT 35.9* 35.6* 36.0 35.1*  ?MCV 87.3 87.9 86.3 85.8  ?PLT 496* 354 357 361  ? ? ?  ?Medications:   ? ? amLODipine  10 mg Oral Daily  ? atorvastatin  80 mg Oral Daily  ? Chlorhexidine Gluconate Cloth  6 each Topical Q0600  ? docusate sodium  100 mg Oral BID  ?  furosemide  40 mg Oral BID  ? hydrALAZINE  100 mg Oral TID  ? labetalol  200 mg Oral BID  ? losartan  100 mg Oral Daily  ? polyethylene glycol  17 g Oral Daily  ? ? ? ?Madelon Lips, MD ?12/26/2021, 8:42 AM   ?

## 2021-12-26 NOTE — Discharge Summary (Signed)
Name: Tammy Moses MRN: [id number] DOB: [birth year] 56 y.o. PCP: [name], FNP  Date of Admission: 12/21/2021  8:04 AM Date of Discharge: 12/26/2021 Attending Physician: Sid Falcon, MD  Discharge Diagnosis: 1.  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  ESRD on HD on MWF 3.  Severe hypertension  Discharge Medications: Allergies as of 12/26/2021       Reactions   Acyclovir And Related Itching        Medication List     STOP taking these medications    LORazepam 0.5 MG tablet Commonly known as: ATIVAN   QUEtiapine 25 MG tablet Commonly known as: SEROquel       TAKE these medications    acetaminophen 325 MG tablet Commonly known as: TYLENOL Take 2 tablets (650 mg total) by mouth every 4 (four) hours as needed for headache or mild pain.   albuterol 108 (90 Base) MCG/ACT inhaler Commonly known as: VENTOLIN HFA Inhale 2 puffs into the lungs every 4 (four) hours as needed for shortness of breath. For shortness of breath What changed:  reasons to take this additional instructions   amLODipine 10 MG tablet Commonly known as: NORVASC Take 1 tablet (10 mg total) by mouth daily.   aspirin 81 MG EC tablet Take 1 tablet (81 mg total) by mouth daily. Swallow whole.   atorvastatin 80 MG tablet Commonly known as: LIPITOR Take 1 tablet (80 mg total) by mouth daily.   atropine 1 % ophthalmic solution Place 1 drop into both eyes 2 (two) times daily. (0900 & 2100)   buPROPion 150 MG 12 hr tablet Commonly known as: WELLBUTRIN SR Take 1 tablet (150 mg total) by mouth daily.   calcium acetate 667 MG capsule Commonly known as: PHOSLO Take 3 capsules (2,001 mg total) by mouth 3 (three) times daily with meals.   dorzolamide-timolol 22.3-6.8 MG/ML ophthalmic solution Commonly known as: COSOPT Place 1 drop into both eyes 2 (two) times daily.   furosemide 40 MG tablet Commonly known as: LASIX Take 40 mg by mouth 2 (two) times daily. (0800 & 2000)    HumaLOG 100 UNIT/ML injection Generic drug: insulin lispro Inject into the skin 3 (three) times daily before meals. Inject as per sliding scale 150-200=2 units 201-250=4 units 251-300=6 units 301-350=8 units 351-400=10 units   hydrALAZINE 100 MG tablet Commonly known as: APRESOLINE Take 1 tablet (100 mg total) by mouth every 8 (eight) hours. What changed:  when to take this additional instructions   HYDROmorphone 2 MG tablet Commonly known as: DILAUDID Take 0.5 tablets (1 mg total) by mouth every 4 (four) hours as needed for severe pain.   labetalol 200 MG tablet Commonly known as: NORMODYNE Take 1 tablet (200 mg total) by mouth 2 (two) times daily.   lamoTRIgine 25 MG tablet Commonly known as: LAMICTAL Take 25 mg by mouth daily.   latanoprost 0.005 % ophthalmic solution Commonly known as: XALATAN Place 1 drop into both eyes at bedtime. (2100)   losartan 100 MG tablet Commonly known as: COZAAR Take 1 tablet (100 mg total) by mouth daily.   metoCLOPramide 10 MG tablet Commonly known as: REGLAN Take 10 mg by mouth in the morning and at bedtime. (0800 & 2000)   mirtazapine 7.5 MG tablet Commonly known as: REMERON Take 7.5 mg by mouth at bedtime. (2100)   multivitamin Tabs tablet Take 1 tablet by mouth at bedtime. What changed:  when to take this additional instructions   oxyCODONE-acetaminophen 5-325  MG tablet Commonly known as: PERCOCET/ROXICET Take 1-2 tablets by mouth every 8 (eight) hours as needed for up to 3 days for moderate pain.   pregabalin 25 MG capsule Commonly known as: LYRICA Take 1 capsule (25 mg total) by mouth at bedtime.   senna-docusate 8.6-50 MG tablet Commonly known as: Senokot-S Take 1 tablet by mouth at bedtime as needed for mild constipation. What changed: when to take this   sevelamer carbonate 800 MG tablet Commonly known as: RENVELA Take 800-2,400 mg by mouth See admin instructions. Take 3 tablets (2400 mg) by mouth 3 time  daily with meals (0800, 1300 & 1700) + Take 1 tablet (800 mg) by mouth as needed for ESRD with snacks.        Disposition and follow-up:   Ms.Lecretia R Fehrenbach was discharged from [facility] in Stable condition.  At the hospital follow up visit please address:  1.  Right AKA: Dressing removed by vascular surgery.  Patient being sent home on pain medicine.  Scheduled to follow-up with vascular surgery on April 11th for staple removal.  2.  ESRD on HD MWF: Completed 3 HD sessions during hospitalization.  Will resume outpatient HD on Monday.   3.  Hypertension: BP labile during hospitalization. All home BP meds resumed during hospitalization and at discharge. Check BP and adjust medications at your discretion.  4.  Labs / imaging needed at time of follow-up: None  5.  Pending labs/ test needing follow-up: None  Follow-up Appointments:  Follow-up Information     [facility] Follow up in 1 month(s).   Specialty: Vascular Surgery Contact information: [address] [place] Hospital Course by problem list: 1.  Right foot gangrene/pain Patient presented to the hospital for right above-knee amputation by vascular surgery after failing multiple vascular interventions on 10/30/2021 and wound dehiscence on 11/14/2021. Patient had a right AKA by Dr. Carlis Abbott on 12/21/2021. The pain was managed with as needed IV Dilaudid and oral Percocet. Dressing on right AKA stump was changed on 3/1. Patient is scheduled to follow-up with vascular surgery on April 11th for further assessment and removal of sutures.  2.  ESRD on HD MWF Admission labs were remarkable for hyperkalemia at 5.5, increased BUN/Cr to 72/9.50 (baseline appears to be 20s-40s/6-8). Nephrology was consulted and patient received 3 HD sessions on her regular schedule. She was discharged home after her last HD session on Friday with plan to  continue outpatient HD on Monday.  Hyperkalemia had resolved by day of discharge.  3.  Hypertension Patient found to have significant swings in her blood pressure on admission.  Patient's home BP medications were restarted after SBP increased to over 200s.  SBP improved to the 110s to 120s on day of discharge.  Patient will need close monitoring of BP in the outpatient.  4. Type 2 diabetes mellitus HbA1c 5.5% in January 2023. Chronically managed with humalog SSI. Patient was placed on sliding scale NovoLog during hospitalization.   5. HLD Chronically managed with atorvastatin 80 mg daily. Home atorvastatin was continued during hospitalization.  6.  Right groin wound After hospital discharge on 3/3, RN noticed a right groin wound with small opening. Patient was evaluated by night MD and discharge was canceled. On 3/4, vascular surgery evaluated patient and the granulation tissue on patient's right-sided groin incision was removed and a dry dressing was  placed. There was no concern for wound healing issues.  Discharge Exam:   BP (!) 149/45 (BP Location: Right Arm)    Pulse 73    Temp 98.3 F (36.8 C)    Resp 18    Ht 5\' 1"  (1.549 m)    Wt 41.9 kg    SpO2 97%    BMI 17.45 kg/m   General: Pleasant, chronically ill-appearing middle-aged woman laying in bed. No acute distress. 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 Moist mucous membrane. CV: RRR. No murmurs, rubs, or gallops. No LE edema Pulmonary: Lungs CTAB. Normal effort. No wheezing or rales. Abdominal: Soft, nontender, nondistended. Normal bowel sounds. Extremities: Right AKA. Stump with sutures, incision healing well. Moderate tenderness to palpation around stump. LUE AVF with palpable thrill.  Skin: Warm and dry. No obvious rash or lesions. Right medial thigh wound glued with some granulation tissue but no erythema or drainage. Neuro: A&Ox3. Moves all extremities. Normal sensation. No focal deficit. Psych: Normal mood and affect  Pertinent  Labs, Studies, and Procedures:  BMP Latest Ref Rng & Units 12/26/2021 12/25/2021 12/24/2021  Glucose 70 - 99 mg/dL 108(H) 133(H) 102(H)  BUN 6 - 20 mg/dL 30(H) 41(H) 26(H)  Creatinine 0.44 - 1.00 mg/dL 5.74(H) 6.85(H) 4.70(H)  BUN/Creat Ratio 9 - 23 - - -  Sodium 135 - 145 mmol/L 138 137 136  Potassium 3.5 - 5.1 mmol/L 3.5 3.4(L) 3.7  Chloride 98 - 111 mmol/L 100 97(L) 95(L)  CO2 22 - 32 mmol/L 24 27 25   Calcium 8.9 - 10.3 mg/dL 10.1 9.6 9.2   CBC Latest Ref Rng & Units 12/26/2021 12/25/2021 12/24/2021  WBC 4.0 - 10.5 K/uL 8.0 9.6 10.8(H)  Hemoglobin 12.0 - 15.0 g/dL 10.7(L) 10.8(L) 11.2(L)  Hematocrit 36.0 - 46.0 % 35.7(L) 35.1(L) 36.0  Platelets 150 - 400 K/uL 351 361 357    Discharge Instructions: Discharge Instructions     Diet - low sodium heart healthy   Complete by: As directed    Increase activity slowly   Complete by: As directed    Increase activity slowly   Complete by: As directed    No wound care   Complete by: As directed    No wound care   Complete by: As directed      Ms. Hershey, It was a pleasure taking care of you at [facility] [place] were admitted to get your right above-knee amputation and also receive dialysis during your hospitalization. We are discharging you back to [facility] now that you are doing better. Please follow the following instructions.  1) Follow-up with vascular surgery on April 11th 2) Continue your HD sessions on Monday, Wednesday, Friday 3) Continue taking Percocet 5-325 mg every 8 hours as needed for pain  Take care,  Dr. Linwood Dibbles, MD, MPH  Signed: Lacinda Axon, MD 12/26/2021, 12:20 PM

## 2021-12-29 DIAGNOSIS — I2510 Atherosclerotic heart disease of native coronary artery without angina pectoris: Secondary | ICD-10-CM | POA: Diagnosis not present

## 2021-12-29 DIAGNOSIS — G8929 Other chronic pain: Secondary | ICD-10-CM | POA: Diagnosis not present

## 2021-12-29 DIAGNOSIS — E1140 Type 2 diabetes mellitus with diabetic neuropathy, unspecified: Secondary | ICD-10-CM | POA: Diagnosis not present

## 2022-01-05 DIAGNOSIS — E43 Unspecified severe protein-calorie malnutrition: Secondary | ICD-10-CM | POA: Diagnosis not present

## 2022-01-05 DIAGNOSIS — Z89611 Acquired absence of right leg above knee: Secondary | ICD-10-CM | POA: Diagnosis not present

## 2022-01-12 DIAGNOSIS — S98119D Complete traumatic amputation of unspecified great toe, subsequent encounter: Secondary | ICD-10-CM | POA: Diagnosis not present

## 2022-01-12 DIAGNOSIS — I2510 Atherosclerotic heart disease of native coronary artery without angina pectoris: Secondary | ICD-10-CM | POA: Diagnosis not present

## 2022-01-25 NOTE — Patient Outreach (Signed)
Received a [employer] notification referral. ?I have assigned Raina Mina, RN to call for follow up and determine if there are any Case Management needs.  ?  ?Arville Care, CBCS, CMAA ?[place] Management Assistant ?[place] [facility] ?[phone]   ?

## 2022-01-26 ENCOUNTER — Other Ambulatory Visit: Payer: Self-pay | Admitting: *Deleted

## 2022-01-26 NOTE — Patient Outreach (Signed)
[facility]) Care Management ? ?01/26/2022 ? ?Melvia Heaps ?Aug 02, 1966 ?[id number] ? ? ?Referral Received 4/3 ?Transition of care outreach 4/4 ? ?RN attempted outreach call to the available Epic contacts however unsuccessful and RN unable to leave a HIPAA approved voice message requesting a call back. ? ?RN will reschedule another outreach over the next week for [facility] services. Will also send outreach letter. ? ?Raina Mina, RN ?Care Management Coordinator ?[place] ?Main Office [phone]  ?

## 2022-01-29 ENCOUNTER — Other Ambulatory Visit: Payer: Medicare Other | Admitting: *Deleted

## 2022-01-29 NOTE — Patient Outreach (Signed)
[facility]) Care Management ? ?01/29/2022 ? ?Tammy Moses ?Apr 29, 1966 ?[id number] ? ? ?Transition of Care-Unsuccessful #2 ? ?RN attempted outreach call today however unsuccessful and RN unable to leave a HIPAA approved voice message.  ? ?Will attempt another outreach over the next week for pending [facility] services. ? ?Raina Mina, RN ?Care Management Coordinator ?[place] ?Main Office [phone]  ?

## 2022-02-01 ENCOUNTER — Telehealth: Payer: Self-pay

## 2022-02-01 NOTE — Telephone Encounter (Signed)
Shay from [facility] called wanting to make [place] aware of midline dehiscence, slough, and eschar on surgical site.  Isaias Sakai also relates she is concerned because Rehab advised patient to "press the infection out of her wound daily".  I advised her that patient has an appointment tomorrow and the Dr. Aletha Halim examine her wound. ?

## 2022-02-01 NOTE — Progress Notes (Signed)
?POST OPERATIVE OFFICE NOTE ? ? ? ?CC:  F/u for surgery ? ?HPI:  This is a 56 y.o. 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 Carlis Abbott. This was performed secondary to right foot gangrene with no revascularization options. She was initially at [facility] but her Niece, who is her POA, took her out of the SNF as of last Thursday 4/6 due concern for her care and so she is now at her niece's home. She has been getting [facility] RN to come out 1x/week. RN from [facility] called yesterday with concerns about dehiscence of the right AKA.  ? ?Patients niece presents with her today. She explains that patient has not been cared for while at [facility]. She now is taking care of her at her home. Has concerns about how the right AKA is looking. Says malodorous drainage. Patient herself reports phantom pains in Right AKA. They are also concerned about appearance of LLE. Patient says left foot is hurting constantly. Unable to sleep. They were not given any pain medication when discharged from SNF so she has just been taking Tylenol. She denies any fever. Says she always has chills.  ? ?She presently dialyzes on MWF ? ?Allergies  ?Allergen Reactions  ? Acyclovir And Related Itching  ? ? ?Current Outpatient Medications  ?Medication Sig Dispense Refill  ? acetaminophen (TYLENOL) 325 MG tablet Take 2 tablets (650 mg total) by mouth every 4 (four) hours as needed for headache or mild pain.    ? albuterol (VENTOLIN HFA) 108 (90 Base) MCG/ACT inhaler Inhale 2 puffs into the lungs every 4 (four) hours as needed for shortness of breath. For shortness of breath (Patient taking differently: Inhale 2 puffs into the lungs every 4 (four) hours as needed for shortness of breath or wheezing.) 8 g 11  ? amLODipine (NORVASC) 10 MG tablet Take 1 tablet (10 mg total) by mouth daily.    ? aspirin EC 81 MG EC tablet Take 1 tablet (81 mg total) by mouth daily. Swallow whole. 30 tablet 11  ? atorvastatin (LIPITOR) 80 MG tablet Take 1 tablet (80 mg total) by  mouth daily.    ? atropine 1 % ophthalmic solution Place 1 drop into both eyes 2 (two) times daily. (0900 & 2100)    ? buPROPion (WELLBUTRIN SR) 150 MG 12 hr tablet Take 1 tablet (150 mg total) by mouth daily.    ? calcium acetate (PHOSLO) 667 MG capsule Take 3 capsules (2,001 mg total) by mouth 3 (three) times daily with meals.    ? dorzolamide-timolol (COSOPT) 22.3-6.8 MG/ML ophthalmic solution Place 1 drop into both eyes 2 (two) times daily.    ? furosemide (LASIX) 40 MG tablet Take 40 mg by mouth 2 (two) times daily. (0800 & 2000)    ? hydrALAZINE (APRESOLINE) 100 MG tablet Take 1 tablet (100 mg total) by mouth every 8 (eight) hours. (Patient taking differently: Take 100 mg by mouth 3 (three) times daily. (0800, 1400 & 2200))    ? HYDROmorphone (DILAUDID) 2 MG tablet Take 0.5 tablets (1 mg total) by mouth every 4 (four) hours as needed for severe pain. 30 tablet 0  ? insulin lispro (HUMALOG) 100 UNIT/ML injection Inject into the skin 3 (three) times daily before meals. Inject as per sliding scale ?150-200=2 units ?201-250=4 units ?251-300=6 units ?301-350=8 units ?351-400=10 units    ? labetalol (NORMODYNE) 200 MG tablet Take 1 tablet (200 mg total) by mouth 2 (two) times daily.    ?  lamoTRIgine (LAMICTAL) 25 MG tablet Take 25 mg by mouth daily.    ? latanoprost (XALATAN) 0.005 % ophthalmic solution Place 1 drop into both eyes at bedtime. (2100)    ? losartan (COZAAR) 100 MG tablet Take 1 tablet (100 mg total) by mouth daily.    ? metoCLOPramide (REGLAN) 10 MG tablet Take 10 mg by mouth in the morning and at bedtime. (0800 & 2000)    ? mirtazapine (REMERON) 7.5 MG tablet Take 7.5 mg by mouth at bedtime. (2100)    ? multivitamin (RENA-VIT) TABS tablet Take 1 tablet by mouth at bedtime. (Patient taking differently: Take 1 tablet by mouth daily at 8 pm. (2000))  0  ? pregabalin (LYRICA) 25 MG capsule Take 1 capsule (25 mg total) by mouth at bedtime. 30 capsule 0  ? senna-docusate (SENOKOT-S) 8.6-50 MG tablet Take 1  tablet by mouth at bedtime as needed for mild constipation. (Patient taking differently: Take 1 tablet by mouth daily as needed for mild constipation.)    ? sevelamer carbonate (RENVELA) 800 MG tablet Take 800-2,400 mg by mouth See admin instructions. Take 3 tablets (2400 mg) by mouth 3 time daily with meals (0800, 1300 & 1700) + Take 1 tablet (800 mg) by mouth as needed for ESRD with snacks.    ? ?No current facility-administered medications for this visit.  ? ? ? ROS:  See HPI ? ?Physical Exam: ? ?Vitals:  ? 02/02/22 1230  ?BP: (!) 185/74  ?Pulse: 80  ?Resp: 18  ?Temp: 97.7 ?F (36.5 ?C)  ?TempSrc: Temporal  ?SpO2: 99%  ?Weight: 92 lb (41.7 kg)  ?Height: 5\' 1"  (1.549 m)  ? ?General: chronically ill appearing, in no distress ?Incision:  right AKA with purulent drainage form staple line. Appears to be superficial. Is very tender. The central aspect of incision has some dehiscence. Flaps otherwise appear viable ? ? ?Extremities:  2+ femoral pulses bilaterally. No palpable distal pulses. Left foot cool. Very tender to palpation of left great toe. Ischemic changes to the left great toe. Black spot on plantar surface of toe. Dorsal medial aspect of toe erythematous ? ? ? ? ?Neuro: alert and oriented ? ? ?Assessment/Plan:  This is a 56 y.o. female who is s/p:right AKA on 12/21/21 by Dr. Carlis Abbott.Her right AKA still has staples. The central aspect of the wound has dehiscence. I did not remove staples because I think the wound would just fall apart. I discussed diligent cleaning and wound care with Niece vs. Debridement in OR. She would prefer to have it cleaned out at this time. I do not think it needs extensive debridement but will try to arrange this at time she has her Angiogram.  ?- I am concerned about her left lower extremity. She has rest pain and ischemic changes of the left great toe. She has no palpable distal pulses. At time of her last Angiogram no LLE images were taken due to the sheath being occlusive.  ?- She is  at high risk for LLE limb loss. The patient and her niece understand this ?- Gave instructions to niece to wash staple line with mild soap and water and pat dry. Okay to wrap in gauze or leave open to air ?- Sent Prescription for Percocet 5-325 mg #30 no refills to [facility] per patient request ?- I have recommended Aortogram, Arteriogram of BLE with possible LLE intervention ?- She dialyzes on MWF so will arrange this in near future on non dialysis day ? ? ?Karoline Caldwell, PA-C ?  [facility] ?[phone] ? ?Clinic MD:  Dr. Carlis Abbott ?

## 2022-02-02 ENCOUNTER — Other Ambulatory Visit: Payer: Self-pay

## 2022-02-02 ENCOUNTER — Ambulatory Visit (INDEPENDENT_AMBULATORY_CARE_PROVIDER_SITE_OTHER): Payer: Medicare Other | Admitting: Physician Assistant

## 2022-02-02 VITALS — BP 185/74 | HR 80 | Temp 97.7°F | Resp 18 | Ht 61.0 in | Wt 92.0 lb

## 2022-02-02 DIAGNOSIS — I96 Gangrene, not elsewhere classified: Secondary | ICD-10-CM

## 2022-02-02 DIAGNOSIS — I70222 Atherosclerosis of native arteries of extremities with rest pain, left leg: Secondary | ICD-10-CM

## 2022-02-02 DIAGNOSIS — T8189XA Other complications of procedures, not elsewhere classified, initial encounter: Secondary | ICD-10-CM

## 2022-02-02 DIAGNOSIS — S78111A Complete traumatic amputation at level between right hip and knee, initial encounter: Secondary | ICD-10-CM

## 2022-02-02 MED ORDER — OXYCODONE-ACETAMINOPHEN 5-325 MG PO TABS: 1.0000 | ORAL_TABLET | Freq: Four times a day (QID) | ORAL | 0 refills | Status: DC | PRN

## 2022-02-03 ENCOUNTER — Inpatient Hospital Stay (HOSPITAL_COMMUNITY): Payer: Medicare Other | Admitting: Physician Assistant

## 2022-02-03 ENCOUNTER — Encounter (HOSPITAL_COMMUNITY): Payer: Self-pay | Admitting: Vascular Surgery

## 2022-02-03 NOTE — Anesthesia Preprocedure Evaluation (Addendum)
Anesthesia Evaluation  ? ? ?Reviewed: ?Allergy & Precautions, Patient's Chart, lab work & pertinent test results, Unable to perform ROS - Chart review only ? ?Airway ? ? ? ? ? ? ? Dental ? ?(+) Missing, Poor Dentition, Chipped, Dental Advisory Given ?  ?Pulmonary ?asthma , COPD, former smoker,  ?  ? ? ? ? ? ? ? Cardiovascular ?hypertension, Pt. on medications and Pt. on home beta blockers ?+ Peripheral Vascular Disease  ?+ Valvular Problems/Murmurs AS and MR  ? ?Echo 10/2021 ??1. Left ventricular ejection fraction, by estimation, is 60 to 65%. The left ventricle has normal function. The left ventricle has no regional wall motion abnormalities. The left ventricular internal cavity size was mildly dilated. There is mild left ventricular hypertrophy. Left ventricular diastolic parameters are consistent with Grade II diastolic dysfunction (pseudonormalization). Elevated left atrial pressure.  ??2. Right ventricular systolic function is normal. The right ventricular size is normal. There is normal pulmonary artery systolic pressure. The estimated right ventricular systolic pressure is 62.9UTML.  ??3. Right atrial size was mildly dilated.  ??4. Left atrial size was mildly dilated.  ??5. The mitral valve is degenerative. Mild mitral valve regurgitation. Moderate mitral stenosis. The mean mitral valve gradient is 6.0 mmHg at 73bpm. MVA 1.4cm^2 by continuity equation. Moderate mitral annular calcification.  ??6. Tricuspid valve regurgitation is mild to moderate.  ??7. The aortic valve is tricuspid. There is moderate calcification of the aortic valve. Aortic valve regurgitation is not visualized. Mild aortic valve stenosis.  ??8. The inferior vena cava is normal in size with greater than 50% respiratory variability, suggesting right atrial pressure of 3 mmHg.  ?  ?Neuro/Psych ?PSYCHIATRIC DISORDERS Anxiety Depression  Neuromuscular disease   ? GI/Hepatic ?PUD, GERD  ,(+) Hepatitis -, C   ?Endo/Other  ?diabetes ? Renal/GU ?ESRF and DialysisRenal disease  ? ?  ?Musculoskeletal ?negative musculoskeletal ROS ?(+)  ? Abdominal ?  ?Peds ? Hematology ? ?(+) Blood dyscrasia, anemia ,   ?Anesthesia Other Findings ? ? Reproductive/Obstetrics ? ?  ? ? ? ? ? ? ? ? ? ? ? ? ? ?  ?  ? ? ? ? ? ? ? ? ?Anesthesia Physical ? ?Anesthesia Plan ? ?ASA: 3 ? ?Anesthesia Plan: General  ? ?Post-op Pain Management: Tylenol PO (pre-op)*  ? ?Induction: Intravenous ? ?PONV Risk Score and Plan: 4 or greater and Ondansetron, Dexamethasone, Midazolam and Treatment may vary due to age or medical condition ? ?Airway Management Planned: Oral ETT ? ?Additional Equipment: Arterial line ? ?Intra-op Plan:  ? ?Post-operative Plan: Extubation in OR ? ?Informed Consent:  ? ?Plan Discussed with:  ? ?Anesthesia Plan Comments: (Patient's BP ~250s/100s in short stay. Discussed with Dr. Virl Cagey. He stated that the surgery isn't urgent or emergent, and supported delaying surgery for inpatient BP control. I spoke to the patient about admission. She seemed a little sedated. She then became nauseated and vomited ~51ml of clear mucous like fluid. iStat K+ >8.5, BMET sent. Dr. Virl Cagey requested that I contact the hospitalist service for admission. I contacted Florencia Reasons and she will see the patient in preop.)  ? ?Anesthesia Quick Evaluation ? ?

## 2022-02-03 NOTE — Progress Notes (Signed)
PCP - None  ?Cardiologist - n/a ? ?Chest x-ray - 10/01/21 (1V) ?EKG - 10/27/21 ?Stress Test - >12 yrs ago ?ECHO - 10/28/21 ?Cardiac Cath - n/a ? ?ICD Pacemaker/Loop - n/a ? ?Sleep Study -  n/a ?CPAP - none ? ?If your blood sugar is less than 70 mg/dL, you will need to treat for low blood sugar: ?Treat a low blood sugar (less than 70 mg/dL) with ? cup of clear juice (cranberry or apple), 4 glucose tablets, OR glucose gel. ?Recheck blood sugar in 15 minutes after treatment (to make sure it is greater than 70 mg/dL). If your blood sugar is not greater than 70 mg/dL on recheck, call [phone] for further instructions. ? ?Anesthesia review: Yes ? ?STOP now taking any Aspirin (unless otherwise instructed by your surgeon), Aleve, Naproxen, Ibuprofen, Motrin, Advil, Goody's, BC's, all herbal medications, fish oil, and all vitamins.  ? ?Coronavirus Screening ?Does the patient have any of the following symptoms:  ?Cough yes/no: No ?Fever (>100.72F)  yes/no: No ?Runny nose yes/no: No ?Sore throat yes/no: No ?Difficulty breathing/shortness of breath  yes/no: No ? ?Have you traveled in the last 14 days and where? yes/no: No ? ?Niece Jesse Fall [phone]) verbalized understanding of instructions that were given via phone. ?

## 2022-02-04 ENCOUNTER — Other Ambulatory Visit: Payer: Self-pay

## 2022-02-04 ENCOUNTER — Encounter (HOSPITAL_COMMUNITY): Payer: Self-pay | Admitting: Vascular Surgery

## 2022-02-04 ENCOUNTER — Inpatient Hospital Stay (HOSPITAL_COMMUNITY)
Admission: AD | Admit: 2022-02-04 | Discharge: 2022-02-19 | DRG: 270 | Disposition: A | Discharge status: Home Health | Payer: Medicare Other | Attending: Internal Medicine | Admitting: Internal Medicine

## 2022-02-04 ENCOUNTER — Encounter (HOSPITAL_COMMUNITY)
Admission: AD | Disposition: A | Discharge status: Home Health | Payer: Self-pay | Source: Home / Self Care | Attending: Family Medicine

## 2022-02-04 DIAGNOSIS — E1152 Type 2 diabetes mellitus with diabetic peripheral angiopathy with gangrene: Secondary | ICD-10-CM | POA: Diagnosis present

## 2022-02-04 DIAGNOSIS — I120 Hypertensive chronic kidney disease with stage 5 chronic kidney disease or end stage renal disease: Secondary | ICD-10-CM | POA: Diagnosis present

## 2022-02-04 DIAGNOSIS — Y835 Amputation of limb(s) as the cause of abnormal reaction of the patient, or of later complication, without mention of misadventure at the time of the procedure: Secondary | ICD-10-CM | POA: Diagnosis present

## 2022-02-04 DIAGNOSIS — Z90710 Acquired absence of both cervix and uterus: Secondary | ICD-10-CM

## 2022-02-04 DIAGNOSIS — Z794 Long term (current) use of insulin: Secondary | ICD-10-CM

## 2022-02-04 DIAGNOSIS — S78111A Complete traumatic amputation at level between right hip and knee, initial encounter: Secondary | ICD-10-CM | POA: Diagnosis not present

## 2022-02-04 DIAGNOSIS — E43 Unspecified severe protein-calorie malnutrition: Secondary | ICD-10-CM

## 2022-02-04 DIAGNOSIS — E871 Hypo-osmolality and hyponatremia: Secondary | ICD-10-CM | POA: Diagnosis not present

## 2022-02-04 DIAGNOSIS — R627 Adult failure to thrive: Secondary | ICD-10-CM | POA: Diagnosis present

## 2022-02-04 DIAGNOSIS — E440 Moderate protein-calorie malnutrition: Secondary | ICD-10-CM | POA: Diagnosis not present

## 2022-02-04 DIAGNOSIS — Z66 Do not resuscitate: Secondary | ICD-10-CM | POA: Diagnosis not present

## 2022-02-04 DIAGNOSIS — Z8349 Family history of other endocrine, nutritional and metabolic diseases: Secondary | ICD-10-CM

## 2022-02-04 DIAGNOSIS — L89626 Pressure-induced deep tissue damage of left heel: Secondary | ICD-10-CM | POA: Diagnosis present

## 2022-02-04 DIAGNOSIS — Z89611 Acquired absence of right leg above knee: Secondary | ICD-10-CM

## 2022-02-04 DIAGNOSIS — K5909 Other constipation: Secondary | ICD-10-CM | POA: Diagnosis present

## 2022-02-04 DIAGNOSIS — H547 Unspecified visual loss: Secondary | ICD-10-CM

## 2022-02-04 DIAGNOSIS — G9341 Metabolic encephalopathy: Secondary | ICD-10-CM

## 2022-02-04 DIAGNOSIS — N186 End stage renal disease: Secondary | ICD-10-CM | POA: Diagnosis present

## 2022-02-04 DIAGNOSIS — R410 Disorientation, unspecified: Secondary | ICD-10-CM

## 2022-02-04 DIAGNOSIS — I70222 Atherosclerosis of native arteries of extremities with rest pain, left leg: Secondary | ICD-10-CM | POA: Diagnosis not present

## 2022-02-04 DIAGNOSIS — E11621 Type 2 diabetes mellitus with foot ulcer: Secondary | ICD-10-CM | POA: Diagnosis present

## 2022-02-04 DIAGNOSIS — D631 Anemia in chronic kidney disease: Secondary | ICD-10-CM | POA: Diagnosis present

## 2022-02-04 DIAGNOSIS — E1122 Type 2 diabetes mellitus with diabetic chronic kidney disease: Secondary | ICD-10-CM

## 2022-02-04 DIAGNOSIS — E119 Type 2 diabetes mellitus without complications: Secondary | ICD-10-CM | POA: Diagnosis not present

## 2022-02-04 DIAGNOSIS — Z789 Other specified health status: Secondary | ICD-10-CM | POA: Diagnosis not present

## 2022-02-04 DIAGNOSIS — F32A Depression, unspecified: Secondary | ICD-10-CM | POA: Diagnosis present

## 2022-02-04 DIAGNOSIS — L97529 Non-pressure chronic ulcer of other part of left foot with unspecified severity: Secondary | ICD-10-CM | POA: Diagnosis present

## 2022-02-04 DIAGNOSIS — H543 Unqualified visual loss, both eyes: Principal | ICD-10-CM

## 2022-02-04 DIAGNOSIS — I10 Essential (primary) hypertension: Secondary | ICD-10-CM | POA: Diagnosis not present

## 2022-02-04 DIAGNOSIS — F419 Anxiety disorder, unspecified: Secondary | ICD-10-CM | POA: Diagnosis present

## 2022-02-04 DIAGNOSIS — R1110 Vomiting, unspecified: Secondary | ICD-10-CM | POA: Diagnosis present

## 2022-02-04 DIAGNOSIS — Z7982 Long term (current) use of aspirin: Secondary | ICD-10-CM

## 2022-02-04 DIAGNOSIS — E11319 Type 2 diabetes mellitus with unspecified diabetic retinopathy without macular edema: Secondary | ICD-10-CM | POA: Diagnosis present

## 2022-02-04 DIAGNOSIS — Z681 Body mass index (BMI) 19 or less, adult: Secondary | ICD-10-CM | POA: Diagnosis not present

## 2022-02-04 DIAGNOSIS — E785 Hyperlipidemia, unspecified: Secondary | ICD-10-CM | POA: Diagnosis present

## 2022-02-04 DIAGNOSIS — I70462 Atherosclerosis of autologous vein bypass graft(s) of the extremities with gangrene, left leg: Secondary | ICD-10-CM | POA: Diagnosis not present

## 2022-02-04 DIAGNOSIS — I70262 Atherosclerosis of native arteries of extremities with gangrene, left leg: Secondary | ICD-10-CM | POA: Diagnosis present

## 2022-02-04 DIAGNOSIS — E1140 Type 2 diabetes mellitus with diabetic neuropathy, unspecified: Secondary | ICD-10-CM | POA: Diagnosis present

## 2022-02-04 DIAGNOSIS — I739 Peripheral vascular disease, unspecified: Secondary | ICD-10-CM | POA: Diagnosis present

## 2022-02-04 DIAGNOSIS — Z833 Family history of diabetes mellitus: Secondary | ICD-10-CM

## 2022-02-04 DIAGNOSIS — Z89511 Acquired absence of right leg below knee: Secondary | ICD-10-CM

## 2022-02-04 DIAGNOSIS — Z515 Encounter for palliative care: Secondary | ICD-10-CM | POA: Diagnosis not present

## 2022-02-04 DIAGNOSIS — T8781 Dehiscence of amputation stump: Secondary | ICD-10-CM | POA: Diagnosis present

## 2022-02-04 DIAGNOSIS — I160 Hypertensive urgency: Secondary | ICD-10-CM

## 2022-02-04 DIAGNOSIS — Z79899 Other long term (current) drug therapy: Secondary | ICD-10-CM

## 2022-02-04 DIAGNOSIS — Z992 Dependence on renal dialysis: Secondary | ICD-10-CM

## 2022-02-04 DIAGNOSIS — J449 Chronic obstructive pulmonary disease, unspecified: Secondary | ICD-10-CM | POA: Diagnosis present

## 2022-02-04 DIAGNOSIS — H409 Unspecified glaucoma: Secondary | ICD-10-CM | POA: Diagnosis present

## 2022-02-04 DIAGNOSIS — Z7189 Other specified counseling: Secondary | ICD-10-CM | POA: Diagnosis not present

## 2022-02-04 DIAGNOSIS — K219 Gastro-esophageal reflux disease without esophagitis: Secondary | ICD-10-CM | POA: Diagnosis present

## 2022-02-04 DIAGNOSIS — H5461 Unqualified visual loss, right eye, normal vision left eye: Secondary | ICD-10-CM | POA: Diagnosis present

## 2022-02-04 DIAGNOSIS — Z87891 Personal history of nicotine dependence: Secondary | ICD-10-CM

## 2022-02-04 DIAGNOSIS — I998 Other disorder of circulatory system: Secondary | ICD-10-CM | POA: Diagnosis not present

## 2022-02-04 DIAGNOSIS — I150 Renovascular hypertension: Secondary | ICD-10-CM | POA: Diagnosis not present

## 2022-02-04 DIAGNOSIS — Z888 Allergy status to other drugs, medicaments and biological substances status: Secondary | ICD-10-CM

## 2022-02-04 DIAGNOSIS — I70201 Unspecified atherosclerosis of native arteries of extremities, right leg: Secondary | ICD-10-CM | POA: Diagnosis present

## 2022-02-04 DIAGNOSIS — I161 Hypertensive emergency: Principal | ICD-10-CM | POA: Diagnosis present

## 2022-02-04 DIAGNOSIS — I953 Hypotension of hemodialysis: Secondary | ICD-10-CM | POA: Diagnosis not present

## 2022-02-04 DIAGNOSIS — H5440 Blindness, one eye, unspecified eye: Secondary | ICD-10-CM | POA: Diagnosis present

## 2022-02-04 DIAGNOSIS — N2581 Secondary hyperparathyroidism of renal origin: Secondary | ICD-10-CM | POA: Diagnosis present

## 2022-02-04 DIAGNOSIS — M533 Sacrococcygeal disorders, not elsewhere classified: Secondary | ICD-10-CM | POA: Diagnosis not present

## 2022-02-04 LAB — CBC
HCT: 36.6 % (ref 36.0–46.0)
Hemoglobin: 11 g/dL — ABNORMAL LOW (ref 12.0–15.0)
MCH: 25.7 pg — ABNORMAL LOW (ref 26.0–34.0)
MCHC: 30.1 g/dL (ref 30.0–36.0)
MCV: 85.5 fL (ref 80.0–100.0)
Platelets: 405 10*3/uL — ABNORMAL HIGH (ref 150–400)
RBC: 4.28 MIL/uL (ref 3.87–5.11)
RDW: 19.9 % — ABNORMAL HIGH (ref 11.5–15.5)
WBC: 18.2 10*3/uL — ABNORMAL HIGH (ref 4.0–10.5)
nRBC: 0.3 % — ABNORMAL HIGH (ref 0.0–0.2)

## 2022-02-04 LAB — BASIC METABOLIC PANEL
Anion gap: 13 (ref 5–15)
BUN: 23 mg/dL — ABNORMAL HIGH (ref 6–20)
CO2: 24 mmol/L (ref 22–32)
Calcium: 9.7 mg/dL (ref 8.9–10.3)
Chloride: 107 mmol/L (ref 98–111)
Creatinine, Ser: 4.92 mg/dL — ABNORMAL HIGH (ref 0.44–1.00)
GFR, Estimated: 10 mL/min — ABNORMAL LOW (ref >60)
Glucose, Bld: 134 mg/dL — ABNORMAL HIGH (ref 70–99)
Potassium: 4 mmol/L (ref 3.5–5.1)
Sodium: 144 mmol/L (ref 135–145)

## 2022-02-04 LAB — GLUCOSE, CAPILLARY
Glucose-Capillary: 151 mg/dL — ABNORMAL HIGH (ref 70–99)
Glucose-Capillary: 145 mg/dL — ABNORMAL HIGH (ref 70–99)
Glucose-Capillary: 133 mg/dL — ABNORMAL HIGH (ref 70–99)
Glucose-Capillary: 170 mg/dL — ABNORMAL HIGH (ref 70–99)

## 2022-02-04 LAB — TROPONIN I (HIGH SENSITIVITY)
Troponin I (High Sensitivity): 20 ng/L — ABNORMAL HIGH (ref <18)
Troponin I (High Sensitivity): 21 ng/L — ABNORMAL HIGH (ref <18)

## 2022-02-04 LAB — SURGICAL PCR SCREEN
MRSA, PCR: POSITIVE — AB
Staphylococcus aureus: POSITIVE — AB

## 2022-02-04 SURGERY — DEBRIDEMENT, WOUND
Anesthesia: Choice | Laterality: Right

## 2022-02-04 MED ORDER — BUPROPION HCL ER (SR) 150 MG PO TB12
150.0000 mg | ORAL_TABLET | Freq: Every day | ORAL | Status: DC
  Administered 2022-02-04 – 2022-02-15 (×12): 150 mg via ORAL
  Filled 2022-02-04 (×12): qty 1

## 2022-02-04 MED ORDER — MUPIROCIN 2 % EX OINT
1.0000 "application " | TOPICAL_OINTMENT | Freq: Two times a day (BID) | CUTANEOUS | Status: AC
  Administered 2022-02-04 – 2022-02-08 (×10): 1 via NASAL
  Filled 2022-02-04 (×2): qty 22

## 2022-02-04 MED ORDER — FUROSEMIDE 40 MG PO TABS
40.0000 mg | ORAL_TABLET | Freq: Two times a day (BID) | ORAL | Status: DC
Start: 2022-02-04 — End: 2022-02-05
  Administered 2022-02-04 (×2): 40 mg via ORAL
  Filled 2022-02-04 (×2): qty 1

## 2022-02-04 MED ORDER — METOCLOPRAMIDE HCL 5 MG PO TABS
10.0000 mg | ORAL_TABLET | Freq: Three times a day (TID) | ORAL | Status: DC
  Administered 2022-02-04 – 2022-02-19 (×38): 10 mg via ORAL
  Filled 2022-02-04 (×6): qty 2
  Filled 2022-02-04: qty 1
  Filled 2022-02-04 (×11): qty 2
  Filled 2022-02-04: qty 1
  Filled 2022-02-04 (×2): qty 2
  Filled 2022-02-04: qty 1
  Filled 2022-02-04 (×2): qty 2
  Filled 2022-02-04: qty 1
  Filled 2022-02-04 (×2): qty 2
  Filled 2022-02-04: qty 1
  Filled 2022-02-04 (×6): qty 2
  Filled 2022-02-04: qty 1
  Filled 2022-02-04 (×4): qty 2

## 2022-02-04 MED ORDER — ACETAMINOPHEN 325 MG PO TABS: 650.0000 mg | ORAL_TABLET | ORAL | Status: DC | PRN

## 2022-02-04 MED ORDER — OXYCODONE-ACETAMINOPHEN 5-325 MG PO TABS
1.0000 | ORAL_TABLET | Freq: Four times a day (QID) | ORAL | Status: DC | PRN
  Administered 2022-02-04 – 2022-02-11 (×18): 1 via ORAL
  Filled 2022-02-04 (×18): qty 1

## 2022-02-04 MED ORDER — CHLORHEXIDINE GLUCONATE CLOTH 2 % EX PADS
6.0000 | MEDICATED_PAD | Freq: Every day | CUTANEOUS | Status: DC
  Administered 2022-02-05 – 2022-02-18 (×15): 6 via TOPICAL

## 2022-02-04 MED ORDER — ATROPINE SULFATE 1 % OP SOLN
1.0000 [drp] | Freq: Two times a day (BID) | OPHTHALMIC | Status: DC
  Administered 2022-02-04 – 2022-02-19 (×29): 1 [drp] via OPHTHALMIC
  Filled 2022-02-04 (×2): qty 2

## 2022-02-04 MED ORDER — HYDROMORPHONE HCL 1 MG/ML IJ SOLN
INTRAMUSCULAR | Status: AC
  Administered 2022-02-04: 0.5 mg via INTRAVENOUS
  Filled 2022-02-04: qty 1

## 2022-02-04 MED ORDER — LABETALOL HCL 200 MG PO TABS
200.0000 mg | ORAL_TABLET | Freq: Two times a day (BID) | ORAL | Status: DC
  Administered 2022-02-04 – 2022-02-09 (×10): 200 mg via ORAL
  Filled 2022-02-04 (×10): qty 1

## 2022-02-04 MED ORDER — PREGABALIN 25 MG PO CAPS
25.0000 mg | ORAL_CAPSULE | Freq: Every day | ORAL | Status: DC
  Administered 2022-02-04 – 2022-02-14 (×11): 25 mg via ORAL
  Filled 2022-02-04 (×11): qty 1

## 2022-02-04 MED ORDER — AMLODIPINE BESYLATE 10 MG PO TABS
10.0000 mg | ORAL_TABLET | Freq: Every day | ORAL | Status: DC
  Administered 2022-02-05 – 2022-02-19 (×12): 10 mg via ORAL
  Filled 2022-02-04 (×13): qty 1

## 2022-02-04 MED ORDER — HYDRALAZINE HCL 20 MG/ML IJ SOLN
INTRAMUSCULAR | Status: AC
  Administered 2022-02-04: 10 mg via INTRAVENOUS
  Filled 2022-02-04: qty 1

## 2022-02-04 MED ORDER — LATANOPROST 0.005 % OP SOLN
1.0000 [drp] | Freq: Every day | OPHTHALMIC | Status: DC
  Administered 2022-02-04 – 2022-02-18 (×14): 1 [drp] via OPHTHALMIC
  Filled 2022-02-04 (×2): qty 2.5

## 2022-02-04 MED ORDER — CHLORHEXIDINE GLUCONATE 0.12 % MT SOLN
OROMUCOSAL | Status: AC
  Filled 2022-02-04: qty 15

## 2022-02-04 MED ORDER — CEFAZOLIN SODIUM-DEXTROSE 2-4 GM/100ML-% IV SOLN
INTRAVENOUS | Status: AC
  Filled 2022-02-04: qty 100

## 2022-02-04 MED ORDER — LABETALOL HCL 5 MG/ML IV SOLN
INTRAVENOUS | Status: AC
  Administered 2022-02-04: 5 mg via INTRAVENOUS
  Filled 2022-02-04: qty 4

## 2022-02-04 MED ORDER — SEVELAMER CARBONATE 800 MG PO TABS
2400.0000 mg | ORAL_TABLET | Freq: Three times a day (TID) | ORAL | Status: DC
  Administered 2022-02-04 – 2022-02-19 (×32): 2400 mg via ORAL
  Filled 2022-02-04 (×37): qty 3

## 2022-02-04 MED ORDER — RENA-VITE PO TABS
1.0000 | ORAL_TABLET | Freq: Every day | ORAL | Status: DC
  Administered 2022-02-04 – 2022-02-18 (×15): 1 via ORAL
  Filled 2022-02-04 (×15): qty 1

## 2022-02-04 MED ORDER — HEPARIN SODIUM (PORCINE) 5000 UNIT/ML IJ SOLN
5000.0000 [IU] | Freq: Three times a day (TID) | INTRAMUSCULAR | Status: DC
  Administered 2022-02-04 – 2022-02-11 (×21): 5000 [IU] via SUBCUTANEOUS
  Filled 2022-02-04 (×21): qty 1

## 2022-02-04 MED ORDER — CHLORHEXIDINE GLUCONATE 4 % EX LIQD: 60.0000 mL | Freq: Once | CUTANEOUS | Status: DC

## 2022-02-04 MED ORDER — ASPIRIN EC 81 MG PO TBEC
81.0000 mg | DELAYED_RELEASE_TABLET | Freq: Every day | ORAL | Status: DC
  Administered 2022-02-04 – 2022-02-19 (×16): 81 mg via ORAL
  Filled 2022-02-04 (×16): qty 1

## 2022-02-04 MED ORDER — SEVELAMER CARBONATE 800 MG PO TABS
800.0000 mg | ORAL_TABLET | ORAL | Status: DC
  Administered 2022-02-04: 800 mg via ORAL
  Filled 2022-02-04 (×2): qty 1

## 2022-02-04 MED ORDER — HYDROMORPHONE HCL 1 MG/ML IJ SOLN
0.5000 mg | INTRAMUSCULAR | Status: AC | PRN
  Administered 2022-02-04 – 2022-02-12 (×5): 0.5 mg via INTRAVENOUS
  Filled 2022-02-04 (×5): qty 0.5

## 2022-02-04 MED ORDER — SENNOSIDES-DOCUSATE SODIUM 8.6-50 MG PO TABS
1.0000 | ORAL_TABLET | Freq: Every day | ORAL | Status: DC | PRN
  Administered 2022-02-13: 1 via ORAL
  Filled 2022-02-04: qty 1

## 2022-02-04 MED ORDER — NITROGLYCERIN IN D5W 200-5 MCG/ML-% IV SOLN
0.0000 ug/min | INTRAVENOUS | Status: DC
  Administered 2022-02-04: 5 ug/min via INTRAVENOUS
  Filled 2022-02-04 (×2): qty 250

## 2022-02-04 MED ORDER — CALCIUM ACETATE (PHOS BINDER) 667 MG PO CAPS: 2001.0000 mg | ORAL_CAPSULE | Freq: Three times a day (TID) | ORAL | Status: DC

## 2022-02-04 MED ORDER — LABETALOL HCL 5 MG/ML IV SOLN
10.0000 mg | INTRAVENOUS | Status: DC | PRN
  Administered 2022-02-05 (×2): 10 mg via INTRAVENOUS
  Filled 2022-02-04 (×2): qty 4

## 2022-02-04 MED ORDER — CHLORHEXIDINE GLUCONATE CLOTH 2 % EX PADS
6.0000 | MEDICATED_PAD | Freq: Every day | CUTANEOUS | Status: DC
  Administered 2022-02-04: 6 via TOPICAL

## 2022-02-04 MED ORDER — CELECOXIB 100 MG PO CAPS
100.0000 mg | ORAL_CAPSULE | Freq: Once | ORAL | Status: DC
  Filled 2022-02-04: qty 1

## 2022-02-04 MED ORDER — HYDRALAZINE HCL 50 MG PO TABS
100.0000 mg | ORAL_TABLET | Freq: Three times a day (TID) | ORAL | Status: DC
  Administered 2022-02-04 – 2022-02-09 (×16): 100 mg via ORAL
  Filled 2022-02-04 (×16): qty 2

## 2022-02-04 MED ORDER — INSULIN ASPART 100 UNIT/ML IJ SOLN: 0.0000 [IU] | INTRAMUSCULAR | Status: DC | PRN

## 2022-02-04 MED ORDER — ACETAMINOPHEN 500 MG PO TABS
500.0000 mg | ORAL_TABLET | Freq: Once | ORAL | Status: DC
  Filled 2022-02-04: qty 1

## 2022-02-04 MED ORDER — LABETALOL HCL 5 MG/ML IV SOLN
5.0000 mg | Freq: Once | INTRAVENOUS | Status: AC
  Filled 2022-02-04: qty 4

## 2022-02-04 MED ORDER — DORZOLAMIDE HCL-TIMOLOL MAL 2-0.5 % OP SOLN
1.0000 [drp] | Freq: Two times a day (BID) | OPHTHALMIC | Status: DC
  Administered 2022-02-04 – 2022-02-19 (×29): 1 [drp] via OPHTHALMIC
  Filled 2022-02-04 (×2): qty 10

## 2022-02-04 MED ORDER — HYDRALAZINE HCL 20 MG/ML IJ SOLN
10.0000 mg | Freq: Three times a day (TID) | INTRAMUSCULAR | Status: DC | PRN
  Administered 2022-02-05 – 2022-02-19 (×2): 10 mg via INTRAVENOUS
  Filled 2022-02-04 (×2): qty 1

## 2022-02-04 MED ORDER — CEFAZOLIN SODIUM-DEXTROSE 2-4 GM/100ML-% IV SOLN: 2.0000 g | INTRAVENOUS | Status: DC

## 2022-02-04 MED ORDER — ALBUTEROL SULFATE (2.5 MG/3ML) 0.083% IN NEBU: 3.0000 mL | INHALATION_SOLUTION | RESPIRATORY_TRACT | Status: DC | PRN

## 2022-02-04 MED ORDER — ATORVASTATIN CALCIUM 80 MG PO TABS
80.0000 mg | ORAL_TABLET | Freq: Every day | ORAL | Status: DC
  Administered 2022-02-05 – 2022-02-19 (×15): 80 mg via ORAL
  Filled 2022-02-04 (×15): qty 1

## 2022-02-04 MED ORDER — SODIUM CHLORIDE 0.9 % IV SOLN
INTRAVENOUS | Status: DC
  Administered 2022-02-04: 250 mL via INTRAVENOUS

## 2022-02-04 NOTE — Progress Notes (Signed)
Patient received Labetalol 5 mg @ 10:12 o'clock per Dr. Erlinda Hong order. BP continue to be elevated 246/64. Per Dr. Erlinda Hong order, patient received Hydralazine 10 mg @ 10:46. At 10:15 patient received Dilaudid 0.5 mg for pain in left leg (10/10). ?At this time patient is waiting for a bed in [place]. Will continue to monitor.  ?

## 2022-02-04 NOTE — Progress Notes (Signed)
Called Rapid response to assist with transferring this patient out of short stay and getting a nitroglycerin drip started.  ?

## 2022-02-04 NOTE — Progress Notes (Signed)
Patient was transfer to [facility] per MD order. Report was given to bedside nurse. Patient was transported to [facility] by rapid response nurse and this Probation officer. ?

## 2022-02-04 NOTE — Consult Note (Signed)
? ?NAME:  ELLEANOR GUYETT, MRN:  [id number], DOB:  [birth year], LOS: 0 ?ADMISSION DATE:  02/04/2022, CONSULTATION DATE: 02/04/2022 ?REFERRING MD: [facility], CHIEF COMPLAINT: Hypertension ? ?History of Present Illness:  ?56 year old female with a plethora of health issues who was in short stay.  In preparation for vascular surgery 02/04/2022 when she is found to be hypertensive despite pharmaceutical interventions.  She was started on a nitroglycerin drip for initial blood pressure of 264/140 and pulmonary critical care was asked to admit to the care unit.  She has an extensive past medical history is well-documented below.  He is awake and follows commands she does not report chest pain at the time of my evaluation although she had earlier and is on nitroglycerin drip.  She is currently on room air with adequate O2 saturations.  Her blood pressure has been coming down to an in the evaluation she was 190/94 where she had been in the 220s on my arrival.  She will be transferred to intensive care unit restarted on all her home antihypertensives and once her blood pressure is controlled she is also nitroglycerin drip she will be transferred out of the intensive care unit back to [facility] hospitalist service. ? ?Pertinent  Medical History  ? ?Past Medical History:  ?Diagnosis Date  ? Anemia   ? Anxiety   ? Blind right eye   ? Chronic kidney disease, stage V ([place])   ? Dialysis M-W-F  ? Confusion   ? at times  ? Constipation, chronic   ? Dental caries   ? Depression   ? Diabetes mellitus   ? Diabetic neuropathy ([place])   ? Diabetic retinopathy   ? GERD (gastroesophageal reflux disease)   ? Glaucoma   ? H. pylori infection   ? Hepatitis C carrier ([place])   ? High risk sexual behavior   ? Hyperlipidemia   ? Hypertension   ? Hypoglycemia 07/12/2017  ? Insomnia   ? Microalbuminuria   ? Nonspecific elevation of levels of transaminase or lactic acid dehydrogenase (LDH)   ? Tobacco dependence   ? Vitamin D deficiency   ? ? ? ?Significant Hospital  Events: ?Including procedures, antibiotic start and stop dates in addition to other pertinent events   ?Admitted to the intensive care unit for persistent hypertension ? ?Interim History / Subjective:  ?Vascular surgery not able to be performed due to hypertension ? ?Objective   ?Blood pressure (!) 191/65, pulse 83, temperature 98 ?F (36.7 ?C), temperature source Oral, resp. rate 16, height 5\' 1"  (1.549 m), weight 41.7 kg, SpO2 96 %. ?   ?   ?No intake or output data in the 24 hours ending 02/04/22 1148 ?Filed Weights  ? 02/04/22 0745  ?Weight: 41.7 kg  ? ? ?Examination: ?General: 56 year old female who appears older than stated age although hypertensive is not in acute distress ?HENT: No JVD or lymphadenopathy is appreciated ?Lungs: Clear to auscultation ?Cardiovascular: Regular rate and rhythm ?Abdomen: Soft nontender ?Extremities: See pictures for right AKA and left great toe vascular insufficiency ?Neuro: Dull effect but follows commands ?GU: End-stage renal disease ? ?Resolved Hospital Problem list   ? ? ?Assessment & Plan:  ?Acute on chronic hypertension refractory to current interventions was scheduled for surgery today by vascular.  Counseled with pressures of 246/124.  Given metoprolol and Apresoline IV and started on nitroglycerin drip. ?Transferred to the intensive care unit into pulmonary critical care service while on nitroglycerin drip in the intensive care unit ?Once stabilized to be transferred  back to [facility] hospitalist ?Currently on nitroglycerin drip ?Resume home antihypertensives ? ?Severe peripheral vascular disease status post right AKA and needs surgical intervention of the left great toe. ? ? ? ?Per vascular surgery ?Continue to monitor wounds ?Currently on antimicrobial therapy ? ?End-stage renal disease AV graft left forearm dialysis Monday Wednesdays and Fridays ?She will need renal consult if she remains through Friday ? ?Insulin-dependent diabetic ?Sliding-scale insulin  protocol ? 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 ?Diet/type: Regular consistency (see orders) ?DVT prophylaxis: not indicated ?GI prophylaxis: PPI ?Lines: N/A ?Foley:  N/A ?Code Status:  full code ?Last date of multidisciplinary goals of care discussion [tbd ?Left forearm AV graft positive bruit and thrill 02/04/2022 ? ?Labs   ?CBC: ?No results for input(s): WBC, NEUTROABS, HGB, HCT, MCV, PLT in the last 168 hours. ? ?Basic Metabolic Panel: ?No results for input(s): NA, K, CL, CO2, GLUCOSE, BUN, CREATININE, CALCIUM, MG, PHOS in the last 168 hours. ?GFR: ?CrCl cannot be calculated (Patient's most recent lab result is older than the maximum 21 days allowed.). ?No results for input(s): PROCALCITON, WBC, LATICACIDVEN in the last 168 hours. ? ?Liver Function Tests: ?No results for input(s): AST, ALT, ALKPHOS, BILITOT, PROT, ALBUMIN in the last 168 hours. ?No results for input(s): LIPASE, AMYLASE in the last 168 hours. ?No results for input(s): AMMONIA in the last 168 hours. ? ?ABG ?   ?Component Value Date/Time  ? HCO3 19.0 (L) 09/30/2021 1106  ? TCO2 30 12/21/2021 0924  ? ACIDBASEDEF 5.0 (H) 09/30/2021 1106  ? O2SAT 79.0 09/30/2021 1106  ?  ? ?Coagulation Profile: ?No results for input(s): INR, PROTIME in the last 168 hours. ? ?Cardiac Enzymes: ?No results for input(s): CKTOTAL, CKMB, CKMBINDEX, TROPONINI in the last 168 hours. ? ?HbA1C: ?HbA1c, POC (prediabetic range)  ?Date/Time Value Ref Range Status  ?07/17/2019 11:35 AM 5.2 (A) 5.7 - 6.4 % Final  ? ?HbA1c, POC (controlled diabetic range)  ?Date/Time Value Ref Range Status  ?07/17/2019 11:35 AM 5.2 0.0 - 7.0 % Final  ? ?HbA1c POC (<> result, manual entry)  ?Date/Time Value Ref Range Status  ?07/17/2019 11:35 AM 5.2 4.0 - 5.6 % Final  ? ?Hgb A1c MFr Bld  ?Date/Time Value Ref Range Status  ?11/15/2021 01:08 AM 5.5 4.8 - 5.6 % Final  ?  Comment:  ?  (NOTE) ?         Prediabetes: 5.7 - 6.4 ?        Diabetes: >6.4 ?        Glycemic control for adults with diabetes: <7.0 ?  ?09/30/2021 09:18 PM 5.9 (H) 4.8 - 5.6 % Final  ?  Comment:  ?  (NOTE) ?Pre diabetes:          5.7%-6.4% ? ?Diabetes:              >6.4% ? ?Glycemic control for   <7.0% ?adults with diabetes ?  ? ? ?CBG: ?Recent Labs  ?Lab 02/04/22 ?6222 02/04/22 ?0953 02/04/22 ?1141  ?GLUCAP 151* 145* 133*  ? ? ?Review of Systems:   ?10 point review of system taken, please see HPI for positives and negatives. ?Positive for mild chest pain ?No respiratory distress ?Pain in right.  And left great toe she reports to call bell under irregular ? ? ?Past Medical History:  ?She,  has a past medical history of Anemia, Anxiety, Blind right eye, Chronic kidney disease, stage V ([place]), Confusion,  Constipation, chronic, Dental caries, Depression, Diabetes mellitus, Diabetic neuropathy ([place]), Diabetic retinopathy, GERD (gastroesophageal reflux disease), Glaucoma, H. pylori infection, Hepatitis C carrier ([place]), High risk sexual behavior, Hyperlipidemia, Hypertension, Hypoglycemia (07/12/2017), Insomnia, Microalbuminuria, Nonspecific elevation of levels of transaminase or lactic acid dehydrogenase (LDH), Tobacco dependence, and Vitamin D deficiency.  ? ?Surgical History:  ? ?Past Surgical History:  ?Procedure Laterality Date  ? ABDOMINAL AORTOGRAM W/LOWER EXTREMITY N/A 10/27/2021  ? Procedure: ABDOMINAL AORTOGRAM W/LOWER EXTREMITY;  Surgeon: Serafina Mitchell, MD;  Location: [place] [facility];  Service: Cardiovascular;  Laterality: N/A;  ? ABDOMINAL HYSTERECTOMY  04/30/2009  ? PARTIAL  ? AMPUTATION Right 10/30/2021  ? Procedure: RIGHT GREAT TOE AMPUTATION;  Surgeon: Serafina Mitchell, MD;  [facility];  Service: Vascular;  Laterality: Right;  ? AMPUTATION Right 12/21/2021  ? Procedure: RIGHT ABOVE KNEE AMPUTATION;  Surgeon: [name], MD;  Location: [place];  Service: Vascular;  Laterality: Right;  ? COLONOSCOPY    ? DIALYSIS FISTULA  CREATION Left 06/2017  ? ENDARTERECTOMY FEMORAL Right 10/30/2021  ? Procedure: RIGHT FEMORAL ENDARTERECTOMY WITH ANGIOPLASTY OF TIBIAL ARTERY;  Surgeon: Serafina Mitchell, MD;  Location: [place];  Service: Vascular;  Laterality: Right

## 2022-02-04 NOTE — Significant Event (Signed)
Rapid Response Event Note  ? ?Reason for Call : Hypertension ? ? ?Initial Focused Assessment: Pt is alert and oriented. Lungs are clear throughout. Skin is warm and dry. Patient is blind. She does not endorse any pain. She is here today for surgery of debridement of right leg stump. Pt took all home anti-hypertensive meds prior to arrival. BP elevated in short stay, 236/82. 5mg  Labatolol IV and 10mg  Hydralazine IV given by bedside RN with no improvement in BP.  ? ?VS: BP 215/62, HR 82, RR 13, O2 95% on room air ?CBG: 133 ? ? ?Interventions:  ?-Nitro gtt started per MD order ? ?Plan of Care:  ?-Admit to ICU ?-Nitro Gtt ?-Scheduled surgery is cancelled. ? ?Event Summary:  ? ?MD Notified: Dr Glenna Fellows, NP CCM ?Call Time: 1044 ?Arrival Time: 1093 ?End Time: 1208 ? ?Fulton Reek, RN ?

## 2022-02-04 NOTE — Progress Notes (Signed)
Patient called out complaining of chest pain and dizziness.  Received order to obtain stat EKG.  Dr. Lissa Hoard made aware and came to bedside. ?

## 2022-02-04 NOTE — Progress Notes (Signed)
BP 215/62 - Dr. Erlinda Hong notified. ?

## 2022-02-04 NOTE — H&P (Signed)
?History and Physical  ? ? [name] ERX:[id number] DOB: 1966/02/28 DOA: 02/04/2022 ? ?PCP: [name], FNP ?Patient coming from: home ? ?I have personally briefly reviewed patient's old medical records in [place] ? ?Chief Complaint: elevated blood pressure  ? ?HPI: Tammy Moses is a 56 y.o. female with medical history significant of HTN, IDDM2, blind right eye, ESRD on HD MWF, PAD has been struggling with rihgt foot nonhealing wound post right AKA, she got her dialysis yesterday and came to [facility] outpatient surgery for elective wound debridment and angiogram, preop she is found to have severe hypertension ? ?Family also concerned about FTT, intermittent confusion at home, per chart review, this is her 8th hospitalization since 09/2021. ? ? ? ?Review of Systems: As per HPI otherwise all other systems reviewed and are negative. ? ? ?Past Medical History:  ?Diagnosis Date  ? Anemia   ? Anxiety   ? Blind right eye   ? Chronic kidney disease, stage V ([place])   ? Dialysis M-W-F  ? Confusion   ? at times  ? Constipation, chronic   ? Dental caries   ? Depression   ? Diabetes mellitus   ? Diabetic neuropathy ([place])   ? Diabetic retinopathy   ? GERD (gastroesophageal reflux disease)   ? Glaucoma   ? H. pylori infection   ? Hepatitis C carrier ([place])   ? High risk sexual behavior   ? Hyperlipidemia   ? Hypertension   ? Hypoglycemia 07/12/2017  ? Insomnia   ? Microalbuminuria   ? Nonspecific elevation of levels of transaminase or lactic acid dehydrogenase (LDH)   ? Tobacco dependence   ? Vitamin D deficiency   ? ? ?Past Surgical History:  ?Procedure Laterality Date  ? ABDOMINAL AORTOGRAM W/LOWER EXTREMITY N/A 10/27/2021  ? Procedure: ABDOMINAL AORTOGRAM W/LOWER EXTREMITY;  Surgeon: Serafina Mitchell, MD;  Location: [place] [facility];  Service: Cardiovascular;  Laterality: N/A;  ? ABDOMINAL HYSTERECTOMY  04/30/2009  ? PARTIAL  ? AMPUTATION Right 10/30/2021  ? Procedure: RIGHT GREAT TOE AMPUTATION;  Surgeon:  Serafina Mitchell, MD;  Location: [facility] OR;  Service: Vascular;  Laterality: Right;  ? AMPUTATION Right 12/21/2021  ? Procedure: RIGHT ABOVE KNEE AMPUTATION;  Surgeon: [name], MD;  Location: [place];  Service: Vascular;  Laterality: Right;  ? COLONOSCOPY    ? DIALYSIS FISTULA CREATION Left 06/2017  ? ENDARTERECTOMY FEMORAL Right 10/30/2021  ? Procedure: RIGHT FEMORAL ENDARTERECTOMY WITH ANGIOPLASTY OF TIBIAL ARTERY;  Surgeon: Serafina Mitchell, MD;  Location: [facility] OR;  Service: Vascular;  Laterality: Right;  ? ESOPHAGOGASTRODUODENOSCOPY    ? GROIN DEBRIDEMENT Right 11/14/2021  ? Procedure: Wound exploration and closure.;  Surgeon: Angelia Mould, MD;  Location: [facility] OR;  Service: Vascular;  Laterality: Right;  ? IR AV DIALY SHUNT INTRO NEEDLE/INTRACATH INITIAL W/PTA/IMG RIGHT Right 10/07/2021  ? IR FLUORO GUIDE CV LINE RIGHT  10/06/2021  ? IR US GUIDE VASC ACCESS RIGHT  10/06/2021  ? IR US GUIDE VASC ACCESS RIGHT  10/08/2021  ? ? ?Social History ? reports that she quit smoking about 3 months ago. Her smoking use included cigarettes. She smoked an average of .25 packs per day. She has never been exposed to tobacco smoke. She has never used smokeless tobacco. She reports that she does not currently use alcohol. She reports that she does not currently use drugs after having used the following drugs: Marijuana and Cocaine. ? ?Allergies  ?Allergen Reactions  ?  Acyclovir And Related Itching  ? ? ?Family History  ?Problem Relation Age of Onset  ? High blood pressure Mother   ? Diabetes Mother   ? Thyroid disease Mother   ? Diabetes Father   ? High blood pressure Father   ? Cerebral palsy Daughter   ? Other Son   ?     still born  ? ? ?Prior to Admission medications   ?Medication Sig Start Date End Date Taking? Authorizing Provider  ?acetaminophen (TYLENOL) 325 MG tablet Take 2 tablets (650 mg total) by mouth every 4 (four) hours as needed for headache or mild pain. 11/06/21  Yes Samella Parr, NP  ?albuterol  (VENTOLIN HFA) 108 (90 Base) MCG/ACT inhaler Inhale 2 puffs into the lungs every 4 (four) hours as needed for shortness of breath. For shortness of breath ?Patient taking differently: Inhale 2 puffs into the lungs every 4 (four) hours as needed for shortness of breath or wheezing. 12/11/19  Yes [name], FNP  ?amLODipine (NORVASC) 10 MG tablet Take 1 tablet (10 mg total) by mouth daily. 11/06/21  Yes Samella Parr, NP  ?aspirin EC 81 MG EC tablet Take 1 tablet (81 mg total) by mouth daily. Swallow whole. 11/06/21  Yes Samella Parr, NP  ?atorvastatin (LIPITOR) 80 MG tablet Take 1 tablet (80 mg total) by mouth daily. 11/06/21  Yes Samella Parr, NP  ?atropine 1 % ophthalmic solution Place 1 drop into both eyes 2 (two) times daily. (0900 & 2100)   Yes [provider]  ?dorzolamide-timolol (COSOPT) 22.3-6.8 MG/ML ophthalmic solution Place 1 drop into both eyes 2 (two) times daily. 12/08/20  Yes [provider]  ?furosemide (LASIX) 40 MG tablet Take 40 mg by mouth 2 (two) times daily. (0800 & 2000) 11/10/21  Yes [provider]  ?hydrALAZINE (APRESOLINE) 100 MG tablet Take 1 tablet (100 mg total) by mouth every 8 (eight) hours. ?Patient taking differently: Take 100 mg by mouth every 8 (eight) hours. (0800, 1400 & 2200) 10/09/21  Yes Adhikari, Tamsen Meek, MD  ?insulin lispro (HUMALOG) 100 UNIT/ML injection Inject 2-10 Units into the skin 3 (three) times daily before meals. Inject as per sliding scale ?150-200=2 units ?201-250=4 units ?251-300=6 units ?301-350=8 units ?351-400=10 units   Yes [provider]  ?labetalol (NORMODYNE) 200 MG tablet Take 1 tablet (200 mg total) by mouth 2 (two) times daily. 11/06/21  Yes Samella Parr, NP  ?latanoprost (XALATAN) 0.005 % ophthalmic solution Place 1 drop into both eyes at bedtime. (2100) 12/16/20  Yes [provider]  ?losartan (COZAAR) 100 MG tablet Take 1 tablet (100 mg total) by mouth daily. 11/06/21  Yes Samella Parr,  NP  ?metoCLOPramide (REGLAN) 10 MG tablet Take 10 mg by mouth in the morning and at bedtime. (0800 & 2000) 09/22/21  Yes [provider]  ?senna-docusate (SENOKOT-S) 8.6-50 MG tablet Take 1 tablet by mouth at bedtime as needed for mild constipation. ?Patient taking differently: Take 1 tablet by mouth daily as needed for mild constipation. 11/06/21  Yes Samella Parr, NP  ?sevelamer carbonate (RENVELA) 800 MG tablet Take 800-2,400 mg by mouth See admin instructions. Take 3 tablets (2400 mg) by mouth 3 time daily with meals (0800, 1300 & 1700) + Take 1 tablet (800 mg) by mouth as needed for ESRD with snacks.   Yes [provider]  ?b complex-vitamin c-folic acid (NEPHRO-VITE) 0.8 MG TABS tablet Take 1 tablet by mouth at bedtime.    [provider]  ?buPROPion (WELLBUTRIN SR) 150 MG 12 hr tablet Take 1 tablet (150 mg total) by mouth daily. ?Patient not taking: Reported on 02/02/2022 11/06/21   Samella Parr, NP  ?calcium acetate (PHOSLO) 667 MG capsule Take 3 capsules (2,001 mg total) by mouth 3 (three) times daily with meals. ?Patient not taking: Reported on 02/02/2022 11/06/21   Samella Parr, NP  ?multivitamin (RENA-VIT) TABS tablet Take 1 tablet by mouth at bedtime. ?Patient not taking: Reported on 02/02/2022 11/06/21   Samella Parr, NP  ?oxyCODONE-acetaminophen (PERCOCET/ROXICET) 5-325 MG tablet Take 1 tablet by mouth every 6 (six) hours as needed for severe pain. 02/02/22   Baglia, Corrina, PA-C  ?pregabalin (LYRICA) 25 MG capsule Take 1 capsule (25 mg total) by mouth at bedtime. ?Patient not taking: Reported on 02/02/2022 11/06/21   Geradine Girt, DO  ? ? ?Physical Exam: ?Vitals:  ? 02/04/22 0745 02/04/22 0815 02/04/22 0820 02/04/22 0915  ?BP:  (!) 253/114 (!) 254/97 (!) 254/99  ?Pulse: 95   94  ?Resp: 18   17  ?Temp: 98 ?F (36.7 ?C)     ?TempSrc: Oral     ?SpO2: 97%   95%  ?Weight: 41.7 kg     ?Height: 5\' 1"  (1.549 m)     ? ? ?Constitutional: Chronically ill-appearing, appear  weak, impaired vision ?ENMT: Mucous membranes are moist.  ?Respiratory: clear to auscultation bilaterally, no wheezing, no crackles. Normal respiratory effort. No accessory muscle use.  ?Cardiovascular: Regular

## 2022-02-04 NOTE — Consult Note (Signed)
ESRD Consult Note ? ?Assessment/Recommendations: KYE SILVERSTEIN is a/an 56 y.o. female with a past medical history notable for ESRD on HD, PAD, DM2, HTN initially presented for outpatient elective wound debridement and angio however was found to have severe uncontrolled HTN.  ? ?# ESRD:  ?-outpatient orders: [facility].  MWF.  [id number].  3.5 hours.  350/600.  2K, 2.5 Cal.  LUE AVF.  Heparin 1500 units bolus, 500 units maintenance.  Venofer 50 mg q. treatment, EPO 11,800 units, Sensipar 30 q. Mondays ?-Next HD tomorrow ? ?# Volume/ severe hypertension: EDW 43.5kg. Attempt to achieve EDW as tolerated ?-titration of nitro gtt per CCM, will arrange for HD in AM--will UF as tolerated on HD ? ?# PAD s/p right AKA w/ nonhealing wound/gangrene, leukocytosis ?-per VVS, needs eventual debridement/angio once more stabilized from a BP standpoint ?-abx per primary service if indicated ? ?# Anemia of Chronic Kidney Disease: Hemoglobin 11. Currently receives venofer and epo as above. Can resume meds if hgb <10 ? ?# Secondary Hyperparathyroidism/Hyperphosphatemia: resume home binders and sensipar  ? ?# Vascular access: LUE AVF ? ?# Additional recommendations: ?- Dose all meds for creatinine clearance < 10 ml/min  ?- Unless absolutely necessary, no MRIs with gadolinium.  ?- Implement save arm precautions.  Prefer needle sticks in the dorsum of the hands or wrists.  No blood pressure measurements in arm. ?- If blood transfusion is requested during hemodialysis sessions, please alert [place] prior to the session.  ?- If a hemodialysis catheter line culture is requested, please alert [place] as only hemodialysis nurses are able to collect those specimens.  ? ?Recommendations were discussed with the primary team. ? ?Gean Quint, MD ?[facility] ? ? ?History of Present Illness: RAGINA FENTER is a/an 56 y.o. female with a past medical history of ESRD, PAD, DM, hypertension initially presented for outpatient elective wound  debridement and angio however was found to have severe uncontrolled HTN.  She was found to be severely hypertensive preop should not improve despite taking home medications, IV labetalol, and IV hydralazine.  Therefore, patient was transferred to ICU and started on nitroglycerin drip and her BP has slightly improved.  Last dialysis was yesterday on 4/12. She currently reports feeling drowsy but otherwise denies any fevers, chills, chest pain, SOB, swelling, issues with AVF. She reports that she does not feel like she has much fluid on board and she also reports that dialysis has been overall going well for her as an outpatient. ? ? ?Medications:  ?Current Facility-Administered Medications  ?Medication Dose Route Frequency Provider Last Rate Last Admin  ? acetaminophen (TYLENOL) tablet 650 mg  650 mg Oral Q4H PRN Florencia Reasons, MD      ? albuterol (PROVENTIL) (2.5 MG/3ML) 0.083% nebulizer solution 3 mL  3 mL Inhalation Q4H PRN Florencia Reasons, MD      ? Derrill Memo ON 02/05/2022] amLODipine (NORVASC) tablet 10 mg  10 mg Oral Daily Florencia Reasons, MD      ? aspirin EC tablet 81 mg  81 mg Oral Daily Florencia Reasons, MD      ? Derrill Memo ON 02/05/2022] atorvastatin (LIPITOR) tablet 80 mg  80 mg Oral Daily Florencia Reasons, MD      ? atropine 1 % ophthalmic solution 1 drop  1 drop Both Eyes BID Florencia Reasons, MD      ? buPROPion [facility] SR) 12 hr tablet 150 mg  150 mg Oral Daily Florencia Reasons, MD      ? ceFAZolin (ANCEF) 2-4  GM/100ML-% IVPB           ? Chlorhexidine Gluconate Cloth 2 % PADS 6 each  6 each Topical Q0600 Minor, Grace Bushy, NP   6 each at 02/04/22 1224  ? dorzolamide-timolol (COSOPT) 22.3-6.8 MG/ML ophthalmic solution 1 drop  1 drop Both Eyes BID Florencia Reasons, MD      ? furosemide (LASIX) tablet 40 mg  40 mg Oral BID Florencia Reasons, MD      ? heparin injection 5,000 Units  5,000 Units Subcutaneous Q8H Florencia Reasons, MD      ? hydrALAZINE (APRESOLINE) injection 10 mg  10 mg Intravenous Q8H PRN Florencia Reasons, MD   10 mg at 02/04/22 1046  ? hydrALAZINE (APRESOLINE) tablet 100 mg   100 mg Oral Q8H Florencia Reasons, MD      ? HYDROmorphone (DILAUDID) injection 0.5 mg  0.5 mg Intravenous Q4H PRN Florencia Reasons, MD   0.5 mg at 02/04/22 1015  ? labetalol (NORMODYNE) injection 10 mg  10 mg Intravenous Q2H PRN Florencia Reasons, MD      ? labetalol (NORMODYNE) tablet 200 mg  200 mg Oral BID Florencia Reasons, MD      ? latanoprost (XALATAN) 0.005 % ophthalmic solution 1 drop  1 drop Both Eyes QHS Florencia Reasons, MD      ? metoCLOPramide (REGLAN) tablet 10 mg  10 mg Oral TID Marcelle Overlie, MD      ? multivitamin (RENA-VIT) tablet 1 tablet  1 tablet Oral QHS Florencia Reasons, MD      ? mupirocin ointment (BACTROBAN) 2 % 1 application.  1 application. Nasal BID Minor, Grace Bushy, NP      ? nitroGLYCERIN 50 mg in dextrose 5 % 250 mL (0.2 mg/mL) infusion  0-200 mcg/min Intravenous Titrated Minor, Grace Bushy, NP 1.5 mL/hr at 02/04/22 1105 5 mcg/min at 02/04/22 1105  ? oxyCODONE-acetaminophen (PERCOCET/ROXICET) 5-325 MG per tablet 1 tablet  1 tablet Oral Q6H PRN Florencia Reasons, MD      ? pregabalin (LYRICA) capsule 25 mg  25 mg Oral QHS Florencia Reasons, MD      ? senna-docusate (Senokot-S) tablet 1 tablet  1 tablet Oral Daily PRN Florencia Reasons, MD      ? sevelamer carbonate (RENVELA) tablet 2,400 mg  2,400 mg Oral TID WC Florencia Reasons, MD      ? sevelamer carbonate (RENVELA) tablet 800 mg  800 mg Oral With snacks Levada Dy, Dwayne A, RPH      ?  ? ?ALLERGIES ?Acyclovir and related ? ?MEDICAL HISTORY ?Past Medical History:  ?Diagnosis Date  ? Anemia   ? Anxiety   ? Blind right eye   ? Chronic kidney disease, stage V ([place])   ? Dialysis M-W-F  ? Confusion   ? at times  ? Constipation, chronic   ? Dental caries   ? Depression   ? Diabetes mellitus   ? Diabetic neuropathy ([place])   ? Diabetic retinopathy   ? GERD (gastroesophageal reflux disease)   ? Glaucoma   ? H. pylori infection   ? Hepatitis C carrier ([place])   ? High risk sexual behavior   ? Hyperlipidemia   ? Hypertension   ? Hypoglycemia 07/12/2017  ? Insomnia   ? Microalbuminuria   ? Nonspecific elevation of levels of transaminase  or lactic acid dehydrogenase (LDH)   ? Tobacco dependence   ? Vitamin D deficiency   ?  ? ?SOCIAL HISTORY ?Social History  ? ?Socioeconomic History  ? Marital status: Divorced  ?  Spouse name: Not on file  ? Number of children: 3  ? Years of education: 56  ? Highest education level: Not on file  ?Occupational History  ?  Employer: DISABLED  ?  Comment: Disabled  ?Tobacco Use  ? Smoking status: Former  ?  Packs/day: 0.25  ?  Types: Cigarettes  ?  Quit date: 10/2021  ?  Years since quitting: 0.2  ?  Passive exposure: Never  ? Smokeless tobacco: Never  ? Tobacco comments:  ?  trying to quit  ?Vaping Use  ? Vaping Use: Never used  ?Substance and Sexual Activity  ? Alcohol use: Not Currently  ?  Comment: 2-3 beers weekly  ? Drug use: Not Currently  ?  Types: Marijuana, Cocaine  ?  Comment: quit in 2018  ? Sexual activity: Yes  ?  Birth control/protection: Surgical  ?  Comment: Hysterectomy  ?Other Topics Concern  ? Not on file  ?Social History Narrative  ? Patient lives at home alone.  ? Disabled.  ? Right handed.  ? Education 11 th grade.  ? Caffeine three cups of coffee daily.  ? No psychiatrist  ? ?Social Determinants of Health  ? ?Financial Resource Strain: Not on file  ?Food Insecurity: Not on file  ?Transportation Needs: Not on file  ?Physical Activity: Not on file  ?Stress: Not on file  ?Social Connections: Not on file  ?Intimate Partner Violence: Not on file  ?  ? ?FAMILY HISTORY ?Family History  ?Problem Relation Age of Onset  ? High blood pressure Mother   ? Diabetes Mother   ? Thyroid disease Mother   ? Diabetes Father   ? High blood pressure Father   ? Cerebral palsy Daughter   ? Other Son   ?     still born  ?  ? ?Review of Systems: ?12 systems were reviewed and negative except per HPI ? ?Physical Exam: ?Vitals:  ? 02/04/22 1145 02/04/22 1215  ?BP: (!) 191/65 (!) 186/67  ?Pulse: 83 87  ?Resp: 16 10  ?Temp:    ?SpO2: 96% 95%  ? ?No intake/output data recorded. ?No intake or output data in the 24 hours ending  02/04/22 1257 ?General: no acute distress ?HEENT: anicteric sclera, MMM ?CV: normal rate, no murmurs ?Lungs: bilateral chest rise, normal wob, cta bl ?Abd: soft, non-tender, non-distended ?Ext: rt AKA, left

## 2022-02-04 NOTE — H&P (Signed)
See consult note from same day for H&P ?

## 2022-02-04 NOTE — Progress Notes (Signed)
BP elevated in short stay 253/114 and 254/97. Patient verbalized that she took this morning Norvasc 10 mg, Hydralazine 100 mg and Labetalol 200 mg. Dr. Lissa Hoard was notified. Per MD Celebrex was D/C.  ?

## 2022-02-05 ENCOUNTER — Ambulatory Visit: Payer: Self-pay | Admitting: *Deleted

## 2022-02-05 ENCOUNTER — Inpatient Hospital Stay (HOSPITAL_COMMUNITY): Payer: Medicare Other

## 2022-02-05 DIAGNOSIS — I10 Essential (primary) hypertension: Secondary | ICD-10-CM | POA: Diagnosis not present

## 2022-02-05 DIAGNOSIS — I161 Hypertensive emergency: Principal | ICD-10-CM

## 2022-02-05 LAB — COMPREHENSIVE METABOLIC PANEL
ALT: 16 U/L (ref 0–44)
AST: 17 U/L (ref 15–41)
Albumin: 2.6 g/dL — ABNORMAL LOW (ref 3.5–5.0)
Alkaline Phosphatase: 93 U/L (ref 38–126)
Anion gap: 10 (ref 5–15)
BUN: 33 mg/dL — ABNORMAL HIGH (ref 6–20)
CO2: 27 mmol/L (ref 22–32)
Calcium: 9.4 mg/dL (ref 8.9–10.3)
Chloride: 99 mmol/L (ref 98–111)
Creatinine, Ser: 6.15 mg/dL — ABNORMAL HIGH (ref 0.44–1.00)
GFR, Estimated: 7 mL/min — ABNORMAL LOW (ref >60)
Glucose, Bld: 100 mg/dL — ABNORMAL HIGH (ref 70–99)
Potassium: 3.9 mmol/L (ref 3.5–5.1)
Sodium: 136 mmol/L (ref 135–145)
Total Bilirubin: 0.5 mg/dL (ref 0.3–1.2)
Total Protein: 6.7 g/dL (ref 6.5–8.1)

## 2022-02-05 LAB — HEPATITIS B SURFACE ANTIGEN: Hepatitis B Surface Ag: NONREACTIVE

## 2022-02-05 LAB — CBC WITH DIFFERENTIAL/PLATELET
Abs Immature Granulocytes: 0.03 10*3/uL (ref 0.00–0.07)
Basophils Absolute: 0.1 10*3/uL (ref 0.0–0.1)
Basophils Relative: 1 %
Eosinophils Absolute: 0.3 10*3/uL (ref 0.0–0.5)
Eosinophils Relative: 2 %
HCT: 32.9 % — ABNORMAL LOW (ref 36.0–46.0)
Hemoglobin: 9.6 g/dL — ABNORMAL LOW (ref 12.0–15.0)
Immature Granulocytes: 0 %
Lymphocytes Relative: 19 %
Lymphs Abs: 2.8 10*3/uL (ref 0.7–4.0)
MCH: 25.3 pg — ABNORMAL LOW (ref 26.0–34.0)
MCHC: 29.2 g/dL — ABNORMAL LOW (ref 30.0–36.0)
MCV: 86.8 fL (ref 80.0–100.0)
Monocytes Absolute: 1.6 10*3/uL — ABNORMAL HIGH (ref 0.1–1.0)
Monocytes Relative: 11 %
Neutro Abs: 9.8 10*3/uL — ABNORMAL HIGH (ref 1.7–7.7)
Neutrophils Relative %: 67 %
Platelets: 457 10*3/uL — ABNORMAL HIGH (ref 150–400)
RBC: 3.79 MIL/uL — ABNORMAL LOW (ref 3.87–5.11)
RDW: 19.9 % — ABNORMAL HIGH (ref 11.5–15.5)
WBC: 14.6 10*3/uL — ABNORMAL HIGH (ref 4.0–10.5)
nRBC: 0 % (ref 0.0–0.2)

## 2022-02-05 LAB — PHOSPHORUS: Phosphorus: 4.7 mg/dL — ABNORMAL HIGH (ref 2.5–4.6)

## 2022-02-05 LAB — CBC
HCT: 32.8 % — ABNORMAL LOW (ref 36.0–46.0)
Hemoglobin: 9.9 g/dL — ABNORMAL LOW (ref 12.0–15.0)
MCH: 26.1 pg (ref 26.0–34.0)
MCHC: 30.2 g/dL (ref 30.0–36.0)
MCV: 86.5 fL (ref 80.0–100.0)
Platelets: 454 10*3/uL — ABNORMAL HIGH (ref 150–400)
RBC: 3.79 MIL/uL — ABNORMAL LOW (ref 3.87–5.11)
RDW: 20.1 % — ABNORMAL HIGH (ref 11.5–15.5)
WBC: 14.1 10*3/uL — ABNORMAL HIGH (ref 4.0–10.5)
nRBC: 0 % (ref 0.0–0.2)

## 2022-02-05 LAB — RENAL FUNCTION PANEL
Albumin: 2.7 g/dL — ABNORMAL LOW (ref 3.5–5.0)
Anion gap: 9 (ref 5–15)
BUN: 35 mg/dL — ABNORMAL HIGH (ref 6–20)
CO2: 25 mmol/L (ref 22–32)
Calcium: 9.4 mg/dL (ref 8.9–10.3)
Chloride: 102 mmol/L (ref 98–111)
Creatinine, Ser: 6.48 mg/dL — ABNORMAL HIGH (ref 0.44–1.00)
GFR, Estimated: 7 mL/min — ABNORMAL LOW (ref >60)
Glucose, Bld: 166 mg/dL — ABNORMAL HIGH (ref 70–99)
Phosphorus: 4.6 mg/dL (ref 2.5–4.6)
Potassium: 4.1 mmol/L (ref 3.5–5.1)
Sodium: 136 mmol/L (ref 135–145)

## 2022-02-05 LAB — HEPATITIS B SURFACE ANTIBODY,QUALITATIVE: Hep B S Ab: REACTIVE — AB

## 2022-02-05 LAB — GLUCOSE, CAPILLARY
Glucose-Capillary: 175 mg/dL — ABNORMAL HIGH (ref 70–99)
Glucose-Capillary: 209 mg/dL — ABNORMAL HIGH (ref 70–99)

## 2022-02-05 MED ORDER — CLEVIDIPINE BUTYRATE 0.5 MG/ML IV EMUL
0.0000 mg/h | INTRAVENOUS | Status: DC
  Filled 2022-02-05: qty 50

## 2022-02-05 MED ORDER — LIDOCAINE-PRILOCAINE 2.5-2.5 % EX CREA: 1.0000 "application " | TOPICAL_CREAM | CUTANEOUS | Status: DC | PRN

## 2022-02-05 MED ORDER — LIDOCAINE HCL (PF) 1 % IJ SOLN: 5.0000 mL | INTRAMUSCULAR | Status: DC | PRN

## 2022-02-05 MED ORDER — SODIUM CHLORIDE 0.9 % IV SOLN: 100.0000 mL | INTRAVENOUS | Status: DC | PRN

## 2022-02-05 MED ORDER — INSULIN ASPART 100 UNIT/ML IJ SOLN
0.0000 [IU] | Freq: Three times a day (TID) | INTRAMUSCULAR | Status: DC
  Administered 2022-02-05 – 2022-02-07 (×4): 1 [IU] via SUBCUTANEOUS
  Administered 2022-02-07: 2 [IU] via SUBCUTANEOUS
  Administered 2022-02-08: 1 [IU] via SUBCUTANEOUS
  Administered 2022-02-08: 2 [IU] via SUBCUTANEOUS
  Administered 2022-02-09: 1 [IU] via SUBCUTANEOUS
  Administered 2022-02-10: 2 [IU] via SUBCUTANEOUS
  Administered 2022-02-12 – 2022-02-13 (×3): 1 [IU] via SUBCUTANEOUS
  Administered 2022-02-14: 2 [IU] via SUBCUTANEOUS
  Administered 2022-02-15 – 2022-02-17 (×4): 1 [IU] via SUBCUTANEOUS
  Administered 2022-02-18: 2 [IU] via SUBCUTANEOUS
  Administered 2022-02-19: 1 [IU] via SUBCUTANEOUS

## 2022-02-05 MED ORDER — HEPARIN SODIUM (PORCINE) 1000 UNIT/ML DIALYSIS
1000.0000 [IU] | INTRAMUSCULAR | Status: DC | PRN
  Filled 2022-02-05: qty 1

## 2022-02-05 MED ORDER — LOSARTAN POTASSIUM 50 MG PO TABS
50.0000 mg | ORAL_TABLET | Freq: Every day | ORAL | Status: DC
  Administered 2022-02-05 – 2022-02-06 (×2): 50 mg via ORAL
  Filled 2022-02-05 (×2): qty 1

## 2022-02-05 MED ORDER — SEVELAMER CARBONATE 800 MG PO TABS
800.0000 mg | ORAL_TABLET | ORAL | Status: DC | PRN
  Administered 2022-02-05: 800 mg via ORAL
  Filled 2022-02-05 (×2): qty 1

## 2022-02-05 MED ORDER — PENTAFLUOROPROP-TETRAFLUOROETH EX AERO: 1.0000 "application " | INHALATION_SPRAY | CUTANEOUS | Status: DC | PRN

## 2022-02-05 MED ORDER — ALTEPLASE 2 MG IJ SOLR
2.0000 mg | Freq: Once | INTRAMUSCULAR | Status: DC | PRN
Start: 2022-02-05 — End: 2022-02-08
  Filled 2022-02-05: qty 2

## 2022-02-05 MED ORDER — HEPARIN SODIUM (PORCINE) 1000 UNIT/ML DIALYSIS
1500.0000 [IU] | INTRAMUSCULAR | Status: DC | PRN
  Filled 2022-02-05: qty 2

## 2022-02-05 MED ORDER — FUROSEMIDE 40 MG PO TABS
80.0000 mg | ORAL_TABLET | Freq: Two times a day (BID) | ORAL | Status: DC
Start: 2022-02-05 — End: 2022-02-11
  Administered 2022-02-05 – 2022-02-10 (×10): 80 mg via ORAL
  Filled 2022-02-05 (×11): qty 2

## 2022-02-05 NOTE — TOC Progression Note (Signed)
Transition of Care (TOC) - Progression Note  ? ? ?Patient Details  ?Name: Tammy Moses ?MRN: [id number] ?Date of Birth: [birth year] ? ?Transition of Care (TOC) CM/SW Contact  ?Angelita Ingles, RN ?Phone Number:[phone] ? ?02/05/2022, 3:47 PM ? ?Clinical Narrative:    ? ?Transition of Care (TOC) Screening Note ? ? ?Patient Details  ?Name: Tammy Moses ?Date of Birth: Jun 02, 1966 ? ? ?Transition of Care (TOC) CM/SW Contact:    ?Angelita Ingles, RN ?Phone Number: ?02/05/2022, 3:47 PM ? ? ? ?Transition of Care Department [facility] has reviewed patient and no TOC needs have been identified at this time. We will continue to monitor patient advancement through interdisciplinary progression rounds. ? ? ? ? ?  ?  ? ?Expected Discharge Plan and Services ?  ?  ?  ?  ?  ?                ?  ?  ?  ?  ?  ?  ?  ?  ?  ?  ? ? ?Social Determinants of Health (SDOH) Interventions ?  ? ?Readmission Risk Interventions ? ?  10/09/2021  ?  4:18 PM  ?Readmission Risk Prevention Plan  ?Transportation Screening Complete  ?[place] or Home Care Consult Complete  ?SW Recovery Care/Counseling Consult Complete  ?Palliative Care Screening Not Applicable  ?Skilled Nursing Facility Complete  ? ? ?

## 2022-02-05 NOTE — Progress Notes (Signed)
?  Daily Progress Note ? ?Patient is a 56 year old female with recent right lower extremity revascularization, unsuccessful, resulting in above-knee amputation with poor healing. ? ?She presented yesterday for left-sided AKA debridement, right-sided angiography for rest pain.  In preoperative holding, she was noted to have a systolic blood pressure greater than 230 mmHg with nausea and vomiting. ? ?She was admitted and placed on a nitro drip under the care of hospital medicine/critical care. ? ?A long conversation with Kanitra's niece regarding her care.  She was recently discharged from a SNF, and has been living with her niece.  Her niece said she has had multiple episodes of confusion, with delusions she has found her crawling multiple times on the ground in the army crawl position, lacing weight on her stump incision.  Being that Iasha is blind, her niece is very concerned as it is difficult to take care of her. ? ?Treatment of her AKA wound is nonurgent as it is the left lower extremity angiogram.  These can be completed next week once the patient is optimized. ? ?[facility] critical care is involved and will continue to follow. ?With Norita's comorbidities, she is at high risk of left lower extremity limb loss as well.  During this hospitalization, I recommend goals of care discussions, specifically DNR/DNI as her health continues to slowly deteriorate. ? ? ? ? ?[name] MD MS ?[facility] ?[phone] ?02/05/2022  ?8:22 AM ? ?

## 2022-02-05 NOTE — Procedures (Signed)
I was present at this dialysis session. I have reviewed the session itself and made appropriate changes.  ? ?BP dropped fairly quickly while on HD. Nitro turned off. Try to maintain goal of 3-4L as tolerated ? ?Filed Weights  ? 02/04/22 0745 02/05/22 0655  ?Weight: 41.7 kg 46.6 kg  ? ? ?Recent Labs  ?Lab 02/05/22 ?0703  ?NA 136  ?K 4.1  ?CL 102  ?CO2 25  ?GLUCOSE 166*  ?BUN 35*  ?CREATININE 6.48*  ?CALCIUM 9.4  ?PHOS 4.6  ? ? ?Recent Labs  ?Lab 02/04/22 ?1328 02/05/22 ?0147 02/05/22 ?0703  ?WBC 18.2* 14.6* 14.1*  ?NEUTROABS  --  9.8*  --   ?HGB 11.0* 9.6* 9.9*  ?HCT 36.6 32.9* 32.8*  ?MCV 85.5 86.8 86.5  ?PLT 405* 457* 454*  ? ? ?Scheduled Meds: ? amLODipine  10 mg Oral Daily  ? aspirin EC  81 mg Oral Daily  ? atorvastatin  80 mg Oral Daily  ? atropine  1 drop Both Eyes BID  ? buPROPion  150 mg Oral Daily  ? Chlorhexidine Gluconate Cloth  6 each Topical Q0600  ? dorzolamide-timolol  1 drop Both Eyes BID  ? furosemide  80 mg Oral BID  ? heparin  5,000 Units Subcutaneous Q8H  ? hydrALAZINE  100 mg Oral Q8H  ? labetalol  200 mg Oral BID  ? latanoprost  1 drop Both Eyes QHS  ? losartan  50 mg Oral Daily  ? metoCLOPramide  10 mg Oral TID AC  ? multivitamin  1 tablet Oral QHS  ? mupirocin ointment  1 application. Nasal BID  ? pregabalin  25 mg Oral QHS  ? sevelamer carbonate  2,400 mg Oral TID WC  ? sevelamer carbonate  800 mg Oral With snacks  ? ?Continuous Infusions: ? sodium chloride    ? sodium chloride    ? nitroGLYCERIN 65 mcg/min (02/05/22 0800)  ? ?PRN Meds:.sodium chloride, sodium chloride, acetaminophen, albuterol, alteplase, heparin, heparin, hydrALAZINE, HYDROmorphone (DILAUDID) injection, labetalol, lidocaine (PF), lidocaine-prilocaine, oxyCODONE-acetaminophen, pentafluoroprop-tetrafluoroeth, senna-docusate   ?Tammy Bumpers,  MD ?02/05/2022, 9:12 AM ? ? ?

## 2022-02-05 NOTE — Progress Notes (Signed)
Nephrology Follow-Up Consult note ? ? ?Assessment/Recommendations: Tammy Moses is a/an 56 y.o. female with a past medical history significant for ESRD on HD, PAD, DM2, HTN initially presented for outpatient elective wound debridement and angio however was found to have severe uncontrolled HTN.  ?  ?# ESRD:  ?-outpatient orders: [facility].  MWF.  O160VP.  3.5 hours.  350/600.  2K, 2.5 Cal.  LUE AVF.  Heparin 1500 units bolus, 500 units maintenance.  Venofer 50 mg q. treatment, EPO 11,800 units, Sensipar 30 q. Mondays ?-HD today, ideally maintain MWF schedule, maybe extra session tomorrow if BP remains high ?  ?# Volume/ severe hypertension: EDW 43.5kg. Attempt to achieve EDW as tolerated ?-Continue labetalol and hydral.  Started losartan 50mg  and increase lasix to 80mg  BID. Now off nitro ?  ?# PAD s/p right AKA w/ nonhealing wound/gangrene, leukocytosis ?-per VVS, needs eventual debridement/angio once more stabilized from a BP standpoint. Probably next week ?-abx per primary service if indicated ?  ?# Anemia of Chronic Kidney Disease: Hemoglobin 9.9. Currently receives venofer and epo as above. Can resume meds if hgb <10 tomorrow ?  ?# Secondary Hyperparathyroidism/Hyperphosphatemia: resume home binders and sensipar  ?  ?# Vascular access: LUE AVF ?  ?# Additional recommendations: ?- Dose all meds for creatinine clearance < 10 ml/min  ?- Unless absolutely necessary, no MRIs with gadolinium.  ?- Implement save arm precautions.  Prefer needle sticks in the dorsum of the hands or wrists.  No blood pressure measurements in arm. ?- If blood transfusion is requested during hemodialysis sessions, please alert [place] prior to the session.  ?- If a hemodialysis catheter line culture is requested, please alert [place] as only hemodialysis nurses are able to collect those specimens.  ? ? ?Recommendations conveyed to primary service.  ? ? ?Reesa Chew ?[facility] ?02/05/2022 ?9:12  AM ? ?___________________________________________________________ ? ?CC: ESRD ? ?Interval History/Subjective: BP improved on nitro now off this AM on HD. Patient has no complaints. ? ? ?Medications:  ?Current Facility-Administered Medications  ?Medication Dose Route Frequency Provider Last Rate Last Admin  ? 0.9 %  sodium chloride infusion  100 mL Intravenous PRN Gean Quint, MD      ? 0.9 %  sodium chloride infusion  100 mL Intravenous PRN Gean Quint, MD      ? acetaminophen (TYLENOL) tablet 650 mg  650 mg Oral Q4H PRN Florencia Reasons, MD      ? albuterol (PROVENTIL) (2.5 MG/3ML) 0.083% nebulizer solution 3 mL  3 mL Inhalation Q4H PRN Florencia Reasons, MD      ? alteplase (CATHFLO ACTIVASE) injection 2 mg  2 mg Intracatheter Once PRN Gean Quint, MD      ? amLODipine (NORVASC) tablet 10 mg  10 mg Oral Daily Florencia Reasons, MD      ? aspirin EC tablet 81 mg  81 mg Oral Daily Florencia Reasons, MD   81 mg at 02/04/22 1343  ? atorvastatin (LIPITOR) tablet 80 mg  80 mg Oral Daily Florencia Reasons, MD      ? atropine 1 % ophthalmic solution 1 drop  1 drop Both Eyes BID Florencia Reasons, MD   1 drop at 02/04/22 2137  ? buPROPion (WELLBUTRIN SR) 12 hr tablet 150 mg  150 mg Oral Daily Florencia Reasons, MD   150 mg at 02/04/22 1346  ? Chlorhexidine Gluconate Cloth 2 % PADS 6 each  6 each Topical Q0600 Gean Quint, MD      ? dorzolamide-timolol (COSOPT)  22.3-6.8 MG/ML ophthalmic solution 1 drop  1 drop Both Eyes BID Florencia Reasons, MD   1 drop at 02/04/22 2138  ? furosemide (LASIX) tablet 80 mg  80 mg Oral BID Reesa Chew, MD      ? heparin injection 1,000 Units  1,000 Units Dialysis PRN Gean Quint, MD      ? heparin injection 1,500 Units  1,500 Units Dialysis PRN Gean Quint, MD      ? heparin injection 5,000 Units  5,000 Units Subcutaneous Q8H Florencia Reasons, MD   5,000 Units at 02/05/22 0086  ? hydrALAZINE (APRESOLINE) injection 10 mg  10 mg Intravenous Q8H PRN Florencia Reasons, MD   10 mg at 02/04/22 1046  ? hydrALAZINE (APRESOLINE) tablet 100 mg  100 mg Oral Q8H Florencia Reasons, MD    100 mg at 02/05/22 0202  ? HYDROmorphone (DILAUDID) injection 0.5 mg  0.5 mg Intravenous Q4H PRN Florencia Reasons, MD   0.5 mg at 02/05/22 0423  ? labetalol (NORMODYNE) injection 10 mg  10 mg Intravenous Q2H PRN Florencia Reasons, MD   10 mg at 02/05/22 0405  ? labetalol (NORMODYNE) tablet 200 mg  200 mg Oral BID Florencia Reasons, MD   200 mg at 02/04/22 2138  ? latanoprost (XALATAN) 0.005 % ophthalmic solution 1 drop  1 drop Both Eyes QHS Florencia Reasons, MD   1 drop at 02/04/22 2138  ? lidocaine (PF) (XYLOCAINE) 1 % injection 5 mL  5 mL Intradermal PRN Gean Quint, MD      ? lidocaine-prilocaine (EMLA) cream 1 application.  1 application. Topical PRN Gean Quint, MD      ? losartan (COZAAR) tablet 50 mg  50 mg Oral Daily Reesa Chew, MD      ? metoCLOPramide (REGLAN) tablet 10 mg  10 mg Oral TID Marcelle Overlie, MD   10 mg at 02/05/22 0801  ? multivitamin (RENA-VIT) tablet 1 tablet  1 tablet Oral QHS Florencia Reasons, MD   1 tablet at 02/04/22 2138  ? mupirocin ointment (BACTROBAN) 2 % 1 application.  1 application. Nasal BID Minor, Grace Bushy, NP   1 application. at 02/04/22 2138  ? nitroGLYCERIN 50 mg in dextrose 5 % 250 mL (0.2 mg/mL) infusion  0-200 mcg/min Intravenous Titrated Minor, Grace Bushy, NP 19.5 mL/hr at 02/05/22 0800 65 mcg/min at 02/05/22 0800  ? oxyCODONE-acetaminophen (PERCOCET/ROXICET) 5-325 MG per tablet 1 tablet  1 tablet Oral Q6H PRN Florencia Reasons, MD   1 tablet at 02/04/22 2032  ? pentafluoroprop-tetrafluoroeth (GEBAUERS) aerosol 1 application.  1 application. Topical PRN Gean Quint, MD      ? pregabalin (LYRICA) capsule 25 mg  25 mg Oral QHS Florencia Reasons, MD   25 mg at 02/04/22 2138  ? senna-docusate (Senokot-S) tablet 1 tablet  1 tablet Oral Daily PRN Florencia Reasons, MD      ? sevelamer carbonate (RENVELA) tablet 2,400 mg  2,400 mg Oral TID WC Florencia Reasons, MD   2,400 mg at 02/05/22 0800  ? sevelamer carbonate (RENVELA) tablet 800 mg  800 mg Oral With snacks Levada Dy, Dwayne A, RPH   800 mg at 02/04/22 1400  ?  ? ? ?Review of Systems: ?10 systems  reviewed and negative except per interval history/subjective ? ?Physical Exam: ?Vitals:  ? 02/05/22 0845 02/05/22 0900  ?BP: (!) 190/63 (!) 110/52  ?Pulse: 76 80  ?Resp: 14 14  ?Temp:    ?SpO2: 98%   ? ?Total I/O ?In: 289 [P.O.:250; I.V.:39] ?Out: -  ? ?  Intake/Output Summary (Last 24 hours) at 02/05/2022 0912 ?Last data filed at 02/05/2022 0800 ?Gross per 24 hour  ?Intake 1186.9 ml  ?Output 1 ml  ?Net 1185.9 ml  ? ?Constitutional: Chronically ill-appearing, no distress ?ENMT: ears and nose without scars or lesions, MMM ?CV: normal rate, no audible murmur ?Respiratory: Bilateral chest rise, normal work of breathing ?Gastrointestinal: soft, nondistended ?Psych: alert, appropriate mood and affect ? ? ?Test Results ?I personally reviewed new and old clinical labs and radiology tests ?Lab Results  ?Component Value Date  ? NA 136 02/05/2022  ? K 4.1 02/05/2022  ? CL 102 02/05/2022  ? CO2 25 02/05/2022  ? BUN 35 (H) 02/05/2022  ? CREATININE 6.48 (H) 02/05/2022  ? CALCIUM 9.4 02/05/2022  ? ALBUMIN 2.7 (L) 02/05/2022  ? PHOS 4.6 02/05/2022  ? ? ?CBC ?Recent Labs  ?Lab 02/04/22 ?1328 02/05/22 ?0147 02/05/22 ?0703  ?WBC 18.2* 14.6* 14.1*  ?NEUTROABS  --  9.8*  --   ?HGB 11.0* 9.6* 9.9*  ?HCT 36.6 32.9* 32.8*  ?MCV 85.5 86.8 86.5  ?PLT 405* 457* 454*  ? ? ? ? ? ?

## 2022-02-05 NOTE — Consult Note (Signed)
? ?  [facility] CM Inpatient Consult ? ? ?02/05/2022 ? ?Melvia Heaps ?[birth year] ?[id number] ? ?[facility] [ACO] Patient: [employer] ? ?Primary Care Provider:  [name], FNP ? ?Pending [facility]N status:  Insurance referral ? ?Patient is currently in a pending status with [facility] for chronic disease management services.  Patient has had an attempted telephonic outreach  by a [place].  Our community based plan of care has focused on disease management and community resource support.   ? ?Patient is currently admitted to [facility] in ICU level of care. ? ?Plan: Will continue to follow for progress and post hospital follow up needs and to make aware that [facility] following, as appropriate.  ? ?Of note, [facility] Care Management services does not replace or interfere with any services that are needed or arranged by inpatient [facility] care management team.  For additional questions or referrals please contact: ? ?Natividad Brood, RN BSN CCM ?[facility] Liaison ? [phone] business mobile phone ?Toll free office [phone]  ?Fax number: [phone] ?[email or web address] ? ? ? ? ?

## 2022-02-05 NOTE — Progress Notes (Addendum)
? ?NAME:  Tammy Moses, MRN:  [id number], DOB:  Dec 19, 1965, LOS: 1 ?ADMISSION DATE:  02/04/2022, CONSULTATION DATE:  02/04/22 ?REFERRING MD:  [facility], CHIEF COMPLAINT:  HTN   ? ?History of Present Illness:  ? ?56 year old female with a plethora of health issues who was in short stay.  PMH significant for HTN, IDDM2, Glaucoma and blind right eye, ESRD on HD MWF, PAD s/p right AKA. In preparation for vascular surgery 02/04/2022 when she is found to be hypertensive despite pharmaceutical interventions.  She was started on a nitroglycerin drip for initial blood pressure of 264/140 and pulmonary critical care was asked to admit to the care unit.  She has an extensive past medical history is well-documented below.  He is awake and follows commands she does not report chest pain at the time of my evaluation although she had earlier and is on nitroglycerin drip.  She is currently on room air with adequate O2 saturations.  Her blood pressure has been coming down to an in the evaluation she was 190/94 where she had been in the 220s on my arrival.  She will be transferred to intensive care unit restarted on all her home antihypertensives and once her blood pressure is controlled she is also nitroglycerin drip she will be transferred out of the intensive care unit back to [facility] hospitalist service. ? ?Pertinent  Medical History  ?Anemia, Anxiety, Blind right eye, Chronic kidney disease, stage V ([place]), Confusion, Constipation, chronic, Dental caries, Depression, Diabetes mellitus, Diabetic neuropathy ([place]), Diabetic retinopathy, GERD (gastroesophageal reflux disease), Glaucoma, H. pylori infection, Hepatitis C carrier ([place]), High risk sexual behavior, Hyperlipidemia, Hypertension, Hypoglycemia (07/12/2017), Insomnia, Microalbuminuria, Nonspecific elevation of levels of transaminase or lactic acid dehydrogenase (LDH), Tobacco dependence, and Vitamin D deficiency.  ? ?Significant Hospital Events: ?Including procedures, antibiotic  start and stop dates in addition to other pertinent events   ?4/13 Admitted to PCCM for severe hypertensive  ? ?Interim History / Subjective:  ? ?BP labile over the last 24 hours, when I examined her it was after nitro gtt was discontinued and she had received a prn of Hydral. Plans to switch to Cleviprex but has not been started  ? ?Did not complain of any pain to me, had been reporting pain to nurse. Did recently receive a dose of Oxy  ? ?Labs: WBC 14.1, hgb 9.9, platelets 454 ?BUN 35, Cr 6.48, GFR 7, phos 4.7  ?Heb B surface Ab positive, Ag neg  ?MRSA swab positive  ? ?Objective   ?Blood pressure (!) 132/48, pulse 76, temperature 98.3 ?F (36.8 ?C), resp. rate 13, height 5\' 1"  (1.549 m), weight 46.6 kg, SpO2 97 %. ?   ?   ? ?Intake/Output Summary (Last 24 hours) at 02/05/2022 1117 ?Last data filed at 02/05/2022 1100 ?Gross per 24 hour  ?Intake 1204.59 ml  ?Output 1 ml  ?Net 1203.59 ml  ? ?Filed Weights  ? 02/04/22 0745 02/05/22 0655  ?Weight: 41.7 kg 46.6 kg  ? ? ?Examination: ?General: chronically ill appearing, frail, NAD  ?HENT: MMM. Glaucoma  ?Lungs: CTAB normal WOB ?Cardiovascular: RRR. Murmur appreicated  ?Abdomen: soft, non tender, non distended  ?Extremities: right AKA, L 1st digit wound. No palpable distal pulse  ?Neuro: alert. No focal deficits. Mild confusion ? ? ?Assessment & Plan:  ? ?Severe HTN ?Hx chronic HTN  ?On admission systolic BP 053 to 976 . Was started on nitro gtt which was discontinued this morning. Currently BP 120/41 MAP 61 ?Received 2 doses of hydral 100mg , 1  dose 10mg , labetalol 200mg  BID  and was restarted on home amlodipine 10mg , Losartan 50mg   ?- continue Hydral prn  ?- continue amlodipine 10mg  daily  ?- continue Losartan ?- continue lasix 80mg  BID  ?- start Cleviprex gtt ? ?PAD s/p right AKA w/ nonhealing wound/gangrene ?Vascular surgery following, will need debridement/angio once BP stabilized  ?- vascular plans on treating wound next week at follow up once medically optimized   ? ?ESRD (MWF) ?Received dialysis today. BP dropped during dialysis, nitro turned off.  ?- Nephrology consulted, appreciate continued care and recommendations  ? ?Anemia of chronic kidney disease ?Hgb 9.9. Receives Veonfer and Epo, Nephro plans on resuming meds if hgb <10 tomorrow  ? ?DM2 ?Appears to be on sliding scale insulin at home ?- very sensitive SSI with CBG checks q ac,hs  ? ?Chronic pain  ?Diabetic neuropathy ?Received Dilaudid .5mg  twice yesterday, once today.  Home Lyrica 25mg  and Percocet restarted  ?- continue Percocet 5-325mg  q6prn for severe pain ?- continue Lyrica   ? 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 ?Anxiety and depression ?- Continue bupropion  ? ?Goals of care ?Previously at [facility] but niece was worried about care there and has been caring for her though it is difficult for her to care for her. Would benefit from further discussion of [place]  ? ?Best Practice (right click and "Reselect all SmartList Selections" daily)  ? ?Diet/type: Regular consistency (see orders) ?DVT prophylaxis: prophylactic heparin  ?GI prophylaxis: PPI ?Lines: N/A ?Foley:  N/A ?Code Status:  full code ? ?Labs   ?CBC: ?Recent Labs  ?Lab 02/04/22 ?1328 02/05/22 ?0147 02/05/22 ?0703  ?WBC 18.2* 14.6* 14.1*  ?NEUTROABS  --  9.8*  --   ?HGB 11.0* 9.6* 9.9*  ?HCT 36.6 32.9* 32.8*  ?MCV 85.5 86.8 86.5  ?PLT 405* 457* 454*  ? ? ?Basic Metabolic Panel: ?Recent Labs  ?Lab 02/04/22 ?1105 02/05/22 ?0147 02/05/22 ?0703  ?NA 144 136 136  ?K 4.0 3.9 4.1  ?CL 107 99 102  ?CO2 24 27 25   ?GLUCOSE 134* 100* 166*  ?BUN 23* 33* 35*  ?CREATININE 4.92* 6.15* 6.48*  ?CALCIUM 9.7 9.4 9.4  ?PHOS  --  4.7* 4.6  ? ?GFR: ?Estimated Creatinine Clearance: 7 mL/min (A) (by C-G formula based on SCr of 6.48 mg/dL (H)). ?Recent Labs  ?Lab 02/04/22 ?1328 02/05/22 ?0147 02/05/22 ?0703  ?WBC 18.2* 14.6* 14.1*  ? ? ?Liver Function Tests: ?Recent Labs  ?Lab 02/05/22 ?0147 02/05/22 ?0703  ?AST 17  --   ?ALT 16  --   ?ALKPHOS 93  --   ?BILITOT 0.5   --   ?PROT 6.7  --   ?ALBUMIN 2.6* 2.7*  ? ?No results for input(s): LIPASE, AMYLASE in the last 168 hours. ?No results for input(s): AMMONIA in the last 168 hours. ? ?ABG ?   ?Component Value Date/Time  ? HCO3 19.0 (L) 09/30/2021 1106  ? TCO2 30 12/21/2021 0924  ? ACIDBASEDEF 5.0 (H) 09/30/2021 1106  ? O2SAT 79.0 09/30/2021 1106  ?  ? ?Coagulation Profile: ?No results for input(s): INR, PROTIME in the last 168 hours. ? ?Cardiac Enzymes: ?No results for input(s): CKTOTAL, CKMB, CKMBINDEX, TROPONINI in the last 168 hours. ? ?HbA1C: ?HbA1c, POC (prediabetic range)  ?Date/Time Value Ref Range Status  ?07/17/2019 11:35 AM 5.2 (A) 5.7 - 6.4 % Final  ? ?HbA1c, POC (controlled diabetic range)  ?Date/Time Value Ref Range Status  ?07/17/2019 11:35 AM 5.2 0.0 - 7.0 % Final  ? ?  HbA1c POC (<> result, manual entry)  ?Date/Time Value Ref Range Status  ?07/17/2019 11:35 AM 5.2 4.0 - 5.6 % Final  ? ?Hgb A1c MFr Bld  ?Date/Time Value Ref Range Status  ?11/15/2021 01:08 AM 5.5 4.8 - 5.6 % Final  ?  Comment:  ?  (NOTE) ?        Prediabetes: 5.7 - 6.4 ?        Diabetes: >6.4 ?        Glycemic control for adults with diabetes: <7.0 ?  ?09/30/2021 09:18 PM 5.9 (H) 4.8 - 5.6 % Final  ?  Comment:  ?  (NOTE) ?Pre diabetes:          5.7%-6.4% ? ?Diabetes:              >6.4% ? ?Glycemic control for   <7.0% ?adults with diabetes ?  ? ? ?CBG: ?Recent Labs  ?Lab 02/04/22 ?6415 02/04/22 ?0953 02/04/22 ?1141 02/04/22 ?1544  ?GLUCAP 151* 145* 133* 170*  ? ?Past Medical History:  ?She,  has a past medical history of Anemia, Anxiety, Blind right eye, Chronic kidney disease, stage V ([place]), Confusion, Constipation, chronic, Dental caries, Depression, Diabetes mellitus, Diabetic neuropathy ([place]), Diabetic retinopathy, GERD (gastroesophageal reflux disease), Glaucoma, H. pylori infection, Hepatitis C carrier ([place]), High risk sexual behavior, Hyperlipidemia, Hypertension, Hypoglycemia (07/12/2017), Insomnia, Microalbuminuria, Nonspecific elevation of  levels of transaminase or lactic acid dehydrogenase (LDH), Tobacco dependence, and Vitamin D deficiency.  ? ?Surgical History:  ? ?Past Surgical History:  ?Procedure Laterality Date  ? ABDOMINAL AORTOGRAM W/LOWER

## 2022-02-06 DIAGNOSIS — E119 Type 2 diabetes mellitus without complications: Secondary | ICD-10-CM

## 2022-02-06 DIAGNOSIS — S78111A Complete traumatic amputation at level between right hip and knee, initial encounter: Secondary | ICD-10-CM

## 2022-02-06 DIAGNOSIS — I160 Hypertensive urgency: Secondary | ICD-10-CM

## 2022-02-06 DIAGNOSIS — Z794 Long term (current) use of insulin: Secondary | ICD-10-CM

## 2022-02-06 DIAGNOSIS — R627 Adult failure to thrive: Secondary | ICD-10-CM

## 2022-02-06 DIAGNOSIS — N186 End stage renal disease: Secondary | ICD-10-CM | POA: Diagnosis not present

## 2022-02-06 DIAGNOSIS — I739 Peripheral vascular disease, unspecified: Secondary | ICD-10-CM | POA: Diagnosis not present

## 2022-02-06 LAB — TRIGLYCERIDES: Triglycerides: 76 mg/dL (ref <150)

## 2022-02-06 LAB — HEPATITIS B SURFACE ANTIBODY, QUANTITATIVE: Hep B S AB Quant (Post): 73.1 m[IU]/mL (ref >9.9)

## 2022-02-06 LAB — GLUCOSE, CAPILLARY
Glucose-Capillary: 142 mg/dL — ABNORMAL HIGH (ref 70–99)
Glucose-Capillary: 187 mg/dL — ABNORMAL HIGH (ref 70–99)
Glucose-Capillary: 157 mg/dL — ABNORMAL HIGH (ref 70–99)
Glucose-Capillary: 268 mg/dL — ABNORMAL HIGH (ref 70–99)

## 2022-02-06 NOTE — Progress Notes (Signed)
Nephrology Follow-Up Consult note ? ? ?Assessment/Recommendations: Tammy Moses is a/an 56 y.o. female with a past medical history significant for ESRD on HD, PAD, DM2, HTN initially presented for outpatient elective wound debridement and angio however was found to have severe uncontrolled HTN.  ?  ?# ESRD:  ?-outpatient orders: [facility].  MWF.  [id number].  3.5 hours.  350/600.  2K, 2.5 Cal.  LUE AVF.  Heparin 1500 units bolus, 500 units maintenance.  Venofer 50 mg q. treatment, EPO 11,800 units, Sensipar 30 q. Mondays ?-Continue MWF schedule ? ?The patient is stable for discharge from nephrology perspective.  Blood pressure much improved ?  ?# Volume/ severe hypertension: EDW 43.5kg. Attempt to achieve EDW as tolerated ?-Continue labetalol and hydral, losartan, Lasix.  Blood pressure much improved ?  ?# PAD s/p right AKA w/ nonhealing wound/gangrene, leukocytosis ?-per VVS, needs eventual debridement/angio once more stabilized from a BP standpoint. Probably next week ?-abx per primary service if indicated ?  ?# Anemia of Chronic Kidney Disease: Hemoglobin 9.9. Currently receives venofer and epo as above. Can resume meds if hgb <10 tomorrow ?  ?# Secondary Hyperparathyroidism/Hyperphosphatemia: resume home binders and sensipar  ?  ?# Vascular access: LUE AVF ? ?Patient's blood pressure is significantly improved.  We do not have any needs for the patient remain inpatient at this time ?  ?# Additional recommendations: ?- Dose all meds for creatinine clearance < 10 ml/min  ?- Unless absolutely necessary, no MRIs with gadolinium.  ?- Implement save arm precautions.  Prefer needle sticks in the dorsum of the hands or wrists.  No blood pressure measurements in arm. ?- If blood transfusion is requested during hemodialysis sessions, please alert [place] prior to the session.  ?- If a hemodialysis catheter line culture is requested, please alert [place] as only hemodialysis nurses are able to collect those specimens.   ? ? ?Recommendations conveyed to primary service.  ? ? ?Reesa Chew ?[facility] ?02/06/2022 ?9:15 AM ? ?___________________________________________________________ ? ?CC: ESRD ? ?Interval History/Subjective: Patient is sitting in bed and has no complaints.  Blood pressure overall improved ? ? ?Medications:  ?Current Facility-Administered Medications  ?Medication Dose Route Frequency Provider Last Rate Last Admin  ? 0.9 %  sodium chloride infusion  100 mL Intravenous PRN Gean Quint, MD      ? 0.9 %  sodium chloride infusion  100 mL Intravenous PRN Gean Quint, MD      ? acetaminophen (TYLENOL) tablet 650 mg  650 mg Oral Q4H PRN Florencia Reasons, MD      ? albuterol (PROVENTIL) (2.5 MG/3ML) 0.083% nebulizer solution 3 mL  3 mL Inhalation Q4H PRN Florencia Reasons, MD      ? alteplase (CATHFLO ACTIVASE) injection 2 mg  2 mg Intracatheter Once PRN Gean Quint, MD      ? amLODipine (NORVASC) tablet 10 mg  10 mg Oral Daily Florencia Reasons, MD   10 mg at 02/06/22 0809  ? aspirin EC tablet 81 mg  81 mg Oral Daily Florencia Reasons, MD   81 mg at 02/06/22 9811  ? atorvastatin (LIPITOR) tablet 80 mg  80 mg Oral Daily Florencia Reasons, MD   80 mg at 02/06/22 0809  ? atropine 1 % ophthalmic solution 1 drop  1 drop Both Eyes BID Florencia Reasons, MD   1 drop at 02/05/22 2159  ? buPROPion (WELLBUTRIN SR) 12 hr tablet 150 mg  150 mg Oral Daily Florencia Reasons, MD   150 mg at 02/06/22 9147  ?  Chlorhexidine Gluconate Cloth 2 % PADS 6 each  6 each Topical Q0600 Gean Quint, MD   6 each at 02/06/22 0501  ? dorzolamide-timolol (COSOPT) 22.3-6.8 MG/ML ophthalmic solution 1 drop  1 drop Both Eyes BID Florencia Reasons, MD   1 drop at 02/05/22 2159  ? furosemide (LASIX) tablet 80 mg  80 mg Oral BID Reesa Chew, MD   80 mg at 02/06/22 0809  ? heparin injection 1,000 Units  1,000 Units Dialysis PRN Gean Quint, MD      ? heparin injection 1,500 Units  1,500 Units Dialysis PRN Gean Quint, MD      ? heparin injection 5,000 Units  5,000 Units Subcutaneous Camelia Phenes Florencia Reasons, MD    5,000 Units at 02/06/22 4403  ? hydrALAZINE (APRESOLINE) injection 10 mg  10 mg Intravenous Q8H PRN Florencia Reasons, MD   10 mg at 02/05/22 1119  ? hydrALAZINE (APRESOLINE) tablet 100 mg  100 mg Oral Q8H Florencia Reasons, MD   100 mg at 02/06/22 4742  ? HYDROmorphone (DILAUDID) injection 0.5 mg  0.5 mg Intravenous Q4H PRN Florencia Reasons, MD   0.5 mg at 02/05/22 0423  ? insulin aspart (novoLOG) injection 0-6 Units  0-6 Units Subcutaneous TID WC Shary Key, DO   1 Units at 02/05/22 1747  ? labetalol (NORMODYNE) injection 10 mg  10 mg Intravenous Q2H PRN Florencia Reasons, MD   10 mg at 02/05/22 0405  ? labetalol (NORMODYNE) tablet 200 mg  200 mg Oral BID Florencia Reasons, MD   200 mg at 02/06/22 5956  ? latanoprost (XALATAN) 0.005 % ophthalmic solution 1 drop  1 drop Both Eyes QHS Florencia Reasons, MD   1 drop at 02/05/22 2159  ? lidocaine (PF) (XYLOCAINE) 1 % injection 5 mL  5 mL Intradermal PRN Gean Quint, MD      ? lidocaine-prilocaine (EMLA) cream 1 application.  1 application. Topical PRN Gean Quint, MD      ? losartan (COZAAR) tablet 50 mg  50 mg Oral Daily Reesa Chew, MD   50 mg at 02/06/22 3875  ? metoCLOPramide (REGLAN) tablet 10 mg  10 mg Oral TID Marcelle Overlie, MD   10 mg at 02/06/22 6433  ? multivitamin (RENA-VIT) tablet 1 tablet  1 tablet Oral Vonzell Schlatter, MD   1 tablet at 02/05/22 2158  ? mupirocin ointment (BACTROBAN) 2 % 1 application.  1 application. Nasal BID Minor, Grace Bushy, NP   1 application. at 02/05/22 2200  ? oxyCODONE-acetaminophen (PERCOCET/ROXICET) 5-325 MG per tablet 1 tablet  1 tablet Oral Q6H PRN Florencia Reasons, MD   1 tablet at 02/06/22 0501  ? pentafluoroprop-tetrafluoroeth (GEBAUERS) aerosol 1 application.  1 application. Topical PRN Gean Quint, MD      ? pregabalin (LYRICA) capsule 25 mg  25 mg Oral QHS Florencia Reasons, MD   25 mg at 02/05/22 2157  ? senna-docusate (Senokot-S) tablet 1 tablet  1 tablet Oral Daily PRN Florencia Reasons, MD      ? sevelamer carbonate (RENVELA) tablet 2,400 mg  2,400 mg Oral TID WC Florencia Reasons, MD    2,400 mg at 02/06/22 0810  ? sevelamer carbonate (RENVELA) tablet 800 mg  800 mg Oral PRN Spero Geralds, MD   800 mg at 02/05/22 1635  ?  ? ? ?Review of Systems: ?10 systems reviewed and negative except per interval history/subjective ? ?Physical Exam: ?Vitals:  ? 02/06/22 0755 02/06/22 0808  ?BP: (!) 169/54   ?Pulse:  78  ?  Resp: 20   ?Temp: 98.3 ?F (36.8 ?C)   ?SpO2:    ? ?No intake/output data recorded. ? ?Intake/Output Summary (Last 24 hours) at 02/06/2022 0915 ?Last data filed at 02/06/2022 0030 ?Gross per 24 hour  ?Intake 859.21 ml  ?Output 3520 ml  ?Net -2660.79 ml  ? ?Constitutional: Chronically ill-appearing, no distress ?ENMT: ears and nose without scars or lesions, MMM ?CV: normal rate, no audible rub ?Respiratory: Bilateral chest rise, normal work of breathing ?Gastrointestinal: soft, nondistended ?Psych: alert, appropriate mood and affect ? ? ?Test Results ?I personally reviewed new and old clinical labs and radiology tests ?Lab Results  ?Component Value Date  ? NA 136 02/05/2022  ? K 4.1 02/05/2022  ? CL 102 02/05/2022  ? CO2 25 02/05/2022  ? BUN 35 (H) 02/05/2022  ? CREATININE 6.48 (H) 02/05/2022  ? CALCIUM 9.4 02/05/2022  ? ALBUMIN 2.7 (L) 02/05/2022  ? PHOS 4.6 02/05/2022  ? ? ?CBC ?Recent Labs  ?Lab 02/04/22 ?1328 02/05/22 ?0147 02/05/22 ?0703  ?WBC 18.2* 14.6* 14.1*  ?NEUTROABS  --  9.8*  --   ?HGB 11.0* 9.6* 9.9*  ?HCT 36.6 32.9* 32.8*  ?MCV 85.5 86.8 86.5  ?PLT 405* 457* 454*  ? ? ? ? ? ?

## 2022-02-06 NOTE — Progress Notes (Signed)
?PROGRESS NOTE ? ? ? GLORY GRAEFE  TDV:[id number] DOB: 05-14-1966 DOA: 02/04/2022 ?PCP: [name], FNP  ? ? ? ?Brief Narrative:  ?Tammy Moses is a 56 y.o. female with medical history significant of HTN, IDDM2, right eye blind , ESRD on HD MWF, PAD s/p recent right AKA, with nonhealing wound came to [facility] outpatient surgery on 4/13 for elective right stump wound debridment and angiogram, preop she is found to have severe hypertension, sbp 240-250, she is started on nitrodrip and admitted to ICU ?  ?Family also concerned about FTT, intermittent confusion at home, per chart review, this is her 8th hospitalization since 09/2021 ? ? ?Subjective: ? ?She is  off nitrodrip and  transferred out of ICU , blood pressure has much improved  ?She denies any complaints, she states she did not feel bad when her blood pressure was elevated on 4/13 ,she just felt tired that day ? ?Assessment & Plan: ? Principal Problem: ?  Hypertension ?Active Problems: ?  HTN (hypertension) ? ? ?HTN urgency ?Required nitrodrip and icu admission ?Much improved, now back on home meds ? ?ESRD on HD ?Management per nephrology ? ?IDDM2, right eye blind ?On ssi ? ?PAD status post right AKA/nonhealing wound ?Vascular surgery on board ?  ?FTT: intermittent confusion at home, per chart review, this is her 8th hospitalization since 09/2021 ?May need snf placement at discharge ? ? ? ?Nutritional Assessment: ?The patient?s BMI is: Body mass index is 17.91 kg/m?Marland KitchenMarland Kitchen ?Seen by dietician.  I agree with the assessment and plan as outlined below: ?Nutrition Status: ?  ?  ?  ? ?Skin Assessment: ?I have examined the patient?s skin and I agree with the wound assessment as performed by the wound care RN as outlined below: ?Pressure Injury 02/04/22 Heel Left boggy heel (Active)  ?02/04/22 2000  ?Location: Heel  ?Location Orientation: Left  ?Staging:   ?Wound Description (Comments): boggy heel  ?Present on Admission: Yes  ?Dressing Type None 02/06/22 0740   ? ? ? ?I have Reviewed nursing notes, Vitals, pain scores, I/o's, Lab results and  imaging results since pt's last encounter, details please see discussion above  ?I ordered the following labs:  ?Unresulted Labs (From admission, onward)  ? ?  Start     Ordered  ? 02/06/22 0500  Triglycerides  Every 72 hours,   R     ?Comments: While on Cleviprex ?  ? 02/05/22 1147  ? ?  ?  ? ?  ? ? ? ?DVT prophylaxis: heparin injection 5,000 Units Start: 02/04/22 1400 ? ? ?Code Status:   Code Status: Full Code ? ?Family Communication: patient  ?Disposition:  ? ?Status is: Inpatient ? ?Dispo: The patient is from: home, lives with niece  ?             Anticipated d/c is to: TBD ?             Anticipated d/c date is: TBD ? ?Antimicrobials:   ? ? ?Anti-infectives (From admission, onward)  ? ? Start     Dose/Rate Route Frequency Ordered Stop  ? 02/04/22 0735  ceFAZolin (ANCEF) 2-4 GM/100ML-% IVPB       ?Note to Pharmacy: [facility], Alvis Lemmings: cabinet override  ?    02/04/22 0735 02/04/22 1944  ? 02/04/22 0732  ceFAZolin (ANCEF) IVPB 2g/100 mL premix  Status:  Discontinued       ? 2 g ?200 mL/hr over 30 Minutes Intravenous 30 min pre-op 02/04/22 0732 02/04/22 1201  ? ?  ? ? ? ? ?  Objective: ?Vitals:  ? 02/06/22 0336 02/06/22 0755 02/06/22 0808 02/06/22 1518  ?BP: 123/67 (!) 169/54  (!) 138/54  ?Pulse: 76  78 73  ?Resp: 18 20  20   ?Temp: 98.8 ?F (37.1 ?C) 98.3 ?F (36.8 ?C)  98.5 ?F (36.9 ?C)  ?TempSrc: Oral Oral  Oral  ?SpO2: 100%   97%  ?Weight:      ?Height:      ? ? ?Intake/Output Summary (Last 24 hours) at 02/06/2022 1809 ?Last data filed at 02/06/2022 0030 ?Gross per 24 hour  ?Intake 240 ml  ?Output --  ?Net 240 ml  ? ?Filed Weights  ? 02/04/22 0745 02/05/22 0655 02/05/22 1107  ?Weight: 41.7 kg 46.6 kg 43 kg  ? ? ?Examination: ? ?General exam: chronically ill appearing, aaox3, right eye blind ?Respiratory system: Clear to auscultation. Respiratory effort normal. ?Cardiovascular system:  RRR.  ?Gastrointestinal system: Abdomen is nondistended,  soft and nontender.  Normal bowel sounds heard. ?Central nervous system: Alert and orientedx3, No focal neurological deficits. ?Extremities:  no edema, right AKA, left big toe ischemic changes ?Skin: No rash ?Psychiatry: calm and cooperative   ? ? ? ?Data Reviewed: I have personally reviewed  labs and visualized  imaging studies since the last encounter and formulate the plan  ? ? ? ? ? ? ?Scheduled Meds: ? amLODipine  10 mg Oral Daily  ? aspirin EC  81 mg Oral Daily  ? atorvastatin  80 mg Oral Daily  ? atropine  1 drop Both Eyes BID  ? buPROPion  150 mg Oral Daily  ? Chlorhexidine Gluconate Cloth  6 each Topical Q0600  ? dorzolamide-timolol  1 drop Both Eyes BID  ? furosemide  80 mg Oral BID  ? heparin  5,000 Units Subcutaneous Q8H  ? hydrALAZINE  100 mg Oral Q8H  ? insulin aspart  0-6 Units Subcutaneous TID WC  ? labetalol  200 mg Oral BID  ? latanoprost  1 drop Both Eyes QHS  ? losartan  50 mg Oral Daily  ? metoCLOPramide  10 mg Oral TID AC  ? multivitamin  1 tablet Oral QHS  ? mupirocin ointment  1 application. Nasal BID  ? pregabalin  25 mg Oral QHS  ? sevelamer carbonate  2,400 mg Oral TID WC  ? ?Continuous Infusions: ? sodium chloride    ? sodium chloride    ? ? ? LOS: 2 days  ? ? ?Florencia Reasons, MD PhD FACP ?[facility] ? ?Available via Epic secure chat 7am-7pm for nonurgent issues ?Please page for urgent issues ?To page the attending provider between 7A-7P or the covering provider during after hours 7P-7A, please log into the web site [email or web address] and access using universal [place] password for that web site. If you do not have the password, please call the hospital operator. ? ? ? ?02/06/2022, 6:09 PM  ? ? ?

## 2022-02-07 DIAGNOSIS — I160 Hypertensive urgency: Secondary | ICD-10-CM | POA: Diagnosis not present

## 2022-02-07 DIAGNOSIS — N186 End stage renal disease: Secondary | ICD-10-CM | POA: Diagnosis not present

## 2022-02-07 DIAGNOSIS — I739 Peripheral vascular disease, unspecified: Secondary | ICD-10-CM | POA: Diagnosis not present

## 2022-02-07 DIAGNOSIS — E119 Type 2 diabetes mellitus without complications: Secondary | ICD-10-CM | POA: Diagnosis not present

## 2022-02-07 LAB — GLUCOSE, CAPILLARY
Glucose-Capillary: 131 mg/dL — ABNORMAL HIGH (ref 70–99)
Glucose-Capillary: 203 mg/dL — ABNORMAL HIGH (ref 70–99)
Glucose-Capillary: 176 mg/dL — ABNORMAL HIGH (ref 70–99)
Glucose-Capillary: 164 mg/dL — ABNORMAL HIGH (ref 70–99)

## 2022-02-07 MED ORDER — LABETALOL HCL 5 MG/ML IV SOLN: 10.0000 mg | INTRAVENOUS | Status: DC | PRN

## 2022-02-07 MED ORDER — LOSARTAN POTASSIUM 50 MG PO TABS
100.0000 mg | ORAL_TABLET | Freq: Every day | ORAL | Status: DC
  Administered 2022-02-07 – 2022-02-19 (×11): 100 mg via ORAL
  Filled 2022-02-07 (×11): qty 2

## 2022-02-07 MED ORDER — LABETALOL HCL 5 MG/ML IV SOLN
10.0000 mg | INTRAVENOUS | Status: DC | PRN
  Administered 2022-02-07 – 2022-02-19 (×3): 10 mg via INTRAVENOUS
  Filled 2022-02-07 (×3): qty 4

## 2022-02-07 NOTE — Progress Notes (Signed)
?PROGRESS NOTE ? ? ? Tammy Moses  [id number]:[id number] DOB: 12/10/1965 DOA: 02/04/2022 ?PCP: [name], FNP  ? ? ? ?Brief Narrative:  ?Tammy Moses is a 56 y.o. female with medical history significant of HTN, IDDM2, right eye blind , ESRD on HD MWF, PAD s/p recent right AKA, with nonhealing wound came to [facility] outpatient surgery on 4/13 for elective right stump wound debridment and angiogram, preop she is found to have severe hypertension, sbp 240-250, she is started on nitrodrip and admitted to ICU ?  ?Family also concerned about FTT, intermittent confusion at home, per chart review, this is her 8th hospitalization since 09/2021 ? ? ?Subjective: ? ?Blood pressure was elevated overnight again in the 200 range ?Blood pressure this morning has improved ?She denies any complaints ? ?Assessment & Plan: ? Principal Problem: ?  Hypertension ?Active Problems: ?  Blind right eye ?  Controlled type 2 diabetes mellitus with chronic kidney disease on chronic dialysis ([place]) ?  ESRD (end stage renal disease) ([place]) -  Dialyzes at [facility].  On Monday Wednesday Friday. ?  Peripheral arterial disease ([place]) ?  S/P AKA (above knee amputation) unilateral, right ([place]) ?  Hypertensive urgency ? ? ?HTN urgency ?Sbp around 240-250 on 4/13, Required nitrodrip and icu admission ?she states she did not feel bad when her blood pressure was elevated on 4/13 ,she just felt tired that day ?Overall improving, last night was elevated around 200 , now is better, will check plasma metanephrines ?-Nephrology is managing blood pressure medication ? ?PAD status post right AKA/nonhealing wound ?Vascular surgery on board ? ?ESRD on HD MWF ?Management per nephrology ? ?IDDM2, right eye blind ?On ssi ? ?  ?FTT: intermittent confusion at home, per chart review, this is her 8th hospitalization since 09/2021 ?May need snf placement at discharge ? ? ? ?Nutritional Assessment: ?The patient?s BMI is: Body mass index is 17.91 kg/m?Marland KitchenMarland Kitchen ?Seen by  dietician.  I agree with the assessment and plan as outlined below: ?Nutrition Status: ?  ?  ?  ? ?Skin Assessment: ?I have examined the patient?s skin and I agree with the wound assessment as performed by the wound care RN as outlined below: ?Pressure Injury 02/04/22 Heel Left boggy heel (Active)  ?02/04/22 2000  ?Location: Heel  ?Location Orientation: Left  ?Staging:   ?Wound Description (Comments): boggy heel  ?Present on Admission: Yes  ?Dressing Type None 02/06/22 0740  ? ? ? ?I have Reviewed nursing notes, Vitals, pain scores, I/o's, Lab results and  imaging results since pt's last encounter, details please see discussion above  ?I ordered the following labs:  ?Unresulted Labs (From admission, onward)  ? ?  Start     Ordered  ? 02/08/22 0500  Metanephrines, plasma  Tomorrow morning,   R       ?Question:  Specimen collection method  Answer:  Lab=Lab collect  ? 02/07/22 1503  ? 02/08/22 0500  CBC with Differential/Platelet  Tomorrow morning,   R       ?Question:  Specimen collection method  Answer:  Lab=Lab collect  ? 02/07/22 1509  ? 02/08/22 8502  Basic metabolic panel  Tomorrow morning,   R       ?Question:  Specimen collection method  Answer:  Lab=Lab collect  ? 02/07/22 1509  ? 02/06/22 0500  Triglycerides  Every 72 hours,   R     ?Comments: While on Cleviprex ?  ? 02/05/22 1147  ? ?  ?  ? ?  ? ? ? ?  DVT prophylaxis: heparin injection 5,000 Units Start: 02/04/22 1400 ? ? ?Code Status:   Code Status: Full Code ? ?Family Communication: patient  ?Disposition:  ? ?Status is: Inpatient ? ?Dispo: The patient is from: home, lives with niece  ?             Anticipated d/c is to: TBD ?             Anticipated d/c date is: TBD ? ?Antimicrobials:   ? ? ?Anti-infectives (From admission, onward)  ? ? Start     Dose/Rate Route Frequency Ordered Stop  ? 02/04/22 0735  ceFAZolin (ANCEF) 2-4 GM/100ML-% IVPB       ?Note to Pharmacy: [facility], Alvis Lemmings: cabinet override  ?    02/04/22 0735 02/04/22 1944  ? 02/04/22 0732  ceFAZolin  (ANCEF) IVPB 2g/100 mL premix  Status:  Discontinued       ? 2 g ?200 mL/hr over 30 Minutes Intravenous 30 min pre-op 02/04/22 0732 02/04/22 1201  ? ?  ? ? ? ? ?Objective: ?Vitals:  ? 02/07/22 0915 02/07/22 0922 02/07/22 1034 02/07/22 1100  ?BP: (!) 141/76 (!) 141/76 (!) 141/76 (!) 106/40  ?Pulse: 81  81 74  ?Resp: (!) 22   16  ?Temp: 98.5 ?F (36.9 ?C)   98.6 ?F (37 ?C)  ?TempSrc: Oral   Oral  ?SpO2: 95%   (!) 61%  ?Weight:      ?Height:      ? ? ?Intake/Output Summary (Last 24 hours) at 02/07/2022 1509 ?Last data filed at 02/06/2022 2103 ?Gross per 24 hour  ?Intake 240 ml  ?Output --  ?Net 240 ml  ? ?Filed Weights  ? 02/04/22 0745 02/05/22 0655 02/05/22 1107  ?Weight: 41.7 kg 46.6 kg 43 kg  ? ? ?Examination: ? ?General exam: chronically ill appearing, aaox3, right eye blind ?Respiratory system: Clear to auscultation. Respiratory effort normal. ?Cardiovascular system:  RRR.  ?Gastrointestinal system: Abdomen is nondistended, soft and nontender.  Normal bowel sounds heard. ?Central nervous system: Alert and orientedx3, No focal neurological deficits. ?Extremities:  no edema, right AKA, left big toe ischemic changes ?Skin: No rash ?Psychiatry: calm and cooperative   ? ? ? ?Data Reviewed: I have personally reviewed  labs and visualized  imaging studies since the last encounter and formulate the plan  ? ? ? ? ? ? ?Scheduled Meds: ? amLODipine  10 mg Oral Daily  ? aspirin EC  81 mg Oral Daily  ? atorvastatin  80 mg Oral Daily  ? atropine  1 drop Both Eyes BID  ? buPROPion  150 mg Oral Daily  ? Chlorhexidine Gluconate Cloth  6 each Topical Q0600  ? dorzolamide-timolol  1 drop Both Eyes BID  ? furosemide  80 mg Oral BID  ? heparin  5,000 Units Subcutaneous Q8H  ? hydrALAZINE  100 mg Oral Q8H  ? insulin aspart  0-6 Units Subcutaneous TID WC  ? labetalol  200 mg Oral BID  ? latanoprost  1 drop Both Eyes QHS  ? losartan  100 mg Oral Daily  ? metoCLOPramide  10 mg Oral TID AC  ? multivitamin  1 tablet Oral QHS  ? mupirocin  ointment  1 application. Nasal BID  ? pregabalin  25 mg Oral QHS  ? sevelamer carbonate  2,400 mg Oral TID WC  ? ?Continuous Infusions: ? sodium chloride    ? sodium chloride    ? ? ? LOS: 3 days  ? ? ?Florencia Reasons, MD PhD FACP ?[facility] ? ?Available via Epic secure chat 7am-7pm for nonurgent issues ?Please page for urgent issues ?To page the attending provider between 7A-7P or the covering provider during after hours 7P-7A, please log into the web site [email or web address] and access using universal [place] password for that web site. If you do not have the password, please call the hospital operator. ? ? ? ?02/07/2022, 3:09 PM  ? ? ?

## 2022-02-07 NOTE — Progress Notes (Signed)
Nephrology Follow-Up Consult note ? ? ?Assessment/Recommendations: Tammy Moses is a/an 56 y.o. female with a past medical history significant for ESRD on HD, PAD, DM2, HTN initially presented for outpatient elective wound debridement and angio however was found to have severe uncontrolled HTN.  ?  ?# ESRD:  ?-outpatient orders: [facility].  MWF.  [id number].  3.5 hours.  350/600.  2K, 2.5 Cal.  LUE AVF.  Heparin 1500 units bolus, 500 units maintenance.  Venofer 50 mg q. treatment, EPO 11,800 units, Sensipar 30 q. Mondays ?-Continue MWF schedule ?  ?# Volume/ severe hypertension: EDW 43.5kg. Attempt to achieve EDW as tolerated ?-Continue labetalol and hydral, losartan, Lasix.  Blood pressure back up today.  Increase losartan ?  ?# PAD s/p right AKA w/ nonhealing wound/gangrene, leukocytosis ?-per VVS, needs eventual debridement/angio once more stabilized from a BP standpoint. Probably next week ?-abx per primary service if indicated ?  ?# Anemia of Chronic Kidney Disease: Hemoglobin 9.9. Currently receives venofer and epo as above. Can resume meds if hgb <10 tomorrow ?  ?# Secondary Hyperparathyroidism/Hyperphosphatemia: resume home binders and sensipar  ?  ?# Vascular access: LUE AVF ? ?Possibly requiring placement at SNF ?  ?# Additional recommendations: ?- Dose all meds for creatinine clearance < 10 ml/min  ?- Unless absolutely necessary, no MRIs with gadolinium.  ?- Implement save arm precautions.  Prefer needle sticks in the dorsum of the hands or wrists.  No blood pressure measurements in arm. ?- If blood transfusion is requested during hemodialysis sessions, please alert [place] prior to the session.  ?- If a hemodialysis catheter line culture is requested, please alert [place] as only hemodialysis nurses are able to collect those specimens.  ? ? ?Recommendations conveyed to primary service.  ? ? ?Reesa Chew ?[facility] ?02/07/2022 ?9:51  AM ? ?___________________________________________________________ ? ?CC: ESRD ? ?Interval History/Subjective: Patient resting in bed with no complaints.  Blood pressure back up today. ? ?Medications:  ?Current Facility-Administered Medications  ?Medication Dose Route Frequency Provider Last Rate Last Admin  ? 0.9 %  sodium chloride infusion  100 mL Intravenous PRN Gean Quint, MD      ? 0.9 %  sodium chloride infusion  100 mL Intravenous PRN Gean Quint, MD      ? acetaminophen (TYLENOL) tablet 650 mg  650 mg Oral Q4H PRN Florencia Reasons, MD      ? albuterol (PROVENTIL) (2.5 MG/3ML) 0.083% nebulizer solution 3 mL  3 mL Inhalation Q4H PRN Florencia Reasons, MD      ? alteplase (CATHFLO ACTIVASE) injection 2 mg  2 mg Intracatheter Once PRN Gean Quint, MD      ? amLODipine (NORVASC) tablet 10 mg  10 mg Oral Daily Florencia Reasons, MD   10 mg at 02/07/22 0354  ? aspirin EC tablet 81 mg  81 mg Oral Daily Florencia Reasons, MD   81 mg at 02/07/22 6568  ? atorvastatin (LIPITOR) tablet 80 mg  80 mg Oral Daily Florencia Reasons, MD   80 mg at 02/07/22 1275  ? atropine 1 % ophthalmic solution 1 drop  1 drop Both Eyes BID Florencia Reasons, MD   1 drop at 02/07/22 1700  ? buPROPion (WELLBUTRIN SR) 12 hr tablet 150 mg  150 mg Oral Daily Florencia Reasons, MD   150 mg at 02/07/22 1749  ? Chlorhexidine Gluconate Cloth 2 % PADS 6 each  6 each Topical Q0600 Gean Quint, MD   6 each at 02/07/22 0541  ? dorzolamide-timolol (COSOPT)  22.3-6.8 MG/ML ophthalmic solution 1 drop  1 drop Both Eyes BID Florencia Reasons, MD   1 drop at 02/06/22 2159  ? furosemide (LASIX) tablet 80 mg  80 mg Oral BID Reesa Chew, MD   80 mg at 02/07/22 2563  ? heparin injection 1,000 Units  1,000 Units Dialysis PRN Gean Quint, MD      ? heparin injection 1,500 Units  1,500 Units Dialysis PRN Gean Quint, MD      ? heparin injection 5,000 Units  5,000 Units Subcutaneous Camelia Phenes Florencia Reasons, MD   5,000 Units at 02/07/22 0542  ? hydrALAZINE (APRESOLINE) injection 10 mg  10 mg Intravenous Q8H PRN Florencia Reasons, MD   10 mg at  02/05/22 1119  ? hydrALAZINE (APRESOLINE) tablet 100 mg  100 mg Oral Q8H Florencia Reasons, MD   100 mg at 02/07/22 8937  ? HYDROmorphone (DILAUDID) injection 0.5 mg  0.5 mg Intravenous Q4H PRN Florencia Reasons, MD   0.5 mg at 02/05/22 0423  ? insulin aspart (novoLOG) injection 0-6 Units  0-6 Units Subcutaneous TID WC Shary Key, DO   1 Units at 02/06/22 1653  ? labetalol (NORMODYNE) injection 10 mg  10 mg Intravenous Q2H PRN Etta Quill, DO   10 mg at 02/07/22 3428  ? labetalol (NORMODYNE) tablet 200 mg  200 mg Oral BID Florencia Reasons, MD   200 mg at 02/06/22 2155  ? latanoprost (XALATAN) 0.005 % ophthalmic solution 1 drop  1 drop Both Eyes QHS Florencia Reasons, MD   1 drop at 02/06/22 2205  ? lidocaine (PF) (XYLOCAINE) 1 % injection 5 mL  5 mL Intradermal PRN Gean Quint, MD      ? lidocaine-prilocaine (EMLA) cream 1 application.  1 application. Topical PRN Gean Quint, MD      ? losartan (COZAAR) tablet 100 mg  100 mg Oral Daily Reesa Chew, MD   100 mg at 02/07/22 7681  ? metoCLOPramide (REGLAN) tablet 10 mg  10 mg Oral TID Marcelle Overlie, MD   10 mg at 02/07/22 1572  ? multivitamin (RENA-VIT) tablet 1 tablet  1 tablet Oral Vonzell Schlatter, MD   1 tablet at 02/06/22 2154  ? mupirocin ointment (BACTROBAN) 2 % 1 application.  1 application. Nasal BID Minor, Grace Bushy, NP   1 application. at 02/07/22 0924  ? oxyCODONE-acetaminophen (PERCOCET/ROXICET) 5-325 MG per tablet 1 tablet  1 tablet Oral Q6H PRN Florencia Reasons, MD   1 tablet at 02/07/22 0541  ? pentafluoroprop-tetrafluoroeth (GEBAUERS) aerosol 1 application.  1 application. Topical PRN Gean Quint, MD      ? pregabalin (LYRICA) capsule 25 mg  25 mg Oral QHS Florencia Reasons, MD   25 mg at 02/06/22 2155  ? senna-docusate (Senokot-S) tablet 1 tablet  1 tablet Oral Daily PRN Florencia Reasons, MD      ? sevelamer carbonate (RENVELA) tablet 2,400 mg  2,400 mg Oral TID WC Florencia Reasons, MD   2,400 mg at 02/07/22 6203  ? sevelamer carbonate (RENVELA) tablet 800 mg  800 mg Oral PRN Spero Geralds, MD   800  mg at 02/05/22 1635  ?  ? ? ?Review of Systems: ?10 systems reviewed and negative except per interval history/subjective ? ?Physical Exam: ?Vitals:  ? 02/07/22 0915 02/07/22 0922  ?BP: (!) 141/76 (!) 141/76  ?Pulse: 81   ?Resp: (!) 22   ?Temp: 98.5 ?F (36.9 ?C)   ?SpO2: 95%   ? ?No intake/output data recorded. ? ?Intake/Output Summary (  Last 24 hours) at 02/07/2022 0951 ?Last data filed at 02/06/2022 2103 ?Gross per 24 hour  ?Intake 240 ml  ?Output --  ?Net 240 ml  ? ?Constitutional: Chronically ill-appearing, no distress ?ENMT: ears and nose without scars or lesions, MMM ?CV: normal rate, no audible rub ?Respiratory: Bilateral chest rise, normal work of breathing ?Gastrointestinal: soft, nondistended ?Psych: alert, appropriate mood and affect ? ? ?Test Results ?I personally reviewed new and old clinical labs and radiology tests ?Lab Results  ?Component Value Date  ? NA 136 02/05/2022  ? K 4.1 02/05/2022  ? CL 102 02/05/2022  ? CO2 25 02/05/2022  ? BUN 35 (H) 02/05/2022  ? CREATININE 6.48 (H) 02/05/2022  ? CALCIUM 9.4 02/05/2022  ? ALBUMIN 2.7 (L) 02/05/2022  ? PHOS 4.6 02/05/2022  ? ? ?CBC ?Recent Labs  ?Lab 02/04/22 ?1328 02/05/22 ?0147 02/05/22 ?0703  ?WBC 18.2* 14.6* 14.1*  ?NEUTROABS  --  9.8*  --   ?HGB 11.0* 9.6* 9.9*  ?HCT 36.6 32.9* 32.8*  ?MCV 85.5 86.8 86.5  ?PLT 405* 457* 454*  ? ? ? ? ? ?

## 2022-02-08 DIAGNOSIS — I10 Essential (primary) hypertension: Secondary | ICD-10-CM | POA: Diagnosis not present

## 2022-02-08 LAB — CBC WITH DIFFERENTIAL/PLATELET
Abs Immature Granulocytes: 0.05 10*3/uL (ref 0.00–0.07)
Basophils Absolute: 0.1 10*3/uL (ref 0.0–0.1)
Basophils Relative: 1 %
Eosinophils Absolute: 0.4 10*3/uL (ref 0.0–0.5)
Eosinophils Relative: 3 %
HCT: 34.2 % — ABNORMAL LOW (ref 36.0–46.0)
Hemoglobin: 10.4 g/dL — ABNORMAL LOW (ref 12.0–15.0)
Immature Granulocytes: 1 %
Lymphocytes Relative: 18 %
Lymphs Abs: 2 10*3/uL (ref 0.7–4.0)
MCH: 25.5 pg — ABNORMAL LOW (ref 26.0–34.0)
MCHC: 30.4 g/dL (ref 30.0–36.0)
MCV: 83.8 fL (ref 80.0–100.0)
Monocytes Absolute: 1.9 10*3/uL — ABNORMAL HIGH (ref 0.1–1.0)
Monocytes Relative: 18 %
Neutro Abs: 6.5 10*3/uL (ref 1.7–7.7)
Neutrophils Relative %: 59 %
Platelets: 420 10*3/uL — ABNORMAL HIGH (ref 150–400)
RBC: 4.08 MIL/uL (ref 3.87–5.11)
RDW: 20.2 % — ABNORMAL HIGH (ref 11.5–15.5)
WBC: 10.8 10*3/uL — ABNORMAL HIGH (ref 4.0–10.5)
nRBC: 0 % (ref 0.0–0.2)

## 2022-02-08 LAB — BASIC METABOLIC PANEL
Anion gap: 14 (ref 5–15)
BUN: 72 mg/dL — ABNORMAL HIGH (ref 6–20)
CO2: 21 mmol/L — ABNORMAL LOW (ref 22–32)
Calcium: 9.5 mg/dL (ref 8.9–10.3)
Chloride: 97 mmol/L — ABNORMAL LOW (ref 98–111)
Creatinine, Ser: 8.81 mg/dL — ABNORMAL HIGH (ref 0.44–1.00)
GFR, Estimated: 5 mL/min — ABNORMAL LOW (ref >60)
Glucose, Bld: 160 mg/dL — ABNORMAL HIGH (ref 70–99)
Potassium: 4.3 mmol/L (ref 3.5–5.1)
Sodium: 132 mmol/L — ABNORMAL LOW (ref 135–145)

## 2022-02-08 LAB — GLUCOSE, CAPILLARY
Glucose-Capillary: 156 mg/dL — ABNORMAL HIGH (ref 70–99)
Glucose-Capillary: 156 mg/dL — ABNORMAL HIGH (ref 70–99)
Glucose-Capillary: 210 mg/dL — ABNORMAL HIGH (ref 70–99)
Glucose-Capillary: 231 mg/dL — ABNORMAL HIGH (ref 70–99)

## 2022-02-08 NOTE — Progress Notes (Signed)
?PROGRESS NOTE ? ? Tammy Moses  GYK:[id number] DOB: 07/12/1966 DOA: 02/04/2022 ?PCP: [name], FNP  ?Brief Narrative:  ? 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 ?Represented [employer] ED 02/04/2022 nonhealing right AKA wound and perioperatively was found to have severe hypertension with blood pressures into the 230 range initially admitted by hospitalist service but transferred to ICU on nitroglycerin gtt. ? ?Hospital-Problem based course ? ?Hypertensive urgency on admission requiring previously IV nitroglycerin/Cleviprex drip ?More normotensive-current continue home meds amlodipine 10, hydralazine 100 every 8, labetalol 200 twice daily, losartan 100 ?Unclear if she will take it 3 times daily medication such as hydralazine-monitor for compliance in outpatient setting ?PAD/right AKA recently hospitalized 2/27-3/4-now with nonhealing wound at the stump of AKA ?Discussed with vascular and they will reassess now that patient is hemodynamically more stable ?For angiography later this week? ?ESRD TTS [facility] MWF EDW 43 ?Defer to nephrology phosphorus, iron, dialysis management ?DM TY 2 ?CBGs 1 50-1 60, continuing sliding scale coverage--for neuropathy continue Lyrica 25 at bedtime ?Blind in right eye ? ?DVT prophylaxis: Heparin ?Code Status: Full code ?Family Communication: None present ?Disposition:  ?Status is: Inpatient ?Remains inpatient appropriate because:  ? ?Needs angiography and planning ?  ?Consultants:  ?Critical care ?Renal ?Vascular ? ?Procedures:  ? ?Antimicrobials:   ? ? ?Subjective: ?Awake alert coherent no distress  ?She is having some difficulty monitoring and following visually ? ? ?Objective: ?Vitals:  ? 02/07/22  1100 02/07/22 1548 02/07/22 1759 02/08/22 0229  ?BP: (!) 106/40 (!) 115/40 (!) 155/61 (!) 156/64  ?Pulse: 74 77  72  ?Resp: 16 18  14   ?Temp: 98.6 ?F (37 ?C) 98.6 ?F (37 ?C)  98.2 ?F (36.8 ?C)  ?TempSrc: Oral Oral  Oral  ?SpO2: 100% 96%  98%  ?Weight:      ?Height:      ? ?No intake or output data in the 24 hours ending 02/08/22 0737 ?Filed Weights  ? 02/04/22 0745 02/05/22 0655 02/05/22 1107  ?Weight: 41.7 kg 46.6 kg 43 kg  ? ? ?Examination: ? ?Chest is clear no rales rhonchi ?She seems disheveled and unhealthy ?ROM is grossly intact ?Neurologic.  No focal deficit ? ? ?Data Reviewed: personally reviewed  ? ?CBC ?   ?Component Value Date/Time  ? WBC 10.8 (H) 02/08/2022 0428  ? RBC 4.08 02/08/2022 0428  ? HGB 10.4 (L) 02/08/2022 0428  ? HGB 12.4 12/13/2017 1416  ? HCT 34.2 (L) 02/08/2022 0428  ? HCT 37.8 12/13/2017 1416  ? PLT 420 (H) 02/08/2022 0428  ? PLT 254 12/13/2017 1416  ? MCV 83.8 02/08/2022 0428  ? MCV 83 12/13/2017 1416  ? MCH 25.5 (L) 02/08/2022 0428  ? MCHC 30.4 02/08/2022 0428  ? RDW 20.2 (H) 02/08/2022 0428  ? RDW 15.8 (H) 12/13/2017 1416  ? LYMPHSABS 2.0 02/08/2022 0428  ? LYMPHSABS 4.1 (H) 12/13/2017 1416  ? MONOABS 1.9 (H) 02/08/2022 0428  ? EOSABS 0.4 02/08/2022 0428  ? EOSABS 0.3 12/13/2017 1416  ? BASOSABS 0.1 02/08/2022 0428  ? BASOSABS 0.1 12/13/2017 1416  ? ? ?  Latest Ref Rng & Units 02/08/2022  ?  4:28 AM 02/05/2022  ?  7:03 AM 02/05/2022  ?  1:47 AM  ?CMP  ?Glucose  70 - 99 mg/dL 160   166   100    ?BUN 6 - 20 mg/dL 72   35   33    ?Creatinine 0.44 - 1.00 mg/dL 8.81   6.48   6.15    ?Sodium 135 - 145 mmol/L 132   136   136    ?Potassium 3.5 - 5.1 mmol/L 4.3   4.1   3.9    ?Chloride 98 - 111 mmol/L 97   102   99    ?CO2 22 - 32 mmol/L 21   25   27     ?Calcium 8.9 - 10.3 mg/dL 9.5   9.4   9.4    ?Total Protein 6.5 - 8.1 g/dL   6.7    ?Total Bilirubin 0.3 - 1.2 mg/dL   0.5    ?Alkaline Phos 38 - 126 U/L   93    ?AST 15 - 41 U/L   17    ?ALT 0 - 44 U/L   16    ? ? ? ?Radiology Studies: ?No results  found. ? ? ?Scheduled Meds: ? amLODipine  10 mg Oral Daily  ? aspirin EC  81 mg Oral Daily  ? atorvastatin  80 mg Oral Daily  ? atropine  1 drop Both Eyes BID  ? buPROPion  150 mg Oral Daily  ? Chlorhexidine Gluconate Cloth  6 each Topical Q0600  ? dorzolamide-timolol  1 drop Both Eyes BID  ? furosemide  80 mg Oral BID  ? heparin  5,000 Units Subcutaneous Q8H  ? hydrALAZINE  100 mg Oral Q8H  ? insulin aspart  0-6 Units Subcutaneous TID WC  ? labetalol  200 mg Oral BID  ? latanoprost  1 drop Both Eyes QHS  ? losartan  100 mg Oral Daily  ? metoCLOPramide  10 mg Oral TID AC  ? multivitamin  1 tablet Oral QHS  ? mupirocin ointment  1 application. Nasal BID  ? pregabalin  25 mg Oral QHS  ? sevelamer carbonate  2,400 mg Oral TID WC  ? ?Continuous Infusions: ? sodium chloride    ? sodium chloride    ? ? ? LOS: 4 days  ? ?Time spent: 13 ? ?Nita Sells, MD ?[facility] ?To contact the attending provider between 7A-7P or the covering provider during after hours 7P-7A, please log into the web site [email or web address] and access using universal [place] password for that web site. If you do not have the password, please call the hospital operator. ? ?02/08/2022, 7:37 AM  ? ? ?

## 2022-02-08 NOTE — Progress Notes (Signed)
Nephrology Follow-Up Consult note ? ? ?Assessment/Recommendations: Tammy Moses is a/an 56 y.o. female with a past medical history significant for ESRD on HD, PAD, DM2, HTN initially presented for outpatient elective wound debridement and angio however was found to have severe uncontrolled HTN.  ?  ?# ESRD:  ?-outpatient orders: [facility].  MWF.  [id number].  3.5 hours.  350/600.  2K, 2.5 Cal.  LUE AVF.  Heparin 1500 units bolus, 500 units maintenance.  Venofer 50 mg q. treatment, EPO 11,800 units, Sensipar 30 q. Mondays ?-Continue MWF schedule ?Seen on HD ?2K bath 3L net UF goal through lt arm access ?  ?# Volume/ severe hypertension: EDW 43.5kg. Attempt to achieve EDW as tolerated ?-Continue labetalol and hydral, losartan, Lasix.  Just increased  losartan to 100mg  daily. ?  ?# PAD s/p right AKA w/ nonhealing wound/gangrene, leukocytosis ?-recent rt AKA after unsuccessful revascularization (staples still in ); needs left LE angiogram once more stabilize from a BP standpoint. Should be this week ?-abx per primary service if indicated ?  ?# Anemia of Chronic Kidney Disease: Hemoglobin 9.9. Currently receives venofer and epo as above. Can resume meds if hgb <10 tomorrow ?  ?# Secondary Hyperparathyroidism/Hyperphosphatemia: resume home binders and sensipar  ?  ?# Vascular access: LUE AVF ? ?Possibly requiring placement at SNF ?  ?# Additional recommendations: ?- Dose all meds for creatinine clearance < 10 ml/min  ?- Unless absolutely necessary, no MRIs with gadolinium.  ?- Implement save arm precautions.  Prefer needle sticks in the dorsum of the hands or wrists.  No blood pressure measurements in left arm. ?- If blood transfusion is requested during hemodialysis sessions, please alert [place] prior to the session.  ?- If a hemodialysis catheter line culture is requested, please alert [place] as only hemodialysis nurses are able to collect those specimens.  ? ? ? ?[name] W ?[facility] ?02/08/2022 ?9:35  AM ? ?___________________________________________________________ ? ?CC: ESRD ? ?Interval History/Subjective: Patient resting in bed with no complaints on dialysis ? ?Medications:  ?Current Facility-Administered Medications  ?Medication Dose Route Frequency Provider Last Rate Last Admin  ? 0.9 %  sodium chloride infusion  100 mL Intravenous PRN Gean Quint, MD      ? 0.9 %  sodium chloride infusion  100 mL Intravenous PRN Gean Quint, MD      ? acetaminophen (TYLENOL) tablet 650 mg  650 mg Oral Q4H PRN Florencia Reasons, MD      ? albuterol (PROVENTIL) (2.5 MG/3ML) 0.083% nebulizer solution 3 mL  3 mL Inhalation Q4H PRN Florencia Reasons, MD      ? alteplase (CATHFLO ACTIVASE) injection 2 mg  2 mg Intracatheter Once PRN Gean Quint, MD      ? amLODipine (NORVASC) tablet 10 mg  10 mg Oral Daily Florencia Reasons, MD   10 mg at 02/07/22 3790  ? aspirin EC tablet 81 mg  81 mg Oral Daily Florencia Reasons, MD   81 mg at 02/07/22 2409  ? atorvastatin (LIPITOR) tablet 80 mg  80 mg Oral Daily Florencia Reasons, MD   80 mg at 02/07/22 7353  ? atropine 1 % ophthalmic solution 1 drop  1 drop Both Eyes BID Florencia Reasons, MD   1 drop at 02/07/22 2219  ? buPROPion (WELLBUTRIN SR) 12 hr tablet 150 mg  150 mg Oral Daily Florencia Reasons, MD   150 mg at 02/07/22 2992  ? Chlorhexidine Gluconate Cloth 2 % PADS 6 each  6 each Topical Q0600 Gean Quint,  MD   6 each at 02/08/22 0314  ? dorzolamide-timolol (COSOPT) 22.3-6.8 MG/ML ophthalmic solution 1 drop  1 drop Both Eyes BID Florencia Reasons, MD   1 drop at 02/07/22 2219  ? furosemide (LASIX) tablet 80 mg  80 mg Oral BID Reesa Chew, MD   80 mg at 02/07/22 1759  ? heparin injection 1,000 Units  1,000 Units Dialysis PRN Gean Quint, MD      ? heparin injection 1,500 Units  1,500 Units Dialysis PRN Gean Quint, MD      ? heparin injection 5,000 Units  5,000 Units Subcutaneous Camelia Phenes Florencia Reasons, MD   5,000 Units at 02/08/22 0540  ? hydrALAZINE (APRESOLINE) injection 10 mg  10 mg Intravenous Q8H PRN Florencia Reasons, MD   10 mg at 02/05/22 1119  ?  hydrALAZINE (APRESOLINE) tablet 100 mg  100 mg Oral Q8H Florencia Reasons, MD   100 mg at 02/08/22 9678  ? HYDROmorphone (DILAUDID) injection 0.5 mg  0.5 mg Intravenous Q4H PRN Florencia Reasons, MD   0.5 mg at 02/05/22 0423  ? insulin aspart (novoLOG) injection 0-6 Units  0-6 Units Subcutaneous TID WC Paige, Victoria J, DO   1 Units at 02/07/22 1758  ? labetalol (NORMODYNE) injection 10 mg  10 mg Intravenous Q2H PRN Etta Quill, DO   10 mg at 02/07/22 9381  ? labetalol (NORMODYNE) tablet 200 mg  200 mg Oral BID Florencia Reasons, MD   200 mg at 02/07/22 2218  ? latanoprost (XALATAN) 0.005 % ophthalmic solution 1 drop  1 drop Both Eyes QHS Florencia Reasons, MD   1 drop at 02/07/22 2219  ? lidocaine (PF) (XYLOCAINE) 1 % injection 5 mL  5 mL Intradermal PRN Gean Quint, MD      ? lidocaine-prilocaine (EMLA) cream 1 application.  1 application. Topical PRN Gean Quint, MD      ? losartan (COZAAR) tablet 100 mg  100 mg Oral Daily Reesa Chew, MD   100 mg at 02/07/22 0175  ? metoCLOPramide (REGLAN) tablet 10 mg  10 mg Oral TID Marcelle Overlie, MD   10 mg at 02/07/22 1759  ? multivitamin (RENA-VIT) tablet 1 tablet  1 tablet Oral QHS Florencia Reasons, MD   1 tablet at 02/07/22 2218  ? mupirocin ointment (BACTROBAN) 2 % 1 application.  1 application. Nasal BID Minor, Grace Bushy, NP   1 application. at 02/07/22 2222  ? oxyCODONE-acetaminophen (PERCOCET/ROXICET) 5-325 MG per tablet 1 tablet  1 tablet Oral Q6H PRN Florencia Reasons, MD   1 tablet at 02/07/22 2218  ? pentafluoroprop-tetrafluoroeth (GEBAUERS) aerosol 1 application.  1 application. Topical PRN Gean Quint, MD      ? pregabalin (LYRICA) capsule 25 mg  25 mg Oral QHS Florencia Reasons, MD   25 mg at 02/07/22 2218  ? senna-docusate (Senokot-S) tablet 1 tablet  1 tablet Oral Daily PRN Florencia Reasons, MD      ? sevelamer carbonate (RENVELA) tablet 2,400 mg  2,400 mg Oral TID WC Florencia Reasons, MD   2,400 mg at 02/07/22 1759  ? sevelamer carbonate (RENVELA) tablet 800 mg  800 mg Oral PRN Spero Geralds, MD   800 mg at 02/05/22  1635  ?  ? ? ?Review of Systems: ?10 systems reviewed and negative except per interval history/subjective ? ?Physical Exam: ?Vitals:  ? 02/08/22 0900 02/08/22 0930  ?BP: 131/68 (!) 126/53  ?Pulse: 74 76  ?Resp: 12 15  ?Temp:    ?SpO2:    ? ?  No intake/output data recorded. ?No intake or output data in the 24 hours ending 02/08/22 0935 ? ?Constitutional: Chronically ill-appearing, no distress ?ENMT: ears and nose without scars or lesions, MMM ?CV: normal rate, no audible rub ?Respiratory: Bilateral chest rise, normal work of breathing ?Gastrointestinal: soft, nondistended ?Psych: alert, appropriate mood and affect ?Access: LUA access with needles in place, no infiltration ? ? ?Test Results ?I personally reviewed new and old clinical labs and radiology tests ?Lab Results  ?Component Value Date  ? NA 132 (L) 02/08/2022  ? K 4.3 02/08/2022  ? CL 97 (L) 02/08/2022  ? CO2 21 (L) 02/08/2022  ? BUN 72 (H) 02/08/2022  ? CREATININE 8.81 (H) 02/08/2022  ? CALCIUM 9.5 02/08/2022  ? ALBUMIN 2.7 (L) 02/05/2022  ? PHOS 4.6 02/05/2022  ? ? ?CBC ?Recent Labs  ?Lab 02/05/22 ?0147 02/05/22 ?0703 02/08/22 ?5790  ?WBC 14.6* 14.1* 10.8*  ?NEUTROABS 9.8*  --  6.5  ?HGB 9.6* 9.9* 10.4*  ?HCT 32.9* 32.8* 34.2*  ?MCV 86.8 86.5 83.8  ?PLT 457* 454* 420*  ? ? ? ? ? ?

## 2022-02-08 NOTE — Care Management Important Message (Signed)
Important Message ? ?Patient Details  ?Name: Tammy Moses ?MRN: [id number] ?Date of Birth: [birth year] ? ? ?Medicare Important Message Given:  Yes ? ? ? ? ?[name] ?02/08/2022, 10:36 AM ?

## 2022-02-08 NOTE — Progress Notes (Signed)
?  Progress Note ? ? ? ?02/08/2022 ?6:07 PM ?4 Days Post-Op ? ?I was called to evaluate the patient now that she is out of the ICU. ? ?Subjective: She does not have any complaints other than she wants to walk ? ?Vitals:  ? 02/08/22 1206 02/08/22 1630  ?BP: 116/67 (!) 129/46  ?Pulse: 75 78  ?Resp: 16 16  ?Temp: 98.4 ?F (36.9 ?C) 97.8 ?F (36.6 ?C)  ?SpO2:    ? ? ?Physical Exam: ?Awake alert and oriented ?Healing right above-knee amputation with staples in place and eschar most prominent medially ?Left great toe ulceration ?I cannot reliably feel a left common femoral pulse and there is no left popliteal pulse or pedal pulses ? ?CBC ?   ?Component Value Date/Time  ? WBC 10.8 (H) 02/08/2022 0428  ? RBC 4.08 02/08/2022 0428  ? HGB 10.4 (L) 02/08/2022 0428  ? HGB 12.4 12/13/2017 1416  ? HCT 34.2 (L) 02/08/2022 0428  ? HCT 37.8 12/13/2017 1416  ? PLT 420 (H) 02/08/2022 0428  ? PLT 254 12/13/2017 1416  ? MCV 83.8 02/08/2022 0428  ? MCV 83 12/13/2017 1416  ? MCH 25.5 (L) 02/08/2022 0428  ? MCHC 30.4 02/08/2022 0428  ? RDW 20.2 (H) 02/08/2022 0428  ? RDW 15.8 (H) 12/13/2017 1416  ? LYMPHSABS 2.0 02/08/2022 0428  ? LYMPHSABS 4.1 (H) 12/13/2017 1416  ? MONOABS 1.9 (H) 02/08/2022 0428  ? EOSABS 0.4 02/08/2022 0428  ? EOSABS 0.3 12/13/2017 1416  ? BASOSABS 0.1 02/08/2022 0428  ? BASOSABS 0.1 12/13/2017 1416  ? ? ?BMET ?   ?Component Value Date/Time  ? NA 132 (L) 02/08/2022 0428  ? NA 146 (H) 05/10/2017 0950  ? K 4.3 02/08/2022 0428  ? CL 97 (L) 02/08/2022 0428  ? CO2 21 (L) 02/08/2022 0428  ? GLUCOSE 160 (H) 02/08/2022 0428  ? BUN 72 (H) 02/08/2022 0428  ? BUN 38 (H) 05/10/2017 0950  ? CREATININE 8.81 (H) 02/08/2022 0428  ? CREATININE 3.40 (H) 03/11/2017 1505  ? CALCIUM 9.5 02/08/2022 0428  ? GFRNONAA 5 (L) 02/08/2022 0428  ? GFRNONAA 15 (L) 03/11/2017 1505  ? GFRAA 5 (L) 05/05/2020 1314  ? GFRAA 17 (L) 03/11/2017 1505  ? ? ?INR ?   ?Component Value Date/Time  ? INR 1.2 10/21/2021 1425  ? ? ? ?Intake/Output Summary (Last 24 hours)  at 02/08/2022 1807 ?Last data filed at 02/08/2022 1115 ?Gross per 24 hour  ?Intake --  ?Output 3000 ml  ?Net -3000 ml  ? ? ? ?Assessment/plan:  56 y.o. female is here with end-stage renal disease and previous right above-knee amputation which will likely need debridement.  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  She will need angiography from the right common femoral approach and we will plan this for Wednesday with Dr. Virl Cagey.  I discussed the plan with the patient as well as the primary team and all are on board. ? ? ? ? ?Jondavid Schreier C. Donzetta Matters, MD ?[facility] ?Office: [phone] ?Pager: [phone] ? ?02/08/2022 ?6:07 PM ? ? ?

## 2022-02-08 NOTE — Progress Notes (Signed)
Pt receives out-pt HD at [facility] in [facility] on MWF. Pt has an 11:10 chair time. Will assist as needed.  ? ?Melven Sartorius ?Renal Navigator ?[phone] ?

## 2022-02-09 DIAGNOSIS — I150 Renovascular hypertension: Secondary | ICD-10-CM | POA: Diagnosis not present

## 2022-02-09 LAB — RENAL FUNCTION PANEL
Albumin: 3 g/dL — ABNORMAL LOW (ref 3.5–5.0)
Anion gap: 14 (ref 5–15)
BUN: 43 mg/dL — ABNORMAL HIGH (ref 6–20)
CO2: 24 mmol/L (ref 22–32)
Calcium: 9.6 mg/dL (ref 8.9–10.3)
Chloride: 94 mmol/L — ABNORMAL LOW (ref 98–111)
Creatinine, Ser: 6.59 mg/dL — ABNORMAL HIGH (ref 0.44–1.00)
GFR, Estimated: 7 mL/min — ABNORMAL LOW (ref >60)
Glucose, Bld: 158 mg/dL — ABNORMAL HIGH (ref 70–99)
Phosphorus: 4.4 mg/dL (ref 2.5–4.6)
Potassium: 3.8 mmol/L (ref 3.5–5.1)
Sodium: 132 mmol/L — ABNORMAL LOW (ref 135–145)

## 2022-02-09 LAB — CBC
HCT: 35.9 % — ABNORMAL LOW (ref 36.0–46.0)
Hemoglobin: 10.7 g/dL — ABNORMAL LOW (ref 12.0–15.0)
MCH: 25.2 pg — ABNORMAL LOW (ref 26.0–34.0)
MCHC: 29.8 g/dL — ABNORMAL LOW (ref 30.0–36.0)
MCV: 84.5 fL (ref 80.0–100.0)
Platelets: 374 10*3/uL (ref 150–400)
RBC: 4.25 MIL/uL (ref 3.87–5.11)
RDW: 20 % — ABNORMAL HIGH (ref 11.5–15.5)
WBC: 9.8 10*3/uL (ref 4.0–10.5)
nRBC: 0 % (ref 0.0–0.2)

## 2022-02-09 LAB — TRIGLYCERIDES: Triglycerides: 84 mg/dL (ref <150)

## 2022-02-09 LAB — GLUCOSE, CAPILLARY
Glucose-Capillary: 134 mg/dL — ABNORMAL HIGH (ref 70–99)
Glucose-Capillary: 182 mg/dL — ABNORMAL HIGH (ref 70–99)
Glucose-Capillary: 124 mg/dL — ABNORMAL HIGH (ref 70–99)
Glucose-Capillary: 187 mg/dL — ABNORMAL HIGH (ref 70–99)

## 2022-02-09 MED ORDER — HYDRALAZINE HCL 50 MG PO TABS
150.0000 mg | ORAL_TABLET | Freq: Three times a day (TID) | ORAL | Status: DC
  Administered 2022-02-09 – 2022-02-13 (×9): 150 mg via ORAL
  Filled 2022-02-09 (×10): qty 3

## 2022-02-09 MED ORDER — METOPROLOL TARTRATE 50 MG PO TABS
50.0000 mg | ORAL_TABLET | Freq: Two times a day (BID) | ORAL | Status: DC
  Administered 2022-02-09 – 2022-02-19 (×20): 50 mg via ORAL
  Filled 2022-02-09 (×20): qty 1

## 2022-02-09 NOTE — Progress Notes (Addendum)
CSW received consult from MD for patient for substance use resources. CSW met with patient at bedside. Patient declined substance use resources. Patient reports alcohol use in the past but currently does not drink alcohol. Patient reports she does not use drugs.Patient reports she comes from home.Patient reports her plan at dc is to go home with her mom. Patient is interested in hh services at dc. Patient reports she will have transportation at [employer] by [employer]. Patient reports she has a walker and wheelchair at home. Patient reports she receives outpatient HD at [facility] in [facility] MWF. CSW informed CM. All questions answered no further questions reported at this time. ?

## 2022-02-09 NOTE — Progress Notes (Addendum)
?  Progress Note ? ? ? ?02/09/2022 ?7:37 AM ?5 Days Post-Op ? ?Subjective:  No complaints ? ? ?Vitals:  ? 02/09/22 0035 02/09/22 0442  ?BP: 116/68 (!) 149/67  ?Pulse: 83 76  ?Resp: 16 14  ?Temp: 99.7 ?F (37.6 ?C) 98.7 ?F (37.1 ?C)  ?SpO2: 95% 95%  ? ?Physical Exam ?Lungs:  non labored ?Incisions:  R AKA with some areas slow to heal ?Extremities:  L GT tip dry gangrene ?Neurologic: A&O ? ?CBC ?   ?Component Value Date/Time  ? WBC 9.8 02/09/2022 0422  ? RBC 4.25 02/09/2022 0422  ? HGB 10.7 (L) 02/09/2022 0422  ? HGB 12.4 12/13/2017 1416  ? HCT 35.9 (L) 02/09/2022 0422  ? HCT 37.8 12/13/2017 1416  ? PLT 374 02/09/2022 0422  ? PLT 254 12/13/2017 1416  ? MCV 84.5 02/09/2022 0422  ? MCV 83 12/13/2017 1416  ? MCH 25.2 (L) 02/09/2022 0422  ? MCHC 29.8 (L) 02/09/2022 0422  ? RDW 20.0 (H) 02/09/2022 0422  ? RDW 15.8 (H) 12/13/2017 1416  ? LYMPHSABS 2.0 02/08/2022 0428  ? LYMPHSABS 4.1 (H) 12/13/2017 1416  ? MONOABS 1.9 (H) 02/08/2022 0428  ? EOSABS 0.4 02/08/2022 0428  ? EOSABS 0.3 12/13/2017 1416  ? BASOSABS 0.1 02/08/2022 0428  ? BASOSABS 0.1 12/13/2017 1416  ? ? ?BMET ?   ?Component Value Date/Time  ? NA 132 (L) 02/08/2022 0428  ? NA 146 (H) 05/10/2017 0950  ? K 4.3 02/08/2022 0428  ? CL 97 (L) 02/08/2022 0428  ? CO2 21 (L) 02/08/2022 0428  ? GLUCOSE 160 (H) 02/08/2022 0428  ? BUN 72 (H) 02/08/2022 0428  ? BUN 38 (H) 05/10/2017 0950  ? CREATININE 8.81 (H) 02/08/2022 0428  ? CREATININE 3.40 (H) 03/11/2017 1505  ? CALCIUM 9.5 02/08/2022 0428  ? GFRNONAA 5 (L) 02/08/2022 0428  ? GFRNONAA 15 (L) 03/11/2017 1505  ? GFRAA 5 (L) 05/05/2020 1314  ? GFRAA 17 (L) 03/11/2017 1505  ? ? ?INR ?   ?Component Value Date/Time  ? INR 1.2 10/21/2021 1425  ? ? ? ?Intake/Output Summary (Last 24 hours) at 02/09/2022 0737 ?Last data filed at 02/08/2022 1115 ?Gross per 24 hour  ?Intake --  ?Output 3000 ml  ?Net -3000 ml  ? ? ? ?Assessment/Plan:  56 y.o. 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 ?Plan is for Aortogram with LLE  arteriogram and possible intervention tomorrow 4/19 due to GT gangrene ?This was discussed with the patient and all questions were answered.  Patient agrees to proceed. ?NPO past midnight ?Consent ordered ? ? ?Dagoberto Ligas, PA-C ?[facility] ?[phone] ?02/09/2022 ?7:37 AM ? ? ?VASCULAR STAFF ADDENDUM: ?I have independently interviewed and examined the patient. ?I agree with the above.  ? ?Staples removed, purulence from staples, not surgical incision.  ?Recommend daily wash with soap and water, does not need formal debridement ?Plan for LLE angiogram tomorrow ? ?[name], MD ?[facility] ?Office Phone Number: [phone] ?02/09/2022 8:48 AM ? ? ? ? ? ?

## 2022-02-09 NOTE — Progress Notes (Signed)
?PROGRESS NOTE ? ? Tammy Moses  ZOX:[id number] DOB: December 26, 1965 DOA: 02/04/2022 ?PCP: [name], FNP  ?Brief Narrative:  ? 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 ?Represented [facility] ED 02/04/2022 nonhealing right AKA wound and perioperatively was found to have severe hypertension with blood pressures into the 230 range initially admitted by hospitalist service but transferred to ICU on nitroglycerin gtt. ? ?Hospital-Problem based course ? ?Hypertensive urgency on admission requiring previously IV nitroglycerin/Cleviprex drip ?continue amlodipine 10, increase hydralazine to 150 every 8,  ?labetalol 200 twice daily switched to metoprolol 50 bid ?Continues losartan 100 ?Adjustred meds as above 4/18 ?PAD/right AKA recently hospitalized 2/27-3/4-now with nonhealing wound at the stump of AKA ?Discussed with vascular -for angiography l4/19 am Dr. Unk Lightning ?ESRD TTS [facility] MWF EDW 43 ?Defer to nephrology phosphorus, dialysis management ?Check iron studies in am--havent been checked since 12/22 ?DM TY 2 ?CBGs 134-182, continuing sliding scale coverage--for neuropathy continue Lyrica 25 at bedtime ?Blind in right eye ? ?DVT prophylaxis: Heparin ?Code Status: Full code ?Family Communication: None present ?Disposition:  ?Status is: Inpatient ?Remains inpatient appropriate because:  ? ?Needs angiography and planning ?  ?Consultants:  ?Critical care ?Renal ?Vascular ? ?Procedures:  ? ?Antimicrobials:   ? ? ?Subjective: ?Awake coherent in nad no focal deficit ? ? ?Objective: ?Vitals:  ? 02/09/22 0442 02/09/22 0757 02/09/22 0850 02/09/22 0853  ?BP: (!) 149/67  (!) 200/63   ?Pulse: 76 78  76  ?Resp: 14 15    ?Temp: 98.7 ?F (37.1 ?C) 98.7 ?F  (37.1 ?C)    ?TempSrc: Oral Oral    ?SpO2: 95%     ?Weight:      ?Height:      ? ?No intake or output data in the 24 hours ending 02/09/22 1431 ?Filed Weights  ? 02/05/22 0655 02/05/22 1107 02/08/22 0737  ?Weight: 46.6 kg 43 kg 47.9 kg  ? ? ?Examination: ? ?Eomi ncat ?Chest is clear no rales rhonchi ?S1 s2 no m/r/g rrr ?ROM is grossly intact ?Neurologic.  No focal deficit ?Dressing changed by Dr. Unk Lightning earlier today ? ? ?Data Reviewed: personally reviewed  ? ?CBC ?   ?Component Value Date/Time  ? WBC 9.8 02/09/2022 0422  ? RBC 4.25 02/09/2022 0422  ? HGB 10.7 (L) 02/09/2022 0422  ? HGB 12.4 12/13/2017 1416  ? HCT 35.9 (L) 02/09/2022 0422  ? HCT 37.8 12/13/2017 1416  ? PLT 374 02/09/2022 0422  ? PLT 254 12/13/2017 1416  ? MCV 84.5 02/09/2022 0422  ? MCV 83 12/13/2017 1416  ? MCH 25.2 (L) 02/09/2022 0422  ? MCHC 29.8 (L) 02/09/2022 0422  ? RDW 20.0 (H) 02/09/2022 0422  ? RDW 15.8 (H) 12/13/2017 1416  ? LYMPHSABS 2.0 02/08/2022 0428  ? LYMPHSABS 4.1 (H) 12/13/2017 1416  ? MONOABS 1.9 (H) 02/08/2022 0428  ? EOSABS 0.4 02/08/2022 0428  ? EOSABS 0.3 12/13/2017 1416  ? BASOSABS 0.1 02/08/2022 0428  ? BASOSABS 0.1 12/13/2017 1416  ? ? ?  Latest Ref Rng & Units 02/09/2022  ?  4:22 AM 02/08/2022  ?  4:28 AM 02/05/2022  ?  7:03 AM  ?CMP  ?Glucose 70 - 99 mg/dL 158   160   166    ?BUN 6 -  20 mg/dL 43   72   35    ?Creatinine 0.44 - 1.00 mg/dL 6.59   8.81   6.48    ?Sodium 135 - 145 mmol/L 132   132   136    ?Potassium 3.5 - 5.1 mmol/L 3.8   4.3   4.1    ?Chloride 98 - 111 mmol/L 94   97   102    ?CO2 22 - 32 mmol/L 24   21   25     ?Calcium 8.9 - 10.3 mg/dL 9.6   9.5   9.4    ? ? ? ?Radiology Studies: ?No results found. ? ? ?Scheduled Meds: ? amLODipine  10 mg Oral Daily  ? aspirin EC  81 mg Oral Daily  ? atorvastatin  80 mg Oral Daily  ? atropine  1 drop Both Eyes BID  ? buPROPion  150 mg Oral Daily  ? Chlorhexidine Gluconate Cloth  6 each Topical Q0600  ? dorzolamide-timolol  1 drop Both Eyes BID  ? furosemide  80 mg Oral BID   ? heparin  5,000 Units Subcutaneous Q8H  ? hydrALAZINE  150 mg Oral Q8H  ? insulin aspart  0-6 Units Subcutaneous TID WC  ? latanoprost  1 drop Both Eyes QHS  ? losartan  100 mg Oral Daily  ? metoCLOPramide  10 mg Oral TID AC  ? metoprolol tartrate  50 mg Oral BID  ? multivitamin  1 tablet Oral QHS  ? pregabalin  25 mg Oral QHS  ? sevelamer carbonate  2,400 mg Oral TID WC  ? ?Continuous Infusions: ? ? ? ? LOS: 5 days  ? ?Time spent: 52 ? ?Nita Sells, MD ?[facility] ?To contact the attending provider between 7A-7P or the covering provider during after hours 7P-7A, please log into the web site [email or web address] and access using universal [place] password for that web site. If you do not have the password, please call the hospital operator. ? ?02/09/2022, 2:31 PM  ? ? ?

## 2022-02-09 NOTE — Progress Notes (Signed)
Nephrology Follow-Up Consult note ? ? ?Assessment/Recommendations: Tammy Moses is a/an 56 y.o. female with a past medical history significant for ESRD on HD, PAD, DM2, HTN initially presented for outpatient elective wound debridement and angio however was found to have severe uncontrolled HTN.  ?  ?# ESRD:  ?-outpatient orders: [facility].  MWF.  H852DP.  3.5 hours.  350/600.  2K, 2.5 Cal.  LUE AVF.  Heparin 1500 units bolus, 500 units maintenance.  Venofer 50 mg q. treatment, EPO 11,800 units, Sensipar 30 q. Mondays ?-Continue MWF schedule; tolerated 3L net UF on 4/17 ? ?Next HD tomorrow if she's still here ?  ?# Volume/ severe hypertension: EDW 43.5kg. Attempt to achieve EDW as tolerated ?-Continue labetalol and hydral, losartan, Lasix.  Just increased  losartan to 100mg  daily. ?  ?# PAD s/p right AKA w/ nonhealing wound/gangrene, leukocytosis ?-recent rt AKA after unsuccessful revascularization (staples still in ); needs left LE angiogram once more stabilize from a BP standpoint. Should be this week ?-abx per primary service if indicated ?  ?# Anemia of Chronic Kidney Disease: Hemoglobin 9.9. Currently receives venofer and epo as above. Can resume meds if hgb <10 tomorrow ?  ?# Secondary Hyperparathyroidism/Hyperphosphatemia: resume home binders and sensipar  ?  ?# Vascular access: LUE AVF ? ?Possibly requiring placement at SNF ?  ?# Additional recommendations: ?- Dose all meds for creatinine clearance < 10 ml/min  ?- Unless absolutely necessary, no MRIs with gadolinium.  ?- Implement save arm precautions.  Prefer needle sticks in the dorsum of the hands or wrists.  No blood pressure measurements in left arm. ?- If blood transfusion is requested during hemodialysis sessions, please alert [place] prior to the session.  ?- If a hemodialysis catheter line culture is requested, please alert [place] as only hemodialysis nurses are able to collect those specimens.  ? ? ? ?Anakin Varkey W ?[facility] ?02/09/2022 ?1:25 PM ? ?___________________________________________________________ ? ?CC: ESRD ? ?Interval History/Subjective: Patient resting in bed being fed by a sitter with no complaints ? ?Medications:  ?Current Facility-Administered Medications  ?Medication Dose Route Frequency Provider Last Rate Last Admin  ? acetaminophen (TYLENOL) tablet 650 mg  650 mg Oral Q4H PRN Florencia Reasons, MD      ? albuterol (PROVENTIL) (2.5 MG/3ML) 0.083% nebulizer solution 3 mL  3 mL Inhalation Q4H PRN Florencia Reasons, MD      ? amLODipine (NORVASC) tablet 10 mg  10 mg Oral Daily Florencia Reasons, MD   10 mg at 02/09/22 8242  ? aspirin EC tablet 81 mg  81 mg Oral Daily Florencia Reasons, MD   81 mg at 02/09/22 0859  ? atorvastatin (LIPITOR) tablet 80 mg  80 mg Oral Daily Florencia Reasons, MD   80 mg at 02/09/22 3536  ? atropine 1 % ophthalmic solution 1 drop  1 drop Both Eyes BID Florencia Reasons, MD   1 drop at 02/09/22 0854  ? buPROPion (WELLBUTRIN SR) 12 hr tablet 150 mg  150 mg Oral Daily Florencia Reasons, MD   150 mg at 02/09/22 0853  ? Chlorhexidine Gluconate Cloth 2 % PADS 6 each  6 each Topical Q0600 Gean Quint, MD   6 each at 02/09/22 0520  ? dorzolamide-timolol (COSOPT) 22.3-6.8 MG/ML ophthalmic solution 1 drop  1 drop Both Eyes BID Florencia Reasons, MD   1 drop at 02/09/22 0854  ? furosemide (LASIX) tablet 80 mg  80 mg Oral BID Reesa Chew, MD   80 mg at 02/09/22 1443  ?  heparin injection 5,000 Units  5,000 Units Subcutaneous Q8H Florencia Reasons, MD   5,000 Units at 02/09/22 5329  ? hydrALAZINE (APRESOLINE) injection 10 mg  10 mg Intravenous Q8H PRN Florencia Reasons, MD   10 mg at 02/05/22 1119  ? hydrALAZINE (APRESOLINE) tablet 100 mg  100 mg Oral Q8H Florencia Reasons, MD   100 mg at 02/09/22 0850  ? HYDROmorphone (DILAUDID) injection 0.5 mg  0.5 mg Intravenous Q4H PRN Florencia Reasons, MD   0.5 mg at 02/05/22 0423  ? insulin aspart (novoLOG) injection 0-6 Units  0-6 Units Subcutaneous TID WC Paige, Victoria J, DO   1 Units at 02/09/22 1214  ? labetalol (NORMODYNE) injection 10 mg  10 mg  Intravenous Q2H PRN Etta Quill, DO   10 mg at 02/07/22 9242  ? labetalol (NORMODYNE) tablet 200 mg  200 mg Oral BID Florencia Reasons, MD   200 mg at 02/09/22 6834  ? latanoprost (XALATAN) 0.005 % ophthalmic solution 1 drop  1 drop Both Eyes QHS Florencia Reasons, MD   1 drop at 02/08/22 2102  ? losartan (COZAAR) tablet 100 mg  100 mg Oral Daily Reesa Chew, MD   100 mg at 02/09/22 1962  ? metoCLOPramide (REGLAN) tablet 10 mg  10 mg Oral TID Marcelle Overlie, MD   10 mg at 02/09/22 1213  ? multivitamin (RENA-VIT) tablet 1 tablet  1 tablet Oral Vonzell Schlatter, MD   1 tablet at 02/08/22 2101  ? oxyCODONE-acetaminophen (PERCOCET/ROXICET) 5-325 MG per tablet 1 tablet  1 tablet Oral Q6H PRN Florencia Reasons, MD   1 tablet at 02/09/22 0853  ? pregabalin (LYRICA) capsule 25 mg  25 mg Oral QHS Florencia Reasons, MD   25 mg at 02/08/22 2101  ? senna-docusate (Senokot-S) tablet 1 tablet  1 tablet Oral Daily PRN Florencia Reasons, MD      ? sevelamer carbonate (RENVELA) tablet 2,400 mg  2,400 mg Oral TID WC Florencia Reasons, MD   2,400 mg at 02/09/22 1213  ? sevelamer carbonate (RENVELA) tablet 800 mg  800 mg Oral PRN Spero Geralds, MD   800 mg at 02/05/22 1635  ?  ? ? ?Review of Systems: ?10 systems reviewed and negative except per interval history/subjective ? ?Physical Exam: ?Vitals:  ? 02/09/22 0850 02/09/22 0853  ?BP: (!) 200/63   ?Pulse:  76  ?Resp:    ?Temp:    ?SpO2:    ? ?No intake/output data recorded. ?No intake or output data in the 24 hours ending 02/09/22 1325 ? ?Constitutional: Chronically ill-appearing, no distress ?ENMT: ears and nose without scars or lesions, MMM ?CV: normal rate, no audible rub ?Respiratory: Bilateral chest rise, normal work of breathing ?Gastrointestinal: soft, nondistended ?Psych: alert, appropriate mood and affect ?Access: LUA access, no infiltration ? ? ?Test Results ?I personally reviewed new and old clinical labs and radiology tests ?Lab Results  ?Component Value Date  ? NA 132 (L) 02/09/2022  ? K 3.8 02/09/2022  ? CL 94 (L)  02/09/2022  ? CO2 24 02/09/2022  ? BUN 43 (H) 02/09/2022  ? CREATININE 6.59 (H) 02/09/2022  ? CALCIUM 9.6 02/09/2022  ? ALBUMIN 3.0 (L) 02/09/2022  ? PHOS 4.4 02/09/2022  ? ? ?CBC ?Recent Labs  ?Lab 02/05/22 ?0147 02/05/22 ?0703 02/08/22 ?2297 02/09/22 ?0422  ?WBC 14.6* 14.1* 10.8* 9.8  ?NEUTROABS 9.8*  --  6.5  --   ?HGB 9.6* 9.9* 10.4* 10.7*  ?HCT 32.9* 32.8* 34.2* 35.9*  ?MCV 86.8 86.5 83.8 84.5  ?  PLT 457* 454* 420* 374  ? ? ? ? ? ?

## 2022-02-10 ENCOUNTER — Encounter (HOSPITAL_COMMUNITY)
Admission: AD | Disposition: A | Discharge status: Home Health | Payer: Self-pay | Source: Home / Self Care | Attending: Family Medicine

## 2022-02-10 DIAGNOSIS — I998 Other disorder of circulatory system: Secondary | ICD-10-CM

## 2022-02-10 DIAGNOSIS — I160 Hypertensive urgency: Secondary | ICD-10-CM | POA: Diagnosis not present

## 2022-02-10 HISTORY — PX: ABDOMINAL AORTOGRAM W/LOWER EXTREMITY: CATH118223

## 2022-02-10 LAB — CBC
HCT: 37.5 % (ref 36.0–46.0)
Hemoglobin: 11.4 g/dL — ABNORMAL LOW (ref 12.0–15.0)
MCH: 25.7 pg — ABNORMAL LOW (ref 26.0–34.0)
MCHC: 30.4 g/dL (ref 30.0–36.0)
MCV: 84.7 fL (ref 80.0–100.0)
Platelets: 355 10*3/uL (ref 150–400)
RBC: 4.43 MIL/uL (ref 3.87–5.11)
RDW: 20 % — ABNORMAL HIGH (ref 11.5–15.5)
WBC: 10.3 10*3/uL (ref 4.0–10.5)
nRBC: 0 % (ref 0.0–0.2)

## 2022-02-10 LAB — RENAL FUNCTION PANEL
Albumin: 3 g/dL — ABNORMAL LOW (ref 3.5–5.0)
Anion gap: 15 (ref 5–15)
BUN: 66 mg/dL — ABNORMAL HIGH (ref 6–20)
CO2: 24 mmol/L (ref 22–32)
Calcium: 9.9 mg/dL (ref 8.9–10.3)
Chloride: 93 mmol/L — ABNORMAL LOW (ref 98–111)
Creatinine, Ser: 8.72 mg/dL — ABNORMAL HIGH (ref 0.44–1.00)
GFR, Estimated: 5 mL/min — ABNORMAL LOW (ref >60)
Glucose, Bld: 125 mg/dL — ABNORMAL HIGH (ref 70–99)
Phosphorus: 5.7 mg/dL — ABNORMAL HIGH (ref 2.5–4.6)
Potassium: 5.2 mmol/L — ABNORMAL HIGH (ref 3.5–5.1)
Sodium: 132 mmol/L — ABNORMAL LOW (ref 135–145)

## 2022-02-10 LAB — GLUCOSE, CAPILLARY
Glucose-Capillary: 130 mg/dL — ABNORMAL HIGH (ref 70–99)
Glucose-Capillary: 205 mg/dL — ABNORMAL HIGH (ref 70–99)
Glucose-Capillary: 161 mg/dL — ABNORMAL HIGH (ref 70–99)

## 2022-02-10 SURGERY — ABDOMINAL AORTOGRAM W/LOWER EXTREMITY
Anesthesia: LOCAL

## 2022-02-10 MED ORDER — ACETAMINOPHEN 325 MG PO TABS
650.0000 mg | ORAL_TABLET | ORAL | Status: DC | PRN
  Administered 2022-02-11: 650 mg via ORAL
  Filled 2022-02-10: qty 2

## 2022-02-10 MED ORDER — IODIXANOL 320 MG/ML IV SOLN
INTRAVENOUS | Status: DC | PRN
  Administered 2022-02-10: 57 mL

## 2022-02-10 MED ORDER — LIDOCAINE HCL (PF) 1 % IJ SOLN
INTRAMUSCULAR | Status: DC | PRN
  Administered 2022-02-10: 12 mL

## 2022-02-10 MED ORDER — SODIUM CHLORIDE 0.9% FLUSH
3.0000 mL | Freq: Two times a day (BID) | INTRAVENOUS | Status: DC
  Administered 2022-02-10 – 2022-02-11 (×2): 3 mL via INTRAVENOUS

## 2022-02-10 MED ORDER — LABETALOL HCL 5 MG/ML IV SOLN: 10.0000 mg | INTRAVENOUS | Status: DC | PRN

## 2022-02-10 MED ORDER — MIDAZOLAM HCL 2 MG/2ML IJ SOLN
INTRAMUSCULAR | Status: AC
  Filled 2022-02-10: qty 2

## 2022-02-10 MED ORDER — HEPARIN (PORCINE) IN NACL 1000-0.9 UT/500ML-% IV SOLN
INTRAVENOUS | Status: DC | PRN
  Administered 2022-02-10 (×2): 500 mL

## 2022-02-10 MED ORDER — HYDRALAZINE HCL 20 MG/ML IJ SOLN: 5.0000 mg | INTRAMUSCULAR | Status: DC | PRN

## 2022-02-10 MED ORDER — HEPARIN (PORCINE) IN NACL 1000-0.9 UT/500ML-% IV SOLN
INTRAVENOUS | Status: AC
  Filled 2022-02-10: qty 500

## 2022-02-10 MED ORDER — LIDOCAINE HCL (PF) 1 % IJ SOLN
INTRAMUSCULAR | Status: AC
  Filled 2022-02-10: qty 30

## 2022-02-10 MED ORDER — SODIUM CHLORIDE 0.9 % IV SOLN: 250.0000 mL | INTRAVENOUS | Status: DC | PRN

## 2022-02-10 MED ORDER — ONDANSETRON HCL 4 MG/2ML IJ SOLN: 4.0000 mg | Freq: Four times a day (QID) | INTRAMUSCULAR | Status: DC | PRN

## 2022-02-10 MED ORDER — FENTANYL CITRATE (PF) 100 MCG/2ML IJ SOLN
INTRAMUSCULAR | Status: AC
  Filled 2022-02-10: qty 2

## 2022-02-10 MED ORDER — SODIUM CHLORIDE 0.9% FLUSH: 3.0000 mL | INTRAVENOUS | Status: DC | PRN

## 2022-02-10 SURGICAL SUPPLY — 10 items
CATH OMNI FLUSH 5F 65CM (CATHETERS) ×1 IMPLANT
DEVICE CLOSURE MYNXGRIP 5F (Vascular Products) ×1 IMPLANT
KIT MICROPUNCTURE NIT STIFF (SHEATH) ×1 IMPLANT
KIT PV (KITS) ×2 IMPLANT
SHEATH PINNACLE 5F 10CM (SHEATH) ×1 IMPLANT
SHEATH PROBE COVER 6X72 (BAG) ×1 IMPLANT
SYR MEDRAD MARK V 150ML (SYRINGE) ×1 IMPLANT
TRANSDUCER W/STOPCOCK (MISCELLANEOUS) ×2 IMPLANT
TRAY PV CATH (CUSTOM PROCEDURE TRAY) ×2 IMPLANT
WIRE BENTSON .035X145CM (WIRE) ×1 IMPLANT

## 2022-02-10 NOTE — Assessment & Plan Note (Signed)
Glucose normal - Continue SS corrections 

## 2022-02-10 NOTE — Progress Notes (Signed)
Vascular progress note ? ?Charl is doing well status post diagnostic angiography ?Unfortunately Alexsa is peripheral arterial disease is severe, similar to the right leg which resulted in an above-knee amputation. ?We discussed surgical options including primary above-knee amputation versus femoral to below-knee popliteal artery bypass. ? ?Cayenne elected to proceed with femoral to below-knee popliteal artery bypass. ?This is scheduled for tomorrow. ?Please continue current medication regimen ?N.p.o. at midnight, basic labs ? ?Broadus John MD ? ?

## 2022-02-10 NOTE — Op Note (Signed)
? ? ?  Patient name: Tammy Moses MRN: [id number] DOB: Jul 25, 1966 Sex: female ? ?02/10/2022 ?Pre-operative Diagnosis: Left lower extremity [place] 5 critical limb ischemia ?Post-operative diagnosis:  Same ?Surgeon:  Broadus John, MD ?Procedure Performed: ?1.  Ultrasound-guided micropuncture access of the right common femoral artery ?2.  Aortogram ?3.  Second-order cannulation, left lower extremity angiogram ?4.  Device assisted closure-Mynx ?5.  Moderate sedation time 23 minutes ?6.  Contrast volume 57 mL ? ?Indications: [name] is a 56 year old female with history of peripheral arterial disease most recently undergoing multiple interventions on the right lower extremity which ended in above-knee amputation.  Unfortunately, [name] has developed tissue loss on her left foot.  She wants to pursue every modality possible to prevent amputation.  After discussing the risk and benefits of left lower extremity angiogram in an effort to define and improve distal perfusion, [name] elected to proceed. ? ?Findings:  ?Aortogram: Bilateral renal arteries occluded, small aortoiliac system. ?No flow-limiting stenosis in the aorta or bilateral iliac arteries. ? ?Left leg: Flow-limiting stenosis in the common femoral artery, large profunda, superficial femoral artery occluded with reconstitution of the below-knee popliteal artery.  Single-vessel peroneal runoff to the foot with significant disease throughout.  ?  ?Procedure:  The patient was identified in the holding area and taken to room 8.  The patient was then placed supine on the table and prepped and draped in the usual sterile fashion.  A time out was called.  Ultrasound was used to evaluate the right common femoral artery.  It was patent .  A digital ultrasound image was acquired.  A micropuncture needle was used to access the right common femoral artery under ultrasound guidance.  An 018 wire was advanced without resistance and a micropuncture sheath was placed.  The  018 wire was removed and a benson wire was placed.  The micropuncture sheath was exchanged for a 5 french sheath.  An omniflush catheter was advanced over the wire to the level of L-1.  An abdominal angiogram was obtained.  Next, using the omniflush catheter and a benson wire, the aortic bifurcation was crossed and the catheter was placed into the left external iliac artery and left runoff was obtained.   ? ?[name] has multilevel occlusive disease with single-vessel peroneal runoff to the foot.  I had a long conversation with her regarding the above.  She would benefit from left common femoral endarterectomy and femoral to below-knee popliteal artery bypass.  Patient was last vein mapped on 10/29/2021.  Veins appeared large enough for bypass surgery. ? ?After discussing the risk and benefits, family elected to pursue femoral neurectomy, femoral to below-knee popliteal artery bypass using greater saphenous vein. ? ? ?[name], MD ?[facility] ?Office: [phone] ? ? ? ?

## 2022-02-10 NOTE — Assessment & Plan Note (Addendum)
-   Consult nephrology for HD ?- Phosbinders and aranesp her Nephrology ?

## 2022-02-10 NOTE — Assessment & Plan Note (Signed)
See above

## 2022-02-10 NOTE — Assessment & Plan Note (Addendum)
Initial presenting complaint. ? ?BP is very very labile.  Of note, Dr. Virl Cagey and I reviewed her arterial line BP and cuff BP during her bypass on 4/20, and the two correlated fairly well at that time, typically within +/-5 mmHg systolic and diastolic.    ? ?Unfortunately, severe intra-dialytic hypotension during her last two sessions precludes titrating antihypertensives up ? ?- Continue amlodipine furosemide, metoprolol, hydralazine and losartan ?

## 2022-02-10 NOTE — Hospital Course (Addendum)
Tammy Moses is a 56 y.o. female with past medical history of hypertension, diabetes mellitus, end-stage renal disease on hemodialysis, status post above-knee amputation 2 months back presented to the hospital with complaints of severe hypertension during her preop work-up.  Of note patient had AKA on right  in February and was discharged to skilled nursing facility.  Patient did have a follow-up appointment with vascular surgery in April when she was noted to have new right gangrene of the left great toe.  Patient was then scheduled for debridement of the mild dehiscence of the right open amputation stump as well as left lower extremity angiogram.  On the day of surgery unfortunately blood pressure was 161 systolic so she was brought into the hospital. ? ?Sequence of events. ? 4/13: Sent from preop for admission due to severe HTN >> admitted to ICU on Cleviprex ?4/14: BP self corrected rapidly, put back on oral agents, transferred to floor ?4/17: Vascular surgery consulted, unclear delay ?4/19: Angiography of left leg completed, bypass offered ?4/20: LEFT fem-pop bypass by Dr. Virl Cagey ?4/21-23: Given hygiene concerns, family concerns -> SNF placement sought, of note, patient with waxing and waning disorientation ?4/24: Transfused 1 unit PRBCs ?4/25: Palliative care consulted in setting of persistent delirium, concern for failure to thrive ?    ?Assessment and Plan: ?Principal Problem: ?  Critical limb ischemia of left lower extremity with autologous bypass graft with gangrene ([place]) ?Active Problems: ?  Hypertensive urgency ?  Delirium ?  Blindness ?  Hypertension ?  Controlled type 2 diabetes mellitus with chronic kidney disease on chronic dialysis ([place]) ?  Anemia in chronic kidney disease (CKD) ?  ESRD (end stage renal disease) ([place]) -  Dialyzes at [facility].  On Monday Wednesday Friday. ?  Peripheral arterial disease ([place]) ?  S/P AKA (above knee amputation) unilateral, right ([place]) ?  Malnutrition of  moderate degree ?  Hyponatremia ?  ?* Critical limb ischemia of left lower extremity with gangrene  ?Peripheral arterial disease ?S/p fem-pop bypass 4/20 by Dr. Virl Cagey ?At this time patient is awaiting for skilled nursing facility placement.  Will need to follow-up with Dr. Unk Lightning vascular surgery after discharge.  Continue aspirin Plavix and Lipitor. ?  ?Hypertensive urgency/Essential hypertension ?She does have hypotensive episodes during dialysis so has been difficult to titrate antihypertensives at this time.  Nephrology aware.  Currently on amlodipine, Lasix, metoprolol, hydralazine and losartan. ?  ? Delirium ?Has had intermittent episodes of confusion several weeks prior to this presentation.  Palliative ?care has been consulted due to malnutrition, persistent delirium with hypotension during dialysis and possibly ongoing slow decline. ?  ?Malnutrition of moderate degree ?As evidenced by moderate loss of subcutaneous fat and severe loss of subcutaneous muscle mass in the setting of end-stage renal disease ?  ?S/P AKA (above knee amputation) unilateral, right ([place]) ?With wound dehiscence.  At this time the left above-knee amputation stump has improved with antibiotics and healing well   ?  ?ESRD (end stage renal disease) ([place]) -  Dialyzes at [facility].  On Monday Wednesday Friday. ?Nephrology on board for hemodialysis needs. ? ?Anemia of chronic kidney disease (CKD) ?Baseline Hgb 9-11.  Latest hemoglobin of 9.0.  Received 1 unit of packed RBC during hospitalization. Aranesp and Iron per nephrology ?  ?Controlled type 2 diabetes mellitus with chronic kidney disease on chronic dialysis  ?Continue sliding scale insulin.  We will continue to monitor. ?  ? Blindness.  Supportive care ? ?Ethics/goals of care.  Patient appears to have delirium and failure to thrive with ongoing vascular issues on dialysis.  Overall prognosis seems to be guarded/poor.  Palliative care has been consulted for ongoing discussion.        ?

## 2022-02-10 NOTE — Progress Notes (Signed)
?  Progress Note ? ? ?Patient: Tammy Moses ZJQ:[id number] DOB: [birth year] DOA: 02/04/2022     6 ?DOS: the patient was seen and examined on 02/10/2022 at 13:40. ?  ? ? ? ?Brief hospital course: ?Mrs. Meadow is a 56 y.o. F with ESRD on HD, HTN, DM, PVD s/p R AKA 2 months ago, who presented with nonhealing right AKA wound, found to have severe range hypertension requiring admission to ICU. ? ? ? ? ? ? ?Assessment and Plan: ?* Hypertensive urgency ?BP labile ?-Continue amlodipine, fursoemide, hydralazine, metoprolol, losartan ? ? ?Peripheral arterial disease ([place]) ?-Continue aspirin and atorvastatin ?- Consult Vascular surgery, plan for left femoral bypass tomorrow ? ?ESRD (end stage renal disease) ([place]) -  Dialyzes at [facility].  On Monday Wednesday Friday. ?- Consult nephrology for HD ? ?Controlled type 2 diabetes mellitus with chronic kidney disease on chronic dialysis ([place]) ?Glucose normal ?-Continue SS corrections ? ?Hypertension ?See above ? ?Blind right eye ?  ? ? ? ? ? ? ? ? ? ?Subjective: She has pain in her left leg.  She has no fever, chest pain, dyspnea. ? ? ? ? ?Physical Exam: ?Vitals:  ? 02/10/22 1322 02/10/22 1327 02/10/22 1332 02/10/22 1500  ?BP: (!) 113/55 (!) 118/50 102/74 97/61  ?Pulse: 71 71 71   ?Resp: 14 11 19    ?Temp:      ?TempSrc:      ?SpO2: 98% 97% 95%   ?Weight:      ?Height:      ? ?Elderly adult female, changes to the eyes of glaucoma, mostly edentulous ?RRR, no murmurs, no peripheral edema ?Lungs are clear without rales or wheezes, respiratory effort normal ?Abdomen soft at times with patient regarding ?Right AKA ?Left leg with diminished subcutaneous muscle mass and fat, diminished pulses, not cold ?Attention normal, affect blunted, judgment insight appear normal ?Face symmetric, speech fluent ? ?Data Reviewed: ?Nephrology and vascular surgery notes reviewed ?Labs notable for potassium 5.2, creatinine and BUN at baseline ?CBC normal ? ?Family Communication:   ? ? ? ?Disposition: ?Status  is: Inpatient ? ? ? ? ? ? ? ? ?Author: ?Edwin Dada, MD ?02/10/2022 6:12 PM ? ?For on call review [email or web address].  ? ? ?

## 2022-02-10 NOTE — Progress Notes (Signed)
Pt was found on the floor by NT assigned naked. When asked she stated " I fell " . All side rails up  on side where pt fell. Family called Arma Heading at [place] PM. BP taken at 123/59, HR 70, Temp 98.2 F, 97 % room Air. Dr. Loleta Books notified via secure chat. No noted injury  from fall.  ? 02/10/22 1840  ?What Happened  ?Was fall witnessed? No  ?Was patient injured? No  ?Patient found on floor  ?Found by Staff-comment  ?Stated prior activity other (comment) ?(bedbound)  ?Follow Up  ?MD notified Dr,. Danford  ?Time MD notified 1844  ?Family notified Yes - comment  ?Time family notified 1856  ?Additional tests No  ?Progress note created (see row info) Yes  ?Adult Fall Risk Assessment  ?Risk Factor Category (scoring not indicated) High fall risk per protocol (document High fall risk)  ?Age 56  ?Fall History: Fall within 6 months prior to admission 0  ?Elimination; Bowel and/or Urine Incontinence 2  ?Elimination; Bowel and/or Urine Urgency/Frequency 0  ?Medications: includes PCA/Opiates, Anti-convulsants, Anti-hypertensives, Diuretics, Hypnotics, Laxatives, Sedatives, and Psychotropics 5  ?Patient Care Equipment 1  ?Mobility-Assistance 2  ?Mobility-Gait 2  ?Mobility-Sensory Deficit 2  ?Altered awareness of immediate physical environment 0  ?Impulsiveness 0  ?Lack of understanding of one's physical/cognitive limitations 4  ?Total Score 18  ?Patient Fall Risk Level High fall risk  ?Adult Fall Risk Interventions  ?Required Bundle Interventions *See Row Information* High fall risk - low, moderate, and high requirements implemented  ?Additional Interventions HeadStart bed sensor (education/return demonstration);Use of appropriate toileting equipment (bedpan, BSC, etc.)  ?Screening for Fall Injury Risk (To be completed on HIGH fall risk patients) - Assessing Need for Floor Mats  ?Risk For Fall Injury- Criteria for Floor Mats None identified - No additional interventions needed  ?Vitals  ?Temp 98.2 ?F (36.8 ?C)  ?Temp Source  Oral  ?BP (!) 123/59  ?Pulse Rate 70  ? ? ?

## 2022-02-10 NOTE — Progress Notes (Signed)
Nephrology Follow-Up Consult note ? ? ?Assessment/Recommendations: Tammy Moses is a/an 56 y.o. female with a past medical history significant for ESRD on HD, PAD, DM2, HTN initially presented for outpatient elective wound debridement and angio however was found to have severe uncontrolled HTN.  ?  ?# ESRD:  ?-outpatient orders: [facility].  MWF.  [id number].  3.5 hours.  350/600.  2K, 2.5 Cal.  LUE AVF.  Heparin 1500 units bolus, 500 units maintenance.  Venofer 50 mg q. treatment, EPO 11,800 units, Sensipar 30 q. Mondays ?-Continue MWF schedule; tolerated 3L net UF on 4/17 ?Seen on HD  ?4K bath 101/51 3L net UF goal ?No complaints; no changes ? ?BP much better this am. ?  ?# Volume/ severe hypertension: EDW 43.5kg. Attempt to achieve EDW as tolerated ?-Continue labetalol and hydral, losartan, Lasix.  Just increased  losartan to 100mg  daily. ?  ?# PAD s/p right AKA w/ nonhealing wound/gangrene, leukocytosis ?-recent rt AKA after unsuccessful revascularization (staples still in ); needs left LE angiogram once more stabilize from a BP standpoint. Should be this week ?-abx per primary service if indicated ?  ?# Anemia of Chronic Kidney Disease: Hemoglobin 11.4. Currently receives venofer, hold epo.  ?  ?# Secondary Hyperparathyroidism/Hyperphosphatemia: resume home binders and sensipar p5.7 4/19 on sevelamer 3 tabs TIDM ?  ?# Vascular access: LUE AVF ? ?Possibly requiring placement at SNF ?  ?# Additional recommendations: ?- Dose all meds for creatinine clearance < 10 ml/min  ?- Unless absolutely necessary, no MRIs with gadolinium.  ?- Implement save arm precautions.  Prefer needle sticks in the dorsum of the hands or wrists.  No blood pressure measurements in left arm. ?- If blood transfusion is requested during hemodialysis sessions, please alert [place] prior to the session.  ?- If a hemodialysis catheter line culture is requested, please alert [place] as only hemodialysis nurses are able to collect those specimens.   ? ? ? ?Dajae Kizer W ?[facility] ?02/10/2022 ?9:16 AM ? ?___________________________________________________________ ? ?CC: ESRD ? ?Interval History/Subjective: Patient resting in bed main complaint is sacral pain. Appetite improving, denies f/c/n/v/dyspnea ? ?Medications:  ?Current Facility-Administered Medications  ?Medication Dose Route Frequency Provider Last Rate Last Admin  ? acetaminophen (TYLENOL) tablet 650 mg  650 mg Oral Q4H PRN Florencia Reasons, MD      ? albuterol (PROVENTIL) (2.5 MG/3ML) 0.083% nebulizer solution 3 mL  3 mL Inhalation Q4H PRN Florencia Reasons, MD      ? amLODipine (NORVASC) tablet 10 mg  10 mg Oral Daily Florencia Reasons, MD   10 mg at 02/09/22 5625  ? aspirin EC tablet 81 mg  81 mg Oral Daily Florencia Reasons, MD   81 mg at 02/09/22 0859  ? atorvastatin (LIPITOR) tablet 80 mg  80 mg Oral Daily Florencia Reasons, MD   80 mg at 02/09/22 6389  ? atropine 1 % ophthalmic solution 1 drop  1 drop Both Eyes BID Florencia Reasons, MD   1 drop at 02/09/22 2152  ? buPROPion (WELLBUTRIN SR) 12 hr tablet 150 mg  150 mg Oral Daily Florencia Reasons, MD   150 mg at 02/09/22 0853  ? Chlorhexidine Gluconate Cloth 2 % PADS 6 each  6 each Topical Q0600 Gean Quint, MD   6 each at 02/10/22 [phone]  ? dorzolamide-timolol (COSOPT) 22.3-6.8 MG/ML ophthalmic solution 1 drop  1 drop Both Eyes BID Florencia Reasons, MD   1 drop at 02/09/22 2152  ? furosemide (LASIX) tablet 80 mg  80 mg Oral  BID Reesa Chew, MD   80 mg at 02/09/22 1704  ? heparin injection 5,000 Units  5,000 Units Subcutaneous Blair Promise, MD   5,000 Units at 02/10/22 2458  ? hydrALAZINE (APRESOLINE) injection 10 mg  10 mg Intravenous Q8H PRN Florencia Reasons, MD   10 mg at 02/05/22 1119  ? hydrALAZINE (APRESOLINE) tablet 150 mg  150 mg Oral Q8H Nita Sells, MD   150 mg at 02/10/22 0998  ? HYDROmorphone (DILAUDID) injection 0.5 mg  0.5 mg Intravenous Q4H PRN Florencia Reasons, MD   0.5 mg at 02/05/22 0423  ? insulin aspart (novoLOG) injection 0-6 Units  0-6 Units Subcutaneous TID WC Shary Key, DO   1 Units at 02/09/22 1214  ? labetalol (NORMODYNE) injection 10 mg  10 mg Intravenous Q2H PRN Etta Quill, DO   10 mg at 02/07/22 3382  ? latanoprost (XALATAN) 0.005 % ophthalmic solution 1 drop  1 drop Both Eyes QHS Florencia Reasons, MD   1 drop at 02/09/22 2152  ? losartan (COZAAR) tablet 100 mg  100 mg Oral Daily Reesa Chew, MD   100 mg at 02/09/22 5053  ? metoCLOPramide (REGLAN) tablet 10 mg  10 mg Oral TID Marcelle Overlie, MD   10 mg at 02/09/22 1659  ? metoprolol tartrate (LOPRESSOR) tablet 50 mg  50 mg Oral BID Nita Sells, MD   50 mg at 02/09/22 2149  ? multivitamin (RENA-VIT) tablet 1 tablet  1 tablet Oral Vonzell Schlatter, MD   1 tablet at 02/09/22 2149  ? oxyCODONE-acetaminophen (PERCOCET/ROXICET) 5-325 MG per tablet 1 tablet  1 tablet Oral Q6H PRN Florencia Reasons, MD   1 tablet at 02/10/22 [phone]  ? pregabalin (LYRICA) capsule 25 mg  25 mg Oral QHS Florencia Reasons, MD   25 mg at 02/09/22 2149  ? senna-docusate (Senokot-S) tablet 1 tablet  1 tablet Oral Daily PRN Florencia Reasons, MD      ? sevelamer carbonate (RENVELA) tablet 2,400 mg  2,400 mg Oral TID WC Florencia Reasons, MD   2,400 mg at 02/09/22 1659  ? sevelamer carbonate (RENVELA) tablet 800 mg  800 mg Oral PRN Spero Geralds, MD   800 mg at 02/05/22 1635  ?  ? ? ?Review of Systems: ?10 systems reviewed and negative except per interval history/subjective ? ?Physical Exam: ?Vitals:  ? 02/10/22 0830 02/10/22 0900  ?BP: (!) 101/50 (!) 101/51  ?Pulse: 70 72  ?Resp:    ?Temp:    ?SpO2:    ? ?No intake/output data recorded. ?No intake or output data in the 24 hours ending 02/10/22 0916 ? ?Constitutional: Chronically ill-appearing, no distress ?ENMT: ears and nose without scars or lesions, MMM ?CV: normal rate, no audible rub ?Respiratory: Bilateral chest rise, normal work of breathing ?Gastrointestinal: soft, nondistended ?Psych: alert, appropriate mood and affect ?Access: LUA access, no infiltration ? ? ?Test Results ?I personally reviewed new and old clinical labs and  radiology tests ?Lab Results  ?Component Value Date  ? NA 132 (L) 02/10/2022  ? K 5.2 (H) 02/10/2022  ? CL 93 (L) 02/10/2022  ? CO2 24 02/10/2022  ? BUN 66 (H) 02/10/2022  ? CREATININE 8.72 (H) 02/10/2022  ? CALCIUM 9.9 02/10/2022  ? ALBUMIN 3.0 (L) 02/10/2022  ? PHOS 5.7 (H) 02/10/2022  ? ? ?CBC ?Recent Labs  ?Lab 02/05/22 ?3419 02/05/22 ?0703 02/08/22 ?3790 02/09/22 ?0422 02/10/22 ?2409  ?WBC 14.6*   < > 10.8* 9.8 10.3  ?NEUTROABS 9.8*  --  6.5  --   --   ?HGB 9.6*   < > 10.4* 10.7* 11.4*  ?HCT 32.9*   < > 34.2* 35.9* 37.5  ?MCV 86.8   < > 83.8 84.5 84.7  ?PLT 457*   < > 420* 374 355  ? < > = values in this interval not displayed.  ? ? ? ? ? ?

## 2022-02-10 NOTE — Progress Notes (Signed)
?  Progress Note ? ? ? ?02/10/2022 ?1:03 PM ?Day of Surgery ? ?Subjective:  No complaints ? ? ?Vitals:  ? 02/10/22 1155 02/10/22 1250  ?BP:    ?Pulse: 80   ?Resp:    ?Temp:    ?SpO2:  94%  ? ?Physical Exam ?Lungs:  non labored ?Incisions:  R AKA with some areas slow to heal ?Extremities:  L GT tip dry gangrene ?Neurologic: A&O ? ?CBC ?   ?Component Value Date/Time  ? WBC 10.3 02/10/2022 0640  ? RBC 4.43 02/10/2022 0640  ? HGB 11.4 (L) 02/10/2022 0640  ? HGB 12.4 12/13/2017 1416  ? HCT 37.5 02/10/2022 0640  ? HCT 37.8 12/13/2017 1416  ? PLT 355 02/10/2022 0640  ? PLT 254 12/13/2017 1416  ? MCV 84.7 02/10/2022 0640  ? MCV 83 12/13/2017 1416  ? MCH 25.7 (L) 02/10/2022 0640  ? MCHC 30.4 02/10/2022 0640  ? RDW 20.0 (H) 02/10/2022 0640  ? RDW 15.8 (H) 12/13/2017 1416  ? LYMPHSABS 2.0 02/08/2022 0428  ? LYMPHSABS 4.1 (H) 12/13/2017 1416  ? MONOABS 1.9 (H) 02/08/2022 0428  ? EOSABS 0.4 02/08/2022 0428  ? EOSABS 0.3 12/13/2017 1416  ? BASOSABS 0.1 02/08/2022 0428  ? BASOSABS 0.1 12/13/2017 1416  ? ? ?BMET ?   ?Component Value Date/Time  ? NA 132 (L) 02/10/2022 0640  ? NA 146 (H) 05/10/2017 0950  ? K 5.2 (H) 02/10/2022 0640  ? CL 93 (L) 02/10/2022 0640  ? CO2 24 02/10/2022 0640  ? GLUCOSE 125 (H) 02/10/2022 0640  ? BUN 66 (H) 02/10/2022 0640  ? BUN 38 (H) 05/10/2017 0950  ? CREATININE 8.72 (H) 02/10/2022 0640  ? CREATININE 3.40 (H) 03/11/2017 1505  ? CALCIUM 9.9 02/10/2022 0640  ? GFRNONAA 5 (L) 02/10/2022 0640  ? GFRNONAA 15 (L) 03/11/2017 1505  ? GFRAA 5 (L) 05/05/2020 1314  ? GFRAA 17 (L) 03/11/2017 1505  ? ? ?INR ?   ?Component Value Date/Time  ? INR 1.2 10/21/2021 1425  ? ? ? ?Intake/Output Summary (Last 24 hours) at 02/10/2022 1303 ?Last data filed at 02/10/2022 1110 ?Gross per 24 hour  ?Intake --  ?Output 2300 ml  ?Net -2300 ml  ? ? ? ? ?Assessment/Plan:  56 y.o. female with slow to heal R AKA; L GT gangrene Day of Surgery  ? ?LLE angiogram today for rutherford 5 CLI ?After discussing the risks and benefits, Tammy Moses  elected to proceed.  ? ?[name], MD ?[facility] ?Office Phone Number: [phone] ?02/10/2022 1:03 PM ? ? ? ? ? ?

## 2022-02-10 NOTE — Assessment & Plan Note (Addendum)
-   Continue aspirin and atorvastatin and Plavix ?

## 2022-02-11 ENCOUNTER — Inpatient Hospital Stay (HOSPITAL_COMMUNITY): Payer: Medicare Other | Admitting: Anesthesiology

## 2022-02-11 ENCOUNTER — Encounter (HOSPITAL_COMMUNITY)
Admission: AD | Disposition: A | Discharge status: Home Health | Payer: Self-pay | Source: Home / Self Care | Attending: Family Medicine

## 2022-02-11 ENCOUNTER — Encounter (HOSPITAL_COMMUNITY): Payer: Self-pay | Admitting: Vascular Surgery

## 2022-02-11 ENCOUNTER — Other Ambulatory Visit: Payer: Self-pay

## 2022-02-11 DIAGNOSIS — I998 Other disorder of circulatory system: Secondary | ICD-10-CM | POA: Diagnosis not present

## 2022-02-11 DIAGNOSIS — I70222 Atherosclerosis of native arteries of extremities with rest pain, left leg: Secondary | ICD-10-CM

## 2022-02-11 DIAGNOSIS — E1122 Type 2 diabetes mellitus with diabetic chronic kidney disease: Secondary | ICD-10-CM | POA: Diagnosis not present

## 2022-02-11 DIAGNOSIS — H543 Unqualified visual loss, both eyes: Secondary | ICD-10-CM | POA: Diagnosis not present

## 2022-02-11 DIAGNOSIS — Z992 Dependence on renal dialysis: Secondary | ICD-10-CM

## 2022-02-11 DIAGNOSIS — N186 End stage renal disease: Secondary | ICD-10-CM

## 2022-02-11 DIAGNOSIS — I160 Hypertensive urgency: Secondary | ICD-10-CM | POA: Diagnosis not present

## 2022-02-11 DIAGNOSIS — R410 Disorientation, unspecified: Secondary | ICD-10-CM

## 2022-02-11 DIAGNOSIS — I120 Hypertensive chronic kidney disease with stage 5 chronic kidney disease or end stage renal disease: Secondary | ICD-10-CM

## 2022-02-11 HISTORY — PX: ENDARTERECTOMY FEMORAL: SHX5804

## 2022-02-11 HISTORY — PX: FEMORAL-POPLITEAL BYPASS GRAFT: SHX937

## 2022-02-11 HISTORY — PX: PATCH ANGIOPLASTY: SHX6230

## 2022-02-11 LAB — CBC
HCT: 37.1 % (ref 36.0–46.0)
Hemoglobin: 11.3 g/dL — ABNORMAL LOW (ref 12.0–15.0)
MCH: 25.7 pg — ABNORMAL LOW (ref 26.0–34.0)
MCHC: 30.5 g/dL (ref 30.0–36.0)
MCV: 84.5 fL (ref 80.0–100.0)
Platelets: 331 10*3/uL (ref 150–400)
RBC: 4.39 MIL/uL (ref 3.87–5.11)
RDW: 20.2 % — ABNORMAL HIGH (ref 11.5–15.5)
WBC: 11.3 10*3/uL — ABNORMAL HIGH (ref 4.0–10.5)
nRBC: 0 % (ref 0.0–0.2)

## 2022-02-11 LAB — GLUCOSE, CAPILLARY
Glucose-Capillary: 120 mg/dL — ABNORMAL HIGH (ref 70–99)
Glucose-Capillary: 139 mg/dL — ABNORMAL HIGH (ref 70–99)
Glucose-Capillary: 133 mg/dL — ABNORMAL HIGH (ref 70–99)
Glucose-Capillary: 121 mg/dL — ABNORMAL HIGH (ref 70–99)
Glucose-Capillary: 177 mg/dL — ABNORMAL HIGH (ref 70–99)
Glucose-Capillary: 192 mg/dL — ABNORMAL HIGH (ref 70–99)

## 2022-02-11 LAB — BASIC METABOLIC PANEL
Anion gap: 15 (ref 5–15)
BUN: 45 mg/dL — ABNORMAL HIGH (ref 6–20)
CO2: 22 mmol/L (ref 22–32)
Calcium: 9.5 mg/dL (ref 8.9–10.3)
Chloride: 92 mmol/L — ABNORMAL LOW (ref 98–111)
Creatinine, Ser: 6.2 mg/dL — ABNORMAL HIGH (ref 0.44–1.00)
GFR, Estimated: 7 mL/min — ABNORMAL LOW (ref >60)
Glucose, Bld: 147 mg/dL — ABNORMAL HIGH (ref 70–99)
Potassium: 4 mmol/L (ref 3.5–5.1)
Sodium: 129 mmol/L — ABNORMAL LOW (ref 135–145)

## 2022-02-11 LAB — TYPE AND SCREEN
ABO/RH(D): B POS
Antibody Screen: NEGATIVE

## 2022-02-11 LAB — LIPID PANEL
Cholesterol: 148 mg/dL (ref 0–200)
HDL: 61 mg/dL (ref >40)
LDL Cholesterol: 69 mg/dL (ref 0–99)
Total CHOL/HDL Ratio: 2.4 RATIO
Triglycerides: 89 mg/dL (ref <150)
VLDL: 18 mg/dL (ref 0–40)

## 2022-02-11 LAB — POCT ACTIVATED CLOTTING TIME
Activated Clotting Time: 209 seconds
Activated Clotting Time: 245 seconds
Activated Clotting Time: 239 seconds

## 2022-02-11 SURGERY — BYPASS GRAFT FEMORAL-POPLITEAL ARTERY
Anesthesia: General | Site: Leg Upper | Laterality: Left

## 2022-02-11 MED ORDER — HEPARIN SODIUM (PORCINE) 1000 UNIT/ML IJ SOLN
INTRAMUSCULAR | Status: DC | PRN
  Administered 2022-02-11 (×2): 2000 [IU] via INTRAVENOUS
  Administered 2022-02-11: 5000 [IU] via INTRAVENOUS
  Administered 2022-02-11: 2000 [IU] via INTRAVENOUS

## 2022-02-11 MED ORDER — PROTAMINE SULFATE 10 MG/ML IV SOLN
INTRAVENOUS | Status: DC | PRN
  Administered 2022-02-11: 50 mg via INTRAVENOUS

## 2022-02-11 MED ORDER — ACETAMINOPHEN 325 MG PO TABS
325.0000 mg | ORAL_TABLET | ORAL | Status: DC | PRN
  Administered 2022-02-13: 650 mg via ORAL
  Administered 2022-02-14: 325 mg via ORAL
  Administered 2022-02-16: 650 mg via ORAL
  Filled 2022-02-11: qty 1
  Filled 2022-02-11 (×2): qty 2

## 2022-02-11 MED ORDER — ROCURONIUM BROMIDE 10 MG/ML (PF) SYRINGE
PREFILLED_SYRINGE | INTRAVENOUS | Status: AC
  Filled 2022-02-11: qty 20

## 2022-02-11 MED ORDER — SUGAMMADEX SODIUM 200 MG/2ML IV SOLN
INTRAVENOUS | Status: DC | PRN
  Administered 2022-02-11: 128 mg via INTRAVENOUS

## 2022-02-11 MED ORDER — HEPARIN SODIUM (PORCINE) 5000 UNIT/ML IJ SOLN
5000.0000 [IU] | Freq: Three times a day (TID) | INTRAMUSCULAR | Status: DC
  Administered 2022-02-12 – 2022-02-19 (×22): 5000 [IU] via SUBCUTANEOUS
  Filled 2022-02-11 (×22): qty 1

## 2022-02-11 MED ORDER — FENTANYL CITRATE (PF) 250 MCG/5ML IJ SOLN
INTRAMUSCULAR | Status: AC
  Filled 2022-02-11: qty 5

## 2022-02-11 MED ORDER — CEFAZOLIN SODIUM-DEXTROSE 2-3 GM-%(50ML) IV SOLR
INTRAVENOUS | Status: DC | PRN
  Administered 2022-02-11: 2 g via INTRAVENOUS

## 2022-02-11 MED ORDER — CEFAZOLIN SODIUM 1 G IJ SOLR
INTRAMUSCULAR | Status: AC
  Filled 2022-02-11: qty 20

## 2022-02-11 MED ORDER — FENTANYL CITRATE (PF) 250 MCG/5ML IJ SOLN
INTRAMUSCULAR | Status: DC | PRN
  Administered 2022-02-11 (×5): 50 ug via INTRAVENOUS

## 2022-02-11 MED ORDER — SODIUM CHLORIDE 0.9 % IV SOLN: 500.0000 mL | Freq: Once | INTRAVENOUS | Status: DC | PRN

## 2022-02-11 MED ORDER — DEXAMETHASONE SODIUM PHOSPHATE 10 MG/ML IJ SOLN
INTRAMUSCULAR | Status: AC
  Filled 2022-02-11: qty 1

## 2022-02-11 MED ORDER — 0.9 % SODIUM CHLORIDE (POUR BTL) OPTIME
TOPICAL | Status: DC | PRN
  Administered 2022-02-11 (×2): 1000 mL

## 2022-02-11 MED ORDER — EPHEDRINE SULFATE-NACL 50-0.9 MG/10ML-% IV SOSY
PREFILLED_SYRINGE | INTRAVENOUS | Status: DC | PRN
Start: 2022-02-11 — End: 2022-02-11
  Administered 2022-02-11: 10 mg via INTRAVENOUS

## 2022-02-11 MED ORDER — ONDANSETRON HCL 4 MG/2ML IJ SOLN
INTRAMUSCULAR | Status: DC | PRN
  Administered 2022-02-11: 4 mg via INTRAVENOUS

## 2022-02-11 MED ORDER — GUAIFENESIN-DM 100-10 MG/5ML PO SYRP: 15.0000 mL | ORAL_SOLUTION | ORAL | Status: DC | PRN

## 2022-02-11 MED ORDER — SUCCINYLCHOLINE CHLORIDE 200 MG/10ML IV SOSY
PREFILLED_SYRINGE | INTRAVENOUS | Status: AC
  Filled 2022-02-11: qty 10

## 2022-02-11 MED ORDER — ROCURONIUM BROMIDE 10 MG/ML (PF) SYRINGE
PREFILLED_SYRINGE | INTRAVENOUS | Status: AC
  Filled 2022-02-11: qty 10

## 2022-02-11 MED ORDER — LIDOCAINE 2% (20 MG/ML) 5 ML SYRINGE
INTRAMUSCULAR | Status: AC
  Filled 2022-02-11: qty 5

## 2022-02-11 MED ORDER — FUROSEMIDE 40 MG PO TABS
80.0000 mg | ORAL_TABLET | Freq: Two times a day (BID) | ORAL | Status: DC
Start: 2022-02-11 — End: 2022-02-11

## 2022-02-11 MED ORDER — HEPARIN SODIUM (PORCINE) 1000 UNIT/ML IJ SOLN
INTRAMUSCULAR | Status: AC
  Filled 2022-02-11: qty 10

## 2022-02-11 MED ORDER — MIDAZOLAM HCL 2 MG/2ML IJ SOLN
2.0000 mg | Freq: Once | INTRAMUSCULAR | Status: AC
  Administered 2022-02-11: 1 mg via INTRAVENOUS

## 2022-02-11 MED ORDER — ORAL CARE MOUTH RINSE: 15.0000 mL | Freq: Once | OROMUCOSAL | Status: AC

## 2022-02-11 MED ORDER — OXYCODONE HCL 5 MG/5ML PO SOLN: 5.0000 mg | Freq: Once | ORAL | Status: DC | PRN

## 2022-02-11 MED ORDER — ONDANSETRON HCL 4 MG/2ML IJ SOLN
INTRAMUSCULAR | Status: AC
  Filled 2022-02-11: qty 2

## 2022-02-11 MED ORDER — ONDANSETRON HCL 4 MG/2ML IJ SOLN: 4.0000 mg | Freq: Four times a day (QID) | INTRAMUSCULAR | Status: DC | PRN

## 2022-02-11 MED ORDER — ROCURONIUM BROMIDE 10 MG/ML (PF) SYRINGE
PREFILLED_SYRINGE | INTRAVENOUS | Status: DC | PRN
  Administered 2022-02-11: 30 mg via INTRAVENOUS
  Administered 2022-02-11: 20 mg via INTRAVENOUS
  Administered 2022-02-11: 70 mg via INTRAVENOUS
  Administered 2022-02-11: 50 mg via INTRAVENOUS

## 2022-02-11 MED ORDER — DEXAMETHASONE SODIUM PHOSPHATE 10 MG/ML IJ SOLN
INTRAMUSCULAR | Status: DC | PRN
Start: 2022-02-11 — End: 2022-02-11
  Administered 2022-02-11: 5 mg via INTRAVENOUS

## 2022-02-11 MED ORDER — ACETAMINOPHEN 650 MG RE SUPP: 325.0000 mg | RECTAL | Status: DC | PRN

## 2022-02-11 MED ORDER — CHLORHEXIDINE GLUCONATE 0.12 % MT SOLN: 15.0000 mL | Freq: Once | OROMUCOSAL | Status: AC

## 2022-02-11 MED ORDER — OXYCODONE HCL 5 MG PO TABS: 5.0000 mg | ORAL_TABLET | Freq: Once | ORAL | Status: DC | PRN

## 2022-02-11 MED ORDER — OXYCODONE-ACETAMINOPHEN 5-325 MG PO TABS
1.0000 | ORAL_TABLET | ORAL | Status: DC | PRN
  Administered 2022-02-12 – 2022-02-13 (×5): 2 via ORAL
  Administered 2022-02-13: 1 via ORAL
  Administered 2022-02-14 – 2022-02-19 (×8): 2 via ORAL
  Filled 2022-02-11 (×4): qty 2
  Filled 2022-02-11: qty 1
  Filled 2022-02-11 (×9): qty 2

## 2022-02-11 MED ORDER — MIDAZOLAM HCL 2 MG/2ML IJ SOLN
INTRAMUSCULAR | Status: AC
  Filled 2022-02-11: qty 2

## 2022-02-11 MED ORDER — SODIUM CHLORIDE 0.9 % IV SOLN: INTRAVENOUS | Status: DC | PRN

## 2022-02-11 MED ORDER — HEPARIN 6000 UNIT IRRIGATION SOLUTION
Status: AC
  Filled 2022-02-11: qty 500

## 2022-02-11 MED ORDER — PROTAMINE SULFATE 10 MG/ML IV SOLN
INTRAVENOUS | Status: AC
  Filled 2022-02-11: qty 5

## 2022-02-11 MED ORDER — CLOPIDOGREL BISULFATE 75 MG PO TABS
75.0000 mg | ORAL_TABLET | Freq: Every day | ORAL | Status: DC
  Administered 2022-02-12 – 2022-02-19 (×8): 75 mg via ORAL
  Filled 2022-02-11 (×8): qty 1

## 2022-02-11 MED ORDER — SODIUM CHLORIDE 0.9 % IV SOLN: INTRAVENOUS | Status: DC

## 2022-02-11 MED ORDER — ACETAMINOPHEN 500 MG PO TABS: 1000.0000 mg | ORAL_TABLET | Freq: Once | ORAL | Status: DC | PRN

## 2022-02-11 MED ORDER — FENTANYL CITRATE (PF) 100 MCG/2ML IJ SOLN: 25.0000 ug | INTRAMUSCULAR | Status: DC | PRN

## 2022-02-11 MED ORDER — PHENYLEPHRINE HCL-NACL 20-0.9 MG/250ML-% IV SOLN
INTRAVENOUS | Status: DC | PRN
Start: 2022-02-11 — End: 2022-02-11
  Administered 2022-02-11: 20 ug/min via INTRAVENOUS

## 2022-02-11 MED ORDER — ACETAMINOPHEN 10 MG/ML IV SOLN: 1000.0000 mg | Freq: Once | INTRAVENOUS | Status: DC | PRN

## 2022-02-11 MED ORDER — ACETAMINOPHEN 160 MG/5ML PO SOLN: 1000.0000 mg | Freq: Once | ORAL | Status: DC | PRN

## 2022-02-11 MED ORDER — PROPOFOL 10 MG/ML IV BOLUS
INTRAVENOUS | Status: AC
  Filled 2022-02-11: qty 20

## 2022-02-11 MED ORDER — PHENOL 1.4 % MT LIQD: 1.0000 | OROMUCOSAL | Status: DC | PRN

## 2022-02-11 MED ORDER — CHLORHEXIDINE GLUCONATE 0.12 % MT SOLN
OROMUCOSAL | Status: AC
Start: 2022-02-11 — End: 2022-02-11
  Administered 2022-02-11: 15 mL via OROMUCOSAL
  Filled 2022-02-11: qty 15

## 2022-02-11 MED ORDER — PANTOPRAZOLE SODIUM 40 MG PO TBEC
40.0000 mg | DELAYED_RELEASE_TABLET | Freq: Every day | ORAL | Status: DC
  Administered 2022-02-11 – 2022-02-19 (×9): 40 mg via ORAL
  Filled 2022-02-11 (×9): qty 1

## 2022-02-11 MED ORDER — SURGIFLO WITH THROMBIN (HEMOSTATIC MATRIX KIT) OPTIME
TOPICAL | Status: DC | PRN
  Administered 2022-02-11: 1 via TOPICAL

## 2022-02-11 MED ORDER — LIDOCAINE 2% (20 MG/ML) 5 ML SYRINGE
INTRAMUSCULAR | Status: DC | PRN
  Administered 2022-02-11: 60 mg via INTRAVENOUS

## 2022-02-11 MED ORDER — METOPROLOL TARTRATE 5 MG/5ML IV SOLN: 2.0000 mg | INTRAVENOUS | Status: DC | PRN

## 2022-02-11 MED ORDER — INSULIN ASPART 100 UNIT/ML IJ SOLN: 0.0000 [IU] | INTRAMUSCULAR | Status: DC | PRN

## 2022-02-11 MED ORDER — EPHEDRINE 5 MG/ML INJ
INTRAVENOUS | Status: AC
  Filled 2022-02-11: qty 5

## 2022-02-11 MED ORDER — PROPOFOL 10 MG/ML IV BOLUS
INTRAVENOUS | Status: DC | PRN
  Administered 2022-02-11: 100 mg via INTRAVENOUS

## 2022-02-11 MED ORDER — FUROSEMIDE 40 MG PO TABS
80.0000 mg | ORAL_TABLET | ORAL | Status: DC
  Administered 2022-02-13 – 2022-02-18 (×8): 80 mg via ORAL
  Filled 2022-02-11 (×8): qty 2

## 2022-02-11 MED ORDER — DEXMEDETOMIDINE (PRECEDEX) IN NS 20 MCG/5ML (4 MCG/ML) IV SYRINGE
PREFILLED_SYRINGE | INTRAVENOUS | Status: AC
  Filled 2022-02-11: qty 5

## 2022-02-11 MED ORDER — HEMOSTATIC AGENTS (NO CHARGE) OPTIME
TOPICAL | Status: DC | PRN
  Administered 2022-02-11: 1 via TOPICAL

## 2022-02-11 MED ORDER — HEPARIN 6000 UNIT IRRIGATION SOLUTION
Status: DC | PRN
  Administered 2022-02-11: 1

## 2022-02-11 MED ORDER — CEFAZOLIN SODIUM-DEXTROSE 2-4 GM/100ML-% IV SOLN: 2.0000 g | Freq: Three times a day (TID) | INTRAVENOUS | Status: DC

## 2022-02-11 MED ORDER — MIDAZOLAM HCL 2 MG/2ML IJ SOLN
INTRAMUSCULAR | Status: AC
  Administered 2022-02-11: 1 mg
  Filled 2022-02-11: qty 2

## 2022-02-11 SURGICAL SUPPLY — 58 items
ADH SKN CLS APL DERMABOND .7 (GAUZE/BANDAGES/DRESSINGS) ×2
AGENT HMST KT MTR STRL THRMB (HEMOSTASIS) ×2
BAG COUNTER SPONGE SURGICOUNT (BAG) ×3 IMPLANT
BAG SPNG CNTER NS LX DISP (BAG) ×2
BANDAGE ESMARK 6X9 LF (GAUZE/BANDAGES/DRESSINGS) IMPLANT
BLADE CLIPPER SURG (BLADE) ×3 IMPLANT
BNDG CMPR 9X6 STRL LF SNTH (GAUZE/BANDAGES/DRESSINGS) ×2
BNDG ELASTIC 6X5.8 VLCR STR LF (GAUZE/BANDAGES/DRESSINGS) ×1 IMPLANT
BNDG ESMARK 6X9 LF (GAUZE/BANDAGES/DRESSINGS) ×3
CANISTER SUCT 3000ML PPV (MISCELLANEOUS) ×3 IMPLANT
CLIP FOGARTY SPRING 6M (CLIP) ×3 IMPLANT
CLIP LIGATING EXTRA MED SLVR (CLIP) ×3 IMPLANT
CLIP LIGATING EXTRA SM BLUE (MISCELLANEOUS) ×6 IMPLANT
COVER PROBE W GEL 5X96 (DRAPES) ×4 IMPLANT
CUFF TOURN SGL QUICK 18X4 (TOURNIQUET CUFF) ×1 IMPLANT
DERMABOND ADVANCED (GAUZE/BANDAGES/DRESSINGS) ×1
DERMABOND ADVANCED .7 DNX12 (GAUZE/BANDAGES/DRESSINGS) ×2 IMPLANT
DRAPE INCISE IOBAN 66X45 STRL (DRAPES) ×1 IMPLANT
ELECT BLADE 4.0 EZ CLEAN MEGAD (MISCELLANEOUS) ×3
ELECT REM PT RETURN 9FT ADLT (ELECTROSURGICAL) ×6
ELECTRODE BLDE 4.0 EZ CLN MEGD (MISCELLANEOUS) IMPLANT
ELECTRODE REM PT RTRN 9FT ADLT (ELECTROSURGICAL) ×2 IMPLANT
GAUZE 4X4 16PLY ~~LOC~~+RFID DBL (SPONGE) ×1 IMPLANT
GLOVE BIO SURGEON STRL SZ7.5 (GLOVE) ×4 IMPLANT
GLOVE BIO SURGEON STRL SZ8 (GLOVE) ×1 IMPLANT
GLOVE BIOGEL PI IND STRL 8 (GLOVE) ×2 IMPLANT
GLOVE BIOGEL PI INDICATOR 8 (GLOVE) ×1
GLOVE SRG 8 PF TXTR STRL LF DI (GLOVE) ×2 IMPLANT
GLOVE SURG UNDER POLY LF SZ8 (GLOVE) ×3
GOWN STRL REUS W/ TWL LRG LVL3 (GOWN DISPOSABLE) ×4 IMPLANT
GOWN STRL REUS W/TWL 2XL LVL3 (GOWN DISPOSABLE) ×3 IMPLANT
GOWN STRL REUS W/TWL LRG LVL3 (GOWN DISPOSABLE) ×6
HEMOSTAT SNOW SURGICEL 2X4 (HEMOSTASIS) ×1 IMPLANT
KIT BASIN OR (CUSTOM PROCEDURE TRAY) ×3 IMPLANT
KIT TURNOVER KIT B (KITS) ×3 IMPLANT
NS IRRIG 1000ML POUR BTL (IV SOLUTION) ×6 IMPLANT
PACK PERIPHERAL VASCULAR (CUSTOM PROCEDURE TRAY) ×3 IMPLANT
PAD ARMBOARD 7.5X6 YLW CONV (MISCELLANEOUS) ×6 IMPLANT
PADDING CAST ABS 6INX4YD NS (CAST SUPPLIES) ×1
PADDING CAST ABS COTTON 6X4 NS (CAST SUPPLIES) IMPLANT
PATCH VASC XENOSURE 1CMX6CM (Vascular Products) ×3 IMPLANT
PATCH VASC XENOSURE 1X6 (Vascular Products) IMPLANT
PENCIL BUTTON HOLSTER BLD 10FT (ELECTRODE) ×2 IMPLANT
SPONGE T-LAP 18X18 ~~LOC~~+RFID (SPONGE) ×2 IMPLANT
SURGIFLO W/THROMBIN 8M KIT (HEMOSTASIS) ×1 IMPLANT
SUT MNCRL AB 4-0 PS2 18 (SUTURE) ×11 IMPLANT
SUT PROLENE 5 0 C 1 24 (SUTURE) ×3 IMPLANT
SUT PROLENE 6 0 BV (SUTURE) ×8 IMPLANT
SUT SILK 2 0 SH CR/8 (SUTURE) ×1 IMPLANT
SUT SILK 3 0 (SUTURE) ×3
SUT SILK 3-0 18XBRD TIE 12 (SUTURE) IMPLANT
SUT VIC AB 2-0 CT1 27 (SUTURE) ×6
SUT VIC AB 2-0 CT1 TAPERPNT 27 (SUTURE) ×4 IMPLANT
SUT VIC AB 3-0 SH 27 (SUTURE) ×15
SUT VIC AB 3-0 SH 27X BRD (SUTURE) ×6 IMPLANT
TOWEL GREEN STERILE (TOWEL DISPOSABLE) ×3 IMPLANT
UNDERPAD 30X36 HEAVY ABSORB (UNDERPADS AND DIAPERS) ×3 IMPLANT
WATER STERILE IRR 1000ML POUR (IV SOLUTION) ×3 IMPLANT

## 2022-02-11 NOTE — Care Management Important Message (Signed)
Important Message ? ?Patient Details  ?Name: Tammy Moses ?MRN: [id number] ?Date of Birth: 01-25-1966 ? ? ?Medicare Important Message Given:  Yes ? ? ? ? ?[name] ?02/11/2022, 9:29 AM ?

## 2022-02-11 NOTE — Anesthesia Procedure Notes (Signed)
Arterial Line Insertion ?Start/End4/20/2023 11:58 AM, 02/11/2022 12:15 PM ?Performed by: Lowella Dell, CRNA, CRNA ? Patient location: Pre-op. ?Preanesthetic checklist: patient identified, IV checked, site marked, risks and benefits discussed, surgical consent, monitors and equipment checked, pre-op evaluation, timeout performed and anesthesia consent ?Lidocaine 1% used for infiltration ?Right, radial was placed ?Catheter size: 20 G ?Hand hygiene performed  and maximum sterile barriers used  ? ?Attempts: 2 ?Procedure performed without using ultrasound guided technique. ?Following insertion, dressing applied and Biopatch. ?Post procedure assessment: normal and unchanged ? ? ? ?

## 2022-02-11 NOTE — Anesthesia Procedure Notes (Addendum)
Procedure Name: Intubation ?Date/Time: 02/11/2022 2:00 PM ?Performed by: Erick Colace, CRNA ?Pre-anesthesia Checklist: Patient identified, Emergency Drugs available, Suction available and Patient being monitored ?Patient Re-evaluated:Patient Re-evaluated prior to induction ?Oxygen Delivery Method: Circle system utilized ?Preoxygenation: Pre-oxygenation with 100% oxygen ?Induction Type: IV induction ?Ventilation: Mask ventilation without difficulty ?Grade View: Grade I ?Tube type: Oral ?Tube size: 7.0 mm ?Number of attempts: 1 ?Airway Equipment and Method: Stylet and Oral airway ?Placement Confirmation: ETT inserted through vocal cords under direct vision, positive ETCO2 and breath sounds checked- equal and bilateral ?Secured at: 22 cm ?Tube secured with: Tape ?Dental Injury: Teeth and Oropharynx as per pre-operative assessment  ?Comments: Bottom front tooth on the right noted to be loosed on induction, prior to intubation and DL.  ? ? ? ? ?

## 2022-02-11 NOTE — Progress Notes (Signed)
?  Progress Note ? ? ? ?02/11/2022 ?1:41 PM ?Day of Surgery ? ?Subjective:  No complaints ? ? ?Vitals:  ? 02/11/22 1125 02/11/22 1140  ?BP: (!) 141/61 (!) 151/67  ?Pulse: 60 61  ?Resp: 11 17  ?Temp:  98.3 ?F (36.8 ?C)  ?SpO2:  98%  ? ?Physical Exam ?Lungs:  non labored ?Incisions:  R AKA with some areas slow to heal ?Extremities:  L GT tip dry gangrene ?Neurologic: A&O ? ?CBC ?   ?Component Value Date/Time  ? WBC 11.3 (H) 02/11/2022 0329  ? RBC 4.39 02/11/2022 0329  ? HGB 11.3 (L) 02/11/2022 0329  ? HGB 12.4 12/13/2017 1416  ? HCT 37.1 02/11/2022 0329  ? HCT 37.8 12/13/2017 1416  ? PLT 331 02/11/2022 0329  ? PLT 254 12/13/2017 1416  ? MCV 84.5 02/11/2022 0329  ? MCV 83 12/13/2017 1416  ? MCH 25.7 (L) 02/11/2022 0329  ? MCHC 30.5 02/11/2022 0329  ? RDW 20.2 (H) 02/11/2022 0329  ? RDW 15.8 (H) 12/13/2017 1416  ? LYMPHSABS 2.0 02/08/2022 0428  ? LYMPHSABS 4.1 (H) 12/13/2017 1416  ? MONOABS 1.9 (H) 02/08/2022 0428  ? EOSABS 0.4 02/08/2022 0428  ? EOSABS 0.3 12/13/2017 1416  ? BASOSABS 0.1 02/08/2022 0428  ? BASOSABS 0.1 12/13/2017 1416  ? ? ?BMET ?   ?Component Value Date/Time  ? NA 129 (L) 02/11/2022 0329  ? NA 146 (H) 05/10/2017 0950  ? K 4.0 02/11/2022 0329  ? CL 92 (L) 02/11/2022 0329  ? CO2 22 02/11/2022 0329  ? GLUCOSE 147 (H) 02/11/2022 0329  ? BUN 45 (H) 02/11/2022 0329  ? BUN 38 (H) 05/10/2017 0950  ? CREATININE 6.20 (H) 02/11/2022 0329  ? CREATININE 3.40 (H) 03/11/2017 1505  ? CALCIUM 9.5 02/11/2022 0329  ? GFRNONAA 7 (L) 02/11/2022 0329  ? GFRNONAA 15 (L) 03/11/2017 1505  ? GFRAA 5 (L) 05/05/2020 1314  ? GFRAA 17 (L) 03/11/2017 1505  ? ? ?INR ?   ?Component Value Date/Time  ? INR 1.2 10/21/2021 1425  ? ? ? ?Intake/Output Summary (Last 24 hours) at 02/11/2022 1341 ?Last data filed at 02/11/2022 0941 ?Gross per 24 hour  ?Intake 123 ml  ?Output --  ?Net 123 ml  ? ? ? ? ?Assessment/Plan:  56 y.o. female with slow to heal R AKA waveforms, and therefore she underwent left lower extremity angiography in an effort to  define and possibly improve perfusion to the lower extremity.  Unfortunately, Tammy Moses has severe disease, not amenable to endovascular therapy.  I had a long discussion with her regarding bypass surgery in an effort to salvage the left leg.  Should she not pursue bypass, the only option would be above-knee amputation.  After discussing risk and benefits of left lower extremity femoral to popliteal artery bypass surgery, Britnay elected to proceed. ? ?[name], MD ?[facility] ?Office Phone Number: [phone] ?02/11/2022 1:41 PM ? ? ? ? ? ?

## 2022-02-11 NOTE — Anesthesia Preprocedure Evaluation (Addendum)
Anesthesia Evaluation  ?Patient identified by MRN, date of birth, ID band ? ?Reviewed: ?Allergy & Precautions, Patient's Chart, lab work & pertinent test results, Unable to perform ROS - Chart review only ? ?History of Anesthesia Complications ?Negative for: history of anesthetic complications ? ?Airway ?Mallampati: III ? ?TM Distance: >3 FB ?Neck ROM: Full ? ? ? Dental ? ?(+) Missing, Poor Dentition, Chipped, Dental Advisory Given ?  ?Pulmonary ?asthma , COPD, former smoker,  ?  ?breath sounds clear to auscultation ? ? ? ? ? ? Cardiovascular ?hypertension, Pt. on medications and Pt. on home beta blockers ?+ Peripheral Vascular Disease  ?+ Valvular Problems/Murmurs AS and MR  ?Rhythm:Regular  ?Echo 10/2021 ??1. Left ventricular ejection fraction, by estimation, is 60 to 65%. The left ventricle has normal function. The left ventricle has no regional wall motion abnormalities. The left ventricular internal cavity size was mildly dilated. There is mild left ventricular hypertrophy. Left ventricular diastolic parameters are consistent with Grade II diastolic dysfunction (pseudonormalization). Elevated left atrial pressure.  ??2. Right ventricular systolic function is normal. The right ventricular size is normal. There is normal pulmonary artery systolic pressure. The estimated right ventricular systolic pressure is 28.3TDVV.  ??3. Right atrial size was mildly dilated.  ??4. Left atrial size was mildly dilated.  ??5. The mitral valve is degenerative. Mild mitral valve regurgitation. Moderate mitral stenosis. The mean mitral valve gradient is 6.0 mmHg at 73bpm. MVA 1.4cm^2 by continuity equation. Moderate mitral annular calcification.  ??6. Tricuspid valve regurgitation is mild to moderate.  ??7. The aortic valve is tricuspid. There is moderate calcification of the aortic valve. Aortic valve regurgitation is not visualized. Mild aortic valve stenosis.  ??8. The inferior vena cava is normal  in size with greater than 50% respiratory variability, suggesting right atrial pressure of 3 mmHg.  ?  ?Neuro/Psych ?PSYCHIATRIC DISORDERS Anxiety Depression  Neuromuscular disease   ? GI/Hepatic ?PUD, GERD  ,(+) Hepatitis -, C  ?Endo/Other  ?diabetes ? Renal/GU ?ESRF and DialysisRenal disease  ? ?  ?Musculoskeletal ?negative musculoskeletal ROS ?(+)  ? Abdominal ?  ?Peds ? Hematology ? ?(+) Blood dyscrasia, anemia ,   ?Anesthesia Other Findings ? ? Reproductive/Obstetrics ? ?  ? ? ? ? ? ? ? ? ? ? ? ? ? ?  ?  ? ? ? ? ? ? ? ?Anesthesia Physical ?Anesthesia Plan ? ?ASA: 3 ? ?Anesthesia Plan: General  ? ?Post-op Pain Management:   ? ?Induction: Intravenous ? ?PONV Risk Score and Plan: 3 and Ondansetron and Dexamethasone ? ?Airway Management Planned: Oral ETT ? ?Additional Equipment: Arterial line ? ?Intra-op Plan:  ? ?Post-operative Plan: Extubation in OR ? ?Informed Consent: I have reviewed the patients History and Physical, chart, labs and discussed the procedure including the risks, benefits and alternatives for the proposed anesthesia with the patient or authorized representative who has indicated his/her understanding and acceptance.  ? ? ? ?Dental advisory given ? ?Plan Discussed with: CRNA ? ?Anesthesia Plan Comments:   ? ? ? ? ? ? ?Anesthesia Quick Evaluation ? ?

## 2022-02-11 NOTE — Progress Notes (Signed)
Pt arrived to unit from PACU, pt blind , A/O to self only,  CCMD called ,CHG given,Will continue to monitor.  ? ?Phoebe Sharps, RN ? ? ? 02/11/22 2019  ?Vitals  ?Temp 97.6 ?F (36.4 ?C)  ?Temp Source Axillary  ?BP 114/74  ?Pulse Rate 62  ?ECG Heart Rate 62  ?Resp 11  ?Level of Consciousness  ?Level of Consciousness Alert  ?Oxygen Therapy  ?SpO2 100 %  ?O2 Device Room Air  ?O2 Flow Rate (L/min) 0 L/min  ?Patient Activity (if Appropriate) In bed  ?Pulse Oximetry Type Continuous  ?Art Line  ?Arterial Line BP 141/47  ?Arterial Line MAP (mmHg) 78 mmHg  ?Arterial Line Location Right radial  ?ECG Monitoring  ?Cardiac Rhythm NSR  ?Pain Assessment  ?Pain Scale 0-10  ?Pain Score 0  ?MEWS Score  ?MEWS Temp 0  ?MEWS Systolic 0  ?MEWS Pulse 0  ?MEWS RR 1  ?MEWS LOC 0  ?MEWS Score 1  ?MEWS Score Color Green  ? ? ?

## 2022-02-11 NOTE — Assessment & Plan Note (Addendum)
Patient's family reported concerns of intermittent confusion at home for several weeks prior to admission. ? ?Here, she has demonstrated the same.  This seems to wax and wane.  At times she is able to be re-oriented, at times not.  It is exacerbated by blindness and immobility.   ? ?Given her malnutrition, persistent delirium (reported by family prior to admission) and end stage renal disease complicated by intra-dialytic hypotension, I am concerned that she may be starting a slow decline and may not be able to tolerate dialysis much longer. ? ?This is far from clear at this time, but I recommend palliative begin discussions with her niece/HCPOA about what goals of care would look like.  ?- Consult Palliative care ?

## 2022-02-11 NOTE — Op Note (Signed)
? ? ?NAME: Tammy Moses    MRN: [id number] ?DOB: [birth year]    DATE OF OPERATION: 02/11/2022 ? ?PREOP DIAGNOSIS:   ? 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 ?POSTOP DIAGNOSIS:   ? ?Same ? ?PROCEDURE:  ?  ?Left iliofemoral endarterectomy ?Left femoral to below-knee popliteal artery bypass using ipsilateral nonreversed greater saphenous vein ? ?SURGEON: Broadus John ? ?ASSIST: Fortunato Curling, MD ? ?ANESTHESIA: General ? ?EBL: 375ml ? ?INDICATIONS:  ? ? Tammy Moses is a 56 y.o. female with history of severe peripheral arterial disease.  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  Family currently has Rutherford 5 critical limb ischemia in the left leg.  Recent angiogram demonstrated vascular disease not amenable to endovascular therapy.  After discussing the risk and benefits of femoral endarterectomy, left femoropopliteal bypass, Tammy Moses elected to proceed. ? ?FINDINGS:  ? ?Severe femoral disease, severe popliteal artery disease ? ? ?TECHNIQUE:  ? ?Patient was brought to the OR and laid in supine position.  General anesthesia was induced and the patient was prepped and draped in standard fashion. ? ?An ultrasound was brought onto the field and left saphenous vein was insonated to ensure adequate size.  It proved sizable.   ? ?The case began with standard below-knee popliteal artery exposure this revealed a severely diseased popliteal artery.  Next, the common femoral artery was exposed through an oblique incision.  This also had severe calcific disease.  There was a clamp of a portion in the distal external iliac artery.  Left leg saphenectomy followed.  The saphenous vein was placed in vein solution.  Next, my attention turned to left-sided endarterectomy.  The patient was heparinized to an ACT greater than 250.  The distal external iliac artery, profunda, superficial femoral artery were controlled with use of Vesseloops and clamps, and the common femoral artery was opened through  a longitudinal arteriotomy.  This required an extensive iliofemoral endarterectomy.  At completion, there was excellent backbleeding from the profunda.  Antegrade flow was good.  A 6 cm bovine pericardial patch was brought to the field and sewn in standard fashion using running 5-0 Prolene suture. ? ?Next, my attention turned to femoral to below-knee popliteal artery bypass.  Longitudinal arteriotomy was made on the previous on patch.  The proximal aspect of the saphenous vein was beveled in standard fashion and sewn in using running 6-0 Prolene suture.  Next, the vein was pressurized and hand-held valvulotome used to lyse the valves.  This demonstrated excellent, pulsatile flow.  Special care was made to ensure there was no kinking or twisting of the bypass, with anterior surface saphenous vein mark.  A tunneler was brought onto the field and the saphenous vein bypass was tunneled in anatomic fashion. ? ?Next, the below-knee popliteal artery was controlled with longitudinal arteriotomy made.  There was significant disease, however patent flow lumen.  The saphenous vein was cut to length and sewn in end-to-side fashion to the below-knee popliteal artery. ? ?On completion, there was a excellent, multiphasic peroneal signal in the foot, which augmented with bypass occlusion.  I was very happy with this result. ? ?Heparin was reversed, and hemostasis achieved with use of thrombin product and cautery.  Incisions were closed in layers of 2-0 Vicryl suture with Monocryl and Dermabond skin ? ? ?Given the complexity of the case,  the assistant was necessary in order to expedient the procedure and safely perform the technical aspects of the operation.  The assistant provided  traction and countertraction to assist with exposure of the artery proximally and distally.  They assisted with suture ligature of multiple branches.  Their assistance was critical in the performance of both the proximal and distal anastomosis.These  skills, especially following the Prolene suture for the anastomosis, could not have been adequately performed by a scrub tech assistant.  ? ? ?Tammy Burows, MD ?[facility] ? ?DATE OF DICTATION:   02/11/2022 ? ?

## 2022-02-11 NOTE — Progress Notes (Signed)
?  Progress Note ? ? ?Patient: Tammy Moses FVC:[id number] DOB: Jan 11, 1966 DOA: 02/04/2022     7 ?DOS: the patient was seen and examined on 02/11/2022 at 9:05AM ?  ? ? ? ?Brief hospital course: ?[name] is a 56 y.o. F with ESRD on HD, HTN, DM, PVD s/p R AKA 2 months ago, who presented with nonhealing right AKA wound, found to have severe range hypertension requiring admission to ICU. ? ? ? ? ? ? ?Assessment and Plan: ?* Hypertensive urgency ?BP controlled ?-Continue amlodipine, fursoemide, hydralazine, metoprolol, losartan ? ? ?Delirium ?Confused overnight last night, states she was just trying to "get attention" but I suspect this is some delirium. ?Delirium precautions:  ? -Lights and TV off, minimize interruptions at night ? -Blinds open and lights on during day ? -Glasses/hearing aid with patient ? -Frequent reorientation ? -PT/OT when able ? -Avoid sedation medications/Beers list medications ?  ? ? ?Peripheral arterial disease ([place]) ?- Continue aspirin and atorvastatin ?- Consult Vascular surgery ?- To OR Today for fem-pop bypass on left  ? ?ESRD (end stage renal disease) ([place]) -  Dialyzes at [facility].  On Monday Wednesday Friday. ?- Consult nephrology for HD ? ?Controlled type 2 diabetes mellitus with chronic kidney disease on chronic dialysis ([place]) ?Glucose normal ?-Continue SS corrections ? ?Hypertension ?See above ? ?Blindness ?  ? ? ? ? ? ? ? ? ? ?Subjective: Agitated overnight, rolled out of bed and fell on the floor, did not hit her head, has no headache, chest pain, shoulder pain, back pain, arm pain today.  No dyspnea, no swelling, no confusion, no fever ? ? ? ? ?Physical Exam: ?Vitals:  ? 02/10/22 2100 02/11/22 0338 02/11/22 1125 02/11/22 1140  ?BP: (!) 171/43 (!) 147/66 (!) 141/61 (!) 151/67  ?Pulse: 69 68 60 61  ?Resp: 13 13 11 17   ?Temp: 98.6 ?F (37 ?C) 98.7 ?F (37.1 ?C)  98.3 ?F (36.8 ?C)  ?TempSrc: Oral Oral  Oral  ?SpO2: 97% 95%  98%  ?Weight:    64 kg  ?Height:    5\' 1"  (1.549 m)  ? ?Thin  adult female, mostly edentulous, blind, lying in bed, interactive ?RRR, no murmurs, no peripheral edema ?Respiratory rate normal, lungs clear without rales or wheezes ?Abdomen soft without tenderness palpation or guarding ?Musculoskeletal exam is notable for severe loss of subcutaneous muscle mass and fat diffusely, the right AKA stump is clean and dry and intact without drainage or dehiscence ?Attention seems normal, affect seems appropriate, judgment seems mildly impaired but at baseline, she is oriented to person, place, time, and situation, she has generalized weakness but symmetric strength ? ?Data Reviewed: ?Vascular surgery notes reviewed, op note from yesterday reviewed, nursing notes reviewed, vital signs reviewed ?Patient metabolic panel notable for mild decrease in sodium to 129 ?Lipid panel notable for LDL 69 ?Glucose normal ?Hemogram notable for white blood cell count 11, no change from previous ? ?Family Communication:   ? ? ? ?Disposition: ?Status is: Inpatient ? ? ? ? ? ? ? ? ?Author: ?Edwin Dada, MD ?02/11/2022 3:43 PM ? ?For on call review [email or web address].  ? ? ?

## 2022-02-11 NOTE — Progress Notes (Signed)
PHARMACY NOTE:  ANTIMICROBIAL RENAL DOSAGE ADJUSTMENT ? ?Current antimicrobial regimen includes a mismatch between antimicrobial dosage and estimated renal function.  As per policy approved by the Pharmacy & Therapeutics and Medical Executive Committees, the antimicrobial dosage will be adjusted accordingly. ? ?Current antimicrobial dosage:  Cefazolin 2g q8 x2 ? ?Indication: post op abx ? ?Renal Function: ? ?Estimated Creatinine Clearance: 8.6 mL/min (A) (by C-G formula based on SCr of 6.2 mg/dL (H)). ?[]      On intermittent HD, scheduled: ?[]      On CRRT ?   ?Antimicrobial dosage has been changed to:  No further need for post op abx since preop dose will cover for 24 hrs due to renal function ? ?Additional comments: ? ?Onnie Boer, PharmD, BCIDP, AAHIVP, CPP ?Infectious Disease Pharmacist ?02/11/2022 8:42 PM ? ? ? ? ?  ? ?

## 2022-02-11 NOTE — Progress Notes (Signed)
Nephrology Follow-Up Consult note ? ? ?Assessment/Recommendations: Tammy Moses is a/an 56 y.o. female with a past medical history significant for ESRD on HD, PAD, DM2, HTN initially presented for outpatient elective wound debridement and angio however was found to have severe uncontrolled HTN.  ?  ?# ESRD:  ?-outpatient orders: [facility].  MWF.  O160VP.  3.5 hours.  350/600.  2K, 2.5 Cal.  LUE AVF.  Heparin 1500 units bolus, 500 units maintenance.  Venofer 50 mg q. treatment, EPO 11,800 units, Sensipar 30 q. Mondays. Down to 43-43.8 kg here in the hospital. ?-Continue MWF schedule; tolerated 3L net UF on 4/17, 2.3 4/19 ?  ?# Volume/ severe hypertension: EDW 43.5kg. Attempt to achieve EDW as tolerated ?-Continue labetalol and hydral, losartan, Lasix.  Increased  losartan to 100mg  daily. ?  ?# PAD s/p right AKA w/ nonhealing wound/gangrene, leukocytosis ?-recent rt AKA after unsuccessful revascularization (staples still in )l  ?- per [facility] going for left fem pop bypass today. ?-abx per primary service if indicated ?  ?# Anemia of Chronic Kidney Disease: Hemoglobin 11.4. Currently receives venofer, hold epo.  ?  ?# Secondary Hyperparathyroidism/Hyperphosphatemia: resumed home binders and sensipar p5.7 4/19 on sevelamer 3 tabs TIDM ?  ?# Vascular access: LUE AVF ? ?Possibly requiring placement at SNF ?  ?# Additional recommendations: ?- Dose all meds for creatinine clearance < 10 ml/min  ?- Unless absolutely necessary, no MRIs with gadolinium.  ?- Implement save arm precautions.  Prefer needle sticks in the dorsum of the hands or wrists.  No blood pressure measurements in left arm. ?- If blood transfusion is requested during hemodialysis sessions, please alert [place] prior to the session.  ?- If a hemodialysis catheter line culture is requested, please alert [place] as only hemodialysis nurses are able to collect those specimens.  ? ? ? ?Rhiana Morash W ?[facility] ?02/11/2022 ?9:14  AM ? ?___________________________________________________________ ? ?CC: ESRD ? ?Interval History/Subjective: Patient resting in bed main complaint is sacral pain and wants to safety belt off. Appetite improving, denies f/c/n/v/dyspnea ? ?Medications:  ?Current Facility-Administered Medications  ?Medication Dose Route Frequency Provider Last Rate Last Admin  ? 0.9 %  sodium chloride infusion  250 mL Intravenous PRN Broadus John, MD      ? acetaminophen (TYLENOL) tablet 650 mg  650 mg Oral Q4H PRN Broadus John, MD   650 mg at 02/11/22 0245  ? albuterol (PROVENTIL) (2.5 MG/3ML) 0.083% nebulizer solution 3 mL  3 mL Inhalation Q4H PRN Florencia Reasons, MD      ? amLODipine (NORVASC) tablet 10 mg  10 mg Oral Daily Florencia Reasons, MD   10 mg at 02/10/22 1154  ? aspirin EC tablet 81 mg  81 mg Oral Daily Florencia Reasons, MD   81 mg at 02/10/22 1154  ? atorvastatin (LIPITOR) tablet 80 mg  80 mg Oral Daily Florencia Reasons, MD   80 mg at 02/10/22 1154  ? atropine 1 % ophthalmic solution 1 drop  1 drop Both Eyes BID Florencia Reasons, MD   1 drop at 02/10/22 2110  ? buPROPion (WELLBUTRIN SR) 12 hr tablet 150 mg  150 mg Oral Daily Florencia Reasons, MD   150 mg at 02/10/22 1153  ? Chlorhexidine Gluconate Cloth 2 % PADS 6 each  6 each Topical Q0600 Gean Quint, MD   6 each at 02/11/22 [phone]  ? dorzolamide-timolol (COSOPT) 22.3-6.8 MG/ML ophthalmic solution 1 drop  1 drop Both Eyes BID Florencia Reasons, MD   1  drop at 02/10/22 2111  ? furosemide (LASIX) tablet 80 mg  80 mg Oral BID Reesa Chew, MD   80 mg at 02/10/22 1703  ? heparin injection 5,000 Units  5,000 Units Subcutaneous Blair Promise, MD   5,000 Units at 02/11/22 2620  ? hydrALAZINE (APRESOLINE) injection 10 mg  10 mg Intravenous Q8H PRN Florencia Reasons, MD   10 mg at 02/05/22 1119  ? hydrALAZINE (APRESOLINE) injection 5 mg  5 mg Intravenous Q20 Min PRN Broadus John, MD      ? hydrALAZINE (APRESOLINE) tablet 150 mg  150 mg Oral Q8H Nita Sells, MD   150 mg at 02/11/22 0617  ? HYDROmorphone (DILAUDID)  injection 0.5 mg  0.5 mg Intravenous Q4H PRN Florencia Reasons, MD   0.5 mg at 02/05/22 0423  ? insulin aspart (novoLOG) injection 0-6 Units  0-6 Units Subcutaneous TID WC Shary Key, DO   2 Units at 02/10/22 1659  ? labetalol (NORMODYNE) injection 10 mg  10 mg Intravenous Q2H PRN Etta Quill, DO   10 mg at 02/07/22 3559  ? labetalol (NORMODYNE) injection 10 mg  10 mg Intravenous Q10 min PRN Broadus John, MD      ? latanoprost (XALATAN) 0.005 % ophthalmic solution 1 drop  1 drop Both Eyes QHS Florencia Reasons, MD   1 drop at 02/10/22 2110  ? losartan (COZAAR) tablet 100 mg  100 mg Oral Daily Reesa Chew, MD   100 mg at 02/10/22 1155  ? metoCLOPramide (REGLAN) tablet 10 mg  10 mg Oral TID Marcelle Overlie, MD   10 mg at 02/11/22 7416  ? metoprolol tartrate (LOPRESSOR) tablet 50 mg  50 mg Oral BID Nita Sells, MD   50 mg at 02/10/22 2109  ? multivitamin (RENA-VIT) tablet 1 tablet  1 tablet Oral Vonzell Schlatter, MD   1 tablet at 02/10/22 2109  ? ondansetron (ZOFRAN) injection 4 mg  4 mg Intravenous Q6H PRN Broadus John, MD      ? oxyCODONE-acetaminophen (PERCOCET/ROXICET) 5-325 MG per tablet 1 tablet  1 tablet Oral Q6H PRN Florencia Reasons, MD   1 tablet at 02/11/22 0245  ? pregabalin (LYRICA) capsule 25 mg  25 mg Oral QHS Florencia Reasons, MD   25 mg at 02/10/22 2109  ? senna-docusate (Senokot-S) tablet 1 tablet  1 tablet Oral Daily PRN Florencia Reasons, MD      ? sevelamer carbonate (RENVELA) tablet 2,400 mg  2,400 mg Oral TID WC Florencia Reasons, MD   2,400 mg at 02/10/22 1626  ? sevelamer carbonate (RENVELA) tablet 800 mg  800 mg Oral PRN Spero Geralds, MD   800 mg at 02/05/22 1635  ? sodium chloride flush (NS) 0.9 % injection 3 mL  3 mL Intravenous Q12H Broadus John, MD   3 mL at 02/10/22 2115  ? sodium chloride flush (NS) 0.9 % injection 3 mL  3 mL Intravenous PRN Broadus John, MD      ?  ? ? ?Review of Systems: ?10 systems reviewed and negative except per interval history/subjective ? ?Physical Exam: ?Vitals:  ? 02/10/22  2100 02/11/22 0338  ?BP: (!) 171/43 (!) 147/66  ?Pulse: 69 68  ?Resp: 13 13  ?Temp: 98.6 ?F (37 ?C) 98.7 ?F (37.1 ?C)  ?SpO2: 97% 95%  ? ?No intake/output data recorded. ? ?Intake/Output Summary (Last 24 hours) at 02/11/2022 0914 ?Last data filed at 02/10/2022 2224 ?Gross per 24 hour  ?Intake 120 ml  ?  Output 2300 ml  ?Net -2180 ml  ? ? ?Constitutional: Chronically ill-appearing, no distress ?ENMT: ears and nose without scars or lesions, MMM ?CV: normal rate, no audible rub ?Respiratory: Bilateral chest rise, normal work of breathing ?Gastrointestinal: soft, nondistended ?Psych: alert, appropriate mood and affect ?Access: LUA BCF (removed the bandages, no bleeding), no infiltration ? ? ?Test Results ?I personally reviewed new and old clinical labs and radiology tests ?Lab Results  ?Component Value Date  ? NA 129 (L) 02/11/2022  ? K 4.0 02/11/2022  ? CL 92 (L) 02/11/2022  ? CO2 22 02/11/2022  ? BUN 45 (H) 02/11/2022  ? CREATININE 6.20 (H) 02/11/2022  ? CALCIUM 9.5 02/11/2022  ? ALBUMIN 3.0 (L) 02/10/2022  ? PHOS 5.7 (H) 02/10/2022  ? ? ?CBC ?Recent Labs  ?Lab 02/05/22 ?0350 02/05/22 ?0938 02/08/22 ?1829 02/09/22 ?0422 02/10/22 ?9371 02/11/22 ?6967  ?WBC 14.6*   < > 10.8* 9.8 10.3 11.3*  ?NEUTROABS 9.8*  --  6.5  --   --   --   ?HGB 9.6*   < > 10.4* 10.7* 11.4* 11.3*  ?HCT 32.9*   < > 34.2* 35.9* 37.5 37.1  ?MCV 86.8   < > 83.8 84.5 84.7 84.5  ?PLT 457*   < > 420* 374 355 331  ? < > = values in this interval not displayed.  ? ? ? ? ? ?

## 2022-02-11 NOTE — Transfer of Care (Signed)
Immediate Anesthesia Transfer of Care Note ? ?Patient: Tammy Moses ? ?Procedure(s) Performed: LEFT BYPASS GRAFT FEMORAL-POPLITEAL ARTERY (Left: Leg Upper) ?PATCH ANGIOPLASTY WITH XENOSURE 1X6CM BIOLOGIC PATCH (Left: Groin) ?LEFT FEMORAL ENDARTERECTOMY (Left: Groin) ? ?Patient Location: PACU ? ?Anesthesia Type:General ? ?Level of Consciousness: awake ? ?Airway & Oxygen Therapy: Patient Spontanous Breathing ? ?Post-op Assessment: Report given to RN and Post -op Vital signs reviewed and stable ? ?Post vital signs: Reviewed and stable ? ?Last Vitals:  ?Vitals Value Taken Time  ?BP    ?Temp    ?Pulse    ?Resp    ?SpO2    ? ? ?Last Pain:  ?Vitals:  ? 02/11/22 1205  ?TempSrc:   ?PainSc: 0-No pain  ?   ? ?Patients Stated Pain Goal: 2 (02/11/22 1205) ? ?Complications: No notable events documented. ?

## 2022-02-12 ENCOUNTER — Encounter (HOSPITAL_COMMUNITY): Payer: Self-pay | Admitting: Vascular Surgery

## 2022-02-12 DIAGNOSIS — E1122 Type 2 diabetes mellitus with diabetic chronic kidney disease: Secondary | ICD-10-CM | POA: Diagnosis not present

## 2022-02-12 DIAGNOSIS — E43 Unspecified severe protein-calorie malnutrition: Secondary | ICD-10-CM

## 2022-02-12 DIAGNOSIS — I70462 Atherosclerosis of autologous vein bypass graft(s) of the extremities with gangrene, left leg: Principal | ICD-10-CM

## 2022-02-12 DIAGNOSIS — I150 Renovascular hypertension: Secondary | ICD-10-CM | POA: Diagnosis not present

## 2022-02-12 DIAGNOSIS — E440 Moderate protein-calorie malnutrition: Secondary | ICD-10-CM | POA: Insufficient documentation

## 2022-02-12 DIAGNOSIS — G9341 Metabolic encephalopathy: Secondary | ICD-10-CM

## 2022-02-12 DIAGNOSIS — Z89611 Acquired absence of right leg above knee: Secondary | ICD-10-CM

## 2022-02-12 LAB — CBC
HCT: 34.9 % — ABNORMAL LOW (ref 36.0–46.0)
Hemoglobin: 10.3 g/dL — ABNORMAL LOW (ref 12.0–15.0)
MCH: 25.2 pg — ABNORMAL LOW (ref 26.0–34.0)
MCHC: 29.5 g/dL — ABNORMAL LOW (ref 30.0–36.0)
MCV: 85.3 fL (ref 80.0–100.0)
Platelets: 351 10*3/uL (ref 150–400)
RBC: 4.09 MIL/uL (ref 3.87–5.11)
RDW: 19.9 % — ABNORMAL HIGH (ref 11.5–15.5)
WBC: 16.6 10*3/uL — ABNORMAL HIGH (ref 4.0–10.5)
nRBC: 0 % (ref 0.0–0.2)

## 2022-02-12 LAB — GLUCOSE, CAPILLARY
Glucose-Capillary: 81 mg/dL (ref 70–99)
Glucose-Capillary: 173 mg/dL — ABNORMAL HIGH (ref 70–99)
Glucose-Capillary: 185 mg/dL — ABNORMAL HIGH (ref 70–99)
Glucose-Capillary: 185 mg/dL — ABNORMAL HIGH (ref 70–99)

## 2022-02-12 LAB — BASIC METABOLIC PANEL
Anion gap: 15 (ref 5–15)
BUN: 59 mg/dL — ABNORMAL HIGH (ref 6–20)
CO2: 23 mmol/L (ref 22–32)
Calcium: 9.6 mg/dL (ref 8.9–10.3)
Chloride: 96 mmol/L — ABNORMAL LOW (ref 98–111)
Creatinine, Ser: 7.87 mg/dL — ABNORMAL HIGH (ref 0.44–1.00)
GFR, Estimated: 6 mL/min — ABNORMAL LOW (ref >60)
Glucose, Bld: 132 mg/dL — ABNORMAL HIGH (ref 70–99)
Potassium: 5.5 mmol/L — ABNORMAL HIGH (ref 3.5–5.1)
Sodium: 134 mmol/L — ABNORMAL LOW (ref 135–145)

## 2022-02-12 MED ORDER — NEPRO/CARBSTEADY PO LIQD: 237.0000 mL | Freq: Two times a day (BID) | ORAL | Status: DC

## 2022-02-12 MED ORDER — BOOST / RESOURCE BREEZE PO LIQD CUSTOM
1.0000 | Freq: Two times a day (BID) | ORAL | Status: DC
  Administered 2022-02-12 – 2022-02-17 (×7): 1 via ORAL

## 2022-02-12 NOTE — Assessment & Plan Note (Signed)
-   Consult Dietitian ?

## 2022-02-12 NOTE — Evaluation (Signed)
Physical Therapy Evaluation ?Patient Details ?Name: Tammy Moses ?MRN: [id number] ?DOB: December 14, 1965 ?Today's Date: 02/12/2022 ? ?History of Present Illness ? 56 yo female presenting on 02/04/2022 to [facility] outpatient surgery for elective wound debridement and angiogram, preop found to have severe hypertension. Admitted to ICU. S/p Ultrasound-guided micropuncture access of the right common femoral artery and Second-order cannulation, left lower extremity angiogram on 4/19. S/p Left iliofemoral endarterectomy and Left femoral to below-knee popliteal artery bypass on 4/20. 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  ?Clinical Impression ? Pt presents with condition above and deficits mentioned below, see PT Problem List. PTA, pt was living with her niece who was assisting with ADLs (per chart review) and using a w/c for mobility. Currently, pt displays deficits in gross overall strength, static and dynamic balance, cognition, activity tolerance, and bil lower extremity skin integrity. Pt requiring maxAx2 for bed mobility and transfers to stand today. She would benefit from further skilled PT at a SNF to maximize her independence and safety with functional mobility to reduce her burden of care. Will continue to follow acutely.   ?   ? ?Recommendations for follow up therapy are one component of a multi-disciplinary discharge planning process, led by the attending physician.  Recommendations may be updated based on patient status, additional functional criteria and insurance authorization. ? ?Follow Up Recommendations Skilled nursing-short term rehab (<3 hours/day) ? ?  ?Assistance Recommended at Discharge Frequent or constant Supervision/Assistance  ?Patient can return home with the following ? Two people to help with walking and/or transfers;A lot of help with bathing/dressing/bathroom;Assistance with cooking/housework;Direct supervision/assist for medications management;Direct supervision/assist for  financial management;Assist for transportation;Help with stairs or ramp for entrance ? ?  ?Equipment Recommendations Other (comment) (defer to next venue of care)  ?Recommendations for Other Services ?    ?  ?Functional Status Assessment Patient has had a recent decline in their functional status and demonstrates the ability to make significant improvements in function in a reasonable and predictable amount of time.  ? ?  ?Precautions / Restrictions Precautions ?Precautions: Fall ?Precaution Comments: s/p R AKA 11/2021 ?Restrictions ?Other Position/Activity Restrictions: Recent R AKA  ? ?  ? ?Mobility ? Bed Mobility ?Overal bed mobility: Needs Assistance ?Bed Mobility: Supine to Sit, Sit to Supine ?  ?  ?Supine to sit: Max assist, +2 for physical assistance, HOB elevated ?Sit to supine: Max assist, +2 for physical assistance, HOB elevated ?  ?General bed mobility comments: Limited by confusion and blindness, requiring maxAx2 for all bed mobility aspects. ?  ? ?Transfers ?Overall transfer level: Needs assistance ?Equipment used: 2 person hand held assist ?Transfers: Sit to/from Stand ?Sit to Stand: Max assist, +2 physical assistance ?  ?  ?  ?  ?  ?General transfer comment: Max A +2 for power up. Requiring L knee blocked throughout. Limited strength and tolerance. Difficulty achieving upright position ?  ? ?Ambulation/Gait ?  ?  ?  ?  ?  ?  ?  ?General Gait Details: unable ? ?Stairs ?  ?  ?  ?  ?  ? ?Wheelchair Mobility ?  ? ?Modified Rankin (Stroke Patients Only) ?  ? ?  ? ?Balance Overall balance assessment: Needs assistance ?Sitting-balance support: No upper extremity supported, Feet supported (UE support on therapist) ?Sitting balance-Leahy Scale: Poor ?Sitting balance - Comments: Reliant on UE support and MIn A for sitting balance ?Postural control: Posterior lean ?Standing balance support: Bilateral upper extremity supported, During functional  activity ?Standing balance-Leahy Scale: Zero ?Standing balance  comment: Max A +2 ?  ?  ?  ?  ?  ?  ?  ?  ?  ?  ?  ?   ? ? ? ?Pertinent Vitals/Pain Pain Assessment ?Pain Assessment: Faces ?Faces Pain Scale: No hurt ?Pain Intervention(s): Monitored during session  ? ? ?Home Living Family/patient expects to be discharged to:: Skilled nursing facility ?Living Arrangements: Parent ?  ?  ?  ?  ?  ?  ?  ?  ?Additional Comments: Was living with her neice per chart  ?  ?Prior Function Prior Level of Function : Needs assist ?  ?  ?  ?  ?  ?  ?Mobility Comments: Pt unable to provide home information due to orientation and confusion. Wheelchair in room. ?ADLs Comments: Per chart, family assists ?  ? ? ?Hand Dominance  ? Dominant Hand: Right ? ?  ?Extremity/Trunk Assessment  ? Upper Extremity Assessment ?Upper Extremity Assessment: Defer to OT evaluation ?  ? ?Lower Extremity Assessment ?Lower Extremity Assessment: RLE deficits/detail;LLE deficits/detail;Generalized weakness ?RLE Deficits / Details: s/p R AKA 11/2021 with noted flaky skin and serosanguineous discharge from incision area, RN aware ?LLE Deficits / Details: noted erythema at great toe; detects touch at toes, but unsure of extent of sensation; functional weakness noted ?  ? ?   ?Communication  ? Communication: No difficulties  ?Cognition Arousal/Alertness: Awake/alert ?Behavior During Therapy: Flat affect, Restless ?Overall Cognitive Status: History of cognitive impairments - at baseline ?  ?  ?  ?  ?  ?  ?  ?  ?  ?  ?  ?  ?  ?  ?  ?  ?General Comments: Pt disoriented and stating she is currently in the second [place] of a motel. Pt reporting she needs a new key to get in her motel room. Reports she did have sx yesterday (which is accurate). Requiring increased time and cues ?  ?  ? ?  ?General Comments General comments (skin integrity, edema, etc.): VSS ? ?  ?Exercises    ? ?Assessment/Plan  ?  ?PT Assessment Patient needs continued PT services  ?PT Problem List Decreased strength;Decreased activity tolerance;Decreased  balance;Decreased mobility;Decreased cognition;Decreased safety awareness;Cardiopulmonary status limiting activity;Decreased skin integrity ? ?   ?  ?PT Treatment Interventions DME instruction;Functional mobility training;Therapeutic activities;Therapeutic exercise;Neuromuscular re-education;Balance training;Cognitive remediation;Patient/family education;Wheelchair mobility training   ? ?PT Goals (Current goals can be found in the Care Plan section)  ?Acute Rehab PT Goals ?Patient Stated Goal: to get a key to her motel room ?PT Goal Formulation: With patient ?Time For Goal Achievement: 03/20/2022 ?Potential to Achieve Goals: Fair ? ?  ?Frequency Min 2X/week ?  ? ? ?Co-evaluation PT/OT/SLP Co-Evaluation/Treatment: Yes ?Reason for Co-Treatment: Necessary to address cognition/behavior during functional activity;For patient/therapist safety;To address functional/ADL transfers ?PT goals addressed during session: Mobility/safety with mobility;Balance ?  ?  ? ? ?  ?AM-PAC PT "6 Clicks" Mobility  ?Outcome Measure Help needed turning from your back to your side while in a flat bed without using bedrails?: A Lot ?Help needed moving from lying on your back to sitting on the side of a flat bed without using bedrails?: Total ?Help needed moving to and from a bed to a chair (including a wheelchair)?: Total ?Help needed standing up from a chair using your arms (e.g., wheelchair or bedside chair)?: Total ?Help needed to walk in hospital room?: Total ?Help needed climbing 3-5 steps with a railing? : Total ?6 Click  Score: 7 ? ?  ?End of Session   ?Activity Tolerance: Patient tolerated treatment well ?Patient left: in bed;with call bell/phone within reach;with bed alarm set;with nursing/sitter in room ?Nurse Communication: Mobility status ?PT Visit Diagnosis: Unsteadiness on feet (R26.81);Muscle weakness (generalized) (M62.81) ?  ? ?Time: 2637-8588 ?PT Time Calculation (min) (ACUTE ONLY): 13 min ? ? ?Charges:   PT Evaluation ?$PT Eval  Moderate Complexity: 1 Mod ?  ?  ?   ? ? ?Moishe Spice, PT, DPT ?Acute Rehabilitation Services  ?Pager: [phone] ?Office: [phone] ? ? ?Maretta Bees Pettis ?02/12/2022, 4:33 PM ? ?

## 2022-02-12 NOTE — Assessment & Plan Note (Deleted)
Patient's family reported concerns of intermittent confusion at home for several weeks prior to admission. ? ?Here, she has demonstrated the same, occasional, mostly at night, disorientation and agitation consistent with delirium ?- Consult Palliative Care ?Delirium precautions:  ? -Lights and TV off, minimize interruptions at night ? -Blinds open and lights on during day ? -Frequent reorientation ? -PT/OT as able ? -Avoid sedation medications/Beers list medications ?  ?

## 2022-02-12 NOTE — Evaluation (Signed)
OT Cancellation Note ? ?Patient Details ?Name: Tammy Moses ?MRN: [id number] ?DOB: [birth year] ? ? ?Cancelled Treatment:    Reason Eval/Treat Not Completed: Patient at procedure or test/ unavailable (HD. Will return as schedule allows.) ? ?Tammy Moses ?Marrietta Thunder MSOT, OTR/L ?Acute Rehab ?Pager: [phone] ?Office: [phone] ?02/12/2022, 9:09 AM ?

## 2022-02-12 NOTE — Progress Notes (Addendum)
?   02/12/22 1142  ?Vitals  ?Temp 98.7 ?F (37.1 ?C)  ?Temp Source Oral  ?BP (!) 132/54  ?Pulse Rate 71  ?Resp 15  ?Oxygen Therapy  ?SpO2 94 %  ?O2 Device Room Air  ?Dialysis Weight  ?Weight 45.4 kg  ?Post-Hemodialysis Assessment  ?Rinseback Volume (mL) 250 mL  ?KECN 184 V  ?Dialyzer Clearance Lightly streaked  ?Duration of HD Treatment -hour(s) 3.75 hour(s)  ?Hemodialysis Intake (mL) 700 mL  ?UF Total -Machine (mL) 1021 mL  ?Net UF (mL) 321 mL  ?Tolerated HD Treatment No (Comment)  ?Post-Hemodialysis Comments tx complete,,pt stable  ?Fistula / Graft Left Upper arm  ?No placement date or time found.   Orientation: Left  Access Location: Upper arm  ?Site Condition No complications  ?Fistula / Graft Assessment Present;Thrill;Bruit  ?Status Deaccessed  ? ?HD tx complete, pt noted with frequent machine alarms due to moving around in bed, frequent reminders needed to keep access arm still during tx. Pt yelling out occasionally with requests for repositioning and to have tx terminated. Pt able to complete tx with reeducation about compliance of HD. Given prn pain medication for reports of generalized pain-partial effects noted. Pt noted with hypotensive episode where she became lethargic, although able to respond verbally, SBP-70s, UF stopped NS bolus 200 ml given with effectiveness noted. Dr. Augustin Coupe on unit and made aware. UF goal lowered to 1 liter net, however pt noted with intermittent hypotension, UF paused at these times and then resumed. Pt remained asymptomatic for remainder of tx. Unable to meet goal of 1 liter. Pt stable at this time. Alert and verbally responsive. Dr. Augustin Coupe notified. ?

## 2022-02-12 NOTE — Progress Notes (Signed)
Initial Nutrition Assessment ? ?DOCUMENTATION CODES:  ? ?Non-severe (moderate) malnutrition in context of chronic illness ? ?INTERVENTION:  ? ?Boost Breeze po TID, each supplement provides 250 kcal and 9 grams of protein. ? ?Continue Rena-vit daily. ? ?Recommend adding a scheduled bowel regimen, no BM documented in 6 days.  ? ?NUTRITION DIAGNOSIS:  ? ?Moderate Malnutrition related to chronic illness (ESRD on HD) as evidenced by moderate muscle depletion, severe muscle depletion, mild fat depletion, moderate fat depletion. ? ?GOAL:  ? ?Patient will meet greater than or equal to 90% of their needs ? ?MONITOR:  ? ?PO intake, Supplement acceptance, Skin, Labs ? ?REASON FOR ASSESSMENT:  ? ?Malnutrition Screening Tool, Consult ?Assessment of nutrition requirement/status ? ?ASSESSMENT:  ? 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 Developed severe HTN pre-op and was admitted to the ICU on 4/13. PMH includes ESRD on HD, HTN, DM, PVD, R AKA, blind R eye, chronic constipation, depression, neuropathy, retinopathy, GERD, tobacco dependence, vitamin D deficiency. ? ?4/20 S/P L fem-pop bypass  ? ?Currently on a renal diet with 1200 ml fluid restriction.  ?Meal intakes documented at 75-100%.  ? ?Patient reports that she has been eating well. ?She does not like the Nepro supplements that well, but knows she needs more protein in her diet. She agreed to try [employer] and liked it. Will change supplement from Nepro to [employer]. Discussed with RN.  ? ?Labs reviewed. Na 134, K 5.5, phos 5.7 (4/19) ?CBG: 81-173 ? ?Medications reviewed and include Lasix, Novolog, Reglan, Rena-vit, Renvela. ? ?Weight history reviewed. Weight is down (as expected) since R AKA on 12/21/21. ?EDW 43.5 kg per Nephrology note. ?Currently 45.4 kg, up with fluids s/p procedure 4/20.  ? ?NUTRITION - FOCUSED PHYSICAL EXAM: ? ?Flowsheet Row Most Recent Value  ?Orbital Region No depletion  ?Upper Arm Region No  depletion  ?Thoracic and Lumbar Region Moderate depletion  ?Buccal Region Mild depletion  ?Temple Region Moderate depletion  ?Clavicle Bone Region Severe depletion  ?Clavicle and Acromion Bone Region Severe depletion  ?Scapular Bone Region Moderate depletion  ?Dorsal Hand Severe depletion  ?Patellar Region Unable to assess  ?Anterior Thigh Region Unable to assess  ?Posterior Calf Region Unable to assess  ?Edema (RD Assessment) Unable to assess  ?Hair Reviewed  ?Eyes Reviewed  ?Mouth Reviewed  ?Skin Reviewed  ?Nails Reviewed  ? ?  ? ? ?Diet Order:   ?Diet Order   ? ?       ?  Diet renal with fluid restriction Fluid restriction: 1200 mL Fluid; Room service appropriate? Yes; Fluid consistency: Thin  Diet effective now       ?  ? ?  ?  ? ?  ? ? ?EDUCATION NEEDS:  ? ?No education needs have been identified at this time ? ?Skin:  Skin Assessment: Skin Integrity Issues: ?Skin Integrity Issues:: Diabetic Ulcer ?Diabetic Ulcer: great toe on left foot ? ?Last BM:  4/15 type 5 ? ?Height:  ? ?Ht Readings from Last 1 Encounters:  ?02/11/22 5\' 1"  (1.549 m)  ? ? ?Weight:  ? ?Wt Readings from Last 1 Encounters:  ?02/12/22 45.4 kg  ? ? ?BMI:  Body mass index is 18.91 kg/m?. ? ?Estimated Nutritional Needs:  ? ?Kcal:  1500-1700 ? ?Protein:  70-80 gm ? ?Fluid:  1 L + UOP ? ? ? ?Lucas Mallow RD, LDN, CNSC ?Please refer to Amion for contact information.                                                       ? ?

## 2022-02-12 NOTE — Progress Notes (Signed)
Nephrology Follow-Up Consult note ? ? ?Assessment/Recommendations: Tammy Moses is a/an 56 y.o. female with a past medical history significant for ESRD on HD, PAD, DM2, HTN initially presented for outpatient elective wound debridement and angio however was found to have severe uncontrolled HTN.  ?  ?# ESRD:  ?-outpatient orders: [facility].  MWF.  [id number].  3.5 hours.  350/600.  2K, 2.5 Cal.  LUE AVF.  Heparin 1500 units bolus, 500 units maintenance.  Venofer 50 mg q. treatment, EPO 11,800 units, Sensipar 30 q. Mondays. Down to 43-43.8 kg here in the hospital. ?-Continue MWF schedule; tolerated 3L net UF on 4/17, 2.3 4/19 ?Seen on HD ?Decr goal from 2.5L net to 1L net as tolerated with drop in BP. ?No complaints tho. ?  ?# Volume/ severe hypertension: EDW 43.5kg. Attempt to achieve EDW as tolerated ?-Continue labetalol and hydral, losartan, Lasix.  Increased  losartan to 100mg  daily. ?  ?# PAD s/p right AKA w/ nonhealing wound/gangrene, leukocytosis ?-recent rt AKA after unsuccessful revascularization (staples still in )l  ?- VVS POD1 left fem below knee pop bypass with ipsilateral non reversed GSV + left iliofemoral endarterectomy. ?-abx per primary service if indicated ?  ?# Anemia of Chronic Kidney Disease: Hemoglobin 11.4. Currently receives venofer, hold epo.  ?  ?# Secondary Hyperparathyroidism/Hyperphosphatemia: resumed home binders and sensipar p5.7 4/19 on sevelamer 3 tabs TIDM ?  ?# Vascular access: LUE BCF ? ?Possibly requiring placement at SNF ?  ?# Additional recommendations: ?- Dose all meds for creatinine clearance < 10 ml/min  ?- Unless absolutely necessary, no MRIs with gadolinium.  ?- Implement save arm precautions.  Prefer needle sticks in the dorsum of the hands or wrists.  No blood pressure measurements in left arm. ?- If blood transfusion is requested during hemodialysis sessions, please alert [place] prior to the session.  ?- If a hemodialysis catheter line culture is requested, please alert [place]  as only hemodialysis nurses are able to collect those specimens.  ? ? ? ?Demaya Hardge W ?[facility] ?02/12/2022 ?8:49 AM ? ?___________________________________________________________ ? ?CC: ESRD ? ?Interval History/Subjective: Patient resting in bed main w/ needles in on dialysis. Marland Kitchen Appetite improving, denies f/c/n/v/dyspnea ? ?Medications:  ?Current Facility-Administered Medications  ?Medication Dose Route Frequency Provider Last Rate Last Admin  ? 0.9 %  sodium chloride infusion  500 mL Intravenous Once PRN Dagoberto Ligas, PA-C      ? acetaminophen (TYLENOL) tablet 325-650 mg  325-650 mg Oral Q4H PRN Dagoberto Ligas, PA-C      ? Or  ? acetaminophen (TYLENOL) suppository 325-650 mg  325-650 mg Rectal Q4H PRN Dagoberto Ligas, PA-C      ? albuterol (PROVENTIL) (2.5 MG/3ML) 0.083% nebulizer solution 3 mL  3 mL Inhalation Q4H PRN Dagoberto Ligas, PA-C      ? amLODipine (NORVASC) tablet 10 mg  10 mg Oral Daily Dagoberto Ligas, PA-C   10 mg at 02/11/22 7681  ? aspirin EC tablet 81 mg  81 mg Oral Daily Dagoberto Ligas, PA-C   81 mg at 02/11/22 1572  ? atorvastatin (LIPITOR) tablet 80 mg  80 mg Oral Daily Dagoberto Ligas, PA-C   80 mg at 02/11/22 6203  ? atropine 1 % ophthalmic solution 1 drop  1 drop Both Eyes BID Dagoberto Ligas, PA-C   1 drop at 02/11/22 2207  ? buPROPion [facility] SR) 12 hr tablet 150 mg  150 mg Oral Daily Dagoberto Ligas, PA-C   150 mg at 02/11/22 5597  ? Chlorhexidine Gluconate  Cloth 2 % PADS 6 each  6 each Topical Q0600 Dagoberto Ligas, PA-C   6 each at 02/12/22 7846  ? clopidogrel (PLAVIX) tablet 75 mg  75 mg Oral Q0600 Dagoberto Ligas, PA-C   75 mg at 02/12/22 9629  ? dorzolamide-timolol (COSOPT) 22.3-6.8 MG/ML ophthalmic solution 1 drop  1 drop Both Eyes BID Dagoberto Ligas, PA-C   1 drop at 02/11/22 2206  ? feeding supplement (NEPRO CARB STEADY) liquid 237 mL  237 mL Oral BID BM Danford, Suann Larry, MD      ? furosemide (LASIX) tablet 80 mg  80 mg Oral 2 times per  day on Sun Tue Thu Sat Dagoberto Ligas, PA-C      ? guaiFENesin-dextromethorphan (ROBITUSSIN DM) 100-10 MG/5ML syrup 15 mL  15 mL Oral Q4H PRN Dagoberto Ligas, PA-C      ? heparin injection 5,000 Units  5,000 Units Subcutaneous Q8H Dagoberto Ligas, PA-C   5,000 Units at 02/12/22 5284  ? hydrALAZINE (APRESOLINE) injection 10 mg  10 mg Intravenous Q8H PRN Dagoberto Ligas, PA-C   10 mg at 02/05/22 1119  ? hydrALAZINE (APRESOLINE) tablet 150 mg  150 mg Oral Q8H Dagoberto Ligas, PA-C   150 mg at 02/12/22 1324  ? insulin aspart (novoLOG) injection 0-6 Units  0-6 Units Subcutaneous TID WC Dagoberto Ligas, PA-C   2 Units at 02/10/22 1659  ? labetalol (NORMODYNE) injection 10 mg  10 mg Intravenous Q2H PRN Dagoberto Ligas, PA-C   10 mg at 02/07/22 4010  ? latanoprost (XALATAN) 0.005 % ophthalmic solution 1 drop  1 drop Both Eyes QHS Dagoberto Ligas, PA-C   1 drop at 02/11/22 2206  ? losartan (COZAAR) tablet 100 mg  100 mg Oral Daily Dagoberto Ligas, PA-C   100 mg at 02/11/22 2725  ? metoCLOPramide (REGLAN) tablet 10 mg  10 mg Oral TID AC Dagoberto Ligas, PA-C   10 mg at 02/11/22 3664  ? metoprolol tartrate (LOPRESSOR) injection 2-5 mg  2-5 mg Intravenous Q2H PRN Dagoberto Ligas, PA-C      ? metoprolol tartrate (LOPRESSOR) tablet 50 mg  50 mg Oral BID Dagoberto Ligas, PA-C   50 mg at 02/11/22 2258  ? multivitamin (RENA-VIT) tablet 1 tablet  1 tablet Oral QHS Dagoberto Ligas, PA-C   1 tablet at 02/11/22 2258  ? ondansetron (ZOFRAN) injection 4 mg  4 mg Intravenous Q6H PRN Dagoberto Ligas, PA-C      ? oxyCODONE-acetaminophen (PERCOCET/ROXICET) 5-325 MG per tablet 1-2 tablet  1-2 tablet Oral Q4H PRN Dagoberto Ligas, PA-C   2 tablet at 02/12/22 4034  ? pantoprazole (PROTONIX) EC tablet 40 mg  40 mg Oral Daily Dagoberto Ligas, PA-C   40 mg at 02/11/22 2258  ? phenol (CHLORASEPTIC) mouth spray 1 spray  1 spray Mouth/Throat PRN Dagoberto Ligas, PA-C      ? pregabalin (LYRICA) capsule 25 mg  25 mg Oral QHS Dagoberto Ligas, PA-C   25 mg at 02/11/22 2258  ? senna-docusate (Senokot-S) tablet 1 tablet  1 tablet Oral Daily PRN Dagoberto Ligas, PA-C      ? sevelamer carbonate (RENVELA) tablet 2,400 mg  2,400 mg Oral TID WC Dagoberto Ligas, PA-C   2,400 mg at 02/10/22 1626  ? sevelamer carbonate (RENVELA) tablet 800 mg  800 mg Oral PRN Dagoberto Ligas, PA-C   800 mg at 02/05/22 1635  ?  ? ? ?Review of Systems: ?10 systems reviewed and negative except per interval history/subjective ? ?Physical Exam: ?Vitals:  ? 02/12/22 0800 02/12/22 0815  ?  BP: (!) 150/37 (!) 79/32  ?Pulse: 68 73  ?Resp: 13   ?Temp:    ?SpO2: 97%   ? ?No intake/output data recorded. ? ?Intake/Output Summary (Last 24 hours) at 02/12/2022 0849 ?Last data filed at 02/11/2022 2030 ?Gross per 24 hour  ?Intake 903 ml  ?Output 300 ml  ?Net 603 ml  ? ? ?Constitutional: Chronically ill-appearing, no distress ?ENMT: ears and nose without scars or lesions, MMM ?CV: normal rate, no audible rub ?Respiratory: Bilateral chest rise, normal work of breathing ?Gastrointestinal: soft, nondistended ?Psych: alert, appropriate mood and affect ?Access: LUA BCF, no infiltration ? ? ?Test Results ?I personally reviewed new and old clinical labs and radiology tests ?Lab Results  ?Component Value Date  ? NA 134 (L) 02/12/2022  ? K 5.5 (H) 02/12/2022  ? CL 96 (L) 02/12/2022  ? CO2 23 02/12/2022  ? BUN 59 (H) 02/12/2022  ? CREATININE 7.87 (H) 02/12/2022  ? CALCIUM 9.6 02/12/2022  ? ALBUMIN 3.0 (L) 02/10/2022  ? PHOS 5.7 (H) 02/10/2022  ? ? ?CBC ?Recent Labs  ?Lab 02/08/22 ?0428 02/09/22 ?0422 02/10/22 ?1610 02/11/22 ?9604 02/12/22 ?0123  ?WBC 10.8*   < > 10.3 11.3* 16.6*  ?NEUTROABS 6.5  --   --   --   --   ?HGB 10.4*   < > 11.4* 11.3* 10.3*  ?HCT 34.2*   < > 37.5 37.1 34.9*  ?MCV 83.8   < > 84.7 84.5 85.3  ?PLT 420*   < > 355 331 351  ? < > = values in this interval not displayed.  ? ? ? ? ? ?

## 2022-02-12 NOTE — Progress Notes (Signed)
?  Progress Note ? ? ? ?02/12/2022 ?8:04 AM ?1 Day Post-Op ? ?Subjective:  Moderate pain ? ? ?Vitals:  ? 02/12/22 0755 02/12/22 0800  ?BP: (!) 132/55 (!) 150/37  ?Pulse: 66 68  ?Resp: 13 13  ?Temp:    ?SpO2: 96% 97%  ? ?Physical Exam ?Lungs:  non labored ?Incisions:  R AKA with some areas slow to heal ?Extremities:  L GT tip dry gangrene ?Left leg with ACE, excellent multiphasic peroneal signal ?Neurologic: A&O ? ?CBC ?   ?Component Value Date/Time  ? WBC 16.6 (H) 02/12/2022 0123  ? RBC 4.09 02/12/2022 0123  ? HGB 10.3 (L) 02/12/2022 0123  ? HGB 12.4 12/13/2017 1416  ? HCT 34.9 (L) 02/12/2022 0123  ? HCT 37.8 12/13/2017 1416  ? PLT 351 02/12/2022 0123  ? PLT 254 12/13/2017 1416  ? MCV 85.3 02/12/2022 0123  ? MCV 83 12/13/2017 1416  ? MCH 25.2 (L) 02/12/2022 0123  ? MCHC 29.5 (L) 02/12/2022 0123  ? RDW 19.9 (H) 02/12/2022 0123  ? RDW 15.8 (H) 12/13/2017 1416  ? LYMPHSABS 2.0 02/08/2022 0428  ? LYMPHSABS 4.1 (H) 12/13/2017 1416  ? MONOABS 1.9 (H) 02/08/2022 0428  ? EOSABS 0.4 02/08/2022 0428  ? EOSABS 0.3 12/13/2017 1416  ? BASOSABS 0.1 02/08/2022 0428  ? BASOSABS 0.1 12/13/2017 1416  ? ? ?BMET ?   ?Component Value Date/Time  ? NA 134 (L) 02/12/2022 0123  ? NA 146 (H) 05/10/2017 0950  ? K 5.5 (H) 02/12/2022 0123  ? CL 96 (L) 02/12/2022 0123  ? CO2 23 02/12/2022 0123  ? GLUCOSE 132 (H) 02/12/2022 0123  ? BUN 59 (H) 02/12/2022 0123  ? BUN 38 (H) 05/10/2017 0950  ? CREATININE 7.87 (H) 02/12/2022 0123  ? CREATININE 3.40 (H) 03/11/2017 1505  ? CALCIUM 9.6 02/12/2022 0123  ? GFRNONAA 6 (L) 02/12/2022 0123  ? GFRNONAA 15 (L) 03/11/2017 1505  ? GFRAA 5 (L) 05/05/2020 1314  ? GFRAA 17 (L) 03/11/2017 1505  ? ? ?INR ?   ?Component Value Date/Time  ? INR 1.2 10/21/2021 1425  ? ? ? ?Intake/Output Summary (Last 24 hours) at 02/12/2022 0804 ?Last data filed at 02/11/2022 2030 ?Gross per 24 hour  ?Intake 903 ml  ?Output 300 ml  ?Net 603 ml  ? ? ? ? ?Assessment/Plan:  56 y.o. female with slow to heal R AKA, now 1 Day Post-Op left  iliofemoral endarterectomy, left femoral to below-knee popliteal artery bypass with ipsilateral non-reversed GSV.  ? ?Doing well ?Excellent signal ?Will work on multimodal pain control ?Dialysis scheduled for today ?Dressings down tomorrow, and leg re-wrapped with Ace  ?OOB today as tolerated ?PT/OT ? ?[name], MD ?[facility] ?Office Phone Number: [phone] ?02/12/2022 8:04 AM ? ? ? ? ? ?

## 2022-02-12 NOTE — Assessment & Plan Note (Addendum)
S/p fem-pop bypass 4/20 by Dr. Virl Cagey ?Cleared for d/c to SNF from post-op standpoint ? ?- Follow up with Dr. Virl Cagey Vascular surgery after discharge ?-Continue aspirin, atorvastatin, Plavix ? ?

## 2022-02-12 NOTE — Progress Notes (Signed)
?Progress Note ? ? ?Patient: Tammy Moses JJH:[id number] DOB: September 14, 1966 DOA: 02/04/2022     8 ?DOS: the patient was seen and examined on 02/12/2022 at 11:17AM ?  ? ? ? ?Brief hospital course: ?Mrs. Montemurro is a 56 y.o. F with ESRD on HD, HTN, DM, PVD s/p R AKA 2 months ago, who presented with severe hypertension from preop. ? ?Evidently, patient had an AKA on right in Feb, was discharged from SNF with no pain medication, without having had her staples removed, with obvious new gangrene of her left great toe, and without a Vascular surgery follow up until April. At that appointment, the patient was scheduled urgently for debridement of mild dehiscence of the right AKA stump as well as LLE angiogram.    ? ?On the day of surgery, unfortunately, the patient's BP in preop was 818 mmHg systolic, she vomited and had chest pain, so surgery was deferred and she was admitted to the ICU for BP control on Cleviprex. ? ? ?4/13: Sent from preop for admission for severe HTN to ICU ?4/14: BP self corrected rapidly, put back on oral agents, transferred to floor ?4/17: Vascular surgery consulted ?4/19: Angiography completed ?4/20: left fem-pop Bypass by Dr. Virl Cagey ? ? ? ? ? ? 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 Virl Cagey ?- Consult vascular surgery, apprecaite cares ? ? ?-Continue aspirin, atorvastatin, Plavix ? ? ?Hypertensive urgency ?Initial presenting complaint. ? ?Seems to be very labile.  ?calcific atherosclerosis confounds BP measurement at times.   ?Today BP controlled ?-Continue amlodipine, fursoemide, hydralazine, metoprolol, losartan ? ? ?Acute metabolic encephalopathy ?Patient's family reported concerns of intermittent confusion at home for several weeks prior to admission. ? ?Here, she has demonstrated the same, occasional, mostly at night, disorientation and agitation consistent with delirium ? ?Delirium ?See  above ? ?  ?   ? ?ESRD (end stage renal disease) ([place]) -  Dialyzes at [facility].  On Monday Wednesday Friday. ?- Consult nephrology for HD ? ?Controlled type 2 diabetes mellitus with chronic kidney disease on chronic dialysis ([place]) ?Glucose normal ?-Continue SS corrections ?  ? ?Blindness ?   ? ? ? ? ? ? ? ? ? ?Subjective: Patient is eager to be discharged.  She has no headache, chest pain, dyspnea. ? ? ? ? ?Physical Exam: ?Vitals:  ? 02/12/22 1130 02/12/22 1140 02/12/22 1142 02/12/22 1234  ?BP: (!) 94/56 107/60 (!) 132/54 (!) 117/49  ?Pulse: 76 74 71   ?Resp:   15   ?Temp:   98.7 ?F (37.1 ?C) 98.5 ?F (36.9 ?C)  ?TempSrc:   Oral Oral  ?SpO2:   94% 98%  ?Weight:   45.4 kg   ?Height:      ? 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 ? ? ? ?Data Reviewed: ?Discussed with vascular surgery, nursing notes reviewed, op notes reviewed, vital signs reviewed ?Labs are notable for potassium which is up, dialysis pending ?White blood cell up to 16 K, postoperatively ?Hemoglobin 10.3, stable ?Glucose good ? ? ?Family Communication:   ? ? ? ?  Disposition: ?Status is: Inpatient ? ? ? ? ? ? ? ? ?Author: ?Edwin Dada, MD ?02/12/2022 1:14 PM ? ?For on call review [email or web address].  ? ? ?

## 2022-02-12 NOTE — Progress Notes (Signed)
PT Cancellation Note ? ?Patient Details ?Name: Tammy Moses ?MRN: [id number] ?DOB: Jan 15, 1966 ? ? ?Cancelled Treatment:    Reason Eval/Treat Not Completed: Patient at procedure or test/unavailable. Pt at HD treatment. Will plan to follow-up later as time permits. ? ?Moishe Spice, PT, DPT ?Acute Rehabilitation Services  ?Pager: [phone] ?Office: [phone] ? ? ? ?Tammy Moses ?02/12/2022, 7:53 AM ? ? ?

## 2022-02-12 NOTE — Evaluation (Signed)
Occupational Therapy Evaluation ?Patient Details ?Name: Tammy Moses ?MRN: [id number] ?DOB: 07-15-1966 ?Today's Date: 02/12/2022 ? ? ?History of Present Illness 56 yo female presenting on 02/04/2022 to [facility] outpatient surgery for elective wound debridement and angiogram, preop found to have severe hypertension. Admitted to ICU. S/p Ultrasound-guided micropuncture access of the right common femoral artery and Second-order cannulation, left lower extremity angiogram on 4/19. S/p Left iliofemoral endarterectomy and Left femoral to below-knee popliteal artery bypass on 4/20. PMH including ESRD on HD, HTN, DM, PVD s/p R AKA (11/2021)  ? ?Clinical Impression ?  ?PTA, pt was living with her niece who was assisting with ADLs (per chart review) and using a w/c for mobility. Pt currently requiring Max A +2 for ADL and functional transfers. Pt presenting with decreased balance, cognition, strength, and activity tolerance. Pt confused and disoriented (reporting she is currently at a motel after surgery yesterday).  Pt would benefit from further acute OT to facilitate safe dc. Recommend dc to SNF for further OT to optimize safety, independence with ADLs, and return to PLOF.  ?   ? ?Recommendations for follow up therapy are one component of a multi-disciplinary discharge planning process, led by the attending physician.  Recommendations may be updated based on patient status, additional functional criteria and insurance authorization.  ? ?Follow Up Recommendations ? Skilled nursing-short term rehab (<3 hours/day)  ?  ?Assistance Recommended at Discharge Frequent or constant Supervision/Assistance  ?Patient can return home with the following A lot of help with walking and/or transfers;A lot of help with bathing/dressing/bathroom ? ?  ?Functional Status Assessment ? Patient has had a recent decline in their functional status and/or demonstrates limited ability to make significant improvements in function in a reasonable and  predictable amount of time  ?Equipment Recommendations ? Other (comment) (Defer to next venue)  ?  ?Recommendations for Other Services PT consult ? ? ?  ?Precautions / Restrictions Precautions ?Precautions: Fall ?Restrictions ?Other Position/Activity Restrictions: Recent R AKA  ? ?  ? ?Mobility Bed Mobility ?Overal bed mobility: Needs Assistance ?Bed Mobility: Supine to Sit, Sit to Supine ?  ?  ?Supine to sit: Max assist, +2 for physical assistance ?Sit to supine: Max assist, +2 for physical assistance ?  ?General bed mobility comments: Limited by confusion and blindness ?  ? ?Transfers ?Overall transfer level: Needs assistance ?Equipment used: 2 person hand held assist ?Transfers: Sit to/from Stand ?Sit to Stand: Max assist, +2 physical assistance ?  ?  ?  ?  ?  ?General transfer comment: Max A +2 for power up. Requiring R knee blocked throughout. Limited strength and tolerance. Difficulty achieving upright position ?  ? ?  ?Balance Overall balance assessment: Needs assistance ?Sitting-balance support: No upper extremity supported, Feet supported ?Sitting balance-Leahy Scale: Poor ?Sitting balance - Comments: Reliant on UE support and MIn A for sitting balance ?Postural control: Posterior lean ?Standing balance support: Bilateral upper extremity supported, During functional activity ?Standing balance-Leahy Scale: Zero ?Standing balance comment: Max A +2 ?  ?  ?  ?  ?  ?  ?  ?  ?  ?  ?  ?   ? ?ADL either performed or assessed with clinical judgement  ? ?ADL Overall ADL's : Needs assistance/impaired ?  ?  ?  ?  ?  ?  ?  ?  ?  ?  ?  ?  ?  ?  ?  ?  ?  ?  ?  ?General ADL Comments: Max A for  ADLs  ? ? ? ?Vision   ?   ?   ?Perception   ?  ?Praxis   ?  ? ?Pertinent Vitals/Pain Pain Assessment ?Pain Assessment: Faces ?Faces Pain Scale: No hurt ?Pain Intervention(s): Monitored during session  ? ? ? ?Hand Dominance Right ?  ?Extremity/Trunk Assessment Upper Extremity Assessment ?Upper Extremity Assessment: Generalized  weakness ?  ?Lower Extremity Assessment ?Lower Extremity Assessment: Defer to PT evaluation ?  ?  ?  ?Communication Communication ?Communication: No difficulties ?  ?Cognition Arousal/Alertness: Awake/alert ?Behavior During Therapy: Flat affect, Restless ?Overall Cognitive Status: History of cognitive impairments - at baseline ?  ?  ?  ?  ?  ?  ?  ?  ?  ?  ?  ?  ?  ?  ?  ?  ?General Comments: Pt disoriented and stating she is currently in the second floor of a motel. Pt reporting she needs a new key to get in her motel room. Reports she did have sx yesterday (which is accurate). Requiring increased time and cues ?  ?  ?General Comments  VSS ? ?  ?Exercises   ?  ?Shoulder Instructions    ? ? ?Home Living Family/patient expects to be discharged to:: Skilled nursing facility ?Living Arrangements: Parent ?  ?  ?  ?  ?  ?  ?  ?  ?  ?  ?  ?  ?  ?  ?  ?Additional Comments: Was living with her mother per chart ?  ? ?  ?Prior Functioning/Environment Prior Level of Function : Needs assist ?  ?  ?  ?  ?  ?  ?Mobility Comments: Pt unable to provide home information due to orientation and confusion. Wheelchair in room. ?ADLs Comments: Per chart, mom assists ?  ? ?  ?  ?OT Problem List: Decreased strength;Decreased range of motion;Decreased activity tolerance;Impaired balance (sitting and/or standing);Decreased knowledge of use of DME or AE;Decreased knowledge of precautions ?  ?   ?OT Treatment/Interventions: Self-care/ADL training;Therapeutic exercise;Energy conservation;DME and/or AE instruction;Therapeutic activities;Patient/family education  ?  ?OT Goals(Current goals can be found in the care plan section) Acute Rehab OT Goals ?Patient Stated Goal: "Get a new key card for my motel - I don't know how I will get in" ?OT Goal Formulation: With patient ?Time For Goal Achievement: 03/09/2022 ?Potential to Achieve Goals: Good  ?OT Frequency: Min 2X/week ?  ? ?Co-evaluation   ?  ?  ?  ?  ? ?  ?AM-PAC OT "6 Clicks" Daily Activity      ?Outcome Measure Help from another person eating meals?: A Lot ?Help from another person taking care of personal grooming?: A Lot ?Help from another person toileting, which includes using toliet, bedpan, or urinal?: A Lot ?Help from another person bathing (including washing, rinsing, drying)?: A Lot ?Help from another person to put on and taking off regular upper body clothing?: A Lot ?Help from another person to put on and taking off regular lower body clothing?: Total ?6 Click Score: 11 ?  ?End of Session Nurse Communication: Mobility status ? ?Activity Tolerance: Other (comment) (Limited by confusion) ?Patient left: in bed;with call bell/phone within reach;with bed alarm set;with nursing/sitter in room ? ?OT Visit Diagnosis: Unsteadiness on feet (R26.81);Other abnormalities of gait and mobility (R26.89);Muscle weakness (generalized) (M62.81)  ?              ?Time: 0092-3300 ?OT Time Calculation (min): 13 min ?Charges:  OT General Charges ?$OT Visit: 1 Visit ?OT  Evaluation ?$OT Eval Moderate Complexity: 1 Mod ? ?Ralonda Tartt MSOT, OTR/L ?Acute Rehab ?Pager: [phone] ?Office: [phone] ? ?Vidit Boissonneault M Estefano Victory ?02/12/2022, 2:16 PM ?

## 2022-02-13 DIAGNOSIS — H543 Unqualified visual loss, both eyes: Secondary | ICD-10-CM | POA: Diagnosis not present

## 2022-02-13 DIAGNOSIS — Z992 Dependence on renal dialysis: Secondary | ICD-10-CM

## 2022-02-13 DIAGNOSIS — E1122 Type 2 diabetes mellitus with diabetic chronic kidney disease: Secondary | ICD-10-CM | POA: Diagnosis not present

## 2022-02-13 DIAGNOSIS — R410 Disorientation, unspecified: Secondary | ICD-10-CM | POA: Diagnosis not present

## 2022-02-13 DIAGNOSIS — I160 Hypertensive urgency: Secondary | ICD-10-CM | POA: Diagnosis not present

## 2022-02-13 LAB — GLUCOSE, CAPILLARY
Glucose-Capillary: 116 mg/dL — ABNORMAL HIGH (ref 70–99)
Glucose-Capillary: 185 mg/dL — ABNORMAL HIGH (ref 70–99)
Glucose-Capillary: 183 mg/dL — ABNORMAL HIGH (ref 70–99)
Glucose-Capillary: 96 mg/dL (ref 70–99)

## 2022-02-13 MED ORDER — HYDRALAZINE HCL 50 MG PO TABS
50.0000 mg | ORAL_TABLET | Freq: Three times a day (TID) | ORAL | Status: DC
  Administered 2022-02-13 – 2022-02-14 (×2): 50 mg via ORAL
  Filled 2022-02-13 (×2): qty 1

## 2022-02-13 NOTE — TOC Initial Note (Signed)
Transition of Care (TOC) - Initial/Assessment Note  ? ? ?Patient Details  ?Name: Tammy Moses ?MRN: [id number] ?Date of Birth: [birth year] ? ?Transition of Care (TOC) CM/SW Contact:    ?Vinie Sill, LCSW ?Phone Number: ?02/13/2022, 11:48 AM ? ?Clinical Narrative:                 ? ?CSW met with patient at bedside. CSW introduced self and explained role. CSW discussed therapy recommendation of short term rehab at [facility]. Patient states she is agreeable to CSW sending out referrals for possible placement. She states she wants to know her placement options before she decides on going to SNF. Patient reports she lives in the home with her mother and her sister Benjamine Mola). CSW given permission to contact her mother and her niece.  ? ?CSW spoke with patient's niece,Joshana. She states the patient lives in the home with her and her mother (patient's mother/Dorothy). Her sister Benjamine Mola has been deceased since 2016/01/10. Jeralene Huff, states the patient is beginning to "say things she wouldn't normally say". Example used- saying she lives with her sister( who is deceased) or her father which is also deceased. CSW encourage her to discuss her concerns with the doctor. She states understanding.  ? ?Patient's niece is agreeable to short term rehab at [facility]. However, she states they have had some bad experiences "I really did not want to go back through the rehab thing". She reports we have a handicapped accessible home and things she needs in the home. CSW explained, limited SNF options because of patient's dialysis  needs but will send referrals and provide family with the SNF offers.  ? ?[facility] MWF  in [employer] -uses [employer] - chair time 11:10 am. ? ?TOC will provide bed offers once available ?TOC will continue to follow and assist with discharge planning. ? ?Thurmond Butts, MSW, LCSW ?Clinical Social Worker ? ? ? ? ?Expected Discharge Plan: [place] ?Barriers to Discharge: Psychologist, clinical, Continued Medical Work up ? ? ?Patient Goals and CMS Choice ?  ?  ?  ? ?Expected Discharge Plan and Services ?Expected Discharge Plan: [place] ?In-house Referral: Clinical Social Work ?  ?  ?  ?                ?  ?  ?  ?  ?  ?  ?  ?  ?  ?  ? ?Prior Living Arrangements/Services ?  ?Lives with:: Self, Relatives ?Patient language and need for interpreter reviewed:: No ?       ?Need for Family Participation in Patient Care: Yes (Comment) ?Care giver support system in place?: Yes (comment) ?  ?Criminal Activity/Legal Involvement Pertinent to Current Situation/Hospitalization: No - Comment as needed ? ?Activities of Daily Living ?Home Assistive Devices/Equipment: Wheelchair, CBG Meter, Blood pressure cuff ?ADL Screening (condition at time of admission) ?Patient's cognitive ability adequate to safely complete daily activities?: Yes ?Is the patient deaf or have difficulty hearing?: No ?Does the patient have difficulty seeing, even when wearing glasses/contacts?: Yes ?Does the patient have difficulty concentrating, remembering, or making decisions?: Yes ?Patient able to express need for assistance with ADLs?: Yes ?Does the patient have difficulty dressing or bathing?: No ?Independently performs ADLs?: Yes (appropriate for developmental age) ?Does the patient have difficulty walking or climbing stairs?: Yes ?Weakness of Legs: Right ?Weakness of Arms/Hands: None ? ?Permission Sought/Granted ?Permission sought to share information with : Family Supports ?Permission granted to share information with : Yes,  Verbal Permission Granted ? Share Information with NAME: Jesse Fall ? Permission granted to share info w AGENCY: SNFs ? Permission granted to share info w Relationship: niece ? Permission granted to share info w Contact Information: [phone] ? ?Emotional Assessment ?  ?  ?Affect (typically observed): Accepting, Pleasant ?Orientation: : Oriented to Self, Oriented to Place, Oriented to   Time, Oriented to Situation ?Alcohol / Substance Use: Not Applicable ?Psych Involvement: No (comment) ? ?Admission diagnosis:  Hypertension [I10] ?HTN (hypertension) [I10] ?Patient Active Problem List  ? Diagnosis Date Noted  ? 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 Acute metabolic encephalopathy 13/05/6577  ? Malnutrition of moderate degree 02/12/2022  ? Delirium 02/11/2022  ? Hypertensive urgency 02/06/2022  ? HTN (hypertension) 02/04/2022  ? S/P AKA (above knee amputation) unilateral, right ([place]) 12/22/2021  ? Complication of surgical procedure   ? Wound dehiscence 11/14/2021  ? Hyperlipidemia   ? Hepatitis C carrier ([place])   ? Diabetic neuropathy ([place])   ? Retinopathy, diabetic, bilateral ([place])   ? Acute delirium   ? Peripheral arterial disease ([place])   ? 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[place])   ? Physical deconditioning   ? Gangrene of foot ([place]) 10/23/2021  ? Patellar sleeve fracture of right knee, closed, initial encounter 10/21/2021  ? Closed patellar sleeve fracture of right knee 10/21/2021  ? Malignant hypertension 09/30/2021  ? Flash pulmonary edema ([place]) 09/30/2021  ? Non compliance with medical treatment 09/30/2021  ? Foot pain, right 09/22/2021  ? Elevated troponin 09/16/2021  ? Anemia due to chronic kidney disease 09/16/2021  ? Impaired functional mobility and activity tolerance 01/04/2019  ? Pneumonia of both lungs due to infectious organism 10/05/2017  ? Fall   ? Anemia in chronic kidney disease (CK[place]) 07/12/2017  ? COPD (chronic obstructive pulmonary disease) ([place]) 06/24/2017  ? ESRD (end stage renal disease) ([place]) -  Dialyzes at [facility].  On Monday Wednesday Friday. 06/17/2017  ? Foot drop 05/26/2017  ? Carpal tunnel syndrome of left wrist 05/20/2017  ? Ulnar neuropathy of left upper extremity 05/20/2017  ? Poor compliance with medication 05/17/2017  ? Peripheral neuropathy 05/10/2017  ? Controlled type 2 diabetes mellitus  with chronic kidney disease on chronic dialysis ([place]) 05/10/2017  ? 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  ? Vitamin D deficiency 04/07/2017  ? Absolute glaucoma of right eye 12/27/2016  ? Glaucoma (increased eye pressure) 12/27/2016  ? PCO (posterior capsule opacification), left 12/27/2016  ? Gait abnormality 03/07/2014  ? Abnormality of gait 03/07/2014  ? Hepatitis C virus infection without hepatic coma 03/04/2014  ? History of hepatitis C 03/04/2014  ? Erosive gastritis 02/14/2014  ? Pseudophakia of left eye 10/30/2012  ? Asthma 10/13/2012  ? Reflux 10/13/2012  ? Lens replaced by other means 10/12/2012  ? Primary open angle glaucoma 10/12/2012  ? BACK PAIN, LUMBAR 08/18/2010  ? IDDM 08/11/2010  ? HYPERCHOLESTEROLEMIA 08/11/2010  ? HYPOKALEMIA 08/11/2010  ? DEPRESSION 08/11/2010  ? GLAUCOMA 08/11/2010  ? Blindness 08/11/2010  ? Hypertension 08/11/2010  ? GERD 08/11/2010  ? CONSTIPATION 08/06/2009  ? ?PCP:  [name], FNP ?Pharmacy:   ?[facility]- [address] [place], [place] - [address] ?[address] ?[place] [address] ?Phone: [phone] Fax: [phone] ? ?[id number] - [place] [address] ?[address] ?[place] [address] ?Phone: [phone] Fax: [phone] ? ? ? ? ?  Social Determinants of Health (SDOH) Interventions ?  ? ?Readmission Risk Interventions ? ?  10/09/2021  ?  4:18 PM  ?Readmission Risk Prevention Plan  ?Transportation Screening Complete  ?[place] or Home Care Consult Complete  ?SW Recovery Care/Counseling Consult Complete  ?Palliative Care Screening Not Applicable  ?Skilled Nursing Facility Complete  ? ? ? ?

## 2022-02-13 NOTE — Progress Notes (Signed)
Patient IV came out and MD notified. MD is okay with patient not having an IV for now. All meds are PO currently. ?

## 2022-02-13 NOTE — [place] FL2 (Addendum)
?[place] MEDICAID FL2 LEVEL OF CARE SCREENING TOOL  ?  ? ?IDENTIFICATION  ?Patient Name: ?Tammy Moses Birthdate: 1966/01/10 Sex: female Admission Date (Current Location): ?02/04/2022  ?[place] and [place] Number: ? [place] ?  Facility and Address:  ?[facility] [place] [place] [address], [place], [place] [address] ?     Provider Number: ?[id number]  ?Attending Physician Name and Address:  ?Edwin Dada, * ? Relative Name and Phone Number:  ?  ?   ?Current Level of Care: ?Hospital Recommended Level of Care: ?[place] Prior Approval Number: ?  ? ?Date Approved/Denied: ?  PASRR Number: ?[id number] A ? ?Discharge Plan: ?SNF ?  ? ?Current Diagnoses: ?Patient Active Problem List  ? Diagnosis Date Noted  ? 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 Acute metabolic encephalopathy 61/60/7371  ? Malnutrition of moderate degree 02/12/2022  ? Delirium 02/11/2022  ? Hypertensive urgency 02/06/2022  ? HTN (hypertension) 02/04/2022  ? S/P AKA (above knee amputation) unilateral, right ([employer]) 12/22/2021  ? Complication of surgical procedure   ? Wound dehiscence 11/14/2021  ? Hyperlipidemia   ? Hepatitis C carrier ([place])   ? Diabetic neuropathy ([place])   ? Retinopathy, diabetic, bilateral ([place])   ? Acute delirium   ? Peripheral arterial disease ([place])   ? 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[place])   ? Physical deconditioning   ? Gangrene of foot ([place]) 10/23/2021  ? Patellar sleeve fracture of right knee, closed, initial encounter 10/21/2021  ? Closed patellar sleeve fracture of right knee 10/21/2021  ? Malignant hypertension 09/30/2021  ? Flash pulmonary edema ([place]) 09/30/2021  ? Non compliance with medical treatment 09/30/2021  ? Foot pain, right 09/22/2021  ? Elevated troponin 09/16/2021  ? Anemia due to chronic kidney disease 09/16/2021  ? Impaired functional mobility and activity tolerance 01/04/2019  ? Pneumonia of both lungs due  to infectious organism 10/05/2017  ? Fall   ? Anemia in chronic kidney disease (CKD) 07/12/2017  ? COPD (chronic obstructive pulmonary disease) ([place]) 06/24/2017  ? ESRD (end stage renal disease) ([place]) -  Dialyzes at [facility].  On Monday Wednesday Friday. 06/17/2017  ? Foot drop 05/26/2017  ? Carpal tunnel syndrome of left wrist 05/20/2017  ? Ulnar neuropathy of left upper extremity 05/20/2017  ? Poor compliance with medication 05/17/2017  ? Peripheral neuropathy 05/10/2017  ? Controlled type 2 diabetes mellitus with chronic kidney disease on chronic dialysis ([place]) 05/10/2017  ? 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  ? Vitamin D deficiency 04/07/2017  ? Absolute glaucoma of right eye 12/27/2016  ? Glaucoma (increased eye pressure) 12/27/2016  ? PCO (posterior capsule opacification), left 12/27/2016  ? Gait abnormality 03/07/2014  ? Abnormality of gait 03/07/2014  ? Hepatitis C virus infection without hepatic coma 03/04/2014  ? History of hepatitis C 03/04/2014  ? Erosive gastritis 02/14/2014  ? Pseudophakia of left eye 10/30/2012  ? Asthma 10/13/2012  ? Reflux 10/13/2012  ? Lens replaced by other means 10/12/2012  ? Primary open angle glaucoma 10/12/2012  ? BACK PAIN, LUMBAR 08/18/2010  ? IDDM 08/11/2010  ? HYPERCHOLESTEROLEMIA 08/11/2010  ? HYPOKALEMIA 08/11/2010  ? DEPRESSION 08/11/2010  ? GLAUCOMA 08/11/2010  ? Blindness 08/11/2010  ? Hypertension 08/11/2010  ? GERD 08/11/2010  ? CONSTIPATION 08/06/2009  ? ? ?Orientation RESPIRATION BLADDER Height & Weight   ?  ?Self, Place, Situation, Time ? Normal External catheter, Incontinent Weight: 100 lb 1.6 oz (45.4 kg) ?Height:  5\' 1"  (154.9 cm)  ?BEHAVIORAL SYMPTOMS/MOOD NEUROLOGICAL BOWEL NUTRITION STATUS  ?    Continent Diet (please see discharge summary)  ?AMBULATORY STATUS COMMUNICATION OF NEEDS Skin   ?Extensive Assist Verbally Surgical wounds (pressure injury Left Heel, closed incision Left Leg, wound incision diabetice ulcer toe, anterior Left, Broken skin  on big toe w/surrounding necrosis, 2nd great toe also necrotic) ?  ?  ?  ?    ?     ?     ? ? ?Personal Care Assistance Level of Assistance  ?Feeding, Dressing, Bathing Bathing Assistance: Maximum assistance ?Feeding assistance: Independent ?Dressing Assistance: Maximum assistance ?   ? ?Functional Limitations Info  ?Sight, Hearing, Speech Sight Info: Impaired (Blind) ?Hearing Info: Adequate ?Speech Info: Adequate  ? ? ?SPECIAL CARE FACTORS FREQUENCY  ?PT (By licensed PT), OT (By licensed OT)   ?  ?PT Frequency: 5x per week ?OT Frequency: 5x per week ?  ?  ?  ?   ? ? ?Contractures Contractures Info: Not present  ? ? ?Additional Factors Info  ?Code Status, Allergies Code Status Info: Full ?Allergies Info: Acyclovir And Related ?  ?  ?  ?   ? ?Current Medications (02/13/2022):  This is the current hospital active medication list ?Current Facility-Administered Medications  ?Medication Dose Route Frequency Provider Last Rate Last Admin  ? 0.9 %  sodium chloride infusion  500 mL Intravenous Once PRN Dagoberto Ligas, PA-C      ? acetaminophen (TYLENOL) tablet 325-650 mg  325-650 mg Oral Q4H PRN Dagoberto Ligas, PA-C      ? Or  ? acetaminophen (TYLENOL) suppository 325-650 mg  325-650 mg Rectal Q4H PRN Dagoberto Ligas, PA-C      ? albuterol (PROVENTIL) (2.5 MG/3ML) 0.083% nebulizer solution 3 mL  3 mL Inhalation Q4H PRN Dagoberto Ligas, PA-C      ? amLODipine (NORVASC) tablet 10 mg  10 mg Oral Daily Dagoberto Ligas, PA-C   10 mg at 02/13/22 1015  ? aspirin EC tablet 81 mg  81 mg Oral Daily Dagoberto Ligas, PA-C   81 mg at 02/13/22 1014  ? atorvastatin (LIPITOR) tablet 80 mg  80 mg Oral Daily Dagoberto Ligas, PA-C   80 mg at 02/13/22 1015  ? atropine 1 % ophthalmic solution 1 drop  1 drop Both Eyes BID Dagoberto Ligas, PA-C   1 drop at 02/13/22 1015  ? buPROPion [facility] SR) 12 hr tablet 150 mg  150 mg Oral Daily Dagoberto Ligas, PA-C   150 mg at 02/13/22 1015  ? Chlorhexidine Gluconate Cloth 2 % PADS 6 each  6  each Topical Q0600 Dagoberto Ligas, PA-C   6 each at 02/13/22 0947  ? clopidogrel (PLAVIX) tablet 75 mg  75 mg Oral Q0600 Dagoberto Ligas, PA-C   75 mg at 02/13/22 0962  ? dorzolamide-timolol (COSOPT) 22.3-6.8 MG/ML ophthalmic solution 1 drop  1 drop Both Eyes BID Dagoberto Ligas, PA-C   1 drop at 02/13/22 1015  ? feeding supplement (BOOST / RESOURCE BREEZE) liquid 1 Container  1 Container Oral BID BM Danford, Suann Larry, MD   1 Container at 02/13/22 1023  ? furosemide (LASIX) tablet 80 mg  80 mg Oral 2 times per day on Sun Tue Thu Sat Dagoberto Ligas, PA-C   80 mg at 02/13/22 8366  ? guaiFENesin-dextromethorphan (ROBITUSSIN DM) 100-10 MG/5ML syrup 15 mL  15 mL Oral Q4H PRN Dagoberto Ligas, PA-C      ? heparin injection 5,000 Units  5,000 Units Subcutaneous Q8H Dagoberto Ligas,  PA-C   5,000 Units at 02/13/22 2025  ? hydrALAZINE (APRESOLINE) injection 10 mg  10 mg Intravenous Q8H PRN Dagoberto Ligas, PA-C   10 mg at 02/05/22 1119  ? hydrALAZINE (APRESOLINE) tablet 150 mg  150 mg Oral Q8H Dagoberto Ligas, PA-C   150 mg at 02/13/22 4270  ? insulin aspart (novoLOG) injection 0-6 Units  0-6 Units Subcutaneous TID WC Dagoberto Ligas, PA-C   1 Units at 02/12/22 1328  ? labetalol (NORMODYNE) injection 10 mg  10 mg Intravenous Q2H PRN Dagoberto Ligas, PA-C   10 mg at 02/07/22 6237  ? latanoprost (XALATAN) 0.005 % ophthalmic solution 1 drop  1 drop Both Eyes QHS Dagoberto Ligas, PA-C   1 drop at 02/12/22 2221  ? losartan (COZAAR) tablet 100 mg  100 mg Oral Daily Dagoberto Ligas, PA-C   100 mg at 02/13/22 1014  ? metoCLOPramide (REGLAN) tablet 10 mg  10 mg Oral TID AC Dagoberto Ligas, PA-C   10 mg at 02/13/22 6283  ? metoprolol tartrate (LOPRESSOR) injection 2-5 mg  2-5 mg Intravenous Q2H PRN Dagoberto Ligas, PA-C      ? metoprolol tartrate (LOPRESSOR) tablet 50 mg  50 mg Oral BID Dagoberto Ligas, PA-C   50 mg at 02/13/22 1014  ? multivitamin (RENA-VIT) tablet 1 tablet  1 tablet Oral QHS Dagoberto Ligas,  PA-C   1 tablet at 02/12/22 2220  ? ondansetron (ZOFRAN) injection 4 mg  4 mg Intravenous Q6H PRN Dagoberto Ligas, PA-C      ? oxyCODONE-acetaminophen (PERCOCET/ROXICET) 5-325 MG per tablet 1-2 tablet  1-

## 2022-02-13 NOTE — Progress Notes (Signed)
Nephrology Follow-Up Consult note ? ? ?Assessment/Recommendations: Tammy Moses is a/an 56 y.o. female with a past medical history significant for ESRD on HD, PAD, DM2, HTN initially presented for outpatient elective wound debridement and angio however was found to have severe uncontrolled HTN.  ?  ?# ESRD:  ?-outpatient orders: [facility].  MWF.  [id number].  3.5 hours.  350/600.  2K, 2.5 Cal.  LUE AVF.  Heparin 1500 units bolus, 500 units maintenance.  Venofer 50 mg q. treatment, EPO 11,800 units, Sensipar 30 q. Mondays. Down to 43-43.8 kg here in the hospital. ?-Continue MWF schedule; tolerated 3L net UF on 4/17, 2.3 4/19, only -395ml on 4/21 ? ?We've reached her EDW and BP much better controlled as well. EDW likely ~44.5-45kg (don't know if I believe the 43-43.8kg) ? ?Next HD on Monday. No absolute indication for RRT and the patient appears to be  comfortable. ? ?  ?# Volume/ severe hypertension: EDW 44.5-45kg. We've reached EDW and she is no longer tolerating much UF ?-Continue labetalol and hydral, losartan, Lasix.  Increased  losartan to 100mg  daily. ?  ?# PAD s/p right AKA w/ nonhealing wound/gangrene, leukocytosis ?-recent rt AKA after unsuccessful revascularization (staples still in )l  ?- VVS POD3 left fem below knee pop bypass with ipsilateral non reversed GSV + left iliofemoral endarterectomy. ?-abx per primary service if indicated ?  ?# Anemia of Chronic Kidney Disease: Hemoglobin 10.3. Currently receives venofer, hold epo.  ?  ?# Secondary Hyperparathyroidism/Hyperphosphatemia: resumed home binders and sensipar p5.7 4/19 on sevelamer 3 tabs TIDM ?  ?# Vascular access: LUE BCF ? ?Possibly requiring placement at SNF ?  ?# Additional recommendations: ?- Dose all meds for creatinine clearance < 10 ml/min  ?- Unless absolutely necessary, no MRIs with gadolinium.  ?- Implement save arm precautions.  Prefer needle sticks in the dorsum of the hands or wrists.  No blood pressure measurements in left arm. ?-  If blood transfusion is requested during hemodialysis sessions, please alert [place] prior to the session.  ?- If a hemodialysis catheter line culture is requested, please alert [place] as only hemodialysis nurses are able to collect those specimens.  ? ? ? ?[name] W ?[facility] ?02/13/2022 ?7:56 AM ? ?___________________________________________________________ ? ?CC: ESRD ? ?Interval History/Subjective: Patient resting in bed pleasant but confused.  ? ?Medications:  ?Current Facility-Administered Medications  ?Medication Dose Route Frequency Provider Last Rate Last Admin  ? 0.9 %  sodium chloride infusion  500 mL Intravenous Once PRN Dagoberto Ligas, PA-C      ? acetaminophen (TYLENOL) tablet 325-650 mg  325-650 mg Oral Q4H PRN Dagoberto Ligas, PA-C      ? Or  ? acetaminophen (TYLENOL) suppository 325-650 mg  325-650 mg Rectal Q4H PRN Dagoberto Ligas, PA-C      ? albuterol (PROVENTIL) (2.5 MG/3ML) 0.083% nebulizer solution 3 mL  3 mL Inhalation Q4H PRN Dagoberto Ligas, PA-C      ? amLODipine (NORVASC) tablet 10 mg  10 mg Oral Daily Dagoberto Ligas, PA-C   10 mg at 02/11/22 0347  ? aspirin EC tablet 81 mg  81 mg Oral Daily Dagoberto Ligas, PA-C   81 mg at 02/12/22 1311  ? atorvastatin (LIPITOR) tablet 80 mg  80 mg Oral Daily Dagoberto Ligas, PA-C   80 mg at 02/12/22 1311  ? atropine 1 % ophthalmic solution 1 drop  1 drop Both Eyes BID Dagoberto Ligas, PA-C   1 drop at 02/12/22 2221  ? buPROPion [facility] SR) 12  hr tablet 150 mg  150 mg Oral Daily Dagoberto Ligas, PA-C   150 mg at 02/12/22 1311  ? Chlorhexidine Gluconate Cloth 2 % PADS 6 each  6 each Topical Q0600 Dagoberto Ligas, PA-C   6 each at 02/13/22 9562  ? clopidogrel (PLAVIX) tablet 75 mg  75 mg Oral Q0600 Dagoberto Ligas, PA-C   75 mg at 02/13/22 1308  ? dorzolamide-timolol (COSOPT) 22.3-6.8 MG/ML ophthalmic solution 1 drop  1 drop Both Eyes BID Dagoberto Ligas, PA-C   1 drop at 02/12/22 2221  ? feeding supplement (BOOST / RESOURCE  BREEZE) liquid 1 Container  1 Container Oral BID BM Danford, Suann Larry, MD   1 Container at 02/12/22 1422  ? furosemide (LASIX) tablet 80 mg  80 mg Oral 2 times per day on Sun Tue Thu Sat Dagoberto Ligas, PA-C      ? guaiFENesin-dextromethorphan (ROBITUSSIN DM) 100-10 MG/5ML syrup 15 mL  15 mL Oral Q4H PRN Dagoberto Ligas, PA-C      ? heparin injection 5,000 Units  5,000 Units Subcutaneous Q8H Dagoberto Ligas, PA-C   5,000 Units at 02/13/22 6578  ? hydrALAZINE (APRESOLINE) injection 10 mg  10 mg Intravenous Q8H PRN Dagoberto Ligas, PA-C   10 mg at 02/05/22 1119  ? hydrALAZINE (APRESOLINE) tablet 150 mg  150 mg Oral Q8H Dagoberto Ligas, PA-C   150 mg at 02/13/22 4696  ? insulin aspart (novoLOG) injection 0-6 Units  0-6 Units Subcutaneous TID WC Dagoberto Ligas, PA-C   1 Units at 02/12/22 1328  ? labetalol (NORMODYNE) injection 10 mg  10 mg Intravenous Q2H PRN Dagoberto Ligas, PA-C   10 mg at 02/07/22 2952  ? latanoprost (XALATAN) 0.005 % ophthalmic solution 1 drop  1 drop Both Eyes QHS Dagoberto Ligas, PA-C   1 drop at 02/12/22 2221  ? losartan (COZAAR) tablet 100 mg  100 mg Oral Daily Dagoberto Ligas, PA-C   100 mg at 02/12/22 1311  ? metoCLOPramide (REGLAN) tablet 10 mg  10 mg Oral TID AC Dagoberto Ligas, PA-C   10 mg at 02/12/22 1904  ? metoprolol tartrate (LOPRESSOR) injection 2-5 mg  2-5 mg Intravenous Q2H PRN Dagoberto Ligas, PA-C      ? metoprolol tartrate (LOPRESSOR) tablet 50 mg  50 mg Oral BID Dagoberto Ligas, PA-C   50 mg at 02/12/22 2220  ? multivitamin (RENA-VIT) tablet 1 tablet  1 tablet Oral QHS Dagoberto Ligas, PA-C   1 tablet at 02/12/22 2220  ? ondansetron (ZOFRAN) injection 4 mg  4 mg Intravenous Q6H PRN Dagoberto Ligas, PA-C      ? oxyCODONE-acetaminophen (PERCOCET/ROXICET) 5-325 MG per tablet 1-2 tablet  1-2 tablet Oral Q4H PRN Dagoberto Ligas, PA-C   2 tablet at 02/13/22 8413  ? pantoprazole (PROTONIX) EC tablet 40 mg  40 mg Oral Daily Dagoberto Ligas, PA-C   40 mg at  02/12/22 1311  ? phenol (CHLORASEPTIC) mouth spray 1 spray  1 spray Mouth/Throat PRN Dagoberto Ligas, PA-C      ? pregabalin (LYRICA) capsule 25 mg  25 mg Oral QHS Dagoberto Ligas, PA-C   25 mg at 02/12/22 2220  ? senna-docusate (Senokot-S) tablet 1 tablet  1 tablet Oral Daily PRN Dagoberto Ligas, PA-C      ? sevelamer carbonate (RENVELA) tablet 2,400 mg  2,400 mg Oral TID WC Dagoberto Ligas, PA-C   2,400 mg at 02/12/22 2002  ? sevelamer carbonate (RENVELA) tablet 800 mg  800 mg Oral PRN Dagoberto Ligas, PA-C   800 mg at 02/05/22 1635  ?  ? ? ?  Review of Systems: ?10 systems reviewed and negative except per interval history/subjective ? ?Physical Exam: ?Vitals:  ? 02/12/22 2344 02/13/22 0434  ?BP: (!) 127/38 (!) 127/39  ?Pulse: 70 74  ?Resp: 16 16  ?Temp: 99.4 ?F (37.4 ?C) 99.3 ?F (37.4 ?C)  ?SpO2: 97% 99%  ? ?No intake/output data recorded. ? ?Intake/Output Summary (Last 24 hours) at 02/13/2022 0756 ?Last data filed at 02/12/2022 2000 ?Gross per 24 hour  ?Intake 320 ml  ?Output 321 ml  ?Net -1 ml  ? ? ?Constitutional: Chronically ill-appearing, no distress ?ENMT: ears and nose without scars or lesions, MMM ?CV: normal rate, no audible rub ?Respiratory: Bilateral chest rise, normal work of breathing ?Gastrointestinal: soft, nondistended ?Psych: alert, appropriate mood and affect ?Access: LUA BCF, no infiltration ? ? ?Test Results ?I personally reviewed new and old clinical labs and radiology tests ?Lab Results  ?Component Value Date  ? NA 134 (L) 02/12/2022  ? K 5.5 (H) 02/12/2022  ? CL 96 (L) 02/12/2022  ? CO2 23 02/12/2022  ? BUN 59 (H) 02/12/2022  ? CREATININE 7.87 (H) 02/12/2022  ? CALCIUM 9.6 02/12/2022  ? ALBUMIN 3.0 (L) 02/10/2022  ? PHOS 5.7 (H) 02/10/2022  ? ? ?CBC ?Recent Labs  ?Lab 02/08/22 ?0428 02/09/22 ?0422 02/10/22 ?1601 02/11/22 ?0932 02/12/22 ?0123  ?WBC 10.8*   < > 10.3 11.3* 16.6*  ?NEUTROABS 6.5  --   --   --   --   ?HGB 10.4*   < > 11.4* 11.3* 10.3*  ?HCT 34.2*   < > 37.5 37.1 34.9*  ?MCV  83.8   < > 84.7 84.5 85.3  ?PLT 420*   < > 355 331 351  ? < > = values in this interval not displayed.  ? ? ? ? ? ?

## 2022-02-13 NOTE — Progress Notes (Signed)
?  Progress Note ? ? ?Patient: Tammy Moses:[id number] DOB: Dec 01, 1965 DOA: 02/04/2022     9 ?DOS: the patient was seen and examined on 02/13/2022 at 12:35PM ?  ? ? ? ?Brief hospital course: ?Tammy Moses is a 56 y.o. F with ESRD on HD, HTN, DM, PVD s/p R AKA 2 months ago, who presented with severe hypertension from preop. ? ?Evidently, patient had an AKA on right in Feb, was discharged from SNF with no pain medication, without having had her staples removed, with obvious new gangrene of her left great toe, and without a Vascular surgery follow up until April. At that appointment, the patient was scheduled urgently for debridement of mild dehiscence of the right AKA stump as well as LLE angiogram.    ? ?On the day of surgery, unfortunately, the patient's BP in preop was 867 mmHg systolic, she vomited and had chest pain, so surgery was deferred and she was admitted to the ICU for BP control on Cleviprex. ? ? ?4/13: Sent from preop for admission for severe HTN to ICU ?4/14: BP self corrected rapidly, put back on oral agents, transferred to floor ?4/17: Vascular surgery consulted ?4/19: Angiography completed ?4/20: left fem-pop Bypass by Dr. Virl Cagey ? ? ? ? ? ? ? 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 Virl Cagey ?-Continue aspirin, atorvastatin, Plavix ? ? ?Hypertensive urgency ?BP better with HD ?- Continue amlodipine, furosemide, metoprolol, losartan ?- Reduce hydralazine ? ?    ? ?Controlled type 2 diabetes mellitus with chronic kidney disease on chronic dialysis ([place]) ?Glucose normal ?-Continue SS corrections ?  ?Moderate protein calorie malnutrition ?As evidenced by chronic illness and severe muscle depletion and moderate fat depletion ? ? ? ? ? ? ? ? ?Subjective: Pain in her left leg is mild, well controlled with medication.  No fever, respiratory distress, no other complaints from patient. ? ? ? ? ?Physical  Exam: ?Vitals:  ? 02/13/22 1209 02/13/22 1423 02/13/22 1426 02/13/22 1450  ?BP: 133/62 (!) 132/44    ?Pulse: 73 89    ?Resp: 18 17    ?Temp: 98.1 ?F (36.7 ?C) 99 ?F (37.2 ?C)    ?TempSrc: Oral Oral    ?SpO2:  99% 99%   ?Weight:    46.6 kg  ?Height:      ? 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 ?Data Reviewed: ?Nephrology notes reviewed, vascular surgery notes reviewed, nursing notes reviewed, vital signs reviewed ?Glucose normal, no other new labs ? ?Family Communication:  ? ? ? ?Disposition: ?Status is: Inpatient ? ? ? ? ? ? ? ? ?Author: ?Edwin Dada, MD ?02/13/2022 2:53 PM ? ?For on call review [email or web address].  ? ? ?

## 2022-02-13 NOTE — Progress Notes (Addendum)
?  Progress Note ? ? ? ?02/13/2022 ?6:50 AM ?2 Days Post-Op ? ?Subjective:  no major complaints ? ? ?Vitals:  ? 02/12/22 2344 02/13/22 0434  ?BP: (!) 127/38 (!) 127/39  ?Pulse: 70 74  ?Resp: 16 16  ?Temp: 99.4 ?F (37.4 ?C) 99.3 ?F (37.4 ?C)  ?SpO2: 97% 99%  ? ?Physical Exam: ?Cardiac:  regular ?Lungs:  non labored ?Incisions:  left lower extremity incisions are clean, dry and intact without swelling or hematoma. ACE wrap reapplied ?Extremities:  Doppler Dp and Peroneal signals. Left foot warm. Dry gangrene of 1st and 2nd toes stable ?Neurologic: alert ? ?CBC ?   ?Component Value Date/Time  ? WBC 16.6 (H) 02/12/2022 0123  ? RBC 4.09 02/12/2022 0123  ? HGB 10.3 (L) 02/12/2022 0123  ? HGB 12.4 12/13/2017 1416  ? HCT 34.9 (L) 02/12/2022 0123  ? HCT 37.8 12/13/2017 1416  ? PLT 351 02/12/2022 0123  ? PLT 254 12/13/2017 1416  ? MCV 85.3 02/12/2022 0123  ? MCV 83 12/13/2017 1416  ? MCH 25.2 (L) 02/12/2022 0123  ? MCHC 29.5 (L) 02/12/2022 0123  ? RDW 19.9 (H) 02/12/2022 0123  ? RDW 15.8 (H) 12/13/2017 1416  ? LYMPHSABS 2.0 02/08/2022 0428  ? LYMPHSABS 4.1 (H) 12/13/2017 1416  ? MONOABS 1.9 (H) 02/08/2022 0428  ? EOSABS 0.4 02/08/2022 0428  ? EOSABS 0.3 12/13/2017 1416  ? BASOSABS 0.1 02/08/2022 0428  ? BASOSABS 0.1 12/13/2017 1416  ? ? ?BMET ?   ?Component Value Date/Time  ? NA 134 (L) 02/12/2022 0123  ? NA 146 (H) 05/10/2017 0950  ? K 5.5 (H) 02/12/2022 0123  ? CL 96 (L) 02/12/2022 0123  ? CO2 23 02/12/2022 0123  ? GLUCOSE 132 (H) 02/12/2022 0123  ? BUN 59 (H) 02/12/2022 0123  ? BUN 38 (H) 05/10/2017 0950  ? CREATININE 7.87 (H) 02/12/2022 0123  ? CREATININE 3.40 (H) 03/11/2017 1505  ? CALCIUM 9.6 02/12/2022 0123  ? GFRNONAA 6 (L) 02/12/2022 0123  ? GFRNONAA 15 (L) 03/11/2017 1505  ? GFRAA 5 (L) 05/05/2020 1314  ? GFRAA 17 (L) 03/11/2017 1505  ? ? ?INR ?   ?Component Value Date/Time  ? INR 1.2 10/21/2021 1425  ? ? ? ?Intake/Output Summary (Last 24 hours) at 02/13/2022 0650 ?Last data filed at 02/12/2022 1800 ?Gross per 24 hour   ?Intake 200 ml  ?Output 321 ml  ?Net -121 ml  ? ? ? ?Assessment/Plan:  56 y.o. female is s/p  left iliofemoral endarterectomy, left femoral to below-knee popliteal artery bypass with ipsilateral non-reversed GSV  2 Days Post-Op . Additionally has slow to heal right AKA ? ?LLE well perfused and warm with brisk Peroneal signal, monophasic DP ?Left Great toe/2nd toe gangrene stable ?Incisions are clean, dry and intact ?Dressings taken down and ACE reapplied ?Right AKA stable ?PT/OT recommending SNF ?Continue to work with PT/OT ?Medical management per primary ? ?DVT prophylaxis:  sq heparin ? ? ?Karoline Caldwell, PA-C ?[facility] ?[phone] ?02/13/2022 ?6:50 AM ? ?VASCULAR STAFF ADDENDUM: ?I have independently interviewed and examined the patient. ?I agree with the above.  ? ?Yevonne Aline. Stanford Breed, MD ?[facility] [place] ?Office Phone Number: [phone] ?02/13/2022 1:13 PM ? ? ?

## 2022-02-14 DIAGNOSIS — I150 Renovascular hypertension: Secondary | ICD-10-CM | POA: Diagnosis not present

## 2022-02-14 DIAGNOSIS — E1122 Type 2 diabetes mellitus with diabetic chronic kidney disease: Secondary | ICD-10-CM | POA: Diagnosis not present

## 2022-02-14 DIAGNOSIS — I70462 Atherosclerosis of autologous vein bypass graft(s) of the extremities with gangrene, left leg: Secondary | ICD-10-CM | POA: Diagnosis not present

## 2022-02-14 DIAGNOSIS — G9341 Metabolic encephalopathy: Secondary | ICD-10-CM | POA: Diagnosis not present

## 2022-02-14 LAB — GLUCOSE, CAPILLARY
Glucose-Capillary: 129 mg/dL — ABNORMAL HIGH (ref 70–99)
Glucose-Capillary: 122 mg/dL — ABNORMAL HIGH (ref 70–99)
Glucose-Capillary: 203 mg/dL — ABNORMAL HIGH (ref 70–99)
Glucose-Capillary: 92 mg/dL (ref 70–99)

## 2022-02-14 MED ORDER — HYDRALAZINE HCL 50 MG PO TABS
100.0000 mg | ORAL_TABLET | Freq: Three times a day (TID) | ORAL | Status: DC
  Administered 2022-02-14 – 2022-02-19 (×14): 100 mg via ORAL
  Filled 2022-02-14 (×14): qty 2

## 2022-02-14 NOTE — Progress Notes (Addendum)
?  Progress Note ? ? ? ?02/14/2022 ?8:05 AM ?3 Days Post-Op ? ?Subjective: asking about pizza pockets in the microwave ? ? ?Vitals:  ? 02/13/22 2332 02/14/22 0414  ?BP: (!) 173/63 (!) 166/41  ?Pulse: 75 70  ?Resp: 17 18  ?Temp: 97.9 ?F (36.6 ?C)   ?SpO2: 95% 98%  ? ?Physical Exam: ?Cardiac:  regular ?Lungs:  non labored ?Incisions:  left groin, left leg incisions are clean, dry and intact. No swelling or hematoma ?Extremities:  Left leg well perfused and warm. Doppler peroneal signal ?Abdomen:  flat, non distended ?Neurologic: alert and oriented  ? ?CBC ?   ?Component Value Date/Time  ? WBC 16.6 (H) 02/12/2022 0123  ? RBC 4.09 02/12/2022 0123  ? HGB 10.3 (L) 02/12/2022 0123  ? HGB 12.4 12/13/2017 1416  ? HCT 34.9 (L) 02/12/2022 0123  ? HCT 37.8 12/13/2017 1416  ? PLT 351 02/12/2022 0123  ? PLT 254 12/13/2017 1416  ? MCV 85.3 02/12/2022 0123  ? MCV 83 12/13/2017 1416  ? MCH 25.2 (L) 02/12/2022 0123  ? MCHC 29.5 (L) 02/12/2022 0123  ? RDW 19.9 (H) 02/12/2022 0123  ? RDW 15.8 (H) 12/13/2017 1416  ? LYMPHSABS 2.0 02/08/2022 0428  ? LYMPHSABS 4.1 (H) 12/13/2017 1416  ? MONOABS 1.9 (H) 02/08/2022 0428  ? EOSABS 0.4 02/08/2022 0428  ? EOSABS 0.3 12/13/2017 1416  ? BASOSABS 0.1 02/08/2022 0428  ? BASOSABS 0.1 12/13/2017 1416  ? ? ?BMET ?   ?Component Value Date/Time  ? NA 134 (L) 02/12/2022 0123  ? NA 146 (H) 05/10/2017 0950  ? K 5.5 (H) 02/12/2022 0123  ? CL 96 (L) 02/12/2022 0123  ? CO2 23 02/12/2022 0123  ? GLUCOSE 132 (H) 02/12/2022 0123  ? BUN 59 (H) 02/12/2022 0123  ? BUN 38 (H) 05/10/2017 0950  ? CREATININE 7.87 (H) 02/12/2022 0123  ? CREATININE 3.40 (H) 03/11/2017 1505  ? CALCIUM 9.6 02/12/2022 0123  ? GFRNONAA 6 (L) 02/12/2022 0123  ? GFRNONAA 15 (L) 03/11/2017 1505  ? GFRAA 5 (L) 05/05/2020 1314  ? GFRAA 17 (L) 03/11/2017 1505  ? ? ?INR ?   ?Component Value Date/Time  ? INR 1.2 10/21/2021 1425  ? ? ? ?Intake/Output Summary (Last 24 hours) at 02/14/2022 0805 ?Last data filed at 02/13/2022 2113 ?Gross per 24 hour   ?Intake 200 ml  ?Output --  ?Net 200 ml  ? ? ? ?Assessment/Plan:  56 y.o. female is s/p left iliofemoral endarterectomy, left femoral to below-knee popliteal artery bypass with ipsilateral non-reversed GSV    ? ?Left leg remains well perfused and warm with brisk peroneal signal ?Left great toe and 2nd toe dry gangrene unchanged ?Left groin and leg incisions are c/d/I without swelling or hematoma ?Left Leg ACE reapplied ?Right AKA stable  ?Continue PT/OT ?Pending SNF placement ? ?Karoline Caldwell, PA-C ?[facility] ?[phone] ?02/14/2022 ?8:05 AM ? ?VASCULAR STAFF ADDENDUM: ?I have independently interviewed and examined the patient. ?I agree with the above.  ? ?Yevonne Aline. Stanford Breed, MD ?[facility] [place] ?Office Phone Number: [phone] ?02/14/2022 11:04 AM ? ? ?

## 2022-02-14 NOTE — TOC Progression Note (Signed)
Transition of Care (TOC) - Progression Note  ? ? ?Patient Details  ?Name: Tammy Moses ?MRN: [id number] ?Date of Birth: 1966-06-05 ? ?Transition of Care (TOC) CM/SW Contact  ?Vinie Sill, LCSW ?Phone Number: ?02/14/2022, 9:40 AM ? ?Clinical Narrative:    ? ?Patient has no bed offers at this time. ? ?TOC will continue to follow and assist with discharge planning. ? ?Expected Discharge Plan: [place] ?Barriers to Discharge: Ship broker, Continued Medical Work up ? ?Expected Discharge Plan and Services ?Expected Discharge Plan: [place] ?In-house Referral: Clinical Social Work ?  ?  ?  ?                ?  ?  ?  ?  ?  ?  ?  ?  ?  ?  ? ? ?Social Determinants of Health (SDOH) Interventions ?  ? ?Readmission Risk Interventions ? ?  10/09/2021  ?  4:18 PM  ?Readmission Risk Prevention Plan  ?Transportation Screening Complete  ?[place] or Home Care Consult Complete  ?SW Recovery Care/Counseling Consult Complete  ?Palliative Care Screening Not Applicable  ?Skilled Nursing Facility Complete  ? ? ?

## 2022-02-14 NOTE — Progress Notes (Signed)
Nephrology Follow-Up Consult note ? ? ?Assessment/Recommendations: Tammy Moses is a/an 56 y.o. female with a past medical history significant for ESRD on HD, PAD, DM2, HTN initially presented for outpatient elective wound debridement and angio however was found to have severe uncontrolled HTN.  ?  ?# ESRD:  ?-outpatient orders: [facility].  MWF.  [id number].  3.5 hours.  350/600.  2K, 2.5 Cal.  LUE AVF.  Heparin 1500 units bolus, 500 units maintenance.  Venofer 50 mg q. treatment, EPO 11,800 units, Sensipar 30 q. Mondays. Down to 43-43.8 kg here in the hospital. ?-Continue MWF schedule; tolerated 3L net UF on 4/17, 2.3 4/19, only -357ml on 4/21 ? ?We've reached her EDW and BP much better controlled as well. EDW likely ~44.5-45kg (don't know if I believe the 43-43.8kg) ? ?Next HD on Monday. No absolute indication for RRT and the patient appears to be  comfortable. ?  ?# Volume/ severe hypertension: EDW 44.5-45kg. We've reached EDW (on Fri) and she is no longer tolerating much UF ?-Continue labetalol and hydral, losartan, Lasix.  losartan to 100mg  daily -> hydral being titrated down. ?  ?# PAD s/p right AKA w/ nonhealing wound/gangrene, leukocytosis ?-recent rt AKA after unsuccessful revascularization (staples still in )l  ?- VVS POD4 left fem below knee pop bypass with ipsilateral non reversed GSV + left iliofemoral endarterectomy. ?-abx per primary service if indicated ?  ?# Anemia of Chronic Kidney Disease: Hemoglobin 10.3. Currently receives venofer, hold epo.  ?  ?# Secondary Hyperparathyroidism/Hyperphosphatemia: resumed home binders and sensipar p5.7 4/19 on sevelamer 3 tabs TIDM ?  ?# Vascular access: LUE BCF ? ?Possibly requiring placement at SNF ?  ?# Additional recommendations: ?- Dose all meds for creatinine clearance < 10 ml/min  ?- Unless absolutely necessary, no MRIs with gadolinium.  ?- Implement save arm precautions.  Prefer needle sticks in the dorsum of the hands or wrists.  No blood pressure  measurements in left arm. ?- If blood transfusion is requested during hemodialysis sessions, please alert [place] prior to the session.  ? ? ?Otelia Santee W ?[facility] ?02/14/2022 ?8:45 AM ? ?___________________________________________________________ ? ?CC: ESRD ? ?Interval History/Subjective: Patient resting in bed pleasant but confused.  ? ?Medications:  ?Current Facility-Administered Medications  ?Medication Dose Route Frequency Provider Last Rate Last Admin  ? 0.9 %  sodium chloride infusion  500 mL Intravenous Once PRN Dagoberto Ligas, PA-C      ? acetaminophen (TYLENOL) tablet 325-650 mg  325-650 mg Oral Q4H PRN Dagoberto Ligas, PA-C   650 mg at 02/13/22 1431  ? Or  ? acetaminophen (TYLENOL) suppository 325-650 mg  325-650 mg Rectal Q4H PRN Dagoberto Ligas, PA-C      ? albuterol (PROVENTIL) (2.5 MG/3ML) 0.083% nebulizer solution 3 mL  3 mL Inhalation Q4H PRN Dagoberto Ligas, PA-C      ? amLODipine (NORVASC) tablet 10 mg  10 mg Oral Daily Dagoberto Ligas, PA-C   10 mg at 02/13/22 1015  ? aspirin EC tablet 81 mg  81 mg Oral Daily Dagoberto Ligas, PA-C   81 mg at 02/13/22 1014  ? atorvastatin (LIPITOR) tablet 80 mg  80 mg Oral Daily Dagoberto Ligas, PA-C   80 mg at 02/13/22 1015  ? atropine 1 % ophthalmic solution 1 drop  1 drop Both Eyes BID Dagoberto Ligas, PA-C   1 drop at 02/13/22 2111  ? buPROPion (WELLBUTRIN SR) 12 hr tablet 150 mg  150 mg Oral Daily Dagoberto Ligas, PA-C   150 mg at  02/13/22 1015  ? Chlorhexidine Gluconate Cloth 2 % PADS 6 each  6 each Topical Q0600 Dagoberto Ligas, PA-C   6 each at 02/14/22 0547  ? clopidogrel (PLAVIX) tablet 75 mg  75 mg Oral Q0600 Dagoberto Ligas, PA-C   75 mg at 02/14/22 0547  ? dorzolamide-timolol (COSOPT) 22.3-6.8 MG/ML ophthalmic solution 1 drop  1 drop Both Eyes BID Dagoberto Ligas, PA-C   1 drop at 02/13/22 2111  ? feeding supplement (BOOST / RESOURCE BREEZE) liquid 1 Container  1 Container Oral BID BM Danford, Suann Larry, MD   1 Container  at 02/13/22 1446  ? furosemide (LASIX) tablet 80 mg  80 mg Oral 2 times per day on Sun Tue Thu Sat Dagoberto Ligas, PA-C   80 mg at 02/13/22 2010  ? guaiFENesin-dextromethorphan (ROBITUSSIN DM) 100-10 MG/5ML syrup 15 mL  15 mL Oral Q4H PRN Dagoberto Ligas, PA-C      ? heparin injection 5,000 Units  5,000 Units Subcutaneous Q8H Dagoberto Ligas, PA-C   5,000 Units at 02/14/22 0626  ? hydrALAZINE (APRESOLINE) injection 10 mg  10 mg Intravenous Q8H PRN Dagoberto Ligas, PA-C   10 mg at 02/05/22 1119  ? hydrALAZINE (APRESOLINE) tablet 100 mg  100 mg Oral Q8H Danford, Suann Larry, MD      ? insulin aspart (novoLOG) injection 0-6 Units  0-6 Units Subcutaneous TID WC Dagoberto Ligas, PA-C   1 Units at 02/13/22 1823  ? labetalol (NORMODYNE) injection 10 mg  10 mg Intravenous Q2H PRN Dagoberto Ligas, PA-C   10 mg at 02/07/22 9485  ? latanoprost (XALATAN) 0.005 % ophthalmic solution 1 drop  1 drop Both Eyes QHS Dagoberto Ligas, PA-C   1 drop at 02/13/22 2118  ? losartan (COZAAR) tablet 100 mg  100 mg Oral Daily Dagoberto Ligas, PA-C   100 mg at 02/13/22 1014  ? metoCLOPramide (REGLAN) tablet 10 mg  10 mg Oral TID AC Dagoberto Ligas, PA-C   10 mg at 02/13/22 1800  ? metoprolol tartrate (LOPRESSOR) injection 2-5 mg  2-5 mg Intravenous Q2H PRN Dagoberto Ligas, PA-C      ? metoprolol tartrate (LOPRESSOR) tablet 50 mg  50 mg Oral BID Dagoberto Ligas, PA-C   50 mg at 02/13/22 2112  ? multivitamin (RENA-VIT) tablet 1 tablet  1 tablet Oral QHS Dagoberto Ligas, PA-C   1 tablet at 02/13/22 2112  ? ondansetron (ZOFRAN) injection 4 mg  4 mg Intravenous Q6H PRN Dagoberto Ligas, PA-C      ? oxyCODONE-acetaminophen (PERCOCET/ROXICET) 5-325 MG per tablet 1-2 tablet  1-2 tablet Oral Q4H PRN Dagoberto Ligas, PA-C   1 tablet at 02/13/22 2114  ? pantoprazole (PROTONIX) EC tablet 40 mg  40 mg Oral Daily Dagoberto Ligas, PA-C   40 mg at 02/13/22 1015  ? phenol (CHLORASEPTIC) mouth spray 1 spray  1 spray Mouth/Throat PRN Dagoberto Ligas, PA-C      ? pregabalin (LYRICA) capsule 25 mg  25 mg Oral QHS Dagoberto Ligas, PA-C   25 mg at 02/13/22 2113  ? senna-docusate (Senokot-S) tablet 1 tablet  1 tablet Oral Daily PRN Dagoberto Ligas, PA-C   1 tablet at 02/13/22 2114  ? sevelamer carbonate (RENVELA) tablet 2,400 mg  2,400 mg Oral TID WC Dagoberto Ligas, PA-C   2,400 mg at 02/13/22 1800  ? sevelamer carbonate (RENVELA) tablet 800 mg  800 mg Oral PRN Dagoberto Ligas, PA-C   800 mg at 02/05/22 1635  ?  ? ? ?Review of Systems: ?10 systems reviewed and negative  except per interval history/subjective ? ?Physical Exam: ?Vitals:  ? 02/13/22 2332 02/14/22 0414  ?BP: (!) 173/63 (!) 166/41  ?Pulse: 75 70  ?Resp: 17 18  ?Temp: 97.9 ?F (36.6 ?C)   ?SpO2: 95% 98%  ? ?No intake/output data recorded. ? ?Intake/Output Summary (Last 24 hours) at 02/14/2022 0845 ?Last data filed at 02/13/2022 2113 ?Gross per 24 hour  ?Intake 200 ml  ?Output --  ?Net 200 ml  ? ? ?Constitutional: Chronically ill-appearing, no distress ?ENMT: ears and nose without scars or lesions, MMM ?CV: normal rate, no audible rub ?Respiratory: Bilateral chest rise, normal work of breathing ?Gastrointestinal: soft, nondistended ?Psych: alert, appropriate mood and affect ?Access: LUA BCF, no infiltration ? ? ?Test Results ?I personally reviewed new and old clinical labs and radiology tests ?Lab Results  ?Component Value Date  ? NA 134 (L) 02/12/2022  ? K 5.5 (H) 02/12/2022  ? CL 96 (L) 02/12/2022  ? CO2 23 02/12/2022  ? BUN 59 (H) 02/12/2022  ? CREATININE 7.87 (H) 02/12/2022  ? CALCIUM 9.6 02/12/2022  ? ALBUMIN 3.0 (L) 02/10/2022  ? PHOS 5.7 (H) 02/10/2022  ? ? ?CBC ?Recent Labs  ?Lab 02/08/22 ?0428 02/09/22 ?0422 02/10/22 ?2423 02/11/22 ?5361 02/12/22 ?0123  ?WBC 10.8*   < > 10.3 11.3* 16.6*  ?NEUTROABS 6.5  --   --   --   --   ?HGB 10.4*   < > 11.4* 11.3* 10.3*  ?HCT 34.2*   < > 37.5 37.1 34.9*  ?MCV 83.8   < > 84.7 84.5 85.3  ?PLT 420*   < > 355 331 351  ? < > = values in this interval not  displayed.  ? ? ? ? ? ?

## 2022-02-14 NOTE — Progress Notes (Signed)
?  Progress Note ? ? ?Patient: Tammy Moses HWE:[id number] DOB: June 13, 1966 DOA: 02/04/2022     10 ?DOS: the patient was seen and examined on 02/14/2022 at 12:35PM ?  ? ? ? ?Brief hospital course: ?Mrs. Kreis is a 56 y.o. F with ESRD on HD, HTN, DM, PVD s/p R AKA 2 months ago, who presented with severe hypertension from preop. ? ?See prior summary ? ? ? ? ? ? ? ?Assessment and Plan: ?* PVD ?S/p fem-pop bypass 4/20 by Dr. Virl Cagey ?- Continue aspirin, statin, Plavix ? ? ?Hypertension ?BP elevated ?- Continu amlodipine, furosemide, metoprolol, losartan ?- Increase hydralazine    ? ?Diabetes ?Glucose controlled ?- Continue sliding scale correction ?   ? ? ? ? ? ? ? ? ?Subjective: Patient has no complaints, no swelling, no chest pain, no dyspnea, no confusion. ? ? ? ? ?Physical Exam: ?Vitals:  ? 02/14/22 0850 02/14/22 1204 02/14/22 1404 02/14/22 1405  ?BP: (!) 155/46 (!) 145/60  (!) 161/43  ?Pulse: 70 80    ?Resp: 17 17 16    ?Temp: 98.3 ?F (36.8 ?C) 98.4 ?F (36.9 ?C) 98.4 ?F (36.9 ?C)   ?TempSrc: Oral Oral Oral   ?SpO2: 96% 100%    ?Weight:      ?Height:      ? ?Thin elderly adult female, lying in bed, sleepy ?Hard to tell if she is disoriented or not, some of her answers are sensible and some of them are confused however she is hard of hearing and blind ?Abdomen soft without tenderness palpation or guarding, moves upper extremities with generalized weakness but symmetric strength, left lower extremity has no swelling, no dehiscence ? ?Data Reviewed: ?Glucose normal, no other lab ? ?Family Communication:  ? ? ? ?Disposition: ?Status is: Inpatient ?Patient was admitted for hypertensive urgency. ? ?While she was here she had femoropopliteal bypass on the left. ? ?She is now improving, medically stable for discharge to SNF whenever bed is available ? ? ? ? ? ? ? ?Author: ?Edwin Dada, MD ?02/14/2022 4:09 PM ? ?For on call review [email or web address]  ? ? ?

## 2022-02-15 DIAGNOSIS — E1122 Type 2 diabetes mellitus with diabetic chronic kidney disease: Secondary | ICD-10-CM | POA: Diagnosis not present

## 2022-02-15 DIAGNOSIS — E871 Hypo-osmolality and hyponatremia: Secondary | ICD-10-CM

## 2022-02-15 DIAGNOSIS — I160 Hypertensive urgency: Secondary | ICD-10-CM | POA: Diagnosis not present

## 2022-02-15 DIAGNOSIS — R410 Disorientation, unspecified: Secondary | ICD-10-CM | POA: Diagnosis not present

## 2022-02-15 DIAGNOSIS — H543 Unqualified visual loss, both eyes: Secondary | ICD-10-CM | POA: Diagnosis not present

## 2022-02-15 LAB — RENAL FUNCTION PANEL
Albumin: 2.3 g/dL — ABNORMAL LOW (ref 3.5–5.0)
Anion gap: 16 — ABNORMAL HIGH (ref 5–15)
BUN: 69 mg/dL — ABNORMAL HIGH (ref 6–20)
CO2: 22 mmol/L (ref 22–32)
Calcium: 9.8 mg/dL (ref 8.9–10.3)
Chloride: 91 mmol/L — ABNORMAL LOW (ref 98–111)
Creatinine, Ser: 9.53 mg/dL — ABNORMAL HIGH (ref 0.44–1.00)
GFR, Estimated: 4 mL/min — ABNORMAL LOW (ref >60)
Glucose, Bld: 148 mg/dL — ABNORMAL HIGH (ref 70–99)
Phosphorus: 6.4 mg/dL — ABNORMAL HIGH (ref 2.5–4.6)
Potassium: 4.4 mmol/L (ref 3.5–5.1)
Sodium: 129 mmol/L — ABNORMAL LOW (ref 135–145)

## 2022-02-15 LAB — IRON AND TIBC
Iron: 33 ug/dL (ref 28–170)
Saturation Ratios: 20 % (ref 10.4–31.8)
TIBC: 164 ug/dL — ABNORMAL LOW (ref 250–450)
UIBC: 131 ug/dL

## 2022-02-15 LAB — CBC
HCT: 24.6 % — ABNORMAL LOW (ref 36.0–46.0)
Hemoglobin: 7.7 g/dL — ABNORMAL LOW (ref 12.0–15.0)
MCH: 25.9 pg — ABNORMAL LOW (ref 26.0–34.0)
MCHC: 31.3 g/dL (ref 30.0–36.0)
MCV: 82.8 fL (ref 80.0–100.0)
Platelets: 393 10*3/uL (ref 150–400)
RBC: 2.97 MIL/uL — ABNORMAL LOW (ref 3.87–5.11)
RDW: 19.6 % — ABNORMAL HIGH (ref 11.5–15.5)
WBC: 12.3 10*3/uL — ABNORMAL HIGH (ref 4.0–10.5)
nRBC: 0 % (ref 0.0–0.2)

## 2022-02-15 LAB — GLUCOSE, CAPILLARY
Glucose-Capillary: 194 mg/dL — ABNORMAL HIGH (ref 70–99)
Glucose-Capillary: 128 mg/dL — ABNORMAL HIGH (ref 70–99)
Glucose-Capillary: 147 mg/dL — ABNORMAL HIGH (ref 70–99)
Glucose-Capillary: 179 mg/dL — ABNORMAL HIGH (ref 70–99)
Glucose-Capillary: 142 mg/dL — ABNORMAL HIGH (ref 70–99)

## 2022-02-15 LAB — PREPARE RBC (CROSSMATCH)

## 2022-02-15 LAB — FERRITIN: Ferritin: 1966 ng/mL — ABNORMAL HIGH (ref 11–307)

## 2022-02-15 MED ORDER — DARBEPOETIN ALFA 60 MCG/0.3ML IJ SOSY
60.0000 ug | PREFILLED_SYRINGE | INTRAMUSCULAR | Status: DC
  Administered 2022-02-15: 60 ug via INTRAVENOUS
  Filled 2022-02-15 (×2): qty 0.3

## 2022-02-15 MED ORDER — SODIUM CHLORIDE 0.9% IV SOLUTION: Freq: Once | INTRAVENOUS | Status: DC

## 2022-02-15 NOTE — Assessment & Plan Note (Deleted)
Na 129 today ?- Volume management with HD ?

## 2022-02-15 NOTE — Progress Notes (Signed)
removed 550 mls net fluid unable to remove more due to symptomatic hypotensoin pt became confused hollering rolling over.  pre bp 193/56 post bp 171/54 pre weight 45.6 post weight 45.0kg bed scales.  2 bandages to lua avg no bleeding dressing cdi. gave aranesp as ordered/ ?

## 2022-02-15 NOTE — Progress Notes (Signed)
?Progress Note ? ? ?Patient: Tammy Moses MOQ:[id number] DOB: May 18, 1966 DOA: 02/04/2022     11 ?DOS: the patient was seen and examined on 02/15/2022 at 10:15A ?  ? ? ? ?Brief hospital course: ?Mrs. Tammy Moses is a 56 y.o. F with ESRD on HD, HTN, DM, PVD s/p R AKA 2 months ago, who presented with severe hypertension from preop. ? ?Evidently, patient had an AKA on right in Feb, was discharged from SNF with no pain medication, without having had her staples removed, with obvious new gangrene of her left great toe, and without a Vascular surgery follow up until April. At that appointment, the patient was scheduled urgently for debridement of mild dehiscence of the right AKA stump as well as LLE angiogram.    ? ?On the day of surgery, unfortunately, the patient's BP in preop was 354 mmHg systolic, she vomited and had chest pain, so surgery was deferred and she was admitted to the ICU for BP control on Cleviprex. ? ? ?4/13: Sent from preop for admission for severe HTN to ICU ?4/14: BP self corrected rapidly, put back on oral agents, transferred to floor ?4/17: Vascular surgery consulted ?4/19: Angiography completed ?4/20: left fem-pop Bypass by Dr. Virl Moses ?4/24: Transfused 1 unit PRBCs ? ? ? ? ? ? 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 Virl Moses ?- Consult vascular surgery, appreciate cares ? ?-Continue aspirin, atorvastatin, Plavix ? ? ?Hypertensive urgency ?Still labile. ? ?BP >170 prior to HD.  With ultrafiltration, BP dropped to 60s patient became uncomfortable, UF reduced. ?-Continue amlodipine furosemide, metoprolol, hydralazine and losartan ? ?Anemia of chronic kidney disease ?No clinical bleeding.  However, Hgb down to 7.7 today from baseline around 10 g/dL. ?- Transfuse 1 unit ? ? ?Hyponatremia ?Na 129 today ?- Volume management with HD ? ? ? ?Malnutrition of moderate degree ?As evidenced by moderate loss of  subcutaneous fat and severe loss of subcutaneous muscle mass in the setting of end-stage renal disease ? ? ? ?Delirium ?Acute metabolic encephalopathy ruled out ?Patient's family reported concerns of intermittent confusion at home for several weeks prior to admission. ? ?Here, she has demonstrated the same, occasional, mostly at night, disorientation and agitation consistent with delirium ?- Consult Palliative Care ?Delirium precautions:  ? -Lights and TV off, minimize interruptions at night ? -Blinds open and lights on during day ? -Frequent reorientation ? -PT/OT as able ? -Avoid sedation medications/Beers list medications ?  ? ? ?S/P AKA (above knee amputation) unilateral, right (Tammy Moses) ?The patient actually initially presented to the hospital this admission for elective debridement of her left AKA stump, which had some dehiscence and drainage.  (Then was noted to have severe hypertension and was admitted for medical management) ? ?This left AKA stump has improved with antibiotics and is now healing well. ? ?Peripheral arterial disease (Tammy Moses) ?- Continue aspirin and atorvastatin ? ?ESRD (end stage renal disease) (Tammy Moses) -  Dialyzes at Tammy Moses.  On Monday Wednesday Friday. ?- Consult nephrology for HD ? ?Controlled type 2 diabetes mellitus with chronic kidney disease on chronic Moses (Tammy Moses) ?Glucose normal ?-Continue SS corrections ? ?Hypertension ?See above ? ?Blindness ?   ?  ? ? ? ? ? ? ? ? ? ?Subjective: Patient has no headache, chest pain, dyspnea, fever, cough, abdominal pain, nausea, vomiting, diarrhea ? ? ? ? ?Physical Exam: ?Vitals:  ? 02/15/22 0930 02/15/22 1000 02/15/22 1030 02/15/22 1149  ?  BP: (!) 141/89 (!) 171/54 (!) 179/44 (!) 130/32  ?Pulse: (!) 56 69 71 (!) 52  ?Resp:    16  ?Temp:    (!) 97.4 ?F (36.3 ?C)  ?TempSrc:    Oral  ?SpO2:    100%  ?Weight:      ?Height:      ? ?Thin adult female, blind, amblyopia, sleeping but arouses to voice, responds to questions but I am not totally certain that  she is oriented to situation ?RRR, no murmurs, no edema in the left leg ?Respiratory rate normal, lungs clear without rales or wheezes ?Abdomen soft without tenderness palpation or guarding, no ascites or distention ? ? ? ? ?Data Reviewed: ?Discussed with Nephrology, nursing notes reviewed, vitals reviewed ?Hemoglobin down to 7.7 ? ?Family Communication:   ? ? ? ?Disposition: ?Status is: Inpatient ?After transfusion, patient is medically ready for discharge, TOC working to secure safe rehabilitation setting. ? ? ? ? ? ? ? ? ? ? ? ?Author: ?Edwin Dada, MD ?02/15/2022 1:20 PM ? ?For on call review [email or web address]  ? ? ?

## 2022-02-15 NOTE — Progress Notes (Signed)
?  Progress Note ? ? ? ?02/15/2022 ?2:23 PM ?4 Days Post-Op ? ?Subjective: No complaints. Oriented. No pain ? ? ?Vitals:  ? 02/15/22 1322 02/15/22 1349  ?BP:  (!) 132/38  ?Pulse:  77  ?Resp:  16  ?Temp: 98.2 ?F (36.8 ?C) 98.7 ?F (37.1 ?C)  ?SpO2:  100%  ? ?Physical Exam: ?Cardiac:  regular ?Lungs:  non labored ?Incisions:  left groin, left leg incisions are clean, dry and intact. No swelling or hematoma ?Extremities:  Left leg well perfused and warm. Doppler peroneal signal ?Abdomen:  flat, non distended ?Neurologic: alert and oriented  ? ?CBC ?   ?Component Value Date/Time  ? WBC 12.3 (H) 02/15/2022 9233  ? RBC 2.97 (L) 02/15/2022 0738  ? HGB 7.7 (L) 02/15/2022 0738  ? HGB 12.4 12/13/2017 1416  ? HCT 24.6 (L) 02/15/2022 0738  ? HCT 37.8 12/13/2017 1416  ? PLT 393 02/15/2022 0738  ? PLT 254 12/13/2017 1416  ? MCV 82.8 02/15/2022 0738  ? MCV 83 12/13/2017 1416  ? MCH 25.9 (L) 02/15/2022 0076  ? MCHC 31.3 02/15/2022 0738  ? RDW 19.6 (H) 02/15/2022 0738  ? RDW 15.8 (H) 12/13/2017 1416  ? LYMPHSABS 2.0 02/08/2022 0428  ? LYMPHSABS 4.1 (H) 12/13/2017 1416  ? MONOABS 1.9 (H) 02/08/2022 0428  ? EOSABS 0.4 02/08/2022 0428  ? EOSABS 0.3 12/13/2017 1416  ? BASOSABS 0.1 02/08/2022 0428  ? BASOSABS 0.1 12/13/2017 1416  ? ? ?BMET ?   ?Component Value Date/Time  ? NA 129 (L) 02/15/2022 0737  ? NA 146 (H) 05/10/2017 0950  ? K 4.4 02/15/2022 0737  ? CL 91 (L) 02/15/2022 0737  ? CO2 22 02/15/2022 0737  ? GLUCOSE 148 (H) 02/15/2022 0737  ? BUN 69 (H) 02/15/2022 0737  ? BUN 38 (H) 05/10/2017 0950  ? CREATININE 9.53 (H) 02/15/2022 0737  ? CREATININE 3.40 (H) 03/11/2017 1505  ? CALCIUM 9.8 02/15/2022 0737  ? GFRNONAA 4 (L) 02/15/2022 0737  ? GFRNONAA 15 (L) 03/11/2017 1505  ? GFRAA 5 (L) 05/05/2020 1314  ? GFRAA 17 (L) 03/11/2017 1505  ? ? ?INR ?   ?Component Value Date/Time  ? INR 1.2 10/21/2021 1425  ? ? ? ?Intake/Output Summary (Last 24 hours) at 02/15/2022 1423 ?Last data filed at 02/15/2022 0700 ?Gross per 24 hour  ?Intake 240 ml   ?Output --  ?Net 240 ml  ? ? ? ? ?Assessment/Plan:  56 y.o. female is s/p left iliofemoral endarterectomy, left femoral to below-knee popliteal artery bypass with ipsilateral non-reversed GSV    ? ?Left leg remains well perfused and warm with brisk peroneal signal ?Left great toe and 2nd toe dry gangrene unchanged ?Left groin and leg incisions are c/d/I without swelling or hematoma ?Left Leg ACE reapplied ?Right AKA stable  ?Continue PT/OT ?Pending SNF placement ?Will needs outpt podiatry appointment  ? ? ?Broadus John,  ?[facility] ?[phone] ?02/15/2022 ?2:23 PM ? ? ? ?

## 2022-02-15 NOTE — TOC Progression Note (Signed)
Transition of Care (TOC) - Progression Note  ? ? ?Patient Details  ?Name: CHINWE LOPE ?MRN: [id number] ?Date of Birth: 1966-06-04 ? ?Transition of Care (TOC) CM/SW Contact  ?Vinie Sill, LCSW ?Phone Number: ?02/15/2022, 5:10 PM ? ?Clinical Narrative:    ? ?CSW met with patient at bedside. CSW provided patient with bed offers. Patient decided on [facility] and [facility]. Sent SNFs message to confirmed bed offer and availability. CSW waiting on response.  ? ?Thurmond Butts, MSW, LCSW ?Clinical Social Worker ? ? ? ?Expected Discharge Plan: [place] ?Barriers to Discharge: Ship broker, Continued Medical Work up ? ?Expected Discharge Plan and Services ?Expected Discharge Plan: [place] ?In-house Referral: Clinical Social Work ?  ?  ?  ?                ?  ?  ?  ?  ?  ?  ?  ?  ?  ?  ? ? ?Social Determinants of Health (SDOH) Interventions ?  ? ?Readmission Risk Interventions ? ?  10/09/2021  ?  4:18 PM  ?Readmission Risk Prevention Plan  ?Transportation Screening Complete  ?[place] or Home Care Consult Complete  ?SW Recovery Care/Counseling Consult Complete  ?Palliative Care Screening Not Applicable  ?Skilled Nursing Facility Complete  ? ? ?

## 2022-02-15 NOTE — Assessment & Plan Note (Addendum)
The patient actually initially presented to the hospital this admission for elective debridement of her left AKA stump, which had some dehiscence and drainage.   ? ?(there noted to have severe hypertension and was admitted for medical management) ? ?At this point, the left AKA stump has improved with antibiotics and is now healing well. ?

## 2022-02-15 NOTE — Procedures (Signed)
I was present at this dialysis session. I have reviewed the session itself and made appropriate changes.  ? ?Quick drop in BP within first hour of HD, UF Goal reduced, BPs normalized.  Prob at/near EDW, weights accuracy?  ? ?2K bath. Hb 7.7, give ESA, check Fe stores. RS ? ?Filed Weights  ? 02/13/22 1450 02/14/22 0414 02/15/22 0700  ?Weight: 46.6 kg 46.3 kg 45.7 kg  ? ? ? ?Recent Labs  ?Lab 02/15/22 ?9892  ?NA 129*  ?K 4.4  ?CL 91*  ?CO2 22  ?GLUCOSE 148*  ?BUN 69*  ?CREATININE 9.53*  ?CALCIUM 9.8  ?PHOS 6.4*  ? ? ?Recent Labs  ?Lab 02/11/22 ?0329 02/12/22 ?0123 02/15/22 ?1194  ?WBC 11.3* 16.6* 12.3*  ?HGB 11.3* 10.3* 7.7*  ?HCT 37.1 34.9* 24.6*  ?MCV 84.5 85.3 82.8  ?PLT 331 351 393  ? ? ?Scheduled Meds: ? amLODipine  10 mg Oral Daily  ? aspirin EC  81 mg Oral Daily  ? atorvastatin  80 mg Oral Daily  ? atropine  1 drop Both Eyes BID  ? buPROPion  150 mg Oral Daily  ? Chlorhexidine Gluconate Cloth  6 each Topical Q0600  ? clopidogrel  75 mg Oral Q0600  ? dorzolamide-timolol  1 drop Both Eyes BID  ? feeding supplement  1 Container Oral BID BM  ? furosemide  80 mg Oral 2 times per day on Sun Tue Thu Sat  ? heparin  5,000 Units Subcutaneous Q8H  ? hydrALAZINE  100 mg Oral Q8H  ? insulin aspart  0-6 Units Subcutaneous TID WC  ? latanoprost  1 drop Both Eyes QHS  ? losartan  100 mg Oral Daily  ? metoCLOPramide  10 mg Oral TID AC  ? metoprolol tartrate  50 mg Oral BID  ? multivitamin  1 tablet Oral QHS  ? pantoprazole  40 mg Oral Daily  ? pregabalin  25 mg Oral QHS  ? sevelamer carbonate  2,400 mg Oral TID WC  ? ?Continuous Infusions: ? sodium chloride    ? ?PRN Meds:.sodium chloride, acetaminophen **OR** acetaminophen, albuterol, guaiFENesin-dextromethorphan, hydrALAZINE, labetalol, metoprolol tartrate, ondansetron, oxyCODONE-acetaminophen, phenol, senna-docusate, sevelamer carbonate   ?Pearson Grippe  MD ?02/15/2022, 9:10 AM ?  ?

## 2022-02-15 NOTE — Assessment & Plan Note (Signed)
As evidenced by moderate loss of subcutaneous fat and severe loss of subcutaneous muscle mass in the setting of end-stage renal disease ?

## 2022-02-15 NOTE — Assessment & Plan Note (Addendum)
Baseline Hgb 9-11.  Hgb down to 7.7 4/23, transfused 1 unit PRBCs, subsequently normal.  No clinical bleeding. ?- Aranesp and Iron per nephrology ?

## 2022-02-15 NOTE — Anesthesia Postprocedure Evaluation (Signed)
Anesthesia Post Note ? ?Patient: Tammy Moses ? ?Procedure(s) Performed: LEFT BYPASS GRAFT FEMORAL-POPLITEAL ARTERY (Left: Leg Upper) ?PATCH ANGIOPLASTY WITH XENOSURE 1X6CM BIOLOGIC PATCH (Left: Groin) ?LEFT FEMORAL ENDARTERECTOMY (Left: Groin) ? ?  ? ?Patient location during evaluation: PACU ?Anesthesia Type: General ?Level of consciousness: awake and alert ?Pain management: pain level controlled ?Vital Signs Assessment: post-procedure vital signs reviewed and stable ?Respiratory status: spontaneous breathing, nonlabored ventilation, respiratory function stable and patient connected to nasal cannula oxygen ?Cardiovascular status: blood pressure returned to baseline and stable ?Postop Assessment: no apparent nausea or vomiting ?Anesthetic complications: no ? ? ?No notable events documented. ? ?Last Vitals:  ?Vitals:  ? 02/15/22 0000 02/15/22 0400  ?BP: (!) 153/60 (!) 152/49  ?Pulse: 80 72  ?Resp: 15 16  ?Temp: (!) 36.4 ?C 36.6 ?C  ?SpO2: 100% 98%  ?  ?Last Pain:  ?Vitals:  ? 02/15/22 0400  ?TempSrc: Oral  ?PainSc: 0-No pain  ? ? ?  ?  ?  ?  ?  ?  ? ?[place] ? ? ? ? ?

## 2022-02-16 ENCOUNTER — Encounter (HOSPITAL_COMMUNITY): Payer: Self-pay | Admitting: Vascular Surgery

## 2022-02-16 DIAGNOSIS — I70462 Atherosclerosis of autologous vein bypass graft(s) of the extremities with gangrene, left leg: Secondary | ICD-10-CM | POA: Diagnosis not present

## 2022-02-16 DIAGNOSIS — I150 Renovascular hypertension: Secondary | ICD-10-CM | POA: Diagnosis not present

## 2022-02-16 DIAGNOSIS — G9341 Metabolic encephalopathy: Secondary | ICD-10-CM | POA: Diagnosis not present

## 2022-02-16 DIAGNOSIS — E1122 Type 2 diabetes mellitus with diabetic chronic kidney disease: Secondary | ICD-10-CM | POA: Diagnosis not present

## 2022-02-16 LAB — GLUCOSE, CAPILLARY
Glucose-Capillary: 153 mg/dL — ABNORMAL HIGH (ref 70–99)
Glucose-Capillary: 161 mg/dL — ABNORMAL HIGH (ref 70–99)
Glucose-Capillary: 116 mg/dL — ABNORMAL HIGH (ref 70–99)
Glucose-Capillary: 140 mg/dL — ABNORMAL HIGH (ref 70–99)

## 2022-02-16 LAB — BPAM RBC
Blood Product Expiration Date: 202305102359
ISSUE DATE / TIME: 202304241323
Unit Type and Rh: 7300

## 2022-02-16 LAB — CBC
HCT: 31.5 % — ABNORMAL LOW (ref 36.0–46.0)
Hemoglobin: 10.2 g/dL — ABNORMAL LOW (ref 12.0–15.0)
MCH: 27.1 pg (ref 26.0–34.0)
MCHC: 32.4 g/dL (ref 30.0–36.0)
MCV: 83.6 fL (ref 80.0–100.0)
Platelets: 344 K/uL (ref 150–400)
RBC: 3.77 MIL/uL — ABNORMAL LOW (ref 3.87–5.11)
RDW: 18.6 % — ABNORMAL HIGH (ref 11.5–15.5)
WBC: 17.9 K/uL — ABNORMAL HIGH (ref 4.0–10.5)
nRBC: 0.1 % (ref 0.0–0.2)

## 2022-02-16 LAB — TYPE AND SCREEN
ABO/RH(D): B POS
Antibody Screen: NEGATIVE
Unit division: 0

## 2022-02-16 LAB — METANEPHRINES, PLASMA
Metanephrine, Free: 62.4 pg/mL (ref 0.0–88.0)
Normetanephrine, Free: 75.8 pg/mL (ref 0.0–244.0)

## 2022-02-16 MED ORDER — SENNOSIDES-DOCUSATE SODIUM 8.6-50 MG PO TABS
2.0000 | ORAL_TABLET | Freq: Every day | ORAL | Status: DC
  Administered 2022-02-16 – 2022-02-19 (×4): 2 via ORAL
  Filled 2022-02-16 (×4): qty 2

## 2022-02-16 MED ORDER — POLYETHYLENE GLYCOL 3350 17 G PO PACK
17.0000 g | PACK | Freq: Every day | ORAL | Status: DC
  Administered 2022-02-16 – 2022-02-19 (×4): 17 g via ORAL
  Filled 2022-02-16 (×4): qty 1

## 2022-02-16 NOTE — TOC Progression Note (Signed)
Transition of Care (TOC) - Progression Note  ? ? ?Patient Details  ?Name: Tammy Moses ?MRN: [id number] ?Date of Birth: 1966/04/20 ? ?Transition of Care (TOC) CM/SW Contact  ?Vinie Sill, LCSW ?Phone Number: ?02/16/2022, 4:33 PM ? ?Clinical Narrative:    ? ?Spoke with patient's niece and advised of bed offer with [facility]. Patient's niece and mother was agreeable to [facility]. ? ?[facility] confirmed IF patient is able to to get temporary HD clinic here in [place]. CSW sent message to Renal Navigator Olivia Mackie to see if she is able to get clinic changed. CSW waiting on response. ? ?TOC will continue to follow and assist with discharge planning.   ? ?Expected Discharge Plan: [place] ?Barriers to Discharge: SNF Pending bed offer, Insurance Authorization ? ?Expected Discharge Plan and Services ?Expected Discharge Plan: [place] ?In-house Referral: Clinical Social Work ?  ?  ?  ?                ?  ?  ?  ?  ?  ?  ?  ?  ?  ?  ? ? ?Social Determinants of Health (SDOH) Interventions ?  ? ?Readmission Risk Interventions ? ?  10/09/2021  ?  4:18 PM  ?Readmission Risk Prevention Plan  ?Transportation Screening Complete  ?[place] or Home Care Consult Complete  ?SW Recovery Care/Counseling Consult Complete  ?Palliative Care Screening Not Applicable  ?Skilled Nursing Facility Complete  ? ? ?

## 2022-02-16 NOTE — Care Management Important Message (Signed)
Important Message ? ?Patient Details  ?Name: Tammy Moses ?MRN: [id number] ?Date of Birth: Feb 05, 1966 ? ? ?Medicare Important Message Given:  Yes ? ? ? ? ?[name] ?02/16/2022, 8:21 AM ?

## 2022-02-16 NOTE — TOC Progression Note (Signed)
Transition of Care (TOC) - Progression Note  ? ? ?Patient Details  ?Name: Tammy Moses ?MRN: [id number] ?Date of Birth: 11-Aug-1966 ? ?Transition of Care (TOC) CM/SW Contact  ?Vinie Sill, LCSW ?Phone Number: ?02/16/2022, 2:00 PM ? ?Clinical Narrative:    ? ?Re-sent referral to [facility]- requested they review for possible placement ? ?Thurmond Butts, MSW, LCSW ?Clinical Social Worker ? ? ? ?Expected Discharge Plan: [place] ?Barriers to Discharge: SNF Pending bed offer, Insurance Authorization ? ?Expected Discharge Plan and Services ?Expected Discharge Plan: [place] ?In-house Referral: Clinical Social Work ?  ?  ?  ?                ?  ?  ?  ?  ?  ?  ?  ?  ?  ?  ? ? ?Social Determinants of Health (SDOH) Interventions ?  ? ?Readmission Risk Interventions ? ?  10/09/2021  ?  4:18 PM  ?Readmission Risk Prevention Plan  ?Transportation Screening Complete  ?[place] or Home Care Consult Complete  ?SW Recovery Care/Counseling Consult Complete  ?Palliative Care Screening Not Applicable  ?Skilled Nursing Facility Complete  ? ? ?

## 2022-02-16 NOTE — TOC Progression Note (Signed)
Transition of Care (TOC) - Progression Note  ? ? ?Patient Details  ?Name: Tammy Moses ?MRN: [id number] ?Date of Birth: Jun 02, 1966 ? ?Transition of Care (TOC) CM/SW Contact  ?Vinie Sill, LCSW ?Phone Number: ?02/16/2022, 10:42 AM ? ?Clinical Narrative:    ? ?Sent referral to [facility] for review  ? ?Expected Discharge Plan: [place] ?Barriers to Discharge: SNF Pending bed offer, Insurance Authorization ? ?Expected Discharge Plan and Services ?Expected Discharge Plan: [place] ?In-house Referral: Clinical Social Work ?  ?  ?  ?                ?  ?  ?  ?  ?  ?  ?  ?  ?  ?  ? ? ?Social Determinants of Health (SDOH) Interventions ?  ? ?Readmission Risk Interventions ? ?  10/09/2021  ?  4:18 PM  ?Readmission Risk Prevention Plan  ?Transportation Screening Complete  ?[place] or Home Care Consult Complete  ?SW Recovery Care/Counseling Consult Complete  ?Palliative Care Screening Not Applicable  ?Skilled Nursing Facility Complete  ? ? ?

## 2022-02-16 NOTE — Progress Notes (Signed)
Occupational Therapy Treatment ?Patient Details ?Name: Tammy Moses ?MRN: [id number] ?DOB: 04/25/1966 ?Today's Date: 02/16/2022 ? ? ?History of present illness 56 yo female presenting on 02/04/2022 to [facility] outpatient surgery for elective wound debridement and angiogram, preop found to have severe hypertension. Admitted to ICU. S/p Ultrasound-guided micropuncture access of the right common femoral artery and Second-order cannulation, left lower extremity angiogram on 4/19. S/p Left iliofemoral endarterectomy and Left femoral to below-knee popliteal artery bypass on 4/20. 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  ?OT comments ? Pt progressing towards established OT goals. Pt performing squat pivot to w/c with Max A +2 and then lateral scoot back to bed with Mod A +2. Pt continues to present with decreased balance, cognition, and activity tolerance. Continue to recommend dc to SNF and will continue to follow acutely as admitted.   ? ?Recommendations for follow up therapy are one component of a multi-disciplinary discharge planning process, led by the attending physician.  Recommendations may be updated based on patient status, additional functional criteria and insurance authorization. ?   ?Follow Up Recommendations ? Skilled nursing-short term rehab (<3 hours/day)  ?  ?Assistance Recommended at Discharge Frequent or constant Supervision/Assistance  ?Patient can return home with the following ? A lot of help with walking and/or transfers;A lot of help with bathing/dressing/bathroom ?  ?Equipment Recommendations ? Other (comment)  ?  ?Recommendations for Other Services PT consult ? ?  ?Precautions / Restrictions Precautions ?Precautions: Fall ?Precaution Comments: s/p R AKA 11/2021 ?Restrictions ?Other Position/Activity Restrictions: Recent R AKA  ? ? ?  ? ?Mobility Bed Mobility ?Overal bed mobility: Needs Assistance ?Bed Mobility: Supine to Sit, Sit to Supine ?  ?  ?Supine to sit: Mod assist, +2  for safety/equipment ?Sit to supine: HOB elevated, Mod assist, +2 for safety/equipment ?  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 Min/modA for anterior scooting in bed with bed pad assist. ?  ? ?Transfers ?Overall transfer level: Needs assistance ?Equipment used: 2 person hand held assist ?Transfers: Bed to chair/wheelchair/BSC ?  ?  ?Squat pivot transfers: Max assist, +2 physical assistance ?  ?  ? Lateral/Scoot Transfers: Mod assist, +2 safety/equipment ?General transfer comment: Max A +2 for power up to stand/pivot. Requiring L knee blocked throughout. Limited strength and tolerance and benefits from B HHA via forearms and support at posterior hip/bottom to encourage trunk extension, pt unable to maintain full trunk extension so performed squat pivot to wheelchair. For return from wheelchair to bed, pt performed lateral scoot transfer from wc with +1 modA and second person present providing supervision for safety but not needing to provide lift assist while scooting. ?  ?  ?Balance Overall balance assessment: Needs assistance ?Sitting-balance support: Feet supported, Single extremity supported, Bilateral upper extremity supported (intermittent increased support from therapist) ?Sitting balance-Leahy Scale: Poor ?Sitting balance - Comments: Reliant on UE support and variable min guard to minA for sitting balance ?Postural control: Posterior lean ?Standing balance support: Bilateral upper extremity supported, During functional activity ?Standing balance-Leahy Scale: Zero ?Standing balance comment: Max A +2 ?  ?  ?  ?  ?  ?  ?  ?  ?  ?  ?  ?   ? ?ADL either performed or assessed with clinical judgement  ? ?ADL Overall ADL's : Needs assistance/impaired ?  ?  ?  ?  ?  ?  ?  ?  ?  ?  ?  Lower Body Dressing: Maximal assistance ?Lower Body Dressing Details (indicate cue type and reason): max A to don sock ?Toilet Transfer:  Maximal assistance;+2 for safety/equipment;Squat-pivot ?  ?  ?  ?  ?  ?  ?General ADL Comments: Pt performing stand pivot to w/c and completing mobility in w/c down hallway and back. ?  ? ?Extremity/Trunk Assessment Upper Extremity Assessment ?Upper Extremity Assessment: Generalized weakness ?  ?Lower Extremity Assessment ?Lower Extremity Assessment: Defer to PT evaluation ?  ?  ?  ? ?Vision   ?  ?  ?Perception   ?  ?Praxis   ?  ? ?Cognition Arousal/Alertness: Awake/alert ?Behavior During Therapy: Flat affect, Restless ?Overall Cognitive Status: History of cognitive impairments - at baseline ?  ?  ?  ?  ?  ?  ?  ?  ?  ?  ?  ?  ?  ?  ?  ?  ?General Comments: Pt following simple 1-step commands with increased time and cues. ?  ?  ?   ?Exercises Exercises: Other exercises ?Other Exercises ?Other Exercises: supine LLE AROM: ankle pumps x10 reps ? ?  ?Shoulder Instructions   ? ? ?  ?General Comments VSS on RA; no acute s/sx distress; dry/hard stool observed, RN/NT notified  ? ? ?Pertinent Vitals/ Pain       Pain Assessment ?Pain Assessment: Faces ?Faces Pain Scale: Hurts little more ?Pain Location: L foot (dorsum), R residual limb ?Pain Descriptors / Indicators: Discomfort, Guarding ?Pain Intervention(s): Monitored during session, Limited activity within patient's tolerance, Repositioned ? ?Home Living   ?  ?  ?  ?  ?  ?  ?  ?  ?  ?  ?  ?  ?  ?  ?  ?  ?  ?  ? ?  ?Prior Functioning/Environment    ?  ?  ?  ?   ? ?Frequency ? Min 2X/week  ? ? ? ? ?  ?Progress Toward Goals ? ?OT Goals(current goals can now be found in the care plan section) ? Progress towards OT goals: Progressing toward goals ? ?Acute Rehab OT Goals ?OT Goal Formulation: With patient ?Time For Goal Achievement: 03/24/2022 ?Potential to Achieve Goals: Good ?ADL Goals ?Pt Will Perform Grooming: with set-up;with supervision;sitting ?Pt Will Perform Lower Body Dressing: with min assist;sit to/from stand ?Pt Will Transfer to Toilet: bedside commode;stand pivot  transfer;with min assist ?Additional ADL Goal #1: Pt will follow two step commends during ADLs with Mod cues  ?Plan Discharge plan remains appropriate   ? ?Co-evaluation ? ? ? PT/OT/SLP Co-Evaluation/Treatment: Yes ?Reason for Co-Treatment: To address functional/ADL transfers;For patient/therapist safety ?PT goals addressed during session: Mobility/safety with mobility;Balance;Strengthening/ROM ?OT goals addressed during session: ADL's and self-care ?  ? ?  ?AM-PAC OT "6 Clicks" Daily Activity     ?Outcome Measure ? ? Help from another person eating meals?: A Lot ?Help from another person taking care of personal grooming?: A Lot ?Help from another person toileting, which includes using toliet, bedpan, or urinal?: A Lot ?Help from another person bathing (including washing, rinsing, drying)?: A Lot ?Help from another person to put on and taking off regular upper body clothing?: A Lot ?Help from another person to put on and taking off regular lower body clothing?: Total ?6 Click Score: 11 ? ?  ?End of Session   ? ?OT Visit Diagnosis: Unsteadiness on feet (R26.81);Other abnormalities of gait and mobility (R26.89);Muscle weakness (generalized) (M62.81) ?  ?Activity Tolerance Patient tolerated treatment well ?  ?Patient  Left in bed;with call bell/phone within reach;with bed alarm set ?  ?Nurse Communication Mobility status ?  ? ?   ? ?Time: 8159-4707 ?OT Time Calculation (min): 23 min ? ?Charges: OT General Charges ?$OT Visit: 1 Visit ?OT Treatments ?$Self Care/Home Management : 8-22 mins ? ?Nickalous Stingley MSOT, OTR/L ?Acute Rehab ?Pager: [phone] ?Office: [phone] ? ?Revere Maahs M Crosley Stejskal ?02/16/2022, 5:22 PM ? ? ?

## 2022-02-16 NOTE — Progress Notes (Signed)
Nephrology Follow-Up Consult note ? ? ?Assessment/Recommendations: Tammy Moses is a/an 56 y.o. female with a past medical history significant for ESRD on HD, PAD, DM2, HTN initially presented for outpatient elective wound debridement and angio however was found to have severe uncontrolled HTN.  ?  ?# ESRD:  ?-outpatient orders: [facility].  MWF.  L381OF.  3.5 hours.  350/600.  2K, 2.5 Cal.  LUE AVF.  Heparin 1500 units bolus, 500 units maintenance.  Venofer 50 mg q. treatment, EPO 11,800 units, Sensipar 30 q. Mondays.  ?-Continue MWF schedule; EDW probably around 45kg ?  ?# Volume/ severe hypertension: EDW 44.5-45kg. she is no longer tolerating much UF ?-Continue labetalol and hydral, losartan, Lasix.   ?  ?# PAD s/p right AKA w/ nonhealing wound/gangrene, leukocytosis ?-recent rt AKA after unsuccessful revascularization  ?- s/p left fem below knee pop bypass with ipsilateral non reversed GSV + left iliofemoral endarterectomy wit hVVS ?-abx per primary service if indicated ?  ?# Anemia of Chronic Kidney Disease: Hemoglobin 10.2. Tranfsued 4/24 ?  ?# Secondary Hyperparathyroidism/Hyperphosphatemia: resumed home binders  sevelamer 3 tabs TIDM ?  ?# Vascular access: LUE BCF ? ?Possibly requiring placement at SNF ?  ?# Additional recommendations: ?- Dose all meds for creatinine clearance < 10 ml/min  ?- Unless absolutely necessary, no MRIs with gadolinium.  ?- Implement save arm precautions.  Prefer needle sticks in the dorsum of the hands or wrists.  No blood pressure measurements in left arm. ?- If blood transfusion is requested during hemodialysis sessions, please alert [place] prior to the session.  ? ? ?Rexene Agent ?[facility] ?02/16/2022 ?12:49 PM ? ?___________________________________________________________ ? ?CC: ESRD ? ?Interval History/Subjective:  ?HD yesterday quick decline in BP with HD, Uf only 0.7L ?no c/o at this time, Seen in room ? ?Medications:  ?Current Facility-Administered  Medications  ?Medication Dose Route Frequency Provider Last Rate Last Admin  ? 0.9 %  sodium chloride infusion (Manually program via Guardrails IV Fluids)   Intravenous Once Danford, Suann Larry, MD      ? 0.9 %  sodium chloride infusion  500 mL Intravenous Once PRN Dagoberto Ligas, PA-C      ? acetaminophen (TYLENOL) tablet 325-650 mg  325-650 mg Oral Q4H PRN Dagoberto Ligas, PA-C   650 mg at 02/16/22 7510  ? Or  ? acetaminophen (TYLENOL) suppository 325-650 mg  325-650 mg Rectal Q4H PRN Dagoberto Ligas, PA-C      ? albuterol (PROVENTIL) (2.5 MG/3ML) 0.083% nebulizer solution 3 mL  3 mL Inhalation Q4H PRN Dagoberto Ligas, PA-C      ? amLODipine (NORVASC) tablet 10 mg  10 mg Oral Daily Dagoberto Ligas, PA-C   10 mg at 02/16/22 2585  ? aspirin EC tablet 81 mg  81 mg Oral Daily Dagoberto Ligas, PA-C   81 mg at 02/16/22 2778  ? atorvastatin (LIPITOR) tablet 80 mg  80 mg Oral Daily Dagoberto Ligas, PA-C   80 mg at 02/16/22 2423  ? atropine 1 % ophthalmic solution 1 drop  1 drop Both Eyes BID Dagoberto Ligas, PA-C   1 drop at 02/16/22 5361  ? Chlorhexidine Gluconate Cloth 2 % PADS 6 each  6 each Topical Q0600 Dagoberto Ligas, PA-C   6 each at 02/16/22 4431  ? clopidogrel (PLAVIX) tablet 75 mg  75 mg Oral Q0600 Dagoberto Ligas, PA-C   75 mg at 02/16/22 5400  ? Darbepoetin Alfa (ARANESP) injection 60 mcg  60 mcg Intravenous Q Mon-HD Rexene Agent, MD  60 mcg at 02/15/22 0946  ? dorzolamide-timolol (COSOPT) 22.3-6.8 MG/ML ophthalmic solution 1 drop  1 drop Both Eyes BID Dagoberto Ligas, PA-C   1 drop at 02/16/22 5573  ? feeding supplement (BOOST / RESOURCE BREEZE) liquid 1 Container  1 Container Oral BID BM Danford, Suann Larry, MD   1 Container at 02/14/22 1405  ? furosemide (LASIX) tablet 80 mg  80 mg Oral 2 times per day on Sun Tue Thu Sat Dagoberto Ligas, PA-C   80 mg at 02/16/22 2202  ? guaiFENesin-dextromethorphan (ROBITUSSIN DM) 100-10 MG/5ML syrup 15 mL  15 mL Oral Q4H PRN Dagoberto Ligas, PA-C       ? heparin injection 5,000 Units  5,000 Units Subcutaneous Q8H Dagoberto Ligas, PA-C   5,000 Units at 02/16/22 5427  ? hydrALAZINE (APRESOLINE) injection 10 mg  10 mg Intravenous Q8H PRN Dagoberto Ligas, PA-C   10 mg at 02/05/22 1119  ? hydrALAZINE (APRESOLINE) tablet 100 mg  100 mg Oral Q8H Danford, Suann Larry, MD   100 mg at 02/16/22 0520  ? insulin aspart (novoLOG) injection 0-6 Units  0-6 Units Subcutaneous TID WC Dagoberto Ligas, PA-C   1 Units at 02/16/22 1229  ? labetalol (NORMODYNE) injection 10 mg  10 mg Intravenous Q2H PRN Dagoberto Ligas, PA-C   10 mg at 02/14/22 1815  ? latanoprost (XALATAN) 0.005 % ophthalmic solution 1 drop  1 drop Both Eyes QHS Dagoberto Ligas, PA-C   1 drop at 02/15/22 2046  ? losartan (COZAAR) tablet 100 mg  100 mg Oral Daily Dagoberto Ligas, PA-C   100 mg at 02/16/22 0623  ? metoCLOPramide (REGLAN) tablet 10 mg  10 mg Oral TID AC Dagoberto Ligas, PA-C   10 mg at 02/16/22 7628  ? metoprolol tartrate (LOPRESSOR) injection 2-5 mg  2-5 mg Intravenous Q2H PRN Dagoberto Ligas, PA-C      ? metoprolol tartrate (LOPRESSOR) tablet 50 mg  50 mg Oral BID Dagoberto Ligas, PA-C   50 mg at 02/16/22 3151  ? multivitamin (RENA-VIT) tablet 1 tablet  1 tablet Oral QHS Dagoberto Ligas, PA-C   1 tablet at 02/15/22 2045  ? ondansetron (ZOFRAN) injection 4 mg  4 mg Intravenous Q6H PRN Dagoberto Ligas, PA-C      ? oxyCODONE-acetaminophen (PERCOCET/ROXICET) 5-325 MG per tablet 1-2 tablet  1-2 tablet Oral Q4H PRN Dagoberto Ligas, PA-C   2 tablet at 02/14/22 1800  ? pantoprazole (PROTONIX) EC tablet 40 mg  40 mg Oral Daily Dagoberto Ligas, PA-C   40 mg at 02/16/22 7616  ? phenol (CHLORASEPTIC) mouth spray 1 spray  1 spray Mouth/Throat PRN Dagoberto Ligas, PA-C      ? senna-docusate (Senokot-S) tablet 1 tablet  1 tablet Oral Daily PRN Dagoberto Ligas, PA-C   1 tablet at 02/13/22 2114  ? sevelamer carbonate (RENVELA) tablet 2,400 mg  2,400 mg Oral TID WC Dagoberto Ligas, PA-C   2,400 mg at  02/16/22 0737  ? sevelamer carbonate (RENVELA) tablet 800 mg  800 mg Oral PRN Dagoberto Ligas, PA-C   800 mg at 02/05/22 1635  ?  ? ? ?Review of Systems: ?10 systems reviewed and negative except per interval history/subjective ? ?Physical Exam: ?Vitals:  ? 02/16/22 0712 02/16/22 1140  ?BP: (!) 184/93 (!) 150/45  ?Pulse: 80 78  ?Resp: 18 18  ?Temp: 98.8 ?F (37.1 ?C) 97.9 ?F (36.6 ?C)  ?SpO2: 99% 97%  ? ?No intake/output data recorded. ? ?Intake/Output Summary (Last 24 hours) at 02/16/2022 1249 ?Last data filed at 02/15/2022 1800 ?  Gross per 24 hour  ?Intake 120 ml  ?Output 125 ml  ?Net -5 ml  ? ? ? ?Constitutional: Chronically ill-appearing, no distress ?ENMT: ears and nose without scars or lesions, MMM ?CV: normal rate, no audible rub ?Respiratory: Bilateral chest rise, normal work of breathing ?Gastrointestinal: soft, nondistended ?Psych: alert, appropriate mood and affect ?Access: LUA BCF, no infiltration ? ? ?Test Results ?I personally reviewed new and old clinical labs and radiology tests ?Lab Results  ?Component Value Date  ? NA 129 (L) 02/15/2022  ? K 4.4 02/15/2022  ? CL 91 (L) 02/15/2022  ? CO2 22 02/15/2022  ? BUN 69 (H) 02/15/2022  ? CREATININE 9.53 (H) 02/15/2022  ? CALCIUM 9.8 02/15/2022  ? ALBUMIN 2.3 (L) 02/15/2022  ? PHOS 6.4 (H) 02/15/2022  ? ? ?CBC ?Recent Labs  ?Lab 02/12/22 ?0123 02/15/22 ?7414 02/16/22 ?0102  ?WBC 16.6* 12.3* 17.9*  ?HGB 10.3* 7.7* 10.2*  ?HCT 34.9* 24.6* 31.5*  ?MCV 85.3 82.8 83.6  ?PLT 351 393 344  ? ? ? ? ? ? ?

## 2022-02-16 NOTE — TOC Progression Note (Addendum)
Transition of Care (TOC) - Progression Note  ? ? ?Patient Details  ?Name: Tammy Moses ?MRN: [id number] ?Date of Birth: 02/28/1966 ? ?Transition of Care (TOC) CM/SW Contact  ?Vinie Sill, LCSW ?Phone Number: ?02/16/2022, 10:00 AM ? ?Clinical Narrative:    ? ?Called [facility]) - left voice message to return call- needs to confirm bed offer and if they can meet patient's dialysis needs. ? ?[facility]- unable to offer at this time  ? ?TOC is waiting on call back. ? ?Thurmond Butts, MSW, LCSW ?Clinical Social Worker ? ? ? ?Expected Discharge Plan: [place] ?Barriers to Discharge: SNF Pending bed offer, Insurance Authorization ? ?Expected Discharge Plan and Services ?Expected Discharge Plan: [place] ?In-house Referral: Clinical Social Work ?  ?  ?  ?                ?  ?  ?  ?  ?  ?  ?  ?  ?  ?  ? ? ?Social Determinants of Health (SDOH) Interventions ?  ? ?Readmission Risk Interventions ? ?  10/09/2021  ?  4:18 PM  ?Readmission Risk Prevention Plan  ?Transportation Screening Complete  ?[place] or Home Care Consult Complete  ?SW Recovery Care/Counseling Consult Complete  ?Palliative Care Screening Not Applicable  ?Skilled Nursing Facility Complete  ? ? ?

## 2022-02-16 NOTE — Progress Notes (Signed)
?Progress Note ? ? ?Patient: Tammy Moses XIP:[id number] DOB: December 28, 1965 DOA: 02/04/2022     12 ?DOS: the patient was seen and examined on 02/16/2022 at 10:10AM ? ?  ? ? ? ?Brief hospital course: ?Tammy Moses is a 56 y.o. F with ESRD on HD, HTN, DM, PVD s/p R AKA 2 months ago, who presented with severe hypertension from preop. ? ?Evidently, patient had an AKA on right in Feb, was discharged from SNF with no pain medication, without having had her staples removed, with obvious new dry gangrene of her left great toe, and without a Vascular surgery follow up until April. At that appointment, the patient was scheduled urgently for debridement of mild dehiscence of the right AKA stump as well as LLE angiogram.    ? ?On the day of surgery, unfortunately, the patient's BP in preop was 673 mmHg systolic, she vomited and had chest pain, so surgery was deferred and she was admitted to the ICU for BP control on Cleviprex. ? ? ?4/13: Sent from preop for admission due to severe HTN >> admitted to ICU on Cleviprex ?4/14: BP self corrected rapidly, put back on oral agents, transferred to floor ?4/17: Vascular surgery consulted, unclear delay ?4/19: Angiography of left leg completed, bypass offered ?4/20: LEFT fem-pop bypass by Dr. Virl Cagey ?4/21-23: Given hygiene concerns, family concerns -> SNF placement sought, of note, patient with waxing and waning disorientation ?4/24: Transfused 1 unit PRBCs ?4/25: Palliative care consulted in setting of persistent delirium, concern for failure to thrive ? ? ? ? ? ? ? ? ?Assessment and Plan: ?* Critical limb ischemia of left lower extremity with gangrene  ?Peripheral arterial disease ?S/p fem-pop bypass 4/20 by Dr. Virl Cagey ?Cleared for d/c to SNF from post-op standpoint ? ?- Follow up with Dr. Virl Cagey Vascular surgery after discharge ?-Continue aspirin, atorvastatin, Plavix ? ? ? ? ?Hypertensive urgency ?Essential hypertension ?Initial presenting complaint.  BP remains very very labile, but our  management has stabilized. ? ?Of note, Dr. Virl Cagey and I reviewed her arterial line BP and cuff BP during her bypass on 4/20, and the two correlated fairly well at that time, typically within +/-5 mmHg systolic and diastolic.    ? ?Unfortunately, severe intra-dialytic hypotension during her last two sessions precludes titrating antihypertensives up at this point.  D/w Nephrology. ? ?- Continue amlodipine furosemide, metoprolol, hydralazine and losartan ? ? ? ? ?Delirium ?Patient's family reported concerns of intermittent confusion at home for several weeks prior to admission. ? ?Here, she has demonstrated the same.  This seems to wax and wane.  At times she is able to be re-oriented, at times not.  It is exacerbated by blindness and immobility.   ? ?Given her malnutrition, persistent delirium (reported by family prior to admission) and end stage renal disease complicated by intra-dialytic hypotension, I am concerned that she may be starting a slow decline and may not be able to tolerate dialysis much longer. ? ?This is far from clear at this time, but I recommend palliative begin discussions with her niece/HCPOA about what goals of care would look like.  ?- Consult Palliative care ? ? ? ? ?Malnutrition of moderate degree ?As evidenced by moderate loss of subcutaneous fat and severe loss of subcutaneous muscle mass in the setting of end-stage renal disease ? ?S/P AKA (above knee amputation) unilateral, right (Tammy Moses) ?The patient actually initially presented to the hospital this admission for elective debridement of her left AKA stump, which had some dehiscence and drainage.   ? ?(  there noted to have severe hypertension and was admitted for medical management) ? ?At this point, the left AKA stump has improved with antibiotics and is now healing well. ? ? ? ? ?ESRD (end stage renal disease) ([place]) -  Dialyzes at [facility].  On Monday Wednesday Friday. ?- Consult nephrology for HD ?- Phosbinders and aranesp her  Nephrology ? ?Anemia in chronic kidney disease (CKD) ?Baseline Hgb 9-11.  Hgb down to 7.7 4/23, transfused 1 unit PRBCs, subsequently normal.  No clinical bleeding. ?- Aranesp and Iron per nephrology ? ?Controlled type 2 diabetes mellitus with chronic kidney disease on chronic dialysis ([place]) ?Glucose normal ?-Continue SS corrections ? ?  ? ?Blindness ?    ? ?  ? ? ? ? ? ? ? ? ? ?Subjective: The patient is somewhat confused, she wants a wrap on her leg.  She is asking about what happened to her legs, whether they were "in a car accident or something."  Denies other complaints.  She is upside down in her bed. ? ? ? ? ?Physical Exam: ?Vitals:  ? 02/16/22 0000 02/16/22 0520 02/16/22 0712 02/16/22 1140  ?BP: (!) 178/50 (!) 177/48 (!) 184/93 (!) 150/45  ?Pulse: 77 80 80 78  ?Resp: 16 17 18 18   ?Temp: 97.9 ?F (36.6 ?C) 97.8 ?F (36.6 ?C) 98.8 ?F (37.1 ?C) 97.9 ?F (36.6 ?C)  ?TempSrc: Oral Oral Oral Oral  ?SpO2: 100% 100% 99% 97%  ?Weight:  46.9 kg    ?Height:      ? ?Thin under nourished appearing adult female, partially edentulous, sitting wrongly in her bed, appears somewhat restless. ?Blind. ?RRR, no murmurs, no peripheral edema, no JVD ?Respiratory effort normal, lungs clear without rales or wheezes ?Attentive to my voice, remembers who I am, but does not remember that she had surgery for bypass, somewhat disoriented as to where she is and why, what time it is or what is going on around her.  Speech is fluent, face is symmetric, moves upper extremities with normal strength and coordination ? ? ? ? ?Data Reviewed: ?Discussed with vascular surgery and nephrology.  Nursing notes reviewed, vital signs reviewed ?Hemoglobin 10.2, improved from previous ?Glucose good ? ? ? ? ? ?Disposition: ?Status is: Inpatient ?The patient presented to the hospital for debridement of her right AKA stump. ? ?This was thwarted by severe range pressure for which she was admitted to the ICU. ? ?This severe blood pressure actually corrected  essentially by itself, and we have stabilized her antihypertensive regimen since then (With the limits that she has intradialytic hypotension and so we can keep the pressures no lower than 170s typically). ? ?Vascular surgery were subsequently reconsulted.  The initial problem, her right AKA stump resolved itself, and so they directed attention to her left great toe gangrene and found Flow-limiting stenosis in the common femoral artery. ? ?She had a subsequent femoral artery bypass, and is essentially stable for discharge from that standpoint.  Due to concerns with supervision and hygiene at her niece's house, niece felt that patient would be better served recovering in a SNF. ? ?Patient is medically ready for discharge to SNF when SNF available.  ? ?in the meantime we will consult palliative care to begin outlining goals of care, should she decline ? ? ? ? ? ? ? ? ? ? ? ? ?Author: ?Edwin Dada, MD ?02/16/2022 1:36 PM ? ?For on call review [email or web address].  ? ? ?

## 2022-02-16 NOTE — Progress Notes (Addendum)
[facility] ? ?Subjective  - States her foot feels better. ? ? ?Objective ?(!) 184/93 ?80 ?98.8 ?F (37.1 ?C) (Oral) ?18 ?99% ? ?Intake/Output Summary (Last 24 hours) at 02/16/2022 0800 ?Last data filed at 02/15/2022 1800 ?Gross per 24 hour  ?Intake 120 ml  ?Output 825 ml  ?Net -705 ml  ? ? ?Doppler signal peroneal/PT ?Left groin soft healing well, LLE incision healing well ?Dry gangrene 2 nd toe unchanged ?Lungs non labored breathing ?Abdomin soft  ? ? ? ?Assessment/Planning: ?56 y.o. female is s/p left iliofemoral endarterectomy, left femoral to below-knee popliteal artery bypass with ipsilateral non-reversed GSV    ?Previous right AKA ? ?Good inflow with doppler signal peroneal left LE ?Allowing left second toe to demarcate ?PT/OT mobility ?Pending D/C to SNF ?Plans for DPM to follow toe wound ? ? ?[name] ?02/16/2022 ?8:00 AM ?-- ? ?VASCULAR STAFF ADDENDUM: ?I have independently interviewed and examined the patient. ?I agree with the above.  ?Confused this morning. Didn't realize she was in the hospital. ?No complaints ?No pain ?Signal in the foot.  ?Medically ready for d/c from vascular surgery perspective.  ?High risk for wound break down due to hygiene. ?Pt dispo is challenging as niece admitted to having difficulty taking care of her.  ? ?[name], MD ?[facility] ?Office Phone Number: [phone] ?02/16/2022 8:39 AM ? ? ? ?Laboratory ?Lab Results: ?Recent Labs  ?  02/15/22 ?0626 02/16/22 ?0102  ?WBC 12.3* 17.9*  ?HGB 7.7* 10.2*  ?HCT 24.6* 31.5*  ?PLT 393 344  ? ?BMET ?Recent Labs  ?  02/15/22 ?0737  ?NA 129*  ?K 4.4  ?CL 91*  ?CO2 22  ?GLUCOSE 148*  ?BUN 69*  ?CREATININE 9.53*  ?CALCIUM 9.8  ? ? ?COAG ?Lab Results  ?Component Value Date  ? INR 1.2 10/21/2021  ? INR 1.2 09/16/2021  ? INR 0.88 01/30/2014  ? ?No results found for: PTT ? ? ? ?

## 2022-02-16 NOTE — Progress Notes (Signed)
Physical Therapy Treatment ?Patient Details ?Name: Tammy Moses ?MRN: [id number] ?DOB: 08-24-1966 ?Today's Date: 02/16/2022 ? ? ?History of Present Illness 56 yo female presenting on 02/04/2022 to [facility] outpatient surgery for elective wound debridement and angiogram, preop found to have severe hypertension. Admitted to ICU. S/p Ultrasound-guided micropuncture access of the right common femoral artery and Second-order cannulation, left lower extremity angiogram on 4/19. S/p Left iliofemoral endarterectomy and Left femoral to below-knee popliteal artery bypass on 4/20. 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 ?  ?PT Comments  ? ? Pt received in supine, agreeable to therapy session and with good participation and tolerance for transfer training and wheelchair mobility/navigation training. Pt needing up to +2 maxA for squat pivot to wheelchair and modA for lateral scoot transfer from chair to bed. Pt with improved seated balance and activity tolerance this date. Pt continues to benefit from PT services to progress toward functional mobility goals.    ?Recommendations for follow up therapy are one component of a multi-disciplinary discharge planning process, led by the attending physician.  Recommendations may be updated based on patient status, additional functional criteria and insurance authorization. ? ?Follow Up Recommendations ? Skilled nursing-short term rehab (<3 hours/day) ?  ?  ?Assistance Recommended at Discharge Frequent or constant Supervision/Assistance  ?Patient can return home with the following Two people to help with walking and/or transfers;A lot of help with bathing/dressing/bathroom;Assistance with cooking/housework;Direct supervision/assist for medications management;Direct supervision/assist for financial management;Assist for transportation;Help with stairs or ramp for entrance ?  ?Equipment Recommendations ? Other (comment) (defer to next venue of care)  ?  ?Recommendations  for Other Services   ? ? ?  ?Precautions / Restrictions Precautions ?Precautions: Fall ?Precaution Comments: s/p R AKA 11/2021 ?Restrictions ?Other Position/Activity Restrictions: Recent R AKA  ?  ? ?Mobility ? Bed Mobility ?Overal bed mobility: Needs Assistance ?Bed Mobility: Supine to Sit, Sit to Supine ?  ?  ?Supine to sit: Mod assist, +2 for safety/equipment ?Sit to supine: HOB elevated, Mod assist, +2 for safety/equipment ?  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 Min/modA for anterior scooting in bed with bed pad assist. ?  ? ?Transfers ?Overall transfer level: Needs assistance ?Equipment used: 2 person hand held assist ?Transfers: Bed to chair/wheelchair/BSC ?  ?  ?  ?Squat pivot transfers: Max assist, +2 physical assistance ?  ? Lateral/Scoot Transfers: Mod assist, +2 safety/equipment ?General transfer comment: Max A +2 for power up to stand/pivot. Requiring L knee blocked throughout. Limited strength and tolerance and benefits from B HHA via forearms and support at posterior hip/bottom to encourage trunk extension, pt unable to maintain full trunk extension so performed squat pivot to wheelchair. For return from wheelchair to bed, pt performed lateral scoot transfer from wc with +1 modA and second person present providing supervision for safety but not needing to provide lift assist while scooting. ?  ? ?Ambulation/Gait ?  ?  ?  ?  ?  ?  ?  ?General Gait Details: unable ? ? ?Stairs ?  ?  ?  ?  ?  ? ? ?Wheelchair Mobility ?Wheelchair Mobility ?Wheelchair mobility: Yes ?Wheelchair propulsion: Both upper extremities ?Wheelchair parts: Needs assistance ?Distance: 50 ?Wheelchair Assistance Details (indicate cue type and reason): Pt needing instruction on locking wheelchair, proper hand placement on wheel rims and technique to propel and turn chair. Pt with fair carryover of instruction  within session for propelling  chair but needs frequent cues for navigation due to poor vision and for safe hand placement. Pt fatigued after ~40ft needed assist for final ~26ft to return to room. ? ?Modified Rankin (Stroke Patients Only) ?  ? ? ?  ?Balance Overall balance assessment: Needs assistance ?Sitting-balance support: Feet supported, Single extremity supported, Bilateral upper extremity supported (intermittent increased support from therapist) ?Sitting balance-Leahy Scale: Poor ?Sitting balance - Comments: Reliant on UE support and variable min guard to minA for sitting balance ?Postural control: Posterior lean ?Standing balance support: Bilateral upper extremity supported, During functional activity ?Standing balance-Leahy Scale: Zero ?Standing balance comment: Max A +2 ?  ?  ?  ?  ?  ?  ?  ?  ?  ?  ?  ?  ? ?  ?Cognition Arousal/Alertness: Awake/alert ?Behavior During Therapy: Flat affect, Restless ?Overall Cognitive Status: History of cognitive impairments - at baseline ?  ?  ?  ?  ?  ?  ?  ?  ?  ?  ?  ?  ?  ?  ?  ?  ?General Comments: Pt following simple 1-step commands with increased time and cues. ?  ?  ? ?  ?Exercises Other Exercises ?Other Exercises: supine LLE AROM: ankle pumps x10 reps ? ?  ?General Comments General comments (skin integrity, edema, etc.): VSS on RA; no acute s/sx distress; dry/hard stool observed, RN/NT notified ?  ?  ? ?Pertinent Vitals/Pain Pain Assessment ?Pain Assessment: Faces ?Faces Pain Scale: Hurts little more ?Pain Location: L foot (dorsum), R residual limb ?Pain Descriptors / Indicators: Discomfort, Guarding ?Pain Intervention(s): Monitored during session, Premedicated before session, Repositioned  ? ? ?   ?   ? ?PT Goals (current goals can now be found in the care plan section) Acute Rehab PT Goals ?Patient Stated Goal: to move around and get comfortable ?PT Goal Formulation: With patient ?Time For Goal Achievement: 03/13/2022 ?Progress towards PT goals: Progressing toward goals ? ?  ?Frequency ? ? ?  Min 2X/week ? ? ? ?  ?PT Plan Current plan remains appropriate  ? ? ?Co-evaluation PT/OT/SLP Co-Evaluation/Treatment: Yes ?Reason for Co-Treatment: Necessary to address cognition/behavior during functional activity;For patient/therapist safety;To address functional/ADL transfers ?PT goals addressed during session: Mobility/safety with mobility;Balance;Strengthening/ROM ?  ?  ? ?  ?AM-PAC PT "6 Clicks" Mobility   ?Outcome Measure ? Help needed turning from your back to your side while in a flat bed without using bedrails?: A Little ?Help needed moving from lying on your back to sitting on the side of a flat bed without using bedrails?: A Lot ?Help needed moving to and from a bed to a chair (including a wheelchair)?: A Lot ?Help needed standing up from a chair using your arms (e.g., wheelchair or bedside chair)?: Total ?Help needed to walk in hospital room?: Total ?Help needed climbing 3-5 steps with a railing? : Total ?6 Click Score: 10 ? ?  ?End of Session   ?Activity Tolerance: Patient tolerated treatment well ?Patient left: in bed;with call bell/phone within reach;with bed alarm set (sidelying to R, pillow under LLE for comfort/neutral hip posture) ?Nurse Communication: Mobility status ?PT Visit Diagnosis: Unsteadiness on feet (R26.81);Muscle weakness (generalized) (M62.81) ?  ? ? ?Time: 6759-1638 ?PT Time Calculation (min) (ACUTE ONLY): 23 min ? ?Charges:  $Wheel Chair Management: 8-22 mins          ?          ? ?Shameika Speelman P., PTA ?Acute Rehabilitation Services ?Secure  Chat Preferred 9a-5:30pm ?Office: [phone]  ? ? ?Kara Pacer Aftyn Nott ?02/16/2022, 4:19 PM ? ?

## 2022-02-17 ENCOUNTER — Inpatient Hospital Stay (INDEPENDENT_AMBULATORY_CARE_PROVIDER_SITE_OTHER): Payer: Medicare Other | Admitting: Primary Care

## 2022-02-17 ENCOUNTER — Other Ambulatory Visit: Payer: Self-pay | Admitting: *Deleted

## 2022-02-17 DIAGNOSIS — E871 Hypo-osmolality and hyponatremia: Secondary | ICD-10-CM

## 2022-02-17 DIAGNOSIS — H543 Unqualified visual loss, both eyes: Secondary | ICD-10-CM | POA: Diagnosis not present

## 2022-02-17 DIAGNOSIS — E43 Unspecified severe protein-calorie malnutrition: Secondary | ICD-10-CM

## 2022-02-17 DIAGNOSIS — Z89611 Acquired absence of right leg above knee: Secondary | ICD-10-CM

## 2022-02-17 DIAGNOSIS — E440 Moderate protein-calorie malnutrition: Secondary | ICD-10-CM

## 2022-02-17 DIAGNOSIS — N186 End stage renal disease: Secondary | ICD-10-CM | POA: Diagnosis not present

## 2022-02-17 DIAGNOSIS — I70462 Atherosclerosis of autologous vein bypass graft(s) of the extremities with gangrene, left leg: Secondary | ICD-10-CM | POA: Diagnosis not present

## 2022-02-17 DIAGNOSIS — E1122 Type 2 diabetes mellitus with diabetic chronic kidney disease: Secondary | ICD-10-CM | POA: Diagnosis not present

## 2022-02-17 DIAGNOSIS — D631 Anemia in chronic kidney disease: Secondary | ICD-10-CM

## 2022-02-17 LAB — CBC WITH DIFFERENTIAL/PLATELET
Abs Immature Granulocytes: 0.07 10*3/uL (ref 0.00–0.07)
Basophils Absolute: 0.1 10*3/uL (ref 0.0–0.1)
Basophils Relative: 1 %
Eosinophils Absolute: 0.4 10*3/uL (ref 0.0–0.5)
Eosinophils Relative: 2 %
HCT: 28.6 % — ABNORMAL LOW (ref 36.0–46.0)
Hemoglobin: 9 g/dL — ABNORMAL LOW (ref 12.0–15.0)
Immature Granulocytes: 1 %
Lymphocytes Relative: 12 %
Lymphs Abs: 1.7 10*3/uL (ref 0.7–4.0)
MCH: 26.7 pg (ref 26.0–34.0)
MCHC: 31.5 g/dL (ref 30.0–36.0)
MCV: 84.9 fL (ref 80.0–100.0)
Monocytes Absolute: 1.9 10*3/uL — ABNORMAL HIGH (ref 0.1–1.0)
Monocytes Relative: 12 %
Neutro Abs: 11 10*3/uL — ABNORMAL HIGH (ref 1.7–7.7)
Neutrophils Relative %: 72 %
Platelets: 485 10*3/uL — ABNORMAL HIGH (ref 150–400)
RBC: 3.37 MIL/uL — ABNORMAL LOW (ref 3.87–5.11)
RDW: 18.9 % — ABNORMAL HIGH (ref 11.5–15.5)
WBC: 15.2 10*3/uL — ABNORMAL HIGH (ref 4.0–10.5)
nRBC: 0 % (ref 0.0–0.2)

## 2022-02-17 LAB — BASIC METABOLIC PANEL
Anion gap: 15 (ref 5–15)
BUN: 51 mg/dL — ABNORMAL HIGH (ref 6–20)
CO2: 24 mmol/L (ref 22–32)
Calcium: 10.4 mg/dL — ABNORMAL HIGH (ref 8.9–10.3)
Chloride: 94 mmol/L — ABNORMAL LOW (ref 98–111)
Creatinine, Ser: 7.97 mg/dL — ABNORMAL HIGH (ref 0.44–1.00)
GFR, Estimated: 5 mL/min — ABNORMAL LOW (ref >60)
Glucose, Bld: 134 mg/dL — ABNORMAL HIGH (ref 70–99)
Potassium: 4.2 mmol/L (ref 3.5–5.1)
Sodium: 133 mmol/L — ABNORMAL LOW (ref 135–145)

## 2022-02-17 LAB — CBC
HCT: 30.3 % — ABNORMAL LOW (ref 36.0–46.0)
Hemoglobin: 9.8 g/dL — ABNORMAL LOW (ref 12.0–15.0)
MCH: 27.5 pg (ref 26.0–34.0)
MCHC: 32.3 g/dL (ref 30.0–36.0)
MCV: 84.9 fL (ref 80.0–100.0)
Platelets: 453 10*3/uL — ABNORMAL HIGH (ref 150–400)
RBC: 3.57 MIL/uL — ABNORMAL LOW (ref 3.87–5.11)
RDW: 18.9 % — ABNORMAL HIGH (ref 11.5–15.5)
WBC: 14.8 10*3/uL — ABNORMAL HIGH (ref 4.0–10.5)
nRBC: 0 % (ref 0.0–0.2)

## 2022-02-17 LAB — GLUCOSE, CAPILLARY
Glucose-Capillary: 140 mg/dL — ABNORMAL HIGH (ref 70–99)
Glucose-Capillary: 131 mg/dL — ABNORMAL HIGH (ref 70–99)
Glucose-Capillary: 154 mg/dL — ABNORMAL HIGH (ref 70–99)
Glucose-Capillary: 172 mg/dL — ABNORMAL HIGH (ref 70–99)

## 2022-02-17 LAB — RENAL FUNCTION PANEL
Albumin: 2.4 g/dL — ABNORMAL LOW (ref 3.5–5.0)
Anion gap: 13 (ref 5–15)
BUN: 51 mg/dL — ABNORMAL HIGH (ref 6–20)
CO2: 23 mmol/L (ref 22–32)
Calcium: 10.1 mg/dL (ref 8.9–10.3)
Chloride: 95 mmol/L — ABNORMAL LOW (ref 98–111)
Creatinine, Ser: 7.9 mg/dL — ABNORMAL HIGH (ref 0.44–1.00)
GFR, Estimated: 6 mL/min — ABNORMAL LOW (ref >60)
Glucose, Bld: 137 mg/dL — ABNORMAL HIGH (ref 70–99)
Phosphorus: 4.7 mg/dL — ABNORMAL HIGH (ref 2.5–4.6)
Potassium: 4 mmol/L (ref 3.5–5.1)
Sodium: 131 mmol/L — ABNORMAL LOW (ref 135–145)

## 2022-02-17 MED ORDER — BOOST / RESOURCE BREEZE PO LIQD CUSTOM
1.0000 | Freq: Three times a day (TID) | ORAL | Status: DC
  Administered 2022-02-17 – 2022-02-19 (×5): 1 via ORAL

## 2022-02-17 MED ORDER — LORAZEPAM 2 MG/ML IJ SOLN
INTRAMUSCULAR | Status: AC
  Filled 2022-02-17: qty 1

## 2022-02-17 MED ORDER — LORAZEPAM 2 MG/ML IJ SOLN
0.5000 mg | Freq: Once | INTRAMUSCULAR | Status: AC
  Administered 2022-02-17: 0.5 mg via INTRAVENOUS

## 2022-02-17 NOTE — Progress Notes (Addendum)
[facility] ? ?Subjective  - no new complaints ? ? ?Objective ?(!) 160/58 ?78 ?98.8 ?F (37.1 ?C) (Oral) ?20 ?96% ? ?Intake/Output Summary (Last 24 hours) at 02/17/2022 0745 ?Last data filed at 02/16/2022 2000 ?Gross per 24 hour  ?Intake 240 ml  ?Output --  ?Net 240 ml  ? ? ?Improved inflow with doppler signals peroneal/PT in foot ?Minimal motor with second toe dry gangrene  ?Lungs non labored breathing ?General no acute distress ? ? ?Assessment/Planning: ?56 y.o. female is s/p left iliofemoral endarterectomy, left femoral to below-knee popliteal artery bypass with ipsilateral non-reversed GSV    ?Previous right AKA ? ?Improved inflow to left LE will observe demarcation of second toe Plan to be followed OP by DPM ?F/U with VVS arranged ?Pending discharge to SNF stable from a vascular point of view. ? ?Tammy Moses ?02/17/2022 ?7:45 AM ?-- ? ?VASCULAR STAFF ADDENDUM: ?I agree with the above.  ?Wounds healing appropriately.   ?Shyan will likely require some form of amputation of the toes due to dry gangrene.  I am hopeful her revascularization will help heal the wound bed once this occurs. ?[name] is very sick at baseline, with malnutrition, end-stage renal disease, diabetes.   ?I appreciate the coordinated care from hospital medicine, nephrology, social work and nursing ? ?[name], MD ?[facility] ?Office Phone Number: [phone] ?02/17/2022 2:21 PM ? ? ? ?Laboratory ?Lab Results: ?Recent Labs  ?  02/15/22 ?4481 02/16/22 ?0102  ?WBC 12.3* 17.9*  ?HGB 7.7* 10.2*  ?HCT 24.6* 31.5*  ?PLT 393 344  ? ?BMET ?Recent Labs  ?  02/15/22 ?0737  ?NA 129*  ?K 4.4  ?CL 91*  ?CO2 22  ?GLUCOSE 148*  ?BUN 69*  ?CREATININE 9.53*  ?CALCIUM 9.8  ? ? ?COAG ?Lab Results  ?Component Value Date  ? INR 1.2 10/21/2021  ? INR 1.2 09/16/2021  ? INR 0.88 01/30/2014  ? ?No results found for: PTT ? ? ? ?

## 2022-02-17 NOTE — Progress Notes (Signed)
Patient started to get restless and irritable. Refer to Stephania Fragmin PA and administer Ativan 0.5mg  IV now as single dose to calm patient but still agitated. Called Dr. Joelyn Oms because patient put her arms on her head while ongoing dialysis and with fistula needles in it. Protecting patient from potentially harming herself. ?

## 2022-02-17 NOTE — Procedures (Signed)
I was present at this dialysis session. I have reviewed the session itself and made appropriate changes.  ? ?2K bath UF goal 2L.  BPs stable / up at start of HD.   ? ?Hb down to 9.0, on Aranesp qMon.  ? ?Filed Weights  ? 02/15/22 0700 02/16/22 0520 02/17/22 0803  ?Weight: 45.7 kg 46.9 kg 46.5 kg  ? ? ?Recent Labs  ?Lab 02/15/22 ?7893 02/17/22 ?0728  ?NA 129* 133*  ?K 4.4 4.2  ?CL 91* 94*  ?CO2 22 24  ?GLUCOSE 148* 134*  ?BUN 69* 51*  ?CREATININE 9.53* 7.97*  ?CALCIUM 9.8 10.4*  ?PHOS 6.4*  --   ? ? ?Recent Labs  ?Lab 02/16/22 ?0102 02/17/22 ?8101 02/17/22 ?0813  ?WBC 17.9* 14.8* 15.2*  ?NEUTROABS  --   --  11.0*  ?HGB 10.2* 9.8* 9.0*  ?HCT 31.5* 30.3* 28.6*  ?MCV 83.6 84.9 84.9  ?PLT 344 453* 485*  ? ? ?Scheduled Meds: ? sodium chloride   Intravenous Once  ? amLODipine  10 mg Oral Daily  ? aspirin EC  81 mg Oral Daily  ? atorvastatin  80 mg Oral Daily  ? atropine  1 drop Both Eyes BID  ? Chlorhexidine Gluconate Cloth  6 each Topical Q0600  ? clopidogrel  75 mg Oral Q0600  ? darbepoetin (ARANESP) injection - DIALYSIS  60 mcg Intravenous Q Mon-HD  ? dorzolamide-timolol  1 drop Both Eyes BID  ? feeding supplement  1 Container Oral BID BM  ? furosemide  80 mg Oral 2 times per day on Sun Tue Thu Sat  ? heparin  5,000 Units Subcutaneous Q8H  ? hydrALAZINE  100 mg Oral Q8H  ? insulin aspart  0-6 Units Subcutaneous TID WC  ? latanoprost  1 drop Both Eyes QHS  ? losartan  100 mg Oral Daily  ? metoCLOPramide  10 mg Oral TID AC  ? metoprolol tartrate  50 mg Oral BID  ? multivitamin  1 tablet Oral QHS  ? pantoprazole  40 mg Oral Daily  ? polyethylene glycol  17 g Oral Daily  ? senna-docusate  2 tablet Oral Daily  ? sevelamer carbonate  2,400 mg Oral TID WC  ? ?Continuous Infusions: ?PRN Meds:.acetaminophen **OR** acetaminophen, albuterol, guaiFENesin-dextromethorphan, hydrALAZINE, labetalol, metoprolol tartrate, ondansetron, oxyCODONE-acetaminophen, phenol, senna-docusate, sevelamer carbonate   ?Pearson Grippe  MD ?02/17/2022,  8:42 AM ?  ?

## 2022-02-17 NOTE — Plan of Care (Signed)
?  Problem: Education: ?Goal: Understanding of CV disease, CV risk reduction, and recovery process will improve ?Outcome: Progressing ?Goal: Individualized Educational Video(s) ?Outcome: Progressing ?  ?Problem: Activity: ?Goal: Ability to return to baseline activity level will improve ?Outcome: Progressing ?  ?Problem: Cardiovascular: ?Goal: Ability to achieve and maintain adequate cardiovascular perfusion will improve ?Outcome: Progressing ?Goal: Vascular access site(s) Level 0-1 will be maintained ?Outcome: Progressing ?  ?Problem: Health Behavior/Discharge Planning: ?Goal: Ability to safely manage health-related needs after discharge will improve ?Outcome: Progressing ?  ?Problem: Skin Integrity: ?Goal: Risk for impaired skin integrity will decrease ?Outcome: Progressing ?  ?Problem: Pain Managment: ?Goal: General experience of comfort will improve ?Outcome: Progressing ?  ?Problem: Clinical Measurements: ?Goal: Complications related to the disease process, condition or treatment will be avoided or minimized ?Outcome: Progressing ?  ?Problem: Fluid Volume: ?Goal: Compliance with measures to maintain balanced fluid volume will improve ?Outcome: Progressing ?  ?

## 2022-02-17 NOTE — Progress Notes (Signed)
?PROGRESS NOTE ? ? ? Tammy Moses  UMP:[id number] DOB: 1966-06-08 DOA: 02/04/2022 ?PCP: [name], FNP  ? ? ?Brief Narrative:  ?Tammy Moses is a 56 y.o. female with past medical history of hypertension, diabetes mellitus, end-stage renal disease on hemodialysis, status post above-knee amputation 2 months back presented to the hospital with complaints of severe hypertension during her preop work-up.  Of note patient had AKA on right  in February and was discharged to skilled nursing facility.  Patient did have a follow-up appointment with vascular surgery in April when she was noted to have new right gangrene of the left great toe.  Patient was then scheduled for debridement of the mild dehiscence of the right open amputation stump as well as left lower extremity angiogram.  On the day of surgery unfortunately blood pressure was 400 systolic so she was brought into the hospital. ? ?Sequence of events. ? 4/13: Sent from preop for admission due to severe HTN >> admitted to ICU on Cleviprex ?4/14: BP self corrected rapidly, put back on oral agents, transferred to floor ?4/17: Vascular surgery consulted, unclear delay ?4/19: Angiography of left leg completed, bypass offered ?4/20: LEFT fem-pop bypass by Dr. Virl Cagey ?4/21-23: Given hygiene concerns, family concerns -> SNF placement sought, of note, patient with waxing and waning disorientation ?4/24: Transfused 1 unit PRBCs ?4/25: Palliative care consulted in setting of persistent delirium, concern for failure to thrive ?    ?Assessment and Plan: ?Principal Problem: ?  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  Hypertensive urgency ?  Delirium ?  Blindness ?  Hypertension ?  Controlled type 2 diabetes mellitus with chronic kidney disease on chronic dialysis ([place]) ?  Anemia in chronic kidney disease (CKD) ?  ESRD (end stage renal disease) ([place]) -  Dialyzes at [facility].  On Monday  Wednesday Friday. ?  Peripheral arterial disease ([place]) ?  S/P AKA (above knee amputation) unilateral, right ([place]) ?  Malnutrition of moderate degree ?  Hyponatremia ?  ?* Critical limb ischemia of left lower extremity with gangrene  ?Peripheral arterial disease ?S/p fem-pop bypass 4/20 by Dr. Virl Cagey ?At this time patient is awaiting for skilled nursing facility placement.  Will need to follow-up with Dr. Unk Lightning vascular surgery after discharge.  Continue aspirin Plavix and Lipitor. ?  ?Hypertensive urgency/Essential hypertension ?She does have hypotensive episodes during dialysis so has been difficult to titrate antihypertensives at this time.  Nephrology aware.  Currently on amlodipine, Lasix, metoprolol, hydralazine and losartan. ?  ? Delirium ?Has had intermittent episodes of confusion several weeks prior to this presentation.  Palliative ?care has been consulted due to malnutrition, persistent delirium with hypotension during dialysis and possibly ongoing slow decline. ?  ?Malnutrition of moderate degree ?As evidenced by moderate loss of subcutaneous fat and severe loss of subcutaneous muscle mass in the setting of end-stage renal disease ?  ?S/P AKA (above knee amputation) unilateral, right ([place]) ?With wound dehiscence.  At this time the left above-knee amputation stump has improved with antibiotics and healing well   ?  ?ESRD (end stage renal disease) ([place]) -  Dialyzes at [facility].  On Monday Wednesday Friday. ?Nephrology on board for hemodialysis needs. ? ?Anemia of chronic kidney disease (CKD) ?Baseline Hgb 9-11.  Latest hemoglobin of 9.0.  Received 1 unit of packed RBC during hospitalization. Aranesp and Iron per nephrology ?  ?Controlled type 2 diabetes mellitus with chronic kidney disease on  chronic dialysis  ?Continue sliding scale insulin.  We will continue to monitor. ?  ? Blindness.  Supportive care ? ?Ethics/goals of care.  Patient appears to have delirium and failure to thrive with ongoing  vascular issues on dialysis.  Overall prognosis seems to be guarded/poor.  Palliative care has been consulted for ongoing discussion.       ?   ? DVT prophylaxis: heparin injection 5,000 Units Start: 02/12/22 0600 ? ? ?Code Status:   ?  Code Status: Full Code ? ?Disposition: Skilled nursing facility ? ?Status is: Inpatient ? ?Remains inpatient appropriate because: Ongoing delirium, status post vascular intervention, ? ? Family Communication: None today. ? ?Consultants:  ?Vascular surgery ?Nephrology ?Critical care ?Palliative care ? ?Procedures:  ?Hemodialysis ?Left iliofemoral endarterectomy, left bypass graft femoral to popliteal artery on 02/11/2022 ? ?Antimicrobials:  ?None ? ?Anti-infectives (From admission, onward)  ? ? Start     Dose/Rate Route Frequency Ordered Stop  ? 02/11/22 2130  ceFAZolin (ANCEF) IVPB 2g/100 mL premix  Status:  Discontinued       ? 2 g ?200 mL/hr over 30 Minutes Intravenous Every 8 hours 02/11/22 2032 02/11/22 2040  ? 02/04/22 0735  ceFAZolin (ANCEF) 2-4 GM/100ML-% IVPB       ?Note to Pharmacy: [facility], Alvis Lemmings: cabinet override  ?    02/04/22 0735 02/04/22 1944  ? 02/04/22 0732  ceFAZolin (ANCEF) IVPB 2g/100 mL premix  Status:  Discontinued       ? 2 g ?200 mL/hr over 30 Minutes Intravenous 30 min pre-op 02/04/22 0732 02/04/22 1201  ? ?  ? ?Subjective: ?Today, patient was seen and examined at bedside.  Patient was seen during hemodialysis.  Confused disoriented.  Hemodialysis nurse at bedside trying to calm the patient down.  Trying to pull the dialysis catheter line ? ?Objective: ?Vitals:  ? 02/17/22 1030 02/17/22 1056 02/17/22 1148 02/17/22 1154  ?BP: (!) 102/43 (!) 118/39 (!) 100/57 104/63  ?Pulse: 83 87 83 87  ?Resp:  18 14 20   ?Temp:  (!) 97.5 ?F (36.4 ?C) 99.3 ?F (37.4 ?C) 99.3 ?F (37.4 ?C)  ?TempSrc:  Temporal Oral Oral  ?SpO2:  100% 96% 96%  ?Weight:  45.2 kg    ?Height:      ? ? ?Intake/Output Summary (Last 24 hours) at 02/17/2022 1257 ?Last data filed at 02/17/2022 1056 ?Gross per  24 hour  ?Intake 240 ml  ?Output 1355 ml  ?Net -1115 ml  ? ?Filed Weights  ? 02/16/22 0520 02/17/22 0803 02/17/22 1056  ?Weight: 46.9 kg 46.5 kg 45.2 kg  ? ? ?Physical Examination: ?Body mass index is 18.83 kg/m?.  ? ?General: Thinly built, not in obvious distress appears frail deconditioned weak and confused/agitated at times ?HENT:   No scleral pallor or icterus noted. Oral mucosa is moist.  ?Chest:  Clear breath sounds.  Diminished breath sounds bilaterally. No crackles or wheezes.  ?CVS: S1 &S2 heard. No murmur.  Regular rate and rhythm. ?Abdomen: Soft, nontender, nondistended.  Bowel sounds are heard.   ?Extremities: No cyanosis, clubbing or edema.  Peripheral pulses are palpable.  Left lower extremity with staples from recent vascular surgery.  Right above-knee amputation. ?Psych: Confused, disoriented agitated at times trying to pull the line. ?CNS:  No cranial nerve deficits.  Power equal in all extremities.   ?Skin: Warm and dry.  No rashes noted. ? ?Data Reviewed:  ? ?CBC: ?Recent Labs  ?Lab 02/12/22 ?0123 02/15/22 ?4627 02/16/22 ?0102 02/17/22 ?0350 02/17/22 ?0938  ?WBC 16.6* 12.3*  17.9* 14.8* 15.2*  ?NEUTROABS  --   --   --   --  11.0*  ?HGB 10.3* 7.7* 10.2* 9.8* 9.0*  ?HCT 34.9* 24.6* 31.5* 30.3* 28.6*  ?MCV 85.3 82.8 83.6 84.9 84.9  ?PLT 351 393 344 453* 485*  ? ? ?Basic Metabolic Panel: ?Recent Labs  ?Lab 02/11/22 ?4103 02/12/22 ?0123 02/15/22 ?0131 02/17/22 ?4388 02/17/22 ?0813  ?NA 129* 134* 129* 133* 131*  ?K 4.0 5.5* 4.4 4.2 4.0  ?CL 92* 96* 91* 94* 95*  ?CO2 22 23 22 24 23   ?GLUCOSE 147* 132* 148* 134* 137*  ?BUN 45* 59* 69* 51* 51*  ?CREATININE 6.20* 7.87* 9.53* 7.97* 7.90*  ?CALCIUM 9.5 9.6 9.8 10.4* 10.1  ?PHOS  --   --  6.4*  --  4.7*  ? ? ?Liver Function Tests: ?Recent Labs  ?Lab 02/15/22 ?8757 02/17/22 ?0813  ?ALBUMIN 2.3* 2.4*  ? ? ? ?Radiology Studies: ?No results found. ? ? ? LOS: 13 days  ? ? ?Flora Lipps, MD ?[facility] ?Available via Epic secure chat 7am-7pm ?After these  hours, please refer to coverage provider listed on [email or web address] ?02/17/2022, 12:57 PM  ? ? ?

## 2022-02-17 NOTE — Progress Notes (Signed)
Contacted by CSW with request that pt changed to an out-pt HD clinic in [place] while pt at [facility] for rehab. SNF is requesting [place] or [place]. [facility] is currently showing full at this time. Will proceed with referral to [facility] admissions for chair at [facility]. Update provided to CSW. Will assist as needed.  ? ?Tammy Moses ?Renal Navigator ?[phone] ?

## 2022-02-17 NOTE — Patient Outreach (Signed)
[facility]) Care Management ? ?02/17/2022 ? ?Melvia Heaps ?[birth year] ?[id number] ? ?Case closure ? ?RN unable to contact pt who readmitted into the hospital setting and remains inpt status >10 days. ? ?Hospital liaison aware with no further contacts from this community care manager at this time. ? ?Raina Mina, RN ?Care Management Coordinator ?[facility] ?Main Office [phone]  ? ?

## 2022-02-17 NOTE — Progress Notes (Signed)
Patient off floor for treatment  

## 2022-02-17 NOTE — Progress Notes (Addendum)
Nutrition Follow-up ? ?DOCUMENTATION CODES:  ? ?Non-severe (moderate) malnutrition in context of chronic illness ? ?INTERVENTION:  ? ?Increase Boost Breeze to TID, each supplement provides 250 kcal and 9 grams of protein. ? ?Continue Rena-vit daily. ? ?Continue scheduled bowel regimen.  ? ?NUTRITION DIAGNOSIS:  ? ?Moderate Malnutrition related to chronic illness (ESRD on HD) as evidenced by moderate muscle depletion, severe muscle depletion, mild fat depletion, moderate fat depletion. ? ?Ongoing  ? ?GOAL:  ? ?Patient will meet greater than or equal to 90% of their needs ? ?Progressing  ? ?MONITOR:  ? ?PO intake, Supplement acceptance, Skin, Labs ? ?REASON FOR ASSESSMENT:  ? ?Malnutrition Screening Tool, Consult ?Assessment of nutrition requirement/status ? ?ASSESSMENT:  ? 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 Developed severe HTN pre-op and was admitted to the ICU on 4/13. PMH includes ESRD on HD, HTN, DM, PVD, R AKA, blind R eye, chronic constipation, depression, neuropathy, retinopathy, GERD, tobacco dependence, vitamin D deficiency. ? ?4/20 S/P L fem-pop bypass; per VVS notes, patient with improved inflow to the LLE; observing demarcation of second toe.  ? ?S/P HD this morning.  ?New EDW ~45 kg per Nephrology note. ?Currently 45.2 kg. ? ?Patient is awaiting SNF placement. ?Palliative care team has been consulted.  ? ?Currently on a renal diet with 1200 ml fluid restriction.  ?Meal intakes documented at 85-100% x 2 meals on 4/24.  ?She is also being offered Boost Breeze PO BID between meals. Documented given twice on 4/22 and 4/23, once on 4/24 and 4/25. It was not give this morning because she was at HD.  ? ?Labs reviewed. Na 131, Phos 4.7 ?CBG: 140-131 ? ?Medications reviewed and include Aranesp once weekly, Lasix, Novolog, Reglan, Rena-vit, Miralax, Senokot-S, Renvela. ? ? ?Diet Order:   ?Diet Order   ? ?       ?  Diet renal with fluid restriction Fluid  restriction: 1200 mL Fluid; Room service appropriate? Yes with Assist; Fluid consistency: Thin  Diet effective now       ?  ? ?  ?  ? ?  ? ? ?EDUCATION NEEDS:  ? ?No education needs have been identified at this time ? ?Skin:  Skin Assessment: Skin Integrity Issues: ?Skin Integrity Issues:: Diabetic Ulcer ?Diabetic Ulcer: great toe on left foot ? ?Last BM:  4/26 type 1 ? ?Height:  ? ?Ht Readings from Last 1 Encounters:  ?02/11/22 5\' 1"  (1.549 m)  ? ? ?Weight:  ? ?Wt Readings from Last 1 Encounters:  ?02/17/22 45.2 kg  ? ? ?BMI:  Body mass index is 18.83 kg/m?. ? ?Estimated Nutritional Needs:  ? ?Kcal:  1500-1700 ? ?Protein:  70-80 gm ? ?Fluid:  1 L + UOP ? ? ? ?Lucas Mallow RD, LDN, CNSC ?Please refer to Amion for contact information.                                                       ? ?

## 2022-02-18 DIAGNOSIS — Z789 Other specified health status: Secondary | ICD-10-CM

## 2022-02-18 DIAGNOSIS — Z66 Do not resuscitate: Secondary | ICD-10-CM

## 2022-02-18 DIAGNOSIS — Z515 Encounter for palliative care: Secondary | ICD-10-CM

## 2022-02-18 DIAGNOSIS — Z7189 Other specified counseling: Secondary | ICD-10-CM

## 2022-02-18 LAB — BASIC METABOLIC PANEL
Anion gap: 13 (ref 5–15)
BUN: 28 mg/dL — ABNORMAL HIGH (ref 6–20)
CO2: 26 mmol/L (ref 22–32)
Calcium: 9.9 mg/dL (ref 8.9–10.3)
Chloride: 98 mmol/L (ref 98–111)
Creatinine, Ser: 5.31 mg/dL — ABNORMAL HIGH (ref 0.44–1.00)
GFR, Estimated: 9 mL/min — ABNORMAL LOW (ref >60)
Glucose, Bld: 197 mg/dL — ABNORMAL HIGH (ref 70–99)
Potassium: 3.8 mmol/L (ref 3.5–5.1)
Sodium: 137 mmol/L (ref 135–145)

## 2022-02-18 LAB — CBC
HCT: 30.6 % — ABNORMAL LOW (ref 36.0–46.0)
Hemoglobin: 9.5 g/dL — ABNORMAL LOW (ref 12.0–15.0)
MCH: 26.7 pg (ref 26.0–34.0)
MCHC: 31 g/dL (ref 30.0–36.0)
MCV: 86 fL (ref 80.0–100.0)
Platelets: 460 10*3/uL — ABNORMAL HIGH (ref 150–400)
RBC: 3.56 MIL/uL — ABNORMAL LOW (ref 3.87–5.11)
RDW: 18.9 % — ABNORMAL HIGH (ref 11.5–15.5)
WBC: 11.9 10*3/uL — ABNORMAL HIGH (ref 4.0–10.5)
nRBC: 0 % (ref 0.0–0.2)

## 2022-02-18 LAB — GLUCOSE, CAPILLARY
Glucose-Capillary: 140 mg/dL — ABNORMAL HIGH (ref 70–99)
Glucose-Capillary: 243 mg/dL — ABNORMAL HIGH (ref 70–99)
Glucose-Capillary: 117 mg/dL — ABNORMAL HIGH (ref 70–99)
Glucose-Capillary: 204 mg/dL — ABNORMAL HIGH (ref 70–99)

## 2022-02-18 LAB — MAGNESIUM: Magnesium: 2.2 mg/dL (ref 1.7–2.4)

## 2022-02-18 NOTE — Progress Notes (Signed)
Nephrology Follow-Up Consult note ? ? ?Assessment/Recommendations: Tammy Moses is a/an 56 y.o. female with a past medical history significant for ESRD on HD, PAD, DM2, HTN initially presented for outpatient elective wound debridement and angio however was found to have severe uncontrolled HTN.  ?  ?# ESRD:  ?-outpatient orders: [facility].  MWF.  [id number].  3.5 hours.  350/600.  2K, 2.5 Cal.  LUE AVF.  Heparin 1500 units bolus, 500 units maintenance.  Venofer 50 mg q. treatment, EPO 11,800 units, Sensipar 30 q. Mondays.  ?Has transitioned to [facility] while in SNF in [place] ?Therefore, will plan for Next HD on 4/29 to be on schedule, rec HD 4/26 and 4/23 this week.  ?New EDW probably around 45kg ?  ?# Volume/ severe hypertension: EDW ~45kg. she is no longer tolerating much UF ?-Continue labetalol and hydral, losartan, Lasix.   ?  ?# PAD s/p right AKA w/ nonhealing wound/gangrene, leukocytosis ?-recent rt AKA after unsuccessful revascularization  ?- s/p left fem below knee pop bypass with ipsilateral non reversed GSV + left iliofemoral endarterectomy wit hVVS ?-abx per primary service if indicated ?  ?# Anemia of Chronic Kidney Disease: Hemoglobin 9-10 on ESA;  Tranfsued 4/24 ?  ?# Secondary Hyperparathyroidism/Hyperphosphatemia: resumed home binders  sevelamer 3 tabs TIDM; P Ok ?  ?# Vascular access: LUE BCF ? ? ?# Additional recommendations: ?- Dose all meds for creatinine clearance < 10 ml/min  ?- Unless absolutely necessary, no MRIs with gadolinium.  ?- Implement save arm precautions.  Prefer needle sticks in the dorsum of the hands or wrists.  No blood pressure measurements in left arm. ?- If blood transfusion is requested during hemodialysis sessions, please alert [place] prior to the session.  ? ? ?Rexene Agent ?[facility] ?02/18/2022 ?11:55 AM ? ?___________________________________________________________ ? ?CC: ESRD ? ?Interval History/Subjective:  ?Seen in room no c/o ?For SNF at DC and  will req transfer to a HD unit in [place] ?HD yesterday: 1.4L UF ? ?Medications:  ?Current Facility-Administered Medications  ?Medication Dose Route Frequency Provider Last Rate Last Admin  ? 0.9 %  sodium chloride infusion (Manually program via Guardrails IV Fluids)   Intravenous Once [place], Suann Larry, MD      ? acetaminophen (TYLENOL) tablet 325-650 mg  325-650 mg Oral Q4H PRN Dagoberto Ligas, PA-C   650 mg at 02/16/22 1245  ? Or  ? acetaminophen (TYLENOL) suppository 325-650 mg  325-650 mg Rectal Q4H PRN Dagoberto Ligas, PA-C      ? albuterol (PROVENTIL) (2.5 MG/3ML) 0.083% nebulizer solution 3 mL  3 mL Inhalation Q4H PRN Dagoberto Ligas, PA-C      ? amLODipine (NORVASC) tablet 10 mg  10 mg Oral Daily Dagoberto Ligas, PA-C   10 mg at 02/18/22 8099  ? aspirin EC tablet 81 mg  81 mg Oral Daily Dagoberto Ligas, PA-C   81 mg at 02/18/22 8338  ? atorvastatin (LIPITOR) tablet 80 mg  80 mg Oral Daily Dagoberto Ligas, PA-C   80 mg at 02/18/22 2505  ? atropine 1 % ophthalmic solution 1 drop  1 drop Both Eyes BID Dagoberto Ligas, PA-C   1 drop at 02/18/22 0840  ? Chlorhexidine Gluconate Cloth 2 % PADS 6 each  6 each Topical Q0600 Dagoberto Ligas, PA-C   6 each at 02/18/22 0840  ? clopidogrel (PLAVIX) tablet 75 mg  75 mg Oral Q0600 Dagoberto Ligas, PA-C   75 mg at 02/18/22 0505  ? Darbepoetin Alfa (ARANESP) injection 60  mcg  60 mcg Intravenous Q Mon-HD Rexene Agent, MD   60 mcg at 02/15/22 0946  ? dorzolamide-timolol (COSOPT) 22.3-6.8 MG/ML ophthalmic solution 1 drop  1 drop Both Eyes BID Dagoberto Ligas, PA-C   1 drop at 02/18/22 9211  ? feeding supplement (BOOST / RESOURCE BREEZE) liquid 1 Container  1 Container Oral TID BM Flora Lipps, MD   1 Container at 02/18/22 0851  ? furosemide (LASIX) tablet 80 mg  80 mg Oral 2 times per day on Sun Tue Thu Sat Dagoberto Ligas, PA-C   80 mg at 02/18/22 9417  ? guaiFENesin-dextromethorphan (ROBITUSSIN DM) 100-10 MG/5ML syrup 15 mL  15 mL Oral Q4H PRN Dagoberto Ligas, PA-C      ? heparin injection 5,000 Units  5,000 Units Subcutaneous Q8H Dagoberto Ligas, PA-C   5,000 Units at 02/18/22 0505  ? hydrALAZINE (APRESOLINE) injection 10 mg  10 mg Intravenous Q8H PRN Dagoberto Ligas, PA-C   10 mg at 02/05/22 1119  ? hydrALAZINE (APRESOLINE) tablet 100 mg  100 mg Oral Q8H Danford, Suann Larry, MD   100 mg at 02/18/22 0505  ? insulin aspart (novoLOG) injection 0-6 Units  0-6 Units Subcutaneous TID WC Dagoberto Ligas, PA-C   1 Units at 02/17/22 1711  ? labetalol (NORMODYNE) injection 10 mg  10 mg Intravenous Q2H PRN Dagoberto Ligas, PA-C   10 mg at 02/14/22 1815  ? latanoprost (XALATAN) 0.005 % ophthalmic solution 1 drop  1 drop Both Eyes QHS Dagoberto Ligas, PA-C   1 drop at 02/16/22 2355  ? losartan (COZAAR) tablet 100 mg  100 mg Oral Daily Dagoberto Ligas, PA-C   100 mg at 02/18/22 4081  ? metoCLOPramide (REGLAN) tablet 10 mg  10 mg Oral TID AC Dagoberto Ligas, PA-C   10 mg at 02/18/22 4481  ? metoprolol tartrate (LOPRESSOR) injection 2-5 mg  2-5 mg Intravenous Q2H PRN Dagoberto Ligas, PA-C      ? metoprolol tartrate (LOPRESSOR) tablet 50 mg  50 mg Oral BID Dagoberto Ligas, PA-C   50 mg at 02/18/22 8563  ? multivitamin (RENA-VIT) tablet 1 tablet  1 tablet Oral QHS Dagoberto Ligas, PA-C   1 tablet at 02/17/22 2153  ? ondansetron (ZOFRAN) injection 4 mg  4 mg Intravenous Q6H PRN Dagoberto Ligas, PA-C      ? oxyCODONE-acetaminophen (PERCOCET/ROXICET) 5-325 MG per tablet 1-2 tablet  1-2 tablet Oral Q4H PRN Dagoberto Ligas, PA-C   2 tablet at 02/18/22 0505  ? pantoprazole (PROTONIX) EC tablet 40 mg  40 mg Oral Daily Dagoberto Ligas, PA-C   40 mg at 02/18/22 1497  ? phenol (CHLORASEPTIC) mouth spray 1 spray  1 spray Mouth/Throat PRN Dagoberto Ligas, PA-C      ? polyethylene glycol (MIRALAX / GLYCOLAX) packet 17 g  17 g Oral Daily Edwin Dada, MD   17 g at 02/18/22 0851  ? senna-docusate (Senokot-S) tablet 1 tablet  1 tablet Oral Daily PRN Dagoberto Ligas,  PA-C   1 tablet at 02/13/22 2114  ? senna-docusate (Senokot-S) tablet 2 tablet  2 tablet Oral Daily Edwin Dada, MD   2 tablet at 02/18/22 0263  ? sevelamer carbonate (RENVELA) tablet 2,400 mg  2,400 mg Oral TID WC Dagoberto Ligas, PA-C   2,400 mg at 02/18/22 7858  ? sevelamer carbonate (RENVELA) tablet 800 mg  800 mg Oral PRN Dagoberto Ligas, PA-C   800 mg at 02/05/22 1635  ?  ? ? ?Review of Systems: ?10 systems reviewed and negative except per  interval history/subjective ? ?Physical Exam: ?Vitals:  ? 02/18/22 0843 02/18/22 1128  ?BP: (!) 179/51 (!) 147/41  ?Pulse: 70   ?Resp: 16 16  ?Temp: 97.9 ?F (36.6 ?C) 98.5 ?F (36.9 ?C)  ?SpO2: 96% 98%  ? ?No intake/output data recorded. ?No intake or output data in the 24 hours ending 02/18/22 1155 ? ? ?Constitutional: Chronically ill-appearing, no distress ?ENMT: ears and nose without scars or lesions, MMM ?CV: normal rate, no audible rub ?Respiratory: Bilateral chest rise, normal work of breathing ?Gastrointestinal: soft, nondistended ?Psych: alert, appropriate mood and affect ?Access: LUA BCF, no infiltration ? ? ?Test Results ?I personally reviewed new and old clinical labs and radiology tests ?Lab Results  ?Component Value Date  ? NA 137 02/18/2022  ? K 3.8 02/18/2022  ? CL 98 02/18/2022  ? CO2 26 02/18/2022  ? BUN 28 (H) 02/18/2022  ? CREATININE 5.31 (H) 02/18/2022  ? CALCIUM 9.9 02/18/2022  ? ALBUMIN 2.4 (L) 02/17/2022  ? PHOS 4.7 (H) 02/17/2022  ? ? ?CBC ?Recent Labs  ?Lab 02/17/22 ?0728 02/17/22 ?0813 02/18/22 ?4600  ?WBC 14.8* 15.2* 11.9*  ?NEUTROABS  --  11.0*  --   ?HGB 9.8* 9.0* 9.5*  ?HCT 30.3* 28.6* 30.6*  ?MCV 84.9 84.9 86.0  ?PLT 453* 485* 460*  ? ? ? ? ? ? ?

## 2022-02-18 NOTE — Progress Notes (Addendum)
[facility] [place] ? ?Subjective  - no new complaint ? ? ?Objective ?(!) 145/95 ?72 ?98.8 ?F (37.1 ?C) (Oral) ?13 ?100% ? ?Intake/Output Summary (Last 24 hours) at 02/18/2022 0815 ?Last data filed at 02/17/2022 1056 ?Gross per 24 hour  ?Intake --  ?Output 1355 ml  ?Net -1355 ml  ? ? ?Doppler signals weak PT intact peroneal best signal and DP  ?Minimal motor with second toe dry gangrene will observe  ?Incision are all healing well and leg is soft without edema, groin soft  ?Lungs non labored breathing ?General no acute distress ? ?Assessment/Planning: ?56 y.o. female is s/p left iliofemoral endarterectomy, left femoral to below-knee popliteal artery bypass with ipsilateral non-reversed GSV    ?Previous right AKA ? ?Improved inflow to left LE will observe demarcation of second toe Plan to be followed OP by DPM.   ?F/U with VVS arranged ?Pending discharge to SNF stable from a vascular point of view. ? ? ?[name] ?02/18/2022 ?8:15 AM ?-- ?VASCULAR STAFF ADDENDUM: ?I have independently interviewed and examined the patient. ?I agree with the above.  ?Pt working with OT/ PT ?Wounds healing.  ?No change in dry gangrene ? ? ?[name], MD ?[facility] ?Office Phone Number: [phone] ?02/18/2022 2:09 PM ? ? ?Laboratory ?Lab Results: ?Recent Labs  ?  02/17/22 ?0813 02/18/22 ?3646  ?WBC 15.2* 11.9*  ?HGB 9.0* 9.5*  ?HCT 28.6* 30.6*  ?PLT 485* 460*  ? ?BMET ?Recent Labs  ?  02/17/22 ?0813 02/18/22 ?8032  ?NA 131* 137  ?K 4.0 3.8  ?CL 95* 98  ?CO2 23 26  ?GLUCOSE 137* 197*  ?BUN 51* 28*  ?CREATININE 7.90* 5.31*  ?CALCIUM 10.1 9.9  ? ? ?COAG ?Lab Results  ?Component Value Date  ? INR 1.2 10/21/2021  ? INR 1.2 09/16/2021  ? INR 0.88 01/30/2014  ? ?No results found for: PTT ? ? ? ?

## 2022-02-18 NOTE — Progress Notes (Addendum)
? ?PROGRESS NOTE ? ?Tammy Moses UKG:[id number] DOB: April 02, 1966 DOA: 02/04/2022 ?PCP: [name], FNP ? ?HPI/Recap of past 24 hours: ? Tammy Moses is a 56 y.o. female with past medical history of hypertension, diabetes mellitus, end-stage renal disease on hemodialysis, status post above-knee amputation 2 months back presented to the hospital with complaints of severe hypertension during her preop work-up.  Of note patient had AKA on right  in February and was discharged to skilled nursing facility.  Patient did have a follow-up appointment with vascular surgery in April when she was noted to have new right gangrene of the left great toe.  Patient was then scheduled for debridement of the mild dehiscence of the right open amputation stump as well as left lower extremity angiogram.  On the day of surgery unfortunately blood pressure was 762 systolic so she was brought into the hospital. ?  ?Sequence of events. ? 4/13: Sent from preop for admission due to severe HTN >> admitted to ICU on Cleviprex ?4/14: BP self corrected rapidly, put back on oral agents, transferred to floor ?4/17: Vascular surgery consulted, unclear delay ?4/19: Angiography of left leg completed, bypass offered ?4/20: LEFT fem-pop bypass by Dr. Virl Cagey ?4/21-23: Given hygiene concerns, family concerns -> SNF placement sought, of note, patient with waxing and waning disorientation ?4/24: Transfused 1 unit PRBCs ?4/25: Palliative care consulted in setting of persistent delirium, concern for failure to thrive ?    ?02/18/2022: Patient was seen and examined at bedside.  She has no new complaints. ? ?Assessment/Plan: ?Principal Problem: ?  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  Hypertensive urgency ?  Delirium ?  Blindness ?  Hypertension ?  Controlled type 2 diabetes mellitus with chronic kidney disease on chronic dialysis ([place]) ?  Anemia in chronic kidney disease  (CKD) ?  ESRD (end stage renal disease) ([place]) -  Dialyzes at [facility].  On Monday Wednesday Friday. ?  Peripheral arterial disease ([place]) ?  S/P AKA (above knee amputation) unilateral, right ([place]) ?  Malnutrition of moderate degree ?  Hyponatremia ?  ?Critical limb ischemia of left lower extremity with gangrene  ?Peripheral arterial disease ?S/p fem-pop bypass 4/20 by Dr. Virl Cagey ?At this time patient is awaiting for skilled nursing facility placement versus going home.  Will need to follow-up with Dr. Unk Lightning vascular surgery after discharge.  Continue aspirin Plavix and Lipitor. ? ?New ESRD on HD MWF ?Management per nephrology ?Patient currently has an HD spot ?Volume status and electrolytes monitor with hemodialysis. ?  ?Resolved hypertensive urgency/Essential hypertension ?She does have hypotensive episodes during dialysis so has been difficult to titrate antihypertensives at this time.  Nephrology aware.  Currently on amlodipine, Lasix, metoprolol, hydralazine and losartan. ?  ?Resolved delirium ?Has had intermittent episodes of confusion several weeks prior to this presentation.  Palliative ?care has been consulted due to malnutrition, persistent delirium with hypotension during dialysis and possibly ongoing slow decline. ? ?Resolved hyponatremia ?Serum sodium normalized from 131. ?Continue to monitor electrolytes and replete as indicated. ?  ?Malnutrition of moderate degree ?As evidenced by moderate loss of subcutaneous fat and severe loss of subcutaneous muscle mass in the setting of end-stage renal disease ?Continue to encourage increase in oral protein calorie intake. ?  ?S/P AKA (above knee amputation) unilateral, right ([place]) ?With wound dehiscence.  At this time the left above-knee amputation stump has improved with antibiotics and healing well   ?  ?ESRD (  end stage renal disease) ([place]) -  Dialyzes at [facility].  On Monday Wednesday Friday. ?Nephrology on board for hemodialysis needs. ?Patient  has an HD spot ?  ?Anemia of chronic kidney disease (CKD) ?Baseline Hgb 9-11.  Latest hemoglobin of 9.0.  Received 1 unit of packed RBC during hospitalization. Aranesp and Iron per nephrology ?  ?Controlled type 2 diabetes mellitus with chronic kidney disease on chronic dialysis  ?Continue sliding scale insulin.  We will continue to monitor. ?Hemoglobin A1c 5.5 on 11/15/2021. ?  ? Blindness.  Supportive care ?  ?Ethics/goals of care.  Patient appears to have delirium and failure to thrive with ongoing vascular issues on dialysis.  Overall prognosis seems to be guarded/poor.  Palliative care has been consulted for ongoing discussion.       ?   ? DVT prophylaxis: heparin injection 5,000 Units Start: 02/12/22 0600 ?  ?  ?Code Status:   ?  Code Status: Full Code ?  ?Disposition: Skilled nursing facility ?  ?Status is: Inpatient ?  ?Remains inpatient appropriate because: Ongoing delirium, status post vascular intervention, ?  ? Family Communication: None at bedside. ?  ?Consultants:  ?Vascular surgery ?Nephrology ?Critical care ?Palliative care ?  ?Procedures:  ?Hemodialysis ?Left iliofemoral endarterectomy, left bypass graft femoral to popliteal artery on 02/11/2022 ?  ? ? ?Status is: Inpatient ?Patient requires at least 2 midnights for further evaluation and treatment of present condition. ? ? ? ?Objective: ?Vitals:  ? 02/18/22 1128 02/18/22 1203 02/18/22 1300 02/18/22 1500  ?BP: (!) 147/41 (!) 159/42 (!) 109/37 (!) 140/55  ?Pulse:  79  77  ?Resp: 16 16    ?Temp: 98.5 ?F (36.9 ?C) 98 ?F (36.7 ?C)    ?TempSrc: Oral Oral    ?SpO2: 98% (!) 82% 96%   ?Weight:      ?Height:      ? ?No intake or output data in the 24 hours ending 02/18/22 1602 ?Filed Weights  ? 02/16/22 0520 02/17/22 0803 02/17/22 1056  ?Weight: 46.9 kg 46.5 kg 45.2 kg  ? ? ?Exam: ? ?General: 56 y.o. year-old female frail-appearing in no acute distress.  Alert and oriented x3. ?Cardiovascular: Regular rate and rhythm with no rubs or gallops.  No thyromegaly or  JVD noted.   ?Respiratory: Clear to auscultation with no wheezes or rales. Good inspiratory effort. ?Abdomen: Soft nontender nondistended with normal bowel sounds x4 quadrants. ?Musculoskeletal: No lower extremity edema.  ?Skin: Left lower extremity with staples from recent vascular surgery.  Right above-the-knee amputation. ?Psychiatry: Mood is appropriate for condition and setting ? ? ?Data Reviewed: ?CBC: ?Recent Labs  ?Lab 02/15/22 ?1610 02/16/22 ?0102 02/17/22 ?9604 02/17/22 ?5409 02/18/22 ?8119  ?WBC 12.3* 17.9* 14.8* 15.2* 11.9*  ?NEUTROABS  --   --   --  11.0*  --   ?HGB 7.7* 10.2* 9.8* 9.0* 9.5*  ?HCT 24.6* 31.5* 30.3* 28.6* 30.6*  ?MCV 82.8 83.6 84.9 84.9 86.0  ?PLT 393 344 453* 485* 460*  ? ?Basic Metabolic Panel: ?Recent Labs  ?Lab 02/12/22 ?0123 02/15/22 ?1478 02/17/22 ?2956 02/17/22 ?2130 02/18/22 ?8657  ?NA 134* 129* 133* 131* 137  ?K 5.5* 4.4 4.2 4.0 3.8  ?CL 96* 91* 94* 95* 98  ?CO2 23 22 24 23 26   ?GLUCOSE 132* 148* 134* 137* 197*  ?BUN 59* 69* 51* 51* 28*  ?CREATININE 7.87* 9.53* 7.97* 7.90* 5.31*  ?CALCIUM 9.6 9.8 10.4* 10.1 9.9  ?MG  --   --   --   --  2.2  ?  PHOS  --  6.4*  --  4.7*  --   ? ?GFR: ?Estimated Creatinine Clearance: 8.3 mL/min (A) (by C-G formula based on SCr of 5.31 mg/dL (H)). ?Liver Function Tests: ?Recent Labs  ?Lab 02/15/22 ?4171 02/17/22 ?0813  ?ALBUMIN 2.3* 2.4*  ? ?No results for input(s): LIPASE, AMYLASE in the last 168 hours. ?No results for input(s): AMMONIA in the last 168 hours. ?Coagulation Profile: ?No results for input(s): INR, PROTIME in the last 168 hours. ?Cardiac Enzymes: ?No results for input(s): CKTOTAL, CKMB, CKMBINDEX, TROPONINI in the last 168 hours. ?BNP (last 3 results) ?No results for input(s): PROBNP in the last 8760 hours. ?HbA1C: ?No results for input(s): HGBA1C in the last 72 hours. ?CBG: ?Recent Labs  ?Lab 02/17/22 ?1152 02/17/22 ?1558 02/17/22 ?2100 02/18/22 ?2787 02/18/22 ?1126  ?GLUCAP 131* 154* 172* 140* 243*  ? ?Lipid Profile: ?No results for  input(s): CHOL, HDL, LDLCALC, TRIG, CHOLHDL, LDLDIRECT in the last 72 hours. ?Thyroid Function Tests: ?No results for input(s): TSH, T4TOTAL, FREET4, T3FREE, THYROIDAB in the last 72 hours. ?Anemia Panel: ?

## 2022-02-18 NOTE — Progress Notes (Signed)
[facility] ([facility]) Hospital Liaison Note  Notified by TOC manager of patient/family request for [facility] palliative services at SNF after discharge.   [facility] hospital liaison will follow patient for discharge disposition.   Please call with any hospice or outpatient palliative care related questions.   Thank you for the opportunity to participate in this patient's care.   Shanita Wicker, LCSW [facility] Hospital Liaison [phone]  

## 2022-02-18 NOTE — Consult Note (Signed)
?Consultation Note ?Date: 02/18/2022  ? ?Patient Name: Tammy Moses  ?DOB: [birth year]  MRN: [id number]  Age / Sex: 56 y.o., female  ?PCP: [name], FNP ?Referring Physician: Kayleen Memos, DO ? ?Reason for Consultation: Establishing goals of care ? ?HPI/Patient Profile: 56 y.o. female  with past medical history of  hypertension, diabetes mellitus, end-stage renal disease on hemodialysis, blindness, PAD, and status post above-knee amputation 2 months ago admitted on 02/04/2022 with severe hypertension (systolic 329) during her preop work-up.  Patient had been scheduled for debridement of mild dehiscence of right open amputation stump.  Initially admitted to ICU on Cleviprex infusion.  Quickly transitioned back to oral agents and transferred to floor.  On 4/20 patient underwent left femoropopliteal bypass.  Patient with fluctuating mental status throughout admission.  PMT consulted to discuss goals of care. ? ?Clinical Assessment and Goals of Care: ?I have reviewed medical records including EPIC notes, labs and imaging, assessed the patient and then met with patient to discuss diagnosis prognosis, GOC, EOL wishes, disposition and options. ? ?I introduced Palliative Medicine as specialized medical care for people living with serious illness. It focuses on providing relief from the symptoms and stress of a serious illness. The goal is to improve quality of life for both the patient and the family. ? ?Patient found to be alert and oriented and able to discuss her medical care appropriately.  She is mostly focused on her rehab stay postdischarge. ? ?She tells me prior to admission she was living with her mother and was in a wheelchair.  She tells me she had an aide in the home to assist with daily cares.  She tells me she has been tolerating dialysis well unable to complete full sessions. ? ? We discussed patient's current illness and what it means in the larger  context of patient's on-going co-morbidities.  Natural disease trajectory and expectations at EOL were discussed. ? ?I attempted to elicit values and goals of care important to the patient.   ? ?The difference between aggressive medical intervention and comfort care was considered in light of the patient's goals of care.  ? ?Advance directives, concepts specific to code status, artificial feeding and hydration, and rehospitalization were considered and discussed. ? ? ?I completed a MOST form today. The patient and family outlined their wishes for the following treatment decisions: ? ?Cardiopulmonary Resuscitation: Do Not Attempt Resuscitation (DNR/No CPR)  ?Medical Interventions: Limited Additional Interventions: Use medical treatment, IV fluids and cardiac monitoring as indicated, DO NOT USE intubation or mechanical ventilation. May consider use of less invasive airway support such as BiPAP or CPAP. Also provide comfort measures. Transfer to the hospital if indicated. Avoid intensive care.   ?Antibiotics: Antibiotics if indicated  ?IV Fluids: IV fluids if indicated  ?Feeding Tube: Feeding tube for a defined trial period  ? ?We talked about CODE STATUS at length and multiple different ways.  [name] consistently confirmed she would not want a resuscitation attempt and would want to be allowed a natural death.  She also confirms she would not want to go to the ICU for mechanical ventilation. ? ?Discussed with patient the importance of continued conversation with family and the medical providers regarding overall plan of care and treatment options, ensuring decisions are within the context of the patient?s values and GOCs.   ? ?Palliative Care services outpatient were explained and offered.  Daughter agrees to outpatient palliative. ? ?Tammy Moses shares if she were unable to make decisions for herself she  would want her niece Tammy Moses to make decisions for her.  We discussed that since her mother is technically her next  of kin we should complete HCPOA documentation to secure her nieces status as HCPOA.  [name] agrees to this.  She tells me her mother is unable to serve as HCPOA. ? ?Questions and concerns were addressed. The family was encouraged to call with questions or concerns. ? ?Later in the day called patient's niece Tammy Moses.  We reviewed the above conversation.  Niece confirms that patient's status wishes are consistent with previously stated wishes.  Specifically she tells me patient has previously stated she would not want resuscitation attempts.  We reviewed the completed MOST form and niece agrees.  Niece further shares she is hopeful for facility placement however if placement in a facility that they deem acceptable cannot be obtained they will take patient home with home health. ? ?Primary Decision Maker ?PATIENT ? Patient to name niece Tammy Moses as [place] ? ?SUMMARY OF RECOMMENDATIONS   ?- code status change to DNR per discussion with patient and niece ?- MOST completed as above ?- outpatient palliative referral - given to liaison ? ?Code Status/Advance Care Planning: ?DNR ? ?Discharge Planning: [place] for rehab with Palliative care service follow-up  ? ?  ? ?Primary Diagnoses: ?Present on Admission: ? Hypertension ? ESRD (end stage renal disease) ([place]) -  Dialyzes at [facility].  On Monday Wednesday Friday. ? Peripheral arterial disease ([place]) ? Anemia in chronic kidney disease (CKD) ? ? ?I have reviewed the medical record, interviewed the patient and family, and examined the patient. The following aspects are pertinent. ? ?Past Medical History:  ?Diagnosis Date  ? Anemia   ? Anxiety   ? Blind right eye   ? Chronic kidney disease, stage V ([place])   ? Dialysis M-W-F  ? Confusion   ? at times  ? Constipation, chronic   ? Dental caries   ? Depression   ? Diabetes mellitus   ? Diabetic neuropathy ([place])   ? Diabetic retinopathy   ? GERD (gastroesophageal reflux disease)   ? Glaucoma   ? H. pylori  infection   ? Hepatitis C carrier ([place])   ? High risk sexual behavior   ? Hyperlipidemia   ? Hypertension   ? Hypoglycemia 07/12/2017  ? Insomnia   ? Microalbuminuria   ? Nonspecific elevation of levels of transaminase or lactic acid dehydrogenase (LDH)   ? Tobacco dependence   ? Vitamin D deficiency   ? ?Social History  ? ?Socioeconomic History  ? Marital status: Divorced  ?  Spouse name: Not on file  ? Number of children: 3  ? Years of education: 81  ? Highest education level: Not on file  ?Occupational History  ?  Employer: DISABLED  ?  Comment: Disabled  ?Tobacco Use  ? Smoking status: Former  ?  Packs/day: 0.25  ?  Types: Cigarettes  ?  Quit date: 10/2021  ?  Years since quitting: 0.3  ?  Passive exposure: Never  ? Smokeless tobacco: Never  ? Tobacco comments:  ?  trying to quit  ?Vaping Use  ? Vaping Use: Never used  ?Substance and Sexual Activity  ? Alcohol use: Not Currently  ?  Comment: 2-3 beers weekly  ? Drug use: Not Currently  ?  Types: Marijuana, Cocaine  ?  Comment: quit in 2018  ? Sexual activity: Yes  ?  Birth control/protection: Surgical  ?  Comment: Hysterectomy  ?Other Topics  Concern  ? Not on file  ?Social History Narrative  ? Patient lives at home alone.  ? Disabled.  ? Right handed.  ? Education 11 th grade.  ? Caffeine three cups of coffee daily.  ? No psychiatrist  ? ?Social Determinants of Health  ? ?Financial Resource Strain: Not on file  ?Food Insecurity: Not on file  ?Transportation Needs: Not on file  ?Physical Activity: Not on file  ?Stress: Not on file  ?Social Connections: Not on file  ? ?Family History  ?Problem Relation Age of Onset  ? High blood pressure Mother   ? Diabetes Mother   ? Thyroid disease Mother   ? Diabetes Father   ? High blood pressure Father   ? Cerebral palsy Daughter   ? Other Son   ?     still born  ? ?Scheduled Meds: ? sodium chloride   Intravenous Once  ? amLODipine  10 mg Oral Daily  ? aspirin EC  81 mg Oral Daily  ? atorvastatin  80 mg Oral Daily  ? atropine   1 drop Both Eyes BID  ? Chlorhexidine Gluconate Cloth  6 each Topical Q0600  ? clopidogrel  75 mg Oral Q0600  ? darbepoetin (ARANESP) injection - DIALYSIS  60 mcg Intravenous Q Mon-HD  ? dorzolamide-timolol  1 dr

## 2022-02-18 NOTE — Plan of Care (Signed)
?  Problem: Education: ?Goal: Understanding of CV disease, CV risk reduction, and recovery process will improve ?Outcome: Progressing ?Goal: Individualized Educational Video(s) ?Outcome: Progressing ?  ?Problem: Activity: ?Goal: Ability to return to baseline activity level will improve ?Outcome: Progressing ?  ?Problem: Cardiovascular: ?Goal: Ability to achieve and maintain adequate cardiovascular perfusion will improve ?Outcome: Progressing ?Goal: Vascular access site(s) Level 0-1 will be maintained ?Outcome: Progressing ?  ?Problem: Health Behavior/Discharge Planning: ?Goal: Ability to safely manage health-related needs after discharge will improve ?Outcome: Progressing ?  ?Problem: Nutrition: ?Goal: Adequate nutrition will be maintained ?Outcome: Progressing ?  ?Problem: Activity: ?Goal: Risk for activity intolerance will decrease ?Outcome: Progressing ?  ?Problem: Fluid Volume: ?Goal: Compliance with measures to maintain balanced fluid volume will improve ?Outcome: Progressing ?  ?Problem: Skin Integrity: ?Goal: Risk for impaired skin integrity will decrease ?Outcome: Progressing ?  ?

## 2022-02-18 NOTE — Progress Notes (Signed)
Physical Therapy Treatment ?Patient Details ?Name: Tammy Moses ?MRN: [id number] ?DOB: [birth year] ?Today's Date: 02/18/2022 ? ? ?History of Present Illness 56 yo female presenting on 02/04/2022 to [facility] outpatient surgery for elective wound debridement and angiogram, preop found to have severe hypertension. Admitted to ICU. S/p Ultrasound-guided micropuncture access of the right common femoral artery and Second-order cannulation, left lower extremity angiogram on 4/19. S/p Left iliofemoral endarterectomy and Left femoral to below-knee popliteal artery bypass on 4/20. 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 ?  ?PT Comments  ? ? Pt received in supine, agreeable to therapy session with goal of transfer training and wheelchair mobility for BUE strengthening. Pt able to perform lateral squat/scoot transfer from bed<>wheelchair with +2  modA and needs up to modA +1 with multimodal cues to propel wheelchair in hallway ~42ft. 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 Pt continues to benefit from PT services to progress toward functional mobility goals.   ?Recommendations for follow up therapy are one component of a multi-disciplinary discharge planning process, led by the attending physician.  Recommendations may be updated based on patient status, additional functional criteria and insurance authorization. ? ?Follow Up Recommendations ? Skilled nursing-short term rehab (<3 hours/day) ?  ?  ?Assistance Recommended at Discharge Frequent or constant Supervision/Assistance  ?Patient can return home with the following Two people to help with walking and/or transfers;A lot of help with bathing/dressing/bathroom;Assistance with cooking/housework;Direct supervision/assist for medications management;Direct supervision/assist for financial management;Assist for transportation;Help with stairs or ramp for  entrance ?  ?Equipment Recommendations ? Other (comment) (defer to next venue of care)  ?  ?Recommendations for Other Services   ? ? ?  ?Precautions / Restrictions Precautions ?Precautions: Fall ?Precaution Comments: s/p R AKA 11/2021 ?Restrictions ?Other Position/Activity Restrictions: Recent R AKA  ?  ? ?Mobility ? Bed Mobility ?Overal bed mobility: Needs Assistance ?Bed Mobility: Supine to Sit, Sit to Supine ?  ?  ?Supine to sit: Mod assist ?Sit to supine: Min assist ?  ?General bed mobility comments: Limited due to blindness and mild confusion, mod cues and increased time to raise trunk; only needing minA for sit>sidelying transition propping down onto elbow but pt does need tactile/verbal cues for proper direction to lay toward [facility] due to blindness. ?  ? ?Transfers ?Overall transfer level: Needs assistance ?Equipment used:  (2 person assist with HHA vs transfer pad) ?Transfers: Bed to chair/wheelchair/BSC ?  ?   ? Lateral/Scoot Transfers: Mod assist, From elevated surface, +2 physical assistance ?General transfer comment: LLE blocked and multimodal cues for pt BUE placement and body mechanics; heavyA with transfer pad ?  ? ?  ? ? ?Wheelchair Mobility ?Wheelchair Mobility ?Wheelchair mobility: Yes ?Wheelchair propulsion: Both upper extremities ?Wheelchair parts: Needs assistance ?Distance: 80 ?Wheelchair Assistance Details (indicate cue type and reason): Pt needing instruction on locking wheelchair, proper hand placement on wheel rims and technique to propel and turn chair. Pt with fair carryover of instruction within session for propelling chair but needs frequent cues for navigation due to poor vision and for safe hand placement on rims not on wheel itself. Hand over hand assist needed with turning chair. ? ? ?  ?Balance Overall balance assessment: Needs assistance ?Sitting-balance support: Feet supported, Single extremity supported, Bilateral upper extremity supported (intermittent increased support from  therapist) ?Sitting balance-Leahy Scale: Poor ?Sitting balance - Comments: Reliant on UE support and variable min  guard to minA for sitting balance ?Postural control: Posterior lean ?  ?  ?  ?  ?  ?  ?  ?  ?  ?  ?  ?  ?  ?  ?  ? ?  ?Cognition Arousal/Alertness: Awake/alert ?Behavior During Therapy: Flat affect ?Overall Cognitive Status: History of cognitive impairments - at baseline ?  ?  ?  ?  ?  ?  ?  ?  ?  ?  ?  ?  ?  ?  ?  ?  ?General Comments: Pt following simple 1-step commands with increased time and cues, pt appearing more fatigued after return to supine, on one or two occasions making non-sensical statements but mostly responding appropriately. ?  ?  ? ?  ?   ?General Comments General comments (skin integrity, edema, etc.): DBP and MAP soft after return to supine; BP 109/37 (57) supine post-exertion, RN notified. SpO2/HR WFL on RA ?  ?  ? ?Pertinent Vitals/Pain Pain Assessment ?Pain Assessment: Faces ?Faces Pain Scale: Hurts little more ?Pain Location: groin ?Pain Descriptors / Indicators: Discomfort, Guarding ?Pain Intervention(s): Limited activity within patient's tolerance, Monitored during session, Repositioned  ? ? ? ?PT Goals (current goals can now be found in the care plan section) Acute Rehab PT Goals ?Patient Stated Goal: to get OOB and move around ?PT Goal Formulation: With patient ?Time For Goal Achievement: 03/13/2022 ?Progress towards PT goals: Progressing toward goals ? ?  ?Frequency ? ? ? Min 2X/week ? ? ? ?  ?PT Plan Current plan remains appropriate  ? ? ?   ?AM-PAC PT "6 Clicks" Mobility   ?Outcome Measure ? Help needed turning from your back to your side while in a flat bed without using bedrails?: A Little ?Help needed moving from lying on your back to sitting on the side of a flat bed without using bedrails?: A Lot ?Help needed moving to and from a bed to a chair (including a wheelchair)?: A Lot ?Help needed standing up from a chair using your arms (e.g., wheelchair or bedside chair)?:  Total ?Help needed to walk in hospital room?: Total ?Help needed climbing 3-5 steps with a railing? : Total ?6 Click Score: 10 ? ?  ?End of Session   ?Activity Tolerance: Patient tolerated treatment well ?Patient left: in bed;with call bell/phone within reach;with bed alarm set ?Nurse Communication: Mobility status;Other (comment) (soft BP (DBP/MAP low)) ?PT Visit Diagnosis: Unsteadiness on feet (R26.81);Muscle weakness (generalized) (M62.81) ?  ? ? ?Time: 8921-1941 ?PT Time Calculation (min) (ACUTE ONLY): 25 min ? ?Charges:  $Therapeutic Exercise: 8-22 mins ?$Therapeutic Activity: 8-22 mins          ?          ? ?Trace Cederberg P., PTA ?Acute Rehabilitation Services ?Secure Chat Preferred 9a-5:30pm ?Office: [phone]  ? ? ?Kara Pacer Kirby Argueta ?02/18/2022, 2:19 PM ? ?

## 2022-02-18 NOTE — Progress Notes (Addendum)
Pt has been accepted at [facility] on TTS schedule. Pt can start on Saturday. Pt will need to arrive to first appt at 11:15 to complete paperwork prior to 11:45 chair time. After first appt, pt will need to arrive at 11:30. Update provided to CSW who is going to confirm that snf agreeable to this schedule. Will assist as needed.  ? ?Melven Sartorius ?Renal Navigator ?[phone] ? ?Addendum at 2:00 pm: ?Per CSW, snf is unable to do a TTS schedule. SNF willing to transport pt to any [facility] clinic that has a MWF available. Awaiting response from [facility] admissions regarding possible options.  ?

## 2022-02-18 NOTE — Progress Notes (Signed)
CM spoke with pt at bedside to discuss prior [facility] at home. Per pt she had [place] services at home- but can not remember name. Per review in patient PING- pt was active with [facility]- call made to [facility] to confirm- per Ramond Marrow pt had HHRN/PT/OT services- and if needed [facility] can resume. Pt was also followed by [facility] community network.  ?Pt has home wheelchair at bedside.  ?TOC to continue to follow for transition plan SNF vs Home w/ HH ?

## 2022-02-19 LAB — CBC
HCT: 29.8 % — ABNORMAL LOW (ref 36.0–46.0)
Hemoglobin: 9.1 g/dL — ABNORMAL LOW (ref 12.0–15.0)
MCH: 26.8 pg (ref 26.0–34.0)
MCHC: 30.5 g/dL (ref 30.0–36.0)
MCV: 87.9 fL (ref 80.0–100.0)
Platelets: 515 10*3/uL — ABNORMAL HIGH (ref 150–400)
RBC: 3.39 MIL/uL — ABNORMAL LOW (ref 3.87–5.11)
RDW: 19.6 % — ABNORMAL HIGH (ref 11.5–15.5)
WBC: 15.5 10*3/uL — ABNORMAL HIGH (ref 4.0–10.5)
nRBC: 0 % (ref 0.0–0.2)

## 2022-02-19 LAB — RENAL FUNCTION PANEL
Albumin: 2.6 g/dL — ABNORMAL LOW (ref 3.5–5.0)
Anion gap: 16 — ABNORMAL HIGH (ref 5–15)
BUN: 44 mg/dL — ABNORMAL HIGH (ref 6–20)
CO2: 21 mmol/L — ABNORMAL LOW (ref 22–32)
Calcium: 10.8 mg/dL — ABNORMAL HIGH (ref 8.9–10.3)
Chloride: 100 mmol/L (ref 98–111)
Creatinine, Ser: 7.6 mg/dL — ABNORMAL HIGH (ref 0.44–1.00)
GFR, Estimated: 6 mL/min — ABNORMAL LOW (ref >60)
Glucose, Bld: 127 mg/dL — ABNORMAL HIGH (ref 70–99)
Phosphorus: 3.7 mg/dL (ref 2.5–4.6)
Potassium: 4 mmol/L (ref 3.5–5.1)
Sodium: 137 mmol/L (ref 135–145)

## 2022-02-19 LAB — GLUCOSE, CAPILLARY
Glucose-Capillary: 158 mg/dL — ABNORMAL HIGH (ref 70–99)
Glucose-Capillary: 123 mg/dL — ABNORMAL HIGH (ref 70–99)
Glucose-Capillary: 129 mg/dL — ABNORMAL HIGH (ref 70–99)

## 2022-02-19 MED ORDER — PANTOPRAZOLE SODIUM 40 MG PO TBEC
40.0000 mg | DELAYED_RELEASE_TABLET | Freq: Every day | ORAL | 0 refills | Status: AC
Start: 2022-02-20 — End: 2022-05-21

## 2022-02-19 MED ORDER — CYCLOBENZAPRINE HCL 10 MG PO TABS
10.0000 mg | ORAL_TABLET | Freq: Once | ORAL | Status: AC
  Administered 2022-02-19: 10 mg via ORAL
  Filled 2022-02-19: qty 1

## 2022-02-19 MED ORDER — CLOPIDOGREL BISULFATE 75 MG PO TABS: 75.0000 mg | ORAL_TABLET | Freq: Every day | ORAL | 0 refills | Status: DC

## 2022-02-19 MED ORDER — CEPHALEXIN 250 MG PO CAPS: 250.0000 mg | ORAL_CAPSULE | Freq: Two times a day (BID) | ORAL | 0 refills | Status: DC

## 2022-02-19 MED ORDER — DOXYCYCLINE HYCLATE 50 MG PO CAPS: 50.0000 mg | ORAL_CAPSULE | Freq: Two times a day (BID) | ORAL | 0 refills | Status: DC

## 2022-02-19 MED ORDER — ASPIRIN 81 MG PO TBEC: 81.0000 mg | DELAYED_RELEASE_TABLET | Freq: Every day | ORAL | 0 refills | Status: DC

## 2022-02-19 MED ORDER — METOPROLOL TARTRATE 50 MG PO TABS: 50.0000 mg | ORAL_TABLET | Freq: Two times a day (BID) | ORAL | 0 refills | Status: DC

## 2022-02-19 NOTE — TOC Transition Note (Signed)
Transition of Care (TOC) - CM/SW Discharge Note ?Marvetta Gibbons Therapist, sports, BSN ?Transitions of Care ?Unit 4E- RN Case Manager ?See Treatment Team for direct phone #  ? ? ?Patient Details  ?Name: Tammy Moses ?MRN: [id number] ?Date of Birth: 12/13/1965 ? ?Transition of Care (TOC) CM/SW Contact:  ?Dahlia Client, Romeo Rabon, RN ?Phone Number: ?02/19/2022, 2:03 PM ? ? ?Clinical Narrative:    ?Pt stable for transition home, per CSW pt's insurance will not cover STSNF for rehab, and family has been updated that pt will need to return home w/ HH.  ?Pt has needed DME at home- no new DME needs noted. Wheelchair in the room. Per CSW - niece to come around 230pm to transport pt home.  ?Pt has refused HD today after several offers today- plan will be for pt to go her outpt HD clinic on Monday for her normal scheduled time.  ? ?[place] orders have been placed- RN/PT/OT/aide/CSW- call made to [facility] with [facility] for resumption of [facility] services- they will contact pt to schedule.  ? ? ?Final next level of care: [place] ?Barriers to Discharge: Barriers Resolved ? ? ?Patient Goals and CMS Choice ?Patient states their goals for this hospitalization and ongoing recovery are:: want rehab ?CMS Medicare.gov Compare Post Acute Care list provided to:: Patient ?Choice offered to / list presented to : Patient, [facility] POA / Guardian ? ?Discharge Placement ?  ?           ? Home w/ HH ?  ?  ?  ? ?Discharge Plan and Services ?In-house Referral: Clinical Social Work ?Discharge Planning Services: CM Consult ?Post Acute Care Choice: Home Health, Resumption of Svcs/PTA Provider          ?DME Arranged: N/A ?DME Agency: NA ?  ?  ?  ?HH Arranged: RN, PT, OT, Nurse's Aide, Social Work ?[place] Agency: [facility] ?Date HH Agency Contacted: 02/19/22 ?Time [place]: 0223 ?Representative spoke with at [place]: Ramond Marrow ? ?Social Determinants of Health (SDOH) Interventions ?  ? ? ?Readmission Risk Interventions ? ?  02/19/2022  ?   2:03 PM 10/09/2021  ?  4:18 PM  ?Readmission Risk Prevention Plan  ?Transportation Screening Complete Complete  ?Medication Review Press photographer) Complete   ?PCP or Specialist appointment within 3-5 days of discharge Complete   ?[place] or Home Care Consult Complete Complete  ?SW Recovery Care/Counseling Consult Complete Complete  ?Palliative Care Screening Not Applicable Not Applicable  ?Skilled Nursing Facility Not Applicable Complete  ? ? ? ? ? ?

## 2022-02-19 NOTE — Progress Notes (Addendum)
Pt has been accepted at [facility] on MWF. For first appt, pt will need to arrive at 10:30 to complete paperwork prior to 11:25 chair time. After first appt, pt will need to arrive at 11:00 for 11:25 chair time. This information provided to CSW earlier today. CSW states pt's family considering d/c options and to return call to [place] with determination. Will await update from CSW regarding d/c plan. If pt does not admit to snf in [place], will need to cancel referral to [facility] and make sure [facility] aware pt will resume at d/c. Will assist as needed.  ? ?Melven Sartorius ?Renal Navigator ?[phone] ? ?Addendum at 3:06 pm: ?Advised by CSW that pt will return home with family at d/c. Pt will not require clinic placement in [place]. Contacted [facility] and spoke to [place], clinic SW. Chi advised that pt will d/c today and will resume care on Monday. Chi confirms that pt will resume  regular schedule as prior to admission (MWF 11:10 chair time). Clinic has access to [facility] Epic to obtain d/c summary for continuation of care. Clinic also advised pt declined HD today. Nephrologist advised of the above info via secure chat with treatment team. Contacted [facility] admissions to cancel referral to [place].  ? ?

## 2022-02-19 NOTE — TOC Progression Note (Signed)
Transition of Care (TOC) - Progression Note  ? ? ?Patient Details  ?Name: Tammy Moses ?MRN: [id number] ?Date of Birth: Feb 24, 1966 ? ?Transition of Care (TOC) CM/SW Contact  ?Vinie Sill, LCSW ?Phone Number: ?02/19/2022, 1:38 PM ? ?Clinical Narrative:    ? ?Spoke with patient's niece- she confirmed, patient will discharge home.  ? ?MD,RN, RNCM- updated ? ?Thurmond Butts, MSW, LCSW ?Clinical Social Worker ? ? ? ?Expected Discharge Plan: [place] ?Barriers to Discharge: SNF Pending bed offer, Insurance Authorization ? ?Expected Discharge Plan and Services ?Expected Discharge Plan: [place] ?In-house Referral: Clinical Social Work ?  ?  ?  ?                ?  ?  ?  ?  ?  ?  ?  ?  ?  ?  ? ? ?Social Determinants of Health (SDOH) Interventions ?  ? ?Readmission Risk Interventions ? ?  10/09/2021  ?  4:18 PM  ?Readmission Risk Prevention Plan  ?Transportation Screening Complete  ?[place] or Home Care Consult Complete  ?SW Recovery Care/Counseling Consult Complete  ?Palliative Care Screening Not Applicable  ?Skilled Nursing Facility Complete  ? ? ?

## 2022-02-19 NOTE — Plan of Care (Signed)
?  Problem: Education: ?Goal: Understanding of CV disease, CV risk reduction, and recovery process will improve ?Outcome: Adequate for Discharge ?Goal: Individualized Educational Video(s) ?Outcome: Adequate for Discharge ?  ?Problem: Activity: ?Goal: Ability to return to baseline activity level will improve ?Outcome: Adequate for Discharge ?  ?Problem: Cardiovascular: ?Goal: Ability to achieve and maintain adequate cardiovascular perfusion will improve ?Outcome: Adequate for Discharge ?Goal: Vascular access site(s) Level 0-1 will be maintained ?Outcome: Adequate for Discharge ?  ?Problem: Health Behavior/Discharge Planning: ?Goal: Ability to manage health-related needs will improve ?Outcome: Adequate for Discharge ?  ?Problem: Clinical Measurements: ?Goal: Ability to maintain clinical measurements within normal limits will improve ?Outcome: Adequate for Discharge ?Goal: Will remain free from infection ?Outcome: Adequate for Discharge ?Goal: Diagnostic test results will improve ?Outcome: Adequate for Discharge ?Goal: Respiratory complications will improve ?Outcome: Adequate for Discharge ?Goal: Cardiovascular complication will be avoided ?Outcome: Adequate for Discharge ?  ?Problem: Elimination: ?Goal: Will not experience complications related to bowel motility ?Outcome: Adequate for Discharge ?Goal: Will not experience complications related to urinary retention ?Outcome: Adequate for Discharge ?  ?Problem: Fluid Volume: ?Goal: Compliance with measures to maintain balanced fluid volume will improve ?Outcome: Adequate for Discharge ?  ?Problem: Clinical Measurements: ?Goal: Complications related to the disease process, condition or treatment will be avoided or minimized ?Outcome: Adequate for Discharge ?  ?Problem: Safety: ?Goal: Ability to remain free from injury will improve ?Outcome: Adequate for Discharge ?  ?

## 2022-02-19 NOTE — Consult Note (Signed)
? ?  [facility] CM Inpatient Consult ? ? ?02/19/2022 ? ?Melvia Heaps ?Apr 28, 1966 ?[id number] ? ?[place] Organization [ACO] Patient: [employer] ? ?Primary Care Provider: [name], [place] ? ? ?Follow up: TOC barriers,  ? ?Spoke with inpatient [facility] RNCM and there were concerns for insurance coverage for her pursuit for short term rehab needs. ? ?Plan:  Will reassign patient for post hospital TOC and care management follow up. ? ?For questions, ? ?Natividad Brood, RN BSN CCM ?[facility] Liaison ? [phone] business mobile phone ?Toll free office [phone]  ?Fax number: [phone] ?[email or web address] ? ? ?

## 2022-02-19 NOTE — Progress Notes (Signed)
Nephrology Follow-Up Consult note ? ? ?Assessment/Recommendations: Tammy Moses is a/an 56 y.o. female with a past medical history significant for ESRD on HD, PAD, DM2, HTN initially presented for outpatient elective wound debridement and angio however was found to have severe uncontrolled HTN.  ?  ?# ESRD:  ?-outpatient orders: [facility].  MWF.  [id number].  3.5 hours.  350/600.  2K, 2.5 Cal.  LUE AVF.  Heparin 1500 units bolus, 500 units maintenance.  Venofer 50 mg q. treatment, EPO 11,800 units, Sensipar 30 q. Mondays.  ?Has transitioned to [facility] while in SNF in [place] >> revised, now will be MWF on last report ?Therefore, will plan for Next HD on 4/28 - if refuses today try tomorrow; hopefully pain control can be adequate.  ?New EDW probably around 45kg ?  ?# Volume/ severe hypertension: EDW ~45kg. she is no longer tolerating much UF ?-Continue labetalol and hydral, losartan, Lasix.   ?  ?# PAD s/p right AKA w/ nonhealing wound/gangrene, leukocytosis ?-recent rt AKA after unsuccessful revascularization  ?- s/p left fem below knee pop bypass with ipsilateral non reversed GSV + left iliofemoral endarterectomy wit hVVS ?-abx per primary service if indicated ?  ?# Anemia of Chronic Kidney Disease: Hemoglobin 9-10 on ESA;  Tranfsued 4/24 ?  ?# Secondary Hyperparathyroidism/Hyperphosphatemia: resumed home binders  sevelamer 3 tabs TIDM; P Ok ?  ?# Vascular access: LUE BCF ? ? ?# Additional recommendations: ?- Dose all meds for creatinine clearance < 10 ml/min  ?- Unless absolutely necessary, no MRIs with gadolinium.  ?- Implement save arm precautions.  Prefer needle sticks in the dorsum of the hands or wrists.  No blood pressure measurements in left arm. ?- If blood transfusion is requested during hemodialysis sessions, please alert [place] prior to the session.  ? ? ?Justin Mend ?[facility] ?02/19/2022 ?10:25 AM ? ?___________________________________________________________ ? ?CC:  ESRD ? ?Interval History/Subjective:  ?Seen in room - c/o pain in leg and says if not under control will not come to HD today ?For SNF at DC and will req transfer to a HD unit in [place] ? ? ?Medications:  ?Current Facility-Administered Medications  ?Medication Dose Route Frequency Provider Last Rate Last Admin  ? 0.9 %  sodium chloride infusion (Manually program via Guardrails IV Fluids)   Intravenous Once Danford, Suann Larry, MD      ? acetaminophen (TYLENOL) tablet 325-650 mg  325-650 mg Oral Q4H PRN Dagoberto Ligas, PA-C   650 mg at 02/16/22 8413  ? Or  ? acetaminophen (TYLENOL) suppository 325-650 mg  325-650 mg Rectal Q4H PRN Dagoberto Ligas, PA-C      ? albuterol (PROVENTIL) (2.5 MG/3ML) 0.083% nebulizer solution 3 mL  3 mL Inhalation Q4H PRN Dagoberto Ligas, PA-C      ? amLODipine (NORVASC) tablet 10 mg  10 mg Oral Daily Dagoberto Ligas, PA-C   10 mg at 02/19/22 0900  ? aspirin EC tablet 81 mg  81 mg Oral Daily Dagoberto Ligas, PA-C   81 mg at 02/19/22 0900  ? atorvastatin (LIPITOR) tablet 80 mg  80 mg Oral Daily Dagoberto Ligas, PA-C   80 mg at 02/19/22 0900  ? atropine 1 % ophthalmic solution 1 drop  1 drop Both Eyes BID Dagoberto Ligas, PA-C   1 drop at 02/19/22 0901  ? Chlorhexidine Gluconate Cloth 2 % PADS 6 each  6 each Topical Q0600 Dagoberto Ligas, PA-C   6 each at 02/18/22 2303  ? clopidogrel (PLAVIX) tablet 75 mg  75 mg Oral Q0600 Dagoberto Ligas, PA-C   75 mg at 02/19/22 2094  ? Darbepoetin Alfa (ARANESP) injection 60 mcg  60 mcg Intravenous Q Mon-HD Rexene Agent, MD   60 mcg at 02/15/22 0946  ? dorzolamide-timolol (COSOPT) 22.3-6.8 MG/ML ophthalmic solution 1 drop  1 drop Both Eyes BID Dagoberto Ligas, PA-C   1 drop at 02/19/22 0901  ? feeding supplement (BOOST / RESOURCE BREEZE) liquid 1 Container  1 Container Oral TID BM Pokhrel, Laxman, MD   1 Container at 02/19/22 0901  ? furosemide (LASIX) tablet 80 mg  80 mg Oral 2 times per day on Sun Tue Thu Sat Dagoberto Ligas, PA-C   80 mg  at 02/18/22 1938  ? guaiFENesin-dextromethorphan (ROBITUSSIN DM) 100-10 MG/5ML syrup 15 mL  15 mL Oral Q4H PRN Dagoberto Ligas, PA-C      ? heparin injection 5,000 Units  5,000 Units Subcutaneous Q8H Dagoberto Ligas, PA-C   5,000 Units at 02/19/22 7096  ? hydrALAZINE (APRESOLINE) injection 10 mg  10 mg Intravenous Q8H PRN Dagoberto Ligas, PA-C   10 mg at 02/19/22 0211  ? hydrALAZINE (APRESOLINE) tablet 100 mg  100 mg Oral Q8H Danford, Suann Larry, MD   100 mg at 02/19/22 2836  ? insulin aspart (novoLOG) injection 0-6 Units  0-6 Units Subcutaneous TID WC Dagoberto Ligas, PA-C   1 Units at 02/19/22 6294  ? labetalol (NORMODYNE) injection 10 mg  10 mg Intravenous Q2H PRN Dagoberto Ligas, PA-C   10 mg at 02/19/22 7654  ? latanoprost (XALATAN) 0.005 % ophthalmic solution 1 drop  1 drop Both Eyes QHS Dagoberto Ligas, PA-C   1 drop at 02/18/22 2117  ? losartan (COZAAR) tablet 100 mg  100 mg Oral Daily Dagoberto Ligas, PA-C   100 mg at 02/19/22 0900  ? metoCLOPramide (REGLAN) tablet 10 mg  10 mg Oral TID AC Dagoberto Ligas, PA-C   10 mg at 02/19/22 0900  ? metoprolol tartrate (LOPRESSOR) injection 2-5 mg  2-5 mg Intravenous Q2H PRN Dagoberto Ligas, PA-C      ? metoprolol tartrate (LOPRESSOR) tablet 50 mg  50 mg Oral BID Dagoberto Ligas, PA-C   50 mg at 02/19/22 0900  ? multivitamin (RENA-VIT) tablet 1 tablet  1 tablet Oral QHS Dagoberto Ligas, PA-C   1 tablet at 02/18/22 2112  ? ondansetron (ZOFRAN) injection 4 mg  4 mg Intravenous Q6H PRN Dagoberto Ligas, PA-C      ? oxyCODONE-acetaminophen (PERCOCET/ROXICET) 5-325 MG per tablet 1-2 tablet  1-2 tablet Oral Q4H PRN Dagoberto Ligas, PA-C   2 tablet at 02/19/22 0016  ? pantoprazole (PROTONIX) EC tablet 40 mg  40 mg Oral Daily Dagoberto Ligas, PA-C   40 mg at 02/19/22 0900  ? phenol (CHLORASEPTIC) mouth spray 1 spray  1 spray Mouth/Throat PRN Dagoberto Ligas, PA-C      ? polyethylene glycol (MIRALAX / GLYCOLAX) packet 17 g  17 g Oral Daily Edwin Dada, MD   17 g at 02/19/22 0858  ? senna-docusate (Senokot-S) tablet 1 tablet  1 tablet Oral Daily PRN Dagoberto Ligas, PA-C   1 tablet at 02/13/22 2114  ? senna-docusate (Senokot-S) tablet 2 tablet  2 tablet Oral Daily Edwin Dada, MD   2 tablet at 02/19/22 0900  ? sevelamer carbonate (RENVELA) tablet 2,400 mg  2,400 mg Oral TID WC Dagoberto Ligas, PA-C   2,400 mg at 02/19/22 0900  ? sevelamer carbonate (RENVELA) tablet 800 mg  800 mg Oral PRN Dagoberto Ligas, PA-C  800 mg at 02/05/22 1635  ?  ? ? ?Review of Systems: ?10 systems reviewed and negative except per interval history/subjective ? ?Physical Exam: ?Vitals:  ? 02/19/22 0738 02/19/22 0900  ?BP: (!) 211/60 (!) 158/38  ?Pulse: 80   ?Resp: 16   ?Temp: 98.4 ?F (36.9 ?C)   ?SpO2: 97%   ? ?No intake/output data recorded. ?No intake or output data in the 24 hours ending 02/19/22 1025 ? ? ?Constitutional: Chronically ill-appearing, no distress but c/o pain ?ENMT: MMM ?CV: normal rate, no audible rub ?Respiratory: Bilateral chest rise, normal work of breathing ?Gastrointestinal: soft, nondistended ?Psych: alert, appropriate mood and affect ?Extr: no edema, R AKA, L with multiple incisions c/d/I ?Access: LUA BCF, +t/b ? ? ?Test Results ? ?Lab Results  ?Component Value Date  ? NA 137 02/18/2022  ? K 3.8 02/18/2022  ? CL 98 02/18/2022  ? CO2 26 02/18/2022  ? BUN 28 (H) 02/18/2022  ? CREATININE 5.31 (H) 02/18/2022  ? CALCIUM 9.9 02/18/2022  ? ALBUMIN 2.4 (L) 02/17/2022  ? PHOS 4.7 (H) 02/17/2022  ? ? ?CBC ?Recent Labs  ?Lab 02/17/22 ?0728 02/17/22 ?0813 02/18/22 ?4600  ?WBC 14.8* 15.2* 11.9*  ?NEUTROABS  --  11.0*  --   ?HGB 9.8* 9.0* 9.5*  ?HCT 30.3* 28.6* 30.6*  ?MCV 84.9 84.9 86.0  ?PLT 453* 485* 460*  ? ? ? ? ? ? ?

## 2022-02-19 NOTE — Discharge Summary (Addendum)
?Discharge Summary ? ?CALLE SCHADER IRW:[id number] DOB: [birth year] ? ?PCP: [name], FNP ? ?Admit date: 02/04/2022 ?Discharge date: 02/19/2022 ? ?Time spent: 35 minutes ? ?Recommendations for Outpatient Follow-up:  ?Follow-up with vascular surgery within a week. ?Follow-up with your primary care provider within a week. ?Keep your hemodialysis appointments ? ? ?Discharge Diagnoses:  ?Active Hospital Problems  ? Diagnosis Date Noted  ? 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  Priority: 1.  ? Hypertensive urgency 02/06/2022  ?  Priority: 2.  ? Delirium 02/11/2022  ?  Priority: 3.  ? Hyponatremia 02/15/2022  ? Malnutrition of moderate degree 02/12/2022  ? S/P AKA (above knee amputation) unilateral, right ([place]) 12/22/2021  ? Peripheral arterial disease ([place])   ? Anemia in chronic kidney disease (CKD) 07/12/2017  ? ESRD (end stage renal disease) ([place]) -  Dialyzes at [facility].  On Monday Wednesday Friday. 06/17/2017  ? Controlled type 2 diabetes mellitus with chronic kidney disease on chronic dialysis ([place]) 05/10/2017  ? Hypertension 08/11/2010  ? Blindness 08/11/2010  ?  ?Resolved Hospital Problems  ? Diagnosis Date Noted Date Resolved  ? Protein-calorie malnutrition, severe ([place]) 02/12/2022 02/12/2022  ? ? ?Discharge Condition: Stable ? ?Diet recommendation: Renal hemodialysis, carb modified diet. ? ?Vitals:  ? 02/19/22 0738 02/19/22 0900  ?BP: (!) 211/60 (!) 158/38  ?Pulse: 80   ?Resp: 16   ?Temp: 98.4 ?F (36.9 ?C)   ?SpO2: 97%   ? ? ?History of present illness:  ?Ms Tammy Moses is a 56 y.o. female with past medical history of hypertension, Type II diabetes mellitus, end-stage renal disease on hemodialysis MWF, status post above-knee amputation 2 months ago, who presented to the hospital with complaints of severe hypertension during her preop work-up.  Of note patient had AKA on right in February and was discharged to skilled nursing  facility.  Patient did have a follow-up appointment with vascular surgery in April when she was noted to have new right gangrene of the left great toe.  Patient was then scheduled for debridement of the mild dehiscence of the right open amputation stump as well as left lower extremity angiogram.  On the day of surgery unfortunately blood pressure was 761 systolic so she was brought into the hospital. ?  ?Sequence of events. ? 4/13: Sent from preop for admission due to severe HTN >> admitted to ICU on Cleviprex ?4/14: BP self corrected rapidly, put back on oral agents, transferred to floor ?4/17: Vascular surgery consulted ?4/19: Angiography of left leg completed, bypass offered ?4/20: LEFT fem-pop bypass by Dr. Virl Cagey ?4/21-23: SNF placement sought ?4/24: Transfused 1 unit PRBCs ?4/25: Palliative care consulted in setting of concern for failure to thrive ?4/28: Declines hemodialysis 3 times, stating she does not want to do hemodialysis. ?    ?02/19/2022: Patient was seen at her bedside.  She has no new complaints.  Denies having any pain. ?  ? ?Hospital Course:  ?Principal Problem: ?  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  Hypertensive urgency ?  Delirium ?  Blindness ?  Hypertension ?  Controlled type 2 diabetes mellitus with chronic kidney disease on chronic dialysis ([place]) ?  Anemia in chronic kidney disease (CKD) ?  ESRD (end stage renal disease) ([place]) -  Dialyzes at [facility].  On Monday Wednesday Friday. ?  Peripheral arterial disease ([place]) ?  S/P AKA (above knee  amputation) unilateral, right ([place]) ?  Malnutrition of moderate degree ?  Hyponatremia ? ?Critical limb ischemia of left lower extremity with gangrene  ?Peripheral arterial disease ?S/p fem-pop bypass 02/11/22 by Dr. Virl Cagey ?Will need to follow-up with Dr. Virl Cagey vascular surgery after discharge.   ?Continue aspirin Plavix and Lipitor. ? ?Second toe dry gangrene ?Seen by  vascular surgery, stable per vascular surgery ?Incisions healing well, no edema. ?Follow up with vascular surgery ?  ?New ESRD on HD MWF ?Management per nephrology ?Patient currently has an HD spot ?Patient declines hemodialysis on 02/19/2022. ?Resume your hemodialysis appointments. ? ?Leukocytosis, suspect reactive ?Nonseptic appearing, afebrile. ?Has no new complaints. ?Will cover empirically in the setting of dry gangrene with Keflex and Doxycycline x 5 days. ?Follow-up with your primary care provider and vascular surgery within a week. ?  ?Resolved hypertensive urgency/Essential hypertension ?BP is stable 158/38 ?Currently on amlodipine, Lasix, metoprolol, hydralazine and losartan. ?  ?Resolved delirium ?Has had intermittent episodes of confusion several weeks prior to this presentation.  Palliative ?care has been consulted due to malnutrition, persistent delirium with hypotension during dialysis and possibly ongoing slow decline. ?  ?Resolved hyponatremia ?Serum sodium normalized 137 ?Electrolytes and volume status addressed with hemodialysis. ?  ?Malnutrition of moderate degree ?As evidenced by moderate loss of subcutaneous fat and severe loss of subcutaneous muscle mass in the setting of end-stage renal disease ?Continue to encourage increase in oral protein calorie intake. ?  ?S/P AKA (above knee amputation) unilateral, right ([place]) ?Improving.  At this time the left above-knee amputation stump has improved with antibiotics and healing well.  ?  ?ESRD (end stage renal disease) ([place]) -   ?Dialyzes at [facility].   ?HD on Monday Wednesday Friday. ?Patient has an HD spot ?  ?Anemia of chronic kidney disease (CKD) ?Hemoglobin stable 9.1 ?No overt bleeding. ?Aranesp and Iron per nephrology ?  ?Controlled type 2 diabetes mellitus with chronic kidney disease on chronic dialysis  ?Hemoglobin A1c 5.5 on 11/15/2021. ?  ?Blindness.  Supportive care ?  ?Ethics/goals of care.  Patient appears to have delirium and  failure to thrive with ongoing vascular issues on dialysis.  Overall prognosis seems to be guarded/poor.  Palliative care has been consulted for ongoing discussion.       ?   ? ?  ?Code Status:   ?  Code Status: DNR ?  ?Disposition: Skilled nursing facility ?  ? ?  ?Consultants:  ?Vascular surgery ?Nephrology ?Critical care ?Palliative care ?  ?Procedures:  ?Hemodialysis ?Left iliofemoral endarterectomy, left bypass graft femoral to popliteal artery on 02/11/2022 ?  ?  ? ? ?Discharge Exam: ?BP (!) 158/38   Pulse 80   Temp 98.4 ?F (36.9 ?C) (Oral)   Resp 16   Ht 5\' 1"  (1.549 m)   Wt 45.2 kg   SpO2 97%   BMI 18.83 kg/m?  ?General: 56 y.o. year-old female frail-appearing in no acute distress.   ?Cardiovascular: Regular rate and rhythm with no rubs or gallops. ?Respiratory: Clear to auscultation with no wheezes or rales.  ?Abdomen: Soft nontender nondistended with normal bowel sounds. ?Musculoskeletal: No lower extremity edema.  ?Psychiatry: Mood is appropriate for condition and setting ? ?Discharge Instructions ?You were cared for by a hospitalist during your hospital stay. If you have any questions about your discharge medications or the care you received while you were in the hospital after you are discharged, you can call the unit and asked to speak with the hospitalist on call if the hospitalist  that took care of you is not available. Once you are discharged, your primary care physician will handle any further medical issues. Please note that NO REFILLS for any discharge medications will be authorized once you are discharged, as it is imperative that you return to your primary care physician (or establish a relationship with a primary care physician if you do not have one) for your aftercare needs so that they can reassess your need for medications and monitor your lab values. ? ? ?Allergies as of 02/19/2022   ? ?   Reactions  ? Acyclovir And Related Itching  ? ?  ? ?  ?Medication List  ?  ? ?STOP taking these  medications   ? ?buPROPion 150 MG 12 hr tablet ?Commonly known as: WELLBUTRIN SR ?  ?calcium acetate 667 MG capsule ?Commonly known as: PHOSLO ?  ?labetalol 200 MG tablet ?Commonly known as: NORMODYNE ?  ?pregabalin

## 2022-02-19 NOTE — Progress Notes (Signed)
This chaplain responded to PMT consult for creating/updating the Pt. HCPOA.  ? ?The Pt. participated in [place] education with the chaplain. The chaplain understands the Pt. is requesting time to talk to the person she is thinking about for HCPOA.  The Pt. is planning to have the conversation on Saturday and the chaplain will F/U on Monday. ? ?This chaplain is available for F/U spiritual care as needed.  ? ?Chaplain Sallyanne Kuster ?[phone] ?

## 2022-02-19 NOTE — Progress Notes (Signed)
Notified on-call physician due to patient complaint of leg cramp. Requested muscle relaxant. Will continue to monitor. ?

## 2022-02-19 NOTE — Care Management Important Message (Signed)
Important Message ? ?Patient Details  ?Name: Tammy Moses ?MRN: [id number] ?Date of Birth: 1966-06-06 ? ? ?Medicare Important Message Given:  Yes ? ? ? ? ?[name] ?02/19/2022, 7:40 AM ?

## 2022-02-19 NOTE — Progress Notes (Signed)
[facility] ? ?Subjective  - No new complaints ? ? ?Objective ?(!) 168/53 ?73 ?98.1 ?F (36.7 ?C) (Oral) ?14 ?100% ?No intake or output data in the 24 hours ending 02/19/22 0739 ? ?Doppler peroneal best single, weak PT ?Second toe dry gangrene stable ?Incisions healing well no edema, groin soft and healing well ? ?Assessment/Planning: ?56 y.o. female is s/p left iliofemoral endarterectomy, left femoral to below-knee popliteal artery bypass with ipsilateral non-reversed GSV    ?Previous right AKA ? ?Inflow primarily peroneal, weak PT ?Plan to be followed OP by DPM follow up for second toe care. ?Pending SNF ?F/U with [facility] will be arranged ? ?[name] ?02/19/2022 ?7:39 AM ?-- ? ?Laboratory ?Lab Results: ?Recent Labs  ?  02/17/22 ?0813 02/18/22 ?4585  ?WBC 15.2* 11.9*  ?HGB 9.0* 9.5*  ?HCT 28.6* 30.6*  ?PLT 485* 460*  ? ?BMET ?Recent Labs  ?  02/17/22 ?0813 02/18/22 ?9292  ?NA 131* 137  ?K 4.0 3.8  ?CL 95* 98  ?CO2 23 26  ?GLUCOSE 137* 197*  ?BUN 51* 28*  ?CREATININE 7.90* 5.31*  ?CALCIUM 10.1 9.9  ? ? ?COAG ?Lab Results  ?Component Value Date  ? INR 1.2 10/21/2021  ? INR 1.2 09/16/2021  ? INR 0.88 01/30/2014  ? ?No results found for: PTT ? ? ? ?

## 2022-02-20 ENCOUNTER — Encounter (HOSPITAL_COMMUNITY): Payer: Self-pay

## 2022-02-21 ENCOUNTER — Other Ambulatory Visit: Payer: Self-pay

## 2022-02-21 ENCOUNTER — Emergency Department (HOSPITAL_COMMUNITY): Payer: Medicare Other

## 2022-02-21 ENCOUNTER — Observation Stay (HOSPITAL_COMMUNITY)
Admission: EM | Admit: 2022-02-21 | Discharge: 2022-02-23 | Disposition: A | Discharge status: Hospice | Payer: Medicare Other | Attending: Family Medicine | Admitting: Family Medicine

## 2022-02-21 ENCOUNTER — Encounter (HOSPITAL_COMMUNITY): Payer: Self-pay | Admitting: Emergency Medicine

## 2022-02-21 DIAGNOSIS — I120 Hypertensive chronic kidney disease with stage 5 chronic kidney disease or end stage renal disease: Secondary | ICD-10-CM | POA: Diagnosis not present

## 2022-02-21 DIAGNOSIS — E871 Hypo-osmolality and hyponatremia: Secondary | ICD-10-CM | POA: Diagnosis not present

## 2022-02-21 DIAGNOSIS — Z87891 Personal history of nicotine dependence: Secondary | ICD-10-CM | POA: Diagnosis not present

## 2022-02-21 DIAGNOSIS — Z794 Long term (current) use of insulin: Secondary | ICD-10-CM | POA: Insufficient documentation

## 2022-02-21 DIAGNOSIS — G9341 Metabolic encephalopathy: Secondary | ICD-10-CM | POA: Diagnosis not present

## 2022-02-21 DIAGNOSIS — R410 Disorientation, unspecified: Secondary | ICD-10-CM | POA: Diagnosis present

## 2022-02-21 DIAGNOSIS — Z7902 Long term (current) use of antithrombotics/antiplatelets: Secondary | ICD-10-CM | POA: Insufficient documentation

## 2022-02-21 DIAGNOSIS — D72829 Elevated white blood cell count, unspecified: Secondary | ICD-10-CM | POA: Diagnosis not present

## 2022-02-21 DIAGNOSIS — I70462 Atherosclerosis of autologous vein bypass graft(s) of the extremities with gangrene, left leg: Secondary | ICD-10-CM | POA: Insufficient documentation

## 2022-02-21 DIAGNOSIS — E1122 Type 2 diabetes mellitus with diabetic chronic kidney disease: Secondary | ICD-10-CM | POA: Diagnosis not present

## 2022-02-21 DIAGNOSIS — Z992 Dependence on renal dialysis: Secondary | ICD-10-CM | POA: Insufficient documentation

## 2022-02-21 DIAGNOSIS — Z91199 Patient's noncompliance with other medical treatment and regimen due to unspecified reason: Secondary | ICD-10-CM | POA: Insufficient documentation

## 2022-02-21 DIAGNOSIS — N186 End stage renal disease: Secondary | ICD-10-CM | POA: Diagnosis not present

## 2022-02-21 DIAGNOSIS — Z79899 Other long term (current) drug therapy: Secondary | ICD-10-CM | POA: Diagnosis not present

## 2022-02-21 DIAGNOSIS — Z7982 Long term (current) use of aspirin: Secondary | ICD-10-CM | POA: Diagnosis not present

## 2022-02-21 DIAGNOSIS — R627 Adult failure to thrive: Secondary | ICD-10-CM | POA: Diagnosis present

## 2022-02-21 DIAGNOSIS — E875 Hyperkalemia: Secondary | ICD-10-CM

## 2022-02-21 DIAGNOSIS — E43 Unspecified severe protein-calorie malnutrition: Secondary | ICD-10-CM | POA: Diagnosis not present

## 2022-02-21 DIAGNOSIS — R770 Abnormality of albumin: Secondary | ICD-10-CM | POA: Diagnosis not present

## 2022-02-21 DIAGNOSIS — I10 Essential (primary) hypertension: Secondary | ICD-10-CM | POA: Diagnosis present

## 2022-02-21 HISTORY — DX: Dependence on renal dialysis: N18.6

## 2022-02-21 LAB — CBC WITH DIFFERENTIAL/PLATELET
Abs Immature Granulocytes: 0.05 10*3/uL (ref 0.00–0.07)
Basophils Absolute: 0.2 10*3/uL — ABNORMAL HIGH (ref 0.0–0.1)
Basophils Relative: 1 %
Eosinophils Absolute: 0.3 10*3/uL (ref 0.0–0.5)
Eosinophils Relative: 2 %
HCT: 31.9 % — ABNORMAL LOW (ref 36.0–46.0)
Hemoglobin: 9.7 g/dL — ABNORMAL LOW (ref 12.0–15.0)
Immature Granulocytes: 0 %
Lymphocytes Relative: 17 %
Lymphs Abs: 2.4 10*3/uL (ref 0.7–4.0)
MCH: 27.3 pg (ref 26.0–34.0)
MCHC: 30.4 g/dL (ref 30.0–36.0)
MCV: 89.9 fL (ref 80.0–100.0)
Monocytes Absolute: 1.5 10*3/uL — ABNORMAL HIGH (ref 0.1–1.0)
Monocytes Relative: 11 %
Neutro Abs: 9.5 10*3/uL — ABNORMAL HIGH (ref 1.7–7.7)
Neutrophils Relative %: 69 %
Platelets: 540 10*3/uL — ABNORMAL HIGH (ref 150–400)
RBC: 3.55 MIL/uL — ABNORMAL LOW (ref 3.87–5.11)
RDW: 20.7 % — ABNORMAL HIGH (ref 11.5–15.5)
WBC: 13.9 10*3/uL — ABNORMAL HIGH (ref 4.0–10.5)
nRBC: 0 % (ref 0.0–0.2)

## 2022-02-21 LAB — COMPREHENSIVE METABOLIC PANEL
ALT: 9 U/L (ref 0–44)
AST: 28 U/L (ref 15–41)
Albumin: 2.7 g/dL — ABNORMAL LOW (ref 3.5–5.0)
Alkaline Phosphatase: 81 U/L (ref 38–126)
Anion gap: 18 — ABNORMAL HIGH (ref 5–15)
BUN: 79 mg/dL — ABNORMAL HIGH (ref 6–20)
CO2: 21 mmol/L — ABNORMAL LOW (ref 22–32)
Calcium: 11.2 mg/dL — ABNORMAL HIGH (ref 8.9–10.3)
Chloride: 96 mmol/L — ABNORMAL LOW (ref 98–111)
Creatinine, Ser: 11.49 mg/dL — ABNORMAL HIGH (ref 0.44–1.00)
GFR, Estimated: 4 mL/min — ABNORMAL LOW (ref >60)
Glucose, Bld: 138 mg/dL — ABNORMAL HIGH (ref 70–99)
Potassium: 5.6 mmol/L — ABNORMAL HIGH (ref 3.5–5.1)
Sodium: 135 mmol/L (ref 135–145)
Total Bilirubin: 0.4 mg/dL (ref 0.3–1.2)
Total Protein: 7.4 g/dL (ref 6.5–8.1)

## 2022-02-21 LAB — I-STAT CHEM 8, ED
BUN: 73 mg/dL — ABNORMAL HIGH (ref 6–20)
Calcium, Ion: 1.27 mmol/L (ref 1.15–1.40)
Chloride: 103 mmol/L (ref 98–111)
Creatinine, Ser: 12.3 mg/dL — ABNORMAL HIGH (ref 0.44–1.00)
Glucose, Bld: 135 mg/dL — ABNORMAL HIGH (ref 70–99)
HCT: 32 % — ABNORMAL LOW (ref 36.0–46.0)
Hemoglobin: 10.9 g/dL — ABNORMAL LOW (ref 12.0–15.0)
Potassium: 5.4 mmol/L — ABNORMAL HIGH (ref 3.5–5.1)
Sodium: 133 mmol/L — ABNORMAL LOW (ref 135–145)
TCO2: 22 mmol/L (ref 22–32)

## 2022-02-21 MED ORDER — SODIUM CHLORIDE 0.9 % IV BOLUS
500.0000 mL | Freq: Once | INTRAVENOUS | Status: AC
Start: 2022-02-21 — End: 2022-02-22
  Administered 2022-02-21: 500 mL via INTRAVENOUS

## 2022-02-21 MED ORDER — SODIUM ZIRCONIUM CYCLOSILICATE 10 G PO PACK
10.0000 g | PACK | Freq: Once | ORAL | Status: AC
  Administered 2022-02-21: 10 g via ORAL
  Filled 2022-02-21: qty 1

## 2022-02-21 NOTE — ED Triage Notes (Signed)
HD pt brought to ED by [facility] from home after been dc from the hospital on Friday, per family pt is not eating is refusing treatment and they have concern of a wound on left leg, pt denies any pain at this time. BP 128/40, HR 80. 95% RA CBG 150 ?

## 2022-02-21 NOTE — ED Provider Notes (Addendum)
?[facility] ?Provider Note ? ? ?CSN: [id number] ?Arrival date & time: 02/21/22  2014 ? ?  ? ?History ? ?Chief Complaint  ?Patient presents with  ? Failure To Thrive  ? ? ?Tammy Moses is a 56 y.o. female hx of ESRD on HD (last HD was in the hospital several days ago), R BKA, L fem/pop bypass here with failure to thrive. Patient was admitted to the hospital for hypertensive urgency and just had L fem/pop bypass. Patient was refusing to go to dialysis while she was in the hospital. She  just went home and lives at home with niece. Per the niece, patient refused to eat and refused to take medicines. She called Education officer, museum who suggested that patient come to the ED for evaluation. She is requesting feeding tube.  ? ?The history is provided by the patient, the EMS personnel and a relative.  ? ?  ? ?Home Medications ?Prior to Admission medications   ?Medication Sig Start Date End Date Taking? Authorizing Provider  ?acetaminophen (TYLENOL) 325 MG tablet Take 2 tablets (650 mg total) by mouth every 4 (four) hours as needed for headache or mild pain. 11/06/21   Samella Parr, NP  ?albuterol (VENTOLIN HFA) 108 (90 Base) MCG/ACT inhaler Inhale 2 puffs into the lungs every 4 (four) hours as needed for shortness of breath. For shortness of breath ?Patient taking differently: Inhale 2 puffs into the lungs every 4 (four) hours as needed for shortness of breath or wheezing. 12/11/19   [name], FNP  ?amLODipine (NORVASC) 10 MG tablet Take 1 tablet (10 mg total) by mouth daily. 11/06/21   Samella Parr, NP  ?aspirin EC 81 MG EC tablet Take 1 tablet (81 mg total) by mouth daily. Swallow whole. 02/20/22 05/21/22  Kayleen Memos, DO  ?atorvastatin (LIPITOR) 80 MG tablet Take 1 tablet (80 mg total) by mouth daily. 11/06/21   Samella Parr, NP  ?atropine 1 % ophthalmic solution Place 1 drop into both eyes 2 (two) times daily. (0900 & 2100)    [provider]  ?b  complex-vitamin c-folic acid (NEPHRO-VITE) 0.8 MG TABS tablet Take 1 tablet by mouth at bedtime.    [provider]  ?cephALEXin (KEFLEX) 250 MG capsule Take 1 capsule (250 mg total) by mouth 2 (two) times daily for 5 days. 02/19/22 02/24/22  Kayleen Memos, DO  ?clopidogrel (PLAVIX) 75 MG tablet Take 1 tablet (75 mg total) by mouth daily at 6 (six) AM. 02/20/22 05/21/22  Kayleen Memos, DO  ?dorzolamide-timolol (COSOPT) 22.3-6.8 MG/ML ophthalmic solution Place 1 drop into both eyes 2 (two) times daily. 12/08/20   [provider]  ?doxycycline (VIBRAMYCIN) 50 MG capsule Take 1 capsule (50 mg total) by mouth 2 (two) times daily for 5 days. 02/19/22 02/24/22  Kayleen Memos, DO  ?furosemide (LASIX) 40 MG tablet Take 40 mg by mouth 2 (two) times daily. (0800 & 2000) 11/10/21   [provider]  ?hydrALAZINE (APRESOLINE) 100 MG tablet Take 1 tablet (100 mg total) by mouth every 8 (eight) hours. ?Patient taking differently: Take 100 mg by mouth every 8 (eight) hours. (0800, 1400 & 2200) 10/09/21   Shelly Coss, MD  ?insulin lispro (HUMALOG) 100 UNIT/ML injection Inject 2-10 Units into the skin 3 (three) times daily before meals. Inject as per sliding scale ?150-200=2 units ?201-250=4 units ?251-300=6 units ?301-350=8 units ?351-400=10 units    [provider]  ?latanoprost (XALATAN) 0.005 % ophthalmic  solution Place 1 drop into both eyes at bedtime. (2100) 12/16/20   [provider]  ?losartan (COZAAR) 100 MG tablet Take 1 tablet (100 mg total) by mouth daily. 11/06/21   Samella Parr, NP  ?metoCLOPramide (REGLAN) 10 MG tablet Take 10 mg by mouth in the morning and at bedtime. (0800 & 2000) 09/22/21   [provider]  ?metoprolol tartrate (LOPRESSOR) 50 MG tablet Take 1 tablet (50 mg total) by mouth 2 (two) times daily. 02/19/22 03/21/22  Kayleen Memos, DO  ?oxyCODONE-acetaminophen (PERCOCET/ROXICET) 5-325 MG tablet Take 1 tablet by mouth every 6 (six) hours as needed for  severe pain. 02/02/22   Baglia, Corrina, PA-C  ?pantoprazole (PROTONIX) 40 MG tablet Take 1 tablet (40 mg total) by mouth daily. 02/20/22 05/21/22  Kayleen Memos, DO  ?senna-docusate (SENOKOT-S) 8.6-50 MG tablet Take 1 tablet by mouth at bedtime as needed for mild constipation. ?Patient taking differently: Take 1 tablet by mouth daily as needed for mild constipation. 11/06/21   Samella Parr, NP  ?sevelamer carbonate (RENVELA) 800 MG tablet Take 800-2,400 mg by mouth See admin instructions. Take 3 tablets (2400 mg) by mouth 3 time daily with meals (0800, 1300 & 1700) + Take 1 tablet (800 mg) by mouth as needed for ESRD with snacks.    [provider]  ?   ? ?Allergies    ?Acyclovir and related   ? ?Review of Systems   ?Review of Systems  ?Skin:  Positive for wound.  ?Psychiatric/Behavioral:  Positive for confusion.   ?All other systems reviewed and are negative. ? ?Physical Exam ?Updated Vital Signs ?Ht 5\' 1"  (1.549 m)   Wt 45.2 kg   BMI 18.83 kg/m?  ?Physical Exam ?Vitals and nursing note reviewed.  ?Constitutional:   ?   Comments: Confused   ?HENT:  ?   Head: Normocephalic.  ?   Mouth/Throat:  ?   Mouth: Mucous membranes are dry.  ?Eyes:  ?   Extraocular Movements: Extraocular movements intact.  ?   Pupils: Pupils are equal, round, and reactive to light.  ?Cardiovascular:  ?   Rate and Rhythm: Normal rate and regular rhythm.  ?   Pulses: Normal pulses.  ?   Heart sounds: Normal heart sounds.  ?Pulmonary:  ?   Effort: Pulmonary effort is normal.  ?   Breath sounds: Normal breath sounds.  ?Abdominal:  ?   General: Abdomen is flat.  ?   Palpations: Abdomen is soft.  ?Musculoskeletal:     ?   General: Normal range of motion.  ?   Cervical back: Normal range of motion and neck supple.  ?   Comments: R BKA. L fem/pop bypass. Patient's L foot has good DP pulse, no obvious cellulitis.   ?Skin: ?   General: Skin is warm.  ?   Capillary Refill: Capillary refill takes less than 2 seconds.  ?Neurological:  ?    Comments: Confusion   ? ? ?ED Results / Procedures / Treatments   ?Labs ?(all labs ordered are listed, but only abnormal results are displayed) ?Labs Reviewed  ?CBC WITH DIFFERENTIAL/PLATELET  ?COMPREHENSIVE METABOLIC PANEL  ?I-STAT CHEM 8, ED  ? ? ?EKG ?None ? ?Radiology ?No results found. ? ?Procedures ?Procedures  ? ? ?Medications Ordered in ED ?Medications - No data to display ? ?ED Course/ Medical Decision Making/ A&P ?  ?                        ?  Medical Decision Making ?TAYRA DAWE is a 56 y.o. female here with L foot pain, failure to thrive. Patient has been refusing to eat and refusing to get dialysis. Patient's niece called Education officer, museum. Patient is agreeable for G tube. Will get labs and CXR. I had long discussion with niece and she felt that patient has no capacity to make decisions. However, she has no formal paperwork to be the guardian. Will likely need to get social work and palliative care involved as well.  We will check CBC and CMP and do CT head and will likely need admission ? ? ?9:24 PM ?Patient's potassium is 5.4.  I ordered Lokelma.  Chest x-ray and left foot x-ray unremarkable.  CT head did not show a bleed.  I discussed case with Dr. Unk Lightning from vascular surgery.  He knows the patient well and recently did the femoropopliteal bypass.  He was able to get a good pulse on the leg.  He states that he would recommend amputation but patient consistently refused.  Patient still refuses dialysis.  We will give [facility] for now.  I messaged nephrology.  Patient will likely need goals of care discussion while she is in the hospital to determine the best treatment plan for her. ? ?9:40 PM ?Discussed case with Dr. Alcario Drought from hospitalist.  He wants to make sure that patient wants treatment before admitting patient.  I talked to the patient again.  She is agreeable for dialysis tomorrow and took the [facility] in the ED.  She does not want any amputation however.  I messaged Dr. Alcario Drought and he states  that he will talk to the patient as well. ? ?10:46 PM ?Dr. Alcario Drought saw patient and contacted Dr. Johnney Ou from nephrology. She is not very certain about dialysis to Dr. Alcario Drought.  He will talk to patient further regarding admiss

## 2022-02-21 NOTE — Progress Notes (Signed)
Ta was seen and examined in the trauma bay. ?Left lower extremity bypass patent, with multiphasic Doppler signal in the peroneal artery. There was a small amount of purulence appreciated between the toes. ? ?While the bypass has improved lower extremity perfusion, The toes continue to worsen.  This is likely due to her metabolic state, noncompliance, multiple comorbidities. ? ?Should her wounds continue to progress, the only option would be amputation. Tammy Moses was adamant, she would never pursue amputation. ? ?Recommend keeping the foot dry, with dry dressings placed between the toes. Please call if Leisha becomes open to amputation discussion.   ? ?Recommend palliative care discussions as well as that she is refusing dialysis. ? ?Broadus John MD ?

## 2022-02-22 ENCOUNTER — Encounter (HOSPITAL_COMMUNITY): Payer: Self-pay | Admitting: Internal Medicine

## 2022-02-22 DIAGNOSIS — Z794 Long term (current) use of insulin: Secondary | ICD-10-CM

## 2022-02-22 DIAGNOSIS — E1122 Type 2 diabetes mellitus with diabetic chronic kidney disease: Secondary | ICD-10-CM

## 2022-02-22 DIAGNOSIS — N186 End stage renal disease: Secondary | ICD-10-CM

## 2022-02-22 DIAGNOSIS — E875 Hyperkalemia: Secondary | ICD-10-CM

## 2022-02-22 DIAGNOSIS — I70462 Atherosclerosis of autologous vein bypass graft(s) of the extremities with gangrene, left leg: Secondary | ICD-10-CM

## 2022-02-22 DIAGNOSIS — I10 Essential (primary) hypertension: Secondary | ICD-10-CM

## 2022-02-22 DIAGNOSIS — Z992 Dependence on renal dialysis: Secondary | ICD-10-CM

## 2022-02-22 DIAGNOSIS — R410 Disorientation, unspecified: Secondary | ICD-10-CM

## 2022-02-22 LAB — CBC
HCT: 32.4 % — ABNORMAL LOW (ref 36.0–46.0)
Hemoglobin: 9.8 g/dL — ABNORMAL LOW (ref 12.0–15.0)
MCH: 27.2 pg (ref 26.0–34.0)
MCHC: 30.2 g/dL (ref 30.0–36.0)
MCV: 90 fL (ref 80.0–100.0)
Platelets: 522 10*3/uL — ABNORMAL HIGH (ref 150–400)
RBC: 3.6 MIL/uL — ABNORMAL LOW (ref 3.87–5.11)
RDW: 20.8 % — ABNORMAL HIGH (ref 11.5–15.5)
WBC: 14.6 10*3/uL — ABNORMAL HIGH (ref 4.0–10.5)
nRBC: 0 % (ref 0.0–0.2)

## 2022-02-22 LAB — BASIC METABOLIC PANEL
Anion gap: 19 — ABNORMAL HIGH (ref 5–15)
BUN: 83 mg/dL — ABNORMAL HIGH (ref 6–20)
CO2: 22 mmol/L (ref 22–32)
Calcium: 10.8 mg/dL — ABNORMAL HIGH (ref 8.9–10.3)
Chloride: 94 mmol/L — ABNORMAL LOW (ref 98–111)
Creatinine, Ser: 11.88 mg/dL — ABNORMAL HIGH (ref 0.44–1.00)
GFR, Estimated: 3 mL/min — ABNORMAL LOW (ref >60)
Glucose, Bld: 90 mg/dL (ref 70–99)
Potassium: 5.2 mmol/L — ABNORMAL HIGH (ref 3.5–5.1)
Sodium: 135 mmol/L (ref 135–145)

## 2022-02-22 LAB — CBG MONITORING, ED
Glucose-Capillary: 84 mg/dL (ref 70–99)
Glucose-Capillary: 92 mg/dL (ref 70–99)
Glucose-Capillary: 77 mg/dL (ref 70–99)
Glucose-Capillary: 94 mg/dL (ref 70–99)

## 2022-02-22 LAB — GLUCOSE, CAPILLARY
Glucose-Capillary: 96 mg/dL (ref 70–99)
Glucose-Capillary: 133 mg/dL — ABNORMAL HIGH (ref 70–99)

## 2022-02-22 MED ORDER — PANTOPRAZOLE SODIUM 40 MG PO TBEC
40.0000 mg | DELAYED_RELEASE_TABLET | Freq: Every day | ORAL | Status: DC
Start: 2022-02-22 — End: 2022-02-23
  Administered 2022-02-22 – 2022-02-23 (×2): 40 mg via ORAL
  Filled 2022-02-22 (×2): qty 1

## 2022-02-22 MED ORDER — CHLORHEXIDINE GLUCONATE CLOTH 2 % EX PADS
6.0000 | MEDICATED_PAD | Freq: Every day | CUTANEOUS | Status: DC
  Administered 2022-02-23: 6 via TOPICAL

## 2022-02-22 MED ORDER — LATANOPROST 0.005 % OP SOLN
1.0000 [drp] | Freq: Every day | OPHTHALMIC | Status: DC
  Administered 2022-02-22: 1 [drp] via OPHTHALMIC
  Filled 2022-02-22: qty 2.5

## 2022-02-22 MED ORDER — TRAMADOL HCL 50 MG PO TABS
50.0000 mg | ORAL_TABLET | Freq: Two times a day (BID) | ORAL | Status: DC | PRN
  Filled 2022-02-22: qty 1

## 2022-02-22 MED ORDER — DORZOLAMIDE HCL-TIMOLOL MAL 2-0.5 % OP SOLN
1.0000 [drp] | Freq: Two times a day (BID) | OPHTHALMIC | Status: DC
  Administered 2022-02-22: 1 [drp] via OPHTHALMIC
  Filled 2022-02-22: qty 10

## 2022-02-22 MED ORDER — METOPROLOL TARTRATE 50 MG PO TABS
50.0000 mg | ORAL_TABLET | Freq: Two times a day (BID) | ORAL | Status: DC
  Administered 2022-02-22 – 2022-02-23 (×3): 50 mg via ORAL
  Filled 2022-02-22 (×3): qty 1

## 2022-02-22 MED ORDER — OXYCODONE HCL 5 MG PO TABS
5.0000 mg | ORAL_TABLET | Freq: Four times a day (QID) | ORAL | Status: DC | PRN
  Administered 2022-02-22 – 2022-02-23 (×2): 5 mg via ORAL
  Filled 2022-02-22 (×2): qty 1

## 2022-02-22 MED ORDER — FUROSEMIDE 40 MG PO TABS
40.0000 mg | ORAL_TABLET | Freq: Two times a day (BID) | ORAL | Status: DC
  Administered 2022-02-22 – 2022-02-23 (×2): 40 mg via ORAL
  Filled 2022-02-22 (×2): qty 1

## 2022-02-22 MED ORDER — LOSARTAN POTASSIUM 50 MG PO TABS
100.0000 mg | ORAL_TABLET | Freq: Every day | ORAL | Status: DC
  Administered 2022-02-22 – 2022-02-23 (×2): 100 mg via ORAL
  Filled 2022-02-22 (×2): qty 2

## 2022-02-22 MED ORDER — ACETAMINOPHEN 325 MG PO TABS
650.0000 mg | ORAL_TABLET | Freq: Four times a day (QID) | ORAL | Status: DC | PRN
  Filled 2022-02-22: qty 2

## 2022-02-22 MED ORDER — HEPARIN SODIUM (PORCINE) 5000 UNIT/ML IJ SOLN
5000.0000 [IU] | Freq: Three times a day (TID) | INTRAMUSCULAR | Status: DC
  Administered 2022-02-22 – 2022-02-23 (×3): 5000 [IU] via SUBCUTANEOUS
  Filled 2022-02-22 (×4): qty 1

## 2022-02-22 MED ORDER — CEPHALEXIN 250 MG PO CAPS
250.0000 mg | ORAL_CAPSULE | Freq: Two times a day (BID) | ORAL | Status: DC
  Administered 2022-02-22 – 2022-02-23 (×3): 250 mg via ORAL
  Filled 2022-02-22 (×3): qty 1

## 2022-02-22 MED ORDER — ONDANSETRON HCL 4 MG/2ML IJ SOLN: 4.0000 mg | Freq: Four times a day (QID) | INTRAMUSCULAR | Status: DC | PRN

## 2022-02-22 MED ORDER — ACETAMINOPHEN 650 MG RE SUPP: 650.0000 mg | Freq: Four times a day (QID) | RECTAL | Status: DC | PRN

## 2022-02-22 MED ORDER — ATORVASTATIN CALCIUM 80 MG PO TABS
80.0000 mg | ORAL_TABLET | Freq: Every day | ORAL | Status: DC
  Administered 2022-02-22 – 2022-02-23 (×2): 80 mg via ORAL
  Filled 2022-02-22 (×2): qty 1

## 2022-02-22 MED ORDER — LORAZEPAM 2 MG/ML IJ SOLN: 0.5000 mg | INTRAMUSCULAR | Status: DC | PRN

## 2022-02-22 MED ORDER — DOXYCYCLINE HYCLATE 50 MG PO CAPS
50.0000 mg | ORAL_CAPSULE | Freq: Two times a day (BID) | ORAL | Status: DC
  Administered 2022-02-22 – 2022-02-23 (×3): 50 mg via ORAL
  Filled 2022-02-22 (×3): qty 1

## 2022-02-22 MED ORDER — LORAZEPAM 0.5 MG PO TABS
0.5000 mg | ORAL_TABLET | ORAL | Status: DC | PRN
  Administered 2022-02-22 – 2022-02-23 (×3): 0.5 mg via ORAL
  Filled 2022-02-22 (×3): qty 1

## 2022-02-22 MED ORDER — ONDANSETRON HCL 4 MG PO TABS: 4.0000 mg | ORAL_TABLET | Freq: Four times a day (QID) | ORAL | Status: DC | PRN

## 2022-02-22 MED ORDER — ATROPINE SULFATE 1 % OP SOLN
1.0000 [drp] | Freq: Two times a day (BID) | OPHTHALMIC | Status: DC
  Administered 2022-02-22: 1 [drp] via OPHTHALMIC
  Filled 2022-02-22: qty 2

## 2022-02-22 MED ORDER — HYDRALAZINE HCL 25 MG PO TABS
100.0000 mg | ORAL_TABLET | Freq: Three times a day (TID) | ORAL | Status: DC
  Administered 2022-02-22 (×2): 100 mg via ORAL
  Filled 2022-02-22 (×3): qty 4

## 2022-02-22 MED ORDER — INSULIN ASPART 100 UNIT/ML IJ SOLN: 0.0000 [IU] | INTRAMUSCULAR | Status: DC

## 2022-02-22 MED ORDER — CLOPIDOGREL BISULFATE 75 MG PO TABS
75.0000 mg | ORAL_TABLET | Freq: Every day | ORAL | Status: DC
Start: 2022-02-23 — End: 2022-02-23
  Filled 2022-02-22: qty 1

## 2022-02-22 MED ORDER — ASPIRIN EC 81 MG PO TBEC
81.0000 mg | DELAYED_RELEASE_TABLET | Freq: Every day | ORAL | Status: DC
  Administered 2022-02-22 – 2022-02-23 (×2): 81 mg via ORAL
  Filled 2022-02-22 (×2): qty 1

## 2022-02-22 MED ORDER — AMLODIPINE BESYLATE 10 MG PO TABS
10.0000 mg | ORAL_TABLET | Freq: Every day | ORAL | Status: DC
Start: 2022-02-22 — End: 2022-02-23
  Administered 2022-02-22 – 2022-02-23 (×2): 10 mg via ORAL
  Filled 2022-02-22 (×2): qty 1

## 2022-02-22 NOTE — Progress Notes (Signed)
Patient is refusing to keep cardiac telemetry monitor on  provider aware ?

## 2022-02-22 NOTE — Assessment & Plan Note (Addendum)
Pt with multiple recent missed dialysis sessions d/t refusal. ?May or may not have capacity to refuse. ?1. Pal care consult ?2. Nephrology will come discuss with patient in AM ?3. May or may not want psych consult in AM for capacity evaluation (will defer to day team and pal care on this). ?4. No emergent dialysis needs for tonight ?5. For the moment, patient has agreed voluntarily to let me admit her to the hospital for tonight, so we can get this sorted out in the morning ?6. If she continues to refuse dialysis, then likely needs full comfort measures / hospice treatment at that point. ?

## 2022-02-22 NOTE — ED Notes (Signed)
Patient has removed IV to right posterior forearm and removed all monitoring equipment. Patient reoriented and informed of need for monitoring equipment to remain on.  ?

## 2022-02-22 NOTE — Patient Outreach (Signed)
[facility]) Care Management ? ?02/22/2022 ? ?Tammy Moses ?20-Dec-1965 ?[id number] ? ? ?Received hospital referral from Natividad Brood, [place] for [place] [facility], complex care and disease management follow up calls and assess for further needs. Assigned to Raina Mina, RN care coordinator for follow up. ? ?Philmore Pali ?[place] Management Assistant ?[phone] ? ?

## 2022-02-22 NOTE — Progress Notes (Addendum)
Pt was agreeable to HD when discussing it in the ED room.  However on arrival to dialysis when we were going to stick her AVF she began to yell and shout and refuse to be stuck and refused dialysis. Our staff tried to encourage her to no avail and she got very upset. We will not be able to do dialysis today. Have d/w the pt's niece Jeralene Huff who would like to sit w/ her in HD tomorrow and give this another try.  I will d/w HD staff.   ? ?Kelly Splinter, MD ?02/22/2022, 12:50 PM ? ? ? ? ?

## 2022-02-22 NOTE — Progress Notes (Signed)
?   02/22/22 1516  ?Assess: MEWS Score  ?Temp (!) 97.5 ?F (36.4 ?C)  ?BP (!) 230/95  ?Pulse Rate 88  ?Resp 16  ?SpO2 98 %  ?O2 Device Room Air  ?Assess: MEWS Score  ?MEWS Temp 0  ?MEWS Systolic 2  ?MEWS Pulse 0  ?MEWS RR 0  ?MEWS LOC 0  ?MEWS Score 2  ?MEWS Score Color Yellow  ?Assess: if the MEWS score is Yellow or Red  ?Were vital signs taken at a resting state? No  ?Early Detection of Sepsis Score *See Row Information* Low  ?MEWS guidelines implemented *See Row Information* Yes  ?Treat  ?MEWS Interventions Administered scheduled meds/treatments  ?Pain Scale 0-10  ?Pain Score 10  ?Pain Location Leg  ?Pain Orientation Right  ?Pain Intervention(s) Medication (See eMAR)  ?Take Vital Signs  ?Increase Vital Sign Frequency  Yellow: Q 2hr X 2 then Q 4hr X 2, if remains yellow, continue Q 4hrs  ?Escalate  ?MEWS: Escalate Yellow: discuss with charge nurse/RN and consider discussing with provider and RRT  ?Notify: Charge Nurse/RN  ?Name of Charge Nurse/RN Notified Candice RN  ?Date Charge Nurse/RN Notified 02/22/22  ?Time Charge Nurse/RN Notified 1520  ?Notify: Provider  ?Provider Name/Title Bonner Puna MD  ?Date Provider Notified 02/22/22  ?Time Provider Notified 1555  ?Notification Type Page  ?Notification Reason Red med refusal;Other (Comment) ?(yellow mews)  ?Provider response Other (Comment) ?(continue to observe)  ?Date of Provider Response 02/22/22  ?Time of Provider Response 1555  ?Document  ?Patient Outcome Other (Comment) ?(continue to observe)  ?Progress note created (see row info) Yes  ? ? ?

## 2022-02-22 NOTE — Progress Notes (Addendum)
Patient refused to put on gown r came to floor from ED on a stretcher  no iv access  skin intact  right BKA  Vitals done ? ?Patient did not want [place] touching her much. Wanted to go to sleep  gave meds and let patient sleep ?

## 2022-02-22 NOTE — ED Notes (Signed)
Pt pulled out IV access on night shift, this RN attempted IV access and pt refused  ?

## 2022-02-22 NOTE — Assessment & Plan Note (Addendum)
Cont SSI ?Will make this Q4H since pt refusing to eat at the moment and PTA. ?

## 2022-02-22 NOTE — ED Notes (Signed)
Pt refusing to wear cardiac monitor leads  ?

## 2022-02-22 NOTE — Assessment & Plan Note (Signed)
Cont home BP meds ?

## 2022-02-22 NOTE — Progress Notes (Signed)
Pt receives out-pt HD at [facility] in [facility] on MWF. Pt has an 11:10 chair time. Clinic advised pt has presented to hospital and will advise clinic of d/c date once know. Will assist as needed.  ? ?Melven Sartorius ?Renal Navigator ?[phone] ?

## 2022-02-22 NOTE — ED Notes (Signed)
Breakfast Order Placed ?

## 2022-02-22 NOTE — Progress Notes (Signed)
At last admission( in April)  , patient was recommended for SNF. However, in attempt to get approval , CSW was informed, by [facility], they no longer manages the patient Health Care plan , it ended in March.  ?CSW spoke with the patient's  niece , Jeralene Huff, she advised the patient's health care plan was changed  to long term care at [facility]- she was there in March 2023( stayed for almost a month). Per family report, they were not satisfied with the care at the SNF and decided to take the  patient home. Her niece, Jeralene Huff, states she called [employer] and requested her plan be changes back over to "regular [employer]". CSW informed the family to call insurance to determine effective date for change.  ? ?CSW spoke with [facility] and [place] to get more insight of insurance, both SNFs explained not having the proper insurance coverage is a barrier. Claiborne Billings w/ [facility] confirmed she was with [facility] optum plan and she would NOT be able to discharge to SNF for short term rehab. Family informed.  ? ?CSW updated CSW ReAndra ? ?Thurmond Butts, MSW, LCSW ?Clinical Social Worker ? ? ?

## 2022-02-22 NOTE — H&P (Signed)
History and Physical    Patient: Tammy Moses ZDG:[id number] DOB: 11/09/1965 DOA: 02/21/2022 DOS: the patient was seen and examined on 02/22/2022 PCP: Kallie Locks, FNP  Patient coming from: Home  Chief Complaint:  Chief Complaint  Patient presents with   Failure To Thrive   HPI: Tammy Moses is a 56 y.o. 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  Pt admitted 4/13 up until 2 days ago 4/28 with gangrene of L great toe.  Underwent L fem-pop bypass in attempt to limb salvage.  Pt was pretty adamant that she would never accept another amputation.  Post-op pt had failed to thrive with poor PO intake and refused dialysis sessions stating she didn't want to do dialysis.  Pt discharged on 4/28.  Since going home she has continued to not eat and refuse to go to dialysis per Niece.  Niece brought her in to ED requesting that we dialyze patient and put a feeding tube in.   Review of Systems: As mentioned in the history of present illness. All other systems reviewed and are negative. Past Medical History:  Diagnosis Date   Anemia    Anxiety    Blind right eye    Chronic kidney disease, stage V (HCC)    Dialysis M-W-F   Confusion    at times   Constipation, chronic    Dental caries    Depression    Diabetes mellitus    Diabetic neuropathy (HCC)    Diabetic retinopathy    GERD (gastroesophageal reflux disease)    Glaucoma    H. pylori infection    Hepatitis C carrier (HCC)    High risk sexual behavior    Hyperlipidemia    Hypertension    Hypoglycemia 07/12/2017   Insomnia    Microalbuminuria    Nonspecific elevation of levels of transaminase or lactic acid dehydrogenase (LDH)    Tobacco dependence    Vitamin D deficiency    Past Surgical History:  Procedure Laterality Date   ABDOMINAL AORTOGRAM W/LOWER EXTREMITY N/A 10/27/2021   Procedure: ABDOMINAL AORTOGRAM W/LOWER EXTREMITY;  Surgeon: Nada Libman, MD;  Location: [facility]  INVASIVE CV LAB;  Service: Cardiovascular;  Laterality: N/A;   ABDOMINAL AORTOGRAM W/LOWER EXTREMITY N/A 02/10/2022   Procedure: ABDOMINAL AORTOGRAM W/LOWER EXTREMITY;  Surgeon: Victorino Sparrow, MD;  Location: [facility] INVASIVE CV LAB;  Service: Cardiovascular;  Laterality: N/A;  Left   ABDOMINAL HYSTERECTOMY  04/30/2009   PARTIAL   AMPUTATION Right 10/30/2021   Procedure: RIGHT GREAT TOE AMPUTATION;  Surgeon: Nada Libman, MD;  Location: [facility] OR;  Service: Vascular;  Laterality: Right;   AMPUTATION Right 12/21/2021   Procedure: RIGHT ABOVE KNEE AMPUTATION;  Surgeon: Cephus Shelling, MD;  Location: [facility] OR;  Service: Vascular;  Laterality: Right;   COLONOSCOPY     DIALYSIS FISTULA CREATION Left 06/2017   ENDARTERECTOMY FEMORAL Right 10/30/2021   Procedure: RIGHT FEMORAL ENDARTERECTOMY WITH ANGIOPLASTY OF TIBIAL ARTERY;  Surgeon: Nada Libman, MD;  Location: [facility] OR;  Service: Vascular;  Laterality: Right;   ENDARTERECTOMY FEMORAL Left 02/11/2022   Procedure: LEFT FEMORAL ENDARTERECTOMY;  Surgeon: Victorino Sparrow, MD;  Location: [facility] OR;  Service: Vascular;  Laterality: Left;   ESOPHAGOGASTRODUODENOSCOPY     FEMORAL-POPLITEAL BYPASS GRAFT Left 02/11/2022   Procedure: LEFT BYPASS GRAFT FEMORAL-POPLITEAL ARTERY;  Surgeon: Victorino Sparrow, MD;  Location: [facility] OR;  Service: Vascular;  Laterality: Left;   GROIN DEBRIDEMENT Right  11/14/2021   Procedure: Wound exploration and closure.;  Surgeon: Chuck Hint, MD;  Location: [facility] OR;  Service: Vascular;  Laterality: Right;   IR AV DIALY SHUNT INTRO NEEDLE/INTRACATH INITIAL W/PTA/IMG RIGHT Right 10/07/2021   IR FLUORO GUIDE CV LINE RIGHT  10/06/2021   IR US GUIDE VASC ACCESS RIGHT  10/06/2021   IR US GUIDE VASC ACCESS RIGHT  10/08/2021   PATCH ANGIOPLASTY Left 02/11/2022   Procedure: PATCH ANGIOPLASTY WITH XENOSURE 1X6CM BIOLOGIC PATCH;  Surgeon: Victorino Sparrow, MD;  Location: [facility] OR;  Service: Vascular;  Laterality: Left;   Social History:  reports  that she quit smoking about 3 months ago. Her smoking use included cigarettes. She smoked an average of .25 packs per day. She has never been exposed to tobacco smoke. She has never used smokeless tobacco. She reports that she does not currently use alcohol. She reports that she does not currently use drugs after having used the following drugs: Marijuana and Cocaine.  Allergies  Allergen Reactions   Acyclovir And Related Itching    Family History  Problem Relation Age of Onset   High blood pressure Mother    Diabetes Mother    Thyroid disease Mother    Diabetes Father    High blood pressure Father    Cerebral palsy Daughter    Other Son        still born    Prior to Admission medications   Medication Sig Start Date End Date Taking? Authorizing Provider  acetaminophen (TYLENOL) 325 MG tablet Take 2 tablets (650 mg total) by mouth every 4 (four) hours as needed for headache or mild pain. 11/06/21   Russella Dar, NP  albuterol (VENTOLIN HFA) 108 (90 Base) MCG/ACT inhaler Inhale 2 puffs into the lungs every 4 (four) hours as needed for shortness of breath. For shortness of breath Patient taking differently: Inhale 2 puffs into the lungs every 4 (four) hours as needed for shortness of breath or wheezing. 12/11/19   Kallie Locks, FNP  amLODipine (NORVASC) 10 MG tablet Take 1 tablet (10 mg total) by mouth daily. 11/06/21   Russella Dar, NP  aspirin EC 81 MG EC tablet Take 1 tablet (81 mg total) by mouth daily. Swallow whole. 02/20/22 05/21/22  Darlin Drop, DO  atorvastatin (LIPITOR) 80 MG tablet Take 1 tablet (80 mg total) by mouth daily. 11/06/21   Russella Dar, NP  atropine 1 % ophthalmic solution Place 1 drop into both eyes 2 (two) times daily. (0900 & 2100)    [provider]  b complex-vitamin c-folic acid (NEPHRO-VITE) 0.8 MG TABS tablet Take 1 tablet by mouth at bedtime.    [provider]  cephALEXin (KEFLEX) 250 MG capsule Take 1 capsule (250 mg total)  by mouth 2 (two) times daily for 5 days. 02/19/22 02/24/22  Darlin Drop, DO  clopidogrel (PLAVIX) 75 MG tablet Take 1 tablet (75 mg total) by mouth daily at 6 (six) AM. 02/20/22 05/21/22  Darlin Drop, DO  dorzolamide-timolol (COSOPT) 22.3-6.8 MG/ML ophthalmic solution Place 1 drop into both eyes 2 (two) times daily. 12/08/20   [provider]  doxycycline (VIBRAMYCIN) 50 MG capsule Take 1 capsule (50 mg total) by mouth 2 (two) times daily for 5 days. 02/19/22 02/24/22  Darlin Drop, DO  furosemide (LASIX) 40 MG tablet Take 40 mg by mouth 2 (two) times daily. (0800 & 2000) 11/10/21   [provider]  hydrALAZINE (APRESOLINE) 100 MG tablet  Take 1 tablet (100 mg total) by mouth every 8 (eight) hours. Patient taking differently: Take 100 mg by mouth every 8 (eight) hours. (0800, 1400 & 2200) 10/09/21   Burnadette Pop, MD  insulin lispro (HUMALOG) 100 UNIT/ML injection Inject 2-10 Units into the skin 3 (three) times daily before meals. Inject as per sliding scale 150-200=2 units 201-250=4 units 251-300=6 units 301-350=8 units 351-400=10 units    [provider]  latanoprost (XALATAN) 0.005 % ophthalmic solution Place 1 drop into both eyes at bedtime. (2100) 12/16/20   [provider]  losartan (COZAAR) 100 MG tablet Take 1 tablet (100 mg total) by mouth daily. 11/06/21   Russella Dar, NP  metoCLOPramide (REGLAN) 10 MG tablet Take 10 mg by mouth in the morning and at bedtime. (0800 & 2000) 09/22/21   [provider]  metoprolol tartrate (LOPRESSOR) 50 MG tablet Take 1 tablet (50 mg total) by mouth 2 (two) times daily. 02/19/22 03/21/22  Darlin Drop, DO  oxyCODONE-acetaminophen (PERCOCET/ROXICET) 5-325 MG tablet Take 1 tablet by mouth every 6 (six) hours as needed for severe pain. 02/02/22   Baglia, Corrina, PA-C  pantoprazole (PROTONIX) 40 MG tablet Take 1 tablet (40 mg total) by mouth daily. 02/20/22 05/21/22  Darlin Drop, DO  senna-docusate (SENOKOT-S)  8.6-50 MG tablet Take 1 tablet by mouth at bedtime as needed for mild constipation. Patient taking differently: Take 1 tablet by mouth daily as needed for mild constipation. 11/06/21   Russella Dar, NP  sevelamer carbonate (RENVELA) 800 MG tablet Take 800-2,400 mg by mouth See admin instructions. Take 3 tablets (2400 mg) by mouth 3 time daily with meals (0800, 1300 & 1700) + Take 1 tablet (800 mg) by mouth as needed for ESRD with snacks.    [provider]    Physical Exam: Vitals:   02/21/22 2130 02/21/22 2145 02/21/22 2215 02/21/22 2230  BP: (!) 193/61 (!) 168/55 (!) 159/47 (!) 135/92  Pulse: 81 77 77 78  Resp: 13 10 12 11   Temp:      TempSrc:      SpO2: 100% 97% 98% 97%  Weight:      Height:       Constitutional: NAD Eyes: PERRL, lids and conjunctivae normal ENMT: Mucous membranes are moist. Posterior pharynx clear of any exudate or lesions.Normal dentition.  Neck: normal, supple, no masses, no thyromegaly Respiratory: clear to auscultation bilaterally, no wheezing, no crackles. Normal respiratory effort. No accessory muscle use.  Cardiovascular: Regular rate and rhythm, no murmurs / rubs / gallops. No extremity edema. 2+ pedal pulses. No carotid bruits.  Abdomen: no tenderness, no masses palpated. No hepatosplenomegaly. Bowel sounds positive.  Musculoskeletal: R BKA Skin: good DP in L foot but gangrene of toe persists, no cellulitis Neurologic: CN 2-12 grossly intact. Sensation intact, DTR normal. Strength 5/5 in all 4.  Psychiatric: Wakes up to voice, is oriented x4 for me, when asked if she will agree to dialysis she hesitates and indicates that she isnt sure.  Did ask if shed agree to admission for tonight so we could get the specialists to discuss it with her tomorrow and she did agree to that.   Data Reviewed:       Latest Ref Rng & Units 02/21/2022    8:43 PM 02/21/2022    8:33 PM 02/19/2022   10:50 AM  BMP  Glucose 70 - 99 mg/dL 161   096   045    BUN 6 -  20 mg/dL  73   79   44    Creatinine 0.44 - 1.00 mg/dL 16.10   96.04   5.40    Sodium 135 - 145 mmol/L 133   135   137    Potassium 3.5 - 5.1 mmol/L 5.4   5.6   4.0    Chloride 98 - 111 mmol/L 103   96   100    CO2 22 - 32 mmol/L  21   21    Calcium 8.9 - 10.3 mg/dL  98.1   19.1      CXR = no active disease  Assessment and Plan: * ESRD (end stage renal disease) (HCC) -  Dialyzes at [facility].  On Monday Wednesday Friday. Pt with multiple recent missed dialysis sessions d/t refusal. May or may not have capacity to refuse. Pal care consult Nephrology will come discuss with patient in AM May or may not want psych consult in AM for capacity evaluation (will defer to day team and pal care on this). No emergent dialysis needs for tonight For the moment, patient has agreed voluntarily to let me admit her to the hospital for tonight, so we can get this sorted out in the morning If she continues to refuse dialysis, then likely needs full comfort measures / hospice treatment at that point.  Delirium Mental status seems to Wax and wane at times; with that said, she is completely oriented x4 at the time I am evaluating her in the ED. May want to get psych consult in AM to help decide if she has decision making capacity.  Hyperkalemia Mild Got lokelma in ED Tele monitor Ultimately will get worse if pt continues to decline dialysis.  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 Gangrene persists. No gross systemic infection at this point.  HTN (hypertension) Cont home BP meds  Controlled type 2 diabetes mellitus with chronic kidney disease on chronic dialysis (HCC) Cont SSI Will make this Q4H since pt refusing to eat at the moment and PTA.      Advance Care Planning:   Code Status: DNR  Consults: Nephrology, Pal care consult put into Epic  Family Communication: No family in room  Severity of  Illness: The appropriate patient status for this patient is OBSERVATION. Observation status is judged to be reasonable and necessary in order to provide the required intensity of service to ensure the patient's safety. The patient's presenting symptoms, physical exam findings, and initial radiographic and laboratory data in the context of their medical condition is felt to place them at decreased risk for further clinical deterioration. Furthermore, it is anticipated that the patient will be medically stable for discharge from the hospital within 2 midnights of admission.   Author: Hillary Bow., DO 02/22/2022 12:34 AM  For on call review [email or web address].

## 2022-02-22 NOTE — Assessment & Plan Note (Signed)
Mild ?1. Got lokelma in ED ?2. Tele monitor ?3. Ultimately will get worse if pt continues to decline dialysis. ?

## 2022-02-22 NOTE — ED Notes (Signed)
Patient resting much better at this time, patient continues to pull all monitoring equipment off after being placed on. Patient informed of need for this equipment. Call bell in reach.  ?

## 2022-02-22 NOTE — Progress Notes (Signed)
?   02/22/22 1829  ?Assess: MEWS Score  ?BP (!) 204/53  ?Pulse Rate 90  ?Resp 16  ?SpO2 99 %  ?O2 Device Room Air  ?Assess: MEWS Score  ?MEWS Temp 0  ?MEWS Systolic 2  ?MEWS Pulse 0  ?MEWS RR 0  ?MEWS LOC 0  ?MEWS Score 2  ?MEWS Score Color Yellow  ?Notify: Provider  ?Provider Name/Title Bonner Puna MD  ?Date Provider Notified 02/22/22  ?Time Provider Notified [phone]  ?Notification Type Page  ?Notification Reason Other (Comment) ?(yellow mews BP)  ?Document  ?Patient Outcome Other (Comment) ?(continue to observe)  ?Progress note created (see row info) Yes  ? ? ?

## 2022-02-22 NOTE — Consult Note (Signed)
WOC Nurse Consult Note: ?Reason for Consult:Dressing orders requested by nursing for left foot.  Vascular Surgery (Dr. Unice Bailey) saw patient yesterday and indicated in their note that dry dressings are to be applied and to keep foot dry. I will transcribe those recommendations into Nursing Orders for the care of the left foot digits. ?Wound type: PAD, s/p bypass with toes worsening ?Pressure Injury POA:N/A ?Measurement:N/A ?Wound bed:red, moist ?Drainage (amount, consistency, odor) small amount purulence ?Periwound:dry ?Dressing procedure/placement/frequency: I will transcribe the recommendations of Dr. Virl Cagey into guidance for Nursing, i.e., once daily applications of dry dressings to interdigital spaces and securing with Kerlix roll gauze and paper tape. I will provide a pressure redistribution heel boot to prevent a pressure injury to the heel in this LE. ?A sacral foam prophylactic dressing is also ordered. ? ?[facility] nursing team will not follow, but will remain available to this patient, the nursing and medical teams.  Please re-consult if needed. ?Thanks, ?Maudie Flakes, MSN, RN, [place], [place], CWON-AP, Reader  ?Pager# [phone]  ? ? ? ?  ?

## 2022-02-22 NOTE — ED Notes (Signed)
Pt.'s daughter explained to this RN that the pt.'s orientation at this time is outside of baseline. She has noticed her mom getting increasingly confused since her last admission approx 1 month ago. Pt is refusing meals and tx but is not oriented to situation and her daughter feels the pt cannot make tx decisions. Pt is disoriented and needs several reminders about where she is at and why she is here. Social work has been contacted.  ?

## 2022-02-22 NOTE — ED Notes (Signed)
Patient has again removed all monitoring equipment and has removed IV from right hand. Patient verbalizes understanding of need for all these things and refuses to allow nurse to place another IV. Patient also pulls on cardiac monitor leads off as soon as nurse replaces them, repeatedly.  ?

## 2022-02-22 NOTE — Progress Notes (Signed)
NEW ADMISSION NOTE ?New Admission Note:  ? ?Arrival Method: stretcher ?Mental Orientation:  Alert to self ?Telemetry: patient removed ?Assessment: Completed ?Skin: intact right AKA left great tow wound ?IV: none ?Pain: asleep ?Tubes: none ?Safety Measures: Safety Fall Prevention Plan has been given, discussed and signed ?Admission: Completed ?[facility] [place] Orientation: Patient has been orientated to the room, unit and staff.  ?Family: contacted by phone ? ?Orders have been reviewed and implemented. Will continue to monitor the patient. Call light has been placed within reach and bed alarm has been activated.  ? ?Berneta Levins, RN   ?

## 2022-02-22 NOTE — Assessment & Plan Note (Addendum)
Mental status seems to Wax and wane at times; with that said, she is completely oriented x4 at the time I am evaluating her in the ED. ?May want to get psych consult in AM to help decide if she has decision making capacity. ?

## 2022-02-22 NOTE — Progress Notes (Signed)
PROGRESS NOTE ? ?Brief Narrative: ?Tammy Moses is a 56 y.o. female with a history of ESRD, T2DM, HTN, PVD s/p R AKA Feb 2023, and recent admission 4/13 - 4/28 for left great toe gangrene with left femoropopliteal bypass performed during that admission. She has refused amputation despite that being the recommendation of the surgical and medical team. She was failing to thrive but stable for discharge on 4/28. Unfortunately she continued to do poorly, brought back to ED by her Niece on 4/30 for dialysis. She was admitted this morning by Dr. Alcario Drought, initially consenting to dialysis but later refused.  ? ?Subjective: ?Pain in left foot is stable, improved with meds. Said she'd do dialysis when I talked to her this morning.  ? ?Objective: ?BP (!) 230/95 (BP Location: Right Arm)   Pulse 88   Temp (!) 97.5 ?F (36.4 ?C) (Oral)   Resp 16   Ht 5\' 1"  (1.549 m)   Wt 45.2 kg   SpO2 98%   BMI 18.83 kg/m?   ?Gen: Chronically ill-appearing  ?Pulm: Clear and nonlabored  ?CV: RRR, no murmur, no JVD, no edema ?GI: Soft, NT, ND, +BS  ?Neuro: Alert, blind, incompletely oriented, poor recall. Non focal.  ?Skin: Left foot is cool, dry with gangrenous changes to great toe, diminished but palpable pulses.  ? ?Assessment & Plan: ?Pt hypertensive, so restarting home medications ?Pain medication ordered for left foot ischemic pain, pt has refused amputation.  ?Dialysis refused later in the day by patient, nephrology arranging for Niece to accompany patient to HD tomorrow. No emergent needs.  ? ?Bigger issue is that palliative care consultation is required for pursue goals of care discussions going forward. With the patient's persistent and seemingly insistent desire, even when considering delirium, it is most likely that respecting her wishes will result in transition to hospice care.  ? ?Patrecia Pour, MD ?Pager on amion ?02/22/2022, 4:23 PM   ?

## 2022-02-22 NOTE — ED Notes (Signed)
Pt refusing to wear monitor leads  ?

## 2022-02-22 NOTE — Consult Note (Signed)
Renal Service ?Consult Note ?[facility] ? ?Melvia Heaps ?02/22/2022 ?Sol Blazing, MD ?Requesting Physician: Dr. Bonner Puna ? ?Reason for Consult: ESRD pt w/ missed HD ?HPI: The patient is a 56 y.o. year-old w/ hx of ESRD on HD, anemia, depression, DM2, R AKA, glaucoma, HL, HTN was dc'd from hospital on 4/26 Friday 3 days ago. Per family the patient has been refusing treatment and dialysis and they were concerned about her L leg wounds (had recent LLE bypass surgery). In ED VVS and 95% on RA. Asked to see for ESRD.   ? ?Pt seen in room.  Pt asking if she "really needs to go to dialysis". We had a long discussions about the life-ending reality of not going to dialysis on a regular basis if you have ESRD. After discussing at length she said she wants to do dialysis. She denies any N/V or SOB/ cough.  ? ?Pt was sent to SNF/ rehab at [facility] in Dec 2022. Poor social support. Prior HD was [employer] [facility]. Admit in Jan 2023 for R groin wound infection. Admit in feb/ march and had R AKA.  Admit in 01/2022 for HTN'sive crisis then had L fem-pop BPG for ischemic foot. Then was refusing therapies and missed HD x 3. She was DNR. Delirium resolved. DC'd to SNF I believe and was supposed to switch to [place] [facility] while at the SNF.  ? ?ROS - denies CP, no joint pain, no HA, no blurry vision, no rash, no diarrhea, no nausea/ vomiting ? ? ?Past Medical History  ?Past Medical History:  ?Diagnosis Date  ? Anemia   ? Anxiety   ? Blind right eye   ? Chronic kidney disease, stage V ([place])   ? Dialysis M-W-F  ? Confusion   ? at times  ? Constipation, chronic   ? Dental caries   ? Depression   ? Diabetes mellitus   ? Diabetic neuropathy ([place])   ? Diabetic retinopathy   ? GERD (gastroesophageal reflux disease)   ? Glaucoma   ? H. pylori infection   ? Hepatitis C carrier ([place])   ? High risk sexual behavior   ? Hyperlipidemia   ? Hypertension   ? Hypoglycemia 07/12/2017  ? Insomnia   ? Microalbuminuria   ? Nonspecific elevation of  levels of transaminase or lactic acid dehydrogenase (LDH)   ? Tobacco dependence   ? Vitamin D deficiency   ? ?Past Surgical History  ?Past Surgical History:  ?Procedure Laterality Date  ? ABDOMINAL AORTOGRAM W/LOWER EXTREMITY N/A 10/27/2021  ? Procedure: ABDOMINAL AORTOGRAM W/LOWER EXTREMITY;  Surgeon: Serafina Mitchell, MD;  Location: [place] [facility];  Service: Cardiovascular;  Laterality: N/A;  ? ABDOMINAL AORTOGRAM W/LOWER EXTREMITY N/A 02/10/2022  ? Procedure: ABDOMINAL AORTOGRAM W/LOWER EXTREMITY;  Surgeon: Broadus John, MD;  Location: [place] [facility];  Service: Cardiovascular;  Laterality: N/A;  Left  ? ABDOMINAL HYSTERECTOMY  04/30/2009  ? PARTIAL  ? AMPUTATION Right 10/30/2021  ? Procedure: RIGHT GREAT TOE AMPUTATION;  Surgeon: Serafina Mitchell, MD;  Location: [facility] OR;  Service: Vascular;  Laterality: Right;  ? AMPUTATION Right 12/21/2021  ? Procedure: RIGHT ABOVE KNEE AMPUTATION;  Surgeon: [name], MD;  Location: [place];  Service: Vascular;  Laterality: Right;  ? COLONOSCOPY    ? DIALYSIS FISTULA CREATION Left 06/2017  ? ENDARTERECTOMY FEMORAL Right 10/30/2021  ? Procedure: RIGHT FEMORAL ENDARTERECTOMY WITH ANGIOPLASTY OF TIBIAL ARTERY;  Surgeon: Serafina Mitchell, MD;  Location: [facility]  OR;  Service: Vascular;  Laterality: Right;  ? ENDARTERECTOMY FEMORAL Left 02/11/2022  ? Procedure: LEFT FEMORAL ENDARTERECTOMY;  Surgeon: Broadus John, MD;  Location: [place];  Service: Vascular;  Laterality: Left;  ? ESOPHAGOGASTRODUODENOSCOPY    ? FEMORAL-POPLITEAL BYPASS GRAFT Left 02/11/2022  ? Procedure: LEFT BYPASS GRAFT FEMORAL-POPLITEAL ARTERY;  Surgeon: Broadus John, MD;  Location: [place];  Service: Vascular;  Laterality: Left;  ? GROIN DEBRIDEMENT Right 11/14/2021  ? Procedure: Wound exploration and closure.;  Surgeon: Angelia Mould, MD;  Location: [facility] OR;  Service: Vascular;  Laterality: Right;  ? IR AV DIALY SHUNT INTRO NEEDLE/INTRACATH INITIAL W/PTA/IMG RIGHT Right 10/07/2021  ? IR FLUORO GUIDE  CV LINE RIGHT  10/06/2021  ? IR US GUIDE VASC ACCESS RIGHT  10/06/2021  ? IR US GUIDE VASC ACCESS RIGHT  10/08/2021  ? PATCH ANGIOPLASTY Left 02/11/2022  ? Procedure: PATCH ANGIOPLASTY WITH XENOSURE 1X6CM BIOLOGIC PATCH;  Surgeon: Broadus John, MD;  Location: [place];  Service: Vascular;  Laterality: Left;  ? ?Family History  ?Family History  ?Problem Relation Age of Onset  ? High blood pressure Mother   ? Diabetes Mother   ? Thyroid disease Mother   ? Diabetes Father   ? High blood pressure Father   ? Cerebral palsy Daughter   ? Other Son   ?     still born  ? ?Social History  reports that she quit smoking about 3 months ago. Her smoking use included cigarettes. She smoked an average of .25 packs per day. She has never been exposed to tobacco smoke. She has never used smokeless tobacco. She reports that she does not currently use alcohol. She reports that she does not currently use drugs after having used the following drugs: Marijuana and Cocaine. ?Allergies  ?Allergies  ?Allergen Reactions  ? Acyclovir And Related Itching  ? ?Home medications ?Prior to Admission medications   ?Medication Sig Start Date End Date Taking? Authorizing Provider  ?acetaminophen (TYLENOL) 325 MG tablet Take 2 tablets (650 mg total) by mouth every 4 (four) hours as needed for headache or mild pain. 11/06/21  Yes Samella Parr, NP  ?albuterol (VENTOLIN HFA) 108 (90 Base) MCG/ACT inhaler Inhale 2 puffs into the lungs every 4 (four) hours as needed for shortness of breath. For shortness of breath ?Patient taking differently: Inhale 2 puffs into the lungs every 4 (four) hours as needed for shortness of breath or wheezing. 12/11/19  Yes [name], FNP  ?amLODipine (NORVASC) 10 MG tablet Take 1 tablet (10 mg total) by mouth daily. 11/06/21  Yes Samella Parr, NP  ?aspirin EC 81 MG EC tablet Take 1 tablet (81 mg total) by mouth daily. Swallow whole. 02/20/22 05/21/22 Yes Kayleen Memos, DO  ?atorvastatin (LIPITOR) 80 MG tablet Take 1  tablet (80 mg total) by mouth daily. 11/06/21  Yes Samella Parr, NP  ?atropine 1 % ophthalmic solution Place 1 drop into both eyes 2 (two) times daily. (0900 & 2100)   Yes [provider]  ?b complex-vitamin c-folic acid (NEPHRO-VITE) 0.8 MG TABS tablet Take 1 tablet by mouth at bedtime.   Yes [provider]  ?cephALEXin (KEFLEX) 250 MG capsule Take 1 capsule (250 mg total) by mouth 2 (two) times daily for 5 days. ?Patient taking differently: Take 250 mg by mouth See admin instructions. Bid x 5 days 02/19/22 02/24/22 Yes Irene Pap N, DO  ?clopidogrel (PLAVIX) 75 MG tablet Take 1 tablet (75 mg total)  by mouth daily at 6 (six) AM. 02/20/22 05/21/22 Yes Kayleen Memos, DO  ?dorzolamide-timolol (COSOPT) 22.3-6.8 MG/ML ophthalmic solution Place 1 drop into both eyes 2 (two) times daily. 12/08/20  Yes [provider]  ?doxycycline (VIBRAMYCIN) 50 MG capsule Take 1 capsule (50 mg total) by mouth 2 (two) times daily for 5 days. ?Patient taking differently: Take 50 mg by mouth See admin instructions. Bid x 5 days 02/19/22 02/24/22 Yes Kayleen Memos, DO  ?furosemide (LASIX) 40 MG tablet Take 40 mg by mouth 2 (two) times daily. (0800 & 2000) 11/10/21  Yes [provider]  ?Glucerna (GLUCERNA) LIQD Take 237 mLs by mouth 3 (three) times daily between meals.   Yes [provider]  ?hydrALAZINE (APRESOLINE) 100 MG tablet Take 1 tablet (100 mg total) by mouth every 8 (eight) hours. ?Patient taking differently: Take 100 mg by mouth every 8 (eight) hours. (0800, 1400 & 2200) 10/09/21  Yes Adhikari, Tamsen Meek, MD  ?insulin lispro (HUMALOG) 100 UNIT/ML injection Inject 2-10 Units into the skin 3 (three) times daily before meals. Inject as per sliding scale ?150-200=2 units ?201-250=4 units ?251-300=6 units ?301-350=8 units ?351-400=10 units   Yes [provider]  ?latanoprost (XALATAN) 0.005 % ophthalmic solution Place 1 drop into both eyes at bedtime. (2100) 12/16/20  Yes [provider]  ?losartan (COZAAR) 100 MG tablet Take 1 tablet (100 mg total) by mouth daily. 11/06/21  Yes Samella Parr, NP  ?metoCLOPramide (REGLAN) 10 MG tablet Take 10 mg by mouth in the morning an

## 2022-02-22 NOTE — Plan of Care (Signed)
°  Problem: Coping: °Goal: Level of anxiety will decrease °Outcome: Progressing °  °

## 2022-02-22 NOTE — Assessment & Plan Note (Addendum)
See Vasc surgery quick note. ?Gangrene persists. ?No gross systemic infection at this point. ?

## 2022-02-22 NOTE — ED Notes (Signed)
Pt found on the floor, appeared to have gotten herself out of bed. Pt has been reoriented several times and placed close to the nurses station. MD notified. Vitals taken, skin assessed. Incision on left interior thighs had slightly opened, this RN cleaned the area and wrapped in gauze. No other injuries noted.  ?

## 2022-02-23 DIAGNOSIS — R451 Restlessness and agitation: Secondary | ICD-10-CM

## 2022-02-23 DIAGNOSIS — Z515 Encounter for palliative care: Secondary | ICD-10-CM

## 2022-02-23 DIAGNOSIS — N186 End stage renal disease: Secondary | ICD-10-CM | POA: Diagnosis not present

## 2022-02-23 DIAGNOSIS — Z789 Other specified health status: Secondary | ICD-10-CM

## 2022-02-23 DIAGNOSIS — E875 Hyperkalemia: Secondary | ICD-10-CM | POA: Diagnosis not present

## 2022-02-23 DIAGNOSIS — Z66 Do not resuscitate: Secondary | ICD-10-CM

## 2022-02-23 DIAGNOSIS — R410 Disorientation, unspecified: Secondary | ICD-10-CM | POA: Diagnosis not present

## 2022-02-23 DIAGNOSIS — E1122 Type 2 diabetes mellitus with diabetic chronic kidney disease: Secondary | ICD-10-CM | POA: Diagnosis not present

## 2022-02-23 DIAGNOSIS — Z7189 Other specified counseling: Secondary | ICD-10-CM

## 2022-02-23 DIAGNOSIS — I70462 Atherosclerosis of autologous vein bypass graft(s) of the extremities with gangrene, left leg: Secondary | ICD-10-CM | POA: Diagnosis not present

## 2022-02-23 LAB — CBC
HCT: 30.6 % — ABNORMAL LOW (ref 36.0–46.0)
Hemoglobin: 9.4 g/dL — ABNORMAL LOW (ref 12.0–15.0)
MCH: 26.6 pg (ref 26.0–34.0)
MCHC: 30.7 g/dL (ref 30.0–36.0)
MCV: 86.7 fL (ref 80.0–100.0)
Platelets: 498 10*3/uL — ABNORMAL HIGH (ref 150–400)
RBC: 3.53 MIL/uL — ABNORMAL LOW (ref 3.87–5.11)
RDW: 20.4 % — ABNORMAL HIGH (ref 11.5–15.5)
WBC: 13.7 10*3/uL — ABNORMAL HIGH (ref 4.0–10.5)
nRBC: 0 % (ref 0.0–0.2)

## 2022-02-23 LAB — RENAL FUNCTION PANEL
Albumin: 2.4 g/dL — ABNORMAL LOW (ref 3.5–5.0)
Anion gap: 19 — ABNORMAL HIGH (ref 5–15)
BUN: 100 mg/dL — ABNORMAL HIGH (ref 6–20)
CO2: 21 mmol/L — ABNORMAL LOW (ref 22–32)
Calcium: 10.5 mg/dL — ABNORMAL HIGH (ref 8.9–10.3)
Chloride: 96 mmol/L — ABNORMAL LOW (ref 98–111)
Creatinine, Ser: 12.66 mg/dL — ABNORMAL HIGH (ref 0.44–1.00)
GFR, Estimated: 3 mL/min — ABNORMAL LOW (ref >60)
Glucose, Bld: 93 mg/dL (ref 70–99)
Phosphorus: 6.3 mg/dL — ABNORMAL HIGH (ref 2.5–4.6)
Potassium: 5.4 mmol/L — ABNORMAL HIGH (ref 3.5–5.1)
Sodium: 136 mmol/L (ref 135–145)

## 2022-02-23 LAB — GLUCOSE, CAPILLARY
Glucose-Capillary: 126 mg/dL — ABNORMAL HIGH (ref 70–99)
Glucose-Capillary: 88 mg/dL (ref 70–99)
Glucose-Capillary: 96 mg/dL (ref 70–99)

## 2022-02-23 MED ORDER — GLYCOPYRROLATE 0.2 MG/ML IJ SOLN: 0.2000 mg | INTRAMUSCULAR | Status: DC | PRN

## 2022-02-23 MED ORDER — LORAZEPAM 2 MG/ML PO CONC: 1.0000 mg | ORAL | Status: DC | PRN

## 2022-02-23 MED ORDER — LORAZEPAM 1 MG PO TABS: 1.0000 mg | ORAL_TABLET | ORAL | Status: DC | PRN

## 2022-02-23 MED ORDER — LORAZEPAM 2 MG/ML IJ SOLN: 1.0000 mg | INTRAMUSCULAR | Status: DC | PRN

## 2022-02-23 MED ORDER — BIOTENE DRY MOUTH MT LIQD: 15.0000 mL | Freq: Two times a day (BID) | OROMUCOSAL | Status: DC

## 2022-02-23 MED ORDER — GLYCOPYRROLATE 1 MG PO TABS
1.0000 mg | ORAL_TABLET | ORAL | Status: DC | PRN
  Filled 2022-02-23: qty 1

## 2022-02-23 MED ORDER — DIPHENHYDRAMINE HCL 50 MG/ML IJ SOLN: 12.5000 mg | INTRAMUSCULAR | Status: DC | PRN

## 2022-02-23 MED ORDER — HALOPERIDOL LACTATE 5 MG/ML IJ SOLN: 2.0000 mg | Freq: Four times a day (QID) | INTRAMUSCULAR | Status: DC | PRN

## 2022-02-23 MED ORDER — POLYVINYL ALCOHOL 1.4 % OP SOLN
1.0000 [drp] | Freq: Four times a day (QID) | OPHTHALMIC | Status: DC | PRN
  Filled 2022-02-23: qty 15

## 2022-02-23 MED ORDER — LORAZEPAM 2 MG/ML PO CONC: 1.0000 mg | Freq: Four times a day (QID) | ORAL | Status: DC

## 2022-02-23 MED ORDER — LORAZEPAM 1 MG PO TABS
1.0000 mg | ORAL_TABLET | Freq: Four times a day (QID) | ORAL | Status: DC
Start: 2022-02-23 — End: 2022-02-23

## 2022-02-23 MED ORDER — LORAZEPAM 1 MG PO TABS
1.0000 mg | ORAL_TABLET | Freq: Four times a day (QID) | ORAL | Status: DC
Start: 2022-02-23 — End: 2022-02-24
  Administered 2022-02-23: 1 mg via ORAL
  Filled 2022-02-23: qty 1

## 2022-02-23 MED ORDER — HALOPERIDOL LACTATE 2 MG/ML PO CONC
2.0000 mg | Freq: Four times a day (QID) | ORAL | Status: DC | PRN
  Filled 2022-02-23: qty 5

## 2022-02-23 MED ORDER — HALOPERIDOL 1 MG PO TABS
2.0000 mg | ORAL_TABLET | Freq: Four times a day (QID) | ORAL | Status: DC | PRN
  Filled 2022-02-23: qty 2

## 2022-02-23 MED ORDER — OXYCODONE HCL 5 MG PO TABS: 5.0000 mg | ORAL_TABLET | ORAL | Status: DC | PRN

## 2022-02-23 NOTE — Consult Note (Signed)
?Consultation Note ?Date: 02/23/2022  ? ?Patient Name: Tammy Moses  ?DOB: 1966-08-01  MRN: [id number]  Age / Sex: 56 y.o., female  ?PCP: [name], FNP ?Referring Physician: Patrecia Pour, MD ? ?Reason for Consultation: Establishing goals of care, "patient refusing care but family wants G tube and dialysis" ? ?HPI/Patient Profile: 56 y.o. female  with past medical history of ESRD on HD MWF, DM2, HTN, R AKA in Feb presented to ED on 02/21/2022 from home with family concerns of patient's poor oral intake and refusing dialysis treatments.  Patient was admitted on 02/21/2022 with End-stage renal disease on dialysis, delirium, hyperkalemia, critical limb ischemia of left lower extremity with autologous bypass graft with gangrene.  He was noted family were originally hoping to pursue feeding tube; however, after further discussion family do not wish to pursue. ? ?Of note, patient was recently admitted from 4/13 -02/19/2022 for critical limb ischemia where she was refusing amputation and dialysis.  PMT was involved in goals of care discussions during this hospitalization. ? ?Clinical Assessment and Goals of Care: ?I have reviewed medical records including EPIC notes, labs, and imaging. Received report from primary RN -no acute concerns.  Reports patient has remained confused, agitated, impulsive, with minimal oral intake. ? ?Went to visit patient at bedside -niece/Joshana and mother/Tammy Moses present. Patient was lying in bed asleep -she briefly opens her eyes to voice/gentle touch but quickly falls back asleep.  She is unable to participate in meaningful conversation. No signs or non-verbal gestures of pain or discomfort noted. No respiratory distress, increased work of breathing, or secretions noted.  Restraints are in use and safety sitter at bedside. ? ?Met with niece and mother to discuss diagnosis, prognosis, GOC, EOL wishes,  disposition, and options. ? ?I re-introduced Palliative Medicine as specialized medical care for people living with serious illness. It focuses on providing relief from the symptoms and stress of a serious illness. The goal is to improve quality of life for both the patient and the family. ? ?We discussed interval history since patient's last admission.  Family report that patient has continued to decline -minimal oral intake, refusing dialysis.  Patient's niece has been her primary caregiver and primarily speaks to this decline. ? ?We discussed patient's current illness and what it means in the larger context of patient's on-going co-morbidities.  Reviewed information given to them from Dr. Bonner Puna.  Family have a clear understanding of patient's current acute medical situation.  Therapeutic listening provided as family reflect on previous conversations with patient where she indicated that "she is ready" (to die) and she is "tired of fighting."  Family "know the time is near" and "feels she is suffering."  Natural disease trajectory and expectations at EOL were discussed. I attempted to elicit values and goals of care important to the patient. The difference between aggressive medical intervention and comfort care was considered in light of the patient's goals of care.  At this time they wish to support the patient's wishes and do not wish to pursue feeding tube or any further dialysis treatments.  ? ?We talked about transition to comfort measures in house and what that would entail inclusive of medications to control pain, dyspnea, agitation, nausea, and itching. We discussed stopping all unnecessary measures such as blood draws, needle sticks, oxygen, antibiotics, CBGs/insulin, cardiac monitoring, IVF, and frequent vital signs.  Family are agreeable for transition to full comfort care in house today. ? ?Provided education and counseling at length on the philosophy and benefits  of hospice care. Discussed that it  offers a holistic approach to care in the setting of end-stage illness, and is about supporting the patient where they are allowing nature to take it's course. Discussed the hospice team includes RNs, physicians, social workers, and chaplains. They can provide personal care, support for the family, and help keep patient out of the hospital as well as assist with DME needs for home hospice. Education provided on the difference between home vs residential hospice. Provided reassurance that residential hospice referral could be cancelled (would anticipate hospital death) at any time if patient's condition changed and it was felt they were too unstable for transfer.  Family are agreeable for patient transfer to residential hospice facility -requesting [facility]. ? ?Prognostication reviewed.  Unsure when patient's last dialysis treatment was; per chart review is likely over 1 week ago. ? ?Visit also consisted of discussions dealing with the complex and emotionally intense issues of symptom management and palliative care in the setting of serious and life-threatening illness.  ? ?Discussed with family the importance of continued conversation with each other and the medical providers regarding overall plan of care and treatment options, ensuring decisions are within the context of the patient?s values and GOCs.   ? ?Questions and concerns were addressed. The patient/family was encouraged to call with questions and/or concerns. PMT card was provided. ? ?Updated sitter at bedside goal is for full comfort  -requested she remove all unnecessary lines/tubes. ? ?Discussed with primary RN symptom management plan.  Goal is to have patient's restlessness/agitation/impulsiveness controlled enough where restraints can be removed. ? ?Primary Decision Maker: ?NEXT OF KIN -patient's mother/Tammy Moses and Joshana/niece ?  ? ?SUMMARY OF RECOMMENDATIONS   ?Initiated full comfort measures ?Continue DNR/DNI as previously documented ?Family  requesting transfer to [place] consult placed; TOC and hospice liaison notifed ?Added orders for EOL symptom management and to reflect full comfort measures, as well as discontinued orders that were not focused on comfort ?Continue palliative wound care ?Okay to leave out IV ?Unrestricted visitation orders were placed per current [place] EOL visitation policy  ?Nursing to provide frequent assessments and administer PRN medications as clinically necessary to ensure EOL comfort ?PMT will continue to follow and support holistically ? ?Symptom Management ?Oxycodone PRN pain/increased work of breathing/RR >25/distress ?Scheduled ativan q6h with goal to remove restrains and PRN doses for breakthrough symptoms ?Continue blood pressure medications and lasix while tolerating POs; will consider stopping lasix over the next several days ?Tylenol PRN pain/fever ?Biotin twice daily ?Benadryl PRN itching ?Robinul PRN secretions ?Haldol PRN agitation/delirium ?Zofran PRN nausea/vomiting ?Liquifilm Tears PRN dry eye ? ? ?Code Status/Advance Care Planning: ?DNR ? ?Palliative Prophylaxis:  ?Aspiration, Bowel Regimen, Delirium Protocol, Frequent Pain Assessment, Oral Care, and Turn Reposition ? ?Additional Recommendations (Limitations, Scope, Preferences): ?Full Comfort Care ? ?Psycho-social/Spiritual:  ?Desire for further Chaplaincy support:no ?Created space and opportunity for patient and family to express thoughts and feelings regarding patient's current medical situation.  ?Emotional support and therapeutic listening provided. ? ?Prognosis:  ?< 2 weeks ? ?Discharge Planning: Hospice facility  ? ?  ? ?Primary Diagnoses: ?Present on Admission: ? ESRD (end stage renal disease) ([place]) -  Dialyzes at [facility].  On Monday Wednesday Friday. ? HTN (hypertension) ? Hyperkalemia ? Critical limb ischemia of left lower extremity with autologous bypass graft with gangrene ([place]) ? Delirium ? ? ?I have reviewed the  medical record, interviewed the patient and family, and examined the patient. The following aspects are  pertinent. ? ?Past Medical History:  ?Diagnosis Date  ? Anemia   ? Anxiety   ? Blind right eye   ? Confusion

## 2022-02-23 NOTE — Progress Notes (Signed)
DISCHARGE NOTE SNF ?Melvia Heaps to be discharged [facility] per MD order. Patient verbalized understanding. ? ?Skin clean, dry and intact without evidence of skin break down, no evidence of skin tears noted. IV catheter discontinued intact. Site without signs and symptoms of complications. Dressing and pressure applied. Pt denies pain at the site currently. No complaints noted. ? ?Patient free of lines, drains, and wounds.  ? ?Discharge packet assembled. An After Visit Summary (AVS) was printed and given to the EMS personnel. Patient escorted via stretcher and discharged to [facility] via ambulance. Report called to accepting facility; all questions and concerns addressed.  ? ?Berneta Levins, RN  ?

## 2022-02-23 NOTE — Progress Notes (Signed)
[facility] ([facility]) Hospital Liaison note.     This patient is approved to transfer to [facility] today.    [facility] will notify TOC when registration paperwork has been completed to arrange transport.    RN please call report to [phone].   Thank you,     Mary Anne Robertson, RN, CCM       [facility] Hospital Liaison  [phone] 

## 2022-02-23 NOTE — Consult Note (Signed)
? ?  [facility] CM Inpatient Consult ? ? ?02/23/2022 ? ?Tammy Moses ?14-Feb-1966 ?[id number] ? ?[facility] [ACO] Patient: Environmental consultant Medicare ? ?Patient has had multiple [facility] referrals and recently referred with pending status. ? ?Chart reviewed with MD progress notes,  Palliative consult, and Inpatient TOC LCSW notes and reveals the patient is currently transitioning to Hospice/Palliative Care services. ? ?Given this choice patient will have full case management services through Hospice and needs will be met at the hospice level of care. ? ?Plan:  Will sign off at appropriate transition currently notes for a residential hospice facility.. ? ?Natividad Brood, RN BSN CCM ?[facility] Liaison ? [phone] business mobile phone ?Toll free office [phone]  ?Fax number: [phone] ?[email or web address] ?  ? ? ?

## 2022-02-23 NOTE — Care Management Obs Status (Signed)
MEDICARE OBSERVATION STATUS NOTIFICATION ? ? ?Patient Details  ?Name: Tammy Moses ?MRN: [id number] ?Date of Birth: 05-May-1966 ? ? ?Medicare Observation Status Notification Given:  Yes ? ? ? ?Tom-Johnson, Renea Ee, RN ?02/23/2022, 8:19 AM ?

## 2022-02-23 NOTE — TOC Transition Note (Signed)
Transition of Care (TOC) - CM/SW Discharge Note ? ? ?Patient Details  ?Name: Tammy Moses ?MRN: [id number] ?Date of Birth: 07/23/1966 ? ?Transition of Care (TOC) CM/SW Contact:  ?Tom-Johnson, Renea Ee, RN ?Phone Number: ?02/23/2022, 4:53 PM ? ? ?Clinical Narrative:    ? ?Patient is scheduled for discharge today. Admitted for ESRD, AMS. Patient will be transferring to [facility] with hospice care. [facility] scheduled for transportation. No further TOC needs noted. ? ?Final next level of care: [place] ?Barriers to Discharge: Barriers Resolved ? ? ?Patient Goals and CMS Choice ?Patient states their goals for this hospitalization and ongoing recovery are:: To go to [facility] ?CMS Medicare.gov Compare Post Acute Care list provided to:: Patient ?Choice offered to / list presented to : Patient, Adult Children, Sibling ? ?Discharge Placement ?  ?           ?  ?Patient to be transferred to facility by: [facility] ?  ?  ? ?Discharge Plan and Services ?  ?  ?           ?DME Arranged: N/A ?DME Agency: NA ?  ?  ?  ?HH Arranged: NA ?[place] Agency: Other - See comment ([facility]) ?  ?  ?  ? ?Social Determinants of Health (SDOH) Interventions ?  ? ? ?Readmission Risk Interventions ? ?  02/19/2022  ?  2:03 PM 10/09/2021  ?  4:18 PM  ?Readmission Risk Prevention Plan  ?Transportation Screening Complete Complete  ?Medication Review Press photographer) Complete   ?PCP or Specialist appointment within 3-5 days of discharge Complete   ?[place] or Home Care Consult Complete Complete  ?SW Recovery Care/Counseling Consult Complete Complete  ?Palliative Care Screening Not Applicable Not Applicable  ?Skilled Nursing Facility Not Applicable Complete  ? ? ? ? ? ?

## 2022-02-23 NOTE — Discharge Summary (Signed)
?Physician Discharge Summary ?  ?Patient: Tammy Moses MRN: [id number] DOB: [birth year]  ?Admit date:     02/21/2022  ?Discharge date: 02/23/22  ?Discharge Physician: Patrecia Pour  ? ?PCP: [name], FNP  ? ?Recommendations at discharge:  ?Comfort measures ? ?Discharge Diagnoses: ?Principal Problem: ?  ESRD (end stage renal disease) ([place]) -  Dialyzes at [facility].  On Monday Wednesday Friday. ?Active Problems: ?  Delirium ?  Critical limb ischemia of left lower extremity with autologous bypass graft with gangrene ([place]) ?  Hyperkalemia ?  Controlled type 2 diabetes mellitus with chronic kidney disease on chronic dialysis ([place]) ?  HTN (hypertension) ? ? ?Hospital Course: ?Tammy Moses is a 56 y.o. female with a history of ESRD, T2DM, HTN, PVD s/p R AKA Feb 2023, and recent admission 4/13 - 4/28 for left great toe gangrene with left femoropopliteal bypass performed during that admission. She has refused amputation despite that being the recommendation of the surgical and medical team. She was failing to thrive but stable for discharge on 4/28. Unfortunately she continued to do poorly, brought back to ED by her Niece on 4/30 for dialysis. She was admitted but has struggled with agitated delirium, continued refusal of care including dialysis, and showing no interest in taking po.  ?  ?I spoke with her Niece who has been her caregiver since recent stints in facilities [facility] [place] then [place]), confirms a chronic nature to debility, cognitive impairment, and malnutrition that has only gotten worse. She agrees that Tammy Moses's prognosis is poor whether she receives dialysis or not. She will have the patient's mother come to bedside for full goals of care discussions, though seems to think that the result of honoring the patient's wishes would entail transition to full comfort measures and transfer to residential hospice. ? 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  ?Overall, this patient's clinical status is not going to improve regardless of interventions performed at this time. She has insistently declined dialysis (even when she was clear-minded her Niece found out dialysis stopped calling transport due to no-shows last year), and continues to do so now. She has recently declined the only definitive therapy available to her for gangrene (amputation), and is showing evidence of transitioning toward end of life with not taking fluids/nutrition. I have shared my concern that we are now putting the patient through hospitalization and all its risks and invasive interventions despite medical futility. I suggested stopping medications and interventions that are not purely aimed at comfort, peace, dignity and transitioning to residential hospice. Niece agrees and suspects her Aunt would be amenable to that were she to be of sound mind at this time. [name] will come to the hospital to discuss in person. I've asked palliative care to also engage in discussions and notified nephrology that dialysis will be no longer required.  ? ?- Continue gtt's, prn albuterol, PPI, and continue prn anxiety/pain medications per hospice MD. ? ?Consultants: Nephrology, palliative care ?Procedures performed: None  ?Disposition:  Residential hospice, [employer] ?Diet recommendation: As tolerated ?DISCHARGE MEDICATION: ?Allergies as of 02/23/2022   ? ?   Reactions  ? Acyclovir  And Related Itching  ? ?  ? ?  ?Medication List  ?  ? ?STOP taking these medications    ? ?amLODipine 10 MG tablet ?Commonly known as: NORVASC ?  ?aspirin 81 MG EC tablet ?  ?atorvastatin 80 MG tablet ?Commonly known as: LIPITOR ?  ?b complex-vitamin c-folic acid 0.8 MG Tabs tablet ?  ?cephALEXin 250 MG capsule ?Commonly known as: KEFLEX ?  ?clopidogrel 75 MG tablet ?Commonly known as: PLAVIX ?  ?doxycycline 50 MG capsule ?Commonly known as: VIBRAMYCIN ?  ?furosemide 40 MG tablet ?Commonly known as: LASIX ?  ?Glucerna Liqd ?  ?HumaLOG 100 UNIT/ML injection ?Generic drug: insulin lispro ?  ?hydrALAZINE 100 MG tablet ?Commonly known as: APRESOLINE ?  ?losartan 100 MG tablet ?Commonly known as: COZAAR ?  ?metoCLOPramide 10 MG tablet ?Commonly known as: REGLAN ?  ?metoprolol tartrate 50 MG tablet ?Commonly known as: LOPRESSOR ?  ?oxyCODONE-acetaminophen 5-325 MG tablet ?Commonly known as: PERCOCET/ROXICET ?  ?sevelamer carbonate 800 MG tablet ?Commonly known as: RENVELA ?  ? ?  ? ?TAKE these medications   ? ?acetaminophen 325 MG tablet ?Commonly known as: TYLENOL ?Take 2 tablets (650 mg total) by mouth every 4 (four) hours as needed for headache or mild pain. ?  ?albuterol 108 (90 Base) MCG/ACT inhaler ?Commonly known as: VENTOLIN HFA ?Inhale 2 puffs into the lungs every 4 (four) hours as needed for shortness of breath. For shortness of breath ?What changed:  ?reasons to take this ?additional instructions ?  ?atropine 1 % ophthalmic solution ?Place 1 drop into both eyes 2 (two) times daily. (0900 & 2100) ?  ?dorzolamide-timolol 22.3-6.8 MG/ML ophthalmic solution ?Commonly known as: COSOPT ?Place 1 drop into both eyes 2 (two) times daily. ?  ?latanoprost 0.005 % ophthalmic solution ?Commonly known as: XALATAN ?Place 1 drop into both eyes at bedtime. (2100) ?  ?pantoprazole 40 MG tablet ?Commonly known as: PROTONIX ?Take 1 tablet (40 mg total) by mouth daily. ?  ?senna-docusate 8.6-50 MG tablet ?Commonly known as: Senokot-S ?Take 1 tablet by mouth at bedtime as needed for mild constipation. ?What changed:  when to take this ?  ? ?  ? ? ?Discharge Exam: ?BP (!) 177/57 (BP Location: Right Arm)   Pulse 76   Temp 98.2 ?F (36.8 ?C) (Oral)   Resp 16   Ht 5\' 1"  (1.549 m)   Wt 45.2 kg   SpO2 100%   BMI 18.83 kg/m?   ?Gen: Frail female in no distress ?Pulm: Nonlabored breathing room air ?CV: Regular rate and rhythm. No murmur, rub, or gallop. No JVD, no pitting dependent edema. ?GI: Abdomen soft, non-tender, non-distended, with normoactive bowel sounds.  ?Ext: R AKA stump site dry. Left medial LE with well apposed wound edges, no ongoing bleeding or erythema or discharge. Necrotic great toe extending to the forefoot. ?Skin: No new rashes, lesions or ulcers on visualized skin. ?Neuro: Drowsy, rousable, disoriented, blind. ?Psych: Calm this AM.  ? ?Condition at discharge:  Stable for transfer, prognosis certainly < 2 weeks. ? ?The results of significant diagnostics from this hospitalization (including imaging, microbiology, ancillary and laboratory) are listed below for reference.  ? ?Imaging Studies: ?CT HEAD WO CONTRAST (5MM) ? ?Result Date: 02/21/2022 ?CLINICAL DATA:  Altered mental status EXAM: CT HEAD WITHOUT CONTRAST TECHNIQUE: Contiguous axial images were obtained from the base of the skull through the vertex without intravenous contrast. RADIATION DOSE REDUCTION: This exam was performed according to the departmental dose-optimization program which includes automated exposure control, adjustment of  the mA and/or kV according to patient size and/or use of iterative reconstruction technique. COMPARISON:  09/15/2013 FINDINGS: Brain: No evidence of acute infarction, hemorrhage, hydrocephalus, extra-axial collection or mass lesion/mass effect. Chronic white matter ischemic changes are noted. Vascular: No hyperdense vessel or unexpected calcification. Skull: Normal. Negative for fracture or focal lesion. Sinuses/Orbits: No acute finding. Other: None. IMPRESSION: Chronic white matter ischemic change without acute  abnormality. Electronically Signed   By: Inez Catalina M.D.   On: 02/21/2022 21:05  ? ?PERIPHERAL VASCULAR CATHETERIZATION ? ?Result Date: 02/10/2022 ?Images from the original result were not included.   Patient name: Tammy Moses MRN:

## 2022-02-23 NOTE — Progress Notes (Signed)
Called report to [facility]  talked to [employer] ?

## 2022-02-23 NOTE — Progress Notes (Signed)
Contacted [facility] and spoke to [place]. Clinic advised that pt has been transitioned to comfort care and will d/c to [facility] today. Clinic has access to [facility] epic to obtain d/c summary if needed.  ? ?Melven Sartorius ?Renal Navigator ?[phone] ?

## 2022-02-23 NOTE — Progress Notes (Signed)
Pt placed in restraints for high risk of falling out of bed,pt confused will not follow commands,pulling off tele trying to get out of the bed and responding to hallucinations, niece notified. ?

## 2022-02-23 NOTE — Progress Notes (Signed)
PROGRESS NOTE ? ?Brief Narrative: ?Tammy Moses is a 56 y.o. female with a history of ESRD, T2DM, HTN, PVD s/p R AKA Feb 2023, and recent admission 4/13 - 4/28 for left great toe gangrene with left femoropopliteal bypass performed during that admission. She has refused amputation despite that being the recommendation of the surgical and medical team. She was failing to thrive but stable for discharge on 4/28. Unfortunately she continued to do poorly, brought back to ED by her Niece on 4/30 for dialysis. She was admitted but has struggled with agitated delirium, continued refusal of care including dialysis, and showing no interest in taking po.  ? ?I spoke with her Niece who has been her caregiver since recent stints in facilities [facility] [place] then [place]), confirms a chronic nature to debility, cognitive impairment, and malnutrition that has only gotten worse. She agrees that [name]'s prognosis is poor whether she receives dialysis or not. She will have the patient's mother come to bedside for full goals of care discussions, though seems to think that the result of honoring the patient's wishes would entail transition to full comfort measures and transfer to residential hospice. ? ?Subjective: ?Pt has sitter and restraints due to persistent anxiety/agitation. Has refused most care that has been attempted. Denies hunger or pain this AM. She climbed out of bed while still in the ED yesterday.No evident trauma on check. ? ?Objective: ?BP (!) 177/57 (BP Location: Right Arm)   Pulse 76   Temp 98.2 ?F (36.8 ?C) (Oral)   Resp 16   Ht 5\' 1"  (1.549 m)   Wt 45.2 kg   SpO2 100%   BMI 18.83 kg/m?   ?Gen: Frail female in no distress ?Pulm: Nonlabored breathing room air ?CV: Regular rate and rhythm. No murmur, rub, or gallop. No JVD, no pitting dependent edema. ?GI: Abdomen soft, non-tender, non-distended, with normoactive bowel sounds.  ?Ext: R AKA stump site dry. Left medial LE with well apposed wound  edges, no ongoing bleeding or erythema or discharge. Necrotic great toe extending to the forefoot. ?Skin: No new rashes, lesions or ulcers on visualized skin. ?Neuro: Drowsy, rousable, disoriented, blind. ?Psych: Calm this AM.  ? 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 ?Overall, this patient's clinical status is not going to improve regardless of interventions performed at this time. She has insistently declined dialysis (even when she was clear-minded her Niece found out dialysis stopped calling transport due to no-shows last year), and continues to do so now. She has recently declined the only definitive therapy available to her for gangrene (amputation), and is showing evidence of transitioning toward end of life with not taking fluids/nutrition. I have shared my concern that we are now putting the patient through hospitalization and all its risks and invasive interventions despite medical futility. I suggested stopping medications and interventions that are not purely aimed at comfort, peace, dignity and transitioning to residential hospice. Niece agrees and suspects her Aunt would be amenable to that were she to be of sound mind at this time. [name] will come to the hospital to discuss in person. I've asked palliative care to also engage in discussions and notified nephrology that  dialysis will be no longer required.  ? ?Vance Gather, MD ?02/23/2022 12:21 PM   ?

## 2022-02-23 NOTE — Progress Notes (Signed)
Pt is now transitioning to comfort care. No further HD. Will sign off.  ? ?Kelly Splinter, MD ?02/23/2022, 4:27 PM ? ? ? ? ?

## 2022-02-24 ENCOUNTER — Other Ambulatory Visit: Payer: Self-pay | Admitting: *Deleted

## 2022-02-24 NOTE — Patient Outreach (Signed)
[facility]) Care Management ? ?02/24/2022 ? ?Melvia Heaps ?September 07, 1966 ?[id number] ? ?Hospitalized discharged on 5/2 to [facility] ? ?Case Closure. ? ?Raina Mina, RN ?Care Management Coordinator ?[facility] ?Main Office [phone]  ?

## 2022-03-12 ENCOUNTER — Encounter: Payer: Medicare Other | Admitting: Vascular Surgery

## 2022-03-25 DEATH — deceased

## 2023-02-02 IMAGING — DX DG CHEST 1V PORT
1 series · 1 of 1 positions shown · non-contrast
Comparison: December 10, 2020.

CLINICAL DATA: Sepsis.

EXAM:
PORTABLE CHEST 1 VIEW

[chest]
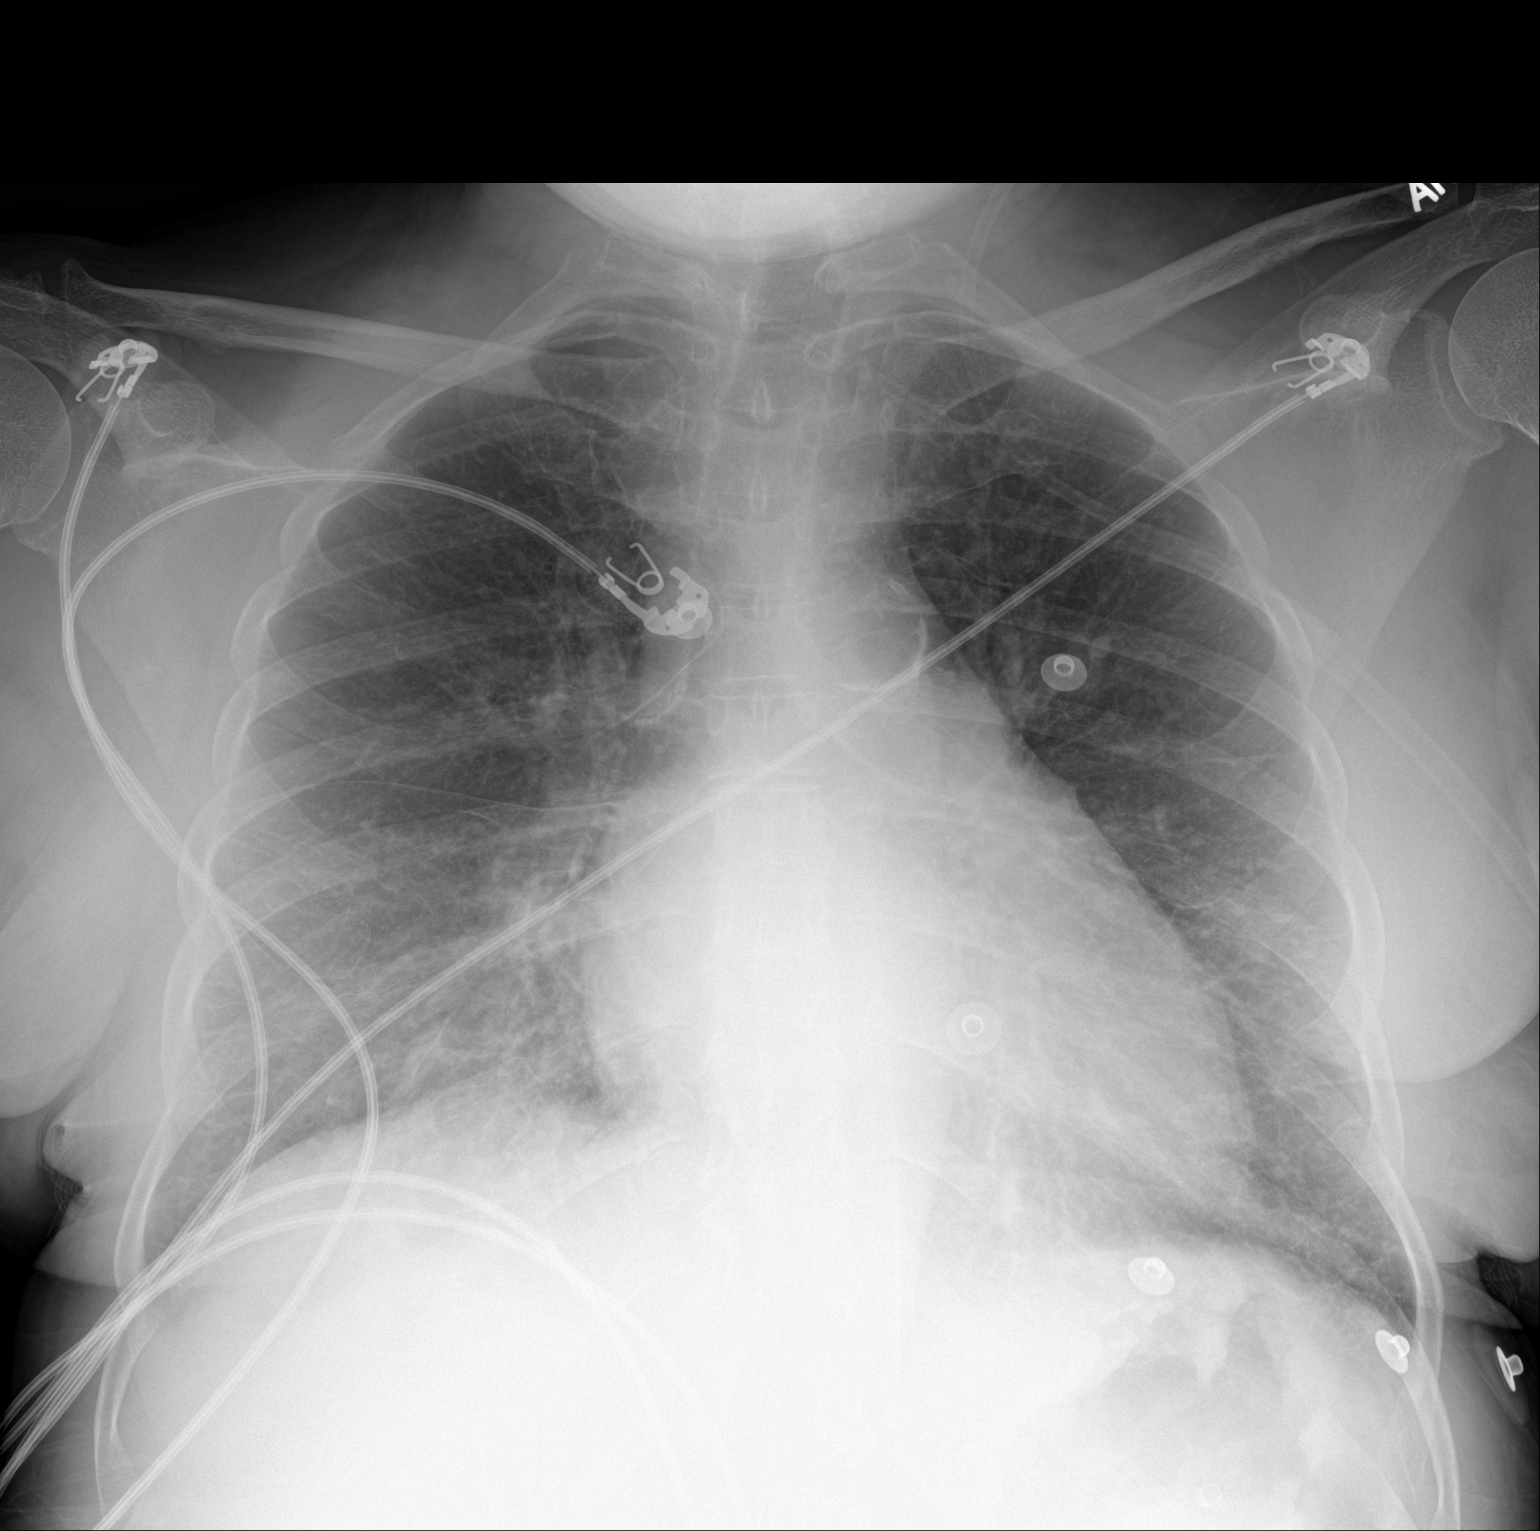

[1 of 1 positions shown; findings below may reference images not displayed]

FINDINGS: The heart size and mediastinal contours are within normal limits.
Both lungs are clear. The visualized skeletal structures are
unremarkable.
IMPRESSION: No active disease.

Aortic Atherosclerosis (QCSDX-0HA.A).

## 2023-02-06 IMAGING — DX DG CHEST 2V
2 series · 2 of 2 positions shown · non-contrast
Comparison: 09/16/2021

CLINICAL DATA: Fever, diabetes, hypertension

EXAM:
CHEST - 2 VIEW

[w chest lat]
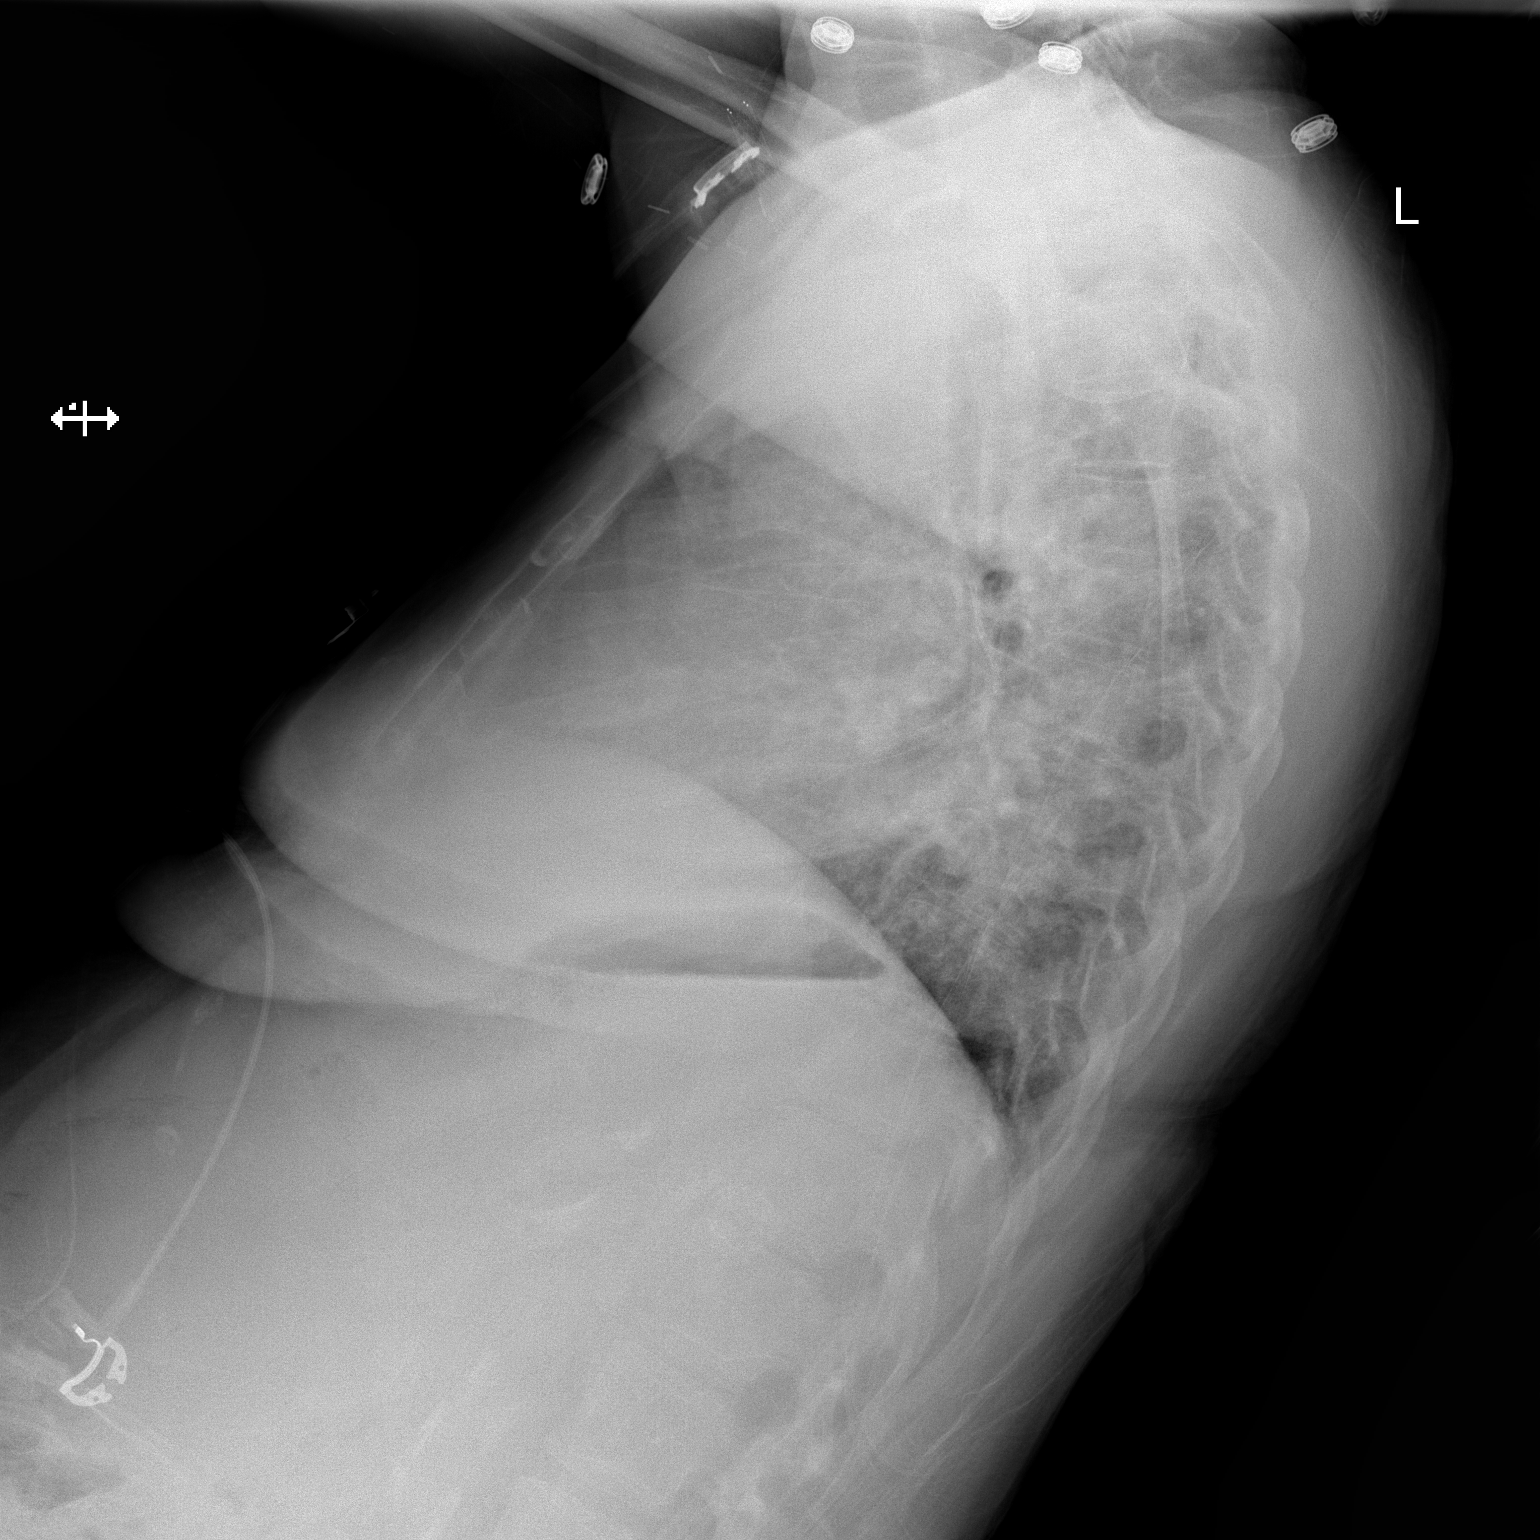

[x chest ap]
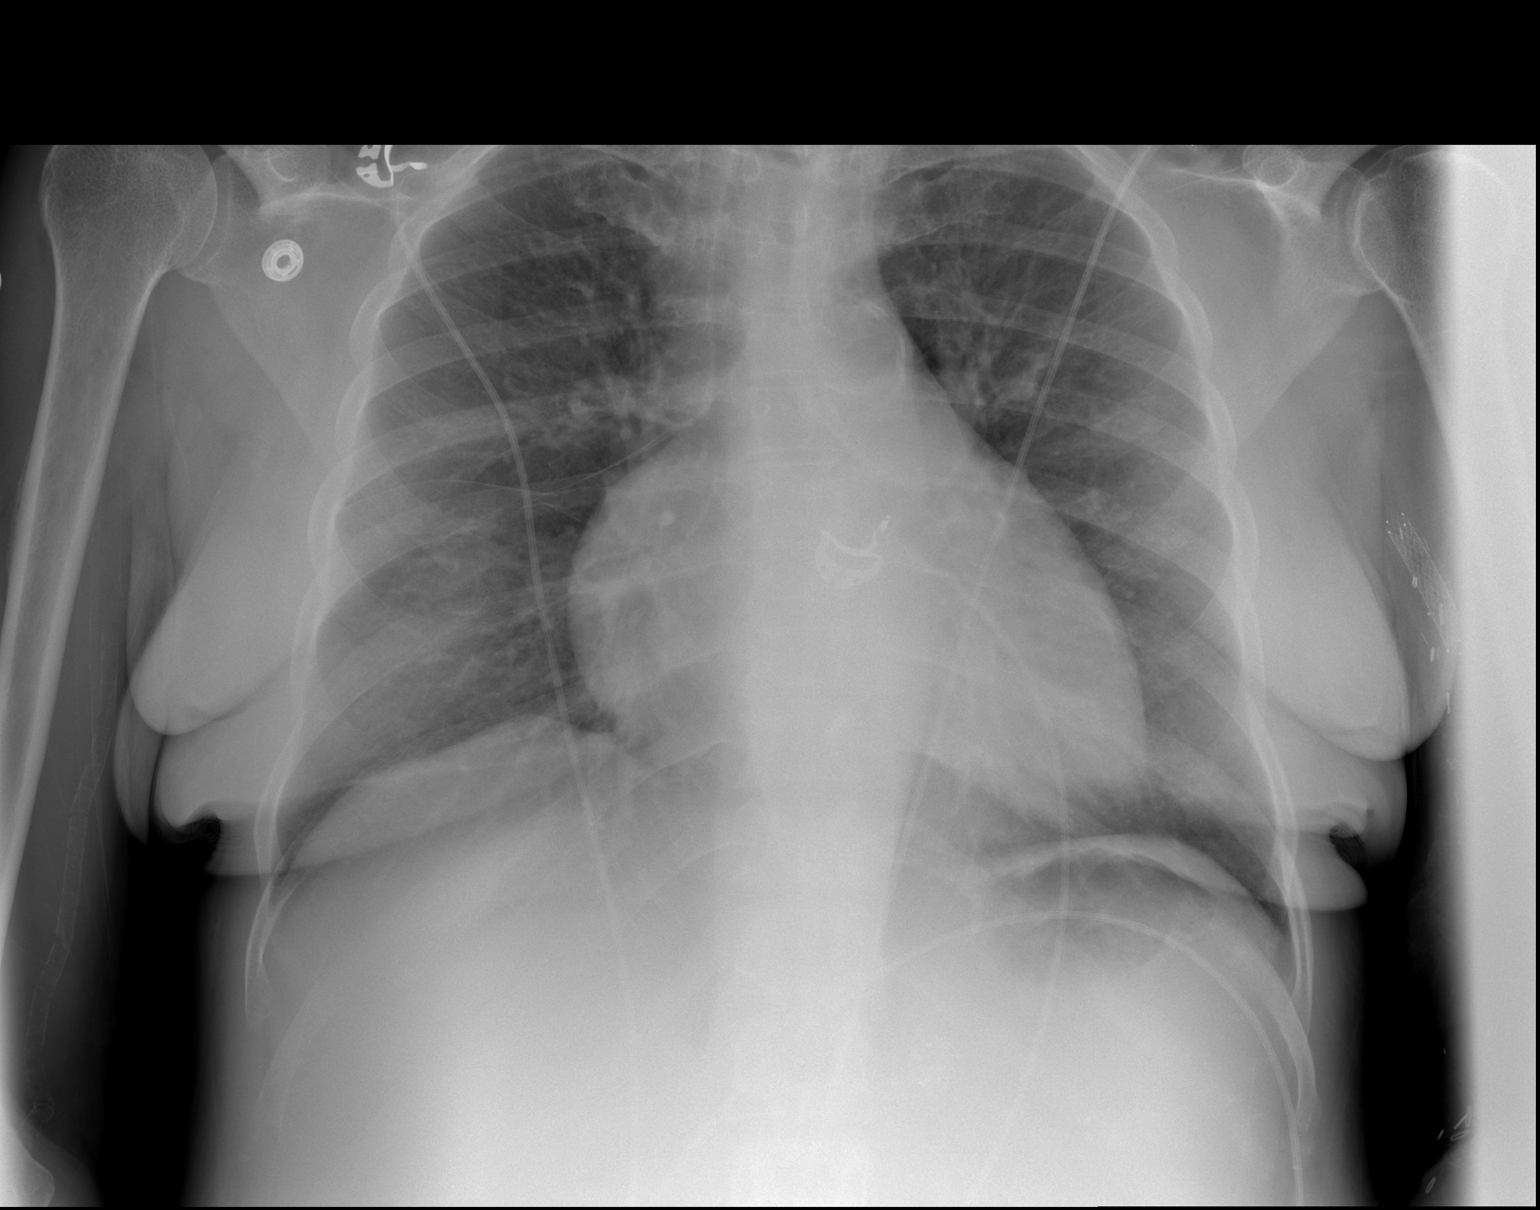

[2 of 2 positions shown; findings below may reference images not displayed]

FINDINGS: Enlargement of cardiac silhouette with pulmonary vascular
congestion.

Atherosclerotic calcification aorta.

Lungs clear.

No pulmonary infiltrate, pleural effusion, or pneumothorax.

Osseous demineralization.
IMPRESSION: Enlargement of cardiac silhouette.

No acute abnormalities.

Aortic Atherosclerosis (SIQWH-HSU.U).

## 2023-02-22 IMAGING — XA IR FLUORO GUIDE CV LINE*R*
1 series · 1 of 1 positions shown · non-contrast
Comparison: None.

INDICATION: 55-year-old female with history of end-stage renal disease with
malfunctioning upper extremity fistula. The patient elected to
forego NPO status and have temporary hemodialysis catheter placed to
delay fistulagram.

EXAM:
NON-TUNNELED CENTRAL VENOUS HEMODIALYSIS CATHETER PLACEMENT WITH
ULTRASOUND AND FLUOROSCOPIC GUIDANCE

[Series 1: single · 1 of 1 slices shown]
[im 1/1]
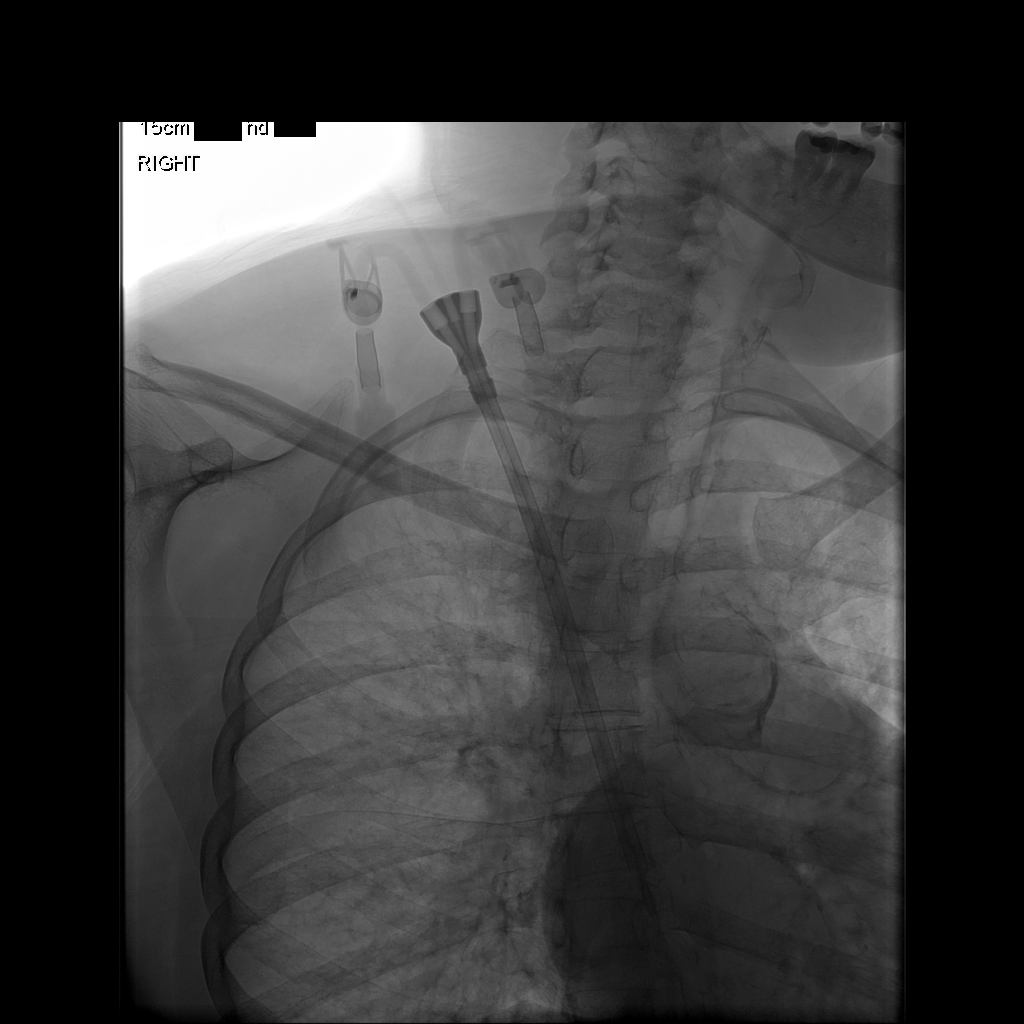

[1 of 1 positions shown; findings below may reference images not displayed]

MEDICATIONS:
None

FLUOROSCOPY TIME:  0 minutes, 24 seconds (2 mGy)

COMPLICATIONS:
None immediate.



After the overlying soft tissues were anesthetized, a small venotomy
incision was created and a micropuncture kit was utilized to access
the internal jugular vein. Real-time ultrasound guidance was
utilized for vascular access including the acquisition of a
permanent ultrasound image documenting patency of the accessed
vessel. The microwire was utilized to measure appropriate catheter
length.

A Rosen wire was advanced to the level of the IVC. Under
fluoroscopic guidance, the venotomy was serially dilated, ultimately
allowing placement of a 15 cm temporary Trialysis catheter with tip
ultimately terminating within the superior aspect of the right
atrium. Final catheter positioning was confirmed and documented with
a spot radiographic image. The catheter aspirates and flushes
normally. The catheter was flushed with appropriate volume heparin
dwells.

The catheter exit site was secured with a 0 silk retention suture. A
dressing was placed. The patient tolerated the procedure well
without immediate post procedural complication.
IMPRESSION: Successful placement of a right internal jugular approach 15 cm
temporary dialysis catheter with tip terminating with in the
superior aspect of the right atrium. The catheter is ready for
immediate use.

PLAN:
This catheter may be converted to a tunneled dialysis catheter at a
later date as indicated. Plan for fistulagram as originally
requested this admission.
